# Patient Record
Sex: Female | Born: 1940 | Race: White | Hispanic: No | Marital: Married | Smoking: Former smoker
Health system: Southern US, Community
[De-identification: ages and names within clinical notes are randomized; demographics above are authoritative.]

## PROBLEM LIST (undated history)

## (undated) DIAGNOSIS — N189 Chronic kidney disease, unspecified: Secondary | ICD-10-CM

## (undated) DIAGNOSIS — H269 Unspecified cataract: Secondary | ICD-10-CM

## (undated) DIAGNOSIS — M1990 Unspecified osteoarthritis, unspecified site: Secondary | ICD-10-CM

## (undated) DIAGNOSIS — I429 Cardiomyopathy, unspecified: Secondary | ICD-10-CM

## (undated) DIAGNOSIS — I619 Nontraumatic intracerebral hemorrhage, unspecified: Secondary | ICD-10-CM

## (undated) DIAGNOSIS — F329 Major depressive disorder, single episode, unspecified: Secondary | ICD-10-CM

## (undated) DIAGNOSIS — I8390 Asymptomatic varicose veins of unspecified lower extremity: Secondary | ICD-10-CM

## (undated) DIAGNOSIS — I5032 Chronic diastolic (congestive) heart failure: Secondary | ICD-10-CM

## (undated) DIAGNOSIS — I10 Essential (primary) hypertension: Secondary | ICD-10-CM

## (undated) DIAGNOSIS — D689 Coagulation defect, unspecified: Secondary | ICD-10-CM

## (undated) DIAGNOSIS — E119 Type 2 diabetes mellitus without complications: Secondary | ICD-10-CM

## (undated) DIAGNOSIS — R410 Disorientation, unspecified: Secondary | ICD-10-CM

## (undated) DIAGNOSIS — F32A Depression, unspecified: Secondary | ICD-10-CM

## (undated) DIAGNOSIS — N952 Postmenopausal atrophic vaginitis: Secondary | ICD-10-CM

## (undated) DIAGNOSIS — I509 Heart failure, unspecified: Secondary | ICD-10-CM

## (undated) DIAGNOSIS — J449 Chronic obstructive pulmonary disease, unspecified: Secondary | ICD-10-CM

## (undated) DIAGNOSIS — R350 Frequency of micturition: Secondary | ICD-10-CM

## (undated) DIAGNOSIS — E669 Obesity, unspecified: Secondary | ICD-10-CM

## (undated) DIAGNOSIS — G4730 Sleep apnea, unspecified: Secondary | ICD-10-CM

## (undated) DIAGNOSIS — E079 Disorder of thyroid, unspecified: Secondary | ICD-10-CM

## (undated) DIAGNOSIS — N3946 Mixed incontinence: Secondary | ICD-10-CM

## (undated) DIAGNOSIS — J9621 Acute and chronic respiratory failure with hypoxia: Secondary | ICD-10-CM

## (undated) HISTORY — DX: Frequency of micturition: R35.0

## (undated) HISTORY — DX: Unspecified cataract: H26.9

## (undated) HISTORY — DX: Mixed incontinence: N39.46

## (undated) HISTORY — PX: OTHER SURGICAL HISTORY: SHX169

## (undated) HISTORY — DX: Coagulation defect, unspecified: D68.9

## (undated) HISTORY — DX: Cardiomyopathy, unspecified: I42.9

## (undated) HISTORY — DX: Postmenopausal atrophic vaginitis: N95.2

## (undated) HISTORY — DX: Chronic kidney disease, unspecified: N18.9

## (undated) HISTORY — DX: Asymptomatic varicose veins of unspecified lower extremity: I83.90

## (undated) HISTORY — DX: Obesity, unspecified: E66.9

## (undated) HISTORY — DX: Disorientation, unspecified: R41.0

## (undated) HISTORY — DX: Depression, unspecified: F32.A

## (undated) HISTORY — DX: Acute and chronic respiratory failure with hypoxia: J96.21

## (undated) HISTORY — DX: Chronic obstructive pulmonary disease, unspecified: J44.9

## (undated) HISTORY — PX: APPENDECTOMY: SHX54

## (undated) HISTORY — DX: Nontraumatic intracerebral hemorrhage, unspecified: I61.9

## (undated) HISTORY — PX: JOINT REPLACEMENT: SHX530

## (undated) HISTORY — DX: Unspecified osteoarthritis, unspecified site: M19.90

## (undated) HISTORY — DX: Sleep apnea, unspecified: G47.30

## (undated) HISTORY — PX: STRABISMUS SURGERY: SHX218

## (undated) HISTORY — DX: Major depressive disorder, single episode, unspecified: F32.9

## (undated) HISTORY — PX: TUBAL LIGATION: SHX77

## (undated) HISTORY — DX: Essential (primary) hypertension: I10

## (undated) HISTORY — DX: Disorder of thyroid, unspecified: E07.9

## (undated) HISTORY — DX: Chronic diastolic (congestive) heart failure: I50.32

## (undated) HISTORY — PX: TONSILLECTOMY: SUR1361

## (undated) HISTORY — DX: Type 2 diabetes mellitus without complications: E11.9

---

## 2005-01-25 ENCOUNTER — Ambulatory Visit (HOSPITAL_COMMUNITY): Admission: RE | Admit: 2005-01-25 | Discharge: 2005-01-25 | Payer: Self-pay | Admitting: Physician Assistant

## 2010-05-21 ENCOUNTER — Emergency Department: Payer: Self-pay | Admitting: Emergency Medicine

## 2011-03-05 ENCOUNTER — Encounter (HOSPITAL_COMMUNITY)
Admission: RE | Admit: 2011-03-05 | Discharge: 2011-03-05 | Disposition: A | Discharge status: Home / Self Care | Payer: Medicare Other | Source: Ambulatory Visit | Attending: Orthopedic Surgery | Admitting: Orthopedic Surgery

## 2011-03-05 LAB — BASIC METABOLIC PANEL
GFR calc non Af Amer: 51 mL/min — ABNORMAL LOW (ref >60)
Potassium: 4 mEq/L (ref 3.5–5.1)
Sodium: 139 mEq/L (ref 135–145)

## 2011-03-05 LAB — APTT: aPTT: 26 seconds (ref 24–37)

## 2011-03-05 LAB — URINALYSIS, ROUTINE W REFLEX MICROSCOPIC
Leukocytes, UA: NEGATIVE
Nitrite: POSITIVE — AB
Specific Gravity, Urine: 1.018 (ref 1.005–1.030)
pH: 7 (ref 5.0–8.0)

## 2011-03-05 LAB — URINE MICROSCOPIC-ADD ON

## 2011-03-05 LAB — ABO/RH: ABO/RH(D): O POS

## 2011-03-05 LAB — CBC
Platelets: 254 10*3/uL (ref 150–400)
RDW: 12.7 % (ref 11.5–15.5)
WBC: 9.8 10*3/uL (ref 4.0–10.5)

## 2011-03-05 LAB — SURGICAL PCR SCREEN: MRSA, PCR: NEGATIVE

## 2011-03-05 LAB — TYPE AND SCREEN

## 2011-03-09 ENCOUNTER — Ambulatory Visit (HOSPITAL_COMMUNITY)
Admission: RE | Admit: 2011-03-09 | Discharge: 2011-03-09 | Disposition: A | Discharge status: Home / Self Care | Payer: Medicare Other | Source: Ambulatory Visit | Attending: Orthopedic Surgery | Admitting: Orthopedic Surgery

## 2011-03-09 ENCOUNTER — Inpatient Hospital Stay (HOSPITAL_COMMUNITY): Payer: Medicare Other

## 2011-03-09 ENCOUNTER — Other Ambulatory Visit (HOSPITAL_COMMUNITY): Payer: Self-pay | Admitting: Orthopedic Surgery

## 2011-03-09 ENCOUNTER — Inpatient Hospital Stay (HOSPITAL_COMMUNITY)
Admission: RE | Admit: 2011-03-09 | Discharge: 2011-03-10 | DRG: 483 | Disposition: A | Discharge status: Home Health | Payer: Medicare Other | Source: Ambulatory Visit | Attending: Orthopedic Surgery | Admitting: Orthopedic Surgery

## 2011-03-09 DIAGNOSIS — I509 Heart failure, unspecified: Secondary | ICD-10-CM | POA: Diagnosis present

## 2011-03-09 DIAGNOSIS — J449 Chronic obstructive pulmonary disease, unspecified: Secondary | ICD-10-CM | POA: Diagnosis present

## 2011-03-09 DIAGNOSIS — M87029 Idiopathic aseptic necrosis of unspecified humerus: Secondary | ICD-10-CM

## 2011-03-09 DIAGNOSIS — X58XXXS Exposure to other specified factors, sequela: Secondary | ICD-10-CM

## 2011-03-09 DIAGNOSIS — Z01811 Encounter for preprocedural respiratory examination: Secondary | ICD-10-CM

## 2011-03-09 DIAGNOSIS — Z01812 Encounter for preprocedural laboratory examination: Secondary | ICD-10-CM

## 2011-03-09 DIAGNOSIS — Z6841 Body Mass Index (BMI) 40.0 and over, adult: Secondary | ICD-10-CM

## 2011-03-09 DIAGNOSIS — Z79899 Other long term (current) drug therapy: Secondary | ICD-10-CM

## 2011-03-09 DIAGNOSIS — J4489 Other specified chronic obstructive pulmonary disease: Secondary | ICD-10-CM | POA: Diagnosis present

## 2011-03-09 DIAGNOSIS — S42309S Unspecified fracture of shaft of humerus, unspecified arm, sequela: Secondary | ICD-10-CM

## 2011-03-09 DIAGNOSIS — IMO0002 Reserved for concepts with insufficient information to code with codable children: Secondary | ICD-10-CM | POA: Diagnosis present

## 2011-03-10 LAB — BASIC METABOLIC PANEL
BUN: 9 mg/dL (ref 6–23)
Calcium: 8.3 mg/dL — ABNORMAL LOW (ref 8.4–10.5)
Creatinine, Ser: 0.85 mg/dL (ref 0.4–1.2)
GFR calc non Af Amer: >60 mL/min (ref >60)
Potassium: 4.5 mEq/L (ref 3.5–5.1)

## 2011-03-10 LAB — CBC
MCV: 95.4 fL (ref 78.0–100.0)
Platelets: 195 10*3/uL (ref 150–400)
RDW: 12.6 % (ref 11.5–15.5)
WBC: 12.3 10*3/uL — ABNORMAL HIGH (ref 4.0–10.5)

## 2011-03-11 NOTE — Op Note (Signed)
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  PROCEDURE PERFORMED:  Right shoulder hemiarthroplasty.  ATTENDING SURGEON:  Jones Broom, MD  ASSISTANTS: 1. Eulas Post, MD 2. Jason Coop, PA-C  ANESTHESIA:  GETA.  COMPLICATIONS:  None.  DRAINS:  One medium Hemovac.  SPECIMENS:  None.  ESTIMATED BLOOD LOSS:  400 mL.  INDICATIONS FOR SURGERY:  The patient is a 69 year old female who suffered a right proximal humerus fracture and was treated nonsurgically.  She went onto have malunion with AVN of the humeral head and had chronic pain, disability.  Her main issue was pain.  She also noted decreased function of the arm.  She was interested in going forward with surgical treatment, try and decrease her pain, and increase function if at all possible.  She understood risks, benefits, and alternatives of the procedure including but not limited to risk of bleeding, infection, damage to neurovascular structures, risk of incomplete pain relief, and potential need for future surgery.  She elected to go forward with surgery.  OPERATIVE FINDINGS:  Examination under anesthesia demonstrated range of motion to -10 degrees external rotation, forward flexion to about 80 degrees.  The lesser tuberosity osteotomy was performed, and the head was resected.  Decision was made to leave the greater tuberosity in its current position despite  its slightly posterior position.  It was felt that osteotomy would lead to potential further complications and that tuberosities were healed and in acceptable position.  A 12 stem with a 44 x 18 head with a 135-degree neck was placed in about 35 degrees retroversion.  Good stability was noted.  The lesser tuberosity osteotomy was repaired at the conclusion of the case under some tension and at the conclusion of the case, her external rotation was to about zero.  The rotator cuff was completely intact.  PROCEDURE:  The patient was identified in the preoperative holding area where I personally marked the operative site after verifying site, side, and procedure with the patient.  She was taken back to the operating room where general anesthesia was induced without complication.  She was placed in the beach-chair position with the head, neck, and all extremities carefully padded and positioned.  Right upper extremity was then prepped and draped in a standard sterile fashion.  The patient did receive 2 g IV Ancef prior to commencement of the procedure.  The appropriate time-out procedure was carried out by myself, the operative staff, and anesthesia staff all verifying site, side, and procedure. The right upper extremity was then prepped and draped in a standard sterile fashion.  A sterile arm holder was  used.  Dr. Dion Saucier also assisted throughout the procedure, and retraction and positioning was imperative to be able to carry out this procedure.  An approximately 10- cm incision was made from the coracoid tip to the midhumeral level at the level of the axilla.  Dissection was carried down through subcutaneous tissues to the level of the cephalic vein, which was taken laterally with the deltoid.  The underlying conjoined tendon was identified.  The subdeltoid space was elevated into the subacromial space, and Cobra retractor was used to bring the deltoid laterally and the pectoralis major  medially.  The upper 1-cm pectoralis major was taken down for exposure.  The lateral border of the conjoined tendon was developed and by finger palpation, I was able to palpate the axillary nerve, which was felt to be in continuity.  Musculocutaneous nerve was not palpated in the operative field.  The Cobra retractor was then placed under the conjoined tendon.  The circumflex humeral vessels at the inferior border of the subscapularis were identified and coagulated. The arm was then internally rotated, biceps tendon was identified and traced into the joint.  It was resected off the supraglenoid tubercle and then was tenodesed to the pectoralis major using #1 Ethibond with two figure-of-eight sutures.  The remainder of the tendon was then cut and discarded.  The bicipital groove was developed, and a large curved osteotome was used to take an osteotomy of the lesser tuberosity with about 8-mm thickness.  The capsulotomy was then advanced around medially exposing the medial shaft.  This was all done taking great care to protect the axillary nerve with retractors.  The joint was then dislocated and a combination of a Cobb and osteotome were used to remove the valgus depressed head.  This was mostly dead bone.  At this point, the canal was established using a reamer and sequentially reamed up from 6-12 mm, which had appropriate cortical contact and was felt to be appropriate size.  A 12 stem was then placed as a trial and several heads were tried to try and determine the appropriate head size.  The 44 x 18 head was felt to be the most appropriate fitting head.  Its height was estimated based on the position of the tuberosity as well as measuring from the upper border of the pectoralis major.  It was placed in about 35 degrees of retroversion.  The joint was reduced and was felt to be in appropriate position.  Therefore, the canal was prepared with pulse lavage and the implant was cemented into  place with about 35 degrees of retroversion.  A 44 x 18 eccentric head was used.  Height was felt to be appropriate.  The joint was then reduced and copiously irrigated with normal saline.  The lesser tuberosity osteotomy was then advanced.  It was noted that it was under quite a bit of tension, therefore, a Cobb elevator as well as finger palpation and some sharp dissection was used to completely free up the subscapularis allowing as much excursion as possible.  It was able to be reduced over the front of the implant to the osteotomy site.  It was under some tension.  The arm was held in internal rotation to allow repair.  The lesser tuberosity osteotomy was then repaired using three #2 fiber wires around the bone tendon junction and through bone tunnels on the greater tuberosity. Anatomic repair was achieved.  One #1 Ethibond was used, and the rotator interval in a figure-of-eight fashion.  At  this point, the rotation was tested and she could come out to neutral external rotation comfortably, but not beyond without significant stress on the repair.  A drain was placed at the subdeltoid space, and the wound again was copiously irrigated with pulse lavage.  It was then closed in layers with 2-0 Vicryl in deep dermal layer, 4-0 Monocryl for skin closure.  Sterile dressings were applied including Steri-Strips, 4 x 4s, ABDs, and tape. The patient was placed in a sling, allowed to awaken from general anesthesia, transferred to stretcher, and taken to the recovery room in stable condition.  POSTOPERATIVE PLAN:  She will be kept overnight and likely two nights for pain control.  She will work on gentle hand, wrist, elbow motion, otherwise will stay in the sling.  She will have Percocet and PCA postoperatively for pain control.     Jones Broom, MD     JC/MEDQ  D:  03/09/2011  T:  03/10/2011  Job:  [id number]  Electronically Signed by Jones Broom  on 03/11/2011 01:19:45 PM

## 2011-04-05 NOTE — Discharge Summary (Signed)
  [name], Sara Pierce                ACCOUNT NO.:  [id number]  MEDICAL RECORD NO.:  [id number]           PATIENT TYPE:  I  LOCATION:  5003                         FACILITY:  [facility]  PHYSICIAN:  Jones Broom, MD    DATE OF BIRTH:  05/01/1941  DATE OF ADMISSION:  03/09/2011 DATE OF DISCHARGE:  03/10/2011                              DISCHARGE SUMMARY   FINAL DIAGNOSES:  Status post right shoulder hemiarthroplasty for proximal humeral malunion with avascular necrosis.  HOSPITAL COURSE:  The patient was admitted March 09, 2011 after undergoing right shoulder hemiarthroplasty.  She recovered well on the floor postoperatively.  Her pain was initially controlled with PCA, subsequently with oral analgesia.  She was seen and evaluated by occupational therapy who worked with her on hand, wrist, elbow motion only.  Her drain was discontinued postoperative day #1.  She was neurovascularly intact at the time of discharge and vital signs were stable.  On March 10, 2011, she was discharged home in stable condition. Her husband was with her.  DISCHARGE INSTRUCTIONS:  She will discontinue dressing postoperative day #2 and can shower postoperative day #5.  She will be discharged with Percocet for pain control.  She will work on hand, wrist, elbow motion only, otherwise will remain in her sling.  She will follow up in 10-14 days for wound check.  She will call or return sooner with any problems or concerns.     Jones Broom, MD     JC/MEDQ  D:  04/02/2011  T:  04/02/2011  Job:  [id number]  Electronically Signed by Jones Broom  on 04/05/2011 01:50:38 PM

## 2011-09-12 ENCOUNTER — Emergency Department: Payer: Self-pay | Admitting: Emergency Medicine

## 2011-10-02 ENCOUNTER — Inpatient Hospital Stay: Payer: Self-pay | Admitting: Internal Medicine

## 2011-10-07 ENCOUNTER — Emergency Department: Payer: Self-pay | Admitting: Internal Medicine

## 2011-12-29 IMAGING — CT CT OF THE RIGHT SHOULDER WITHOUT CONTRAST
2 series · 15 of 27 positions shown, 19 images · non-contrast
Comparison: None

REASON FOR EXAM: right shoulder fx - please do 3d reconstructions
COMMENTS:

PROCEDURE:     CT  - CT SHOULDER RIGHT WO  - May 21, 2010 [DATE]
RESULT:     History: Right shoulder fracture
TECHNIQUE: Multiple axial images obtained of the right shoulder with
sagittal and coronal reformatted images provided. 3-D reformatted images for
surface renderings were performed on a Siemens workstation.

[Series 3: shoulder 3mm · axial · 0.43mm/px · z∈[-85,+71]mm · 10 of 62 slices shown, 13 images]
[im 5/62  soft-tissue]
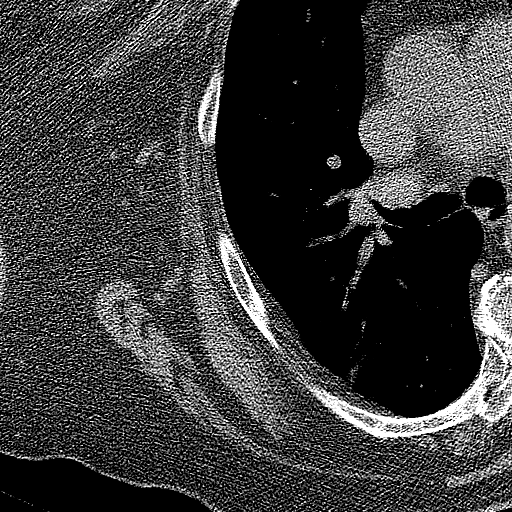
[im 5/62  bone]
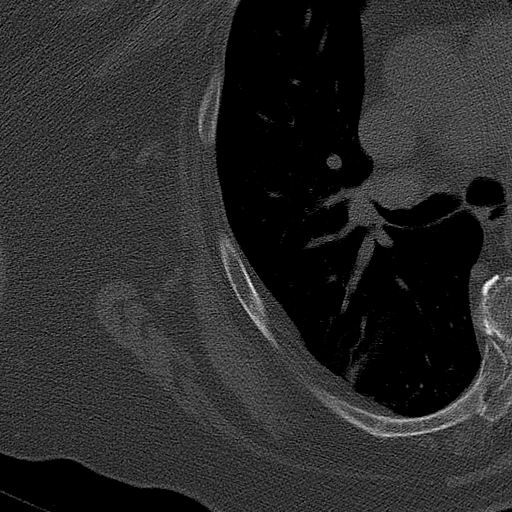
[im 10/62  bone]
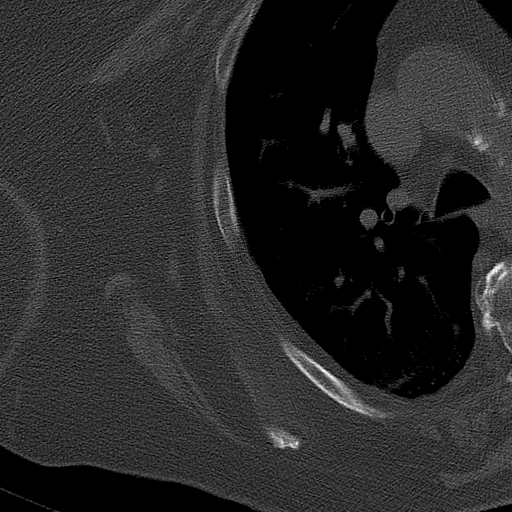
[im 19/62  bone]
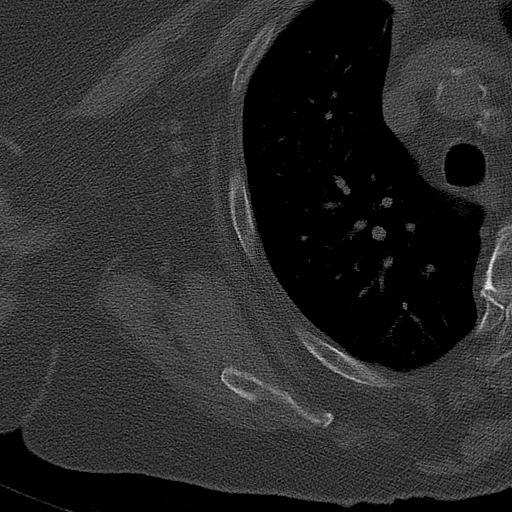
[im 24/62  bone]
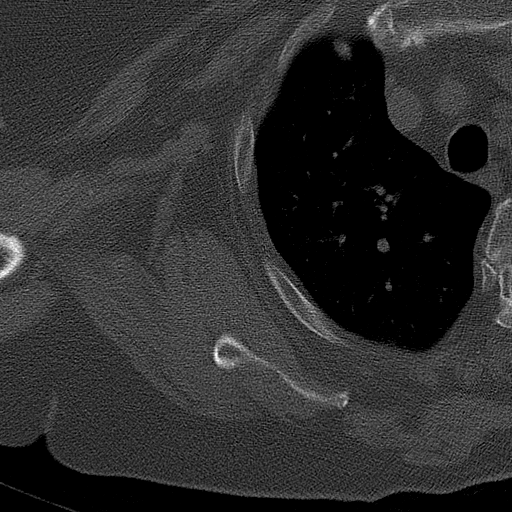
[im 29/62  soft-tissue]
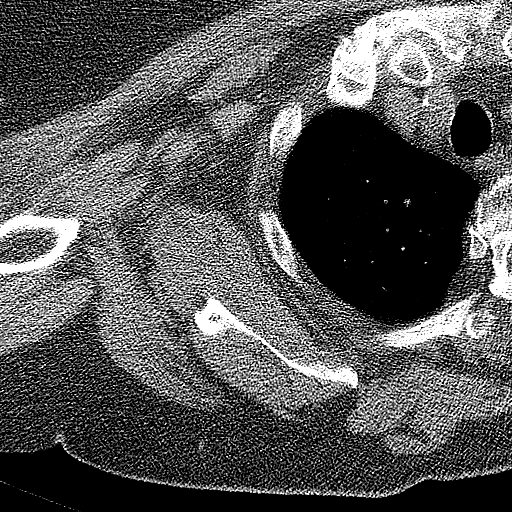
[im 29/62  bone]
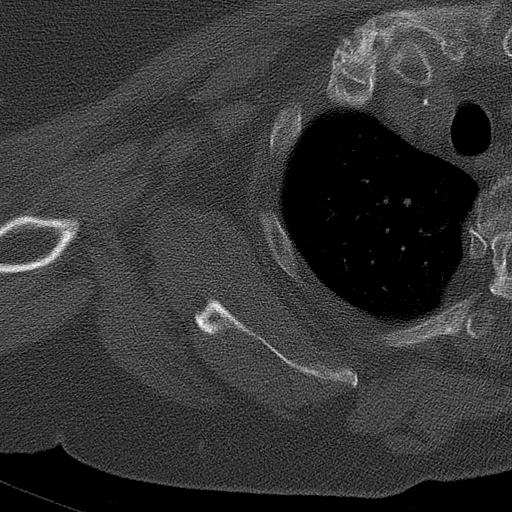
[im 33/62  bone]
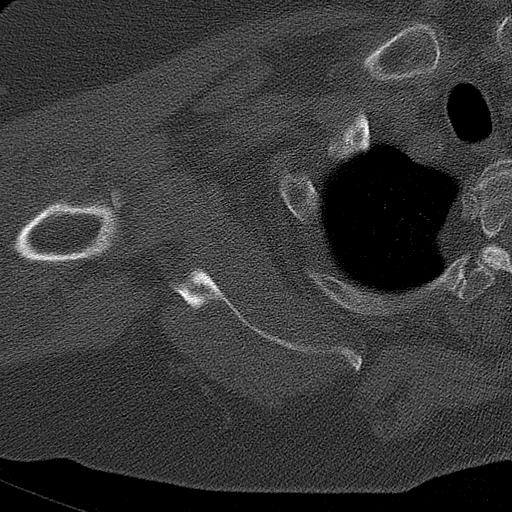
[im 38/62  bone]
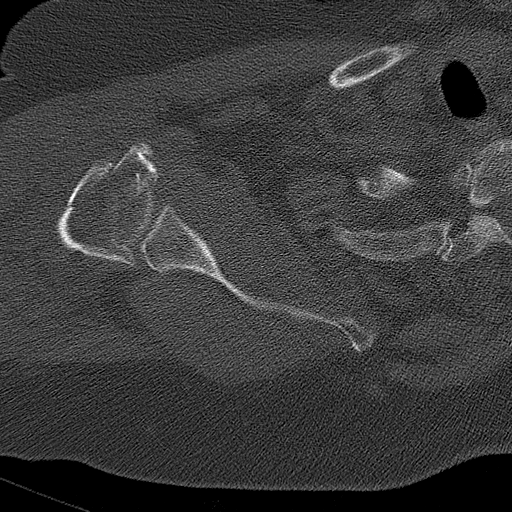
[im 47/62  bone]
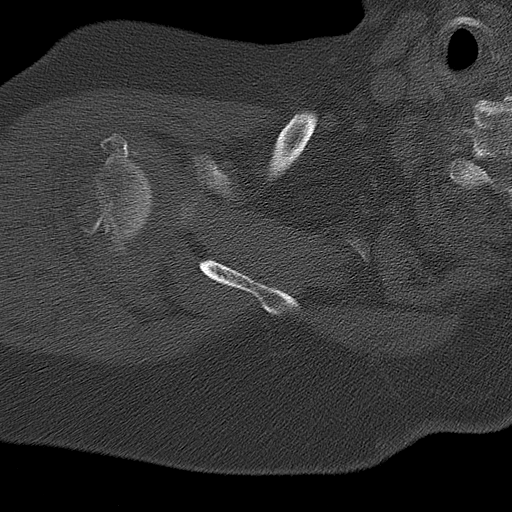
[im 52/62  soft-tissue]
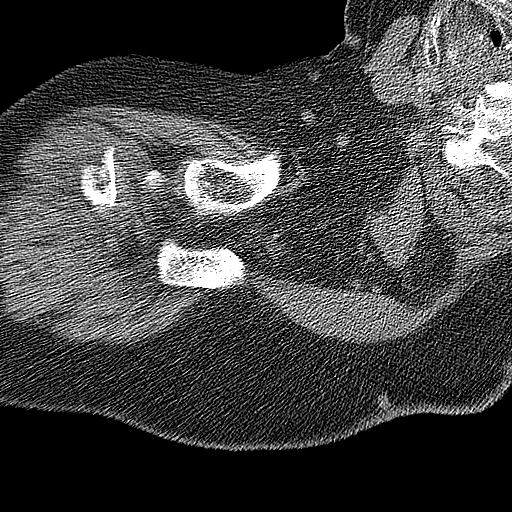
[im 52/62  bone]
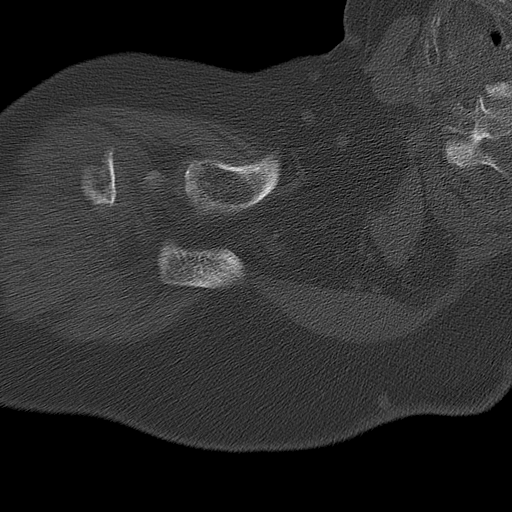
[im 57/62  bone]
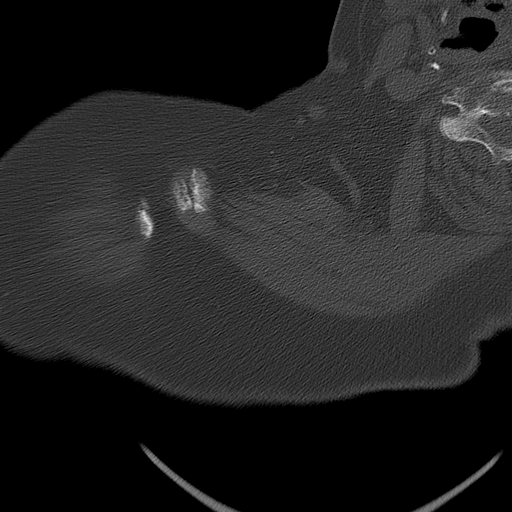

[Series 603: sags · sagittal · 0.45mm/px · 5 of 65 slices shown, 6 images]
[im 22/65  bone]
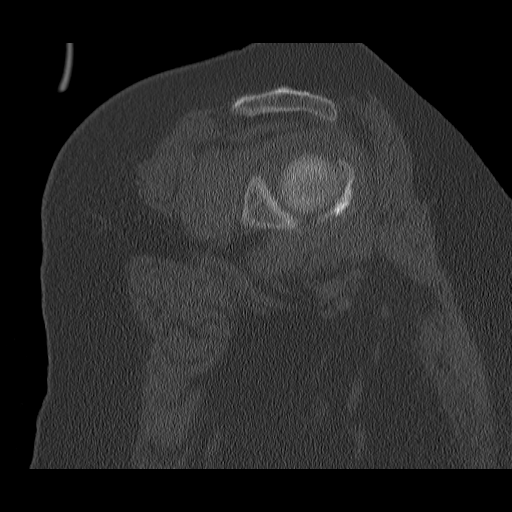
[im 27/65  bone]
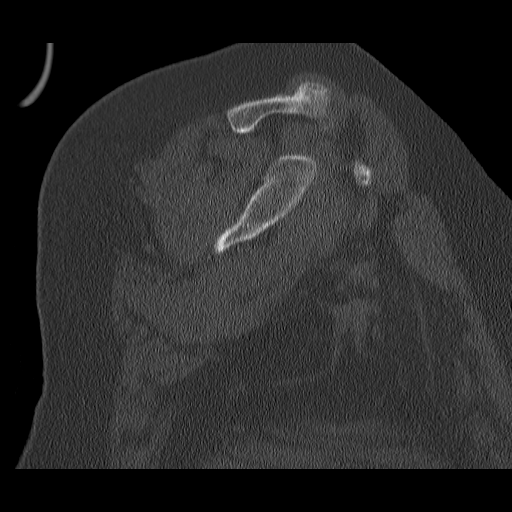
[im 33/65  soft-tissue]
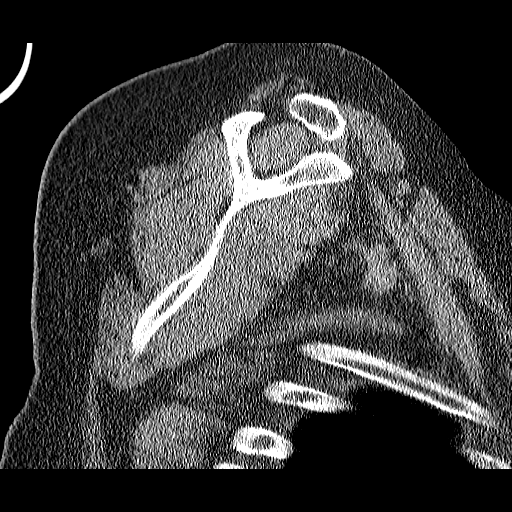
[im 33/65  bone]
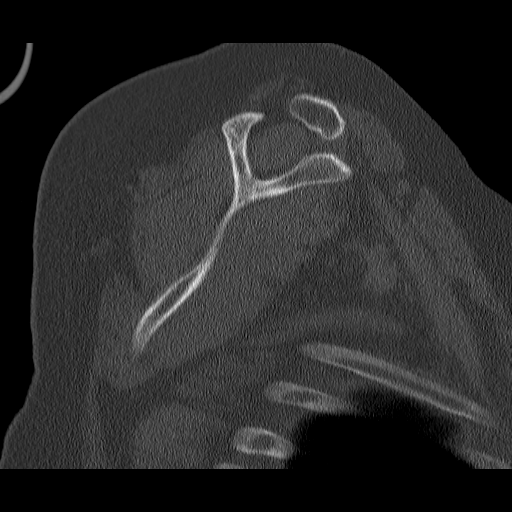
[im 38/65  bone]
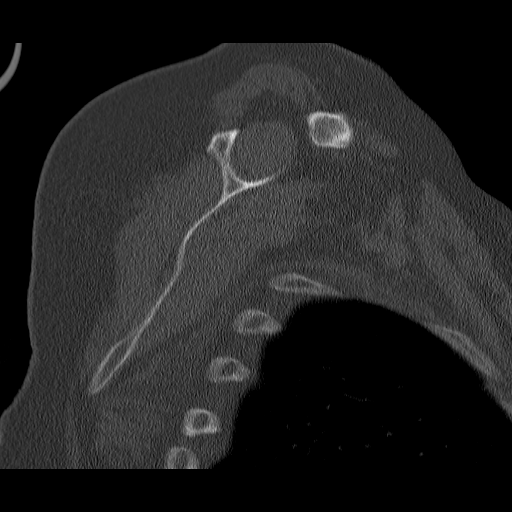
[im 43/65  bone]
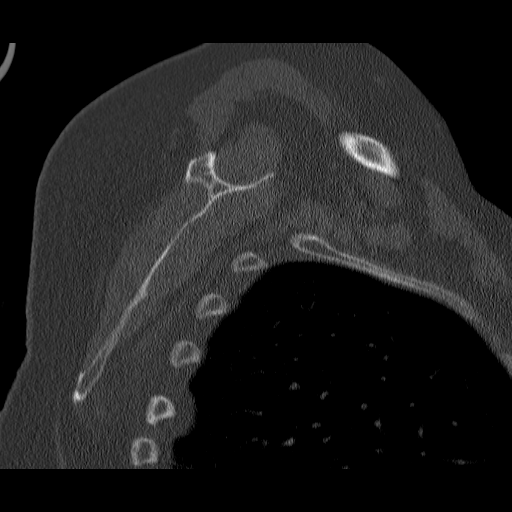

[15 of 27 positions shown; findings below may reference images not displayed]

FINDINGS: There is a comminuted fracture of the right proximal humerus involving the
surgical neck with fracture cleft extending into the greater tuberosity and
lesser tuberosity with mild displacement of the fracture fragments. There is
no glenohumeral dislocation. There is a small hemarthrosis. There are
degenerative changes of the acromioclavicular joint. There is no other
fracture visualized.

The visualized portions of the right lung demonstrate no focal parenchymal
opacity. There is no right pneumothorax.

The visualized portion of the right cervical spine demonstrates lower
cervical degenerative disc disease and right facet arthropathy.
IMPRESSION: Comminuted fracture of the right proximal humerus involving the surgical
neck with extension into the greater tuberosity and lesser tuberosity with
mild displacement of the fracture fragments. There is no glenohumeral
dislocation.

## 2011-12-29 IMAGING — CT CT HEAD WITHOUT CONTRAST
1 series · 1 of 1 positions shown · non-contrast
Comparison: none

REASON FOR EXAM: dizziness
COMMENTS:

PROCEDURE:     CT  - CT HEAD WITHOUT CONTRAST  - May 21, 2010 [DATE]
RESULT:     Comparison:  None
TECHNIQUE: Multiple axial images from the foramen magnum to the vertex were
obtained without IV contrast.

[Series 1: topogram 1.0 t20s · sagittal · 1.0mm · 1.00mm/px · 1 of 1 slices shown]
[im 1/1]
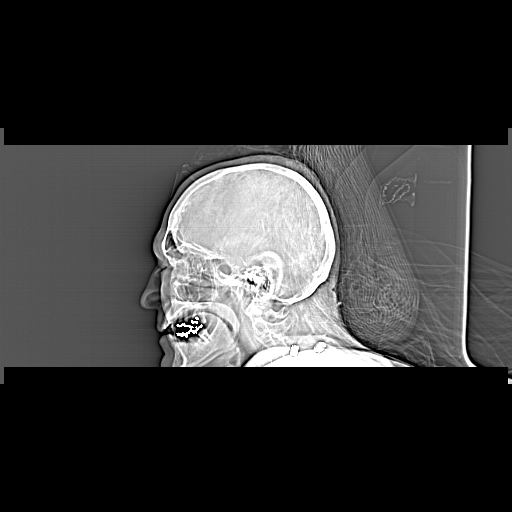

[1 of 1 positions shown; findings below may reference images not displayed]

FINDINGS: There is no evidence of mass effect, midline shift, or extra-axial fluid
collections.  There is no evidence of a space-occupying lesion or
intracranial hemorrhage. There is no evidence of a cortical-based area of
acute infarction. There is a small age indeterminate lacunar infarct in the
right pons. There is generalized cerebral atrophy. There is periventricular
white matter low attenuation likely secondary to microangiopathy.

The ventricles and sulci are appropriate for the patient's age. The basal
cisterns are patent.

Visualized portions of the orbits are unremarkable. The visualized portions
of the paranasal sinuses and mastoid air cells are unremarkable.
Cerebrovascular atherosclerotic calcifications are noted.

The osseous structures are unremarkable.
IMPRESSION: There is a small age indeterminate lacunar infarct in the right pons.
Otherwise no acute intracranial pathology.

CT can underestimate ischemia in the first 24 hours after the event. If
there is clinical concern for an acute infarct, a followup MRI or repeat CT
scan in 24 hours may provide additional information.

## 2012-03-01 ENCOUNTER — Emergency Department: Payer: Self-pay | Admitting: Emergency Medicine

## 2012-03-02 LAB — COMPREHENSIVE METABOLIC PANEL
Anion Gap: 13 (ref 7–16)
BUN: 31 mg/dL — ABNORMAL HIGH (ref 7–18)
Bilirubin,Total: 0.4 mg/dL (ref 0.2–1.0)
Creatinine: 1.75 mg/dL — ABNORMAL HIGH (ref 0.60–1.30)
EGFR (African American): 37 — ABNORMAL LOW
Glucose: 93 mg/dL (ref 65–99)
Osmolality: 282 (ref 275–301)
SGOT(AST): 19 U/L (ref 15–37)
SGPT (ALT): 15 U/L
Sodium: 138 mmol/L (ref 136–145)
Total Protein: 7.3 g/dL (ref 6.4–8.2)

## 2012-03-02 LAB — CBC
MCHC: 33.8 g/dL (ref 32.0–36.0)
Platelet: 241 10*3/uL (ref 150–440)
RDW: 12.9 % (ref 11.5–14.5)
WBC: 13.1 10*3/uL — ABNORMAL HIGH (ref 3.6–11.0)

## 2012-03-02 LAB — PRO B NATRIURETIC PEPTIDE: B-Type Natriuretic Peptide: 180 pg/mL — ABNORMAL HIGH (ref 0–125)

## 2012-03-02 LAB — TROPONIN I: Troponin-I: <0.02 ng/mL

## 2012-05-02 ENCOUNTER — Ambulatory Visit: Payer: Self-pay | Admitting: Family Medicine

## 2012-05-03 ENCOUNTER — Ambulatory Visit: Payer: Self-pay | Admitting: Gastroenterology

## 2012-05-03 ENCOUNTER — Ambulatory Visit: Payer: Self-pay | Admitting: Family Medicine

## 2012-05-05 LAB — PATHOLOGY REPORT

## 2012-10-16 IMAGING — CR DG CHEST 2V
2 series · 2 of 2 positions shown · non-contrast
Comparison: None

CLINICAL DATA: Avascular necrosis right shoulder, preoperative
assessment

CHEST - 2 VIEW

[view not recorded (1 of 2)]
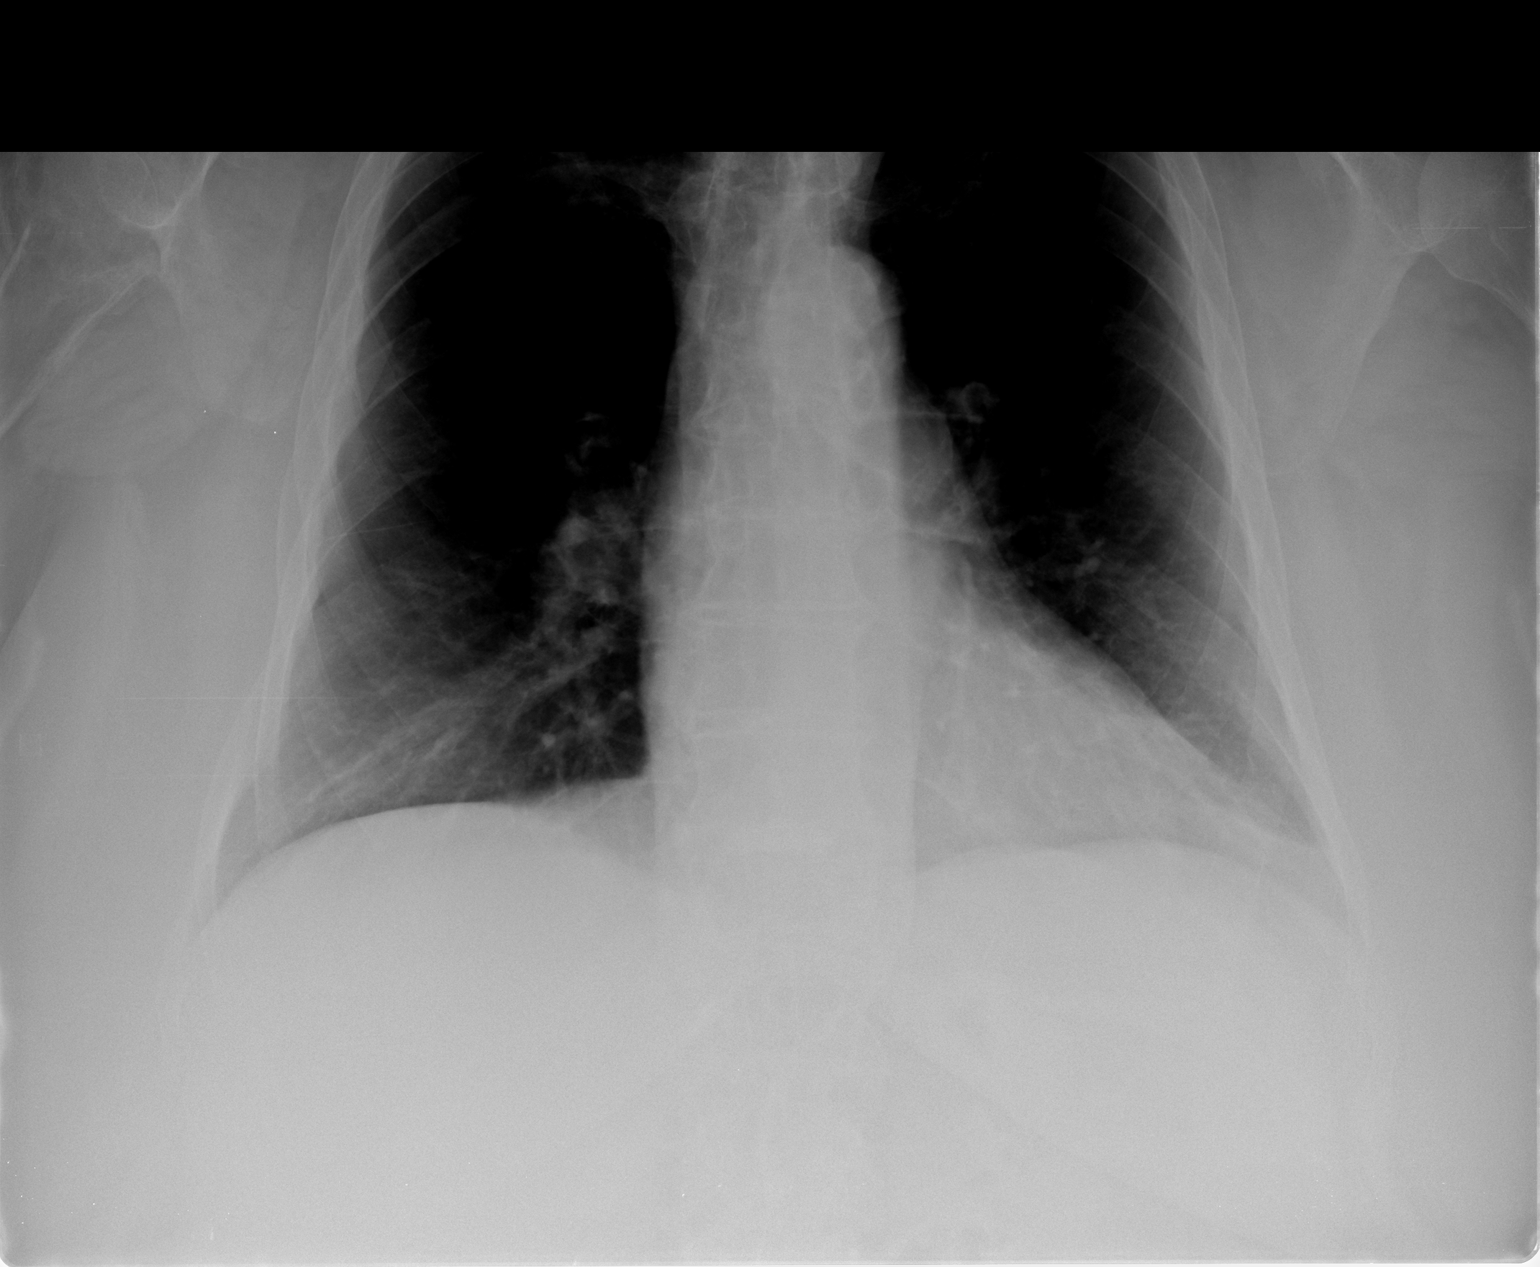

[view not recorded (2 of 2)]
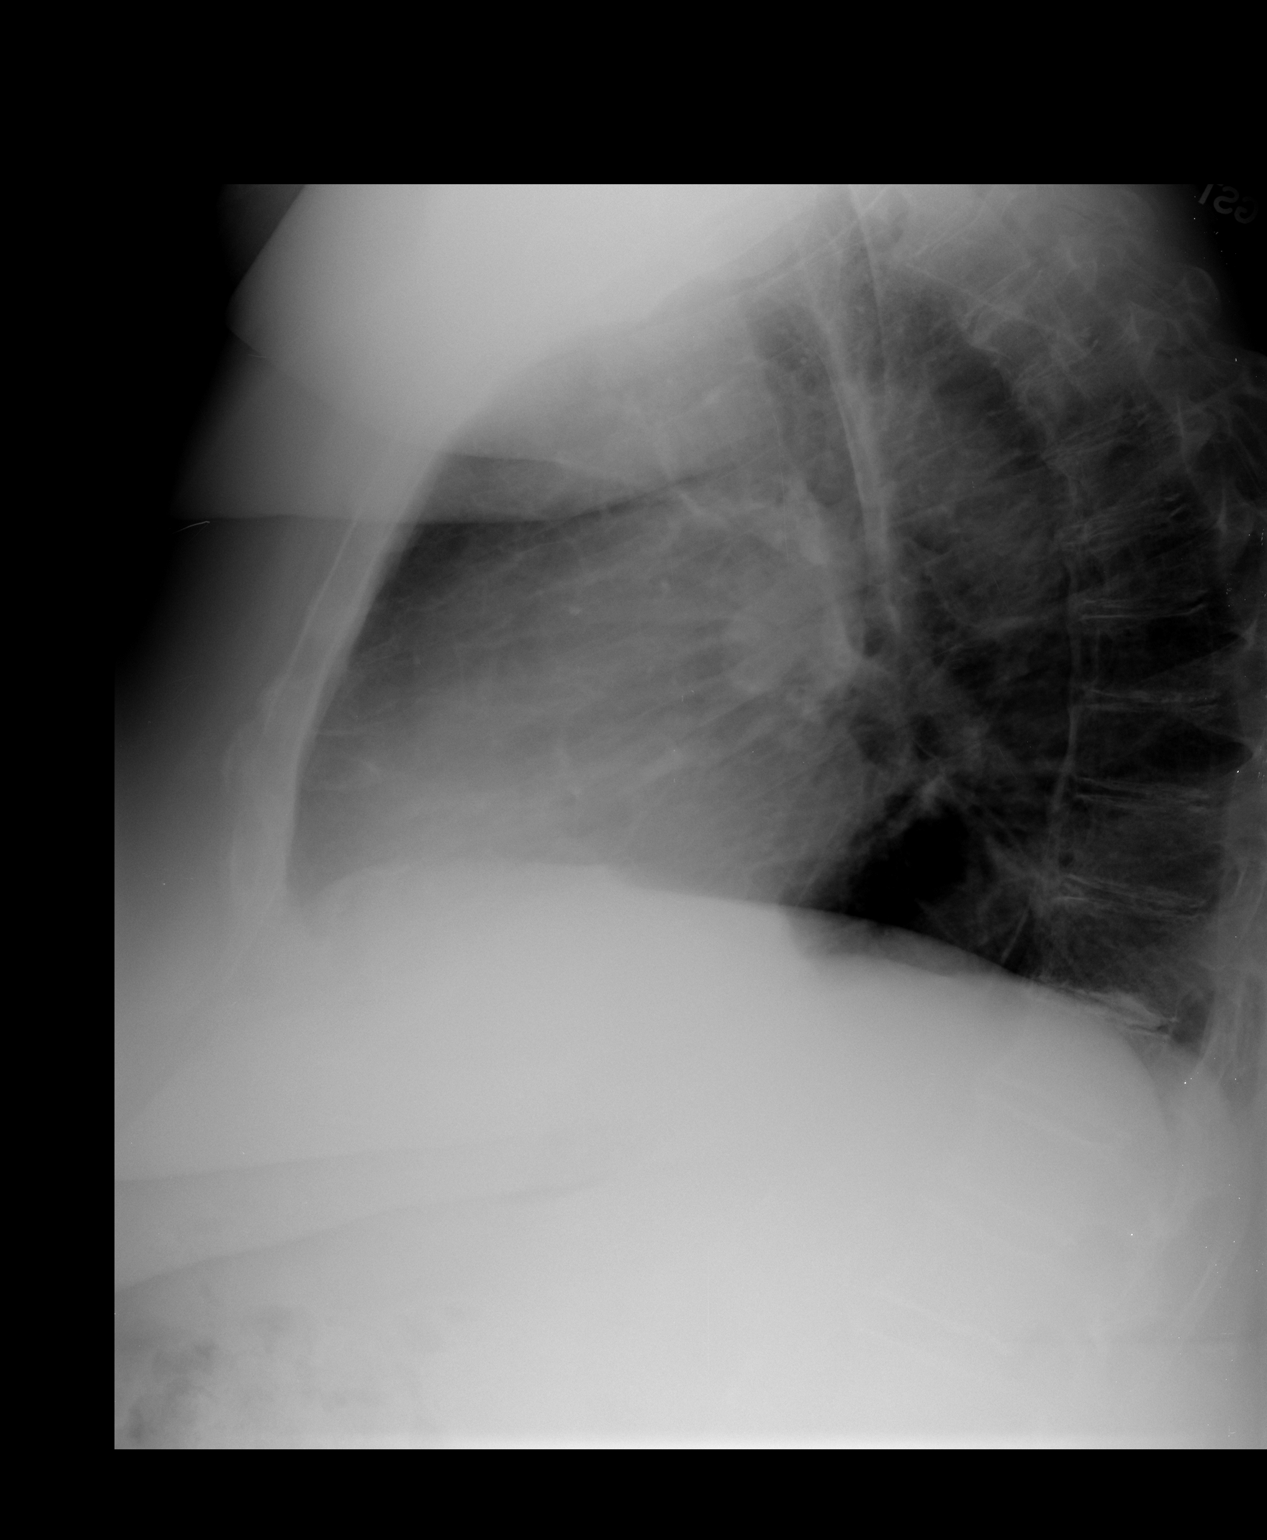

[2 of 2 positions shown; findings below may reference images not displayed]

FINDINGS: Over penetrated exam.
Upper normal heart size.
Normal mediastinal contours and pulmonary vascularity.
Lungs appear emphysematous but clear.
No gross effusion, pneumothorax or mass.
Bones appear demineralized.
Deformity of right humeral head, poorly assessed at margin of
image.
IMPRESSION: Probable COPD without definite acute process identified on an over
penetrated exam.

## 2012-10-16 IMAGING — CR DG SHOULDER 2+V PORT*R*
1 series · 1 of 1 positions shown · non-contrast
Comparison: Chest x-ray of 03/09/2011

CLINICAL DATA: Status post right shoulder arthroplasty

PORTABLE RIGHT SHOULDER - 2+ VIEW

[AP]
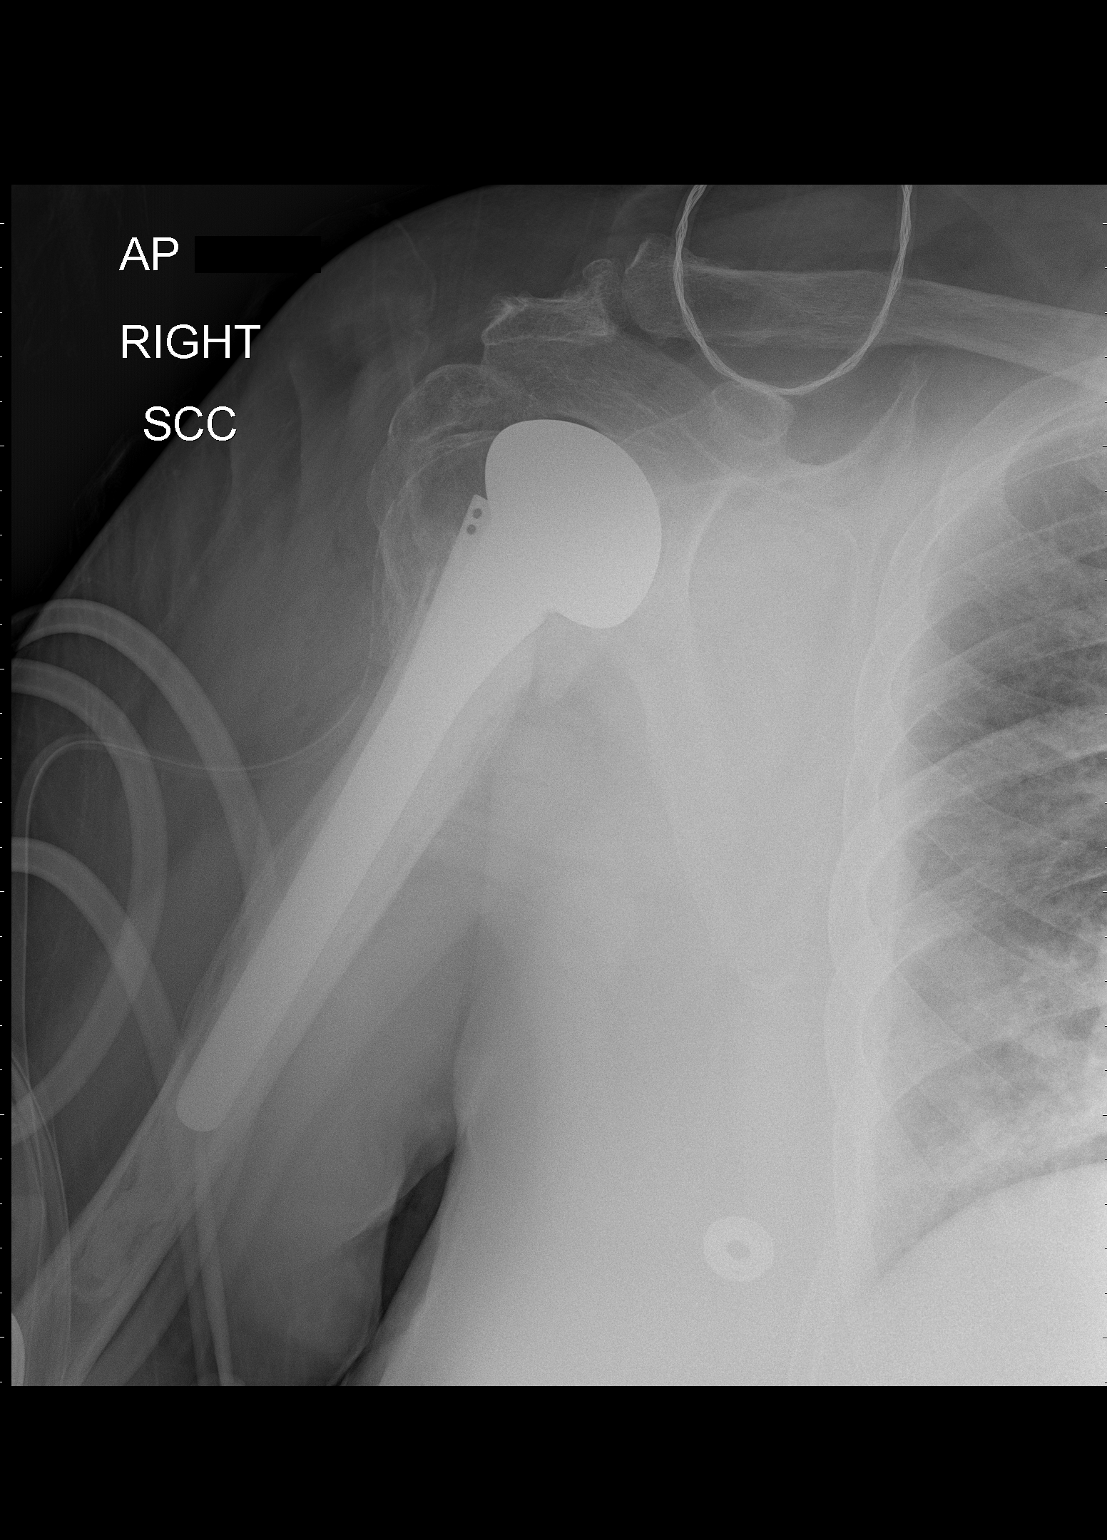

[1 of 1 positions shown; findings below may reference images not displayed]

FINDINGS: A prosthetic right humeral head is noted and appears to
be in good position.  Degenerative change of the right AC joint is
present.  No acute fracture is seen.
IMPRESSION: Right humeral prosthetic head in good position.  No acute
abnormality.

## 2012-11-23 ENCOUNTER — Emergency Department: Payer: Self-pay | Admitting: Emergency Medicine

## 2012-11-23 LAB — COMPREHENSIVE METABOLIC PANEL
Albumin: 3.6 g/dL (ref 3.4–5.0)
Anion Gap: 6 — ABNORMAL LOW (ref 7–16)
BUN: 26 mg/dL — ABNORMAL HIGH (ref 7–18)
Calcium, Total: 9 mg/dL (ref 8.5–10.1)
Chloride: 103 mmol/L (ref 98–107)
Co2: 29 mmol/L (ref 21–32)
EGFR (African American): 52 — ABNORMAL LOW
EGFR (Non-African Amer.): 45 — ABNORMAL LOW
Potassium: 4.2 mmol/L (ref 3.5–5.1)
SGOT(AST): 11 U/L — ABNORMAL LOW (ref 15–37)
SGPT (ALT): 15 U/L (ref 12–78)
Total Protein: 7.3 g/dL (ref 6.4–8.2)

## 2012-11-23 LAB — CBC
HCT: 33.8 % — ABNORMAL LOW (ref 35.0–47.0)
MCV: 95 fL (ref 80–100)
Platelet: 221 10*3/uL (ref 150–440)
RBC: 3.57 10*6/uL — ABNORMAL LOW (ref 3.80–5.20)
RDW: 12.8 % (ref 11.5–14.5)
WBC: 11.9 10*3/uL — ABNORMAL HIGH (ref 3.6–11.0)

## 2012-11-23 LAB — TROPONIN I: Troponin-I: <0.02 ng/mL

## 2013-02-21 ENCOUNTER — Ambulatory Visit: Payer: Self-pay | Admitting: Gastroenterology

## 2013-04-21 IMAGING — CR DG CHEST 2V
1 series · 3 of 3 positions shown · non-contrast
Comparison: none

REASON FOR EXAM: cough
COMMENTS:   May transport without cardiac monitor

PROCEDURE:     DXR - DXR CHEST PA (OR AP) AND LATERAL  - September 12, 2011  [DATE]
RESULT:     Comparison: None

[Series 1: view not recorded · 0.17mm/px · 3 of 3 slices shown]
[im 1/3]
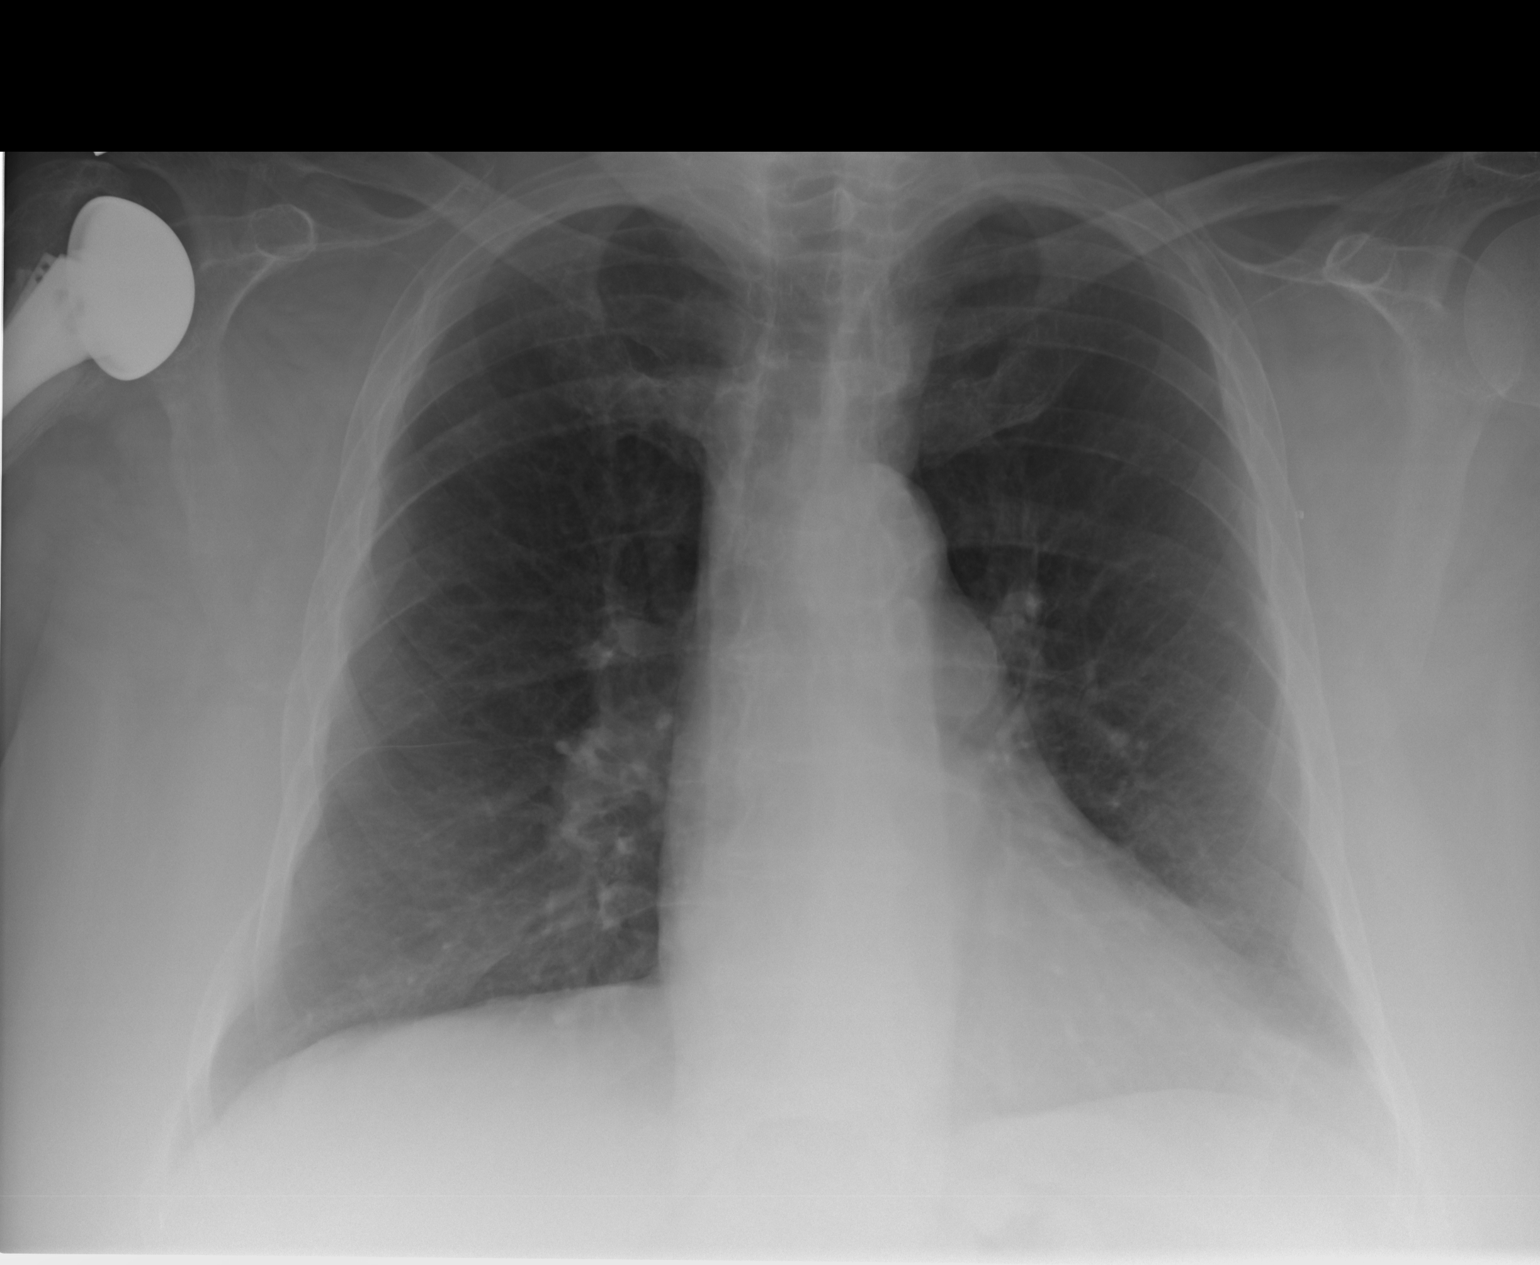
[im 2/3]
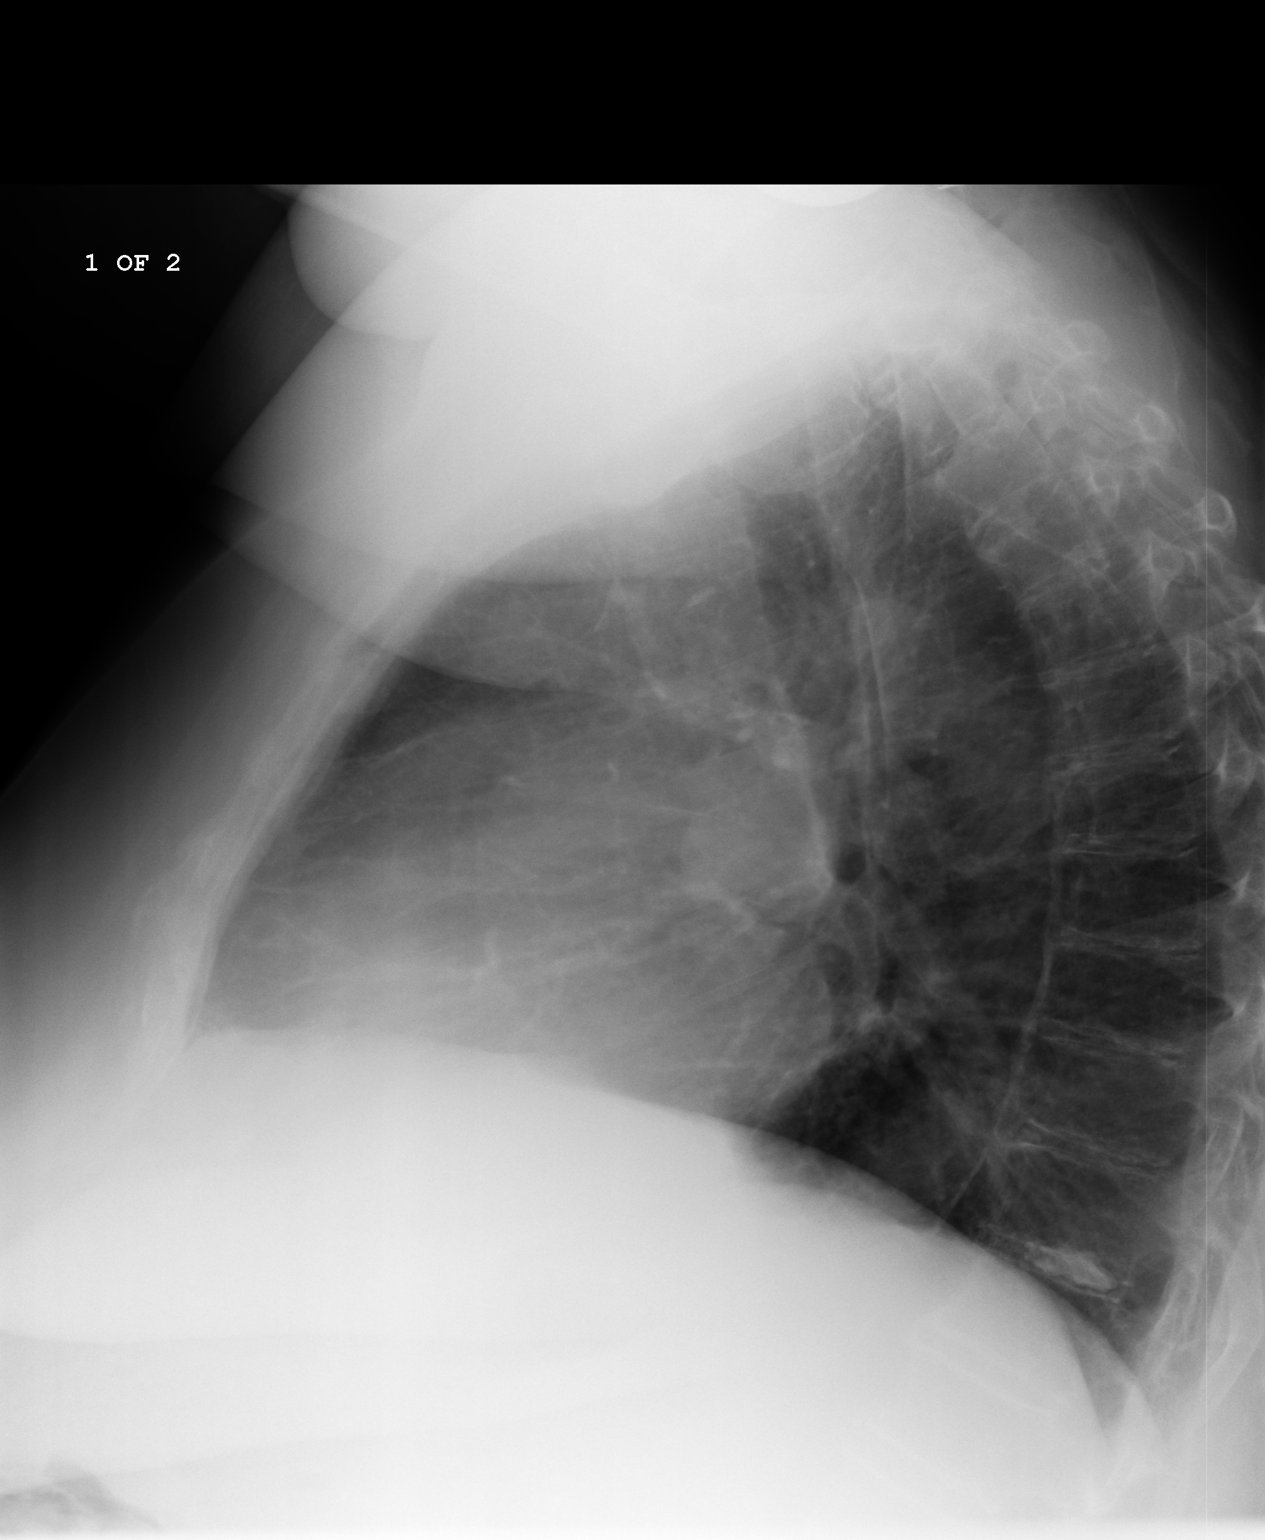
[im 3/3]
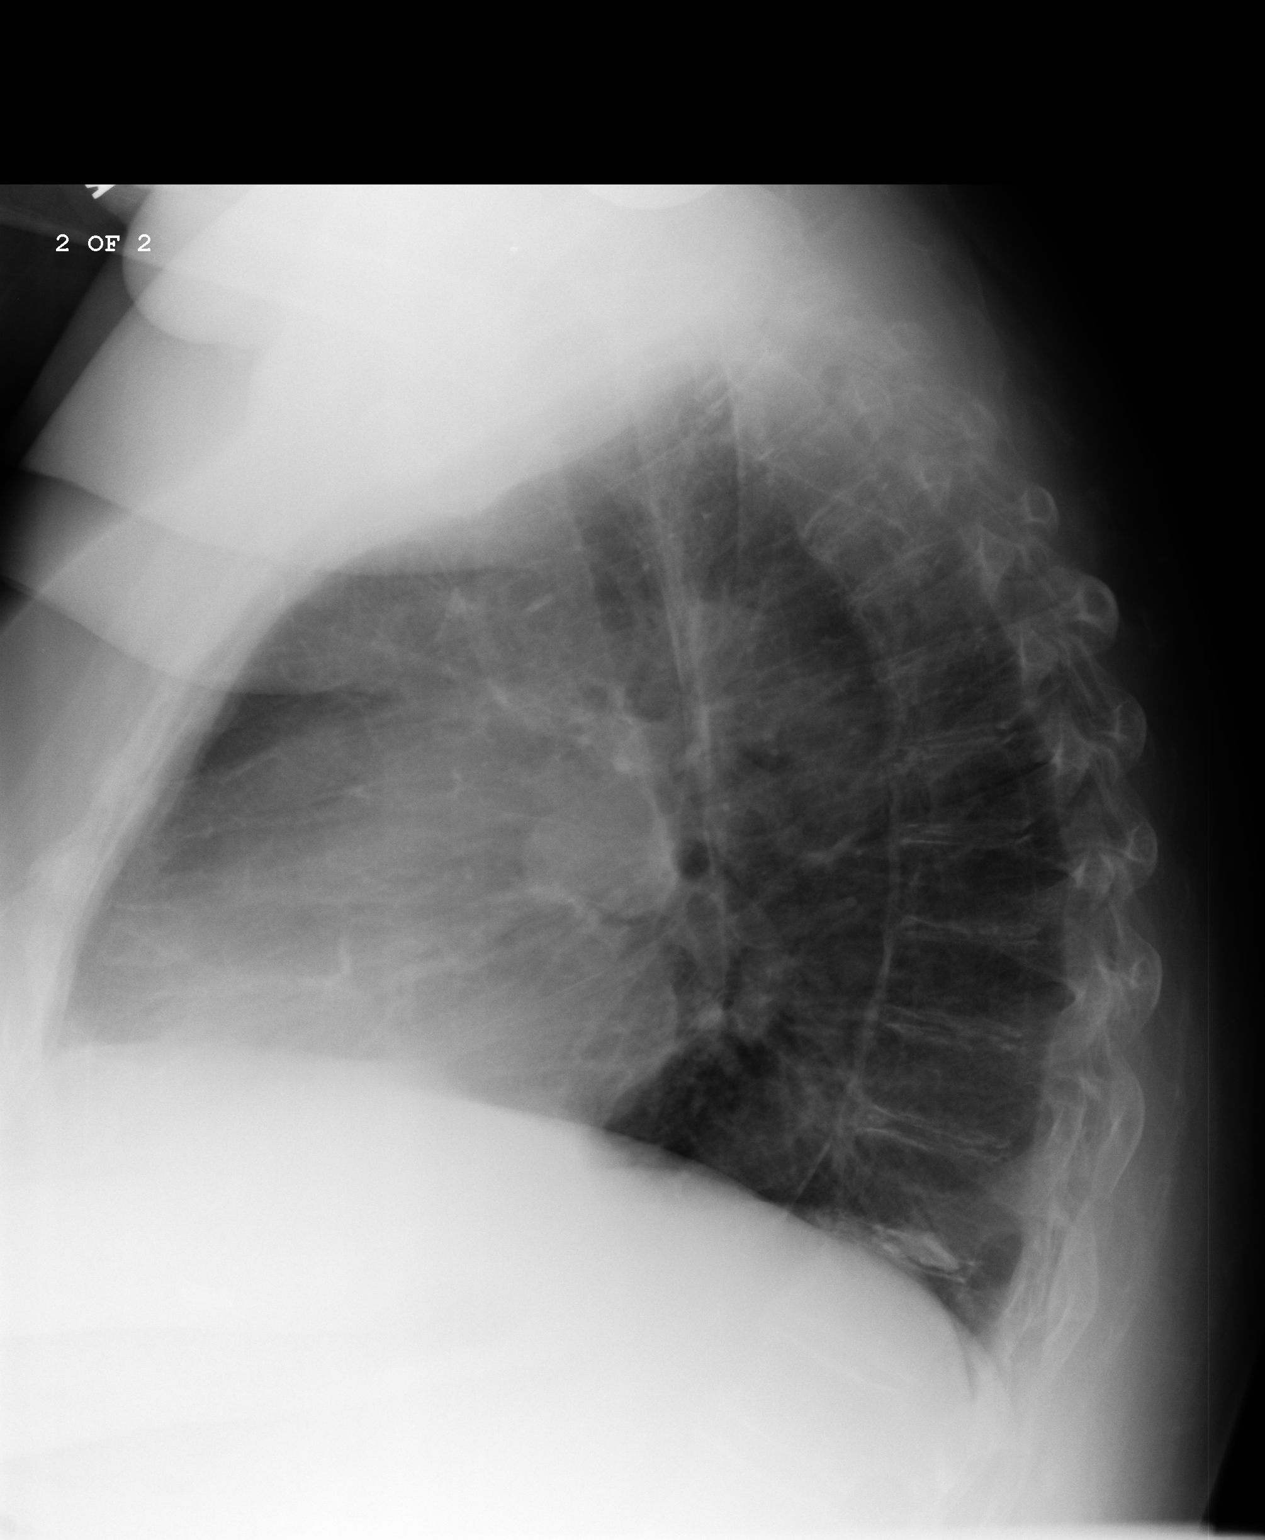

[3 of 3 positions shown; findings below may reference images not displayed]

FINDINGS: PA and lateral chest radiographs are provided.  There is no focal
parenchymal opacity, pleural effusion, or pneumothorax. The heart and
mediastinum are unremarkable. There is a right shoulder arthroplasty.
IMPRESSION: No acute disease of the chest.

## 2013-05-13 IMAGING — CR DG CHEST 2V
1 series · 2 of 2 positions shown · non-contrast
Comparison: none

REASON FOR EXAM: dyspnea
COMMENTS:

[Series 1: view not recorded · 0.17mm/px · 2 of 2 slices shown]
[im 1/2]
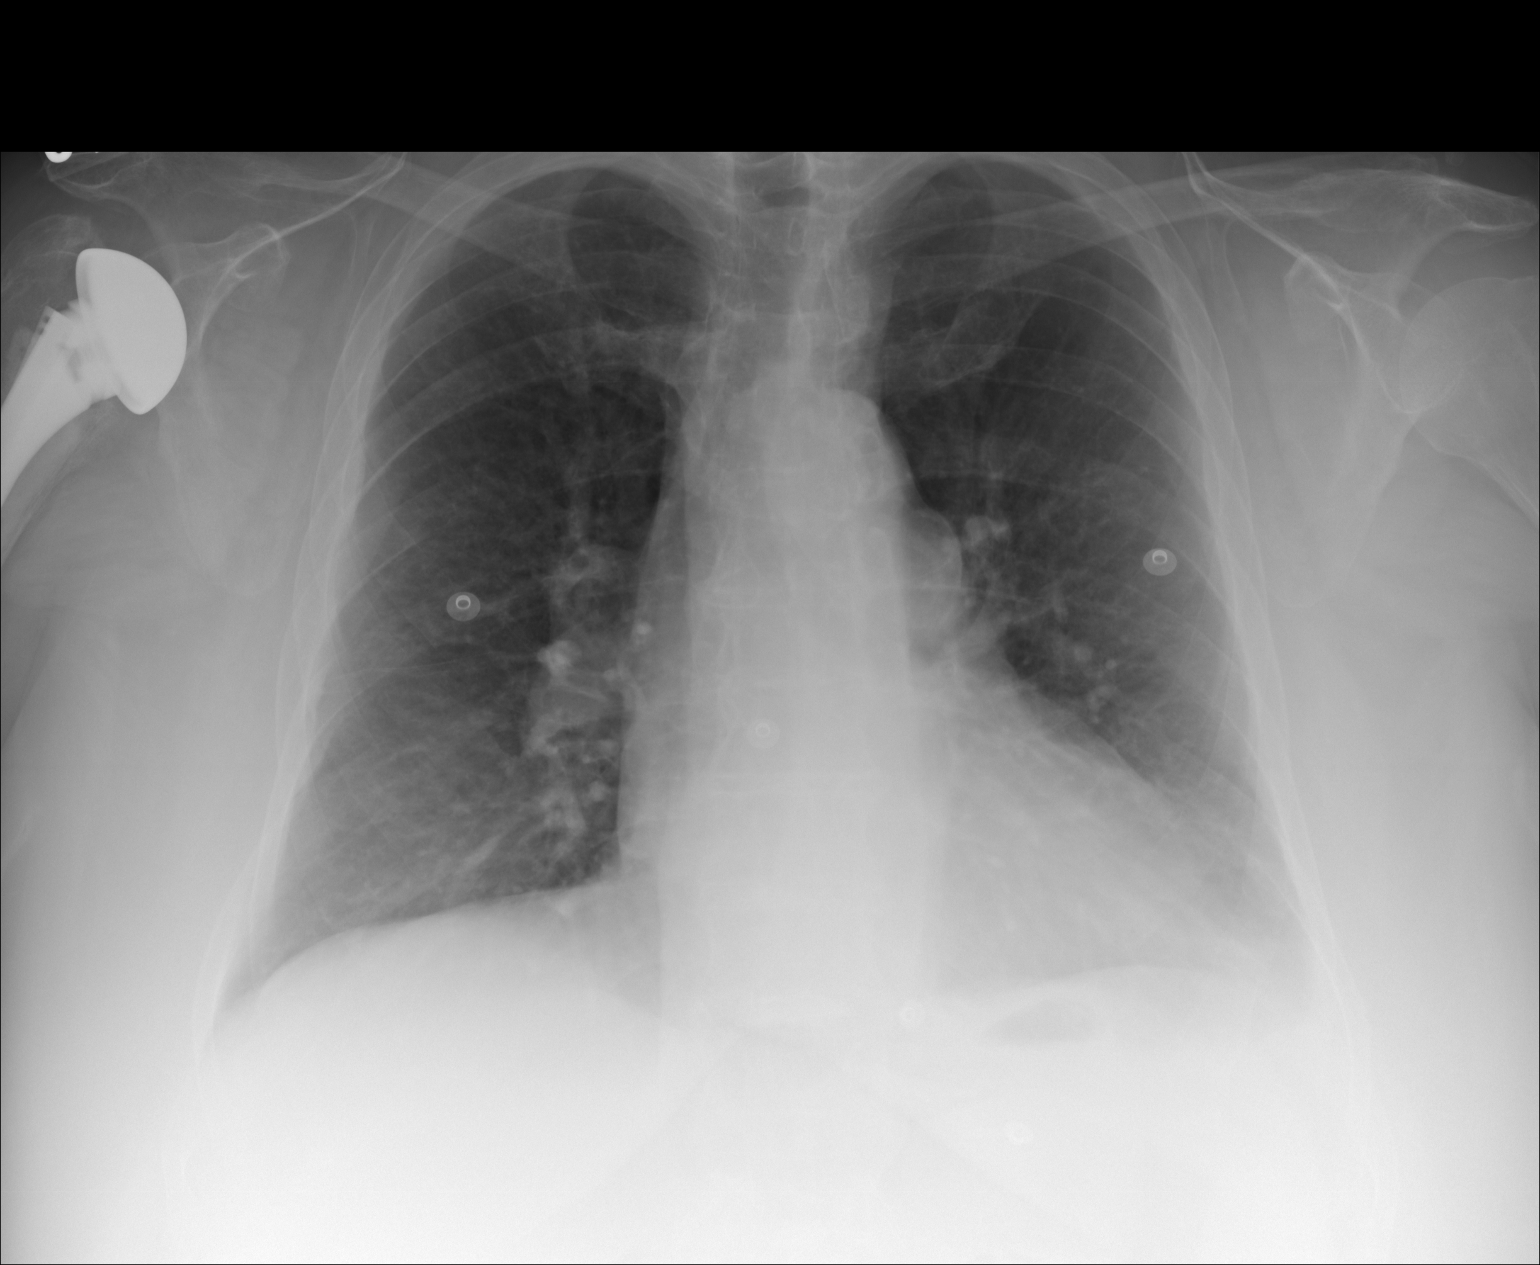
[im 2/2]
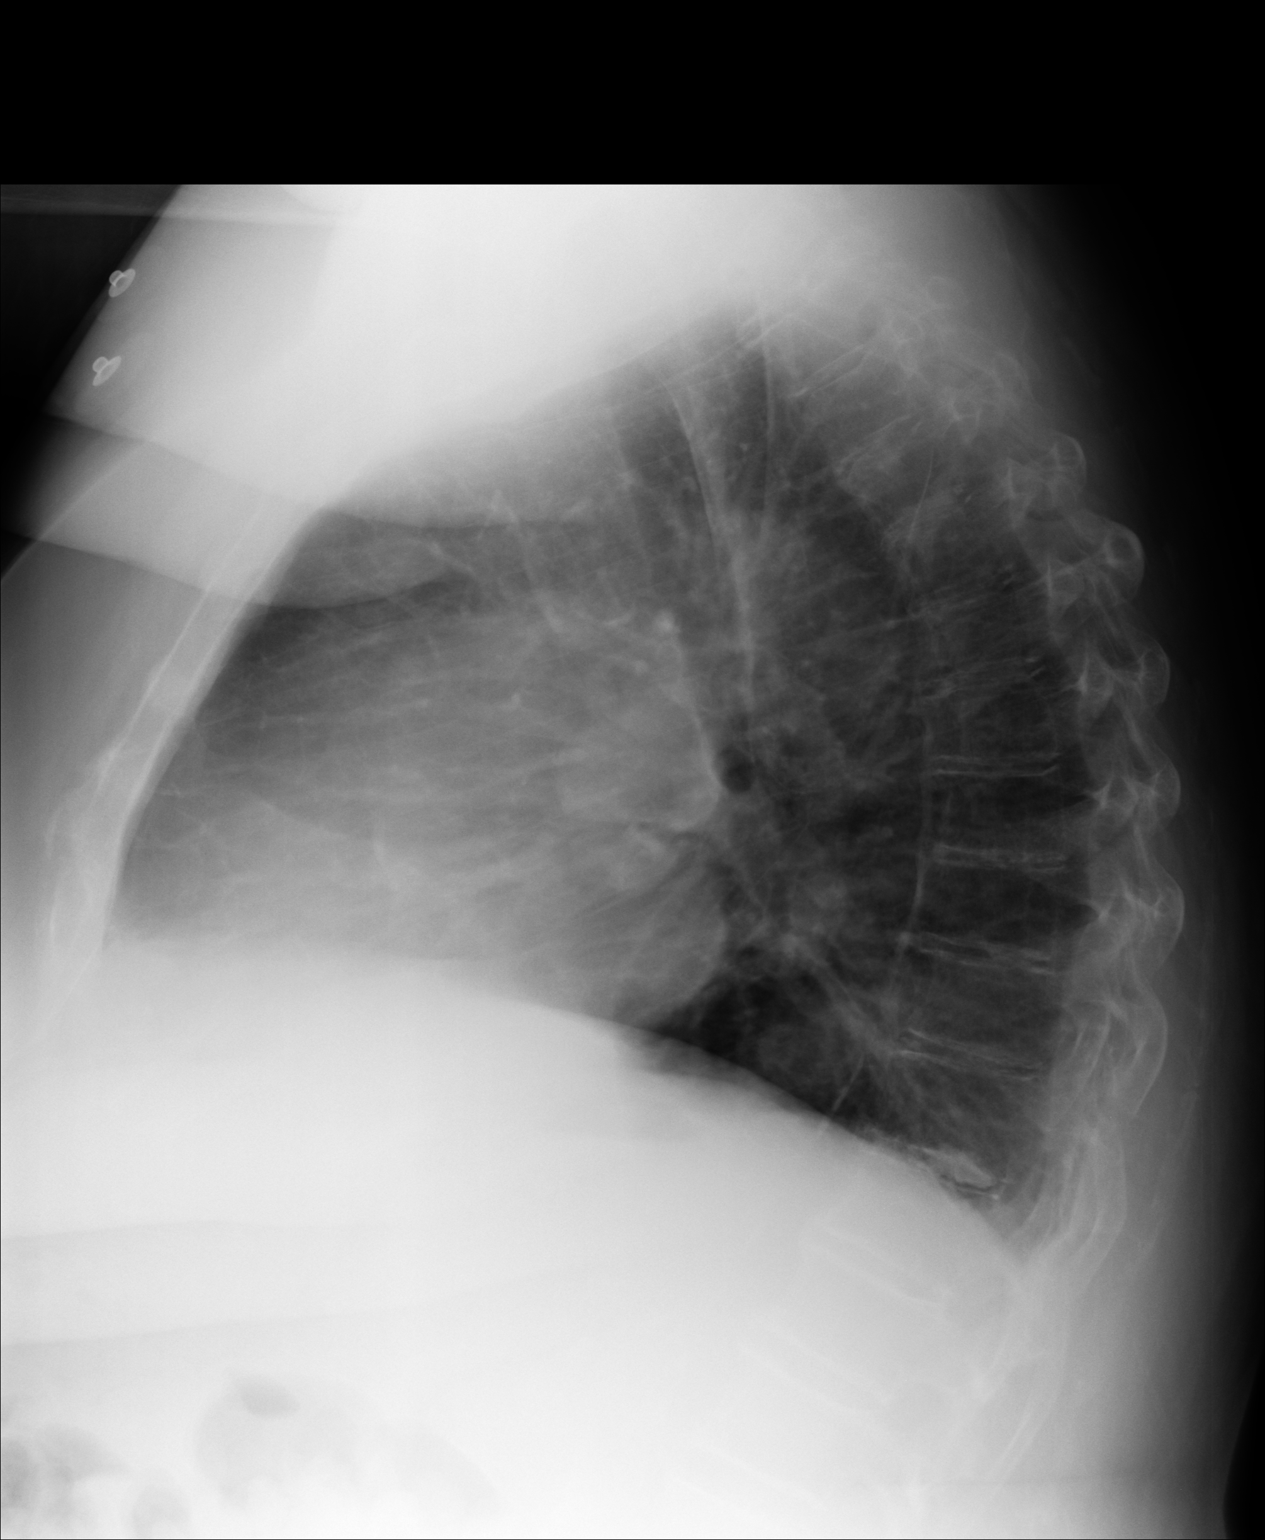

[2 of 2 positions shown; findings below may reference images not displayed]

PROCEDURE:     DXR - DXR CHEST PA (OR AP) AND LATERAL  - October 04, 2011  [DATE]

RESULT:     The cardiac silhouette is borderline to mildly enlarged. There
is no evidence of a pneumothorax or acute bony abnormality. The lungs appear
clear and mildly hyperinflated. Atherosclerotic calcification is noted in
the aorta. Right shoulder hemiarthroplasty changes are present.
IMPRESSION: No acute cardiopulmonary disease evident.

## 2013-05-16 IMAGING — CR DG CHEST 2V
1 series · 2 of 2 positions shown · non-contrast
Comparison: none

REASON FOR EXAM: sob
COMMENTS:

PROCEDURE:     DXR - DXR CHEST PA (OR AP) AND LATERAL  - October 07, 2011  [DATE]
RESULT:     Comparison: 10/04/2011

[Series 1: w chest pa · 0.14mm/px · 2 of 2 slices shown]
[im 1/2]
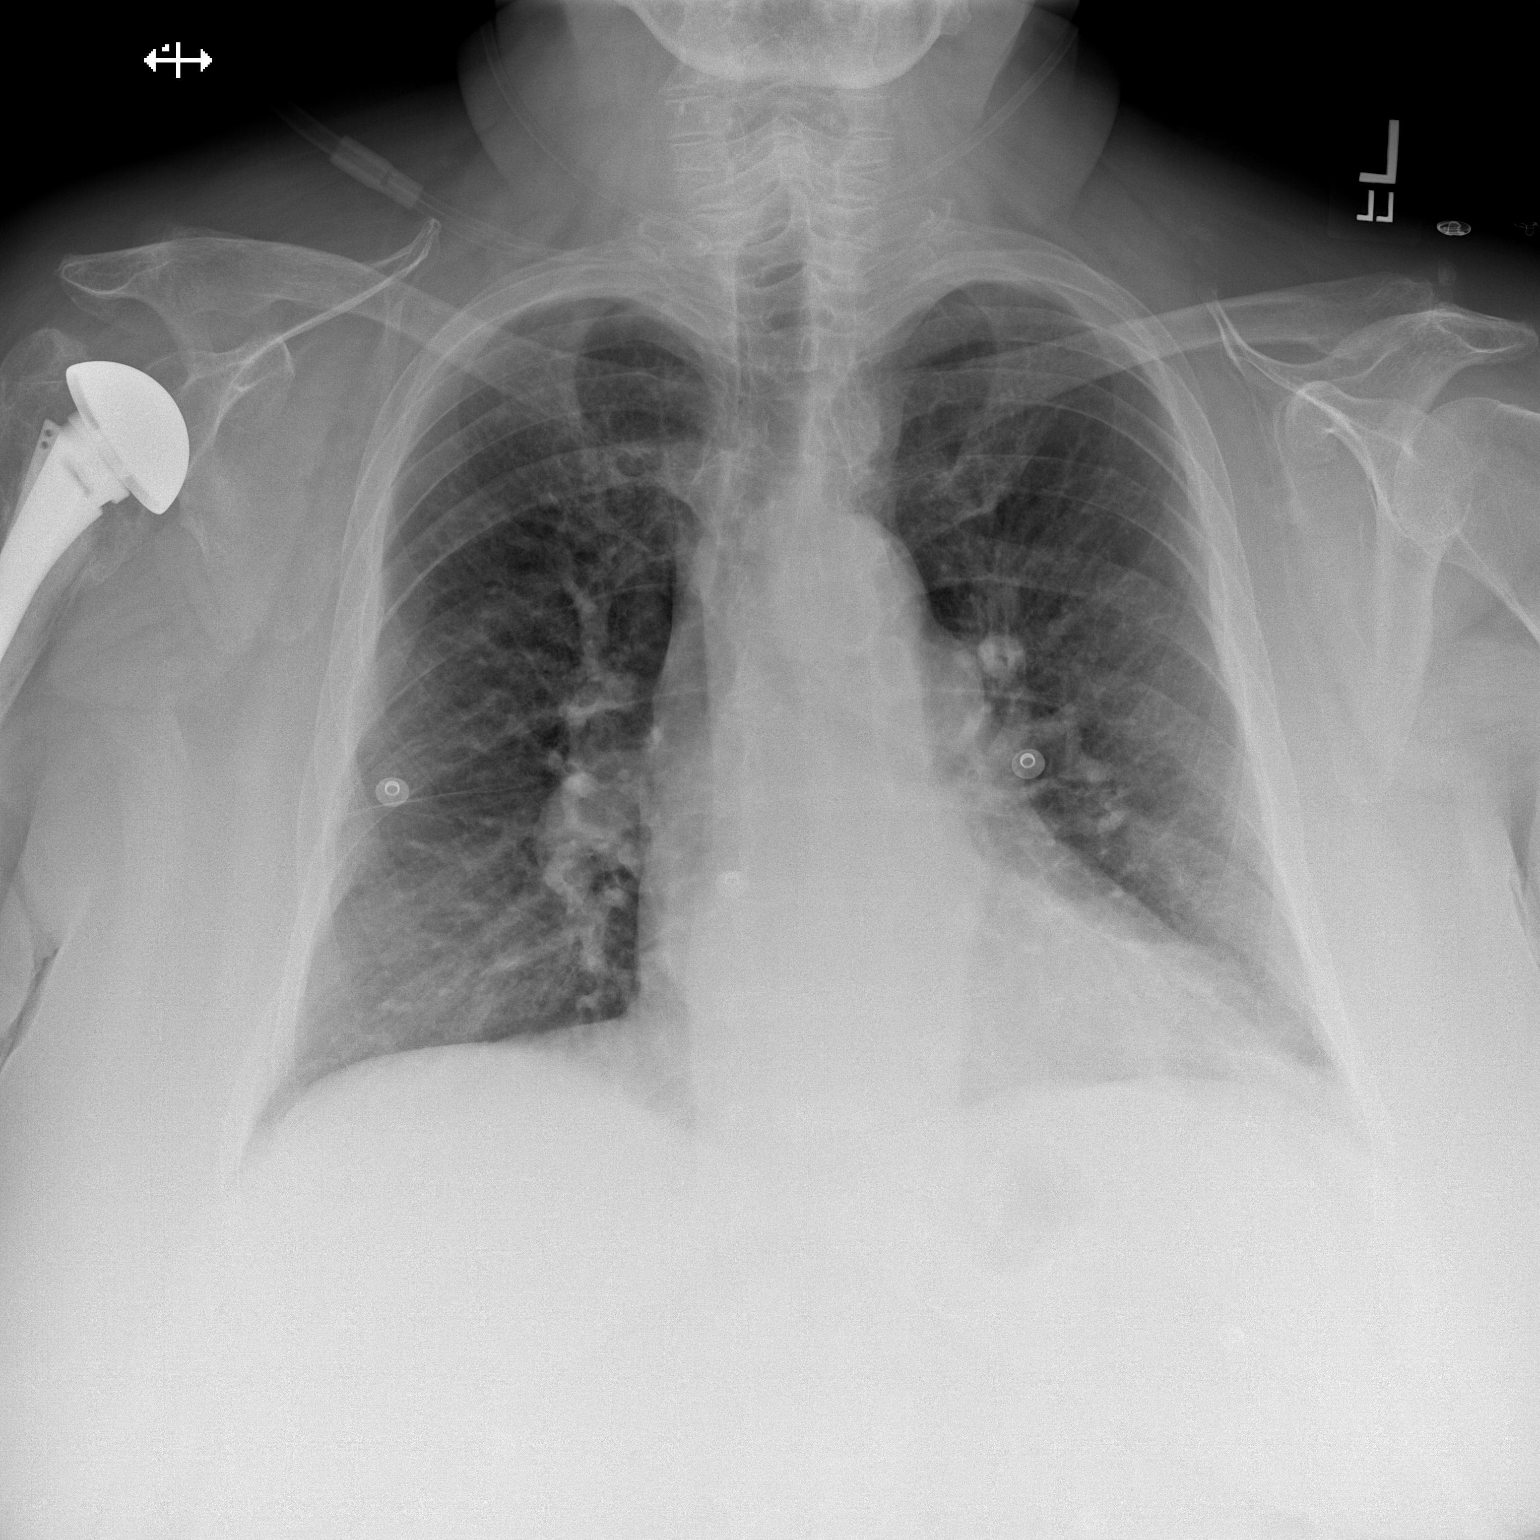
[im 2/2]
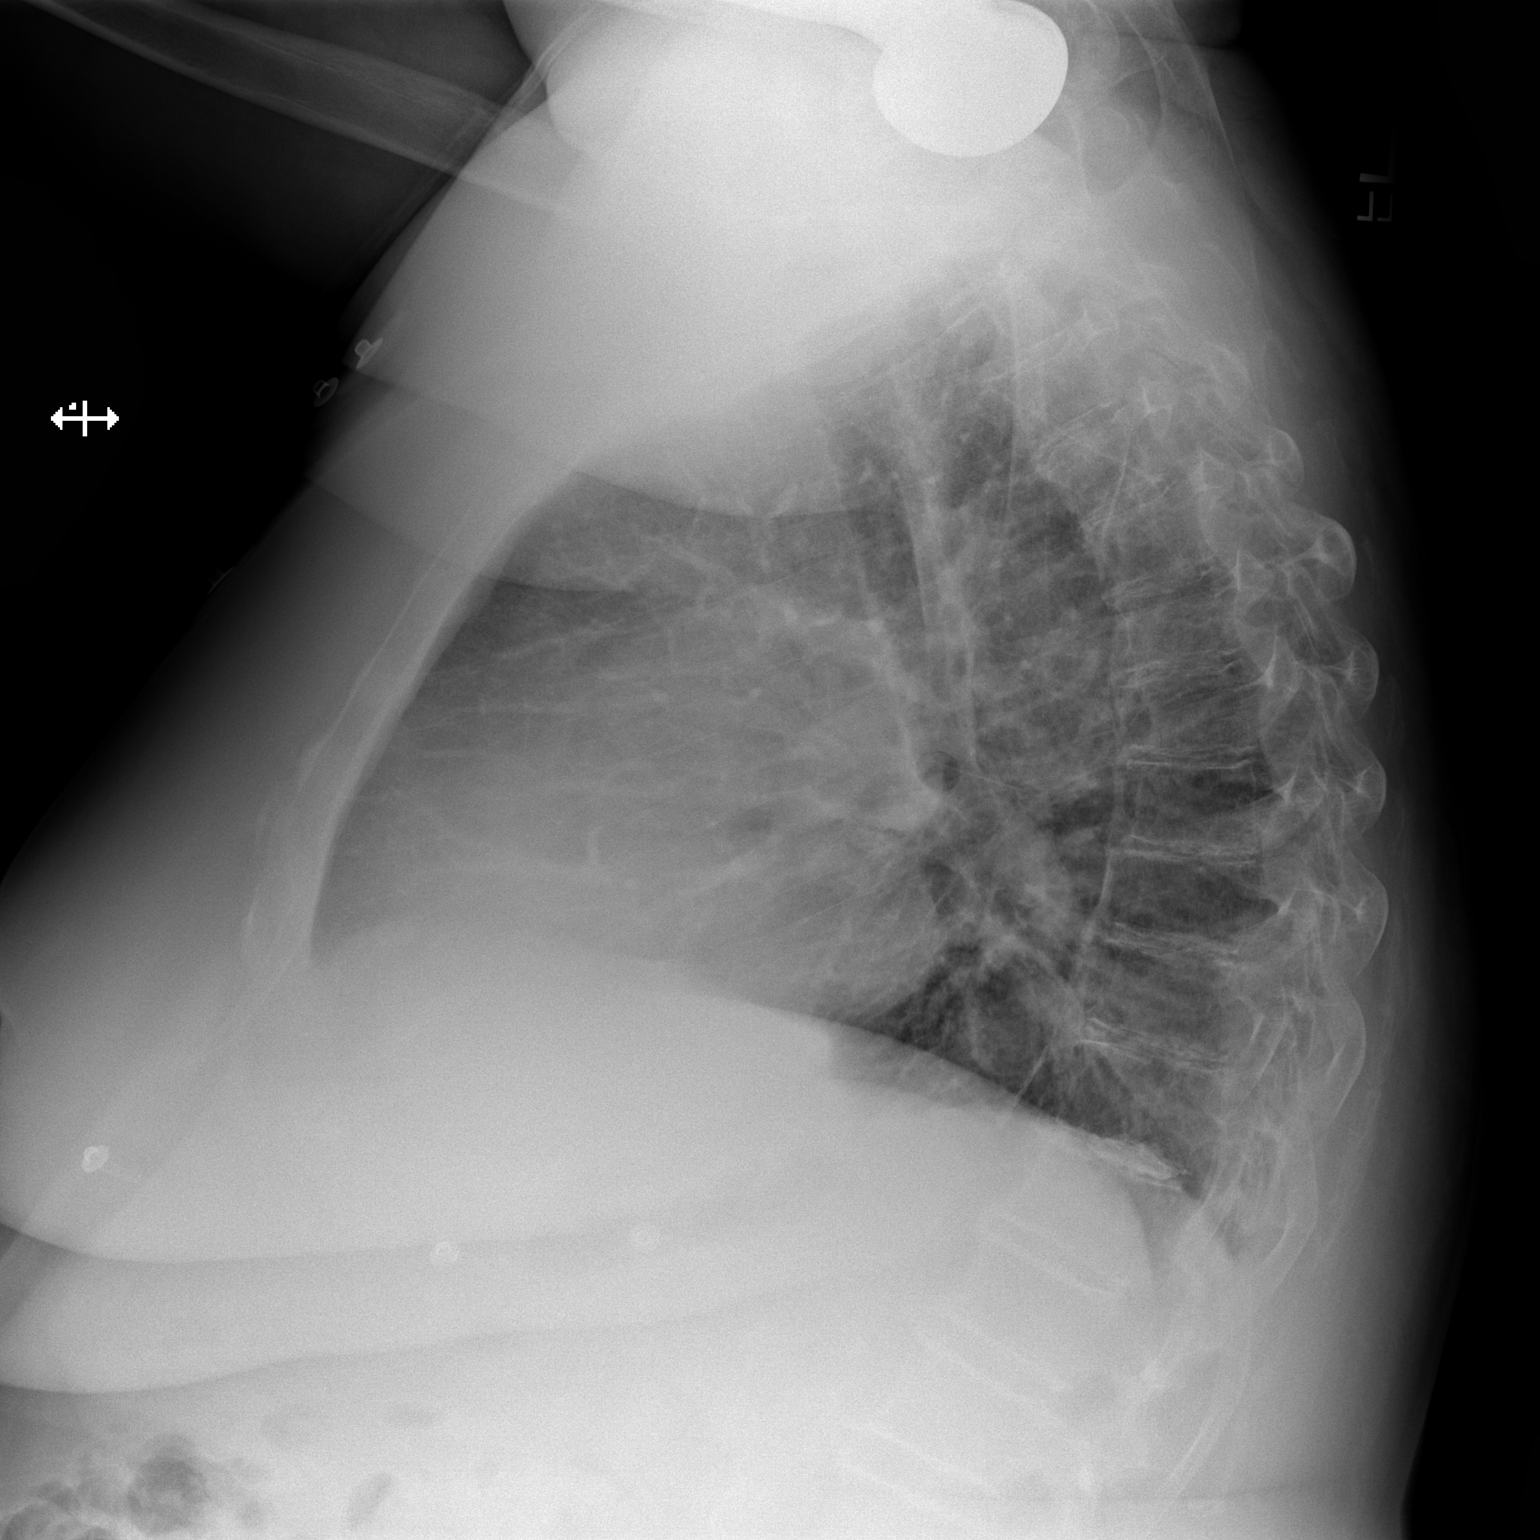

[2 of 2 positions shown; findings below may reference images not displayed]

FINDINGS: The heart and mediastinum are stable. Mild prominence of the central
pulmonary vasculature is similar to prior. No focal pulmonary opacities.
IMPRESSION: No acute cardiopulmonary disease.

## 2013-07-03 ENCOUNTER — Ambulatory Visit: Payer: Self-pay | Admitting: Family Medicine

## 2013-07-27 ENCOUNTER — Emergency Department: Payer: Self-pay | Admitting: Emergency Medicine

## 2013-07-27 LAB — BASIC METABOLIC PANEL
Anion Gap: 1 — ABNORMAL LOW (ref 7–16)
Co2: 31 mmol/L (ref 21–32)
Creatinine: 1.34 mg/dL — ABNORMAL HIGH (ref 0.60–1.30)
Osmolality: 280 (ref 275–301)

## 2013-07-27 LAB — URINALYSIS, COMPLETE
Blood: NEGATIVE
Glucose,UR: NEGATIVE mg/dL (ref 0–75)
Ketone: NEGATIVE
Leukocyte Esterase: NEGATIVE
Nitrite: NEGATIVE
Specific Gravity: 1.004 (ref 1.003–1.030)

## 2013-07-27 LAB — CBC
MCH: 32.2 pg (ref 26.0–34.0)
MCHC: 34.2 g/dL (ref 32.0–36.0)
MCV: 94 fL (ref 80–100)
Platelet: 199 10*3/uL (ref 150–440)
RDW: 13.3 % (ref 11.5–14.5)

## 2013-07-27 LAB — TROPONIN I: Troponin-I: <0.02 ng/mL

## 2013-07-27 LAB — PRO B NATRIURETIC PEPTIDE: B-Type Natriuretic Peptide: 495 pg/mL — ABNORMAL HIGH (ref 0–125)

## 2013-09-30 ENCOUNTER — Inpatient Hospital Stay: Payer: Self-pay | Admitting: Internal Medicine

## 2013-09-30 LAB — COMPREHENSIVE METABOLIC PANEL
Alkaline Phosphatase: 116 U/L (ref 50–136)
Anion Gap: 3 — ABNORMAL LOW (ref 7–16)
Calcium, Total: 9.1 mg/dL (ref 8.5–10.1)
Chloride: 102 mmol/L (ref 98–107)
Co2: 32 mmol/L (ref 21–32)
Creatinine: 1.61 mg/dL — ABNORMAL HIGH (ref 0.60–1.30)
EGFR (African American): 37 — ABNORMAL LOW
EGFR (Non-African Amer.): 32 — ABNORMAL LOW
Osmolality: 280 (ref 275–301)
SGOT(AST): 9 U/L — ABNORMAL LOW (ref 15–37)
SGPT (ALT): 16 U/L (ref 12–78)

## 2013-09-30 LAB — CBC
HCT: 36.8 % (ref 35.0–47.0)
HGB: 12.5 g/dL (ref 12.0–16.0)
MCHC: 34.1 g/dL (ref 32.0–36.0)
MCV: 95 fL (ref 80–100)
Platelet: 218 10*3/uL (ref 150–440)
RBC: 3.88 10*6/uL (ref 3.80–5.20)

## 2013-09-30 LAB — PROTIME-INR
INR: 1
Prothrombin Time: 12.9 secs (ref 11.5–14.7)

## 2013-09-30 LAB — URINALYSIS, COMPLETE
Bilirubin,UR: NEGATIVE
Blood: NEGATIVE
Glucose,UR: NEGATIVE mg/dL (ref 0–75)
Hyaline Cast: 20
Leukocyte Esterase: NEGATIVE
Nitrite: NEGATIVE
RBC,UR: 1 /HPF (ref 0–5)
Specific Gravity: 1.008 (ref 1.003–1.030)
WBC UR: 1 /HPF (ref 0–5)

## 2013-09-30 LAB — PRO B NATRIURETIC PEPTIDE: B-Type Natriuretic Peptide: 1278 pg/mL — ABNORMAL HIGH (ref 0–125)

## 2013-09-30 LAB — CK TOTAL AND CKMB (NOT AT ARMC)
CK-MB: 0.7 ng/mL (ref 0.5–3.6)
CK-MB: 0.6 ng/mL (ref 0.5–3.6)

## 2013-10-01 LAB — CBC WITH DIFFERENTIAL/PLATELET
HCT: 34.1 % — ABNORMAL LOW (ref 35.0–47.0)
HGB: 11.9 g/dL — ABNORMAL LOW (ref 12.0–16.0)
MCHC: 34.8 g/dL (ref 32.0–36.0)
MCV: 93 fL (ref 80–100)
Monocyte %: 0.8 %
Neutrophil #: 6.8 10*3/uL — ABNORMAL HIGH (ref 1.4–6.5)
Neutrophil %: 92.3 %
RDW: 13 % (ref 11.5–14.5)
WBC: 7.3 10*3/uL (ref 3.6–11.0)

## 2013-10-01 LAB — CK TOTAL AND CKMB (NOT AT ARMC)
CK, Total: 35 U/L (ref 21–215)
CK-MB: <0.5 ng/mL — ABNORMAL LOW (ref 0.5–3.6)

## 2013-10-01 LAB — BASIC METABOLIC PANEL
Anion Gap: 5 — ABNORMAL LOW (ref 7–16)
Calcium, Total: 9 mg/dL (ref 8.5–10.1)
EGFR (African American): 40 — ABNORMAL LOW
EGFR (Non-African Amer.): 35 — ABNORMAL LOW
Glucose: 171 mg/dL — ABNORMAL HIGH (ref 65–99)
Osmolality: 282 (ref 275–301)

## 2013-10-01 LAB — TROPONIN I: Troponin-I: <0.02 ng/mL

## 2013-10-02 LAB — CREATININE, SERUM: Creatinine: 1.47 mg/dL — ABNORMAL HIGH (ref 0.60–1.30)

## 2013-10-03 LAB — CBC WITH DIFFERENTIAL/PLATELET
Basophil #: 0 10*3/uL (ref 0.0–0.1)
Basophil %: 0 %
Eosinophil #: 0 10*3/uL (ref 0.0–0.7)
Eosinophil %: 0 %
HCT: 36.7 % (ref 35.0–47.0)
HGB: 12.3 g/dL (ref 12.0–16.0)
Lymphocyte #: 0.5 10*3/uL — ABNORMAL LOW (ref 1.0–3.6)
Lymphocyte %: 3.3 %
MCH: 31.7 pg (ref 26.0–34.0)
MCV: 95 fL (ref 80–100)
Monocyte #: 0.4 x10 3/mm (ref 0.2–0.9)
Monocyte %: 2.5 %
Neutrophil #: 13.3 10*3/uL — ABNORMAL HIGH (ref 1.4–6.5)
RBC: 3.89 10*6/uL (ref 3.80–5.20)
RDW: 13.2 % (ref 11.5–14.5)
WBC: 14.1 10*3/uL — ABNORMAL HIGH (ref 3.6–11.0)

## 2013-10-03 LAB — BASIC METABOLIC PANEL
Co2: 32 mmol/L (ref 21–32)
Creatinine: 1.66 mg/dL — ABNORMAL HIGH (ref 0.60–1.30)
EGFR (African American): 36 — ABNORMAL LOW
Osmolality: 289 (ref 275–301)
Potassium: 4 mmol/L (ref 3.5–5.1)

## 2013-10-05 LAB — CULTURE, BLOOD (SINGLE)

## 2013-10-10 IMAGING — CR DG CHEST 2V
1 series · 2 of 2 positions shown · non-contrast
Comparison: none

REASON FOR EXAM: SOB, cough
COMMENTS:

PROCEDURE:     DXR - DXR CHEST PA (OR AP) AND LATERAL  - March 02, 2012 [DATE]
RESULT:     Comparison: 10/07/2011

[Series 1: w chest pa · 0.14mm/px · 2 of 2 slices shown]
[im 1/2]
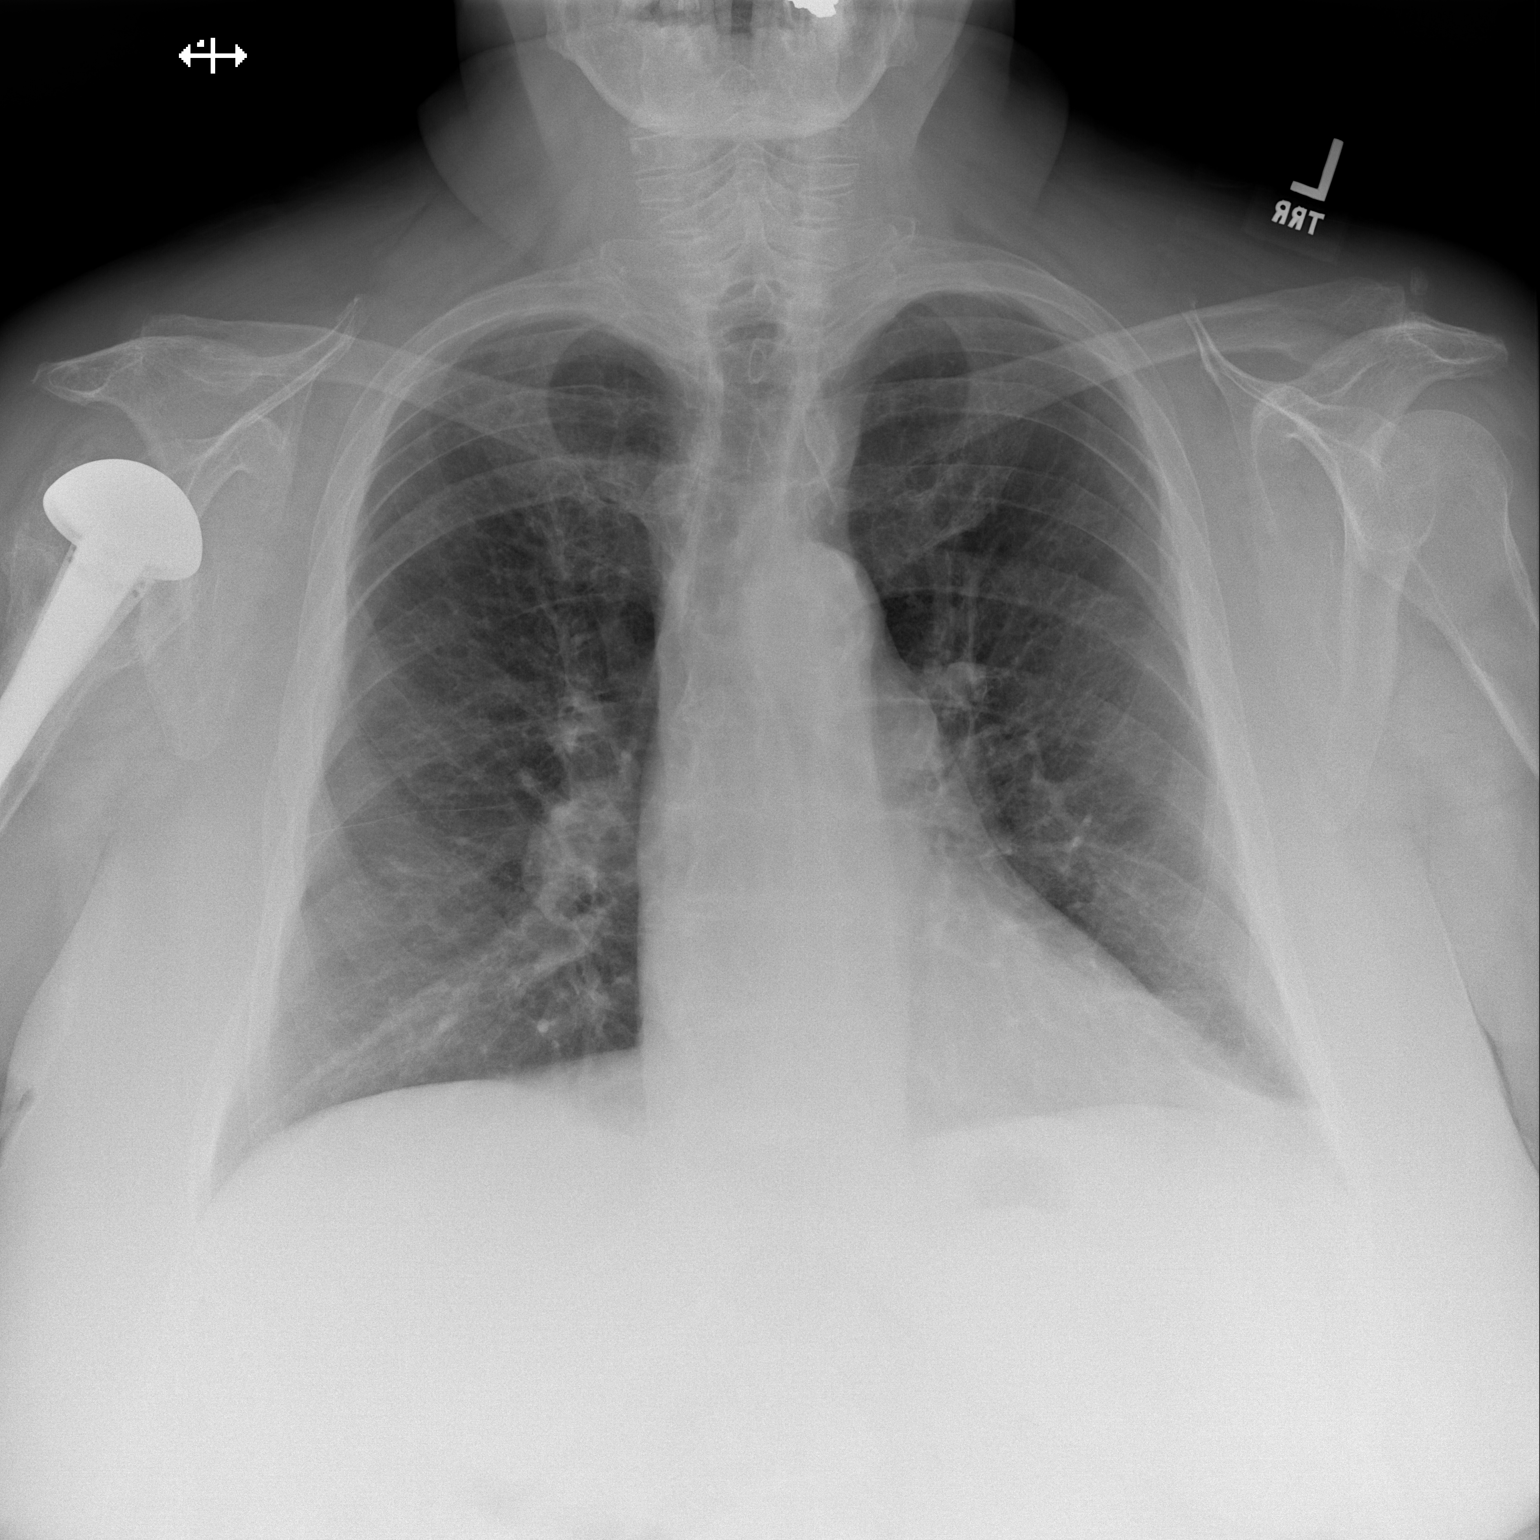
[im 2/2]
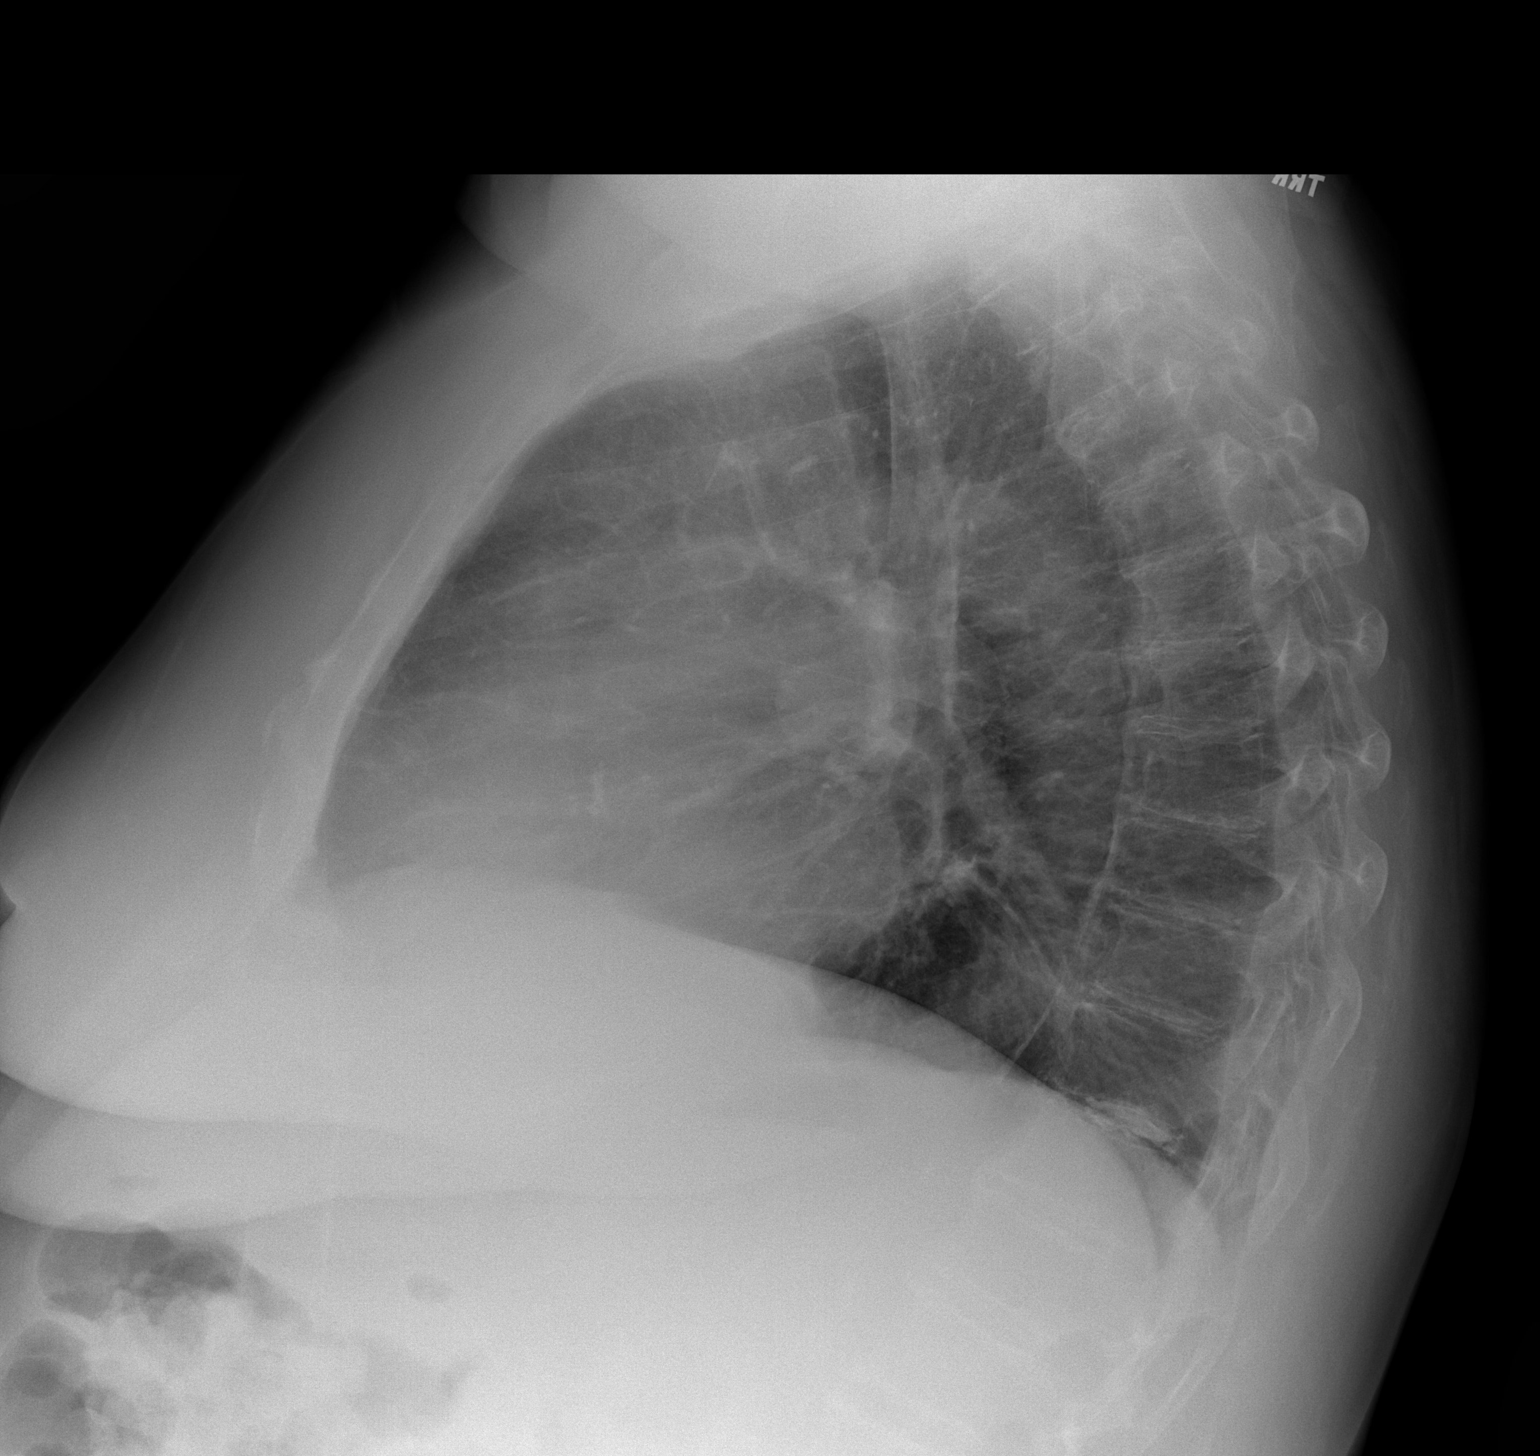

[2 of 2 positions shown; findings below may reference images not displayed]

FINDINGS: PA and lateral chest radiographs are provided.  There is no focal
parenchymal opacity, pleural effusion, or pneumothorax. The heart and
mediastinum are unremarkable. There is a right shoulder arthroplasty.
IMPRESSION: No acute disease of the che[REDACTED]

## 2013-10-11 ENCOUNTER — Inpatient Hospital Stay: Payer: Self-pay | Admitting: Internal Medicine

## 2013-10-11 LAB — CBC WITH DIFFERENTIAL/PLATELET
Bands: 2 %
Lymphocytes: 11 %
Metamyelocyte: 1 %
Segmented Neutrophils: 75 %

## 2013-10-11 LAB — COMPREHENSIVE METABOLIC PANEL
Albumin: 3.7 g/dL (ref 3.4–5.0)
Alkaline Phosphatase: 64 U/L (ref 50–136)
Anion Gap: 6 — ABNORMAL LOW (ref 7–16)
BUN: 43 mg/dL — ABNORMAL HIGH (ref 7–18)
Bilirubin,Total: 0.6 mg/dL (ref 0.2–1.0)
Calcium, Total: 8.9 mg/dL (ref 8.5–10.1)
Chloride: 90 mmol/L — ABNORMAL LOW (ref 98–107)
Co2: 28 mmol/L (ref 21–32)
EGFR (African American): 27 — ABNORMAL LOW
Glucose: 120 mg/dL — ABNORMAL HIGH (ref 65–99)
Osmolality: 262 (ref 275–301)
Potassium: 4.3 mmol/L (ref 3.5–5.1)
Total Protein: 6.4 g/dL (ref 6.4–8.2)

## 2013-10-11 LAB — URINALYSIS, COMPLETE
Hyaline Cast: 2
Ketone: NEGATIVE
Protein: NEGATIVE
RBC,UR: 1 /HPF (ref 0–5)
Squamous Epithelial: 10
WBC UR: 2 /HPF (ref 0–5)

## 2013-10-11 LAB — CBC
HCT: 35.9 % (ref 35.0–47.0)
HGB: 12.3 g/dL (ref 12.0–16.0)
MCH: 31.5 pg (ref 26.0–34.0)
RBC: 3.9 10*6/uL (ref 3.80–5.20)
RDW: 13 % (ref 11.5–14.5)

## 2013-10-11 LAB — TROPONIN I: Troponin-I: <0.02 ng/mL

## 2013-10-12 LAB — COMPREHENSIVE METABOLIC PANEL
Albumin: 3.1 g/dL — ABNORMAL LOW (ref 3.4–5.0)
Anion Gap: 3 — ABNORMAL LOW (ref 7–16)
BUN: 31 mg/dL — ABNORMAL HIGH (ref 7–18)
Calcium, Total: 8.1 mg/dL — ABNORMAL LOW (ref 8.5–10.1)
Chloride: 99 mmol/L (ref 98–107)
Creatinine: 1.58 mg/dL — ABNORMAL HIGH (ref 0.60–1.30)
EGFR (Non-African Amer.): 33 — ABNORMAL LOW
Osmolality: 272 (ref 275–301)
Potassium: 3.8 mmol/L (ref 3.5–5.1)
SGOT(AST): 17 U/L (ref 15–37)
SGPT (ALT): 16 U/L (ref 12–78)
Sodium: 131 mmol/L — ABNORMAL LOW (ref 136–145)
Total Protein: 5.5 g/dL — ABNORMAL LOW (ref 6.4–8.2)

## 2013-10-12 LAB — CBC WITH DIFFERENTIAL/PLATELET
Basophil %: 0.1 %
Eosinophil #: 0.1 10*3/uL (ref 0.0–0.7)
Eosinophil %: 0.7 %
HCT: 33.7 % — ABNORMAL LOW (ref 35.0–47.0)
HGB: 11.5 g/dL — ABNORMAL LOW (ref 12.0–16.0)
Lymphocyte #: 2.2 10*3/uL (ref 1.0–3.6)
Lymphocyte %: 15.6 %
MCHC: 34.2 g/dL (ref 32.0–36.0)
Monocyte #: 1 x10 3/mm — ABNORMAL HIGH (ref 0.2–0.9)
Monocyte %: 7 %
Neutrophil %: 76.6 %
Platelet: 195 10*3/uL (ref 150–440)
RBC: 3.65 10*6/uL — ABNORMAL LOW (ref 3.80–5.20)
RDW: 12.7 % (ref 11.5–14.5)

## 2013-10-12 LAB — SODIUM, URINE, RANDOM: Sodium, Urine Random: 27 mmol/L (ref 20–110)

## 2013-10-12 LAB — OSMOLALITY, URINE: Osmolality: 219 mOsm/kg

## 2013-10-13 LAB — WBC: WBC: 11.6 10*3/uL — ABNORMAL HIGH (ref 3.6–11.0)

## 2013-10-13 LAB — URINE CULTURE

## 2013-10-16 LAB — CULTURE, BLOOD (SINGLE)

## 2014-01-10 ENCOUNTER — Inpatient Hospital Stay: Payer: Self-pay | Admitting: Internal Medicine

## 2014-01-10 LAB — TROPONIN I
Troponin-I: <0.02 ng/mL
Troponin-I: <0.02 ng/mL

## 2014-01-10 LAB — CK TOTAL AND CKMB (NOT AT ARMC)
CK, TOTAL: 39 U/L (ref 21–215)
CK-MB: 1.3 ng/mL (ref 0.5–3.6)

## 2014-01-10 LAB — COMPREHENSIVE METABOLIC PANEL
ALK PHOS: 76 U/L
Albumin: 3.8 g/dL (ref 3.4–5.0)
Anion Gap: 6 — ABNORMAL LOW (ref 7–16)
BILIRUBIN TOTAL: 0.4 mg/dL (ref 0.2–1.0)
BUN: 36 mg/dL — ABNORMAL HIGH (ref 7–18)
CALCIUM: 9.1 mg/dL (ref 8.5–10.1)
CO2: 29 mmol/L (ref 21–32)
Chloride: 97 mmol/L — ABNORMAL LOW (ref 98–107)
Creatinine: 1.48 mg/dL — ABNORMAL HIGH (ref 0.60–1.30)
EGFR (African American): 41 — ABNORMAL LOW
EGFR (Non-African Amer.): 36 — ABNORMAL LOW
Glucose: 112 mg/dL — ABNORMAL HIGH (ref 65–99)
Osmolality: 274 (ref 275–301)
Potassium: 4.7 mmol/L (ref 3.5–5.1)
SGOT(AST): 21 U/L (ref 15–37)
SGPT (ALT): 17 U/L (ref 12–78)
SODIUM: 132 mmol/L — AB (ref 136–145)
TOTAL PROTEIN: 7.2 g/dL (ref 6.4–8.2)

## 2014-01-10 LAB — CBC
HCT: 38.7 % (ref 35.0–47.0)
HGB: 13.2 g/dL (ref 12.0–16.0)
MCH: 31.9 pg (ref 26.0–34.0)
MCHC: 34.2 g/dL (ref 32.0–36.0)
MCV: 94 fL (ref 80–100)
Platelet: 245 10*3/uL (ref 150–440)
RBC: 4.14 10*6/uL (ref 3.80–5.20)
RDW: 14 % (ref 11.5–14.5)
WBC: 13.8 10*3/uL — ABNORMAL HIGH (ref 3.6–11.0)

## 2014-01-10 LAB — PRO B NATRIURETIC PEPTIDE: B-Type Natriuretic Peptide: 413 pg/mL — ABNORMAL HIGH (ref 0–125)

## 2014-01-11 LAB — CBC WITH DIFFERENTIAL/PLATELET
BASOS PCT: 0 %
Basophil #: 0 10*3/uL (ref 0.0–0.1)
Eosinophil #: 0 10*3/uL (ref 0.0–0.7)
Eosinophil %: 0 %
HCT: 36.4 % (ref 35.0–47.0)
HGB: 12.4 g/dL (ref 12.0–16.0)
LYMPHS ABS: 0.6 10*3/uL — AB (ref 1.0–3.6)
LYMPHS PCT: 4.3 %
MCH: 31.9 pg (ref 26.0–34.0)
MCHC: 33.9 g/dL (ref 32.0–36.0)
MCV: 94 fL (ref 80–100)
Monocyte #: 0.4 x10 3/mm (ref 0.2–0.9)
Monocyte %: 3.2 %
NEUTROS ABS: 11.9 10*3/uL — AB (ref 1.4–6.5)
NEUTROS PCT: 92.5 %
PLATELETS: 200 10*3/uL (ref 150–440)
RBC: 3.88 10*6/uL (ref 3.80–5.20)
RDW: 13.8 % (ref 11.5–14.5)
WBC: 12.9 10*3/uL — ABNORMAL HIGH (ref 3.6–11.0)

## 2014-01-11 LAB — BASIC METABOLIC PANEL
ANION GAP: 2 — AB (ref 7–16)
BUN: 32 mg/dL — ABNORMAL HIGH (ref 7–18)
CHLORIDE: 100 mmol/L (ref 98–107)
Calcium, Total: 9.2 mg/dL (ref 8.5–10.1)
Co2: 32 mmol/L (ref 21–32)
Creatinine: 1.36 mg/dL — ABNORMAL HIGH (ref 0.60–1.30)
EGFR (Non-African Amer.): 39 — ABNORMAL LOW
GFR CALC AF AMER: 46 — AB
Glucose: 151 mg/dL — ABNORMAL HIGH (ref 65–99)
OSMOLALITY: 278 (ref 275–301)
POTASSIUM: 4.9 mmol/L (ref 3.5–5.1)
Sodium: 134 mmol/L — ABNORMAL LOW (ref 136–145)

## 2014-01-13 ENCOUNTER — Inpatient Hospital Stay: Payer: Self-pay | Admitting: Internal Medicine

## 2014-01-13 LAB — COMPREHENSIVE METABOLIC PANEL WITH GFR
Albumin: 3.4 g/dL
Alkaline Phosphatase: 66 U/L
Anion Gap: 3 — ABNORMAL LOW
BUN: 41 mg/dL — ABNORMAL HIGH
Bilirubin,Total: 0.4 mg/dL
Calcium, Total: 9.3 mg/dL
Chloride: 99 mmol/L
Co2: 30 mmol/L
Creatinine: 1.31 mg/dL — ABNORMAL HIGH
EGFR (African American): 48 — ABNORMAL LOW
EGFR (Non-African Amer.): 41 — ABNORMAL LOW
Glucose: 102 mg/dL — ABNORMAL HIGH
Osmolality: 275
Potassium: 4.5 mmol/L
SGOT(AST): 17 U/L
SGPT (ALT): 18 U/L
Sodium: 132 mmol/L — ABNORMAL LOW
Total Protein: 6.5 g/dL

## 2014-01-13 LAB — CK TOTAL AND CKMB (NOT AT ARMC)
CK, Total: 34 U/L
CK, Total: 32 U/L
CK, Total: 30 U/L
CK-MB: 2 ng/mL
CK-MB: 2.1 ng/mL
CK-MB: 1.7 ng/mL

## 2014-01-13 LAB — CBC
HCT: 38.8 %
HGB: 12.9 g/dL
MCH: 31 pg
MCHC: 33.2 g/dL
MCV: 94 fL
Platelet: 248 x10 3/mm 3
RBC: 4.15 X10 6/mm 3
RDW: 13.7 %
WBC: 19.1 x10 3/mm 3 — ABNORMAL HIGH

## 2014-01-13 LAB — URINALYSIS, COMPLETE
BLOOD: NEGATIVE
Bacteria: NONE SEEN
Bilirubin,UR: NEGATIVE
GLUCOSE, UR: NEGATIVE mg/dL (ref 0–75)
Ketone: NEGATIVE
LEUKOCYTE ESTERASE: NEGATIVE
Nitrite: NEGATIVE
Ph: 5 (ref 4.5–8.0)
Protein: NEGATIVE
RBC,UR: <1 /HPF (ref 0–5)
Specific Gravity: 1.005 (ref 1.003–1.030)
Squamous Epithelial: <1
WBC UR: <1 /HPF (ref 0–5)

## 2014-01-13 LAB — TROPONIN I
Troponin-I: 0.03 ng/mL
Troponin-I: 0.04 ng/mL
Troponin-I: 0.03 ng/mL

## 2014-01-13 LAB — PRO B NATRIURETIC PEPTIDE: B-Type Natriuretic Peptide: 2040 pg/mL — ABNORMAL HIGH

## 2014-01-14 LAB — CBC WITH DIFFERENTIAL/PLATELET
BASOS ABS: 0 10*3/uL (ref 0.0–0.1)
Basophil %: 0.1 %
Eosinophil #: 0 10*3/uL (ref 0.0–0.7)
Eosinophil %: 0.3 %
HCT: 37.9 % (ref 35.0–47.0)
HGB: 12.6 g/dL (ref 12.0–16.0)
LYMPHS PCT: 12.8 %
Lymphocyte #: 1.8 10*3/uL (ref 1.0–3.6)
MCH: 31 pg (ref 26.0–34.0)
MCHC: 33.3 g/dL (ref 32.0–36.0)
MCV: 93 fL (ref 80–100)
MONOS PCT: 9.8 %
Monocyte #: 1.4 x10 3/mm — ABNORMAL HIGH (ref 0.2–0.9)
NEUTROS ABS: 11 10*3/uL — AB (ref 1.4–6.5)
NEUTROS PCT: 77 %
Platelet: 235 10*3/uL (ref 150–440)
RBC: 4.07 10*6/uL (ref 3.80–5.20)
RDW: 14 % (ref 11.5–14.5)
WBC: 14.3 10*3/uL — AB (ref 3.6–11.0)

## 2014-01-14 LAB — BASIC METABOLIC PANEL
ANION GAP: 1 — AB (ref 7–16)
BUN: 46 mg/dL — AB (ref 7–18)
CO2: 38 mmol/L — AB (ref 21–32)
Calcium, Total: 9.2 mg/dL (ref 8.5–10.1)
Chloride: 92 mmol/L — ABNORMAL LOW (ref 98–107)
Creatinine: 1.42 mg/dL — ABNORMAL HIGH (ref 0.60–1.30)
EGFR (Non-African Amer.): 37 — ABNORMAL LOW
GFR CALC AF AMER: 43 — AB
GLUCOSE: 86 mg/dL (ref 65–99)
OSMOLALITY: 274 (ref 275–301)
Potassium: 4 mmol/L (ref 3.5–5.1)
Sodium: 131 mmol/L — ABNORMAL LOW (ref 136–145)

## 2014-01-15 LAB — BASIC METABOLIC PANEL
BUN: 44 mg/dL — ABNORMAL HIGH (ref 7–18)
CALCIUM: 8.9 mg/dL (ref 8.5–10.1)
CHLORIDE: 94 mmol/L — AB (ref 98–107)
CREATININE: 1.4 mg/dL — AB (ref 0.60–1.30)
Co2: 37 mmol/L — ABNORMAL HIGH (ref 21–32)
EGFR (African American): 44 — ABNORMAL LOW
EGFR (Non-African Amer.): 38 — ABNORMAL LOW
Glucose: 113 mg/dL — ABNORMAL HIGH (ref 65–99)
Osmolality: 273 (ref 275–301)
POTASSIUM: 4.2 mmol/L (ref 3.5–5.1)
Sodium: 130 mmol/L — ABNORMAL LOW (ref 136–145)

## 2014-02-02 ENCOUNTER — Emergency Department: Payer: Self-pay | Admitting: Emergency Medicine

## 2014-02-08 ENCOUNTER — Inpatient Hospital Stay: Payer: Self-pay | Admitting: Specialist

## 2014-02-08 LAB — BASIC METABOLIC PANEL
Anion Gap: 4 — ABNORMAL LOW (ref 7–16)
BUN: 36 mg/dL — ABNORMAL HIGH (ref 7–18)
CHLORIDE: 96 mmol/L — AB (ref 98–107)
Calcium, Total: 9.1 mg/dL (ref 8.5–10.1)
Co2: 32 mmol/L (ref 21–32)
Creatinine: 1.46 mg/dL — ABNORMAL HIGH (ref 0.60–1.30)
EGFR (African American): 42 — ABNORMAL LOW
GFR CALC NON AF AMER: 36 — AB
GLUCOSE: 103 mg/dL — AB (ref 65–99)
Osmolality: 273 (ref 275–301)
POTASSIUM: 4.3 mmol/L (ref 3.5–5.1)
Sodium: 132 mmol/L — ABNORMAL LOW (ref 136–145)

## 2014-02-08 LAB — TROPONIN I: Troponin-I: <0.02 ng/mL

## 2014-02-08 LAB — PRO B NATRIURETIC PEPTIDE: B-Type Natriuretic Peptide: 448 pg/mL — ABNORMAL HIGH (ref 0–125)

## 2014-02-08 LAB — CBC
HCT: 33.1 % — ABNORMAL LOW (ref 35.0–47.0)
HGB: 11.2 g/dL — ABNORMAL LOW (ref 12.0–16.0)
MCH: 31.4 pg (ref 26.0–34.0)
MCHC: 33.8 g/dL (ref 32.0–36.0)
MCV: 93 fL (ref 80–100)
Platelet: 263 10*3/uL (ref 150–440)
RBC: 3.56 10*6/uL — AB (ref 3.80–5.20)
RDW: 14.4 % (ref 11.5–14.5)
WBC: 8.3 10*3/uL (ref 3.6–11.0)

## 2014-02-09 LAB — BASIC METABOLIC PANEL
Anion Gap: 6 — ABNORMAL LOW (ref 7–16)
BUN: 32 mg/dL — ABNORMAL HIGH (ref 7–18)
Calcium, Total: 9.4 mg/dL (ref 8.5–10.1)
Chloride: 95 mmol/L — ABNORMAL LOW (ref 98–107)
Co2: 32 mmol/L (ref 21–32)
Creatinine: 1.41 mg/dL — ABNORMAL HIGH (ref 0.60–1.30)
EGFR (Non-African Amer.): 38 — ABNORMAL LOW
GFR CALC AF AMER: 44 — AB
Glucose: 155 mg/dL — ABNORMAL HIGH (ref 65–99)
Osmolality: 276 (ref 275–301)
POTASSIUM: 4.1 mmol/L (ref 3.5–5.1)
Sodium: 133 mmol/L — ABNORMAL LOW (ref 136–145)

## 2014-02-09 LAB — CBC WITH DIFFERENTIAL/PLATELET
BASOS ABS: 0 10*3/uL (ref 0.0–0.1)
BASOS PCT: 0.2 %
Eosinophil #: 0 10*3/uL (ref 0.0–0.7)
Eosinophil %: 0 %
HCT: 32.6 % — ABNORMAL LOW (ref 35.0–47.0)
HGB: 11.3 g/dL — ABNORMAL LOW (ref 12.0–16.0)
LYMPHS ABS: 0.5 10*3/uL — AB (ref 1.0–3.6)
LYMPHS PCT: 5.1 %
MCH: 32.1 pg (ref 26.0–34.0)
MCHC: 34.5 g/dL (ref 32.0–36.0)
MCV: 93 fL (ref 80–100)
Monocyte #: 0.1 x10 3/mm — ABNORMAL LOW (ref 0.2–0.9)
Monocyte %: 1.6 %
Neutrophil #: 8.5 10*3/uL — ABNORMAL HIGH (ref 1.4–6.5)
Neutrophil %: 93.1 %
PLATELETS: 260 10*3/uL (ref 150–440)
RBC: 3.51 10*6/uL — AB (ref 3.80–5.20)
RDW: 14.1 % (ref 11.5–14.5)
WBC: 9.2 10*3/uL (ref 3.6–11.0)

## 2014-02-13 ENCOUNTER — Encounter: Payer: Self-pay | Admitting: Internal Medicine

## 2014-02-13 LAB — CULTURE, BLOOD (SINGLE)

## 2014-04-11 ENCOUNTER — Inpatient Hospital Stay: Payer: Self-pay | Admitting: Internal Medicine

## 2014-04-11 LAB — CBC
HCT: 37 % (ref 35.0–47.0)
HGB: 12 g/dL (ref 12.0–16.0)
MCH: 30.9 pg (ref 26.0–34.0)
MCHC: 32.5 g/dL (ref 32.0–36.0)
MCV: 95 fL (ref 80–100)
Platelet: 250 10*3/uL (ref 150–440)
RBC: 3.9 10*6/uL (ref 3.80–5.20)
RDW: 14.2 % (ref 11.5–14.5)
WBC: 9.9 10*3/uL (ref 3.6–11.0)

## 2014-04-11 LAB — COMPREHENSIVE METABOLIC PANEL
ALBUMIN: 4.1 g/dL (ref 3.4–5.0)
Alkaline Phosphatase: 88 U/L
Anion Gap: 3 — ABNORMAL LOW (ref 7–16)
BUN: 44 mg/dL — ABNORMAL HIGH (ref 7–18)
Bilirubin,Total: 0.4 mg/dL (ref 0.2–1.0)
CALCIUM: 9.4 mg/dL (ref 8.5–10.1)
Chloride: 98 mmol/L (ref 98–107)
Co2: 30 mmol/L (ref 21–32)
Creatinine: 2.94 mg/dL — ABNORMAL HIGH (ref 0.60–1.30)
EGFR (African American): 18 — ABNORMAL LOW
EGFR (Non-African Amer.): 15 — ABNORMAL LOW
Glucose: 110 mg/dL — ABNORMAL HIGH (ref 65–99)
Osmolality: 274 (ref 275–301)
POTASSIUM: 5.4 mmol/L — AB (ref 3.5–5.1)
SGOT(AST): 22 U/L (ref 15–37)
SGPT (ALT): 17 U/L (ref 12–78)
Sodium: 131 mmol/L — ABNORMAL LOW (ref 136–145)
Total Protein: 7.4 g/dL (ref 6.4–8.2)

## 2014-04-11 LAB — URINALYSIS, COMPLETE
BACTERIA: NONE SEEN
BLOOD: NEGATIVE
Bilirubin,UR: NEGATIVE
GLUCOSE, UR: NEGATIVE mg/dL (ref 0–75)
KETONE: NEGATIVE
LEUKOCYTE ESTERASE: NEGATIVE
NITRITE: NEGATIVE
PH: 7 (ref 4.5–8.0)
Protein: NEGATIVE
RBC,UR: NONE SEEN /HPF (ref 0–5)
SPECIFIC GRAVITY: 1.005 (ref 1.003–1.030)
Squamous Epithelial: <1

## 2014-04-11 LAB — CK-MB
CK-MB: 0.7 ng/mL (ref 0.5–3.6)
CK-MB: 0.9 ng/mL (ref 0.5–3.6)

## 2014-04-11 LAB — TROPONIN I
Troponin-I: 0.08 ng/mL — ABNORMAL HIGH
Troponin-I: 0.07 ng/mL — ABNORMAL HIGH

## 2014-04-12 LAB — CBC WITH DIFFERENTIAL/PLATELET
BASOS ABS: 0 10*3/uL (ref 0.0–0.1)
BASOS PCT: 0.4 %
EOS PCT: 0.4 %
Eosinophil #: 0 10*3/uL (ref 0.0–0.7)
HCT: 33.6 % — ABNORMAL LOW (ref 35.0–47.0)
HGB: 10.8 g/dL — ABNORMAL LOW (ref 12.0–16.0)
Lymphocyte #: 1.1 10*3/uL (ref 1.0–3.6)
Lymphocyte %: 14.5 %
MCH: 30.9 pg (ref 26.0–34.0)
MCHC: 32.2 g/dL (ref 32.0–36.0)
MCV: 96 fL (ref 80–100)
MONOS PCT: 7.5 %
Monocyte #: 0.6 x10 3/mm (ref 0.2–0.9)
NEUTROS ABS: 5.7 10*3/uL (ref 1.4–6.5)
Neutrophil %: 77.2 %
Platelet: 226 10*3/uL (ref 150–440)
RBC: 3.5 10*6/uL — ABNORMAL LOW (ref 3.80–5.20)
RDW: 14.2 % (ref 11.5–14.5)
WBC: 7.4 10*3/uL (ref 3.6–11.0)

## 2014-04-12 LAB — TROPONIN I: Troponin-I: 0.07 ng/mL — ABNORMAL HIGH

## 2014-04-12 LAB — BASIC METABOLIC PANEL
Anion Gap: 2 — ABNORMAL LOW (ref 7–16)
BUN: 33 mg/dL — ABNORMAL HIGH (ref 7–18)
CHLORIDE: 104 mmol/L (ref 98–107)
Calcium, Total: 8.9 mg/dL (ref 8.5–10.1)
Co2: 28 mmol/L (ref 21–32)
Creatinine: 2 mg/dL — ABNORMAL HIGH (ref 0.60–1.30)
EGFR (Non-African Amer.): 25 — ABNORMAL LOW
GFR CALC AF AMER: 29 — AB
Glucose: 140 mg/dL — ABNORMAL HIGH (ref 65–99)
Osmolality: 278 (ref 275–301)
Potassium: 4.6 mmol/L (ref 3.5–5.1)
Sodium: 134 mmol/L — ABNORMAL LOW (ref 136–145)

## 2014-04-12 LAB — MAGNESIUM: Magnesium: 2.2 mg/dL

## 2014-04-12 LAB — PROTEIN / CREATININE RATIO, URINE
Creatinine, Urine: 71.5 mg/dL (ref 30.0–125.0)
Protein, Random Urine: 14 mg/dL — ABNORMAL HIGH (ref 0–12)
Protein/Creat. Ratio: 196 mg/gCREAT (ref 0–200)

## 2014-04-12 LAB — CK-MB: CK-MB: 0.8 ng/mL (ref 0.5–3.6)

## 2014-04-13 LAB — BASIC METABOLIC PANEL
Anion Gap: 7 (ref 7–16)
BUN: 20 mg/dL — ABNORMAL HIGH (ref 7–18)
CHLORIDE: 102 mmol/L (ref 98–107)
CREATININE: 1.25 mg/dL (ref 0.60–1.30)
Calcium, Total: 9.1 mg/dL (ref 8.5–10.1)
Co2: 28 mmol/L (ref 21–32)
EGFR (African American): 50 — ABNORMAL LOW
GFR CALC NON AF AMER: 44 — AB
GLUCOSE: 99 mg/dL (ref 65–99)
Osmolality: 276 (ref 275–301)
POTASSIUM: 4.6 mmol/L (ref 3.5–5.1)
SODIUM: 137 mmol/L (ref 136–145)

## 2014-04-15 LAB — UR PROT ELECTROPHORESIS, URINE RANDOM

## 2014-04-15 LAB — PROTEIN ELECTROPHORESIS(ARMC)

## 2014-05-05 ENCOUNTER — Emergency Department: Payer: Self-pay | Admitting: Emergency Medicine

## 2014-05-05 LAB — URINALYSIS, COMPLETE
BILIRUBIN, UR: NEGATIVE
Glucose,UR: NEGATIVE mg/dL (ref 0–75)
Ketone: NEGATIVE
LEUKOCYTE ESTERASE: NEGATIVE
Nitrite: NEGATIVE
Ph: 7 (ref 4.5–8.0)
Protein: NEGATIVE
RBC,UR: 8 /HPF (ref 0–5)
SPECIFIC GRAVITY: 1.006 (ref 1.003–1.030)
Transitional Epi: 1
WBC UR: 5 /HPF (ref 0–5)

## 2014-05-05 LAB — CBC
HCT: 36.2 % (ref 35.0–47.0)
HGB: 12 g/dL (ref 12.0–16.0)
MCH: 31.5 pg (ref 26.0–34.0)
MCHC: 33.2 g/dL (ref 32.0–36.0)
MCV: 95 fL (ref 80–100)
Platelet: 220 10*3/uL (ref 150–440)
RBC: 3.82 10*6/uL (ref 3.80–5.20)
RDW: 14.3 % (ref 11.5–14.5)
WBC: 11.4 10*3/uL — ABNORMAL HIGH (ref 3.6–11.0)

## 2014-05-05 LAB — COMPREHENSIVE METABOLIC PANEL
ALK PHOS: 93 U/L
ANION GAP: 8 (ref 7–16)
AST: 12 U/L — AB (ref 15–37)
Albumin: 3.5 g/dL (ref 3.4–5.0)
BUN: 32 mg/dL — AB (ref 7–18)
Bilirubin,Total: 0.5 mg/dL (ref 0.2–1.0)
CALCIUM: 9 mg/dL (ref 8.5–10.1)
CHLORIDE: 96 mmol/L — AB (ref 98–107)
Co2: 32 mmol/L (ref 21–32)
Creatinine: 1.29 mg/dL (ref 0.60–1.30)
EGFR (African American): 49 — ABNORMAL LOW
GFR CALC NON AF AMER: 42 — AB
Glucose: 104 mg/dL — ABNORMAL HIGH (ref 65–99)
OSMOLALITY: 279 (ref 275–301)
POTASSIUM: 3.6 mmol/L (ref 3.5–5.1)
SGPT (ALT): 13 U/L (ref 12–78)
SODIUM: 136 mmol/L (ref 136–145)
Total Protein: 6.8 g/dL (ref 6.4–8.2)

## 2014-05-05 LAB — MAGNESIUM: MAGNESIUM: 2.3 mg/dL

## 2014-05-05 LAB — PRO B NATRIURETIC PEPTIDE: B-Type Natriuretic Peptide: 564 pg/mL — ABNORMAL HIGH (ref 0–125)

## 2014-05-05 LAB — TROPONIN I: Troponin-I: <0.02 ng/mL

## 2014-05-09 ENCOUNTER — Emergency Department: Payer: Self-pay | Admitting: Internal Medicine

## 2014-05-09 LAB — CBC
HCT: 37.5 % (ref 35.0–47.0)
HGB: 12.2 g/dL (ref 12.0–16.0)
MCH: 31.1 pg (ref 26.0–34.0)
MCHC: 32.6 g/dL (ref 32.0–36.0)
MCV: 96 fL (ref 80–100)
Platelet: 214 10*3/uL (ref 150–440)
RBC: 3.92 10*6/uL (ref 3.80–5.20)
RDW: 14.5 % (ref 11.5–14.5)
WBC: 9.9 10*3/uL (ref 3.6–11.0)

## 2014-05-09 LAB — COMPREHENSIVE METABOLIC PANEL
ALK PHOS: 94 U/L
ALT: 18 U/L (ref 12–78)
Albumin: 3.4 g/dL (ref 3.4–5.0)
Anion Gap: 8 (ref 7–16)
BUN: 25 mg/dL — ABNORMAL HIGH (ref 7–18)
Bilirubin,Total: 0.5 mg/dL (ref 0.2–1.0)
CALCIUM: 9.6 mg/dL (ref 8.5–10.1)
CHLORIDE: 98 mmol/L (ref 98–107)
CO2: 31 mmol/L (ref 21–32)
Creatinine: 1.01 mg/dL (ref 0.60–1.30)
EGFR (African American): >60
EGFR (Non-African Amer.): 56 — ABNORMAL LOW
Glucose: 89 mg/dL (ref 65–99)
Osmolality: 278 (ref 275–301)
Potassium: 4.3 mmol/L (ref 3.5–5.1)
SGOT(AST): 45 U/L — ABNORMAL HIGH (ref 15–37)
Sodium: 137 mmol/L (ref 136–145)
Total Protein: 7 g/dL (ref 6.4–8.2)

## 2014-05-09 LAB — URINALYSIS, COMPLETE
BLOOD: NEGATIVE
Bacteria: NONE SEEN
Bilirubin,UR: NEGATIVE
Glucose,UR: NEGATIVE mg/dL (ref 0–75)
KETONE: NEGATIVE
Leukocyte Esterase: NEGATIVE
Nitrite: NEGATIVE
Ph: 6 (ref 4.5–8.0)
Protein: NEGATIVE
RBC,UR: NONE SEEN /HPF (ref 0–5)
SPECIFIC GRAVITY: 1.012 (ref 1.003–1.030)
Squamous Epithelial: 4
WBC UR: 1 /HPF (ref 0–5)

## 2014-05-09 LAB — PRO B NATRIURETIC PEPTIDE: B-Type Natriuretic Peptide: 464 pg/mL — ABNORMAL HIGH (ref 0–125)

## 2014-05-09 LAB — TSH: Thyroid Stimulating Horm: 1.11 u[IU]/mL

## 2014-05-09 LAB — TROPONIN I: Troponin-I: <0.02 ng/mL

## 2014-05-10 ENCOUNTER — Inpatient Hospital Stay: Payer: Self-pay | Admitting: Internal Medicine

## 2014-05-10 LAB — BASIC METABOLIC PANEL
ANION GAP: 6 — AB (ref 7–16)
BUN: 21 mg/dL — ABNORMAL HIGH (ref 7–18)
Calcium, Total: 8.9 mg/dL (ref 8.5–10.1)
Chloride: 97 mmol/L — ABNORMAL LOW (ref 98–107)
Co2: 36 mmol/L — ABNORMAL HIGH (ref 21–32)
Creatinine: 1.33 mg/dL — ABNORMAL HIGH (ref 0.60–1.30)
EGFR (African American): 47 — ABNORMAL LOW
GFR CALC NON AF AMER: 40 — AB
Glucose: 97 mg/dL (ref 65–99)
OSMOLALITY: 280 (ref 275–301)
Potassium: 3.3 mmol/L — ABNORMAL LOW (ref 3.5–5.1)
SODIUM: 139 mmol/L (ref 136–145)

## 2014-05-10 LAB — TROPONIN I
Troponin-I: <0.02 ng/mL
Troponin-I: <0.02 ng/mL

## 2014-05-10 LAB — CBC
HCT: 35.5 % (ref 35.0–47.0)
HGB: 11.5 g/dL — ABNORMAL LOW (ref 12.0–16.0)
MCH: 30.7 pg (ref 26.0–34.0)
MCHC: 32.3 g/dL (ref 32.0–36.0)
MCV: 95 fL (ref 80–100)
Platelet: 221 10*3/uL (ref 150–440)
RBC: 3.74 10*6/uL — ABNORMAL LOW (ref 3.80–5.20)
RDW: 14 % (ref 11.5–14.5)
WBC: 11.4 10*3/uL — AB (ref 3.6–11.0)

## 2014-05-10 LAB — CK-MB
CK-MB: <0.5 ng/mL — ABNORMAL LOW (ref 0.5–3.6)
CK-MB: <0.5 ng/mL — ABNORMAL LOW (ref 0.5–3.6)

## 2014-05-11 LAB — CBC WITH DIFFERENTIAL/PLATELET
BASOS ABS: 0 10*3/uL (ref 0.0–0.1)
BASOS PCT: 0.4 %
Eosinophil #: 0 10*3/uL (ref 0.0–0.7)
Eosinophil %: 0.1 %
HCT: 36 % (ref 35.0–47.0)
HGB: 11.8 g/dL — ABNORMAL LOW (ref 12.0–16.0)
LYMPHS ABS: 0.5 10*3/uL — AB (ref 1.0–3.6)
LYMPHS PCT: 6 %
MCH: 30.9 pg (ref 26.0–34.0)
MCHC: 32.7 g/dL (ref 32.0–36.0)
MCV: 95 fL (ref 80–100)
MONO ABS: 0.1 x10 3/mm — AB (ref 0.2–0.9)
Monocyte %: 1.7 %
NEUTROS ABS: 8 10*3/uL — AB (ref 1.4–6.5)
Neutrophil %: 91.8 %
PLATELETS: 219 10*3/uL (ref 150–440)
RBC: 3.81 10*6/uL (ref 3.80–5.20)
RDW: 14.1 % (ref 11.5–14.5)
WBC: 8.8 10*3/uL (ref 3.6–11.0)

## 2014-05-11 LAB — COMPREHENSIVE METABOLIC PANEL
ALK PHOS: 100 U/L
AST: 11 U/L — AB (ref 15–37)
Albumin: 3.2 g/dL — ABNORMAL LOW (ref 3.4–5.0)
Anion Gap: 8 (ref 7–16)
BILIRUBIN TOTAL: 0.5 mg/dL (ref 0.2–1.0)
BUN: 20 mg/dL — ABNORMAL HIGH (ref 7–18)
CALCIUM: 9.2 mg/dL (ref 8.5–10.1)
CHLORIDE: 97 mmol/L — AB (ref 98–107)
Co2: 34 mmol/L — ABNORMAL HIGH (ref 21–32)
Creatinine: 1.17 mg/dL (ref 0.60–1.30)
EGFR (African American): 55 — ABNORMAL LOW
GFR CALC NON AF AMER: 47 — AB
Glucose: 141 mg/dL — ABNORMAL HIGH (ref 65–99)
OSMOLALITY: 283 (ref 275–301)
Potassium: 3.8 mmol/L (ref 3.5–5.1)
SGPT (ALT): 14 U/L (ref 12–78)
Sodium: 139 mmol/L (ref 136–145)
TOTAL PROTEIN: 6.7 g/dL (ref 6.4–8.2)

## 2014-05-11 LAB — TROPONIN I

## 2014-05-11 LAB — CK-MB

## 2014-05-13 LAB — CREATININE, SERUM
CREATININE: 1.71 mg/dL — AB (ref 0.60–1.30)
EGFR (Non-African Amer.): 30 — ABNORMAL LOW
GFR CALC AF AMER: 35 — AB

## 2014-05-14 LAB — CULTURE, BLOOD (SINGLE)

## 2014-05-19 ENCOUNTER — Emergency Department: Payer: Self-pay | Admitting: Emergency Medicine

## 2014-05-19 LAB — CBC WITH DIFFERENTIAL/PLATELET
BASOS PCT: 0.5 %
Basophil #: 0.1 10*3/uL (ref 0.0–0.1)
Eosinophil #: 0.1 10*3/uL (ref 0.0–0.7)
Eosinophil %: 0.4 %
HCT: 35.8 % (ref 35.0–47.0)
HGB: 11.7 g/dL — ABNORMAL LOW (ref 12.0–16.0)
Lymphocyte #: 1.9 10*3/uL (ref 1.0–3.6)
Lymphocyte %: 13 %
MCH: 30.5 pg (ref 26.0–34.0)
MCHC: 32.6 g/dL (ref 32.0–36.0)
MCV: 93 fL (ref 80–100)
MONO ABS: 1.1 x10 3/mm — AB (ref 0.2–0.9)
Monocyte %: 7.3 %
Neutrophil #: 11.4 10*3/uL — ABNORMAL HIGH (ref 1.4–6.5)
Neutrophil %: 78.8 %
PLATELETS: 257 10*3/uL (ref 150–440)
RBC: 3.84 10*6/uL (ref 3.80–5.20)
RDW: 14.1 % (ref 11.5–14.5)
WBC: 14.5 10*3/uL — ABNORMAL HIGH (ref 3.6–11.0)

## 2014-05-19 LAB — BASIC METABOLIC PANEL
ANION GAP: 6 — AB (ref 7–16)
BUN: 34 mg/dL — AB (ref 7–18)
CALCIUM: 9.3 mg/dL (ref 8.5–10.1)
CHLORIDE: 99 mmol/L (ref 98–107)
CREATININE: 1.58 mg/dL — AB (ref 0.60–1.30)
Co2: 28 mmol/L (ref 21–32)
EGFR (African American): 38 — ABNORMAL LOW
EGFR (Non-African Amer.): 33 — ABNORMAL LOW
Glucose: 98 mg/dL (ref 65–99)
Osmolality: 274 (ref 275–301)
POTASSIUM: 4.2 mmol/L (ref 3.5–5.1)
Sodium: 133 mmol/L — ABNORMAL LOW (ref 136–145)

## 2014-05-19 LAB — TROPONIN I

## 2014-05-20 LAB — URINALYSIS, COMPLETE
BACTERIA: NONE SEEN
BLOOD: NEGATIVE
Bilirubin,UR: NEGATIVE
Glucose,UR: NEGATIVE mg/dL (ref 0–75)
KETONE: NEGATIVE
LEUKOCYTE ESTERASE: NEGATIVE
Nitrite: NEGATIVE
PH: 6 (ref 4.5–8.0)
Protein: NEGATIVE
RBC, UR: NONE SEEN /HPF (ref 0–5)
Specific Gravity: 1.008 (ref 1.003–1.030)

## 2014-07-03 IMAGING — CR DG CHEST 1V PORT
1 series · 1 of 1 positions shown · non-contrast
Comparison: none

REASON FOR EXAM: shortness of breath
COMMENTS:

[ap]
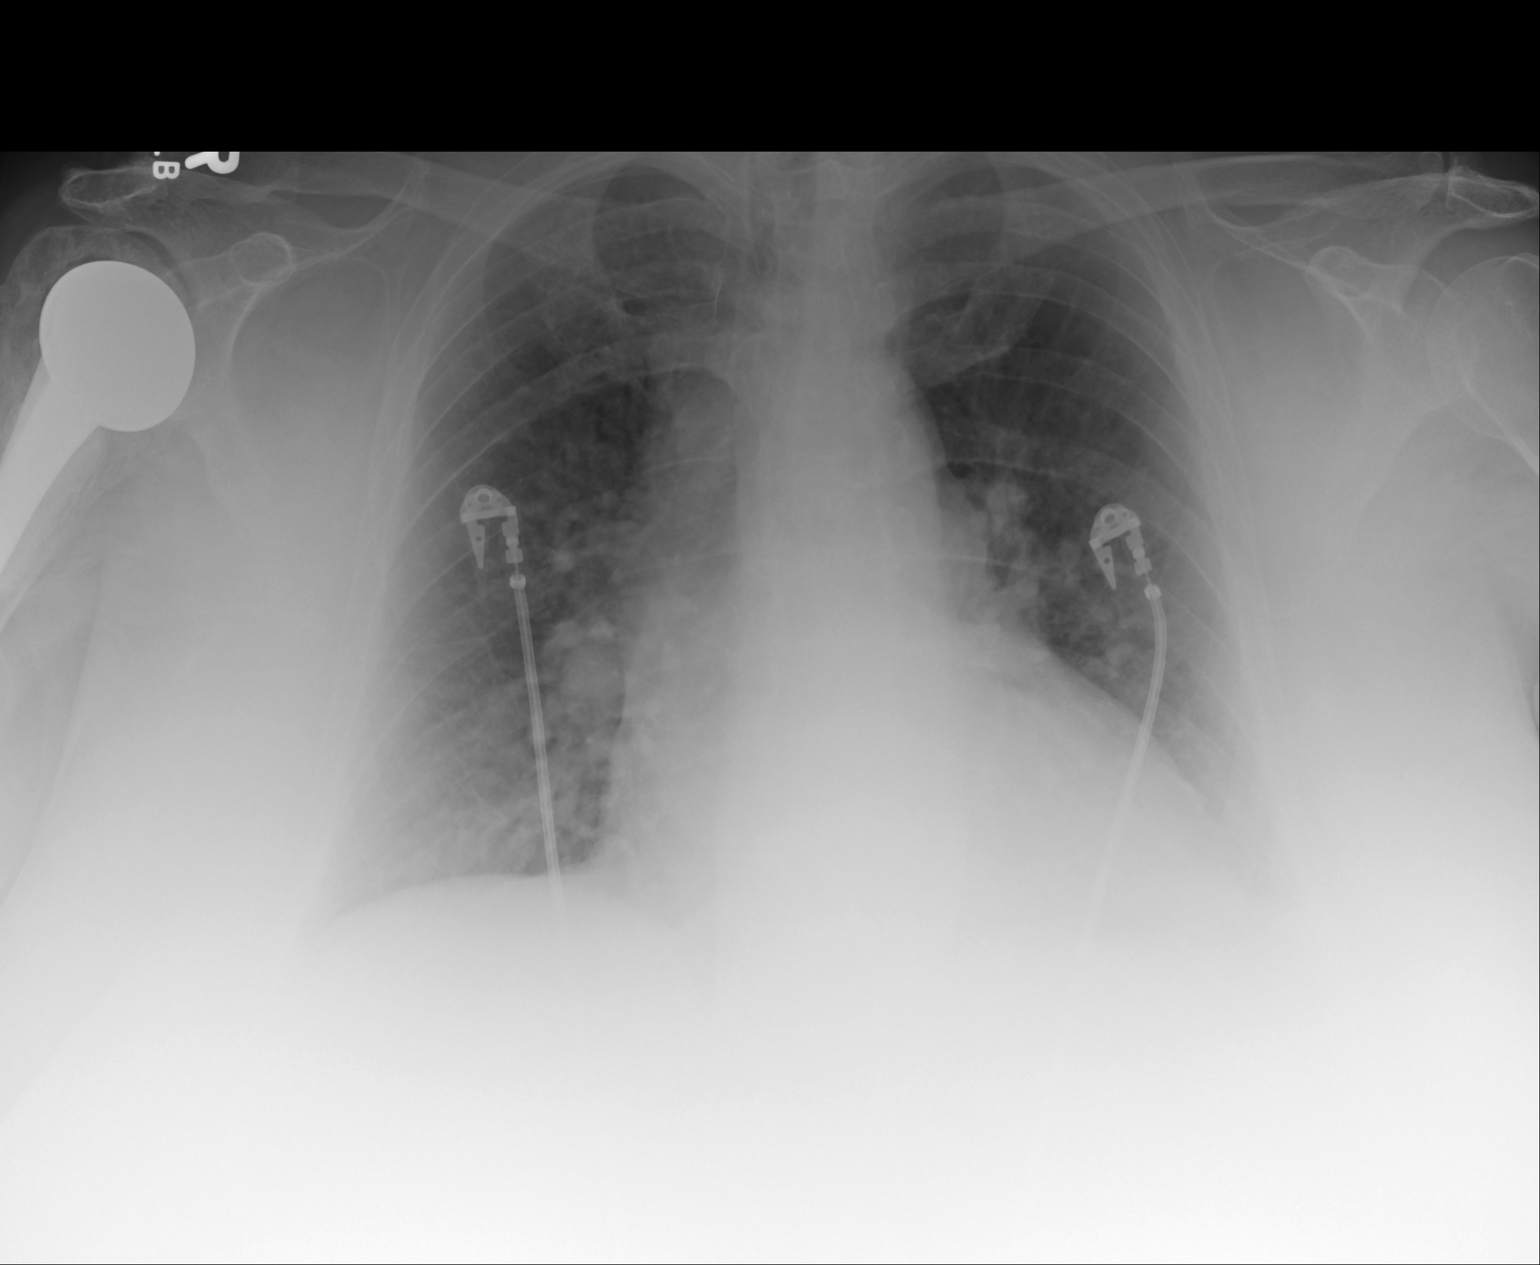

[1 of 1 positions shown; findings below may reference images not displayed]

PROCEDURE:     DXR - DXR PORTABLE CHEST SINGLE VIEW  - November 23, 2012  [DATE]

RESULT:     Comparison is made to the study 02 March, 2012. The heart is
mildly enlarged. The lung interstitial markings are increased suggestive of
interstitial infiltrate of edematous or nonedematous origin. Is no
significant effusion, mass or pneumothorax. Bony structures are osteopenic.
Right shoulder arthroplasty changes are present.
IMPRESSION: 1. Cardiomegaly with interstitial prominence could reflect some underlying
edema with shortness of breath secondary to congestive failure. Correlate
clinically.

[REDACTED]

## 2014-09-26 ENCOUNTER — Emergency Department: Payer: Self-pay | Admitting: Emergency Medicine

## 2014-09-26 LAB — COMPREHENSIVE METABOLIC PANEL
ALK PHOS: 106 U/L
ALT: 19 U/L
AST: 16 U/L (ref 15–37)
Albumin: 3.1 g/dL — ABNORMAL LOW (ref 3.4–5.0)
Anion Gap: 7 (ref 7–16)
BUN: 26 mg/dL — ABNORMAL HIGH (ref 7–18)
Bilirubin,Total: 0.3 mg/dL (ref 0.2–1.0)
CREATININE: 1.49 mg/dL — AB (ref 0.60–1.30)
Calcium, Total: 8.7 mg/dL (ref 8.5–10.1)
Chloride: 98 mmol/L (ref 98–107)
Co2: 30 mmol/L (ref 21–32)
EGFR (Non-African Amer.): 37 — ABNORMAL LOW
GFR CALC AF AMER: 44 — AB
GLUCOSE: 96 mg/dL (ref 65–99)
Osmolality: 275 (ref 275–301)
Potassium: 4.3 mmol/L (ref 3.5–5.1)
SODIUM: 135 mmol/L — AB (ref 136–145)
Total Protein: 7.4 g/dL (ref 6.4–8.2)

## 2014-09-26 LAB — CBC
HCT: 37.1 % (ref 35.0–47.0)
HGB: 12.1 g/dL (ref 12.0–16.0)
MCH: 31 pg (ref 26.0–34.0)
MCHC: 32.8 g/dL (ref 32.0–36.0)
MCV: 94 fL (ref 80–100)
Platelet: 281 10*3/uL (ref 150–440)
RBC: 3.92 10*6/uL (ref 3.80–5.20)
RDW: 14 % (ref 11.5–14.5)
WBC: 14.8 10*3/uL — AB (ref 3.6–11.0)

## 2014-09-26 LAB — CK TOTAL AND CKMB (NOT AT ARMC): CK, TOTAL: 27 U/L

## 2014-09-26 LAB — PRO B NATRIURETIC PEPTIDE: B-Type Natriuretic Peptide: 568 pg/mL — ABNORMAL HIGH (ref 0–125)

## 2014-09-26 LAB — TROPONIN I: Troponin-I: <0.02 ng/mL

## 2014-10-05 ENCOUNTER — Inpatient Hospital Stay: Payer: Self-pay | Admitting: Internal Medicine

## 2014-10-05 LAB — CBC WITH DIFFERENTIAL/PLATELET
BASOS ABS: 0.1 10*3/uL (ref 0.0–0.1)
BASOS PCT: 0.5 %
Eosinophil #: 0.1 10*3/uL (ref 0.0–0.7)
Eosinophil %: 0.6 %
HCT: 39.6 % (ref 35.0–47.0)
HGB: 12.9 g/dL (ref 12.0–16.0)
LYMPHS ABS: 1.8 10*3/uL (ref 1.0–3.6)
Lymphocyte %: 12.3 %
MCH: 30.6 pg (ref 26.0–34.0)
MCHC: 32.5 g/dL (ref 32.0–36.0)
MCV: 94 fL (ref 80–100)
Monocyte #: 0.9 x10 3/mm (ref 0.2–0.9)
Monocyte %: 6.3 %
NEUTROS ABS: 11.8 10*3/uL — AB (ref 1.4–6.5)
Neutrophil %: 80.3 %
Platelet: 264 10*3/uL (ref 150–440)
RBC: 4.2 10*6/uL (ref 3.80–5.20)
RDW: 14.2 % (ref 11.5–14.5)
WBC: 14.7 10*3/uL — ABNORMAL HIGH (ref 3.6–11.0)

## 2014-10-05 LAB — COMPREHENSIVE METABOLIC PANEL
ALBUMIN: 3.4 g/dL (ref 3.4–5.0)
AST: 20 U/L (ref 15–37)
Alkaline Phosphatase: 92 U/L
Anion Gap: 5 — ABNORMAL LOW (ref 7–16)
BUN: 25 mg/dL — ABNORMAL HIGH (ref 7–18)
Bilirubin,Total: 0.4 mg/dL (ref 0.2–1.0)
CO2: 33 mmol/L — AB (ref 21–32)
Calcium, Total: 9 mg/dL (ref 8.5–10.1)
Chloride: 99 mmol/L (ref 98–107)
Creatinine: 1.29 mg/dL (ref 0.60–1.30)
EGFR (Non-African Amer.): 43 — ABNORMAL LOW
GFR CALC AF AMER: 52 — AB
GLUCOSE: 117 mg/dL — AB (ref 65–99)
Osmolality: 279 (ref 275–301)
POTASSIUM: 4.7 mmol/L (ref 3.5–5.1)
SGPT (ALT): 17 U/L
SODIUM: 137 mmol/L (ref 136–145)
Total Protein: 7.1 g/dL (ref 6.4–8.2)

## 2014-10-05 LAB — PRO B NATRIURETIC PEPTIDE: B-TYPE NATIURETIC PEPTID: 306 pg/mL — AB (ref 0–125)

## 2014-10-06 LAB — BASIC METABOLIC PANEL
Anion Gap: 7 (ref 7–16)
BUN: 28 mg/dL — AB (ref 7–18)
Calcium, Total: 9 mg/dL (ref 8.5–10.1)
Chloride: 100 mmol/L (ref 98–107)
Co2: 29 mmol/L (ref 21–32)
Creatinine: 1.27 mg/dL (ref 0.60–1.30)
EGFR (Non-African Amer.): 44 — ABNORMAL LOW
GFR CALC AF AMER: 53 — AB
Glucose: 220 mg/dL — ABNORMAL HIGH (ref 65–99)
Osmolality: 284 (ref 275–301)
Potassium: 4.8 mmol/L (ref 3.5–5.1)
Sodium: 136 mmol/L (ref 136–145)

## 2014-10-06 LAB — CBC WITH DIFFERENTIAL/PLATELET
Basophil #: 0 10*3/uL (ref 0.0–0.1)
Basophil %: 0.1 %
EOS ABS: 0 10*3/uL (ref 0.0–0.7)
Eosinophil %: 0 %
HCT: 37.4 % (ref 35.0–47.0)
HGB: 12.3 g/dL (ref 12.0–16.0)
LYMPHS ABS: 0.6 10*3/uL — AB (ref 1.0–3.6)
LYMPHS PCT: 5.5 %
MCH: 31.3 pg (ref 26.0–34.0)
MCHC: 32.8 g/dL (ref 32.0–36.0)
MCV: 96 fL (ref 80–100)
MONO ABS: 0.1 x10 3/mm — AB (ref 0.2–0.9)
Monocyte %: 1 %
NEUTROS ABS: 9.5 10*3/uL — AB (ref 1.4–6.5)
Neutrophil %: 93.4 %
PLATELETS: 224 10*3/uL (ref 150–440)
RBC: 3.92 10*6/uL (ref 3.80–5.20)
RDW: 14.7 % — AB (ref 11.5–14.5)
WBC: 10.1 10*3/uL (ref 3.6–11.0)

## 2014-10-08 LAB — EXPECTORATED SPUTUM ASSESSMENT W REFEX TO RESP CULTURE

## 2014-10-10 LAB — CULTURE, BLOOD (SINGLE)

## 2014-10-31 ENCOUNTER — Ambulatory Visit: Payer: Self-pay | Admitting: Oncology

## 2014-10-31 LAB — CBC CANCER CENTER
BASOS PCT: 0.4 %
Basophil #: 0 x10 3/mm (ref 0.0–0.1)
EOS ABS: 0.1 x10 3/mm (ref 0.0–0.7)
EOS PCT: 0.6 %
HCT: 38.4 % (ref 35.0–47.0)
HGB: 12.6 g/dL (ref 12.0–16.0)
LYMPHS ABS: 1.8 x10 3/mm (ref 1.0–3.6)
LYMPHS PCT: 18.1 %
MCH: 31.2 pg (ref 26.0–34.0)
MCHC: 32.8 g/dL (ref 32.0–36.0)
MCV: 95 fL (ref 80–100)
MONO ABS: 0.9 x10 3/mm (ref 0.2–0.9)
MONOS PCT: 8.7 %
Neutrophil #: 7.2 x10 3/mm — ABNORMAL HIGH (ref 1.4–6.5)
Neutrophil %: 72.2 %
PLATELETS: 257 x10 3/mm (ref 150–440)
RBC: 4.04 10*6/uL (ref 3.80–5.20)
RDW: 14.3 % (ref 11.5–14.5)
WBC: 9.9 x10 3/mm (ref 3.6–11.0)

## 2014-10-31 LAB — LACTATE DEHYDROGENASE: LDH: 169 U/L (ref 81–246)

## 2014-11-12 ENCOUNTER — Ambulatory Visit: Payer: Self-pay | Admitting: Oncology

## 2015-01-14 ENCOUNTER — Inpatient Hospital Stay: Payer: Self-pay | Admitting: Internal Medicine

## 2015-01-14 LAB — TROPONIN I: Troponin-I: <0.02 ng/mL

## 2015-01-14 LAB — BASIC METABOLIC PANEL
Anion Gap: 7 (ref 7–16)
BUN: 28 mg/dL — ABNORMAL HIGH (ref 7–18)
CREATININE: 1.06 mg/dL (ref 0.60–1.30)
Calcium, Total: 9.1 mg/dL (ref 8.5–10.1)
Chloride: 101 mmol/L (ref 98–107)
Co2: 29 mmol/L (ref 21–32)
GFR CALC NON AF AMER: 54 — AB
Glucose: 102 mg/dL — ABNORMAL HIGH (ref 65–99)
Osmolality: 279 (ref 275–301)
POTASSIUM: 4.4 mmol/L (ref 3.5–5.1)
SODIUM: 137 mmol/L (ref 136–145)

## 2015-01-14 LAB — PRO B NATRIURETIC PEPTIDE: B-TYPE NATIURETIC PEPTID: 287 pg/mL — AB (ref 0–125)

## 2015-01-14 LAB — CBC
HCT: 36 % (ref 35.0–47.0)
HGB: 12.2 g/dL (ref 12.0–16.0)
MCH: 31.4 pg (ref 26.0–34.0)
MCHC: 33.8 g/dL (ref 32.0–36.0)
MCV: 93 fL (ref 80–100)
Platelet: 221 10*3/uL (ref 150–440)
RBC: 3.87 10*6/uL (ref 3.80–5.20)
RDW: 13.4 % (ref 11.5–14.5)
WBC: 11.7 10*3/uL — AB (ref 3.6–11.0)

## 2015-01-15 LAB — HEMOGLOBIN A1C: Hemoglobin A1C: 5.9 % (ref 4.2–6.3)

## 2015-01-16 LAB — THEOPHYLLINE LEVEL: Theophylline: 4.3 ug/mL — ABNORMAL LOW (ref 10.0–20.0)

## 2015-01-24 ENCOUNTER — Inpatient Hospital Stay: Payer: Self-pay | Admitting: Internal Medicine

## 2015-02-05 ENCOUNTER — Ambulatory Visit: Payer: Self-pay | Admitting: Family Medicine

## 2015-03-06 IMAGING — CR DG CHEST 2V
1 series · 2 of 2 positions shown · non-contrast
Comparison: none

REASON FOR EXAM: SOB
COMMENTS:

PROCEDURE:     DXR - DXR CHEST PA (OR AP) AND LATERAL  - July 27, 2013 [DATE]
RESULT:     Comparison: None

[Series 1: pa · 0.17mm/px · 2 of 2 slices shown]
[im 1/2]
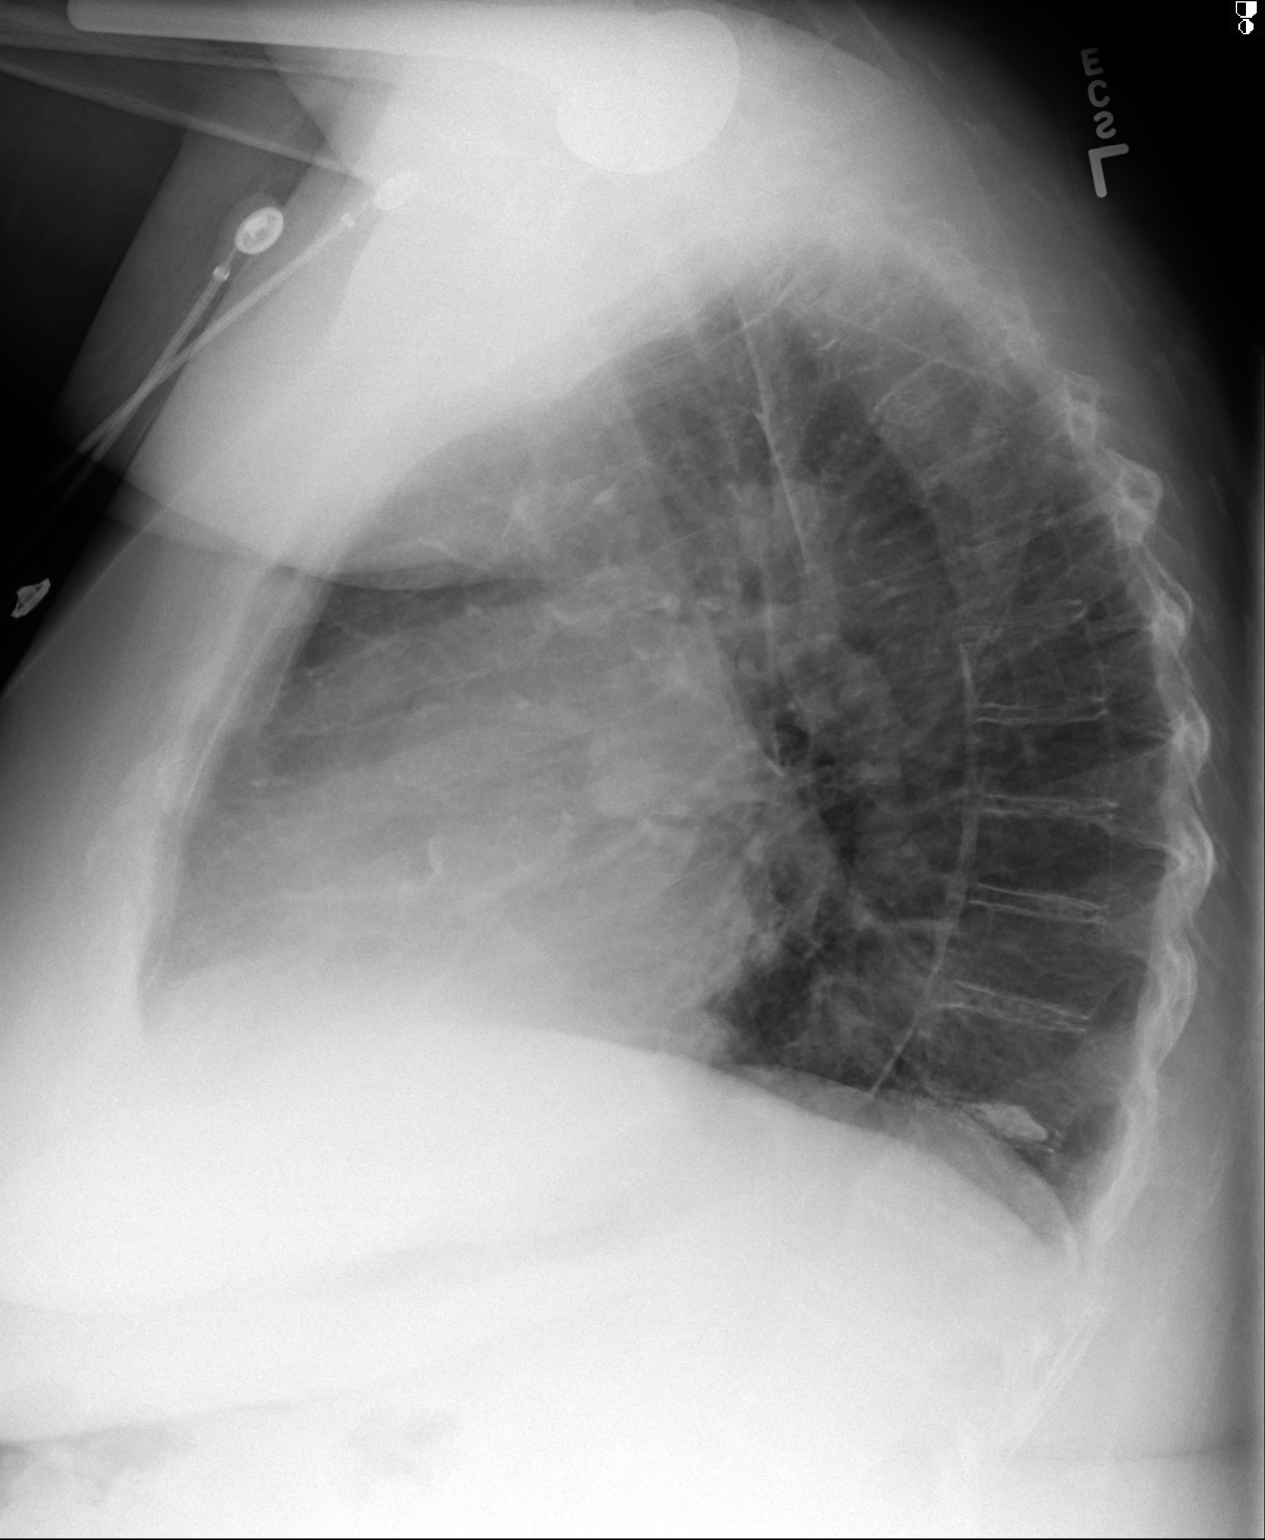
[im 2/2]
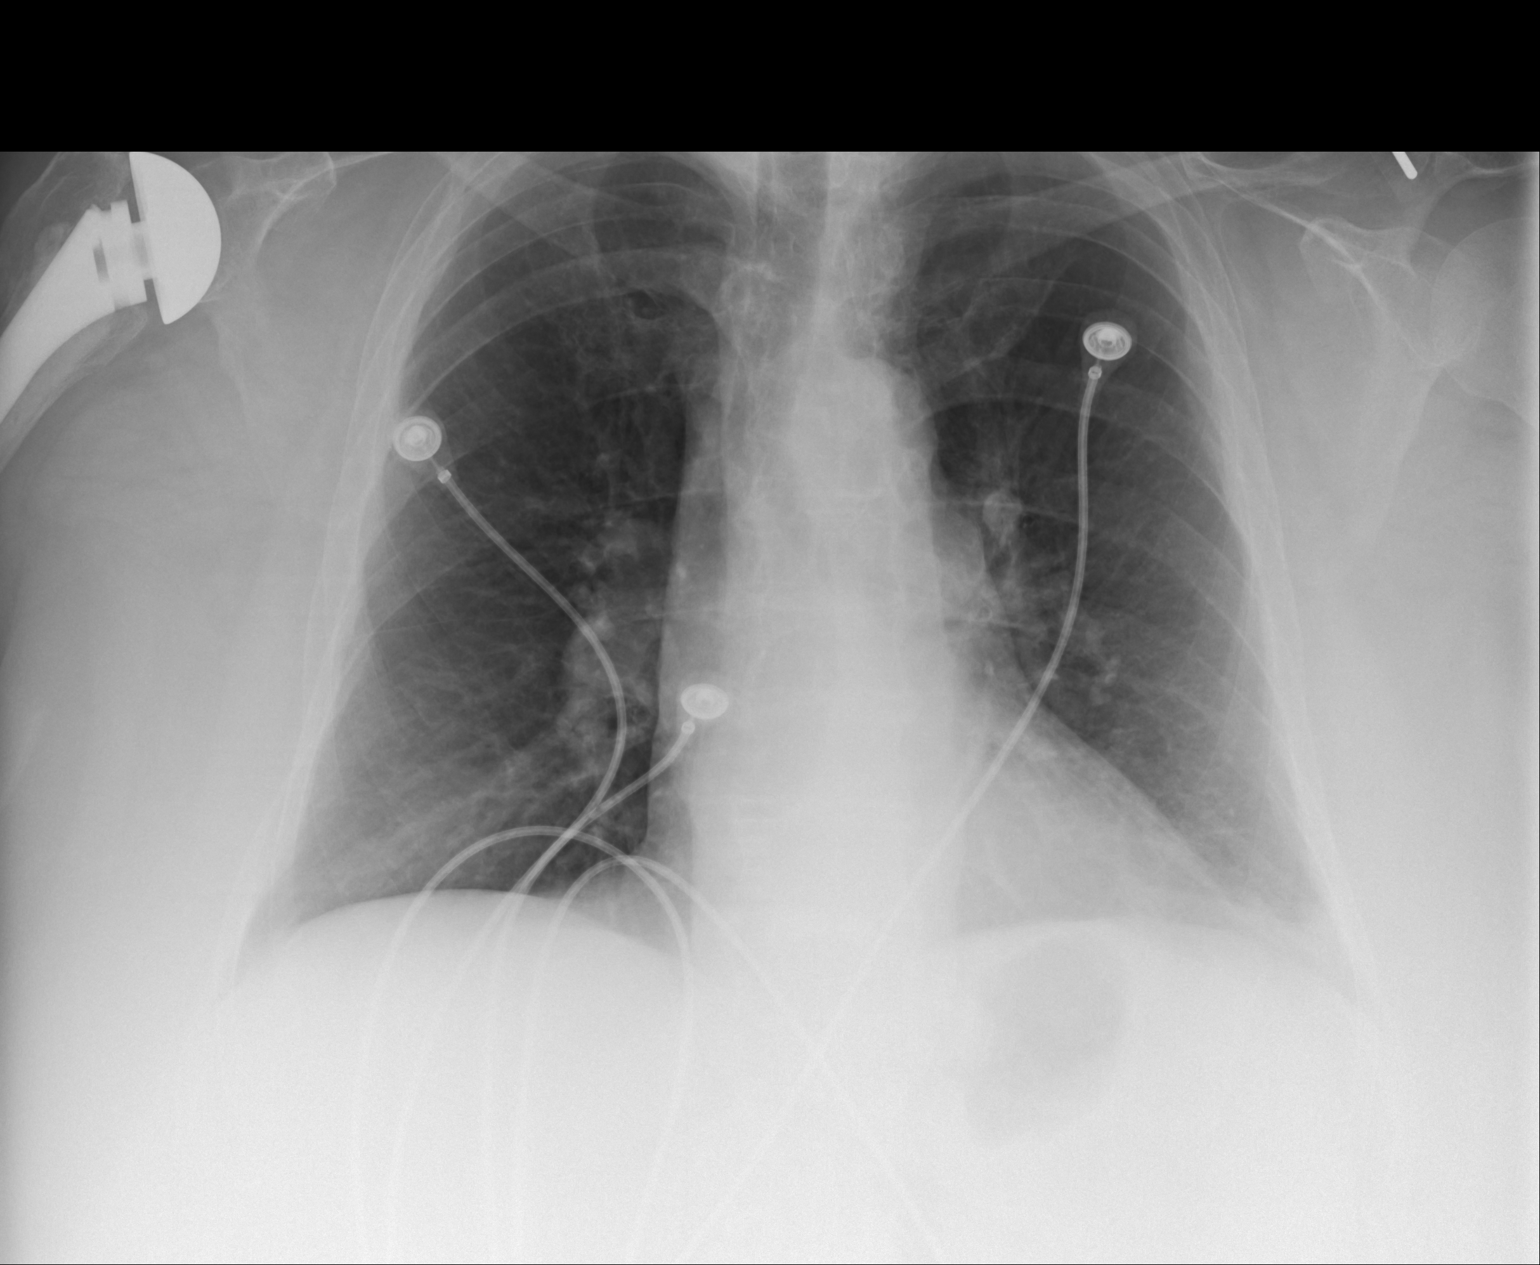

[2 of 2 positions shown; findings below may reference images not displayed]

FINDINGS: PA and lateral chest radiographs are provided.  There is no focal
parenchymal opacity, pleural effusion, or pneumothorax. The heart and
mediastinum are unremarkable.  The osseous structures are unremarkable.
IMPRESSION: No acute disease of the che[REDACTED]

## 2015-03-15 DIAGNOSIS — J449 Chronic obstructive pulmonary disease, unspecified: Secondary | ICD-10-CM | POA: Insufficient documentation

## 2015-03-15 DIAGNOSIS — N3946 Mixed incontinence: Secondary | ICD-10-CM | POA: Insufficient documentation

## 2015-03-15 DIAGNOSIS — B373 Candidiasis of vulva and vagina: Secondary | ICD-10-CM | POA: Insufficient documentation

## 2015-03-15 DIAGNOSIS — I619 Nontraumatic intracerebral hemorrhage, unspecified: Secondary | ICD-10-CM | POA: Insufficient documentation

## 2015-03-15 DIAGNOSIS — E669 Obesity, unspecified: Secondary | ICD-10-CM | POA: Insufficient documentation

## 2015-03-15 DIAGNOSIS — F329 Major depressive disorder, single episode, unspecified: Secondary | ICD-10-CM | POA: Insufficient documentation

## 2015-03-15 DIAGNOSIS — B3731 Acute candidiasis of vulva and vagina: Secondary | ICD-10-CM | POA: Insufficient documentation

## 2015-03-15 DIAGNOSIS — D649 Anemia, unspecified: Secondary | ICD-10-CM | POA: Insufficient documentation

## 2015-03-15 DIAGNOSIS — I10 Essential (primary) hypertension: Secondary | ICD-10-CM | POA: Insufficient documentation

## 2015-03-15 DIAGNOSIS — G4730 Sleep apnea, unspecified: Secondary | ICD-10-CM | POA: Insufficient documentation

## 2015-03-15 DIAGNOSIS — K5909 Other constipation: Secondary | ICD-10-CM | POA: Insufficient documentation

## 2015-03-15 DIAGNOSIS — M1990 Unspecified osteoarthritis, unspecified site: Secondary | ICD-10-CM | POA: Insufficient documentation

## 2015-03-15 DIAGNOSIS — R9431 Abnormal electrocardiogram [ECG] [EKG]: Secondary | ICD-10-CM | POA: Insufficient documentation

## 2015-03-15 DIAGNOSIS — Z8619 Personal history of other infectious and parasitic diseases: Secondary | ICD-10-CM | POA: Insufficient documentation

## 2015-03-15 DIAGNOSIS — H269 Unspecified cataract: Secondary | ICD-10-CM | POA: Insufficient documentation

## 2015-03-15 DIAGNOSIS — N952 Postmenopausal atrophic vaginitis: Secondary | ICD-10-CM | POA: Insufficient documentation

## 2015-03-15 DIAGNOSIS — Q799 Congenital malformation of musculoskeletal system, unspecified: Secondary | ICD-10-CM | POA: Insufficient documentation

## 2015-03-15 DIAGNOSIS — E039 Hypothyroidism, unspecified: Secondary | ICD-10-CM | POA: Insufficient documentation

## 2015-03-15 DIAGNOSIS — I429 Cardiomyopathy, unspecified: Secondary | ICD-10-CM | POA: Insufficient documentation

## 2015-03-15 DIAGNOSIS — R350 Frequency of micturition: Secondary | ICD-10-CM | POA: Insufficient documentation

## 2015-03-15 DIAGNOSIS — I8390 Asymptomatic varicose veins of unspecified lower extremity: Secondary | ICD-10-CM | POA: Insufficient documentation

## 2015-03-15 DIAGNOSIS — E119 Type 2 diabetes mellitus without complications: Secondary | ICD-10-CM | POA: Insufficient documentation

## 2015-03-15 DIAGNOSIS — N189 Chronic kidney disease, unspecified: Secondary | ICD-10-CM | POA: Insufficient documentation

## 2015-03-15 DIAGNOSIS — F32A Depression, unspecified: Secondary | ICD-10-CM | POA: Insufficient documentation

## 2015-03-15 DIAGNOSIS — F419 Anxiety disorder, unspecified: Secondary | ICD-10-CM | POA: Insufficient documentation

## 2015-03-15 DIAGNOSIS — E079 Disorder of thyroid, unspecified: Secondary | ICD-10-CM | POA: Insufficient documentation

## 2015-03-15 DIAGNOSIS — D689 Coagulation defect, unspecified: Secondary | ICD-10-CM | POA: Insufficient documentation

## 2015-04-04 NOTE — H&P (Signed)
PATIENT NAME:  Sara Pierce, Sara Pierce MR#:  [id number] DATE OF BIRTH:  Oct 22, 1941  DATE OF ADMISSION:  09/30/2013  [facility] [id number] 2014.   REFERRING PHYSICIAN: Sheryl L. Benjaman Lobe, MD  FAMILY PHYSICIAN: [facility] in [place].   REASON FOR ADMISSION: Acute respiratory failure.   HISTORY OF PRESENT ILLNESS: The patient is a 74 year old female with a history of obesity, COPD, chronic dermatitis and history of congestive heart failure. The patient has been extensively evaluated recently by both Dr. Nehemiah Massed of cardiology and Dr. Raul Del of pulmonology for her shortness of breath. Presents to the Emergency Room today with a 2 to 3-day history of worsening shortness of breath where she was found to be hypoxic and in moderate respiratory distress. In the Emergency Room, the patient's chest x-ray was nondiagnostic. Cardiac enzymes were negative. She continues to have difficulty breathing and is now admitted for further evaluation.   PAST MEDICAL HISTORY: 1.  Obesity.  2.  Benign hypertension.  3.  Hyperlipidemia.  4.  History of CHF.  5.  COPD.   6.  Chronic dermatitis.  7.  Status post appendectomy.  8.  Venostasis with peripheral edema.   MEDICATIONS: 1.  Aldactone 25 mg p.o. daily.  2.  Spiriva 1 capsule inhaled daily.  3.  Zocor 40 mg p.o. daily.  4.  Quinapril 20 mg p.o. daily.  5.  Wellbutrin XL 300 mg p.o. daily.  6.  Provera 2 puffs q.4 hours p.r.n. shortness of breath.  7.  DuoNeb SVNs q.4 hours p.r.n. shortness of breath.  8.  Lasix 40 mg p.o. daily.  9.  Coreg 6.25 mg p.o. b.i.d.  10.  Advair 500/50 one puff b.i.d.   ALLERGIES: ASPIRIN, CODEINE AND TAPE.   SOCIAL HISTORY: The patient has a history of tobacco abuse but none recently. No history of alcohol abuse.   FAMILY HISTORY: Positive for diabetes and stroke. Otherwise negative for colon or breast cancer. Negative for coronary artery disease.   REVIEW OF SYSTEMS: CONSTITUTIONAL: No fever or change in weight.   EYES: No blurred or double vision. No glaucoma.  ENT: No tinnitus or hearing loss. No nasal discharge or bleeding. No difficulty swallowing.  RESPIRATORY: The patient has had cough and some wheezing. Denies hemoptysis. No painful respiration.  CARDIOVASCULAR: No chest pain or palpitations. No syncope. Some orthopnea.  GASTROINTESTINAL: No nausea, vomiting or diarrhea. No abdominal pain. No change in bowel habits.  GENITOURINARY: No dysuria or hematuria. No incontinence.  ENDOCRINE: No polyuria or polydipsia. No heat or cold intolerance.  HEMATOLOGIC: The patient denies anemia, easy bruising or bleeding.  LYMPHATIC: No swollen glands.  MUSCULOSKELETAL: The patient denies pain in her neck, back, shoulders, knees or hips. No gout.  NEUROLOGIC: No numbness or migraines. Denies stroke or seizures.  PSYCHIATRIC: The patient denies anxiety, insomnia or depression.   PHYSICAL EXAMINATION: GENERAL: The patient is anxious, in moderate respiratory distress.  VITAL SIGNS: Currently remarkable for a blood pressure of 123/60 with a heart rate of 61 and a respiratory rate of 24. Temperature 98.5.  HEENT: Normocephalic, atraumatic. Pupils equally round and reactive to light and accommodation. Extraocular movements are intact. Sclerae are anicteric. Conjunctivae are clear. Oropharynx clear.   NECK: Supple without JVD. No adenopathy or thyromegaly is noted.  LUNGS: Decreased breath sounds with basilar crackles. Diffuse wheezes are noted. No rhonchi. No dullness. Respiratory effort is increased.  CARDIAC: Regular rate and rhythm with a normal S1 and S2. No significant rubs, murmurs or gallops.  PMI is nondisplaced. Chest wall is nontender.  ABDOMEN: Soft, nontender, with normoactive bowel sounds. No organomegaly or masses were appreciated. No hernias or bruits were noted.  EXTREMITIES: Revealed 2+ edema bilaterally. Stasis changes were noted. Pulses were 2+ bilaterally.  SKIN: Warm and dry without rash or  lesions.  NEUROLOGIC: Cranial nerves II through XII grossly intact. Deep tendon reflexes were symmetric. Motor and sensory exam is nonfocal.  PSYCHIATRIC: Revealed a patient who was alert and oriented to person, place and time. She was cooperative and used good judgment.   LABORATORY AND RADIOLOGIC DATA: EKG revealed sinus rhythm with PACs and an incomplete right bundle-branch block but no evidence of acute ischemia. Chest x-ray revealed no acute cardiopulmonary disease. Her pro time was 12.9 with an INR of 1.0. Urinalysis negative. White count 12.6 with a hemoglobin of 12.5. Glucose was 106 with a BUN of 29, creatinine 1.61 with a GFR of 32, sodium 137 with a potassium of 4.3. BNP was 1278. Troponin was less than 0.02.   ASSESSMENT: 1.  Acute respiratory failure.  2.  Chronic obstructive pulmonary disease exacerbation.  3.  Acute bronchitis.  4.  Chronic diastolic congestive heart failure.  5.  Obesity.  6.  Anxiety.  7.  Hyperlipidemia.  8.  Venostasis with peripheral edema.   PLAN: The patient will be admitted to telemetry with oxygen, IV steroids, IV antibiotics and DuoNeb SVNs. We will begin Lovenox at this time and follow serial cardiac enzymes. Will obtain a V/Q scan because her renal function. We will follow up chest x-ray in the morning. We will consult both Dr. Nehemiah Massed of cardiology and Dr. Raul Del pulmonology for further evaluation. Wean oxygen as tolerated. Follow her sugars while on steroids. Continue Wellbutrin for now. Blood cultures have been sent in the Emergency Room. We will obtain an echocardiogram. Further treatment and evaluation will depend upon the patient's progress.   TOTAL TIME SPENT ON THIS PATIENT: 50 minutes.    ____________________________ Leonie Douglas Doy Hutching, MD jds:cs D: 09/30/2013 13:29:14 ET T: 09/30/2013 14:34:55 ET JOB#: [id number]  cc: Leonie Douglas. Doy Hutching, MD, <Dictator> [facility] [place] Thurmon Mizell Lennice Sites MD ELECTRONICALLY SIGNED 10/01/2013 8:13

## 2015-04-04 NOTE — H&P (Signed)
PATIENT NAME:  Sara, Pierce MR#:  [id number] DATE OF BIRTH:  1941/01/21  DATE OF ADMISSION:  10/11/2013  PRIMARY CARE PHYSICIAN: [facility].   REFERRING PHYSICIAN: Dr. Conni Slipper.   REASON FOR ADMISSION: Hypotension.   HISTORY OF PRESENT ILLNESS: This is a very nice 74 year old female with a history of recent admission for COPD exacerbation. Discharged on 10/04/2013. She was treated with steroids, antibiotics and she was actually discharged on Ceftin. The patient qualified for oxygen 2 liters nasal cannula that she has been used. Today, she comes with a history of having her home health nurse checking on her, and she was hypotensive with blood pressures ranging in the 80s/30s. Apparently, she has been her normal self. For the past couple of days, she was feeling fine but this evening developed diarrhea, 3 episodes of significant large amount of stool which was dark and greenish and very liquidy. She started getting really weak, started sweating and almost fainted. She actually has not had any fever and no chills. She was so weak that she was not able to get off the commode the last time that she used it. The patient was elevated in the ER. In the ER, the patient had blood pressures in the 80s/40s. Right now, it is improved in the 100s after IV fluids given. The patient denies any other symptomatology. The patient is admitted for treatment and evaluation of this condition.   PAST MEDICAL HISTORY:  1. Obesity.  2. Benign hypertension.  3. Hyperlipidemia.  4. CHF, diastolic compensated.  5. Pulmonary hypertension.  6. COPD.   7. Chronic dermatitis.  8. Chronic venous insufficiency of the lower extremities.  9. Mild depression, controlled.  10. Chronic respiratory failure, on 2 liters of oxygen.   CURRENT MEDICATIONS: Bupropion 300 mg every 24 hours, simvastatin 40 mg once a day, spironolactone 25 mg once a day, clobetasone topical cream twice daily, furosemide 40 mg once a day, Spiriva 18 mcg  inhaled once daily, ammonia lactate topical cream twice daily, prednisone 10 mg was just finished the last couple of days, quinapril 20 mg take 1/2 tablet once a day, Coreg 3.125 twice daily, Ceftin 500 mg 2 times a day just finished as well, fluticasone spray 2 sprays once a day, Protonix 40 mg twice daily, guaifenesin with pseudoephedrine as needed for allergies and congestion, oxygen 2 liters nasal cannula.   ALLERGIES: ASPIRIN, CODEINE AND TAPE.   SOCIAL HISTORY: The patient used to be a heavy smoker. For over 40 years, she smoked 1 pack a day. She quit 8 years ago. She does not drink. Lives with her husband. She is retired.   FAMILY HISTORY: Positive for diabetes and stroke in parents.    PAST SURGICAL HISTORY:  1. Appendectomy.  2. Cardiac catheterization.   REVIEW OF SYSTEMS: A 12 system review of systems done.  CONSTITUTIONAL: No fever, fatigue. Positive weakness.  EYES: No blurry vision, double vision.  EARS, NOSE, THROAT: No difficulty swallowing. No epistaxis.  RESPIRATORY: Positive chronic cough. Positive chronic shortness of breath, on 2 liters of oxygen. No hemoptysis. No increased secretions recently other than previous hospitalization.  CARDIOVASCULAR: No chest pain, orthopnea, paroxysmal nocturnal dyspnea.   GASTROINTESTINAL: No nausea, vomiting, constipation. Positive diarrhea today as mentioned above.  GENITOURINARY: No dysuria, hematuria.  GYNECOLOGIC: No breast masses.  ENDOCRINE: No polyuria, polydipsia, polyphagia, cold or heat intolerance.  HEMATOLOGIC AND LYMPHATIC: No anemia or easy bruising.  SKIN: No rashes or petechiae. The patient has chronic dermatitis which is controlled.  NEUROLOGIC: No numbness, tingling. No CVAs.  PSYCHIATRIC: No insomnia. Positive depression, well uncontrolled.   PHYSICAL EXAMINATION:  VITAL SIGNS: Blood pressure last one 115/45, has been as low as 80/20. Her heart rate has been between 60s and 70s. Temperature 98.2. Respiratory rate of  22.  GENERAL: The patient is alert, oriented x 3, in no acute distress. No respiratory distress. Hemodynamically stable now that her blood pressure is coming up.  HEENT: Pupils are equal and reactive. Extraocular movements are intact. Mucosa is moist. Anicteric sclerae. Pink conjunctivae. No oral lesions. No oropharyngeal exudates.  NECK: Supple. No JVD. No thyromegaly. No adenopathy. No carotid bruits. No rigidity. CARDIOVASCULAR: Regular rate and rhythm. No murmurs, rubs or gallops.  LUNGS: Clear without any wheezing or crepitus. No use of accessory muscles.  ABDOMEN: Soft, nontender, nondistended. No hepatosplenomegaly. Increased sounds. Again, no significant rebound or  tenderness on palpation.  RECTAL: Exam is deferred as the patient had one done by Dr. Cinda Quest. Dr. Cinda Quest states that there was a lot of liquid stool.  EXTREMITIES: No edema, tenderness or clubbing. Signs of chronic venous insufficiency with discoloration over the skin and small variceal veins.  NEUROLOGIC: Cranial nerves II through XII intact. No focal findings. Strength is 5/5 in 4 extremities.  LYMPHATIC: Negative for lymphadenopathy in neck or supraclavicular areas.  SKIN: No rashes or petechiae.  PSYCHIATRIC: No agitation.   RESULTS: Blood sugar 120. BUN is 48. Previous creatinine was 1.66 at discharge from actually a baseline creatinine that was 1.22. Right now, her creatinine is 2.08. Her sodium is low at 124. Previous sodium was 137. Chloride is 90. GFR is around 24. Lipase is 123. LFTs within normal limits. Troponin is negative. White count 18,000, hemoglobin 12. Urinalysis: No signs of urine infection. EKG: No ST depression or elevation. Positive PVCs. Incomplete right bundle branch block.   ASSESSMENT AND PLAN: A 74 year old female with history of obesity, hypertension, hyperlipidemia, congestive heart failure, chronic obstructive pulmonary disease, possible sleep apnea. Comes with a history of hypotension, diarrhea  and weakness.  1. Hypotension: The patient was found to be hypotensive by home health nurse. Was told to come to the Emergency Room. The hypotension could be related to several things:  A. Dehydration due to diarrhea.  B. Medications including blood pressure medications and diuretic.  C. Infection.   The patient barely meets criteria for systemic inflammatory response syndrome with 2 positive criteria, elevation of white blood cells above 12,000 and decreased respiratory rate of 22. The patient actually is not tachycardic, but she has been having occasional chills. No fever at this moment.   The possibility of infection could be Clostridium difficile as the patient has been on steroids and antibiotics on the past admission, recently hospitalized and discharged. We are going to start her on metronidazole. The patient was started on Levaquin here at the Emergency Room, but I do not find any significant reason to put her on levofloxacin. There are no signs of new respiratory infection or urine infection. CT scan of the abdomen was unrevealing, although it was done without contrast, for which if she does have some colitis it would not show. For now, we are going to check Clostridium difficile stat, sending stool to white blood cells and treat empirically with metronidazole.  2. Acute kidney injury: Creatinine of 2.08. Baseline creatinine was around 1.2. At discharge, it was 1.66. Again, hold nephrotoxins. Hold blood pressure medications. Likely, acute tubular necrosis due to low blood pressure and dehydration.  3. Chronic  obstructive pulmonary disease: At this moment seems to be stable and no signs of acute flare. No need for steroids or antibiotics.  4. Possible obstructive sleep apnea: Suspicion as the patient has elevated pulmonary pressures.  5. Diastolic dysfunction: At this moment, she seems compensated. We are going to give her intravenous fluids due to her significant low blood pressures and acute  kidney injury at 75 an hour. Watch closely for fluid overload. Stop intravenous fluids if the patient starts getting short of breath.  6. Chronic respiratory failure: Continue oxygen at 2 liters nasal cannula.  7. Significant hyponatremia: This is acute. This is likely secondary to medication of furosemide or decreased intravascular volume for what we are going to continue intravenous fluids with normal saline. Check urine sodium and urine osmolality.  8. Hyperlipidemia: Continue Zocor.  9. Depression, anxiety: Continue bupropion.  10. As prophylaxis, the patient is going to be put on heparin for prophylaxis of deep vein thrombosis and Protonix for gastrointestinal prophylaxis for stress ulcers.   The patient is admitted.   TIME SPENT: About 50 minutes with this admission.   ____________________________ Bushnell Sink, MD rsg:gb D: 10/11/2013 20:57:11 ET T: 10/11/2013 21:58:36 ET JOB#: [id number]  cc: [place] Sink, MD, <Dictator> Emersyn Wyss America Brown MD ELECTRONICALLY SIGNED 10/31/2013 22:42

## 2015-04-04 NOTE — Consult Note (Signed)
   Present Illness Pt with history of hyhpertension and pulmonary hypertension with obesity and sleep apnea with recent echo revealing preserved lv funciton with moderate tr and pulmnary hypertension. She is now admitted with progressive shortness of breath and peripheral edema. CXR did not reveal evidence of pulmonary edema. She states she is compliant with cpap. She eats a fairly high sodium diet. She has ruled out for an mi.   Physical Exam:  GEN obese   HEENT PERRL, hearing intact to voice   NECK supple   RESP normal resp effort  no use of accessory muscles   CARD Regular rate and rhythm  Murmur   Murmur Systolic   Systolic Murmur axilla   ABD denies tenderness  distended   LYMPH negative neck   EXTR negative cyanosis/clubbing, positive edema   SKIN normal to palpation   NEURO cranial nerves intact, motor/sensory function intact   PSYCH A+O to time, place, person   Review of Systems:  Subjective/Chief Complaint short of breath with peripheral edema   General: Fatigue  Weakness  Trouble sleeping   Skin: No Complaints   ENT: No Complaints   Eyes: No Complaints   Neck: No Complaints   Respiratory: Short of breath   Cardiovascular: Dyspnea  Edema   Gastrointestinal: No Complaints   Genitourinary: No Complaints   Vascular: No Complaints   Musculoskeletal: No Complaints   Neurologic: No Complaints   Hematologic: No Complaints   Endocrine: No Complaints   Psychiatric: No Complaints   Review of Systems: All other systems were reviewed and found to be negative   Medications/Allergies Reviewed Medications/Allergies reviewed        Admit Reason:   Chronic diastolic congestive heart failure, NYHA class 1 (428.32): Status: Active, Coding System: ICD9, Coded Name: Chronic diastolic heart failure  EKG:  Abnormal NSSTTW changes   Interpretation no ischemia    Aspirin: Swelling  Codeine: Itching  Tape: Unknown   Impression 74 yo female with history  of hypertension and copd with sleep apnea. Has history of normal lv funciton with mooderate pullmonary hypertension by outptatient echo. Now admittd with progressive shortness of breath . CXR did not show significant pulmonary edema. Has ruled out for an mi. Hemodynamically stable. Has pulmnonary hypertensoin wihich is likely at least partially due to sleeep apnea. Will need right heart cath to better document pulmonary pressures to assist pulmonologist in treating pulmonary hypertension more aggressively. Pt has mild diastollic heart failure nyha class 2-3.   Plan 1. Careful with diuresis as pt does not have signficant pulmonary edema. Will need to follow renal function 2. Low sodium diet 3. Weight loss 4. Right heart cath to evaluate pulmonary pressures. Risk and benefits discussed.  5. Continue current meds.   Electronic Signatures: Teodoro Spray (MD)  (Signed 20-Oct-14 17:01)  Authored: General Aspect/Present Illness, History and Physical Exam, Review of System, Health Issues, EKG , Allergies, Impression/Plan   Last Updated: 20-Oct-14 17:01 by Teodoro Spray (MD)

## 2015-04-04 NOTE — Discharge Summary (Signed)
PATIENT NAME:  Sara Pierce, LANNOM MR#:  [id number] DATE OF BIRTH:  05-Jan-1941  DATE OF ADMISSION:  10/11/2013 DATE OF DISCHARGE:  10/13/2013  DISCHARGE DIAGNOSES:  1.  Diarrhea, likely secondary to antibiotics, no Clostridium difficile.  2.  Dehydration.  3.  Acute renal failure.  4.  Chronic kidney disease, stage III.  5.  Chronic obstructive pulmonary disease.   IMAGING STUDIES:  Include chest, PA and lateral, which showed no acute cardiopulmonary disease. X-ray similar to prior chest x-ray.   CT abdomen and pelvis without contrast showed no acute abnormalities. No colitis.   ADMITTING HISTORY AND PHYSICAL:  Please see detailed H and P dictated previously by Dr. Laurin Coder. In brief, a 74 year old female patient who was recently treated in the hospital for COPD infection and discharged home on antibiotics presented to the hospital with hypertension recorded by home health nurse along with three episodes of diarrhea. The patient was admitted to hospitalist service secondary to concern for sepsis with C. diff.   HOSPITAL COURSE:  1.  Diarrhea. The patient did not have any diarrhea in the hospital. Initially started on metronidazole secondary to concern for C. diff. A C. difficile was sent, but no stool could be collected as the patient is constipated in the hospital. The patient's blood pressure improved with IV fluids. The white count trended down to a normal, which was elevated, likely secondary to hemoconcentration. The patient has been afebrile in the hospital, metronidazole was stopped. The patient feels better by day 2 with blood pressures in the normal range and her creatinine back to baseline and will be discharged back home. Although the patient did have risk factors for C. diff. Considering she improved without any treatment with stopping of diarrhea at the time of admission, I do not feel the patient had a C. diff. She likely had just antibiotic-induced diarrhea from side effects causing  dehydration. Also, the patient's Lasix has been reduced from 40 mg to 20 mg bid.  2.  The patient's COPD was stable in the hospital.  3.  Prior to discharge, the patient's abdominal examination shows no tenderness. Bowel sounds are present. No obvious hepatosplenomegaly palpable. No wheezing on examination of lungs.   DISCHARGE MEDICATIONS: Include:  1.  Mucinex 600 mg oral b.i.d.  2.  Lasix 20 mg daily.  3.  Bupropion 300 mg daily.  4.  Simvastatin 40 mg daily.  5.  Aldactone 25 mg daily.  6.  Advair Diskus 550 one puff inhaled twice a day.  7.  Spiriva 18 mcg daily.  8.  Quinapril 20 mg 1/2 tablet daily.  9.  Coreg 3.125 mg oral 2 times a day.  10.  Protonix 40 mg daily.  11.  ProAir HFA 2 puffs inhaled 4 times a day.  12.  Magnesium oxide 400 mg oral once a day.  13.  DuoNeb 3 mL inhaled 4 times a day as needed for shortness of breath.   DISCHARGE INSTRUCTIONS:  Include:  Continue home oxygen at 2 L, low-sodium diet. Activity as tolerated. Follow up with primary care physician in 1 to 2 weeks. The patient has been requested to return to the Emergency Room if she has any fever or recurrence of diarrhea.   TIME SPENT:  On day of discharge in discharge activity was 40 minutes.  ____________________________ Leia Alf Rhylan Kagel, MD srs:jm D: 10/13/2013 12:18:18 ET T: 10/13/2013 13:52:50 ET JOB#: [id number]  cc: Alveta Heimlich R. Tipton Ballow, MD, <Dictator> Neita Carp MD ELECTRONICALLY SIGNED 10/22/2013 15:00

## 2015-04-04 NOTE — Discharge Summary (Signed)
PATIENT NAME:  Sara Pierce, Sara Pierce MR#:  [id number] DATE OF BIRTH:  1941/01/15  DATE OF ADMISSION:  09/30/2013 DATE OF DISCHARGE:  10/04/2013  DISCHARGE DIAGNOSES:  1. Chronic obstructive pulmonary disease exacerbation.  2. Acute respiratory failure.  3. 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  4. Sleep apnea.  5. Hypertension.  6. Obesity.  CODE STATUS: Full code.   MEDICATION ON DISCHARGE:  1. Bupropion 300 mg 24-hour extended release tablet.  2. Simvastatin 40 mg oral tablet once a day.  3. Spironolactone 25 mg oral tablet once a day.  4. Clobetasone topical cream to affected area 2 times a day.  5. Furosemide 40 mg oral tablet once a day.  6. Spiriva 18 mcg inhalation capsule once a day.  7. Ammonium lactate topical cream 2 times a day.  8. Prednisone 10 mg oral tablet, start at 60 mg and taper by 10 mg daily until complete.  9. Quinapril 20 mg oral tablet take 1/2-tablet once a day.  10. Carvedilol 3.125 mg oral tablet 2 times a day.  11. Ceftin 500 mg oral tablet 2 times a day for 3 days.  12. Fluticasone nasal spray 2 sprays once a day.  13. Pantoprazole 40 mg delayed-release 2 times a day.  14. Guaifenesin and pseudoephedrine 600 mg/60 mg tablet every 12 hours.   HOME HEALTH ON DISCHARGE: Yes.  HOME HEALTH SERVICES: Physical therapy and nurses.   HOME OXYGEN ON DISCHARGE: Yes, oxygen 2 liters nasal cannula supplementation.  DIET: Regular consistency of diet, low sodium, low fat and low cholesterol.  FOLLOWUP:  1. Advised to follow within 1 to 2 weeks with Dr. Raul Del.  2. Advised to have sleep study as outpatient.   HISTORY OF PRESENTING ILLNESS: A 74 year old female with history of COPD, chronic dermatitis and congestive heart failure, has been extensively evaluated by Dr. Nehemiah Massed of cardiology and Dr. Raul Del in pulmonary for her shortness of breath, who presented to the Emergency Room for 2- to 3-day history of worsening shortness of breath.  She was found to be hypoxic, in moderate respiratory distress in the Emergency Room. Cardiac enzymes were negative, and admitted for further evaluation.   HOSPITAL COURSE AND STAY:  1. Acute respiratory failure, oxygen supplementation. She was needing oxygen 2 liters, and even after treatment, she was still dropping her oxygen saturation on exertion, and so we discharged her with oxygen supplementation at home. We treated her underlying disease like chronic obstructive pulmonary disease exacerbation with IV steroid, inhaled steroid and inhalers and bronchodilators. Dr. Raul Del was following her, and he also advised sleep study as outpatient. 2. Acute bronchitis. She had complaint of cough, but no sputum production, and x-ray findings were not suggestive of any pneumonia. We gave Rocephin antibiotic and discharged with oral. 3. Chronic diastolic congestive heart failure. This was right-sided elevated pressure as per Dr. Gust Brooms echocardiogram as outpatient. Cardiology consult was called in, and they did suggest right-sided catheterization, suggested pulmonary management for pulmonary hypertension, and spironolactone and Lasix were started. There were no symptoms of acute exacerbation later on, and so pulmonary suggested to do a sleep study as outpatient after discharge.  4. Other medical issues, including anxiety and hyperlipidemia, have remained stable in the hospital.   [place]: Dr. Raul Del for pulmonology, Dr. Bartholome Bill for cardiology.  IMPORTANT LABORATORY RESULTS: Urinalysis was grossly negative on admission. WBC count was 12.6, hemoglobin was 12.5, platelet was 218. Chest x-ray, portable: No acute cardiopulmonary disease. BNP was 1278. Creatinine was  1.51.   TOTAL TIME SPENT ON THIS DISCHARGE: 40 minutes.    ____________________________ Ceasar Lund Anselm Jungling, MD vgv:lb D: 10/08/2013 23:52:35 ET T: 10/09/2013 07:22:47 ET JOB#: [id number]  cc: Ceasar Lund.  Anselm Jungling, MD, <Dictator> Herbon E. Raul Del, MD Corey Skains, MD  Rosalio Macadamia [facility] MD ELECTRONICALLY SIGNED 10/10/2013 14:01

## 2015-04-05 NOTE — H&P (Signed)
PATIENT NAME:  Sara Pierce, Sara Pierce MR#:  [id number] DATE OF BIRTH:  [birth year]  DATE OF ADMISSION:  04/11/2014  PRIMARY CARE PHYSICIAN: [facility]  PRIMARY CARDIOLOGIST: Dr. Nehemiah Massed  CHIEF COMPLAINT: Referred by PCPs office for elevated BUN/creatinine and potassium.   REFERRING PHYSICIAN: Dr. Lenise Arena  HISTORY OF PRESENT ILLNESS: The patient is a pleasant, 74 year old, obese female with a history of chronic diastolic CHF, pulmonary hypertension, COPD, and multiple admissions here in the past for various reasons. She was here in March for shortness of breath and  CHF that appears to be somewhat better, and now she is able to lie flat. However, a couple of weeks ago, as she still had persistent lower extremity edema without worsening shortness of breath, her spironolactone dose was increased from 50 to 100 mg. For the past week, the patient has felt weak, and feels unsteady on her legs. She had some lab work at her PCP's office, who noted her to have acute chronic renal failure. Her creatinine apparently is in stage III at about 1.5 range, and creatinine was 3.78 with potassium of 6.2 yesterday, and she was referred to come here for further evaluation. Here, she was noted to have a little bit of urinary retention, about 600 mL, and a Foley was placed. Renal ultrasound was done to evaluate for hydronephrosis, and that is negative.   PAST MEDICAL HISTORY: Hypertension, hyperlipidemia, chronic diastolic CHF, obstructive sleep apnea on CPAP, COPD, chronic dermatitis, chronic venous insufficiency of lower extremities, mild depression.   ALLERGIES: ASPIRIN, CODEINE, and TAPE.   SOCIAL HISTORY: A heavy smoker, quit about 8 years ago. No alcohol or drug use.   FAMILY HISTORY: Diabetes and stroke.   SURGICAL HISTORY: Appendectomy, cardiac cath, and lens replacement.   OUTPATIENT MEDICATIONS: Aldactone recently changed from 50 to 100 mg daily, quinapril 20 mg daily, simvastatin 40 mg daily,  Coreg 6.25 mg 2 times a day, Advair 500/550, 1 puff 2 times a day, DuoNeb 4 times a day as needed, ProAir p.r.n., Spiriva 18 mcg inhaled daily, Lasix 20 mg daily, clonazepam 0.5 mg 2 times a day, magnesium oxide 400 mg daily, bupropion XL 300 mg daily.   REVIEW OF SYSTEMS:   CONSTITUTIONAL: Positive for weakness for a day. No weight loss or weight gain. No fevers or chills.  EYES: No blurry vision or double vision.  ENT: No tinnitus or hearing loss.  RESPIRATORY: Denies cough or shortness of breath. Feels like she is at baseline. Can lie flat without significant shortness of breath. Has persistent lower extremity edema.  CARDIOVASCULAR: No chest pain. No palpitations. Has chronic diastolic CHF.   GASTROINTESTINAL: No nausea, vomiting, diarrhea, abdominal pain. No blood or tarry stools.  GENITOURINARY: Denies dysuria. Feels like she is peeing okay. She peed 2 times yesterday, once overnight and a good one this morning. HEMATOLOGIC AND LYMPHATIC: No easy bruising.  SKIN: No rashes.  MUSCULOSKELETAL: Denies arthritis or gout.  NEUROLOGIC: No focal weakness or numbness.  PSYCHIATRIC: Has some depression.   PHYSICAL EXAMINATION: VITAL SIGNS: Temperature on arrival 98.1, pulse rate 69, respiratory rate 20, blood pressure 130/93, O2 sat 94% on oxygen, 96% on room air.  GENERAL: The patient is an obese, well-developed, pleasant female lying in bed, no obvious distress.  HEENT: Normocephalic, atraumatic. Pupils are equal and reactive. Anicteric sclerae. Moist mucous membranes.  NECK: Supple. No thyroid tenderness. No cervical lymphadenopathy.  CARDIOVASCULAR: S1, S2. Regular. No significant murmurs.  LUNGS: Clear to auscultation mostly, with minimum basilar crackles.  Good air entry. No wheezing or rhonchi.  ABDOMEN: Soft, nontender. Positive bowel sounds in all quadrants.  EXTREMITIES: There is 1+ pitting edema lower extremities below shin, shows some erythema without warmth or oozing, and some  varicosities.  NEUROLOGIC: Cranial nerves II through XII grossly intact. Strength is 5/5 all extremities. Sensation is intact to light touch.  PSYCHIATRIC: Awake, alert and oriented x 3.  LABS AND IMAGING:  Glucose 110, BUN 44, creatinine 2.94, sodium 131, potassium 5.4. Troponin 0.8. LFTs within normal limits. White count 9.9, hemoglobin 12, platelets 250. UA does not suggest an infection. EKG is normal sinus rhythm with a right bundle branch block, rate is 70, no acute ST elevations or depressions, some T-wave inversions in V1 and V2, some flattening in V3, which are not new. Ultrasound of the kidneys as above.   ASSESSMENT AND PLAN: We have a pleasant, 74 year old with multiple comorbidities, including hypertension, history of chronic diastolic congestive heart failure, chronic obstructive pulmonary disease, pulmonary hypertension, who is referred here for acute on chronic renal failure with hyperkalemia, which I suspect is most likely secondary to increased dose of spironolactone, with possibility of acute urinary retention contributing to a lesser degree. She does have worsening renal failure, with creatinine of 1.5 at baseline, a range to about 2.94 today. The potassium has come down nicely from yesterday to today. However, it is still 5.4. I would go ahead and give a dose of Kayexalate. I would hold Aldactone, ACE inhibitor, and Lasix, as at this point, I do not think that she is grossly volume overloaded in acute diastolic CHF. I would obtain a Renal consult. The patient does not appear to have any significant hydronephrosis, and she already has a Foley in for decompression of the bladder. I would start her on very gentle fluids for 1 liter only secondary to her history of CHF. The patient has a positive troponin, but has no chest pain. This is likely secondary to worsening renal failure. Would cycle the troponins. Would continue the beta blocker, continue her COPD medications, start her on heparin for  deep vein thrombosis prophylaxis, continue her depression medications.   The patient is a FULL CODE.   Total time spent is 50 minutes.     ____________________________ Vivien Presto, MD sa:mr D: 04/11/2014 19:00:32 ET T: 04/11/2014 19:45:26 ET JOB#: [id number]  cc: Vivien Presto, MD, <Dictator> Corey Skains, MD Valera Castle, MD   Vivien Presto MD ELECTRONICALLY SIGNED 05/07/2014 15:54

## 2015-04-05 NOTE — Discharge Summary (Signed)
PATIENT NAME:  Sara Pierce, DEFORD MR#:  [id number] DATE OF BIRTH:  1941-08-07  DATE OF ADMISSION:  01/10/2014 DATE OF DISCHARGE:  01/12/2014  For a detailed note, please take a look at the history and physical on admission by Dr. Waldron Labs.  DIAGNOSES AT DISCHARGE: As follows:  1.  Acute on chronic respiratory failure secondary to chronic obstructive pulmonary disease exacerbation. 2.  Chronic obstructive pulmonary disease exacerbation.  3.  Congestive heart failure, likely acute on chronic diastolic dysfunction.  4.  Hypertension.  5.  Hyperlipidemia.  6.  Depression.  The patient is being discharged on a low-sodium diet.   ACTIVITY: As tolerated.  Follow up with [facility] in [place].   DISCHARGE MEDICATIONS: Wellbutrin 300 mg daily, simvastatin 40 mg at bedtime, Aldactone 25 mg daily, clobetasol topical appointment to be applied b.i.d., Advair 500/50 one puff b.i.d., Spiriva 1 puff daily, ammonium lactate to be applied b.i.d., quinapril 10 mg daily, Coreg 3.125 mg b.i.d., Protonix 40 mg b.i.d., nystatin cream to be applied t.i.d., albuterol inhaler 2 puffs 4 times daily as needed, magnesium oxide 400 mg daily, Lasix 40 mg 1/2 tab daily, DuoNeb 4 times daily as needed, prednisone taper starting at 60 mg down to 10 mg in the next 6 days and Levaquin 500 mg daily x 5 days.   PERTINENT STUDIES DONE DURING THE HOSPITAL COURSE: Are as follows: A chest x-ray done on admission showing no acute cardiopulmonary disease.   HOSPITAL COURSE: This is a 74 year old female with medical problems as mentioned above, presented to the hospital with shortness of breath and acute on chronic respiratory failure secondary to COPD exacerbation.   PROBLEM: 1.  Acute on chronic respiratory failure. This was secondary to chronic obstructive pulmonary disease exacerbation. The patient was admitted to the hospital, started on IV steroids, around-the-clock nebulizer treatments, maintained on Advair, Spiriva and  empiric IV antibiotics were added. The patient after getting aggressive therapy has significantly improved. She has less bronchospasm and wheezing. She has been afebrile and hemodynamically stable over the past 24 hours. I am presently discharging her on oral prednisone taper and empiric Levaquin as stated. Chest x-ray on admission did not show any evidence of pneumonia.  2.  Chronic obstructive pulmonary disease exacerbation. This was likely secondary to possible underlying bronchitis. The patient was treated aggressively with IV steroids, around-the-clock nebulizer treatments, maintained on Advair and Spiriva, has clinically significantly improved since admission and is currently being discharged on oral prednisone taper on empiric Levaquin as stated.  3.  Chest pain. This was likely secondary to the chronic obstructive pulmonary disease and her cough and likely musculoskeletal in nature. It has since then improved and resolved. 4.  Hypotension. This was likely secondary to her bronchitis and respiratory failure. After getting some IV fluids and antihypertensives being held, it has since then improved and resolved.  5.  Hyperlipidemia. The patient was maintained on her simvastatin. She will resume that. 6.  History of congestive heart failure. Clinically, the patient did not have any evidence of congestive heart failure while in the hospital. She will continue her Aldactone, Lasix, beta blocker and ACE inhibitor upon discharge.  7.  Depression. The patient was maintained on her Wellbutrin and she will also resume that upon discharge.  The patient is being discharged home with home oxygen, as she is already on it.  TIME SPENT AT DISCHARGE:  40 minutes   ____________________________ Belia Heman. Verdell Carmine, MD vjs:ce D: 01/12/2014 15:29:34 ET T: 01/12/2014 19:35:50 ET JOB#: [id number]  cc: Belia Heman. Verdell Carmine, MD, <Dictator> Henreitta Leber MD ELECTRONICALLY SIGNED 01/17/2014 11:49

## 2015-04-05 NOTE — H&P (Signed)
PATIENT NAME:  Sara Pierce, Sara Pierce MR#:  [id number] DATE OF BIRTH:  [birth year]  DATE OF ADMISSION:  02/08/2014  PRIMARY DOCTOR:  Dr. Kym Groom.   EMERGENCY ROOM PHYSICIAN:  Dr. Corky Downs.   CHIEF COMPLAINT:  Shortness of breath.   HISTORY OF PRESENT ILLNESS:  A 74 year old female patient comes in with shortness of breath, cough and pedal edema.  The patient was discharged on February 3rd after she had COPD exacerbation.  According to her, she still has trouble breathing, cough, unable to get the phlegm out, orthopnea and PND.  Denies any chest pain and the patient says that she had a fall and fractured her nose.  She was seen in the Emergency Room on 21st and 22nd.  At that time, she was given Percocet for pain relief and the patient sent home.  Since then, the patient complains of this bilateral rib pain, trouble breathing, cough, wheezing, trouble breathing as I mentioned and no fever.  Pedal edema is there, but left leg is more swollen and red than the right.    PAST MEDICAL HISTORY:  Significant for history of admission in February, discharged home with home health.  She was discharged on February 3rd.  She has a history of CHF, COPD on home oxygen 2 liters, hypertension, hyperlipidemia, anxiety.  The patient's past medical history also includes chronic venous stasis of the legs, mild depression, pulmonary hypertension.  SOCIAL HISTORY:  Previous smoker, now quit.  No alcohol.  No drugs.  The patient lives with her husband.  ALLERGIES:  ALLERGIC TO ASPIRIN, CODEINE AND TAPE.    FAMILY HISTORY:  Significant for diabetes and stroke.   PAST SURGICAL HISTORY:  Appendectomy and cardiac cath.   REVIEW OF SYSTEMS:  CONSTITUTIONAL:  No fever.  Does have fatigue and weakness.  EYES:  No blurred vision.  EARS, NOSE, THROAT:  No tinnitus, no ear pain, no difficulty swallowing.  RESPIRATORY:  The patient does have cough, wheezing, COPD.  CARDIOVASCULAR:  The patient denies any chest pain.  Does have orthopnea,  PND and pedal edema.  GENITOURINARY:  No dysuria.  GASTROINTESTINAL:  Does have nausea.  No vomiting.  No abdominal pain.  ENDOCRINE:  No polyuria or nocturia.  HEMATOLOGIC:  No anemia or easy bruising.  INTEGUMENTARY:  No skin rashes.  MUSCULOSKELETAL:  Does have bilateral rib pains.  NEUROLOGIC:  No numbness or weakness.  PSYCHIATRIC:  No anxiety or insomnia.   PHYSICAL EXAMINATION: VITAL SIGNS:  Temperature 97.6, heart rate 83, blood pressure is 90/80.  The patient's initial blood pressure was 90/80 and O2 sats were 91% on room air.   GENERAL:  This is a well-developed, well-nourished female not in distress, answering questions appropriately.  HEENT:  Head normocephalic, atraumatic.  EYES:  Pupils reacting to light.  No conjunctival pallor.  No scleral icterus.  NOSE:  The patient does have some swelling around the bridge of the nose.  No redness.  MOUTH:  No lesions.  No exudates.  NECK:  Supple.  No JVD.  No carotid bruit.  Normal range of motion.  RESPIRATORY:  Has bilateral expiratory wheeze in all lung fields and also crackles in right base.  CARDIOVASCULAR:  S1, S2 regular.  No murmurs.  No gallops.  GASTROINTESTINAL:  Abdomen is soft, nontender, nondistended.  Bowel sounds present.  MUSCULOSKELETAL:  The patient has bilateral extremities swelling up to the legs.  SKIN:  Inspection is normal except the left leg swollen and red.  LYMPHATICS:  No lymphadenopathy.  NEUROLOGIC:  Cranial nerves II through XII intact.  Power 5 by 5 in upper and lower extremities.  Sensation intact.  DTRs 2+ bilaterally.  PSYCHIATRIC:  Mood and affect are within normal limits.   LABORATORY DATA:  WBC 8.3, hemoglobin 11.2, hematocrit 33.1, platelets 263.  Troponin less than 0.02.  Electrolytes, sodium 132, potassium 4.3, chloride 96, bicarb 32, BUN 36, creatinine 1.46.  Glucose 103.  BNP is 448.  Chest x-ray shows no active cardiopulmonary disease.  Has cardiomegaly.  The patient's CT head was done on  February 21st, did not show any acute changes.  The patient also had a CT of maxillary area without contrast which showed slightly deviated nasal septum with mild sinusitis, mild depressed right nasal bone fracture, nondisplaced left nasal bone fracture and also right nasal bone fracture.  The patient's rib x-rays showed no fracture in the ribs and had cardiomegaly.  EKG shows sinus rhythm with PVCs, 80 beats per minute.  T wave inversions in V4, V5 and V6.   ASSESSMENT AND PLAN:   1.  This patient is a 74 year old female with shortness of breath with pedal edema and cough.  She has a combination picture of chronic obstructive pulmonary disease and congestive heart failure exacerbation.  The patient will be admitted to hospitalist service on telemetry.  Continue IV Solu-Medrol along with oxygen, Rocephin, Zithromax and DuoNebs.  2.  For congestive heart failure, she is going to be on IV Lasix at 40 q. 12 hours and check daily weights.  The patient has acute on chronic diastolic heart failure.  She is already on Coreg, simvastatin.  Continue them.  3.  History of chronic obstructive pulmonary disease.  As I mentioned, she is already on steroids, nebulizers.  We will resume the Spiriva.  4.  Recent fall with rib pains and left leg cellulitis.  The patient says she is better with Percocet.  We will continue Percocet 5/325 q. 6 hours as needed.  5.  Gastrointestinal prophylaxis and deep vein thrombosis prophylaxis.   TIME SPENT:  About 55 minutes on history and physical.    ____________________________ Epifanio Lesches, MD sk:ea D: 02/08/2014 15:07:49 ET T: 02/08/2014 15:42:39 ET JOB#: [id number]  cc: Epifanio Lesches, MD, <Dictator> Epifanio Lesches MD ELECTRONICALLY SIGNED 02/25/2014 12:41

## 2015-04-05 NOTE — Discharge Summary (Signed)
PATIENT NAME:  Sara Pierce, Sara Pierce MR#:  [id number] DATE OF BIRTH:  Apr 27, 1941  DATE OF ADMISSION:  10/05/2014 DATE OF DISCHARGE:  10/07/2014  DISCHARGE DIAGNOSES: 1.  Left lower lobe pneumonia.  2.  Chronic obstructive pulmonary disease exacerbation. 3.  Chronic respiratory failure.  4.  Chronic kidney disease stage III.  5.  Hypertension.  6.  Anxiety.  7.  Gastroesophageal reflux disease.   DISCHARGE MEDICATIONS: 1.  Wellbutrin 300 mg orally once a day.  2.  Advair Diskus 500/50 one puff inhaled 2 times a day.  3.  Spiriva 18 mcg inhaled once a day.  4.  Coreg 3.125 mg 2 times a day.  5.  Magnesium oxide 400 mg daily.  6.  Simvastatin 40 mg daily.  7.  Fluoxetine 20 mg 2 capsules daily.  8.  Lorazepam 0.5 mg orally 2 times a day as needed for anxiety.  9.  Clotrimazole topically 2 times a day.  10.  Nexium 40 mg daily.  11.  Quinapril 5 mg orally once a day. 12.  Spironolactone 100 mg daily.  13.  Theophylline 1 tablet orally 2 times a day 100 mg.  14.  Lasix 40 mg orally once a day.  15.  DuoNeb nebulizer 4 times a day as needed for shortness of breath.  16.  Levaquin 500 mg orally once a day for 5 days.  17.  Prednisone 60 mg tapered over 6 days.   DISCHARGE INSTRUCTIONS:  Home health with physical therapy and nursing. Home oxygen 2 liters continuous. Low-sodium, low-fat diet. Follow up with primary care physician in 1 to 2 weeks.   IMAGING STUDIES DONE INCLUDE:  1.  Chest x-ray PA and lateral, which showed COPD and worsening left lower lobe pneumonia.  2.  Hip x-rays showed no fractures or dislocations.  3.  Lumbar spine x-ray showed osteopenia and degenerative changes.   ADMITTING HISTORY AND PHYSICAL:  Please see the detailed H and P dictated by Dr. Vianne Bulls. In brief, a 74 year old female patient with history of COPD and diastolic congestive heart failure on 2 liters home oxygen, who presented to the hospital complaining of some trouble breathing and coughing. The patient  also had some hip pain. She had x-rays done, which showed left lower lobe pneumonia with some mild COPD exacerbation and was admitted to the hospitalist service.   HOSPITAL COURSE: 1.  Left lower lobe pneumonia with chronic obstructive pulmonary disease exacerbation. The patient continued to be on 2 liters of oxygen and was started on IV antibiotics with which she improved well. Her breathing returned to baseline quickly being on IV steroids, nebulizers, and antibiotics. The patient felt back to baseline by day of discharge. No wheezing on examination. She has ambulated well in the hallway with physical therapy and recommended home health, which has been set up. The patient is being discharged home on a prednisone taper. Continue home inhalers with DuoNeb and 5 more days of antibiotic.  2.  The patient's hip pain, which is chronic on and off, has resolved with pain medications. No fractures or dislocations found on x-ray.  3.  The patient's congestive heart failure and hypertension remained stable.   Prior to discharge, the patient has no wheezing, heart sounds S1 and S2 without any murmurs, and is alert and oriented x 3.   TIME SPENT ON DAY OF DISCHARGE IN DISCHARGE ACTIVITY:  35 minutes.     ____________________________ Leia Alf Akilah Cureton, MD srs:nb D: 10/10/2014 14:53:55 ET T: 10/11/2014 00:07:42 ET  JOB#: [id number]  cc: Velera Lansdale R. Lori Liew, MD, <Dictator> Valera Castle, MD  Neita Carp MD ELECTRONICALLY SIGNED 10/17/2014 14:56

## 2015-04-05 NOTE — H&P (Signed)
PATIENT NAME:  Sara Pierce, Sara Pierce MR#:  [id number] DATE OF BIRTH:  14-Jul-1941  DATE OF ADMISSION:  10/05/2014  PRIMARY CARE PHYSICIAN: Dr. Johny Drilling   CHIEF COMPLAINT: Trouble breathing and cough.  HISTORY OF PRESENT ILLNESS: The patient is a 74 year old female patient with history of COPD, diastolic congestive heart failure,  came with sob..  The patient having symptoms for over 2 weeks associated with cough and phlegm. The patient was taken to the Emergency Room on the 15th of this month and was given Levaquin for bronchitis. The patient took 5 days of Levaquin but because of persistent trouble breathing along with fever, cough, and some hemoptysis, the patient came to the Emergency Room. In the ER, the patient was found to have normal vitals and O2 saturation was 94% on room air and x-ray showed left upper lobe pneumonia . The patient also was given steroids, but she already finished a course of steroids. At home, the patient said that she had some blood in the sputum initially but that is resolved now. Her main complaint is cough and trouble breathing.   PAST MEDICAL HISTORY: Significant for history of oxygen-dependent COPD, diastolic congestive heart failure with ejection fraction of 50% on last echocardiogram, history of hyperlipidemia, . 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   PAST SURGICAL HISTORY: Significant for appendectomy and bilateral lens implantation in both eyes.   ALLERGIES: ASPIRIN, CODEINE, AND TAPE.   SOCIAL HISTORY: Previous smoker, now quit. Lives with her husband.  No drinking.   FAMILY HISTORY: Both parents and brother have heart disease and CHF.   REVIEW OF SYSTEMS:  CONSTITUTIONAL: c/o fever sensation EYES: No blurred vision. ENT: No tinnitus. No ear pain. No epistaxis. No difficulty swallowing.  RESPIRATIONS: Does have some cough with breathing and some hemoptysis.  CARDIOVASCULAR: No chest pain. No orthopnea or PND. No pedal edema.  GASTROINTESTINAL:  Denies nausea or vomiting.  GENITOURINARY: No dysuria or hematuria.  HEMATOLOGICAL: No anemia or easy bruising. SKIN: No skin rashes.  MUSCULOSKELETAL: No joint pain.  NEUROLOGIC: No history of strokes or TIA.  PSYCHIATRIC: No anxiety or insomnia. She does have a history of depression.   HOME MEDICATIONS INCLUDE:  1. Advair Diskus 500/50 one puff b.i.d.  2. Bupropion XR 300 mg daily.  3. Coreg 3.125 mg p.o. b.i.d.  4. Clonazepam 0.5 mg p.o. b.i.d.  5. Clotrimazole topically daily.  6. DuoNeb q. 6 hours p.r.n.  7. Fluoxetine 20 mg 2 capsules daily.  8. Furosemide 40 mg p.o. daily.  9. Magnesium oxide 400 mg p.o. daily.  10. Nexium 40 mg p.o. daily.  11. Spironolactone 100 mg p.o. daily.  12. Quinapril 5 mg p.o. daily. 13. Simvastatin 40 mg p.o. daily.  14. Spiriva 18 mcg inhalation capsule 18 mcg inhalation daily.   15. Theophylline 100 mg p.o. 1 tablet b.i.d.   PHYSICAL EXAMINATION: VITAL SIGNS: Temperature 97.8, heart rate 76, blood pressure 116/40, and the patient's O2 saturation is 94% on 2 liters. GENERAL: She is an alert, awake, oriented, elderly female, not in distress, answering questions appropriately.   HEAD: Atraumatic, normocephalic. EYES: Pupils equal, reacting to light. Extraocular movement is intact. NOSE: No nasal lesions. No drainage.  EARS: No drainage or external lesions. NECK: Supple. No JVD. No carotid bruit.   CARDIOVASCULAR: S1, S2 regular. No murmurs.  LUNGS: Bilateral expiratory wheezing in all lung fields; not using accessory muscles of respiration.  GASTROINTESTINAL: Abdomen is soft, nontender, nondistended. Bowel sounds present.  EXTREMITIES: No cyanosis,  no clubbing.  NEUROLOGIC: The patient is alert, awake, oriented. No focal neurological deficits. Cranial nerves II through XII intact. Power 5/5 in upper and lower extremities. Sensation intact. Reflexes 2+ bilaterally. PSYCHIATRIC: Mood and affect are within normal limits.  LABORATORY DATA:  Electrolytes: Sodium is 137, potassium 4.7, chloride 90, bicarbonate 33, BUN 25, creatinine 1.2, and glucose 117. LFTs within normal limits.   WBC 14.7, hemoglobin 12.8, hematocrit 39.6, platelets 264,000, neutrophils 11.8. BNP slightly up at 306.   Chest x-ray showing left lower lobe atelectasis and pneumonia which is worsening. The patient had hip x-rays done because she complained of hip pain and had a fall a few days ago. The patient's hip x-ray did not show any fraction on the left hip. The patient also had an x-ray of the lumbar spine which showed osteopenia, no acute changes.   ASSESSMENT AND PLAN:  1. This is a 74 year old female with pneumonia, failed outpatient therapy. Start the patient on vancomycin and Zosyn. Continue oxygen. Start Solu-Medrol and DuoNebs and she how does.  2. Chronic obstructive pulmonary disease;not in flare up now., continue Solu-Medrol along with theophylline, Spiriva, and DuoNebs.  3. Hypertension with chronic systolic heart failure. The patient is not in congestive heart failure. Continue her home medications.  4. Discussed the plan with the patient's husband and we are going to admit her to telemetry.   TIME SPENT: More than 55 minutes.  ____________________________ Epifanio Lesches, MD sk:ts D: 10/05/2014 22:53:00 ET T: 10/06/2014 00:10:46 ET JOB#: [id number] Epifanio Lesches MD ELECTRONICALLY SIGNED 11/11/2014 11:21

## 2015-04-05 NOTE — Discharge Summary (Signed)
PATIENT NAME:  Sara Pierce, Sara Pierce MR#:  [id number] DATE OF BIRTH:  09/12/1941  DATE OF ADMISSION:  04/11/2014  DATE OF DISCHARGE:  04/13/2014  ADMITTING DIAGNOSIS: Acute renal failure.   DISCHARGE DIAGNOSES: 1.  Acute on chronic renal failure. 2.  Dehydration due to diuretics, resolved with IV fluids. 3.  Hypernatremia, hyperkalemia, resolved. Due to dehydration as well as acute renal failure.  4.  Cough, with no obvious pneumonia.  5.  Obesity.  6.  Generalized weakness. 7.  History of COPD, stable. 8.  Diastolic CHF, stable.  9.  History of hypertension and CKD stage III.    DISCHARGE CONDITION: Stable.   DISCHARGE MEDICATIONS: The patient is to continue bupropion 200 mg p.o. once daily, Advair Diskus 100/50, 1 puff twice daily, Spiriva 1 capsule inhaler daily, carvedilol 3.125 mg twice daily, ProAir HFA 2 puffs 4 times daily, magnesium oxide 400 mg p.o. daily, simvastatin 40 mg p.o. at bedtime, fluoxetine 200 mg p.o. 2 capsules once daily, clonazepam 0.5 mg twice daily as needed, benzonatate capsules 100 mg every 6 hours as needed, Nystatin 100 units/gram of topical powder twice daily as needed. Guaifenesin 600 mg p.o. twice daily, HCTZ 25 mg p.o. daily, this is a new medication. The patient is not to take Lasix, quinapril, or spironolactone at this time, unless recommended by primary physician. Home health. Physical therapy oxygen at 2 liters of oxygen through nasal cannula. Diet: 2 gram salt, low heart, low cholesterol, regular consistency.   INSTRUCTIONS:  Activity limitations as tolerated. Referral to home health physical therapy.    Followup appointment with Dr. Kym Groom in 2 days after discharge, Dr. Candiss Norse as well as Dr. Holley Raring in 2 days after discharge.    CONSULTANTS: Dr. Holley Raring as well as Dr. Candiss Norse.    RADIOLOGIC STUDIES: Ultrasound of kidneys bilateral 04/11/2014 revealed normal exam. Chest x-ray PA and lateral 04/12/2014 showed cardiomegaly and basilar atelectasis without definite  acute cardiopulmonary disease.   HISTORY OF PRESENT ILLNESS: The patient is a 74 year old female with past medical history significant for history of recent admission for congestive heart failure, who was discharged on high doses of diuretics. Presents to the hospital with complaints of weakness and unsteadiness on the feet. She was seen by her primary physician, who noted her to be in acute on chronic renal failure, with creatinine level of around 3, as opposed to her baseline 1.5. Her potassium was also found to be high, and she was brought to the Emergency Room for further evaluation. She was noted to have some urinary retention duringh bladder scanning,  600 mL, and Foley catheter was placed. Renal ultrasound did not show any hydronephrosis.   The patient was admitted to the hospital for further evaluation. Her diuretics as well as ACE inhibitor was stopped, and she received some gentle hydration. With regards to acute on chronic renal failure, it appears that the patient's acute renal failure was related to some element of dehydration likely as well as ACE inhibitor use. For this reason, the patient's furosemide as well as quinapril as well as spironolactone were placed on hold. The patient's kidney function normalized with mild hydration in the hospital, as well as holding those medications. As mentioned above, her kidney function was mildly abnormal. On admission to the hospital, her creatinine was 2.94, with potassium level of 5.4. Her creatinine, however, normalized to 1.25 on 04/13/2014, and that potassium level was 4.6. The patient's medications such as spironolactone as well as ACE inhibitor, quinapril were responsible for hyperkalemia, and  those were stopped for now for the time being.  In regards to dehydration, again patient's diuretics were placed on hold. The patient was noted to be hyponatremic, with a sodium level of 131. She was rehydrated, and her sodium level normalized to 137 on the day  of discharge. Her potassium level, as mentioned above, also normalized to 4.6 after ACE inhibitor or spironolactone was stopped. In regards to cough, it was felt to be COPD-related, since no obvious pneumonia was noted and no worsening CHF. The patient was continued on cough medications as needed. For generalized weakness, she was evaluated by physical therapist, who recommended home health physical therapy. The patient was seen for her history of congestive heart failure by kidney specialist, Dr. Holley Raring as well as Dr. Candiss Norse, and felt that patient would benefit from HCTZ use for now. The patient was initiated on 25 mg of HCTZ. She is to follow up with her primary care physician as well as Dr. Candiss Norse and Dr. Holley Raring for management of her chronic renal insufficiency as well as CHF related to diastolic congestive heart failure and renal failure.   The patient is being discharged in stable condition with the above-mentioned medications and followup. On the day of discharge, temperature was 97.8, pulse was 64 to 70, respiration rate was 20, blood pressure 122, ranging from 858 to 850 systolic and 27X to 41O diastolic, O2 sat was 87% on room air at rest.   Time spent: 40 minutes.   ____________________________ Theodoro Grist, MD rv:mr D: 04/13/2014 14:29:00 ET T: 04/13/2014 21:55:45 ET JOB#: [id number]  cc: Valera Castle, MD [place] Lilian Kapur, MD Theodoro Grist, MD, <Dictator>    [place] MD ELECTRONICALLY SIGNED 05/02/2014 10:04

## 2015-04-05 NOTE — Consult Note (Signed)
PATIENT NAME:  Sara Pierce, VANCAMP MR#:  [id number] DATE OF BIRTH:  [birth year]  CARDIOLOGY CONSULTATION   DATE OF CONSULTATION:  05/11/2014  REFERRING PHYSICIAN:   CONSULTING PHYSICIAN:  Isaias Cowman, MD  PRIMARY CARE PHYSICIAN: Valera Castle, MD   CARDIOLOGIST: Corey Skains, MD  CHIEF COMPLAINT: Shortness of breath.   HISTORY OF PRESENT ILLNESS: The patient is a 74 year old female with history of chronic diastolic congestive heart failure, COPD, on home oxygen, chronic kidney disease and obstructive sleep apnea. The patient has had a history of recurrent episodes of acute on chronic diastolic congestive heart failure. She has a 3- to 4-day history of increasing peripheral edema and shortness of breath. The patient was seen at [facility] Emergency Room on 05/09/2014, treated with intravenous furosemide, with diuresis. The patient was discharged home; however home health nurse noted that she had difficulty breathing and returned to [facility] Emergency Room. Chest x-ray revealed pulmonary vascular congestion. Admission labs were notable for a negative troponin.   PAST MEDICAL HISTORY:  1. Chronic diastolic congestive heart failure with known LVEF of 50%.  2. Hypertension.  3. Hyperlipidemia.  4. Obstructive sleep apnea.  5. Obesity.  6. Chronic venous insufficiency.  7. Depression.   MEDICATIONS:  1. Carvedilol 3.125 mg b.i.d. 2. Simvastatin 40 mg daily.  3. Hydrochlorothiazide 25 mg daily.  4. Bupropion 300 mg daily.  5. Advair 500/50 one puff b.i.d. 6. Spiriva inhaler 1 puff daily. 7. ProAir inhaler 2 puffs q.i.d. 8. Magnesium oxide 400 mg daily.  9. Fluoxetine 40 mg daily.  10. Klonopin 0.5 mg b.i.d. p.r.n.   SOCIAL HISTORY: The patient denies tobacco abuse.   FAMILY HISTORY: Positive for coronary artery disease and congestive heart failure.   REVIEW OF SYSTEMS:  CONSTITUTIONAL: No fever or chills.  EYES: No blurry vision.  EARS: No hearing loss.  RESPIRATORY: The  patient has had shortness of breath and nonproductive cough.  CARDIOVASCULAR: The patient denies chest pain.  GASTROINTESTINAL: No nausea, vomiting or diarrhea.  GENITOURINARY: No dysuria or hematuria.  ENDOCRINE: No polyuria or polydipsia.  MUSCULOSKELETAL: No arthralgias or myalgias.  NEUROLOGICAL: No focal muscle weakness or numbness.  PSYCHOLOGICAL: No depression or anxiety.   PHYSICAL EXAMINATION:  VITAL SIGNS: Blood pressure 126/71, pulse 63, respirations 20, temperature 97.5, pulse oximetry 94%.  HEENT: Pupils equally reactive to light and accommodation.  NECK: Supple without thyromegaly.  LUNGS: Clear.  CARDIOVASCULAR: Normal JVP. Normal PMI. Regular rate and rhythm. Normal S1, S2. No appreciable gallop, murmur or rub.  ABDOMEN: Soft and nontender. Pulses were intact bilaterally.  MUSCULOSKELETAL: Normal muscle tone.  NEUROLOGIC: The patient is alert and oriented x3. Motor and sensory both grossly intact.   IMPRESSION: A 74 year old female with probable acute on chronic diastolic congestive heart failure secondary to hypertension, obesity, sleep apnea, exacerbated by underlying chronic obstructive pulmonary disease.   RECOMMENDATIONS:  1. Agree with overall current therapy.  2. Continue diuresis.  3. Counseled the patient about the importance of low-sodium diet.  4. Defer further cardiac diagnostics at this time.   ____________________________ Isaias Cowman, MD ap:lb D: 05/11/2014 08:08:19 ET T: 05/11/2014 08:27:46 ET JOB#: [id number]  cc: Isaias Cowman, MD, <Dictator> Valera Castle, MD Isaias Cowman MD ELECTRONICALLY SIGNED 05/28/2014 8:28

## 2015-04-05 NOTE — Discharge Summary (Signed)
PATIENT NAME:  Sara Pierce, Sara Pierce MR#:  [id number] DATE OF BIRTH:  [birth year]  DATE OF ADMISSION:  02/08/2014 DATE OF DISCHARGE:  02/12/2014  For a detailed note, please take a look at the history and physical done by Dr. Vianne Bulls.   DIAGNOSES AT DISCHARGE: Is as follows: 1. Acute respiratory failure secondary to chronic obstructive pulmonary disease exacerbation.  2. Chronic obstructive pulmonary disease exacerbation secondary to acute bronchitis.  3. History of congestive heart failure, diastolic dysfunction.  4. Anxiety.  5. Gastroesophageal reflux disease.  6. Hyperlipidemia.   DIET: The patient is being discharged on a low-sodium, low-fat diet.   ACTIVITY: As tolerated.   Followup with Dr. Johny Drilling in the next 1 to 2 weeks.  The patient is being discharged to a skilled nursing facility.   MEDICATIONS ON DISCHARGE: Are as follows: Bupropion 300 mg daily, simvastatin 40 mg daily, Aldactone 25 mg daily, clobetasol topical ointment to be applied b.i.d., Advair 500/50, 1 puff b.i.d., Spiriva 1 puff daily, quinapril 20 mg 1/2 tab daily, Coreg 3.125 mg b.i.d., Protonix 40 mg b.i.d., albuterol inhaler 4 times a day as needed, magnesium oxide 400 mg daily DuoNebs 4 times a day as needed, Lasix 40 mg daily, Zofran 4 mg t.i.d. as needed, Tylenol with oxycodone 5/325, 1 to 2 tabs q.4-6h. as needed,  Xanax 0.25 mg q.8 hours as needed, Levaquin 5 mg daily x 5 days, prednisone taper starting at 60 mg down to 10 mg over the next six days and Robitussin-DM 5 mL q.4h. as needed for cough.   PERTINENT STUDIES DONE DURING THE HOSPITAL COURSE: Are as follows: A chest x-ray done on admission showing no acute cardiopulmonary disease, Cardiomegaly. An ultrasound of the left lower extremity showing no evidence of any DVT. A 2-dimensional echocardiogram done showing ejection fraction of 50% to 55%, mildly dilated left atrium, mildly dilated right atrium, mildly elevated pulmonary artery systolic pressure. Mild  tricuspid regurgitation.   HOSPITAL COURSE: This is a 74 year old female with medical problems as mentioned above, presented to the hospital 02/08/2014 due to shortness of breath and cough and noted to be in acute respiratory failure.  1. Acute respiratory failure. This was likely secondary to COPD. Initially it was thought that she may have some underlying CHF, but that was ruled out. The most likely cause of her respiratory failure was COPD exacerbation. The patient was treated with IV steroids, around-the-clock nebulizer treatments, maintained on her Advair and Spiriva and also given empiric antibiotics with ceftriaxone and Zithromax. She has significantly improved since admission, has less wheezing and bronchospasm. Is currently being discharged on oral prednisone taper along with empiric Levaquin for the next few days. She will still continue maintenance inhalers including Advair and Spiriva and also a p.r.n. DuoNebs and albuterol inhaler. The patient is already on chronic oxygen at home at 2 liters.  2. COPD exacerbation. This is likely the cause of the patient's acute respiratory failure. The patient had no evidence of acute pneumonia as seen on chest x-ray on admission. This was likely secondary to acute bronchitis. The patient was treated with IV steroids, around-the-clock nebulizer treatments, and also maintained on Advair and Spiriva. Clinically much improved since admission with less bronchospasm and wheezing. She still has some upper airway rhonchi but they have improved. At this point, she is being discharged on oral prednisone taper along with empiric Levaquin. She will continue her maintenance inhalers as stated. She will also continue Robitussin-DM for the cough.  3. Hyperlipidemia. The patient was maintained  on atorvastatin. She will resume that.  4. History of congestive heart failure. This is a history of chronic diastolic CHF. Her echocardiogram showed normal ejection fraction. She will  continue her Coreg, Lasix and ACE inhibitor and Aldactone as stated.  5. Anxiety. The patient was maintained on her BuSpar and Xanax. She will resume that.  6. Left lower extremity swelling. The patient had an an incidental left lower extremity swelling. She had a fall a week prior to admission. This is probably dependent edema versus related to trauma. The patient underwent a Doppler of her lower extremities which showed no evidence of any DVT. The patient is clinically doing well from that standpoint presently.  7. History of chronic kidney disease stage III. The patient's creatinine is currently at baseline and we will continue to monitor.  8. GERD. The patient was maintained on her Protonix and she will resume that.   CODE STATUS: THE PATIENT IS A FULL CODE.   DISPOSITION: She is being discharged to a skilled nursing facility after being evaluated by physical therapy.   TIME SPENT ON DISCHARGE: 40 minutes.   ____________________________ Belia Heman. Verdell Carmine, MD vjs:sg D: 02/12/2014 10:59:20 ET T: 02/12/2014 11:33:37 ET JOB#: [id number]  cc: Belia Heman. Verdell Carmine, MD, <Dictator> Valera Castle, MD  Henreitta Leber MD ELECTRONICALLY SIGNED 02/24/2014 22:33

## 2015-04-05 NOTE — H&P (Signed)
PATIENT NAME:  Sara Pierce, Sara Pierce MR#:  [id number] DATE OF BIRTH:  12-Jun-1941  DATE OF ADMISSION:  05/10/2014  ADMITTING PHYSICIAN: Dr. Gladstone Lighter.  PRIMARY PHYSICIAN: Dr. Kym Groom.  CHIEF COMPLAINT: Difficulty breathing.   HISTORY OF PRESENT ILLNESS: Ms. Nass is a 74 year old Caucasian female with past medical history significant for diastolic congestive heart failure, ejection fraction of 50%, chronic respiratory failure secondary to COPD on 2 liters home oxygen, chronic kidney disease stage III with baseline creatinine around 1.5, hypertension, hyperlipidemia, obstructive sleep apnea on CPAP, chronic venous insufficiency of lower extremities who had a recent hospitalization about 4 weeks ago for CHF exacerbation, comes back with similar complaints again. The patient said since her discharge she was not feeling well. She feels great in the hospital at the time of discharge but whenever she goes home, she starts to have her dyspnea symptoms back on. She is being followed by home health at this time and states she is compliant with her dietary salt intake and also fluid restrictions. She weighs herself at home and feels like she has put on about 10 pounds of weight in the last couple of days. She got an appointment to see her PCP yesterday who felt that her pedal edema was worse. Her lungs sounded coarse and sent her to the ER. The patient was diuresed with IV Lasix in the ER yesterday, discharged home. However, when her home health nurse came back this morning, the patient was having trouble breathing, so she was brought back to the ER. Chest x-ray shows pulmonary vascular congestion. She has 3+ pedal edema bilaterally and is being admitted for CHF exacerbation.   PAST MEDICAL HISTORY:  1. Hypertension.  2. Hyperlipidemia.  3. Chronic diastolic CHF, EF of 13%. Last echocardiogram in March 2015.  4. Obstructive sleep apnea on CPAP.  5. Chronic respiratory failure secondary to COPD on 2 liters home  oxygen.  6. Chronic venous insufficiency of bilateral lower extremities with edema.  7. Depression.   PAST SURGICAL HISTORY:  1. Appendectomy.  2. Bilateral lens implants in both eyes.   ALLERGIES TO MEDICATIONS: ANAPHYLACTIC TO ASPIRIN. ALLERGIC TO CODEINE AND ALSO TAPE.  CURRENT HOME MEDICATIONS: The patient states she is still on the same medications that she is discharged on, which include: 1. Bupropion 300 mg p.o. daily.  2. Advair 500/50 one puff b.i.d.  3. Spiriva inhaler 1 puff daily.  4. Coreg 3.125 mg p.o. b.i.d.  5. ProAir inhaler 2 puffs 4 times a day.  6. Magnesium oxide 400 mg p.o. daily.  7. Simvastatin 40 mg p.o. daily.  8. Fluoxetine 40 mg a daily.  9. Klonopin 0.5 mg b.i.d. p.r.n. for anxiety.  10. Nystatin topically twice a day for yeast infection.  11. Hydrochlorothiazide 25 mg p.o. daily.  Of note her Lasix and Aldactone were held at the time of last discharge though she was started back by her PCP and she states she is currently taking it once a day.   SOCIAL HISTORY: Lives at home, ambulates with the help of a walker at baseline. No history of any smoking. Husband smokes but outside the home. No alcohol abuse.   FAMILY HISTORY: Both parents and brother with heart disease and congestive heart failure.   REVIEW OF SYSTEMS:   CONSTITUTIONAL: No fever, fatigue, or weakness.  EYES: Positive for blurred vision, no inflammation, glaucoma or cataracts.  ENT: No tinnitus, ear pain, hearing loss, epistaxis, or discharge.  RESPIRATORY: Positive for cough, wheezing, and COPD. Positive for dyspnea.  CARDIOVASCULAR: No chest pain, positive for orthopnea, PND, pedal edema, dyspnea on exertion. No syncope or palpitations.   GASTROINTESTINAL: Positive for nausea, no vomiting, diarrhea, abdominal pain, hematemesis, or melena. GENITOURINARY: No dysuria, hematuria, renal calculus, frequency, or incontinence.  ENDOCRINE: No polyuria, nocturia, thyroid problems, heat or cold  intolerance.  HEMATOLOGY: No anemia, easy bruising, or bleeding.  SKIN: No acne, rash, or lesions.  MUSCULOSKELETAL: No neck, back, shoulder pain, arthritis, or gout.  NEUROLOGIC: No numbness, weakness, CVA, TIA, or seizures.  PSYCHOLOGICAL: No anxiety, insomnia or depression.   PHYSICAL EXAMINATION:  VITAL SIGNS: Temperature 98.1 degrees Fahrenheit, pulse 70, respirations 22, blood pressure 140/70, pulse oximetry 95% on 4 liters oxygen.  GENERAL: Heavily built, well-nourished female sitting in bed, not in any acute distress.  HEENT: Normocephalic, atraumatic. Pupils equal, round, reacting to light. Anicteric sclerae. Extraocular movements intact. Oropharynx clear without erythema, mass, or exudates.  NECK: Supple. No thyromegaly, JVD, or carotid bruits. No lymphadenopathy.  LUNGS: Moving air bilaterally. Significant scattered expiratory wheeze bilaterally heard and bibasilar crackles heard. There is no use of accessory muscles for breathing.  CARDIOVASCULAR: S1, S2, regular rate and rhythm. No murmurs, rubs, or gallops.  ABDOMEN: Obese, soft, nontender, nondistended. No hepatosplenomegaly. Normal bowel sounds.  EXTREMITIES: She has 3+ edema of bilateral lower extremities. No clubbing or cyanosis. Significant varicosities, unable to palpate any dorsalis pedis pulses.  SKIN: No acne, rash, or lesions.  LYMPHATICS: No cervical lymphadenopathy.  NEUROLOGIC: Cranial nerves II-XII remain intact. No motor or sensory deficits.  PSYCHOLOGICAL: The patient is awake, alert, oriented x 3.   LABORATORY DATA: WBC 11.4, hemoglobin 11.5, hematocrit 35.2, platelet count 221,000.   Sodium 139, potassium 3.3, chloride 97, bicarbonate 36, BUN 21, creatinine 1.3, glucose 97, and calcium of 8.9. Troponin less than 0.02.   Chest x-ray: Left basilar atelectasis and central venous pulmonary congestion noted. EKG showing normal sinus rhythm with some PVCs. Heart rate of 63. Right bundle branch block noted. No acute  ST-T wave elevations.   ASSESSMENT AND PLAN: A 74 year old female with history of chronic obstructive pulmonary disease on 2 liters home oxygen, diastolic congestive heart failure, hypertension, chronic kidney disease, multiple admissions for congestive heart failure who was last discharged about a month ago. Lasix held at the time due to acute renal failure and started back as an outpatient, comes to the ER with dyspnea on exertion and congestive heart failure exacerbation.  1. Acute on chronic congestive heart failure exacerbation with diastolic dysfunction, ejection fraction of 50%, Lasix started back at b.i.d. dosing IV. Cardiology consult. Anaphylactic to aspirin. She follows with Dr. Nehemiah Massed as an outpatient. Continue Coreg and statin, weight checks, I's and O's, dietary restrictions. She will need appointment with CHF clinic at the time of discharge.  2. Acute on chronic chronic obstructive pulmonary disease exacerbation on 2 liters home oxygen. She is a nonsmoker, started on steroids and nebulizers. Continue her inhalers and theophylline. Also CPAP at bedtime for her obstructive sleep apnea. Bronchitis changes with cough and chills at home so will add Levaquin as well.  3. Chronic kidney disease. Monitor while on Lasix. She went into acute renal failure the last admission she was here and seen by nephrology so continue to monitor baseline creatinines around 1.5. She has chronic kidney disease stage III. 4. Hypertension. Continue home medications. 5. Depression and anxiety. Continue home medications.  6. Deep vein thrombosis prophylaxis with subcutaneous heparin.   TIME SPENT ON ADMISSION: 50 minutes.   ____________________________ Gladstone Lighter, MD  rk:lt D: 05/10/2014 20:28:41 ET T: 05/10/2014 22:41:11 ET JOB#: [id number]  cc: Gladstone Lighter, MD, <Dictator> Valera Castle, MD Gladstone Lighter MD ELECTRONICALLY SIGNED 05/25/2014 14:08

## 2015-04-05 NOTE — Consult Note (Signed)
Present Illness Pt with history of hypertension, oxygen dependent copd, pulmonary hypertension with obesity and sleep apnea for which she states she is not on cpap or bipap with recent echo revealing ef of 60% with moderate tr and pulmnary hypertension. She was recently admitted with shortness of braetha nd felt to have a copd flare. She was treated with bronchodilators and prednisone and discharged yesterday. She states she was not short of breath when she was discharged. When arriving home she states she ate a meal of baked potato, cube steak and after eating developed shortness of breath with progressed to where she was brought back to the er. Her bnp was elevated over previous. CXR suggested mild pulmlnary edema. She has improved with bipap, diuresis and oxygen. She is quite anxious and she feels that anxiety attacks are exacerbating her symptoms.   Physical Exam:  GEN obese   HEENT PERRL, hearing intact to voice   NECK supple   RESP normal resp effort  no use of accessory muscles  wheezing  rhonchi  crackles   CARD Regular rate and rhythm  Murmur  distant heart sounds   Murmur Systolic   Systolic Murmur axilla   ABD denies tenderness  distended  distant heart sounds due to obesity   LYMPH negative neck   EXTR negative cyanosis/clubbing, positive edema   SKIN normal to palpation   NEURO cranial nerves intact, motor/sensory function intact   PSYCH A+O to time, place, person   Review of Systems:  Subjective/Chief Complaint short of breath   General: Fatigue  Weakness  Trouble sleeping   Skin: No Complaints   ENT: No Complaints   Eyes: No Complaints   Neck: No Complaints   Respiratory: Short of breath  Wheezing   Cardiovascular: Dyspnea  Edema   Gastrointestinal: No Complaints   Genitourinary: No Complaints   Vascular: No Complaints   Musculoskeletal: No Complaints   Neurologic: No Complaints   Hematologic: No Complaints   Endocrine: No Complaints    Psychiatric: No Complaints   Review of Systems: All other systems were reviewed and found to be negative   Medications/Allergies Reviewed Medications/Allergies reviewed     sleep apnea:    CHF:    COPD:   EKG:  Abnormal NSSTTW changes   Interpretation no ischemia    Aspirin: Swelling  Codeine: Itching  Tape: Unknown   Impression 74 yo female with histoyr of oxygen dependent copd, sleep apnea with out cpap, morbid obesity, hypertension who was readmitted after recent admission for copd exacerbation. She has mild pulmonary huypertension documented by [facility] in 2014 with pa pressure of 44 mmHg. She was treated with bronchodilators and discharged yesterday on a prednisone taper. She ate a fairly high sodium meal and then became short of breath and returned to er where she was relatively hypoxic and noted to have increased bnp and cnetral pulmonary edema on cxr. She has improved with bipap and diuresis with emperic antibiotics and bronchodilators. Appears to have acute on chronic diastollic chf, nyha class iv. Has preserved lv funciton. Pt has anxiety disorder and this may be palying a role as well Is on welbutrin   Plan 1. Gentle diuresis following improvement in symptoms and follow electrolytes, renal funciton and hyedynamics 2. Low sodium diet 3. Weight loss 4. Use bipap in hospital and consider sleep study with outpatient cpap/bipap 5. Continue bronchodilators and steroid taper. 6.Will follow with you   Electronic Signatures: Teodoro Spray (MD)  (Signed 01-Feb-15 10:19)  Authored: General  Aspect/Present Illness, History and Physical Exam, Review of System, Past Medical History, EKG , Allergies, Impression/Plan   Last Updated: 01-Feb-15 10:19 by Teodoro Spray (MD)

## 2015-04-05 NOTE — H&P (Signed)
PATIENT NAME:  Sara Pierce, Sara Pierce MR#:  [id number] DATE OF BIRTH:  [birth year]  DATE OF ADMISSION:  01/10/2014  REFERRING PHYSICIAN: Valli Glance. Owens Shark, MD  PRIMARY CARE PHYSICIAN: [facility].   CHIEF COMPLAINT: Shortness of breath.   HISTORY OF PRESENT ILLNESS: This is a 74 year old female with history of COPD, on 2 liters nasal cannula at baseline, with multiple admissions in the past for COPD exacerbation. The patient presents with complaints of shortness of breath, significant wheezing and cough with productive sputum, initially white in color, but most recently turned into yellow and green in color, as per the patient, for the last 24 hours. The patient denies any fever, chills, diaphoresis. The patient was hypoxic on 2 liters by EMS, so she was started on BiPAP. Currently, the patient saturating 100% on BiPAP. As well, the patient reports she had an episode of chest pain during that episode of shortness of breath prior to arrival. Currently, denies any chest pain and reports it resolved without any intervention. THE PATIENT STATES SHE HAS ASPIRIN ALLERGY, SO SHE COULD NOT BE GIVEN ANY ASPIRIN. As well, the patient is known to have history of chronic kidney disease, with baseline creatinine around 2 to 1.6. Today, creatinine was 1.48. Her chest x-ray did not show any acute opacity or infiltrate, no acute findings. Hospitalist service requested to admit the patient. The patient had significant wheezing upon presentation, improved after receiving IV Solu-Medrol.   PAST MEDICAL HISTORY:  1. Obesity.  2. Benign hypertension.  3. Hyperlipidemia.  4. Congestive heart failure, diastolic, compensated.  5. Pulmonary hypertension.  6. COPD.  7. Chronic dermatitis.  8. Chronic venous insufficiency of left lower extremity.  9. Mild depression, controlled.  10. Chronic respiratory failure, on 2 liters oxygen.   ALLERGIES: ASPIRIN, CODEINE AND TAPE.   SOCIAL HISTORY: The patient used to be heavy smoker  for over 40 years. She smoked 1 pack a day, quit 8 years ago. No alcohol. No illicit drugs. She is retired. Lives at home with her husband.   FAMILY HISTORY: Significant for diabetes and stroke in her parents.   PAST SURGICAL HISTORY:  1. Appendectomy.  2. History of cardiac catheterization.   HOME MEDICATIONS:  1. Aldactone 25 mg oral daily.  2. Quinapril 10 mg oral daily.  3. Simvastatin 40 mg oral at bedtime.  4. Coreg 3.125 mg oral 2 times a day.  5. Advair Diskus 500/50 one puff b.i.d.  6. DuoNebs 4 times a day as needed.  7. ProAir as needed.  8. Spiriva 18 mcg inhalational daily.  9. Lasix 40 mg oral daily.  10. Nystatin/triamcinolone topical cream 3 times a day.  11. Clobetasol topical ointment 2 times a day.  12. Ammonium lactate topical 12% topical cream 2 times a day.  13. Magnesium oxide 400 mg oral daily.  14. Pantoprazole 40 mg oral 2 times a day.  15. Bupropion 300 mg oral daily.   REVIEW OF SYSTEMS:  CONSTITUTIONAL: The patient denies fever, chills. Complains of fatigue, weakness. Denies weight gain, weight loss.  EYES: Denies blurry vision, double vision, inflammation, glaucoma.  ENT: Denies any tinnitus, ear pain, hearing loss, epistaxis or discharge.  RESPIRATORY: Complains of cough, wheezing, dyspnea and COPD.  CARDIOVASCULAR: Reports a single episode of brief chest pain. Denies edema, arrhythmia, palpitation, syncope.  GASTROINTESTINAL: Denies nausea, vomiting, diarrhea, abdominal pain, hematemesis, jaundice, rectal bleed.  GENITOURINARY: Denies dysuria, hematuria, renal colic.  ENDOCRINE: Denies polyuria or polydipsia, heat or cold intolerance.  HEMATOLOGY: Denies anemia,  easy bruising, bleeding diathesis.  INTEGUMENTARY: Denies acne, rash.  MUSCULOSKELETAL: Denies any swelling, gout, arthritis or cramps.  NEUROLOGIC: Denies CVA, TIA, headache, vertigo, tremor.  PSYCHIATRIC: Denies anxiety, insomnia or schizophrenia.   PHYSICAL EXAMINATION:  VITAL SIGNS:  Temperature 97.9, pulse 64, respiratory rate 20, blood pressure 134/60, saturating 100% on BiPAP.  GENERAL: Morbidly obese female who looks comfortable in bed, in no apparent distress.  HEENT: Head atraumatic, normocephalic. Pupils equally reactive to light. Pink conjunctivae. Anicteric sclerae. Wearing facial BiPAP. Moist oral mucosa.  NECK: Supple. No thyromegaly. No JVD. No carotid bruits.  CHEST: Had decreased air entry bilaterally with diffuse wheezing. No rales or rhonchi.  CARDIOVASCULAR: S1, S2 heard. No rubs, murmurs or gallops.  ABDOMEN: Obese, soft, nontender, nondistended. Bowel sounds present.  EXTREMITIES: No edema. No clubbing. No cyanosis. Pedal pulses +2 bilaterally. Has signs of chronic venous insufficiency and discoloration of her skin and small variceal veins.  NEUROLOGIC: Cranial nerves grossly intact. Motor 5 out of 5. No focal deficits.  PSYCHIATRIC: Appropriate affect. Awake, alert x3. Intact judgment and insight.  SKIN: Normal skin turgor. Warm and dry. No rash. No petechiae.  LYMPHATIC: No cervical lymphadenopathy could be appreciated.   PERTINENT LABORATORY DATA: Glucose 112. BNP 413. BUN 36, creatinine 1.48, sodium 132, potassium 4.7, chloride 97, CO2 29, ALT 17, AST 21, alkaline phosphatase 76, total bilirubin 0.4. Troponin less than 0.02. Total CK 39, CK-MB 1.3. White blood cells 13.8, hemoglobin 13.2, hematocrit 38.7, platelets 245. EKG showing normal sinus rhythm at 67 beats per minute, incomplete right bundle branch block. Chest x-ray showing no acute pulmonary findings.   ASSESSMENT AND PLAN:  1. Chronic obstructive pulmonary disease exacerbation. The patient presents with significant wheezing, cough, productive sputum. She will be admitted for chronic obstructive pulmonary disease exacerbation. Will start her on IV Solu-Medrol, DuoNeb every 4 hours and p.r.n. albuterol. Will continue her on her home medications, including Spiriva and Advair. Given the fact that she  has productive green sputum, will start her on Rocephin and azithromycin.  2. Chest pain. The patient had a brief episode of chest pain, resolved without any intervention. HAS ASPIRIN ALLERGY, SO CANNOT BE GIVEN ANY ASPIRIN. Will continue to cycle cardiac enzymes and follow the trend. Does not have any significant ST changes in her EKG.  3. Hypertension. Blood pressure acceptable. Continue with home medications, including quinapril and Coreg.  4. Hyperlipidemia. Continue with statin.  5. Congestive heart failure. The patient is known to have history of systolic heart failure, appears to be compensated. Will hold her Lasix and Aldactone currently until she is more stable.  6. History of depression. Continue with bupropion.  7. Chronic respiratory failure. Currently, the patient on BiPAP. Will try to wean her off BiPAP and put her back on her 2 liters nasal cannula.  8. Deep vein thrombosis prophylaxis. Subcutaneous heparin. 9. Gastrointestinal prophylaxis, on PPI.   CODE STATUS: Discussed with the patient and reports she is a full code.   TOTAL TIME SPENT ON ADMISSION AND PATIENT CARE: 55 minutes.   ____________________________ Albertine Patricia, MD dse:lb D: 01/10/2014 07:46:07 ET T: 01/10/2014 08:07:54 ET JOB#: [id number]  cc: Albertine Patricia, MD, <Dictator> DAWOOD Graciela Husbands MD ELECTRONICALLY SIGNED 01/13/2014 4:05

## 2015-04-05 NOTE — Discharge Summary (Signed)
PATIENT NAME:  Sara Pierce, Sara Pierce MR#:  [id number] DATE OF BIRTH:  [birth year]  DATE OF ADMISSION:  01/13/2014 DATE OF DISCHARGE:  01/15/2014  For a detailed note, please take a look at the history and physical done on admission by Dr. Waldron Labs.     DIAGNOSES AT DISCHARGE: As follows: Congestive heart failure, acute on chronic diastolic dysfunction. Chronic obstructive pulmonary disease. Hypertension. Obesity. Buttock fungal rash.  Anxiety.   DIET: The patient is being discharged on a low-sodium, low-fat diet.   ACTIVITY: As tolerated.   FOLLOWUP: With Dr. Jordan Hawks and also with primary care in the next 1 to 2 weeks.   DISCHARGE MEDICATIONS ARE AS FOLLOWS: Bupropion 300 mg daily, simvastatin 40 mg daily, Aldactone 25 mg daily, clobetasol applied b.i.d., Advair 500/50 one puff b.i.d., Spiriva 1 puff daily, quinapril 20 mg 1/2 tab daily, Coreg 3.125 mg b.i.d., Protonix 40 mg b.i.d., albuterol inhaler 2 puffs q.i.d., magnesium oxide 400 mg daily, DuoNebs q.i.d. as needed, prednisone taper starting at 20 mg down to 10 mg in the next couple days, nystatin/triamcinolone topical cream to be applied to the buttocks area t.i.d., Lasix 40 mg daily and Xanax 0.25 mg q.8 hours as needed.   The patient is being discharged with home health nursing and physical therapy services.   CONSULTANTS DURING THE HOSPITAL COURSE: Dr. Jordan Hawks from cardiology.   PERTINENT STUDIES DONE DURING THE HOSPITAL COURSE: A chest x-ray done on admission showing stable cardiomegaly with central pulmonary vascular congestion.   HOSPITAL COURSE: This is a 74 year old female who presented to the hospital shortly after being discharged due to shortness of breath and noted to be in congestive heart failure.  1.  Congestive heart failure. This was likely the cause of the patient's shortness of breath. This was likely acute on chronic diastolic dysfunction. The patient was in the hospital for COPD exacerbation previously and her diuretics  were held due to some hypotension. She was admitted to the hospital, started on IV Lasix and diuresed fairly well with it, was about 7 liters negative since admission. Her Lasix has been switched over to oral Lasix.  She is being discharged on 40 mg of Lasix along with her Aldactone, Coreg and quinapril. The patient was seen by cardiology, who agreed with this management and they will continue followup with her as an outpatient.  2.  COPD.  The patient was recently admitted with COPD exacerbation. She was still finishing up her prednisone taper and Levaquin. She still has a few more days of prednisone taper left that she will finish. She will continue Advair, Spiriva and p.r.n. DuoNebs. She has finished her antibiotic course for now.  3.  Hypertension. The patient remained hemodynamically stable on her Coreg and quinapril. She will resume that.  4.  Hyperlipidemia. The patient was maintained on her simvastatin. She will resume that.  5.  Anxiety. The patient was maintained on her Wellbutrin. I did add some Xanax as her anxiety was contributing to her shortness of breath.  6.  A fungal rash on her buttocks. This was present per the patient prior to coming in. She was she was already on a nystatin/triamcinolone cream. She will continue that at home.   She is being discharged with home health physical therapy and nursing services.   TIME SPENT ON DISCHARGE: 40 minutes.    ____________________________ Belia Heman. Verdell Carmine, MD vjs:cs D: 01/15/2014 17:38:00 ET T: 01/15/2014 20:51:28 ET JOB#: [id number]  cc: Belia Heman. Verdell Carmine, MD, <Dictator> Javier Docker. Fath,  MD Primary care  Henreitta Leber MD ELECTRONICALLY SIGNED 01/17/2014 11:51

## 2015-04-05 NOTE — Discharge Summary (Signed)
PATIENT NAME:  Sara Pierce, Sara Pierce MR#:  [id number] DATE OF BIRTH:  [birth year]  DATE OF ADMISSION:  05/10/2014 DATE OF DISCHARGE:  05/13/2014  PRESENTING COMPLAINT: Shortness of breath and leg swelling.   DISCHARGE DIAGNOSES:  1. Acute on chronic diastolic congestive heart failure.  2. Chronic leg edema, with improvement in leg edema after diuresis.  3. Morbid obesity.  4. Anxiety.   CODE STATUS: Full code.   MEDICATIONS:  1. Bupropion 300 mg p.o. daily.  2. Advair 500/50 one puff b.i.d.  3. Spiriva 18 mcg inhalation once a day.  4. Carvedilol 3.125 b.i.d.  5. ProAir 2 puffs 4 times a day.  6. Mag-Ox 1 tablet orally daily.  7. Simvastatin 40 mg p.o. daily at bedtime.  8. Fluoxetine 20 mg 2 capsules once a day.  9. Clonazepam 0.5 mg 1 capsule b.i.d. as needed.  10. Nystatin apply to affected area b.i.d.  11. Hydrochlorothiazide 25 mg daily.  12. Clotrimazole 1% topical cream to affected area b.i.d.  13. Nexium 40 mg daily.  14. Quinapril 5 mg daily.  15. Spironolactone 100 mg daily.  16. Theophylline 100 mg b.i.d.  17. Potassium chloride 10 mEq p.o. daily.  18. Lasix 40 mg daily.   REFERRAL: Home health physical therapy.   OXYGEN: 2 liters nasal cannula oxygen.   FOLLOWUP: With Dr. Kym Groom, [facility], and Dr. Nehemiah Massed in 1 to 2 weeks.    LABORATORY DATA AT DISCHARGE:  Creatinine 1.71. H and H is 11.8 and 36.0. Potassium is 3.8.  Chest x-ray: Left basilar atelectasis and central venous pulmonary congestion.  Blood cultures negative in 48 hours.  UA negative for UTI.  B-type natriuretic peptide is 464.  HOSPITAL COURSE:  1. Sara Pierce is a 74 year old obese Caucasian female with chronic leg swelling, COPD, on 2 liters home oxygen, with chronic diastolic congestive heart failure, hypertension, CKD, who has had multiple admissions for CHF, was last discharged about a month ago, who came in with increasing shortness of breath and leg swelling. She was admitted with  acute on chronic congestive heart failure, diastolic, with EF of 97%. Lasix was started on IV dosing. She follows with Dr. Nehemiah Massed as outpatient, which she is advised to do so. She was continued on her cardiac meds. The patient was diuresed with IV Lasix. She put out about more than 6 liters of urine output. She felt better, and her leg edema improved. She was changed to oral Lasix.  2. Acute mild chronic obstructive pulmonary disease exacerbation. She is a nonsmoker. She received IV steroids and nebulizers. She will continue inhalers and theophylline at home and also CPAP for her obstructive sleep apnea.  3. Chronic kidney disease stage III, creatinine around 1.5. After giving Lasix, her creatinine went up to 1.7. She will get her metabolic panel checked as outpatient with her primary care.  4. Hypertension. Home meds were continued.  5. Depression, anxiety. The patient is on a few anxiety meds, which were continued.  6. Home physical therapy was set up along with tele-nursing given her chronic CHF.  7. Hospital stay otherwise remained stable.   CODE STATUS: The patient remained a full code.   TIME SPENT: 40 minutes.   ____________________________ Hart Rochester Posey Pronto, MD sap:lb D: 05/14/2014 06:30:53 ET T: 05/14/2014 07:32:16 ET JOB#: [id number]  cc: Krist Rosenboom A. Posey Pronto, MD, <Dictator> Valera Castle, MD Corey Skains, MD Ilda Basset MD ELECTRONICALLY SIGNED 05/27/2014 13:22

## 2015-04-05 NOTE — H&P (Signed)
PATIENT NAME:  Sara Pierce, Sara Pierce MR#:  [id number] DATE OF BIRTH:  10-Apr-1941  DATE OF ADMISSION:  01/13/2014  REFERRING PHYSICIAN:  Dr. Lurline Hare.   PRIMARY CARE PHYSICIAN:  [facility].   PRIMARY CARDRIOLOGIST:  Dr. Nehemiah Massed.   CHIEF COMPLAINT:  Shortness of breath.   HISTORY OF PRESENT ILLNESS:  This is a 74 year old female with baseline chronic respiratory failure due to her COPD on 2 liters nasal cannula at baseline, history of COPD, diastolic congestive heart failure, the patient was admitted last week at [facility] for COPD exacerbation, the patient was discharged yesterday on prednisone taper and levofloxacin by mouth, the patient presents today with shortness of breath, the patient's BNP has been significantly elevated from previous, last time was at 400, today is more than 2000, her chest x-ray showing vascular congestion, as well she has mild pedal edema, during last hospitalization the patient's Aldactone and Lasix were held and she was on IV fluids as well, the patient denies any chest pain, reports her cough is at baseline, but actually it is less productive right now, as well the patient is found to have leukocytosis at 19,000, but this is most likely related to her prednisone use, she is afebrile, denies any dysuria, polyuria.   PAST MEDICAL HISTORY: 1.  Obesity.  2.  Benign hypertension.  3.  Hyperlipidemia.  4.  Congestive heart failure, diastolic.  5.  Pulmonary hypertension.  6.  COPD.  7.  Chronic dermatitis.  8.  Chronic venous insufficiency of left lower extremity.  9.  Mild depression, controlled.  10.  Chronic respiratory failure, on 2 liters oxygen.   ALLERGIES:  ASPIRIN, CODEINE AND TAPE.   SOCIAL HISTORY:  The patient used to be a heavy smoker for over 40 years, used to smoke 1 pack per day.  Quit eight years ago.  No alcohol.  No illicit drugs.   FAMILY HISTORY:  Significant for diabetes and CVA in both parents.   PAST SURGICAL HISTORY: 1.   Appendectomy.  2.  History of cardiac cath.   HOME MEDICATIONS: 1.  Aldactone 25 daily.  2.  Quinapril 10 mg daily.  3.  Simvastatin 40 mg oral at bedtime.  4.  Coreg 3.125 mg oral 2 times a day.  5.  Advair Diskus 500/50 1 puff twice daily.  6.  DuoNebs 4 times a day as needed.  7.  ProAir as needed.  8.  Spiriva 18 mcg inhalational daily.  9.  Lasix 40 mg daily.  10.  Nystatin/triamcinolone topical cream 3 times a day.  11.  Clobetasol topical ointment 2 times a day.  12.  Ammonium lactate topical 12% topical cream 2 times a day.  13.  Magnesium oxide 400 mg daily.  14.  Pantoprazole 40 mg oral 2 times a day.  15.  Bupropion 300 mg oral daily.   REVIEW OF SYSTEMS:  CONSTITUTIONAL:  The patient denies fever, chills.  Complains of fatigue, weakness.  Denies weight gain or loss.  EYES:  Denies blurry vision, double vision, inflammation, glaucoma.  EARS, NOSE, THROAT:  Denies tinnitus, ear pain, hearing loss, epistaxis or discharge.  RESPIRATORY:  Complains of cough, but it improved from her baseline.  Reports some wheezing and dyspnea and COPD.  CARDIOVASCULAR:  Denies any chest pain.  Reports shortness of breath.  Denies palpitation, syncope or arrhythmia.  Reports worsening lower extremity edema.  GASTROINTESTINAL:  Denies nausea, vomiting, diarrhea, abdominal pain, hematemesis, jaundice, rectal bleed.  GENITOURINARY:  Denies dysuria, hematuria,  renal colic.  ENDOCRINE:  Denies polyuria, polydipsia, heat or cold intolerance.  HEMATOLOGY:  Denies anemia, easy bruising, bleeding diathesis.  INTEGUMENTARY:  Denies acne, rash or skin lesions.  MUSCULOSKELETAL:  Denies any swelling, gout, arthritis or cramps.  NEUROLOGIC:  Denies CVA, TIA, headache, vertigo, tremor.  PSYCHIATRIC:  Denies anxiety, insomnia or schizophrenia.   PHYSICAL EXAMINATION: VITAL SIGNS:  Temperature 97.8, pulse 64, respiratory rate 22, blood pressure 130/60, saturating 95% on oxygen 3 liters nasal cannula.   GENERAL:  Obese female who looks comfortable in bed, in no apparent distress.  HEENT:  Head normocephalic.  Pupils equal, reactive to light.  Pink conjunctivae.  Anicteric sclerae.  Moist oral mucosa.  NECK:  Supple.  No thyromegaly.  No JVD.  CHEST:  Good air entry bilaterally, scattered wheezing, bibasilar crackles.  CARDIOVASCULAR:  S1, S2 heard.  No rubs, murmur or gallops.  ABDOMEN:  Obese, soft, nontender, nondistended.  Bowel sounds present.  EXTREMITIES:  Mild pitting edema bilaterally.  No clubbing.  No cyanosis.  Pedal pulses felt bilaterally.  The patient has signs of chronic venous insufficiency and discoloration of her skin and small variceal veins. PSYCHIATRIC:  Appropriate affect.  Awake, alert x 3.  Intact judgment and insight.  NEUROLOGIC:  Cranial nerves grossly intact.  Motor 5 out of 5.  No focal deficits.  SKIN:  Normal skin, dry.  No rash.  No petechiae.  LYMPHATIC:  No cervical lymphadenopathy could be appreciated.      LABORATORY DATA:  BNP 2040.  Glucose 102, BUN 41, creatinine 1.31, sodium 132, potassium 4.5, chloride 99, ALT 18, AST 17, alk phos 66.  Troponin 0.03.  White blood cell 19.1, hemoglobin 12.9, hematocrit 38.8, platelets 248.  Urinalysis negative for leukocyte esterase and nitrite.    IMAGING STUDIES:  Chest x-ray showing stable cardiomegaly and central pulmonary vascular congestion.   ASSESSMENT AND PLAN: 1.  Acute congestive heart failure exacerbation, the patient is known to have history of diastolic dysfunction, most recent echocardiogram in September 2014 done at [facility] showing ejection fraction of 65%, during a recent hospitalization patient was off her diuresis and on IV fluids so this is most likely contributed to this episode, the patient will be on Aldactone as well on IV Lasix 40 mg IV twice daily, strict ins and outs, fluid restriction, admitted to tele.  We will cycle her cardiac enzymes and we will consult cardiology Dr. Nehemiah Massed who is  very familiar with the patient.  2.  Leukocytosis.  The patient is afebrile.  This is most likely related to her steroid use.   3.  Chronic obstructive pulmonary disease, her wheezing is minimal.  We will continue her on tapering doses of prednisone and on Levaquin, as well we will resume her back on her home medications including Advair, Spiriva and nebs.  4.  Hypertension:  Blood pressure acceptable.  Continue her on home medication.  5.  Hyperlipidemia.  Continue with statin.  6.  History of depression.  We will continue on bupropion.  7.  Chronic respiratory failure.  The patient on 2 liters nasal cannula.  She is requiring slightly higher oxygen than her baseline.  8.  Deep vein thrombosis prophylaxis.  SubQ heparin.  9.  Gastrointestinal prophylaxis, proton pump inhibitor.  10.  CODE STATUS:  THIS WAS DISCUSSED WITH THE PATIENT, REPORTS SHE IS FULL CODE.   Total time spent on admission and patient care 50 minutes.    ____________________________ Albertine Patricia, MD dse:ea D: 01/13/2014  47:30:85 ET T: 01/13/2014 06:33:20 ET JOB#: [id number]  cc: Albertine Patricia, MD, <Dictator> Braydn Carneiro Graciela Husbands MD ELECTRONICALLY SIGNED 01/14/2014 5:50

## 2015-04-13 NOTE — Discharge Summary (Signed)
PATIENT NAME:  Sara Pierce, Sara Pierce MR#:  [id number] DATE OF BIRTH:  November 07, 1941  DATE OF ADMISSION:  01/14/2015 DATE OF DISCHARGE:  01/16/2015  PRIMARY CARE PROVIDER:  Dr. Kym Groom.    ADMITTING COMPLAINT: Cough, sputum, shortness of breath, and wheezing.   DISCHARGE DIAGNOSES:  1.  Acute chronic obstructive pulmonary disease exacerbation.  2.  Hypertension.  3.  Diastolic congestive heart failure with ejection fraction 50%, no exacerbation at this time.  4.  Anxiety and depression.  5.  Gastroesophageal reflux disease.  6.  Generalized weakness and deconditioning.  7.  Obstructive sleep apnea.  8.  Obesity.  9.  Chronic respiratory failure on 2 liters of home oxygen.  10.  Hyperlipidemia.   CONSULTATIONS: Dr. Alethia Berthold, psychiatry.   PROCEDURES:  Chest x-ray February 2 shows hyperinflation, stable left basilar atelectasis, scarring, or chronic infiltrate, no pulmonary edema, osteopenia and degenerative changes are noted in the thoracic spine.    HISTORY OF PRESENT ILLNESS: This 74 year old Caucasian female with history of COPD, CHF, chronic respiratory failure on home oxygen, presents to the Emergency Room with cough, sputum, shortness of breath, and wheezing. On presentation to the Emergency Room she is dyspneic. She was admitted for further treatment of COPD exacerbation.   HOSPITAL COURSE BY PROBLEM:  1.  Acute COPD exacerbation: The patient was treated with standard regimen of IV steroids transitioned to oral steroids, nebulizer treatments, home inhalers, and Zithromax. Her chest x-ray was negative for pneumonia. After 24 hours of treatment she reported that she felt back to her baseline. She was able to wean down to 2 liters of nasal cannula oxygen which is her baseline. She is discharged in stable condition to follow up with her primary care physician. Note that she will have home health physical therapy upon discharge to help monitor her oxygenation and exercise tolerance.  2.   Generalized weakness and deconditioning: Initially the patient reported that she was not able to get around, unable to walk. But by the day of discharge she was able to walk, but was fairly weak. She was evaluated by physical therapy and will have home health physical therapy after discharge.  3.  Anxiety and depression: By the time of discharge the patient reports that even she feels that her COPD exacerbation was likely exacerbated by excessive anxiety. She was seen by psychiatry during the admission and started on trazodone as needed for sleep. She continues to take fluoxetine and bupropion. She continues on clonazepam. It is felt that she is using that medication appropriately and not excessively. Dr. Weber Cooks added trazodone for sleep and the patient reported that she had much improved sleep, which helped with her anxiety significantly. She will need to discuss this with her primary care provider in the outpatient setting for continued refills and possibly will need to be referred to psychiatry versus counseling for further treatment of uncontrolled anxiety.  4.  Diastolic heart failure with preserved ejection fraction of 50%: No signs of congestive heart failure exacerbation during this visit. No edema or pulmonary edema. She continues on Lasix, Aldactone, beta blocker, and ACE inhibitor. No changes made.  5.  Hypertension: Controlled on her home regimen of Coreg and quinapril. No changes made.  6.  Gastroesophageal reflux disease: Continue on her PPI.  7.  Obesity: It was suggested to the patient that increasing her exercise would both improve her pulmonary function and depression as well as general health.   DISCHARGE PHYSICAL EXAMINATION:  VITAL SIGNS: Temperature 97.9, pulse 79, blood  pressure 109/64, oxygenation 91% on room air:  Rising to the mid 90s on 2 liters nasal cannula.  GENERAL: No acute distress.  RESPIRATORY: Lungs clear to auscultation bilaterally with good air movement. No  respiratory distress.  CARDIOVASCULAR: Regular rate and rhythm. No murmurs, rubs, or gallops. No peripheral edema. Peripheral pulses 2 +.  ABDOMEN: Soft, nontender, nondistended. Bowel sounds are normal, abdomen is obese.  PSYCHIATRIC: The patient alert, oriented. She is calm on the morning of discharge with good insight into her clinical condition.    LABORATORY DATA: Sodium 137, potassium 4.4, chloride 101, bicarbonate 29, BUN 28, creatinine 1.06. Hemoglobin A1c is 5.9. Troponins are less than 0.02. Theophylline is 4.3 which is low. White blood cells 11.7, hemoglobin 12.2, platelets 221,000. MCV is 93.   DISCHARGE MEDICATIONS:  1. Bupropion 300 mg 24 hour extended release 1 tablet once a day.  2. Advair Diskus 500 mcg-50 mcg 1 puff every 12 hours.  3. Spiriva 18 mcg 1 capsule via HandiHaler once a day.  4. Carvedilol 3.125 mg 1 tablet twice a day.  5. Magnesium oxide 400 mg oral tablet 1 tablet once a day.  6. Fluoxetine 20 mg 2 capsules once a day.  7. Clonazepam 0.5 mg 1 tablet twice a day as needed for anxiety.  8. Clotrimazole topical 1% topical cream apply to affected area twice a day.  9. Nexium 40 mg 1 capsule once a day.  10. Theophylline 100 mg extended release 1 tablet twice a day.  11. Furosemide 40 mg 1 tablet once a day.  12. DuoNeb 2.5 mg-0.5 mg/3 mL, 3 mL inhaled 4 times a day as needed for shortness of breath. 13. Prednisone 50 mg 1 tablet once a day for a total of 5 days and then stop.  14. Trazodone 50 mg 1 tablet once a day as needed for insomnia, take at bedtime.  15. Benzonatate 100 mg 1 capsule every 6 hours as needed for cough.  16. Azithromycin 500 mg 1 tablet once a day for a total of 5 days and then stop.   CONDITION ON DISCHARGE: Stable.   DISPOSITION: The patient is discharged home with home health physical therapy.   DISCHARGE INSTRUCTIONS: Wear your oxygen at all times at 2 liters.   DIET: Low-sodium, low-fat, low-cholesterol ADA diet.   ACTIVITY  LIMITATIONS: None.   TIME FRAME FOR FOLLOWUP WITH PRIMARY CARE: 1-2 weeks.   TIME SPENT ON DISCHARGE: 40 minutes.     ____________________________ Earleen Newport. Volanda Napoleon, MD cpw:bu D: 01/20/2015 15:34:00 ET T: 01/20/2015 17:46:14 ET JOB#: [id number]  cc: Valera Castle, MD Earleen Newport. Volanda Napoleon, MD, <Dictator>  Aldean Jewett MD ELECTRONICALLY SIGNED 01/22/2015 18:27

## 2015-04-13 NOTE — H&P (Signed)
PATIENT NAME:  Sara Pierce, Sara Pierce MR#:  [id number] DATE OF BIRTH:  Jan 05, 1941  DATE OF ADMISSION:  01/24/2015  REFERRING PHYSICIAN: Gretchen Short. Beather Arbour, MD   PRIMARY CARE PHYSICIAN: Valera Castle, MD  ADMISSION DIAGNOSIS: Pneumonia.   HISTORY OF PRESENT ILLNESS: This is a 74 year old Caucasian female who presents to the Emergency Department complaining of a cough and shortness of breath. The patient states she was in the hospital 1 week ago with similar symptoms. She was treated for a COPD exacerbation and went home to complete a steroid taper, but admits that she never really stopped coughing. She complains of a lot of shortness of breath. She also states she is very weak, so much  so that she it took her 45 minutes to stand from a seated position on the commode this afternoon. She admits to some nausea, as well as a few episodes of nonbloody, nonbilious emesis. She also states that she has had some subjective fevers. In the Emergency Department, the patient was found to be hypoxic, which is her baseline, although she was quite tachypneic and somewhat uncomfortable, which prompted the Emergency Department to obtain a chest x-ray, which showed a retrocardiac opacity concerning for pneumonia. For the aforementioned reasons, the Emergency Department called for admission.   REVIEW OF SYSTEMS:  CONSTITUTIONAL: The patient is admits to subjective fevers and generalized weakness. EYES: Denies blurred vision or inflammation.  EARS, NOSE, AND THROAT: Denies tinnitus or sore throat.  RESPIRATORY: Admits to cough and some shortness of breath.  CARDIOVASCULAR: Denies chest pain, palpitations, orthopnea or paroxysmal nocturnal dyspnea.  GASTROINTESTINAL: Admits to nausea and vomiting, but denies diarrhea or abdominal pain.  ENDOCRINE: Denies polyuria or polydipsia.  INTEGUMENT: Denies rashes or lesions.  HEMATOLOGIC AND LYMPHATIC: Denies easy bruising or bleeding.  MUSCULOSKELETAL: Denies arthralgias or  myalgias.  NEUROLOGIC: Denies numbness in her extremities or dysarthria.  PSYCHIATRIC: Denies depression or suicidal ideation.   PAST MEDICAL HISTORY: COPD, hypertension, congestive heart failure (diastolic), depression and anxiety.   PAST SURGICAL HISTORY: The patient states that she has not had any surgeries, but previous documentation states that she has had appendectomy as well as lens implantation, bilaterally.   SOCIAL HISTORY: The patient lives with her husband. She quit smoking 15 years ago, but is exposed to secondhand smoke from her husband. She does drink beer sometimes. She denies any illicit drug use.   FAMILY HISTORY: Significant for heart disease in her parents and her brother.   MEDICATIONS:  1.  Advair Diskus 500 mcg/50 mcg inhaled powder 1 puff every 12 hours.  2.  Azithromycin 500 mg 1 tablet p.o. once a day.  3.  Benzonatate 100 mg 1 capsule p.o. every 6 hours as needed for cough.  4.  Bupropion 300 mg per 24-hour extended release tablet, 1 tablet p.o. daily.  5.  Carvedilol 3.125 mg 1 tablet p.o. b.i.d.  6.  Clonazepam 0.5 mg 1 tablet p.o. b.i.d.  7.  Clotrimazole topical ointment 1% cream applied topically to affected area 2 times a day.  8.  DuoNeb 2.5 /0.5 mg per 3 mL inhaled solution, 1 nebulizer treatment 4 times a day as needed for shortness of breath.  9.  Fluoxetine 20 mg 2 capsules p.o. daily.  10.  Furosemide 40 mg 1 tablet p.o. once a day.  11.  Magnesium oxide 400 mg 1 tablet p.o. once a day.  12.  Nexium 40 mg 1 capsule p.o. daily.  13.  Prednisone 50 mg 1 tablet p.o. daily.  14.  Spiriva 18 mcg 1 inhaled capsule daily.  15.  Theophylline 100 mg extended release 1 tablet p.o. b.i.d.  16.  Trazodone 50 mg 1 tablet p.o. daily at bedtime.   ALLERGIES: ASPIRIN, CODEINE AND TAPE.   PHYSICAL EXAMINATION:  VITAL SIGNS: Temperature is 98.4, pulse 73, respirations 18, blood pressure is 115/70, pulse oximetry is 95% on 2 L of oxygen via nasal cannula.   GENERAL: The patient is alert and oriented x 3, in no apparent distress.  HEENT: Normocephalic, atraumatic. Pupils equal, round, and reactive to light and accommodation. Extraocular movements are intact. Mucous membranes are moist.  NECK: Trachea is midline. No adenopathy. Thyroid is symmetrical and atraumatic.  CARDIOVASCULAR: Regular rate and rhythm. Normal S1, S2. No rubs, clicks or murmurs appreciated.  LUNGS: Some diminished breath sounds at the bases, bilaterally, but there is no egophony.  ABDOMEN: Positive bowel sounds. Soft, nontender, nondistended. No hepatosplenomegaly.  GENITOURINARY: Deferred.  MUSCULOSKELETAL: The patient moves all 4 extremities equally, but I have not tested her gait.  SKIN: Warm and dry. No rashes or lesions.  EXTREMITIES: No clubbing, cyanosis, or edema.  NEUROLOGIC: Cranial nerves II through XII are grossly intact.  PSYCHIATRIC: Mood is normal. Affect is congruent. The patient has good insight and judgment into her medical condition.   PERTINENT LABORATORY RESULTS AND RADIOGRAPHIC FINDINGS: Serum glucose is 120, BUN is 20, creatinine is 1.08; serum sodium 136, potassium is 3.3, chloride is 96, bicarbonate is 34,  calcium is 7.9. Troponin is negative. White blood cell count is 25.1, hemoglobin is 10.7, hematocrit is 32.5. Urinalysis is negative for infection. Chest x-ray shows a retrocardiac air space opacity that could reflect pneumonia given her symptoms. There is also mild right perihilar space opacity suggesting a small left pleural effusion. There is borderline cardiomegaly, as well.   ASSESSMENT AND PLAN: This is a 74 year old female admitted for healthcare-acquired pneumonia.  1.  Pneumonia. The patient was admitted to the hospital no more than 10 days ago; thus we will treat her for healthcare-acquired pneumonia.  She was given Levaquin in the Emergency Department, and I will add vancomycin. We will wean supplemental oxygen back to her baseline that she  wears at home.  2.  Sepsis. The patient meets criteria by respiratory rate as well as leukocytosis; however, we need to keep in mind that she has had 1 week of steroids prior to admission. Although it is unlikely that her steroids would make her white blood cell count increased this much, it is doubtful that she has septicemia. She is hemodynamically stable, and we will continue to monitor for signs or symptoms of sepsis.  3.  Congestive heart failure, diastolic.  I will continue Coreg and furosemide per her home regimen.  4.  Chronic obstructive pulmonary disease, emphysematous type. I will continue her inhalers as well as theophylline.  5.  Depression and anxiety. Will continue bupropion and sertraline. The patient has clonazepam as needed.  6.  Obesity. The patient's BMI is 41.8. I have encouraged diet and exercise.  7.  Deep vein thrombosis prophylaxis with heparin.  8.  Gastrointestinal prophylaxis: None.   CODE STATUS: The patient is a Full Code.   TIME SPENT ON ADMISSION ORDERS AND PATIENT CARE: Approximately 40 minutes.    ____________________________ Norva Riffle. Marcille Blanco, MD msd:MT D: 01/24/2015 08:05:10 ET T: 01/24/2015 08:17:09 ET JOB#: [id number]  cc: Norva Riffle. Marcille Blanco, MD, <Dictator> Norva Riffle Falen Lehrmann MD ELECTRONICALLY SIGNED 01/26/2015 21:56

## 2015-04-13 NOTE — Consult Note (Signed)
PATIENT NAME:  Sara Pierce, RAWSON MR#:  [id number] DATE OF BIRTH:  [birth year]  DATE OF CONSULTATION:  01/15/2015  REFERRING PHYSICIAN:   CONSULTING PHYSICIAN:  Gonzella Lex, MD  IDENTIFYING INFORMATION AND REASON FOR CONSULTATION: A 74 year old woman with a history of COPD and other medical problems consult for anxiety and depression.   HISTORY OF PRESENT ILLNESS: Information obtained from the patient and the chart. The patient tells me that she is having, what she calls "traumas," which from her description of it are actually tremors. She says that when she tries to stand up, her legs shaky and weak and she feels like she is going to fall. She has had several falls at home over the last few weeks. She also notices that if both she and her husband had found her memory to be getting progressively worse as well as even more prominently her concentration. The patient admits that her mood feels very anxious much of the time and that she has been getting more short-tempered at home. She is not having any suicidal or homicidal ideation or feeling hopeless, but she does feel overwhelmed at times, especially by her medical problems. She will occasionally have anxiety attack for which she will take her clonazepam. She has found her current hospitalization stressful, feeling like she is being pressured to do more than it is possible. She is currently taking antidepressant as well as antianxiety medication.   PAST PSYCHIATRIC HISTORY: The patient has a history of psychiatric treatment in the past. She saw a psychiatrist in [place] for years. Never been in a psychiatric hospital. Denies any history of suicide attempts. The patient knows that she is taking bupropion, but according to her notes is also currently taking fluoxetine. She is taking clonazepam on a p.r.n. basis. She cannot remember what other medicines she has been prescribed in the past.   PAST MEDICAL HISTORY: COPD, overweight, high blood pressure,  gastric reflux symptoms.   FAMILY HISTORY: No known family history of mental illness.   SUBSTANCE ABUSE HISTORY: No history of alcohol or drug problems reported described.   SOCIAL HISTORY: The patient lives with her current husband. They have been married about 17 years. She had a prior marriage, which ended with her husband dying after a protracted illness. She describes her current marriage as very supportive saying her husband is very helpful and supportive to her through her medical problems. The patient clearly not able to work. She has a couple of adult children, but they do not live in the immediate area.   CURRENT MEDICATIONS: Solu-Medrol 60 mg IV p.r.n. for COPD also q. 12 hours, azithromycin 500 mg daily, Spiriva HandiHaler 1 capsule daily, theophylline 100 mg twice a day, spironolactone 100 mg once a day, quinapril 5 mg per day, pantoprazole 40 mg q. 6 a.m.,  morphine p.r.n., furosemide 40 mg a day, magnesium 400 mg a day, fluoxetine 40 mg a day, Advair Diskus 1 puff twice a day, clonazepam 0.5 mg twice a day as needed for anxiety, Coreg 3.125 mg twice a day, Wellbutrin XL 300 mg per day, acetaminophen with hydrocodone 5 mg 1-2 q. 4-6 hours p.r.n. for moderate pain.   CURRENT ALLERGIES: ASPIRIN AND CODEINE.   REVIEW OF SYSTEMS: Continues to feel anxious, but not currently panicky. Mildly depressed, but not hopeless. Still has positive things in her life. No suicidal or homicidal ideation. No hallucinations. Physically, continues to feel weak and has, what she calls a "traumas" in her legs.   MENTAL STATUS  EXAMINATION: Neatly groomed woman, looks her stated age, cooperative with the interview. Good eye contact. Psychomotor activity normal. Speech normal rate, tone, and volume. Affect mildly anxious but reactive. Mood stated as being okay. Thoughts are lucid. No evidence of loosening of associations or delusions. Denies auditory or visual hallucinations. Denies suicidal or homicidal ideation.  She is alert and oriented x 3. Her judgment and insight appears intact. She can repeat 3 words immediately, remembers 2/3 at 3 minutes. Intelligence is normal.   LABORATORY RESULTS: Admission laboratories on the 2nd included abnormalities such as a slightly elevated glucose, but not significant. Negative troponins. B-natriuretic peptide elevated 287. CBC with slightly elevated white count 11.7. Hemoglobin A1c normal limits.   VITAL SIGNS: Blood pressure 102/48, respirations 20, pulse 82, temperature 98.1.   ASSESSMENT: A 74 year old woman with a history of medical problems. She was expressing some anxiety and depression today. She admits that she made an impulsive statement to the hospitalist about how she ought to just go and kill herself, but she is very clear with me that she has no thoughts like that but that she was angry because she felt like her problems were not being taken seriously. The patient is currently taking 2 different antidepressants as well as p.r.n. clonazepam. It is not clear how long she has been on the fluoxetine. The fluoxetine is an appropriate medicine for the anxiety attacks. Wellbutrin is a good antidepressant but not likely to help with anxiety. There is no evidence she is abusing clonazepam. The patient still complains of poor sleep at night. Says that she will occasionally take her husband's trazodone to help her sleep better.   TREATMENT PLAN: Because she is still taking the fluoxetine and bupropion, I am not going to immediately changed either of those two medicines. Her current use of clonazepam appears to be appropriate. I am going to add the trazodone as a p.r.n. for sleep. Psychoeducation and supportive therapy done. The patient does not appear to have a major depression and but probably does have ongoing anxiety related to her medical problems.   DIAGNOSES, PRINCIPAL AND PRIMARY:  AXIS I: Anxiety disorder, not otherwise specified.   SECONDARY DIAGNOSES:  AXIS I:  Depression, not otherwise specified.  AXIS II: Deferred.  AXIS III: Chronic obstructive pulmonary disease, hypertension, rule out heart failure.     ____________________________ Gonzella Lex, MD jtc:bm D: 01/15/2015 23:19:46 ET T: 01/15/2015 23:32:17 ET JOB#: [id number]  cc: Gonzella Lex, MD, <Dictator> Gonzella Lex MD ELECTRONICALLY SIGNED 01/22/2015 17:25

## 2015-04-13 NOTE — H&P (Signed)
PATIENT NAME:  Sara Pierce, Sara Pierce MR#:  [id number] DATE OF BIRTH:  1941/05/05  DATE OF ADMISSION:  01/14/2015  PRIMARY CARE PHYSICIAN:  Dr. Kym Groom.    CHIEF COMPLAINT: Cough, sputum, shortness of breath, and wheezing for the past 2 weeks, worsening 2 days.   HISTORY OF PRESENT ILLNESS: A 74 year old Caucasian female with a history of COPD, CHF, chronic respiratory failure on home oxygen, presented to the ED with the chief complaint. The patient is alert, awake, oriented, in no acute distress. The patient said she has had shortness of breath, cough with sputum, and wheezing for the past 2 weeks. Symptoms have been worsening for the past 2 days. The patient used home oxygen 2 liters, used her nebulizer without improvement, so patient comes to ED for further evaluation. The patient denies any fever or chills. No chest pain, palpitation. No orthopnea or nocturnal dyspnea. Has chronic leg edema. The patient was treated with nebulizer and IV steroids in the ED and admitted for COPD exacerbation.   PAST MEDICAL HISTORY: COPD, chronic respiratory failure on home oxygen 2 liters, diastolic CHF ejection fraction of 50%, hyperlipidemia, OSA, obesity,   PAST SURGICAL HISTORY: Appendectomy, bilateral lens implantation.   FAMILY HISTORY: Parents and brother had heart disease and CHF.    SOCIAL HISTORY: Quit smoking.  No alcohol drinking or illicit drugs.   ALLERGIES: ASPIRIN, CODEINE, TAPE.    HOME MEDICATIONS: Theophylline 100 mg p.o. b.i.d., spironolactone 100 mg p.o. daily, Spiriva 18 mcg inhalation 1 cap once a day, quinapril 5 mg p.o. daily, prednisone 20 mg p.o. 2 tablets p.o. daily, Nexium 40 mg p.o. daily, magnesium oxide 400 mg p.o. daily, Lasix 40 mg p.o. daily, fluoxetine 20 mg p.o. 2 caps once a day, DuoNeb 3 mL inhaled 4 times a day p.r.n., clotrimazole topical 1% topical cream apply topically to affected area once a day, clonazepam 0.5 mg p.o. b.i.d. p.r.n. for anxiety, Coreg 3.125 mg p.o. b.i.d.,  Bupropion 300 mg p.o. daily, Advair 500 mcg-50 mcg every 12 hours.   REVIEW OF SYSTEMS:    CONSTITUTIONAL: The patient denies any fever or chills. No headache or dizziness. No weakness.  EYES: No double vision or blurred vision.  EARS, NOSE, AND THROAT: No postnasal drip, slurred speech, or dysphagia.  CARDIOVASCULAR: No chest pain, palpitation, orthopnea, or nocturnal dyspnea, but has leg edema.  PULMONARY: Positive for cough, sputum, shortness of breath, and wheezing. No hemoptysis.  GASTROINTESTINAL: No abdominal pain, nausea, vomiting, diarrhea. No melena or bloody stool.  GENITOURINARY: No dysuria or hematuria, but has incontinence.  SKIN: No rash or jaundice.  NEUROLOGIC: No syncope, loss of consciousness, or seizure.  ENDOCRINE: No polyuria, polydipsia, heat or cold intolerance.  HEMATOLOGIC: No easy bruising or bleeding.   PHYSICAL EXAMINATION:  VITAL SIGNS: Temperature 97.6, blood pressure 105/68, pulse 76, O2 saturation 95% on room air.  GENERAL: The patient is alert, awake, oriented, in no acute distress.  HEENT: Pupils round, equal, reactive to light and accommodation. Moist oral mucosa. Clear pharynx.  NECK:  Supple.  No JVD or carotid bruit. No lymphadenopathy. No thyromegaly.  CARDIOVASCULAR: S1 and S2. Regular rate and rhythm. No murmurs or gallops.  PULMONARY: Bilateral air entry. Bilateral expiratory wheezing. No crackles or rales. No use of accessory muscle to breathe.  ABDOMEN: Soft, obese. Bowel sounds present. No tenderness or distention. No organomegaly.  EXTREMITIES: Bilateral lower extremity trace edema. No clubbing or cyanosis. No calf tenderness. Bilateral pedal pulses present.  SKIN: No rash or jaundice.  NEUROLOGIC:  A and O x 3. No focal deficit.  Power 5 out of 5. Sensory is intact.   LABORATORY DATA:  Chest x-ray showed hyperinflation.   CBC in normal range. Troponin less than 0.02. Glucose 102, BUN 28, creatinine 1.06. Electrolytes are normal. BNP 287.    EKG shows normal sinus rhythm at 69 BPM with occasional PVC and right bundle branch block.     IMPRESSIONS:  1. Chronic obstructive pulmonary exacerbation.  2. Chronic respiratory failure with home oxygen 2 liters.  3. History of chronic diastolic congestive heart failure, ejection fraction of 50%.  4. Hypertension.  5. Obstructive sleep apnea.    6. Morbid obesity.   7. Hyperlipidemia.   PLAN OF TREATMENT:  1.  The patient will be admitted to medical floor.  I will continue IV Solu-Medrol, give Duoneb, continue Advair, Spiriva. Continue oxygen by nasal cannula.  2.  For chronic diastolic CHF, continue spironolactone and Lasix. The patient has mild dehydration,  but since the patient has CHF we will continue diuretics.  3.  I discussed the patient's condition and plan of treatment with the patient and the patient's husband. The patient wants full code.   TIME SPENT: About 52 minutes.      ____________________________ Demetrios Loll, MD qc:bu D: 01/14/2015 20:28:40 ET T: 01/14/2015 20:46:49 ET JOB#: [id number]  cc: Demetrios Loll, MD, <Dictator> Demetrios Loll MD ELECTRONICALLY SIGNED 01/16/2015 19:10

## 2015-04-13 NOTE — Discharge Summary (Signed)
PATIENT NAME:  Sara Pierce, Sara Pierce MR#:  [id number] DATE OF BIRTH:  [birth year]  DATE OF ADMISSION:  01/24/2015 DATE OF DISCHARGE:  01/29/2015  CHIEF COMPLAINT: Shortness of breath and weakness.   HISTORY: The patient is a 74 year old Caucasian female with a history of COPD presented to the ED with cough, sputum, and shortness of breath with weakness. The patient was discharged after treatment of COPD exacerbation recently.   DIAGNOSES:  1.  Pneumonia.  2.  Sepsis.  3.  Chronic obstructive pulmonary disease.  4.  Chronic diastolic congestive heart failure.   CONDITION: Stable.   CODE STATUS: Full code.   HOME MEDICATIONS: Please refer to the medication reconciliation list.   SERVICES:  The patient needs health and physical therapy and needs oxygen via nasal cannula 2 L.   DIET: Low-sodium, low-fat, low-cholesterol diet.   ACTIVITY: As tolerated.   FOLLOWUP CARE: Follow with PCP and Dr. Vella Kohler within 1-2 weeks.   REASON FOR ADMISSION AND HOSPITAL COURSE: The patient was found hypoxic and has tachycardia in the ED. Chest x-ray showed retrocardiac opacity concerning for pneumonia. For detailed history and physical examination, please refer to the admission note dictated by Dr. Marcille Blanco.   1. Pneumonia. Initially the patient was treated with Levaquin and vancomycin. The patient's WBC has been improving, so vancomycin was discontinued. Dr. Raul Del saw the patient and suggested to continue Levaquin for 10 days. During hospitalization, the patient has 3 episodes of hemoptysis. The patient's sepsis improved after antibiotic treatment.  2. For COPD, the patient has been treated with her home nebulizer and prednisone with a home oxygen 2 L. She needs to follow up with Dr. Raul Del as outpatient.  3. The patient also has chronic congestive diastolic congestive heart failure, has been treated with Lasix.   The patient's symptoms are much better. Her vital signs are stable. She is clinically stable and  will be discharged to home with home health and PT today. I discussed the patient's discharge plan with the patient, nurse, and case manager, also, discussed with Dr. Raul Del.   TIME SPENT: About 38 minutes.    ____________________________ Demetrios Loll, MD qc:bm D: 01/29/2015 16:57:00 ET T: 01/30/2015 03:37:20 ET JOB#: [id number]  cc: Demetrios Loll, MD, <Dictator> Demetrios Loll MD ELECTRONICALLY SIGNED 01/30/2015 18:11

## 2015-04-28 ENCOUNTER — Emergency Department: Payer: Medicare (Managed Care)

## 2015-04-28 ENCOUNTER — Emergency Department
Admission: EM | Admit: 2015-04-28 | Discharge: 2015-04-28 | Disposition: A | Discharge status: Home / Self Care | Payer: Medicare (Managed Care) | Attending: Emergency Medicine | Admitting: Emergency Medicine

## 2015-04-28 ENCOUNTER — Encounter: Payer: Self-pay | Admitting: *Deleted

## 2015-04-28 ENCOUNTER — Other Ambulatory Visit: Payer: Self-pay

## 2015-04-28 DIAGNOSIS — I129 Hypertensive chronic kidney disease with stage 1 through stage 4 chronic kidney disease, or unspecified chronic kidney disease: Secondary | ICD-10-CM | POA: Insufficient documentation

## 2015-04-28 DIAGNOSIS — Y998 Other external cause status: Secondary | ICD-10-CM | POA: Insufficient documentation

## 2015-04-28 DIAGNOSIS — Z7951 Long term (current) use of inhaled steroids: Secondary | ICD-10-CM | POA: Diagnosis not present

## 2015-04-28 DIAGNOSIS — E119 Type 2 diabetes mellitus without complications: Secondary | ICD-10-CM | POA: Diagnosis not present

## 2015-04-28 DIAGNOSIS — Z87891 Personal history of nicotine dependence: Secondary | ICD-10-CM | POA: Insufficient documentation

## 2015-04-28 DIAGNOSIS — T148 Other injury of unspecified body region: Secondary | ICD-10-CM | POA: Insufficient documentation

## 2015-04-28 DIAGNOSIS — S0093XA Contusion of unspecified part of head, initial encounter: Secondary | ICD-10-CM | POA: Diagnosis not present

## 2015-04-28 DIAGNOSIS — L723 Sebaceous cyst: Secondary | ICD-10-CM | POA: Insufficient documentation

## 2015-04-28 DIAGNOSIS — N189 Chronic kidney disease, unspecified: Secondary | ICD-10-CM | POA: Diagnosis not present

## 2015-04-28 DIAGNOSIS — Z79899 Other long term (current) drug therapy: Secondary | ICD-10-CM | POA: Diagnosis not present

## 2015-04-28 DIAGNOSIS — S0990XA Unspecified injury of head, initial encounter: Secondary | ICD-10-CM | POA: Diagnosis present

## 2015-04-28 DIAGNOSIS — Y9389 Activity, other specified: Secondary | ICD-10-CM | POA: Insufficient documentation

## 2015-04-28 DIAGNOSIS — T148XXA Other injury of unspecified body region, initial encounter: Secondary | ICD-10-CM

## 2015-04-28 DIAGNOSIS — W01198A Fall on same level from slipping, tripping and stumbling with subsequent striking against other object, initial encounter: Secondary | ICD-10-CM | POA: Diagnosis not present

## 2015-04-28 DIAGNOSIS — Y9289 Other specified places as the place of occurrence of the external cause: Secondary | ICD-10-CM | POA: Insufficient documentation

## 2015-04-28 MED ORDER — ACETAMINOPHEN 500 MG PO TABS
1000.0000 mg | ORAL_TABLET | Freq: Once | ORAL | Status: AC
  Administered 2015-04-28: 1000 mg via ORAL

## 2015-04-28 MED ORDER — ACETAMINOPHEN 500 MG PO TABS
ORAL_TABLET | ORAL | Status: AC
  Filled 2015-04-28: qty 2

## 2015-04-28 NOTE — Discharge Instructions (Signed)

## 2015-04-28 NOTE — ED Notes (Signed)
Patient transported to CT 

## 2015-04-28 NOTE — ED Provider Notes (Signed)
[facility] Emergency Department Provider Note  ____________________________________________  Time seen: Approximately 8:10 AM  I have reviewed the triage vital signs and the nursing notes.   HISTORY  Chief Complaint Fall    HPI Sara Pierce is a 74 y.o. female with multiple chronic medical problems who presents after a mechanical fall this morning.  She reports that there is a step leading out of house that she is tripped on before.  She tripped on it this morning and fell backwards, striking the back of her head.  She did not lose consciousness but felt dazed.  She has a large "knot" on the back of her head.  She also feels strain in her shoulders.  She denies neck pain, back pain, and any injuries to her extremities.  She denies nausea/vomiting, shortness of breath, chest pain, abdominal pain.Upon review of her medications, she is not taking any blood thinners.   Past Medical History  Diagnosis Date  . COPD (chronic obstructive pulmonary disease)   . Clotting disorder   . Chronic kidney disease   . Depression   . Cardiomyopathy   . Arthritis   . Cataract   . Cerebral hemorrhage   . Diabetes mellitus without complication   . Hypertension   . Obesity   . Sleep apnea   . Urinary frequency   . Vaginal atrophy   . Varicose veins   . Mixed incontinence   . Thyroid disease     Patient Active Problem List   Diagnosis Date Noted  . Absolute anemia 03/15/2015  . Anxiety 03/15/2015  . Arthritis 03/15/2015  . Cardiomyopathy, secondary 03/15/2015  . Cataract 03/15/2015  . Brain bleed 03/15/2015  . Chronic constipation 03/15/2015  . Chronic kidney failure 03/15/2015  . Blood clotting disorder 03/15/2015  . CAFL (chronic airflow limitation) 03/15/2015  . Clinical depression 03/15/2015  . Diabetes 03/15/2015  . H/O varicella 03/15/2015  . BP (high blood pressure) 03/15/2015  . Mixed incontinence 03/15/2015  . Congenital anomaly of skeletal muscle  03/15/2015  . Adiposity 03/15/2015  . Apnea, sleep 03/15/2015  . Disease of thyroid gland 03/15/2015  . FOM (frequency of micturition) 03/15/2015  . 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 Candida vaginitis 03/15/2015  . Abnormal ECG 03/15/2015    Past Surgical History  Procedure Laterality Date  . Appendectomy    . Cardiac catherization    . Pacemaker insertion    . Joint replacement    . Bilateral oophorectomy    . Spine surgery    . Strabismus surgery    . Tonsillectomy    . Tubal ligation      Current Outpatient Rx  Name  Route  Sig  Dispense  Refill  . albuterol (PROVENTIL HFA;VENTOLIN HFA) 108 (90 BASE) MCG/ACT inhaler   Inhalation   Inhale into the lungs.         Marland Kitchen buPROPion (WELLBUTRIN XL) 300 MG 24 hr tablet   Oral   Take by mouth.         Marland Kitchen CALCIUM-PHOSPHORUS-VITAMIN D PO   Oral   Take by mouth.         . carvedilol (COREG) 6.25 MG tablet   Oral   Take by mouth.         . clotrimazole-betamethasone (LOTRISONE) cream      LOTRISONE, 1-0.05% (External Cream) - Historical Medication  (1-0.05 %) Active         . cyanocobalamin 100 MCG tablet  Oral   Take by mouth.         . estradiol (ESTRACE) 0.1 MG/GM vaginal cream   Vaginal   Place vaginally.         Marland Kitchen FLUoxetine (PROZAC) 20 MG capsule   Oral   Take by mouth.         . fluticasone (FLONASE) 50 MCG/ACT nasal spray   Nasal   Place into the nose.         Marland Kitchen Fluticasone-Salmeterol (ADVAIR) 100-50 MCG/DOSE AEPB   Inhalation   Inhale into the lungs.         . furosemide (LASIX) 40 MG tablet   Oral   Take by mouth.         Marland Kitchen ipratropium (ATROVENT HFA) 17 MCG/ACT inhaler   Inhalation   Inhale into the lungs.         Marland Kitchen ipratropium-albuterol (DUONEB) 0.5-2.5 (3) MG/3ML SOLN   Inhalation   Inhale into the lungs.         Marland Kitchen liothyronine (CYTOMEL) 25 MCG tablet   Oral   Take by mouth.         . magnesium oxide (MAG-OX) 400 MG tablet   Oral    Take by mouth.         . montelukast (SINGULAIR) 10 MG tablet   Oral   Take by mouth.         . Nystatin (Nolic) 100000 UNIT/GM POWD      NYAMYC, 100000 UNIT/GM (External Powder) - Historical Medication  (100000 UNIT/GM) Active         . potassium chloride (K-DUR) 10 MEQ tablet   Oral   Take by mouth.         . predniSONE (DELTASONE) 50 MG tablet   Oral   Take by mouth.         . primidone (MYSOLINE) 50 MG tablet   Oral   Take by mouth.         . quinapril (ACCUPRIL) 5 MG tablet   Oral   Take by mouth.         . ranitidine (ZANTAC) 150 MG tablet   Oral   Take by mouth.         . simvastatin (ZOCOR) 40 MG tablet   Oral   Take by mouth.         . spironolactone (ALDACTONE) 100 MG tablet   Oral   Take by mouth.         . theophylline (THEODUR) 100 MG 12 hr tablet   Oral   Take by mouth.         . tiotropium (SPIRIVA) 18 MCG inhalation capsule   Inhalation   Place into inhaler and inhale.           Allergies Aspirin; Codeine; and Tape  Family History  Problem Relation Age of Onset  . Heart attack Father   . Coronary artery disease Father   . Hypertension Father   . Hyperlipidemia Father   . Heart attack Mother   . Hypertension Mother   . Hyperlipidemia Mother     Social History History  Substance Use Topics  . Smoking status: Former Research scientist (life sciences)  . Smokeless tobacco: Not on file  . Alcohol Use: Yes     Comment: occasional    Review of Systems Constitutional: No fever/chills Eyes: No visual changes. ENT: No sore throat. Cardiovascular: Denies chest pain. Respiratory: Denies shortness of breath. Gastrointestinal: No abdominal pain.  No nausea, no  vomiting.  No diarrhea.  No constipation. Genitourinary: Negative for dysuria. Musculoskeletal: Pain across the back of her shoulders, no neck pain or back pain Skin: Negative for rash. Neurological: Negative for headaches, focal weakness or numbness.  10-point ROS otherwise  negative.  ____________________________________________   PHYSICAL EXAM:  VITAL SIGNS: ED Triage Vitals  Enc Vitals Group     BP 04/28/15 0801 106/92 mmHg     Pulse Rate 04/28/15 0801 68     Resp --      Temp 04/28/15 0801 98.6 F (37 C)     Temp Source 04/28/15 0801 Oral     SpO2 04/28/15 0801 94 %     Weight 04/28/15 0801 241 lb (109.317 kg)     Height 04/28/15 0801 5\' 5"  (1.651 m)     Head Cir --      Peak Flow --      Pain Score --      Pain Loc --      Pain Edu? --      Excl. in [place]? --     Constitutional: Alert and oriented. Well appearing and in no acute distress. Eyes: Conjunctivae are normal. PERRL. EOMI. Head: Large hematoma on the back of her head with no abrasion or laceration. Nose: No congestion/rhinnorhea. Mouth/Throat: Mucous membranes are moist.  Oropharynx non-erythematous. Neck: No stridor.  No cervical spine tenderness to palpation.  Large chronic sebaceous cyst in the middle of her back in the upper thoracic spine region without evidence of infection. Cardiovascular: Normal rate, regular rhythm. Grossly normal heart sounds.  Good peripheral circulation. Respiratory: Normal respiratory effort.  No retractions. Lungs CTAB. Gastrointestinal: Soft and nontender. No distention. No abdominal bruits. No CVA tenderness. Musculoskeletal: No lower extremity tenderness nor edema.  No joint effusions.  Full nontender range of motion of all 4 of her extremities. Neurologic:  Normal speech and language. No gross focal neurologic deficits are appreciated. Speech is normal. No gait instability. Skin:  Skin is warm, dry and intact. No rash noted. Psychiatric: Mood and affect are normal. Speech and behavior are normal.  ____________________________________________   LABS (all labs ordered are listed, but only abnormal results are displayed)  Not indicated ____________________________________________  EKG  ED ECG REPORT #1   Date: 04/28/2015  EKG Time: 07:57   Rate: 125 (falsely elevated)  Rhythm: Heavy artifact is present, cannot accurately assess rhythm  Axis: Normal  Intervals:Unable to assess given artifact  ST&T Change: Unable to assess, repeating EKG  ED ECG REPORT #2   Date: 04/28/2015  EKG Time: 08:02  Rate: 54  Rhythm: sinus bradycardia  Axis: Normal  Intervals:Incomplete RBBB  ST&T Change: Non-specific ST segment / T-wave changes, but no evidence of acute ischemia.  ____________________________________________  RADIOLOGY  Ct Head Wo Contrast  04/28/2015   CLINICAL DATA:  Fall this morning, tripped over step at home, back of the head hematoma  EXAM: CT HEAD WITHOUT CONTRAST  CT CERVICAL SPINE WITHOUT CONTRAST  TECHNIQUE: Multidetector CT imaging of the head and cervical spine was performed following the standard protocol without intravenous contrast. Multiplanar CT image reconstructions of the cervical spine were also generated.  COMPARISON:  05/19/2014 and maxillofacial CT 02/02/2014  FINDINGS: CT HEAD FINDINGS  No skull fracture is noted. Mild scalp swelling and subcutaneous stranding midline posterior scalp see axial images 11 and 12. No intracranial hemorrhage, mass effect or midline shift. Atherosclerotic calcifications of carotid siphon. Mild cerebral atrophy.  No acute infarction. No mass lesion is noted on this  unenhanced scan.  CT CERVICAL SPINE FINDINGS  Axial images of the cervical spine shows no acute fracture or subluxation. Computer processed images shows no acute fracture or subluxation. There are degenerative changes C1-C2 articulation. There is bony fusion of C3-C4 vertebral body. Mild disc space flattening with anterior spurring at C4-C5-C5-C6 C6-C7 and C7-T1 level. No prevertebral soft tissue swelling. Cervical airway is patent. Spinal canal is patent. There is no pneumothorax in visualized lung apices.  IMPRESSION: 1. No acute intracranial abnormality. Mild cerebral atrophy. There is mild scalp swelling and subcutaneous  stranding midline posterior scalp see axial images 11 and 12. 2. No cervical spine acute fracture or subluxation. Bony fusion of C3-C4 vertebral bodies. Multilevel degenerative changes as described above.   Electronically Signed   By: Lahoma Crocker M.D.   On: 04/28/2015 09:05   Ct Cervical Spine Wo Contrast  04/28/2015   CLINICAL DATA:  Fall this morning, tripped over step at home, back of the head hematoma  EXAM: CT HEAD WITHOUT CONTRAST  CT CERVICAL SPINE WITHOUT CONTRAST  TECHNIQUE: Multidetector CT imaging of the head and cervical spine was performed following the standard protocol without intravenous contrast. Multiplanar CT image reconstructions of the cervical spine were also generated.  COMPARISON:  05/19/2014 and maxillofacial CT 02/02/2014  FINDINGS: CT HEAD FINDINGS  No skull fracture is noted. Mild scalp swelling and subcutaneous stranding midline posterior scalp see axial images 11 and 12. No intracranial hemorrhage, mass effect or midline shift. Atherosclerotic calcifications of carotid siphon. Mild cerebral atrophy.  No acute infarction. No mass lesion is noted on this unenhanced scan.  CT CERVICAL SPINE FINDINGS  Axial images of the cervical spine shows no acute fracture or subluxation. Computer processed images shows no acute fracture or subluxation. There are degenerative changes C1-C2 articulation. There is bony fusion of C3-C4 vertebral body. Mild disc space flattening with anterior spurring at C4-C5-C5-C6 C6-C7 and C7-T1 level. No prevertebral soft tissue swelling. Cervical airway is patent. Spinal canal is patent. There is no pneumothorax in visualized lung apices.  IMPRESSION: 1. No acute intracranial abnormality. Mild cerebral atrophy. There is mild scalp swelling and subcutaneous stranding midline posterior scalp see axial images 11 and 12. 2. No cervical spine acute fracture or subluxation. Bony fusion of C3-C4 vertebral bodies. Multilevel degenerative changes as described above.    Electronically Signed   By: Lahoma Crocker M.D.   On: 04/28/2015 09:05    ____________________________________________   PROCEDURES  Procedure(s) performed: None  Critical Care performed: No  ____________________________________________   INITIAL IMPRESSION / ASSESSMENT AND PLAN / ED COURSE  Pertinent labs & imaging results that were available during my care of the patient were reviewed by me and considered in my medical decision making (see chart for details).  Well-appearing in spite of her fall.  She is comfortable and watching TV in no acute distress.  Her CT scans do not reveal any serious acute injury.  I gave her my usual customary return precautions.  She is comfortable with the plan for discharge. ____________________________________________   FINAL CLINICAL IMPRESSION(S) / ED DIAGNOSES  Final diagnoses:  Head contusion, initial encounter  Muscle strain     Hinda Kehr, MD 04/28/15 [phone]

## 2015-05-10 IMAGING — CR DG CHEST 1V PORT
1 series · 1 of 1 positions shown · non-contrast
Comparison: none

REASON FOR EXAM: sob
COMMENTS:

[ap]
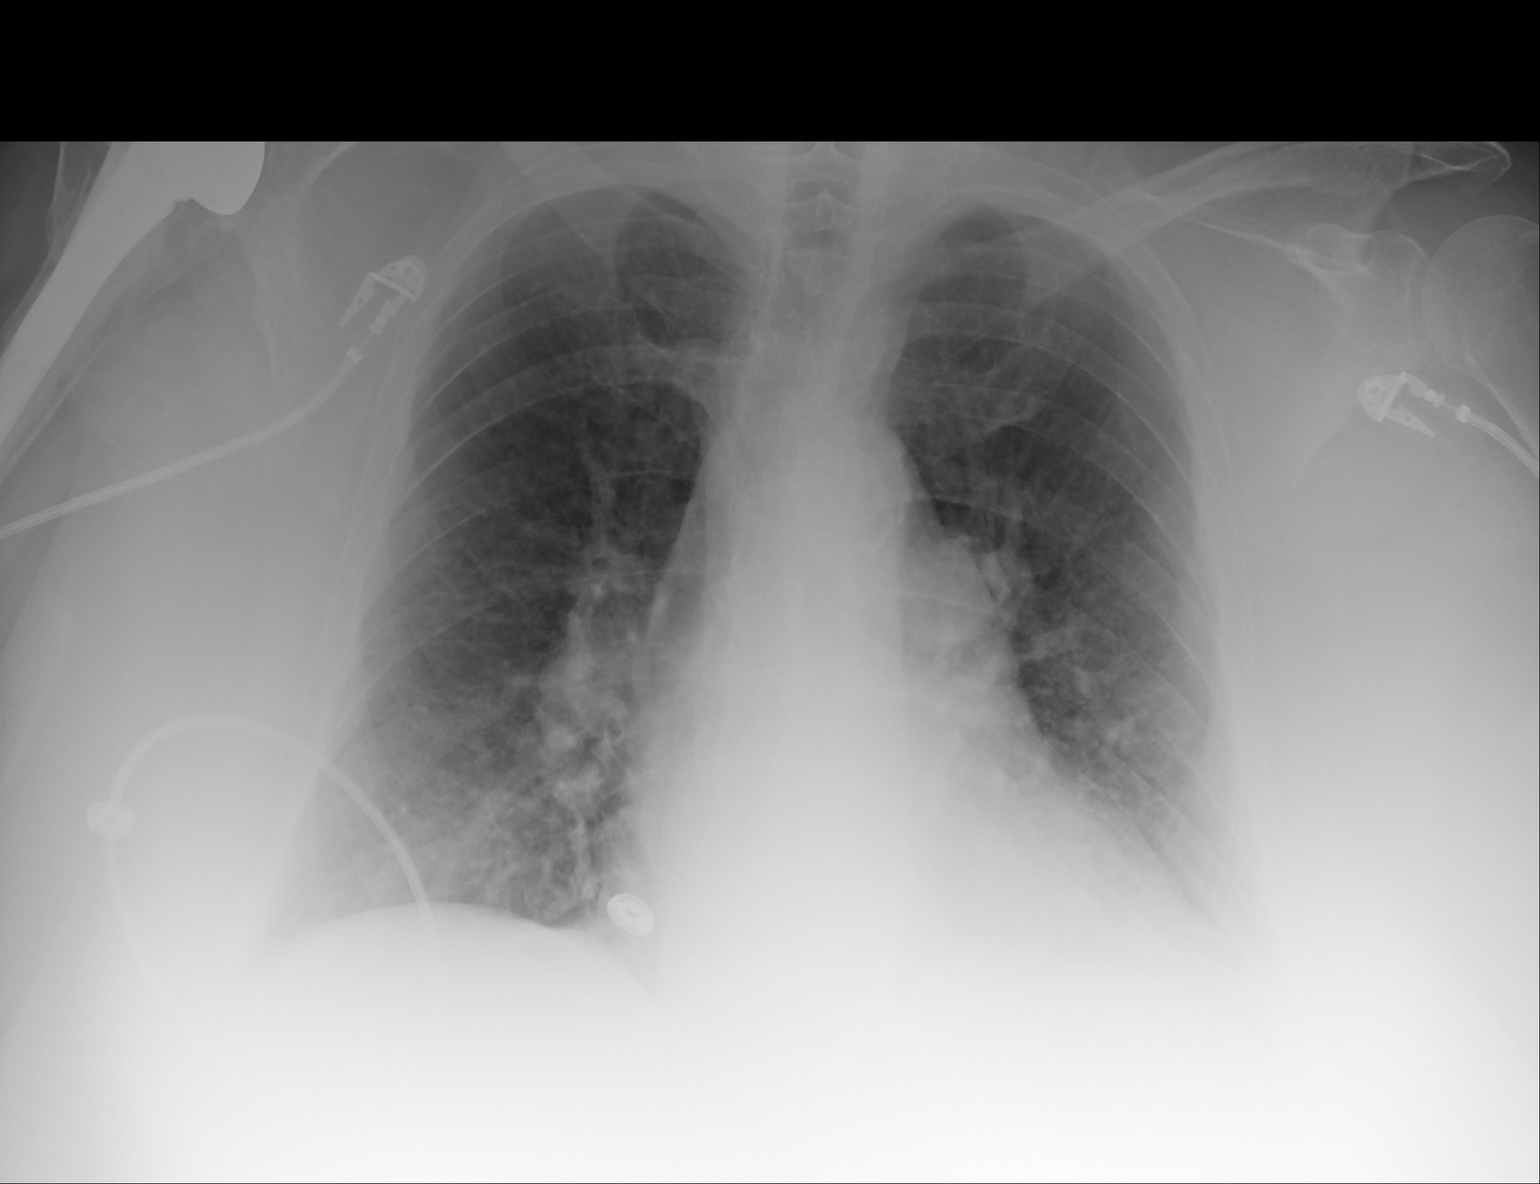

[1 of 1 positions shown; findings below may reference images not displayed]

PROCEDURE:     DXR - DXR PORTABLE CHEST SINGLE VIEW  - September 30, 2013  [DATE]

RESULT:     Comparison is made to the study of 07/27/2013.

The lungs are clear. The heart and pulmonary vessels are normal. The bony
and mediastinal structures are unremarkable. There is no effusion. There is
no pneumothorax or evidence of congestive failure. There is hyperinflation
suggestive of COPD. Followup PA and lateral views of the chest are
recommended.
IMPRESSION: No acute cardiopulmonary disease.

[REDACTED]

## 2015-05-11 IMAGING — CR DG CHEST 2V
1 series · 2 of 2 positions shown · non-contrast
Comparison: none

REASON FOR EXAM: SOB
COMMENTS:

[Series 1: w chest pa · 0.14mm/px · 2 of 2 slices shown]
[im 1/2]
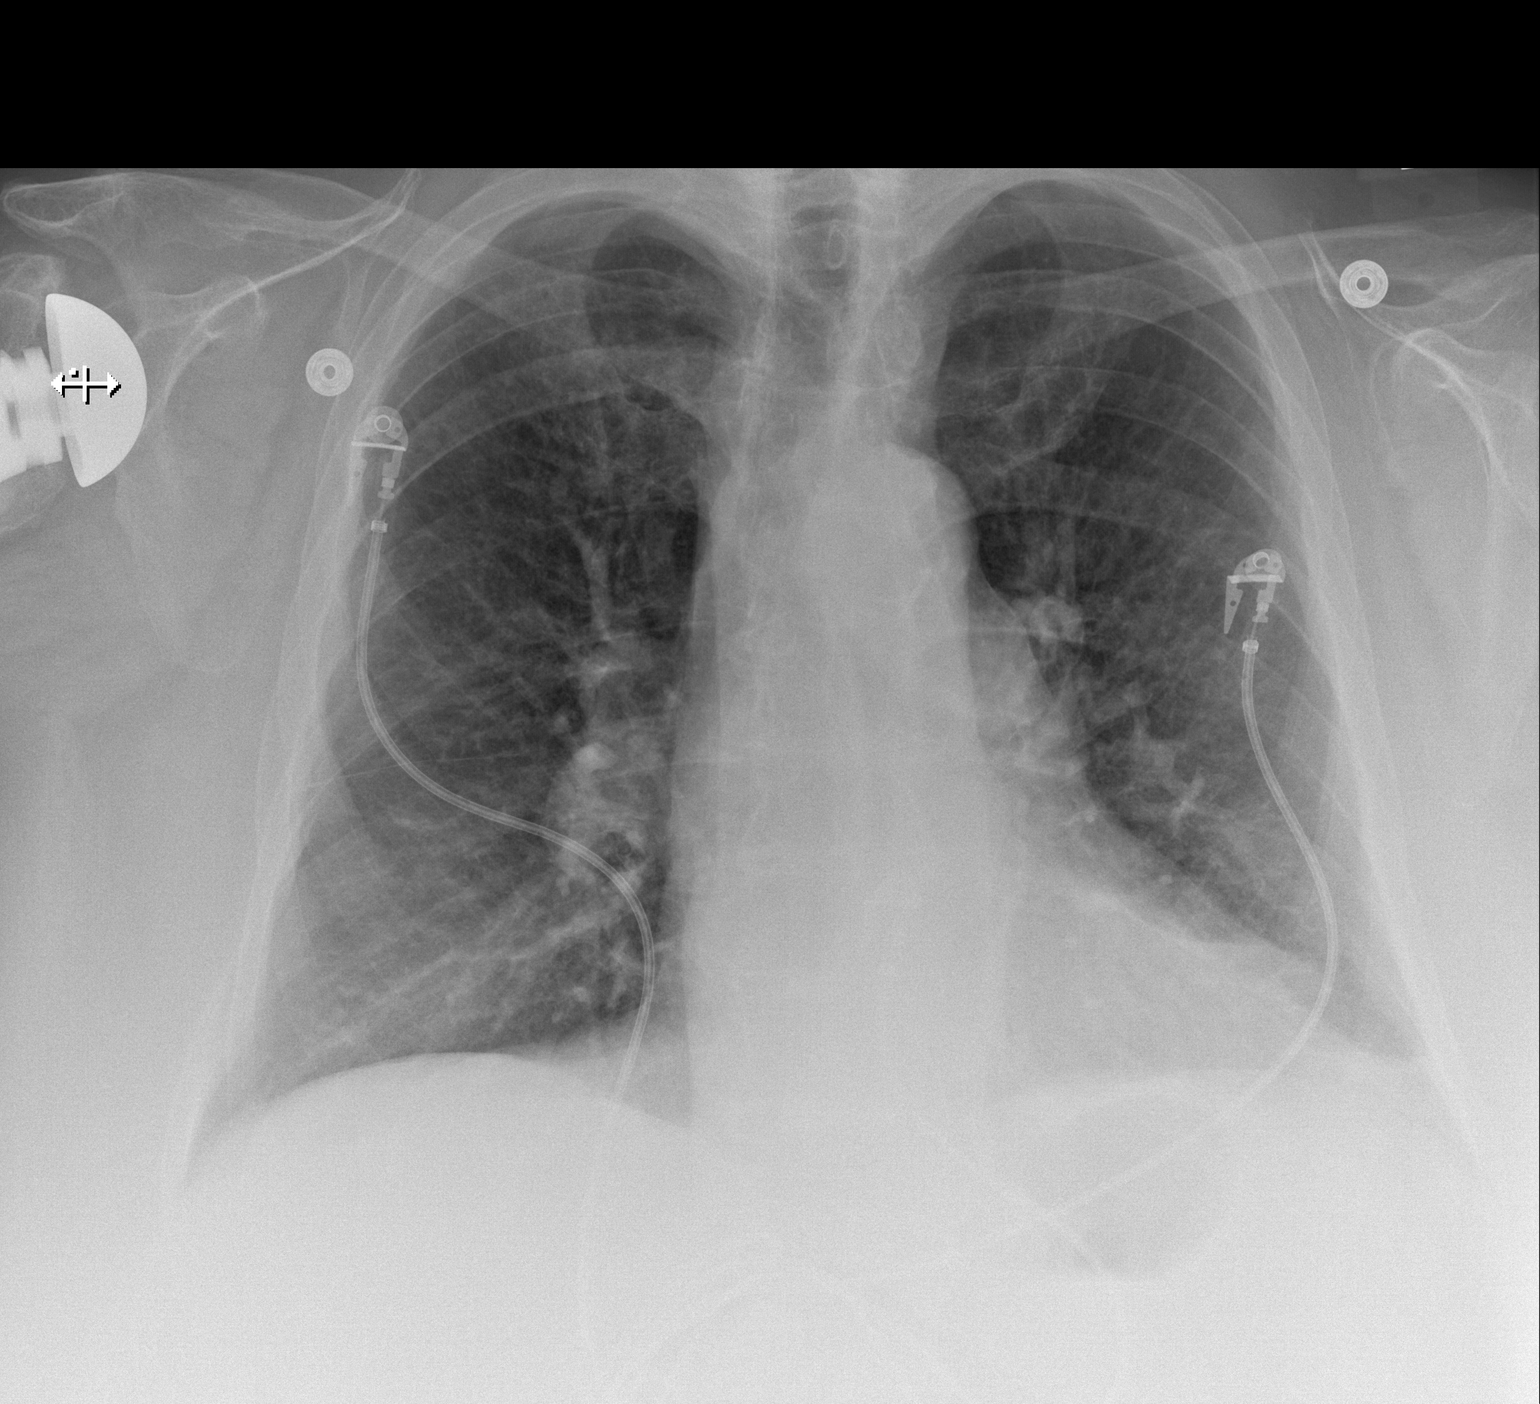
[im 2/2]
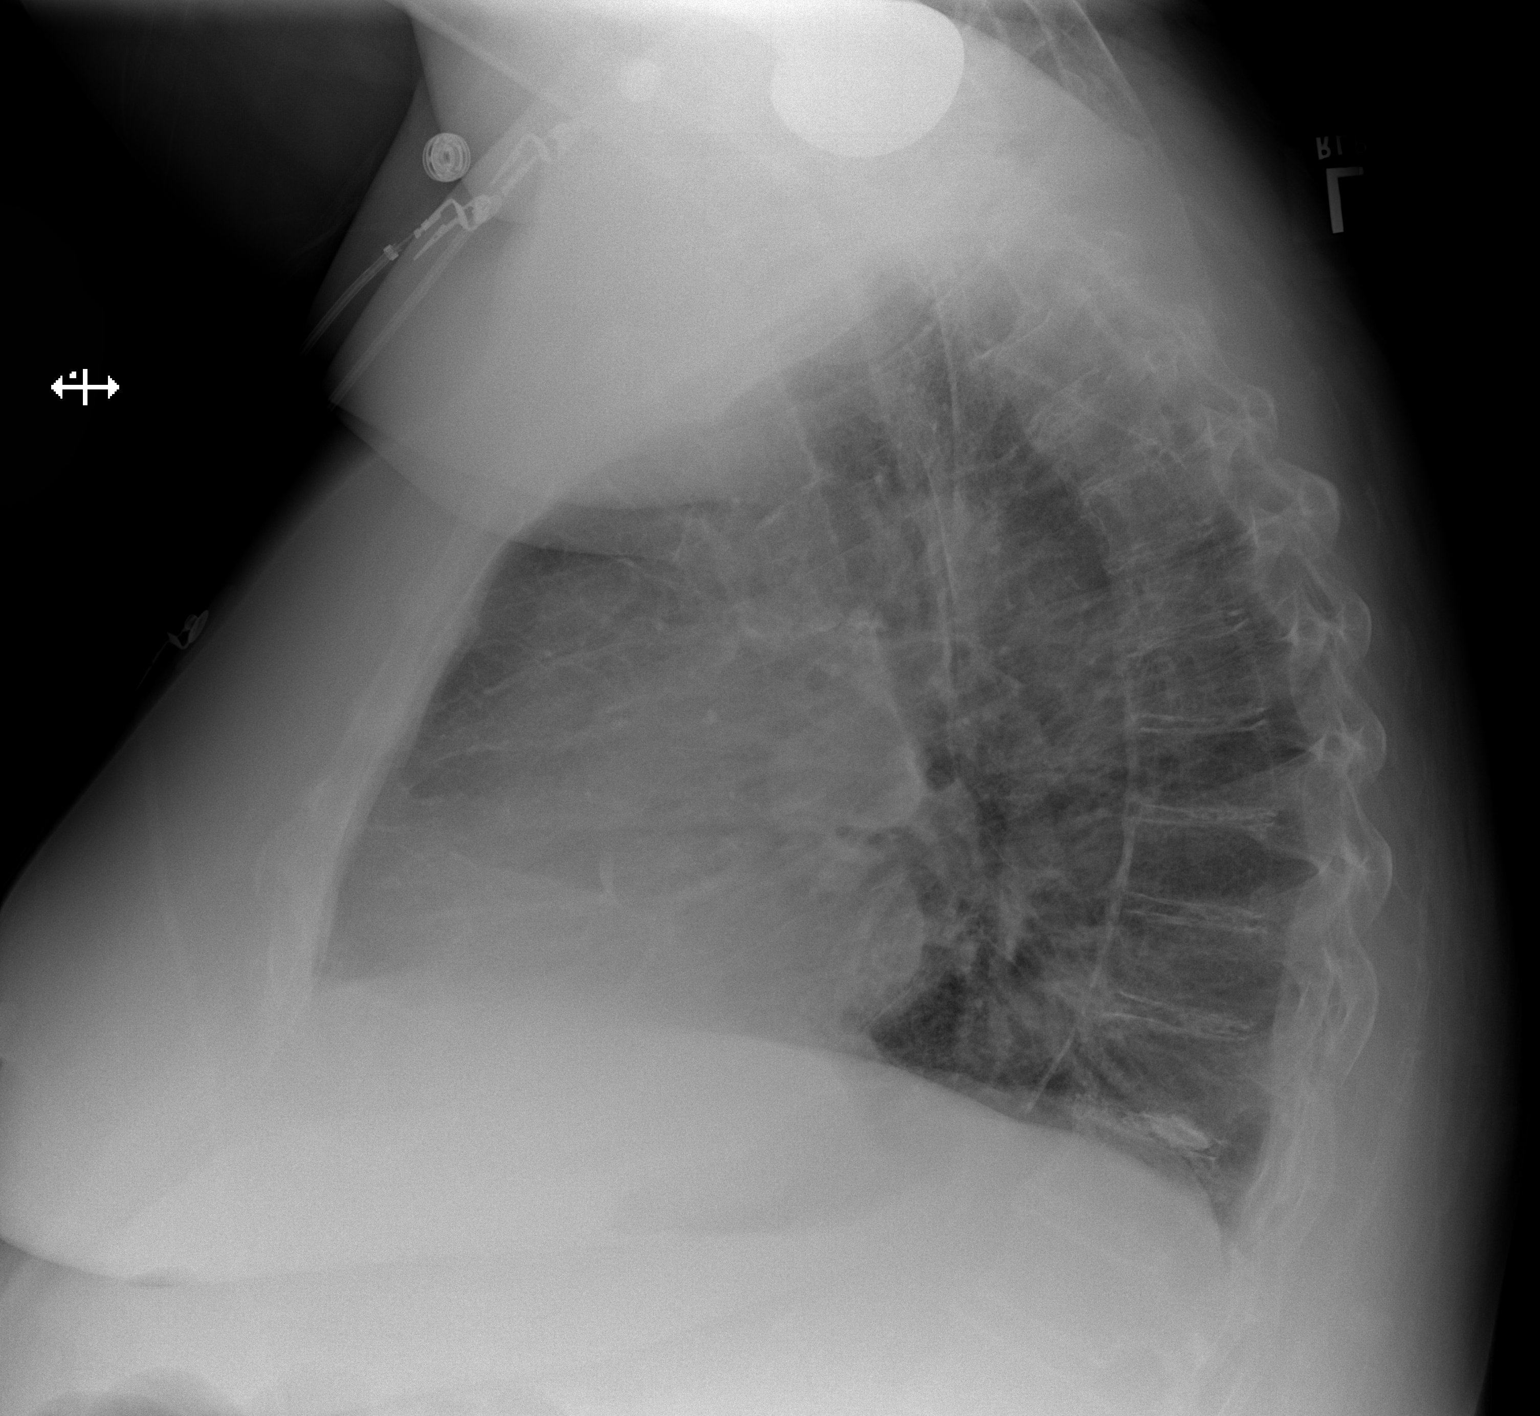

[2 of 2 positions shown; findings below may reference images not displayed]

PROCEDURE:     DXR - DXR CHEST PA (OR AP) AND LATERAL  - October 01, 2013  [DATE]

RESULT:     Comparison is made to the study September 30, 2013.

The lungs are mildly hyperinflated. There may be subsegmental atelectasis at
the left lung base. The cardiac silhouette is top normal in size. There is
mild tortuosity of the descending thoracic aorta. The perihilar interstitial
markings are mildly increased. There is mild degenerative disc change at
multiple levels of the thoracic spine.
IMPRESSION: The findings are consistent with COPD. There may be
superimposed acute bronchitis with subsegmental atelectasis. There is no
focal pneumonia nor definite evidence of CHF.

[REDACTED]

## 2015-05-13 IMAGING — CR DG OUTSIDE FILMS CHEST
1 series · 2 of 2 positions shown · non-contrast
Comparison: none

[Series 3: w chest pa · 0.14mm/px · 2 of 2 slices shown]
[im 1/2]
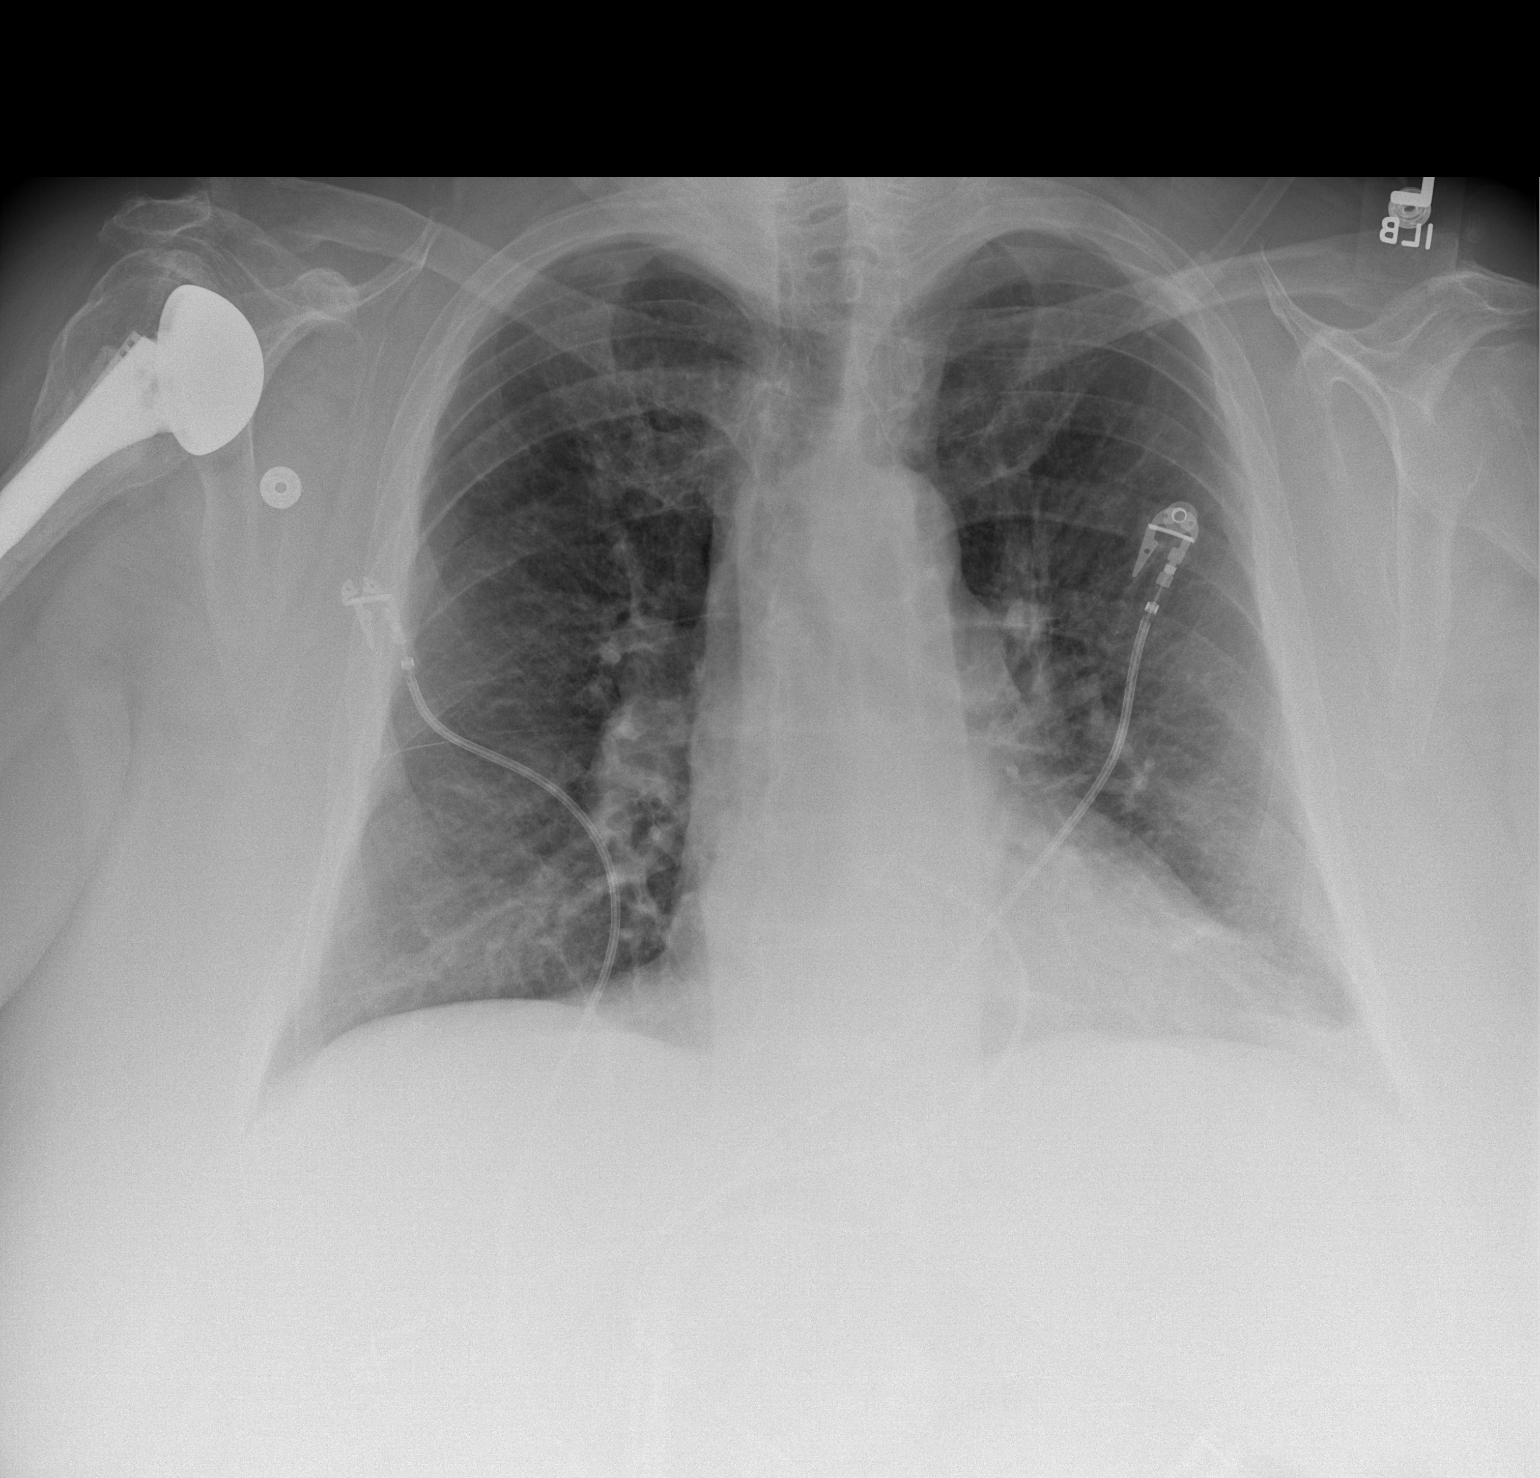
[im 2/2]
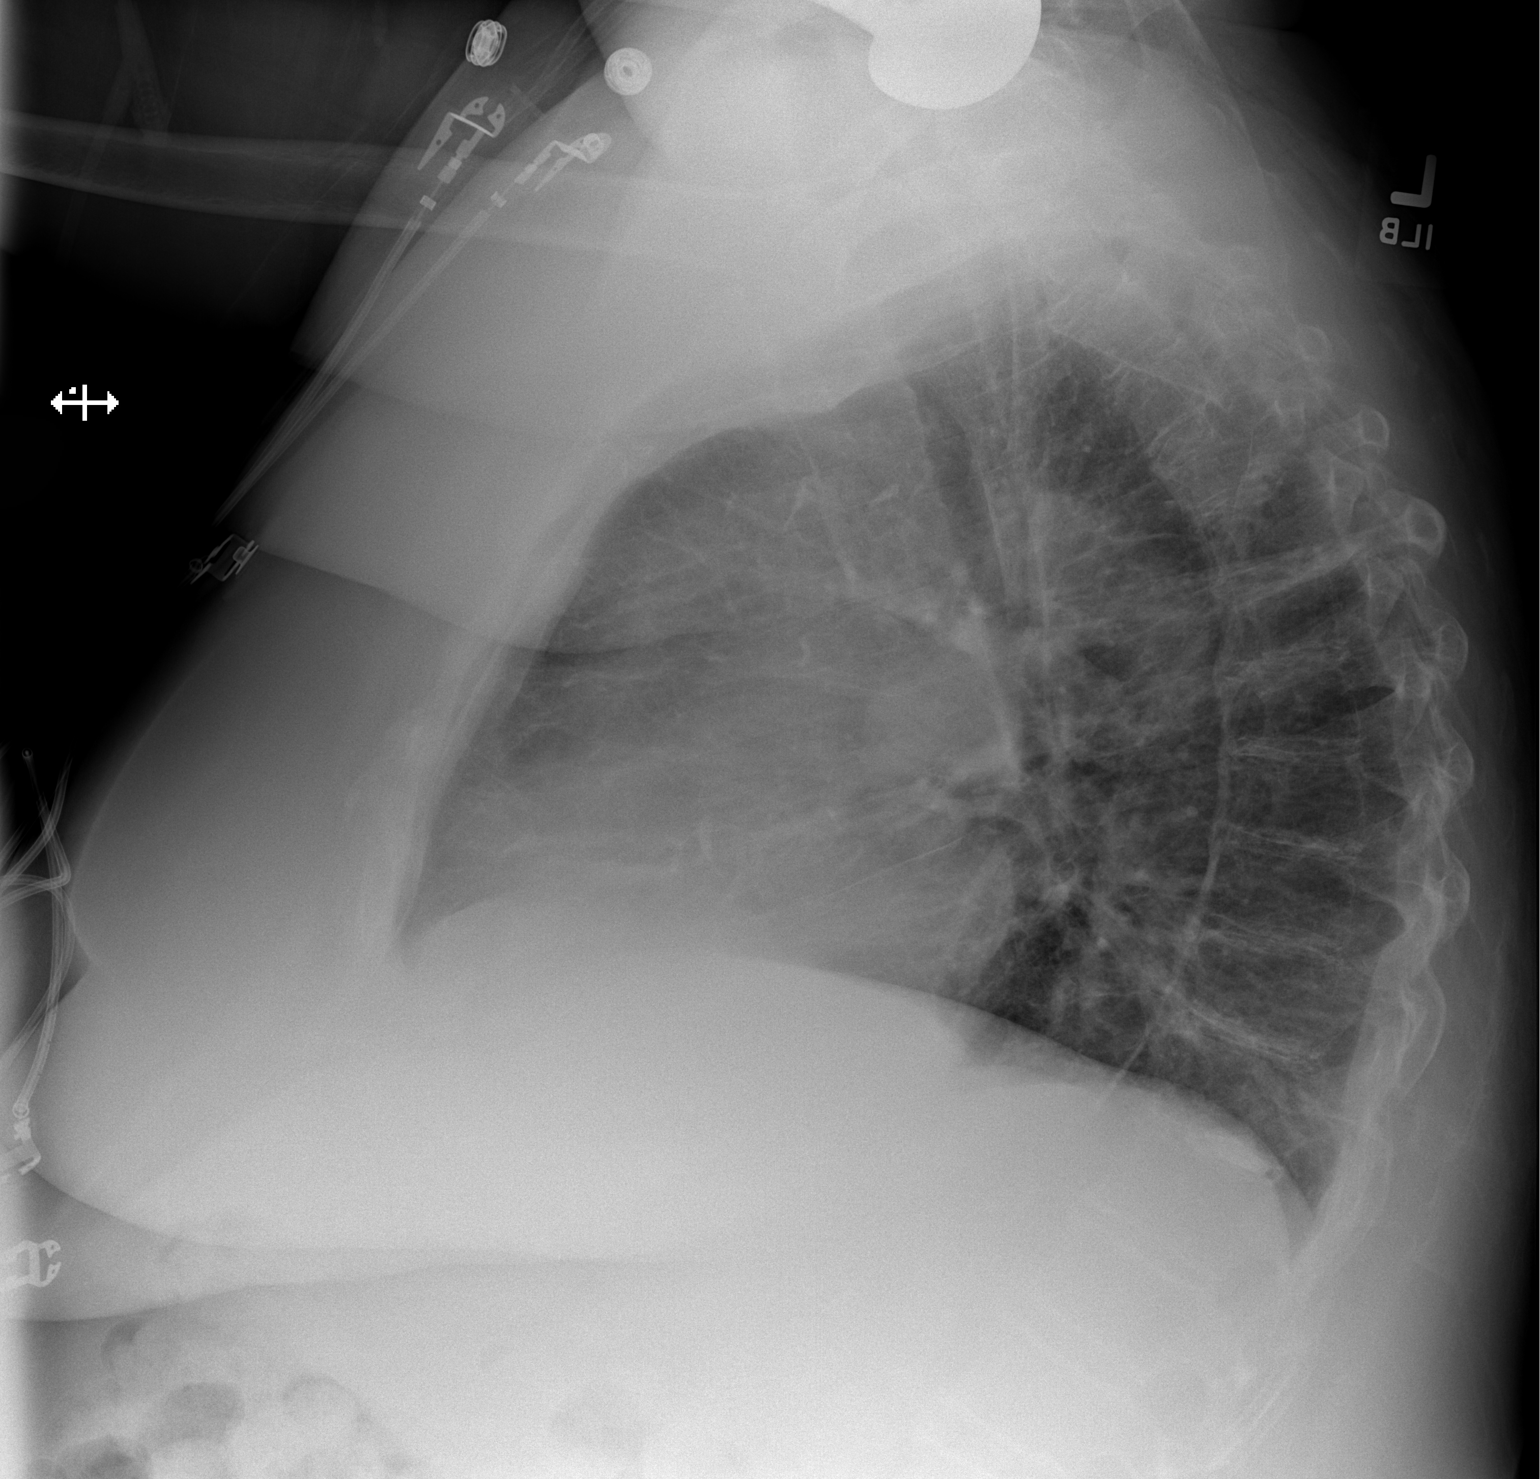

[2 of 2 positions shown; findings below may reference images not displayed]

Canned report from images found in remote index.

Refer to host system for actual result text.

## 2015-05-21 IMAGING — CR DG CHEST 2V
1 series · 2 of 2 positions shown · non-contrast
Comparison: none

REASON FOR EXAM: weak, lo bp
COMMENTS:

PROCEDURE:     DXR - DXR CHEST PA (OR AP) AND LATERAL  - October 11, 2013  [DATE]
RESULT:     The lungs are clear. The cardiac silhouette and visualized bony
skeleton are unremarkable.

[Series 5: w chest pa · 0.14mm/px · 2 of 2 slices shown]
[im 1/2]
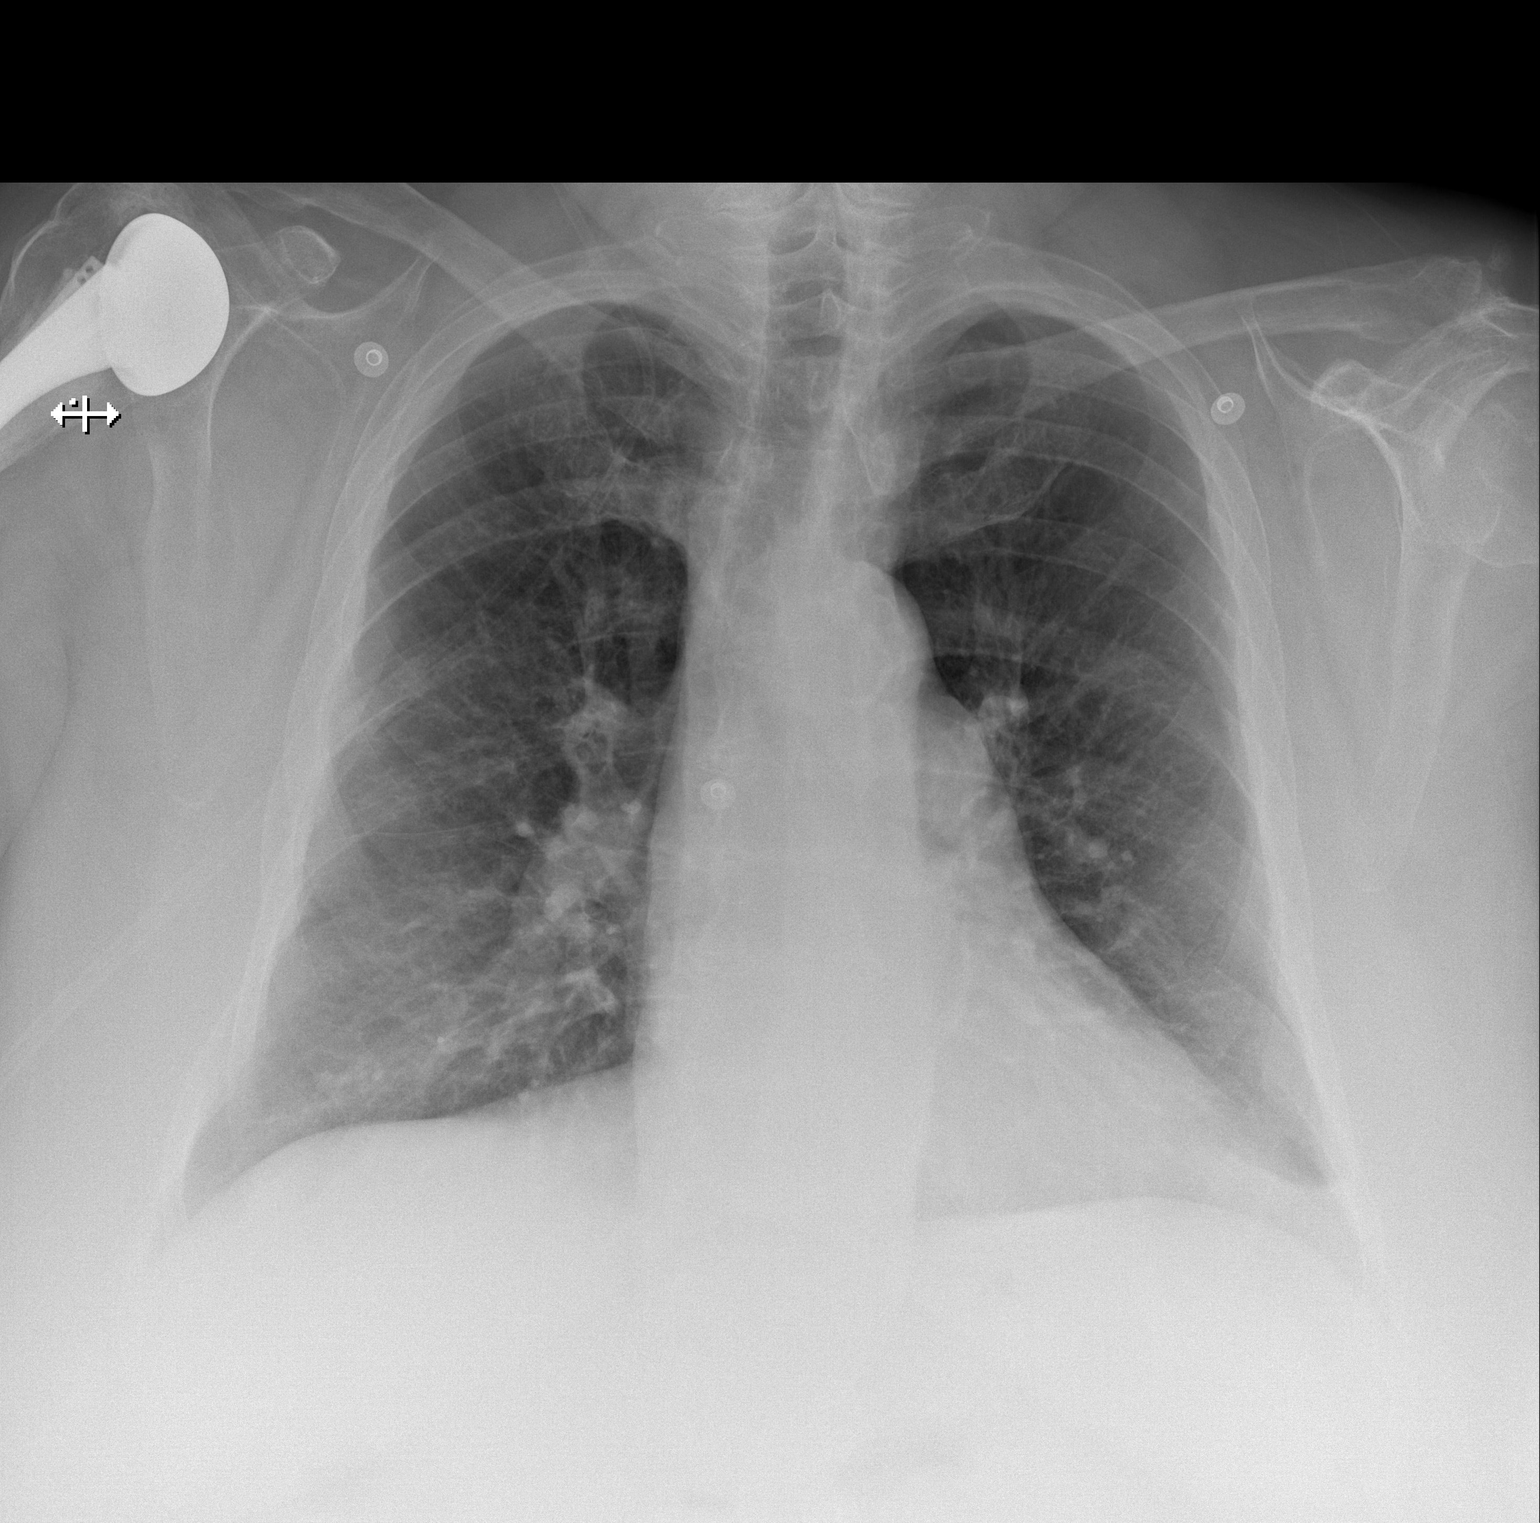
[im 2/2]
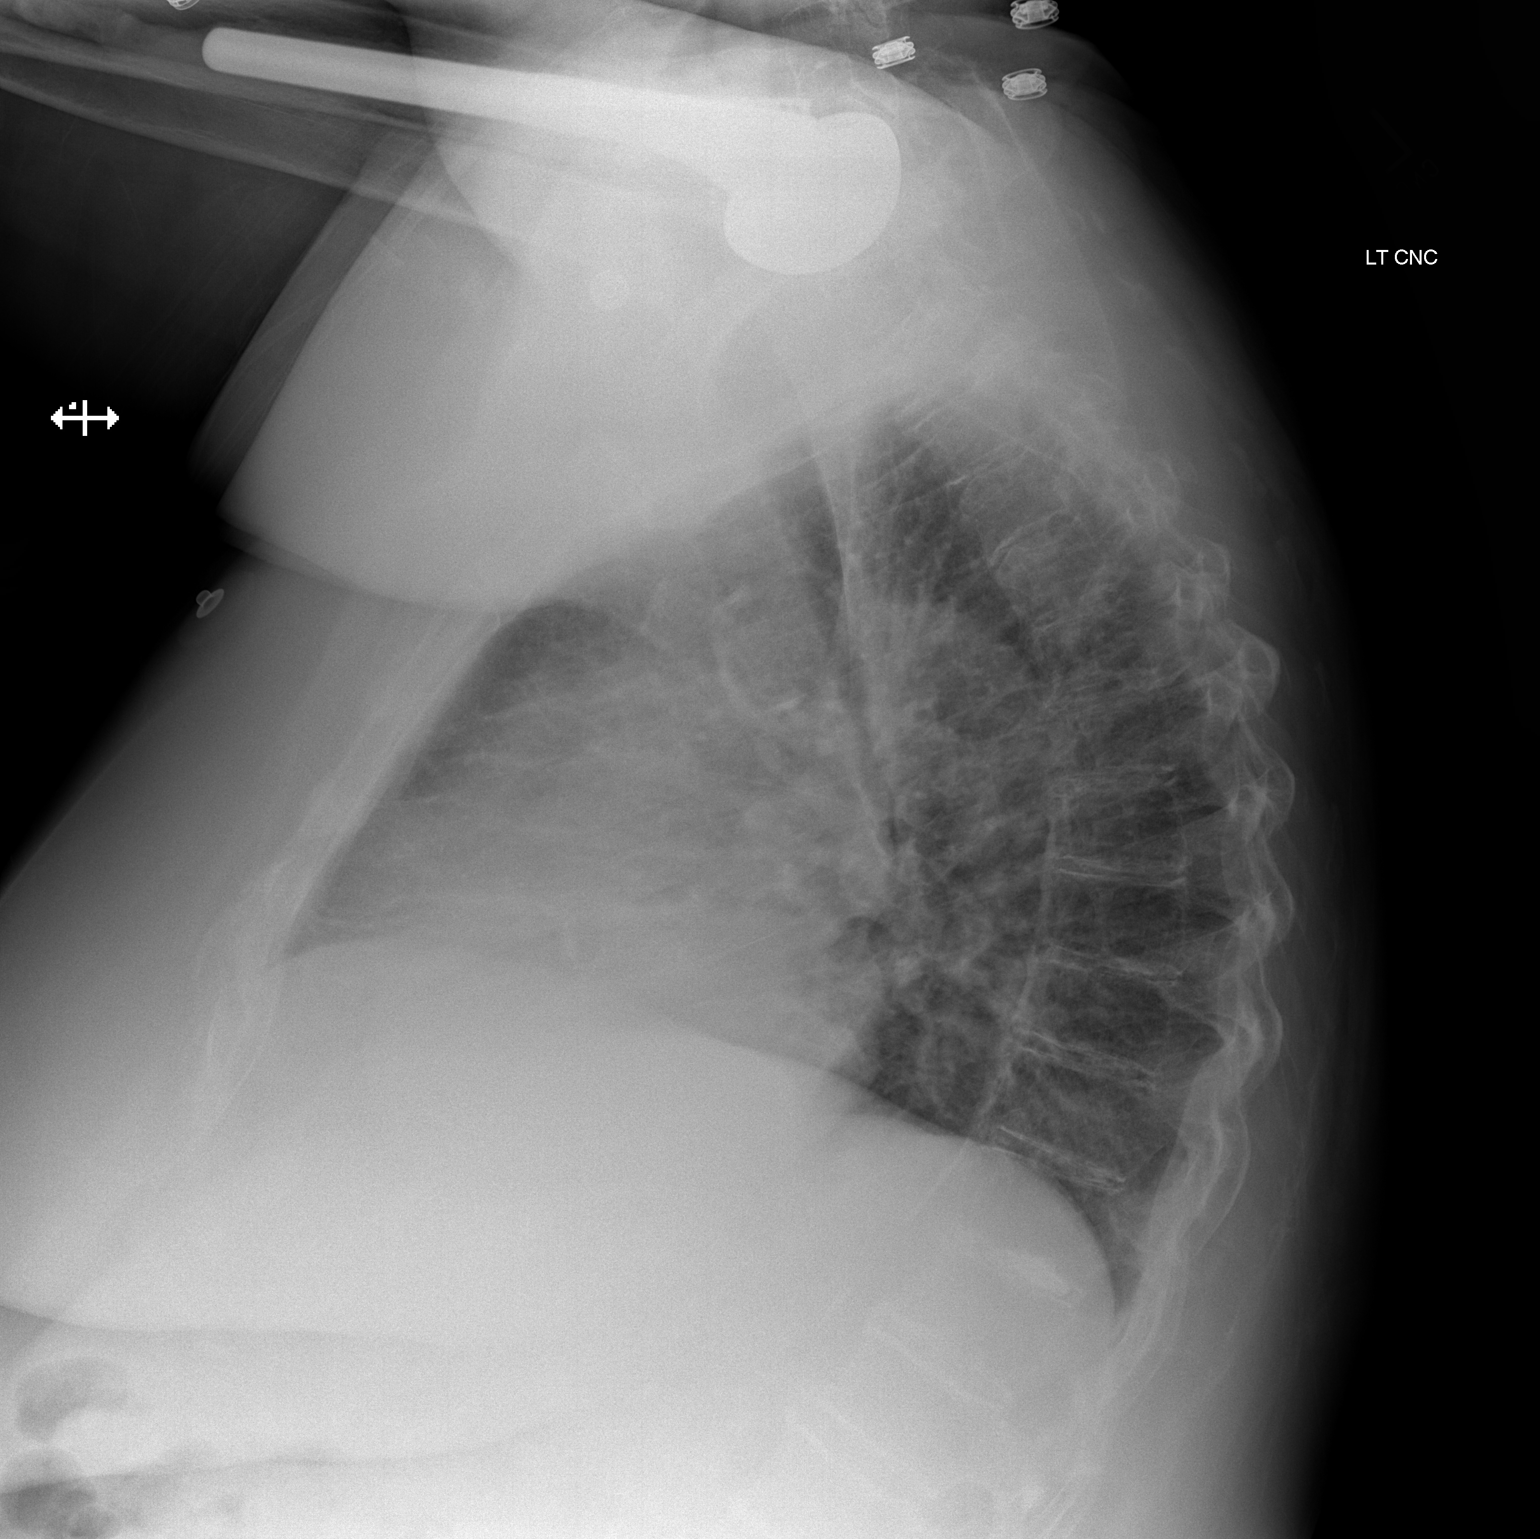

[2 of 2 positions shown; findings below may reference images not displayed]

IMPRESSION: 1. Chest radiograph without evidence of acute cardiopulmonary disease.
2. Comparison made to prior 10/03/2013

## 2015-05-21 IMAGING — CT CT ABD-PELV W/O CM
1 of 2 series · 15 of 32 positions shown, 19 images · non-contrast
Comparison: none

REASON FOR EXAM: (1) severe abd and diarrhea lo bp; (2) abd pain
COMMENTS:

[Series 2: 3mm soft tissue · axial · 0.98mm/px · z∈[-665,-251]mm · 15 of 152 slices shown, 19 images]
[im 7/152  soft-tissue]
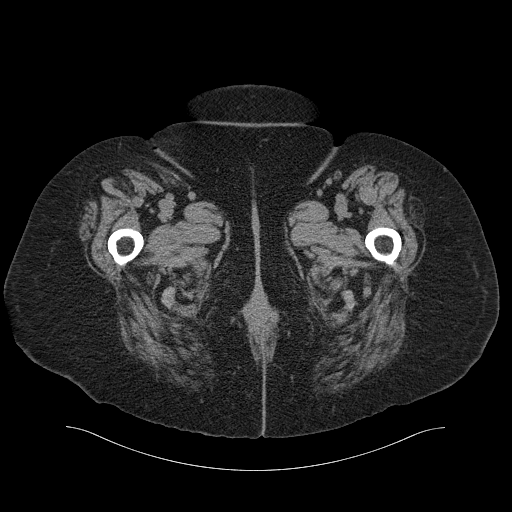
[im 7/152  bone]
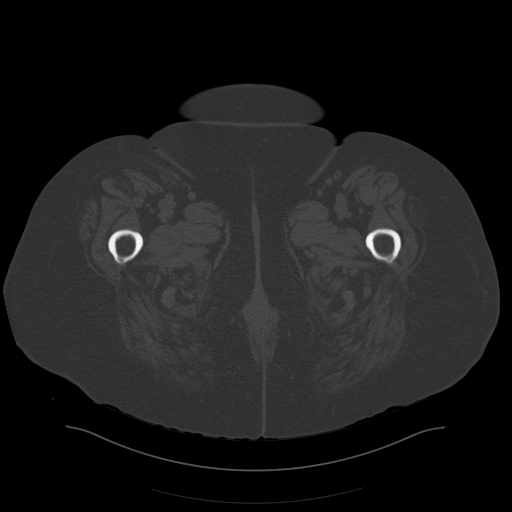
[im 19/152  soft-tissue]
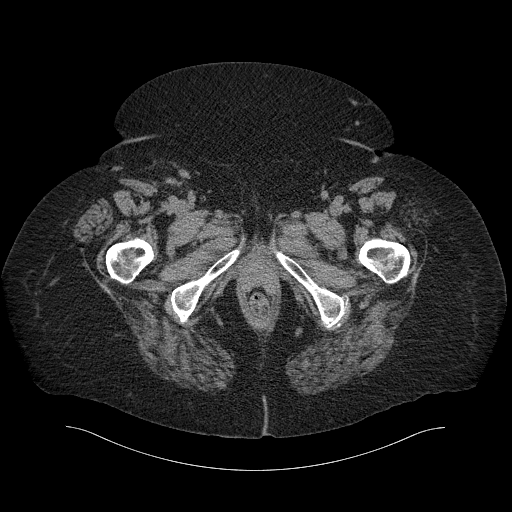
[im 32/152  soft-tissue]
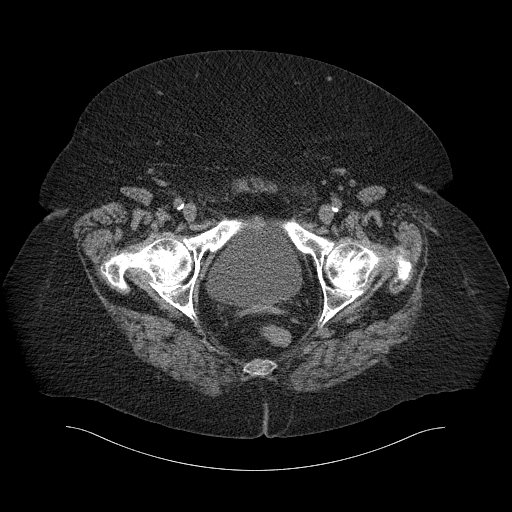
[im 45/152  soft-tissue]
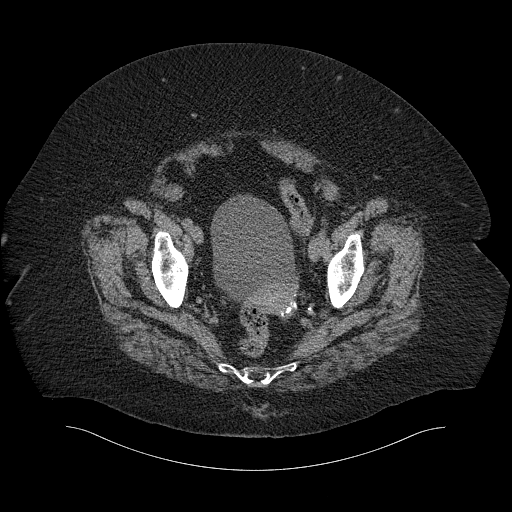
[im 51/152  soft-tissue]
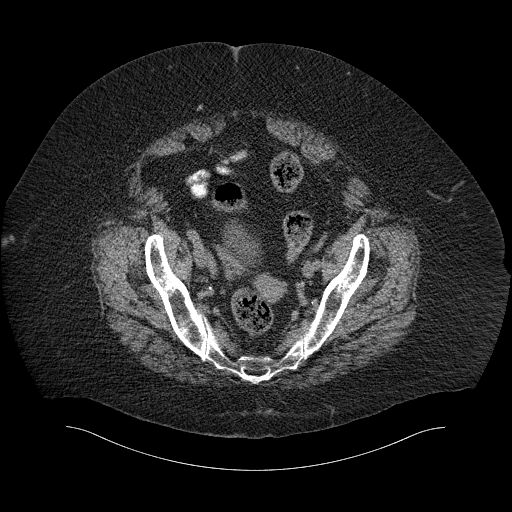
[im 63/152  soft-tissue]
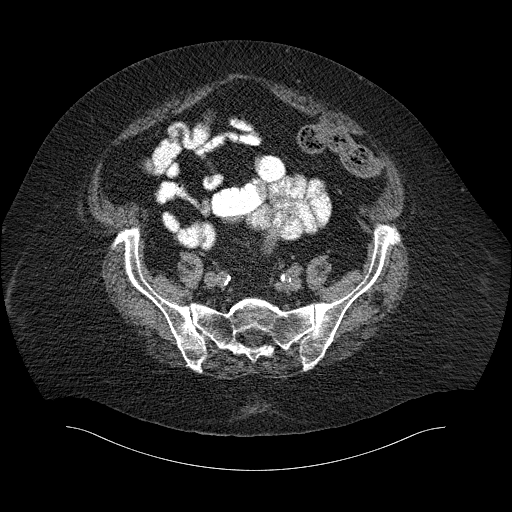
[im 76/152  soft-tissue]
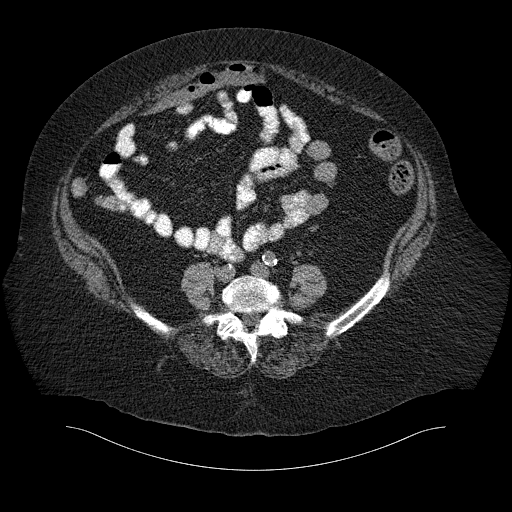
[im 89/152  soft-tissue]
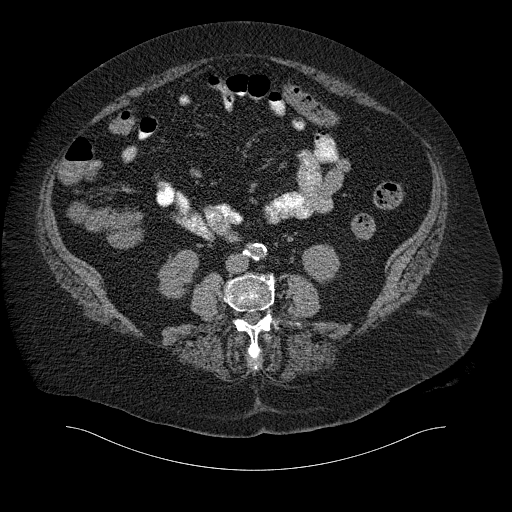
[im 101/152  soft-tissue]
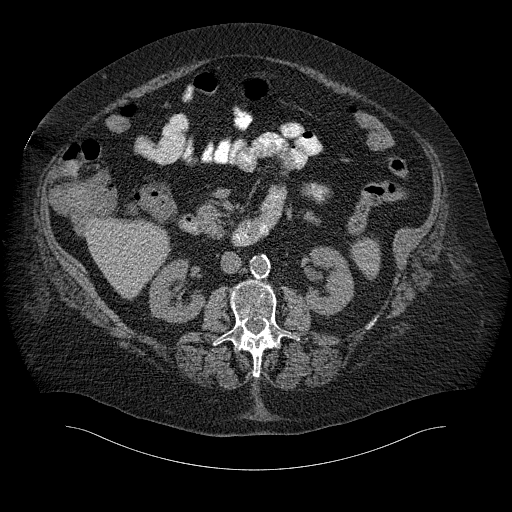
[im 101/152  bone]
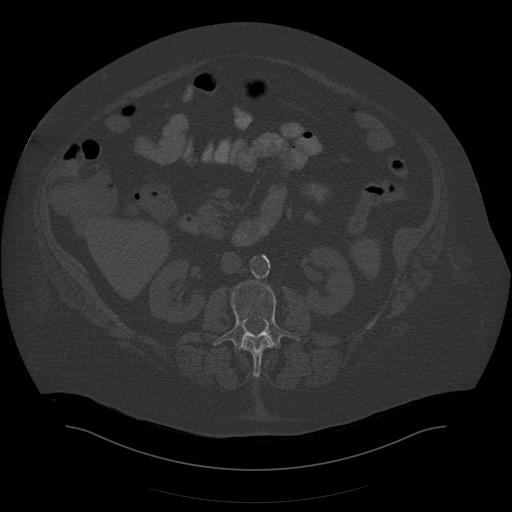
[im 107/152  soft-tissue]
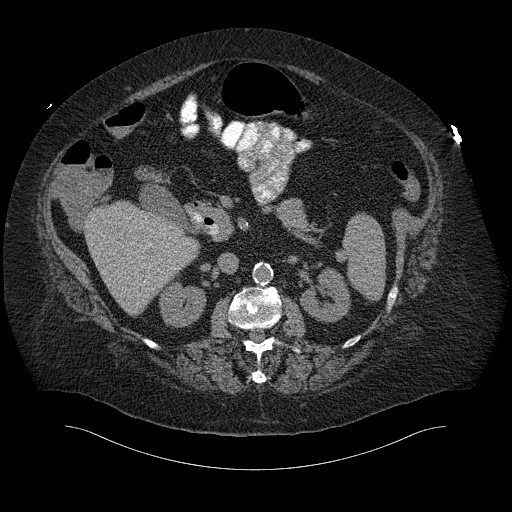
[im 120/152  soft-tissue]
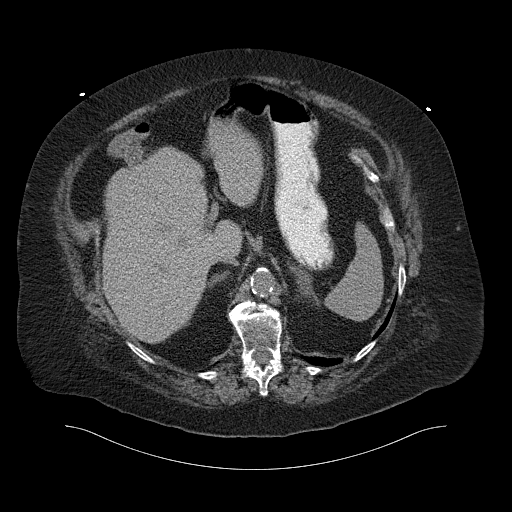
[im 126/152  lung]
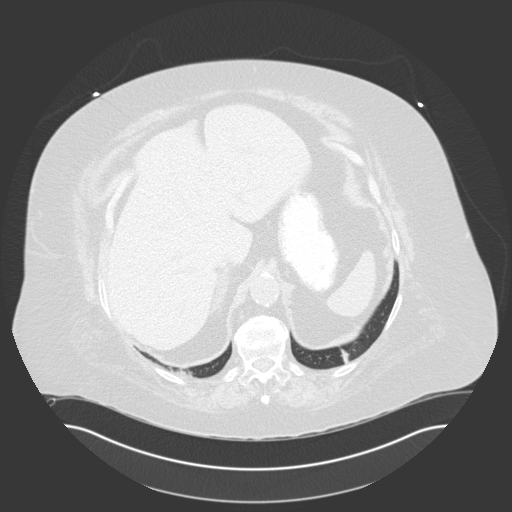
[im 133/152  soft-tissue]
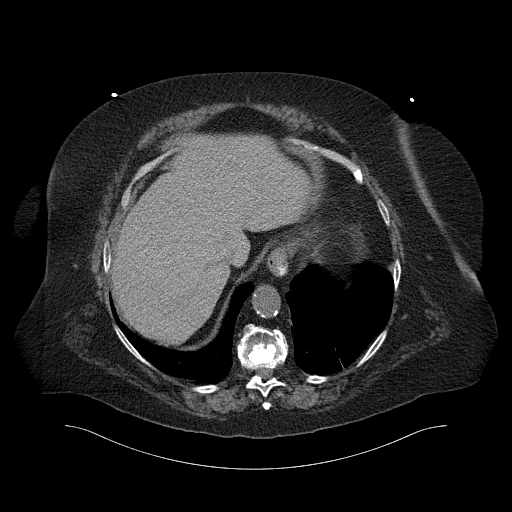
[im 133/152  lung]
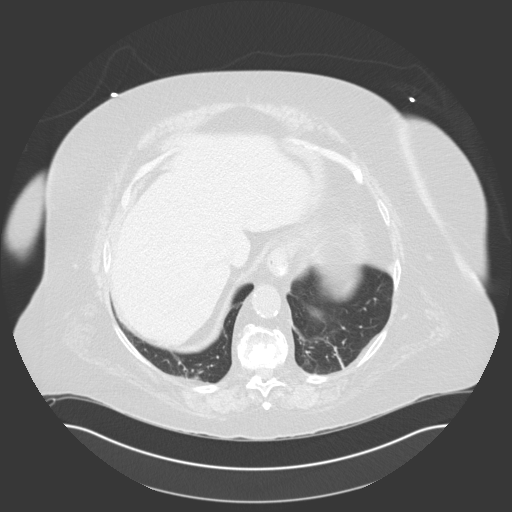
[im 139/152  lung]
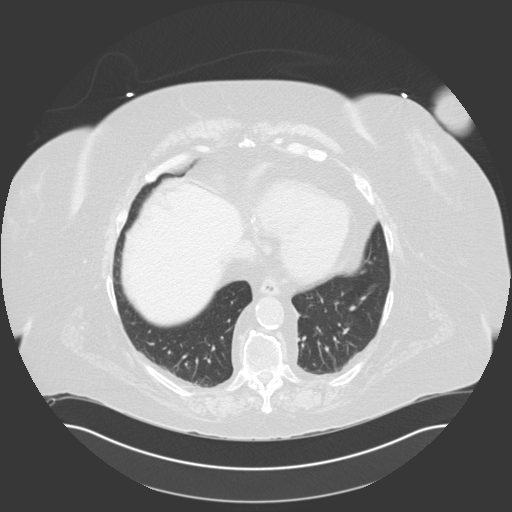
[im 145/152  soft-tissue]
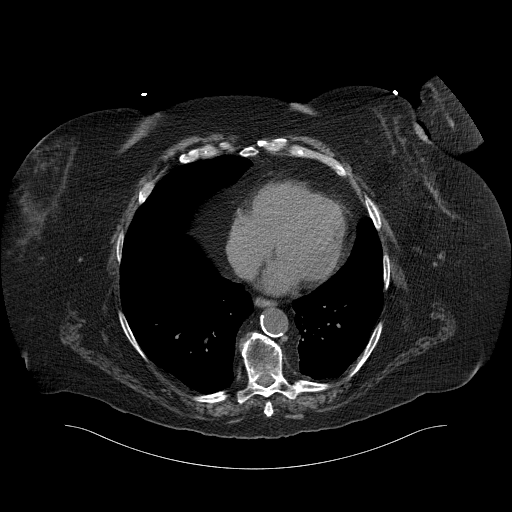
[im 145/152  lung]
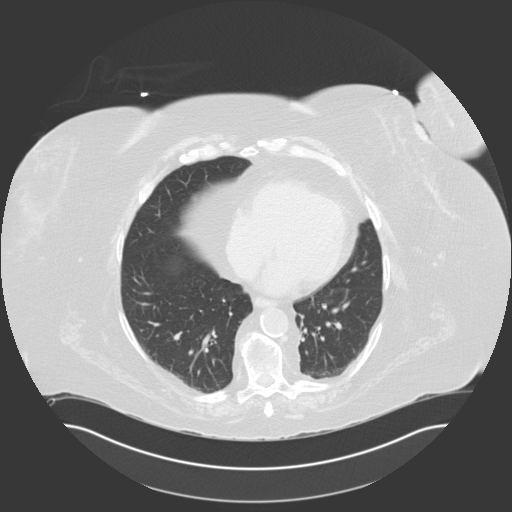

[15 of 32 positions shown; findings below may reference images not displayed]

PROCEDURE:     CT  - CT ABDOMEN AND PELVIS W[DATE]  [DATE]

RESULT:     CT of the abdomen and pelvis is performed with oral contrast
only with images reconstructed at 3.0 mm slice thickness in the axial plane.
Images demonstrate no evidence of abnormal bowel distention to suggest
obstruction. The urinary bladder is unremarkable. An atrophic uterus is
present. There is air and fecal material in the colon to the rectum. No
radiopaque gallstones are evident. Mild adrenal fullness is seen bilaterally
slightly greater on the left. Hyperplasia or minimal nodularity is not
excluded. The kidneys show no nephrolithiasis or hydronephrosis. The liver
and spleen appear unremarkable for noncontrast exam. The pancreas shows no
discrete mass or surrounding inflammation. No ductal dilation is evident.
The stomach is unremarkable. Atherosclerotic calcification is seen in the
aorta without aneurysm. There is no adenopathy. The bony structures appear
to be within normal limits. The lung bases appear clear.
IMPRESSION: 1. No acute abnormality of the abdomen or pelvis.

[REDACTED]

## 2015-06-05 ENCOUNTER — Emergency Department: Payer: Medicare (Managed Care)

## 2015-06-05 ENCOUNTER — Emergency Department
Admission: EM | Admit: 2015-06-05 | Discharge: 2015-06-05 | Disposition: A | Discharge status: Home / Self Care | Payer: Medicare (Managed Care) | Attending: Emergency Medicine | Admitting: Emergency Medicine

## 2015-06-05 ENCOUNTER — Encounter: Payer: Self-pay | Admitting: Emergency Medicine

## 2015-06-05 DIAGNOSIS — N189 Chronic kidney disease, unspecified: Secondary | ICD-10-CM | POA: Diagnosis not present

## 2015-06-05 DIAGNOSIS — Z87891 Personal history of nicotine dependence: Secondary | ICD-10-CM | POA: Insufficient documentation

## 2015-06-05 DIAGNOSIS — Y9389 Activity, other specified: Secondary | ICD-10-CM | POA: Insufficient documentation

## 2015-06-05 DIAGNOSIS — IMO0002 Reserved for concepts with insufficient information to code with codable children: Secondary | ICD-10-CM

## 2015-06-05 DIAGNOSIS — Y998 Other external cause status: Secondary | ICD-10-CM | POA: Insufficient documentation

## 2015-06-05 DIAGNOSIS — Y9289 Other specified places as the place of occurrence of the external cause: Secondary | ICD-10-CM | POA: Diagnosis not present

## 2015-06-05 DIAGNOSIS — I129 Hypertensive chronic kidney disease with stage 1 through stage 4 chronic kidney disease, or unspecified chronic kidney disease: Secondary | ICD-10-CM | POA: Insufficient documentation

## 2015-06-05 DIAGNOSIS — S4992XA Unspecified injury of left shoulder and upper arm, initial encounter: Secondary | ICD-10-CM | POA: Diagnosis present

## 2015-06-05 DIAGNOSIS — S42202A Unspecified fracture of upper end of left humerus, initial encounter for closed fracture: Secondary | ICD-10-CM | POA: Diagnosis not present

## 2015-06-05 DIAGNOSIS — E119 Type 2 diabetes mellitus without complications: Secondary | ICD-10-CM | POA: Insufficient documentation

## 2015-06-05 DIAGNOSIS — W010XXA Fall on same level from slipping, tripping and stumbling without subsequent striking against object, initial encounter: Secondary | ICD-10-CM | POA: Diagnosis not present

## 2015-06-05 DIAGNOSIS — S81012A Laceration without foreign body, left knee, initial encounter: Secondary | ICD-10-CM | POA: Diagnosis not present

## 2015-06-05 DIAGNOSIS — S42292A Other displaced fracture of upper end of left humerus, initial encounter for closed fracture: Secondary | ICD-10-CM

## 2015-06-05 MED ORDER — HYDROMORPHONE HCL 1 MG/ML IJ SOLN
1.0000 mg | Freq: Once | INTRAMUSCULAR | Status: AC
  Administered 2015-06-05: 1 mg via INTRAVENOUS

## 2015-06-05 MED ORDER — ONDANSETRON HCL 4 MG/2ML IJ SOLN
INTRAMUSCULAR | Status: AC
  Administered 2015-06-05: 4 mg via INTRAVENOUS
  Filled 2015-06-05: qty 2

## 2015-06-05 MED ORDER — MORPHINE SULFATE 4 MG/ML IJ SOLN
4.0000 mg | Freq: Once | INTRAMUSCULAR | Status: AC
  Administered 2015-06-05: 4 mg via INTRAVENOUS

## 2015-06-05 MED ORDER — HYDROMORPHONE HCL 1 MG/ML IJ SOLN
INTRAMUSCULAR | Status: AC
  Administered 2015-06-05: 1 mg via INTRAVENOUS
  Filled 2015-06-05: qty 1

## 2015-06-05 MED ORDER — ONDANSETRON HCL 4 MG/2ML IJ SOLN
4.0000 mg | Freq: Once | INTRAMUSCULAR | Status: AC
  Administered 2015-06-05: 4 mg via INTRAVENOUS

## 2015-06-05 MED ORDER — OXYCODONE-ACETAMINOPHEN 5-325 MG PO TABS: 1.0000 | ORAL_TABLET | Freq: Four times a day (QID) | ORAL | Status: DC | PRN

## 2015-06-05 MED ORDER — MORPHINE SULFATE 4 MG/ML IJ SOLN
INTRAMUSCULAR | Status: AC
  Administered 2015-06-05: 4 mg via INTRAVENOUS
  Filled 2015-06-05: qty 1

## 2015-06-05 NOTE — ED Notes (Signed)
BIB [facility] from home s/p fall. Pt reports she tripped over the vacuum cleaner cord. Pt denies hitting her head, no LOC. Pt fell onto her L side, feels like L shoulder is dislocated, also R knee skin tear noted. Per EMS pt VSS.

## 2015-06-05 NOTE — ED Notes (Signed)
Dr Jimmye Norman notified of ongoing pain. See emar for additional orders.

## 2015-06-05 NOTE — ED Provider Notes (Addendum)
[facility] Emergency Department Provider Note     Time seen: ----------------------------------------- 10:10 AM on 06/05/2015 -----------------------------------------    I have reviewed the triage vital signs and the nursing notes.   HISTORY  Chief Complaint Fall and Shoulder Injury    HPI Sara Pierce is a 74 y.o. female brought to ER from home status post fall. Patient states she tripped over the vacuum cleaner cord and fell. Patient denies hitting her head or having loss consciousness. She felt her left side feels her left shoulder is dislocated. He also notes a skin tear to the left knee area. Pain is severe in the left shoulder, movement of the arm makes it worse. Happened just prior to arrival   Past Medical History  Diagnosis Date  . COPD (chronic obstructive pulmonary disease)   . Clotting disorder   . Chronic kidney disease   . Depression   . Cardiomyopathy   . Arthritis   . Cataract   . Cerebral hemorrhage   . Diabetes mellitus without complication   . Hypertension   . Obesity   . Sleep apnea   . Urinary frequency   . Vaginal atrophy   . Varicose veins   . Mixed incontinence   . Thyroid disease     Patient Active Problem List   Diagnosis Date Noted  . Absolute anemia 03/15/2015  . Anxiety 03/15/2015  . Arthritis 03/15/2015  . Cardiomyopathy, secondary 03/15/2015  . Cataract 03/15/2015  . Brain bleed 03/15/2015  . Chronic constipation 03/15/2015  . Chronic kidney failure 03/15/2015  . Blood clotting disorder 03/15/2015  . CAFL (chronic airflow limitation) 03/15/2015  . Clinical depression 03/15/2015  . Diabetes 03/15/2015  . H/O varicella 03/15/2015  . BP (high blood pressure) 03/15/2015  . Mixed incontinence 03/15/2015  . 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 Adiposity 03/15/2015  . Apnea, sleep 03/15/2015  . Disease of thyroid gland 03/15/2015  . FOM (frequency of micturition) 03/15/2015  . Atrophy  of vagina 03/15/2015  . 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 Candida vaginitis 03/15/2015  . Abnormal ECG 03/15/2015    Past Surgical History  Procedure Laterality Date  . Appendectomy    . Cardiac catherization    . Pacemaker insertion    . Joint replacement    . Bilateral oophorectomy    . Spine surgery    . Strabismus surgery    . Tonsillectomy    . Tubal ligation      Allergies Aspirin; Codeine; and Tape  Social History History  Substance Use Topics  . Smoking status: Former Research scientist (life sciences)  . Smokeless tobacco: Not on file  . Alcohol Use: Yes     Comment: occasional    Review of Systems Constitutional: Negative for fever. Eyes: Negative for visual changes. ENT: Negative for sore throat. Cardiovascular: Negative for chest pain. Respiratory: Negative for shortness of breath. Gastrointestinal: Negative for abdominal pain, vomiting and diarrhea. Genitourinary: Negative for dysuria. Musculoskeletal: Positive for left shoulder pain, left knee pain Skin: Negative for rash. Neurological: Negative for headaches, focal weakness or numbness.  10-point ROS otherwise negative.  ____________________________________________   PHYSICAL EXAM:  VITAL SIGNS: ED Triage Vitals  Enc Vitals Group     BP 06/05/15 1003 96/82 mmHg     Pulse Rate 06/05/15 1003 59     Resp 06/05/15 1003 18     Temp 06/05/15 1003 97.9 F (36.6 C)     Temp src --      SpO2 06/05/15 1003 95 %  Weight 06/05/15 1003 241 lb (109.317 kg)     Height 06/05/15 1003 5\' 5"  (1.651 m)     Head Cir --      Peak Flow --      Pain Score 06/05/15 1004 10     Pain Loc --      Pain Edu? --      Excl. in [place]? --     Constitutional: Alert and oriented. Mild to moderate distress Eyes: Conjunctivae are normal. PERRL. Normal extraocular movements. ENT   Head: Normocephalic and atraumatic.   Nose: No congestion/rhinnorhea.   Mouth/Throat: Mucous membranes are moist.   Neck: No stridor. Cardiovascular: Normal  rate, regular rhythm. Normal and symmetric distal pulses are present in all extremities. No murmurs, rubs, or gallops. Respiratory: Normal respiratory effort without tachypnea nor retractions. Breath sounds are clear and equal bilaterally. No wheezes/rales/rhonchi. Gastrointestinal: Soft and nontender. No distention. No abdominal bruits. There is no CVA tenderness. Musculoskeletal: Patient unable to move left shoulder, severe tenderness over the left shoulder and proximal humerus area. His also a curvilinear laceration that appears superficial overlying the patella on the left. Neurologic:  Normal speech and language. No gross focal neurologic deficits are appreciated. Speech is normal.  Skin:  Laceration noted over the left patella. Psychiatric: Mood and affect are normal. Speech and behavior are normal. Patient exhibits appropriate insight and judgment. __________________________  ED COURSE:  Pertinent labs & imaging results that were available during my care of the patient were reviewed by me and considered in my medical decision making (see chart for details). Patient receive IV pain medication and x-rays. We'll also need some wound repair on the left knee  LACERATION REPAIR Performed by: Earleen Newport Authorized by: Lenise Arena E Consent: Verbal consent obtained. Risks and benefits: risks, benefits and alternatives were discussed Consent given by: patient Patient identity confirmed: provided demographic data Prepped and Draped in normal sterile fashion Wound explored  Laceration Location: Left patella, superficial, more of a skin tear  Laceration Length: 4 cm  No Foreign Bodies seen or palpated   Irrigation method: syringe Amount of cleaning: standard  Skin closure: Dermabond   Number of sutures: None   Technique: Standard Dermabond fashion   Patient tolerance: Patient tolerated the procedure well with no immediate  complications.  ____________________________________________  RADIOLOGY Images were viewed by me  Left shoulder and knee x-rays were performed Patient with proximal humerus fracture, normal left knee x-ray IMPRESSION: Comminuted, mildly displaced proximal humerus fracture.  ____________________________________________  FINAL ASSESSMENT AND PLAN  Fall and left humeral head fracture  Plan: Patient be given IV narcotic pain medication for pain control here, is being placed in a shoulder immobilizer. Pain control has been achieved. She will follow up with Dr. Marry Guan is now patient.   Earleen Newport, MD   Earleen Newport, MD 06/05/15 [place], MD 06/05/15 [phone]

## 2015-06-05 NOTE — ED Notes (Signed)
Shoulder immobilizer applied to L arm. Intact neurovascularly after application.

## 2015-06-05 NOTE — Discharge Instructions (Signed)
Humerus Fracture, Treated with Immobilization  The humerus is the large bone in your upper arm. You have a broken (fractured) humerus. These fractures are easily diagnosed with X-rays.  TREATMENT   Simple fractures which will heal without disability are treated with simple immobilization. Immobilization means you will wear a cast, splint, or sling. You have a fracture which will do well with immobilization. The fracture will heal well simply by being held in a good position until it is stable enough to begin range of motion exercises. Do not take part in activities which would further injure your arm.   HOME CARE INSTRUCTIONS    Put ice on the injured area.   Put ice in a plastic bag.   Place a towel between your skin and the bag.   Leave the ice on for 15-20 minutes, 03-04 times a day.   If you have a cast:   Do not scratch the skin under the cast using sharp or pointed objects.   Check the skin around the cast every day. You may put lotion on any red or sore areas.   Keep your cast dry and clean.   If you have a splint:   Wear the splint as directed.   Keep your splint dry and clean.   You may loosen the elastic around the splint if your fingers become numb, tingle, or turn cold or blue.   If you have a sling:   Wear the sling as directed.   Do not put pressure on any part of your cast or splint until it is fully hardened.   Your cast or splint can be protected during bathing with a plastic bag. Do not lower the cast or splint into water.   Only take over-the-counter or prescription medicines for pain, discomfort, or fever as directed by your caregiver.   Do range of motion exercises as instructed by your caregiver.   Follow up as directed by your caregiver. This is very important in order to avoid permanent injury or disability and chronic pain.  SEEK IMMEDIATE MEDICAL CARE IF:    Your skin or nails in the injured arm turn blue or gray.   Your arm feels cold or numb.   You develop severe  pain in the injured arm.   You are having problems with the medicines you were given.  MAKE SURE YOU:    Understand these instructions.   Will watch your condition.   Will get help right away if you are not doing well or get worse.  Document Released: 03/07/2001 Document Revised: 02/21/2012 Document Reviewed: 01/13/2011  Exit[employer] Patient Information 2015 [employer]. This information is not intended to replace advice given to you by your health care provider. Make sure you discuss any questions you have with your health care provider.

## 2015-07-09 ENCOUNTER — Other Ambulatory Visit: Payer: Self-pay | Admitting: Geriatric Medicine

## 2015-07-09 DIAGNOSIS — Z1231 Encounter for screening mammogram for malignant neoplasm of breast: Secondary | ICD-10-CM

## 2015-07-09 DIAGNOSIS — Z9181 History of falling: Secondary | ICD-10-CM

## 2015-08-06 ENCOUNTER — Ambulatory Visit: Payer: Medicare (Managed Care)

## 2015-08-20 IMAGING — CR DG CHEST 1V PORT
1 series · 1 of 1 positions shown · non-contrast
Comparison: 10/11/2013

CLINICAL DATA: Shortness of breath.

EXAM:
PORTABLE CHEST - 1 VIEW

[ap]
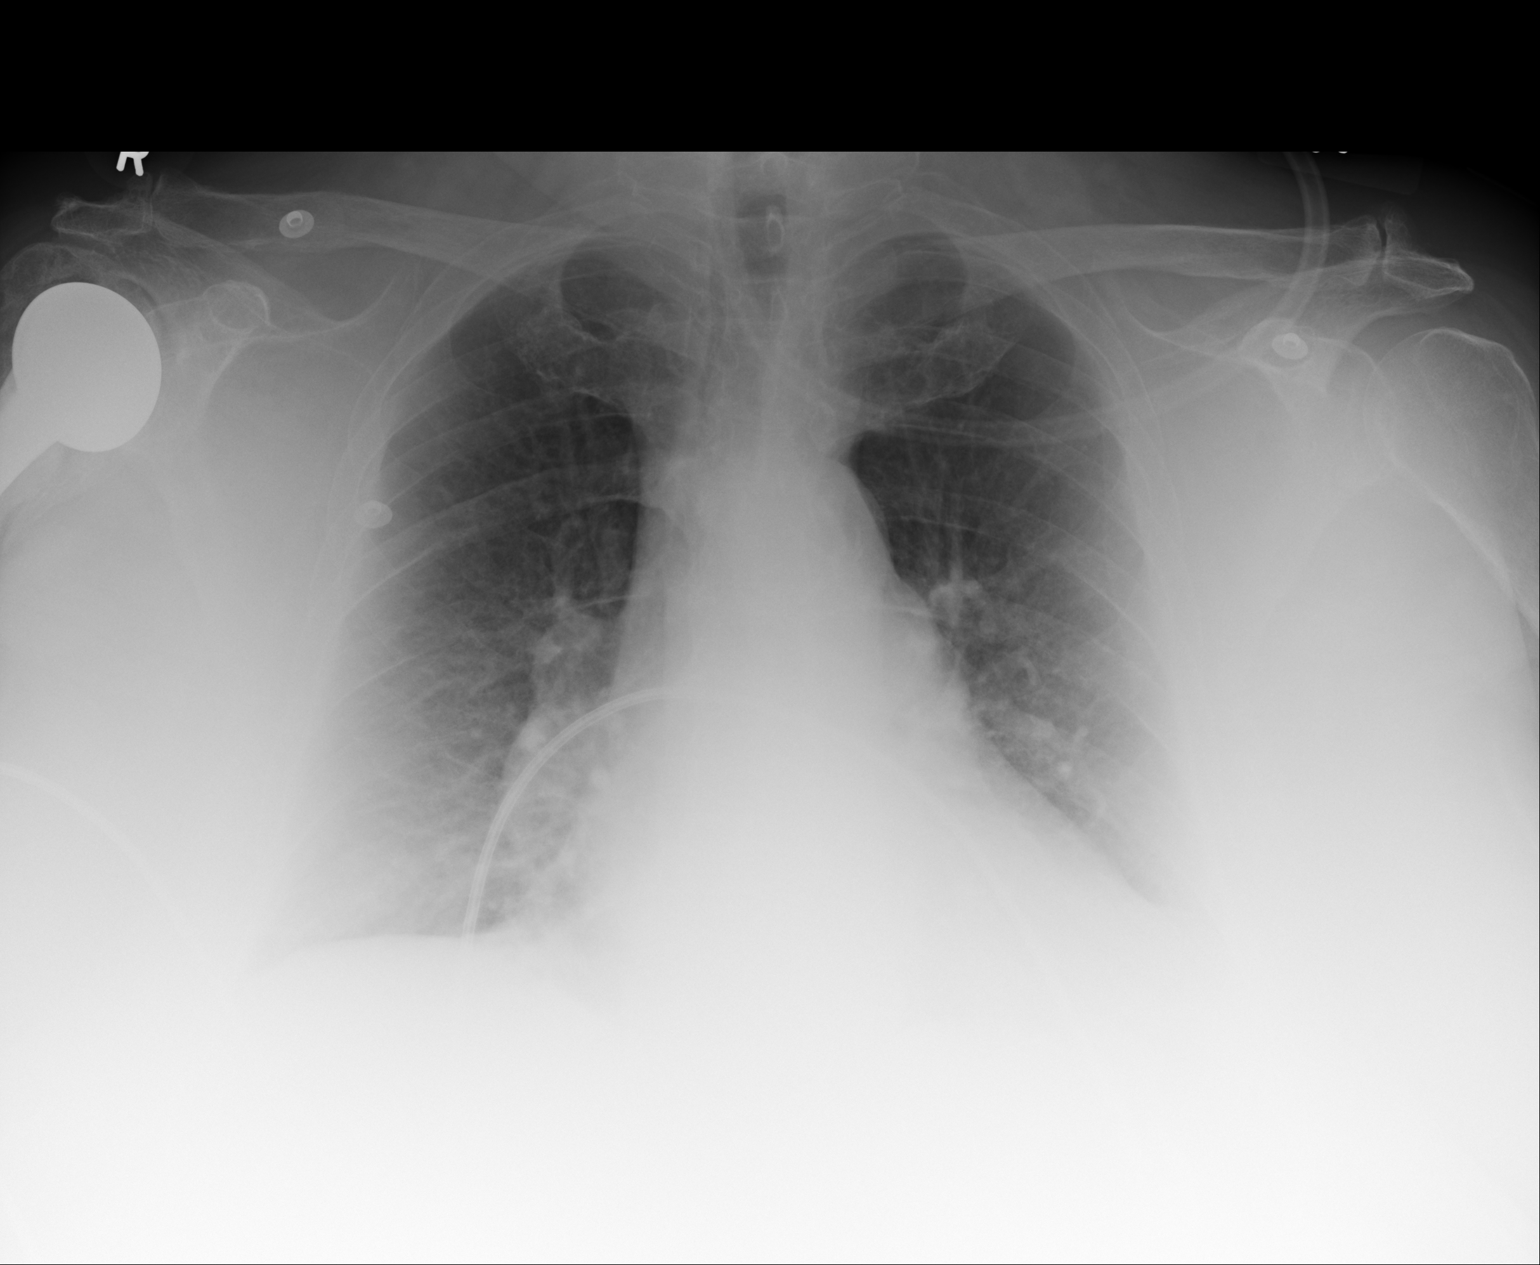

[1 of 1 positions shown; findings below may reference images not displayed]

FINDINGS: Heart size and pulmonary vascularity are normal and lungs are clear.
No acute osseous abnormality.
IMPRESSION: No acute abnormalities.

## 2015-08-23 IMAGING — CR DG CHEST 2V
1 series · 2 of 2 positions shown · non-contrast
Comparison: Chest radiograph January 10, 2014

CLINICAL DATA: Cough, right-sided pain.  Shortness of breath.

EXAM:
CHEST  2 VIEW

[Series 1: w chest lat · 0.14mm/px · 2 of 2 slices shown]
[im 1/2]
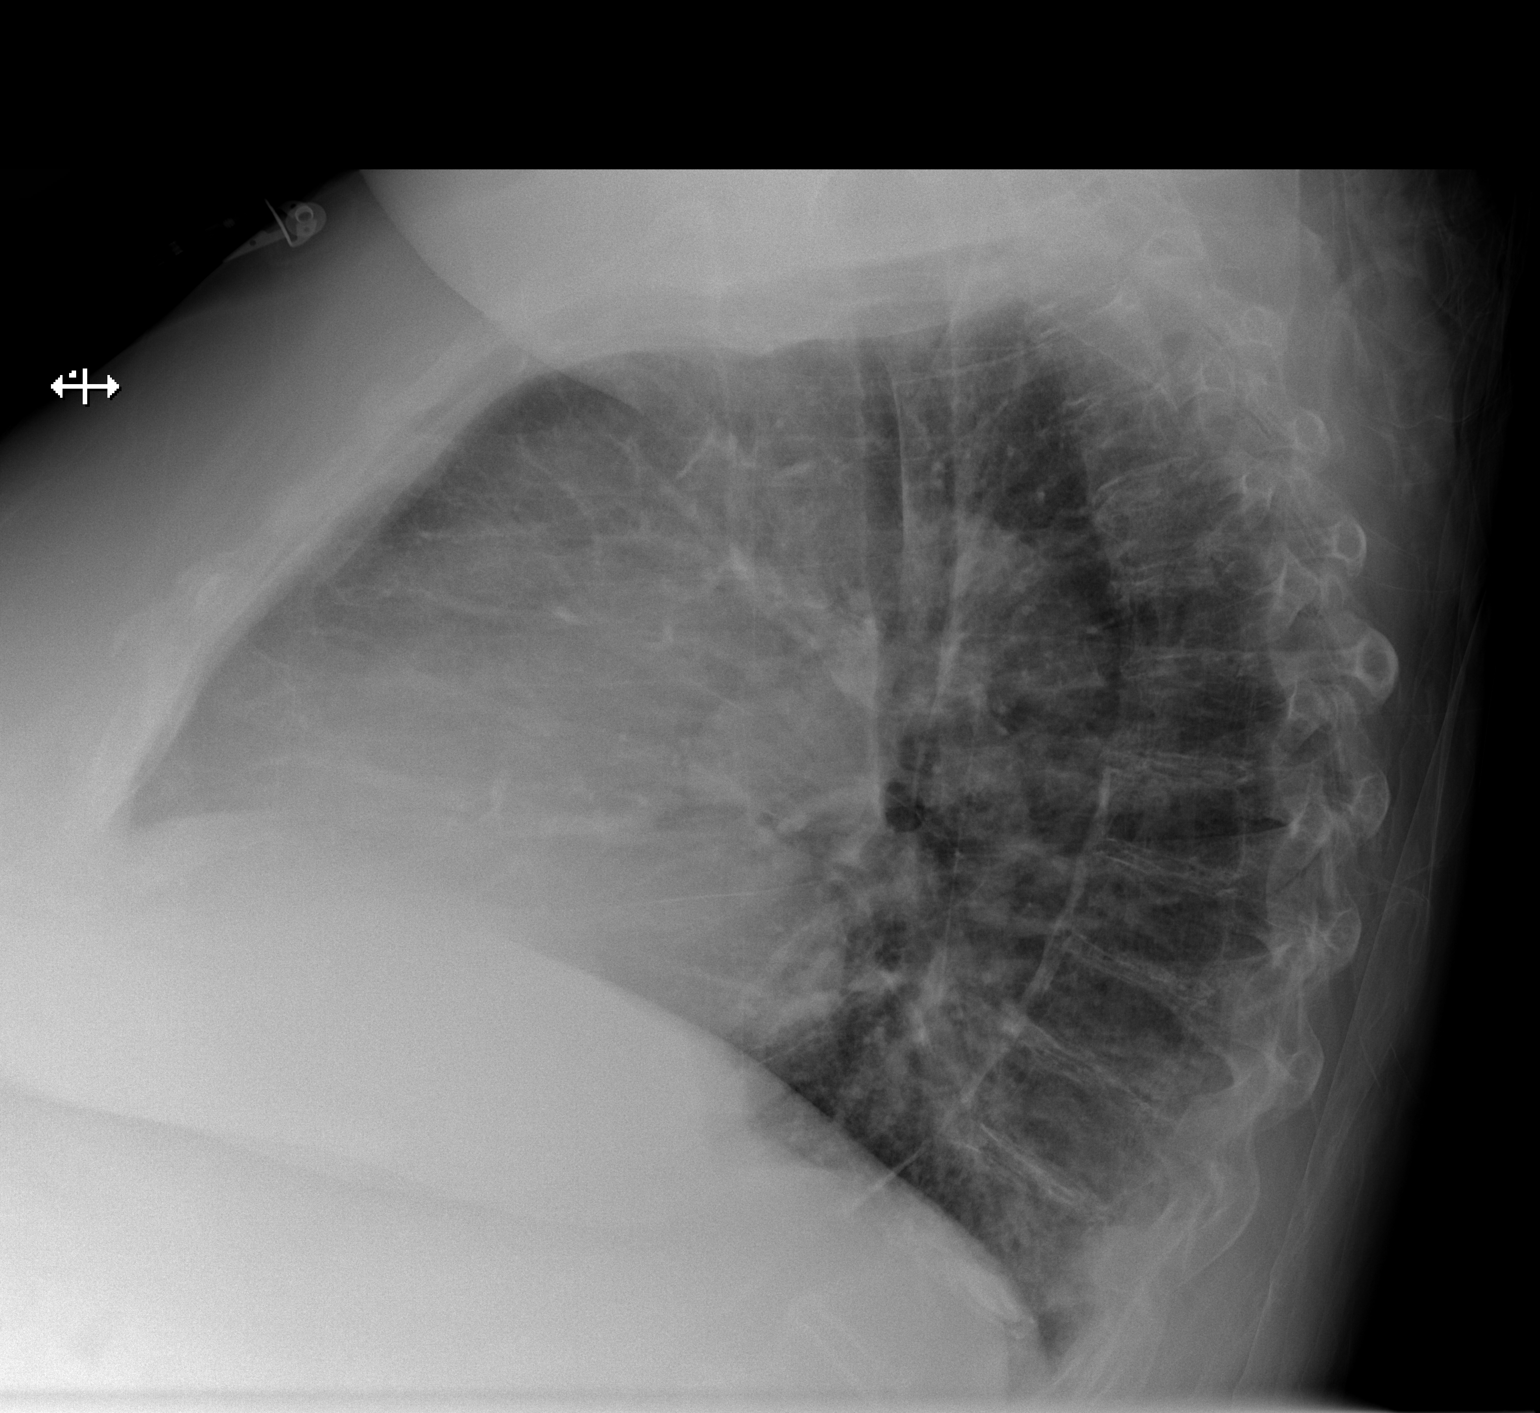
[im 2/2]
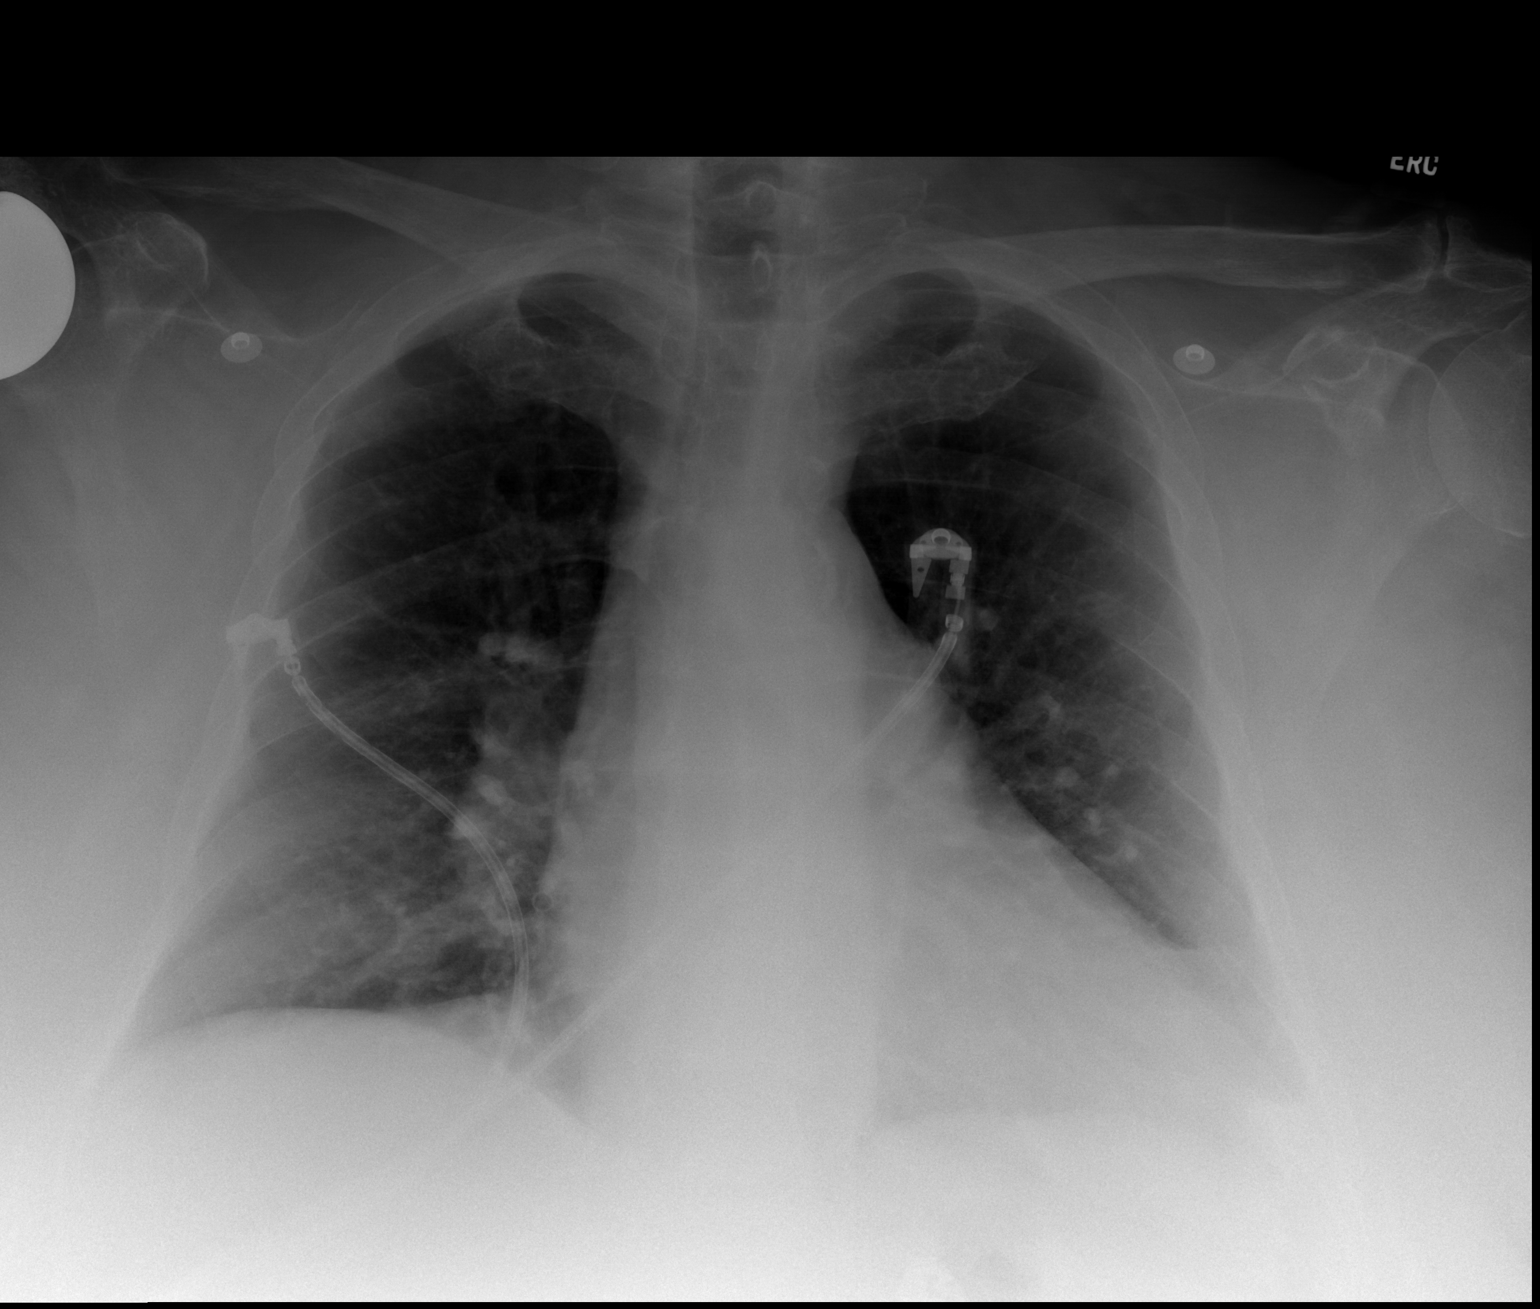

[2 of 2 positions shown; findings below may reference images not displayed]

FINDINGS: The cardiac silhouette remains moderately enlarged, calcified aortic
knob. Mild central pulmonary vasculature congestion without pleural
effusions or focal consolidations. No pneumothorax.

Osteopenia, status post right humeral arthroplasty. Multiple EKG
lines overlie the patient and may obscure subtle underlying
pathology.
IMPRESSION: Stable cardiomegaly and central pulmonary vasculature congestion.

  By: Antonis Helene Ouggrou

## 2015-09-10 ENCOUNTER — Ambulatory Visit
Admission: RE | Admit: 2015-09-10 | Discharge: 2015-09-10 | Disposition: A | Discharge status: Home / Self Care | Payer: Medicare (Managed Care) | Source: Ambulatory Visit | Attending: Geriatric Medicine | Admitting: Geriatric Medicine

## 2015-09-10 DIAGNOSIS — Z9181 History of falling: Secondary | ICD-10-CM

## 2015-09-10 DIAGNOSIS — Z1231 Encounter for screening mammogram for malignant neoplasm of breast: Secondary | ICD-10-CM | POA: Diagnosis present

## 2015-09-10 DIAGNOSIS — M810 Age-related osteoporosis without current pathological fracture: Secondary | ICD-10-CM | POA: Diagnosis not present

## 2015-09-12 IMAGING — CR DG KNEE COMPLETE 4+V*L*
1 series · 4 of 4 positions shown · non-contrast
Comparison: None available for comparison at time of study
interpretation.

CLINICAL DATA: Left pain and bruising and swelling.  Fall.

EXAM:
LEFT KNEE - COMPLETE 4+ VIEW

[Series 4: t knee obl left · 0.14mm/px · 4 of 4 slices shown]
[im 1/4]
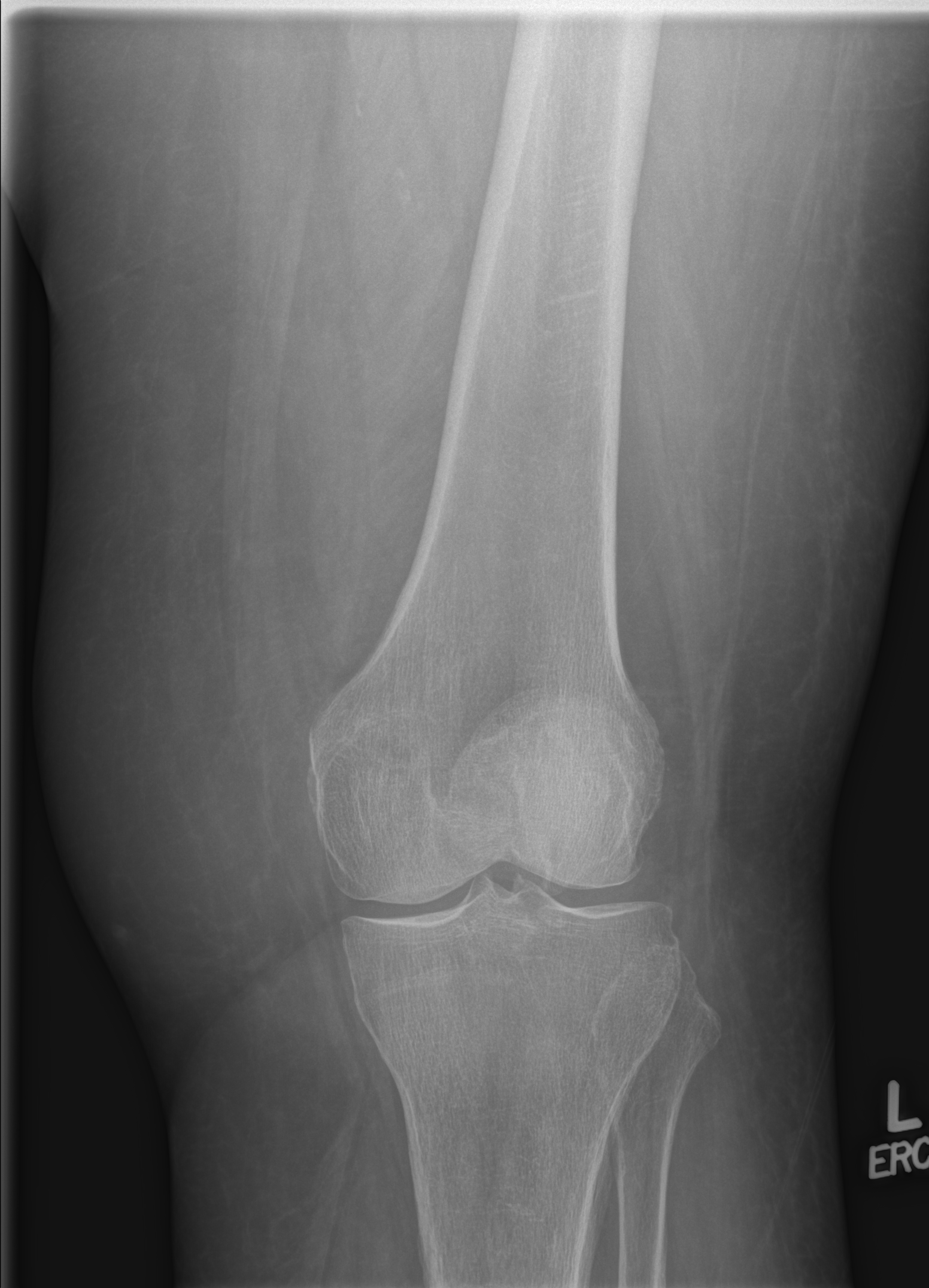
[im 2/4]
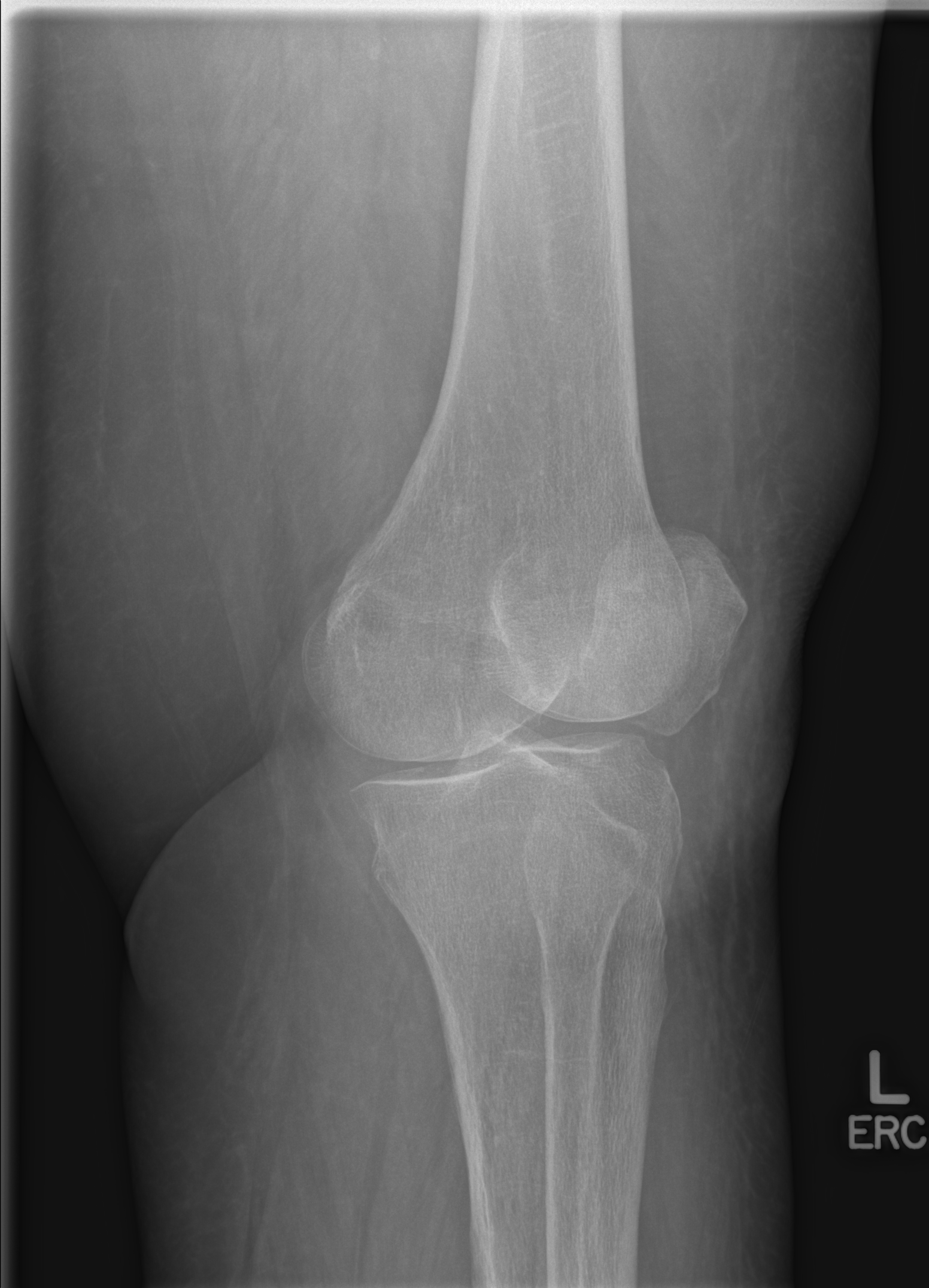
[im 3/4]
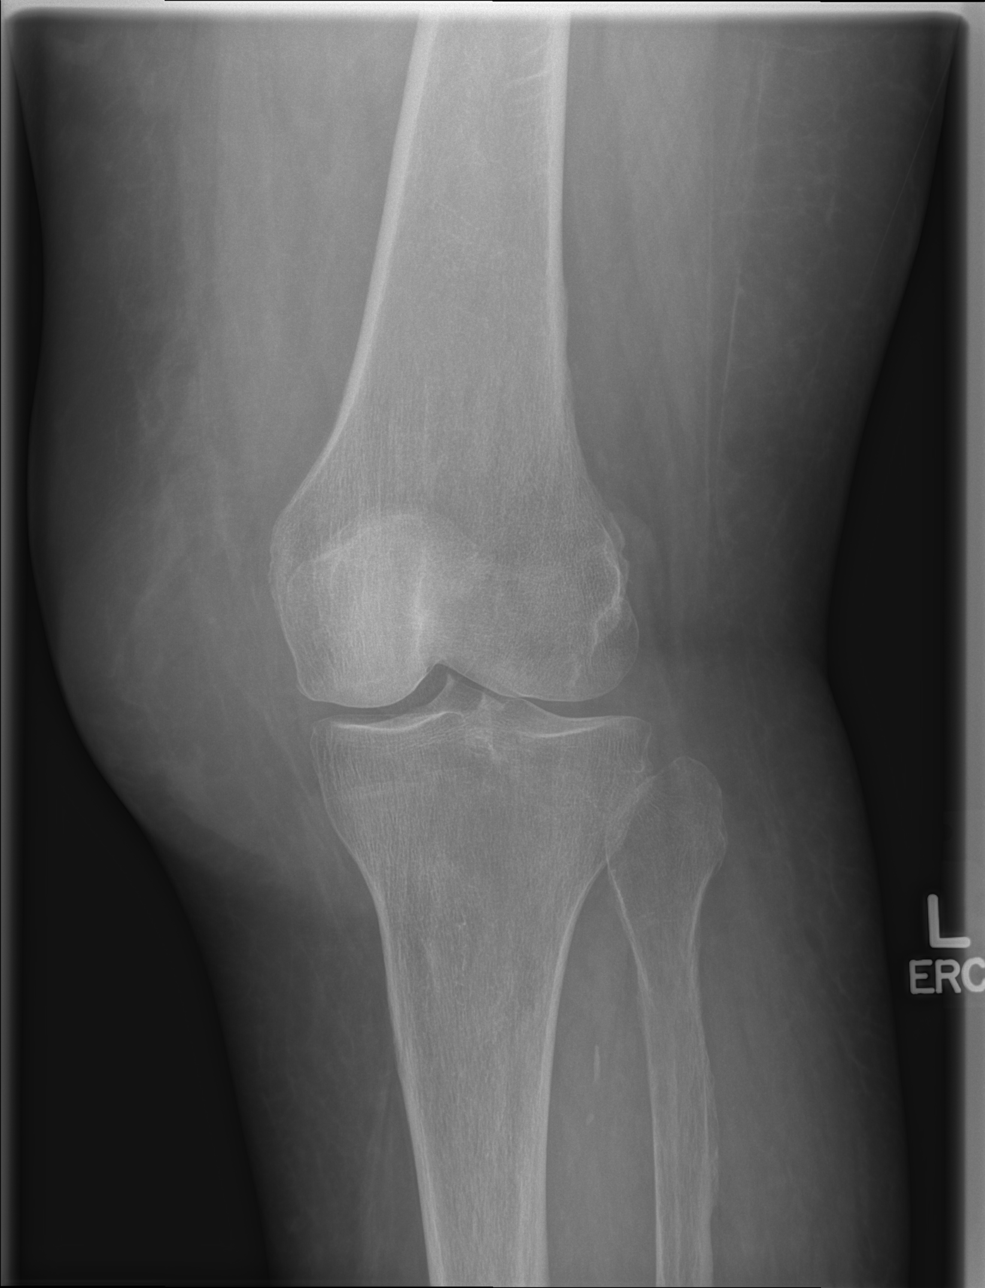
[im 4/4]
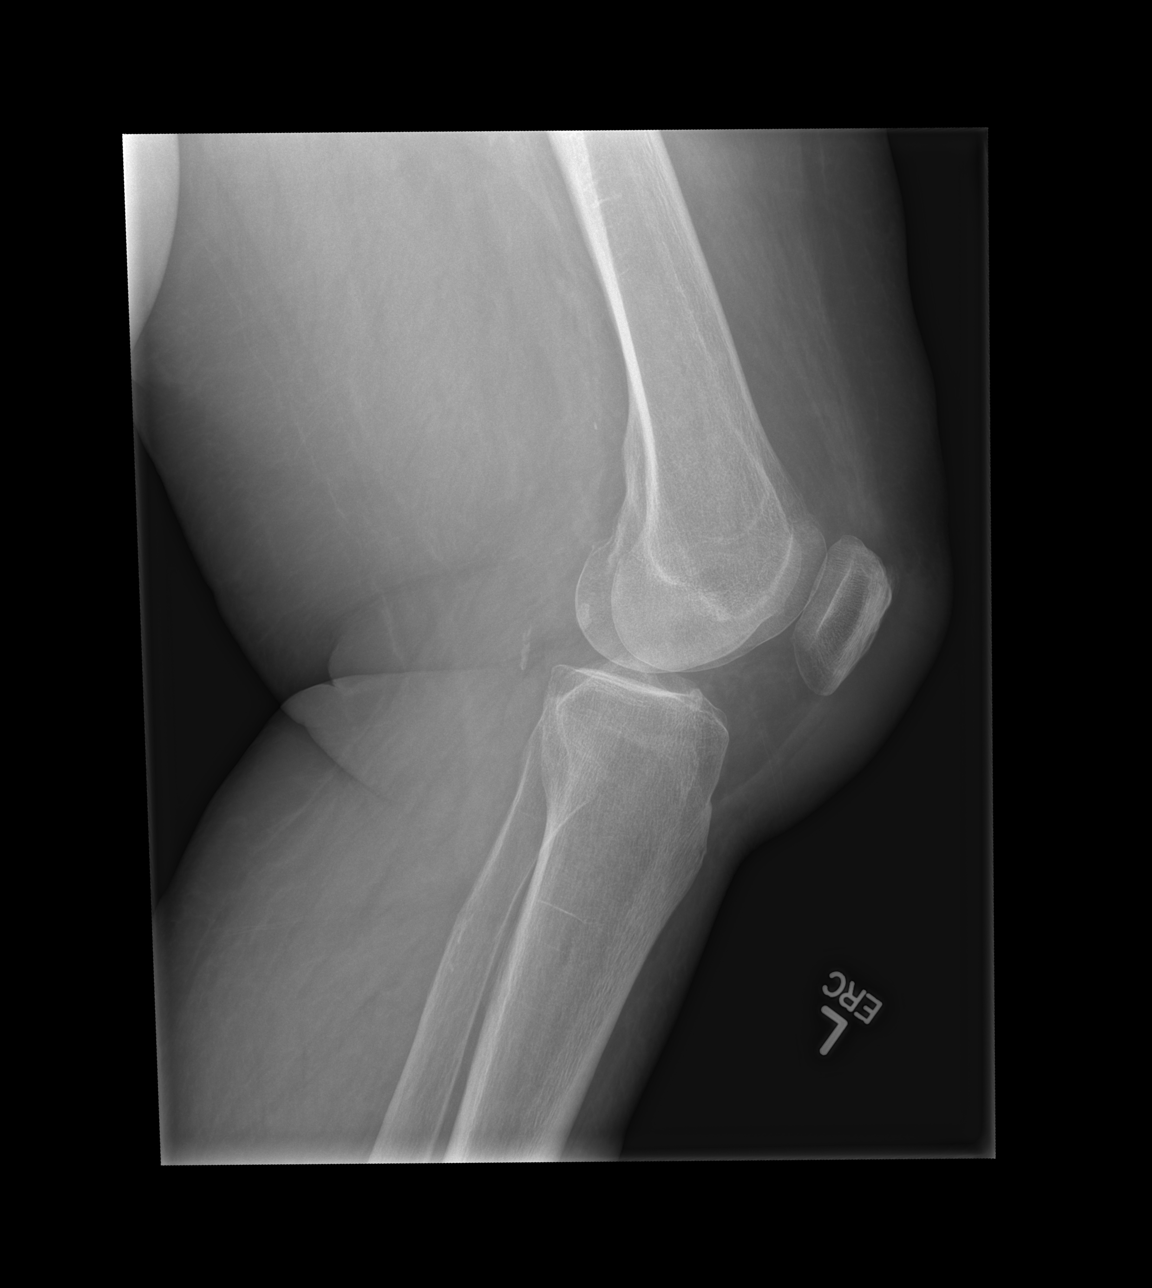

[4 of 4 positions shown; findings below may reference images not displayed]

FINDINGS: There is no evidence of fracture, dislocation, or joint effusion.
Tibial spine peaking. There is no evidence of arthropathy or other
focal bone abnormality. Patient is osteopenic. Prepatellar soft
tissue swelling. Mild vascular calcifications.
IMPRESSION: No acute fracture deformity or dislocation. Patient is osteopenic
which may decrease sensitivity acute nondisplaced fractures.

Prepatellar soft tissue prominence which could reflect habitus or,
can be seen with bursitis.

  By: Pily Jiang

## 2015-09-12 IMAGING — CT CT HEAD WITHOUT CONTRAST
3 of 6 series · 15 of 47 positions shown, 18 images · non-contrast
Comparison: CT HEAD W/O CM dated 05/21/2010

CLINICAL DATA: Fall, bloody nose.  Evaluate head injury.

EXAM:
CT HEAD WITHOUT CONTRAST
CT MAXILLOFACIAL WITHOUT CONTRAST
TECHNIQUE: Multidetector CT imaging of the head and maxillofacial structures
were performed using the standard protocol without intravenous
contrast. Multiplanar CT image reconstructions of the maxillofacial
structures were also generated.

[Series 4: max soft 2 · axial · 0.40mm/px · z∈[-88,+46]mm · 11 of 81 slices shown, 14 images]
[im 7/81  brain]
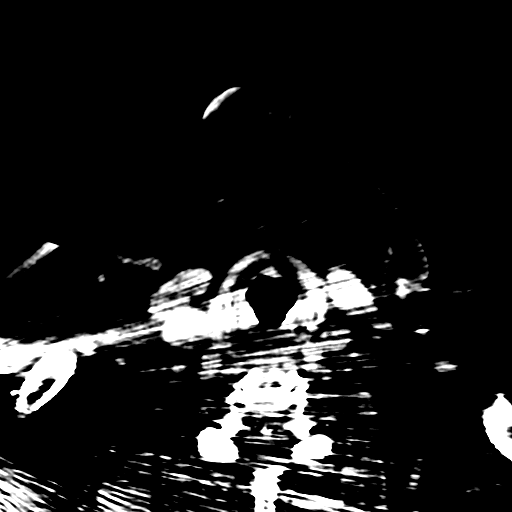
[im 7/81  bone]
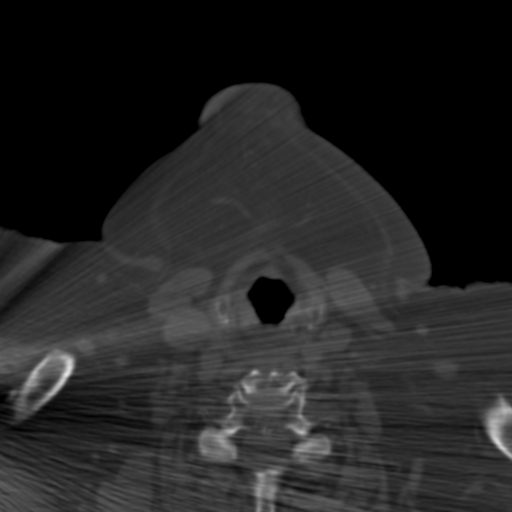
[im 13/81  brain]
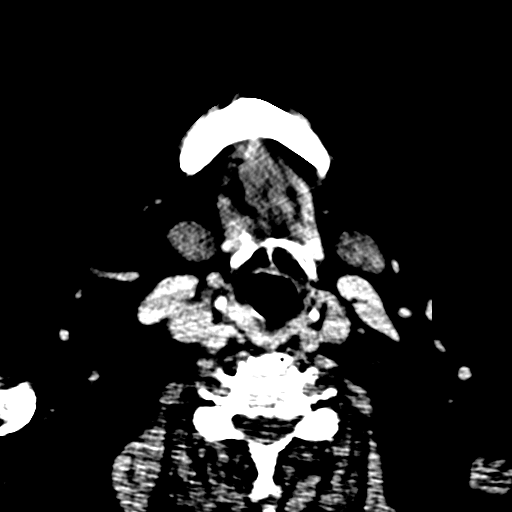
[im 19/81  brain]
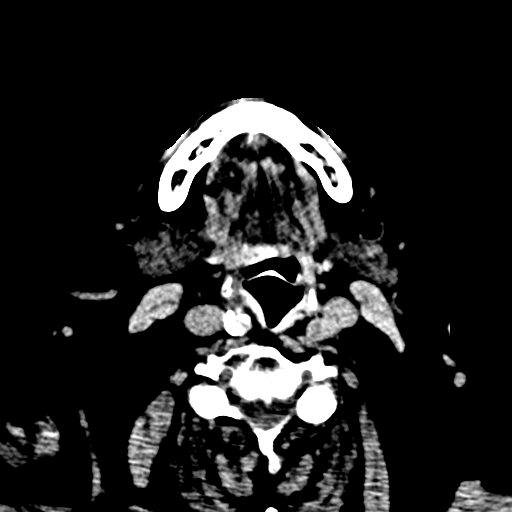
[im 25/81  brain]
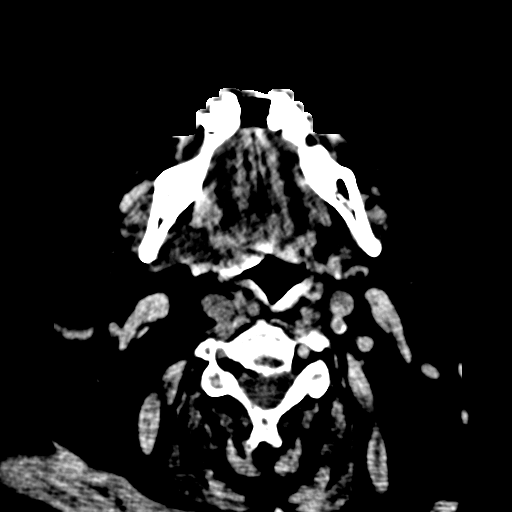
[im 31/81  brain]
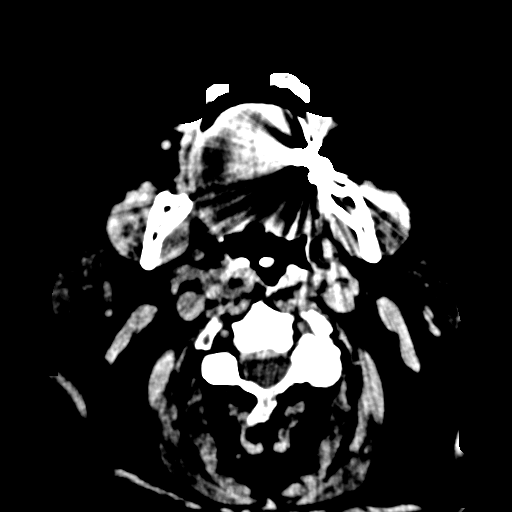
[im 31/81  bone]
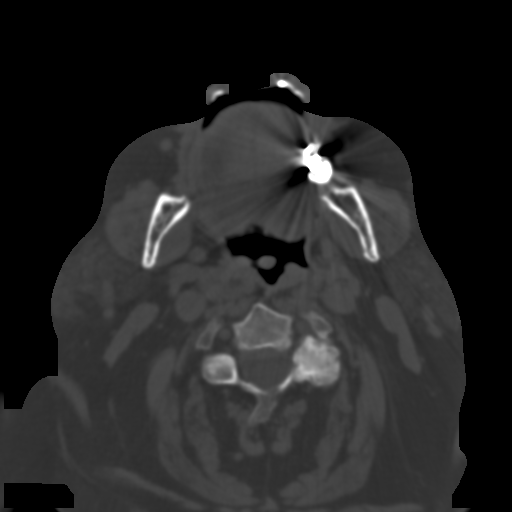
[im 44/81  brain]
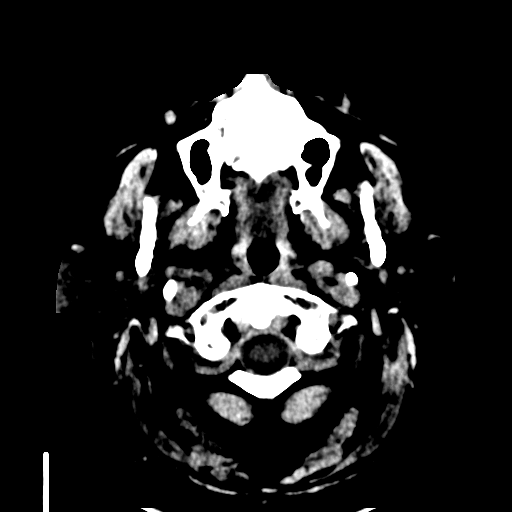
[im 50/81  brain]
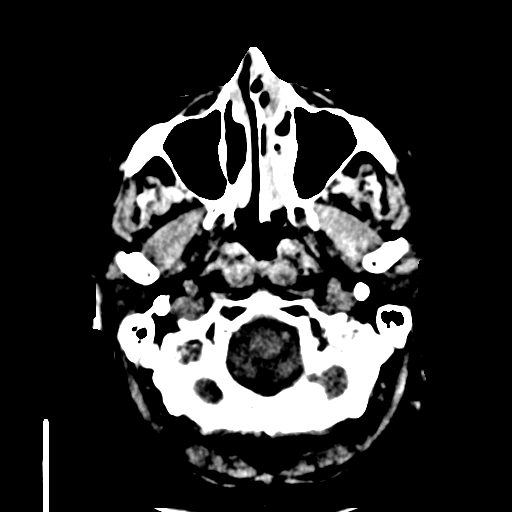
[im 56/81  brain]
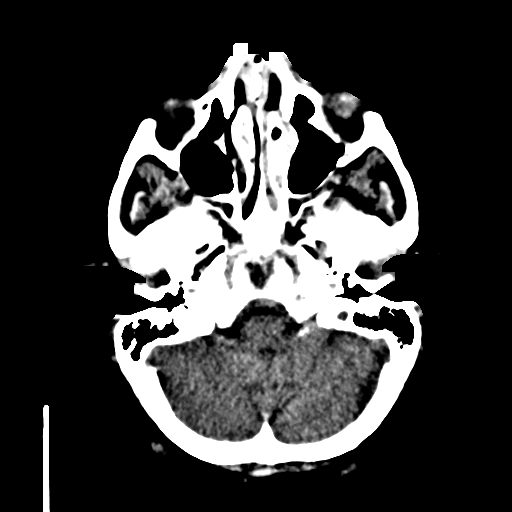
[im 62/81  brain]
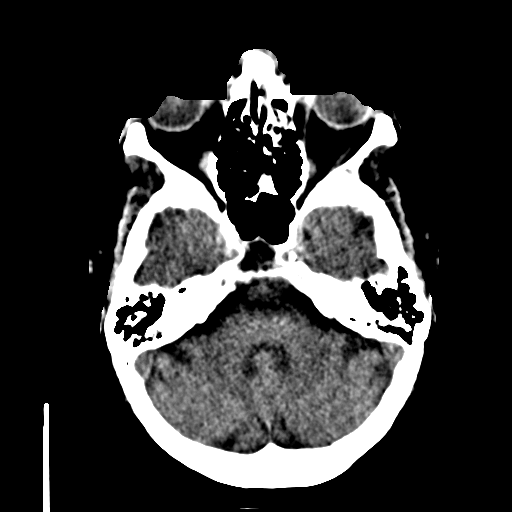
[im 62/81  bone]
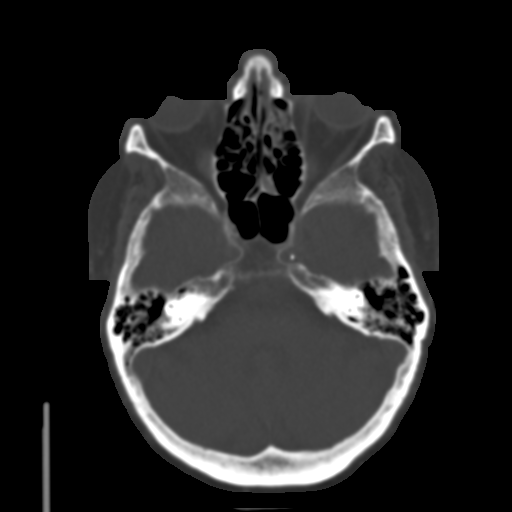
[im 68/81  brain]
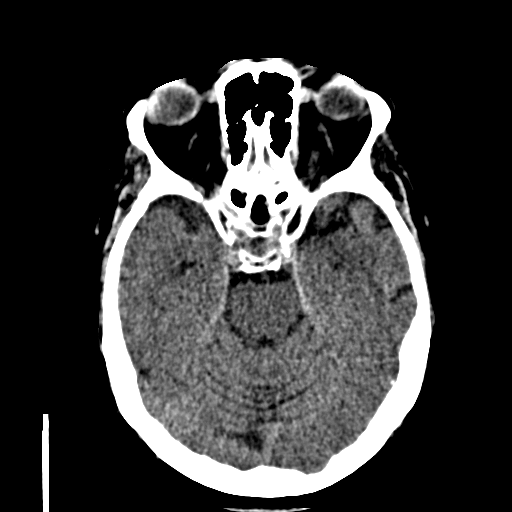
[im 74/81  brain]
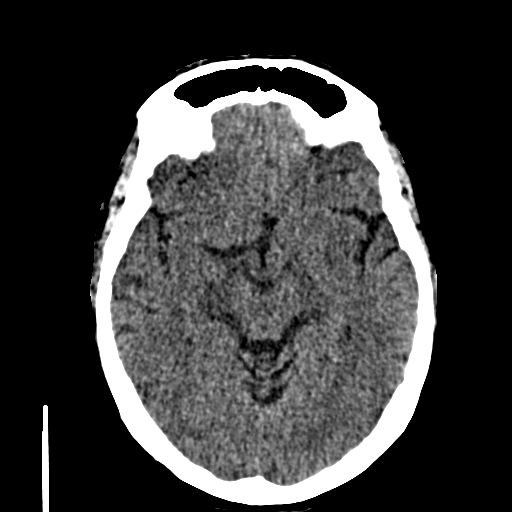

[Series 6: coronal soft · coronal · 0.33mm/px · 3 of 85 slices shown]
[im 29/85  brain]
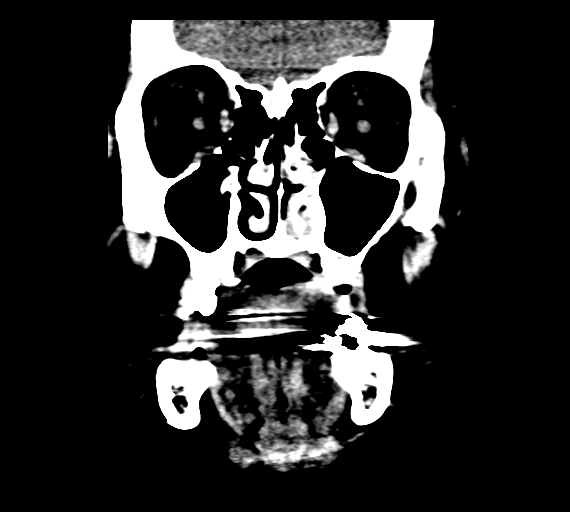
[im 38/85  brain]
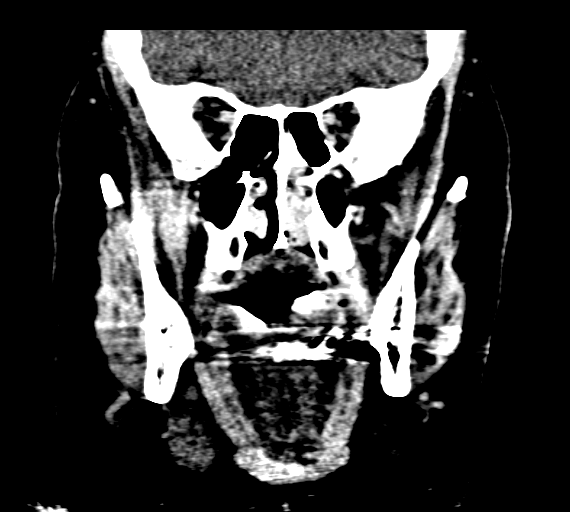
[im 47/85  brain]
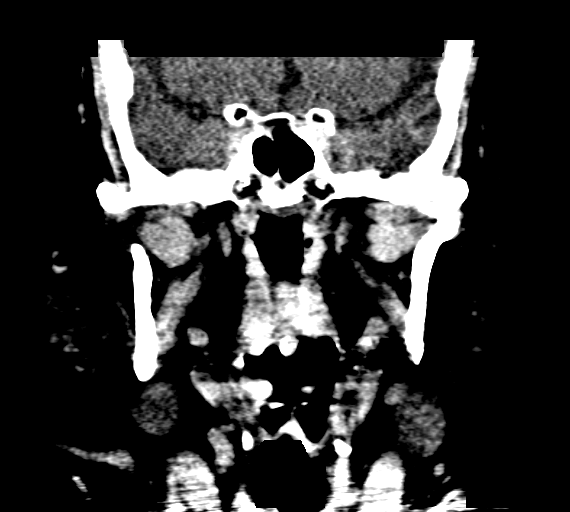

[Series 7: sagittal soft · sagittal · 0.39mm/px · 1 of 89 slices shown]
[im 45/89  brain]
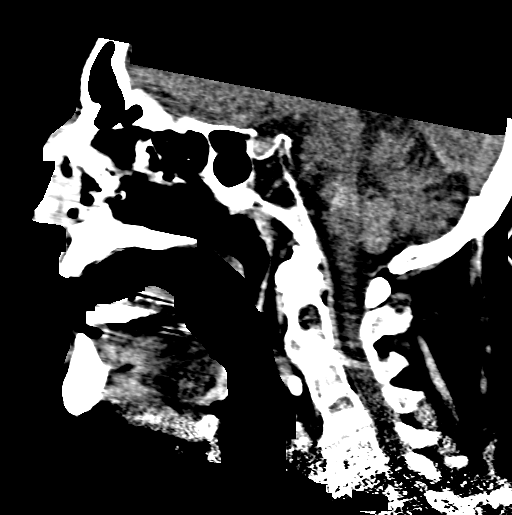

[15 of 47 positions shown; findings below may reference images not displayed]

FINDINGS: CT HEAD FINDINGS

The ventricles and sulci are normal for age. No intraparenchymal
hemorrhage, mass effect nor midline shift. Patchy supratentorial
white matter hypodensities are within normal range for patient's age
and though non-specific suggest sequelae of chronic small vessel
ischemic disease. No acute large vascular territory infarcts. No
definite pontine lacunar infarct on today's examination.

No abnormal extra-axial fluid collections. Basal cisterns are
patent. Moderate calcific atherosclerosis of the carotid siphons. No
skull fracture.

CT MAXILLOFACIAL FINDINGS

Mildly depressed right nasal bone fracture. In addition,
nondisplaced left nasal bone fracture. The mandible is intact and
condyles are located.

Mild ethmoid and maxillary mucosal thickening with trace right
maxillary sinus air-fluid level.

Nasal septum is slightly deviated to the right. Bilateral concha
bullosa partially opacified with soft tissue density. No destructive
bony lesions. The patient is predominantly edentulous. Ocular globes
and orbital contents are nonsuspicious, status post bilateral ocular
lens implants.
IMPRESSION: CT head:  No acute intracranial process.

Involutional changes. Mild to moderate white matter changes suggest
chronic small vessel ischemic disease.

CT maxillofacial: Mildly depressed right nasal bone fracture,
nondisplaced left nasal bone fracture.

  By: Autolakovna Maani

## 2015-09-12 IMAGING — CR DG RIBS 2V*R*
1 series · 4 of 4 positions shown · non-contrast
Comparison: DG CHEST 2V dated 01/13/2014

CLINICAL DATA: Right anterior rib pain after fall, left pain
bruising and swelling.

EXAM:
RIGHT RIBS - 2 VIEW

[Series 4: w ribs ap upper right · 0.14mm/px · 4 of 4 slices shown]
[im 1/4]
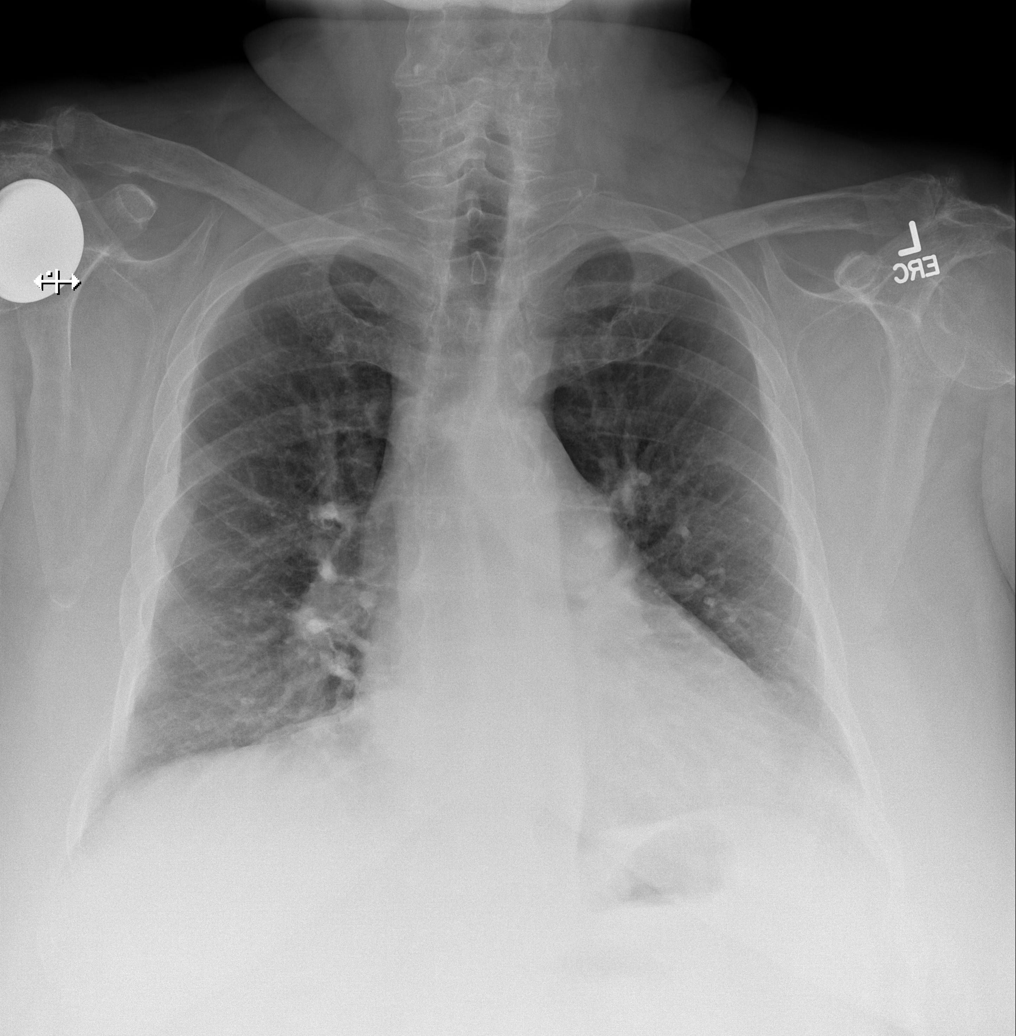
[im 2/4]
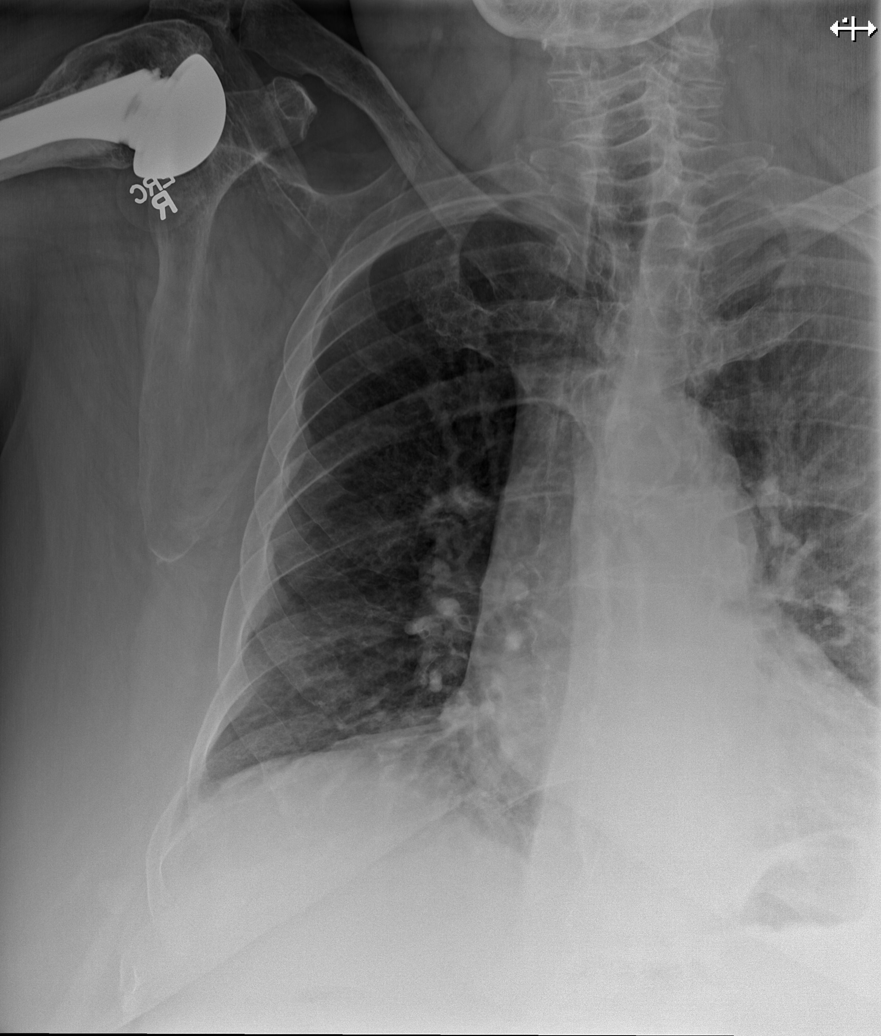
[im 3/4]
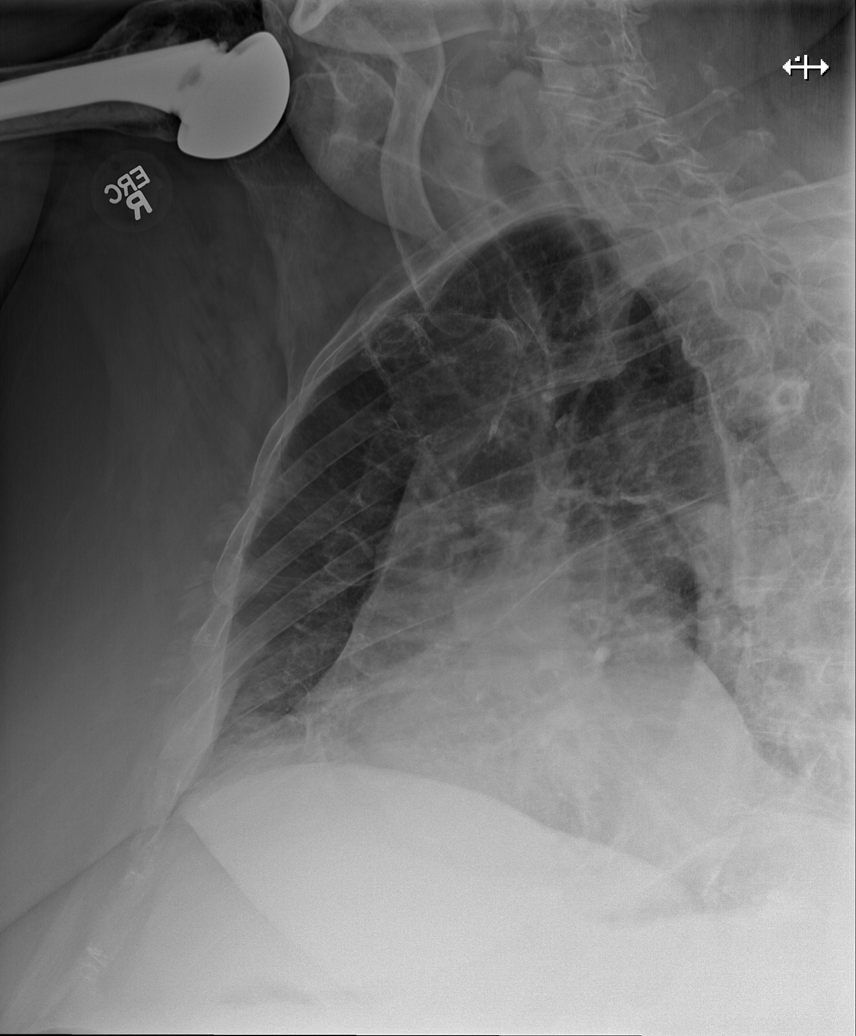
[im 4/4]
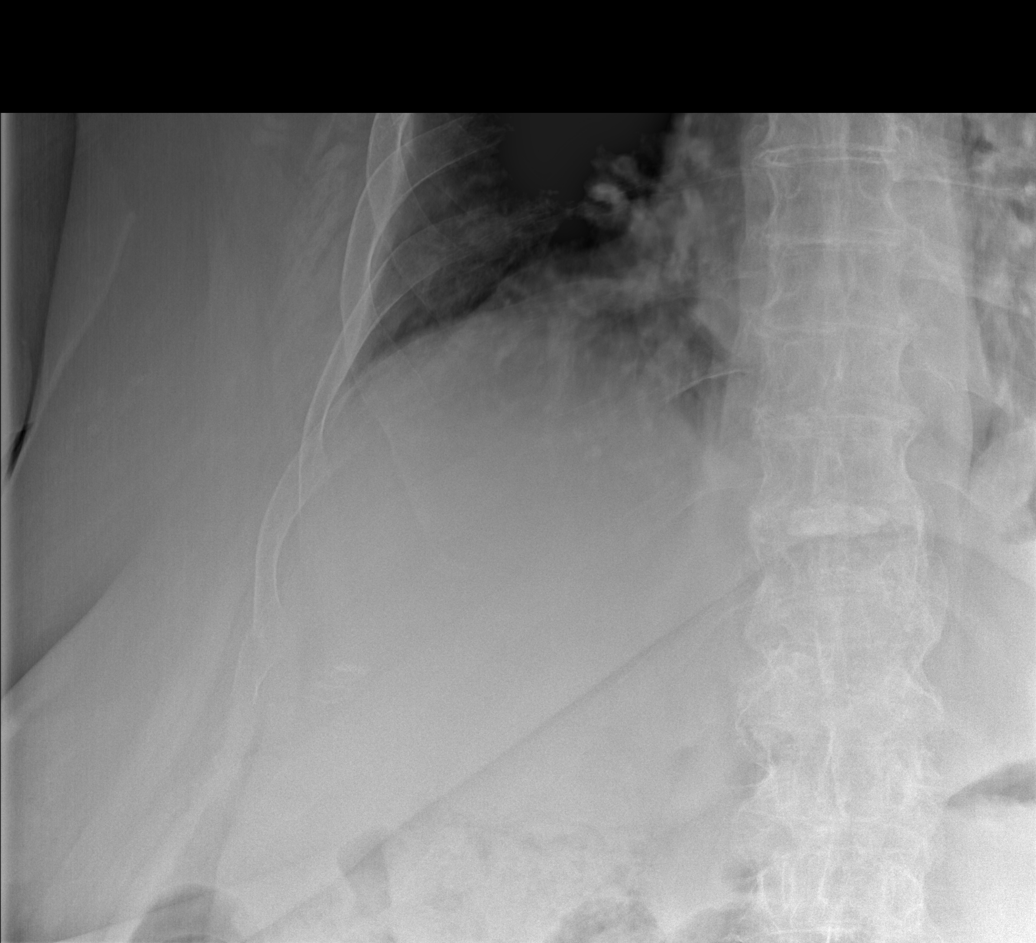

[4 of 4 positions shown; findings below may reference images not displayed]

FINDINGS: Cardiac silhouette remains mildly enlarged, mediastinal silhouette
is unremarkable. Similar central pulmonary vasculature congestion
and chronic interstitial changes without pleural effusions or focal
consolidations. No pneumothorax.

Status post right humeral arthroplasty. Patient is osteopenic. No
rib fracture deformity on the right.
IMPRESSION: Stable cardiomegaly and mild central pulmonary vasculature
congestion. No rib fracture deformity.

  By: Lorenz Jumper

## 2015-09-14 ENCOUNTER — Emergency Department: Payer: Medicare (Managed Care)

## 2015-09-14 ENCOUNTER — Emergency Department
Admission: EM | Admit: 2015-09-14 | Discharge: 2015-09-14 | Disposition: A | Discharge status: Home / Self Care | Payer: Medicare (Managed Care) | Attending: Emergency Medicine | Admitting: Emergency Medicine

## 2015-09-14 ENCOUNTER — Encounter: Payer: Self-pay | Admitting: Emergency Medicine

## 2015-09-14 DIAGNOSIS — J441 Chronic obstructive pulmonary disease with (acute) exacerbation: Secondary | ICD-10-CM | POA: Insufficient documentation

## 2015-09-14 DIAGNOSIS — Z7951 Long term (current) use of inhaled steroids: Secondary | ICD-10-CM | POA: Insufficient documentation

## 2015-09-14 DIAGNOSIS — Z7952 Long term (current) use of systemic steroids: Secondary | ICD-10-CM | POA: Insufficient documentation

## 2015-09-14 DIAGNOSIS — E119 Type 2 diabetes mellitus without complications: Secondary | ICD-10-CM | POA: Insufficient documentation

## 2015-09-14 DIAGNOSIS — Z87891 Personal history of nicotine dependence: Secondary | ICD-10-CM | POA: Insufficient documentation

## 2015-09-14 DIAGNOSIS — Z79899 Other long term (current) drug therapy: Secondary | ICD-10-CM | POA: Insufficient documentation

## 2015-09-14 DIAGNOSIS — R635 Abnormal weight gain: Secondary | ICD-10-CM | POA: Diagnosis not present

## 2015-09-14 DIAGNOSIS — I10 Essential (primary) hypertension: Secondary | ICD-10-CM | POA: Insufficient documentation

## 2015-09-14 DIAGNOSIS — R0600 Dyspnea, unspecified: Secondary | ICD-10-CM

## 2015-09-14 DIAGNOSIS — R0602 Shortness of breath: Secondary | ICD-10-CM | POA: Diagnosis present

## 2015-09-14 LAB — CBC
HCT: 37.7 % (ref 35.0–47.0)
HEMOGLOBIN: 12.5 g/dL (ref 12.0–16.0)
MCH: 30.9 pg (ref 26.0–34.0)
MCHC: 33.1 g/dL (ref 32.0–36.0)
MCV: 93.4 fL (ref 80.0–100.0)
Platelets: 252 10*3/uL (ref 150–440)
RBC: 4.03 MIL/uL (ref 3.80–5.20)
RDW: 14.1 % (ref 11.5–14.5)
WBC: 11.2 10*3/uL — ABNORMAL HIGH (ref 3.6–11.0)

## 2015-09-14 LAB — BASIC METABOLIC PANEL
Anion gap: 7 (ref 5–15)
BUN: 38 mg/dL — ABNORMAL HIGH (ref 6–20)
CALCIUM: 9.5 mg/dL (ref 8.9–10.3)
CHLORIDE: 100 mmol/L — AB (ref 101–111)
CO2: 29 mmol/L (ref 22–32)
Creatinine, Ser: 1.12 mg/dL — ABNORMAL HIGH (ref 0.44–1.00)
GFR calc non Af Amer: 48 mL/min — ABNORMAL LOW (ref >60)
GFR, EST AFRICAN AMERICAN: 55 mL/min — AB (ref >60)
Glucose, Bld: 106 mg/dL — ABNORMAL HIGH (ref 65–99)
POTASSIUM: 4.6 mmol/L (ref 3.5–5.1)
Sodium: 136 mmol/L (ref 135–145)

## 2015-09-14 LAB — TROPONIN I

## 2015-09-14 NOTE — ED Provider Notes (Signed)
Time Seen: Approximately ----------------------------------------- 4:40 PM on 09/14/2015 -----------------------------------------   I have reviewed the triage notes  Chief Complaint: Weight Gain and Shortness of Breath   History of Present Illness: Sara Pierce is a 74 y.o. female who presents with some weight gain over the last 9 weeks and is currently on Lasix 40 mg 3 times a day. Patient was concerned that she may be taking too much Lasix and still has persistent cough. She was given a prescription for Tessalon Pearls for cough which she states is been ineffective. She denies any fever or productive nature to the cough. She states she's gained approximately 6 pounds over the last several weeks and the cough is been occurring now for 2 weeks. He denies any calf tenderness or swelling or any chest pain currently. She states she had an episode of brief chest pain approximately 3 weeks ago. She denies any associated nausea vomiting or diaphoresis. She apparently is being evaluated on an outpatient basis for a planned stress echocardiogram or possibly heart catheterization through her primary doctor.   Past Medical History  Diagnosis Date  . COPD (chronic obstructive pulmonary disease) ([place])   . Clotting disorder ([place])   . Chronic kidney disease   . Depression   . Cardiomyopathy ([place])   . Arthritis   . Cataract   . Cerebral hemorrhage ([place])   . Diabetes mellitus without complication ([place])   . Hypertension   . Obesity   . Sleep apnea   . Urinary frequency   . Vaginal atrophy   . Varicose veins   . Mixed incontinence   . Thyroid disease     Patient Active Problem List   Diagnosis Date Noted  . Absolute anemia 03/15/2015  . Anxiety 03/15/2015  . Arthritis 03/15/2015  . Cardiomyopathy, secondary ([place]) 03/15/2015  . Cataract 03/15/2015  . Brain bleed ([place]) 03/15/2015  . Chronic constipation 03/15/2015  . Chronic kidney failure 03/15/2015  . Blood clotting disorder ([place])  03/15/2015  . CAFL (chronic airflow limitation) ([place]) 03/15/2015  . Clinical depression 03/15/2015  . Diabetes ([place]) 03/15/2015  . H/O varicella 03/15/2015  . BP (high blood pressure) 03/15/2015  . Mixed incontinence 03/15/2015  . 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 Adiposity 03/15/2015  . Apnea, sleep 03/15/2015  . Disease of thyroid gland 03/15/2015  . FOM (frequency of micturition) 03/15/2015  . 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 Candida vaginitis 03/15/2015  . Abnormal ECG 03/15/2015    Past Surgical History  Procedure Laterality Date  . Appendectomy    . Cardiac catherization    . Pacemaker insertion    . Joint replacement    . Bilateral oophorectomy    . Spine surgery    . Strabismus surgery    . Tonsillectomy    . Tubal ligation      Past Surgical History  Procedure Laterality Date  . Appendectomy    . Cardiac catherization    . Pacemaker insertion    . Joint replacement    . Bilateral oophorectomy    . Spine surgery    . Strabismus surgery    . Tonsillectomy    . Tubal ligation      Current Outpatient Rx  Name  Route  Sig  Dispense  Refill  . albuterol (PROVENTIL HFA;VENTOLIN HFA) 108 (90 BASE) MCG/ACT inhaler   Inhalation   Inhale 2 puffs into the lungs every 6 (six) hours as needed for wheezing.          Marland Kitchen  benzonatate (TESSALON) 200 MG capsule   Oral   Take 200 mg by mouth 3 (three) times daily as needed for cough.         Marland Kitchen buPROPion (WELLBUTRIN XL) 300 MG 24 hr tablet   Oral   Take 300 mg by mouth daily.          . carvedilol (COREG) 6.25 MG tablet   Oral   Take 6.25 mg by mouth 2 (two) times daily.          . clonazePAM (KLONOPIN) 0.5 MG tablet   Oral   Take 0.5 mg by mouth 2 (two) times daily as needed for anxiety.         . clotrimazole (LOTRIMIN) 1 % cream   Topical   Apply 1 application topically 2 (two) times daily.         . clotrimazole-betamethasone (LOTRISONE) cream    Topical   Apply 1 application topically 2 (two) times daily.         Marland Kitchen estradiol (ESTRACE) 0.1 MG/GM vaginal cream   Vaginal   Place 1 Applicatorful vaginally 3 (three) times a week. Apply a pea-sized amount with tip of finger on Monday, Wednesday, and Friday night         . FLUoxetine (PROZAC) 40 MG capsule   Oral   Take 40 mg by mouth daily.         . Fluticasone-Salmeterol (ADVAIR) 500-50 MCG/DOSE AEPB   Inhalation   Inhale 1 puff into the lungs 2 (two) times daily.         . furosemide (LASIX) 40 MG tablet   Oral   Take 80 mg by mouth daily.          . hydrocortisone cream 1 %   Topical   Apply 1 application topically 2 (two) times daily as needed for itching.         Marland Kitchen ipratropium-albuterol (DUONEB) 0.5-2.5 (3) MG/3ML SOLN   Inhalation   Inhale 3 mLs into the lungs 4 (four) times daily as needed (wheezing).          Marland Kitchen liothyronine (CYTOMEL) 25 MCG tablet   Oral   Take 25 mcg by mouth daily.          . magnesium oxide (MAG-OX) 400 MG tablet   Oral   Take 400 mg by mouth daily.          . montelukast (SINGULAIR) 10 MG tablet   Oral   Take 10 mg by mouth at bedtime.          Marland Kitchen nystatin (MYCOSTATIN/NYSTOP) 100000 UNIT/GM POWD   Topical   Apply topically 2 (two) times daily as needed (rash). 1 application         . oxyCODONE-acetaminophen (ROXICET) 5-325 MG per tablet   Oral   Take 1 tablet by mouth every 6 (six) hours as needed.   20 tablet   0   . potassium chloride (K-DUR) 10 MEQ tablet   Oral   Take 10 mEq by mouth daily.          . primidone (MYSOLINE) 50 MG tablet   Oral   Take 50 mg by mouth 2 (two) times daily.          . simvastatin (ZOCOR) 40 MG tablet   Oral   Take 40 mg by mouth at bedtime.          . solifenacin (VESICARE) 10 MG tablet   Oral   Take 10 mg  by mouth daily.         Marland Kitchen spironolactone (ALDACTONE) 100 MG tablet   Oral   Take 100 mg by mouth daily.          Marland Kitchen tiotropium (SPIRIVA) 18 MCG  inhalation capsule   Inhalation   Place 18 mcg into inhaler and inhale daily.          . traZODone (DESYREL) 50 MG tablet   Oral   Take 50 mg by mouth at bedtime as needed for sleep.         Marland Kitchen triamcinolone (KENALOG) 0.025 % cream   Topical   Apply 1 application topically as needed (itchiness).         . Vitamin D, Ergocalciferol, (DRISDOL) 50000 UNITS CAPS capsule   Oral   Take 50,000 Units by mouth every 7 (seven) days. For 8 weeks         . Vitamin D, Ergocalciferol, (DRISDOL) 50000 UNITS CAPS capsule   Oral   Take 50,000 Units by mouth every 30 (thirty) days. On the first of each month           Allergies:  Aspirin; Codeine; and Tape  Family History: Family History  Problem Relation Age of Onset  . Heart attack Father   . Coronary artery disease Father   . Hypertension Father   . Hyperlipidemia Father   . Heart attack Mother   . Hypertension Mother   . Hyperlipidemia Mother     Social History: Social History  Substance Use Topics  . Smoking status: Former Research scientist (life sciences)  . Smokeless tobacco: None  . Alcohol Use: Yes     Comment: occasional     Review of Systems:   10 point review of systems was performed and was otherwise negative:  Constitutional: No fever Eyes: No visual disturbances ENT: No sore throat, ear pain Cardiac: No current chest pain. No arm job back or flank pain. Respiratory: Mild shortness of breath, no wheezing, or stridor Abdomen: No abdominal pain, no vomiting, No diarrhea Endocrine: No weight loss, No night sweats Extremities: No peripheral edema, cyanosis Skin: No rashes, easy bruising Neurologic: No focal weakness, trouble with speech or swollowing Urologic: No dysuria, Hematuria, or urinary frequency   Physical Exam:  ED Triage Vitals  Enc Vitals Group     BP 09/14/15 1146 98/37 mmHg     Pulse Rate 09/14/15 1146 59     Resp 09/14/15 1146 20     Temp 09/14/15 1146 97.9 F (36.6 C)     Temp Source 09/14/15 1146 Oral      SpO2 09/14/15 1146 95 %     Weight 09/14/15 1147 242 lb (109.77 kg)     Height 09/14/15 1147 5\' 5"  (1.651 m)     Head Cir --      Peak Flow --      Pain Score 09/14/15 1147 9     Pain Loc --      Pain Edu? --      Excl. in [place]? --     General: Awake , Alert , and Oriented times 3; GCS 15 Head: Normal cephalic , atraumatic Eyes: Pupils equal , round, reactive to light Nose/Throat: No nasal drainage, patent upper airway without erythema or exudate.  Neck: Supple, Full range of motion, No anterior adenopathy or palpable thyroid masses Lungs: Mild end expiratory wheezing heard at the apices and clear at the bases without rhonchi, or rales Heart: Regular rate, regular rhythm without murmurs , gallops ,  or rubs Abdomen: Soft, non tender without rebound, guarding , or rigidity; bowel sounds positive and symmetric in all 4 quadrants. No organomegaly .        Extremities: 2 plus symmetric pulses. No edema, clubbing or cyanosis Neurologic: normal ambulation, Motor symmetric without deficits, sensory intact Skin: warm, dry, no rashes   Labs:   All laboratory work was reviewed including any pertinent negatives or positives listed below:  Labs Reviewed  BASIC METABOLIC PANEL - Abnormal; Notable for the following:    Chloride 100 (*)    Glucose, Bld 106 (*)    BUN 38 (*)    Creatinine, Ser 1.12 (*)    GFR calc non Af Amer 48 (*)    GFR calc Af Amer 55 (*)    All other components within normal limits  CBC - Abnormal; Notable for the following:    WBC 11.2 (*)    All other components within normal limits  TROPONIN I   review of laboratory work showed a negative troponin, serum creatinine 1.12.  EKG: ED ECG REPORT I, Daymon Larsen, the attending physician, personally viewed and interpreted this ECG.  Date: 09/14/2015 EKG Time: 1154 Rate: 63 Rhythm: normal sinus rhythm QRS Axis: normal Intervals: Right bundle-branch block ST/T Wave abnormalities: normal Conduction Disutrbances:  none Narrative Interpretation: unremarkable Minimal criteria for left ventricular hypertrophy No acute ischemic changes are noted  RadiologyEXAM: CHEST 2 VIEW  COMPARISON: 02/05/2015 and prior chest radiographs  FINDINGS: Mild cardiomegaly again noted.  Minimal bibasilar scarring again noted.  There is no evidence of focal airspace disease, pulmonary edema, suspicious pulmonary nodule/mass, pleural effusion, or pneumothorax. No acute bony abnormalities are identified.  Right shoulder hemiarthroplasty again identified.  IMPRESSION: Mild cardiomegaly without evidence of active cardiopulmonary disease   I personally reviewed the radiologic studies    ED Course: Patient's stay here was uneventful and I felt there was no acute reasons for any further studies at this time. She does not appear to have pulmonary edema on exam. She has some mild and expiratory wheezing with normal pulse ox and a wet cough that sounds as though she has bronchitis. This hasn't placed the patient on steroids and she does have inhalers and nebulizers at home and she was advised that she can give herself a breathing treatment as needed at home. She's been advised to follow up with her primary physician I did not make any adjustments to her medication. Her BUN is elevated though her creatinine level seems to be stable at this point I felt she was tolerating her additional diuretic therapy okay but she did want to check in with her primary physician to make sure that continuing the Lasix is appropriate. She was advised to take it for 5 days it 3 times a day. I don't suspect community-acquired pneumonia as she is afebrile with a negative x-ray and exam I felt antibiotics were not necessary.   Assessment:  Dyspnea with cough Acute bronchitis   Final Clinical Impression:   Final diagnoses:  Dyspnea     Plan: * Patient was advised to return immediately if condition worsens. Patient was advised to follow  up with her primary care physician or other specialized physicians involved and in their current assessment.            Daymon Larsen, MD 09/14/15 [phone]

## 2015-09-14 NOTE — ED Notes (Signed)
Gained 6 lbs, cough x 9 weeks. Saw pcp given prednisone and antibiotic. Not better per pt.

## 2015-09-14 NOTE — ED Notes (Signed)
Pt has had a junky cough for 9 weeks as well.

## 2015-09-14 NOTE — ED Notes (Signed)
Pt states she is sob and has had a 7lb weight gain in the past week, has chf, was given an extra lasix to take daily, however, feels that Lasix tid is too much. Pt states she has been wheezing the past few days as well. Wears O2 at night. sats 94% on RA at this time. States she feels "a little short winded." Denies cp.

## 2015-09-18 ENCOUNTER — Encounter: Admission: RE | Payer: Self-pay | Source: Ambulatory Visit

## 2015-09-18 ENCOUNTER — Ambulatory Visit
Admission: RE | Admit: 2015-09-18 | Payer: Medicare (Managed Care) | Source: Ambulatory Visit | Admitting: Unknown Physician Specialty

## 2015-09-18 IMAGING — CR DG CHEST 2V
1 series · 2 of 2 positions shown · non-contrast
Comparison: February 02, 2014

CLINICAL DATA: Shortness of breath

EXAM:
CHEST  2 VIEW

[Series 1: x chest ap · 0.14mm/px · 2 of 2 slices shown]
[im 1/2]
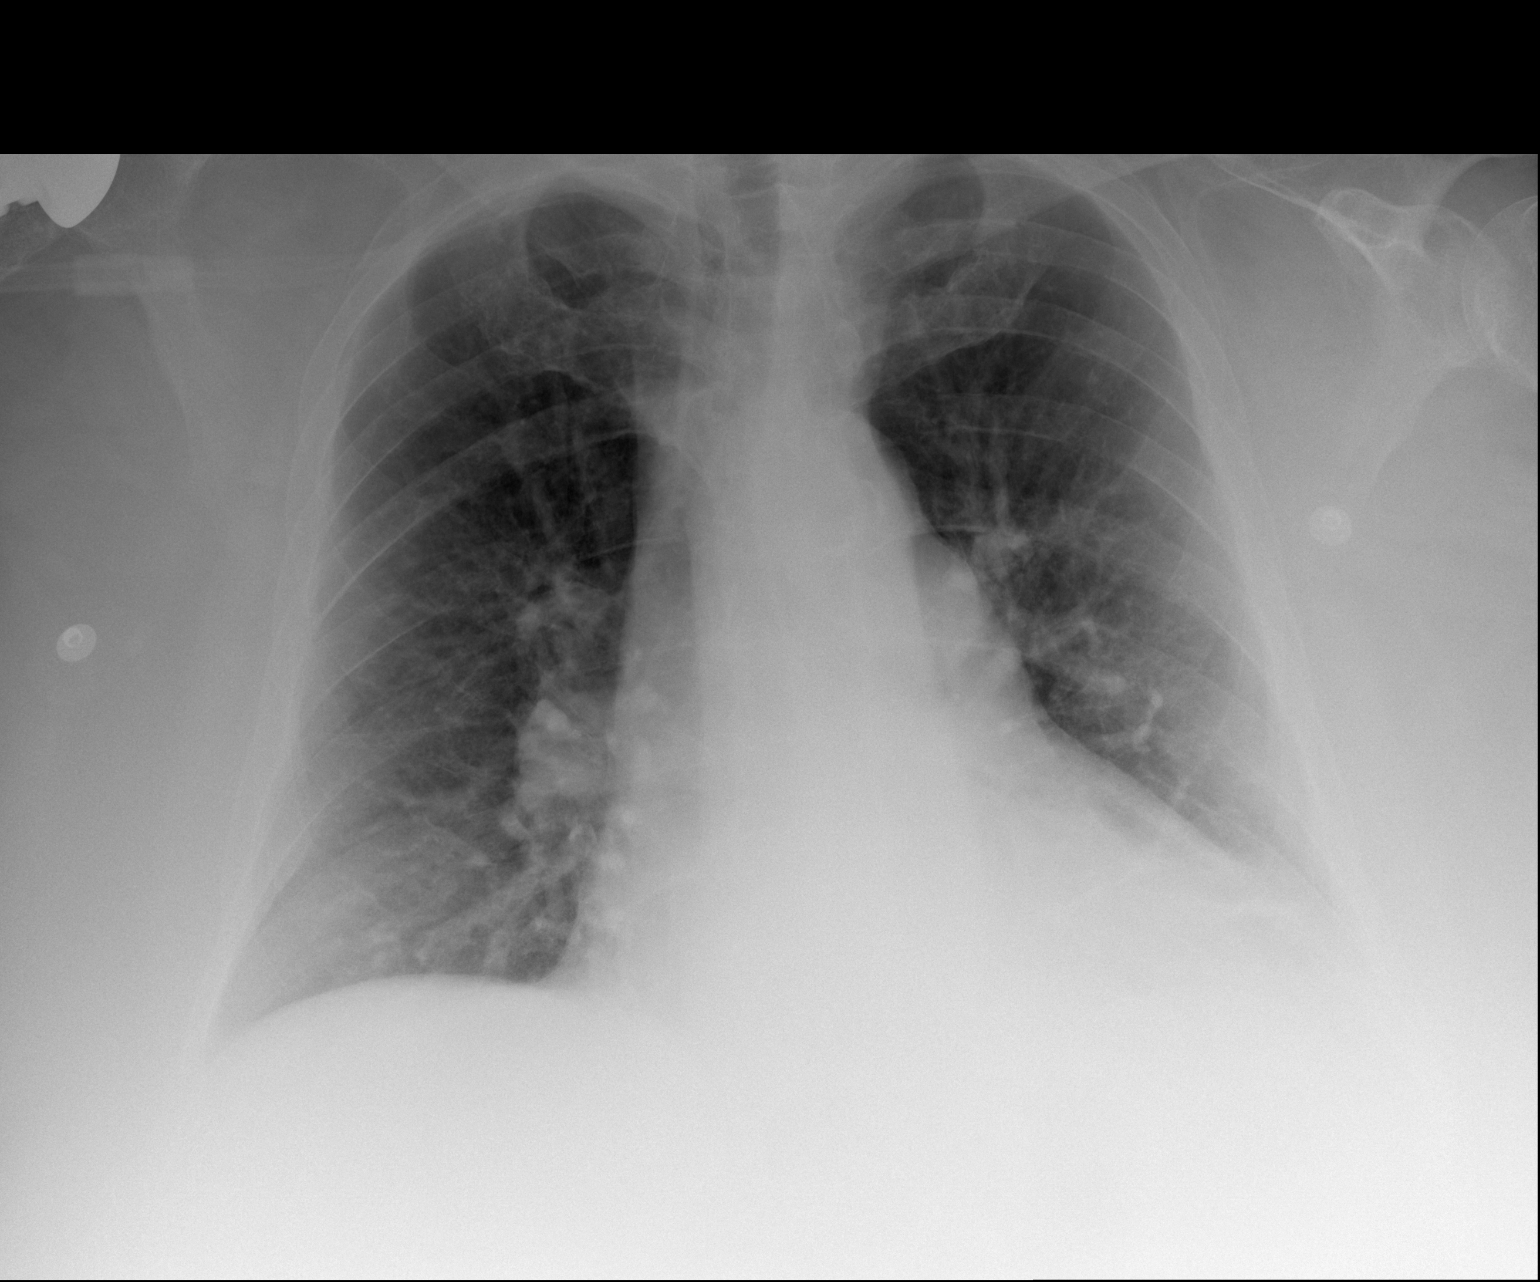
[im 2/2]
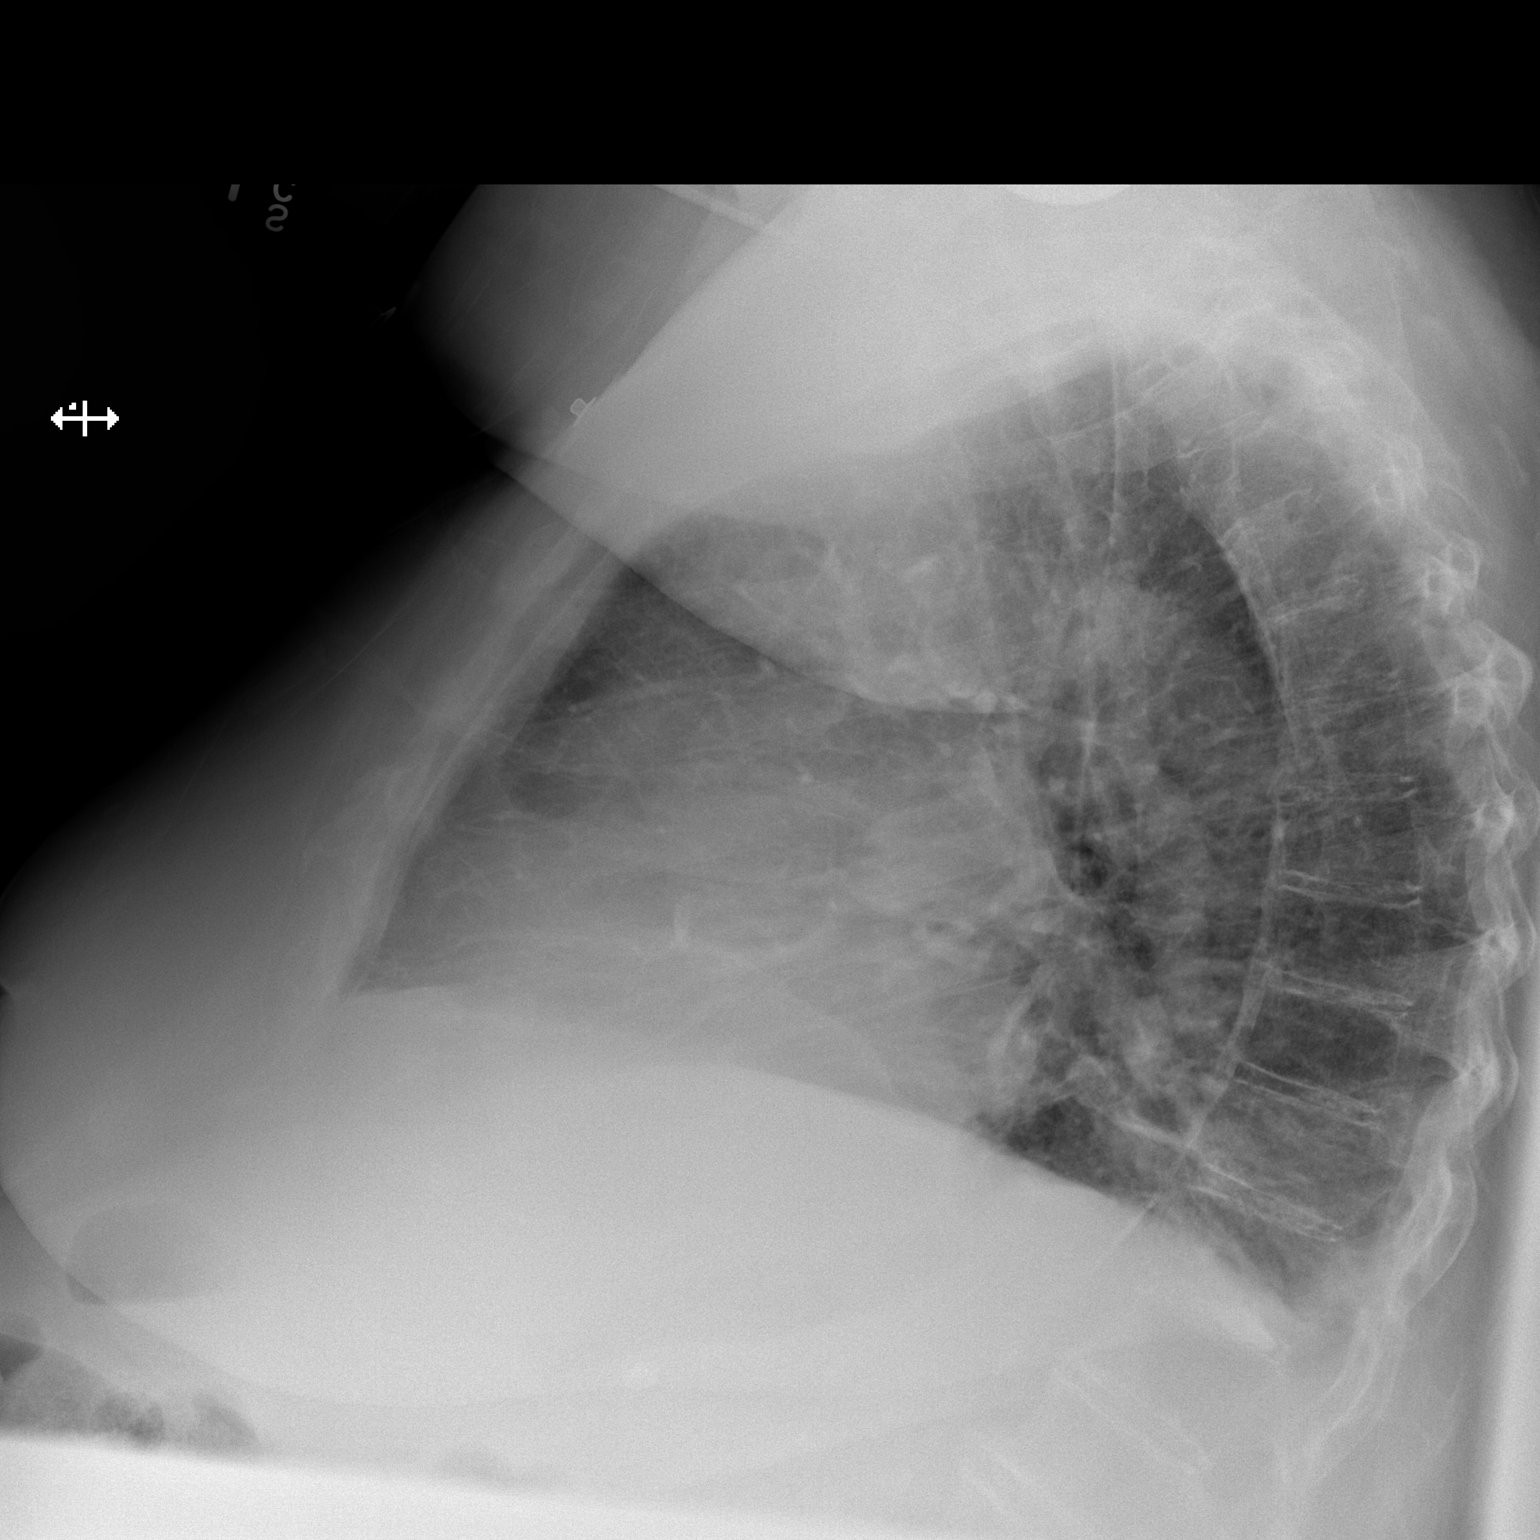

[2 of 2 positions shown; findings below may reference images not displayed]

FINDINGS: The heart size and mediastinal contours are stable. The heart size
is enlarged. Both lungs are clear. The visualized skeletal
structures are stable.
IMPRESSION: No active cardiopulmonary disease.  Cardiomegaly.

## 2015-09-18 SURGERY — COLONOSCOPY WITH PROPOFOL
Anesthesia: General

## 2015-11-15 ENCOUNTER — Encounter: Payer: Self-pay | Admitting: Emergency Medicine

## 2015-11-15 ENCOUNTER — Emergency Department: Payer: Medicare (Managed Care)

## 2015-11-15 ENCOUNTER — Inpatient Hospital Stay
Admission: EM | Admit: 2015-11-15 | Discharge: 2015-11-19 | DRG: 871 | Disposition: A | Discharge status: Home / Self Care | Payer: Medicare (Managed Care) | Attending: Internal Medicine | Admitting: Internal Medicine

## 2015-11-15 DIAGNOSIS — J441 Chronic obstructive pulmonary disease with (acute) exacerbation: Secondary | ICD-10-CM | POA: Diagnosis present

## 2015-11-15 DIAGNOSIS — Z6841 Body Mass Index (BMI) 40.0 and over, adult: Secondary | ICD-10-CM | POA: Diagnosis not present

## 2015-11-15 DIAGNOSIS — Z96611 Presence of right artificial shoulder joint: Secondary | ICD-10-CM | POA: Diagnosis present

## 2015-11-15 DIAGNOSIS — N189 Chronic kidney disease, unspecified: Secondary | ICD-10-CM | POA: Diagnosis present

## 2015-11-15 DIAGNOSIS — Z8249 Family history of ischemic heart disease and other diseases of the circulatory system: Secondary | ICD-10-CM | POA: Diagnosis not present

## 2015-11-15 DIAGNOSIS — I129 Hypertensive chronic kidney disease with stage 1 through stage 4 chronic kidney disease, or unspecified chronic kidney disease: Secondary | ICD-10-CM | POA: Diagnosis present

## 2015-11-15 DIAGNOSIS — E1122 Type 2 diabetes mellitus with diabetic chronic kidney disease: Secondary | ICD-10-CM | POA: Diagnosis present

## 2015-11-15 DIAGNOSIS — A419 Sepsis, unspecified organism: Secondary | ICD-10-CM | POA: Diagnosis present

## 2015-11-15 DIAGNOSIS — M1990 Unspecified osteoarthritis, unspecified site: Secondary | ICD-10-CM | POA: Diagnosis present

## 2015-11-15 DIAGNOSIS — J189 Pneumonia, unspecified organism: Secondary | ICD-10-CM | POA: Diagnosis present

## 2015-11-15 DIAGNOSIS — Z9981 Dependence on supplemental oxygen: Secondary | ICD-10-CM

## 2015-11-15 DIAGNOSIS — F329 Major depressive disorder, single episode, unspecified: Secondary | ICD-10-CM | POA: Diagnosis present

## 2015-11-15 DIAGNOSIS — F419 Anxiety disorder, unspecified: Secondary | ICD-10-CM | POA: Diagnosis present

## 2015-11-15 DIAGNOSIS — Z90722 Acquired absence of ovaries, bilateral: Secondary | ICD-10-CM | POA: Diagnosis not present

## 2015-11-15 DIAGNOSIS — G4730 Sleep apnea, unspecified: Secondary | ICD-10-CM | POA: Diagnosis present

## 2015-11-15 DIAGNOSIS — E785 Hyperlipidemia, unspecified: Secondary | ICD-10-CM | POA: Diagnosis present

## 2015-11-15 DIAGNOSIS — J440 Chronic obstructive pulmonary disease with acute lower respiratory infection: Secondary | ICD-10-CM | POA: Diagnosis present

## 2015-11-15 DIAGNOSIS — I429 Cardiomyopathy, unspecified: Secondary | ICD-10-CM | POA: Diagnosis present

## 2015-11-15 DIAGNOSIS — Z888 Allergy status to other drugs, medicaments and biological substances status: Secondary | ICD-10-CM | POA: Diagnosis not present

## 2015-11-15 DIAGNOSIS — E119 Type 2 diabetes mellitus without complications: Secondary | ICD-10-CM

## 2015-11-15 DIAGNOSIS — E669 Obesity, unspecified: Secondary | ICD-10-CM | POA: Diagnosis present

## 2015-11-15 DIAGNOSIS — Z87891 Personal history of nicotine dependence: Secondary | ICD-10-CM | POA: Diagnosis not present

## 2015-11-15 LAB — CBC WITH DIFFERENTIAL/PLATELET
BASOS PCT: 0 %
Basophils Absolute: 0.1 10*3/uL (ref 0–0.1)
Eosinophils Absolute: 0 10*3/uL (ref 0–0.7)
Eosinophils Relative: 0 %
HEMATOCRIT: 34.1 % — AB (ref 35.0–47.0)
HEMOGLOBIN: 11.6 g/dL — AB (ref 12.0–16.0)
LYMPHS PCT: 2 %
Lymphs Abs: 0.5 10*3/uL — ABNORMAL LOW (ref 1.0–3.6)
MCH: 31.9 pg (ref 26.0–34.0)
MCHC: 34.1 g/dL (ref 32.0–36.0)
MCV: 93.3 fL (ref 80.0–100.0)
MONO ABS: 0.9 10*3/uL (ref 0.2–0.9)
MONOS PCT: 4 %
NEUTROS ABS: 22.3 10*3/uL — AB (ref 1.4–6.5)
NEUTROS PCT: 94 %
Platelets: 197 10*3/uL (ref 150–440)
RBC: 3.66 MIL/uL — ABNORMAL LOW (ref 3.80–5.20)
RDW: 13.9 % (ref 11.5–14.5)
WBC: 23.8 10*3/uL — ABNORMAL HIGH (ref 3.6–11.0)

## 2015-11-15 LAB — URINALYSIS COMPLETE WITH MICROSCOPIC (ARMC ONLY)
Bilirubin Urine: NEGATIVE
Glucose, UA: NEGATIVE mg/dL
Ketones, ur: NEGATIVE mg/dL
NITRITE: NEGATIVE
PROTEIN: NEGATIVE mg/dL
SPECIFIC GRAVITY, URINE: 1.006 (ref 1.005–1.030)
pH: 6 (ref 5.0–8.0)

## 2015-11-15 LAB — COMPREHENSIVE METABOLIC PANEL
ALBUMIN: 4 g/dL (ref 3.5–5.0)
ALT: 14 U/L (ref 14–54)
ANION GAP: 8 (ref 5–15)
AST: 15 U/L (ref 15–41)
Alkaline Phosphatase: 93 U/L (ref 38–126)
BUN: 34 mg/dL — ABNORMAL HIGH (ref 6–20)
CALCIUM: 9.2 mg/dL (ref 8.9–10.3)
CHLORIDE: 97 mmol/L — AB (ref 101–111)
CO2: 28 mmol/L (ref 22–32)
Creatinine, Ser: 1.22 mg/dL — ABNORMAL HIGH (ref 0.44–1.00)
GFR calc non Af Amer: 43 mL/min — ABNORMAL LOW (ref >60)
GFR, EST AFRICAN AMERICAN: 50 mL/min — AB (ref >60)
GLUCOSE: 141 mg/dL — AB (ref 65–99)
POTASSIUM: 4.4 mmol/L (ref 3.5–5.1)
SODIUM: 133 mmol/L — AB (ref 135–145)
Total Bilirubin: 0.5 mg/dL (ref 0.3–1.2)
Total Protein: 6.9 g/dL (ref 6.5–8.1)

## 2015-11-15 LAB — CBC
HCT: 31.5 % — ABNORMAL LOW (ref 35.0–47.0)
Hemoglobin: 10.4 g/dL — ABNORMAL LOW (ref 12.0–16.0)
MCH: 30.9 pg (ref 26.0–34.0)
MCHC: 33 g/dL (ref 32.0–36.0)
MCV: 93.8 fL (ref 80.0–100.0)
PLATELETS: 182 10*3/uL (ref 150–440)
RBC: 3.36 MIL/uL — ABNORMAL LOW (ref 3.80–5.20)
RDW: 13.7 % (ref 11.5–14.5)
WBC: 21.4 10*3/uL — ABNORMAL HIGH (ref 3.6–11.0)

## 2015-11-15 LAB — LACTIC ACID, PLASMA: Lactic Acid, Venous: 0.8 mmol/L (ref 0.5–2.0)

## 2015-11-15 MED ORDER — POTASSIUM CHLORIDE CRYS ER 10 MEQ PO TBCR
10.0000 meq | EXTENDED_RELEASE_TABLET | Freq: Every day | ORAL | Status: DC
  Administered 2015-11-16 – 2015-11-17 (×2): 10 meq via ORAL
  Filled 2015-11-15 (×2): qty 1

## 2015-11-15 MED ORDER — ALBUTEROL SULFATE (2.5 MG/3ML) 0.083% IN NEBU
2.5000 mg | INHALATION_SOLUTION | Freq: Once | RESPIRATORY_TRACT | Status: AC
  Administered 2015-11-15: 2.5 mg via RESPIRATORY_TRACT
  Filled 2015-11-15: qty 3

## 2015-11-15 MED ORDER — MONTELUKAST SODIUM 10 MG PO TABS
10.0000 mg | ORAL_TABLET | Freq: Every day | ORAL | Status: DC
  Administered 2015-11-16 – 2015-11-18 (×4): 10 mg via ORAL
  Filled 2015-11-15 (×4): qty 1

## 2015-11-15 MED ORDER — DIPHENHYDRAMINE HCL 25 MG PO CAPS: 25.0000 mg | ORAL_CAPSULE | ORAL | Status: DC | PRN

## 2015-11-15 MED ORDER — DEXTROSE 5 % IV SOLN
1.0000 g | Freq: Once | INTRAVENOUS | Status: AC
  Administered 2015-11-15: 1 g via INTRAVENOUS
  Filled 2015-11-15: qty 10

## 2015-11-15 MED ORDER — CARVEDILOL 6.25 MG PO TABS
6.2500 mg | ORAL_TABLET | Freq: Two times a day (BID) | ORAL | Status: DC
  Administered 2015-11-16 – 2015-11-19 (×5): 6.25 mg via ORAL
  Filled 2015-11-15 (×7): qty 1

## 2015-11-15 MED ORDER — CEFTRIAXONE SODIUM 1 G IJ SOLR
1.0000 g | INTRAMUSCULAR | Status: DC
  Administered 2015-11-16: 1 g via INTRAVENOUS
  Filled 2015-11-15 (×2): qty 10

## 2015-11-15 MED ORDER — SODIUM CHLORIDE 0.9 % IV BOLUS (SEPSIS)
1000.0000 mL | INTRAVENOUS | Status: AC
  Administered 2015-11-15 (×2): 1000 mL via INTRAVENOUS

## 2015-11-15 MED ORDER — AZITHROMYCIN 500 MG IV SOLR: 500.0000 mg | INTRAVENOUS | Status: DC

## 2015-11-15 MED ORDER — GUAIFENESIN 100 MG/5ML PO SOLN
10.0000 mL | ORAL | Status: DC | PRN
  Filled 2015-11-15: qty 10

## 2015-11-15 MED ORDER — FLUOXETINE HCL 20 MG PO CAPS
40.0000 mg | ORAL_CAPSULE | Freq: Every day | ORAL | Status: DC
  Administered 2015-11-16 – 2015-11-19 (×4): 40 mg via ORAL
  Filled 2015-11-15 (×4): qty 2

## 2015-11-15 MED ORDER — CLOTRIMAZOLE 1 % EX CREA
1.0000 "application " | TOPICAL_CREAM | Freq: Two times a day (BID) | CUTANEOUS | Status: DC
  Administered 2015-11-16 – 2015-11-19 (×7): 1 via TOPICAL
  Filled 2015-11-15: qty 15

## 2015-11-15 MED ORDER — DARIFENACIN HYDROBROMIDE ER 15 MG PO TB24
15.0000 mg | ORAL_TABLET | Freq: Every day | ORAL | Status: DC
  Administered 2015-11-16 – 2015-11-19 (×4): 15 mg via ORAL
  Filled 2015-11-15 (×5): qty 1

## 2015-11-15 MED ORDER — SIMVASTATIN 40 MG PO TABS
40.0000 mg | ORAL_TABLET | Freq: Every day | ORAL | Status: DC
  Administered 2015-11-16 – 2015-11-18 (×4): 40 mg via ORAL
  Filled 2015-11-15 (×4): qty 1

## 2015-11-15 MED ORDER — SODIUM CHLORIDE 0.9 % IV SOLN
INTRAVENOUS | Status: DC
  Administered 2015-11-16 – 2015-11-17 (×4): via INTRAVENOUS

## 2015-11-15 MED ORDER — MOMETASONE FURO-FORMOTEROL FUM 200-5 MCG/ACT IN AERO
2.0000 | INHALATION_SPRAY | Freq: Two times a day (BID) | RESPIRATORY_TRACT | Status: DC
  Administered 2015-11-16 – 2015-11-19 (×8): 2 via RESPIRATORY_TRACT
  Filled 2015-11-15: qty 8.8

## 2015-11-15 MED ORDER — LIOTHYRONINE SODIUM 25 MCG PO TABS
25.0000 ug | ORAL_TABLET | Freq: Every day | ORAL | Status: DC
  Administered 2015-11-16 – 2015-11-19 (×4): 25 ug via ORAL
  Filled 2015-11-15 (×4): qty 1

## 2015-11-15 MED ORDER — ACETAMINOPHEN 325 MG PO TABS
650.0000 mg | ORAL_TABLET | Freq: Four times a day (QID) | ORAL | Status: DC | PRN
Start: 2015-11-15 — End: 2015-11-19

## 2015-11-15 MED ORDER — CLONAZEPAM 0.5 MG PO TABS
0.5000 mg | ORAL_TABLET | Freq: Two times a day (BID) | ORAL | Status: DC | PRN
  Administered 2015-11-16: 0.5 mg via ORAL
  Filled 2015-11-15: qty 1

## 2015-11-15 MED ORDER — DEXTROSE 5 % IV SOLN
500.0000 mg | Freq: Once | INTRAVENOUS | Status: AC
  Administered 2015-11-15: 500 mg via INTRAVENOUS
  Filled 2015-11-15: qty 500

## 2015-11-15 MED ORDER — SPIRONOLACTONE 100 MG PO TABS
100.0000 mg | ORAL_TABLET | Freq: Every day | ORAL | Status: DC
  Administered 2015-11-16 – 2015-11-17 (×2): 100 mg via ORAL
  Filled 2015-11-15 (×2): qty 1

## 2015-11-15 MED ORDER — OXYCODONE HCL 5 MG PO TABS
5.0000 mg | ORAL_TABLET | ORAL | Status: DC | PRN
  Administered 2015-11-17 – 2015-11-18 (×3): 5 mg via ORAL
  Filled 2015-11-15 (×3): qty 1

## 2015-11-15 MED ORDER — METHYLPREDNISOLONE SODIUM SUCC 125 MG IJ SOLR
60.0000 mg | INTRAMUSCULAR | Status: DC
  Administered 2015-11-16 (×2): 60 mg via INTRAVENOUS
  Filled 2015-11-15 (×2): qty 2

## 2015-11-15 MED ORDER — ACETAMINOPHEN 650 MG RE SUPP: 650.0000 mg | Freq: Four times a day (QID) | RECTAL | Status: DC | PRN

## 2015-11-15 MED ORDER — PRIMIDONE 50 MG PO TABS
50.0000 mg | ORAL_TABLET | Freq: Two times a day (BID) | ORAL | Status: DC
  Administered 2015-11-16 – 2015-11-19 (×8): 50 mg via ORAL
  Filled 2015-11-15 (×8): qty 1

## 2015-11-15 MED ORDER — BUPROPION HCL ER (XL) 150 MG PO TB24
300.0000 mg | ORAL_TABLET | Freq: Every day | ORAL | Status: DC
  Administered 2015-11-16 – 2015-11-19 (×4): 300 mg via ORAL
  Filled 2015-11-15 (×4): qty 2

## 2015-11-15 MED ORDER — DEXTROSE 5 % IV SOLN: 1.0000 g | INTRAVENOUS | Status: DC

## 2015-11-15 MED ORDER — DEXTROSE 5 % IV SOLN
500.0000 mg | INTRAVENOUS | Status: DC
  Administered 2015-11-16: 500 mg via INTRAVENOUS
  Filled 2015-11-15 (×2): qty 500

## 2015-11-15 MED ORDER — TIOTROPIUM BROMIDE MONOHYDRATE 18 MCG IN CAPS
18.0000 ug | ORAL_CAPSULE | Freq: Every day | RESPIRATORY_TRACT | Status: DC
  Administered 2015-11-16 – 2015-11-19 (×4): 18 ug via RESPIRATORY_TRACT
  Filled 2015-11-15: qty 5

## 2015-11-15 MED ORDER — TRAZODONE HCL 50 MG PO TABS
50.0000 mg | ORAL_TABLET | Freq: Every evening | ORAL | Status: DC | PRN
  Administered 2015-11-17 – 2015-11-18 (×2): 50 mg via ORAL
  Filled 2015-11-15 (×2): qty 1

## 2015-11-15 MED ORDER — ONDANSETRON HCL 4 MG/2ML IJ SOLN: 4.0000 mg | Freq: Four times a day (QID) | INTRAMUSCULAR | Status: DC | PRN

## 2015-11-15 MED ORDER — SODIUM CHLORIDE 0.9 % IV BOLUS (SEPSIS)
500.0000 mL | INTRAVENOUS | Status: AC
  Administered 2015-11-15: 500 mL via INTRAVENOUS

## 2015-11-15 MED ORDER — BENZONATATE 100 MG PO CAPS
200.0000 mg | ORAL_CAPSULE | Freq: Three times a day (TID) | ORAL | Status: DC | PRN
  Administered 2015-11-16 – 2015-11-18 (×3): 200 mg via ORAL
  Filled 2015-11-15 (×3): qty 2

## 2015-11-15 MED ORDER — HEPARIN SODIUM (PORCINE) 5000 UNIT/ML IJ SOLN
5000.0000 [IU] | Freq: Three times a day (TID) | INTRAMUSCULAR | Status: DC
  Administered 2015-11-16 (×2): 5000 [IU] via SUBCUTANEOUS
  Filled 2015-11-15 (×2): qty 1

## 2015-11-15 MED ORDER — ONDANSETRON HCL 4 MG PO TABS: 4.0000 mg | ORAL_TABLET | Freq: Four times a day (QID) | ORAL | Status: DC | PRN

## 2015-11-15 MED ORDER — IPRATROPIUM-ALBUTEROL 0.5-2.5 (3) MG/3ML IN SOLN: 3.0000 mL | RESPIRATORY_TRACT | Status: DC | PRN

## 2015-11-15 MED ORDER — MAGNESIUM OXIDE 400 (241.3 MG) MG PO TABS
400.0000 mg | ORAL_TABLET | Freq: Every day | ORAL | Status: DC
  Administered 2015-11-16 – 2015-11-19 (×4): 400 mg via ORAL
  Filled 2015-11-15 (×5): qty 1
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
## 2015-11-15 NOTE — Progress Notes (Signed)
ANTIBIOTIC CONSULT NOTE - INITIAL  Pharmacy Consult for ceftriaxone and azithromycin Indication: pneumonia  Allergies  Allergen Reactions  . Aspirin Swelling  . Codeine Itching  . Tape Hives    Patient Measurements: Height: 5\' 5"  (165.1 cm) Weight: 241 lb (109.317 kg) IBW/kg (Calculated) : 57 Adjusted Body Weight: n/a  Vital Signs: Temp: 98.2 F (36.8 C) (12/03 2343) Temp Source: Oral (12/03 2343) BP: 92/39 mmHg (12/03 2343) Pulse Rate: 65 (12/03 2343) Intake/Output from previous day:   Intake/Output from this shift:    Labs:  Recent Labs  11/15/15 2046  WBC 23.8*  HGB 11.6*  PLT 197  CREATININE 1.22*   Estimated Creatinine Clearance: 51.3 mL/min (by C-G formula based on Cr of 1.22). No results for input(s): VANCOTROUGH, VANCOPEAK, VANCORANDOM, GENTTROUGH, GENTPEAK, GENTRANDOM, TOBRATROUGH, TOBRAPEAK, TOBRARND, AMIKACINPEAK, AMIKACINTROU, AMIKACIN in the last 72 hours.   Microbiology: No results found for this or any previous visit (from the past 720 hour(s)).  Medical History: Past Medical History  Diagnosis Date  . COPD (chronic obstructive pulmonary disease) ([place])   . Clotting disorder ([place])   . Chronic kidney disease   . Depression   . Cardiomyopathy ([place])   . Arthritis   . Cataract   . Cerebral hemorrhage ([place])   . Diabetes mellitus without complication ([place])   . Hypertension   . Obesity   . Sleep apnea   . Urinary frequency   . Vaginal atrophy   . Varicose veins   . Mixed incontinence   . Thyroid disease     Medications:   Assessment: Blood and urine cx pending UA: LE(+) NO2(-) WBC 6-30 CXR: LLL atelectasis vs. pneumonia  Goal of Therapy:  Resolution of infection  Plan:  Ceftriaxone 1 gram and azithromycin 500 mg IV q 24 hours ordered.  Blaze Nylund S 11/15/2015,11:56 PM
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
## 2015-11-15 NOTE — H&P (Signed)
[facility] NAME: Sara Pierce    MR#:  [id number]  DATE OF BIRTH:  June 22, 1941   DATE OF ADMISSION:  11/15/2015  PRIMARY CARE PHYSICIAN: Talbert Cage, FNP   REQUESTING/REFERRING PHYSICIAN: [place]  CHIEF COMPLAINT:   Chief Complaint  Patient presents with  . Weakness    HISTORY OF PRESENT ILLNESS:  Sara Pierce  is a 74 y.o. female with a known history of COPD oxygen dependent 3 L at nighttime only presenting with shortness of breath and cough She describes three-day duration of worsening cough currently nonproductive with associated subjective fevers and chills. Denies any chest pain or further symptomatology. She states she is short of breath with exertion but surprisingly not far from usual. In the emergency department noted to have mild fever he tachypneic with leukocytosis code sepsis initiated  PAST MEDICAL HISTORY:   Past Medical History  Diagnosis Date  . COPD (chronic obstructive pulmonary disease) ([place])   . Clotting disorder ([place])   . Chronic kidney disease   . Depression   . Cardiomyopathy ([place])   . Arthritis   . Cataract   . Cerebral hemorrhage ([place])   . Diabetes mellitus without complication ([place])   . Hypertension   . Obesity   . Sleep apnea   . Urinary frequency   . Vaginal atrophy   . Varicose veins   . Mixed incontinence   . Thyroid disease     PAST SURGICAL HISTORY:   Past Surgical History  Procedure Laterality Date  . Appendectomy    . Cardiac catherization    . Pacemaker insertion    . Joint replacement    . Bilateral oophorectomy    . Spine surgery    . Strabismus surgery    . Tonsillectomy    . Tubal ligation      SOCIAL HISTORY:   Social History  Substance Use Topics  . Smoking status: Former Research scientist (life sciences)  . Smokeless tobacco: Not on file  . Alcohol Use: Yes     Comment: occasional    FAMILY HISTORY:   Family History  Problem Relation Age of Onset  . Heart  attack Father   . Coronary artery disease Father   . Hypertension Father   . Hyperlipidemia Father   . Heart attack Mother   . Hypertension Mother   . Hyperlipidemia Mother     DRUG ALLERGIES:   Allergies  Allergen Reactions  . Aspirin Swelling  . Codeine Itching  . Tape Hives    REVIEW OF SYSTEMS:  REVIEW OF SYSTEMS:  CONSTITUTIONAL: Positive fevers, chills, fatigue, weakness.  EYES: Denies blurred vision, double vision, or eye pain.  EARS, NOSE, THROAT: Denies tinnitus, ear pain, hearing loss.  RESPIRATORY: Positive cough, shortness of breath, denies wheezing  CARDIOVASCULAR: Denies chest pain, palpitations, edema.  GASTROINTESTINAL: Denies nausea, vomiting, diarrhea, abdominal pain.  GENITOURINARY: Denies dysuria, hematuria.  ENDOCRINE: Denies nocturia or thyroid problems. HEMATOLOGIC AND LYMPHATIC: Denies easy bruising or bleeding.  SKIN: Denies rash or lesions.  MUSCULOSKELETAL: Denies pain in neck, back, shoulder, knees, hips, or further arthritic symptoms.  NEUROLOGIC: Denies paralysis, paresthesias.  PSYCHIATRIC: Denies anxiety or depressive symptoms. Otherwise full review of systems performed by me is negative.   MEDICATIONS AT HOME:   Prior to Admission medications   Medication Sig Start Date End Date Taking? Authorizing Provider  albuterol (PROVENTIL HFA;VENTOLIN HFA) 108 (90 BASE) MCG/ACT inhaler Inhale 2 puffs into the lungs every 6 (six) hours  as needed for wheezing.    Yes Historical Provider, MD  benzonatate (TESSALON) 200 MG capsule Take 200 mg by mouth 3 (three) times daily as needed for cough.   Yes Historical Provider, MD  buPROPion (WELLBUTRIN XL) 300 MG 24 hr tablet Take 300 mg by mouth daily.    Yes Historical Provider, MD  carvedilol (COREG) 6.25 MG tablet Take 6.25 mg by mouth 2 (two) times daily.    Yes Historical Provider, MD  clonazePAM (KLONOPIN) 0.5 MG tablet Take 0.5 mg by mouth 2 (two) times daily as needed for anxiety.   Yes Historical  Provider, MD  clotrimazole (LOTRIMIN) 1 % cream Apply 1 application topically 2 (two) times daily.   Yes Historical Provider, MD  clotrimazole-betamethasone (LOTRISONE) cream Apply 1 application topically 2 (two) times daily.   Yes Historical Provider, MD  estradiol (ESTRACE) 0.1 MG/GM vaginal cream Place 1 Applicatorful vaginally 3 (three) times a week. Apply a pea-sized amount with tip of finger on Monday, Wednesday, and Friday night   Yes Historical Provider, MD  FLUoxetine (PROZAC) 40 MG capsule Take 40 mg by mouth daily.   Yes Historical Provider, MD  Fluticasone-Salmeterol (ADVAIR) 500-50 MCG/DOSE AEPB Inhale 1 puff into the lungs 2 (two) times daily.   Yes Historical Provider, MD  furosemide (LASIX) 40 MG tablet Take 80 mg by mouth daily.    Yes Historical Provider, MD  hydrocortisone cream 1 % Apply 1 application topically 2 (two) times daily as needed for itching.   Yes Historical Provider, MD  ipratropium-albuterol (DUONEB) 0.5-2.5 (3) MG/3ML SOLN Inhale 3 mLs into the lungs 4 (four) times daily as needed (wheezing).    Yes Historical Provider, MD  liothyronine (CYTOMEL) 25 MCG tablet Take 25 mcg by mouth daily.    Yes Historical Provider, MD  magnesium oxide (MAG-OX) 400 MG tablet Take 400 mg by mouth daily.    Yes Historical Provider, MD  montelukast (SINGULAIR) 10 MG tablet Take 10 mg by mouth at bedtime.    Yes Historical Provider, MD  nystatin (MYCOSTATIN/NYSTOP) 100000 UNIT/GM POWD Apply topically 2 (two) times daily as needed (rash). 1 application   Yes Historical Provider, MD  potassium chloride (K-DUR) 10 MEQ tablet Take 10 mEq by mouth daily.    Yes Historical Provider, MD  primidone (MYSOLINE) 50 MG tablet Take 50 mg by mouth 2 (two) times daily.    Yes Historical Provider, MD  simvastatin (ZOCOR) 40 MG tablet Take 40 mg by mouth at bedtime.    Yes Historical Provider, MD  solifenacin (VESICARE) 10 MG tablet Take 10 mg by mouth daily.   Yes Historical Provider, MD   spironolactone (ALDACTONE) 100 MG tablet Take 100 mg by mouth daily.    Yes Historical Provider, MD  tiotropium (SPIRIVA) 18 MCG inhalation capsule Place 18 mcg into inhaler and inhale daily.    Yes Historical Provider, MD  traZODone (DESYREL) 50 MG tablet Take 50 mg by mouth at bedtime as needed for sleep.   Yes Historical Provider, MD  triamcinolone (KENALOG) 0.025 % cream Apply 1 application topically as needed (itchiness).   Yes Historical Provider, MD  Vitamin D, Ergocalciferol, (DRISDOL) 50000 UNITS CAPS capsule Take 50,000 Units by mouth every 7 (seven) days. For 8 weeks 04/24/15  Yes Historical Provider, MD  Vitamin D, Ergocalciferol, (DRISDOL) 50000 UNITS CAPS capsule Take 50,000 Units by mouth every 30 (thirty) days. On the first of each month   Yes Historical Provider, MD  oxyCODONE-acetaminophen (ROXICET) 5-325 MG per tablet Take  1 tablet by mouth every 6 (six) hours as needed. 06/05/15   Earleen Newport, MD      VITAL SIGNS:  Blood pressure 92/72, pulse 66, temperature 100.6 F (38.1 C), resp. rate 21, height 5\' 5"  (1.651 m), weight 241 lb (109.317 kg), SpO2 91 %.  PHYSICAL EXAMINATION:  VITAL SIGNS: Filed Vitals:   11/15/15 2030 11/15/15 2202  BP: 99/79 92/72  Pulse:  66  Temp:    Resp: 21    GENERAL:74 y.o.female currently in no acute distress. Though actively coughing  HEAD: Normocephalic, atraumatic.  EYES: Pupils equal, round, reactive to light. Extraocular muscles intact. No scleral icterus.  MOUTH: Moist mucosal membrane. Dentition intact. No abscess noted.  EAR, NOSE, THROAT: Clear without exudates. No external lesions.  NECK: Supple. No thyromegaly. No nodules. No JVD.  PULMONARY: Coarse breath sounds throughout with scattered rhonchi diminished breath sounds throughout all lung fields, tachypneic without  use of accessory muscles, Good respiratory effort. good air entry bilaterally CHEST: Nontender to palpation.  CARDIOVASCULAR: S1 and S2. Regular rate and  rhythm. No murmurs, rubs, or gallops. No edema. Pedal pulses 2+ bilaterally.  GASTROINTESTINAL: Soft, nontender, nondistended. No masses. Positive bowel sounds. No hepatosplenomegaly.  MUSCULOSKELETAL: No swelling, clubbing, or edema. Range of motion full in all extremities.  NEUROLOGIC: Cranial nerves II through XII are intact. No gross focal neurological deficits. Sensation intact. Reflexes intact.  SKIN: No ulceration, lesions, rashes, or cyanosis. Skin warm and dry. Turgor intact.  PSYCHIATRIC: Mood, affect within normal limits. The patient is awake, alert and oriented x 3. Insight, judgment intact.    LABORATORY PANEL:   CBC  Recent Labs Lab 11/15/15 2046  WBC 23.8*  HGB 11.6*  HCT 34.1*  PLT 197   ------------------------------------------------------------------------------------------------------------------  Chemistries   Recent Labs Lab 11/15/15 2046  NA 133*  K 4.4  CL 97*  CO2 28  GLUCOSE 141*  BUN 34*  CREATININE 1.22*  CALCIUM 9.2  AST 15  ALT 14  ALKPHOS 93  BILITOT 0.5   ------------------------------------------------------------------------------------------------------------------  Cardiac Enzymes No results for input(s): TROPONINI in the last 168 hours. ------------------------------------------------------------------------------------------------------------------  RADIOLOGY:  Dg Chest 2 View  11/15/2015  CLINICAL DATA:  Productive cough for the past 3-4 days. EXAM: CHEST  2 VIEW COMPARISON:  09/14/2015. FINDINGS: The cardiac silhouette remains mildly enlarged. Clear lungs with normal vascularity. The interstitial markings are mildly prominent. Interval small amount of airspace opacity in the posterior aspect of the left lower lobe, medially. 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 Diffuse osteopenia. IMPRESSION: 1. Left lower lobe atelectasis or pneumonia. 2. Mild chronic  interstitial lung disease. Electronically Signed   By: Claudie Revering M.D.   On: 11/15/2015 21:46    EKG:   Orders placed or performed during the hospital encounter of 09/14/15  . ED EKG within 10 minutes  . ED EKG within 10 minutes  . EKG 12-Lead  . EKG 12-Lead  . EKG    IMPRESSION AND PLAN:   74 year old Caucasian female history of COPD oxygen requiring 3 L at nighttime presenting with cough and fever  1.Sepsis, meeting septic criteria by leukocytosis, respiratory rate present on arrival. Source community acquired pneumonia Code sepsis initiated. Panculture. Broad-spectrum antibiotics including ceftriaxone/azithromycin and taper antibiotics when culture data returns. He has received a 30 mL/kg IV fluid bolus. Continue IV fluid hydration to keep mean arterial pressure greater than 65. He may require pressor therapy if blood pressure  worsens. We will repeat lactic acid given if the initial is greater than 2.2. Add DuoNeb treatments also add Solu-Medrol given COPD history, continue home medications for breathing including Spiriva 2. Hyperlipidemia unspecified: Statin therapy 3. Essential hypertension: Coreg, Aldactone 4. Venous thromboembolism prophylactic: Heparin subcutaneous     All the records are reviewed and case discussed with ED provider. Management plans discussed with the patient, family and they are in agreement.  CODE STATUS: Full  TOTAL TIME TAKING CARE OF THIS PATIENT: 35 minutes.    Ceci Taliaferro,  Karenann Cai.D on 11/15/2015 at 10:42 PM  Between 7am to 6pm - Pager - [phone]  After 6pm: House Pager: - [phone]  [facility]  Office  [phone]  CC: Primary care physician; Talbert Cage, FNP

## 2015-11-15 NOTE — ED Provider Notes (Signed)
[facility] Emergency Department Provider Note  ____________________________________________  Time seen: Seen upon arrival to the emergency department  I have reviewed the triage vital signs and the nursing notes.   HISTORY  Chief Complaint Weakness    HPI Sara Pierce is a 74 y.o. female with a history of COPD and chronic kidney disease who is on baseline 3 L home oxygen who is presenting today with shortness of breath and a cough. She is also complaining of diffuse weakness over the past day. Denies any pain at this time.   Past Medical History  Diagnosis Date  . COPD (chronic obstructive pulmonary disease) ([place])   . Clotting disorder ([place])   . Chronic kidney disease   . Depression   . Cardiomyopathy ([place])   . Arthritis   . Cataract   . Cerebral hemorrhage ([place])   . Diabetes mellitus without complication ([place])   . Hypertension   . Obesity   . Sleep apnea   . Urinary frequency   . Vaginal atrophy   . Varicose veins   . Mixed incontinence   . Thyroid disease     Patient Active Problem List   Diagnosis Date Noted  . Sepsis ([place]) 11/15/2015  . Community acquired pneumonia 11/15/2015  . Absolute anemia 03/15/2015  . Anxiety 03/15/2015  . Arthritis 03/15/2015  . Cardiomyopathy, secondary ([place]) 03/15/2015  . Cataract 03/15/2015  . Brain bleed ([place]) 03/15/2015  . Chronic constipation 03/15/2015  . Chronic kidney failure 03/15/2015  . Blood clotting disorder ([place]) 03/15/2015  . CAFL (chronic airflow limitation) ([place]) 03/15/2015  . Clinical depression 03/15/2015  . Diabetes ([place]) 03/15/2015  . H/O varicella 03/15/2015  . BP (high blood pressure) 03/15/2015  . Mixed incontinence 03/15/2015  . 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 Adiposity 03/15/2015  . Apnea, sleep 03/15/2015  . Disease of thyroid gland 03/15/2015  . FOM (frequency of micturition) 03/15/2015  . 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  Candida vaginitis 03/15/2015  . Abnormal ECG 03/15/2015    Past Surgical History  Procedure Laterality Date  . Appendectomy    . Cardiac catherization    . Pacemaker insertion    . Joint replacement    . Bilateral oophorectomy    . Spine surgery    . Strabismus surgery    . Tonsillectomy    . Tubal ligation      Current Outpatient Rx  Name  Route  Sig  Dispense  Refill  . albuterol (PROVENTIL HFA;VENTOLIN HFA) 108 (90 BASE) MCG/ACT inhaler   Inhalation   Inhale 2 puffs into the lungs every 6 (six) hours as needed for wheezing.          . benzonatate (TESSALON) 200 MG capsule   Oral   Take 200 mg by mouth 3 (three) times daily as needed for cough.         Marland Kitchen buPROPion (WELLBUTRIN XL) 300 MG 24 hr tablet   Oral   Take 300 mg by mouth daily.          . carvedilol (COREG) 6.25 MG tablet   Oral   Take 6.25 mg by mouth 2 (two) times daily.          . clonazePAM (KLONOPIN) 0.5 MG tablet   Oral   Take 0.5 mg by mouth 2 (two) times daily as needed for anxiety.         . clotrimazole (LOTRIMIN) 1 % cream   Topical  Apply 1 application topically 2 (two) times daily.         . clotrimazole-betamethasone (LOTRISONE) cream   Topical   Apply 1 application topically 2 (two) times daily.         Marland Kitchen estradiol (ESTRACE) 0.1 MG/GM vaginal cream   Vaginal   Place 1 Applicatorful vaginally 3 (three) times a week. Apply a pea-sized amount with tip of finger on Monday, Wednesday, and Friday night         . FLUoxetine (PROZAC) 40 MG capsule   Oral   Take 40 mg by mouth daily.         . Fluticasone-Salmeterol (ADVAIR) 500-50 MCG/DOSE AEPB   Inhalation   Inhale 1 puff into the lungs 2 (two) times daily.         . furosemide (LASIX) 40 MG tablet   Oral   Take 80 mg by mouth daily.          . hydrocortisone cream 1 %   Topical   Apply 1 application topically 2 (two) times daily as needed for itching.         Marland Kitchen ipratropium-albuterol (DUONEB) 0.5-2.5 (3)  MG/3ML SOLN   Inhalation   Inhale 3 mLs into the lungs 4 (four) times daily as needed (wheezing).          Marland Kitchen liothyronine (CYTOMEL) 25 MCG tablet   Oral   Take 25 mcg by mouth daily.          . magnesium oxide (MAG-OX) 400 MG tablet   Oral   Take 400 mg by mouth daily.          . montelukast (SINGULAIR) 10 MG tablet   Oral   Take 10 mg by mouth at bedtime.          Marland Kitchen nystatin (MYCOSTATIN/NYSTOP) 100000 UNIT/GM POWD   Topical   Apply topically 2 (two) times daily as needed (rash). 1 application         . potassium chloride (K-DUR) 10 MEQ tablet   Oral   Take 10 mEq by mouth daily.          . primidone (MYSOLINE) 50 MG tablet   Oral   Take 50 mg by mouth 2 (two) times daily.          . simvastatin (ZOCOR) 40 MG tablet   Oral   Take 40 mg by mouth at bedtime.          . solifenacin (VESICARE) 10 MG tablet   Oral   Take 10 mg by mouth daily.         Marland Kitchen spironolactone (ALDACTONE) 100 MG tablet   Oral   Take 100 mg by mouth daily.          Marland Kitchen tiotropium (SPIRIVA) 18 MCG inhalation capsule   Inhalation   Place 18 mcg into inhaler and inhale daily.          . traZODone (DESYREL) 50 MG tablet   Oral   Take 50 mg by mouth at bedtime as needed for sleep.         Marland Kitchen triamcinolone (KENALOG) 0.025 % cream   Topical   Apply 1 application topically as needed (itchiness).         . Vitamin D, Ergocalciferol, (DRISDOL) 50000 UNITS CAPS capsule   Oral   Take 50,000 Units by mouth every 7 (seven) days. For 8 weeks         . Vitamin D, Ergocalciferol, (DRISDOL) 50000 UNITS CAPS  capsule   Oral   Take 50,000 Units by mouth every 30 (thirty) days. On the first of each month         . oxyCODONE-acetaminophen (ROXICET) 5-325 MG per tablet   Oral   Take 1 tablet by mouth every 6 (six) hours as needed.   20 tablet   0     Allergies Aspirin; Codeine; and Tape  Family History  Problem Relation Age of Onset  . Heart attack Father   . Coronary  artery disease Father   . Hypertension Father   . Hyperlipidemia Father   . Heart attack Mother   . Hypertension Mother   . Hyperlipidemia Mother     Social History Social History  Substance Use Topics  . Smoking status: Former Research scientist (life sciences)  . Smokeless tobacco: None  . Alcohol Use: Yes     Comment: occasional    Review of Systems Constitutional: Fever  Eyes: No visual changes. ENT: No sore throat. Cardiovascular: Denies chest pain. Respiratory: Cough  Gastrointestinal: No abdominal pain.  No nausea, no vomiting.  No diarrhea.  No constipation. Genitourinary: Negative for dysuria. Musculoskeletal: Negative for back pain. Skin: Negative for rash. Neurological: Negative for headaches, focal weakness or numbness.  10-point ROS otherwise negative.  ____________________________________________   PHYSICAL EXAM:  VITAL SIGNS: ED Triage Vitals  Enc Vitals Group     BP 11/15/15 2019 99/79 mmHg     Pulse Rate 11/15/15 2019 80     Resp 11/15/15 2019 24     Temp 11/15/15 2019 100.6 F (38.1 C)     Temp src --      SpO2 11/15/15 2019 94 %     Weight 11/15/15 2019 241 lb (109.317 kg)     Height 11/15/15 2019 5\' 5"  (1.651 m)     Head Cir --      Peak Flow --      Pain Score 11/15/15 2030 2     Pain Loc --      Pain Edu? --      Excl. in [place]? --     Constitutional: Alert and oriented. Well appearing and in no acute distress. Eyes: Conjunctivae are normal. PERRL. EOMI. Head: Atraumatic. Nose: No congestion/rhinnorhea. Mouth/Throat: Mucous membranes are moist.  Oropharynx non-erythematous. Neck: No stridor.   Cardiovascular: Normal rate, regular rhythm. Grossly normal heart sounds.  Good peripheral circulation. Respiratory: Normal respiratory effort.  No retractions. Rales to the left base. Gastrointestinal: Soft and nontender. No distention. No abdominal bruits. No CVA tenderness. Musculoskeletal: Mild to moderate bilateral lower extremity pitting edema.  No joint  effusions. Neurologic:  Normal speech and language. No gross focal neurologic deficits are appreciated. No gait instability. Skin:  Skin is warm, dry and intact. No rash noted. Psychiatric: Mood and affect are normal. Speech and behavior are normal.  ____________________________________________   LABS (all labs ordered are listed, but only abnormal results are displayed)  Labs Reviewed  COMPREHENSIVE METABOLIC PANEL - Abnormal; Notable for the following:    Sodium 133 (*)    Chloride 97 (*)    Glucose, Bld 141 (*)    BUN 34 (*)    Creatinine, Ser 1.22 (*)    GFR calc non Af Amer 43 (*)    GFR calc Af Amer 50 (*)    All other components within normal limits  CBC WITH DIFFERENTIAL/PLATELET - Abnormal; Notable for the following:    WBC 23.8 (*)    RBC 3.66 (*)    Hemoglobin 11.6 (*)  HCT 34.1 (*)    Neutro Abs 22.3 (*)    Lymphs Abs 0.5 (*)    All other components within normal limits  URINALYSIS COMPLETEWITH MICROSCOPIC ([facility] ONLY) - Abnormal; Notable for the following:    Color, Urine YELLOW (*)    APPearance CLEAR (*)    Hgb urine dipstick 3+ (*)    Leukocytes, UA 1+ (*)    Bacteria, UA RARE (*)    Squamous Epithelial / LPF 0-5 (*)    All other components within normal limits  CULTURE, BLOOD (ROUTINE X 2)  CULTURE, BLOOD (ROUTINE X 2)  URINE CULTURE  LACTIC ACID, PLASMA  LACTIC ACID, PLASMA  CBC  BASIC METABOLIC PANEL   ____________________________________________  EKG  ED ECG REPORT I, Doran Stabler, the attending physician, personally viewed and interpreted this ECG.   Date: 11/15/2015  EKG Time: 2025  Rate: 69  Rhythm: atrial fibrillation, rate 69. Unclear of atrial fibrillation. Appears to have P waves and rhythm strip.  Axis: Oval axis  Intervals:right bundle branch block  ST&T Change: No ST segment elevation or depression. No abnormal T-wave inversions.  Unchangeable bundle blanch block from  previous. ____________________________________________  RADIOLOGY  Left lower lobe atelectasis versus pneumonia. ____________________________________________   PROCEDURES  CRITICAL CARE Performed by: Doran Stabler   Total critical care time: 35 minutes  Critical care time was exclusive of separately billable procedures and treating other patients.  Critical care was necessary to treat or prevent imminent or life-threatening deterioration.  Critical care was time spent personally by me on the following activities: development of treatment plan with patient and/or surrogate as well as nursing, discussions with consultants, evaluation of patient's response to treatment, examination of patient, obtaining history from patient or surrogate, ordering and performing treatments and interventions, ordering and review of laboratory studies, ordering and review of radiographic studies, pulse oximetry and re-evaluation of patient's condition.   ____________________________________________   INITIAL IMPRESSION / ASSESSMENT AND PLAN / ED COURSE  Pertinent labs & imaging results that were available during my care of the patient were reviewed by me and considered in my medical decision making (see chart for details).  ----------------------------------------- 10:46 PM on 11/15/2015 -----------------------------------------  Patient resting comfortable at this time but with blood pressures in the 90s. 92% on 3 L nasal cannula. Patient meets SIRS criteria with elevated white count, tachypnea as well as fever. Will admit to the hospital for pneumonia treatment. Patient has a community-acquired pneumonia. Has not been admitted to the hospital since this past February. Lives independently. Signed out to Dr. Lavetta Nielsen. Plan the patient understands and is willing to comply. ____________________________________________   FINAL CLINICAL IMPRESSION(S) / ED DIAGNOSES  Acute community-acquired  pneumonia.    Orbie Pyo, MD 11/15/15 [phone]

## 2015-11-16 LAB — BASIC METABOLIC PANEL
ANION GAP: 6 (ref 5–15)
BUN: 31 mg/dL — ABNORMAL HIGH (ref 6–20)
CHLORIDE: 100 mmol/L — AB (ref 101–111)
CO2: 26 mmol/L (ref 22–32)
Calcium: 8.4 mg/dL — ABNORMAL LOW (ref 8.9–10.3)
Creatinine, Ser: 1.04 mg/dL — ABNORMAL HIGH (ref 0.44–1.00)
GFR calc non Af Amer: 52 mL/min — ABNORMAL LOW (ref >60)
Glucose, Bld: 151 mg/dL — ABNORMAL HIGH (ref 65–99)
Potassium: 4.2 mmol/L (ref 3.5–5.1)
Sodium: 132 mmol/L — ABNORMAL LOW (ref 135–145)

## 2015-11-16 LAB — LACTIC ACID, PLASMA: Lactic Acid, Venous: 1 mmol/L (ref 0.5–2.0)

## 2015-11-16 MED ORDER — ENOXAPARIN SODIUM 40 MG/0.4ML ~~LOC~~ SOLN
40.0000 mg | Freq: Two times a day (BID) | SUBCUTANEOUS | Status: DC
  Administered 2015-11-16 – 2015-11-19 (×7): 40 mg via SUBCUTANEOUS
  Filled 2015-11-16 (×7): qty 0.4

## 2015-11-16 MED ORDER — GUAIFENESIN ER 600 MG PO TB12
600.0000 mg | ORAL_TABLET | Freq: Two times a day (BID) | ORAL | Status: DC
  Administered 2015-11-16 – 2015-11-19 (×7): 600 mg via ORAL
  Filled 2015-11-16 (×7): qty 1

## 2015-11-16 MED ORDER — MENTHOL 3 MG MT LOZG
1.0000 | LOZENGE | OROMUCOSAL | Status: DC | PRN
  Administered 2015-11-16 – 2015-11-18 (×9): 3 mg via ORAL
  Filled 2015-11-16 (×2): qty 9

## 2015-11-16 NOTE — Progress Notes (Addendum)
Patient A/O, no noted distress. Noted redness on back, under breast, and thighs. Patient notes allergy to tape. Crackles noted in lobes. Patient has a productive cough. Patient does have incont episodes. Staff will continue to monitor and meet staff. Patient spouse is being see in ED, as well. Patient slept well.

## 2015-11-16 NOTE — Progress Notes (Signed)
Pharmacy Note - Enoxaparin   Patient with orders for enoxaparin 45m SQ Q24H for VTE prophylaxis  Estimated Creatinine Clearance: 61.1 mL/min (by C-G formula based on Cr of 1.04). Body mass index is 41.32 kg/(m^2).  Will change to enoxaparin 40mg  SQ Q12H for BMI > 40 per anticoagulation policy.  Rexene Edison, PharmD Clinical Pharmacist  11/16/2015 9:11 AM

## 2015-11-16 NOTE — Progress Notes (Signed)
[facility] at [place] NAME: Sara Pierce    MR#:  [id number]  DATE OF BIRTH:  1941-01-25  SUBJECTIVE:  Came in with fever,sob and coughing up phlegm  REVIEW OF SYSTEMS:   Review of Systems  Constitutional: Negative for fever, chills and weight loss.  HENT: Negative for ear discharge, ear pain and nosebleeds.   Eyes: Negative for blurred vision, pain and discharge.  Respiratory: Positive for cough, sputum production and shortness of breath. Negative for wheezing and stridor.   Cardiovascular: Negative for chest pain, palpitations, orthopnea and PND.  Gastrointestinal: Negative for nausea, vomiting, abdominal pain and diarrhea.  Genitourinary: Negative for urgency and frequency.  Musculoskeletal: Negative for back pain and joint pain.  Neurological: Positive for weakness. Negative for sensory change, speech change and focal weakness.  Psychiatric/Behavioral: Negative for depression and hallucinations. The patient is not nervous/anxious.   All other systems reviewed and are negative.  Tolerating Diet:yes Tolerating PT: pending  DRUG ALLERGIES:   Allergies  Allergen Reactions  . Aspirin Swelling  . Codeine Itching  . Tape Hives    VITALS:  Blood pressure 102/52, pulse 70, temperature 98 F (36.7 C), temperature source Oral, resp. rate 20, height 5\' 5"  (1.651 m), weight 248 lb 4.8 oz (112.628 kg), SpO2 97 %.  PHYSICAL EXAMINATION:   Physical Exam  GENERAL:  74 y.o.-year-old patient lying in the bed with no acute distress.  EYES: Pupils equal, round, reactive to light and accommodation. No scleral icterus. Extraocular muscles intact.  HEENT: Head atraumatic, normocephalic. Oropharynx and nasopharynx clear.  NECK:  Supple, no jugular venous distention. No thyroid enlargement, no tenderness.  LUNGS:decrease breath sounds bilaterally, no wheezing, rales, rhonchi. No use of accessory muscles of respiration.  CARDIOVASCULAR: S1, S2  normal. No murmurs, rubs, or gallops.  ABDOMEN: Soft, nontender, nondistended. Bowel sounds present. No organomegaly or mass.  EXTREMITIES: No cyanosis, clubbing or edema b/l.    NEUROLOGIC: Cranial nerves II through XII are intact. No focal Motor or sensory deficits b/l.   PSYCHIATRIC: The patient is alert and oriented x 3.  SKIN: No obvious rash, lesion, or ulcer.    LABORATORY PANEL:   CBC  Recent Labs Lab 11/15/15 2245  WBC 21.4*  HGB 10.4*  HCT 31.5*  PLT 182    Chemistries   Recent Labs Lab 11/15/15 2046 11/15/15 2245  NA 133* 132*  K 4.4 4.2  CL 97* 100*  CO2 28 26  GLUCOSE 141* 151*  BUN 34* 31*  CREATININE 1.22* 1.04*  CALCIUM 9.2 8.4*  AST 15  --   ALT 14  --   ALKPHOS 93  --   BILITOT 0.5  --     Cardiac Enzymes No results for input(s): TROPONINI in the last 168 hours.  RADIOLOGY:  Dg Chest 2 View  11/15/2015  CLINICAL DATA:  Productive cough for the past 3-4 days. EXAM: CHEST  2 VIEW COMPARISON:  09/14/2015. FINDINGS: The cardiac silhouette remains mildly enlarged. Clear lungs with normal vascularity. The interstitial markings are mildly prominent. Interval small amount of airspace opacity in the posterior aspect of the left lower lobe, medially. 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 Diffuse osteopenia. IMPRESSION: 1. Left lower lobe atelectasis or pneumonia. 2. Mild chronic interstitial lung disease. Electronically Signed   By: Claudie Revering M.D.   On: 11/15/2015 21:46     ASSESSMENT AND PLAN:   74 year old  Caucasian female history of COPD oxygen requiring 3 L at nighttime presenting with cough and fever  1.Sepsis, meeting septic criteria by leukocytosis, respiratory rate present on arrival. Source community acquired pneumonia  -f/u Panculture. - Broad-spectrum antibiotics including ceftriaxone/azithromycin and taper antibiotics - Continue IV fluid hydration t -Add DuoNeb  treatments  -IV Solu-Medrol given COPD history -continue home medications for breathing including Spiriva  2. Hyperlipidemia unspecified: Statin therapy  3. Essential hypertension: Coreg, Aldactone  4. Venous thromboembolism prophylactic: Heparin subcutaneous   Case discussed with Care Management/Social Worker. Management plans discussed with the patient, family and they are in agreement.  CODE STATUS: full   DVT Prophylaxis: lovenox  TOTAL TIME TAKING CARE OF THIS PATIENT: 35 minutes.  >50% time spent on counselling and coordination of care  POSSIBLE D/C IN 1-2 DAYS, DEPENDING ON CLINICAL CONDITION.   Mattisyn Cardona M.D on 11/16/2015 at 3:53 PM  Between 7am to 6pm - Pager - [phone]  After 6pm go to [email or web address] - password [email or web address] [facility]  Office  [phone]  CC: Primary care physician; Talbert Cage, FNP

## 2015-11-17 LAB — BASIC METABOLIC PANEL
ANION GAP: 4 — AB (ref 5–15)
BUN: 26 mg/dL — AB (ref 6–20)
CALCIUM: 8.9 mg/dL (ref 8.9–10.3)
CO2: 27 mmol/L (ref 22–32)
Chloride: 105 mmol/L (ref 101–111)
Creatinine, Ser: 0.92 mg/dL (ref 0.44–1.00)
GFR calc Af Amer: >60 mL/min (ref >60)
GFR calc non Af Amer: >60 mL/min (ref >60)
GLUCOSE: 180 mg/dL — AB (ref 65–99)
Potassium: 4.8 mmol/L (ref 3.5–5.1)
Sodium: 136 mmol/L (ref 135–145)

## 2015-11-17 LAB — CBC
HEMATOCRIT: 33.3 % — AB (ref 35.0–47.0)
Hemoglobin: 10.8 g/dL — ABNORMAL LOW (ref 12.0–16.0)
MCH: 31 pg (ref 26.0–34.0)
MCHC: 32.5 g/dL (ref 32.0–36.0)
MCV: 95.7 fL (ref 80.0–100.0)
Platelets: 191 10*3/uL (ref 150–440)
RBC: 3.49 MIL/uL — ABNORMAL LOW (ref 3.80–5.20)
RDW: 14 % (ref 11.5–14.5)
WBC: 12.2 10*3/uL — AB (ref 3.6–11.0)

## 2015-11-17 MED ORDER — CLONAZEPAM 0.5 MG PO TABS
0.5000 mg | ORAL_TABLET | Freq: Three times a day (TID) | ORAL | Status: DC | PRN
  Administered 2015-11-17 – 2015-11-18 (×3): 0.5 mg via ORAL
  Filled 2015-11-17 (×3): qty 1

## 2015-11-17 MED ORDER — PREDNISONE 50 MG PO TABS
50.0000 mg | ORAL_TABLET | Freq: Every day | ORAL | Status: DC
  Administered 2015-11-17 – 2015-11-19 (×3): 50 mg via ORAL
  Filled 2015-11-17 (×3): qty 1

## 2015-11-17 MED ORDER — LEVOFLOXACIN 500 MG PO TABS
500.0000 mg | ORAL_TABLET | Freq: Every day | ORAL | Status: DC
  Administered 2015-11-17 – 2015-11-19 (×3): 500 mg via ORAL
  Filled 2015-11-17 (×3): qty 1

## 2015-11-17 NOTE — Progress Notes (Signed)
[place] at [place] NAME: Sara Pierce    MR#:  [id number]  DATE OF BIRTH:  August 20, 1941  SUBJECTIVE:  Feels better today, ambulated around nurses station once with some dyspnea, no drop in o2 sats. Complains of cough and anxiety  REVIEW OF SYSTEMS:   Review of Systems  Constitutional: Negative for fever, chills and weight loss.  HENT: Negative for ear discharge, ear pain and nosebleeds.   Eyes: Negative for blurred vision, pain and discharge.  Respiratory: Positive for cough, sputum production and shortness of breath. Negative for wheezing and stridor.   Cardiovascular: Negative for chest pain, palpitations, orthopnea and PND.  Gastrointestinal: Negative for nausea, vomiting, abdominal pain and diarrhea.  Genitourinary: Negative for urgency and frequency.  Musculoskeletal: Negative for back pain and joint pain.  Neurological: Positive for weakness. Negative for sensory change, speech change and focal weakness.  Psychiatric/Behavioral: Negative for depression and hallucinations. The patient is not nervous/anxious.   All other systems reviewed and are negative.  Tolerating Diet:yes Tolerating PT: pending  DRUG ALLERGIES:   Allergies  Allergen Reactions  . Aspirin Swelling  . Codeine Itching  . Tape Hives    VITALS:  Blood pressure 105/59, pulse 60, temperature 98.4 F (36.9 C), temperature source Oral, resp. rate 18, height 5\' 5"  (1.651 m), weight 112.628 kg (248 lb 4.8 oz), SpO2 94 %.  PHYSICAL EXAMINATION:   Physical Exam  GENERAL:  74 y.o.-year-old obese patient sitting in the bed with no acute distress.  EYES: Pupils equal, round, reactive to light and accommodation. No scleral icterus. Extraocular muscles intact.  HEENT: Head atraumatic, normocephalic. Oropharynx and nasopharynx clear.  NECK:  Supple, no jugular venous distention. No thyroid enlargement, no tenderness.  LUNGS:decrease breath sounds bilaterally,  occasional scattered wheeze, No rales, rhonchi. No use of accessory muscles of respiration.  CARDIOVASCULAR: S1, S2 normal. No murmurs, rubs, or gallops.  ABDOMEN: Soft, nontender, nondistended. Bowel sounds present. No organomegaly or mass.  EXTREMITIES: No cyanosis, clubbing or edema b/l.    NEUROLOGIC: Cranial nerves II through XII are intact. No focal Motor or sensory deficits b/l.   PSYCHIATRIC: The patient is alert and oriented x 3.  SKIN: No obvious rash, lesion, or ulcer.    LABORATORY PANEL:   CBC  Recent Labs Lab 11/15/15 2245  WBC 21.4*  HGB 10.4*  HCT 31.5*  PLT 182    Chemistries   Recent Labs Lab 11/15/15 2046 11/15/15 2245  NA 133* 132*  K 4.4 4.2  CL 97* 100*  CO2 28 26  GLUCOSE 141* 151*  BUN 34* 31*  CREATININE 1.22* 1.04*  CALCIUM 9.2 8.4*  AST 15  --   ALT 14  --   ALKPHOS 93  --   BILITOT 0.5  --     Cardiac Enzymes No results for input(s): TROPONINI in the last 168 hours.  RADIOLOGY:  Dg Chest 2 View  11/15/2015  CLINICAL DATA:  Productive cough for the past 3-4 days. EXAM: CHEST  2 VIEW COMPARISON:  09/14/2015. FINDINGS: The cardiac silhouette remains mildly enlarged. Clear lungs with normal vascularity. The interstitial markings are mildly prominent. Interval small amount of airspace opacity in the posterior aspect of the left lower lobe, medially. 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 Diffuse osteopenia. IMPRESSION: 1. Left lower lobe atelectasis or pneumonia. 2. Mild chronic interstitial lung disease. Electronically Signed   By: Claudie Revering  M.D.   On: 11/15/2015 21:46     ASSESSMENT AND PLAN:   74 year old Caucasian female history of COPD oxygen requiring 3 L at nighttime presenting with cough and fever  1.Sepsis, meeting septic criteria by leukocytosis, respiratory rate present on arrival. Source community acquired pneumonia  - also COPD exacerbation - Negative  blood cultures - on ceftriaxone/azithromycin - change to levaquin oral today -continue DuoNeb treatments  -change solumedrol to oral prednisone today -continue home inhalers - encourage ambulation, uses 2-3L home o2  2. Hyperlipidemia unspecified: Statin therapy  3. Essential hypertension: Coreg, Aldactone  4. Depression/anxiety- continue home medications   Case discussed with Care Management/Social Worker. Management plans discussed with the patient, family and they are in agreement.  CODE STATUS: full   DVT Prophylaxis: lovenox  TOTAL TIME TAKING CARE OF THIS PATIENT: 35 minutes.  >50% time spent on counselling and coordination of care  POSSIBLE D/C TOMORROW, DEPENDING ON CLINICAL CONDITION.   Gladstone Lighter M.D on 11/17/2015 at 8:44 AM  Between 7am to 6pm - Pager - [phone]  After 6pm go to [email or web address] - password [email or web address] [facility]  Office  [phone]  CC: Primary care physician; Talbert Cage, FNP

## 2015-11-17 NOTE — Care Management Important Message (Signed)
Important Message  Patient Details  Name: Sara Pierce MRN: [id number] Date of Birth: Jan 20, 1941   Medicare Important Message Given:  Yes    [name] 11/17/2015, 10:07 AM

## 2015-11-17 NOTE — Care Management (Addendum)
[facility] is active in [facility], Kim Murray from [facility] aware of patient inpatient status. 

## 2015-11-18 ENCOUNTER — Inpatient Hospital Stay: Payer: Medicare (Managed Care)

## 2015-11-18 LAB — URINE CULTURE

## 2015-11-18 MED ORDER — SPIRONOLACTONE 25 MG PO TABS
25.0000 mg | ORAL_TABLET | Freq: Every day | ORAL | Status: DC
  Administered 2015-11-18 – 2015-11-19 (×2): 25 mg via ORAL
  Filled 2015-11-18 (×2): qty 1

## 2015-11-18 MED ORDER — CYCLOBENZAPRINE HCL 10 MG PO TABS
10.0000 mg | ORAL_TABLET | Freq: Once | ORAL | Status: AC
Start: 2015-11-18 — End: 2015-11-18
  Administered 2015-11-18: 10 mg via ORAL
  Filled 2015-11-18: qty 1

## 2015-11-18 MED ORDER — IPRATROPIUM-ALBUTEROL 0.5-2.5 (3) MG/3ML IN SOLN
3.0000 mL | Freq: Four times a day (QID) | RESPIRATORY_TRACT | Status: DC
  Administered 2015-11-18 – 2015-11-19 (×3): 3 mL via RESPIRATORY_TRACT
  Filled 2015-11-18 (×4): qty 3

## 2015-11-18 MED ORDER — METHYLPREDNISOLONE SODIUM SUCC 125 MG IJ SOLR
60.0000 mg | Freq: Once | INTRAMUSCULAR | Status: AC
  Administered 2015-11-18: 60 mg via INTRAVENOUS
  Filled 2015-11-18: qty 2

## 2015-11-18 MED ORDER — BENZONATATE 100 MG PO CAPS
200.0000 mg | ORAL_CAPSULE | Freq: Three times a day (TID) | ORAL | Status: DC
  Administered 2015-11-18 – 2015-11-19 (×3): 200 mg via ORAL
  Filled 2015-11-18 (×3): qty 2

## 2015-11-18 MED ORDER — IPRATROPIUM-ALBUTEROL 0.5-2.5 (3) MG/3ML IN SOLN
3.0000 mL | RESPIRATORY_TRACT | Status: AC
  Administered 2015-11-18: 3 mL via RESPIRATORY_TRACT
  Filled 2015-11-18: qty 3

## 2015-11-18 NOTE — Clinical Documentation Improvement (Signed)
Internal Medicine  Can the diagnosis of CKD be further specified in progress notes and discharge summary?   Acute renal failure  Acute on chronic renal failure  CKD Stage I - GFR greater than or equal to 90  CKD Stage II - GFR 60-89  CKD Stage III - GFR 30-59  CKD Stage IV - GFR 15-29  CKD Stage V - GFR < 15  ESRD (End Stage Renal Disease)  Other condition  Unable to clinically determine   Supporting Information:    History of CKD Component     Latest Ref Rng 11/15/2015 11/15/2015 11/17/2015         8:46 PM 10:45 PM   BUN     6 - 20 mg/dL 34 (H) 31 (H) 26 (H)  Creatinine     0.44 - 1.00 mg/dL 1.22 (H) 1.04 (H) 0.92                                                EGFR (Non-African Amer.)     >60 mL/min 43 (L) 52 (L) >60   Treatment Monitoring I&Os and BMPs  Please exercise your independent, professional judgment when responding. A specific answer is not anticipated or expected.   Thank You, [place] [phone]

## 2015-11-18 NOTE — Progress Notes (Signed)
[facility] at [place] NAME: Sara Pierce    MR#:  [id number]  DATE OF BIRTH:  12/15/1940  SUBJECTIVE:     REVIEW OF SYSTEMS:   Review of Systems  Constitutional: Negative for fever, chills and weight loss.  HENT: Negative for ear discharge, ear pain and nosebleeds.   Eyes: Negative for blurred vision, pain and discharge.  Respiratory: Positive for cough, sputum production and shortness of breath. Negative for wheezing and stridor.   Cardiovascular: Negative for chest pain, palpitations, orthopnea and PND.  Gastrointestinal: Negative for nausea, vomiting, abdominal pain and diarrhea.  Genitourinary: Negative for urgency and frequency.  Musculoskeletal: Negative for back pain and joint pain.  Neurological: Positive for weakness. Negative for sensory change, speech change and focal weakness.  Psychiatric/Behavioral: Negative for depression and hallucinations. The patient is not nervous/anxious.   All other systems reviewed and are negative.  Tolerating Diet:yes  DRUG ALLERGIES:   Allergies  Allergen Reactions  . Aspirin Swelling  . Codeine Itching  . Tape Hives    VITALS:  Blood pressure 115/59, pulse 65, temperature 98.5 F (36.9 C), temperature source Oral, resp. rate 20, height 5\' 5"  (1.651 m), weight 112.628 kg (248 lb 4.8 oz), SpO2 98 %.  PHYSICAL EXAMINATION:   Physical Exam  GENERAL:  74 y.o.-year-old obese patient sitting in the bed with no acute distress.  EYES: Pupils equal, round, reactive to light and accommodation. No scleral icterus. Extraocular muscles intact.  HEENT: Head atraumatic, normocephalic. Oropharynx and nasopharynx clear.  NECK:  Supple, no jugular venous distention. No thyroid enlargement, no tenderness.  LUNGS:decrease breath sounds bilaterally, scattered wheeze, No rales, rhonchi. No use of accessory muscles of respiration.  CARDIOVASCULAR: S1, S2 normal. No murmurs, rubs, or gallops.  ABDOMEN:  Soft, nontender, nondistended. Bowel sounds present. No organomegaly or mass.  EXTREMITIES: No cyanosis, clubbing or edema b/l.    NEUROLOGIC: Cranial nerves II through XII are intact. No focal Motor or sensory deficits b/l.   PSYCHIATRIC: The patient is alert and oriented x 3.  SKIN: No obvious rash, lesion, or ulcer.    LABORATORY PANEL:   CBC  Recent Labs Lab 11/17/15 0940  WBC 12.2*  HGB 10.8*  HCT 33.3*  PLT 191    Chemistries   Recent Labs Lab 11/15/15 2046  11/17/15 0940  NA 133*  < > 136  K 4.4  < > 4.8  CL 97*  < > 105  CO2 28  < > 27  GLUCOSE 141*  < > 180*  BUN 34*  < > 26*  CREATININE 1.22*  < > 0.92  CALCIUM 9.2  < > 8.9  AST 15  --   --   ALT 14  --   --   ALKPHOS 93  --   --   BILITOT 0.5  --   --   < > = values in this interval not displayed.  Cardiac Enzymes No results for input(s): TROPONINI in the last 168 hours.  RADIOLOGY:  No results found.   ASSESSMENT AND PLAN:   74 year old Caucasian female history of COPD oxygen requiring 3 L at nighttime presenting with cough and fever  1.Sepsis-  community acquired pneumonia  - also COPD exacerbation - Negative blood cultures - on oral levaquin -continue DuoNeb treatments  -on prednisone orally, but due to an episode of wheezing- will give 1 dose of IV solumedrol and an extra neb trt -continue home inhalers - encourage ambulation, uses  2-3L home o2 - Adjusting cough medicines - repeat CXR today  2. Hyperlipidemia unspecified: Statin therapy  3. Essential hypertension: Coreg, Aldactone  4. Depression/anxiety- continue home medications   Case discussed with Care Management/Social Worker. Management plans discussed with the patient, family and they are in agreement.  CODE STATUS: full   DVT Prophylaxis: lovenox  TOTAL TIME TAKING CARE OF THIS PATIENT: 36 minutes.   POSSIBLE D/C TOMORROW, DEPENDING ON CLINICAL CONDITION.   [name] M.D on 11/18/2015 at 3:00 PM  Between  7am to 6pm - Pager - [phone]  After 6pm go to [email or web address] - password [email or web address] [name]  Office  [phone]  CC: Primary care physician; Talbert Cage, FNP

## 2015-11-19 LAB — BASIC METABOLIC PANEL
ANION GAP: 5 (ref 5–15)
BUN: 27 mg/dL — ABNORMAL HIGH (ref 6–20)
CALCIUM: 9.1 mg/dL (ref 8.9–10.3)
CO2: 30 mmol/L (ref 22–32)
CREATININE: 0.89 mg/dL (ref 0.44–1.00)
Chloride: 101 mmol/L (ref 101–111)
GFR calc Af Amer: >60 mL/min (ref >60)
GFR calc non Af Amer: >60 mL/min (ref >60)
GLUCOSE: 116 mg/dL — AB (ref 65–99)
Potassium: 4.8 mmol/L (ref 3.5–5.1)
Sodium: 136 mmol/L (ref 135–145)

## 2015-11-19 MED ORDER — SPIRONOLACTONE 25 MG PO TABS: 25.0000 mg | ORAL_TABLET | Freq: Every day | ORAL | Status: DC

## 2015-11-19 MED ORDER — PREDNISONE 10 MG (21) PO TBPK: 10.0000 mg | ORAL_TABLET | Freq: Every day | ORAL | Status: DC

## 2015-11-19 MED ORDER — FUROSEMIDE 20 MG PO TABS: 20.0000 mg | ORAL_TABLET | Freq: Every day | ORAL | Status: DC

## 2015-11-19 MED ORDER — CLONAZEPAM 0.5 MG PO TABS: 0.5000 mg | ORAL_TABLET | Freq: Three times a day (TID) | ORAL | Status: DC | PRN

## 2015-11-19 MED ORDER — LEVOFLOXACIN 500 MG PO TABS: 500.0000 mg | ORAL_TABLET | Freq: Every day | ORAL | Status: AC

## 2015-11-19 MED ORDER — GUAIFENESIN ER 600 MG PO TB12: 600.0000 mg | ORAL_TABLET | Freq: Two times a day (BID) | ORAL | Status: DC

## 2015-11-19 NOTE — Care Management Important Message (Signed)
Important Message  Patient Details  Name: Sara Pierce MRN: [id number] Date of Birth: 09-27-1941   Medicare Important Message Given:  Yes    Juliann Pulse A Tamatha Gadbois 11/19/2015, 11:22 AM

## 2015-11-19 NOTE — Progress Notes (Signed)
Alert and oriented. VS stable. No signs of acute distress. Discharge instructions given. Patient verbalized understanding. [place] home with husband .

## 2015-11-19 NOTE — Care Management (Signed)
Contacted Thresa Ross at [facility] to inform of patient discharge. Will fax discharge sumary and medication list to [phone].  [facility] Lucianne Lei can pick up patient if before 1300 discharge. Karyl Kinnier information to patient nurse [place] who will call when patient ready.

## 2015-11-19 NOTE — Discharge Summary (Addendum)
[facility] NAME: Sara Pierce    MR#:  [id number]  DATE OF BIRTH:  [birth year]  DATE OF ADMISSION:  11/15/2015 ADMITTING PHYSICIAN: Lytle Butte, MD  DATE OF DISCHARGE: 11/19/2015 12:12 PM  PRIMARY CARE PHYSICIAN: Talbert Cage, FNP    ADMISSION DIAGNOSIS:  Community acquired pneumonia [J18.9]  DISCHARGE DIAGNOSIS:  Principal Problem:   Sepsis ([place]) Active Problems:   Community acquired pneumonia   SECONDARY DIAGNOSIS:   Past Medical History  Diagnosis Date  . COPD (chronic obstructive pulmonary disease) ([place])   . Clotting disorder ([place])   . Chronic kidney disease   . Depression   . Cardiomyopathy ([place])   . Arthritis   . Cataract   . Cerebral hemorrhage ([place])   . Diabetes mellitus without complication ([place])   . Hypertension   . Obesity   . Sleep apnea   . Urinary frequency   . Vaginal atrophy   . Varicose veins   . Mixed incontinence   . Thyroid disease     HOSPITAL COURSE:   74 year old Caucasian female history of COPD oxygen requiring 3 L at nighttime presenting with cough and fever  1.Sepsis- community acquired pneumonia  - also COPD exacerbation - Negative blood cultures - on oral levaquin for 2 more days -continue DuoNeb treatments  -on prednisone orally, taper as outpatient -continue home inhalers - uses 2-3L home o2 -cough medicines - repeat CXR last evening with LLL atelectasis vs infiltrate- on levaquin  2. Proteus UTI- urine cultures growing proteus - on levaquin  3. Hyperlipidemia unspecified: Statin therapy  4. Essential hypertension: Coreg, Aldactone  5. Depression/anxiety- continue home medications  Discharge today. At baseline  DISCHARGE CONDITIONS:   Stable  CONSULTS OBTAINED:  Treatment Team:  Lytle Butte, MD  DRUG ALLERGIES:   Allergies  Allergen Reactions  . Aspirin Swelling  . Codeine Itching  . Tape Hives    DISCHARGE MEDICATIONS:    Discharge Medication List as of 11/19/2015 11:42 AM    START taking these medications   Details  guaiFENesin (MUCINEX) 600 MG 12 hr tablet Take 1 tablet (600 mg total) by mouth 2 (two) times daily., Starting 11/19/2015, Until Discontinued, Print    levofloxacin (LEVAQUIN) 500 MG tablet Take 1 tablet (500 mg total) by mouth daily., Starting 11/19/2015, Until Fri 11/21/15, Print    predniSONE (STERAPRED UNI-PAK 21 TAB) 10 MG (21) TBPK tablet Take 1 tablet (10 mg total) by mouth daily. 5 tabs PO x 1 day 4 tabs PO x 1 day 3 tabs PO x 1 day 2 tabs PO x 1 day 1 tab PO x 1 day and stop, Starting 11/19/2015, Until Discontinued, Print      CONTINUE these medications which have CHANGED   Details  clonazePAM (KLONOPIN) 0.5 MG tablet Take 1 tablet (0.5 mg total) by mouth 3 (three) times daily as needed (Anxiety)., Starting 11/19/2015, Until Discontinued, Print    furosemide (LASIX) 20 MG tablet Take 1 tablet (20 mg total) by mouth daily., Starting 11/19/2015, Until Discontinued, Print    spironolactone (ALDACTONE) 25 MG tablet Take 1 tablet (25 mg total) by mouth daily., Starting 11/19/2015, Until Discontinued, Print      CONTINUE these medications which have NOT CHANGED   Details  albuterol (PROVENTIL HFA;VENTOLIN HFA) 108 (90 BASE) MCG/ACT inhaler Inhale 2 puffs into the lungs every 6 (six) hours as needed for wheezing. , Until Discontinued, Historical Med    benzonatate (TESSALON) 200  MG capsule Take 200 mg by mouth 3 (three) times daily as needed for cough., Until Discontinued, Historical Med    buPROPion (WELLBUTRIN XL) 300 MG 24 hr tablet Take 300 mg by mouth daily. , Until Discontinued, Historical Med    carvedilol (COREG) 6.25 MG tablet Take 6.25 mg by mouth 2 (two) times daily. , Until Discontinued, Historical Med    clotrimazole (LOTRIMIN) 1 % cream Apply 1 application topically 2 (two) times daily., Until Discontinued, Historical Med    clotrimazole-betamethasone (LOTRISONE) cream  Apply 1 application topically 2 (two) times daily., Until Discontinued, Historical Med    estradiol (ESTRACE) 0.1 MG/GM vaginal cream Place 1 Applicatorful vaginally 3 (three) times a week. Apply a pea-sized amount with tip of finger on Monday, Wednesday, and Friday night, Until Discontinued, Historical Med    FLUoxetine (PROZAC) 40 MG capsule Take 40 mg by mouth daily., Until Discontinued, Historical Med    Fluticasone-Salmeterol (ADVAIR) 500-50 MCG/DOSE AEPB Inhale 1 puff into the lungs 2 (two) times daily., Until Discontinued, Historical Med    hydrocortisone cream 1 % Apply 1 application topically 2 (two) times daily as needed for itching., Until Discontinued, Historical Med    ipratropium-albuterol (DUONEB) 0.5-2.5 (3) MG/3ML SOLN Inhale 3 mLs into the lungs 4 (four) times daily as needed (wheezing). , Until Discontinued, Historical Med    liothyronine (CYTOMEL) 25 MCG tablet Take 25 mcg by mouth daily. , Until Discontinued, Historical Med    magnesium oxide (MAG-OX) 400 MG tablet Take 400 mg by mouth daily. , Until Discontinued, Historical Med    montelukast (SINGULAIR) 10 MG tablet Take 10 mg by mouth at bedtime. , Until Discontinued, Historical Med    nystatin (MYCOSTATIN/NYSTOP) 100000 UNIT/GM POWD Apply topically 2 (two) times daily as needed (rash). 1 application, Until Discontinued, Historical Med    potassium chloride (K-DUR) 10 MEQ tablet Take 10 mEq by mouth daily. , Until Discontinued, Historical Med    primidone (MYSOLINE) 50 MG tablet Take 50 mg by mouth 2 (two) times daily. , Until Discontinued, Historical Med    simvastatin (ZOCOR) 40 MG tablet Take 40 mg by mouth at bedtime. , Until Discontinued, Historical Med    solifenacin (VESICARE) 10 MG tablet Take 10 mg by mouth daily., Until Discontinued, Historical Med    tiotropium (SPIRIVA) 18 MCG inhalation capsule Place 18 mcg into inhaler and inhale daily. , Until Discontinued, Historical Med    traZODone (DESYREL) 50  MG tablet Take 50 mg by mouth at bedtime as needed for sleep., Until Discontinued, Historical Med    triamcinolone (KENALOG) 0.025 % cream Apply 1 application topically as needed (itchiness)., Until Discontinued, Historical Med    Vitamin D, Ergocalciferol, (DRISDOL) 50000 UNITS CAPS capsule Take 50,000 Units by mouth every 30 (thirty) days. On the first of each month, Until Discontinued, Historical Med    oxyCODONE-acetaminophen (ROXICET) 5-325 MG per tablet Take 1 tablet by mouth every 6 (six) hours as needed., Starting 06/05/2015, Until Discontinued, Print         DISCHARGE INSTRUCTIONS:   1. PCP f/u in 1-2 weeks  If you experience worsening of your admission symptoms, develop shortness of breath, life threatening emergency, suicidal or homicidal thoughts you must seek medical attention immediately by calling 911 or calling your MD immediately  if symptoms less severe.  You Must read complete instructions/literature along with all the possible adverse reactions/side effects for all the Medicines you take and that have been prescribed to you. Take any new Medicines after you  have completely understood and accept all the possible adverse reactions/side effects.   Please note  You were cared for by a hospitalist during your hospital stay. If you have any questions about your discharge medications or the care you received while you were in the hospital after you are discharged, you can call the unit and asked to speak with the hospitalist on call if the hospitalist that took care of you is not available. Once you are discharged, your primary care physician will handle any further medical issues. Please note that NO REFILLS for any discharge medications will be authorized once you are discharged, as it is imperative that you return to your primary care physician (or establish a relationship with a primary care physician if you do not have one) for your aftercare needs so that they can reassess  your need for medications and monitor your lab values.    Today   CHIEF COMPLAINT:   Chief Complaint  Patient presents with  . Weakness    VITAL SIGNS:  Blood pressure 118/56, pulse 71, temperature 98.2 F (36.8 C), temperature source Oral, resp. rate 19, height 5\' 5"  (1.651 m), weight 112.628 kg (248 lb 4.8 oz), SpO2 97 %.  I/O:   Intake/Output Summary (Last 24 hours) at 11/19/15 1246 Last data filed at 11/19/15 0900  Gross per 24 hour  Intake    720 ml  Output   1850 ml  Net  -1130 ml    PHYSICAL EXAMINATION:   Physical Exam  GENERAL: 74 y.o.-year-old obese patient sitting in the bed with no acute distress.  EYES: Pupils equal, round, reactive to light and accommodation. No scleral icterus. Extraocular muscles intact.  HEENT: Head atraumatic, normocephalic. Oropharynx and nasopharynx clear.  NECK: Supple, no jugular venous distention. No thyroid enlargement, no tenderness.  LUNGS: decrease breath sounds bilaterally, scattered wheeze, No rales, rhonchi. No use of accessory muscles of respiration.  CARDIOVASCULAR: S1, S2 normal. No murmurs, rubs, or gallops.  ABDOMEN: Soft, nontender, nondistended. Bowel sounds present. No organomegaly or mass.  EXTREMITIES: No cyanosis, clubbing or edema b/l.  NEUROLOGIC: Cranial nerves II through XII are intact. No focal Motor or sensory deficits b/l.  PSYCHIATRIC: The patient is alert and oriented x 3.  SKIN: No obvious rash, lesion, or ulcer.   DATA REVIEW:   CBC  Recent Labs Lab 11/17/15 0940  WBC 12.2*  HGB 10.8*  HCT 33.3*  PLT 191    Chemistries   Recent Labs Lab 11/15/15 2046  11/19/15 0500  NA 133*  < > 136  K 4.4  < > 4.8  CL 97*  < > 101  CO2 28  < > 30  GLUCOSE 141*  < > 116*  BUN 34*  < > 27*  CREATININE 1.22*  < > 0.89  CALCIUM 9.2  < > 9.1  AST 15  --   --   ALT 14  --   --   ALKPHOS 93  --   --   BILITOT 0.5  --   --   < > = values in this interval not displayed.  Cardiac  Enzymes No results for input(s): TROPONINI in the last 168 hours.  Microbiology Results  Results for orders placed or performed during the hospital encounter of 11/15/15  Blood Culture (routine x 2)     Status: None (Preliminary result)   Collection Time: 11/15/15  8:46 PM  Result Value Ref Range Status   Specimen Description BLOOD LEFT FACE  Final  Special Requests BOTTLES DRAWN AEROBIC AND ANAEROBIC 4CC  Final   Culture NO GROWTH 4 DAYS  Final   Report Status PENDING  Incomplete  Blood Culture (routine x 2)     Status: None (Preliminary result)   Collection Time: 11/15/15  8:47 PM  Result Value Ref Range Status   Specimen Description BLOOD RIGHT WRIST  Final   Special Requests BOTTLES DRAWN AEROBIC AND ANAEROBIC 4CC  Final   Culture NO GROWTH 4 DAYS  Final   Report Status PENDING  Incomplete  Urine culture     Status: None   Collection Time: 11/15/15  9:08 PM  Result Value Ref Range Status   Specimen Description URINE, RANDOM  Final   Special Requests NONE  Final   Culture >=100,000 COLONIES/mL PROTEUS MIRABILIS  Final   Report Status 11/18/2015 FINAL  Final   Organism ID, Bacteria PROTEUS MIRABILIS  Final      Susceptibility   Proteus mirabilis - MIC*    AMPICILLIN <=2 SENSITIVE Sensitive     CEFTAZIDIME <=1 SENSITIVE Sensitive     CEFAZOLIN <=4 SENSITIVE Sensitive     CEFTRIAXONE <=1 SENSITIVE Sensitive     CIPROFLOXACIN <=0.25 SENSITIVE Sensitive     GENTAMICIN <=1 SENSITIVE Sensitive     IMIPENEM 2 SENSITIVE Sensitive     TRIMETH/SULFA <=20 SENSITIVE Sensitive     PIP/TAZO Value in next row Sensitive      SENSITIVE<=4    NITROFURANTOIN Value in next row Resistant      RESISTANT128    * >=100,000 COLONIES/mL PROTEUS MIRABILIS    RADIOLOGY:  Dg Chest 2 View  11/18/2015  CLINICAL DATA:  Pneumonia. EXAM: CHEST  2 VIEW COMPARISON:  November 15, 2015. FINDINGS: Stable cardiomegaly. No pneumothorax is noted. Right lung is clear. Mild left basilar opacity is noted  concerning for pneumonia or atelectasis with possible small associated pleural effusion. Old proximal left humeral fracture is noted. Status post right shoulder arthroplasty. IMPRESSION: Mild left basilar opacity is noted concerning for pneumonia or atelectasis with possible small associated pleural effusion. Electronically Signed   By: Marijo Conception, M.D.   On: 11/18/2015 16:33    EKG:   Orders placed or performed during the hospital encounter of 09/14/15  . ED EKG within 10 minutes  . ED EKG within 10 minutes  . EKG 12-Lead  . EKG 12-Lead  . EKG      Management plans discussed with the patient, family and they are in agreement.  CODE STATUS:     Code Status Orders        Start     Ordered   11/15/15 2224  Full code   Continuous     11/15/15 2223      TOTAL TIME TAKING CARE OF THIS PATIENT: 37 minutes.    Gladstone Lighter M.D on 11/19/2015 at 12:46 PM  Between 7am to 6pm - Pager - [phone]  After 6pm go to [email or web address] - password [email or web address] [facility]  Office  [phone]  CC: Primary care physician; Talbert Cage, FNP

## 2015-11-20 LAB — CULTURE, BLOOD (ROUTINE X 2)
CULTURE: NO GROWTH
Culture: NO GROWTH

## 2015-11-20 IMAGING — CR DG CHEST 2V
1 series · 4 of 4 positions shown · non-contrast
Comparison: DG CHEST 2V dated 02/08/2014;

CLINICAL DATA: Cough, congestion

EXAM:
CHEST  2 VIEW

[Series 1: x chest ap · 0.14mm/px · 4 of 4 slices shown]
[im 1/4]
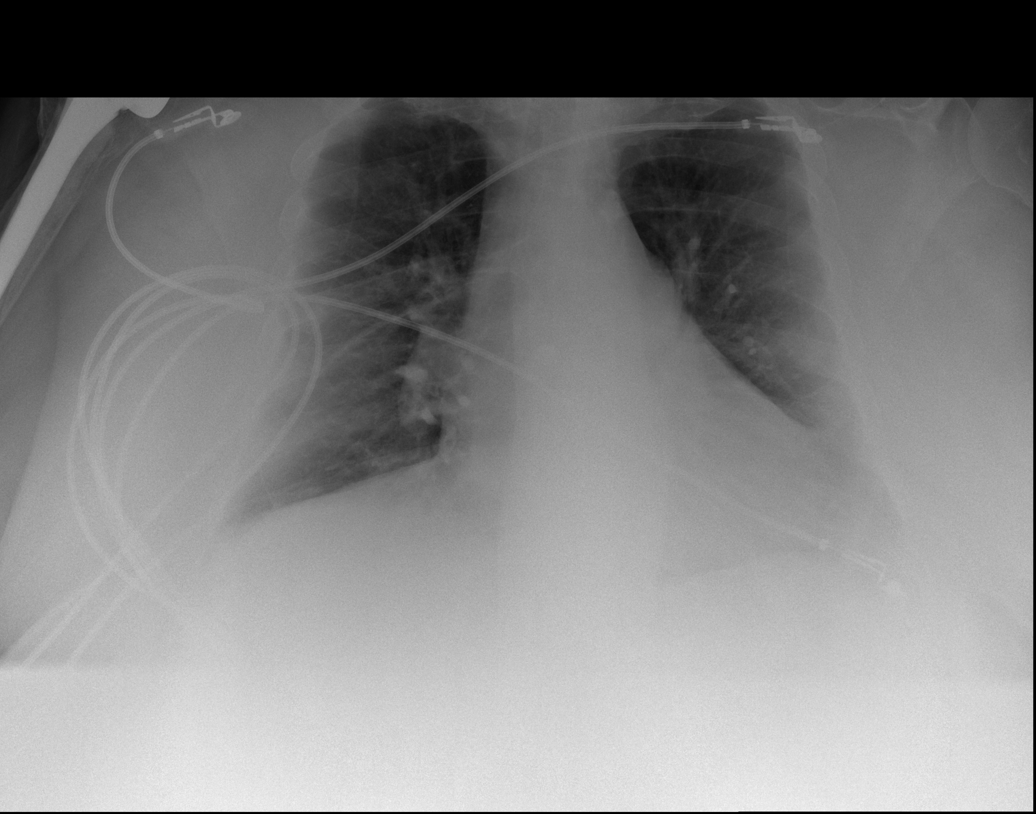
[im 2/4]
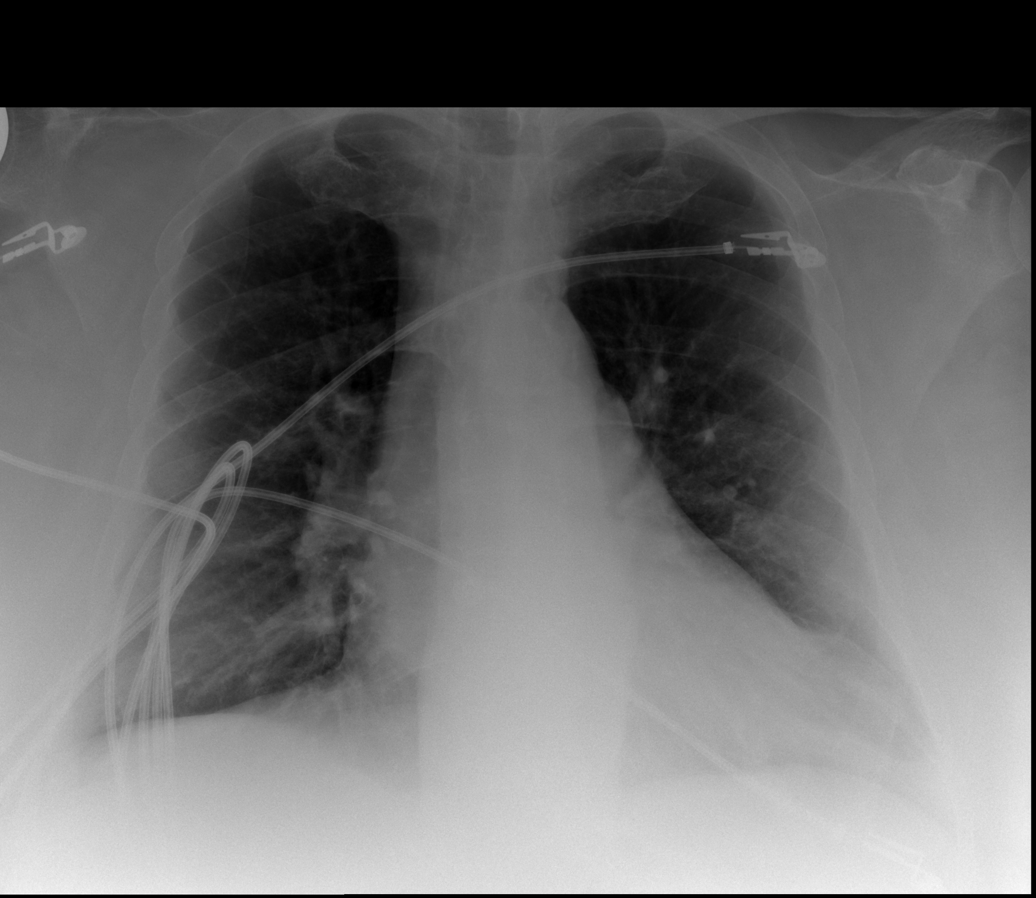
[im 3/4]
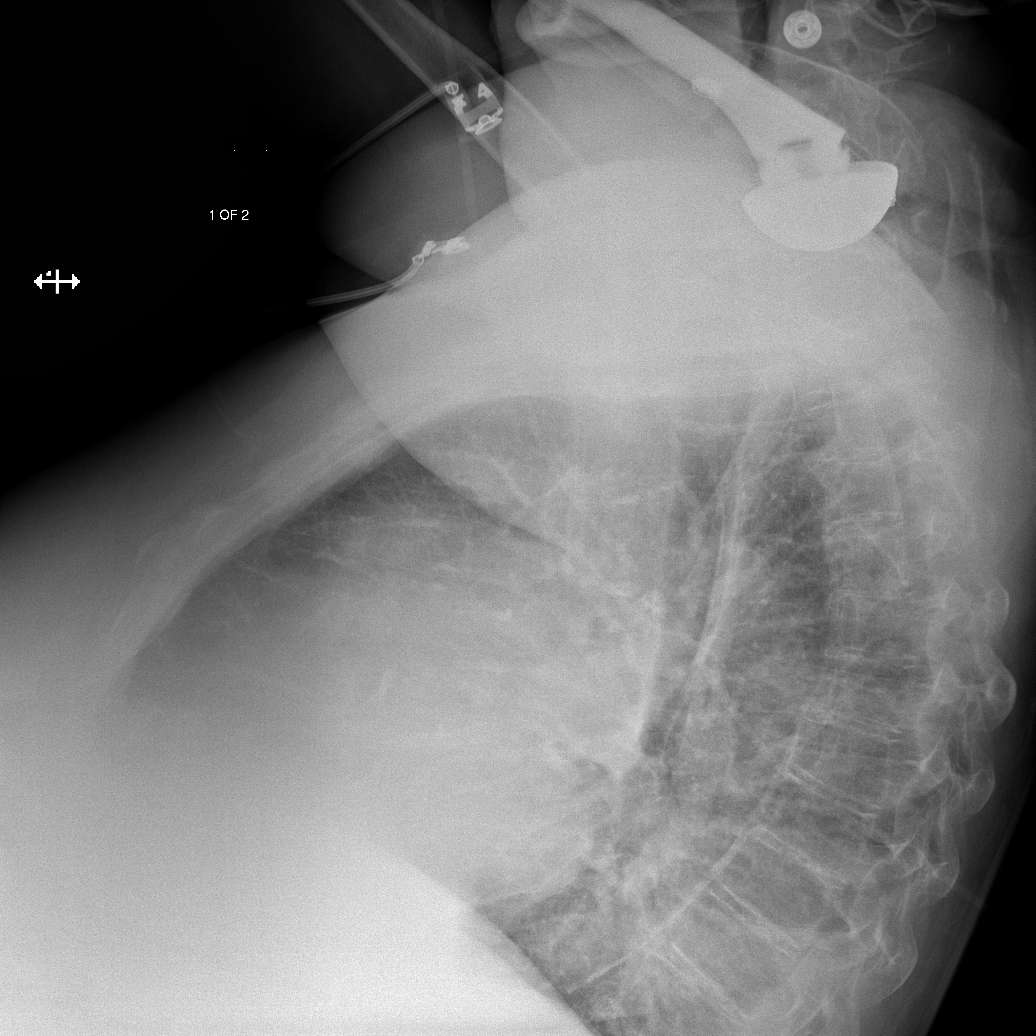
[im 4/4]
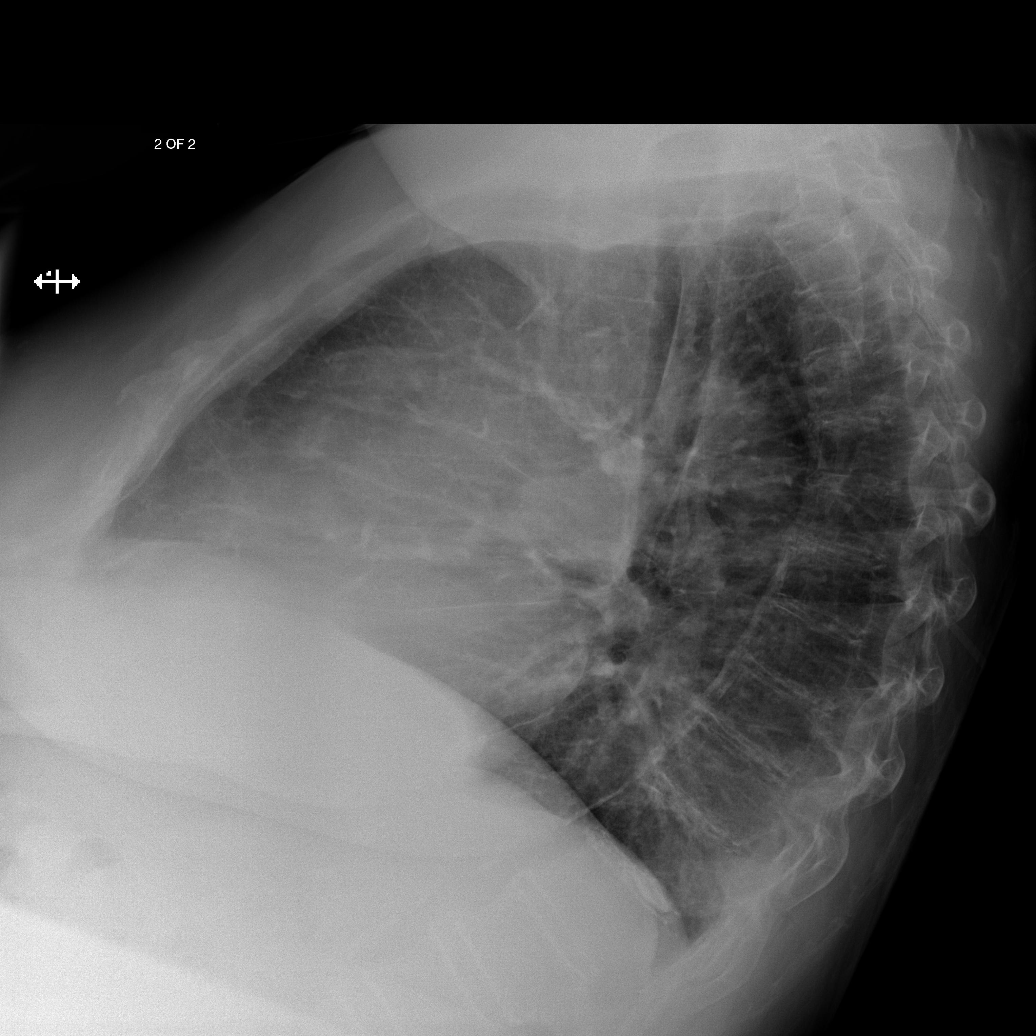

[4 of 4 positions shown; findings below may reference images not displayed]

DG RIBS 2V*R* dated
02/02/2014; DG CHEST 1V PORT dated 01/10/2014; DG CHEST 2V dated
10/11/2013
FINDINGS: The examination is degraded due to patient body habitus.

Grossly unchanged enlarged cardiac silhouette and mediastinal
contours. Grossly unchanged bibasilar heterogeneous opacities, left
greater than right, likely atelectasis. No new discrete focal
airspace opacities. No pleural effusion or pneumothorax. No definite
evidence of edema. No acute osseus abnormalities. Post right-sided
hemi humeral arthroplasty, incompletely evaluated.
IMPRESSION: Cardiomegaly and bibasilar atelectasis without definite acute
cardiopulmonary disease.

## 2015-12-13 IMAGING — CR DG CHEST 2V
1 series · 2 of 2 positions shown · non-contrast
Comparison: Chest radiograph 04/12/2014

CLINICAL DATA: Shortness of breath.  Right rib pain after fall.

EXAM:
CHEST  2 VIEW

[Series 1: x chest ap · 0.14mm/px · 2 of 2 slices shown]
[im 1/2]
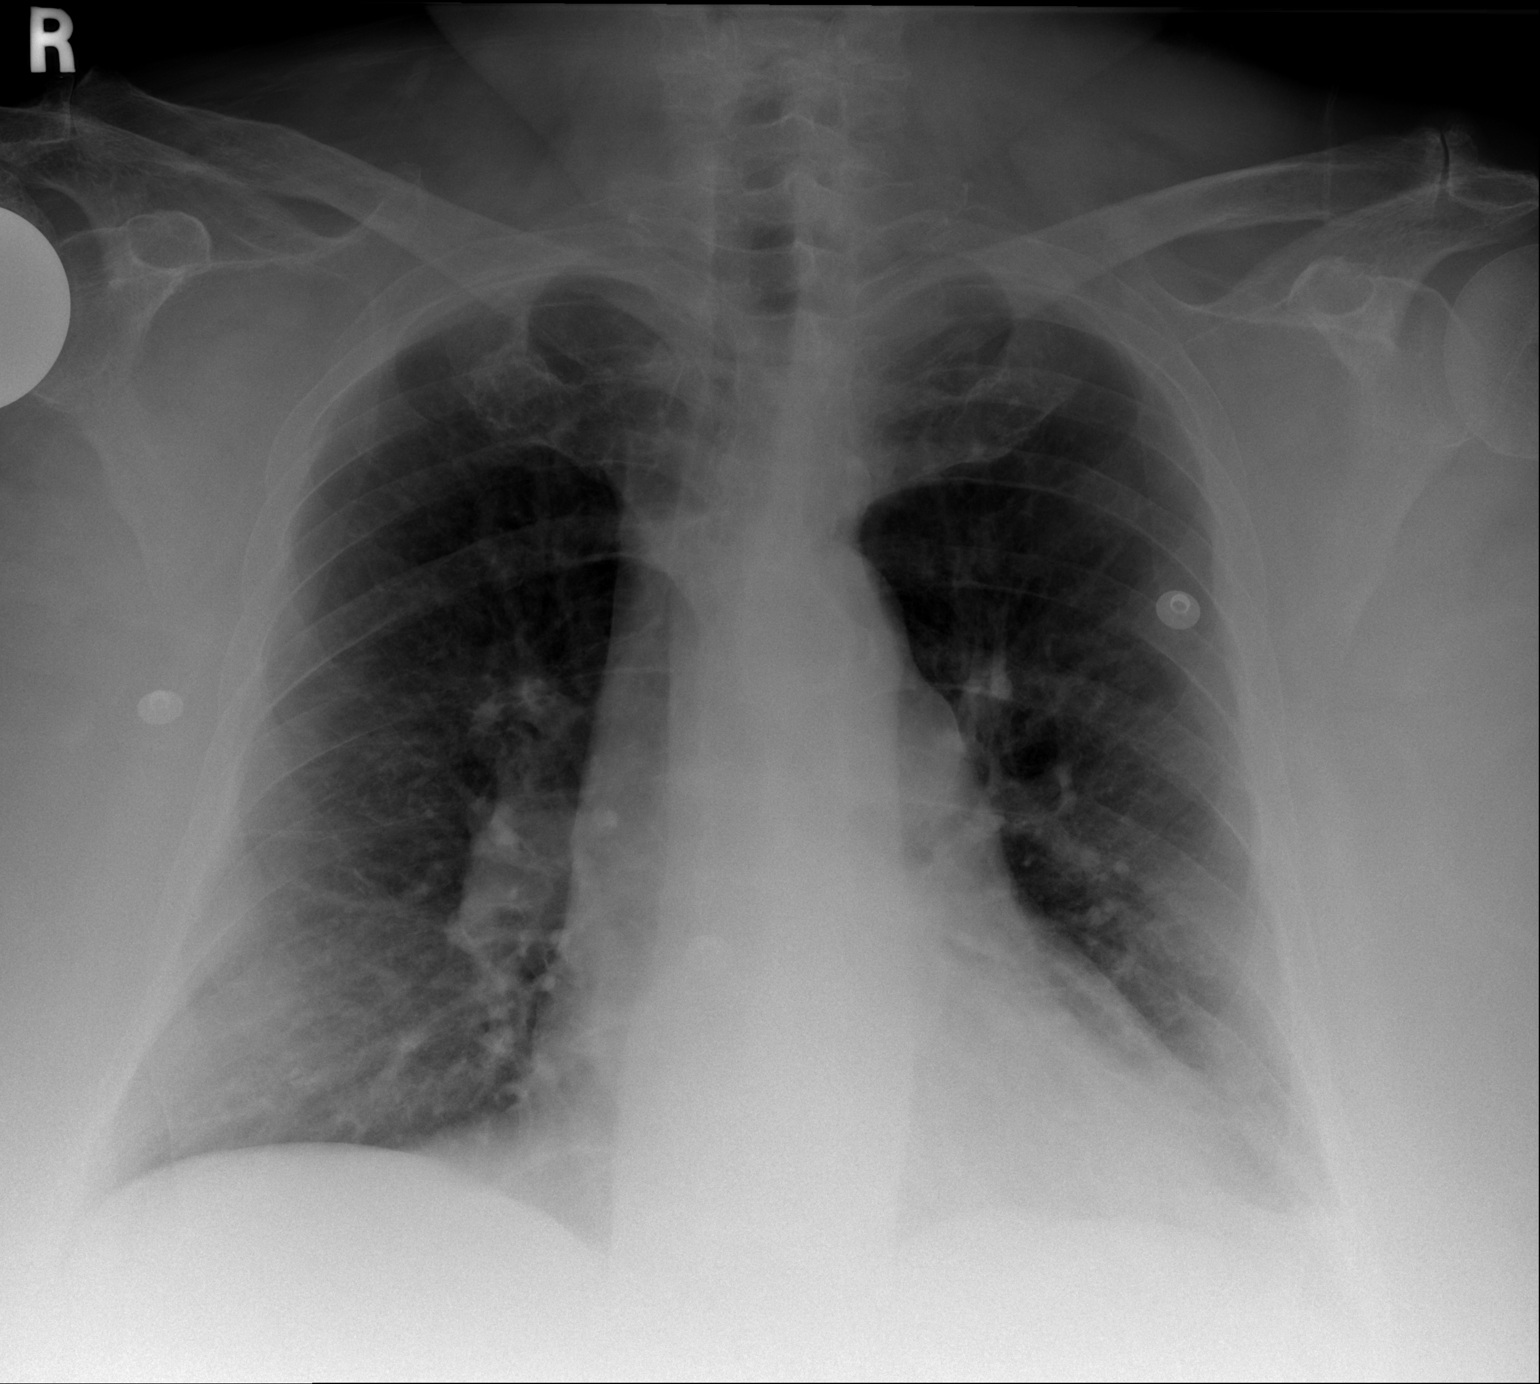
[im 2/2]
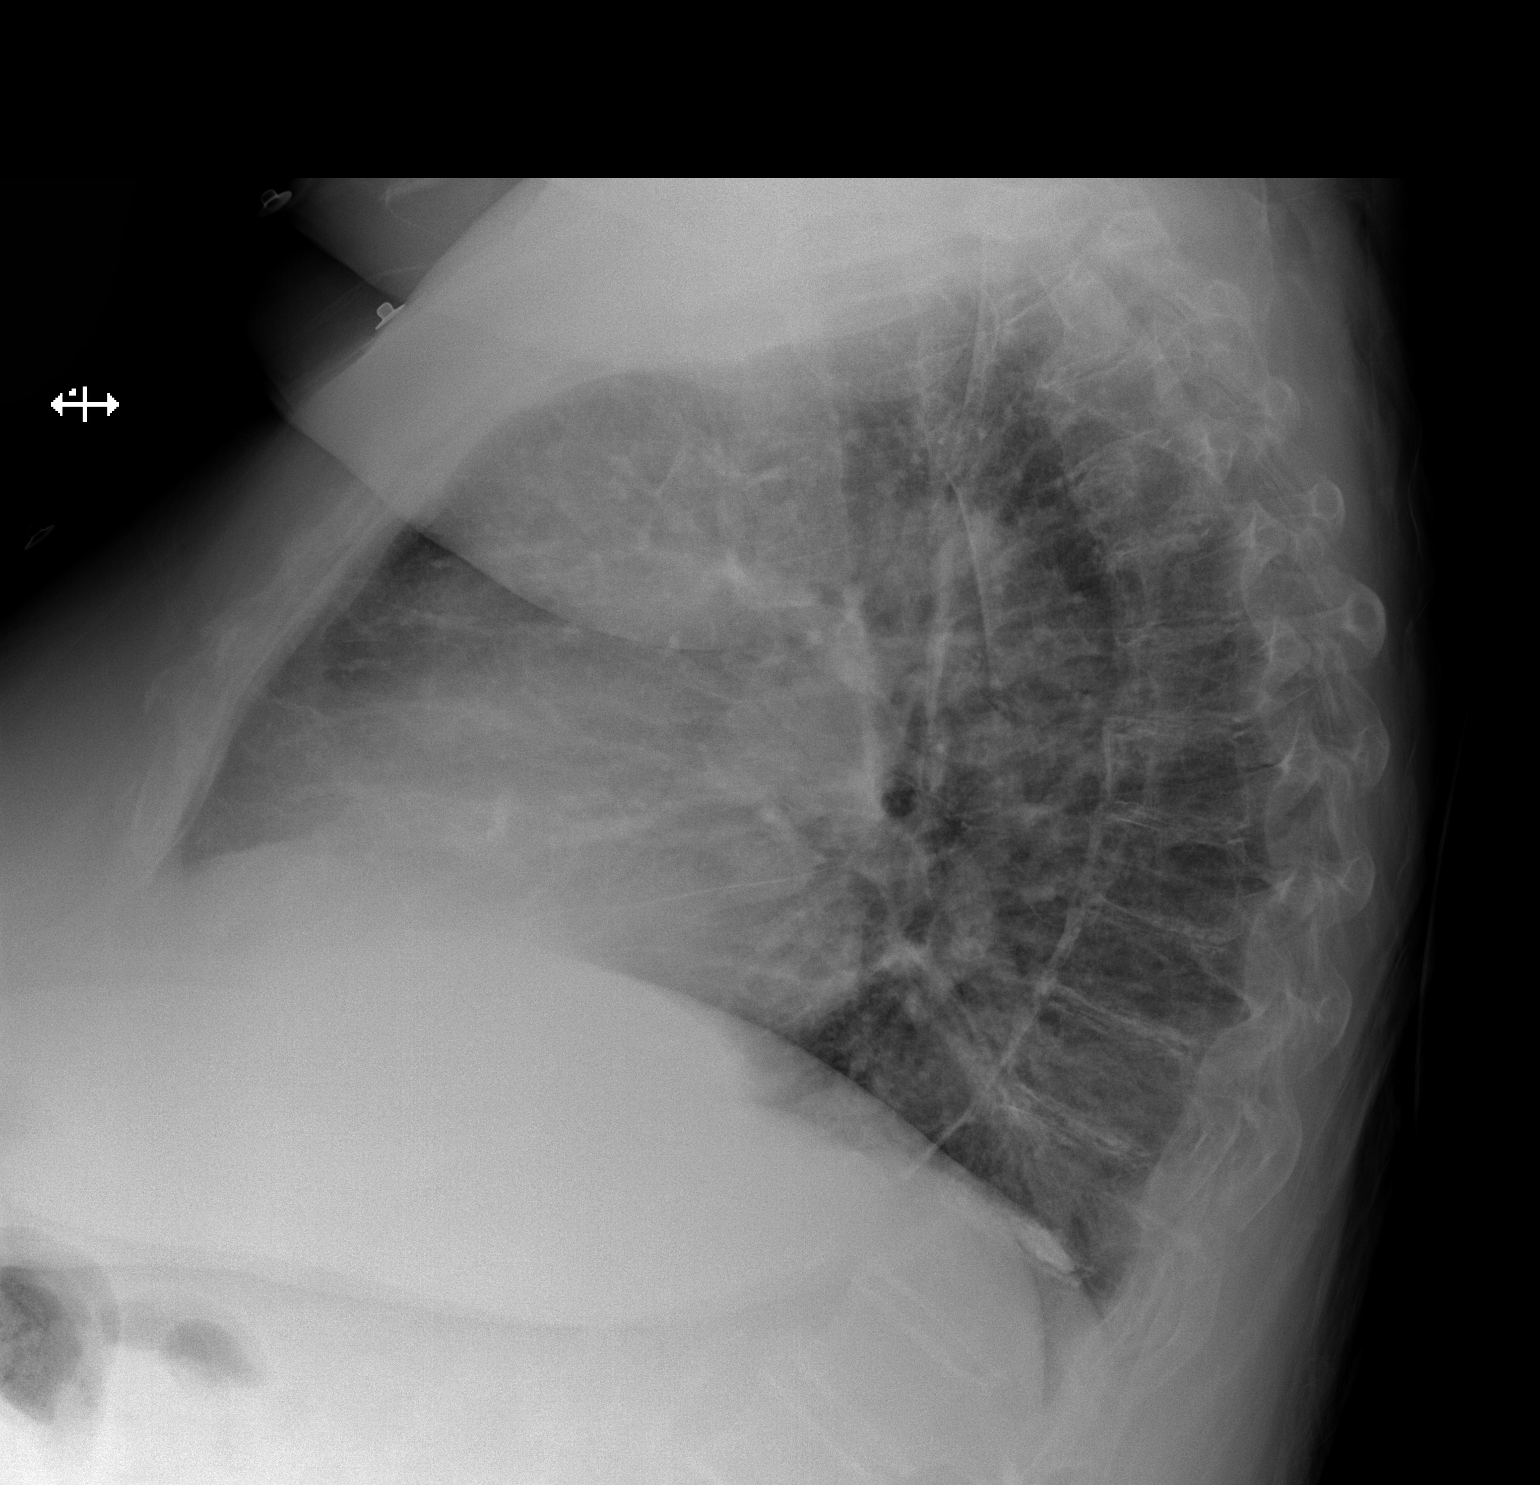

[2 of 2 positions shown; findings below may reference images not displayed]

FINDINGS: Stable cardiomegaly. Mediastinal and hilar contours are stable. The
pulmonary vascularity is normal. The lungs appear normally expanded
and clear. Negative for airspace disease, pleural effusion, or
pneumothorax. Bones appear osteopenic. No acute fracture is
visualized. Postoperative changes of right shoulder arthroplasty
noted.
IMPRESSION: Stable chest radiograph. No acute cardiopulmonary disease
identified.

## 2015-12-17 IMAGING — CR DG CHEST 2V
1 series · 2 of 2 positions shown · non-contrast
Comparison: 05/05/2014

CLINICAL DATA: Fall

EXAM:
CHEST - 2 VIEW

[Series 5: x chest ap · 0.14mm/px · 2 of 2 slices shown]
[im 1/2]
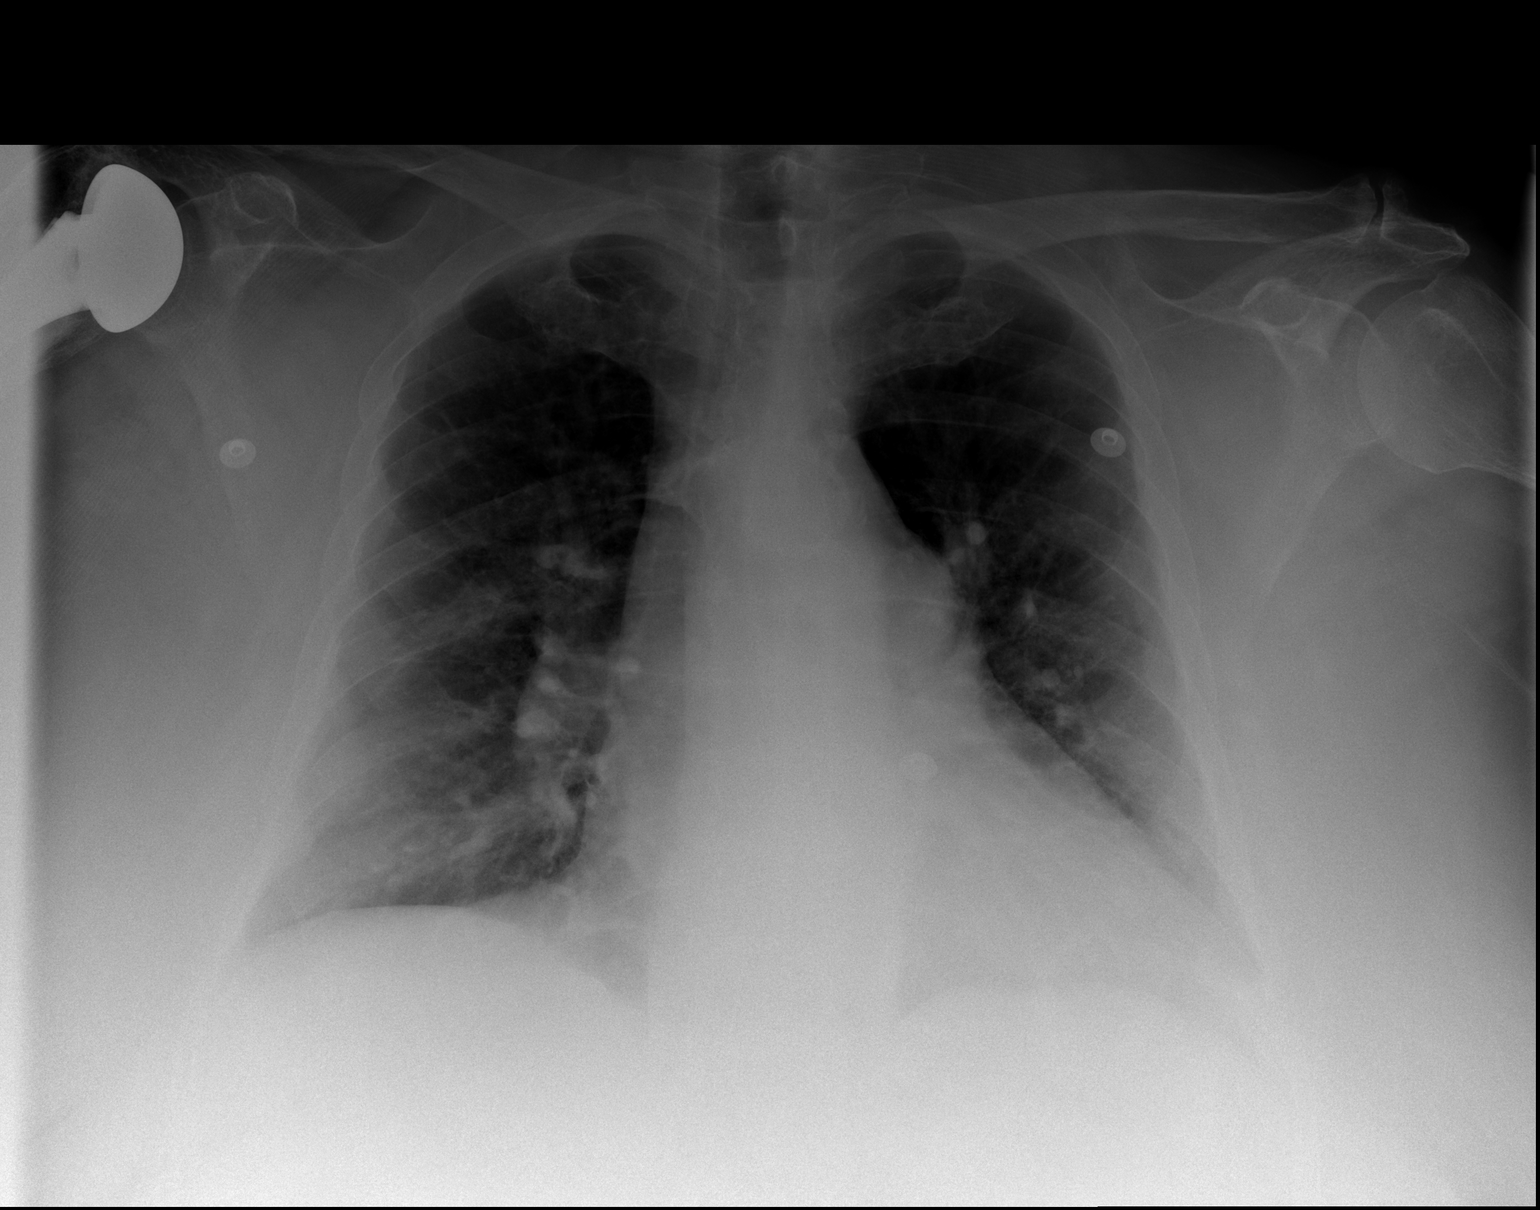
[im 2/2]
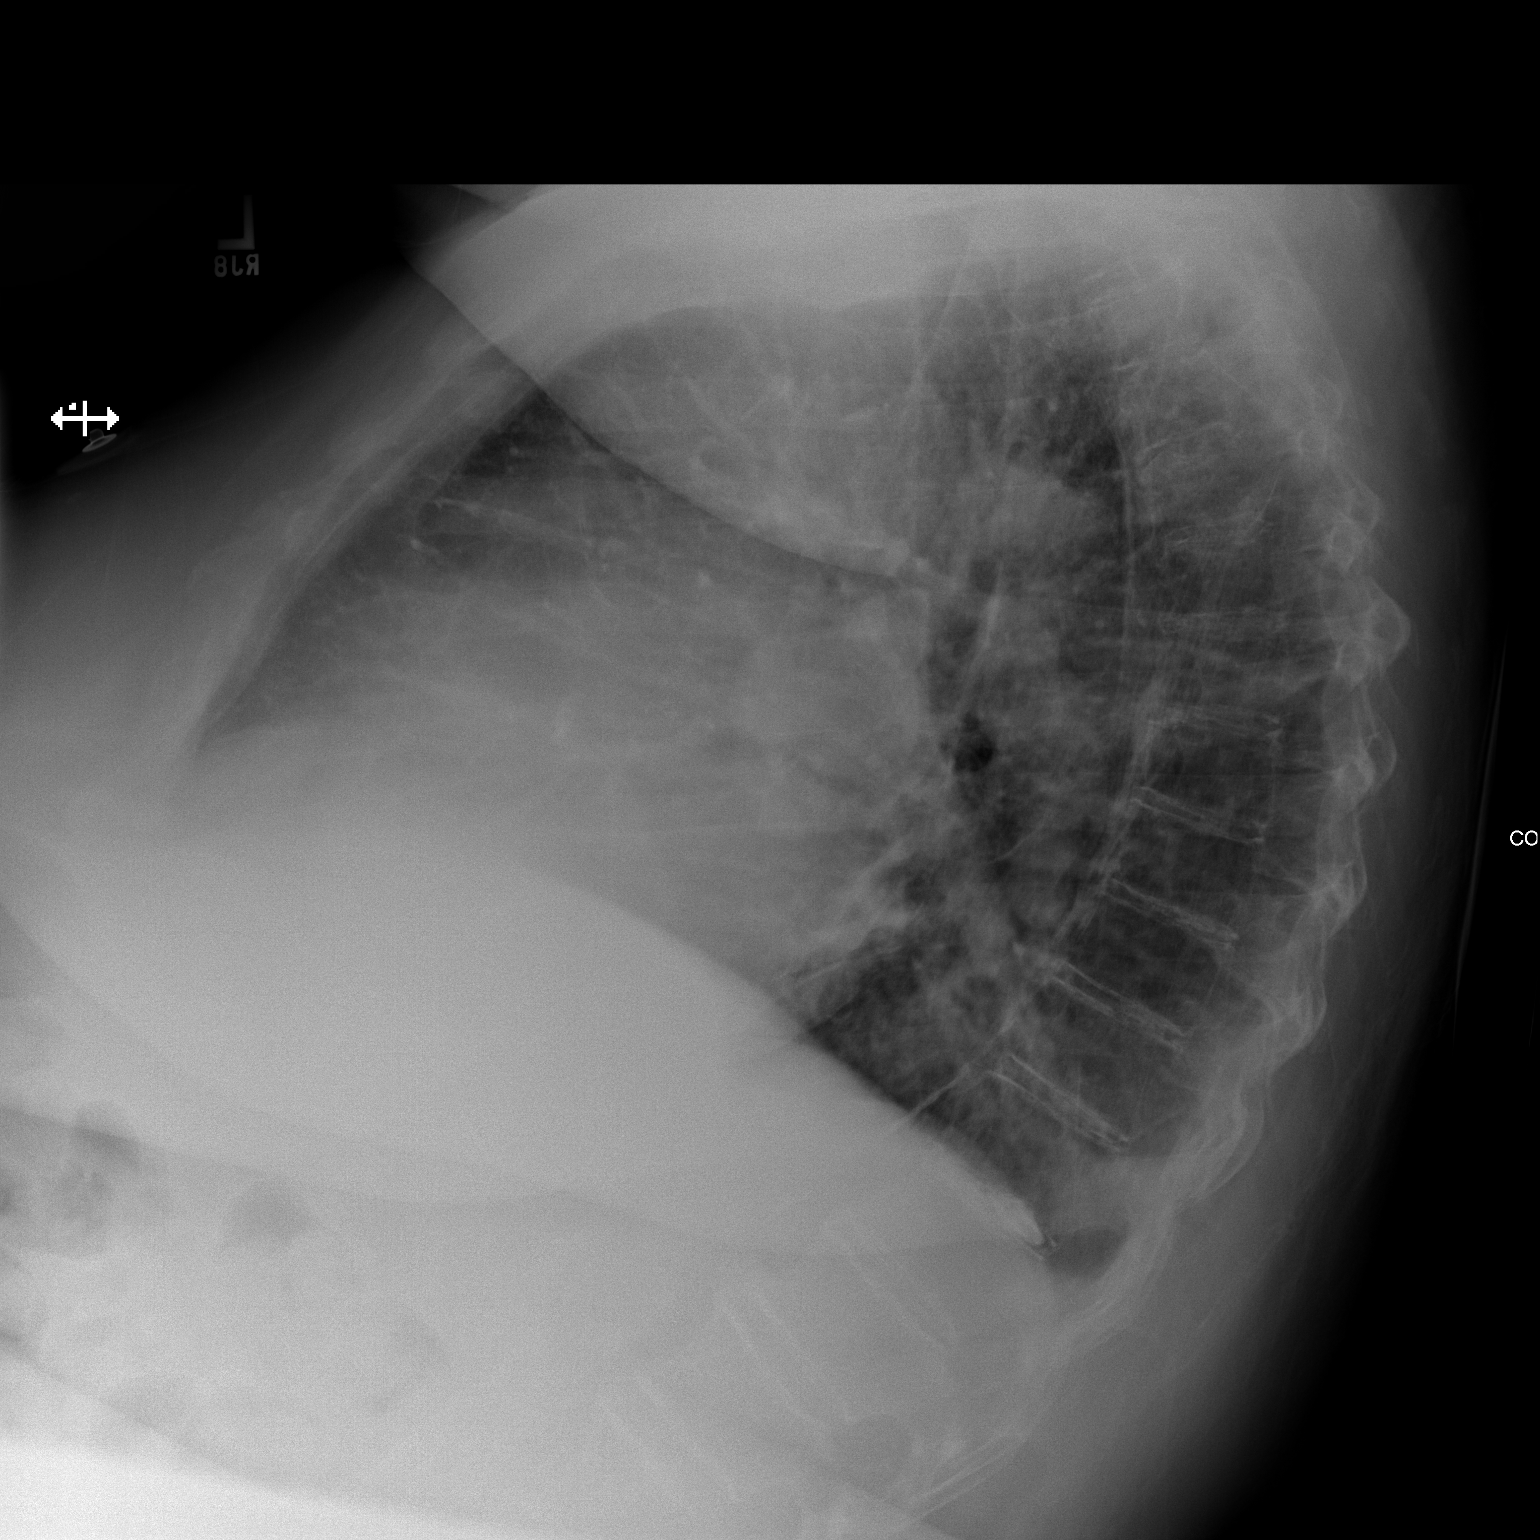

[2 of 2 positions shown; findings below may reference images not displayed]

FINDINGS: Moderate cardiomegaly. Low lung volumes. Bibasilar atelectasis. No
pneumothorax. No pleural effusion.
IMPRESSION: Cardiomegaly and low lung volumes with bibasilar atelectasis.

## 2015-12-18 IMAGING — CR DG CHEST 1V PORT
1 series · 1 of 1 positions shown · non-contrast
Comparison: Chest radiograph 05/09/2014

CLINICAL DATA: Short of breath, peripheral edema

EXAM:
PORTABLE CHEST - 1 VIEW

[ap]
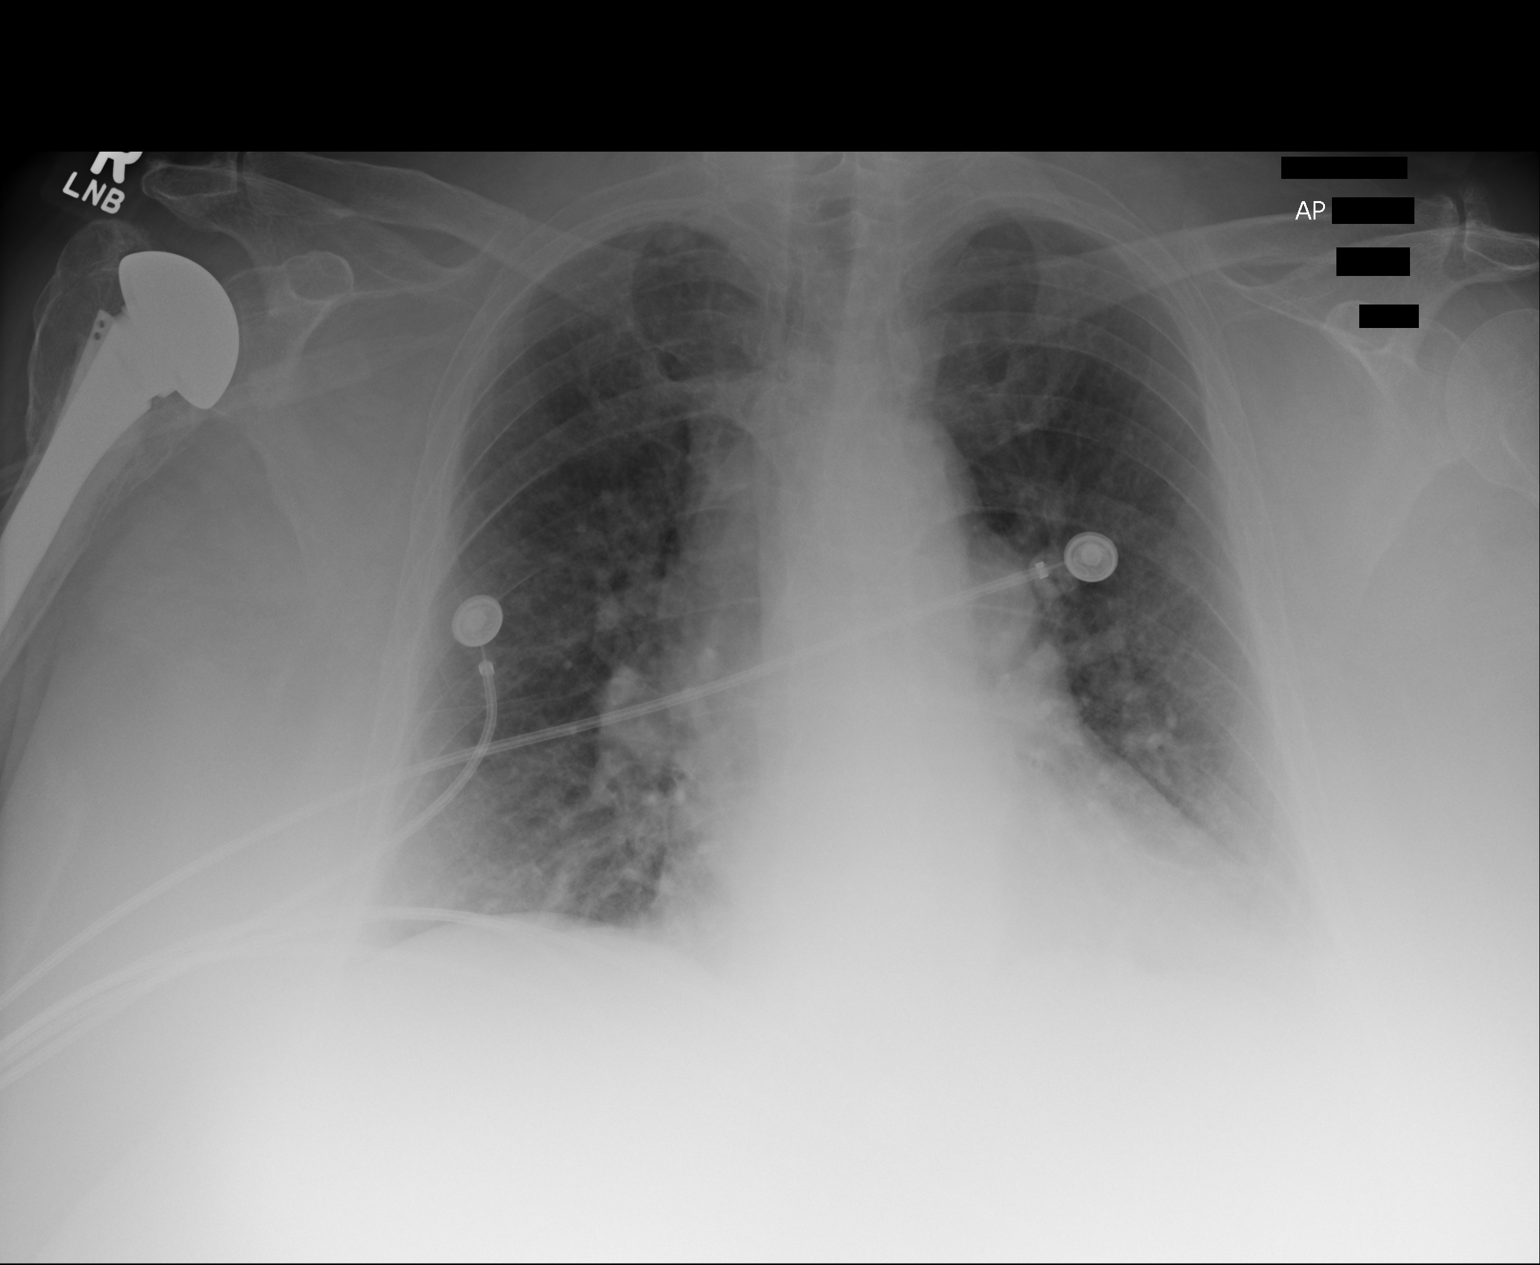

[1 of 1 positions shown; findings below may reference images not displayed]

FINDINGS: Cardiac silhouette is mildly enlarged. There is central venous
pulmonary congestion. . There is left basilar atelectasis similar
prior. No overt pulmonary edema. No pneumothorax.
IMPRESSION: 1. No interval change.
2. Left basilar atelectasis and central venous pulmonary congestion.

## 2015-12-27 IMAGING — CT CT HEAD WITHOUT CONTRAST
1 series · 16 of 30 positions shown, 20 images · non-contrast
Comparison: 02/02/2014.

CLINICAL DATA: Patient fell backwards hitting hand.
Lightheadedness. Head pain.

EXAM:
CT HEAD WITHOUT CONTRAST
TECHNIQUE: Contiguous axial images were obtained from the base of the skull
through the vertex without contrast.

[Series 2: head wo · axial · 0.40mm/px · z∈[+478,+622]mm · 16 of 36 slices shown, 20 images]
[im 2/36  brain]
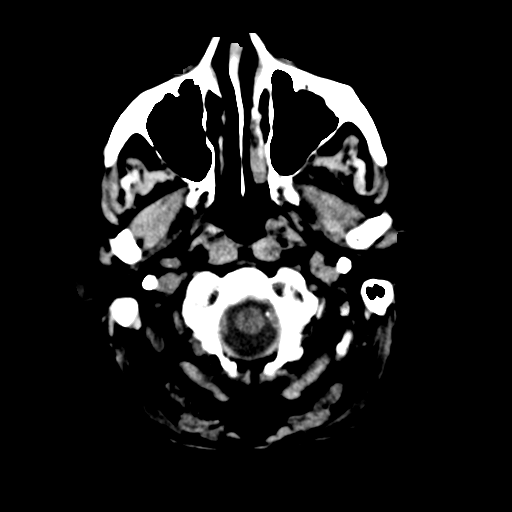
[im 2/36  bone]
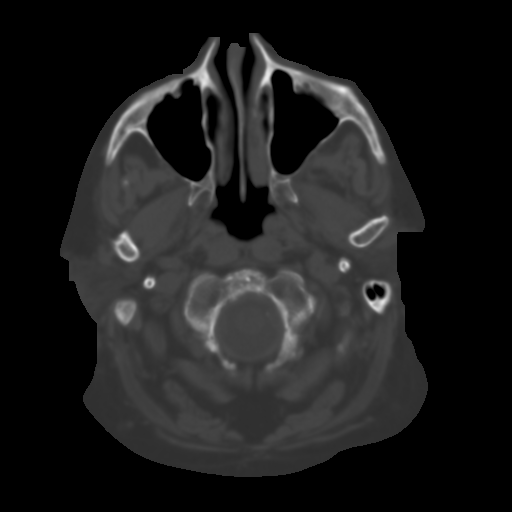
[im 4/36  brain]
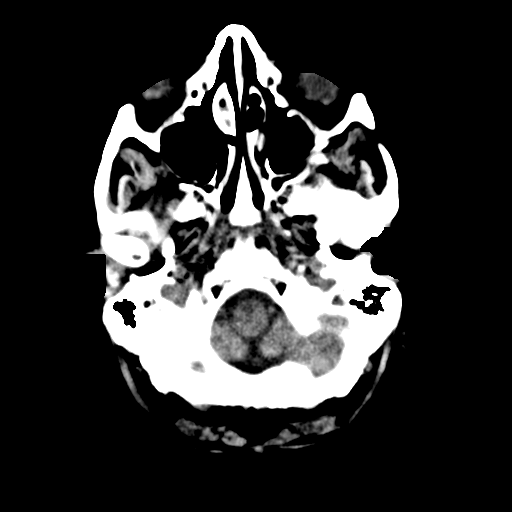
[im 7/36  brain]
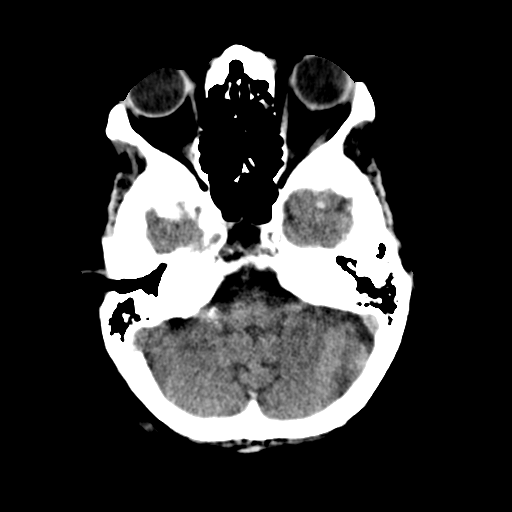
[im 9/36  brain]
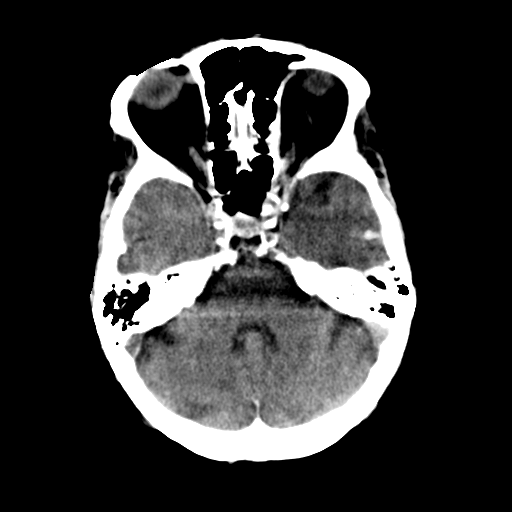
[im 10/36  brain]
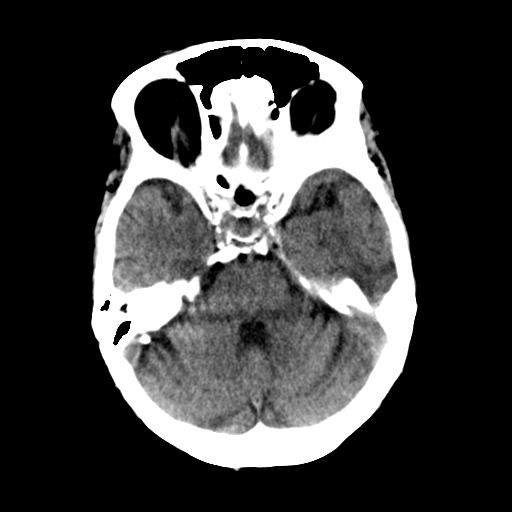
[im 10/36  bone]
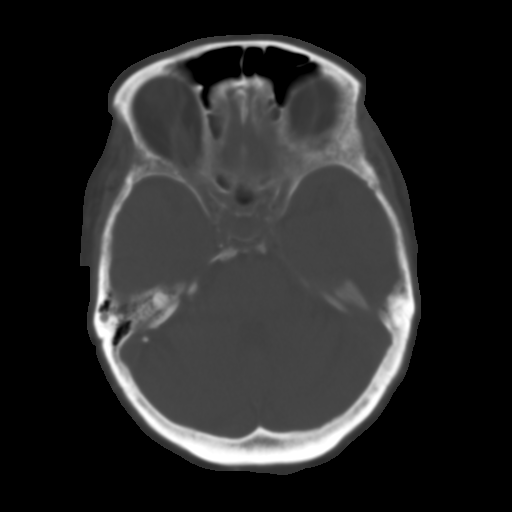
[im 13/36  brain]
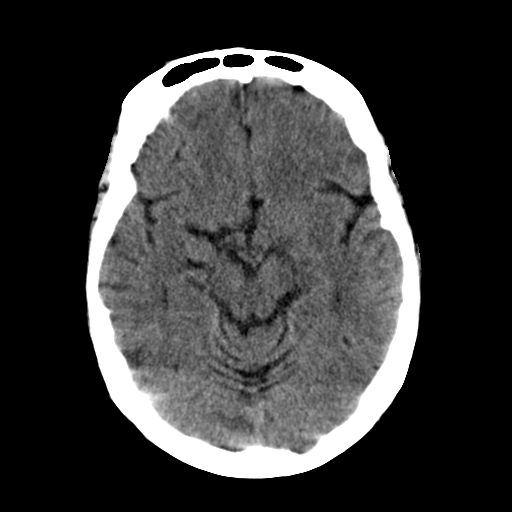
[im 15/36  brain]
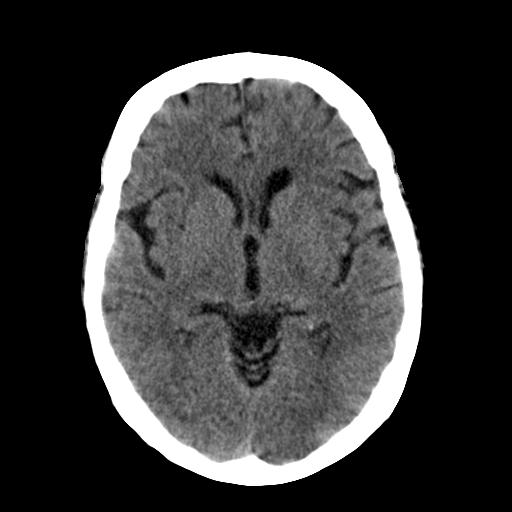
[im 17/36  brain]
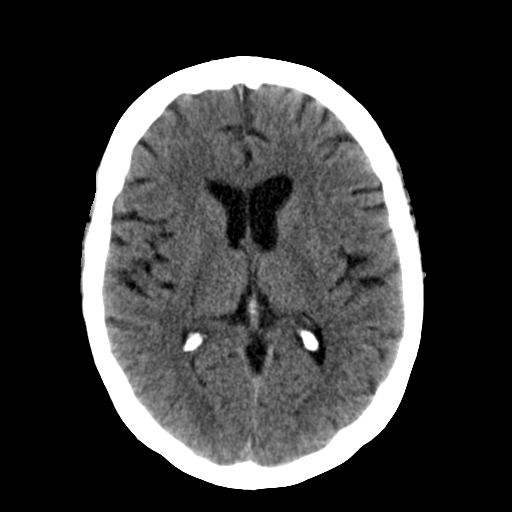
[im 19/36  brain]
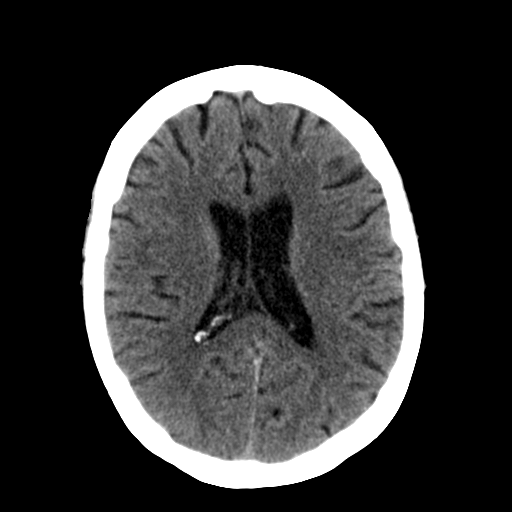
[im 19/36  bone]
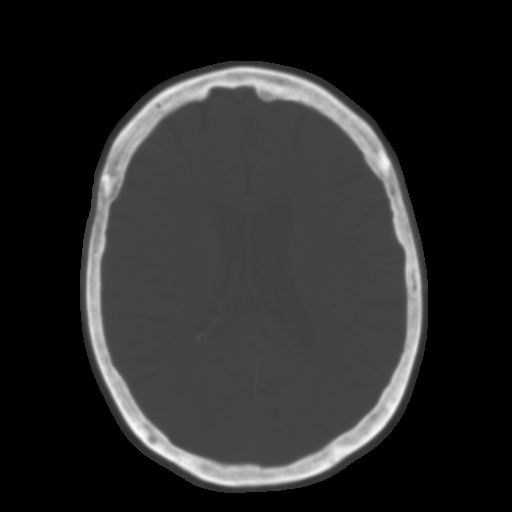
[im 21/36  brain]
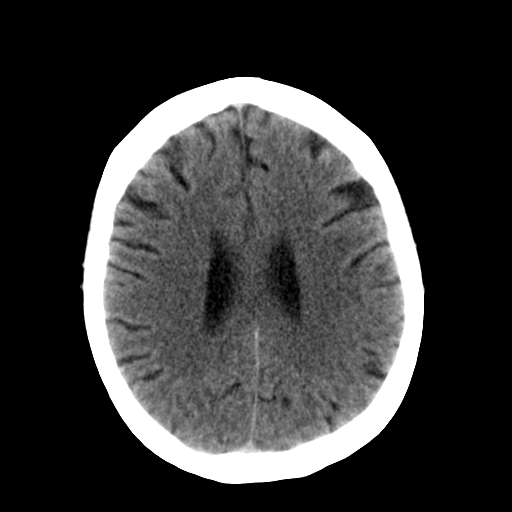
[im 23/36  brain]
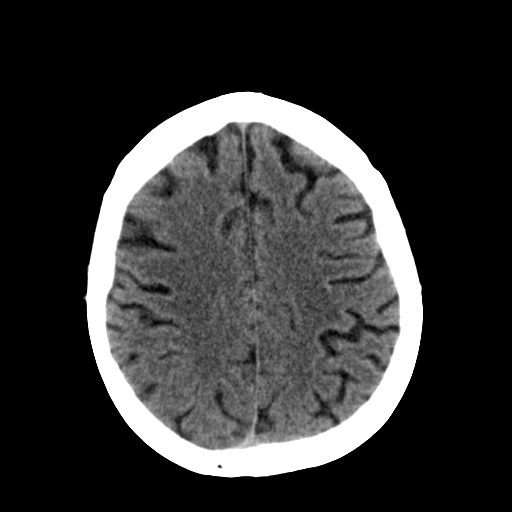
[im 26/36  brain]
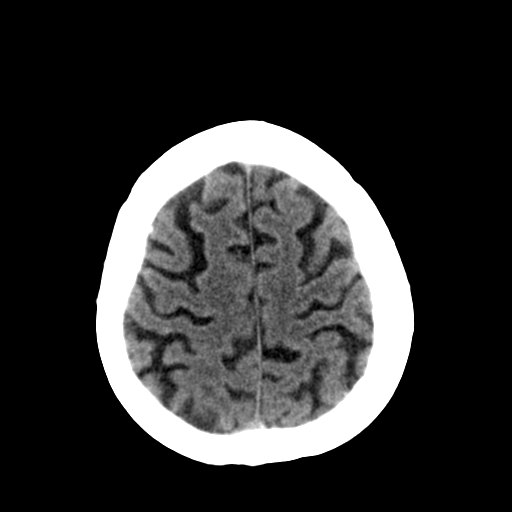
[im 27/36  brain]
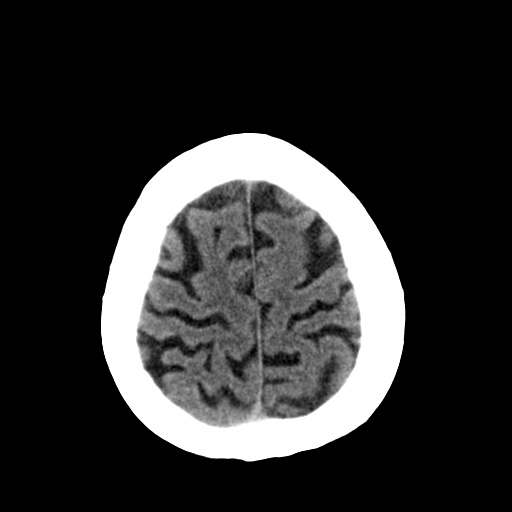
[im 27/36  bone]
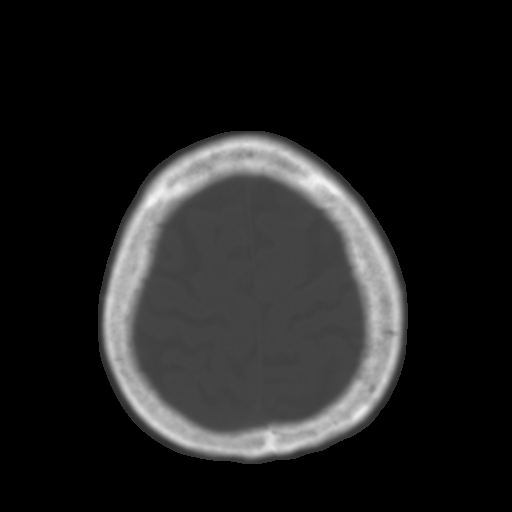
[im 29/36  brain]
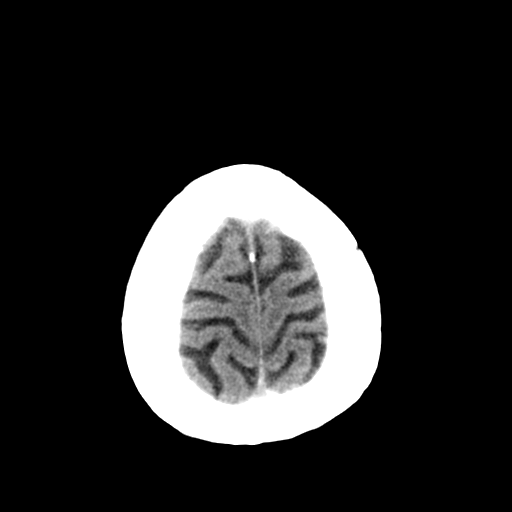
[im 32/36  brain]
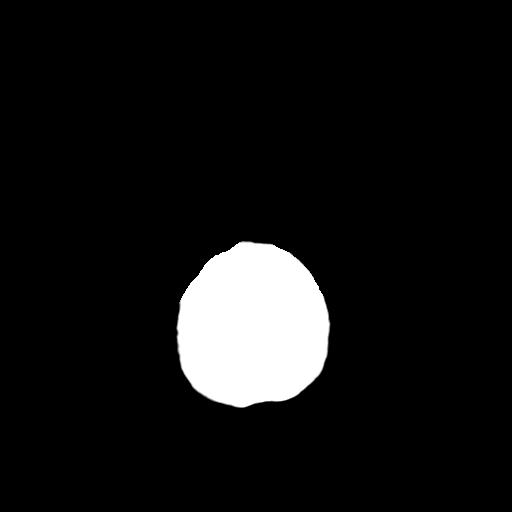
[im 34/36  brain]
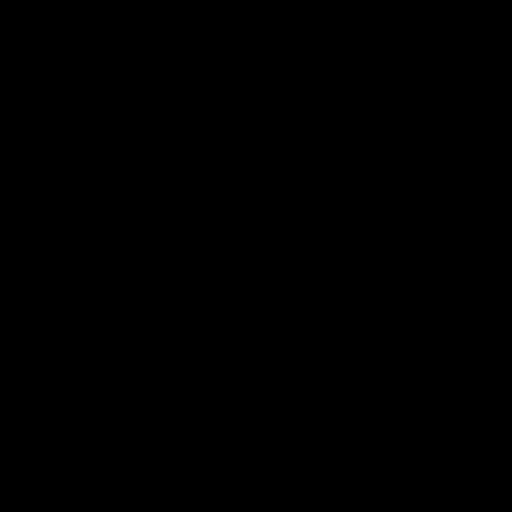

[16 of 30 positions shown; findings below may reference images not displayed]

FINDINGS: No evidence for acute infarction, hemorrhage, mass lesion,
hydrocephalus, or extra-axial fluid. Normal for age cerebral volume.
No significant white matter disease or large vessel infarct.
Calvarium intact. Vascular calcification. No sinus air-fluid level
or significant mastoid fluid. No significant scalp hematoma. Similar
appearance to priors.
IMPRESSION: No acute intracranial abnormality. No skull fracture or intracranial
hemorrhage.

## 2015-12-28 IMAGING — CR DG CHEST 1V PORT
1 series · 1 of 1 positions shown · non-contrast
Comparison: 05/10/2014

CLINICAL DATA: Right rib pain after a fall.  COPD.

EXAM:
PORTABLE CHEST - 1 VIEW

[ap]
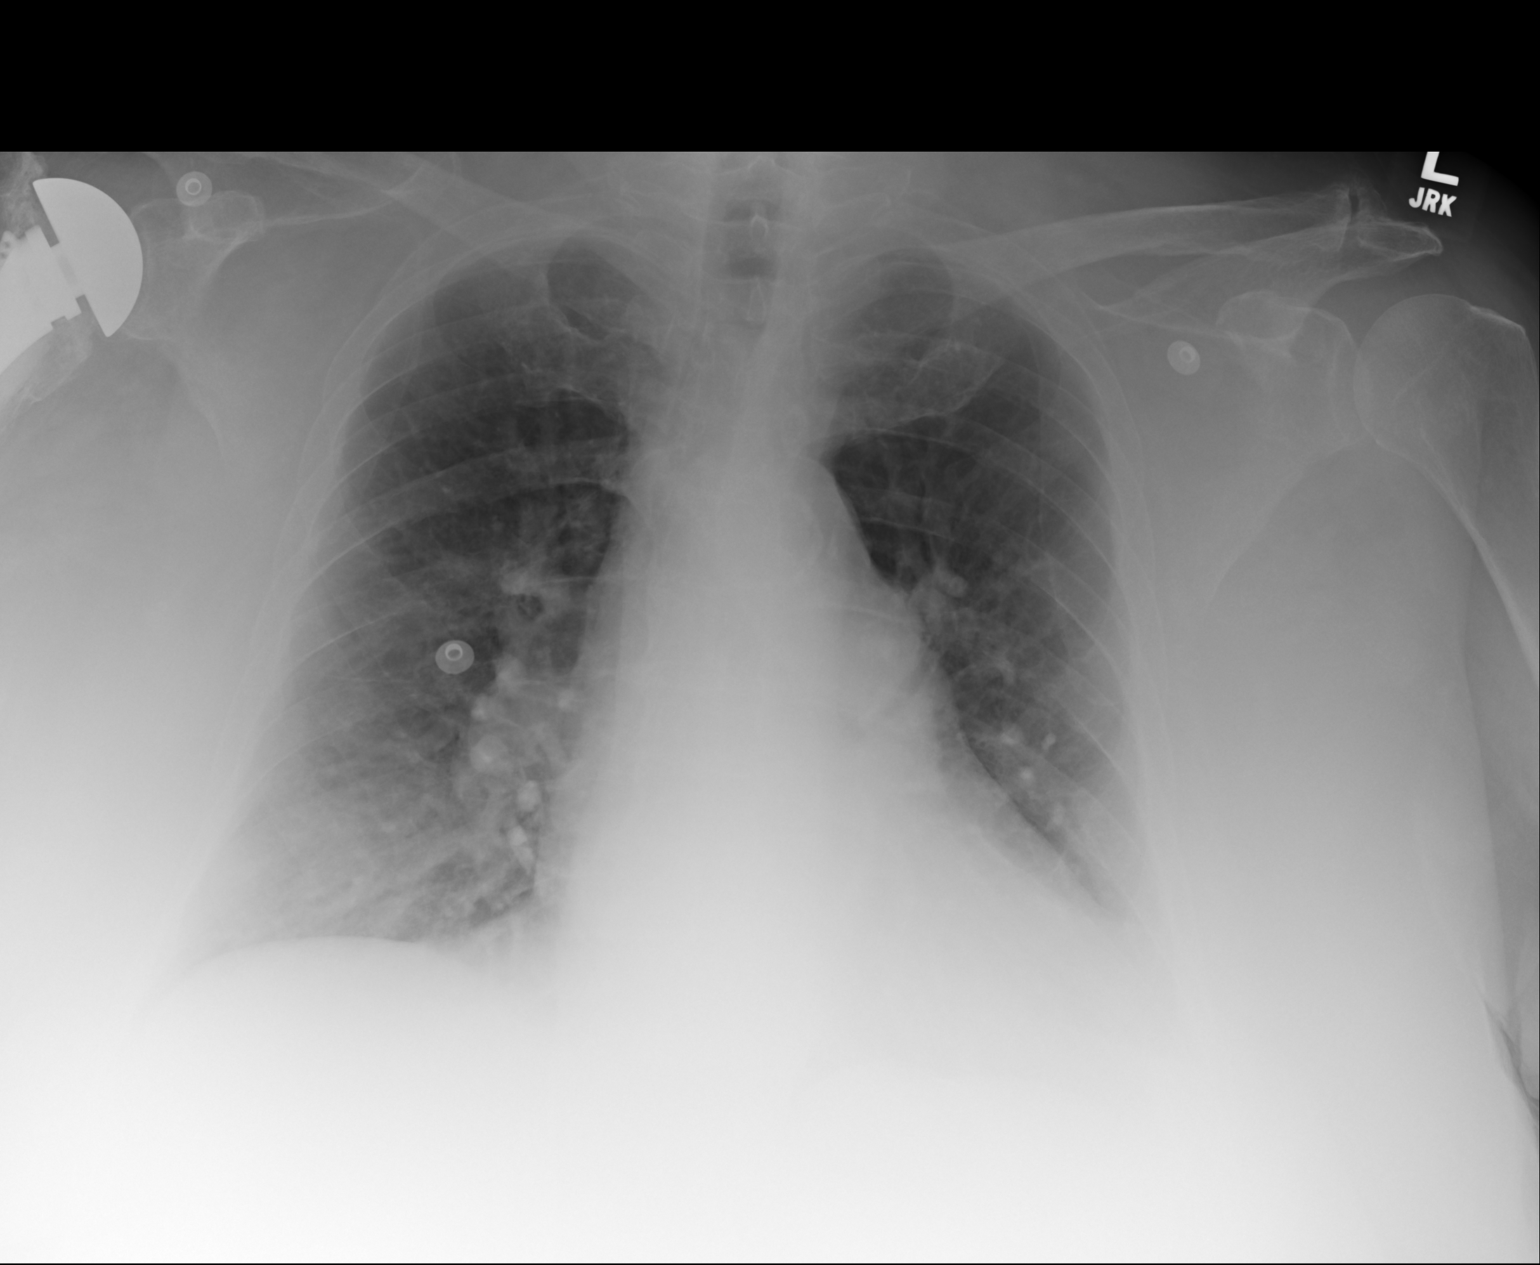

[1 of 1 positions shown; findings below may reference images not displayed]

FINDINGS: Shallow inspiration with atelectasis or infiltration in the left
lung base, similar to previous study. Mild cardiac enlargement. No
significant vascular congestion. Postoperative changes in the right
shoulder. Limited visualization of ribs demonstrates no significant
displacement.
IMPRESSION: Cardiac enlargement. Infiltration or atelectasis in the left lung
base. No significant change.

## 2016-02-07 ENCOUNTER — Encounter: Payer: Self-pay | Admitting: Emergency Medicine

## 2016-02-07 ENCOUNTER — Emergency Department
Admission: EM | Admit: 2016-02-07 | Discharge: 2016-02-07 | Disposition: A | Discharge status: Home / Self Care | Payer: Medicare (Managed Care) | Attending: Emergency Medicine | Admitting: Emergency Medicine

## 2016-02-07 DIAGNOSIS — N189 Chronic kidney disease, unspecified: Secondary | ICD-10-CM | POA: Diagnosis not present

## 2016-02-07 DIAGNOSIS — Z792 Long term (current) use of antibiotics: Secondary | ICD-10-CM | POA: Insufficient documentation

## 2016-02-07 DIAGNOSIS — Z87891 Personal history of nicotine dependence: Secondary | ICD-10-CM | POA: Insufficient documentation

## 2016-02-07 DIAGNOSIS — Z7952 Long term (current) use of systemic steroids: Secondary | ICD-10-CM | POA: Insufficient documentation

## 2016-02-07 DIAGNOSIS — I129 Hypertensive chronic kidney disease with stage 1 through stage 4 chronic kidney disease, or unspecified chronic kidney disease: Secondary | ICD-10-CM | POA: Diagnosis not present

## 2016-02-07 DIAGNOSIS — Z79899 Other long term (current) drug therapy: Secondary | ICD-10-CM | POA: Diagnosis not present

## 2016-02-07 DIAGNOSIS — I83891 Varicose veins of right lower extremities with other complications: Secondary | ICD-10-CM

## 2016-02-07 DIAGNOSIS — E119 Type 2 diabetes mellitus without complications: Secondary | ICD-10-CM | POA: Diagnosis not present

## 2016-02-07 DIAGNOSIS — I8391 Asymptomatic varicose veins of right lower extremity: Secondary | ICD-10-CM | POA: Insufficient documentation

## 2016-02-07 NOTE — Discharge Instructions (Signed)
Wear Ace wrap most of the time for about 1 week.  Skin glue was applied and will peel off in about a week.  Return to the emergency Department if you have any additional bleeding.   Bleeding Varicose Veins Varicose veins are veins that have become enlarged and twisted. Valves in the veins help return blood from the leg to the heart. If these valves are damaged, blood flows backward and backs up into the veins in the leg near the skin. This causes the veins to become larger because of increased pressure within them. Sometimes these veins bleed. CAUSES  Factors that can lead to bleeding varicose veins include:  Thinning of the skin that covers the veins. This skin is stretched as the veins enlarge.  Weak and thinning walls of the varicose veins. These thin walls are part of the reason why blood is not flowing normally to the heart.  Having high pressure in the veins. This high pressure occurs because the blood is not flowing freely back up to the heart.  Injury. Even a small injury to a varicose vein can cause bleeding.  Open wounds. A sore may develop near a varicose vein and not heal. This makes bleeding more likely.  Taking medicine that thins the blood. These medicines may include aspirin, anti-inflammatory medicine, and other blood thinners. SIGNS AND SYMPTOMS  If bleeding is on the outside surface of the skin, blood can be seen. Sometimes, the bleeding stays under the skin. If this happens, the blue or purple area will spread beyond the vein. This discoloration may be visible. DIAGNOSIS  To decide if you have a bleeding varicose vein, your health care provider may:  Ask about your symptoms. This will include when you first saw bleeding.  Ask about how long you have had varicose veins and if they cause you problems.  Ask about your overall health.  Ask about possible causes, such as recent cuts or if the area near the varicose veins was bumped or injured.  Examine the skin or  leg that concerns you. Your health care provider will probably feel the veins.  Order imaging tests. These create detailed pictures of the veins. TREATMENT  The first goal of treating bleeding varicose veins is to stop the bleeding. Then, the aim is to keep any bleeding from happening again. Treatment will depend on the cause of the bleeding and how bad it is. Ask your health care provider about what would be best for you. Options include:  Raising (elevating) your leg. Lie down with your leg propped up on a pillow or cushion. Your foot should be above the level of your heart.  Applying pressure to the spot that is bleeding. The bleeding should stop in a short time.  Wearing elastic stockings that "compress" your legs (compression stockings). An elastic bandage may do the same thing.  Applying an antibiotic cream on sores that are not healing.  Closing off or surgically removing the bleeding varicose veins with one of the following:  Sclerotherapy. A solution is injected into the vein to close it off.  Laser treatment. A laser is used to heat the vein to close it off.  Radiofrequency vein ablation. An electrical current produced by radio waves is used to close off the vein.  Phlebectomy. The vein is surgically removed through small incisions made over the varicose vein.  Vein ligation and stripping. The vein is surgically removed through incisions made over the varicose vein after the vein has been tied (ligated).  HOME CARE INSTRUCTIONS   Apply any creams that your health care provider prescribed. Follow the directions carefully.  Wear compression stockings or any wraps as directed by your health care provider. Make sure you know:  If you should wear them every day.  How long you should wear them.  If veins were removed or closed, a bandage (dressing) will probably cover the area. Make sure you know:  How often the dressing should be changed.  Whether the area can get  wet.  When you can leave the skin uncovered.  Check your skin every day. Look for new sores and signs of bleeding.  To prevent future bleeding:  Use extra care in situations where you could cut your legs, such as when shaving or gardening.  Try to keep your legs elevated as much as possible. Lie down when you can. SEEK MEDICAL CARE IF:   Your veins continue to bleed.  You develop new sores near your varicose veins.  You have a sore that does not heal or gets bigger.  You have increased pain in your leg.  The area around a varicose vein becomes warm, red, or tender to the touch.  You notice a yellowish fluid that smells bad coming from a spot where there was bleeding.  You have a fever. SEEK IMMEDIATE MEDICAL CARE IF:   You have chest pain or difficulty breathing.  You have severe leg pain.   This information is not intended to replace advice given to you by your health care provider. Make sure you discuss any questions you have with your health care provider.   Document Released: 04/17/2009 Document Revised: 12/20/2014 Document Reviewed: 04/02/2014 Elsevier Interactive Patient Education [employer].

## 2016-02-07 NOTE — ED Notes (Signed)
Patient brought in by [facility] from home for a bleeding varicose vein on the right lower leg, just above the ankle

## 2016-02-07 NOTE — ED Provider Notes (Signed)
[facility] Emergency Department Provider Note   ____________________________________________  Time seen: Approximately 9 AM I have reviewed the triage vital signs and the triage nursing note.  HISTORY  Chief Complaint Bleeding from varicose vein    Historian Patient  HPI Sara Pierce is a 75 y.o. female who had a bleeding varicose vein to her right ankle yesterday and had it wrapped, but when she unwrapped at this morning it did start eating again. Symptoms are mild. No pain. No injury. No other bleeding issues.    Past Medical History  Diagnosis Date  . COPD (chronic obstructive pulmonary disease) ([place])   . Clotting disorder ([place])   . Chronic kidney disease   . Depression   . Cardiomyopathy ([place])   . Arthritis   . Cataract   . Cerebral hemorrhage ([place])   . Diabetes mellitus without complication ([place])   . Hypertension   . Obesity   . Sleep apnea   . Urinary frequency   . Vaginal atrophy   . Varicose veins   . Mixed incontinence   . Thyroid disease     Patient Active Problem List   Diagnosis Date Noted  . Sepsis ([place]) 11/15/2015  . Community acquired pneumonia 11/15/2015  . Absolute anemia 03/15/2015  . Anxiety 03/15/2015  . Arthritis 03/15/2015  . Cardiomyopathy, secondary ([place]) 03/15/2015  . Cataract 03/15/2015  . Brain bleed ([place]) 03/15/2015  . Chronic constipation 03/15/2015  . Chronic kidney failure 03/15/2015  . Blood clotting disorder ([place]) 03/15/2015  . CAFL (chronic airflow limitation) ([place]) 03/15/2015  . Clinical depression 03/15/2015  . Diabetes ([place]) 03/15/2015  . H/O varicella 03/15/2015  . BP (high blood pressure) 03/15/2015  . Mixed incontinence 03/15/2015  . 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 Adiposity 03/15/2015  . Apnea, sleep 03/15/2015  . Disease of thyroid gland 03/15/2015  . FOM (frequency of micturition) 03/15/2015  . 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 Candida  vaginitis 03/15/2015  . Abnormal ECG 03/15/2015    Past Surgical History  Procedure Laterality Date  . Appendectomy    . Cardiac catherization    . Pacemaker insertion    . Joint replacement    . Bilateral oophorectomy    . Spine surgery    . Strabismus surgery    . Tonsillectomy    . Tubal ligation      Current Outpatient Rx  Name  Route  Sig  Dispense  Refill  . albuterol (PROVENTIL HFA;VENTOLIN HFA) 108 (90 BASE) MCG/ACT inhaler   Inhalation   Inhale 2 puffs into the lungs every 6 (six) hours as needed for wheezing.          . benzonatate (TESSALON) 200 MG capsule   Oral   Take 200 mg by mouth 3 (three) times daily as needed for cough.         Marland Kitchen buPROPion (WELLBUTRIN XL) 300 MG 24 hr tablet   Oral   Take 300 mg by mouth daily.          . carvedilol (COREG) 6.25 MG tablet   Oral   Take 6.25 mg by mouth 2 (two) times daily.          . clonazePAM (KLONOPIN) 0.5 MG tablet   Oral   Take 1 tablet (0.5 mg total) by mouth 3 (three) times daily as needed (Anxiety).   20 tablet   0   . clotrimazole (LOTRIMIN) 1 % cream   Topical  Apply 1 application topically 2 (two) times daily.         . clotrimazole-betamethasone (LOTRISONE) cream   Topical   Apply 1 application topically 2 (two) times daily.         Marland Kitchen estradiol (ESTRACE) 0.1 MG/GM vaginal cream   Vaginal   Place 1 Applicatorful vaginally 3 (three) times a week. Apply a pea-sized amount with tip of finger on Monday, Wednesday, and Friday night         . FLUoxetine (PROZAC) 40 MG capsule   Oral   Take 40 mg by mouth daily.         . Fluticasone-Salmeterol (ADVAIR) 500-50 MCG/DOSE AEPB   Inhalation   Inhale 1 puff into the lungs 2 (two) times daily.         . furosemide (LASIX) 20 MG tablet   Oral   Take 1 tablet (20 mg total) by mouth daily.   30 tablet   0   . guaiFENesin (MUCINEX) 600 MG 12 hr tablet   Oral   Take 1 tablet (600 mg total) by mouth 2 (two) times daily.   60 tablet    0   . hydrocortisone cream 1 %   Topical   Apply 1 application topically 2 (two) times daily as needed for itching.         Marland Kitchen ipratropium-albuterol (DUONEB) 0.5-2.5 (3) MG/3ML SOLN   Inhalation   Inhale 3 mLs into the lungs 4 (four) times daily as needed (wheezing).          Marland Kitchen liothyronine (CYTOMEL) 25 MCG tablet   Oral   Take 25 mcg by mouth daily.          . magnesium oxide (MAG-OX) 400 MG tablet   Oral   Take 400 mg by mouth daily.          . montelukast (SINGULAIR) 10 MG tablet   Oral   Take 10 mg by mouth at bedtime.          Marland Kitchen nystatin (MYCOSTATIN/NYSTOP) 100000 UNIT/GM POWD   Topical   Apply topically 2 (two) times daily as needed (rash). 1 application         . oxyCODONE-acetaminophen (ROXICET) 5-325 MG per tablet   Oral   Take 1 tablet by mouth every 6 (six) hours as needed.   20 tablet   0   . potassium chloride (K-DUR) 10 MEQ tablet   Oral   Take 10 mEq by mouth daily.          . predniSONE (STERAPRED UNI-PAK 21 TAB) 10 MG (21) TBPK tablet   Oral   Take 1 tablet (10 mg total) by mouth daily. 5 tabs PO x 1 day 4 tabs PO x 1 day 3 tabs PO x 1 day 2 tabs PO x 1 day 1 tab PO x 1 day and stop   15 tablet   0   . primidone (MYSOLINE) 50 MG tablet   Oral   Take 50 mg by mouth 2 (two) times daily.          . simvastatin (ZOCOR) 40 MG tablet   Oral   Take 40 mg by mouth at bedtime.          . solifenacin (VESICARE) 10 MG tablet   Oral   Take 10 mg by mouth daily.         Marland Kitchen spironolactone (ALDACTONE) 25 MG tablet   Oral   Take 1 tablet (25 mg total) by  mouth daily.   30 tablet   1   . tiotropium (SPIRIVA) 18 MCG inhalation capsule   Inhalation   Place 18 mcg into inhaler and inhale daily.          . traZODone (DESYREL) 50 MG tablet   Oral   Take 50 mg by mouth at bedtime as needed for sleep.         Marland Kitchen triamcinolone (KENALOG) 0.025 % cream   Topical   Apply 1 application topically as needed (itchiness).         .  Vitamin D, Ergocalciferol, (DRISDOL) 50000 UNITS CAPS capsule   Oral   Take 50,000 Units by mouth every 30 (thirty) days. On the first of each month           Allergies Aspirin; Codeine; and Tape  Family History  Problem Relation Age of Onset  . Heart attack Father   . Coronary artery disease Father   . Hypertension Father   . Hyperlipidemia Father   . Heart attack Mother   . Hypertension Mother   . Hyperlipidemia Mother     Social History Social History  Substance Use Topics  . Smoking status: Former Research scientist (life sciences)  . Smokeless tobacco: None  . Alcohol Use: No     Comment: occasional    Review of Systems  Constitutional: Negative for fever. Eyes: Negative for visual changes. ENT: Negative for sore throat. Cardiovascular: Negative for chest pain. Respiratory: Negative for shortness of breath. Gastrointestinal: Negative for abdominal pain, vomiting and diarrhea. Genitourinary: Negative for dysuria. Musculoskeletal: Negative for back pain. Skin: Negative for rash. Neurological: Negative for headache. 10 point Review of Systems otherwise negative ____________________________________________   PHYSICAL EXAM:  VITAL SIGNS: ED Triage Vitals  Enc Vitals Group     BP 02/07/16 0858 128/58 mmHg     Pulse Rate 02/07/16 0858 62     Resp 02/07/16 0858 16     Temp 02/07/16 0858 98 F (36.7 C)     Temp Source 02/07/16 0858 Oral     SpO2 02/07/16 0858 96 %     Weight --      Height --      Head Cir --      Peak Flow --      Pain Score 02/07/16 0859 0     Pain Loc --      Pain Edu? --      Excl. in [place]? --      Constitutional: Alert and oriented. Well appearing and in no distress. HEENT   Head: Normocephalic and atraumatic.      Eyes: Conjunctivae are normal. PERRL. Normal extraocular movements.      Ears:         Nose: No congestion/rhinnorhea.   Mouth/Throat: Mucous membranes are moist.   Neck: No stridor. Cardiovascular/Chest: Normal peripheral  pulses. Respiratory: Normal respiratory effort without tachypnea nor retractions.  Gastrointestinal:  obese Genitourinary/rectal:Deferred Musculoskeletal: Nontender with normal range of motion in all extremities. No joint effusions.  No lower extremity tendernvaricose veins bilateral x-rays. Bleeding right medial ankle varicose vein Neurologic:  Normal speech and language. No gross or focal neurologic deficits are appreciated. Skin:  Skin is warm, dry and intact. No rash noted. Psychiatric: Mood and affect are normal. Speech and behavior are normal. Patient exhibits appropriate insight and judgment.  ____________________________________________   EKG I, Lisa Roca, MD, the attending physician have personally viewed and interpreted all ECGs.  none  ____________________________________________  LABS (pertinent positives/negatives)none  ____________________________________________  RADIOLOGY All Xrays were viewed by me. Imaging interpreted by Radiologist.  None __________________________________________  PROCEDURES  Procedure(s) performed:   Skin glue applied to temporarily controlled varicose vein opening at the right medial ankle. Ace wrap applied after skin glue is dry.  Critical Care performed: None  ____________________________________________   ED COURSE / ASSESSMENT AND PLAN  Pertinent labs & imaging results that were available during my care of the patient were reviewed by me and considered in my medical decision making (see chart for details).    pressured and hemostatic control of varicose pain. Skin glue was applied over the skin opening. Ace wrap applied for pressure.     CONSULTATIONS:    None   Patient / Family / Caregiver informed of clinical course, medical decision-making process, and agree with plan.   I discussed return precautions, follow-up instructions, and discharged instructions with patient and/or  family.   ___________________________________________   FINAL CLINICAL IMPRESSION(S) / ED DIAGNOSES   Final diagnoses:  Bleeding from varicose vein, right              Note: This dictation was prepared with Dragon dictation. Any transcriptional errors that result from this process are unintentional   Lisa Roca, MD 02/07/16 [phone]

## 2016-02-07 NOTE — ED Notes (Signed)
Bleeding has soaked through 2 dressings. MD currently at beside applying Dermabond to area.

## 2016-02-07 NOTE — ED Notes (Signed)
Gauze wrap and ace wrap applied to right ankle by Lynelle Smoke, EMT-P

## 2016-04-28 ENCOUNTER — Encounter: Payer: Self-pay | Admitting: *Deleted

## 2016-04-28 ENCOUNTER — Ambulatory Visit
Admission: RE | Admit: 2016-04-28 | Discharge: 2016-04-28 | Disposition: A | Discharge status: Home / Self Care | Payer: Medicare (Managed Care) | Source: Ambulatory Visit | Attending: Unknown Physician Specialty | Admitting: Unknown Physician Specialty

## 2016-04-28 ENCOUNTER — Encounter
Admission: RE | Disposition: A | Discharge status: Home / Self Care | Payer: Self-pay | Source: Ambulatory Visit | Attending: Unknown Physician Specialty

## 2016-04-28 ENCOUNTER — Ambulatory Visit: Payer: Medicare (Managed Care) | Admitting: Anesthesiology

## 2016-04-28 DIAGNOSIS — Z9660 Presence of unspecified orthopedic joint implant: Secondary | ICD-10-CM | POA: Diagnosis not present

## 2016-04-28 DIAGNOSIS — I129 Hypertensive chronic kidney disease with stage 1 through stage 4 chronic kidney disease, or unspecified chronic kidney disease: Secondary | ICD-10-CM | POA: Insufficient documentation

## 2016-04-28 DIAGNOSIS — E119 Type 2 diabetes mellitus without complications: Secondary | ICD-10-CM | POA: Diagnosis not present

## 2016-04-28 DIAGNOSIS — Z1211 Encounter for screening for malignant neoplasm of colon: Secondary | ICD-10-CM | POA: Insufficient documentation

## 2016-04-28 DIAGNOSIS — E079 Disorder of thyroid, unspecified: Secondary | ICD-10-CM | POA: Insufficient documentation

## 2016-04-28 DIAGNOSIS — K635 Polyp of colon: Secondary | ICD-10-CM | POA: Insufficient documentation

## 2016-04-28 DIAGNOSIS — G4730 Sleep apnea, unspecified: Secondary | ICD-10-CM | POA: Diagnosis not present

## 2016-04-28 DIAGNOSIS — F329 Major depressive disorder, single episode, unspecified: Secondary | ICD-10-CM | POA: Insufficient documentation

## 2016-04-28 DIAGNOSIS — Z87891 Personal history of nicotine dependence: Secondary | ICD-10-CM | POA: Diagnosis not present

## 2016-04-28 DIAGNOSIS — F419 Anxiety disorder, unspecified: Secondary | ICD-10-CM | POA: Diagnosis not present

## 2016-04-28 DIAGNOSIS — E669 Obesity, unspecified: Secondary | ICD-10-CM | POA: Insufficient documentation

## 2016-04-28 DIAGNOSIS — G709 Myoneural disorder, unspecified: Secondary | ICD-10-CM | POA: Diagnosis not present

## 2016-04-28 DIAGNOSIS — Z79899 Other long term (current) drug therapy: Secondary | ICD-10-CM | POA: Insufficient documentation

## 2016-04-28 DIAGNOSIS — J449 Chronic obstructive pulmonary disease, unspecified: Secondary | ICD-10-CM | POA: Insufficient documentation

## 2016-04-28 DIAGNOSIS — Z7951 Long term (current) use of inhaled steroids: Secondary | ICD-10-CM | POA: Diagnosis not present

## 2016-04-28 DIAGNOSIS — M1990 Unspecified osteoarthritis, unspecified site: Secondary | ICD-10-CM | POA: Diagnosis not present

## 2016-04-28 DIAGNOSIS — Z950 Presence of cardiac pacemaker: Secondary | ICD-10-CM | POA: Insufficient documentation

## 2016-04-28 HISTORY — PX: COLONOSCOPY WITH PROPOFOL: SHX5780

## 2016-04-28 SURGERY — COLONOSCOPY WITH PROPOFOL
Anesthesia: General

## 2016-04-28 MED ORDER — IPRATROPIUM-ALBUTEROL 0.5-2.5 (3) MG/3ML IN SOLN
3.0000 mL | Freq: Four times a day (QID) | RESPIRATORY_TRACT | Status: DC
Start: 2016-04-28 — End: 2016-04-28
  Administered 2016-04-28: 3 mL via RESPIRATORY_TRACT

## 2016-04-28 MED ORDER — PROPOFOL 500 MG/50ML IV EMUL
INTRAVENOUS | Status: DC | PRN
  Administered 2016-04-28: 120 ug/kg/min via INTRAVENOUS

## 2016-04-28 MED ORDER — SODIUM CHLORIDE 0.9 % IV SOLN
INTRAVENOUS | Status: DC
  Administered 2016-04-28: 12:00:00 via INTRAVENOUS

## 2016-04-28 MED ORDER — MIDAZOLAM HCL 2 MG/2ML IJ SOLN
INTRAMUSCULAR | Status: DC | PRN
  Administered 2016-04-28: 1 mg via INTRAVENOUS

## 2016-04-28 MED ORDER — EPHEDRINE SULFATE 50 MG/ML IJ SOLN
INTRAMUSCULAR | Status: DC | PRN
  Administered 2016-04-28: 10 mg via INTRAVENOUS

## 2016-04-28 MED ORDER — SODIUM CHLORIDE 0.9 % IV SOLN: INTRAVENOUS | Status: DC

## 2016-04-28 MED ORDER — FENTANYL CITRATE (PF) 100 MCG/2ML IJ SOLN
INTRAMUSCULAR | Status: DC | PRN
  Administered 2016-04-28: 50 ug via INTRAVENOUS

## 2016-04-28 MED ORDER — IPRATROPIUM-ALBUTEROL 0.5-2.5 (3) MG/3ML IN SOLN
RESPIRATORY_TRACT | Status: AC
  Administered 2016-04-28: 3 mL via RESPIRATORY_TRACT
  Filled 2016-04-28: qty 3

## 2016-04-28 NOTE — Op Note (Signed)
[facility] Patient Name: Sara Pierce Procedure Date: 04/28/2016 12:59 PM MRN: [id number] Account #: [id number] Date of Birth: September 27, 1941 Admit Type: Outpatient Age: 75 Room: [facility] ENDO ROOM 4 Gender: Female Note Status: Finalized Procedure:            Colonoscopy Indications:          High risk colon cancer surveillance: Personal history                        of colonic polyps Providers:            Manya Silvas, MD Referring MD:         Talbert Cage (Referring MD) Medicines:            Propofol per Anesthesia Complications:        No immediate complications. Procedure:            Pre-Anesthesia Assessment:                       - After reviewing the risks and benefits, the patient                        was deemed in satisfactory condition to undergo the                        procedure.                       After obtaining informed consent, the colonoscope was                        passed under direct vision. Throughout the procedure,                        the patient's blood pressure, pulse, and oxygen                        saturations were monitored continuously. The                        Colonoscope was introduced through the anus with the                        intention of advancing to the cecum. The scope was                        advanced to the transverse colon before the procedure                        was aborted. Medications were given. The quality of the                        bowel preparation was unsatisfactory. Findings:      A large amount of copious quantities of semi-liquid semi-solid stool was       found in the recto-sigmoid colon, in the descending colon and in the       transverse colon, precluding visualization. Onee small polyp seen but       not removed due to stool. Impression:           - Preparation of the colon was unsatisfactory.                       -  No specimens collected. Recommendation:        - The findings and recommendations were discussed with                        the patient's family. Due to inadequate prep will need                        to reschedule. Manya Silvas, MD 04/28/2016 1:22:37 PM This report has been signed electronically. Number of Addenda: 0 Note Initiated On: 04/28/2016 12:59 PM Total Procedure Duration: 0 hours 12 minutes 41 seconds       [facility]

## 2016-04-28 NOTE — Anesthesia Preprocedure Evaluation (Addendum)
Anesthesia Evaluation  Patient identified by MRN, date of birth, ID band Patient awake    Reviewed: Allergy & Precautions, NPO status , Patient's Chart, lab work & pertinent test results, reviewed documented beta blocker date and time   Airway Mallampati: II  TM Distance: >3 FB     Dental  (+) Chipped, Partial Lower, Partial Upper   Pulmonary sleep apnea and Continuous Positive Airway Pressure Ventilation , pneumonia, resolved, COPD, former smoker,           Cardiovascular hypertension, Pt. on home beta blockers and Pt. on medications + pacemaker      Neuro/Psych PSYCHIATRIC DISORDERS Anxiety Depression  Neuromuscular disease    GI/Hepatic   Endo/Other  diabetes  Renal/GU Renal InsufficiencyRenal disease     Musculoskeletal  (+) Arthritis ,   Abdominal   Peds  Hematology  (+) anemia ,   Anesthesia Other Findings Obese. Wheezing - will start nebulizeer with duoneb. Will use CPAP tonite.  Reproductive/Obstetrics                            Anesthesia Physical Anesthesia Plan  ASA: III  Anesthesia Plan: General   Post-op Pain Management:    Induction: Intravenous  Airway Management Planned: Nasal Cannula  Additional Equipment:   Intra-op Plan:   Post-operative Plan:   Informed Consent: I have reviewed the patients History and Physical, chart, labs and discussed the procedure including the risks, benefits and alternatives for the proposed anesthesia with the patient or authorized representative who has indicated his/her understanding and acceptance.     Plan Discussed with: CRNA  Anesthesia Plan Comments:         Anesthesia Quick Evaluation

## 2016-04-28 NOTE — Transfer of Care (Signed)
Immediate Anesthesia Transfer of Care Note  Patient: Sara Pierce  Procedure(s) Performed: Procedure(s): COLONOSCOPY WITH PROPOFOL (N/A)  Patient Location: PACU  Anesthesia Type:General  Level of Consciousness: awake and sedated  Airway & Oxygen Therapy: Patient Spontanous Breathing and Patient connected to nasal cannula oxygen  Post-op Assessment: Report given to RN, Post -op Vital signs reviewed and stable and Post -op Vital signs reviewed and unstable, Anesthesiologist notified  Post vital signs: Reviewed  Last Vitals:  Filed Vitals:   04/28/16 1117  BP: 139/30  Pulse: 57  Temp: 36.6 C  Resp: 18    Last Pain: There were no vitals filed for this visit.       Complications: No apparent anesthesia complications

## 2016-04-28 NOTE — Anesthesia Procedure Notes (Signed)
Performed by: COOK-MARTIN, Michala Deblanc Pre-anesthesia Checklist: Patient identified, Emergency Drugs available, Suction available, Patient being monitored and Timeout performed Patient Re-evaluated:Patient Re-evaluated prior to inductionOxygen Delivery Method: Nasal cannula Preoxygenation: Pre-oxygenation with 100% oxygen Intubation Type: IV induction Placement Confirmation: CO2 detector and positive ETCO2       

## 2016-04-28 NOTE — H&P (Signed)
Primary Care Physician:  Talbert Cage, FNP Primary Gastroenterologist:  Dr. Vira Agar  Pre-Procedure History & Physical: HPI:  Sara Pierce is a 75 y.o. female is here for an colonoscopy.   Past Medical History  Diagnosis Date  . COPD (chronic obstructive pulmonary disease) ([place])   . Clotting disorder ([place])   . Chronic kidney disease   . Depression   . Cardiomyopathy ([place])   . Arthritis   . Cataract   . Cerebral hemorrhage ([place])   . Diabetes mellitus without complication ([place])   . Hypertension   . Obesity   . Sleep apnea   . Urinary frequency   . Vaginal atrophy   . Varicose veins   . Mixed incontinence   . Thyroid disease     Past Surgical History  Procedure Laterality Date  . Appendectomy    . Cardiac catherization    . Pacemaker insertion    . Joint replacement    . Bilateral oophorectomy    . Spine surgery    . Strabismus surgery    . Tonsillectomy    . Tubal ligation      Prior to Admission medications   Medication Sig Start Date End Date Taking? Authorizing Provider  albuterol (PROVENTIL HFA;VENTOLIN HFA) 108 (90 BASE) MCG/ACT inhaler Inhale 2 puffs into the lungs every 6 (six) hours as needed for wheezing.    Yes Historical Provider, MD  buPROPion (WELLBUTRIN XL) 300 MG 24 hr tablet Take 300 mg by mouth daily.    Yes Historical Provider, MD  carvedilol (COREG) 6.25 MG tablet Take 6.25 mg by mouth 2 (two) times daily.    Yes Historical Provider, MD  Fluticasone-Salmeterol (ADVAIR) 500-50 MCG/DOSE AEPB Inhale 1 puff into the lungs 2 (two) times daily.   Yes Historical Provider, MD  furosemide (LASIX) 20 MG tablet Take 1 tablet (20 mg total) by mouth daily. 11/19/15  Yes Gladstone Lighter, MD  liothyronine (CYTOMEL) 25 MCG tablet Take 25 mcg by mouth daily.    Yes Historical Provider, MD  magnesium oxide (MAG-OX) 400 MG tablet Take 400 mg by mouth daily.    Yes Historical Provider, MD  potassium chloride (K-DUR) 10 MEQ tablet Take 10 mEq by mouth daily.     Yes Historical Provider, MD  primidone (MYSOLINE) 50 MG tablet Take 50 mg by mouth 2 (two) times daily.    Yes Historical Provider, MD  simvastatin (ZOCOR) 40 MG tablet Take 40 mg by mouth at bedtime.    Yes Historical Provider, MD  solifenacin (VESICARE) 10 MG tablet Take 10 mg by mouth daily.   Yes Historical Provider, MD  spironolactone (ALDACTONE) 25 MG tablet Take 1 tablet (25 mg total) by mouth daily. 11/19/15  Yes Gladstone Lighter, MD  tiotropium (SPIRIVA) 18 MCG inhalation capsule Place 18 mcg into inhaler and inhale daily.    Yes Historical Provider, MD  benzonatate (TESSALON) 200 MG capsule Take 200 mg by mouth 3 (three) times daily as needed for cough.    Historical Provider, MD  clonazePAM (KLONOPIN) 0.5 MG tablet Take 1 tablet (0.5 mg total) by mouth 3 (three) times daily as needed (Anxiety). 11/19/15   Gladstone Lighter, MD  clotrimazole (LOTRIMIN) 1 % cream Apply 1 application topically 2 (two) times daily.    Historical Provider, MD  clotrimazole-betamethasone (LOTRISONE) cream Apply 1 application topically 2 (two) times daily.    Historical Provider, MD  estradiol (ESTRACE) 0.1 MG/GM vaginal cream Place 1 Applicatorful vaginally 3 (three) times a week. Apply  a pea-sized amount with tip of finger on Monday, Wednesday, and Friday night    Historical Provider, MD  FLUoxetine (PROZAC) 40 MG capsule Take 40 mg by mouth daily.    Historical Provider, MD  guaiFENesin (MUCINEX) 600 MG 12 hr tablet Take 1 tablet (600 mg total) by mouth 2 (two) times daily. 11/19/15   Gladstone Lighter, MD  hydrocortisone cream 1 % Apply 1 application topically 2 (two) times daily as needed for itching.    Historical Provider, MD  ipratropium-albuterol (DUONEB) 0.5-2.5 (3) MG/3ML SOLN Inhale 3 mLs into the lungs 4 (four) times daily as needed (wheezing).     Historical Provider, MD  montelukast (SINGULAIR) 10 MG tablet Take 10 mg by mouth at bedtime.     Historical Provider, MD  nystatin (MYCOSTATIN/NYSTOP)  100000 UNIT/GM POWD Apply topically 2 (two) times daily as needed (rash). 1 application    Historical Provider, MD  oxyCODONE-acetaminophen (ROXICET) 5-325 MG per tablet Take 1 tablet by mouth every 6 (six) hours as needed. 06/05/15   Earleen Newport, MD  predniSONE (STERAPRED UNI-PAK 21 TAB) 10 MG (21) TBPK tablet Take 1 tablet (10 mg total) by mouth daily. 5 tabs PO x 1 day 4 tabs PO x 1 day 3 tabs PO x 1 day 2 tabs PO x 1 day 1 tab PO x 1 day and stop 11/19/15   Gladstone Lighter, MD  traZODone (DESYREL) 50 MG tablet Take 50 mg by mouth at bedtime as needed for sleep.    Historical Provider, MD  triamcinolone (KENALOG) 0.025 % cream Apply 1 application topically as needed (itchiness).    Historical Provider, MD  Vitamin D, Ergocalciferol, (DRISDOL) 50000 UNITS CAPS capsule Take 50,000 Units by mouth every 30 (thirty) days. On the first of each month    Historical Provider, MD    Allergies as of 04/27/2016 - Review Complete 02/07/2016  Allergen Reaction Noted  . Aspirin Swelling 03/15/2015  . Codeine Itching 03/15/2015  . Tape Hives 03/15/2015    Family History  Problem Relation Age of Onset  . Heart attack Father   . Coronary artery disease Father   . Hypertension Father   . Hyperlipidemia Father   . Heart attack Mother   . Hypertension Mother   . Hyperlipidemia Mother     Social History   Social History  . Marital Status: Married    Spouse Name: N/A  . Number of Children: N/A  . Years of Education: N/A   Occupational History  . Not on file.   Social History Main Topics  . Smoking status: Former Research scientist (life sciences)  . Smokeless tobacco: Not on file  . Alcohol Use: No     Comment: occasional  . Drug Use: Not on file  . Sexual Activity: Not on file   Other Topics Concern  . Not on file   Social History Narrative    Review of Systems: See HPI, otherwise negative ROS  Physical Exam: BP 139/30 mmHg  Pulse 57  Temp(Src) 97.8 F (36.6 C) (Tympanic)  Resp 18  Ht 5\' 5"   (1.651 m)  Wt 107.956 kg (238 lb)  BMI 39.61 kg/m2  SpO2 97% General:   Alert,  pleasant and cooperative in NAD Head:  Normocephalic and atraumatic. Neck:  Supple; no masses or thyromegaly. Lungs:  Clear throughout to auscultation.    Heart:  Regular rate and rhythm. Abdomen:  Soft, nontender and nondistended. Normal bowel sounds, without guarding, and without rebound.   Neurologic:  Alert and  oriented x4;  grossly normal neurologically.  Impression/Plan: Sara Pierce is here for an colonoscopy to be performed for [facility] colon polyps  Risks, benefits, limitations, and alternatives regarding  colonoscopy have been reviewed with the patient.  Questions have been answered.  All parties agreeable.   Gaylyn Cheers, MD  04/28/2016, 1:01 PM

## 2016-04-28 NOTE — OR Nursing (Signed)
Very poor prep, unable to complete prep.

## 2016-04-29 ENCOUNTER — Ambulatory Visit: Payer: Medicare (Managed Care) | Admitting: Anesthesiology

## 2016-04-29 ENCOUNTER — Ambulatory Visit
Admission: RE | Admit: 2016-04-29 | Discharge: 2016-04-29 | Disposition: A | Discharge status: Home / Self Care | Payer: Medicare (Managed Care) | Source: Ambulatory Visit | Attending: Unknown Physician Specialty | Admitting: Unknown Physician Specialty

## 2016-04-29 ENCOUNTER — Encounter: Payer: Self-pay | Admitting: *Deleted

## 2016-04-29 ENCOUNTER — Encounter
Admission: RE | Disposition: A | Discharge status: Home / Self Care | Payer: Self-pay | Source: Ambulatory Visit | Attending: Unknown Physician Specialty

## 2016-04-29 DIAGNOSIS — E119 Type 2 diabetes mellitus without complications: Secondary | ICD-10-CM | POA: Diagnosis not present

## 2016-04-29 DIAGNOSIS — F329 Major depressive disorder, single episode, unspecified: Secondary | ICD-10-CM | POA: Insufficient documentation

## 2016-04-29 DIAGNOSIS — E669 Obesity, unspecified: Secondary | ICD-10-CM | POA: Insufficient documentation

## 2016-04-29 DIAGNOSIS — Z950 Presence of cardiac pacemaker: Secondary | ICD-10-CM | POA: Insufficient documentation

## 2016-04-29 DIAGNOSIS — F419 Anxiety disorder, unspecified: Secondary | ICD-10-CM | POA: Insufficient documentation

## 2016-04-29 DIAGNOSIS — D120 Benign neoplasm of cecum: Secondary | ICD-10-CM | POA: Diagnosis not present

## 2016-04-29 DIAGNOSIS — D649 Anemia, unspecified: Secondary | ICD-10-CM | POA: Diagnosis not present

## 2016-04-29 DIAGNOSIS — G4730 Sleep apnea, unspecified: Secondary | ICD-10-CM | POA: Diagnosis not present

## 2016-04-29 DIAGNOSIS — Z87891 Personal history of nicotine dependence: Secondary | ICD-10-CM | POA: Diagnosis not present

## 2016-04-29 DIAGNOSIS — I129 Hypertensive chronic kidney disease with stage 1 through stage 4 chronic kidney disease, or unspecified chronic kidney disease: Secondary | ICD-10-CM | POA: Diagnosis not present

## 2016-04-29 DIAGNOSIS — Z79899 Other long term (current) drug therapy: Secondary | ICD-10-CM | POA: Insufficient documentation

## 2016-04-29 DIAGNOSIS — D123 Benign neoplasm of transverse colon: Secondary | ICD-10-CM | POA: Insufficient documentation

## 2016-04-29 DIAGNOSIS — Z1211 Encounter for screening for malignant neoplasm of colon: Secondary | ICD-10-CM | POA: Diagnosis not present

## 2016-04-29 DIAGNOSIS — M1990 Unspecified osteoarthritis, unspecified site: Secondary | ICD-10-CM | POA: Diagnosis not present

## 2016-04-29 DIAGNOSIS — Z86010 Personal history of colonic polyps: Secondary | ICD-10-CM | POA: Diagnosis present

## 2016-04-29 DIAGNOSIS — N189 Chronic kidney disease, unspecified: Secondary | ICD-10-CM | POA: Diagnosis not present

## 2016-04-29 DIAGNOSIS — Z9660 Presence of unspecified orthopedic joint implant: Secondary | ICD-10-CM | POA: Diagnosis not present

## 2016-04-29 DIAGNOSIS — J449 Chronic obstructive pulmonary disease, unspecified: Secondary | ICD-10-CM | POA: Insufficient documentation

## 2016-04-29 DIAGNOSIS — K648 Other hemorrhoids: Secondary | ICD-10-CM | POA: Insufficient documentation

## 2016-04-29 DIAGNOSIS — I429 Cardiomyopathy, unspecified: Secondary | ICD-10-CM | POA: Diagnosis not present

## 2016-04-29 HISTORY — PX: COLONOSCOPY WITH PROPOFOL: SHX5780

## 2016-04-29 SURGERY — COLONOSCOPY WITH PROPOFOL
Anesthesia: General

## 2016-04-29 MED ORDER — MIDAZOLAM HCL 5 MG/5ML IJ SOLN
INTRAMUSCULAR | Status: DC | PRN
  Administered 2016-04-29: 1 mg via INTRAVENOUS

## 2016-04-29 MED ORDER — SODIUM CHLORIDE 0.9 % IV SOLN: INTRAVENOUS | Status: DC

## 2016-04-29 MED ORDER — FENTANYL CITRATE (PF) 100 MCG/2ML IJ SOLN
INTRAMUSCULAR | Status: DC | PRN
  Administered 2016-04-29: 50 ug via INTRAVENOUS

## 2016-04-29 MED ORDER — PROPOFOL 500 MG/50ML IV EMUL
INTRAVENOUS | Status: DC | PRN
  Administered 2016-04-29: 50 ug/kg/min via INTRAVENOUS

## 2016-04-29 MED ORDER — SODIUM CHLORIDE 0.9 % IV SOLN
INTRAVENOUS | Status: DC
  Administered 2016-04-29: 08:00:00 via INTRAVENOUS

## 2016-04-29 MED ORDER — LIDOCAINE HCL (PF) 2 % IJ SOLN
INTRAMUSCULAR | Status: DC | PRN
  Administered 2016-04-29: 60 mg

## 2016-04-29 MED ORDER — PROPOFOL 10 MG/ML IV BOLUS
INTRAVENOUS | Status: DC | PRN
  Administered 2016-04-29: 30 mg via INTRAVENOUS

## 2016-04-29 MED ORDER — EPHEDRINE SULFATE 50 MG/ML IJ SOLN
INTRAMUSCULAR | Status: DC | PRN
  Administered 2016-04-29: 15 mg via INTRAVENOUS
  Administered 2016-04-29 (×3): 10 mg via INTRAVENOUS
  Administered 2016-04-29 (×2): 15 mg via INTRAVENOUS

## 2016-04-29 NOTE — Anesthesia Postprocedure Evaluation (Signed)
Anesthesia Post Note  Patient: Sara Pierce  Procedure(s) Performed: Procedure(s) (LRB): COLONOSCOPY WITH PROPOFOL (N/A)  Patient location during evaluation: Endoscopy Anesthesia Type: General Level of consciousness: awake and alert Pain management: pain level controlled Vital Signs Assessment: post-procedure vital signs reviewed and stable Respiratory status: spontaneous breathing, nonlabored ventilation, respiratory function stable and patient connected to nasal cannula oxygen Cardiovascular status: blood pressure returned to baseline and stable Postop Assessment: no signs of nausea or vomiting Anesthetic complications: no    Last Vitals:  Filed Vitals:   04/29/16 0930 04/29/16 0940  BP: 119/50 114/40  Pulse:    Temp:    Resp:      Last Pain: There were no vitals filed for this visit.               Precious Haws Opha Mcghee

## 2016-04-29 NOTE — Op Note (Signed)
[facility] Patient Name: Sara Pierce Procedure Date: 04/29/2016 8:03 AM MRN: [id number] Account #: [id number] Date of Birth: 12-31-1940 Admit Type: Outpatient Age: 75 Room: [facility] ENDO ROOM 1 Gender: Female Note Status: Finalized Procedure:            Colonoscopy Indications:          High risk colon cancer surveillance: Personal history                        of colonic polyps Providers:            Manya Silvas, MD Medicines:            Propofol per Anesthesia Complications:        No immediate complications. Procedure:            Pre-Anesthesia Assessment:                       - After reviewing the risks and benefits, the patient                        was deemed in satisfactory condition to undergo the                        procedure.                       After obtaining informed consent, the colonoscope was                        passed under direct vision. Throughout the procedure,                        the patient's blood pressure, pulse, and oxygen                        saturations were monitored continuously. The                        Colonoscope was introduced through the anus and                        advanced to the the cecum, identified by appendiceal                        orifice and ileocecal valve. The colonoscopy was                        technically difficult and complex due to significant                        looping. The patient tolerated the procedure well. The                        quality of the bowel preparation was adequate to                        identify polyps. Findings:      Four small polyps was found in the cecum. The polyp was sessile. The       polyp was removed with a hot snare. Resection and retrieval were  complete.      A large polyp was found in the ascending colon. The polyp was sessile.       The polyp was removed with a hot snare. Resection and retrieval were       complete. this  base was treated with Argon and 5 clips to bring the       edges together. Polyp tissue recovered in a basket.      2 polyps in hepatic flexure were removed. clips applied.      Large polyp removed from transverse colon and closed with 4 clips.      Stool was present in the area of the cecum and this caused me not to       clip and close the edges. Impression:           - One large polyp in the cecum, removed with a hot                        snare. Resected and retrieved.                       - One large polyp in the ascending colon, removed with                        a hot snare. Resected and retrieved. Recommendation:       - Await pathology results. Repeat in 6 months, avoid                        medication that might make you bleed, Manya Silvas, MD 04/29/2016 9:16:05 AM This report has been signed electronically. Number of Addenda: 0 Note Initiated On: 04/29/2016 8:03 AM Scope Withdrawal Time: 0 hours 44 minutes 13 seconds  Total Procedure Duration: 0 hours 57 minutes 56 seconds       [facility]

## 2016-04-29 NOTE — H&P (Signed)
Primary Care Physician:  Talbert Cage, FNP Primary Gastroenterologist:  Dr. Vira Agar  Pre-Procedure History & Physical: HPI:  SUNG GAVAGHAN is a 75 y.o. female is here for an colonoscopy.   Past Medical History  Diagnosis Date  . COPD (chronic obstructive pulmonary disease) ([place])   . Clotting disorder ([place])   . Chronic kidney disease   . Depression   . Cardiomyopathy ([place])   . Arthritis   . Cataract   . Cerebral hemorrhage ([place])   . Diabetes mellitus without complication ([place])   . Hypertension   . Obesity   . Sleep apnea   . Urinary frequency   . Vaginal atrophy   . Varicose veins   . Mixed incontinence   . Thyroid disease     Past Surgical History  Procedure Laterality Date  . Appendectomy    . Cardiac catherization    . Pacemaker insertion    . Joint replacement    . Bilateral oophorectomy    . Spine surgery    . Strabismus surgery    . Tonsillectomy    . Tubal ligation      Prior to Admission medications   Medication Sig Start Date End Date Taking? Authorizing Provider  albuterol (PROVENTIL HFA;VENTOLIN HFA) 108 (90 BASE) MCG/ACT inhaler Inhale 2 puffs into the lungs every 6 (six) hours as needed for wheezing.     Historical Provider, MD  benzonatate (TESSALON) 200 MG capsule Take 200 mg by mouth 3 (three) times daily as needed for cough.    Historical Provider, MD  buPROPion (WELLBUTRIN XL) 300 MG 24 hr tablet Take 300 mg by mouth daily.     Historical Provider, MD  carvedilol (COREG) 6.25 MG tablet Take 6.25 mg by mouth 2 (two) times daily.     Historical Provider, MD  clonazePAM (KLONOPIN) 0.5 MG tablet Take 1 tablet (0.5 mg total) by mouth 3 (three) times daily as needed (Anxiety). 11/19/15   Gladstone Lighter, MD  clotrimazole (LOTRIMIN) 1 % cream Apply 1 application topically 2 (two) times daily.    Historical Provider, MD  clotrimazole-betamethasone (LOTRISONE) cream Apply 1 application topically 2 (two) times daily.    Historical Provider, MD   estradiol (ESTRACE) 0.1 MG/GM vaginal cream Place 1 Applicatorful vaginally 3 (three) times a week. Apply a pea-sized amount with tip of finger on Monday, Wednesday, and Friday night    Historical Provider, MD  FLUoxetine (PROZAC) 40 MG capsule Take 40 mg by mouth daily.    Historical Provider, MD  Fluticasone-Salmeterol (ADVAIR) 500-50 MCG/DOSE AEPB Inhale 1 puff into the lungs 2 (two) times daily.    Historical Provider, MD  furosemide (LASIX) 20 MG tablet Take 1 tablet (20 mg total) by mouth daily. 11/19/15   Gladstone Lighter, MD  guaiFENesin (MUCINEX) 600 MG 12 hr tablet Take 1 tablet (600 mg total) by mouth 2 (two) times daily. 11/19/15   Gladstone Lighter, MD  hydrocortisone cream 1 % Apply 1 application topically 2 (two) times daily as needed for itching.    Historical Provider, MD  ipratropium-albuterol (DUONEB) 0.5-2.5 (3) MG/3ML SOLN Inhale 3 mLs into the lungs 4 (four) times daily as needed (wheezing).     Historical Provider, MD  liothyronine (CYTOMEL) 25 MCG tablet Take 25 mcg by mouth daily.     Historical Provider, MD  magnesium oxide (MAG-OX) 400 MG tablet Take 400 mg by mouth daily.     Historical Provider, MD  montelukast (SINGULAIR) 10 MG tablet Take 10 mg by  mouth at bedtime.     Historical Provider, MD  nystatin (MYCOSTATIN/NYSTOP) 100000 UNIT/GM POWD Apply topically 2 (two) times daily as needed (rash). 1 application    Historical Provider, MD  oxyCODONE-acetaminophen (ROXICET) 5-325 MG per tablet Take 1 tablet by mouth every 6 (six) hours as needed. 06/05/15   Earleen Newport, MD  potassium chloride (K-DUR) 10 MEQ tablet Take 10 mEq by mouth daily.     Historical Provider, MD  predniSONE (STERAPRED UNI-PAK 21 TAB) 10 MG (21) TBPK tablet Take 1 tablet (10 mg total) by mouth daily. 5 tabs PO x 1 day 4 tabs PO x 1 day 3 tabs PO x 1 day 2 tabs PO x 1 day 1 tab PO x 1 day and stop 11/19/15   Gladstone Lighter, MD  primidone (MYSOLINE) 50 MG tablet Take 50 mg by mouth 2 (two)  times daily.     Historical Provider, MD  simvastatin (ZOCOR) 40 MG tablet Take 40 mg by mouth at bedtime.     Historical Provider, MD  solifenacin (VESICARE) 10 MG tablet Take 10 mg by mouth daily.    Historical Provider, MD  spironolactone (ALDACTONE) 25 MG tablet Take 1 tablet (25 mg total) by mouth daily. 11/19/15   Gladstone Lighter, MD  tiotropium (SPIRIVA) 18 MCG inhalation capsule Place 18 mcg into inhaler and inhale daily.     Historical Provider, MD  traZODone (DESYREL) 50 MG tablet Take 50 mg by mouth at bedtime as needed for sleep.    Historical Provider, MD  triamcinolone (KENALOG) 0.025 % cream Apply 1 application topically as needed (itchiness).    Historical Provider, MD  Vitamin D, Ergocalciferol, (DRISDOL) 50000 UNITS CAPS capsule Take 50,000 Units by mouth every 30 (thirty) days. On the first of each month    Historical Provider, MD    Allergies as of 04/28/2016 - Review Complete 04/28/2016  Allergen Reaction Noted  . Aspirin Swelling 03/15/2015  . Codeine Itching 03/15/2015  . Naproxen  04/28/2016  . Tape Hives 03/15/2015    Family History  Problem Relation Age of Onset  . Heart attack Father   . Coronary artery disease Father   . Hypertension Father   . Hyperlipidemia Father   . Heart attack Mother   . Hypertension Mother   . Hyperlipidemia Mother     Social History   Social History  . Marital Status: Married    Spouse Name: N/A  . Number of Children: N/A  . Years of Education: N/A   Occupational History  . Not on file.   Social History Main Topics  . Smoking status: Former Smoker -- 0.50 packs/day for 44 years    Types: Cigarettes  . Smokeless tobacco: Never Used  . Alcohol Use: No     Comment: occasional  . Drug Use: No  . Sexual Activity: Not on file   Other Topics Concern  . Not on file   Social History Narrative    Review of Systems: See HPI, otherwise negative ROS  Physical Exam: BP 105/71 mmHg  Pulse 68  Temp(Src) 96.5 F (35.8  C) (Tympanic)  Resp 20  Ht 5\' 5"  (1.651 m)  Wt 107.956 kg (238 lb)  BMI 39.61 kg/m2  SpO2 97% General:   Alert,  pleasant and cooperative in NAD Head:  Normocephalic and atraumatic. Neck:  Supple; no masses or thyromegaly. Lungs:  Clear throughout to auscultation.    Heart:  Regular rate and rhythm. Abdomen:  Soft, nontender and nondistended. Normal bowel  sounds, without guarding, and without rebound.  Obese Neurologic:  Alert and  oriented x4;  grossly normal neurologically.  Impression/Plan: CEAIRA TAMBURRO is here for an colonoscopy to be performed for [facility] colon polyps  Risks, benefits, limitations, and alternatives regarding  colonoscopy have been reviewed with the patient.  Questions have been answered.  All parties agreeable.   Gaylyn Cheers, MD  04/29/2016, 8:01 AM

## 2016-04-29 NOTE — Transfer of Care (Signed)
Immediate Anesthesia Transfer of Care Note  Patient: Sara Pierce  Procedure(s) Performed: Procedure(s): COLONOSCOPY WITH PROPOFOL (N/A)  Patient Location: PACU  Anesthesia Type:General  Level of Consciousness: sedated  Airway & Oxygen Therapy: Patient Spontanous Breathing and Patient connected to nasal cannula oxygen  Post-op Assessment: Report given to RN and Post -op Vital signs reviewed and stable  Post vital signs: Reviewed and stable  Last Vitals:  Filed Vitals:   04/29/16 0733 04/29/16 0913  BP: 105/71 96/40  Pulse: 68 61  Temp: 35.8 C 35.7 C  Resp: 20 15    Last Pain: There were no vitals filed for this visit.       Complications: No apparent anesthesia complications

## 2016-04-29 NOTE — Anesthesia Postprocedure Evaluation (Signed)
Anesthesia Post Note  Patient: Sara Pierce  Procedure(s) Performed: Procedure(s) (LRB): COLONOSCOPY WITH PROPOFOL (N/A)  Patient location during evaluation: Endoscopy Anesthesia Type: General Level of consciousness: awake and alert Pain management: pain level controlled Vital Signs Assessment: post-procedure vital signs reviewed and stable Respiratory status: spontaneous breathing, nonlabored ventilation, respiratory function stable and patient connected to nasal cannula oxygen Cardiovascular status: blood pressure returned to baseline and stable Postop Assessment: no signs of nausea or vomiting Anesthetic complications: no    Last Vitals:  Filed Vitals:   04/28/16 1336 04/28/16 1346  BP: 92/40 148/100  Pulse: 59 80  Temp:    Resp: 18 16    Last Pain: There were no vitals filed for this visit.               Martha Clan

## 2016-04-29 NOTE — Anesthesia Preprocedure Evaluation (Signed)
Anesthesia Evaluation  Patient identified by MRN, date of birth, ID band Patient awake    Reviewed: Allergy & Precautions, H&P , NPO status , Patient's Chart, lab work & pertinent test results, reviewed documented beta blocker date and time   History of Anesthesia Complications Negative for: history of anesthetic complications  Airway Mallampati: III  TM Distance: >3 FB Neck ROM: limited    Dental  (+) Chipped, Partial Lower, Partial Upper, Missing   Pulmonary sleep apnea and Continuous Positive Airway Pressure Ventilation , pneumonia, resolved, COPD, former smoker,           Cardiovascular Exercise Tolerance: Poor hypertension, Pt. on home beta blockers and Pt. on medications + pacemaker      Neuro/Psych PSYCHIATRIC DISORDERS Anxiety Depression  Neuromuscular disease    GI/Hepatic   Endo/Other  diabetes  Renal/GU Renal InsufficiencyRenal disease     Musculoskeletal  (+) Arthritis ,   Abdominal   Peds  Hematology  (+) anemia ,   Anesthesia Other Findings Past Medical History:   COPD (chronic obstructive pulmonary disease) (*              Clotting disorder (HCC)                                      Chronic kidney disease                                       Depression                                                   Cardiomyopathy ([place])                                         Arthritis                                                    Cataract                                                     Cerebral hemorrhage ([place])                                    Diabetes mellitus without complication ([place])                 Hypertension                                                 Obesity  Sleep apnea                                                  Urinary frequency                                            Vaginal atrophy                                               Varicose veins                                               Mixed incontinence                                           Thyroid disease                                             Past Surgical History:   APPENDECTOMY                                                  Cardiac Catherization                                         PACEMAKER INSERTION                                           JOINT REPLACEMENT                                             BILATERAL OOPHORECTOMY                                        SPINE SURGERY                                                 STRABISMUS SURGERY  TONSILLECTOMY                                                 TUBAL LIGATION                                               BMI    Body Mass Index   39.60 kg/m 2      Reproductive/Obstetrics                             Anesthesia Physical  Anesthesia Plan  ASA: III  Anesthesia Plan: General   Post-op Pain Management:    Induction: Intravenous  Airway Management Planned: Nasal Cannula  Additional Equipment:   Intra-op Plan:   Post-operative Plan:   Informed Consent: I have reviewed the patients History and Physical, chart, labs and discussed the procedure including the risks, benefits and alternatives for the proposed anesthesia with the patient or authorized representative who has indicated his/her understanding and acceptance.     Plan Discussed with: CRNA  Anesthesia Plan Comments:         Anesthesia Quick Evaluation

## 2016-04-30 LAB — SURGICAL PATHOLOGY

## 2016-05-04 ENCOUNTER — Encounter: Payer: Self-pay | Admitting: Unknown Physician Specialty

## 2016-05-05 IMAGING — CR DG CHEST 2V
1 series · 2 of 2 positions shown · non-contrast
Comparison: Chest radiograph May 20, 2014

CLINICAL DATA: Bloody sputum for 4 days.  Cough.

EXAM:
CHEST  2 VIEW

[Series 1: dxr chest pa (or ap) and lateral · 0.14mm/px · 2 of 2 slices shown]
[im 1/2]
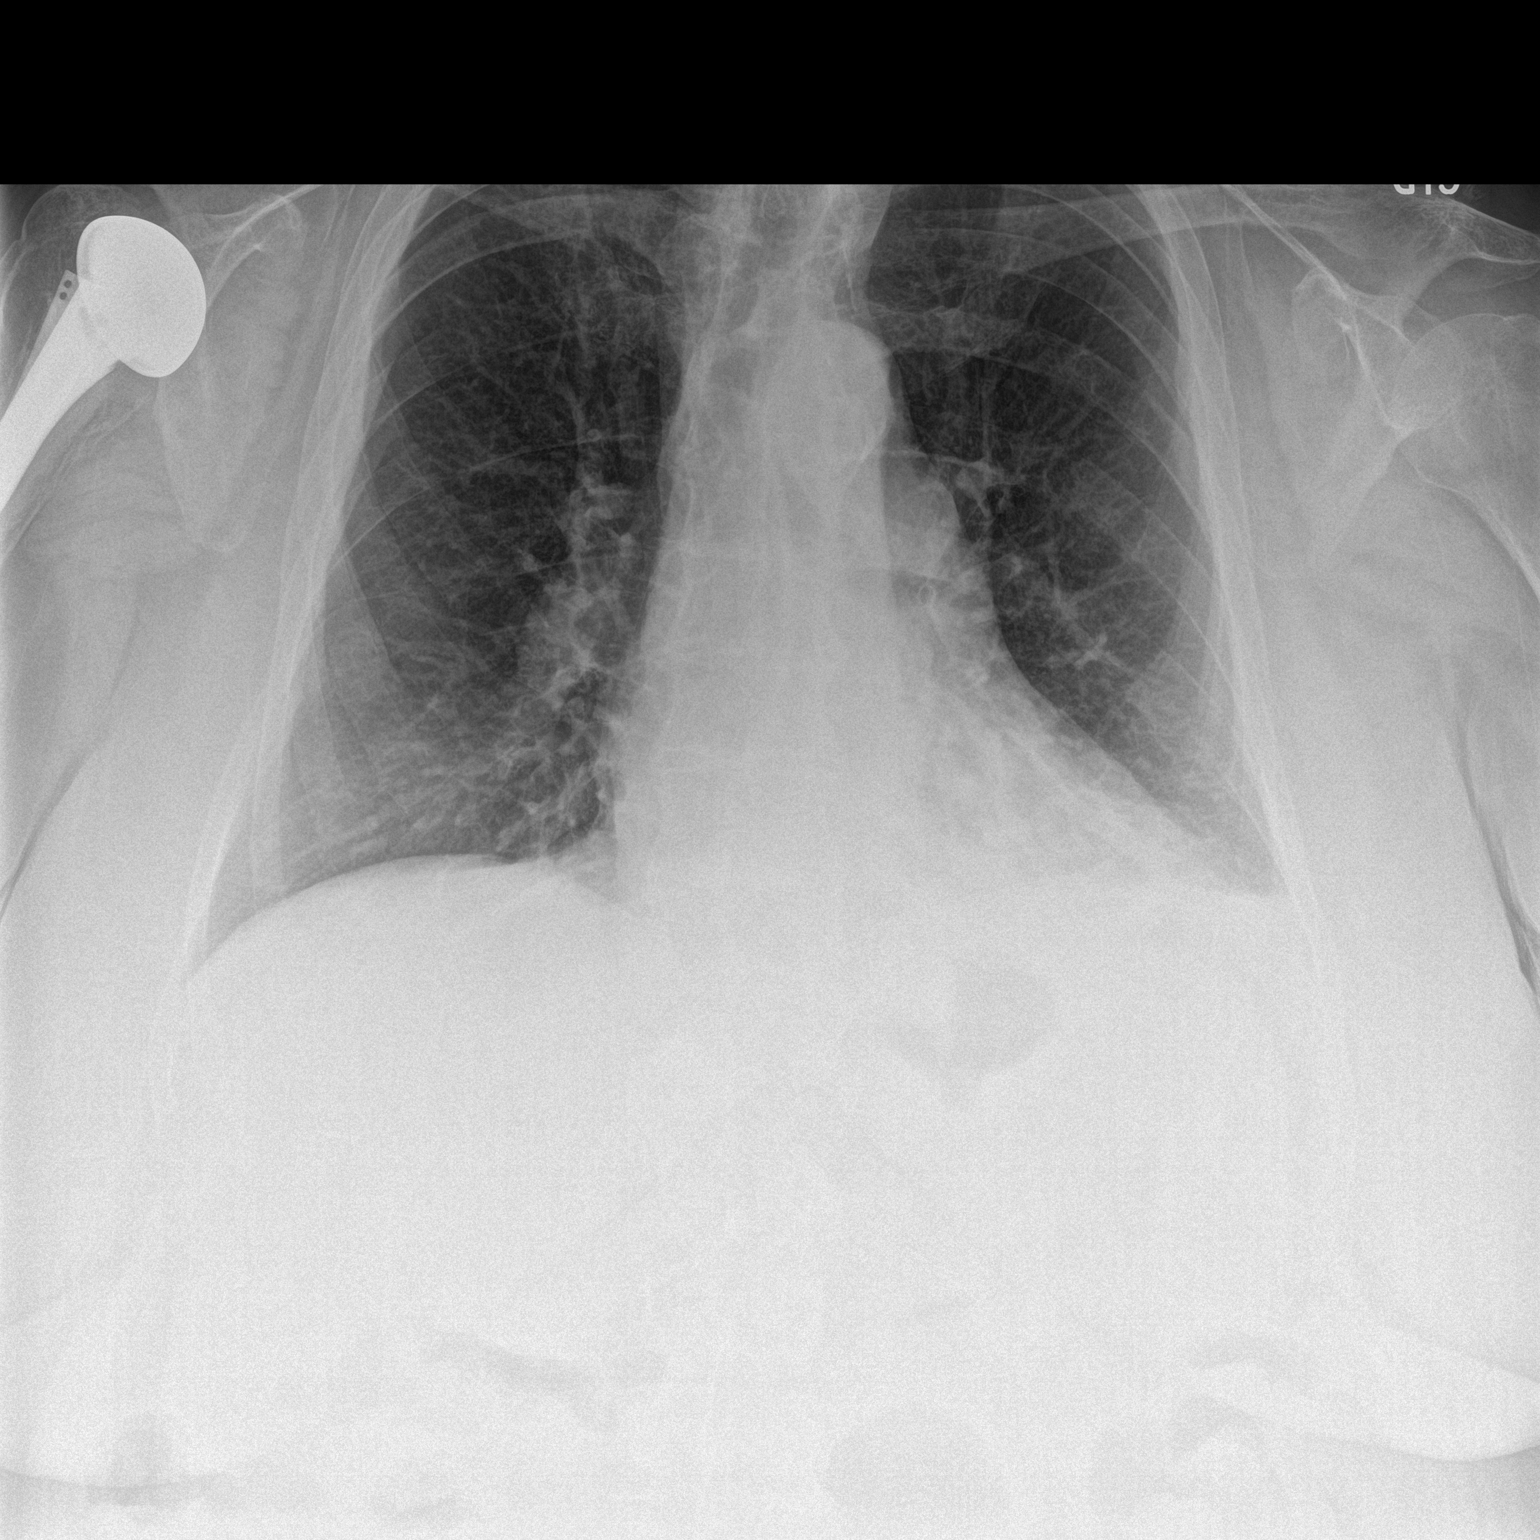
[im 2/2]
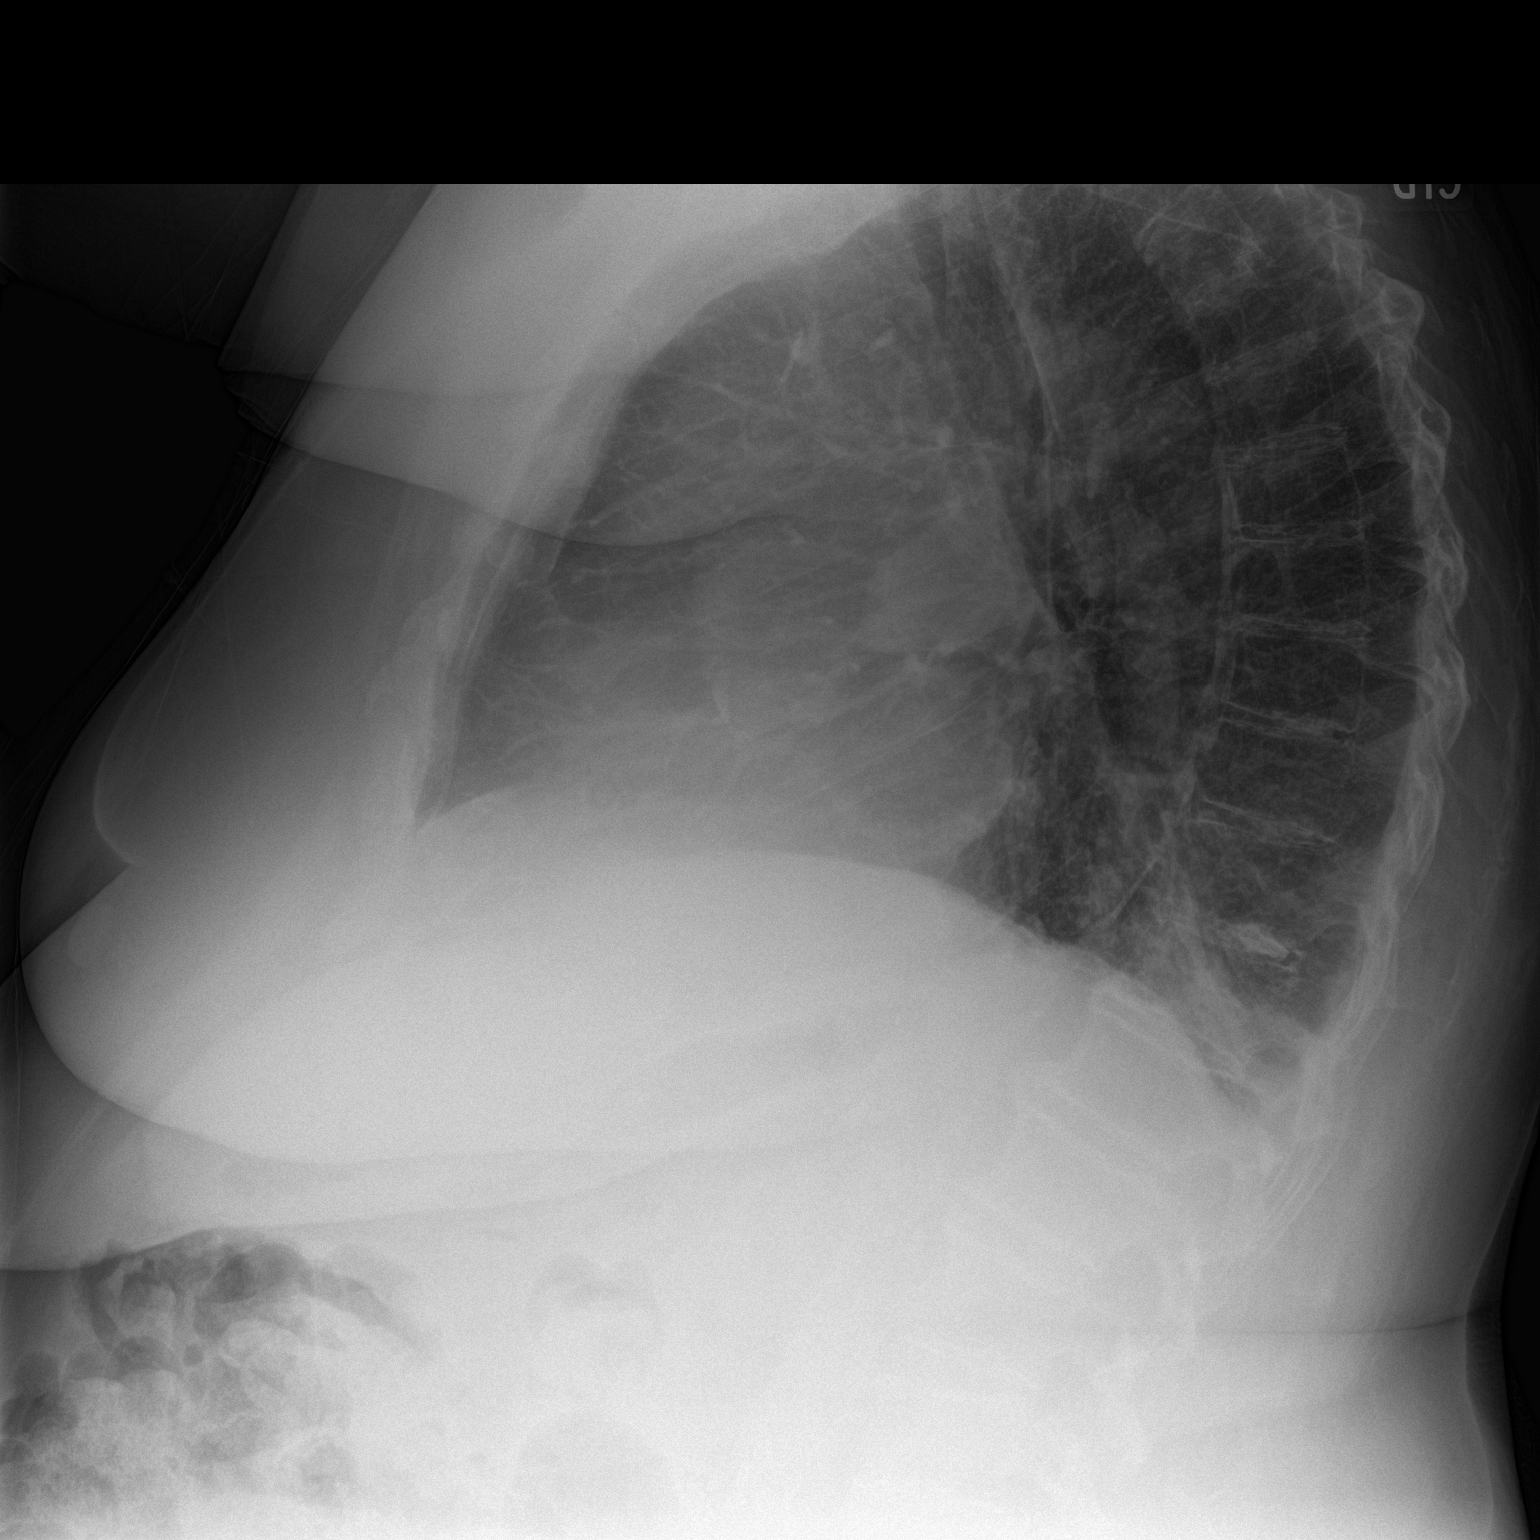

[2 of 2 positions shown; findings below may reference images not displayed]

FINDINGS: The cardiac silhouette remains mildly enlarged, mediastinal
silhouette is nonsuspicious, mildly calcified aortic knob. Similar
chronic interstitial changes with strandy densities in LEFT lung
base. Slight blunting of LEFT costophrenic angle is unchanged likely
reflecting pleural thickening. No pneumothorax.

Osteopenia. RIGHT humeral arthroplasty. Large body habitus. Moderate
degenerative change of thoracic spine.
IMPRESSION: Stable cardiomegaly. Similar LEFT lung base atelectasis versus
scarring in a background of probable COPD.

  By: Ayako Sawhney

## 2016-05-11 ENCOUNTER — Ambulatory Visit: Payer: Self-pay | Admitting: Family Medicine

## 2016-05-14 IMAGING — CR DG CHEST 2V
1 series · 2 of 2 positions shown · non-contrast
Comparison: 09/26/2014

CLINICAL DATA: Deep productive cough.  Chronic cough due to COPD.

EXAM:
CHEST  2 VIEW

[Series 1: dxr chest pa (or ap) and lateral · 0.14mm/px · 2 of 2 slices shown]
[im 1/2]
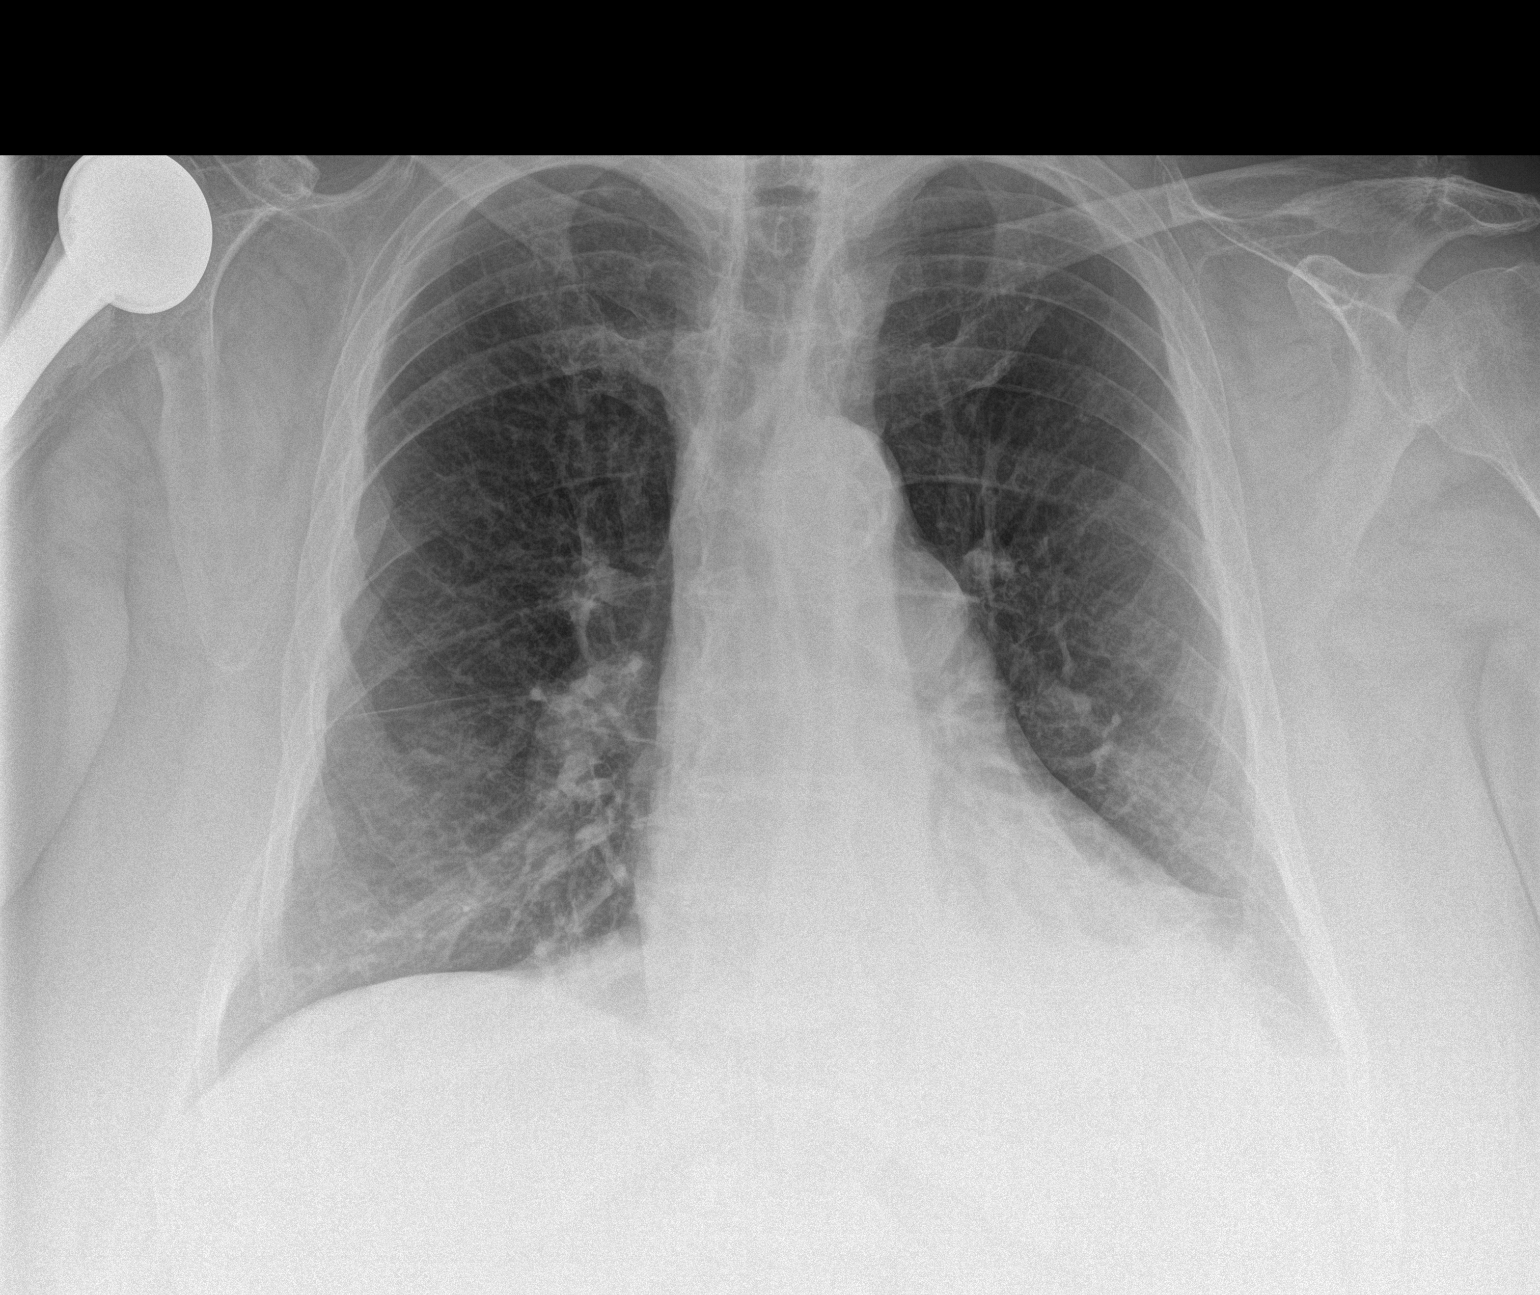
[im 2/2]
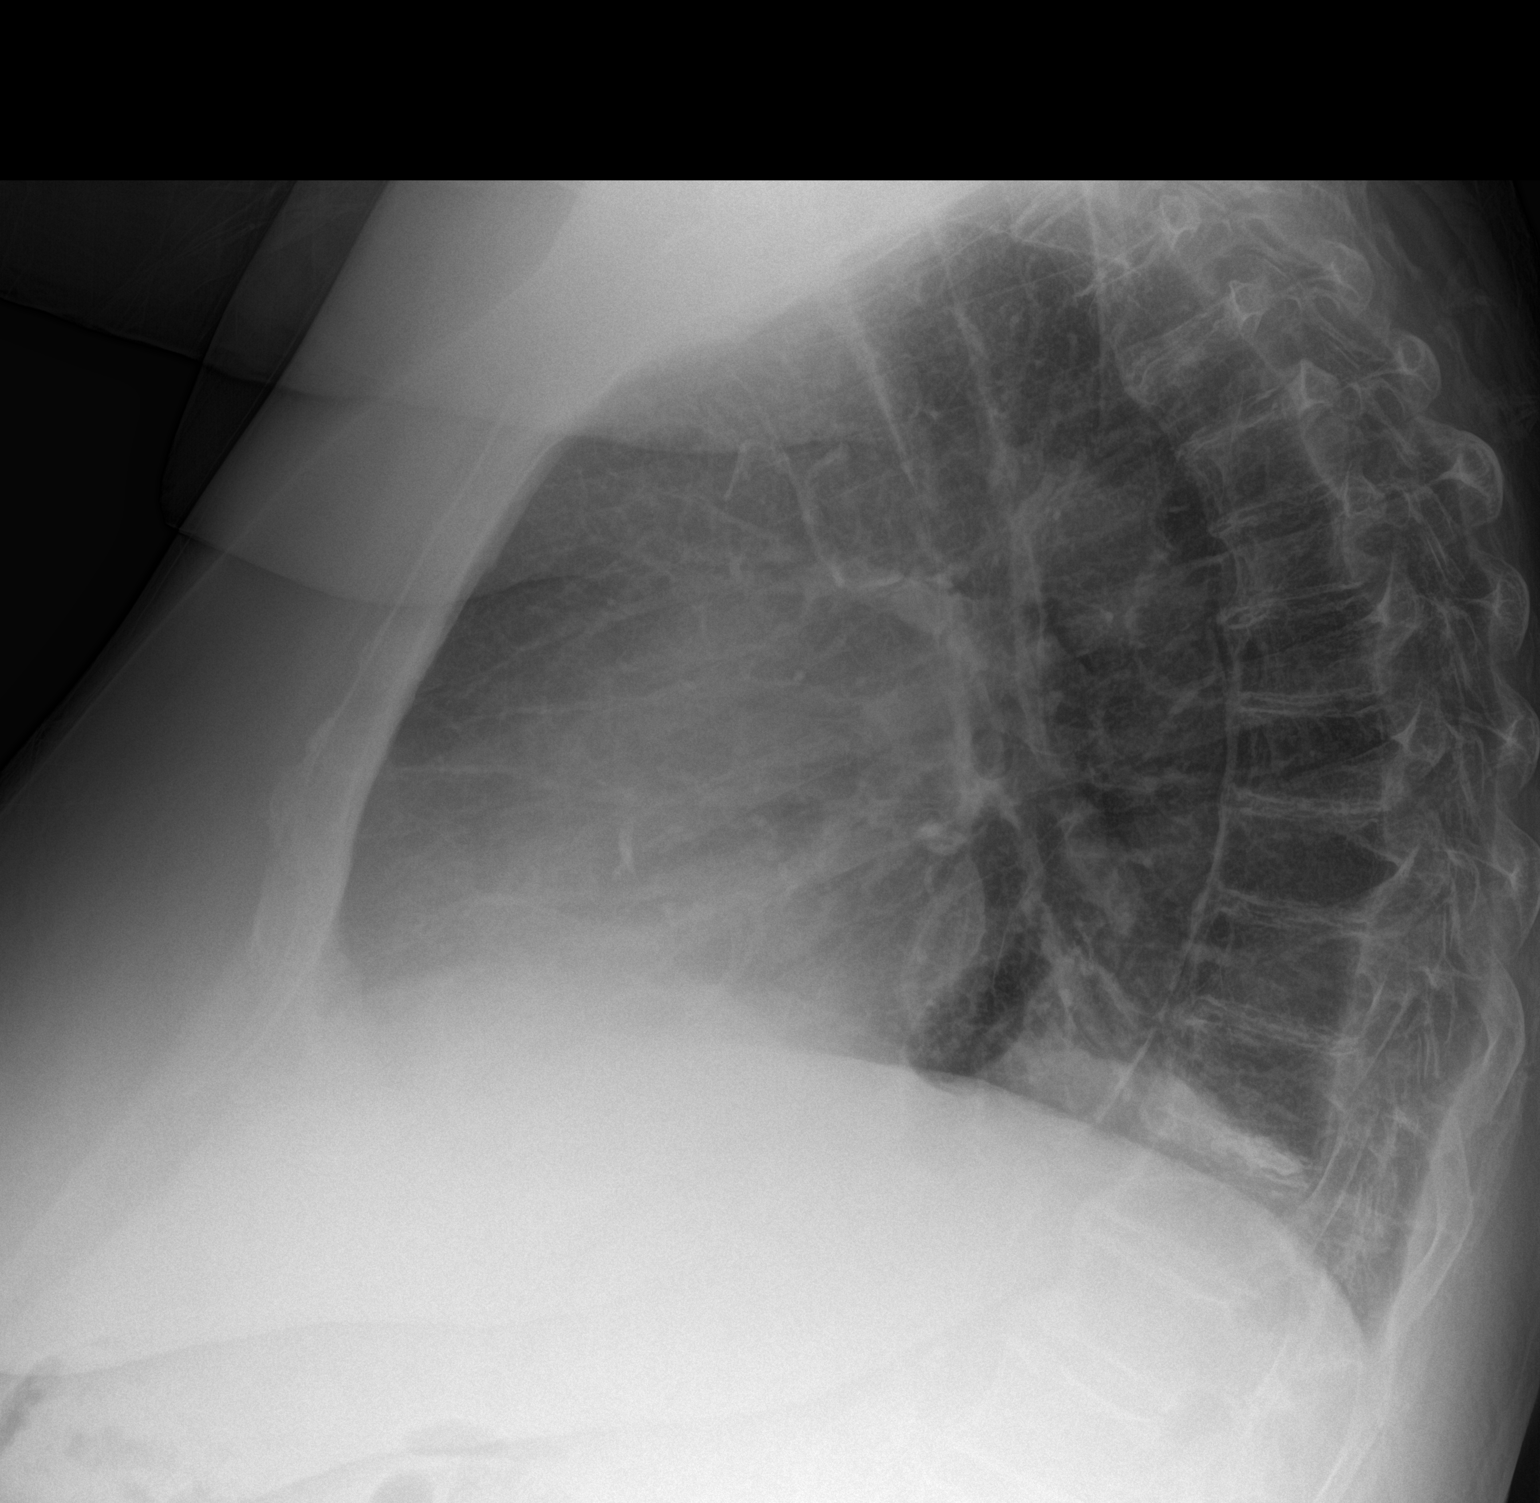

[2 of 2 positions shown; findings below may reference images not displayed]

FINDINGS: Left lower lobe airspace opacity has increased since prior study and
could reflect worsening atelectasis or pneumonia. No confluent
opacity on the right. Mild hyperinflation/COPD. Heart is normal
size. No effusions or acute bony abnormality.
IMPRESSION: COPD.

Worsening left lower lobe atelectasis or pneumonia.

## 2016-05-14 IMAGING — CR DG HIP COMPLETE 2+V*L*
1 series · 3 of 3 positions shown · non-contrast
Comparison: None.

CLINICAL DATA: Fall 3 weeks ago on left side, pain

EXAM:
LEFT HIP - COMPLETE 2+ VIEW

[Series 1: dxr hip left complete · 0.14mm/px · 3 of 3 slices shown]
[im 1/3]
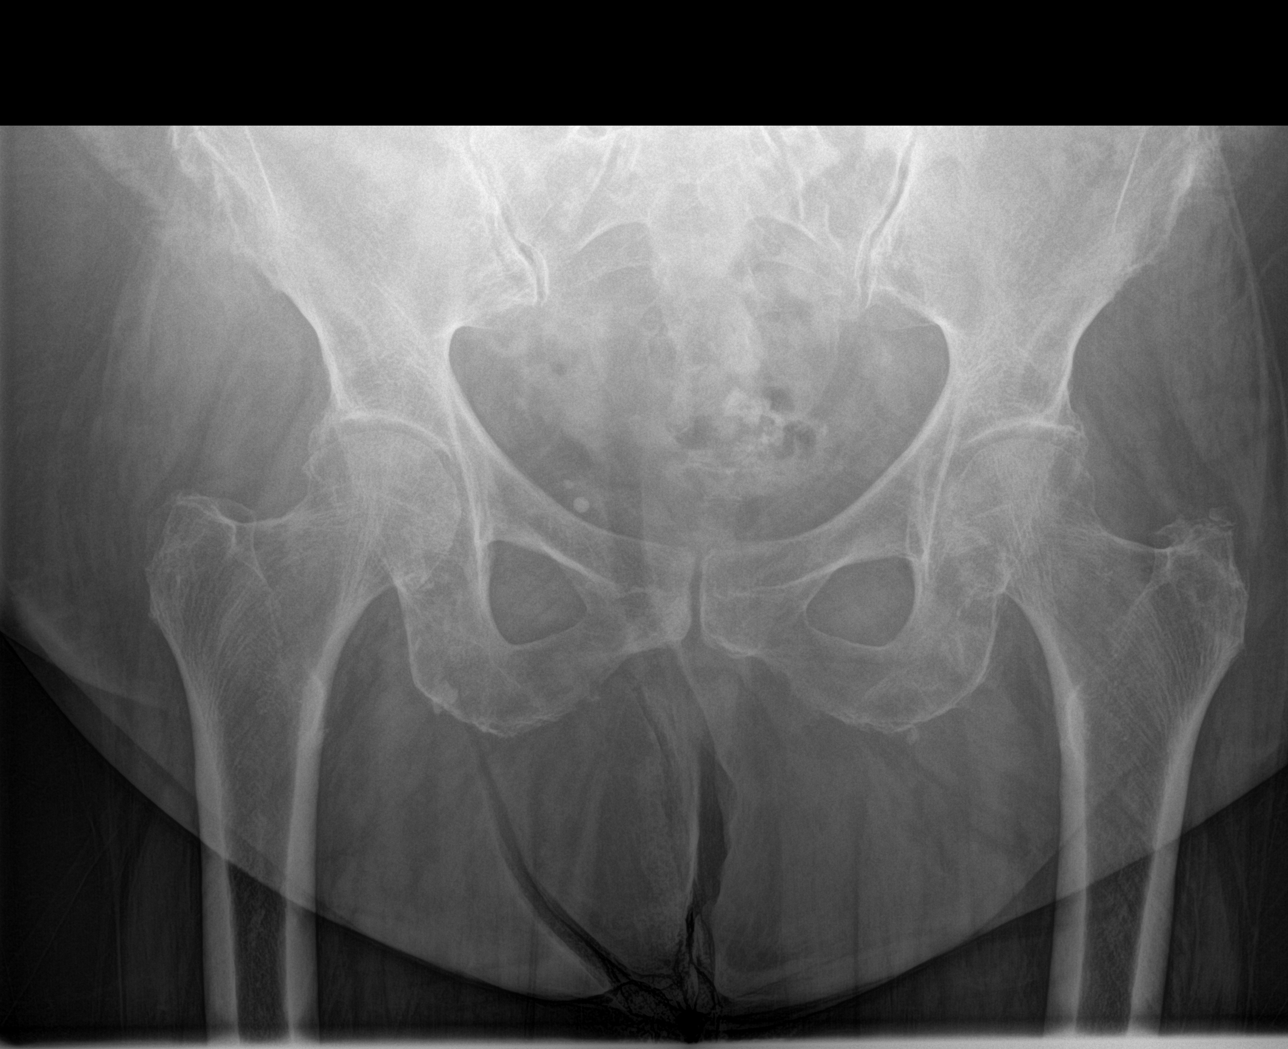
[im 2/3]
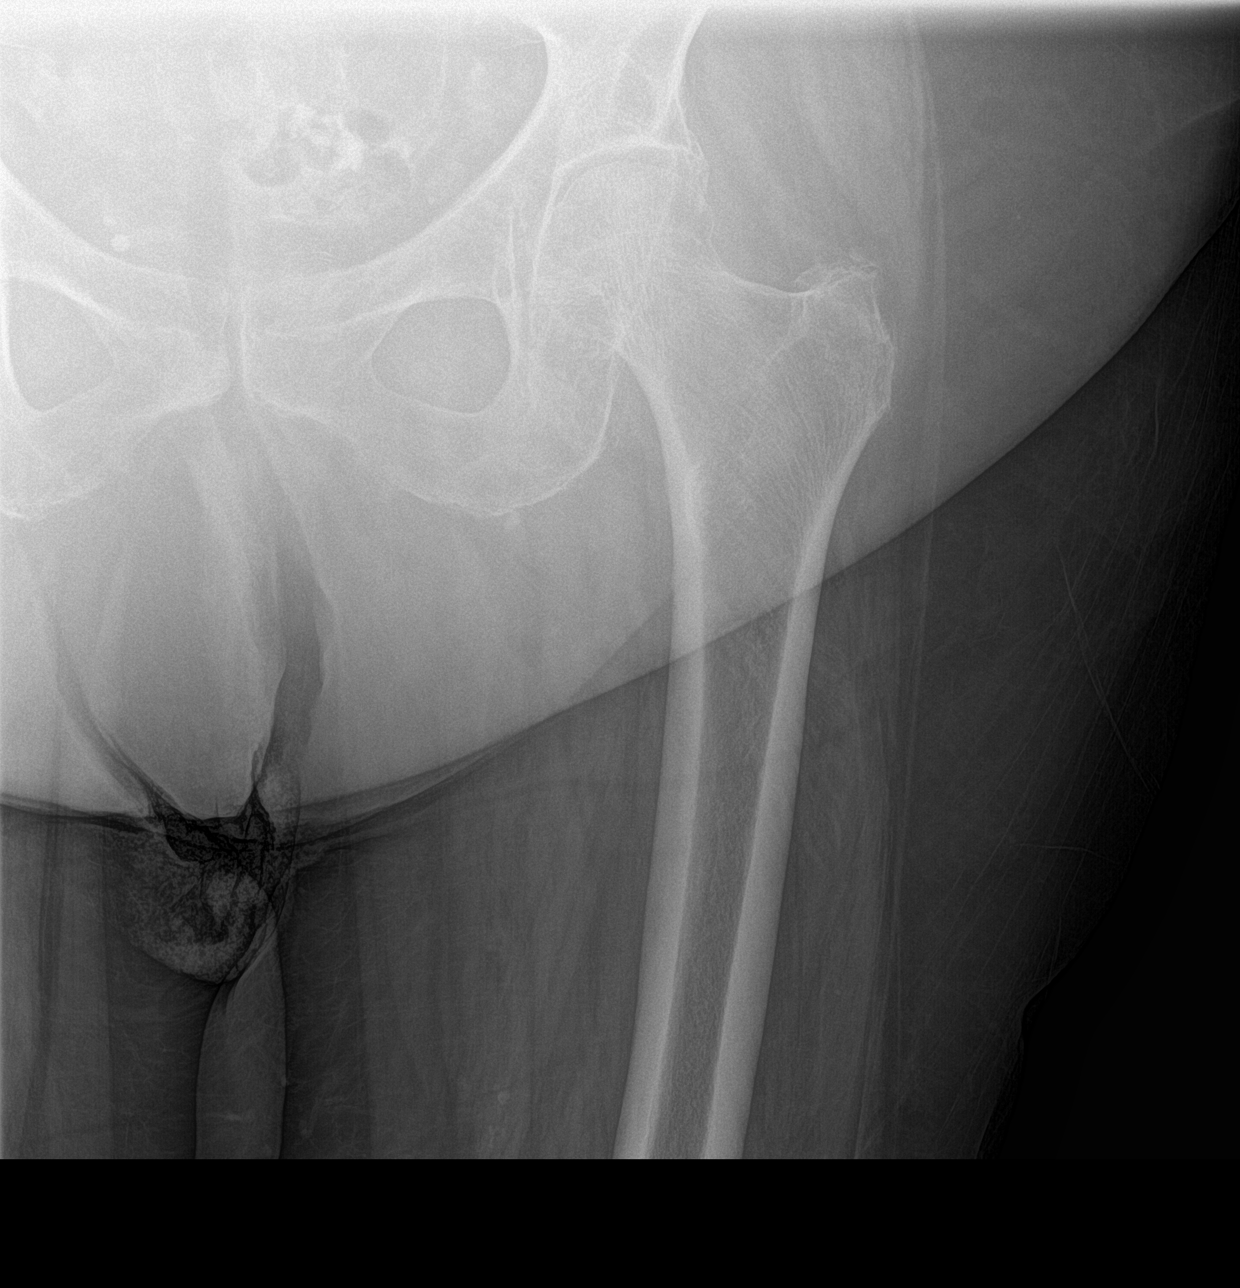
[im 3/3]
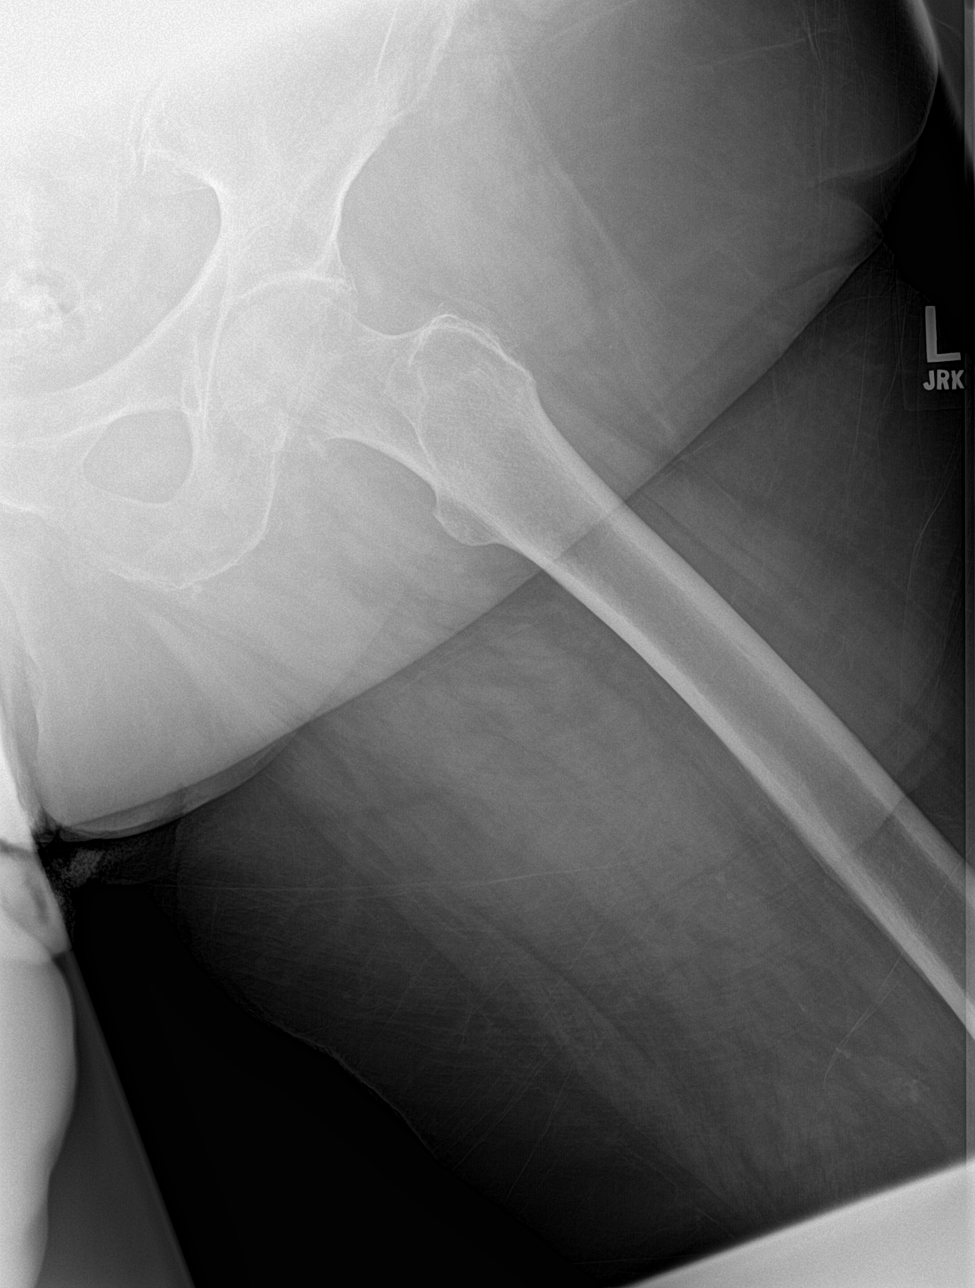

[3 of 3 positions shown; findings below may reference images not displayed]

FINDINGS: No fracture or dislocation is seen.

Mild degenerative changes of the bilateral hips.

Visualized bony pelvis appears intact.

Degenerative changes of the lower lumbar spine.

Vascular calcifications.
IMPRESSION: No fracture or dislocation is seen.

Mild degenerative changes of the bilateral hips.

## 2016-05-14 IMAGING — CR DG LUMBAR SPINE COMPLETE 4+V
1 series · 5 of 5 positions shown · non-contrast
Comparison: CT 10/11/2013

CLINICAL DATA: Fall 3 weeks ago at home, landing on left side. Low
back pain bowel laying down, sitting or standing. Difficulty bearing
weight and walking today.

EXAM:
LUMBAR SPINE - COMPLETE 4+ VIEW

[Series 1: dxr lumbar spine with obliques · 0.14mm/px · 5 of 5 slices shown]
[im 1/5]
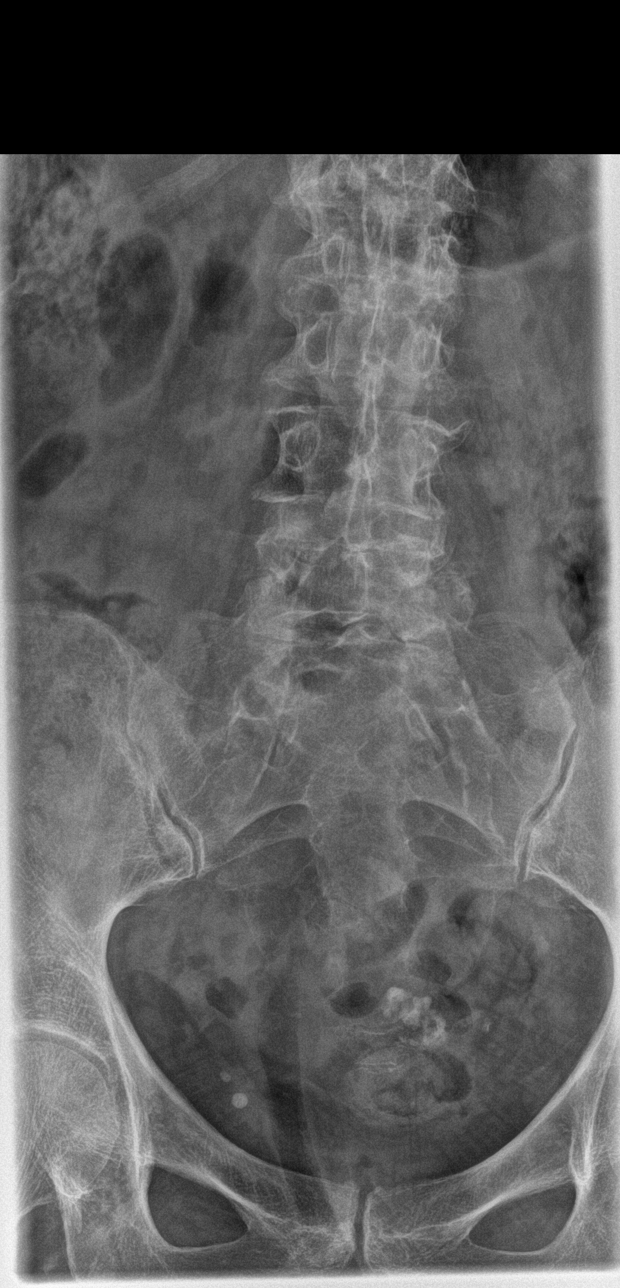
[im 2/5]
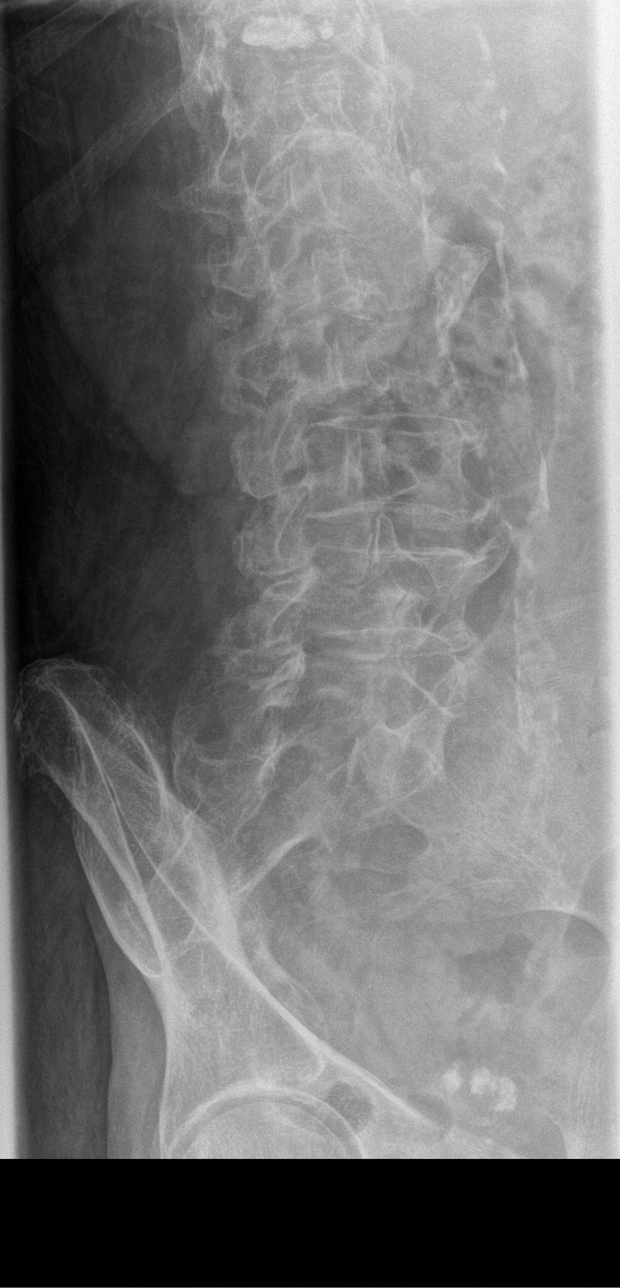
[im 3/5]
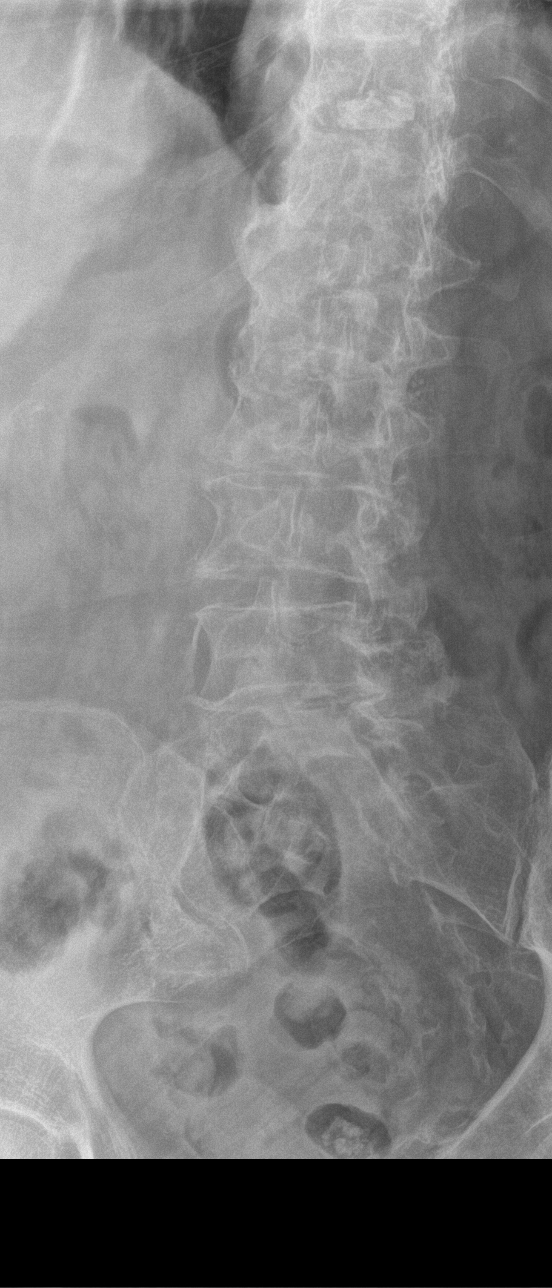
[im 4/5]
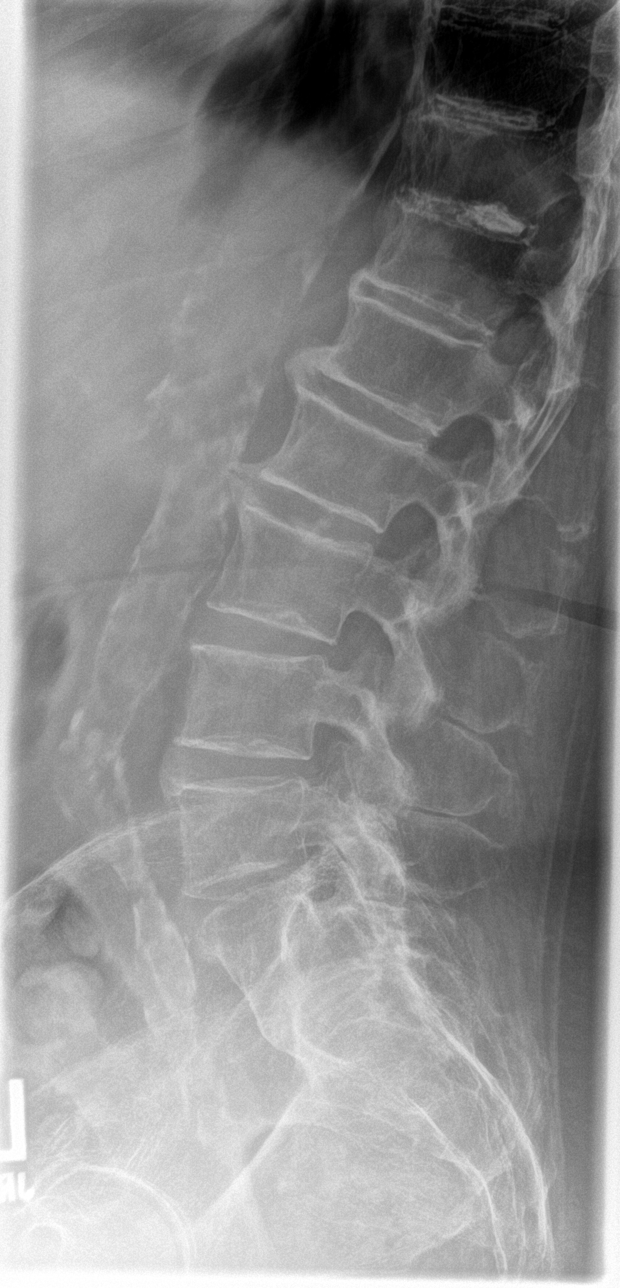
[im 5/5]
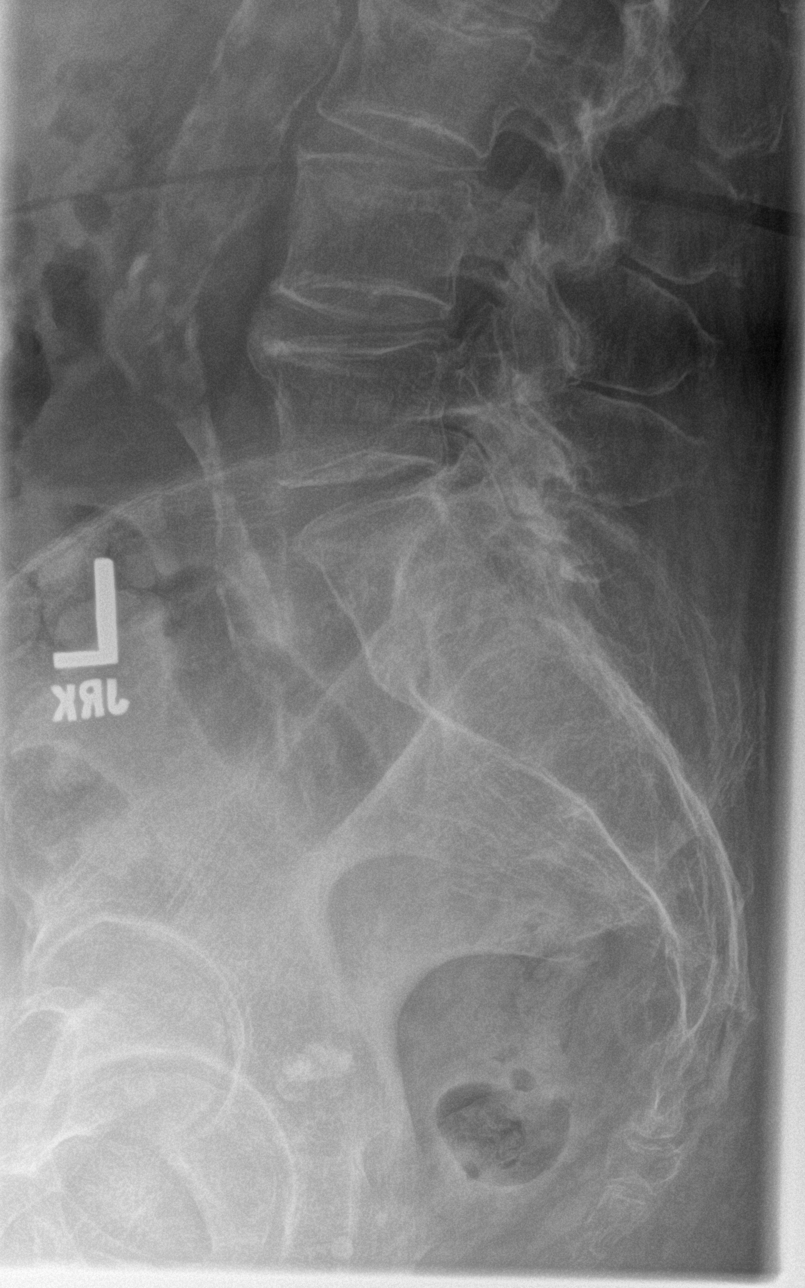

[5 of 5 positions shown; findings below may reference images not displayed]

FINDINGS: Diffuse osteopenia. Degenerative disc disease changes throughout the
lumbar spine. Degenerative facet disease. No fracture or
malalignment. SI joints are symmetric and unremarkable. Aorta is
heavily calcified, non aneurysmal.
IMPRESSION: Osteopenia.  Degenerative changes.  No acute findings.

## 2016-08-23 IMAGING — CR DG CHEST 2V
1 series · 2 of 2 positions shown · non-contrast
Comparison: 10/05/2014

CLINICAL DATA: Shortness of breath, cough, chest pain

EXAM:
CHEST  2 VIEW

[Series 1: dxr chest pa (or ap) and lateral · 0.14mm/px · 2 of 2 slices shown]
[im 1/2]
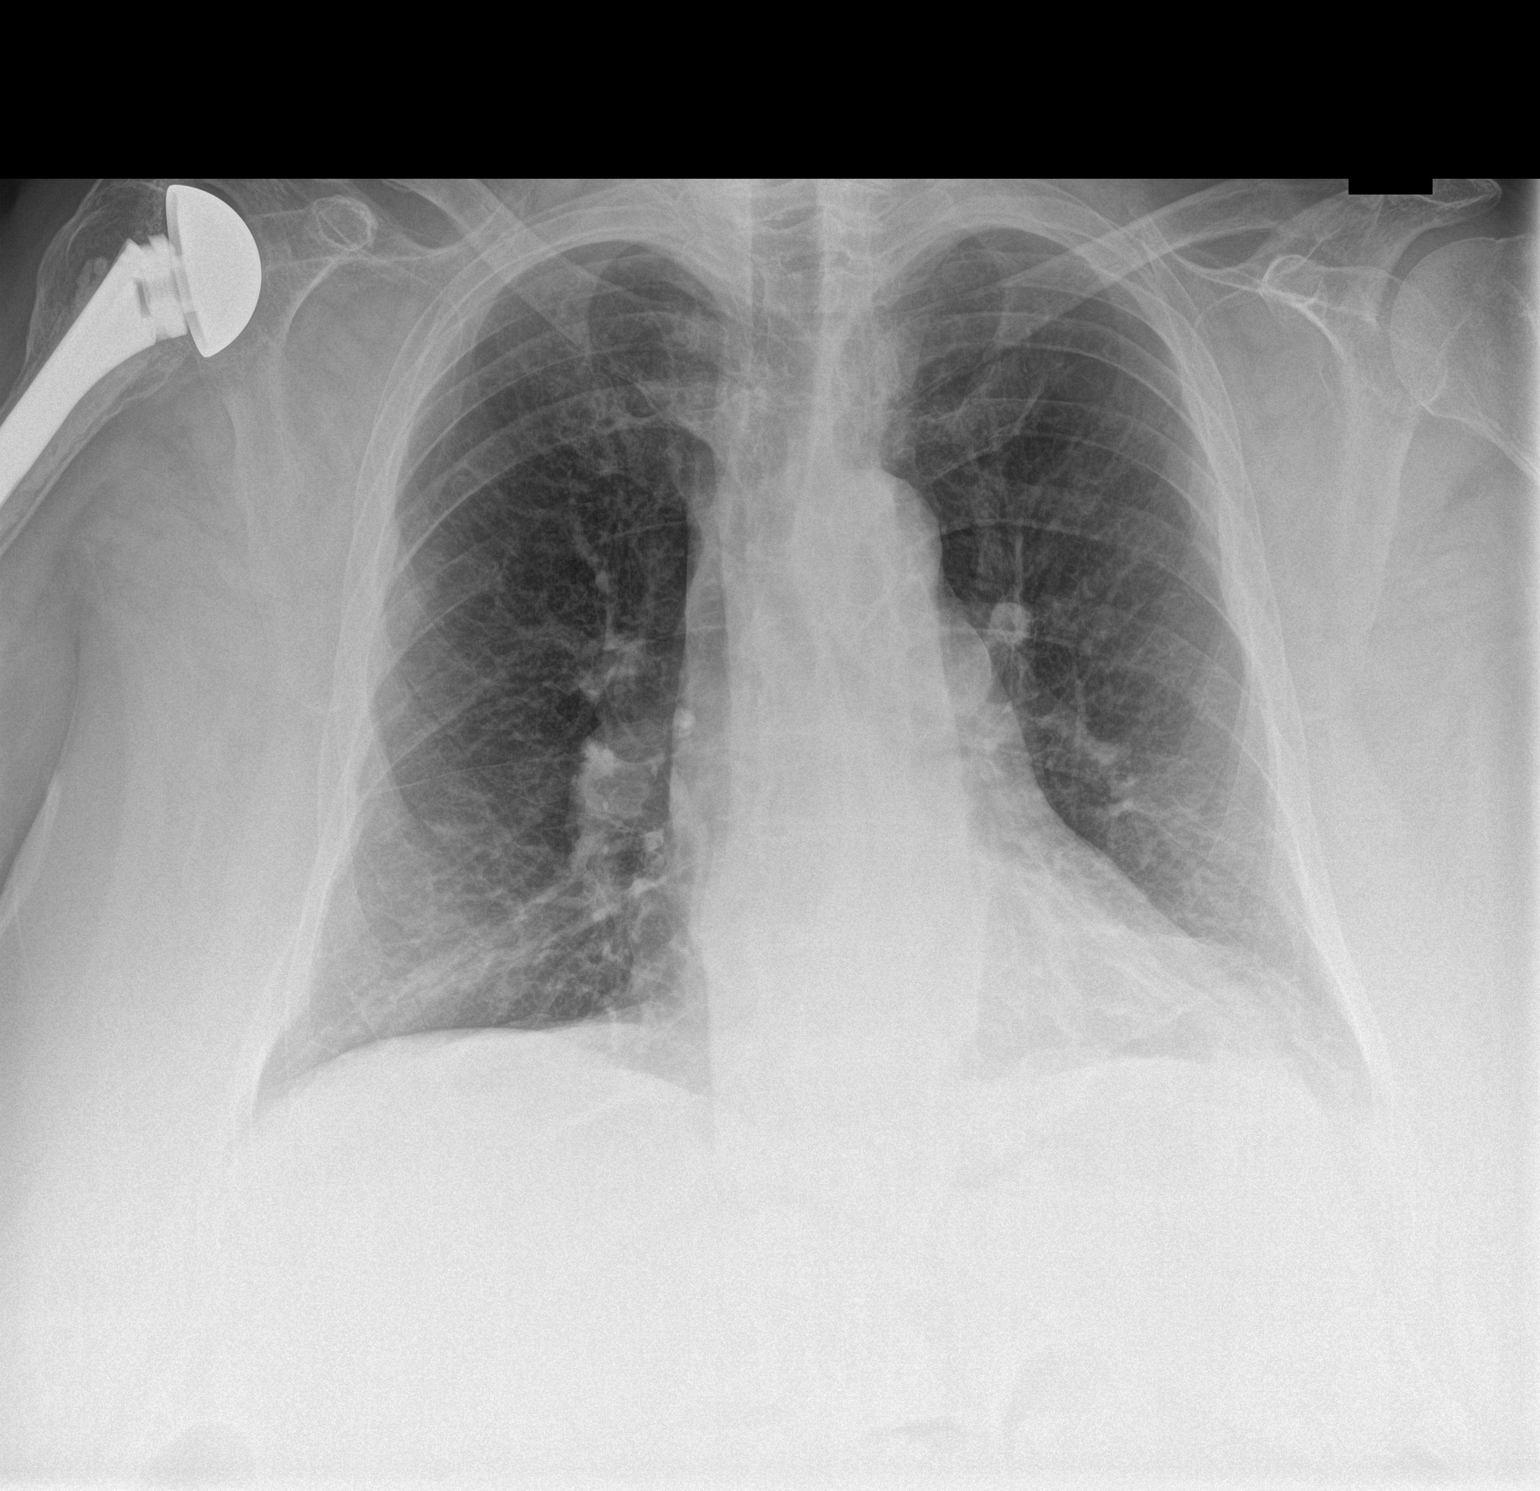
[im 2/2]
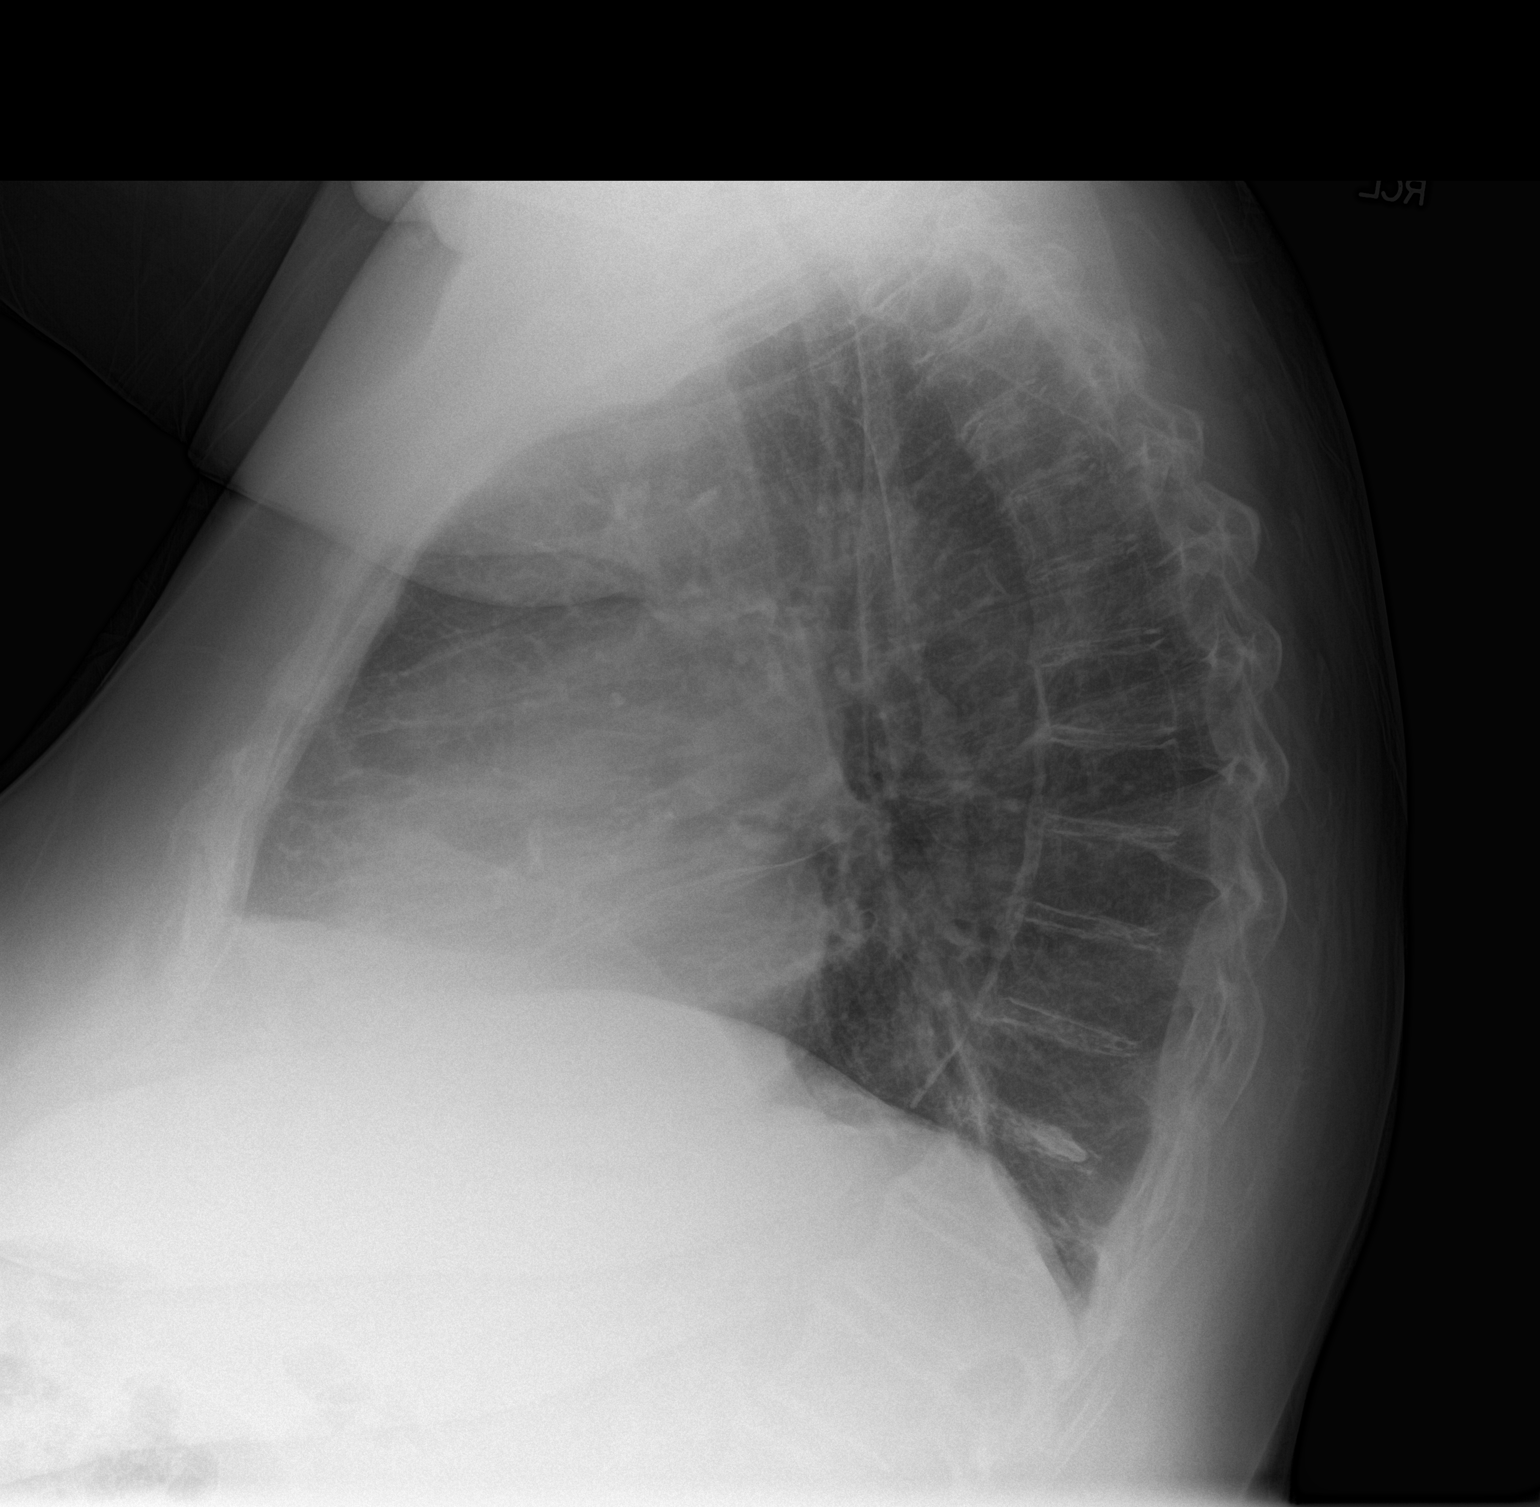

[2 of 2 positions shown; findings below may reference images not displayed]

FINDINGS: Cardiomediastinal silhouette is stable. Right shoulder prosthesis
again noted. Hyperinflation again noted. Osteopenia and degenerative
changes thoracic spine. Stable left basilar atelectasis, scarring or
chronic infiltrate. No pulmonary edema.
IMPRESSION: Hyperinflation again noted. Stable left basilar atelectasis,
scarring or chronic infiltrate. No pulmonary edema. Osteopenia and
degenerative changes thoracic spine.

## 2016-09-02 IMAGING — CR DG CHEST 1V PORT
1 series · 1 of 1 positions shown · non-contrast
Comparison: Chest radiograph performed 01/14/2015

CLINICAL DATA: Acute onset of weakness. Found down. Productive
cough. Initial encounter.

EXAM:
PORTABLE CHEST - 1 VIEW

[ap]
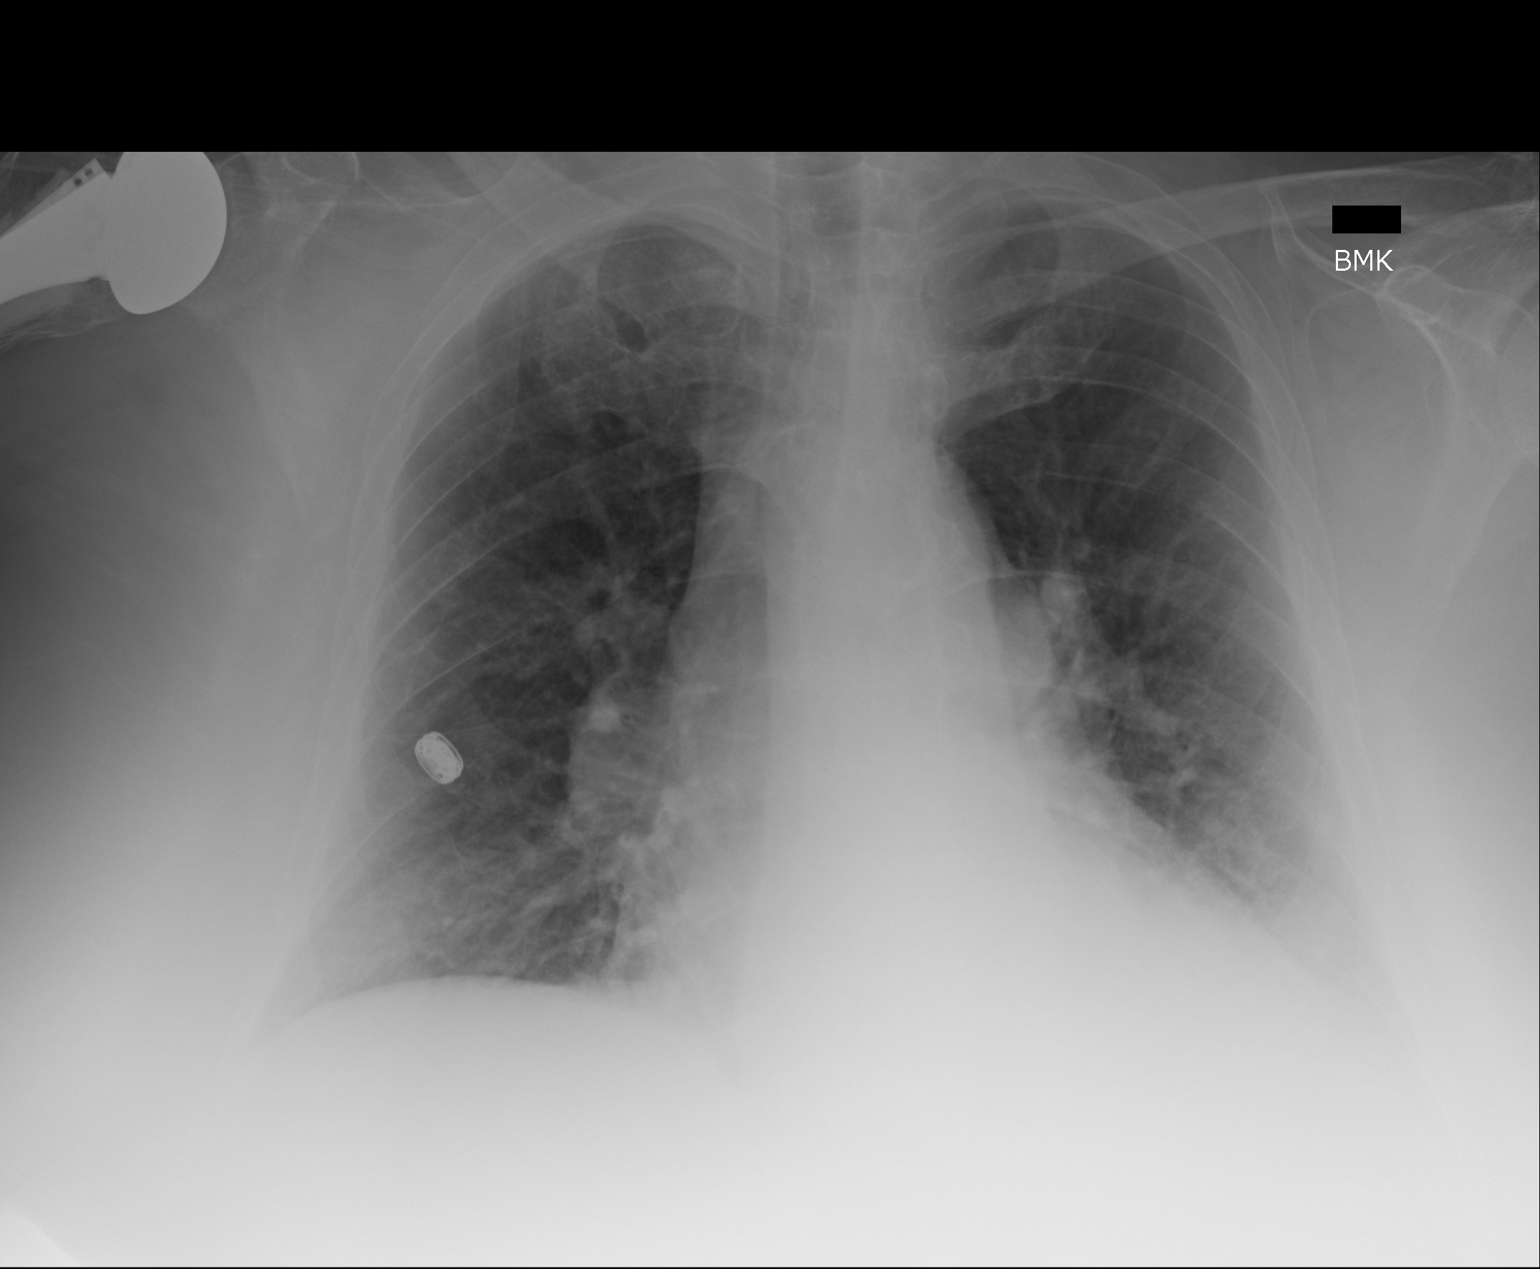

[1 of 1 positions shown; findings below may reference images not displayed]

FINDINGS: The lungs are well-aerated. Retrocardiac airspace opacity could
reflect pneumonia, given the patient's symptoms. Mild right
perihilar airspace opacity is also seen. A small left pleural
effusion is suspected. No pneumothorax is identified.

The cardiomediastinal silhouette is borderline enlarged. No acute
osseous abnormalities are seen. A right-sided shoulder
hemiarthroplasty is grossly unremarkable in appearance.
IMPRESSION: 1. Retrocardiac airspace opacity could reflect pneumonia, given the
patient's symptoms. Mild right perihilar airspace opacity also seen.
Suspect small left pleural effusion.
2. Borderline cardiomegaly.

## 2016-09-06 IMAGING — CR DG CHEST 2V
1 series · 2 of 2 positions shown · non-contrast
Comparison: 01/24/2015

CLINICAL DATA: Shortness of breath and cough, COPD

EXAM:
CHEST  2 VIEW

[Series 4: x chest ap · 0.14mm/px · 2 of 2 slices shown]
[im 1/2]
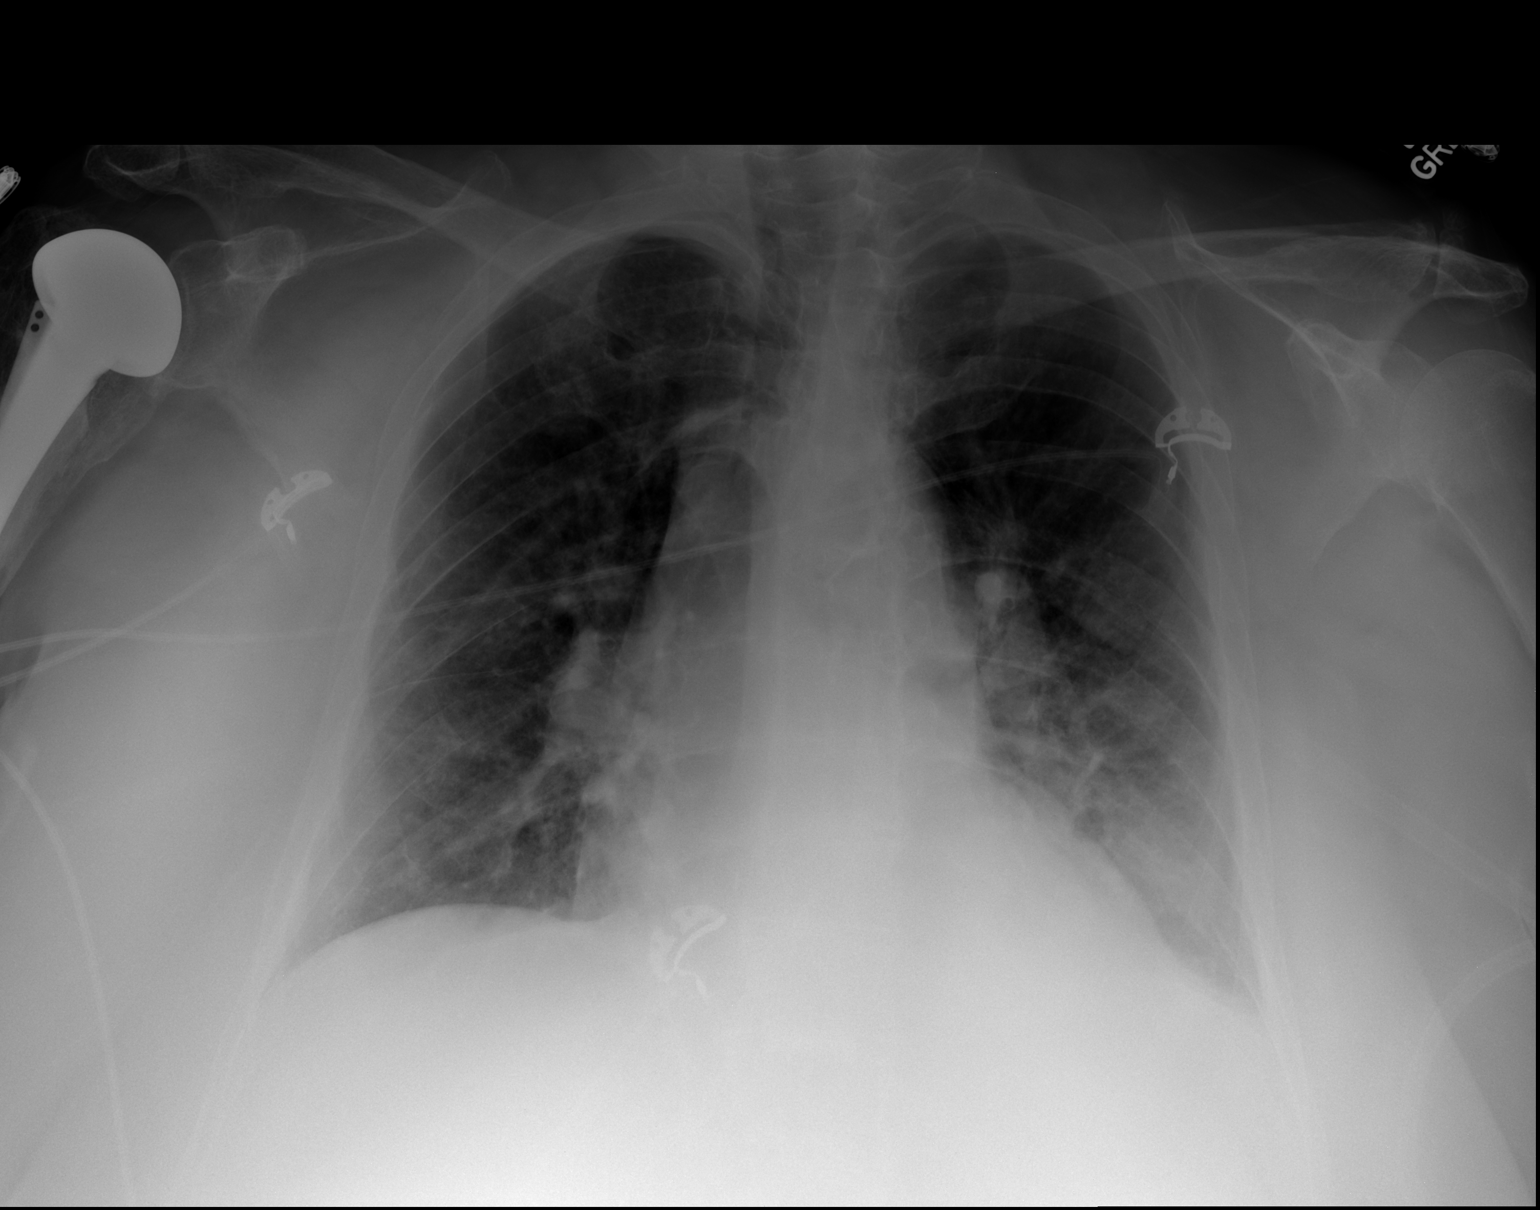
[im 2/2]
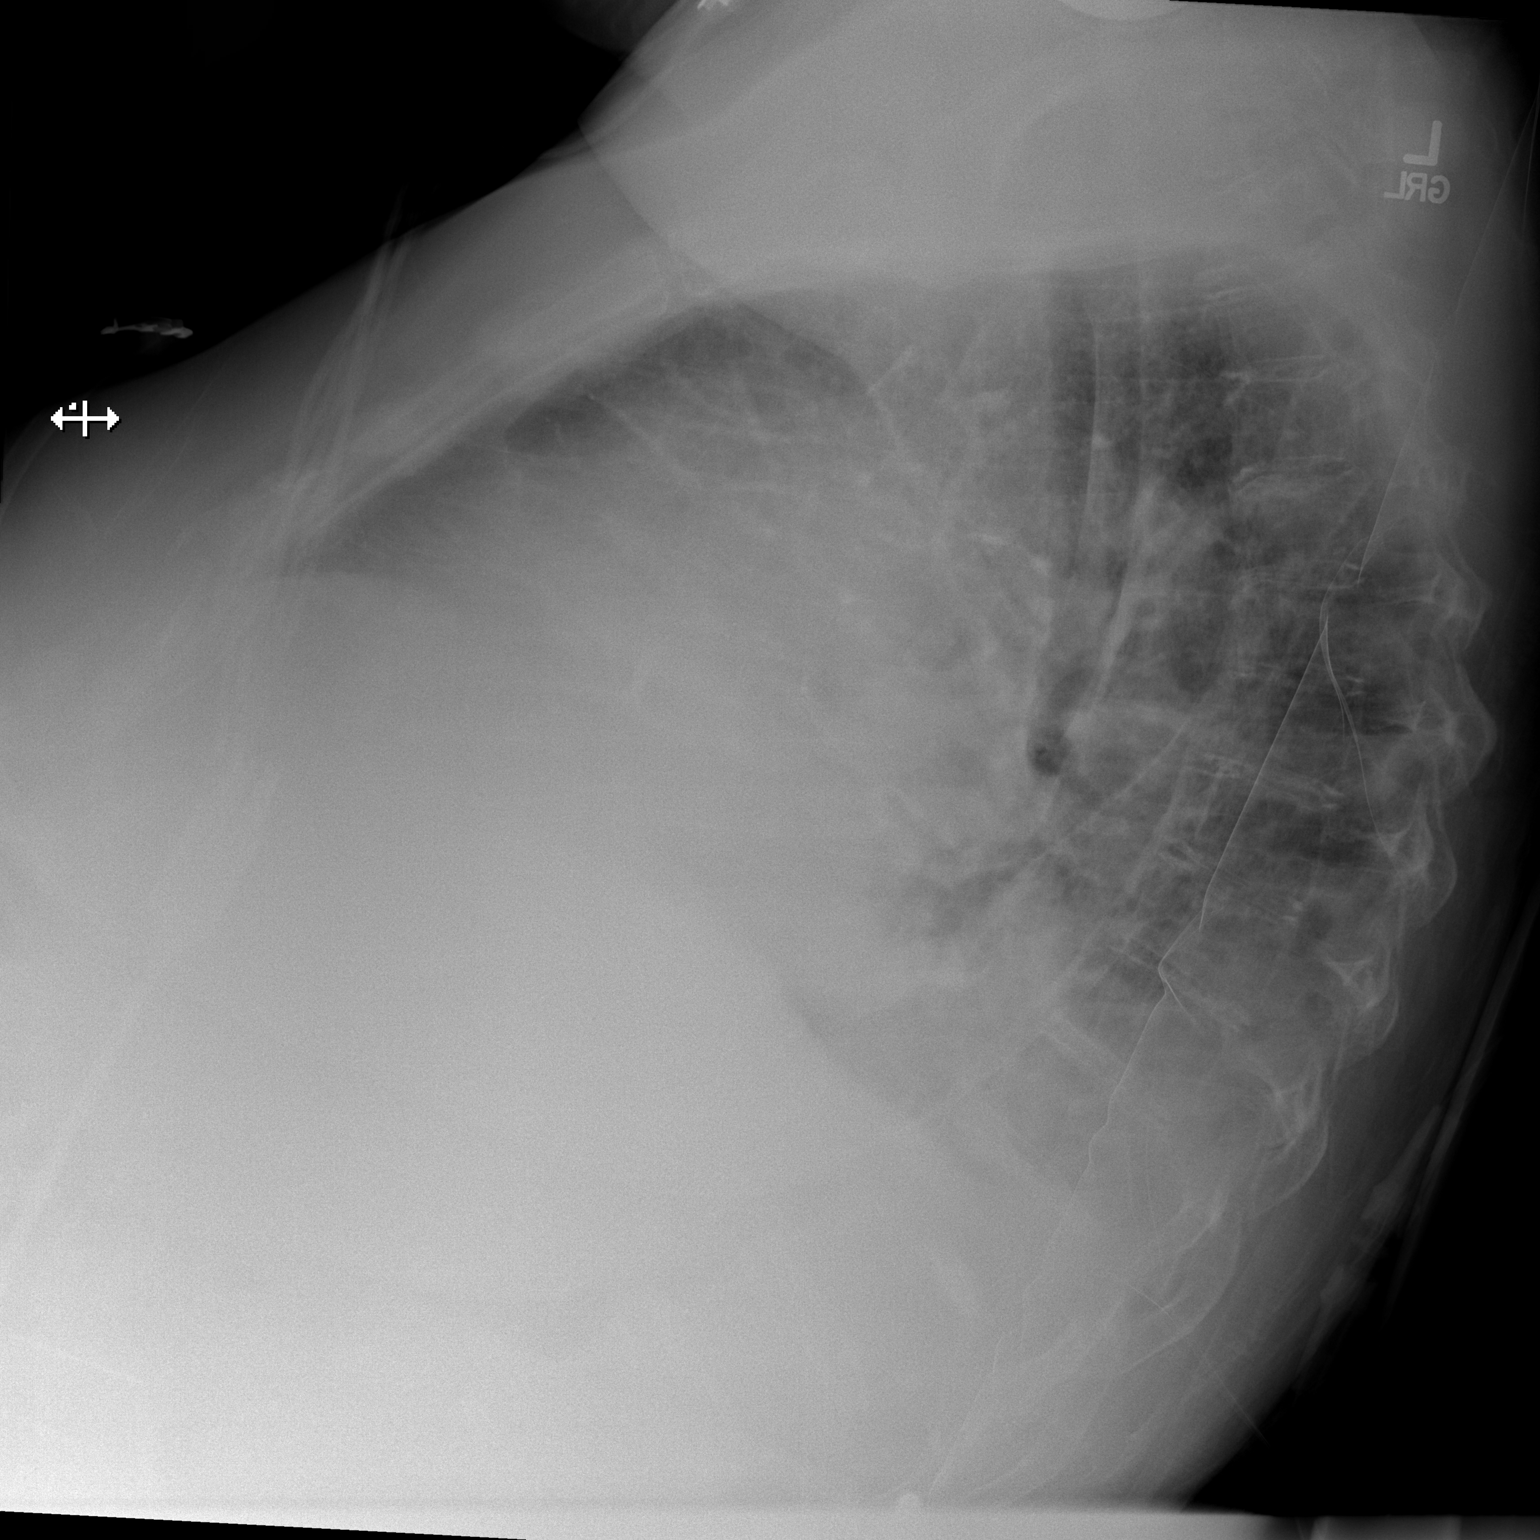

[2 of 2 positions shown; findings below may reference images not displayed]

FINDINGS: Right shoulder replacement. Moderate enlargement of the
cardiomediastinal silhouette is reidentified. Emphysematous change
noted. Patchy left lower lobe airspace opacity is reidentified.
Small left pleural effusion is again noted.
IMPRESSION: No significant change in left lower lobe airspace opacity with small
left pleural effusion which could indicate pneumonia given the
history of shortness of breath and cough. Followup to radiographic
resolution is recommended, which [REDACTED]weeks
radiographically.

## 2016-09-14 IMAGING — CR DG CHEST 2V
1 series · 2 of 2 positions shown · non-contrast
Comparison: 01/28/2015

CLINICAL DATA: Productive cough for 1 week, history of pneumonia,
COPD

EXAM:
CHEST  2 VIEW

[Series 1: kdxr chest pa (or ap) and lat · 0.14mm/px · 2 of 2 slices shown]
[im 1/2]
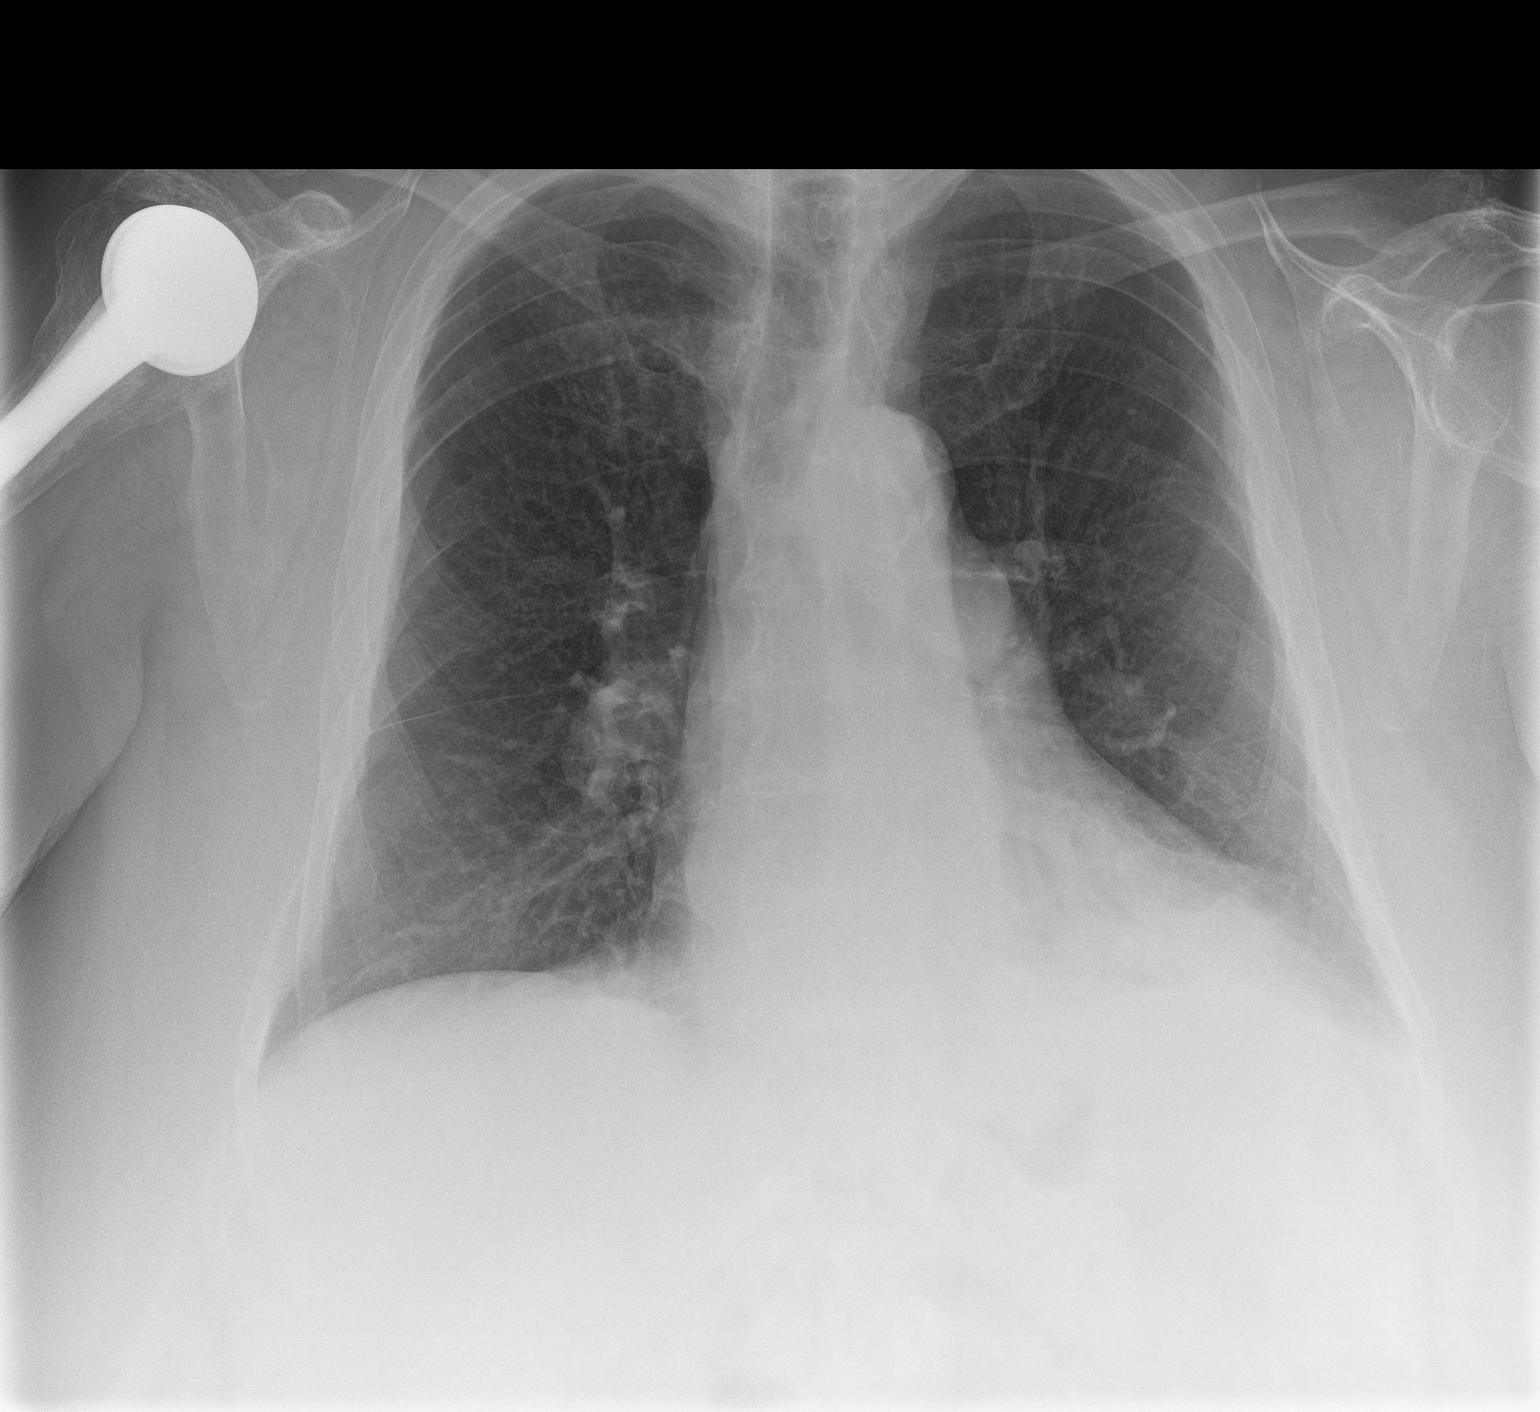
[im 2/2]
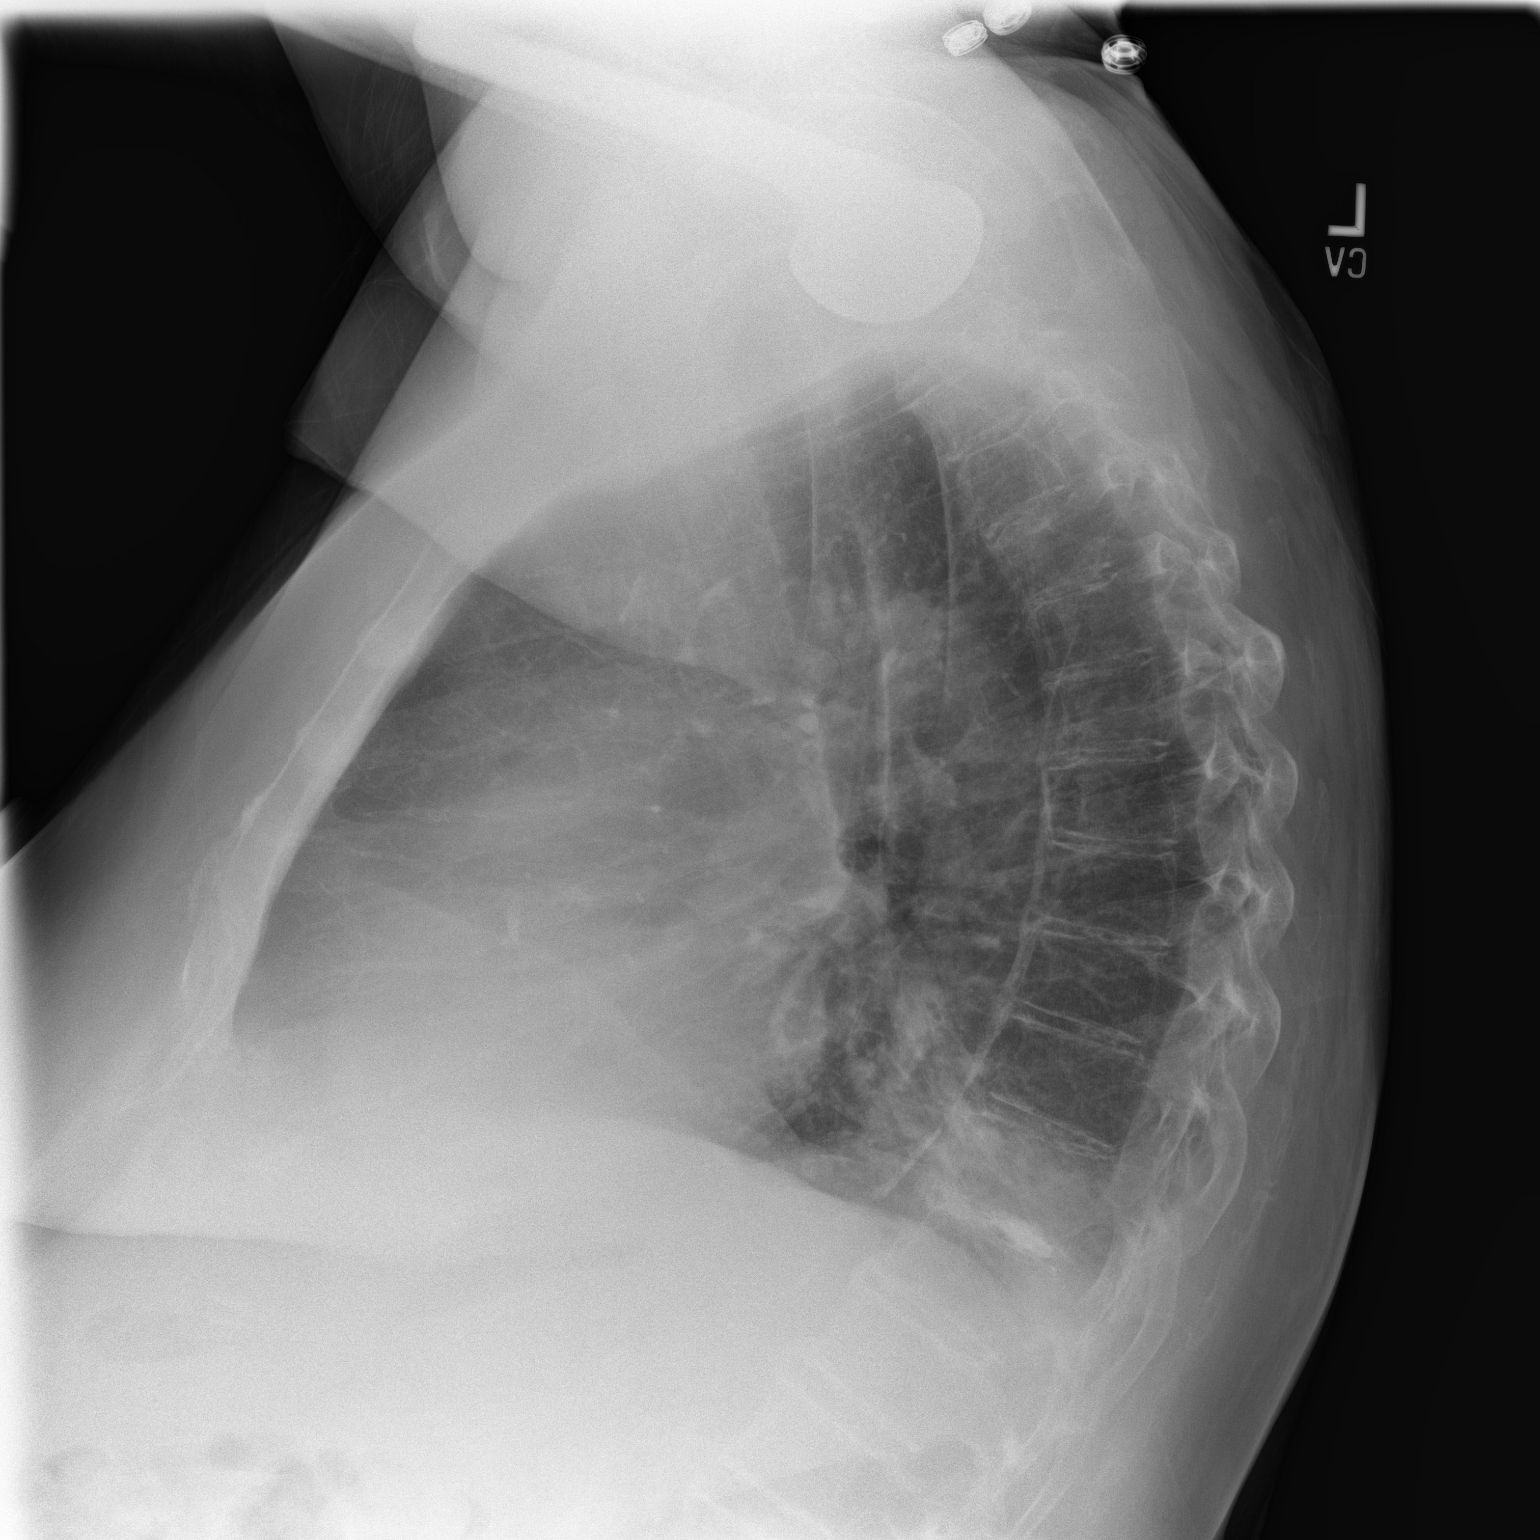

[2 of 2 positions shown; findings below may reference images not displayed]

FINDINGS: Cardiomediastinal silhouette is stable. There is improvement in
aeration. Persistent small residual infiltrate/ pneumonia in left
base posteriorly. Follow-up to complete resolution is recommended.
No new infiltrate. Osteopenia and mild degenerative changes thoracic
spine.
IMPRESSION: Improvement in aeration with persistent small residual infiltrate/
pneumonia in left base posteriorly. Follow-up to complete resolution
is recommended.

## 2016-10-02 ENCOUNTER — Encounter: Payer: Self-pay | Admitting: Emergency Medicine

## 2016-10-02 ENCOUNTER — Emergency Department: Payer: Medicare (Managed Care)

## 2016-10-02 DIAGNOSIS — G4730 Sleep apnea, unspecified: Secondary | ICD-10-CM | POA: Diagnosis not present

## 2016-10-02 DIAGNOSIS — N3946 Mixed incontinence: Secondary | ICD-10-CM | POA: Insufficient documentation

## 2016-10-02 DIAGNOSIS — I4510 Unspecified right bundle-branch block: Secondary | ICD-10-CM | POA: Insufficient documentation

## 2016-10-02 DIAGNOSIS — R05 Cough: Secondary | ICD-10-CM | POA: Diagnosis present

## 2016-10-02 DIAGNOSIS — K439 Ventral hernia without obstruction or gangrene: Secondary | ICD-10-CM | POA: Diagnosis not present

## 2016-10-02 DIAGNOSIS — Z886 Allergy status to analgesic agent status: Secondary | ICD-10-CM | POA: Insufficient documentation

## 2016-10-02 DIAGNOSIS — E669 Obesity, unspecified: Secondary | ICD-10-CM | POA: Insufficient documentation

## 2016-10-02 DIAGNOSIS — E1122 Type 2 diabetes mellitus with diabetic chronic kidney disease: Secondary | ICD-10-CM | POA: Diagnosis not present

## 2016-10-02 DIAGNOSIS — N390 Urinary tract infection, site not specified: Secondary | ICD-10-CM | POA: Insufficient documentation

## 2016-10-02 DIAGNOSIS — B9620 Unspecified Escherichia coli [E. coli] as the cause of diseases classified elsewhere: Secondary | ICD-10-CM | POA: Diagnosis not present

## 2016-10-02 DIAGNOSIS — I081 Rheumatic disorders of both mitral and tricuspid valves: Secondary | ICD-10-CM | POA: Diagnosis not present

## 2016-10-02 DIAGNOSIS — J9801 Acute bronchospasm: Secondary | ICD-10-CM | POA: Diagnosis present

## 2016-10-02 DIAGNOSIS — Z6841 Body Mass Index (BMI) 40.0 and over, adult: Secondary | ICD-10-CM | POA: Diagnosis not present

## 2016-10-02 DIAGNOSIS — F329 Major depressive disorder, single episode, unspecified: Secondary | ICD-10-CM | POA: Insufficient documentation

## 2016-10-02 DIAGNOSIS — I5033 Acute on chronic diastolic (congestive) heart failure: Secondary | ICD-10-CM | POA: Diagnosis not present

## 2016-10-02 DIAGNOSIS — J440 Chronic obstructive pulmonary disease with acute lower respiratory infection: Secondary | ICD-10-CM | POA: Diagnosis not present

## 2016-10-02 DIAGNOSIS — N179 Acute kidney failure, unspecified: Secondary | ICD-10-CM | POA: Diagnosis not present

## 2016-10-02 DIAGNOSIS — Z91048 Other nonmedicinal substance allergy status: Secondary | ICD-10-CM | POA: Insufficient documentation

## 2016-10-02 DIAGNOSIS — D689 Coagulation defect, unspecified: Secondary | ICD-10-CM | POA: Insufficient documentation

## 2016-10-02 DIAGNOSIS — I130 Hypertensive heart and chronic kidney disease with heart failure and stage 1 through stage 4 chronic kidney disease, or unspecified chronic kidney disease: Secondary | ICD-10-CM | POA: Diagnosis not present

## 2016-10-02 DIAGNOSIS — I83891 Varicose veins of right lower extremities with other complications: Secondary | ICD-10-CM | POA: Diagnosis not present

## 2016-10-02 DIAGNOSIS — Z87891 Personal history of nicotine dependence: Secondary | ICD-10-CM | POA: Insufficient documentation

## 2016-10-02 DIAGNOSIS — Z79899 Other long term (current) drug therapy: Secondary | ICD-10-CM | POA: Insufficient documentation

## 2016-10-02 DIAGNOSIS — E039 Hypothyroidism, unspecified: Secondary | ICD-10-CM | POA: Insufficient documentation

## 2016-10-02 DIAGNOSIS — I429 Cardiomyopathy, unspecified: Secondary | ICD-10-CM | POA: Insufficient documentation

## 2016-10-02 DIAGNOSIS — J189 Pneumonia, unspecified organism: Principal | ICD-10-CM | POA: Insufficient documentation

## 2016-10-02 DIAGNOSIS — Z885 Allergy status to narcotic agent status: Secondary | ICD-10-CM | POA: Insufficient documentation

## 2016-10-02 DIAGNOSIS — Z9660 Presence of unspecified orthopedic joint implant: Secondary | ICD-10-CM | POA: Insufficient documentation

## 2016-10-02 DIAGNOSIS — J181 Lobar pneumonia, unspecified organism: Secondary | ICD-10-CM | POA: Diagnosis present

## 2016-10-02 DIAGNOSIS — N189 Chronic kidney disease, unspecified: Secondary | ICD-10-CM | POA: Insufficient documentation

## 2016-10-02 DIAGNOSIS — M1990 Unspecified osteoarthritis, unspecified site: Secondary | ICD-10-CM | POA: Insufficient documentation

## 2016-10-02 DIAGNOSIS — Z8249 Family history of ischemic heart disease and other diseases of the circulatory system: Secondary | ICD-10-CM | POA: Insufficient documentation

## 2016-10-02 DIAGNOSIS — M8580 Other specified disorders of bone density and structure, unspecified site: Secondary | ICD-10-CM | POA: Insufficient documentation

## 2016-10-02 LAB — CBC
HEMATOCRIT: 32.1 % — AB (ref 35.0–47.0)
HEMOGLOBIN: 11 g/dL — AB (ref 12.0–16.0)
MCH: 31.9 pg (ref 26.0–34.0)
MCHC: 34.2 g/dL (ref 32.0–36.0)
MCV: 93.3 fL (ref 80.0–100.0)
Platelets: 223 10*3/uL (ref 150–440)
RBC: 3.44 MIL/uL — AB (ref 3.80–5.20)
RDW: 14.2 % (ref 11.5–14.5)
WBC: 10.7 10*3/uL (ref 3.6–11.0)

## 2016-10-02 LAB — BASIC METABOLIC PANEL
ANION GAP: 8 (ref 5–15)
BUN: 32 mg/dL — ABNORMAL HIGH (ref 6–20)
CALCIUM: 9 mg/dL (ref 8.9–10.3)
CHLORIDE: 99 mmol/L — AB (ref 101–111)
CO2: 28 mmol/L (ref 22–32)
Creatinine, Ser: 1.29 mg/dL — ABNORMAL HIGH (ref 0.44–1.00)
GFR calc non Af Amer: 40 mL/min — ABNORMAL LOW (ref >60)
GFR, EST AFRICAN AMERICAN: 46 mL/min — AB (ref >60)
Glucose, Bld: 107 mg/dL — ABNORMAL HIGH (ref 65–99)
POTASSIUM: 4.5 mmol/L (ref 3.5–5.1)
Sodium: 135 mmol/L (ref 135–145)

## 2016-10-02 LAB — TROPONIN I

## 2016-10-02 LAB — BRAIN NATRIURETIC PEPTIDE: B Natriuretic Peptide: 230 pg/mL — ABNORMAL HIGH (ref 0.0–100.0)

## 2016-10-02 NOTE — ED Triage Notes (Signed)
Patient brought in by ems from home. Patient states that she had a varicose vein that started bleeding. Pressure dressing applied and bleeding controlled at this time. Patient states that while she is here she also wants to be seen for a cough and shortness of breath times one week. Patient reports that she has a history of chf and has swelling to bilateral legs and 8 pound weight gain.

## 2016-10-02 NOTE — ED Notes (Signed)
Pt arrived via EMS from home; she had a varicose vein that ruptured and was bleeding; first responders wrapped leg and pt arrived with same dressing in place;

## 2016-10-03 ENCOUNTER — Observation Stay
Admit: 2016-10-03 | Discharge: 2016-10-03 | Disposition: A | Discharge status: Home / Self Care | Payer: Medicare (Managed Care) | Attending: Internal Medicine | Admitting: Internal Medicine

## 2016-10-03 ENCOUNTER — Emergency Department: Payer: Medicare (Managed Care)

## 2016-10-03 ENCOUNTER — Observation Stay
Admission: EM | Admit: 2016-10-03 | Discharge: 2016-10-04 | Disposition: A | Discharge status: Home / Self Care | Payer: Medicare (Managed Care) | Attending: Internal Medicine | Admitting: Internal Medicine

## 2016-10-03 DIAGNOSIS — J9801 Acute bronchospasm: Secondary | ICD-10-CM

## 2016-10-03 DIAGNOSIS — R059 Cough, unspecified: Secondary | ICD-10-CM

## 2016-10-03 DIAGNOSIS — R05 Cough: Secondary | ICD-10-CM

## 2016-10-03 DIAGNOSIS — R262 Difficulty in walking, not elsewhere classified: Secondary | ICD-10-CM

## 2016-10-03 DIAGNOSIS — M6281 Muscle weakness (generalized): Secondary | ICD-10-CM

## 2016-10-03 DIAGNOSIS — J189 Pneumonia, unspecified organism: Secondary | ICD-10-CM | POA: Diagnosis present

## 2016-10-03 DIAGNOSIS — J181 Lobar pneumonia, unspecified organism: Secondary | ICD-10-CM

## 2016-10-03 LAB — BLOOD GAS, VENOUS
Acid-Base Excess: 3.6 mmol/L — ABNORMAL HIGH (ref 0.0–2.0)
Bicarbonate: 30.1 mmol/L — ABNORMAL HIGH (ref 20.0–28.0)
O2 Saturation: 59.4 %
PCO2 VEN: 52 mmHg (ref 44.0–60.0)
PH VEN: 7.37 (ref 7.250–7.430)
PO2 VEN: 32 mmHg (ref 32.0–45.0)
Patient temperature: 37

## 2016-10-03 LAB — BASIC METABOLIC PANEL
ANION GAP: 5 (ref 5–15)
BUN: 29 mg/dL — AB (ref 6–20)
CHLORIDE: 101 mmol/L (ref 101–111)
CO2: 28 mmol/L (ref 22–32)
Calcium: 8.4 mg/dL — ABNORMAL LOW (ref 8.9–10.3)
Creatinine, Ser: 1.17 mg/dL — ABNORMAL HIGH (ref 0.44–1.00)
GFR calc Af Amer: 52 mL/min — ABNORMAL LOW (ref >60)
GFR, EST NON AFRICAN AMERICAN: 45 mL/min — AB (ref >60)
Glucose, Bld: 143 mg/dL — ABNORMAL HIGH (ref 65–99)
POTASSIUM: 4.4 mmol/L (ref 3.5–5.1)
SODIUM: 134 mmol/L — AB (ref 135–145)

## 2016-10-03 LAB — TSH: TSH: 1.008 u[IU]/mL (ref 0.350–4.500)

## 2016-10-03 MED ORDER — SPIRONOLACTONE 25 MG PO TABS
25.0000 mg | ORAL_TABLET | Freq: Every day | ORAL | Status: DC
  Administered 2016-10-03 – 2016-10-04 (×2): 25 mg via ORAL
  Filled 2016-10-03 (×2): qty 1

## 2016-10-03 MED ORDER — ENOXAPARIN SODIUM 40 MG/0.4ML ~~LOC~~ SOLN: 40.0000 mg | Freq: Two times a day (BID) | SUBCUTANEOUS | Status: DC

## 2016-10-03 MED ORDER — GUAIFENESIN ER 600 MG PO TB12
600.0000 mg | ORAL_TABLET | Freq: Two times a day (BID) | ORAL | Status: DC
  Administered 2016-10-03: 600 mg via ORAL
  Filled 2016-10-03: qty 1

## 2016-10-03 MED ORDER — CARVEDILOL 6.25 MG PO TABS
6.2500 mg | ORAL_TABLET | Freq: Two times a day (BID) | ORAL | Status: DC
  Administered 2016-10-03 – 2016-10-04 (×2): 6.25 mg via ORAL
  Filled 2016-10-03 (×3): qty 1

## 2016-10-03 MED ORDER — OXYCODONE-ACETAMINOPHEN 5-325 MG PO TABS
1.0000 | ORAL_TABLET | ORAL | Status: DC | PRN
  Administered 2016-10-03 – 2016-10-04 (×2): 1 via ORAL
  Filled 2016-10-03 (×2): qty 1

## 2016-10-03 MED ORDER — LEVOFLOXACIN 500 MG PO TABS
500.0000 mg | ORAL_TABLET | Freq: Once | ORAL | Status: AC
  Administered 2016-10-04: 500 mg via ORAL
  Filled 2016-10-03: qty 1

## 2016-10-03 MED ORDER — ACETAMINOPHEN 650 MG RE SUPP: 650.0000 mg | Freq: Four times a day (QID) | RECTAL | Status: DC | PRN

## 2016-10-03 MED ORDER — IPRATROPIUM-ALBUTEROL 0.5-2.5 (3) MG/3ML IN SOLN
3.0000 mL | Freq: Once | RESPIRATORY_TRACT | Status: AC
  Administered 2016-10-03: 3 mL via RESPIRATORY_TRACT
  Filled 2016-10-03: qty 3

## 2016-10-03 MED ORDER — ACETAMINOPHEN 325 MG PO TABS
650.0000 mg | ORAL_TABLET | Freq: Four times a day (QID) | ORAL | Status: DC | PRN
  Filled 2016-10-03: qty 2

## 2016-10-03 MED ORDER — CLONAZEPAM 0.5 MG PO TABS: 0.5000 mg | ORAL_TABLET | Freq: Three times a day (TID) | ORAL | Status: DC | PRN

## 2016-10-03 MED ORDER — IPRATROPIUM-ALBUTEROL 0.5-2.5 (3) MG/3ML IN SOLN
3.0000 mL | Freq: Four times a day (QID) | RESPIRATORY_TRACT | Status: DC
  Administered 2016-10-03 – 2016-10-04 (×3): 3 mL via RESPIRATORY_TRACT
  Filled 2016-10-03 (×3): qty 3

## 2016-10-03 MED ORDER — SIMVASTATIN 20 MG PO TABS
40.0000 mg | ORAL_TABLET | Freq: Every day | ORAL | Status: DC
  Administered 2016-10-03: 40 mg via ORAL
  Filled 2016-10-03: qty 2

## 2016-10-03 MED ORDER — PREDNISONE 50 MG PO TABS
50.0000 mg | ORAL_TABLET | Freq: Every day | ORAL | Status: AC
  Administered 2016-10-03: 50 mg via ORAL
  Filled 2016-10-03: qty 1

## 2016-10-03 MED ORDER — GUAIFENESIN-DM 100-10 MG/5ML PO SYRP
10.0000 mL | ORAL_SOLUTION | Freq: Four times a day (QID) | ORAL | Status: DC | PRN
Start: 2016-10-03 — End: 2016-10-04
  Administered 2016-10-03: 10 mL via ORAL
  Filled 2016-10-03: qty 10

## 2016-10-03 MED ORDER — BENZONATATE 100 MG PO CAPS
200.0000 mg | ORAL_CAPSULE | Freq: Three times a day (TID) | ORAL | Status: DC | PRN
  Administered 2016-10-03 (×2): 200 mg via ORAL
  Filled 2016-10-03 (×2): qty 2

## 2016-10-03 MED ORDER — PREDNISONE 20 MG PO TABS: 30.0000 mg | ORAL_TABLET | Freq: Every day | ORAL | Status: DC

## 2016-10-03 MED ORDER — TIOTROPIUM BROMIDE MONOHYDRATE 18 MCG IN CAPS
18.0000 ug | ORAL_CAPSULE | Freq: Every day | RESPIRATORY_TRACT | Status: DC
  Administered 2016-10-03 – 2016-10-04 (×2): 18 ug via RESPIRATORY_TRACT
  Filled 2016-10-03: qty 5

## 2016-10-03 MED ORDER — HEPARIN SODIUM (PORCINE) 5000 UNIT/ML IJ SOLN
5000.0000 [IU] | Freq: Three times a day (TID) | INTRAMUSCULAR | Status: DC
  Administered 2016-10-03 – 2016-10-04 (×4): 5000 [IU] via SUBCUTANEOUS
  Filled 2016-10-03 (×4): qty 1

## 2016-10-03 MED ORDER — SODIUM CHLORIDE 0.9 % IV SOLN
INTRAVENOUS | Status: AC
Start: 2016-10-03 — End: 2016-10-04
  Administered 2016-10-04: via INTRAVENOUS

## 2016-10-03 MED ORDER — DARIFENACIN HYDROBROMIDE ER 7.5 MG PO TB24
15.0000 mg | ORAL_TABLET | Freq: Every day | ORAL | Status: DC
  Administered 2016-10-03 – 2016-10-04 (×2): 15 mg via ORAL
  Filled 2016-10-03 (×2): qty 2

## 2016-10-03 MED ORDER — DOCUSATE SODIUM 100 MG PO CAPS
100.0000 mg | ORAL_CAPSULE | Freq: Two times a day (BID) | ORAL | Status: DC
Start: 2016-10-03 — End: 2016-10-04
  Administered 2016-10-03 – 2016-10-04 (×3): 100 mg via ORAL
  Filled 2016-10-03 (×3): qty 1

## 2016-10-03 MED ORDER — FUROSEMIDE 40 MG PO TABS
60.0000 mg | ORAL_TABLET | Freq: Four times a day (QID) | ORAL | Status: AC
  Administered 2016-10-03 (×2): 60 mg via ORAL
  Filled 2016-10-03 (×2): qty 1

## 2016-10-03 MED ORDER — LIOTHYRONINE SODIUM 5 MCG PO TABS
25.0000 ug | ORAL_TABLET | Freq: Every day | ORAL | Status: DC
  Administered 2016-10-03 – 2016-10-04 (×2): 25 ug via ORAL
  Filled 2016-10-03 (×2): qty 5

## 2016-10-03 MED ORDER — PREDNISONE 20 MG PO TABS
40.0000 mg | ORAL_TABLET | Freq: Every day | ORAL | Status: AC
  Administered 2016-10-04: 40 mg via ORAL
  Filled 2016-10-03: qty 2

## 2016-10-03 MED ORDER — PREDNISONE 10 MG PO TABS: 10.0000 mg | ORAL_TABLET | Freq: Every day | ORAL | Status: DC

## 2016-10-03 MED ORDER — MAGNESIUM OXIDE 400 (241.3 MG) MG PO TABS
400.0000 mg | ORAL_TABLET | Freq: Every day | ORAL | Status: DC
  Administered 2016-10-03 – 2016-10-04 (×2): 400 mg via ORAL
  Filled 2016-10-03 (×2): qty 1

## 2016-10-03 MED ORDER — TRAZODONE HCL 50 MG PO TABS: 50.0000 mg | ORAL_TABLET | Freq: Every evening | ORAL | Status: DC | PRN

## 2016-10-03 MED ORDER — PREDNISONE 20 MG PO TABS: 20.0000 mg | ORAL_TABLET | Freq: Every day | ORAL | Status: DC

## 2016-10-03 MED ORDER — FUROSEMIDE 40 MG PO TABS: 60.0000 mg | ORAL_TABLET | Freq: Every day | ORAL | Status: DC

## 2016-10-03 MED ORDER — LEVOFLOXACIN 500 MG PO TABS: 250.0000 mg | ORAL_TABLET | Freq: Every day | ORAL | Status: DC

## 2016-10-03 MED ORDER — HYDROCOD POLST-CPM POLST ER 10-8 MG/5ML PO SUER
5.0000 mL | Freq: Once | ORAL | Status: AC
  Administered 2016-10-03: 5 mL via ORAL
  Filled 2016-10-03: qty 5

## 2016-10-03 MED ORDER — METHYLPREDNISOLONE SODIUM SUCC 125 MG IJ SOLR
125.0000 mg | Freq: Once | INTRAMUSCULAR | Status: AC
  Administered 2016-10-03: 125 mg via INTRAVENOUS
  Filled 2016-10-03: qty 2

## 2016-10-03 MED ORDER — MOMETASONE FURO-FORMOTEROL FUM 200-5 MCG/ACT IN AERO
2.0000 | INHALATION_SPRAY | Freq: Two times a day (BID) | RESPIRATORY_TRACT | Status: DC
  Administered 2016-10-03 – 2016-10-04 (×3): 2 via RESPIRATORY_TRACT
  Filled 2016-10-03: qty 8.8

## 2016-10-03 MED ORDER — POTASSIUM CHLORIDE CRYS ER 10 MEQ PO TBCR
10.0000 meq | EXTENDED_RELEASE_TABLET | Freq: Every day | ORAL | Status: DC
  Administered 2016-10-03 – 2016-10-04 (×2): 10 meq via ORAL
  Filled 2016-10-03 (×2): qty 1

## 2016-10-03 MED ORDER — PRIMIDONE 50 MG PO TABS
50.0000 mg | ORAL_TABLET | Freq: Two times a day (BID) | ORAL | Status: DC
  Administered 2016-10-03 – 2016-10-04 (×3): 50 mg via ORAL
  Filled 2016-10-03 (×3): qty 1

## 2016-10-03 MED ORDER — DEXTROSE 5 % IV SOLN
500.0000 mg | Freq: Once | INTRAVENOUS | Status: AC
  Administered 2016-10-03: 500 mg via INTRAVENOUS
  Filled 2016-10-03: qty 500

## 2016-10-03 MED ORDER — FUROSEMIDE 20 MG PO TABS: 20.0000 mg | ORAL_TABLET | Freq: Two times a day (BID) | ORAL | Status: DC

## 2016-10-03 MED ORDER — BUPROPION HCL ER (XL) 300 MG PO TB24
300.0000 mg | ORAL_TABLET | Freq: Every day | ORAL | Status: DC
  Administered 2016-10-03 – 2016-10-04 (×2): 300 mg via ORAL
  Filled 2016-10-03 (×2): qty 1

## 2016-10-03 MED ORDER — ESTRADIOL 0.1 MG/GM VA CREA
1.0000 | TOPICAL_CREAM | VAGINAL | Status: DC
  Filled 2016-10-03: qty 42.5

## 2016-10-03 MED ORDER — FUROSEMIDE 20 MG PO TABS: 20.0000 mg | ORAL_TABLET | Freq: Every day | ORAL | Status: DC

## 2016-10-03 MED ORDER — DEXTROSE 5 % IV SOLN: 1.0000 g | Freq: Once | INTRAVENOUS | Status: DC

## 2016-10-03 MED ORDER — CEFTRIAXONE SODIUM-DEXTROSE 1-3.74 GM-% IV SOLR
1.0000 g | Freq: Once | INTRAVENOUS | Status: DC
  Filled 2016-10-03: qty 50

## 2016-10-03 MED ORDER — ONDANSETRON HCL 4 MG PO TABS: 4.0000 mg | ORAL_TABLET | Freq: Four times a day (QID) | ORAL | Status: DC | PRN

## 2016-10-03 MED ORDER — ALBUTEROL SULFATE (2.5 MG/3ML) 0.083% IN NEBU
2.5000 mg | INHALATION_SOLUTION | Freq: Once | RESPIRATORY_TRACT | Status: AC
  Administered 2016-10-03: 2.5 mg via RESPIRATORY_TRACT
  Filled 2016-10-03: qty 3

## 2016-10-03 MED ORDER — IPRATROPIUM-ALBUTEROL 0.5-2.5 (3) MG/3ML IN SOLN
3.0000 mL | RESPIRATORY_TRACT | Status: DC | PRN
  Administered 2016-10-03: 3 mL via RESPIRATORY_TRACT
  Filled 2016-10-03: qty 3

## 2016-10-03 MED ORDER — VITAMIN D (ERGOCALCIFEROL) 1.25 MG (50000 UNIT) PO CAPS: 50000.0000 [IU] | ORAL_CAPSULE | ORAL | Status: DC

## 2016-10-03 MED ORDER — SODIUM CHLORIDE 0.9 % IV SOLN
INTRAVENOUS | Status: DC
  Administered 2016-10-03 (×3): via INTRAVENOUS

## 2016-10-03 MED ORDER — FLUOXETINE HCL 20 MG PO CAPS
40.0000 mg | ORAL_CAPSULE | Freq: Every day | ORAL | Status: DC
  Administered 2016-10-04: 40 mg via ORAL
  Filled 2016-10-03 (×2): qty 2

## 2016-10-03 MED ORDER — CEFTRIAXONE SODIUM-DEXTROSE 1-3.74 GM-% IV SOLR: 1.0000 g | INTRAVENOUS | Status: DC

## 2016-10-03 MED ORDER — ONDANSETRON HCL 4 MG/2ML IJ SOLN: 4.0000 mg | Freq: Four times a day (QID) | INTRAMUSCULAR | Status: DC | PRN

## 2016-10-03 MED ORDER — MONTELUKAST SODIUM 10 MG PO TABS
10.0000 mg | ORAL_TABLET | Freq: Every day | ORAL | Status: DC
  Administered 2016-10-03: 10 mg via ORAL
  Filled 2016-10-03: qty 1

## 2016-10-03 MED ORDER — NYSTATIN 100000 UNIT/GM EX POWD
Freq: Three times a day (TID) | CUTANEOUS | Status: DC
  Administered 2016-10-03 – 2016-10-04 (×3): via TOPICAL
  Filled 2016-10-03: qty 15

## 2016-10-03 NOTE — ED Notes (Signed)
As requested by Dr Dahlia Client, cardiopulmonary called to put pt on BiPap

## 2016-10-03 NOTE — Progress Notes (Addendum)
Sara Pierce NAME: Sara Pierce    MR#:  [id number]  DATE OF BIRTH:  Jan 07, 1941  SUBJECTIVE:  CHIEF COMPLAINT:  Pt is c/o cough . Denies any shortness of breath while resting  REVIEW OF SYSTEMS:  CONSTITUTIONAL: No fever, fatigue or weakness.  EYES: No blurred or double vision.  EARS, NOSE, AND THROAT: No tinnitus or ear pain.  RESPIRATORY: No cough, shortness of breath, wheezing or hemoptysis.  CARDIOVASCULAR: No chest pain, orthopnea, edema.  GASTROINTESTINAL:Reporting abdominal bulge while coughing No nausea, vomiting, diarrhea or abdominal pain.  GENITOURINARY: No dysuria, hematuria.  ENDOCRINE: No polyuria, nocturia,  HEMATOLOGY: No anemia, easy bruising or bleeding SKIN: No rash or lesion. MUSCULOSKELETAL: No joint pain or arthritis.   NEUROLOGIC: No tingling, numbness, weakness.  PSYCHIATRY: No anxiety or depression.   DRUG ALLERGIES:   Allergies  Allergen Reactions  . Aspirin Swelling  . Codeine Itching  . Tape Hives    VITALS:  Blood pressure (!) 114/45, pulse 72, temperature 98.2 F (36.8 C), temperature source Oral, resp. rate 17, height 5\' 5"  (1.651 m), weight 113.4 kg (250 lb), SpO2 97 %.  PHYSICAL EXAMINATION:  GENERAL:  75 y.o.-year-old patient lying in the bed with no acute distress.  EYES: Pupils equal, round, reactive to light and accommodation. No scleral icterus. Extraocular muscles intact.  HEENT: Head atraumatic, normocephalic. Oropharynx and nasopharynx clear.  NECK:  Supple, no jugular venous distention. No thyroid enlargement, no tenderness.  LUNGS: Normal breath sounds bilaterally, no wheezing, rales,rhonchi or crepitation. No use of accessory muscles of respiration.  CARDIOVASCULAR: S1, S2 normal. No murmurs, rubs, or gallops.  ABDOMEN: Soft, nontender, reducible abd wall hernia. Bowel sounds present. No organomegaly or mass.  EXTREMITIES: No pedal edema, cyanosis, or clubbing.   NEUROLOGIC: Cranial nerves II through XII are intact. Muscle strength 5/5 in all extremities. Sensation intact. Gait not checked.  PSYCHIATRIC: The patient is alert and oriented x 3.  SKIN: No obvious rash, lesion, or ulcer.    LABORATORY PANEL:   CBC  Recent Labs Lab 10/02/16 2248  WBC 10.7  HGB 11.0*  HCT 32.1*  PLT 223   ------------------------------------------------------------------------------------------------------------------  Chemistries   Recent Labs Lab 10/02/16 2248  NA 135  K 4.5  CL 99*  CO2 28  GLUCOSE 107*  BUN 32*  CREATININE 1.29*  CALCIUM 9.0   ------------------------------------------------------------------------------------------------------------------  Cardiac Enzymes  Recent Labs Lab 10/02/16 2248  TROPONINI <0.03   ------------------------------------------------------------------------------------------------------------------  RADIOLOGY:  Dg Chest 2 View  Result Date: 10/03/2016 CLINICAL DATA:  75 year old female with cough and shortness of breath EXAM: CHEST  2 VIEW COMPARISON:  Chest radiograph dated 11/18/2015 FINDINGS: Left lung base opacity may represent atelectasis/scarring versus pneumonia. The right lung is clear. No significant pleural effusion. There is no pneumothorax. Stable top-normal cardiac size. Osteopenia with degenerative changes of the spine. Partially visualized old left humeral neck fracture. Right shoulder hemiarthroplasty. No acute fracture. IMPRESSION: Left lung base atelectasis versus infiltrate. Clinical correlation is recommended. Electronically Signed   By: Anner Crete M.D.   On: 10/03/2016 02:28    EKG:   Orders placed or performed during the hospital encounter of 10/03/16  . ED EKG within 10 minutes  . ED EKG within 10 minutes  . EKG 12-Lead  . EKG 12-Lead    ASSESSMENT AND PLAN:   This is a 75 year old female admitted for pneumonia.  1. Pneumonia: Presumably CAP.  She is received  ceftriaxone and azithromycin in  the emergency department. I'll continue oral Levaquin as the patient does not meet septic criteria.  2. Acute on chronic CHF:   Apparently the patient carries this diagnosis and had been on more diuretics than her current med list shows.   She also reports that she is 9 pounds above her dry weight. The patient has received Lasix 60 mg by mouth 2 doses and will continue Lasix 20 by mouth twice a day  Monitor urine output and kidney function.  Echocardiogram ordered  3. Acute kidney injury: Avoid nephrotoxic agents. Once patient is more euvolemic we will encourage oral intake.  4. COPD with acute bronchitis Steroid tapering  Continue inhaled corticosteroid and Spiriva. DuoNeb nebs as needed Levofloxacin   .5. Hypothyroidism: Continue Cytomel. Check TSH.  6. Abdominal wall hernia reducible outpatient follow-up with Gen. surgery as recommended after discharge  7. Right leg varicose vein rupture and bleeding well controlled with pressure dressing Continue close monitoring   6. DVT prophylaxis: heparin 7. GI prophylaxis: None   Physical therapy consult for deconditioning  All the records are reviewed and case discussed with Care Management/Social Workerr. Management plans discussed with the patient, family and they are in agreement.  CODE STATUS: fc  TOTAL TIME TAKING CARE OF THIS PATIENT: 36 minutes.   POSSIBLE D/C IN 1-2 DAYS, DEPENDING ON CLINICAL CONDITION.  Note: This dictation was prepared with Dragon dictation along with smaller phrase technology. Any transcriptional errors that result from this process are unintentional.   Nicholes Mango M.D on 10/03/2016 at 10:54 AM  Between 7am to 6pm - Pager - [phone] After 6pm go to [email or web address] - password [email or web address] [facility]  Office  [phone]  CC: Primary care physician; Talbert Cage, FNP

## 2016-10-03 NOTE — ED Notes (Signed)
Pt's cough easing up; cardiopulmonary into room to start pt on Bipap; spoke with Dr Dahlia Client who has said to hold off on bipap at this time

## 2016-10-03 NOTE — H&P (Signed)
Sara Pierce is an 75 y.o. female.   Chief Complaint: Cough HPI: The patient with past medical history of COPD and CHF presents to the emergency department complaining of cough and shortness of breath. She states that she been diagnosed with bronchitis recently and prescribed some antibiotics but they have not been helping. For the last few days she is become progressively more short of breath. She states that when she coughs she brings up frothy white sputum. She denies fevers. In the emergency department the patient was using accessory muscles and tachypneic, so BiPAP was initially started to improve her breathing. After a short period of time on noninvasive ventilation the patient was transitioned to nasal cannula. Chest x-ray read left lower lobe infiltrate versus atelectasis. The patient received multiple DuoNeb's as well as magnesium and steroids but continued to be tachypneic which prompted the emergency department staff to call for admission.  Past Medical History:  Diagnosis Date  . Arthritis   . Cardiomyopathy (HCC)   . Cataract   . Cerebral hemorrhage (HCC)   . Chronic kidney disease   . Clotting disorder (HCC)   . COPD (chronic obstructive pulmonary disease) (HCC)   . Depression   . Diabetes mellitus without complication (HCC)   . Hypertension   . Mixed incontinence   . Obesity   . Sleep apnea   . Thyroid disease   . Urinary frequency   . Vaginal atrophy   . Varicose veins     Past Surgical History:  Procedure Laterality Date  . APPENDECTOMY    . Cardiac Catherization    . COLONOSCOPY WITH PROPOFOL N/A 04/28/2016   Procedure: COLONOSCOPY WITH PROPOFOL;  Surgeon: Scot Jun, MD;  Location: [facility] ENDOSCOPY;  Service: Endoscopy;  Laterality: N/A;  . COLONOSCOPY WITH PROPOFOL N/A 04/29/2016   Procedure: COLONOSCOPY WITH PROPOFOL;  Surgeon: Scot Jun, MD;  Location: [facility] ENDOSCOPY;  Service: Endoscopy;  Laterality: N/A;  . JOINT REPLACEMENT    . STRABISMUS SURGERY     . TONSILLECTOMY    . TUBAL LIGATION      Family History  Problem Relation Age of Onset  . Heart attack Father   . Coronary artery disease Father   . Hypertension Father   . Hyperlipidemia Father   . Heart attack Mother   . Hypertension Mother   . Hyperlipidemia Mother    Social History:  reports that she has quit smoking. Her smoking use included Cigarettes. She has a 22.00 pack-year smoking history. She has never used smokeless tobacco. She reports that she does not drink alcohol or use drugs.  Allergies:  Allergies  Allergen Reactions  . Aspirin Swelling  . Codeine Itching  . Tape Hives    Medications Prior to Admission  Medication Sig Dispense Refill  . albuterol (PROVENTIL HFA;VENTOLIN HFA) 108 (90 BASE) MCG/ACT inhaler Inhale 2 puffs into the lungs every 6 (six) hours as needed for wheezing.     . benzonatate (TESSALON) 200 MG capsule Take 200 mg by mouth 3 (three) times daily as needed for cough.    Marland Kitchen buPROPion (WELLBUTRIN XL) 300 MG 24 hr tablet Take 300 mg by mouth daily.     . carvedilol (COREG) 6.25 MG tablet Take 6.25 mg by mouth 2 (two) times daily.     . clonazePAM (KLONOPIN) 0.5 MG tablet Take 1 tablet (0.5 mg total) by mouth 3 (three) times daily as needed (Anxiety). 20 tablet 0  . clotrimazole (LOTRIMIN) 1 % cream Apply 1 application topically  2 (two) times daily.    . clotrimazole-betamethasone (LOTRISONE) cream Apply 1 application topically 2 (two) times daily.    Marland Kitchen estradiol (ESTRACE) 0.1 MG/GM vaginal cream Place 1 Applicatorful vaginally 3 (three) times a week. Apply a pea-sized amount with tip of finger on Monday, Wednesday, and Friday night    . FLUoxetine (PROZAC) 40 MG capsule Take 40 mg by mouth daily.    . Fluticasone-Salmeterol (ADVAIR) 500-50 MCG/DOSE AEPB Inhale 1 puff into the lungs 2 (two) times daily.    . furosemide (LASIX) 20 MG tablet Take 1 tablet (20 mg total) by mouth daily. 30 tablet 0  . guaiFENesin (MUCINEX) 600 MG 12 hr tablet Take 1  tablet (600 mg total) by mouth 2 (two) times daily. 60 tablet 0  . hydrocortisone cream 1 % Apply 1 application topically 2 (two) times daily as needed for itching.    Marland Kitchen ipratropium-albuterol (DUONEB) 0.5-2.5 (3) MG/3ML SOLN Inhale 3 mLs into the lungs 4 (four) times daily as needed (wheezing).     Marland Kitchen liothyronine (CYTOMEL) 25 MCG tablet Take 25 mcg by mouth daily.     . magnesium oxide (MAG-OX) 400 MG tablet Take 400 mg by mouth daily.     . montelukast (SINGULAIR) 10 MG tablet Take 10 mg by mouth at bedtime.     Marland Kitchen nystatin (MYCOSTATIN/NYSTOP) 100000 UNIT/GM POWD Apply topically 2 (two) times daily as needed (rash). 1 application    . oxyCODONE-acetaminophen (ROXICET) 5-325 MG per tablet Take 1 tablet by mouth every 6 (six) hours as needed. 20 tablet 0  . potassium chloride (K-DUR) 10 MEQ tablet Take 10 mEq by mouth daily.     . predniSONE (STERAPRED UNI-PAK 21 TAB) 10 MG (21) TBPK tablet Take 1 tablet (10 mg total) by mouth daily. 5 tabs PO x 1 day 4 tabs PO x 1 day 3 tabs PO x 1 day 2 tabs PO x 1 day 1 tab PO x 1 day and stop 15 tablet 0  . primidone (MYSOLINE) 50 MG tablet Take 50 mg by mouth 2 (two) times daily.     . simvastatin (ZOCOR) 40 MG tablet Take 40 mg by mouth at bedtime.     . solifenacin (VESICARE) 10 MG tablet Take 10 mg by mouth daily.    Marland Kitchen spironolactone (ALDACTONE) 25 MG tablet Take 1 tablet (25 mg total) by mouth daily. 30 tablet 1  . tiotropium (SPIRIVA) 18 MCG inhalation capsule Place 18 mcg into inhaler and inhale daily.     . traZODone (DESYREL) 50 MG tablet Take 50 mg by mouth at bedtime as needed for sleep.    Marland Kitchen triamcinolone (KENALOG) 0.025 % cream Apply 1 application topically as needed (itchiness).    . Vitamin D, Ergocalciferol, (DRISDOL) 50000 UNITS CAPS capsule Take 50,000 Units by mouth every 30 (thirty) days. On the first of each month      Results for orders placed or performed during the hospital encounter of 10/03/16 (from the past 48 hour(s))  Basic  metabolic panel     Status: Abnormal   Collection Time: 10/02/16 10:48 PM  Result Value Ref Range   Sodium 135 135 - 145 mmol/L   Potassium 4.5 3.5 - 5.1 mmol/L   Chloride 99 (L) 101 - 111 mmol/L   CO2 28 22 - 32 mmol/L   Glucose, Bld 107 (H) 65 - 99 mg/dL   BUN 32 (H) 6 - 20 mg/dL   Creatinine, Ser 1.29 (H) 0.44 - 1.00 mg/dL  Calcium 9.0 8.9 - 10.3 mg/dL   GFR calc non Af Amer 40 (L) >60 mL/min   GFR calc Af Amer 46 (L) >60 mL/min    Comment: (NOTE) The eGFR has been calculated using the CKD EPI equation. This calculation has not been validated in all clinical situations. eGFR's persistently <60 mL/min signify possible Chronic Kidney Disease.    Anion gap 8 5 - 15  CBC     Status: Abnormal   Collection Time: 10/02/16 10:48 PM  Result Value Ref Range   WBC 10.7 3.6 - 11.0 K/uL   RBC 3.44 (L) 3.80 - 5.20 MIL/uL   Hemoglobin 11.0 (L) 12.0 - 16.0 g/dL   HCT 32.1 (L) 35.0 - 47.0 %   MCV 93.3 80.0 - 100.0 fL   MCH 31.9 26.0 - 34.0 pg   MCHC 34.2 32.0 - 36.0 g/dL   RDW 14.2 11.5 - 14.5 %   Platelets 223 150 - 440 K/uL  Troponin I     Status: None   Collection Time: 10/02/16 10:48 PM  Result Value Ref Range   Troponin I <0.03 <0.03 ng/mL  Brain natriuretic peptide     Status: Abnormal   Collection Time: 10/02/16 10:48 PM  Result Value Ref Range   B Natriuretic Peptide 230.0 (H) 0.0 - 100.0 pg/mL  TSH     Status: None   Collection Time: 10/02/16 10:48 PM  Result Value Ref Range   TSH 1.008 0.350 - 4.500 uIU/mL    Comment: Performed by a 3rd Generation assay with a functional sensitivity of <=0.01 uIU/mL.  Blood gas, venous     Status: Abnormal (Preliminary result)   Collection Time: 10/03/16  2:20 AM  Result Value Ref Range   FIO2 PENDING    Delivery systems PENDING    pH, Ven 7.37 7.250 - 7.430   pCO2, Ven 52 44.0 - 60.0 mmHg   pO2, Ven 32.0 32.0 - 45.0 mmHg   Bicarbonate 30.1 (H) 20.0 - 28.0 mmol/L   Acid-Base Excess 3.6 (H) 0.0 - 2.0 mmol/L   O2 Saturation 59.4 %    Patient temperature 37.0    Collection site VEIN    Sample type VEIN    Dg Chest 2 View  Result Date: 10/03/2016 CLINICAL DATA:  75 year old female with cough and shortness of breath EXAM: CHEST  2 VIEW COMPARISON:  Chest radiograph dated 11/18/2015 FINDINGS: Left lung base opacity may represent atelectasis/scarring versus pneumonia. The right lung is clear. No significant pleural effusion. There is no pneumothorax. Stable top-normal cardiac size. Osteopenia with degenerative changes of the spine. Partially visualized old left humeral neck fracture. Right shoulder hemiarthroplasty. No acute fracture. IMPRESSION: Left lung base atelectasis versus infiltrate. Clinical correlation is recommended. Electronically Signed   By: Anner Crete M.D.   On: 10/03/2016 02:28    Review of Systems  Constitutional: Negative for chills and fever.  HENT: Negative for sore throat and tinnitus.   Eyes: Negative for blurred vision and redness.  Respiratory: Positive for cough, sputum production, shortness of breath and wheezing.   Cardiovascular: Negative for chest pain, palpitations, orthopnea and PND.  Gastrointestinal: Negative for abdominal pain, diarrhea, nausea and vomiting.  Genitourinary: Negative for dysuria, frequency and urgency.  Musculoskeletal: Negative for joint pain and myalgias.  Skin: Negative for rash.       No lesions  Neurological: Negative for speech change, focal weakness and weakness.  Endo/Heme/Allergies: Does not bruise/bleed easily.       No temperature intolerance  Psychiatric/Behavioral:  Negative for depression and suicidal ideas.    Blood pressure (!) 109/40, pulse 69, temperature 98.3 F (36.8 C), temperature source Oral, resp. rate 18, height '5\' 5"'$  (1.651 m), weight 113.4 kg (250 lb), SpO2 94 %. Physical Exam  Vitals reviewed. Constitutional: She is oriented to person, place, and time. She appears well-developed and well-nourished. No distress.  HENT:  Head:  Normocephalic and atraumatic.  Mouth/Throat: Oropharynx is clear and moist.  Eyes: Conjunctivae and EOM are normal. Pupils are equal, round, and reactive to light. No scleral icterus.  Neck: Normal range of motion. Neck supple. No JVD present. No tracheal deviation present. No thyromegaly present.  Cardiovascular: Normal rate, regular rhythm and normal heart sounds.  Exam reveals no gallop and no friction rub.   No murmur heard. Respiratory: Effort normal. Tachypnea noted. She has wheezes in the right lower field. She has no rhonchi. She has no rales.  GI: Soft. Bowel sounds are normal. She exhibits no distension. There is no tenderness.  Genitourinary:  Genitourinary Comments: Deferred  Musculoskeletal: Normal range of motion. She exhibits edema.  Wound medial right lower extremity  Lymphadenopathy:    She has no cervical adenopathy.  Neurological: She is alert and oriented to person, place, and time. No cranial nerve deficit. She exhibits normal muscle tone.  Skin: Skin is warm and dry. No rash noted. No erythema.  Psychiatric: She has a normal mood and affect. Her behavior is normal. Judgment and thought content normal.     Assessment/Plan This is a 75 year old female admitted for pneumonia. 1. Pneumonia: Presumably CAP. She is received ceftriaxone and azithromycin in the emergency department. I'll continue oral Levaquin as the patient does not meet septic criteria. Notably, the patient states that she has CHF although this was not originally on her medical history list, nor do we have an echocardiogram on file. In my opinion, her chest x-ray does not clearly show infiltrate on the left side but rather increased lung markings on a potentially edematous background which is more consistent with CHF than pneumonia. 2. CHF: Apparently the patient carries this diagnosis and had been on more diuretics than her current med list shows. Her frothy white sputum and Cecille Rubin she may edema is consistent  with this diagnosis. She also reports that she is 9 pounds above her dry weight. I will increase her Lasix dose for today and then resume Lasix and spironolactone per home regimen. Monitor urine output and kidney function. Echocardiogram at discretion of primary team. 3. Acute kidney injury: Avoid nephrotoxic agents. Once patient is more euvolemic we will encourage oral intake. 4. COPD: Continue inhaled corticosteroid and Spiriva. DuoNeb nebs as needed. I also placed the patient on a steroid taper given her history of bronchitis. 5. Hypothyroidism: Continue Cytomel. Check TSH. 6. DVT prophylaxis: heparin 7. GI prophylaxis: None The patient is a full code. Time spent on admission orders and patient care approximately 45 minutes  Harrie Foreman, MD 10/03/2016, 7:22 AM

## 2016-10-03 NOTE — ED Notes (Signed)
1st set of blood cultures drawn from right hand; pt tolerated well

## 2016-10-03 NOTE — Progress Notes (Signed)
PT Cancellation Note  Patient Details Name: Sara Pierce MRN: [id number] DOB: 1941-09-07   Cancelled Treatment:    Reason Eval/Treat Not Completed: Patient at procedure or test/unavailable Attempted X 3 earlier today with pt either with guests and requesting PT to come back or needing to either go to the bathroom or needing a clean up.  On 4th attempt pt was out of the room at echo, will try back with PT eval tomorrow.  Kreg Shropshire, DPT 10/03/2016, 3:11 PM

## 2016-10-03 NOTE — ED Notes (Signed)
Pt given ice chips

## 2016-10-03 NOTE — Care Management Obs Status (Signed)
[place] NOTIFICATION   Patient Details  Name: Sara Pierce MRN: [id number] Date of Birth: 1941-04-06   Medicare Observation Status Notification Given:  Yes    Carles Collet, RN 10/03/2016, 11:12 AM

## 2016-10-03 NOTE — Progress Notes (Signed)
Notified Dr Jannifer Franklin of pt's low blood pressure and fluids infusing at 150 cc/hr. Received order to hold coreg for this evening. Received order to d/c ivf and lasix.

## 2016-10-03 NOTE — ED Notes (Signed)
Patient transported to X-ray 

## 2016-10-03 NOTE — ED Notes (Signed)
Pt back from x-ray.

## 2016-10-03 NOTE — ED Provider Notes (Signed)
[facility] Emergency Department Provider Note   ____________________________________________   First MD Initiated Contact with Patient 10/03/16 0155     (approximate)  I have reviewed the triage vital signs and the nursing notes.   HISTORY  Chief Complaint Laceration; Cough; and Shortness of Breath    HPI Sara Pierce is a 75 y.o. female comes into the hospital today with shortness of breath. She reports that her symptoms started a week ago. She reports that she did go to the doctor but they haven't done anything for her. According to the patient she was taken off of her Lasix 3 times a day and put on a pill once a day. The patient reports that she has continued to have some leg swelling. She reports that she's had a cough with chest tightness. Her cough is productive of white foam. The patient denies any fevers and reports that her inhalers are not helping at home. She reports that she cannot stop coughing. She has had some posttussive emesis. She also reports that tonight she had a bleeding varicose vein on her leg that had been cared for previously. The patient is on oxygen typically at night and is currently on 3 L of O2 by nasal cannula. The patient is here for evaluation of these symptoms.   Past Medical History:  Diagnosis Date  . Arthritis   . Cardiomyopathy ([place])   . Cataract   . Cerebral hemorrhage ([place])   . Chronic kidney disease   . Clotting disorder ([place])   . COPD (chronic obstructive pulmonary disease) ([place])   . Depression   . Diabetes mellitus without complication ([place])   . Hypertension   . Mixed incontinence   . Obesity   . Sleep apnea   . Thyroid disease   . Urinary frequency   . Vaginal atrophy   . Varicose veins     Patient Active Problem List   Diagnosis Date Noted  . Sepsis ([place]) 11/15/2015  . Community acquired pneumonia 11/15/2015  . Absolute anemia 03/15/2015  . Anxiety 03/15/2015  . Arthritis 03/15/2015  .  Cardiomyopathy, secondary ([place]) 03/15/2015  . Cataract 03/15/2015  . Brain bleed ([place]) 03/15/2015  . Chronic constipation 03/15/2015  . Chronic kidney failure 03/15/2015  . Blood clotting disorder ([place]) 03/15/2015  . CAFL (chronic airflow limitation) ([place]) 03/15/2015  . Clinical depression 03/15/2015  . Diabetes ([place]) 03/15/2015  . H/O varicella 03/15/2015  . BP (high blood pressure) 03/15/2015  . Mixed incontinence 03/15/2015  . 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 Adiposity 03/15/2015  . Apnea, sleep 03/15/2015  . Disease of thyroid gland 03/15/2015  . FOM (frequency of micturition) 03/15/2015  . 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 Candida vaginitis 03/15/2015  . Abnormal ECG 03/15/2015    Past Surgical History:  Procedure Laterality Date  . APPENDECTOMY    . Cardiac Catherization    . COLONOSCOPY WITH PROPOFOL N/A 04/28/2016   Procedure: COLONOSCOPY WITH PROPOFOL;  Surgeon: Manya Silvas, MD;  Location: [facility] ENDOSCOPY;  Service: Endoscopy;  Laterality: N/A;  . COLONOSCOPY WITH PROPOFOL N/A 04/29/2016   Procedure: COLONOSCOPY WITH PROPOFOL;  Surgeon: Manya Silvas, MD;  Location: [facility] ENDOSCOPY;  Service: Endoscopy;  Laterality: N/A;  . JOINT REPLACEMENT    . STRABISMUS SURGERY    . TONSILLECTOMY    . TUBAL LIGATION      Prior to Admission medications   Medication Sig Start Date End Date Taking? Authorizing Provider  albuterol (PROVENTIL HFA;VENTOLIN HFA) 108 (90 BASE) MCG/ACT inhaler Inhale 2 puffs into the lungs every 6 (six) hours as needed for wheezing.     Historical Provider, MD  benzonatate (TESSALON) 200 MG capsule Take 200 mg by mouth 3 (three) times daily as needed for cough.    Historical Provider, MD  buPROPion (WELLBUTRIN XL) 300 MG 24 hr tablet Take 300 mg by mouth daily.     Historical Provider, MD  carvedilol (COREG) 6.25 MG tablet Take 6.25 mg by mouth 2 (two) times daily.     Historical Provider, MD  clonazePAM  (KLONOPIN) 0.5 MG tablet Take 1 tablet (0.5 mg total) by mouth 3 (three) times daily as needed (Anxiety). 11/19/15   Gladstone Lighter, MD  clotrimazole (LOTRIMIN) 1 % cream Apply 1 application topically 2 (two) times daily.    Historical Provider, MD  clotrimazole-betamethasone (LOTRISONE) cream Apply 1 application topically 2 (two) times daily.    Historical Provider, MD  estradiol (ESTRACE) 0.1 MG/GM vaginal cream Place 1 Applicatorful vaginally 3 (three) times a week. Apply a pea-sized amount with tip of finger on Monday, Wednesday, and Friday night    Historical Provider, MD  FLUoxetine (PROZAC) 40 MG capsule Take 40 mg by mouth daily.    Historical Provider, MD  Fluticasone-Salmeterol (ADVAIR) 500-50 MCG/DOSE AEPB Inhale 1 puff into the lungs 2 (two) times daily.    Historical Provider, MD  furosemide (LASIX) 20 MG tablet Take 1 tablet (20 mg total) by mouth daily. 11/19/15   Gladstone Lighter, MD  guaiFENesin (MUCINEX) 600 MG 12 hr tablet Take 1 tablet (600 mg total) by mouth 2 (two) times daily. 11/19/15   Gladstone Lighter, MD  hydrocortisone cream 1 % Apply 1 application topically 2 (two) times daily as needed for itching.    Historical Provider, MD  ipratropium-albuterol (DUONEB) 0.5-2.5 (3) MG/3ML SOLN Inhale 3 mLs into the lungs 4 (four) times daily as needed (wheezing).     Historical Provider, MD  liothyronine (CYTOMEL) 25 MCG tablet Take 25 mcg by mouth daily.     Historical Provider, MD  magnesium oxide (MAG-OX) 400 MG tablet Take 400 mg by mouth daily.     Historical Provider, MD  montelukast (SINGULAIR) 10 MG tablet Take 10 mg by mouth at bedtime.     Historical Provider, MD  nystatin (MYCOSTATIN/NYSTOP) 100000 UNIT/GM POWD Apply topically 2 (two) times daily as needed (rash). 1 application    Historical Provider, MD  oxyCODONE-acetaminophen (ROXICET) 5-325 MG per tablet Take 1 tablet by mouth every 6 (six) hours as needed. 06/05/15   Earleen Newport, MD  potassium chloride (K-DUR)  10 MEQ tablet Take 10 mEq by mouth daily.     Historical Provider, MD  predniSONE (STERAPRED UNI-PAK 21 TAB) 10 MG (21) TBPK tablet Take 1 tablet (10 mg total) by mouth daily. 5 tabs PO x 1 day 4 tabs PO x 1 day 3 tabs PO x 1 day 2 tabs PO x 1 day 1 tab PO x 1 day and stop 11/19/15   Gladstone Lighter, MD  primidone (MYSOLINE) 50 MG tablet Take 50 mg by mouth 2 (two) times daily.     Historical Provider, MD  simvastatin (ZOCOR) 40 MG tablet Take 40 mg by mouth at bedtime.     Historical Provider, MD  solifenacin (VESICARE) 10 MG tablet Take 10 mg by mouth daily.    Historical Provider, MD  spironolactone (ALDACTONE) 25 MG tablet Take 1 tablet (25 mg total) by mouth daily. 11/19/15  Gladstone Lighter, MD  tiotropium (SPIRIVA) 18 MCG inhalation capsule Place 18 mcg into inhaler and inhale daily.     Historical Provider, MD  traZODone (DESYREL) 50 MG tablet Take 50 mg by mouth at bedtime as needed for sleep.    Historical Provider, MD  triamcinolone (KENALOG) 0.025 % cream Apply 1 application topically as needed (itchiness).    Historical Provider, MD  Vitamin D, Ergocalciferol, (DRISDOL) 50000 UNITS CAPS capsule Take 50,000 Units by mouth every 30 (thirty) days. On the first of each month    Historical Provider, MD    Allergies Aspirin; Codeine; Naproxen; and Tape  Family History  Problem Relation Age of Onset  . Heart attack Father   . Coronary artery disease Father   . Hypertension Father   . Hyperlipidemia Father   . Heart attack Mother   . Hypertension Mother   . Hyperlipidemia Mother     Social History Social History  Substance Use Topics  . Smoking status: Former Smoker    Packs/day: 0.50    Years: 44.00    Types: Cigarettes  . Smokeless tobacco: Never Used  . Alcohol use No    Review of Systems Constitutional: No fever/chills Eyes: No visual changes. ENT: No sore throat. Cardiovascular: Chest tightness Respiratory: Cough and shortness of breath. Gastrointestinal: No  abdominal pain.  No nausea, no vomiting.  No diarrhea.  No constipation. Genitourinary: Negative for dysuria. Musculoskeletal: Negative for back pain. Skin: Negative for rash. Neurological: Negative for headaches, focal weakness or numbness.  10-point ROS otherwise negative.  ____________________________________________   PHYSICAL EXAM:  VITAL SIGNS: ED Triage Vitals [10/02/16 2233]  Enc Vitals Group     BP 126/61     Pulse Rate 69     Resp 18     Temp 98.1 F (36.7 C)     Temp Source Oral     SpO2 99 %     Weight 250 lb (113.4 kg)     Height 5\' 5"  (1.651 m)     Head Circumference      Peak Flow      Pain Score      Pain Loc      Pain Edu?      Excl. in [place]?     Constitutional: Alert and oriented. Well appearing and in Moderate distress. Eyes: Conjunctivae are normal. PERRL. EOMI. Head: Atraumatic. Nose: No congestion/rhinnorhea. Mouth/Throat: Mucous membranes are moist.  Oropharynx non-erythematous. Cardiovascular: Normal rate, regular rhythm. Grossly normal heart sounds.  Good peripheral circulation. Respiratory: Increased respiratory effort.  No retractions. Wheezing in all lung fields Gastrointestinal: Soft and nontender. No distention. Positive bowel sounds Musculoskeletal: No lower extremity tenderness nor edema.   Neurologic:  Normal speech and language. Skin:  Skin is warm, dry and intact.  Psychiatric: Mood and affect are normal.   ____________________________________________   LABS (all labs ordered are listed, but only abnormal results are displayed)  Labs Reviewed  BASIC METABOLIC PANEL - Abnormal; Notable for the following:       Result Value   Chloride 99 (*)    Glucose, Bld 107 (*)    BUN 32 (*)    Creatinine, Ser 1.29 (*)    GFR calc non Af Amer 40 (*)    GFR calc Af Amer 46 (*)    All other components within normal limits  CBC - Abnormal; Notable for the following:    RBC 3.44 (*)    Hemoglobin 11.0 (*)    HCT 32.1 (*)  All other  components within normal limits  BRAIN NATRIURETIC PEPTIDE - Abnormal; Notable for the following:    B Natriuretic Peptide 230.0 (*)    All other components within normal limits  BLOOD GAS, VENOUS - Abnormal; Notable for the following:    Bicarbonate 30.1 (*)    Acid-Base Excess 3.6 (*)    All other components within normal limits  CULTURE, BLOOD (ROUTINE X 2)  CULTURE, BLOOD (ROUTINE X 2)  TROPONIN I   ____________________________________________  EKG  ED ECG REPORT I, Loney Hering, the attending physician, personally viewed and interpreted this ECG.   Date: 10/02/2016  EKG Time: 2250  Rate: 69  Rhythm: normal sinus rhythm  Axis: normal  Intervals:right bundle branch block  ST&T Change: Flipped T waves in leads 3, V2, V3, V1.  ____________________________________________  RADIOLOGY  CXR ____________________________________________   PROCEDURES  Procedure(s) performed: None  Procedures  Critical Care performed: No  ____________________________________________   INITIAL IMPRESSION / ASSESSMENT AND PLAN / ED COURSE  Pertinent labs & imaging results that were available during my care of the patient were reviewed by me and considered in my medical decision making (see chart for details).  The patient is a 75 year old female who comes into the hospital today with some shortness of breath, cough and chest tightness. I will give the patient some duo nebs as well as Solu-Medrol. I will check a VBG along with the other blood work is been drawn and the patient will be reevaluated when she's received the results of all of her blood work. The patient will also receive a chest x-ray.  Clinical Course  Value Comment By Time  DG Chest 2 View Left lung base atelectasis versus infiltrate. Clinical correlation is recommended.   Loney Hering, MD 10/22 0246   The patient is still having some coughing and wheezing. I ordered an albuterol neb for her and some  azithromycin. It looks that the patient has a possible infiltrate on her chest x-ray. I will treat the patient with some ceftriaxone as well. Even after the fourth neb the patient continued having some coughing fits and wheezing. I will admit the patient to the hospital for bronchospasm and pneumonia. I will give the patient some magnesium sulfate. I will consider some BiPAP the patient continues to have wheezing and shortness of breath.  ____________________________________________   FINAL CLINICAL IMPRESSION(S) / ED DIAGNOSES  Final diagnoses:  Community acquired pneumonia of left lower lobe of lung (HCC)  Bronchospasm  Cough      NEW MEDICATIONS STARTED DURING THIS VISIT:  New Prescriptions   No medications on file     Note:  This document was prepared using Dragon voice recognition software and may include unintentional dictation errors.    Loney Hering, MD 10/03/16 [phone]

## 2016-10-03 NOTE — ED Notes (Addendum)
Pt assist x 2 onto stretcher without incident; pt says she came to the ED tonight after a varicose vein near her right ankle ruptured; dressing had been applied by first responders; no drainage noted on dressing; pt adds while here she would like to be assessed for other medical problems; c/o productive cough for 5 days of white or green sputum, wheezing, increased shortness of breath, 5-8 lb weight gain with peripheral edema; feet cleaned of dried blood;

## 2016-10-03 NOTE — Progress Notes (Signed)
*  PRELIMINARY RESULTS* Echocardiogram 2D Echocardiogram has been performed. Very Limited Echo due to patient constant coughing, very difficult study to complete.  Sara Pierce 10/03/2016, 3:16 PM

## 2016-10-04 LAB — BASIC METABOLIC PANEL
ANION GAP: 3 — AB (ref 5–15)
BUN: 28 mg/dL — ABNORMAL HIGH (ref 6–20)
CHLORIDE: 102 mmol/L (ref 101–111)
CO2: 30 mmol/L (ref 22–32)
Calcium: 8.3 mg/dL — ABNORMAL LOW (ref 8.9–10.3)
Creatinine, Ser: 1.01 mg/dL — ABNORMAL HIGH (ref 0.44–1.00)
GFR calc Af Amer: >60 mL/min (ref >60)
GFR, EST NON AFRICAN AMERICAN: 54 mL/min — AB (ref >60)
GLUCOSE: 147 mg/dL — AB (ref 65–99)
POTASSIUM: 4.2 mmol/L (ref 3.5–5.1)
Sodium: 135 mmol/L (ref 135–145)

## 2016-10-04 LAB — CBC
HEMATOCRIT: 26.4 % — AB (ref 35.0–47.0)
HEMOGLOBIN: 9.2 g/dL — AB (ref 12.0–16.0)
MCH: 32.4 pg (ref 26.0–34.0)
MCHC: 34.8 g/dL (ref 32.0–36.0)
MCV: 93.2 fL (ref 80.0–100.0)
Platelets: 187 10*3/uL (ref 150–440)
RBC: 2.84 MIL/uL — ABNORMAL LOW (ref 3.80–5.20)
RDW: 14 % (ref 11.5–14.5)
WBC: 7.9 10*3/uL (ref 3.6–11.0)

## 2016-10-04 LAB — URINALYSIS COMPLETE WITH MICROSCOPIC (ARMC ONLY)
BILIRUBIN URINE: NEGATIVE
GLUCOSE, UA: NEGATIVE mg/dL
Hgb urine dipstick: NEGATIVE
KETONES UR: NEGATIVE mg/dL
Leukocytes, UA: NEGATIVE
NITRITE: NEGATIVE
Protein, ur: NEGATIVE mg/dL
RBC / HPF: NONE SEEN RBC/hpf (ref 0–5)
SPECIFIC GRAVITY, URINE: 1.005 (ref 1.005–1.030)
pH: 6 (ref 5.0–8.0)

## 2016-10-04 LAB — TSH: TSH: 0.316 u[IU]/mL — AB (ref 0.350–4.500)

## 2016-10-04 LAB — HEMOGLOBIN A1C
Hgb A1c MFr Bld: 5.3 % (ref 4.8–5.6)
Mean Plasma Glucose: 105 mg/dL

## 2016-10-04 LAB — ECHOCARDIOGRAM COMPLETE

## 2016-10-04 MED ORDER — LEVOFLOXACIN 500 MG PO TABS: 500.0000 mg | ORAL_TABLET | Freq: Every day | ORAL | 0 refills | Status: DC

## 2016-10-04 MED ORDER — LEVOFLOXACIN 500 MG PO TABS: 500.0000 mg | ORAL_TABLET | Freq: Every day | ORAL | Status: DC

## 2016-10-04 MED ORDER — ENOXAPARIN SODIUM 40 MG/0.4ML ~~LOC~~ SOLN: 40.0000 mg | Freq: Two times a day (BID) | SUBCUTANEOUS | Status: DC

## 2016-10-04 MED ORDER — SALINE SPRAY 0.65 % NA SOLN: 2.0000 | Freq: Three times a day (TID) | NASAL | 0 refills | Status: DC | PRN

## 2016-10-04 MED ORDER — GUAIFENESIN-DM 100-10 MG/5ML PO SYRP: 10.0000 mL | ORAL_SOLUTION | Freq: Four times a day (QID) | ORAL | 0 refills | Status: DC | PRN

## 2016-10-04 MED ORDER — PREDNISONE 10 MG (21) PO TBPK: 10.0000 mg | ORAL_TABLET | Freq: Every day | ORAL | 0 refills | Status: DC

## 2016-10-04 MED ORDER — TORSEMIDE 20 MG PO TABS: 20.0000 mg | ORAL_TABLET | Freq: Two times a day (BID) | ORAL | 0 refills | Status: DC

## 2016-10-04 MED ORDER — DOCUSATE SODIUM 100 MG PO CAPS: 100.0000 mg | ORAL_CAPSULE | Freq: Two times a day (BID) | ORAL | 0 refills | Status: DC | PRN

## 2016-10-04 NOTE — Progress Notes (Signed)
Discharge instructions explained to pt./ verbalized an understanding/ iv and tele removed/ rx given to pt/ [facility] Sara Pierce arrived to transport pt home.

## 2016-10-04 NOTE — Discharge Instructions (Signed)
Activity as recommended by physical therapy at [facility]-up with [facility] in [facility] days Continue 3 L of home oxygen with humidifier Primary care provider to repeat thyroid function panel in 4-6 weeks as TSH is below normal and also to follow urine culture which is pending at this time of discharge

## 2016-10-04 NOTE — Discharge Summary (Signed)
[facility] NAME: Sara Pierce    MR#:  [id number]  DATE OF BIRTH:  1941/08/23  DATE OF ADMISSION:  10/03/2016 ADMITTING PHYSICIAN: Harrie Foreman, MD  DATE OF DISCHARGE: 10/04/16  PRIMARY CARE PHYSICIAN: Talbert Cage, FNP    ADMISSION DIAGNOSIS:  Cough [R05] Bronchospasm [J98.01] Community acquired pneumonia of left lower lobe of lung ([place]) [J18.1]  DISCHARGE DIAGNOSIS:  Active Problems:   CAP (community acquired pneumonia)  Acute on chronic diastolic congestive heart failure SECONDARY DIAGNOSIS:   Past Medical History:  Diagnosis Date  . Arthritis   . Cardiomyopathy ([place])   . Cataract   . Cerebral hemorrhage ([place])   . Chronic kidney disease   . Clotting disorder ([place])   . COPD (chronic obstructive pulmonary disease) ([place])   . Depression   . Diabetes mellitus without complication ([place])   . Hypertension   . Mixed incontinence   . Obesity   . Sleep apnea   . Thyroid disease   . Urinary frequency   . Vaginal atrophy   . Varicose veins     HOSPITAL COURSE:   This is a 75 year old female admitted for pneumonia.  1. Pneumonia: Presumably CAP.  She is received ceftriaxone and azithromycin in the emergency department. I'll continue oral Levaquin as the patient does not meet septic criteria.Discharge home with by mouth levofloxacin Urine cultures are pending to be followed by the primary care provider  2. Acute on chronicDiastolic CHF:  Apparently the patient carries this diagnosis and had been on more diuretics than her current med list shows.   She also reports that she is 9 pounds above her dry weight. The patient has received Lasix 60 mg by mouth 2 doses and received Lasix 20 by mouth twice a day, will change to torsemide 20 mg by mouth twice a day as per my discussion with patient's primary care Ms. Maren Reamer NP . Continue potassium supplements  Monitor urine output and kidney function.   Echocardiogram  ejection fraction 70%  3. Acute kidney injury: Avoid nephrotoxic agents. Once patient is more euvolemic we will encourage oral intake. Creatinine is better at 1.01   4. COPD with acute bronchitis Steroid tapering  Continue inhaled corticosteroid and Spiriva. DuoNeb nebs as needed Levofloxacin   .5. Hypothyroidism: Continue Cytomel.  TSH Is below normal. PCP to consider repeating TSH and a thyroid function labs in 4-6 weeks  6. Abdominal wall hernia reducible outpatient follow-up with Gen. surgery as recommended after discharge  7. Right leg varicose vein rupture and bleeding well controlled with pressure dressing Continue close monitoring   6. DVT prophylaxis: heparin 7. GI prophylaxis: None   Physical therapy recommended skilled nursing care. Patient prefers going [facility] with [facility]. Social worker has discussed with the patient  DISCHARGE CONDITIONS:   fair  CONSULTS OBTAINED:  Treatment Team:  Nicholes Mango, MD   PROCEDURES none  DRUG ALLERGIES:   Allergies  Allergen Reactions  . Aspirin Swelling  . Codeine Itching  . Tape Hives    DISCHARGE MEDICATIONS:   Current Discharge Medication List    START taking these medications   Details  docusate sodium (COLACE) 100 MG capsule Take 1 capsule (100 mg total) by mouth 2 (two) times daily as needed for mild constipation. Qty: 10 capsule, Refills: 0    guaiFENesin-dextromethorphan (ROBITUSSIN DM) 100-10 MG/5ML syrup Take 10 mLs by mouth every 6 (six) hours as needed for cough. Qty: 118  mL, Refills: 0    levofloxacin (LEVAQUIN) 500 MG tablet Take 1 tablet (500 mg total) by mouth daily. Qty: 5 tablet, Refills: 0    sodium chloride (OCEAN) 0.65 % SOLN nasal spray Place 2 sprays into both nostrils 3 (three) times daily as needed for congestion. Qty: 1 Bottle, Refills: 0    torsemide (DEMADEX) 20 MG tablet Take 1 tablet (20 mg total) by mouth 2 (two) times daily. Qty: 60 tablet,  Refills: 0      CONTINUE these medications which have CHANGED   Details  predniSONE (STERAPRED UNI-PAK 21 TAB) 10 MG (21) TBPK tablet Take 1 tablet (10 mg total) by mouth daily. 5 tabs PO x 1 day 4 tabs PO x 1 day 3 tabs PO x 1 day 2 tabs PO x 1 day 1 tab PO x 1 day and stop Qty: 15 tablet, Refills: 0      CONTINUE these medications which have NOT CHANGED   Details  albuterol (PROVENTIL HFA;VENTOLIN HFA) 108 (90 BASE) MCG/ACT inhaler Inhale 2 puffs into the lungs every 6 (six) hours as needed for wheezing.     benzonatate (TESSALON) 200 MG capsule Take 200 mg by mouth 3 (three) times daily as needed for cough.    buPROPion (WELLBUTRIN XL) 300 MG 24 hr tablet Take 300 mg by mouth daily.     carvedilol (COREG) 6.25 MG tablet Take 6.25 mg by mouth 2 (two) times daily.     clonazePAM (KLONOPIN) 0.5 MG tablet Take 1 tablet (0.5 mg total) by mouth 3 (three) times daily as needed (Anxiety). Qty: 20 tablet, Refills: 0    clotrimazole (LOTRIMIN) 1 % cream Apply 1 application topically 2 (two) times daily.    clotrimazole-betamethasone (LOTRISONE) cream Apply 1 application topically 2 (two) times daily.    estradiol (ESTRACE) 0.1 MG/GM vaginal cream Place 1 Applicatorful vaginally 3 (three) times a week. Apply a pea-sized amount with tip of finger on Monday, Wednesday, and Friday night    FLUoxetine (PROZAC) 40 MG capsule Take 40 mg by mouth daily.    Fluticasone-Salmeterol (ADVAIR) 500-50 MCG/DOSE AEPB Inhale 1 puff into the lungs 2 (two) times daily.    hydrocortisone cream 1 % Apply 1 application topically 2 (two) times daily as needed for itching.    ipratropium-albuterol (DUONEB) 0.5-2.5 (3) MG/3ML SOLN Inhale 3 mLs into the lungs 4 (four) times daily as needed (wheezing).     liothyronine (CYTOMEL) 25 MCG tablet Take 25 mcg by mouth daily.     magnesium oxide (MAG-OX) 400 MG tablet Take 400 mg by mouth daily.     montelukast (SINGULAIR) 10 MG tablet Take 10 mg by mouth at  bedtime.     nystatin (MYCOSTATIN/NYSTOP) 100000 UNIT/GM POWD Apply topically 2 (two) times daily as needed (rash). 1 application    oxyCODONE-acetaminophen (ROXICET) 5-325 MG per tablet Take 1 tablet by mouth every 6 (six) hours as needed. Qty: 20 tablet, Refills: 0    potassium chloride (K-DUR) 10 MEQ tablet Take 10 mEq by mouth daily.     primidone (MYSOLINE) 50 MG tablet Take 50 mg by mouth 2 (two) times daily.     simvastatin (ZOCOR) 40 MG tablet Take 40 mg by mouth at bedtime.     solifenacin (VESICARE) 10 MG tablet Take 10 mg by mouth daily.    spironolactone (ALDACTONE) 25 MG tablet Take 1 tablet (25 mg total) by mouth daily. Qty: 30 tablet, Refills: 1    tiotropium (SPIRIVA) 18 MCG inhalation capsule  Place 18 mcg into inhaler and inhale daily.     traZODone (DESYREL) 50 MG tablet Take 50 mg by mouth at bedtime as needed for sleep.    triamcinolone (KENALOG) 0.025 % cream Apply 1 application topically as needed (itchiness).    Vitamin D, Ergocalciferol, (DRISDOL) 50000 UNITS CAPS capsule Take 50,000 Units by mouth every 30 (thirty) days. On the first of each month      STOP taking these medications     furosemide (LASIX) 20 MG tablet      guaiFENesin (MUCINEX) 600 MG 12 hr tablet          DISCHARGE INSTRUCTIONS:  Activity as recommended by [facility] Follow-up with primary care provider at [facility] in 2 days Continue 3 L of home oxygen with humidifier Primary care provider to repeat thyroid function panel in 4-6 weeks as TSH is below normal and also to follow urine culture which is pending at this time of discharge    DIET:  Cardiac diet  DISCHARGE CONDITION:  Fair  ACTIVITY:  Activity as tolerated  OXYGEN:  Home Oxygen: Yes.     Oxygen Delivery: 3 liters/min via Patient connected to nasal cannula oxygen  DISCHARGE LOCATION:  home   If you experience worsening of your admission symptoms, develop shortness of breath, life  threatening emergency, suicidal or homicidal thoughts you must seek medical attention immediately by calling 911 or calling your MD immediately  if symptoms less severe.  You Must read complete instructions/literature along with all the possible adverse reactions/side effects for all the Medicines you take and that have been prescribed to you. Take any new Medicines after you have completely understood and accpet all the possible adverse reactions/side effects.   Please note  You were cared for by a hospitalist during your hospital stay. If you have any questions about your discharge medications or the care you received while you were in the hospital after you are discharged, you can call the unit and asked to speak with the hospitalist on call if the hospitalist that took care of you is not available. Once you are discharged, your primary care physician will handle any further medical issues. Please note that NO REFILLS for any discharge medications will be authorized once you are discharged, as it is imperative that you return to your primary care physician (or establish a relationship with a primary care physician if you do not have one) for your aftercare needs so that they can reassess your need for medications and monitor your lab values.     Today  Chief Complaint  Patient presents with  . Laceration  . Cough  . Shortness of Breath   Patient is doing fine. Wants to go  home to continue physical therapy through [facility]  ROS:  CONSTITUTIONAL: Denies fevers, chills. Denies any fatigue, weakness.  EYES: Denies blurry vision, double vision, eye pain. EARS, NOSE, THROAT: Denies tinnitus, ear pain, hearing loss. RESPIRATORY: Denies cough, wheeze, shortness of breath.  CARDIOVASCULAR: Denies chest pain, palpitations, edema.  GASTROINTESTINAL: Denies nausea, vomiting, diarrhea, abdominal pain. Denies bright red blood per rectum. GENITOURINARY: Denies dysuria, hematuria. ENDOCRINE:  Denies nocturia or thyroid problems. HEMATOLOGIC AND LYMPHATIC: Denies easy bruising or bleeding. SKIN: Denies rash or lesion. MUSCULOSKELETAL: Denies pain in neck, back, shoulder, knees, hips or arthritic symptoms.  NEUROLOGIC: Denies paralysis, paresthesias.  PSYCHIATRIC: Denies anxiety or depressive symptoms.   VITAL SIGNS:  Blood pressure (!) 106/56, pulse (!) 55, temperature 98.2 F (36.8 C),  resp. rate 20, height 5\' 5"  (1.651 m), weight 114.2 kg (251 lb 11.2 oz), SpO2 98 %.  I/O:    Intake/Output Summary (Last 24 hours) at 10/04/16 1314 Last data filed at 10/04/16 0900  Gross per 24 hour  Intake           2887.5 ml  Output             1677 ml  Net           1210.5 ml    PHYSICAL EXAMINATION:  GENERAL:  75 y.o.-year-old patient lying in the bed with no acute distress.  EYES: Pupils equal, round, reactive to light and accommodation. No scleral icterus. Extraocular muscles intact.  HEENT: Head atraumatic, normocephalic. Oropharynx and nasopharynx clear.  NECK:  Supple, no jugular venous distention. No thyroid enlargement, no tenderness.  LUNGS: Normal breath sounds bilaterally, no wheezing, rales,rhonchi or crepitation. No use of accessory muscles of respiration.  CARDIOVASCULAR: S1, S2 normal. No murmurs, rubs, or gallops.  ABDOMEN: Soft, non-tender, non-distended. Bowel sounds present. No organomegaly or mass.  EXTREMITIES: No pedal edema, cyanosis, or clubbing. Chronic varicose veins NEUROLOGIC: Cranial nerves II through XII are intact. Muscle strength 5/5 in all extremities. Sensation intact. Gait not checked.  PSYCHIATRIC: The patient is alert and oriented x 3.  SKIN: No obvious rash, lesion, or ulcer.   DATA REVIEW:   CBC  Recent Labs Lab 10/04/16 0339  WBC 7.9  HGB 9.2*  HCT 26.4*  PLT 187    Chemistries   Recent Labs Lab 10/04/16 0339  NA 135  K 4.2  CL 102  CO2 30  GLUCOSE 147*  BUN 28*  CREATININE 1.01*  CALCIUM 8.3*    Cardiac  Enzymes  Recent Labs Lab 10/02/16 2248  TROPONINI <0.03    Microbiology Results  Results for orders placed or performed during the hospital encounter of 10/03/16  Blood culture (routine x 2)     Status: None (Preliminary result)   Collection Time: 10/03/16  4:29 AM  Result Value Ref Range Status   Specimen Description BLOOD RIGHT HAND  Final   Special Requests BOTTLES DRAWN AEROBIC AND ANAEROBIC 12CC  Final   Culture NO GROWTH 1 DAY  Final   Report Status PENDING  Incomplete    RADIOLOGY:  Dg Chest 2 View  Result Date: 10/03/2016 CLINICAL DATA:  75 year old female with cough and shortness of breath EXAM: CHEST  2 VIEW COMPARISON:  Chest radiograph dated 11/18/2015 FINDINGS: Left lung base opacity may represent atelectasis/scarring versus pneumonia. The right lung is clear. No significant pleural effusion. There is no pneumothorax. Stable top-normal cardiac size. Osteopenia with degenerative changes of the spine. Partially visualized old left humeral neck fracture. Right shoulder hemiarthroplasty. No acute fracture. IMPRESSION: Left lung base atelectasis versus infiltrate. Clinical correlation is recommended. Electronically Signed   By: Anner Crete M.D.   On: 10/03/2016 02:28    EKG:   Orders placed or performed during the hospital encounter of 10/03/16  . ED EKG within 10 minutes  . ED EKG within 10 minutes  . EKG 12-Lead  . EKG 12-Lead  . EKG      Management plans discussed with the patient, she is in agreement.  CODE STATUS:     Code Status Orders        Start     Ordered   10/03/16 0541  Full code  Continuous     10/03/16 0540    Code Status History    Date  Active Date Inactive Code Status Order ID Comments User Context   11/15/2015 10:23 PM 11/19/2015  3:14 PM Full Code [id number]  Lytle Butte, MD ED      TOTAL TIME TAKING CARE OF THIS PATIENT: 45  minutes.   Note: This dictation was prepared with Dragon dictation along with smaller phrase  technology. Any transcriptional errors that result from this process are unintentional.   @[facility] @  on 10/04/2016 at 1:14 PM  Between 7am to 6pm - Pager - [phone]  After 6pm go to [email or web address] - password [email or web address] [facility]  Office  [phone]  CC: Primary care physician; Talbert Cage, FNP

## 2016-10-04 NOTE — Progress Notes (Signed)
PT is recommending SNF. Clinical Social Worker (CSW) met with patient to discuss D/C plan. Patient refused SNF and reported that she will [facility] with [facility]. [facility] today and is agreeable for patient to D/C home. Patient is medically stable for D/C today. [facility] will pick patient up around 1 pm today and will bring oxygen tank. RN aware of above. RN case manager aware of above. Please reconsult if future social work needs arise. CSW signing off.   [employer], LCSW [phone]

## 2016-10-04 NOTE — Evaluation (Signed)
Physical Therapy Evaluation Patient Details Name: Sara Pierce MRN: [id number] DOB: [birth year] Today's Date: 10/04/2016   History of Present Illness  Pt is a 75 y/o F who presented to the ED with cough and SOB.  She was recently dx with bronchitis and prescribed antibiotics which were not helping.  Pt found to have pneumonia.  Pt had R LE varicose vein rupture and bleeding which is now well controlled with pressure dressing.  Pt's PMH includes COPD, CHF, cardiomyopathy, cataract, cerebral hemorrhage, CKD, depression, mixed incontinence, joint replacement.    Clinical Impression  Pt admitted with above diagnosis. Pt currently with functional limitations due to the deficits listed below (see PT Problem List). Sara Pierce presents with generalized weakness and is quick to fatigue while ambulating in her room.  She requires min guard assist while ambulating and lives alone at an ALF PTA.  She is a high fall risk and recommending ST SNF at home prior to returning home. SpO2 91% on 3L O2 while ambulating today.  Pt reports she is on 3L O2 at baseline.  Pt will benefit from skilled PT to increase their independence and safety with mobility to allow discharge to the venue listed below.      Follow Up Recommendations SNF    Equipment Recommendations  None recommended by PT    Recommendations for Other Services       Precautions / Restrictions Precautions Precautions: Fall;Other (comment) Precaution Comments: Monitor O2 Restrictions Weight Bearing Restrictions: No      Mobility  Bed Mobility Overal bed mobility: Needs Assistance Bed Mobility: Supine to Sit     Supine to sit: Supervision     General bed mobility comments: Heavy use of bed rail and increased effort and time.    Transfers Overall transfer level: Needs assistance Equipment used: Rolling walker (2 wheeled) Transfers: Sit to/from Stand Sit to Stand: Supervision         General transfer comment: Supervision for safety.   Pt reports dizziness upon standing which clears within 30 seconds.  Cues for hand placement during stand>sit.  Ambulation/Gait Ambulation/Gait assistance: Min guard Ambulation Distance (Feet): 15 Feet Assistive device: Rolling walker (2 wheeled) Gait Pattern/deviations: Decreased stride length;Shuffle;Antalgic;Trunk flexed Gait velocity: decreased Gait velocity interpretation: <1.8 ft/sec, indicative of risk for recurrent falls General Gait Details: Pt demonstrates SOB and fatigues quickly, declining ambulation in hallway.  SpO2 91% on 3L O2 while ambulating in room with RW.    Stairs            Wheelchair Mobility    Modified Rankin (Stroke Patients Only)       Balance Overall balance assessment: Needs assistance Sitting-balance support: No upper extremity supported;Feet supported Sitting balance-Leahy Scale: Fair     Standing balance support: Bilateral upper extremity supported;During functional activity Standing balance-Leahy Scale: Fair Standing balance comment: RW for support                             Pertinent Vitals/Pain Pain Assessment: 0-10 Pain Score: 5  Pain Location: L groin Pain Descriptors / Indicators: Throbbing Pain Intervention(s): Limited activity within patient's tolerance;Monitored during session;Repositioned    Home Living Family/patient expects to be discharged to:: Private residence Living Arrangements: Alone Available Help at Discharge: Friend(s);Available PRN/intermittently Type of Home: Assisted living Home Access: Elevator     Home Layout: One level Home Equipment: Grab bars - tub/shower;Bedside commode;Shower seat - built in;Walker - 4 wheels;Hand held shower head;Hospital bed  Prior Function Level of Independence: Independent with assistive device(s)         Comments: Pt ambulates with rollator at all times.  She is no longer driving, has a driver.  Meals are provided by facility.  Has an aide that cleans and  does chores 2 days/wk for 2 hrs.  Denies any falls over the past 6 months.  [facility] where she [facility] the [facility] and does UE and LE exercises with PT there.     Hand Dominance   Dominant Hand: Right    Extremity/Trunk Assessment   Upper Extremity Assessment: Generalized weakness           Lower Extremity Assessment: Generalized weakness         Communication   Communication: No difficulties  Cognition Arousal/Alertness: Awake/alert Behavior During Therapy: WFL for tasks assessed/performed Overall Cognitive Status: Within Functional Limits for tasks assessed                      General Comments      Exercises General Exercises - Upper Extremity Shoulder Flexion: AROM;Both;10 reps;Seated General Exercises - Lower Extremity Ankle Circles/Pumps: AROM;Both;10 reps;Supine Long Arc Quad: AROM;Both;10 reps;Seated   Assessment/Plan    PT Assessment Patient needs continued PT services  PT Problem List Decreased strength;Decreased activity tolerance;Decreased balance;Decreased mobility;Decreased knowledge of use of DME;Decreased safety awareness;Cardiopulmonary status limiting activity          PT Treatment Interventions DME instruction;Gait training;Functional mobility training;Therapeutic activities;Therapeutic exercise;Balance training;Patient/family education    PT Goals (Current goals can be found in the Care Plan section)  Acute Rehab PT Goals Patient Stated Goal: to get stronger and go home PT Goal Formulation: With patient Time For Goal Achievement: 10/11/16 Potential to Achieve Goals: Good    Frequency Min 2X/week   Barriers to discharge Decreased caregiver support Lives alone    Co-evaluation               End of Session Equipment Utilized During Treatment: Gait belt;Oxygen Activity Tolerance: Patient tolerated treatment well;Patient limited by fatigue Patient left: in chair;with call bell/phone within reach;with  chair alarm set Nurse Communication: Mobility status;Other (comment) (SpO2)    Functional Assessment Tool Used: Clinical Judgement Functional Limitation: Mobility: Walking and moving around Mobility: Walking and Moving Around Current Status [phone]): At least 20 percent but less than 40 percent impaired, limited or restricted Mobility: Walking and Moving Around Goal Status [phone]): At least 1 percent but less than 20 percent impaired, limited or restricted    Time: 0840-0913 PT Time Calculation (min) (ACUTE ONLY): 33 min   Charges:   PT Evaluation $PT Eval Low Complexity: 1 Procedure PT Treatments $Gait Training: 8-22 mins $Therapeutic Activity: 8-22 mins   PT G Codes:   PT G-Codes **NOT FOR INPATIENT CLASS** Functional Assessment Tool Used: Clinical Judgement Functional Limitation: Mobility: Walking and moving around Mobility: Walking and Moving Around Current Status [id number]): At least 20 percent but less than 40 percent impaired, limited or restricted Mobility: Walking and Moving Around Goal Status [phone]): At least 1 percent but less than 20 percent impaired, limited or restricted   Collie Siad PT, DPT 10/04/2016, 9:31 AM

## 2016-10-06 LAB — URINE CULTURE

## 2016-10-08 LAB — CULTURE, BLOOD (ROUTINE X 2): CULTURE: NO GROWTH

## 2016-11-12 ENCOUNTER — Other Ambulatory Visit: Payer: Self-pay | Admitting: Gerontology

## 2016-11-12 DIAGNOSIS — Z0000 Encounter for general adult medical examination without abnormal findings: Secondary | ICD-10-CM

## 2016-12-03 LAB — CULTURE, BLOOD (ROUTINE X 2): Culture: NO GROWTH

## 2016-12-05 IMAGING — CT CT HEAD W/O CM
2 of 4 series · 13 of 30 positions shown, 16 images · non-contrast
Comparison: 05/19/2014 and maxillofacial CT 02/02/2014

CLINICAL DATA: Fall this morning, tripped over step at home, back
of the head hematoma

EXAM:
CT HEAD WITHOUT CONTRAST
CT CERVICAL SPINE WITHOUT CONTRAST
TECHNIQUE: Multidetector CT imaging of the head and cervical spine was
performed following the standard protocol without intravenous
contrast. Multiplanar CT image reconstructions of the cervical spine
were also generated.

[Series 2: head wo · axial · 0.39mm/px · z∈[-31,+14]mm · 2 of 32 slices shown]
[im 11/32  brain]
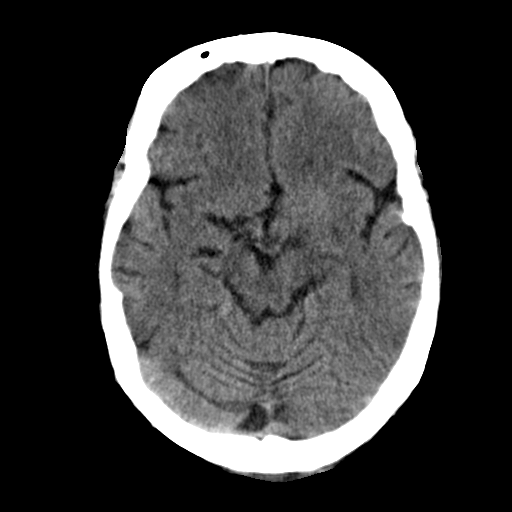
[im 21/32  brain]
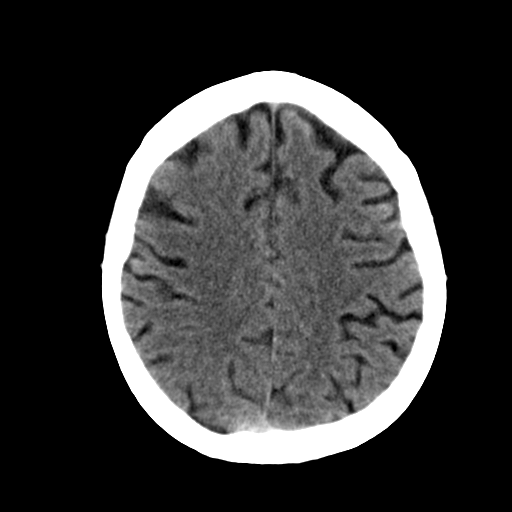

[Series 10: orthogonal axials · axial · 0.21mm/px · z∈[-248,-121]mm · 11 of 89 slices shown, 14 images]
[im 8/89  brain]
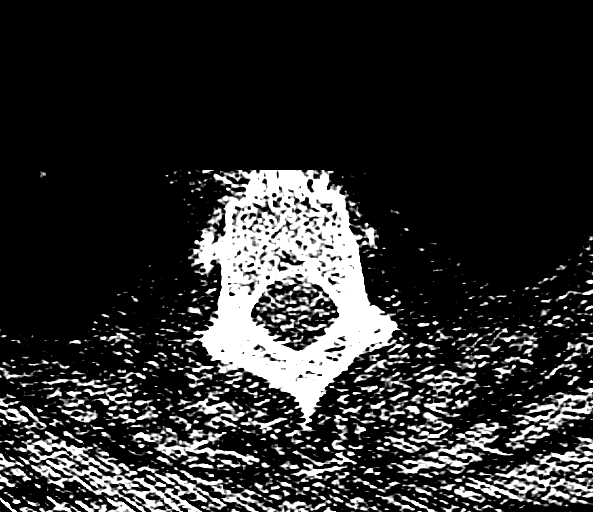
[im 8/89  bone]
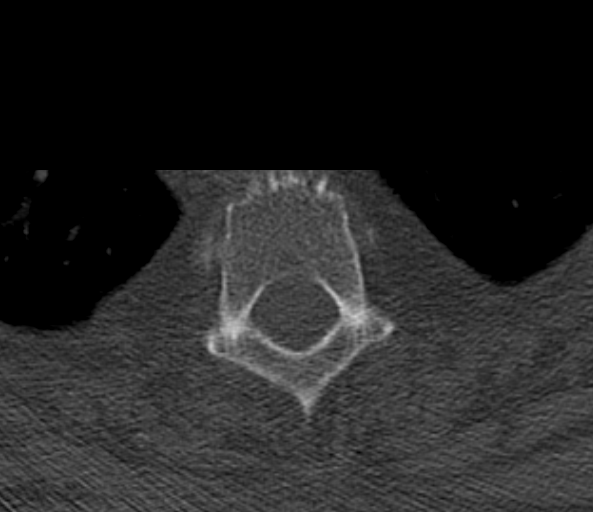
[im 15/89  brain]
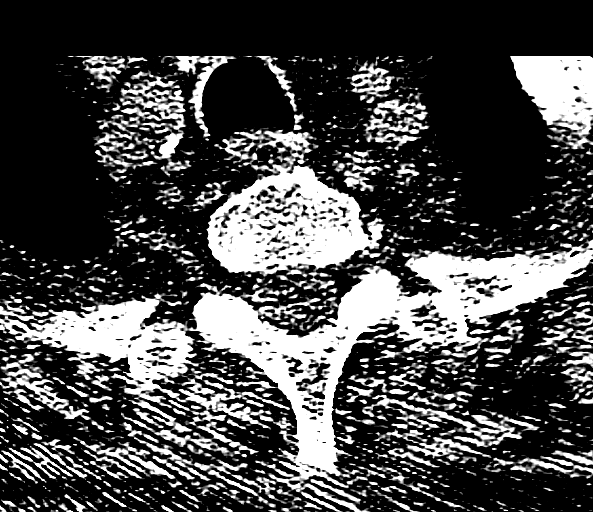
[im 23/89  brain]
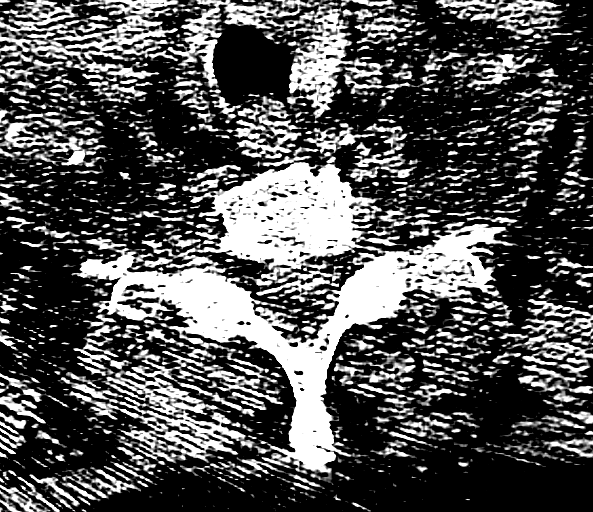
[im 30/89  brain]
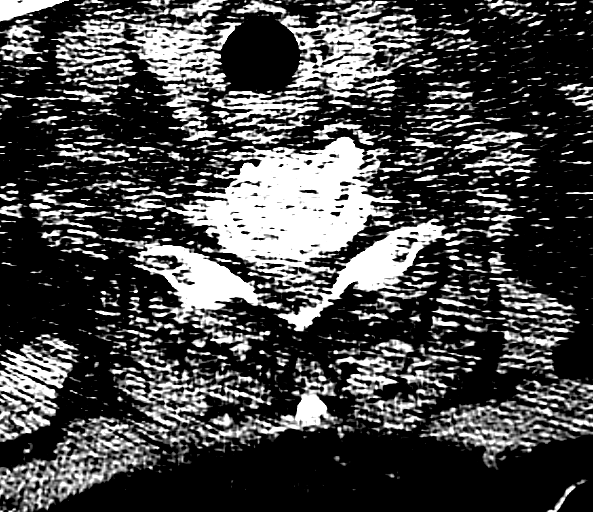
[im 37/89  brain]
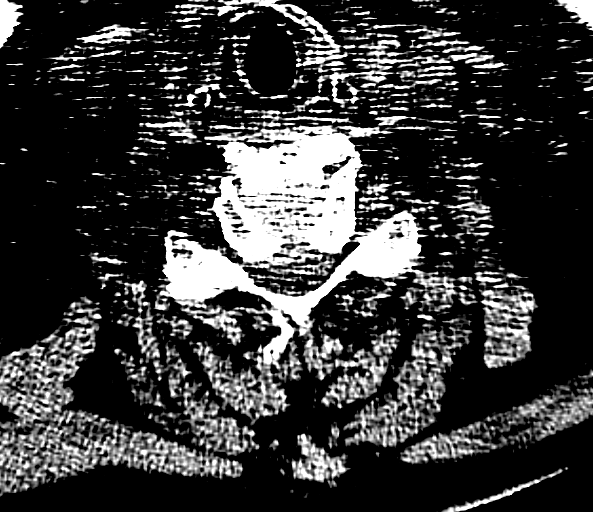
[im 37/89  bone]
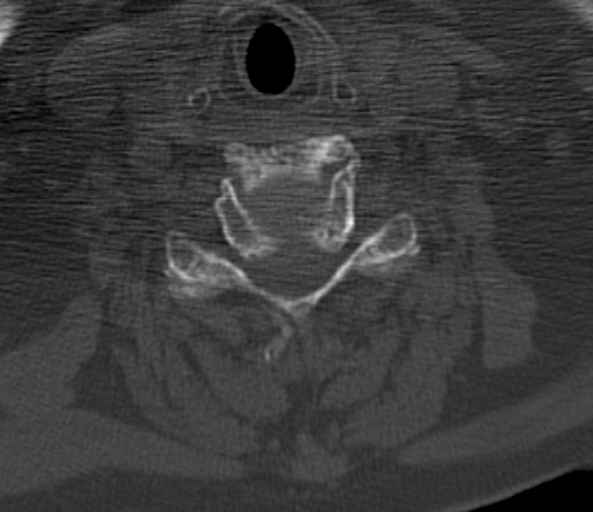
[im 45/89  brain]
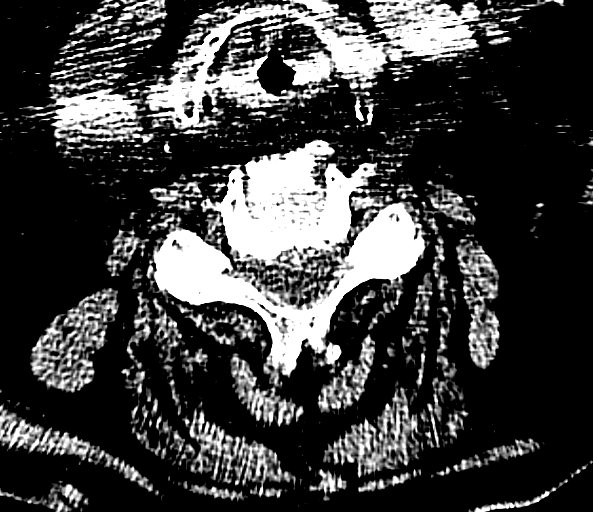
[im 52/89  brain]
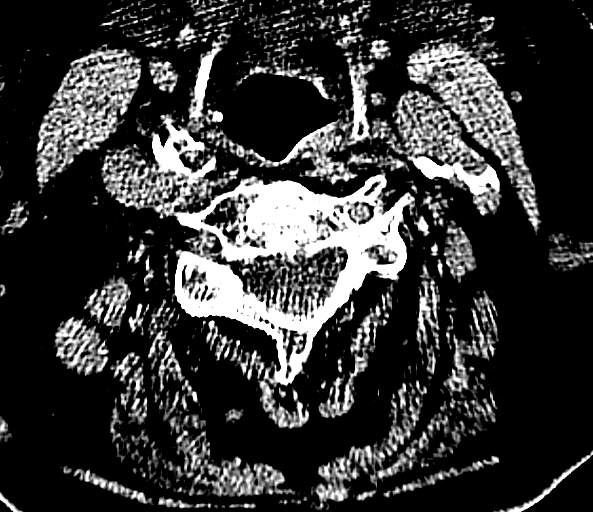
[im 59/89  brain]
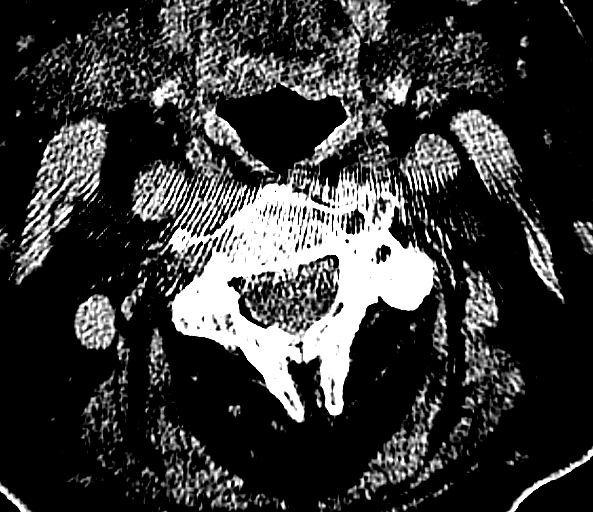
[im 67/89  brain]
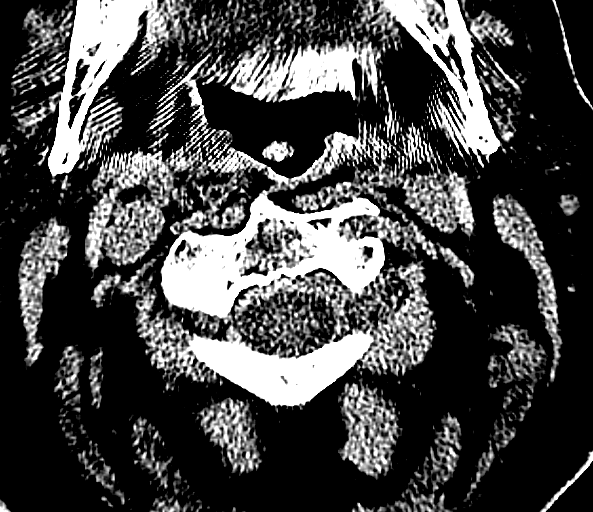
[im 67/89  bone]
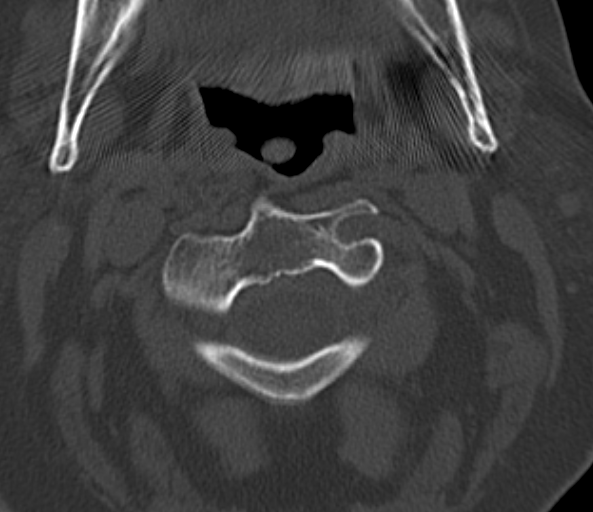
[im 74/89  brain]
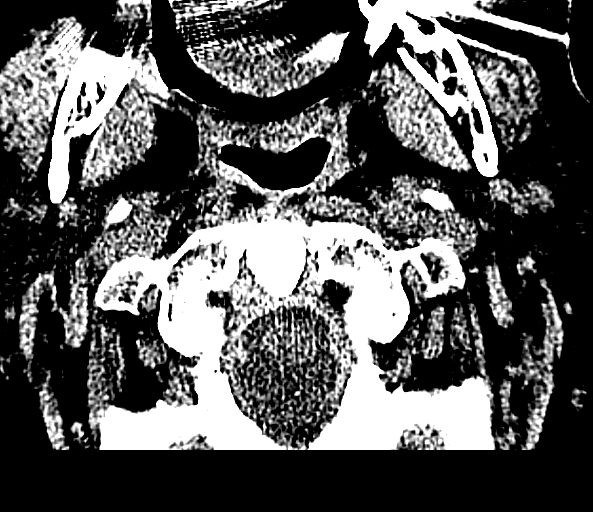
[im 81/89  brain]
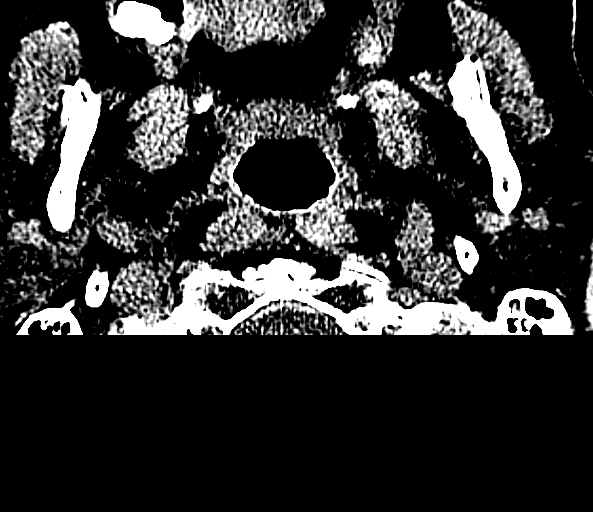

[13 of 30 positions shown; findings below may reference images not displayed]

FINDINGS: CT HEAD FINDINGS

No skull fracture is noted. Mild scalp swelling and subcutaneous
stranding midline posterior scalp see axial images 11 and 12. No
intracranial hemorrhage, mass effect or midline shift.
Atherosclerotic calcifications of carotid siphon. Mild cerebral
atrophy.

No acute infarction. No mass lesion is noted on this unenhanced
scan.

CT CERVICAL SPINE FINDINGS

Axial images of the cervical spine shows no acute fracture or
subluxation. Computer processed images shows no acute fracture or
subluxation. There are degenerative changes C1-C2 articulation.
There is bony fusion of C3-C4 vertebral body. Mild disc space
flattening with anterior spurring at C4-C5-C5-C6 C6-C7 and C7-T1
level. No prevertebral soft tissue swelling. Cervical airway is
patent. Spinal canal is patent. There is no pneumothorax in
visualized lung apices.
IMPRESSION: 1. No acute intracranial abnormality. Mild cerebral atrophy. There
is mild scalp swelling and subcutaneous stranding midline posterior
scalp see axial images 11 and 12.
2. No cervical spine acute fracture or subluxation. Bony fusion of
C3-C4 vertebral bodies. Multilevel degenerative changes as described
above.

## 2016-12-22 ENCOUNTER — Ambulatory Visit
Admission: RE | Admit: 2016-12-22 | Discharge: 2016-12-22 | Disposition: A | Discharge status: Home / Self Care | Payer: Medicare (Managed Care) | Source: Ambulatory Visit | Attending: Gerontology | Admitting: Gerontology

## 2016-12-22 DIAGNOSIS — Z0000 Encounter for general adult medical examination without abnormal findings: Secondary | ICD-10-CM

## 2016-12-22 DIAGNOSIS — Z1231 Encounter for screening mammogram for malignant neoplasm of breast: Secondary | ICD-10-CM | POA: Insufficient documentation

## 2017-01-04 ENCOUNTER — Other Ambulatory Visit: Payer: Self-pay | Admitting: Gerontology

## 2017-01-04 DIAGNOSIS — R928 Other abnormal and inconclusive findings on diagnostic imaging of breast: Secondary | ICD-10-CM

## 2017-01-12 IMAGING — CR DG KNEE COMPLETE 4+V*L*
4 series · 4 of 4 positions shown · non-contrast
Comparison: 02/02/2014

CLINICAL DATA: Fall this morning. Knee pain. Patellar laceration.
Initial encounter.

EXAM:
LEFT KNEE - COMPLETE 4+ VIEW

[knee ap]
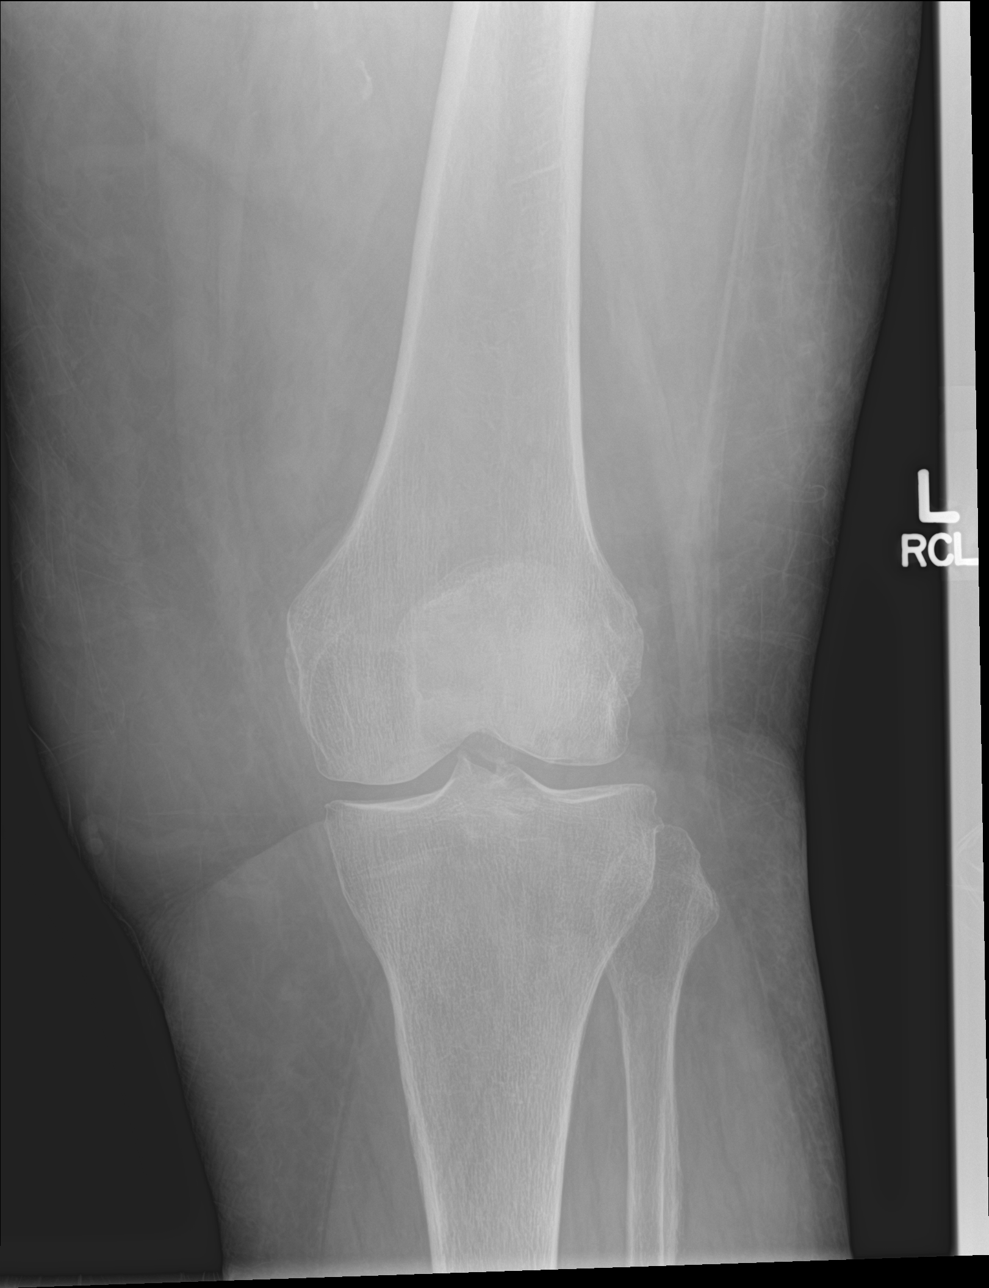

[knee obl (1 of 2)]
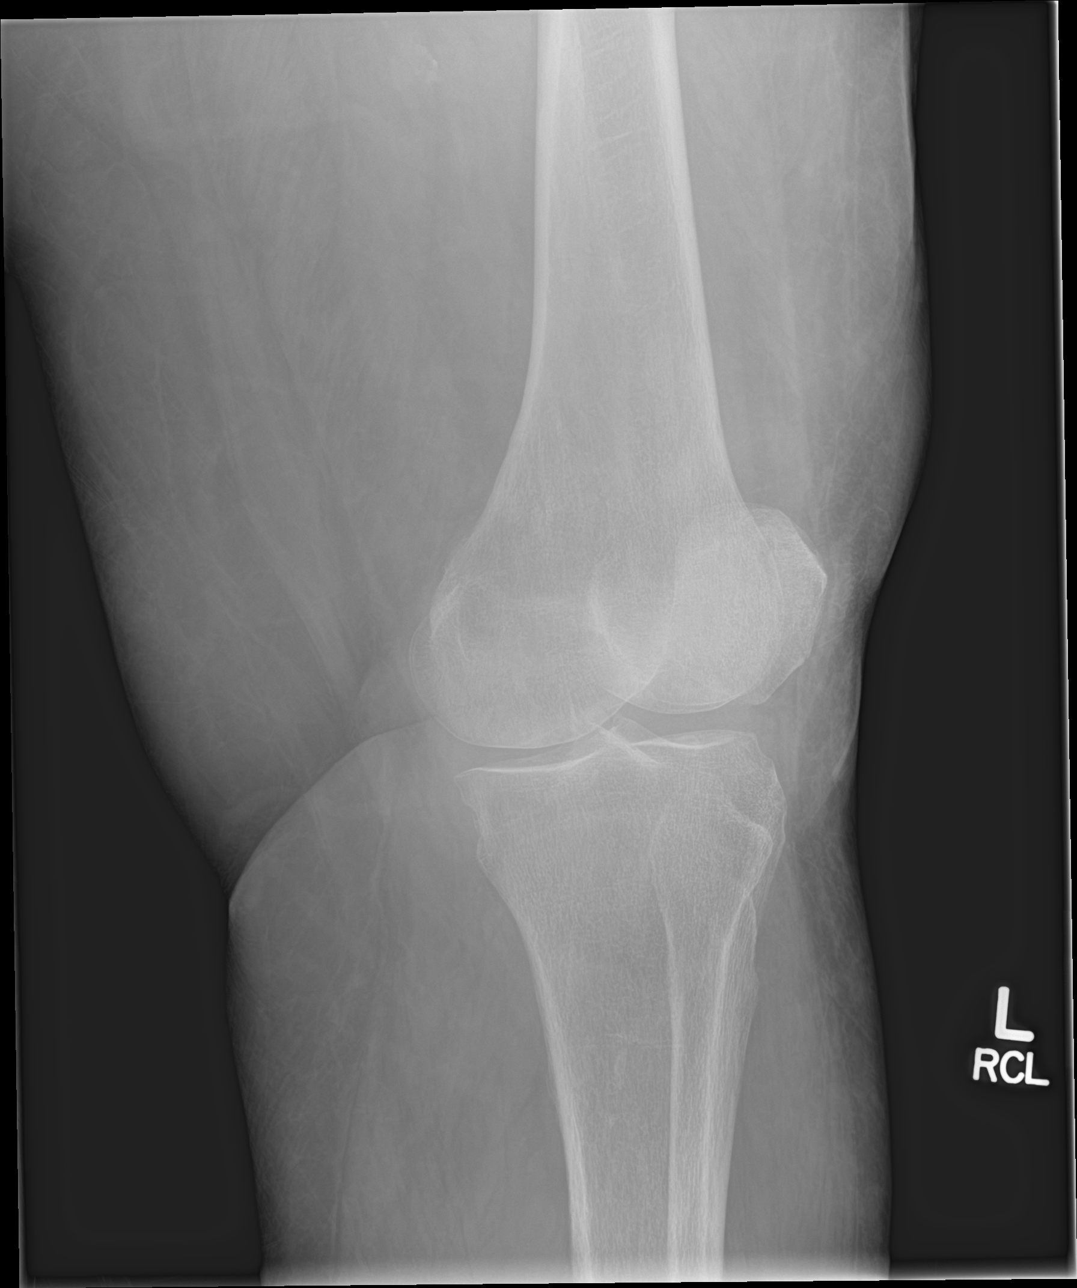

[knee obl (2 of 2)]
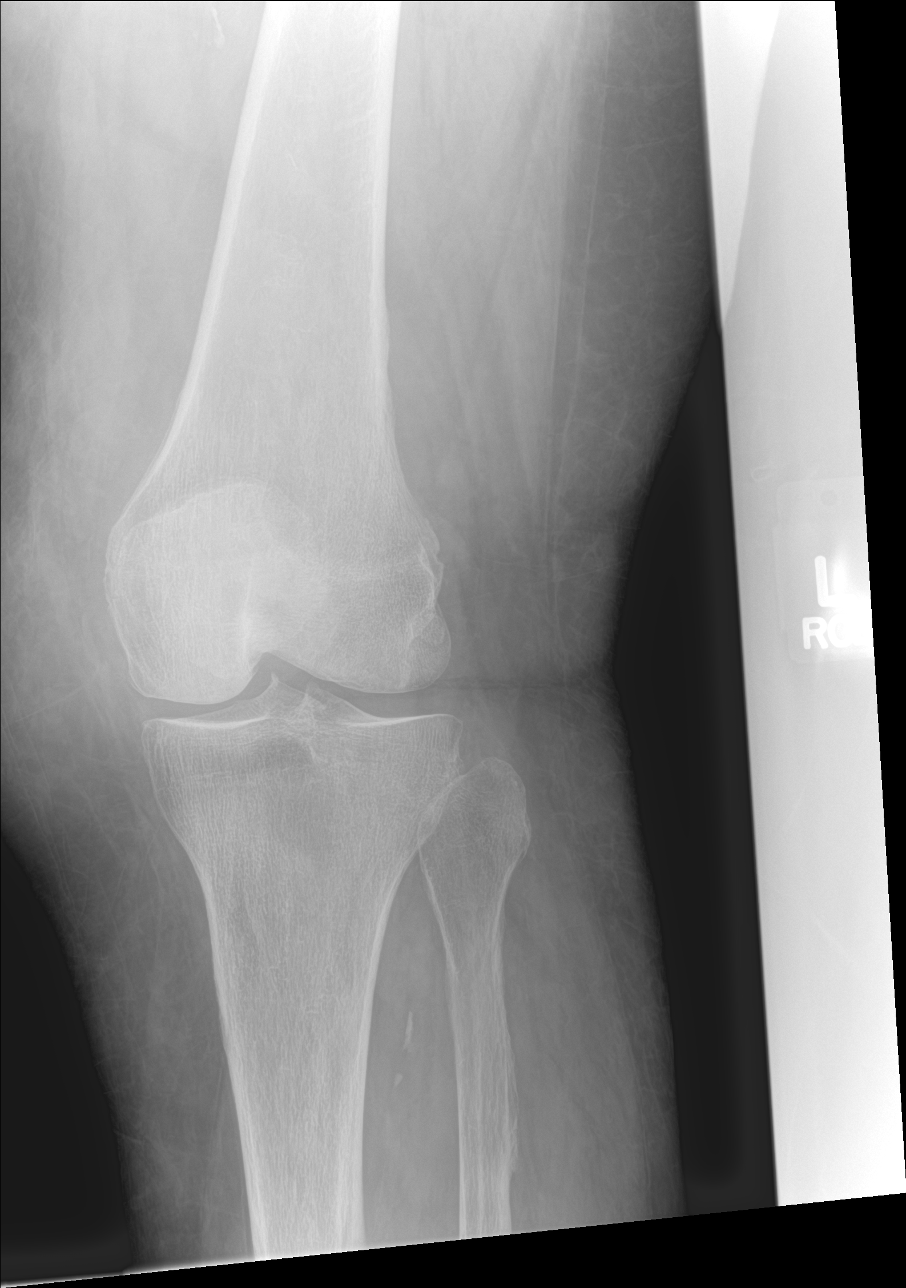

[knee lat]
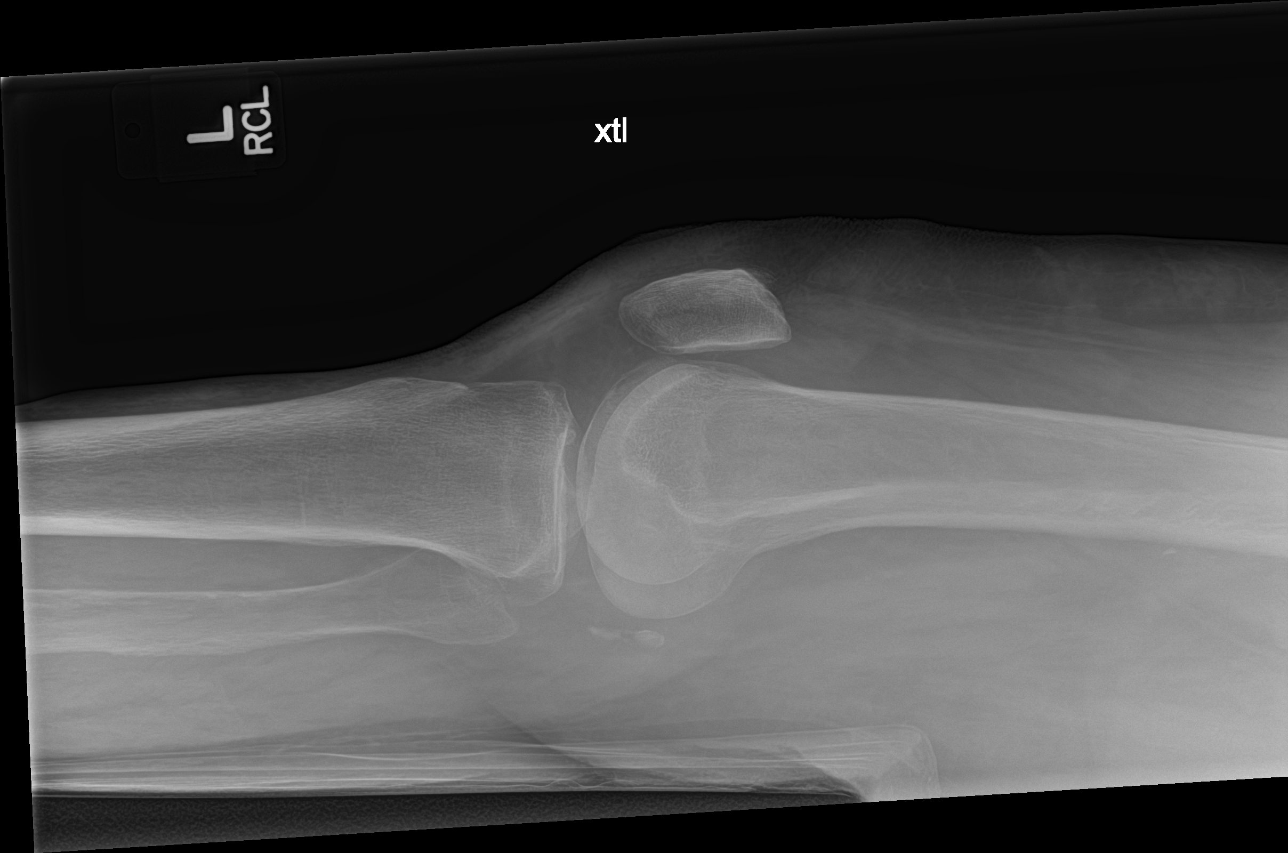

[4 of 4 positions shown; findings below may reference images not displayed]

FINDINGS: Mild spurring is again noted involving the tibial spines. No acute
fracture, dislocation, or knee joint effusion is identified.
Prepatellar soft tissue swelling on the prior study has resolved.
Bones are osteopenic. Joint space widths are preserved.
IMPRESSION: No acute osseous abnormality.

## 2017-01-12 IMAGING — CR DG SHOULDER 2+V*L*
2 series · 2 of 2 positions shown · non-contrast
Comparison: None.

CLINICAL DATA: Fall this morning. Unable to move arm. Shoulder
injury. Initial encounter.

EXAM:
LEFT SHOULDER - 2+ VIEW

[shoulder grashey]
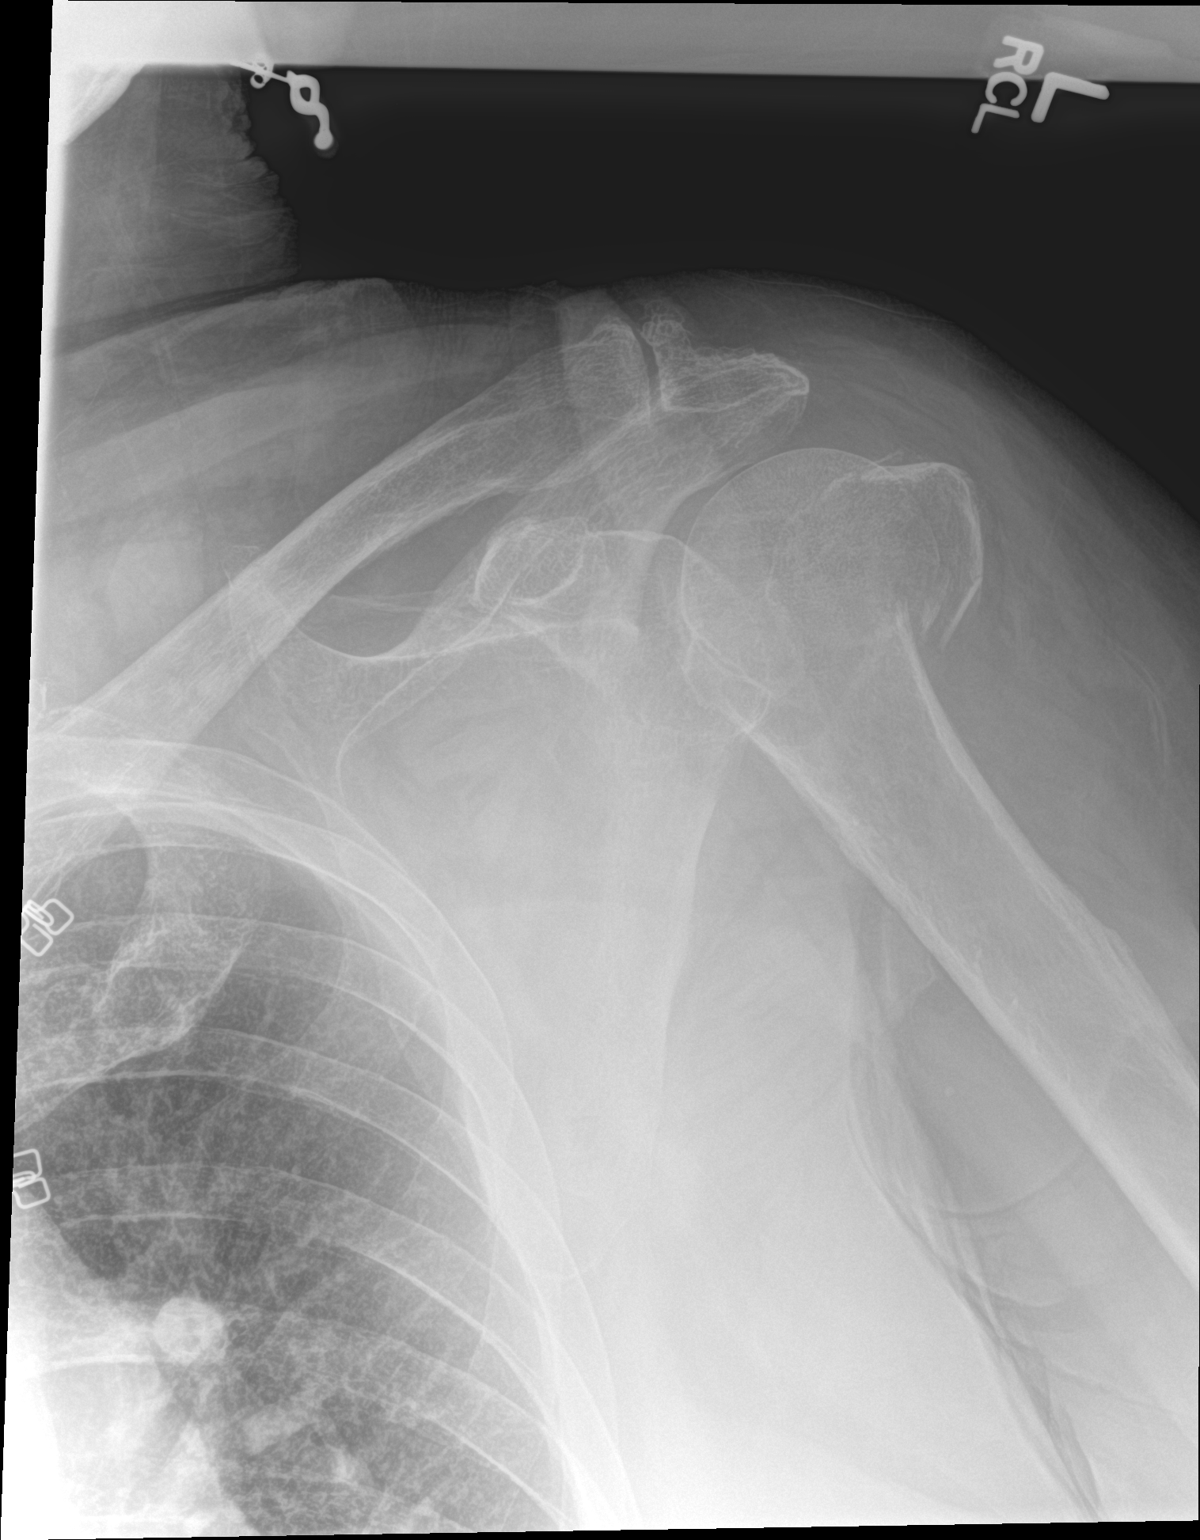

[shoulder y view]
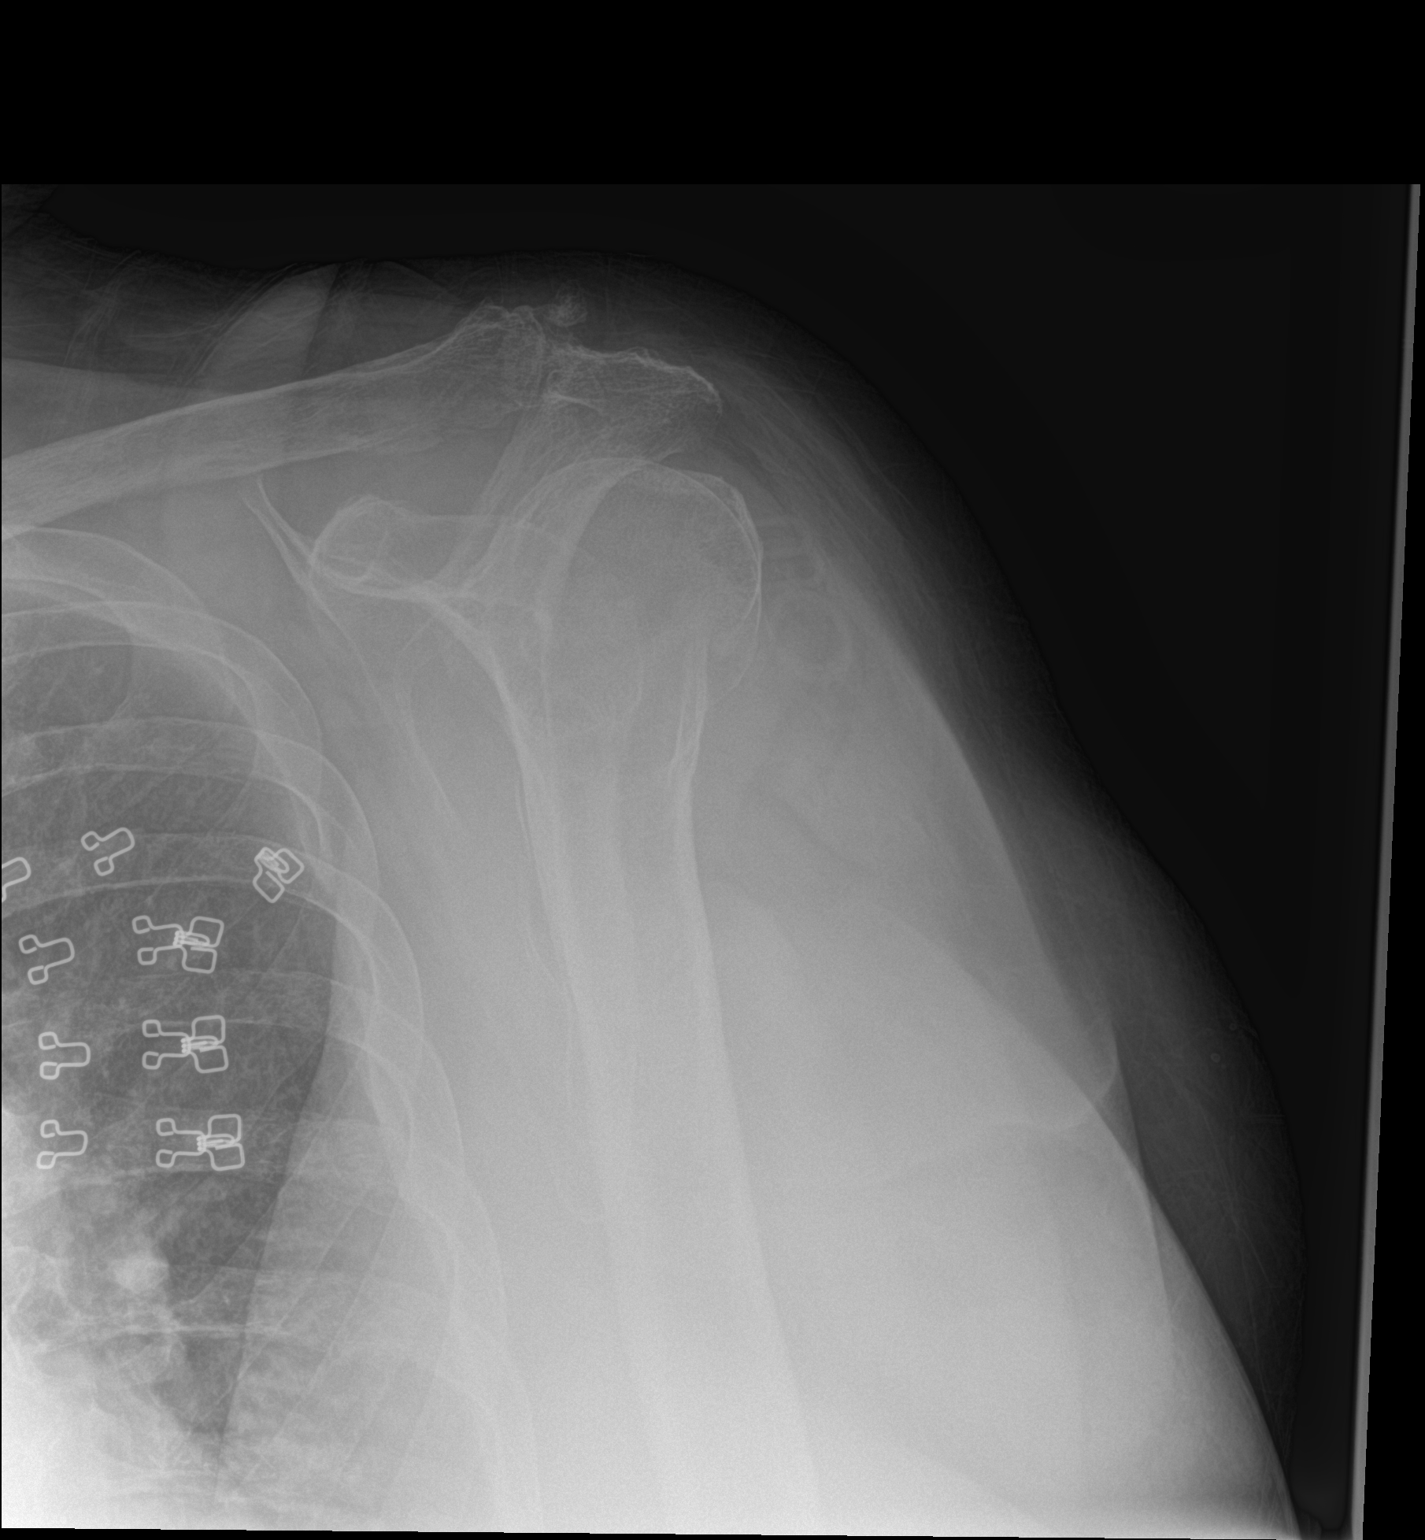

[2 of 2 positions shown; findings below may reference images not displayed]

FINDINGS: There is a comminuted, mildly impacted fracture involving the
surgical neck of the humerus. There is mild displacement of the main
greater tuberosity fragment. The main distal humeral fragment is
mildly displaced medially. The humeral head remains approximated
with the glenoid. No lytic or blastic osseous lesion is identified.
Acromioclavicular joint osteoarthrosis is noted.
IMPRESSION: Comminuted, mildly displaced proximal humerus fracture.

## 2017-01-14 ENCOUNTER — Ambulatory Visit
Admission: RE | Admit: 2017-01-14 | Discharge: 2017-01-14 | Disposition: A | Discharge status: Home / Self Care | Payer: Medicare (Managed Care) | Source: Ambulatory Visit | Attending: Gerontology | Admitting: Gerontology

## 2017-01-14 DIAGNOSIS — R928 Other abnormal and inconclusive findings on diagnostic imaging of breast: Secondary | ICD-10-CM

## 2017-04-23 IMAGING — CR DG CHEST 2V
1 series · 2 of 2 positions shown · non-contrast
Comparison: 02/05/2015 and prior chest radiographs

CLINICAL DATA: Acute shortness of breath for 1 week.

EXAM:
CHEST  2 VIEW

[Series 1: w chest pa · 0.14mm/px · 2 of 2 slices shown]
[im 1/2]
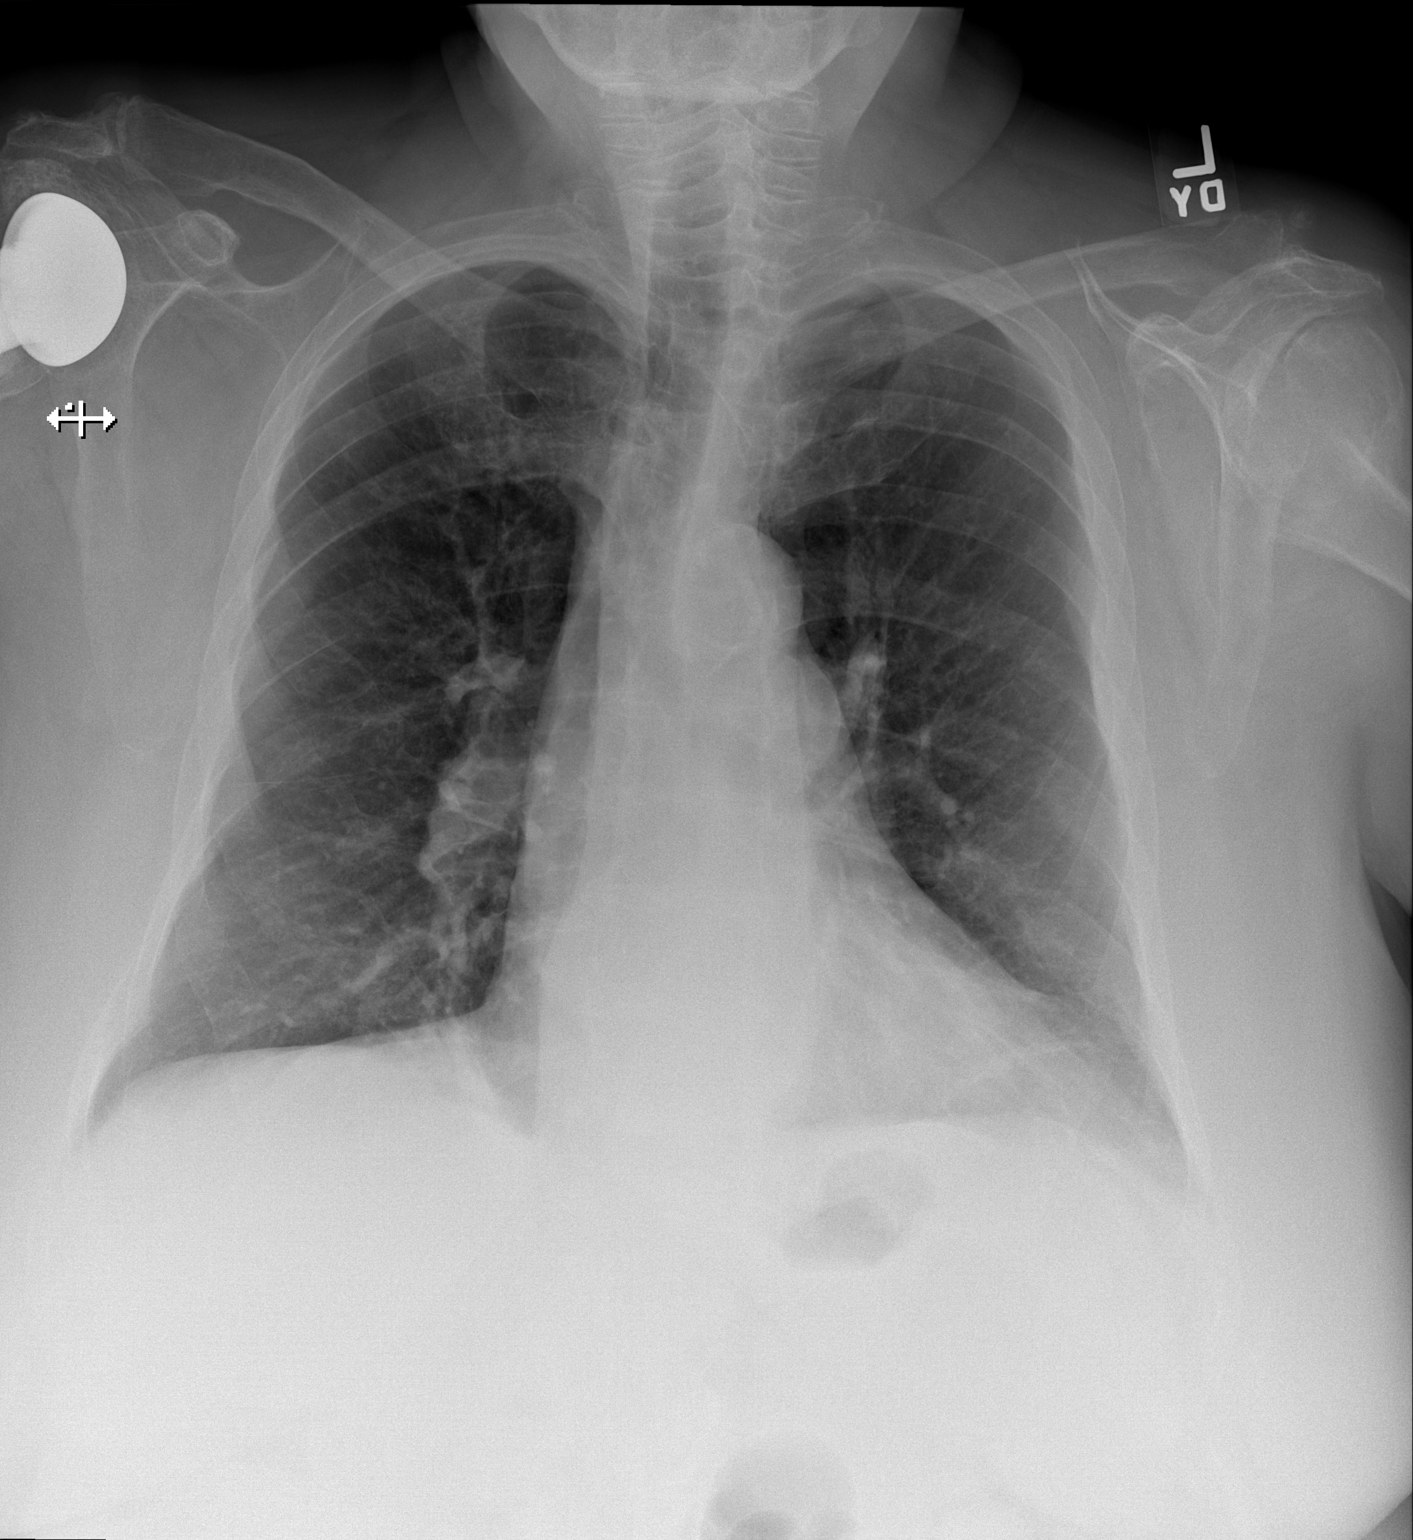
[im 2/2]
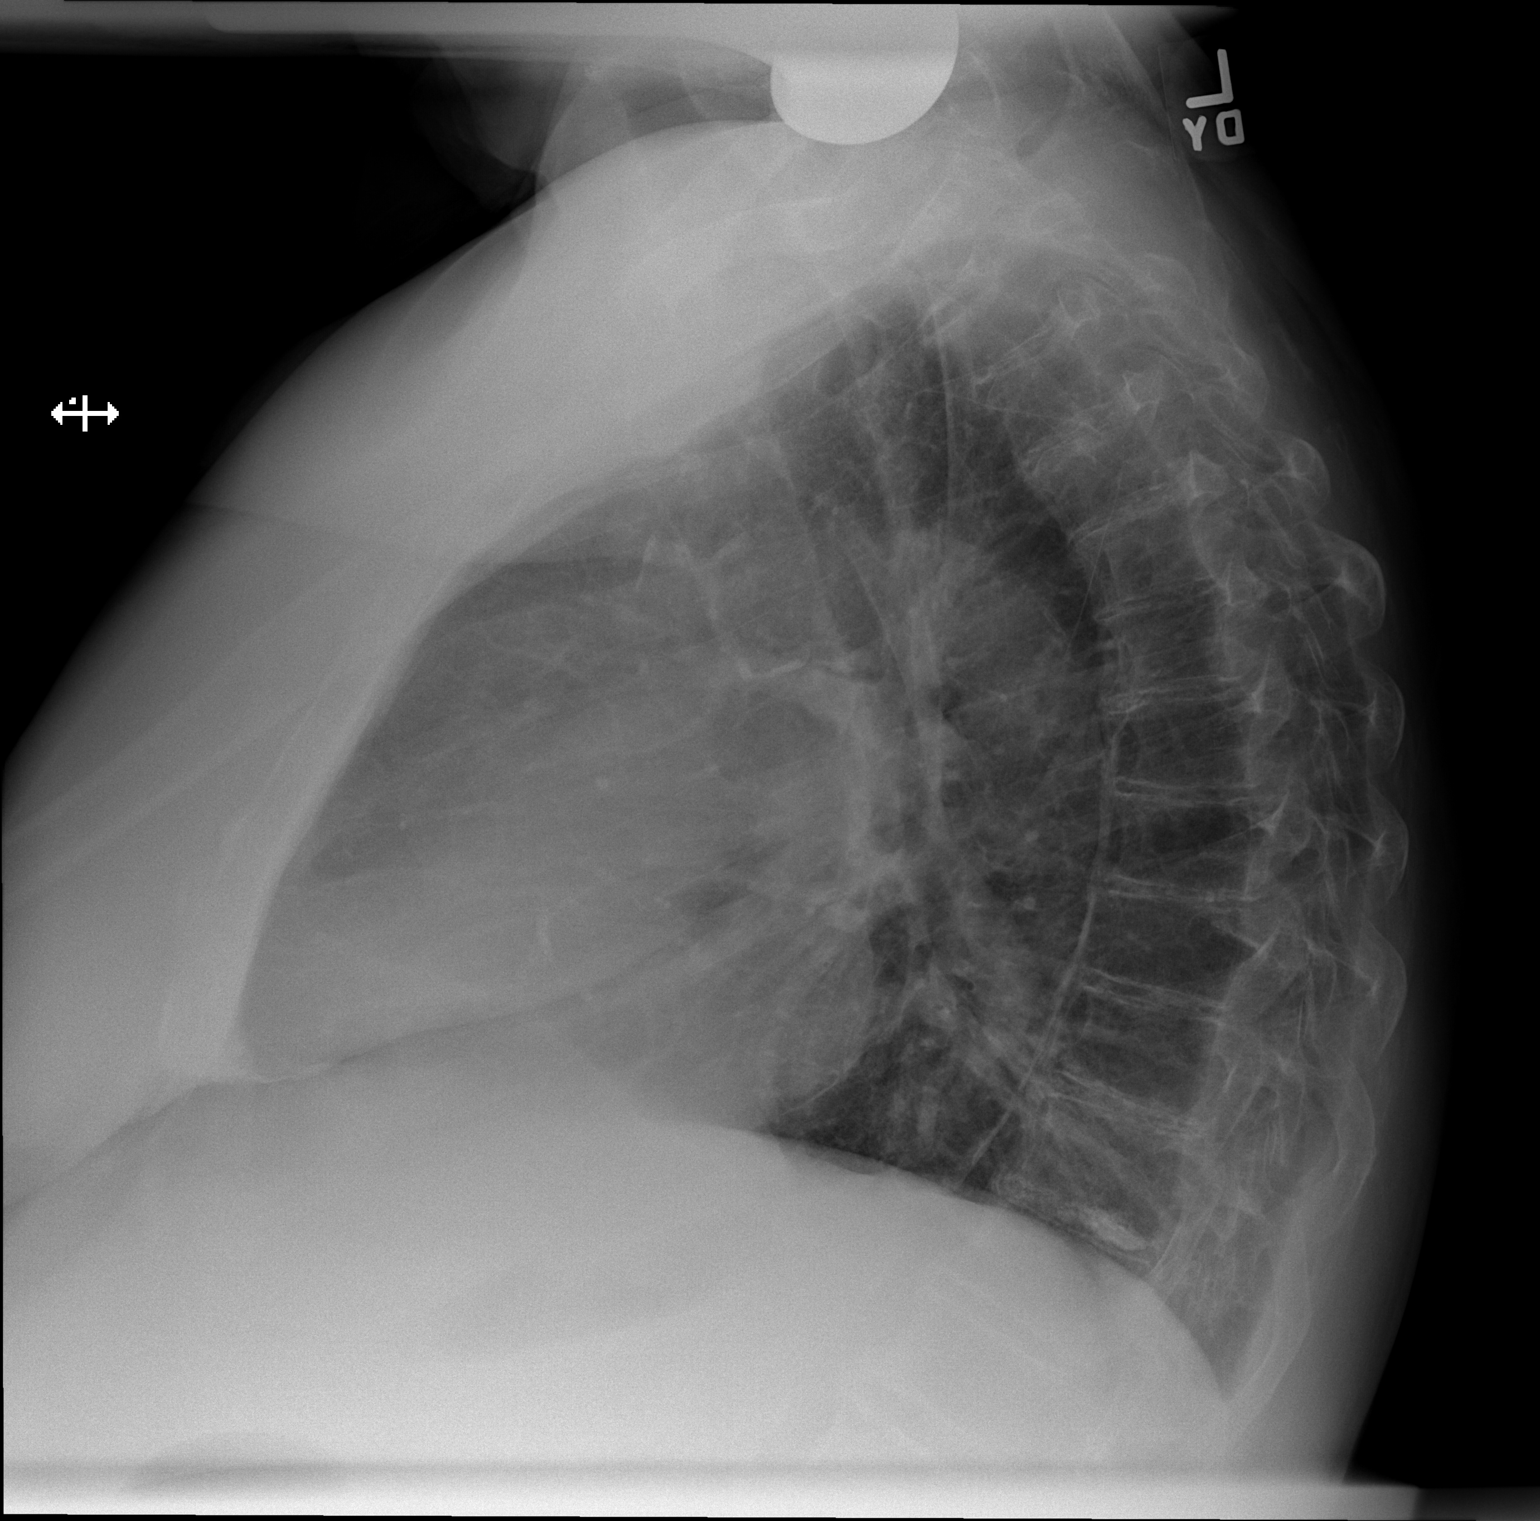

[2 of 2 positions shown; findings below may reference images not displayed]

FINDINGS: Mild cardiomegaly again noted.

Minimal bibasilar scarring again noted.

There is no evidence of focal airspace disease, pulmonary edema,
suspicious pulmonary nodule/mass, pleural effusion, or pneumothorax.
No acute bony abnormalities are identified.

Right shoulder hemiarthroplasty again identified.
IMPRESSION: Mild cardiomegaly without evidence of active cardiopulmonary
disease.

## 2017-06-15 ENCOUNTER — Emergency Department
Admission: EM | Admit: 2017-06-15 | Discharge: 2017-06-15 | Disposition: A | Discharge status: Home / Self Care | Payer: Medicare (Managed Care) | Attending: Emergency Medicine | Admitting: Emergency Medicine

## 2017-06-15 ENCOUNTER — Emergency Department: Payer: Medicare (Managed Care)

## 2017-06-15 ENCOUNTER — Encounter: Payer: Self-pay | Admitting: Emergency Medicine

## 2017-06-15 DIAGNOSIS — J181 Lobar pneumonia, unspecified organism: Secondary | ICD-10-CM | POA: Insufficient documentation

## 2017-06-15 DIAGNOSIS — E119 Type 2 diabetes mellitus without complications: Secondary | ICD-10-CM | POA: Diagnosis not present

## 2017-06-15 DIAGNOSIS — Z7951 Long term (current) use of inhaled steroids: Secondary | ICD-10-CM | POA: Diagnosis not present

## 2017-06-15 DIAGNOSIS — Z79810 Long term (current) use of selective estrogen receptor modulators (SERMs): Secondary | ICD-10-CM | POA: Insufficient documentation

## 2017-06-15 DIAGNOSIS — Z79891 Long term (current) use of opiate analgesic: Secondary | ICD-10-CM | POA: Diagnosis not present

## 2017-06-15 DIAGNOSIS — J441 Chronic obstructive pulmonary disease with (acute) exacerbation: Secondary | ICD-10-CM | POA: Diagnosis not present

## 2017-06-15 DIAGNOSIS — Z7952 Long term (current) use of systemic steroids: Secondary | ICD-10-CM | POA: Insufficient documentation

## 2017-06-15 DIAGNOSIS — Z9660 Presence of unspecified orthopedic joint implant: Secondary | ICD-10-CM | POA: Insufficient documentation

## 2017-06-15 DIAGNOSIS — I129 Hypertensive chronic kidney disease with stage 1 through stage 4 chronic kidney disease, or unspecified chronic kidney disease: Secondary | ICD-10-CM | POA: Insufficient documentation

## 2017-06-15 DIAGNOSIS — R0602 Shortness of breath: Secondary | ICD-10-CM | POA: Diagnosis present

## 2017-06-15 DIAGNOSIS — N189 Chronic kidney disease, unspecified: Secondary | ICD-10-CM | POA: Diagnosis not present

## 2017-06-15 DIAGNOSIS — Z87891 Personal history of nicotine dependence: Secondary | ICD-10-CM | POA: Diagnosis not present

## 2017-06-15 DIAGNOSIS — J189 Pneumonia, unspecified organism: Secondary | ICD-10-CM

## 2017-06-15 LAB — COMPREHENSIVE METABOLIC PANEL
ALT: 14 U/L (ref 14–54)
ANION GAP: 6 (ref 5–15)
AST: 16 U/L (ref 15–41)
Albumin: 4.1 g/dL (ref 3.5–5.0)
Alkaline Phosphatase: 93 U/L (ref 38–126)
BUN: 25 mg/dL — ABNORMAL HIGH (ref 6–20)
CHLORIDE: 98 mmol/L — AB (ref 101–111)
CO2: 31 mmol/L (ref 22–32)
Calcium: 8.8 mg/dL — ABNORMAL LOW (ref 8.9–10.3)
Creatinine, Ser: 1.03 mg/dL — ABNORMAL HIGH (ref 0.44–1.00)
GFR calc non Af Amer: 52 mL/min — ABNORMAL LOW (ref >60)
Glucose, Bld: 107 mg/dL — ABNORMAL HIGH (ref 65–99)
POTASSIUM: 4.5 mmol/L (ref 3.5–5.1)
SODIUM: 135 mmol/L (ref 135–145)
Total Bilirubin: 0.6 mg/dL (ref 0.3–1.2)
Total Protein: 6.9 g/dL (ref 6.5–8.1)

## 2017-06-15 LAB — CBC
HCT: 37.5 % (ref 35.0–47.0)
Hemoglobin: 12.6 g/dL (ref 12.0–16.0)
MCH: 31.5 pg (ref 26.0–34.0)
MCHC: 33.6 g/dL (ref 32.0–36.0)
MCV: 93.8 fL (ref 80.0–100.0)
PLATELETS: 202 10*3/uL (ref 150–440)
RBC: 4 MIL/uL (ref 3.80–5.20)
RDW: 14.3 % (ref 11.5–14.5)
WBC: 12 10*3/uL — AB (ref 3.6–11.0)

## 2017-06-15 LAB — TROPONIN I

## 2017-06-15 LAB — BRAIN NATRIURETIC PEPTIDE: B Natriuretic Peptide: 159 pg/mL — ABNORMAL HIGH (ref 0.0–100.0)

## 2017-06-15 MED ORDER — IPRATROPIUM-ALBUTEROL 0.5-2.5 (3) MG/3ML IN SOLN
3.0000 mL | Freq: Once | RESPIRATORY_TRACT | Status: AC
  Administered 2017-06-15: 3 mL via RESPIRATORY_TRACT
  Filled 2017-06-15: qty 3

## 2017-06-15 MED ORDER — PREDNISONE 20 MG PO TABS: 60.0000 mg | ORAL_TABLET | Freq: Every day | ORAL | 0 refills | Status: AC

## 2017-06-15 MED ORDER — AMOXICILLIN-POT CLAVULANATE 875-125 MG PO TABS
1.0000 | ORAL_TABLET | Freq: Two times a day (BID) | ORAL | 0 refills | Status: AC
Start: 2017-06-15 — End: 2017-06-25

## 2017-06-15 MED ORDER — ALBUTEROL SULFATE HFA 108 (90 BASE) MCG/ACT IN AERS: 2.0000 | INHALATION_SPRAY | Freq: Four times a day (QID) | RESPIRATORY_TRACT | 2 refills | Status: DC | PRN

## 2017-06-15 MED ORDER — DOXYCYCLINE HYCLATE 100 MG PO CAPS: 100.0000 mg | ORAL_CAPSULE | Freq: Two times a day (BID) | ORAL | 0 refills | Status: AC

## 2017-06-15 MED ORDER — DEXTROSE 5 % IV SOLN
1.0000 g | Freq: Once | INTRAVENOUS | Status: AC
  Administered 2017-06-15: 1 g via INTRAVENOUS
  Filled 2017-06-15: qty 10

## 2017-06-15 MED ORDER — DOXYCYCLINE HYCLATE 100 MG PO TABS
100.0000 mg | ORAL_TABLET | Freq: Once | ORAL | Status: AC
  Administered 2017-06-15: 100 mg via ORAL
  Filled 2017-06-15: qty 1

## 2017-06-15 MED ORDER — FUROSEMIDE 10 MG/ML IJ SOLN
20.0000 mg | Freq: Once | INTRAMUSCULAR | Status: AC
  Administered 2017-06-15: 20 mg via INTRAVENOUS
  Filled 2017-06-15: qty 4

## 2017-06-15 MED ORDER — METHYLPREDNISOLONE SODIUM SUCC 125 MG IJ SOLR
125.0000 mg | Freq: Once | INTRAMUSCULAR | Status: AC
  Administered 2017-06-15: 125 mg via INTRAVENOUS
  Filled 2017-06-15: qty 2

## 2017-06-15 NOTE — ED Triage Notes (Signed)
Pt presents to ED via [facility] from home c/o shortness of breath with cough productive of green/brown sputum x2 days. Seen by PCP last week for PNA screen which was negative at that time. Hx COPD, CHF, emphysema. rhonchi present bil. Pt wears 2L o2 chronically at home. Sat 97% on 2L.

## 2017-06-15 NOTE — ED Notes (Signed)
D&C IV 

## 2017-06-15 NOTE — ED Notes (Signed)

## 2017-06-15 NOTE — ED Notes (Signed)
Put patient on bedpan. 

## 2017-06-15 NOTE — ED Provider Notes (Signed)
[facility] Emergency Department Provider Note  ____________________________________________  Time seen: Approximately 11:37 AM  I have reviewed the triage vital signs and the nursing notes.   HISTORY  Chief Complaint Shortness of Breath   HPI Sara Pierce is a 76 y.o. female with h/o HFpEF, COPD on 2 L [place], CKD, OSA, HTN who presents for evaluation of shortness of breath. Patient endorses 3 days of cough productive of green/brown phlegm, progressively worsening shortness of breath. No need for increased oxygen requirement. No fever or chills, no nausea or vomiting, no diarrhea, no body aches. She currently endorses mild shortness of breath at rest which becomes moderate with minimal exertion. She is complaining of chest tightness which improves with her breathing treatments. No chest pain. Her symptoms have been constant and progressively worse. She also noted weight gain. She reports that her dry weight is 235 pounds. She is on 60 mg daily of Lasix. She endorses compliance with her medications.  Past Medical History:  Diagnosis Date  . Arthritis   . Cardiomyopathy ([place])   . Cataract   . Cerebral hemorrhage ([place])   . Chronic kidney disease   . Clotting disorder ([place])   . COPD (chronic obstructive pulmonary disease) ([place])   . Depression   . Diabetes mellitus without complication ([place])   . Hypertension   . Mixed incontinence   . Obesity   . Sleep apnea   . Thyroid disease   . Urinary frequency   . Vaginal atrophy   . Varicose veins     Patient Active Problem List   Diagnosis Date Noted  . CAP (community acquired pneumonia) 10/03/2016  . Sepsis ([place]) 11/15/2015  . Community acquired pneumonia 11/15/2015  . Absolute anemia 03/15/2015  . Anxiety 03/15/2015  . Arthritis 03/15/2015  . Cardiomyopathy, secondary ([place]) 03/15/2015  . Cataract 03/15/2015  . Brain bleed ([place]) 03/15/2015  . Chronic constipation 03/15/2015  . Chronic kidney failure  03/15/2015  . Blood clotting disorder ([place]) 03/15/2015  . CAFL (chronic airflow limitation) ([place]) 03/15/2015  . Clinical depression 03/15/2015  . Diabetes ([place]) 03/15/2015  . H/O varicella 03/15/2015  . BP (high blood pressure) 03/15/2015  . Mixed incontinence 03/15/2015  . 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 Adiposity 03/15/2015  . Apnea, sleep 03/15/2015  . Disease of thyroid gland 03/15/2015  . FOM (frequency of micturition) 03/15/2015  . 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 Candida vaginitis 03/15/2015  . Abnormal ECG 03/15/2015    Past Surgical History:  Procedure Laterality Date  . APPENDECTOMY    . Cardiac Catherization    . COLONOSCOPY WITH PROPOFOL N/A 04/28/2016   Procedure: COLONOSCOPY WITH PROPOFOL;  Surgeon: Manya Silvas, MD;  Location: [facility] ENDOSCOPY;  Service: Endoscopy;  Laterality: N/A;  . COLONOSCOPY WITH PROPOFOL N/A 04/29/2016   Procedure: COLONOSCOPY WITH PROPOFOL;  Surgeon: Manya Silvas, MD;  Location: [facility] ENDOSCOPY;  Service: Endoscopy;  Laterality: N/A;  . JOINT REPLACEMENT    . STRABISMUS SURGERY    . TONSILLECTOMY    . TUBAL LIGATION      Prior to Admission medications   Medication Sig Start Date End Date Taking? Authorizing Provider  albuterol (PROVENTIL HFA;VENTOLIN HFA) 108 (90 BASE) MCG/ACT inhaler Inhale 2 puffs into the lungs every 6 (six) hours as needed for wheezing.     [provider]  albuterol (PROVENTIL HFA;VENTOLIN HFA) 108 (90 Base) MCG/ACT inhaler Inhale 2 puffs into the lungs every 6 (six) hours  as needed for wheezing or shortness of breath. 06/15/17   Rudene Re, MD  amoxicillin-clavulanate (AUGMENTIN) 875-125 MG tablet Take 1 tablet by mouth 2 (two) times daily. 06/15/17 06/25/17  Rudene Re, MD  benzonatate (TESSALON) 200 MG capsule Take 200 mg by mouth 3 (three) times daily as needed for cough.    [provider]  buPROPion (WELLBUTRIN XL) 300 MG 24 hr tablet  Take 300 mg by mouth daily.     [provider]  carvedilol (COREG) 6.25 MG tablet Take 6.25 mg by mouth 2 (two) times daily.     [provider]  clonazePAM (KLONOPIN) 0.5 MG tablet Take 1 tablet (0.5 mg total) by mouth 3 (three) times daily as needed (Anxiety). 11/19/15   Gladstone Lighter, MD  clotrimazole (LOTRIMIN) 1 % cream Apply 1 application topically 2 (two) times daily.    [provider]  clotrimazole-betamethasone (LOTRISONE) cream Apply 1 application topically 2 (two) times daily.    [provider]  docusate sodium (COLACE) 100 MG capsule Take 1 capsule (100 mg total) by mouth 2 (two) times daily as needed for mild constipation. 10/04/16   Nicholes Mango, MD  doxycycline (VIBRAMYCIN) 100 MG capsule Take 1 capsule (100 mg total) by mouth 2 (two) times daily. 06/15/17 06/25/17  Rudene Re, MD  estradiol (ESTRACE) 0.1 MG/GM vaginal cream Place 1 Applicatorful vaginally 3 (three) times a week. Apply a pea-sized amount with tip of finger on Monday, Wednesday, and Friday night    [provider]  FLUoxetine (PROZAC) 40 MG capsule Take 40 mg by mouth daily.    [provider]  Fluticasone-Salmeterol (ADVAIR) 500-50 MCG/DOSE AEPB Inhale 1 puff into the lungs 2 (two) times daily.    [provider]  guaiFENesin-dextromethorphan (ROBITUSSIN DM) 100-10 MG/5ML syrup Take 10 mLs by mouth every 6 (six) hours as needed for cough. 10/04/16   Nicholes Mango, MD  hydrocortisone cream 1 % Apply 1 application topically 2 (two) times daily as needed for itching.    [provider]  ipratropium-albuterol (DUONEB) 0.5-2.5 (3) MG/3ML SOLN Inhale 3 mLs into the lungs 4 (four) times daily as needed (wheezing).     [provider]  levofloxacin (LEVAQUIN) 500 MG tablet Take 1 tablet (500 mg total) by mouth daily. 10/05/16   Nicholes Mango, MD  liothyronine (CYTOMEL) 25 MCG tablet Take 25 mcg by mouth daily.     [provider]    magnesium oxide (MAG-OX) 400 MG tablet Take 400 mg by mouth daily.     [provider]  montelukast (SINGULAIR) 10 MG tablet Take 10 mg by mouth at bedtime.     [provider]  nystatin (MYCOSTATIN/NYSTOP) 100000 UNIT/GM POWD Apply topically 2 (two) times daily as needed (rash). 1 application    [provider]  oxyCODONE-acetaminophen (ROXICET) 5-325 MG per tablet Take 1 tablet by mouth every 6 (six) hours as needed. 06/05/15   Earleen Newport, MD  potassium chloride (K-DUR) 10 MEQ tablet Take 10 mEq by mouth daily.     [provider]  predniSONE (DELTASONE) 20 MG tablet Take 3 tablets (60 mg total) by mouth daily. 06/15/17 06/19/17  Rudene Re, MD  primidone (MYSOLINE) 50 MG tablet Take 50 mg by mouth 2 (two) times daily.     [provider]  simvastatin (ZOCOR) 40 MG tablet Take 40 mg by mouth at bedtime.     [provider]  sodium chloride (OCEAN) 0.65 % SOLN nasal spray Place 2  sprays into both nostrils 3 (three) times daily as needed for congestion. 10/04/16   Gouru, Illene Silver, MD  solifenacin (VESICARE) 10 MG tablet Take 10 mg by mouth daily.    [provider]  spironolactone (ALDACTONE) 25 MG tablet Take 1 tablet (25 mg total) by mouth daily. 11/19/15   Gladstone Lighter, MD  tiotropium (SPIRIVA) 18 MCG inhalation capsule Place 18 mcg into inhaler and inhale daily.     [provider]  torsemide (DEMADEX) 20 MG tablet Take 1 tablet (20 mg total) by mouth 2 (two) times daily. 10/04/16   Nicholes Mango, MD  traZODone (DESYREL) 50 MG tablet Take 50 mg by mouth at bedtime as needed for sleep.    [provider]  triamcinolone (KENALOG) 0.025 % cream Apply 1 application topically as needed (itchiness).    [provider]  Vitamin D, Ergocalciferol, (DRISDOL) 50000 UNITS CAPS capsule Take 50,000 Units by mouth every 30 (thirty) days. On the first of each month    [provider]     Allergies Aspirin; Codeine; and Tape  Family History  Problem Relation Age of Onset  . Heart attack Father   . Coronary artery disease Father   . Hypertension Father   . Hyperlipidemia Father   . Heart attack Mother   . Hypertension Mother   . Hyperlipidemia Mother   . Breast cancer Neg Hx     Social History Social History  Substance Use Topics  . Smoking status: Former Smoker    Packs/day: 0.50    Years: 44.00    Types: Cigarettes  . Smokeless tobacco: Never Used  . Alcohol use No    Review of Systems  Constitutional: Negative for fever. Eyes: Negative for visual changes. ENT: Negative for sore throat. Neck: No neck pain  Cardiovascular: Negative for chest pain. Respiratory: + shortness of breath, productive cough Gastrointestinal: Negative for abdominal pain, vomiting or diarrhea. Genitourinary: Negative for dysuria. Musculoskeletal: Negative for back pain. + Bilateral lower extremity edema Skin: Negative for rash. Neurological: Negative for headaches, weakness or numbness. Psych: No SI or HI  ____________________________________________   PHYSICAL EXAM:  VITAL SIGNS: ED Triage Vitals [06/15/17 1124]  Enc Vitals Group     BP (!) 127/48     Pulse Rate 71     Resp 16     Temp 98.5 F (36.9 C)     Temp Source Oral     SpO2 97 %     Weight 254 lb 1.6 oz (115.3 kg)     Height 5\' 5"  (1.651 m)     Head Circumference      Peak Flow      Pain Score      Pain Loc      Pain Edu?      Excl. in [place]?     Constitutional: Alert and oriented. Well appearing and in no apparent distress. HEENT:      Head: Normocephalic and atraumatic.         Eyes: Conjunctivae are normal. Sclera is non-icteric.       Mouth/Throat: Mucous membranes are moist.       Neck: Supple with no signs of meningismus. Cardiovascular: Regular rate and rhythm. No murmurs, gallops, or rubs. 2+ symmetrical distal pulses are present in all extremities. No JVD. Respiratory: Normal  respiratory effort. Satting well on 2 L nasal cannula, diffuse coarse rhonchi bilaterally with good air movement  Gastrointestinal: Soft, non tender, and non distended with positive bowel sounds. No rebound  or guarding. Musculoskeletal: 2+ pitting edema bilateral lower extremities  Neurologic: Normal speech and language. Face is symmetric. Moving all extremities. No gross focal neurologic deficits are appreciated. Skin: Skin is warm, dry and intact. No rash noted. Psychiatric: Mood and affect are normal. Speech and behavior are normal.  ____________________________________________   LABS (all labs ordered are listed, but only abnormal results are displayed)  Labs Reviewed  CBC - Abnormal; Notable for the following:       Result Value   WBC 12.0 (*)    All other components within normal limits  COMPREHENSIVE METABOLIC PANEL - Abnormal; Notable for the following:    Chloride 98 (*)    Glucose, Bld 107 (*)    BUN 25 (*)    Creatinine, Ser 1.03 (*)    Calcium 8.8 (*)    GFR calc non Af Amer 52 (*)    All other components within normal limits  BRAIN NATRIURETIC PEPTIDE - Abnormal; Notable for the following:    B Natriuretic Peptide 159.0 (*)    All other components within normal limits  TROPONIN I   ____________________________________________  EKG   ED ECG REPORT I, Rudene Re, the attending physician, personally viewed and interpreted this ECG.   Junctional rhythm, rate of 72, RBBB, normal QTc interval, normal axis, no STE or depression. RBBB is old but junctional rhythm is new when compared to prior from 10/60 ____________________________________________  RADIOLOGY  CXR: Patchy consolidation left lung base may represent atelectasis or infection. ____________________________________________   PROCEDURES  Procedure(s) performed: None Procedures Critical Care performed:  None ____________________________________________   INITIAL IMPRESSION / ASSESSMENT AND  PLAN / ED COURSE  76 y.o. female with h/o HFpEF, COPD on 2 L [place], CKD, OSA, HTN who presents for evaluation of shortness of breath, productive cough, weight gain over the course of the last 2-3 days. Patient has normal work of breathing, normal sats on her baseline 2 L, she is afebrile. Normal vital signs. Chest coarse rhonchi bilaterally with good air movement. Weight here is 254lbs and according to patient her dry weight should be 135lb. However BP in the low 100s which is her baseline, so will give small amount of IV lasix. Will check for PNA. Will give duoneb and solumedrol.     _________________________ 1:12 PM on 06/15/2017 -----------------------------------------  Chest x-ray concerning for a left lung base infiltrate with mild leukocytosis. Patient remains well appearing. She was given Rocephin and doxycycline and will be discharged home on Augmentin and doxycycline. Also slightly elevated BNP but no evidence of pulmonary edema. Due to patient's weight gain patient was given 20 mg of IV Lasix. I have requested a close follow-up with the CHF clinic in 24 hours for recheck. Recommended the patient returns to the emergency room she has a fever, worsening shortness of breath, or any other symptoms concerning to her. Patient will be discharged home at this time.  Pertinent labs & imaging results that were available during my care of the patient were reviewed by me and considered in my medical decision making (see chart for details).    ____________________________________________   FINAL CLINICAL IMPRESSION(S) / ED DIAGNOSES  Final diagnoses:  Community acquired pneumonia of left lower lobe of lung ([place])  COPD exacerbation ([facility])      NEW MEDICATIONS STARTED DURING THIS VISIT:  New Prescriptions   ALBUTEROL (PROVENTIL HFA;VENTOLIN HFA) 108 (90 BASE) MCG/ACT INHALER    Inhale 2 puffs into the lungs every 6 (six) hours as needed for wheezing  or shortness of breath.    AMOXICILLIN-CLAVULANATE (AUGMENTIN) 875-125 MG TABLET    Take 1 tablet by mouth 2 (two) times daily.   DOXYCYCLINE (VIBRAMYCIN) 100 MG CAPSULE    Take 1 capsule (100 mg total) by mouth 2 (two) times daily.   PREDNISONE (DELTASONE) 20 MG TABLET    Take 3 tablets (60 mg total) by mouth daily.     Note:  This document was prepared using Dragon voice recognition software and may include unintentional dictation errors.    Alfred Levins, [place], MD 06/15/17 [phone]

## 2017-06-24 IMAGING — CR DG CHEST 2V
1 series · 3 of 3 positions shown · non-contrast
Comparison: 09/14/2015.

CLINICAL DATA: Productive cough for the past 3-4 days.

EXAM:
CHEST  2 VIEW

[Series 1: dg chest 2 view · 0.14mm/px · 3 of 3 slices shown]
[im 1/3]
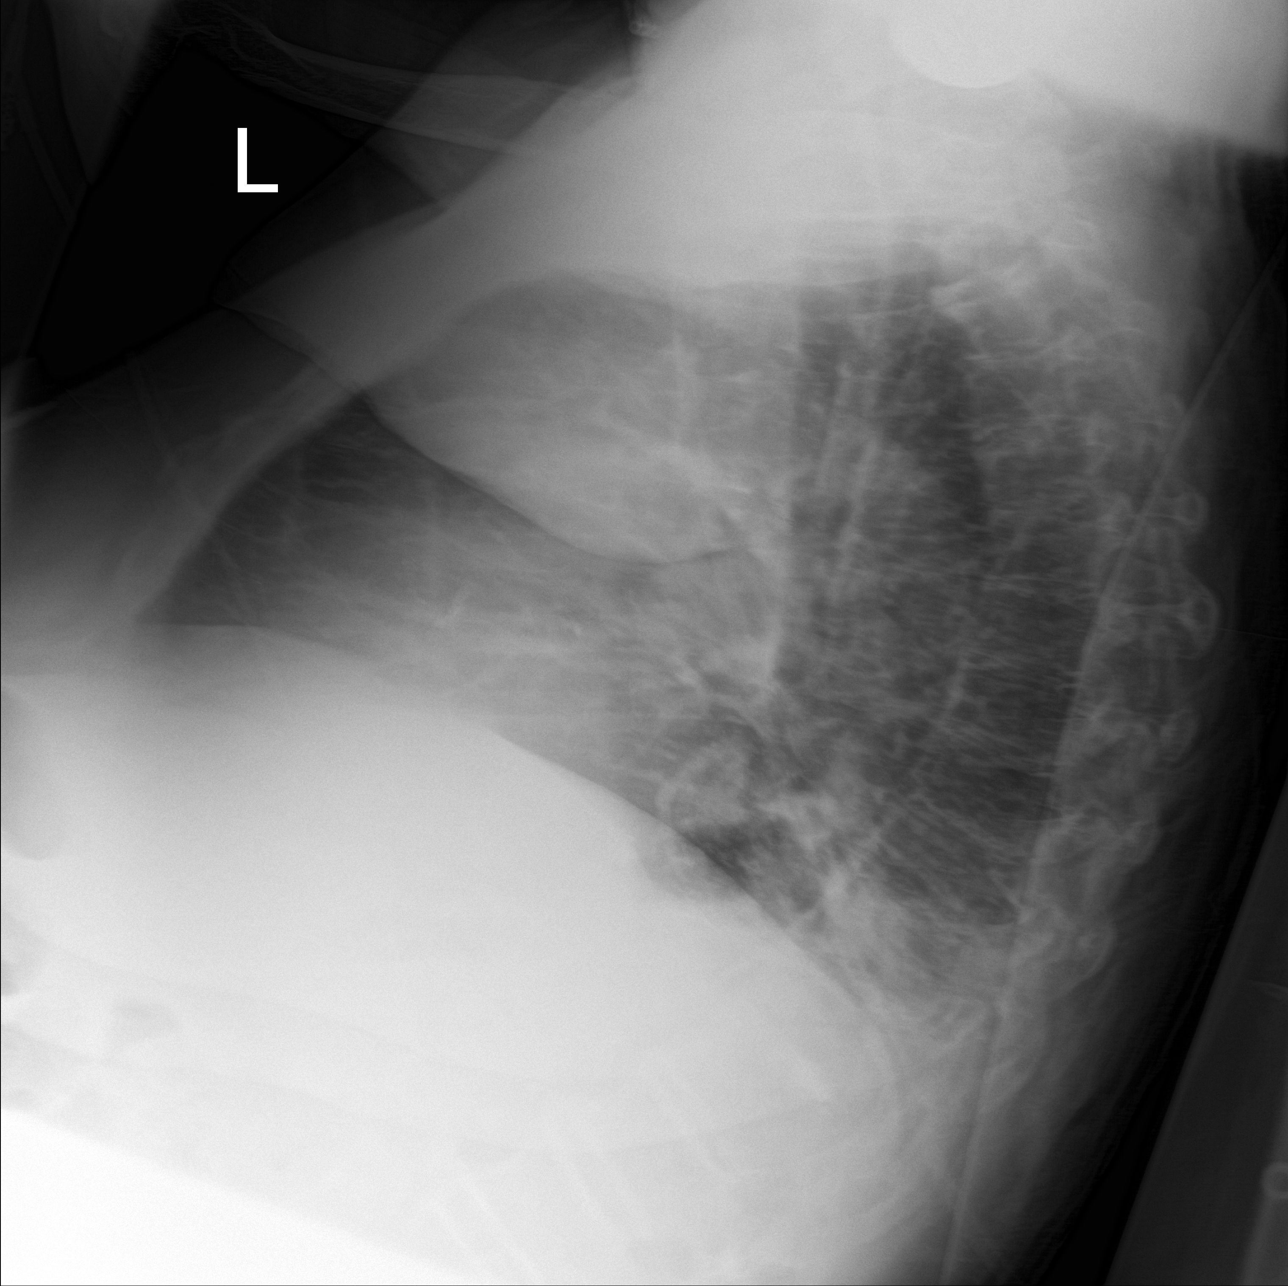
[im 2/3]
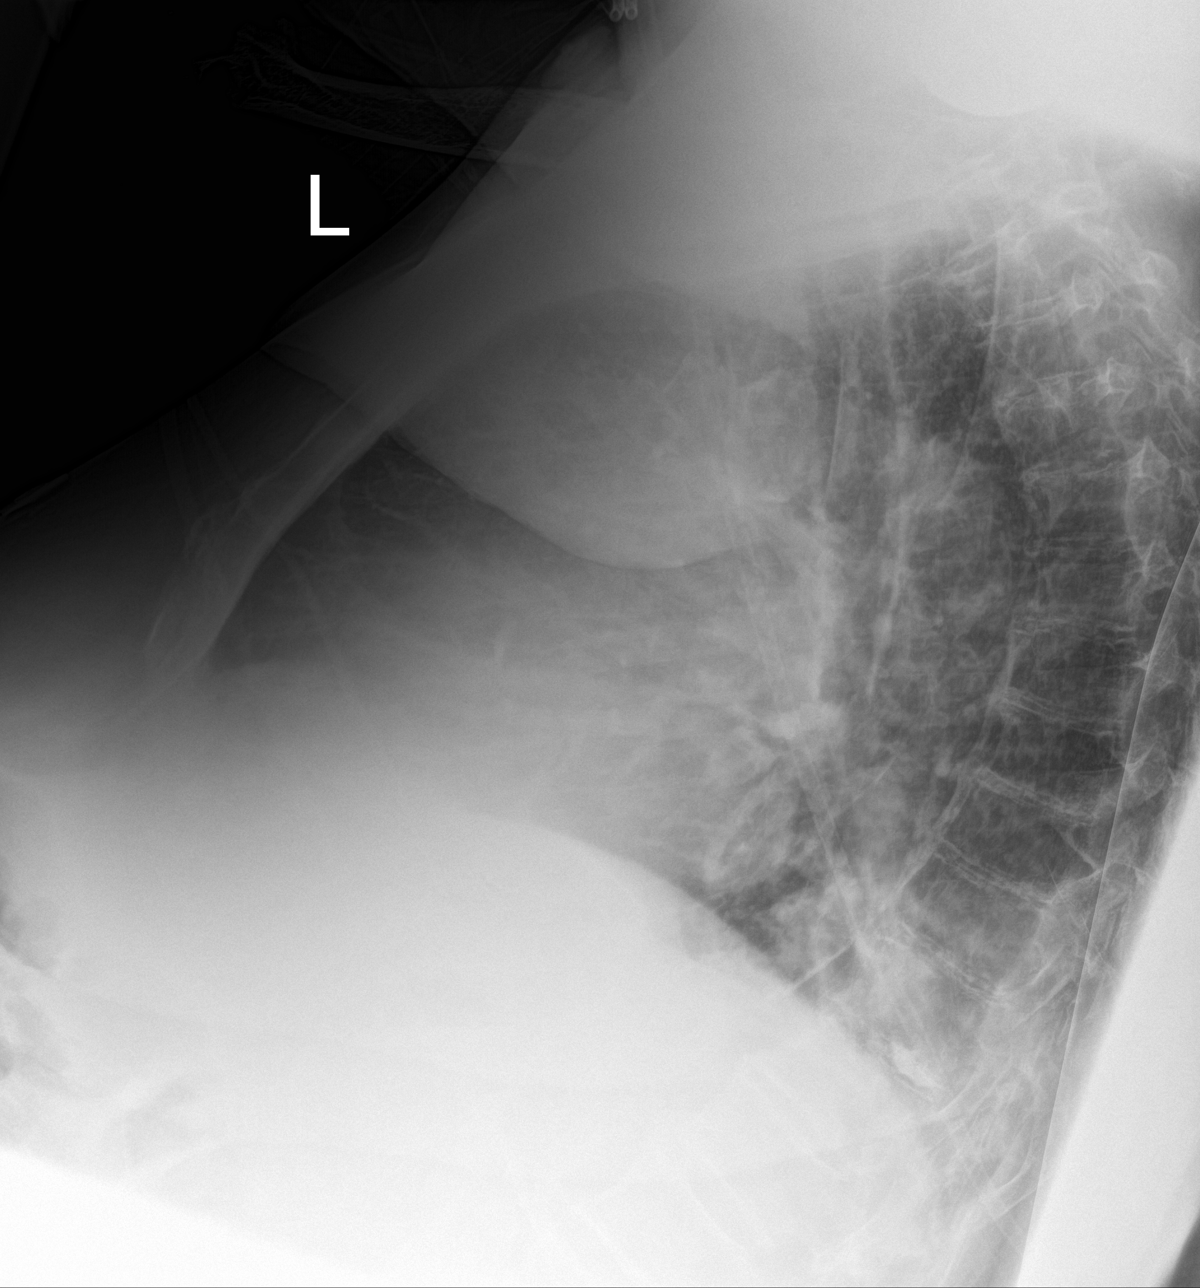
[im 3/3]
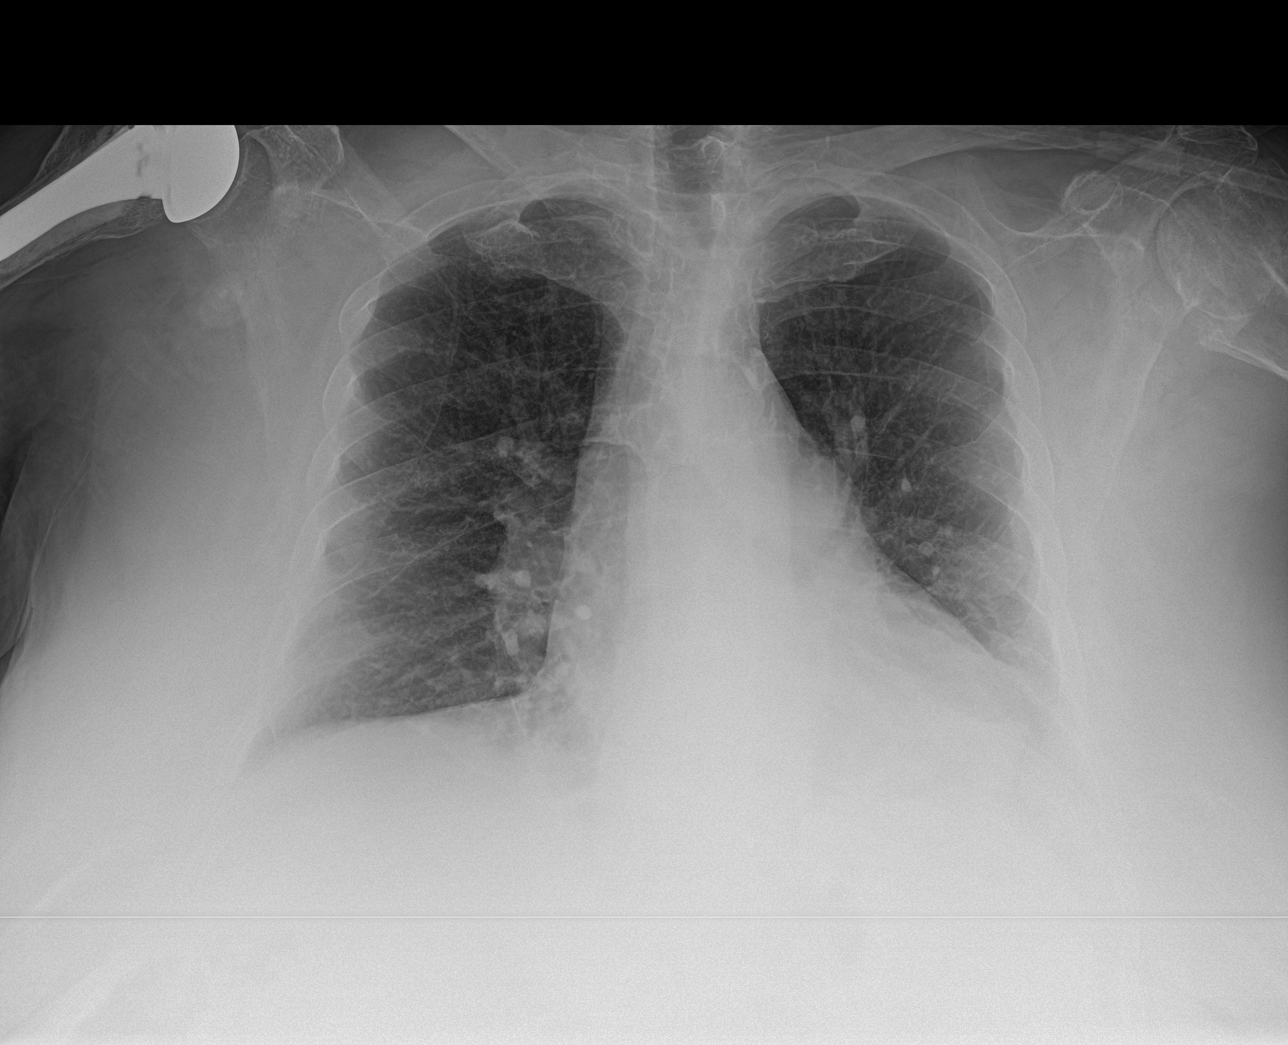

[3 of 3 positions shown; findings below may reference images not displayed]

FINDINGS: The cardiac silhouette remains mildly enlarged. Clear lungs with
normal vascularity. The interstitial markings are mildly prominent.
Interval small amount of airspace opacity in the posterior aspect of
the left lower lobe, medially. A right humeral head prosthesis is
again demonstrated as well as an old, healed left humeral neck
fracture. Diffuse osteopenia.
IMPRESSION: 1. Left lower lobe atelectasis or pneumonia.
2. Mild chronic interstitial lung disease.

## 2017-06-27 IMAGING — CR DG CHEST 2V
2 series · 2 of 2 positions shown · non-contrast
Comparison: November 15, 2015.

CLINICAL DATA: Pneumonia.

EXAM:
CHEST  2 VIEW

[chest pa]
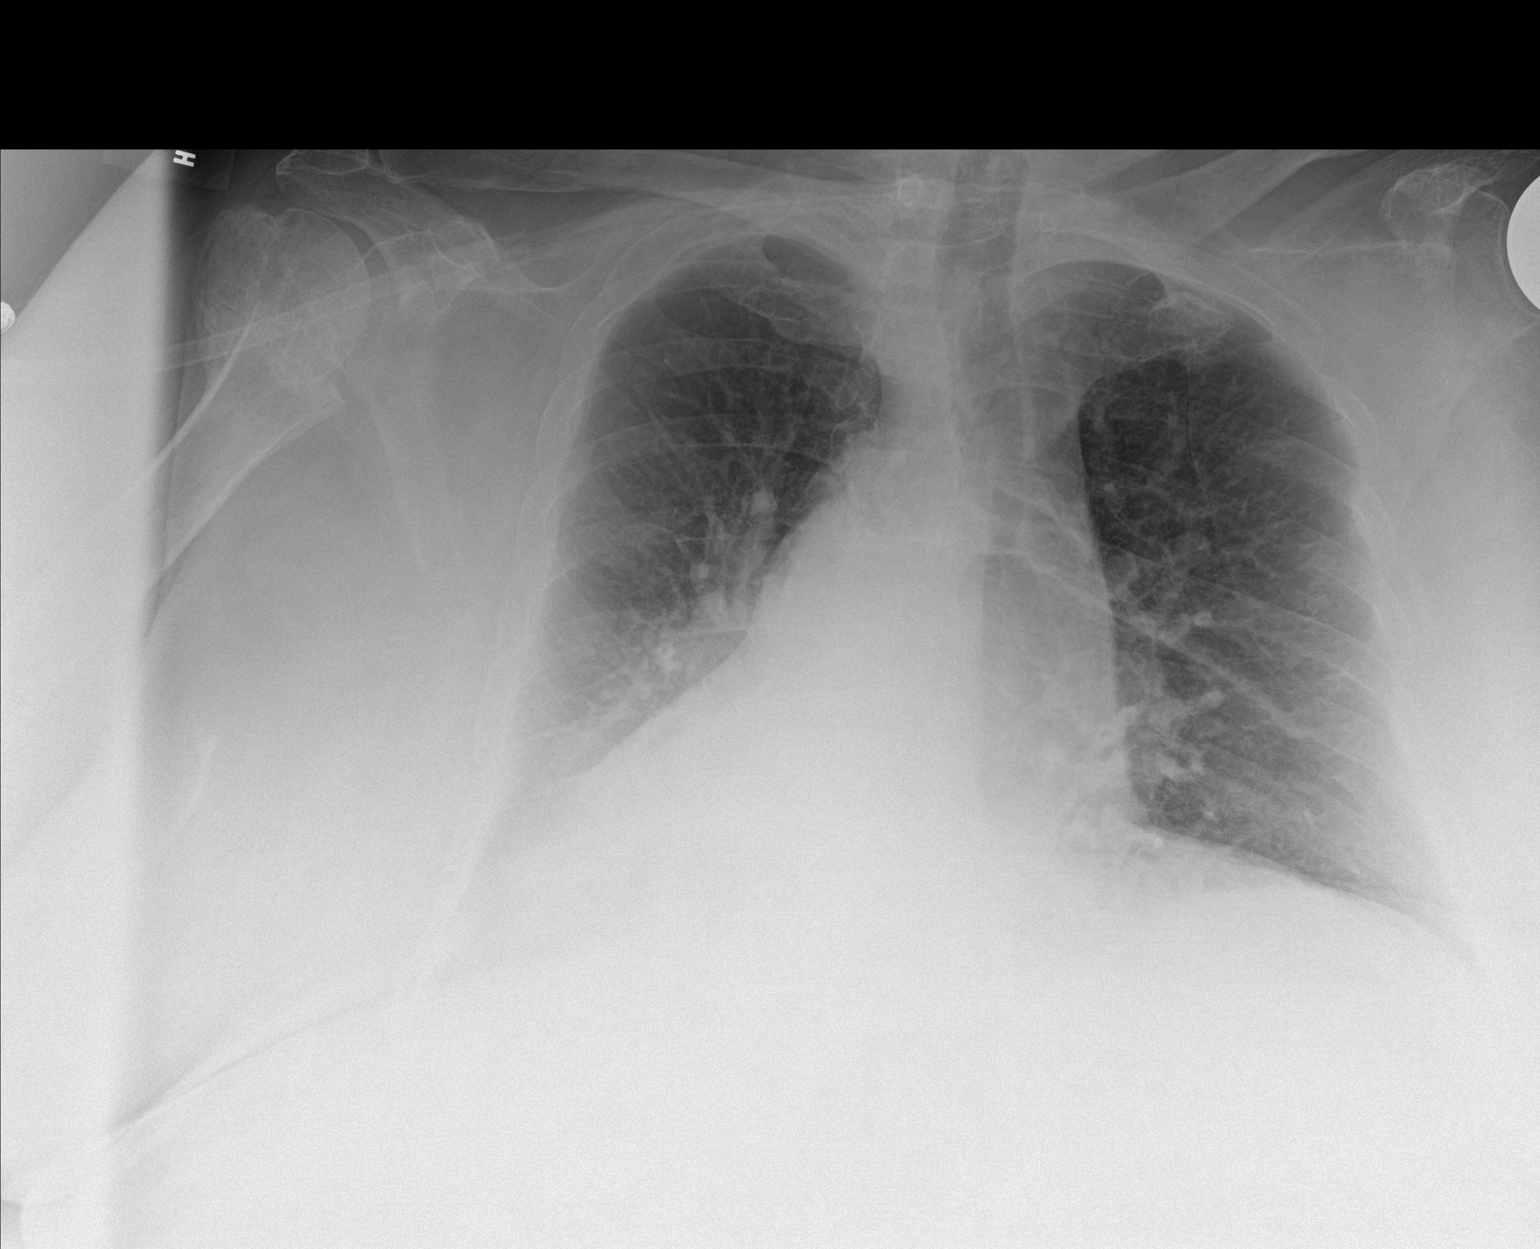

[chest lat]
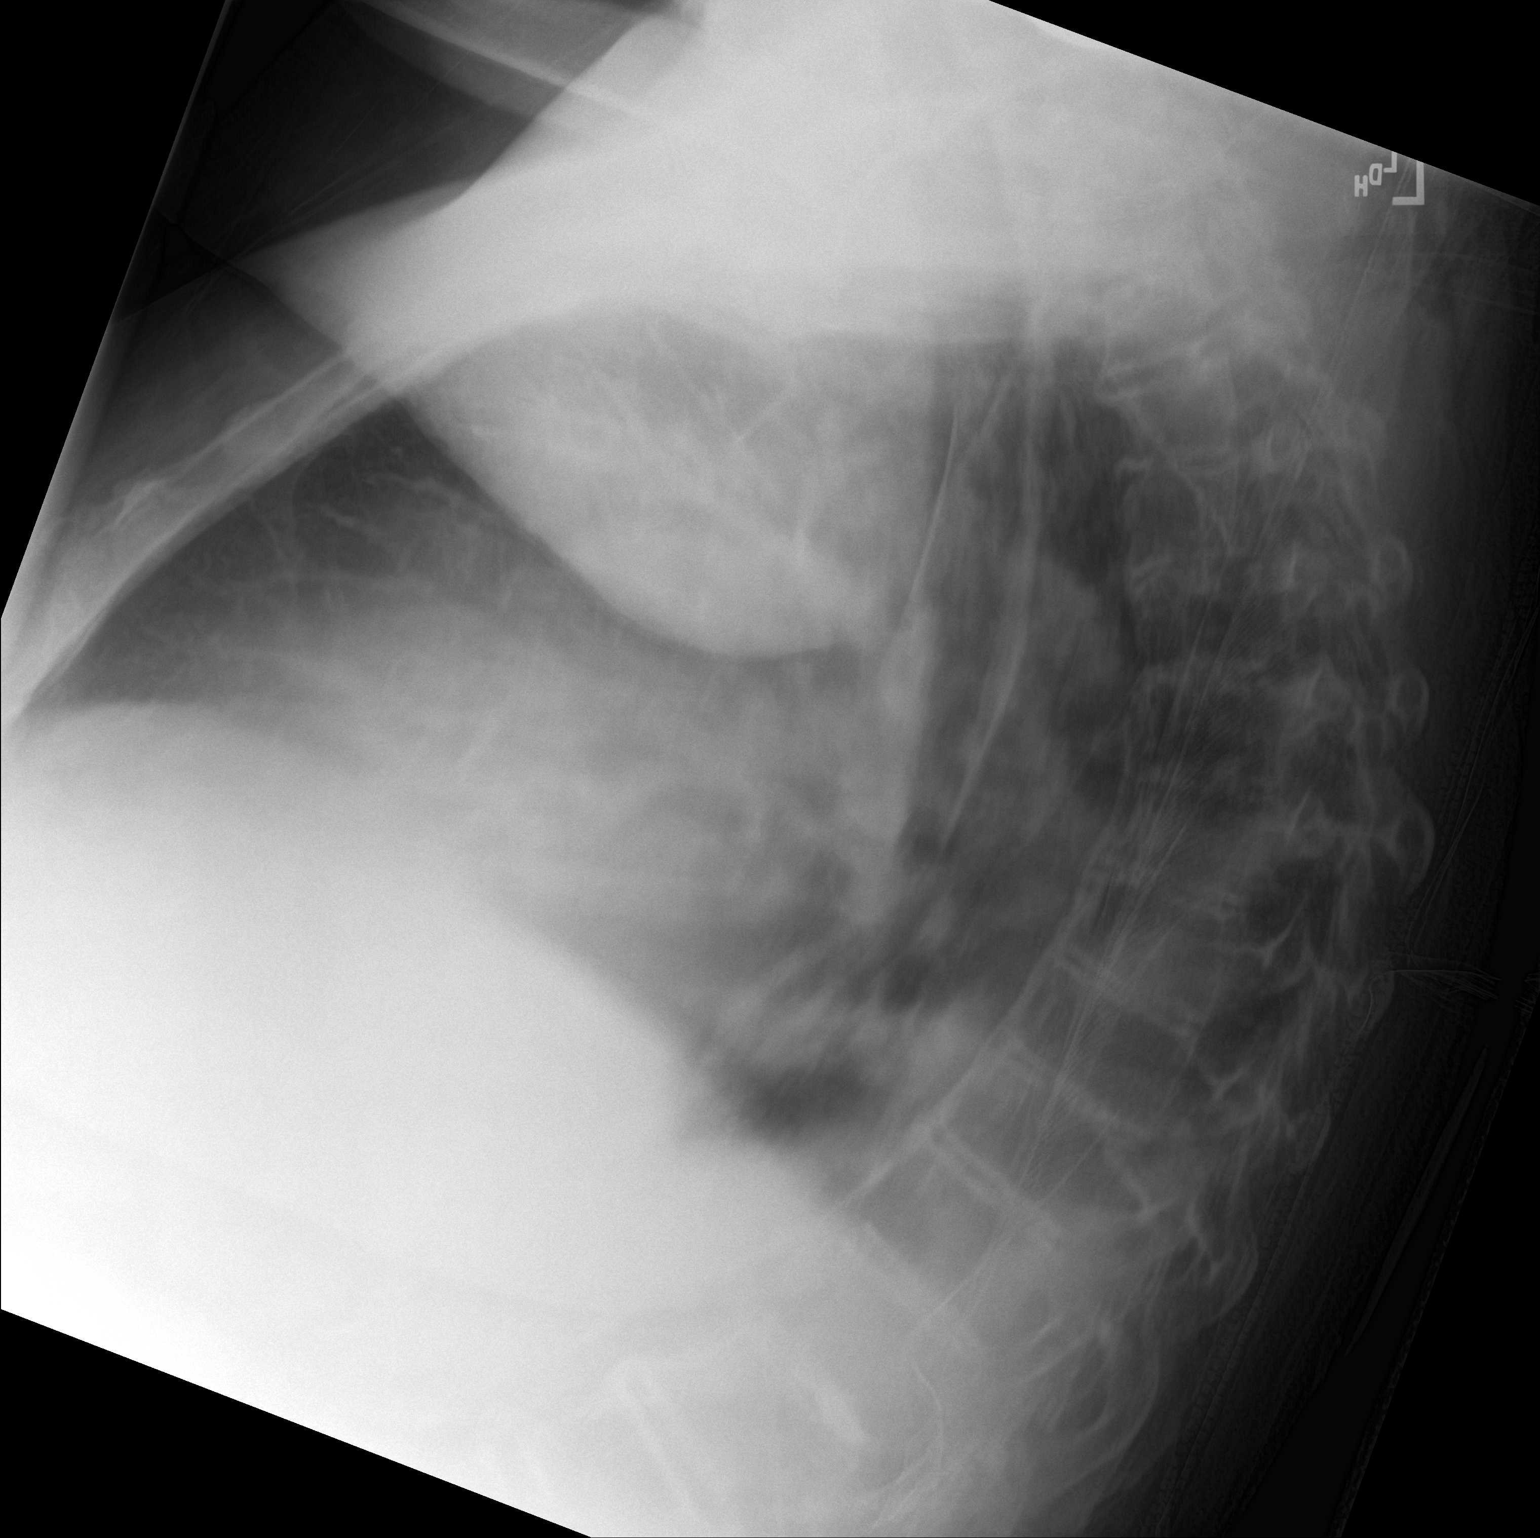

[2 of 2 positions shown; findings below may reference images not displayed]

FINDINGS: Stable cardiomegaly. No pneumothorax is noted. Right lung is clear.
Mild left basilar opacity is noted concerning for pneumonia or
atelectasis with possible small associated pleural effusion. Old
proximal left humeral fracture is noted. Status post right shoulder
arthroplasty.
IMPRESSION: Mild left basilar opacity is noted concerning for pneumonia or
atelectasis with possible small associated pleural effusion.

## 2017-08-12 ENCOUNTER — Emergency Department: Payer: Medicare (Managed Care)

## 2017-08-12 ENCOUNTER — Encounter: Payer: Self-pay | Admitting: Emergency Medicine

## 2017-08-12 ENCOUNTER — Emergency Department
Admission: EM | Admit: 2017-08-12 | Discharge: 2017-08-12 | Disposition: A | Discharge status: Other Acute Inpatient Hospital | Payer: Medicare (Managed Care) | Attending: Student in an Organized Health Care Education/Training Program | Admitting: Student in an Organized Health Care Education/Training Program

## 2017-08-12 DIAGNOSIS — S42492B Other displaced fracture of lower end of left humerus, initial encounter for open fracture: Secondary | ICD-10-CM | POA: Diagnosis not present

## 2017-08-12 DIAGNOSIS — J449 Chronic obstructive pulmonary disease, unspecified: Secondary | ICD-10-CM | POA: Insufficient documentation

## 2017-08-12 DIAGNOSIS — W19XXXA Unspecified fall, initial encounter: Secondary | ICD-10-CM | POA: Insufficient documentation

## 2017-08-12 DIAGNOSIS — Z8679 Personal history of other diseases of the circulatory system: Secondary | ICD-10-CM | POA: Insufficient documentation

## 2017-08-12 DIAGNOSIS — Z79899 Other long term (current) drug therapy: Secondary | ICD-10-CM | POA: Insufficient documentation

## 2017-08-12 DIAGNOSIS — Y92018 Other place in single-family (private) house as the place of occurrence of the external cause: Secondary | ICD-10-CM | POA: Diagnosis not present

## 2017-08-12 DIAGNOSIS — Y998 Other external cause status: Secondary | ICD-10-CM | POA: Insufficient documentation

## 2017-08-12 DIAGNOSIS — E079 Disorder of thyroid, unspecified: Secondary | ICD-10-CM | POA: Diagnosis not present

## 2017-08-12 DIAGNOSIS — Z9981 Dependence on supplemental oxygen: Secondary | ICD-10-CM | POA: Diagnosis not present

## 2017-08-12 DIAGNOSIS — E1122 Type 2 diabetes mellitus with diabetic chronic kidney disease: Secondary | ICD-10-CM | POA: Diagnosis not present

## 2017-08-12 DIAGNOSIS — I129 Hypertensive chronic kidney disease with stage 1 through stage 4 chronic kidney disease, or unspecified chronic kidney disease: Secondary | ICD-10-CM | POA: Diagnosis not present

## 2017-08-12 DIAGNOSIS — N189 Chronic kidney disease, unspecified: Secondary | ICD-10-CM | POA: Insufficient documentation

## 2017-08-12 DIAGNOSIS — R4182 Altered mental status, unspecified: Secondary | ICD-10-CM | POA: Insufficient documentation

## 2017-08-12 DIAGNOSIS — Y9301 Activity, walking, marching and hiking: Secondary | ICD-10-CM | POA: Insufficient documentation

## 2017-08-12 DIAGNOSIS — S4992XA Unspecified injury of left shoulder and upper arm, initial encounter: Secondary | ICD-10-CM | POA: Diagnosis present

## 2017-08-12 DIAGNOSIS — Z87891 Personal history of nicotine dependence: Secondary | ICD-10-CM | POA: Insufficient documentation

## 2017-08-12 DIAGNOSIS — Z23 Encounter for immunization: Secondary | ICD-10-CM | POA: Insufficient documentation

## 2017-08-12 LAB — COMPREHENSIVE METABOLIC PANEL
ALK PHOS: 86 U/L (ref 38–126)
ALT: 16 U/L (ref 14–54)
AST: 16 U/L (ref 15–41)
Albumin: 4 g/dL (ref 3.5–5.0)
Anion gap: 9 (ref 5–15)
BUN: 30 mg/dL — ABNORMAL HIGH (ref 6–20)
CALCIUM: 8.9 mg/dL (ref 8.9–10.3)
CHLORIDE: 100 mmol/L — AB (ref 101–111)
CO2: 27 mmol/L (ref 22–32)
Creatinine, Ser: 1.18 mg/dL — ABNORMAL HIGH (ref 0.44–1.00)
GFR calc non Af Amer: 44 mL/min — ABNORMAL LOW (ref >60)
GFR, EST AFRICAN AMERICAN: 51 mL/min — AB (ref >60)
Glucose, Bld: 160 mg/dL — ABNORMAL HIGH (ref 65–99)
Potassium: 4 mmol/L (ref 3.5–5.1)
SODIUM: 136 mmol/L (ref 135–145)
Total Bilirubin: 1.1 mg/dL (ref 0.3–1.2)
Total Protein: 6.8 g/dL (ref 6.5–8.1)

## 2017-08-12 LAB — TROPONIN I: Troponin I: <0.03 ng/mL (ref <0.03)

## 2017-08-12 LAB — CK: CK TOTAL: 45 U/L (ref 38–234)

## 2017-08-12 LAB — CBC WITH DIFFERENTIAL/PLATELET
BASOS ABS: 0 10*3/uL (ref 0–0.1)
Basophils Relative: 0 %
EOS ABS: 0 10*3/uL (ref 0–0.7)
EOS PCT: 0 %
HCT: 35.2 % (ref 35.0–47.0)
Hemoglobin: 12.2 g/dL (ref 12.0–16.0)
Lymphocytes Relative: 10 %
Lymphs Abs: 0.9 10*3/uL — ABNORMAL LOW (ref 1.0–3.6)
MCH: 32 pg (ref 26.0–34.0)
MCHC: 34.6 g/dL (ref 32.0–36.0)
MCV: 92.4 fL (ref 80.0–100.0)
Monocytes Absolute: 0.4 10*3/uL (ref 0.2–0.9)
Monocytes Relative: 4 %
Neutro Abs: 8 10*3/uL — ABNORMAL HIGH (ref 1.4–6.5)
Neutrophils Relative %: 86 %
PLATELETS: 219 10*3/uL (ref 150–440)
RBC: 3.81 MIL/uL (ref 3.80–5.20)
RDW: 14.6 % — ABNORMAL HIGH (ref 11.5–14.5)
WBC: 9.3 10*3/uL (ref 3.6–11.0)

## 2017-08-12 LAB — URINALYSIS, COMPLETE (UACMP) WITH MICROSCOPIC
Bacteria, UA: NONE SEEN
Bilirubin Urine: NEGATIVE
GLUCOSE, UA: NEGATIVE mg/dL
Hgb urine dipstick: NEGATIVE
Ketones, ur: NEGATIVE mg/dL
Leukocytes, UA: NEGATIVE
Nitrite: NEGATIVE
PH: 6 (ref 5.0–8.0)
Protein, ur: NEGATIVE mg/dL
Specific Gravity, Urine: 1.011 (ref 1.005–1.030)
Squamous Epithelial / LPF: NONE SEEN

## 2017-08-12 LAB — TYPE AND SCREEN
ABO/RH(D): O POS
ANTIBODY SCREEN: NEGATIVE

## 2017-08-12 MED ORDER — TETANUS-DIPHTH-ACELL PERTUSSIS 5-2.5-18.5 LF-MCG/0.5 IM SUSP
0.5000 mL | Freq: Once | INTRAMUSCULAR | Status: AC
  Administered 2017-08-12: 0.5 mL via INTRAMUSCULAR
  Filled 2017-08-12: qty 0.5

## 2017-08-12 MED ORDER — IOPAMIDOL (ISOVUE-300) INJECTION 61%
100.0000 mL | Freq: Once | INTRAVENOUS | Status: AC | PRN
  Administered 2017-08-12: 100 mL via INTRAVENOUS

## 2017-08-12 MED ORDER — FENTANYL CITRATE (PF) 100 MCG/2ML IJ SOLN
50.0000 ug | INTRAMUSCULAR | Status: DC | PRN
  Administered 2017-08-12 (×3): 50 ug via INTRAVENOUS
  Filled 2017-08-12 (×2): qty 2

## 2017-08-12 MED ORDER — SODIUM CHLORIDE 0.9 % IV SOLN
3.0000 g | Freq: Once | INTRAVENOUS | Status: AC
  Administered 2017-08-12: 3 g via INTRAVENOUS
  Filled 2017-08-12: qty 3

## 2017-08-12 MED ORDER — SODIUM CHLORIDE 0.9 % IV BOLUS (SEPSIS)
500.0000 mL | Freq: Once | INTRAVENOUS | Status: AC
  Administered 2017-08-12: 500 mL via INTRAVENOUS

## 2017-08-12 MED ORDER — ONDANSETRON HCL 4 MG/2ML IJ SOLN
4.0000 mg | Freq: Once | INTRAMUSCULAR | Status: AC
  Administered 2017-08-12: 4 mg via INTRAVENOUS

## 2017-08-12 MED ORDER — ONDANSETRON HCL 4 MG/2ML IJ SOLN
INTRAMUSCULAR | Status: AC
  Administered 2017-08-12: 4 mg via INTRAVENOUS
  Filled 2017-08-12: qty 2

## 2017-08-12 MED ORDER — FENTANYL CITRATE (PF) 100 MCG/2ML IJ SOLN
INTRAMUSCULAR | Status: AC
  Administered 2017-08-12: 50 ug via INTRAVENOUS
  Filled 2017-08-12: qty 2

## 2017-08-12 NOTE — ED Notes (Signed)
Patient transported to CT 

## 2017-08-12 NOTE — ED Provider Notes (Addendum)
[facility] Emergency Department Provider Note    First MD Initiated Contact with Patient 08/12/17 1512     (approximate)  I have reviewed the triage vital signs and the nursing notes.   HISTORY  Chief Complaint Arm Injury    HPI Sara Pierce is a 76 y.o. female with multiple chronic medical conditions on 2-3 L of nasal cannula at home chronically had a mechanical fall while stepping outside onto the patio. She fell onto her left elbow. Denies any loss of consciousness or head injury. She was unable to get up due to severe pain. She is lying on the ground for 45 minutes until help was able to arrive and get her onto the EMS stretcher. She is denying any chest pain or worsening shortness of breath. No abdominal pain. No recent falls. She is right-hand dominant. Complains of 10 out of 10 pain in the left elbow.  A bone fragment was brought in by EMS.   Past Medical History:  Diagnosis Date  . Arthritis   . Cardiomyopathy ([place])   . Cataract   . Cerebral hemorrhage ([place])   . Chronic kidney disease   . Clotting disorder ([place])   . COPD (chronic obstructive pulmonary disease) ([place])   . Depression   . Diabetes mellitus without complication ([place])   . Hypertension   . Mixed incontinence   . Obesity   . Sleep apnea   . Thyroid disease   . Urinary frequency   . Vaginal atrophy   . Varicose veins    Family History  Problem Relation Age of Onset  . Heart attack Father   . Coronary artery disease Father   . Hypertension Father   . Hyperlipidemia Father   . Heart attack Mother   . Hypertension Mother   . Hyperlipidemia Mother   . Breast cancer Neg Hx    Past Surgical History:  Procedure Laterality Date  . APPENDECTOMY    . Cardiac Catherization    . COLONOSCOPY WITH PROPOFOL N/A 04/28/2016   Procedure: COLONOSCOPY WITH PROPOFOL;  Surgeon: Manya Silvas, MD;  Location: [facility] ENDOSCOPY;  Service: Endoscopy;  Laterality: N/A;  . COLONOSCOPY WITH  PROPOFOL N/A 04/29/2016   Procedure: COLONOSCOPY WITH PROPOFOL;  Surgeon: Manya Silvas, MD;  Location: [facility] ENDOSCOPY;  Service: Endoscopy;  Laterality: N/A;  . JOINT REPLACEMENT    . STRABISMUS SURGERY    . TONSILLECTOMY    . TUBAL LIGATION     Patient Active Problem List   Diagnosis Date Noted  . CAP (community acquired pneumonia) 10/03/2016  . Sepsis ([place]) 11/15/2015  . Community acquired pneumonia 11/15/2015  . Absolute anemia 03/15/2015  . Anxiety 03/15/2015  . Arthritis 03/15/2015  . Cardiomyopathy, secondary ([place]) 03/15/2015  . Cataract 03/15/2015  . Brain bleed ([place]) 03/15/2015  . Chronic constipation 03/15/2015  . Chronic kidney failure 03/15/2015  . Blood clotting disorder ([place]) 03/15/2015  . 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 Clinical depression 03/15/2015  . Diabetes ([place]) 03/15/2015  . H/O varicella 03/15/2015  . BP (high blood pressure) 03/15/2015  . Mixed incontinence 03/15/2015  . 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 Adiposity 03/15/2015  . Apnea, sleep 03/15/2015  . Disease of thyroid gland 03/15/2015  . FOM (frequency of micturition) 03/15/2015  . 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 Candida vaginitis 03/15/2015  . Abnormal ECG 03/15/2015      Prior to Admission medications   Medication Sig  Start Date End Date Taking? Authorizing Provider  albuterol (PROVENTIL HFA;VENTOLIN HFA) 108 (90 BASE) MCG/ACT inhaler Inhale 2 puffs into the lungs every 6 (six) hours as needed for wheezing.     [provider]  albuterol (PROVENTIL HFA;VENTOLIN HFA) 108 (90 Base) MCG/ACT inhaler Inhale 2 puffs into the lungs every 6 (six) hours as needed for wheezing or shortness of breath. 06/15/17   Rudene Re, MD  benzonatate (TESSALON) 200 MG capsule Take 200 mg by mouth 3 (three) times daily as needed for cough.    [provider]  buPROPion (WELLBUTRIN XL) 300 MG 24 hr tablet Take 300 mg by  mouth daily.     [provider]  carvedilol (COREG) 6.25 MG tablet Take 6.25 mg by mouth 2 (two) times daily.     [provider]  clonazePAM (KLONOPIN) 0.5 MG tablet Take 1 tablet (0.5 mg total) by mouth 3 (three) times daily as needed (Anxiety). 11/19/15   Gladstone Lighter, MD  clotrimazole (LOTRIMIN) 1 % cream Apply 1 application topically 2 (two) times daily.    [provider]  clotrimazole-betamethasone (LOTRISONE) cream Apply 1 application topically 2 (two) times daily.    [provider]  docusate sodium (COLACE) 100 MG capsule Take 1 capsule (100 mg total) by mouth 2 (two) times daily as needed for mild constipation. 10/04/16   Gouru, Illene Silver, MD  estradiol (ESTRACE) 0.1 MG/GM vaginal cream Place 1 Applicatorful vaginally 3 (three) times a week. Apply a pea-sized amount with tip of finger on Monday, Wednesday, and Friday night    [provider]  FLUoxetine (PROZAC) 40 MG capsule Take 40 mg by mouth daily.    [provider]  Fluticasone-Salmeterol (ADVAIR) 500-50 MCG/DOSE AEPB Inhale 1 puff into the lungs 2 (two) times daily.    [provider]  guaiFENesin-dextromethorphan (ROBITUSSIN DM) 100-10 MG/5ML syrup Take 10 mLs by mouth every 6 (six) hours as needed for cough. 10/04/16   Nicholes Mango, MD  hydrocortisone cream 1 % Apply 1 application topically 2 (two) times daily as needed for itching.    [provider]  ipratropium-albuterol (DUONEB) 0.5-2.5 (3) MG/3ML SOLN Inhale 3 mLs into the lungs 4 (four) times daily as needed (wheezing).     [provider]  levofloxacin (LEVAQUIN) 500 MG tablet Take 1 tablet (500 mg total) by mouth daily. 10/05/16   Nicholes Mango, MD  liothyronine (CYTOMEL) 25 MCG tablet Take 25 mcg by mouth daily.     [provider]  magnesium oxide (MAG-OX) 400 MG tablet Take 400 mg by mouth daily.     [provider]  montelukast (SINGULAIR) 10 MG tablet Take 10 mg by mouth  at bedtime.     [provider]  nystatin (MYCOSTATIN/NYSTOP) 100000 UNIT/GM POWD Apply topically 2 (two) times daily as needed (rash). 1 application    [provider]  oxyCODONE-acetaminophen (ROXICET) 5-325 MG per tablet Take 1 tablet by mouth every 6 (six) hours as needed. 06/05/15   Earleen Newport, MD  potassium chloride (K-DUR) 10 MEQ tablet Take 10 mEq by mouth daily.     [provider]  primidone (MYSOLINE) 50 MG tablet Take 50 mg by mouth 2 (two) times daily.     [provider]  simvastatin (ZOCOR) 40 MG tablet Take 40 mg by mouth at bedtime.     [provider]  sodium chloride (OCEAN) 0.65 % SOLN nasal spray Place 2 sprays into both nostrils 3 (three) times daily as  needed for congestion. 10/04/16   Gouru, Illene Silver, MD  solifenacin (VESICARE) 10 MG tablet Take 10 mg by mouth daily.    [provider]  spironolactone (ALDACTONE) 25 MG tablet Take 1 tablet (25 mg total) by mouth daily. 11/19/15   Gladstone Lighter, MD  tiotropium (SPIRIVA) 18 MCG inhalation capsule Place 18 mcg into inhaler and inhale daily.     [provider]  torsemide (DEMADEX) 20 MG tablet Take 1 tablet (20 mg total) by mouth 2 (two) times daily. 10/04/16   Nicholes Mango, MD  traZODone (DESYREL) 50 MG tablet Take 50 mg by mouth at bedtime as needed for sleep.    [provider]  triamcinolone (KENALOG) 0.025 % cream Apply 1 application topically as needed (itchiness).    [provider]  Vitamin D, Ergocalciferol, (DRISDOL) 50000 UNITS CAPS capsule Take 50,000 Units by mouth every 30 (thirty) days. On the first of each month    [provider]    Allergies Aspirin; Codeine; and Tape    Social History Social History  Substance Use Topics  . Smoking status: Former Smoker    Packs/day: 0.50    Years: 44.00    Types: Cigarettes  . Smokeless tobacco: Never Used  . Alcohol use No    Review of Systems Patient denies  headaches, rhinorrhea, blurry vision, numbness, shortness of breath, chest pain, edema, cough, abdominal pain, nausea, vomiting, diarrhea, dysuria, fevers, rashes or hallucinations unless otherwise stated above in HPI. ____________________________________________   PHYSICAL EXAM:  VITAL SIGNS: Vitals:   08/12/17 1700 08/12/17 1841  BP: 97/62 (!) 104/52  Pulse: (!) 54 61  Resp: 16 16  Temp:  97.9 F (36.6 C)  SpO2: 97% 100%    Constitutional: Alert and oriented. Ill appearing, diaphoretic but in no acute distress. Eyes: Conjunctivae are normal.  Head: Atraumatic. Nose: No congestion/rhinnorhea. Mouth/Throat: Mucous membranes are moist.   Neck: No stridor. Painless ROM.  Cardiovascular: Normal rate, regular rhythm. Grossly normal heart sounds.  Good peripheral circulation. Respiratory: Normal respiratory effort.  No retractions. Lungs CTAB. Gastrointestinal: Soft and nontender. No distention. No abdominal bruits. No CVA tenderness. Musculoskeletal:  Obvious deformity and instability to distal left humerus and elbow, 2cm wound to proximal posterior elbow,  2+ radial and ulnar pulses. SILT distally.  Motor intact to R,U,M.  AIN intact. No lower extremity tenderness nor edema.  No joint effusions. Neurologic:  Normal speech and language. No gross focal neurologic deficits are appreciated. No facial droop Skin:  Skin is warm, dry and intact. No rash noted. Psychiatric: Mood and affect are normal. Speech and behavior are normal.  ____________________________________________   LABS (all labs ordered are listed, but only abnormal results are displayed)  Results for orders placed or performed during the hospital encounter of 08/12/17 (from the past 24 hour(s))  CBC with Differential/Platelet     Status: Abnormal   Collection Time: 08/12/17  3:28 PM  Result Value Ref Range   WBC 9.3 3.6 - 11.0 K/uL   RBC 3.81 3.80 - 5.20 MIL/uL   Hemoglobin 12.2 12.0 - 16.0 g/dL   HCT 35.2 35.0 - 47.0  %   MCV 92.4 80.0 - 100.0 fL   MCH 32.0 26.0 - 34.0 pg   MCHC 34.6 32.0 - 36.0 g/dL   RDW 14.6 (H) 11.5 - 14.5 %   Platelets 219 150 - 440 K/uL   Neutrophils Relative % 86 %   Neutro Abs 8.0 (H) 1.4 - 6.5 K/uL   Lymphocytes Relative  10 %   Lymphs Abs 0.9 (L) 1.0 - 3.6 K/uL   Monocytes Relative 4 %   Monocytes Absolute 0.4 0.2 - 0.9 K/uL   Eosinophils Relative 0 %   Eosinophils Absolute 0.0 0 - 0.7 K/uL   Basophils Relative 0 %   Basophils Absolute 0.0 0 - 0.1 K/uL  Type and screen [facility]     Status: None   Collection Time: 08/12/17  3:28 PM  Result Value Ref Range   ABO/RH(D) O POS    Antibody Screen NEG    Sample Expiration 08/15/2017   Comprehensive metabolic panel     Status: Abnormal   Collection Time: 08/12/17  3:28 PM  Result Value Ref Range   Sodium 136 135 - 145 mmol/L   Potassium 4.0 3.5 - 5.1 mmol/L   Chloride 100 (L) 101 - 111 mmol/L   CO2 27 22 - 32 mmol/L   Glucose, Bld 160 (H) 65 - 99 mg/dL   BUN 30 (H) 6 - 20 mg/dL   Creatinine, Ser 1.18 (H) 0.44 - 1.00 mg/dL   Calcium 8.9 8.9 - 10.3 mg/dL   Total Protein 6.8 6.5 - 8.1 g/dL   Albumin 4.0 3.5 - 5.0 g/dL   AST 16 15 - 41 U/L   ALT 16 14 - 54 U/L   Alkaline Phosphatase 86 38 - 126 U/L   Total Bilirubin 1.1 0.3 - 1.2 mg/dL   GFR calc non Af Amer 44 (L) >60 mL/min   GFR calc Af Amer 51 (L) >60 mL/min   Anion gap 9 5 - 15  Troponin I     Status: None   Collection Time: 08/12/17  3:28 PM  Result Value Ref Range   Troponin I <0.03 <0.03 ng/mL  CK     Status: None   Collection Time: 08/12/17  3:28 PM  Result Value Ref Range   Total CK 45 38 - 234 U/L  Urinalysis, Complete w Microscopic     Status: Abnormal   Collection Time: 08/12/17  5:18 PM  Result Value Ref Range   Color, Urine YELLOW (A) YELLOW   APPearance CLEAR (A) CLEAR   Specific Gravity, Urine 1.011 1.005 - 1.030   pH 6.0 5.0 - 8.0   Glucose, UA NEGATIVE NEGATIVE mg/dL   Hgb urine dipstick NEGATIVE NEGATIVE    Bilirubin Urine NEGATIVE NEGATIVE   Ketones, ur NEGATIVE NEGATIVE mg/dL   Protein, ur NEGATIVE NEGATIVE mg/dL   Nitrite NEGATIVE NEGATIVE   Leukocytes, UA NEGATIVE NEGATIVE   RBC / HPF 0-5 0 - 5 RBC/hpf   WBC, UA 0-5 0 - 5 WBC/hpf   Bacteria, UA NONE SEEN NONE SEEN   Squamous Epithelial / LPF NONE SEEN NONE SEEN   Mucus PRESENT    Hyaline Casts, UA PRESENT    ____________________________________________  EKG My review and personal interpretation at Time: 15:30   Indication: fall  Rate: 85  Rhythm: sinus Axis: normal Other: iRBBB, no stemi, borderline prolonged qt ____________________________________________  RADIOLOGY  I personally reviewed all radiographic images ordered to evaluate for the above acute complaints and reviewed radiology reports and findings.  These findings were personally discussed with the patient.  Please see medical record for radiology report.  ____________________________________________   PROCEDURES  Procedure(s) performed:  Procedures    Critical Care performed: yes CRITICAL CARE Performed by: Merlyn Lot   Total critical care time: 45 minutes  Critical care time was exclusive of separately billable procedures and treating other patients.  Critical care was necessary to treat or prevent imminent or life-threatening deterioration.  Critical care was time spent personally by me on the following activities: development of treatment plan with patient and/or surrogate as well as nursing, discussions with consultants, evaluation of patient's response to treatment, examination of patient, obtaining history from patient or surrogate, ordering and performing treatments and interventions, ordering and review of laboratory studies, ordering and review of radiographic studies, pulse oximetry and re-evaluation of patient's condition.  ____________________________________________   INITIAL IMPRESSION / ASSESSMENT AND PLAN / ED COURSE  Pertinent labs  & imaging results that were available during my care of the patient were reviewed by me and considered in my medical decision making (see chart for details).  DDX: fracture, dislocation, open fracture, contusion, aki, heat illness  NANAMI WHITELAW is a 76 y.o. who presents to the ED with obvious traumatic injury with open fracture to the left elbow as described above. Does not have any evidence of other associated traumatic injury. Patient arrives diaphoretic and ill-appearing but has been laying out in the hot sun for 45 minutes. I will check a CK. X-rays ordered for above differential. She denies any chest pain or worsening shortness of breath therefore I doubt cardiac pathology at this time that she is at high risk given her chronic conditions. Patient's tetanus will be updated. She'll be given IV antibiotics for open fracture.  The patient will be placed on continuous pulse oximetry and telemetry for monitoring.  Laboratory evaluation will be sent to evaluate for the above complaints.     Clinical Course as of Aug 12 1854  Fri Aug 12, 2017  1701 I spoke with Dr. Posey Pronto of orthopedics who does not feel comfortable managing this injury and has recommended transfer to a higher level of care.  Will page [facility].  [PR]    Clinical Course User Index [PR] Merlyn Lot, MD   ----------------------------------------- 6:53 PM on 08/12/2017 -----------------------------------------  Patient reassessed and hemodynamic stable prior to transfer to [facility]. CT imaging without any evidence of other associated traumatic injury.  Have discussed with the patient and available family all diagnostics and treatments performed thus far and all questions were answered to the best of my ability. The patient demonstrates understanding and agreement with plan.   ____________________________________________   FINAL CLINICAL IMPRESSION(S) / ED DIAGNOSES  Final diagnoses:  Fall, initial encounter  Other open  displaced fracture of distal end of left humerus, initial encounter      NEW MEDICATIONS STARTED DURING THIS VISIT:  New Prescriptions   No medications on file     Note:  This document was prepared using Dragon voice recognition software and may include unintentional dictation errors.    Merlyn Lot, MD 08/12/17 Randol Kern    Merlyn Lot, MD 08/12/17 [phone]

## 2017-08-12 NOTE — ED Notes (Signed)
Patient placed on bedpan.  Urine sample collected and sent to lab.

## 2017-08-12 NOTE — ED Notes (Signed)
X-ray at bedside

## 2017-08-12 NOTE — ED Notes (Signed)
Patient returned to room.  Husband arrived and at bedside.  Dr. Coralie Common informed.

## 2017-08-12 NOTE — ED Triage Notes (Signed)
Pt to ED via EMS from home c/o fall and left arm injury.  EMS states patient fell down 3 concrete steps onto brick walkway.  Patient with open humerus fracture, bleeding.  Pt given 161mcg fentanyl and 4mg  zofran via EMS at 1445.  Pt was in the sun for 45 minutes prior to getting help.  EMS vitals: 122/55 BP, 70 HR, 155 CBG.

## 2017-08-12 NOTE — ED Notes (Signed)
Dr. Durenda Guthrie, [facility], stopped by to speak with patient.  Patient at [place].  Will attempt to call Dr. Coralie Common when patient returns.  [phone]

## 2017-08-19 ENCOUNTER — Emergency Department: Payer: Medicare (Managed Care)

## 2017-08-19 ENCOUNTER — Emergency Department
Admission: EM | Admit: 2017-08-19 | Discharge: 2017-08-19 | Disposition: A | Discharge status: Home / Self Care | Payer: Medicare (Managed Care) | Source: Home / Self Care | Attending: Emergency Medicine | Admitting: Emergency Medicine

## 2017-08-19 ENCOUNTER — Encounter: Payer: Self-pay | Admitting: Emergency Medicine

## 2017-08-19 ENCOUNTER — Observation Stay
Admission: EM | Admit: 2017-08-19 | Discharge: 2017-08-21 | Disposition: A | Discharge status: Skilled Nursing Facility | Payer: Medicare (Managed Care) | Attending: Internal Medicine | Admitting: Internal Medicine

## 2017-08-19 DIAGNOSIS — Z87891 Personal history of nicotine dependence: Secondary | ICD-10-CM | POA: Insufficient documentation

## 2017-08-19 DIAGNOSIS — E119 Type 2 diabetes mellitus without complications: Secondary | ICD-10-CM | POA: Diagnosis not present

## 2017-08-19 DIAGNOSIS — E1122 Type 2 diabetes mellitus with diabetic chronic kidney disease: Secondary | ICD-10-CM | POA: Insufficient documentation

## 2017-08-19 DIAGNOSIS — N3000 Acute cystitis without hematuria: Secondary | ICD-10-CM | POA: Diagnosis not present

## 2017-08-19 DIAGNOSIS — M1990 Unspecified osteoarthritis, unspecified site: Secondary | ICD-10-CM | POA: Insufficient documentation

## 2017-08-19 DIAGNOSIS — M79602 Pain in left arm: Secondary | ICD-10-CM | POA: Diagnosis present

## 2017-08-19 DIAGNOSIS — I129 Hypertensive chronic kidney disease with stage 1 through stage 4 chronic kidney disease, or unspecified chronic kidney disease: Secondary | ICD-10-CM | POA: Insufficient documentation

## 2017-08-19 DIAGNOSIS — Z79899 Other long term (current) drug therapy: Secondary | ICD-10-CM | POA: Diagnosis not present

## 2017-08-19 DIAGNOSIS — S42412D Displaced simple supracondylar fracture without intercondylar fracture of left humerus, subsequent encounter for fracture with routine healing: Secondary | ICD-10-CM | POA: Insufficient documentation

## 2017-08-19 DIAGNOSIS — A419 Sepsis, unspecified organism: Secondary | ICD-10-CM | POA: Diagnosis not present

## 2017-08-19 DIAGNOSIS — N189 Chronic kidney disease, unspecified: Secondary | ICD-10-CM | POA: Insufficient documentation

## 2017-08-19 DIAGNOSIS — G4730 Sleep apnea, unspecified: Secondary | ICD-10-CM | POA: Diagnosis not present

## 2017-08-19 DIAGNOSIS — J449 Chronic obstructive pulmonary disease, unspecified: Secondary | ICD-10-CM | POA: Insufficient documentation

## 2017-08-19 DIAGNOSIS — F329 Major depressive disorder, single episode, unspecified: Secondary | ICD-10-CM | POA: Insufficient documentation

## 2017-08-19 DIAGNOSIS — D689 Coagulation defect, unspecified: Secondary | ICD-10-CM | POA: Diagnosis present

## 2017-08-19 DIAGNOSIS — S42422D Displaced comminuted supracondylar fracture without intercondylar fracture of left humerus, subsequent encounter for fracture with routine healing: Secondary | ICD-10-CM

## 2017-08-19 DIAGNOSIS — I429 Cardiomyopathy, unspecified: Secondary | ICD-10-CM | POA: Diagnosis not present

## 2017-08-19 DIAGNOSIS — J189 Pneumonia, unspecified organism: Secondary | ICD-10-CM | POA: Diagnosis present

## 2017-08-19 DIAGNOSIS — W19XXXD Unspecified fall, subsequent encounter: Secondary | ICD-10-CM

## 2017-08-19 DIAGNOSIS — I10 Essential (primary) hypertension: Secondary | ICD-10-CM | POA: Diagnosis present

## 2017-08-19 LAB — CBC WITH DIFFERENTIAL/PLATELET
BASOS PCT: 0 %
Basophils Absolute: 0 10*3/uL (ref 0–0.1)
EOS ABS: 0.1 10*3/uL (ref 0–0.7)
EOS PCT: 1 %
HCT: 28.4 % — ABNORMAL LOW (ref 35.0–47.0)
Hemoglobin: 9.8 g/dL — ABNORMAL LOW (ref 12.0–16.0)
LYMPHS ABS: 1.2 10*3/uL (ref 1.0–3.6)
Lymphocytes Relative: 10 %
MCH: 31.9 pg (ref 26.0–34.0)
MCHC: 34.3 g/dL (ref 32.0–36.0)
MCV: 93 fL (ref 80.0–100.0)
MONO ABS: 0.9 10*3/uL (ref 0.2–0.9)
Monocytes Relative: 8 %
NEUTROS PCT: 81 %
Neutro Abs: 9.3 10*3/uL — ABNORMAL HIGH (ref 1.4–6.5)
PLATELETS: 296 10*3/uL (ref 150–440)
RBC: 3.06 MIL/uL — ABNORMAL LOW (ref 3.80–5.20)
RDW: 14.6 % — AB (ref 11.5–14.5)
WBC: 11.5 10*3/uL — ABNORMAL HIGH (ref 3.6–11.0)

## 2017-08-19 LAB — COMPREHENSIVE METABOLIC PANEL
ALK PHOS: 74 U/L (ref 38–126)
ALT: 16 U/L (ref 14–54)
AST: 22 U/L (ref 15–41)
Albumin: 3.4 g/dL — ABNORMAL LOW (ref 3.5–5.0)
Anion gap: 11 (ref 5–15)
BUN: 22 mg/dL — AB (ref 6–20)
CALCIUM: 9 mg/dL (ref 8.9–10.3)
CHLORIDE: 93 mmol/L — AB (ref 101–111)
CO2: 30 mmol/L (ref 22–32)
CREATININE: 0.9 mg/dL (ref 0.44–1.00)
Glucose, Bld: 122 mg/dL — ABNORMAL HIGH (ref 65–99)
Potassium: 4.1 mmol/L (ref 3.5–5.1)
SODIUM: 134 mmol/L — AB (ref 135–145)
Total Bilirubin: 0.7 mg/dL (ref 0.3–1.2)
Total Protein: 6.9 g/dL (ref 6.5–8.1)

## 2017-08-19 LAB — TROPONIN I

## 2017-08-19 LAB — CBC
HCT: 29.9 % — ABNORMAL LOW (ref 35.0–47.0)
HEMOGLOBIN: 10.4 g/dL — AB (ref 12.0–16.0)
MCH: 32 pg (ref 26.0–34.0)
MCHC: 34.6 g/dL (ref 32.0–36.0)
MCV: 92.4 fL (ref 80.0–100.0)
Platelets: 364 10*3/uL (ref 150–440)
RBC: 3.24 MIL/uL — ABNORMAL LOW (ref 3.80–5.20)
RDW: 15.2 % — AB (ref 11.5–14.5)
WBC: 20.2 10*3/uL — ABNORMAL HIGH (ref 3.6–11.0)

## 2017-08-19 LAB — BASIC METABOLIC PANEL
Anion gap: 12 (ref 5–15)
BUN: 23 mg/dL — AB (ref 6–20)
CALCIUM: 9.3 mg/dL (ref 8.9–10.3)
CO2: 29 mmol/L (ref 22–32)
CREATININE: 1.01 mg/dL — AB (ref 0.44–1.00)
Chloride: 92 mmol/L — ABNORMAL LOW (ref 101–111)
GFR calc Af Amer: >60 mL/min (ref >60)
GFR, EST NON AFRICAN AMERICAN: 53 mL/min — AB (ref >60)
GLUCOSE: 153 mg/dL — AB (ref 65–99)
Potassium: 4.3 mmol/L (ref 3.5–5.1)
Sodium: 133 mmol/L — ABNORMAL LOW (ref 135–145)

## 2017-08-19 LAB — URINALYSIS, COMPLETE (UACMP) WITH MICROSCOPIC
BILIRUBIN URINE: NEGATIVE
Bacteria, UA: NONE SEEN
GLUCOSE, UA: NEGATIVE mg/dL
KETONES UR: NEGATIVE mg/dL
Nitrite: NEGATIVE
PH: 5 (ref 5.0–8.0)
Protein, ur: NEGATIVE mg/dL
SPECIFIC GRAVITY, URINE: 1.015 (ref 1.005–1.030)

## 2017-08-19 LAB — CK: CK TOTAL: 51 U/L (ref 38–234)

## 2017-08-19 MED ORDER — OXYCODONE-ACETAMINOPHEN 5-325 MG PO TABS
1.0000 | ORAL_TABLET | Freq: Once | ORAL | Status: AC
  Administered 2017-08-19: 1 via ORAL
  Filled 2017-08-19: qty 1

## 2017-08-19 MED ORDER — DEXTROSE 5 % IV SOLN
500.0000 mg | Freq: Once | INTRAVENOUS | Status: AC
  Administered 2017-08-19: 500 mg via INTRAVENOUS
  Filled 2017-08-19: qty 500

## 2017-08-19 MED ORDER — DIAZEPAM 5 MG PO TABS
5.0000 mg | ORAL_TABLET | Freq: Once | ORAL | Status: AC
  Administered 2017-08-19: 5 mg via ORAL
  Filled 2017-08-19: qty 1

## 2017-08-19 MED ORDER — FENTANYL CITRATE (PF) 100 MCG/2ML IJ SOLN
100.0000 ug | Freq: Once | INTRAMUSCULAR | Status: AC
  Administered 2017-08-19: 100 ug via INTRAVENOUS
  Filled 2017-08-19: qty 2

## 2017-08-19 MED ORDER — VANCOMYCIN HCL IN DEXTROSE 1-5 GM/200ML-% IV SOLN
1000.0000 mg | Freq: Once | INTRAVENOUS | Status: AC
  Administered 2017-08-19: 1000 mg via INTRAVENOUS
  Filled 2017-08-19: qty 200

## 2017-08-19 MED ORDER — DEXTROSE 5 % IV SOLN
1.0000 g | Freq: Once | INTRAVENOUS | Status: AC
  Administered 2017-08-19: 1 g via INTRAVENOUS
  Filled 2017-08-19: qty 1

## 2017-08-19 MED ORDER — KETOROLAC TROMETHAMINE 30 MG/ML IJ SOLN
15.0000 mg | Freq: Once | INTRAMUSCULAR | Status: AC
  Administered 2017-08-19: 15 mg via INTRAVENOUS
  Filled 2017-08-19: qty 1

## 2017-08-19 MED ORDER — ACETAMINOPHEN 500 MG PO TABS
1000.0000 mg | ORAL_TABLET | Freq: Once | ORAL | Status: AC
  Administered 2017-08-19: 1000 mg via ORAL
  Filled 2017-08-19: qty 2

## 2017-08-19 MED ORDER — OXYCODONE-ACETAMINOPHEN 5-325 MG PO TABS: 1.0000 | ORAL_TABLET | Freq: Four times a day (QID) | ORAL | 0 refills | Status: DC | PRN

## 2017-08-19 MED ORDER — IPRATROPIUM-ALBUTEROL 0.5-2.5 (3) MG/3ML IN SOLN
3.0000 mL | Freq: Once | RESPIRATORY_TRACT | Status: AC
  Administered 2017-08-19: 3 mL via RESPIRATORY_TRACT
  Filled 2017-08-19: qty 3

## 2017-08-19 MED ORDER — FENTANYL CITRATE (PF) 100 MCG/2ML IJ SOLN
50.0000 ug | Freq: Once | INTRAMUSCULAR | Status: AC
  Administered 2017-08-19: 50 ug via INTRAVENOUS
  Filled 2017-08-19: qty 2

## 2017-08-19 NOTE — ED Provider Notes (Signed)
[facility] Emergency Department Provider Note  ____________________________________________   First MD Initiated Contact with Patient 08/19/17 [phone]     (approximate)  I have reviewed the triage vital signs and the nursing notes.   HISTORY  Chief Complaint Arm Pain    HPI Sara Pierce is a 76 y.o. female who comes to the emergency department via EMS with roughly 48 hours of severe left arm pain. One week ago the patient had a mechanical fall that she sustained a complex supracondylar fracture to her left humerus. She was transferred to the [facility] where she had intraoperative repair. She did well in the hospital although 2 days ago was transferred to a nursing home where she has received tramadol and Tylenol for pain. She says this has not been adequate pain control. She has attempted to come to the hospital for the past 2 days although today someone finally called 911. Her pain is severe aching worse with movement and somewhat improved with rest. She denies numbness or weakness.   Past Medical History:  Diagnosis Date  . Arthritis   . Cardiomyopathy ([place])   . Cataract   . Cerebral hemorrhage ([place])   . Chronic kidney disease   . Clotting disorder ([place])   . COPD (chronic obstructive pulmonary disease) ([place])   . Depression   . Diabetes mellitus without complication ([place])   . Hypertension   . Mixed incontinence   . Obesity   . Sleep apnea   . Thyroid disease   . Urinary frequency   . Vaginal atrophy   . Varicose veins     Patient Active Problem List   Diagnosis Date Noted  . CAP (community acquired pneumonia) 10/03/2016  . Sepsis ([place]) 11/15/2015  . Community acquired pneumonia 11/15/2015  . Absolute anemia 03/15/2015  . Anxiety 03/15/2015  . Arthritis 03/15/2015  . Cardiomyopathy, secondary ([place]) 03/15/2015  . Cataract 03/15/2015  . Brain bleed ([place]) 03/15/2015  . Chronic constipation 03/15/2015  . Chronic kidney  failure 03/15/2015  . Blood clotting disorder ([place]) 03/15/2015  . CAFL (chronic airflow limitation) ([place]) 03/15/2015  . Clinical depression 03/15/2015  . Diabetes ([place]) 03/15/2015  . H/O varicella 03/15/2015  . BP (high blood pressure) 03/15/2015  . Mixed incontinence 03/15/2015  . 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 Adiposity 03/15/2015  . Apnea, sleep 03/15/2015  . Disease of thyroid gland 03/15/2015  . FOM (frequency of micturition) 03/15/2015  . 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 Candida vaginitis 03/15/2015  . Abnormal ECG 03/15/2015    Past Surgical History:  Procedure Laterality Date  . APPENDECTOMY    . Cardiac Catherization    . COLONOSCOPY WITH PROPOFOL N/A 04/28/2016   Procedure: COLONOSCOPY WITH PROPOFOL;  Surgeon: Manya Silvas, MD;  Location: [facility] ENDOSCOPY;  Service: Endoscopy;  Laterality: N/A;  . COLONOSCOPY WITH PROPOFOL N/A 04/29/2016   Procedure: COLONOSCOPY WITH PROPOFOL;  Surgeon: Manya Silvas, MD;  Location: [facility] ENDOSCOPY;  Service: Endoscopy;  Laterality: N/A;  . JOINT REPLACEMENT    . STRABISMUS SURGERY    . TONSILLECTOMY    . TUBAL LIGATION      Prior to Admission medications   Medication Sig Start Date End Date Taking? Authorizing Provider  albuterol (PROVENTIL HFA;VENTOLIN HFA) 108 (90 BASE) MCG/ACT inhaler Inhale 2 puffs into the lungs every 6 (six) hours as needed for wheezing.     [provider]  albuterol (PROVENTIL HFA;VENTOLIN HFA) 108 (90  Base) MCG/ACT inhaler Inhale 2 puffs into the lungs every 6 (six) hours as needed for wheezing or shortness of breath. 06/15/17   Rudene Re, MD  benzonatate (TESSALON) 200 MG capsule Take 200 mg by mouth 3 (three) times daily as needed for cough.    [provider]  buPROPion (WELLBUTRIN XL) 300 MG 24 hr tablet Take 300 mg by mouth daily.     [provider]  carvedilol (COREG) 6.25 MG tablet Take 6.25 mg by mouth 2 (two) times  daily.     [provider]  clonazePAM (KLONOPIN) 0.5 MG tablet Take 1 tablet (0.5 mg total) by mouth 3 (three) times daily as needed (Anxiety). 11/19/15   Gladstone Lighter, MD  clotrimazole (LOTRIMIN) 1 % cream Apply 1 application topically 2 (two) times daily.    [provider]  clotrimazole-betamethasone (LOTRISONE) cream Apply 1 application topically 2 (two) times daily.    [provider]  docusate sodium (COLACE) 100 MG capsule Take 1 capsule (100 mg total) by mouth 2 (two) times daily as needed for mild constipation. 10/04/16   Gouru, Illene Silver, MD  estradiol (ESTRACE) 0.1 MG/GM vaginal cream Place 1 Applicatorful vaginally 3 (three) times a week. Apply a pea-sized amount with tip of finger on Monday, Wednesday, and Friday night    [provider]  FLUoxetine (PROZAC) 40 MG capsule Take 40 mg by mouth daily.    [provider]  Fluticasone-Salmeterol (ADVAIR) 500-50 MCG/DOSE AEPB Inhale 1 puff into the lungs 2 (two) times daily.    [provider]  guaiFENesin-dextromethorphan (ROBITUSSIN DM) 100-10 MG/5ML syrup Take 10 mLs by mouth every 6 (six) hours as needed for cough. 10/04/16   Nicholes Mango, MD  hydrocortisone cream 1 % Apply 1 application topically 2 (two) times daily as needed for itching.    [provider]  ipratropium-albuterol (DUONEB) 0.5-2.5 (3) MG/3ML SOLN Inhale 3 mLs into the lungs 4 (four) times daily as needed (wheezing).     [provider]  levofloxacin (LEVAQUIN) 500 MG tablet Take 1 tablet (500 mg total) by mouth daily. 10/05/16   Nicholes Mango, MD  liothyronine (CYTOMEL) 25 MCG tablet Take 25 mcg by mouth daily.     [provider]  magnesium oxide (MAG-OX) 400 MG tablet Take 400 mg by mouth daily.     [provider]  montelukast (SINGULAIR) 10 MG tablet Take 10 mg by mouth at bedtime.     [provider]  nystatin (MYCOSTATIN/NYSTOP) 100000 UNIT/GM POWD Apply topically 2  (two) times daily as needed (rash). 1 application    [provider]  oxyCODONE-acetaminophen (ROXICET) 5-325 MG tablet Take 1 tablet by mouth every 6 (six) hours as needed for severe pain. 08/19/17   Darel Hong, MD  potassium chloride (K-DUR) 10 MEQ tablet Take 10 mEq by mouth daily.     [provider]  primidone (MYSOLINE) 50 MG tablet Take 50 mg by mouth 2 (two) times daily.     [provider]  simvastatin (ZOCOR) 40 MG tablet Take 40 mg by mouth at bedtime.     [provider]  sodium chloride (OCEAN) 0.65 % SOLN nasal spray Place 2 sprays into both nostrils 3 (three) times daily as needed for congestion. 10/04/16   Gouru, Illene Silver, MD  solifenacin (VESICARE) 10 MG tablet Take 10 mg by mouth daily.    [provider]  spironolactone (ALDACTONE) 25 MG tablet Take 1 tablet (25 mg total) by mouth daily. 11/19/15  Gladstone Lighter, MD  tiotropium (SPIRIVA) 18 MCG inhalation capsule Place 18 mcg into inhaler and inhale daily.     [provider]  torsemide (DEMADEX) 20 MG tablet Take 1 tablet (20 mg total) by mouth 2 (two) times daily. 10/04/16   Nicholes Mango, MD  traZODone (DESYREL) 50 MG tablet Take 50 mg by mouth at bedtime as needed for sleep.    [provider]  triamcinolone (KENALOG) 0.025 % cream Apply 1 application topically as needed (itchiness).    [provider]  Vitamin D, Ergocalciferol, (DRISDOL) 50000 UNITS CAPS capsule Take 50,000 Units by mouth every 30 (thirty) days. On the first of each month    [provider]    Allergies Aspirin; Codeine; and Tape  Family History  Problem Relation Age of Onset  . Heart attack Father   . Coronary artery disease Father   . Hypertension Father   . Hyperlipidemia Father   . Heart attack Mother   . Hypertension Mother   . Hyperlipidemia Mother   . Breast cancer Neg Hx     Social History Social History  Substance Use Topics  . Smoking status: Former  Smoker    Packs/day: 0.50    Years: 44.00    Types: Cigarettes  . Smokeless tobacco: Never Used  . Alcohol use No    Review of Systems Constitutional: No fever/chills ENT: No sore throat. Cardiovascular: Denies chest pain. Respiratory: Denies shortness of breath. Gastrointestinal: No abdominal pain.  No nausea, no vomiting.  No diarrhea.  No constipation. Musculoskeletal: Negative for back pain. Neurological: Negative for headaches   ____________________________________________   PHYSICAL EXAM:  VITAL SIGNS: ED Triage Vitals  Enc Vitals Group     BP      Pulse      Resp      Temp      Temp src      SpO2      Weight      Height      Head Circumference      Peak Flow      Pain Score      Pain Loc      Pain Edu?      Excl. in [place]?     Constitutional: alert and oriented 4 appears somewhat uncomfortable nontoxic no diaphoresis speaks in full clear sentences Head: Atraumatic. Nose: No congestion/rhinnorhea. Mouth/Throat: No trismus Neck: No stridor.   Cardiovascular: regular rate and rhythm no murmurs Respiratory: Normal respiratory effort.  No retractions. musculoskeletal:  No tenderness over distal radius or distal ulna. No tenderness over snuffbox and no axial load discomfort Sensation intact to light touch over first dorsal webspace, distal index finger, distal small finger Can flex and oppose  thumb, cross 2 on 3, and extend wrist 2+ radial pulse and less than 2 second capillary refill Skin is closed Compartments are soft left arm ecchymotic and swollen but all compartments are soft surgical wound appears healed healthy Neurologic:  Normal speech and language. No gross focal neurologic deficits are appreciated.  Skin:  Skin is warm, dry and intact. No rash noted.    ____________________________________________  LABS (all labs ordered are listed, but only abnormal results are displayed)  Labs Reviewed  COMPREHENSIVE METABOLIC PANEL - Abnormal; Notable  for the following:       Result Value   Sodium 134 (*)    Chloride 93 (*)    Glucose, Bld 122 (*)    BUN 22 (*)    Albumin 3.4 (*)  All other components within normal limits  CK  CBC WITH DIFFERENTIAL/PLATELET    normal CK __________________________________________  EKG   ____________________________________________  RADIOLOGY  x-ray shows normal postoperative healing and bones in good anatomical position ____________________________________________   PROCEDURES  Procedure(s) performed: yes  SPLINT APPLICATION Date/Time: 6:62 AM Authorized by: Darel Hong Consent: Verbal consent obtained. Risks and benefits: risks, benefits and alternatives were discussed Consent given by: patient Splint applied by: ER tech Location details: left arm Splint type: long arm posterior Supplies used: orthoglass Post-procedure: The splinted body part was neurovascularly unchanged following the procedure. Patient tolerance: Patient tolerated the procedure well with no immediate complications.       Procedures  Critical Care performed: no  Observation: no ____________________________________________   INITIAL IMPRESSION / ASSESSMENT AND PLAN / ED COURSE  Pertinent labs & imaging results that were available during my care of the patient were reviewed by me and considered in my medical decision making (see chart for details).      ----------------------------------------- 4:46 AM on 08/19/2017 -----------------------------------------  The patient's pain is adequately controlled after injuring his fentanyl and oral Percocet. I took down her dressing and noted that her surgical wounds are healthy, her compartments are soft, she has no discomfort with passive extension of her digits, and she is neurovascularly intact.she is medically stable for outpatient management.  ____________________________________________   FINAL CLINICAL IMPRESSION(S) / ED DIAGNOSES  Final  diagnoses:  Closed displaced comminuted supracondylar fracture of left humerus without intercondylar fracture with routine healing, subsequent encounter      NEW MEDICATIONS STARTED DURING THIS VISIT:  New Prescriptions   OXYCODONE-ACETAMINOPHEN (ROXICET) 5-325 MG TABLET    Take 1 tablet by mouth every 6 (six) hours as needed for severe pain.     Note:  This document was prepared using Dragon voice recognition software and may include unintentional dictation errors.      Darel Hong, MD 08/19/17 [phone]

## 2017-08-19 NOTE — ED Notes (Signed)
Patient was given 10 mg of oxy IR at facility at 1900. Patient given 50 mg tramadol by facility at 1915. Patient given 5 mg Baclofen by facility at 2030. Patient given addition 10 mg oxy by EMS in transport. MD Alfred Levins ordered.

## 2017-08-19 NOTE — ED Triage Notes (Signed)
Pt arrives to ED via [facility] from [facility] with c/o LEFT arm pain. Pt was d/c'd from [facility] and transferred to rehab following a fall and arm fracture that required surgery. EMS reports pt was placed in the facility of Tuesday evening, had follow-up visit where they pt states they "discontinued her pain medication". No reports of new fall, injury, or trauma to affected extremity. Pt arrives A&O, in moderate distress r/t pain; RR even, regular and unlabored. Skin color/temp is WNL; pt does require constant O2 at 2L/min via [place].

## 2017-08-19 NOTE — ED Triage Notes (Addendum)
Pt to [facility] via ems from [facility], report fall on 8/31 with compound fracture to left elbow, surgery done since.  Pt c/o pain not controlled pt given 10mg  oxy IR, baclofen, and tramadol around 7:30pm, and 10mg  oxy IR shortly PTA.  Pt w/ notable swelling to left hand.  Sling and splint in place on left arm.  Pt also c/o productive cough with clear sputum, states causes pain to back and ribs.

## 2017-08-19 NOTE — ED Notes (Signed)
Patient's IV removed from hand accidentally by patient. Hand swollen, bruised. MD informed. MD ordered compression bandage. Bandage applied

## 2017-08-19 NOTE — ED Notes (Signed)
Pt pending discharge. Called Dr. Wilford Corner at [phone] to inform her pt is ready for transport. Transportation called per MD.

## 2017-08-19 NOTE — ED Notes (Signed)
Patient reports increased comfort of breathing after duoneb

## 2017-08-19 NOTE — ED Notes (Signed)
ED Provider at bedside. 

## 2017-08-19 NOTE — ED Provider Notes (Signed)
[facility] Emergency Department Provider Note  ____________________________________________  Time seen: Approximately 11:32 PM  I have reviewed the triage vital signs and the nursing notes.   HISTORY  Chief Complaint Arm Pain and Cough   HPI Sara Pierce is a 76 y.o. female with a history of diabetes, hypertension, cerebral hemorrhage, cardiomyopathy, COPD who presents for evaluation of cough, shortness of breath, and pain.patient is postop day 6 from a supracondylar fracture repaired. this is her second visit to the emergency room today for pain. She was seen earlier today and received Sentinel with improvement of her pain. She tells me that the rehabilitation has not been giving her pain medications as prescribed and didn't give her anything into this evening. She is complaining of 10 out of 10 pain in her arm. She is also complaining of 10 out of 10 sharp pain in her chest every time she coughs. She has had worsening of her chronic cough and chills. She is also complaining of dysuria. She has had no fever, no vomiting, no diarrhea  Past Medical History:  Diagnosis Date  . Arthritis   . Cardiomyopathy ([place])   . Cataract   . Cerebral hemorrhage ([place])   . Chronic kidney disease   . Clotting disorder ([name])   . COPD (chronic obstructive pulmonary disease) ([place])   . Depression   . Diabetes mellitus without complication ([place])   . Hypertension   . Mixed incontinence   . Obesity   . Sleep apnea   . Thyroid disease   . Urinary frequency   . Vaginal atrophy   . Varicose veins     Patient Active Problem List   Diagnosis Date Noted  . HCAP (healthcare-associated pneumonia) 08/19/2017  . CAP (community acquired pneumonia) 10/03/2016  . Sepsis ([place]) 11/15/2015  . Absolute anemia 03/15/2015  . Anxiety 03/15/2015  . Arthritis 03/15/2015  . Cardiomyopathy, secondary ([place]) 03/15/2015  . Cataract 03/15/2015  . Brain bleed ([place]) 03/15/2015  . Chronic  constipation 03/15/2015  . Chronic kidney failure 03/15/2015  . Blood clotting disorder ([place]) 03/15/2015  . CAFL (chronic airflow limitation) ([place]) 03/15/2015  . Clinical depression 03/15/2015  . Diabetes ([place]) 03/15/2015  . H/O varicella 03/15/2015  . BP (high blood pressure) 03/15/2015  . Mixed incontinence 03/15/2015  . 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 Adiposity 03/15/2015  . Apnea, sleep 03/15/2015  . Disease of thyroid gland 03/15/2015  . FOM (frequency of micturition) 03/15/2015  . 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 Candida vaginitis 03/15/2015  . Abnormal ECG 03/15/2015    Past Surgical History:  Procedure Laterality Date  . APPENDECTOMY    . Cardiac Catherization    . COLONOSCOPY WITH PROPOFOL N/A 04/28/2016   Procedure: COLONOSCOPY WITH PROPOFOL;  Surgeon: Manya Silvas, MD;  Location: [facility] ENDOSCOPY;  Service: Endoscopy;  Laterality: N/A;  . COLONOSCOPY WITH PROPOFOL N/A 04/29/2016   Procedure: COLONOSCOPY WITH PROPOFOL;  Surgeon: Manya Silvas, MD;  Location: [facility] ENDOSCOPY;  Service: Endoscopy;  Laterality: N/A;  . JOINT REPLACEMENT    . STRABISMUS SURGERY    . TONSILLECTOMY    . TUBAL LIGATION      Prior to Admission medications   Medication Sig Start Date End Date Taking? Authorizing Provider  acetaminophen (TYLENOL) 650 MG CR tablet Take 650 mg by mouth every 8 (eight) hours as needed for pain.   Yes [provider]  albuterol (PROVENTIL HFA;VENTOLIN HFA) 108 (90 Base) MCG/ACT inhaler  Inhale 2 puffs into the lungs every 6 (six) hours as needed for wheezing or shortness of breath. 06/15/17  Yes Alfred Levins, [place], MD  albuterol (PROVENTIL) (2.5 MG/3ML) 0.083% nebulizer solution Take 2.5 mg by nebulization every 4 (four) hours as needed for wheezing or shortness of breath.   Yes [provider]  alum & mag hydroxide-simeth (MAALOX/MYLANTA) 200-200-20 MG/5ML suspension Take 15 mLs by mouth every 6 (six)  hours as needed for indigestion or heartburn.   Yes [provider]  buPROPion (WELLBUTRIN XL) 150 MG 24 hr tablet Take 150 mg by mouth daily.   Yes [provider]  FLUoxetine (PROZAC) 40 MG capsule Take 40 mg by mouth daily.   Yes [provider]  fluticasone (FLONASE) 50 MCG/ACT nasal spray Place 1 spray into both nostrils daily.   Yes [provider]  furosemide (LASIX) 40 MG tablet Take 40 mg by mouth 2 (two) times daily.   Yes [provider]  guaiFENesin-dextromethorphan (ROBITUSSIN DM) 100-10 MG/5ML syrup Take 10 mLs by mouth every 6 (six) hours as needed for cough. 10/04/16  Yes Gouru, Illene Silver, MD  hydrOXYzine (ATARAX/VISTARIL) 25 MG tablet Take 25 mg by mouth 2 (two) times daily as needed for anxiety.   Yes [provider]  ipratropium-albuterol (DUONEB) 0.5-2.5 (3) MG/3ML SOLN Inhale 3 mLs into the lungs 4 (four) times daily as needed (wheezing).    Yes [provider]  liothyronine (CYTOMEL) 25 MCG tablet Take 25 mcg by mouth daily.    Yes [provider]  magnesium oxide (MAG-OX) 400 MG tablet Take 400 mg by mouth daily.    Yes [provider]  montelukast (SINGULAIR) 10 MG tablet Take 10 mg by mouth at bedtime.    Yes [provider]  oxyCODONE (OXY IR/ROXICODONE) 5 MG immediate release tablet Take 5 mg by mouth every 6 (six) hours as needed for moderate pain, severe pain or breakthrough pain.   Yes [provider]  potassium chloride (K-DUR) 10 MEQ tablet Take 10 mEq by mouth daily.    Yes [provider]  predniSONE (DELTASONE) 10 MG tablet Take 10 mg by mouth daily with breakfast.   Yes [provider]  primidone (MYSOLINE) 50 MG tablet Take 50 mg by mouth 2 (two) times daily.    Yes [provider]  senna-docusate (SENOKOT-S) 8.6-50 MG tablet Take 2 tablets by mouth daily.   Yes [provider]  simvastatin (ZOCOR) 40 MG tablet Take 40 mg by mouth at  bedtime.    Yes [provider]  spironolactone (ALDACTONE) 25 MG tablet Take 1 tablet (25 mg total) by mouth daily. 11/19/15  Yes Gladstone Lighter, MD  traMADol (ULTRAM) 50 MG tablet Take 50 mg by mouth every 6 (six) hours as needed for moderate pain.   Yes [provider]  umeclidinium-vilanterol (ANORO ELLIPTA) 62.5-25 MCG/INH AEPB Inhale 1 puff into the lungs daily.   Yes [provider]  docusate sodium (COLACE) 100 MG capsule Take 1 capsule (100 mg total) by mouth 2 (two) times daily as needed for mild constipation. Patient not taking: Reported on 08/19/2017 10/04/16   Nicholes Mango, MD  levofloxacin (LEVAQUIN) 500 MG tablet Take 1 tablet (500 mg total) by mouth daily. Patient not taking: Reported on 08/19/2017 10/05/16   Nicholes Mango, MD  oxyCODONE-acetaminophen (ROXICET) 5-325 MG tablet Take 1 tablet by mouth every 6 (six) hours as needed for severe pain. Patient not taking: Reported on 08/19/2017 08/19/17   Darel Hong, MD  sodium chloride (OCEAN) 0.65 % SOLN nasal spray Place 2 sprays into both nostrils 3 (three) times daily as needed for congestion. Patient not taking: Reported on 08/19/2017 10/04/16   Nicholes Mango, MD  torsemide (DEMADEX) 20 MG tablet Take 1 tablet (20 mg total) by mouth 2 (two) times daily. Patient not taking: Reported on 08/19/2017 10/04/16   Nicholes Mango, MD    Allergies Aspirin; Codeine; and Tape  Family History  Problem Relation Age of Onset  . Heart attack Father   . Coronary artery disease Father   . Hypertension Father   . Hyperlipidemia Father   . Heart attack Mother   . Hypertension Mother   . Hyperlipidemia Mother   . Breast cancer Neg Hx     Social History Social History  Substance Use Topics  . Smoking status: Former Smoker    Packs/day: 0.50    Years: 44.00    Types: Cigarettes  . Smokeless tobacco: Never Used  . Alcohol use No    Review of Systems  Constitutional: Negative for fever. + chills Eyes: Negative  for visual changes. ENT: Negative for sore throat. Neck: No neck pain  Cardiovascular: Negative for chest pain. Respiratory: + shortness of breath, cough Gastrointestinal: Negative for abdominal pain, vomiting or diarrhea. Genitourinary: Negative for dysuria. Musculoskeletal: Negative for back pain. + L arm pain Skin: Negative for rash. Neurological: Negative for headaches, weakness or numbness. Psych: No SI or HI  ____________________________________________   PHYSICAL EXAM:  VITAL SIGNS: ED Triage Vitals  Enc Vitals Group     BP 08/19/17 2144 (!) 149/86     Pulse Rate 08/19/17 2144 75     Resp 08/19/17 2144 (!) 24     Temp 08/19/17 2144 98.2 F (36.8 C)     Temp src --      SpO2 08/19/17 2144 98 %     Weight 08/19/17 2140 247 lb (112 kg)     Height 08/19/17 2140 5\' 5"  (1.651 m)     Head Circumference --      Peak Flow --      Pain Score 08/19/17 2139 10     Pain Loc --      Pain Edu? --      Excl. in [place]? --     Constitutional: Alert and oriented.  HEENT:      Head: Normocephalic and atraumatic.         Eyes: Conjunctivae are normal. Sclera is non-icteric.       Mouth/Throat: Mucous membranes are moist.       Neck: Supple with no signs of meningismus. Cardiovascular: Regular rate and rhythm. No murmurs, gallops, or rubs. 2+ symmetrical distal pulses are present in all extremities. No JVD. Respiratory: Increased work of breathing, productive cough, coarse rhonchi bilaterally Gastrointestinal: Soft, non tender, and non distended with positive bowel sounds. No rebound or guarding. Musculoskeletal: her left arm was unwrapped and shows a well-healing surgical scar, compartments are all soft, patient has significant bruising and swelling which is expected after the surgery Neurologic: Normal speech and language. Face is symmetric. Moving all extremities. No gross focal neurologic deficits are appreciated. Skin: Skin is warm, dry and intact. No rash noted. Psychiatric: Mood  and affect are normal. Speech and behavior are normal.  ____________________________________________   LABS (all labs ordered are listed, but only abnormal results are displayed)  Labs Reviewed  BASIC METABOLIC PANEL - Abnormal; Notable for the following:       Result Value  Sodium 133 (*)    Chloride 92 (*)    Glucose, Bld 153 (*)    BUN 23 (*)    Creatinine, Ser 1.01 (*)    GFR calc non Af Amer 53 (*)    All other components within normal limits  CBC - Abnormal; Notable for the following:    WBC 20.2 (*)    RBC 3.24 (*)    Hemoglobin 10.4 (*)    HCT 29.9 (*)    RDW 15.2 (*)    All other components within normal limits  URINALYSIS, COMPLETE (UACMP) WITH MICROSCOPIC - Abnormal; Notable for the following:    Color, Urine YELLOW (*)    APPearance HAZY (*)    Hgb urine dipstick MODERATE (*)    Leukocytes, UA SMALL (*)    Squamous Epithelial / LPF 0-5 (*)    All other components within normal limits  TROPONIN I  LACTIC ACID, PLASMA   ____________________________________________  EKG  ED ECG REPORT I, Rudene Re, the attending physician, personally viewed and interpreted this ECG.  Sinus rhythm, rate of 75, right bundle branch block, T-wave inversions in anterior leads, no ST elevations or depressions. ____________________________________________  RADIOLOGY  CXR:  Stable cardiomegaly without acute abnormality. ____________________________________________   PROCEDURES  Procedure(s) performed: None Procedures Critical Care performed:  None ____________________________________________   INITIAL IMPRESSION / ASSESSMENT AND PLAN / ED COURSE  76 y.o. female with a history of diabetes, hypertension, cerebral hemorrhage, cardiomyopathy, COPD who presents for evaluation of cough, shortness of breath, and pain.there is no evidence of compartment syndrome, surgical scar is well healing. Patient has swelling and bruising of the arm which is normal after surgery.  New splint placed since old was falling apart. She received 1 dose of fentanyl with complete resolution of her pain. Patient has a productive cough and coarse rhonchi, her white count this morning was normal and is now 20, she is complaining of chills and she also has a UTI. The fact the patient was intubated and had a Foley catheter 6 days ago during her surgery I believe that she meets criteria for admission for IV antibiotics for possible HCAP and hospital-acquired UTI. Patient given cefepime, vancomycin, and azithromycin. Given duoneb. Patient admitted to Hospitalist     Pertinent labs & imaging results that were available during my care of the patient were reviewed by me and considered in my medical decision making (see chart for details).    ____________________________________________   FINAL CLINICAL IMPRESSION(S) / ED DIAGNOSES  Final diagnoses:  HCAP (healthcare-associated pneumonia)  Acute cystitis without hematuria  Pain of left upper extremity      NEW MEDICATIONS STARTED DURING THIS VISIT:  New Prescriptions   No medications on file     Note:  This document was prepared using Dragon voice recognition software and may include unintentional dictation errors.    Alfred Levins, [place], MD 08/20/17 [phone]

## 2017-08-19 NOTE — ED Notes (Signed)
Pt resting comfortably. Still waiting for ride

## 2017-08-19 NOTE — Discharge Instructions (Signed)
Please keep your follow-up with your orthopedic surgeon as scheduled. Return to the emergency department for any new or worsening symptoms such as worsening pain, numbness, weakness, or for any other issues whatsoever.  It was a pleasure to take care of you today, and thank you for coming to our emergency department.  If you have any questions or concerns before leaving please ask the nurse to grab me and I'm more than happy to go through your aftercare instructions again.  If you were prescribed any opioid pain medication today such as Norco, Vicodin, Percocet, morphine, hydrocodone, or oxycodone please make sure you do not drive when you are taking this medication as it can alter your ability to drive safely.  If you have any concerns once you are home that you are not improving or are in fact getting worse before you can make it to your follow-up appointment, please do not hesitate to call [phone] and come back for further evaluation.  Darel Hong, MD  Results for orders placed or performed during the hospital encounter of 08/19/17  Comprehensive metabolic panel  Result Value Ref Range   Sodium 134 (L) 135 - 145 mmol/L   Potassium 4.1 3.5 - 5.1 mmol/L   Chloride 93 (L) 101 - 111 mmol/L   CO2 30 22 - 32 mmol/L   Glucose, Bld 122 (H) 65 - 99 mg/dL   BUN 22 (H) 6 - 20 mg/dL   Creatinine, Ser 0.90 0.44 - 1.00 mg/dL   Calcium 9.0 8.9 - 10.3 mg/dL   Total Protein 6.9 6.5 - 8.1 g/dL   Albumin 3.4 (L) 3.5 - 5.0 g/dL   AST 22 15 - 41 U/L   ALT 16 14 - 54 U/L   Alkaline Phosphatase 74 38 - 126 U/L   Total Bilirubin 0.7 0.3 - 1.2 mg/dL   GFR calc non Af Amer >60 >60 mL/min   GFR calc Af Amer >60 >60 mL/min   Anion gap 11 5 - 15  CK  Result Value Ref Range   Total CK 51 38 - 234 U/L   Dg Chest 1 View  Result Date: 08/12/2017 CLINICAL DATA:  Fall with elbow fracture. EXAM: CHEST 1 VIEW COMPARISON:  06/15/2017 and prior radiographs FINDINGS: Cardiomegaly noted. There is no evidence of focal  airspace disease, pulmonary edema, suspicious pulmonary nodule/mass, pleural effusion, or pneumothorax. No acute bony abnormalities are identified. Right shoulder arthroplasty changes again noted. IMPRESSION: Cardiomegaly without evidence of acute cardiopulmonary disease. Electronically Signed   By: Margarette Canada M.D.   On: 08/12/2017 16:14   Dg Elbow Complete Left  Result Date: 08/19/2017 CLINICAL DATA:  Patient fell with fracture dislocation of the left elbow on 08/12/2017. Now with worsening pain after repair. EXAM: LEFT ELBOW - COMPLETE 3+ VIEW COMPARISON:  08/12/2017 FINDINGS: Cast material is present which obscures bone detail. There has been interval plate and screw fixation of fractures of the distal humerus and proximal ulna. Fracture lines remain visible. Fragmentation of the supracondylar region of the humerus. Fracture fragments appear to be in near anatomic position. There is elevation of the distal aspect of the medial humeral plate. Screws appear flush with the plates. No prior postoperative views are available for comparison. IMPRESSION: Postoperative plate and screw fixation of the distal humerus and proximal ulna with cast placement. Fracture lines remain visible although the bones appear to be in near anatomic position. Correlation with any prior postoperative views would be useful to look for any interval change. Electronically Signed  By: Lucienne Capers M.D.   On: 08/19/2017 04:16   Dg Forearm Left  Result Date: 08/12/2017 CLINICAL DATA:  Acute forearm pain following fall. Initial encounter. EXAM: LEFT FOREARM - 2 VIEW COMPARISON:  None. FINDINGS: A comminuted displaced intra-articular distal humeral fracture is identified with 2 cm dorsal displacement of the lateral humeral fragment. There appears to be subluxation at the humeral ulnar joint. No other acute abnormalities are identified. IMPRESSION: Comminuted displaced intra-articular distal humeral fracture with probable subluxation at  the humeral ulnar joint. Electronically Signed   By: Margarette Canada M.D.   On: 08/12/2017 16:12   Ct Head Wo Contrast  Result Date: 08/12/2017 CLINICAL DATA:  76 year old female with acute fall and altered level of consciousness. EXAM: CT HEAD WITHOUT CONTRAST CT CERVICAL SPINE WITHOUT CONTRAST TECHNIQUE: Multidetector CT imaging of the head and cervical spine was performed following the standard protocol without intravenous contrast. Multiplanar CT image reconstructions of the cervical spine were also generated. COMPARISON:  04/28/2015 and prior CTs FINDINGS: CT HEAD FINDINGS Brain: No evidence of acute infarction, hemorrhage, hydrocephalus, extra-axial collection or mass lesion/mass effect. Mild chronic small-vessel white matter ischemic changes again noted. Vascular: Intracranial atherosclerotic calcifications noted Skull: Normal. Negative for fracture or focal lesion. Sinuses/Orbits: No acute finding. Other: None. CT CERVICAL SPINE FINDINGS Alignment: Normal. Skull base and vertebrae: No acute fracture. No primary bone lesion or focal pathologic process. Soft tissues and spinal canal: No prevertebral fluid or swelling. No visible canal hematoma. Disc levels: Mild multilevel degenerative disc disease, spondylosis and facet arthropathy noted. C3-4 bony fusion noted. Upper chest: No acute abnormality Other: None IMPRESSION: No evidence of acute intracranial abnormality. Mild chronic small-vessel white matter ischemic changes No static evidence of acute injury to the cervical spine. Electronically Signed   By: Margarette Canada M.D.   On: 08/12/2017 18:10   Ct Chest W Contrast  Result Date: 08/12/2017 CLINICAL DATA:  Left humerus compound fracture after falling down 3 steps onto a brick walkway. EXAM: CT CHEST, ABDOMEN, AND PELVIS WITH CONTRAST TECHNIQUE: Multidetector CT imaging of the chest, abdomen and pelvis was performed following the standard protocol during bolus administration of intravenous contrast.  CONTRAST:  158mL ISOVUE-300 IOPAMIDOL (ISOVUE-300) INJECTION 61% COMPARISON:  Abdomen pelvis CT dated 10/11/2013. Portable chest obtained earlier today. FINDINGS: CT CHEST FINDINGS Cardiovascular: Atheromatous arterial calcifications, including the coronary arteries and aorta. Borderline enlarged heart. Mediastinum/Nodes: Small hiatal hernia. No enlarged lymph nodes. Unremarkable thyroid gland. Lungs/Pleura: Mild bilateral lower lobe and right upper lobe dependent atelectasis. No pneumothorax or pleural fluid. No lung nodules. Musculoskeletal: Right shoulder prosthesis. Old, healed left humeral neck fracture. No vertebral fracture or subluxation. Thoracic and lower cervical spine degenerative changes. 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 No T6 vertebral fracture is seen. CT ABDOMEN PELVIS FINDINGS Hepatobiliary: No focal liver abnormality is seen. No gallstones, gallbladder wall thickening, or biliary dilatation. Pancreas: Duodenal diverticulum in the head of the pancreas. Otherwise, normal appearing pancreas. Spleen: Normal in size without focal abnormality. Adrenals/Urinary Tract: Elongated low-density enlargement of portions of both adrenal glands. On coronal image number 100, this measures 2.7 x 1.7 cm on the left. On coronal image number 105 this measures 3.2 x 1.43 cm on the right. Small left renal cyst. Right renal cortical scarring in the lower pole. Unremarkable ureters and urinary bladder. Stomach/Bowel: Small hiatal hernia. Unremarkable colon and small bowel. Surgically absent appendix. Vascular/Lymphatic: Atheromatous arterial calcifications without aneurysm.  No enlarged lymph nodes. Reproductive: Small uterus with calcified fibroids. No adnexal mass. Other: No abdominal wall hernia or abnormality. No abdominopelvic ascites. Musculoskeletal: L1, L3 and L4 vertebral compression deformities with minimal bony  retropulsion at the L4 level. No acute fracture lines. Lumbar spine degenerative changes. Mild right hip degenerative changes. IMPRESSION: 1. 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 2. Probable old L1, L3 and L4 vertebral compression deformities. 3. Small hiatal hernia. 4. Densely calcified coronary artery and aortic atherosclerosis. 5. Bilateral adrenal adenomas or focal hyperplasia. Electronically Signed   By: Claudie Revering M.D.   On: 08/12/2017 18:26   Ct Cervical Spine Wo Contrast  Result Date: 08/12/2017 CLINICAL DATA:  77 year old female with acute fall and altered level of consciousness. EXAM: CT HEAD WITHOUT CONTRAST CT CERVICAL SPINE WITHOUT CONTRAST TECHNIQUE: Multidetector CT imaging of the head and cervical spine was performed following the standard protocol without intravenous contrast. Multiplanar CT image reconstructions of the cervical spine were also generated. COMPARISON:  04/28/2015 and prior CTs FINDINGS: CT HEAD FINDINGS Brain: No evidence of acute infarction, hemorrhage, hydrocephalus, extra-axial collection or mass lesion/mass effect. Mild chronic small-vessel white matter ischemic changes again noted. Vascular: Intracranial atherosclerotic calcifications noted Skull: Normal. Negative for fracture or focal lesion. Sinuses/Orbits: No acute finding. Other: None. CT CERVICAL SPINE FINDINGS Alignment: Normal. Skull base and vertebrae: No acute fracture. No primary bone lesion or focal pathologic process. Soft tissues and spinal canal: No prevertebral fluid or swelling. No visible canal hematoma. Disc levels: Mild multilevel degenerative disc disease, spondylosis and facet arthropathy noted. C3-4 bony fusion noted. Upper chest: No acute abnormality Other: None IMPRESSION: No evidence of acute intracranial abnormality. Mild chronic small-vessel white matter ischemic changes No static evidence of acute injury to the cervical spine.  Electronically Signed   By: Margarette Canada M.D.   On: 08/12/2017 18:10   Ct Abdomen Pelvis W Contrast  Result Date: 08/12/2017 CLINICAL DATA:  Left humerus compound fracture after falling down 3 steps onto a brick walkway. EXAM: CT CHEST, ABDOMEN, AND PELVIS WITH CONTRAST TECHNIQUE: Multidetector CT imaging of the chest, abdomen and pelvis was performed following the standard protocol during bolus administration of intravenous contrast. CONTRAST:  130mL ISOVUE-300 IOPAMIDOL (ISOVUE-300) INJECTION 61% COMPARISON:  Abdomen pelvis CT dated 10/11/2013. Portable chest obtained earlier today. FINDINGS: CT CHEST FINDINGS Cardiovascular: Atheromatous arterial calcifications, including the coronary arteries and aorta. Borderline enlarged heart. Mediastinum/Nodes: Small hiatal hernia. No enlarged lymph nodes. Unremarkable thyroid gland. Lungs/Pleura: Mild bilateral lower lobe and right upper lobe dependent atelectasis. No pneumothorax or pleural fluid. No lung nodules. Musculoskeletal: Right shoulder prosthesis. Old, healed left humeral neck fracture. No vertebral fracture or subluxation. Thoracic and lower cervical spine degenerative changes. 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 No T6 vertebral fracture is seen. CT ABDOMEN PELVIS FINDINGS Hepatobiliary: No focal liver abnormality is seen. No gallstones, gallbladder wall thickening, or biliary dilatation. Pancreas: Duodenal diverticulum in the head of the pancreas. Otherwise, normal appearing pancreas. Spleen: Normal in size without focal abnormality. Adrenals/Urinary Tract: Elongated low-density enlargement of portions of both adrenal glands. On coronal image number 100, this measures 2.7 x 1.7 cm on the left. On coronal image number 105 this measures 3.2 x 1.43 cm on the right. Small left renal cyst. Right renal cortical scarring in the lower pole. Unremarkable ureters and  urinary bladder. Stomach/Bowel: Small hiatal hernia. Unremarkable colon and small  bowel. Surgically absent appendix. Vascular/Lymphatic: Atheromatous arterial calcifications without aneurysm. No enlarged lymph nodes. Reproductive: Small uterus with calcified fibroids. No adnexal mass. Other: No abdominal wall hernia or abnormality. No abdominopelvic ascites. Musculoskeletal: L1, L3 and L4 vertebral compression deformities with minimal bony retropulsion at the L4 level. No acute fracture lines. Lumbar spine degenerative changes. Mild right hip degenerative changes. IMPRESSION: 1. 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 2. Probable old L1, L3 and L4 vertebral compression deformities. 3. Small hiatal hernia. 4. Densely calcified coronary artery and aortic atherosclerosis. 5. Bilateral adrenal adenomas or focal hyperplasia. Electronically Signed   By: Claudie Revering M.D.   On: 08/12/2017 18:26   Dg Humerus Left  Result Date: 08/12/2017 CLINICAL DATA:  Acute left arm pain following fall today. Initial encounter. EXAM: LEFT HUMERUS - 2+ VIEW COMPARISON:  06/05/2015 radiographs FINDINGS: A comminuted displaced intraarticular fracture of the distal humerus is identified with approximately 1 shaft width dorsal displacement. There appears to be subluxation at the humeral ulnar joint. A remote fracture of the left humeral neck is noted. IMPRESSION: Comminuted displaced intraarticular fracture the distal humerus with probable disruption of the humeral ulnar joint. Electronically Signed   By: Margarette Canada M.D.   On: 08/12/2017 16:10   Dg Hand Complete Left  Result Date: 08/12/2017 CLINICAL DATA:  Acute left hand pain following fall today. Initial encounter. EXAM: LEFT HAND - COMPLETE 3+ VIEW COMPARISON:  None. FINDINGS: There is no evidence of fracture or dislocation. There is no evidence of arthropathy or other focal bone abnormality. Soft tissues are unremarkable.  IMPRESSION: Negative. Electronically Signed   By: Margarette Canada M.D.   On: 08/12/2017 16:14

## 2017-08-20 LAB — CBC
HCT: 27.5 % — ABNORMAL LOW (ref 35.0–47.0)
Hemoglobin: 9.4 g/dL — ABNORMAL LOW (ref 12.0–16.0)
MCH: 32.2 pg (ref 26.0–34.0)
MCHC: 34.3 g/dL (ref 32.0–36.0)
MCV: 93.9 fL (ref 80.0–100.0)
Platelets: 294 10*3/uL (ref 150–440)
RBC: 2.93 MIL/uL — ABNORMAL LOW (ref 3.80–5.20)
RDW: 15.2 % — AB (ref 11.5–14.5)
WBC: 13.4 10*3/uL — ABNORMAL HIGH (ref 3.6–11.0)

## 2017-08-20 LAB — BASIC METABOLIC PANEL
ANION GAP: 9 (ref 5–15)
BUN: 24 mg/dL — AB (ref 6–20)
CALCIUM: 8.8 mg/dL — AB (ref 8.9–10.3)
CO2: 30 mmol/L (ref 22–32)
Chloride: 93 mmol/L — ABNORMAL LOW (ref 101–111)
Creatinine, Ser: 0.97 mg/dL (ref 0.44–1.00)
GFR calc Af Amer: >60 mL/min (ref >60)
GFR, EST NON AFRICAN AMERICAN: 56 mL/min — AB (ref >60)
GLUCOSE: 101 mg/dL — AB (ref 65–99)
Potassium: 3.7 mmol/L (ref 3.5–5.1)
Sodium: 132 mmol/L — ABNORMAL LOW (ref 135–145)

## 2017-08-20 LAB — PROTIME-INR
INR: 1.13
PROTHROMBIN TIME: 14.4 s (ref 11.4–15.2)

## 2017-08-20 LAB — MRSA PCR SCREENING: MRSA by PCR: NEGATIVE

## 2017-08-20 LAB — LACTIC ACID, PLASMA: Lactic Acid, Venous: 1 mmol/L (ref 0.5–1.9)

## 2017-08-20 LAB — GLUCOSE, CAPILLARY
Glucose-Capillary: 137 mg/dL — ABNORMAL HIGH (ref 65–99)
Glucose-Capillary: 109 mg/dL — ABNORMAL HIGH (ref 65–99)
Glucose-Capillary: 114 mg/dL — ABNORMAL HIGH (ref 65–99)
Glucose-Capillary: 127 mg/dL — ABNORMAL HIGH (ref 65–99)

## 2017-08-20 MED ORDER — DEXTROSE 5 % IV SOLN
2.0000 g | Freq: Three times a day (TID) | INTRAVENOUS | Status: DC
  Filled 2017-08-20 (×3): qty 2

## 2017-08-20 MED ORDER — SODIUM CHLORIDE 0.9% FLUSH
3.0000 mL | Freq: Two times a day (BID) | INTRAVENOUS | Status: DC
  Administered 2017-08-20 – 2017-08-21 (×3): 3 mL via INTRAVENOUS

## 2017-08-20 MED ORDER — SODIUM CHLORIDE 0.9% FLUSH
3.0000 mL | INTRAVENOUS | Status: DC | PRN
Start: 2017-08-20 — End: 2017-08-21
  Administered 2017-08-20 – 2017-08-21 (×3): 3 mL via INTRAVENOUS
  Filled 2017-08-20 (×2): qty 3

## 2017-08-20 MED ORDER — WARFARIN SODIUM 5 MG PO TABS
5.0000 mg | ORAL_TABLET | Freq: Every day | ORAL | Status: DC
  Administered 2017-08-20: 18:00:00 5 mg via ORAL
  Filled 2017-08-20 (×2): qty 1

## 2017-08-20 MED ORDER — FLUOXETINE HCL 20 MG PO CAPS
40.0000 mg | ORAL_CAPSULE | Freq: Every day | ORAL | Status: DC
  Administered 2017-08-20 – 2017-08-21 (×2): 40 mg via ORAL
  Filled 2017-08-20 (×2): qty 2

## 2017-08-20 MED ORDER — ONDANSETRON HCL 4 MG PO TABS: 4.0000 mg | ORAL_TABLET | Freq: Four times a day (QID) | ORAL | Status: DC | PRN

## 2017-08-20 MED ORDER — VANCOMYCIN HCL 10 G IV SOLR
1250.0000 mg | INTRAVENOUS | Status: DC
  Administered 2017-08-20: 04:00:00 1250 mg via INTRAVENOUS
  Filled 2017-08-20 (×2): qty 1250

## 2017-08-20 MED ORDER — PREDNISONE 10 MG PO TABS
10.0000 mg | ORAL_TABLET | Freq: Every day | ORAL | Status: DC
  Administered 2017-08-20 – 2017-08-21 (×2): 10 mg via ORAL
  Filled 2017-08-20 (×2): qty 1

## 2017-08-20 MED ORDER — WARFARIN - PHARMACIST DOSING INPATIENT
Freq: Every day | Status: DC
  Administered 2017-08-20: 18:00:00 5

## 2017-08-20 MED ORDER — INSULIN ASPART 100 UNIT/ML ~~LOC~~ SOLN: 0.0000 [IU] | Freq: Every day | SUBCUTANEOUS | Status: DC

## 2017-08-20 MED ORDER — SPIRONOLACTONE 25 MG PO TABS
25.0000 mg | ORAL_TABLET | Freq: Every day | ORAL | Status: DC
  Administered 2017-08-20 – 2017-08-21 (×2): 25 mg via ORAL
  Filled 2017-08-20 (×2): qty 1

## 2017-08-20 MED ORDER — FUROSEMIDE 40 MG PO TABS
40.0000 mg | ORAL_TABLET | Freq: Two times a day (BID) | ORAL | Status: DC
  Administered 2017-08-20 – 2017-08-21 (×3): 40 mg via ORAL
  Filled 2017-08-20 (×3): qty 1

## 2017-08-20 MED ORDER — IPRATROPIUM-ALBUTEROL 0.5-2.5 (3) MG/3ML IN SOLN
3.0000 mL | Freq: Four times a day (QID) | RESPIRATORY_TRACT | Status: DC | PRN
  Administered 2017-08-21: 09:00:00 3 mL via RESPIRATORY_TRACT
  Filled 2017-08-20: qty 3

## 2017-08-20 MED ORDER — PRIMIDONE 50 MG PO TABS
50.0000 mg | ORAL_TABLET | Freq: Two times a day (BID) | ORAL | Status: DC
  Administered 2017-08-20 – 2017-08-21 (×3): 50 mg via ORAL
  Filled 2017-08-20 (×5): qty 1

## 2017-08-20 MED ORDER — BUPROPION HCL ER (XL) 150 MG PO TB24
150.0000 mg | ORAL_TABLET | Freq: Every day | ORAL | Status: DC
  Filled 2017-08-20: qty 1

## 2017-08-20 MED ORDER — LEVOFLOXACIN IN D5W 750 MG/150ML IV SOLN
750.0000 mg | INTRAVENOUS | Status: DC
  Administered 2017-08-20: 750 mg via INTRAVENOUS
  Filled 2017-08-20 (×2): qty 150

## 2017-08-20 MED ORDER — HYDROXYZINE HCL 25 MG PO TABS
25.0000 mg | ORAL_TABLET | Freq: Two times a day (BID) | ORAL | Status: DC | PRN
  Administered 2017-08-21 (×2): 25 mg via ORAL
  Filled 2017-08-20 (×4): qty 1

## 2017-08-20 MED ORDER — SENNOSIDES-DOCUSATE SODIUM 8.6-50 MG PO TABS
2.0000 | ORAL_TABLET | Freq: Every day | ORAL | Status: DC
  Administered 2017-08-20 – 2017-08-21 (×2): 2 via ORAL
  Filled 2017-08-20 (×2): qty 2

## 2017-08-20 MED ORDER — ACETAMINOPHEN 650 MG RE SUPP
650.0000 mg | Freq: Four times a day (QID) | RECTAL | Status: DC | PRN
Start: 2017-08-20 — End: 2017-08-21

## 2017-08-20 MED ORDER — ACETAMINOPHEN 325 MG PO TABS
650.0000 mg | ORAL_TABLET | Freq: Four times a day (QID) | ORAL | Status: DC | PRN
  Administered 2017-08-20 – 2017-08-21 (×5): 650 mg via ORAL
  Filled 2017-08-20 (×5): qty 2

## 2017-08-20 MED ORDER — ONDANSETRON HCL 4 MG/2ML IJ SOLN: 4.0000 mg | Freq: Four times a day (QID) | INTRAMUSCULAR | Status: DC | PRN

## 2017-08-20 MED ORDER — LIOTHYRONINE SODIUM 25 MCG PO TABS
25.0000 ug | ORAL_TABLET | Freq: Every day | ORAL | Status: DC
  Administered 2017-08-20 – 2017-08-21 (×2): 25 ug via ORAL
  Filled 2017-08-20 (×2): qty 1

## 2017-08-20 MED ORDER — SIMETHICONE 80 MG PO CHEW
80.0000 mg | CHEWABLE_TABLET | Freq: Four times a day (QID) | ORAL | Status: DC | PRN
  Administered 2017-08-21: 80 mg via ORAL
  Filled 2017-08-20 (×2): qty 1

## 2017-08-20 MED ORDER — SIMVASTATIN 20 MG PO TABS
40.0000 mg | ORAL_TABLET | Freq: Every day | ORAL | Status: DC
  Administered 2017-08-20: 22:00:00 40 mg via ORAL
  Filled 2017-08-20: qty 2

## 2017-08-20 MED ORDER — INSULIN ASPART 100 UNIT/ML ~~LOC~~ SOLN
0.0000 [IU] | Freq: Three times a day (TID) | SUBCUTANEOUS | Status: DC
  Administered 2017-08-20: 18:00:00 1 [IU] via SUBCUTANEOUS
  Administered 2017-08-21: 2 [IU] via SUBCUTANEOUS
  Filled 2017-08-20 (×2): qty 1

## 2017-08-20 MED ORDER — TRAMADOL HCL 50 MG PO TABS
50.0000 mg | ORAL_TABLET | Freq: Four times a day (QID) | ORAL | Status: DC | PRN
  Administered 2017-08-20 (×3): 50 mg via ORAL
  Filled 2017-08-20 (×3): qty 1

## 2017-08-20 MED ORDER — OXYCODONE HCL 5 MG PO TABS
5.0000 mg | ORAL_TABLET | Freq: Four times a day (QID) | ORAL | Status: DC | PRN
  Administered 2017-08-20 – 2017-08-21 (×3): 5 mg via ORAL
  Filled 2017-08-20 (×4): qty 1

## 2017-08-20 MED ORDER — GUAIFENESIN-DM 100-10 MG/5ML PO SYRP
10.0000 mL | ORAL_SOLUTION | Freq: Four times a day (QID) | ORAL | Status: DC | PRN
  Filled 2017-08-20: qty 10

## 2017-08-20 MED ORDER — MONTELUKAST SODIUM 10 MG PO TABS
10.0000 mg | ORAL_TABLET | Freq: Every day | ORAL | Status: DC
  Administered 2017-08-20: 22:00:00 10 mg via ORAL
  Filled 2017-08-20: qty 1

## 2017-08-20 NOTE — Progress Notes (Addendum)
Reports she does experience anxiety and is concerned about her current situation with her fractured arm; emotional support/education done with improvement. Declined prn for anxiety at this time. Educated pt on pain scale with improved reporting and accuracy. Tylenol effective for right back pain with pt sleeping long nap. Tramadol given x 1 prn for right sided back which pt reports is feeling better this afternoon. LUE splint/support maintained left hand fingers pink, warm, with capillary refill <3 sec, normal sensation.  Weaned 02 to 2l/[place] which she reports she only wears at night and PRN during the day.

## 2017-08-20 NOTE — Clinical Social Work Note (Signed)
Clinical Social Work Assessment  Patient Details  Name: Sara Pierce MRN: [id number] Date of Birth: [birth year]  Date of referral:  08/20/17               Reason for consult:  Facility Placement                Permission sought to share information with:  Chartered certified accountant granted to share information::  Yes, Verbal Permission Granted  Name::        Agency::  [facility]  Relationship::  SNF  Contact Information:     Housing/Transportation Living arrangements for the past 2 months:  [place], [place] (In SNF currently for STR) Source of Information:  Patient, Medical Team, Facility Patient Interpreter Needed:  None Criminal Activity/Legal Involvement Pertinent to Current Situation/Hospitalization:  No - Comment as needed Significant Relationships:  Warehouse manager, Friend, Spouse Lives with:  Spouse Do you feel safe going back to the place where you live?  Yes Need for family participation in patient care:  No (Coment)  Care giving concerns:  Patient admitted from [facility]   Social Worker assessment / plan:  The CSW met with the patient at bedside to discuss discharge planning. The patient gave verbal permission to contact [facility], and she reported that she wants to return to continue STR. The CSW contacted Amy at [facility] who confirmed that the patient is STR and can return tomorrow if stable. The CSW will continue to follow to facilitate discharge.  Employment status:  Retired Forensic scientist:  Medicare PT Recommendations:  Not assessed at this time Information / Referral to community resources:     Patient/Family's Response to care:  The patient thanked the CSW for care and attention.  Patient/Family's Understanding of and Emotional Response to Diagnosis, Current Treatment, and Prognosis:  The patient is in agreement with the discharge plan to return to [facility].  Emotional  Assessment Appearance:  Appears stated age Attitude/Demeanor/Rapport:   (Pleasant) Affect (typically observed):  Pleasant, Appropriate Orientation:  Oriented to Self, Oriented to  Time, Oriented to Place, Oriented to Situation Alcohol / Substance use:  Never Used Psych involvement (Current and /or in the community):  No (Comment)  Discharge Needs  Concerns to be addressed:  Care Coordination, Discharge Planning Concerns Readmission within the last 30 days:  No Current discharge risk:  None Barriers to Discharge:  Continued Medical Work up   [employer], LCSW 08/20/2017, 4:04 PM

## 2017-08-20 NOTE — ED Notes (Signed)
Eliezer Lofts, RN notified that bed is assigned

## 2017-08-20 NOTE — NC FL2 (Signed)
[place] LEVEL OF CARE SCREENING TOOL     IDENTIFICATION  Patient Name: Sara Pierce Birthdate: 06/22/1941 Sex: female Admission Date (Current Location): 08/19/2017  [place] and [place] Number:  Engineering geologist and Address:  [facility], [address], [place], [place] [address]      Provider Number: [id number]  Attending Physician Name and Address:  Hillary Bow, MD  Relative Name and Phone Number:  Dilcia Rybarczyk (Spouse) [phone]    Current Level of Care: Hospital Recommended Level of Care: [place] Prior Approval Number:    Date Approved/Denied: 02/11/14 PASRR Number: [id number]  Discharge Plan: SNF    Current Diagnoses: Patient Active Problem List   Diagnosis Date Noted  . HCAP (healthcare-associated pneumonia) 08/19/2017  . CAP (community acquired pneumonia) 10/03/2016  . Sepsis ([place]) 11/15/2015  . Absolute anemia 03/15/2015  . Anxiety 03/15/2015  . Arthritis 03/15/2015  . 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 Cataract 03/15/2015  . Brain bleed ([place]) 03/15/2015  . Chronic constipation 03/15/2015  . Chronic kidney failure 03/15/2015  . Blood clotting disorder ( [place]) 03/15/2015  . CAFL (chronic airflow limitation) ([place]) 03/15/2015  . Clinical depression 03/15/2015  . Diabetes ([place]) 03/15/2015  . H/O varicella 03/15/2015  . BP (high blood pressure) 03/15/2015  . Mixed incontinence 03/15/2015  . 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 Adiposity 03/15/2015  . Apnea, sleep 03/15/2015  . Disease of thyroid gland 03/15/2015  . FOM (frequency of micturition) 03/15/2015  . 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 Candida vaginitis 03/15/2015  . Abnormal ECG 03/15/2015    Orientation RESPIRATION BLADDER Height & Weight     Self, Time, Situation, Place  Normal Continent Weight: 244 lb 12.8 oz (111 kg) Height:  5\' 5"  (165.1 cm)  BEHAVIORAL SYMPTOMS/MOOD  NEUROLOGICAL BOWEL NUTRITION STATUS      Continent Diet (Heart healthy, carb modified)  AMBULATORY STATUS COMMUNICATION OF NEEDS Skin   Extensive Assist Verbally Surgical wounds                       Personal Care Assistance Level of Assistance  Bathing, Feeding, Dressing Bathing Assistance: Maximum assistance Feeding assistance: Independent Dressing Assistance: Maximum assistance     Functional Limitations Info             SPECIAL CARE FACTORS FREQUENCY  PT (By licensed PT)     PT Frequency: Resume PT as ordered prior to admission to hospital              Contractures Contractures Info: Not present    Additional Factors Info  Code Status, Allergies, Psychotropic Code Status Info: Full Allergies Info: Aspirin, Codeine, Tape Psychotropic Info: Prozac, Wellbutrin         Current Medications (08/20/2017):  This is the current hospital active medication list Current Facility-Administered Medications  Medication Dose Route Frequency Provider Last Rate Last Dose  . acetaminophen (TYLENOL) tablet 650 mg  650 mg Oral Q6H PRN Lance Coon, MD   650 mg at 08/20/17 1029   Or  . acetaminophen (TYLENOL) suppository 650 mg  650 mg Rectal Q6H PRN Lance Coon, MD      . FLUoxetine (PROZAC) capsule 40 mg  40 mg Oral Daily Lance Coon, MD   40 mg at 08/20/17 1027  . furosemide (LASIX) tablet 40 mg  40 mg Oral BID Lance Coon, MD   40 mg at 08/20/17 1029  . guaiFENesin-dextromethorphan (ROBITUSSIN DM)  100-10 MG/5ML syrup 10 mL  10 mL Oral Q6H PRN Lance Coon, MD      . hydrOXYzine (ATARAX/VISTARIL) tablet 25 mg  25 mg Oral BID PRN Lance Coon, MD      . insulin aspart (novoLOG) injection 0-5 Units  0-5 Units Subcutaneous QHS Lance Coon, MD      . insulin aspart (novoLOG) injection 0-9 Units  0-9 Units Subcutaneous TID WC Lance Coon, MD      . ipratropium-albuterol (DUONEB) 0.5-2.5 (3) MG/3ML nebulizer solution 3 mL  3 mL Inhalation QID PRN Lance Coon, MD       . levofloxacin (LEVAQUIN) IVPB 750 mg  750 mg Intravenous Q24H Sudini, Srikar, MD      . liothyronine (CYTOMEL) tablet 25 mcg  25 mcg Oral Daily Lance Coon, MD   25 mcg at 08/20/17 1028  . montelukast (SINGULAIR) tablet 10 mg  10 mg Oral QHS Lance Coon, MD      . ondansetron [facility]) tablet 4 mg  4 mg Oral Q6H PRN Lance Coon, MD       Or  . ondansetron [facility]) injection 4 mg  4 mg Intravenous Q6H PRN Lance Coon, MD      . oxyCODONE (Oxy IR/ROXICODONE) immediate release tablet 5 mg  5 mg Oral Q6H PRN Lance Coon, MD   5 mg at 08/20/17 0427  . predniSONE (DELTASONE) tablet 10 mg  10 mg Oral Q breakfast Lance Coon, MD   10 mg at 08/20/17 1028  . primidone (MYSOLINE) tablet 50 mg  50 mg Oral BID Lance Coon, MD   50 mg at 08/20/17 1027  . senna-docusate (Senokot-S) tablet 2 tablet  2 tablet Oral Daily Lance Coon, MD   2 tablet at 08/20/17 1028  . simvastatin (ZOCOR) tablet 40 mg  40 mg Oral QHS Lance Coon, MD      . sodium chloride flush (NS) 0.9 % injection 3 mL  3 mL Intravenous Q12H Hillary Bow, MD   3 mL at 08/20/17 1032  . sodium chloride flush (NS) 0.9 % injection 3 mL  3 mL Intravenous PRN Hillary Bow, MD   3 mL at 08/20/17 1033  . spironolactone (ALDACTONE) tablet 25 mg  25 mg Oral Daily Lance Coon, MD   25 mg at 08/20/17 1028  . traMADol (ULTRAM) tablet 50 mg  50 mg Oral Q6H PRN Lance Coon, MD   50 mg at 08/20/17 1541  . warfarin (COUMADIN) tablet 5 mg  5 mg Oral q1800 Lance Coon, MD      . Warfarin - Pharmacist Dosing Inpatient   Does not apply q1800 Lance Coon, MD         Discharge Medications: Please see discharge summary for a list of discharge medications.  Relevant Imaging Results:  Relevant Lab Results:   Additional Information SS# [id number]  Zettie Pho, LCSW

## 2017-08-20 NOTE — Clinical Social Work Note (Signed)
CSW received consult for discharge planning. The patient is a resident of [place] and may discharge as soon as tomorrow pending permission from [facility]. CSW will assess when able.  Santiago Bumpers, MSW, Latanya Presser [phone]

## 2017-08-20 NOTE — H&P (Signed)
[facility] [place] NAME: Sara Pierce    MR#:  [id number]  DATE OF BIRTH:  June 14, 1941  DATE OF ADMISSION:  08/19/2017  PRIMARY CARE PHYSICIAN: Abelardo Diesel, NP   REQUESTING/REFERRING PHYSICIAN: Alfred Levins, MD  CHIEF COMPLAINT:   Chief Complaint  Patient presents with  . Arm Pain  . Cough    HISTORY OF PRESENT ILLNESS:  Sara Pierce  is a 76 y.o. female who presents with cough, shortness of breath, significant pain. Patient recently had an operation for fracture repair. Here in the ED tonight she was found to have significantly elevated white blood cell count, with a strong suspicion for pneumonia. She met sepsis criteria initially, hospitalist called for admission  PAST MEDICAL HISTORY:   Past Medical History:  Diagnosis Date  . Arthritis   . Cardiomyopathy ([place])   . Cataract   . Cerebral hemorrhage ([place])   . Chronic kidney disease   . Clotting disorder ([place])   . COPD (chronic obstructive pulmonary disease) ([place])   . Depression   . Diabetes mellitus without complication ([place])   . Hypertension   . Mixed incontinence   . Obesity   . Sleep apnea   . Thyroid disease   . Urinary frequency   . Vaginal atrophy   . Varicose veins     PAST SURGICAL HISTORY:   Past Surgical History:  Procedure Laterality Date  . APPENDECTOMY    . Cardiac Catherization    . COLONOSCOPY WITH PROPOFOL N/A 04/28/2016   Procedure: COLONOSCOPY WITH PROPOFOL;  Surgeon: Manya Silvas, MD;  Location: [facility] ENDOSCOPY;  Service: Endoscopy;  Laterality: N/A;  . COLONOSCOPY WITH PROPOFOL N/A 04/29/2016   Procedure: COLONOSCOPY WITH PROPOFOL;  Surgeon: Manya Silvas, MD;  Location: [facility] ENDOSCOPY;  Service: Endoscopy;  Laterality: N/A;  . JOINT REPLACEMENT    . STRABISMUS SURGERY    . TONSILLECTOMY    . TUBAL LIGATION      SOCIAL HISTORY:   Social History  Substance Use Topics  . Smoking status: Former Smoker    Packs/day: 0.50    Years:  44.00    Types: Cigarettes  . Smokeless tobacco: Never Used  . Alcohol use No    FAMILY HISTORY:   Family History  Problem Relation Age of Onset  . Heart attack Father   . Coronary artery disease Father   . Hypertension Father   . Hyperlipidemia Father   . Heart attack Mother   . Hypertension Mother   . Hyperlipidemia Mother   . Breast cancer Neg Hx     DRUG ALLERGIES:   Allergies  Allergen Reactions  . Aspirin Swelling  . Codeine Itching  . Tape Hives    MEDICATIONS AT HOME:   Prior to Admission medications   Medication Sig Start Date End Date Taking? Authorizing Provider  acetaminophen (TYLENOL) 650 MG CR tablet Take 650 mg by mouth every 8 (eight) hours as needed for pain.   Yes [provider]  albuterol (PROVENTIL HFA;VENTOLIN HFA) 108 (90 Base) MCG/ACT inhaler Inhale 2 puffs into the lungs every 6 (six) hours as needed for wheezing or shortness of breath. 06/15/17  Yes Alfred Levins, [place], MD  albuterol (PROVENTIL) (2.5 MG/3ML) 0.083% nebulizer solution Take 2.5 mg by nebulization every 4 (four) hours as needed for wheezing or shortness of breath.   Yes [provider]  alum & mag hydroxide-simeth (MAALOX/MYLANTA) 200-200-20 MG/5ML suspension Take 15 mLs by mouth every 6 (six) hours as  needed for indigestion or heartburn.   Yes [provider]  buPROPion (WELLBUTRIN XL) 150 MG 24 hr tablet Take 150 mg by mouth daily.   Yes [provider]  FLUoxetine (PROZAC) 40 MG capsule Take 40 mg by mouth daily.   Yes [provider]  fluticasone (FLONASE) 50 MCG/ACT nasal spray Place 1 spray into both nostrils daily.   Yes [provider]  furosemide (LASIX) 40 MG tablet Take 40 mg by mouth 2 (two) times daily.   Yes [provider]  guaiFENesin-dextromethorphan (ROBITUSSIN DM) 100-10 MG/5ML syrup Take 10 mLs by mouth every 6 (six) hours as needed for cough. 10/04/16  Yes Gouru, Illene Silver, MD  hydrOXYzine (ATARAX/VISTARIL) 25  MG tablet Take 25 mg by mouth 2 (two) times daily as needed for anxiety.   Yes [provider]  ipratropium-albuterol (DUONEB) 0.5-2.5 (3) MG/3ML SOLN Inhale 3 mLs into the lungs 4 (four) times daily as needed (wheezing).    Yes [provider]  liothyronine (CYTOMEL) 25 MCG tablet Take 25 mcg by mouth daily.    Yes [provider]  magnesium oxide (MAG-OX) 400 MG tablet Take 400 mg by mouth daily.    Yes [provider]  montelukast (SINGULAIR) 10 MG tablet Take 10 mg by mouth at bedtime.    Yes [provider]  oxyCODONE (OXY IR/ROXICODONE) 5 MG immediate release tablet Take 5 mg by mouth every 6 (six) hours as needed for moderate pain, severe pain or breakthrough pain.   Yes [provider]  potassium chloride (K-DUR) 10 MEQ tablet Take 10 mEq by mouth daily.    Yes [provider]  predniSONE (DELTASONE) 10 MG tablet Take 10 mg by mouth daily with breakfast.   Yes [provider]  primidone (MYSOLINE) 50 MG tablet Take 50 mg by mouth 2 (two) times daily.    Yes [provider]  senna-docusate (SENOKOT-S) 8.6-50 MG tablet Take 2 tablets by mouth daily.   Yes [provider]  simvastatin (ZOCOR) 40 MG tablet Take 40 mg by mouth at bedtime.    Yes [provider]  spironolactone (ALDACTONE) 25 MG tablet Take 1 tablet (25 mg total) by mouth daily. 11/19/15  Yes Gladstone Lighter, MD  traMADol (ULTRAM) 50 MG tablet Take 50 mg by mouth every 6 (six) hours as needed for moderate pain.   Yes [provider]  umeclidinium-vilanterol (ANORO ELLIPTA) 62.5-25 MCG/INH AEPB Inhale 1 puff into the lungs daily.   Yes [provider]  docusate sodium (COLACE) 100 MG capsule Take 1 capsule (100 mg total) by mouth 2 (two) times daily as needed for mild constipation. Patient not taking: Reported on 08/19/2017 10/04/16   Nicholes Mango, MD  levofloxacin (LEVAQUIN) 500 MG tablet Take 1 tablet (500 mg total)  by mouth daily. Patient not taking: Reported on 08/19/2017 10/05/16   Nicholes Mango, MD  oxyCODONE-acetaminophen (ROXICET) 5-325 MG tablet Take 1 tablet by mouth every 6 (six) hours as needed for severe pain. Patient not taking: Reported on 08/19/2017 08/19/17   Darel Hong, MD  sodium chloride (OCEAN) 0.65 % SOLN nasal spray Place 2 sprays into both nostrils 3 (three) times daily as needed for congestion. Patient not taking: Reported on 08/19/2017 10/04/16   Nicholes Mango, MD  torsemide (DEMADEX) 20 MG tablet Take 1 tablet (20 mg total) by mouth 2 (two) times daily. Patient not taking: Reported on 08/19/2017 10/04/16   Nicholes Mango, MD    REVIEW OF SYSTEMS:  Review of  Systems  Constitutional: Negative for chills, fever, malaise/fatigue and weight loss.  HENT: Negative for ear pain, hearing loss and tinnitus.   Eyes: Negative for blurred vision, double vision, pain and redness.  Respiratory: Positive for cough and shortness of breath. Negative for hemoptysis.   Cardiovascular: Negative for chest pain, palpitations, orthopnea and leg swelling.  Gastrointestinal: Negative for abdominal pain, constipation, diarrhea, nausea and vomiting.  Genitourinary: Negative for dysuria, frequency and hematuria.  Musculoskeletal: Negative for back pain, joint pain and neck pain.  Skin:       No acne, rash, or lesions  Neurological: Negative for dizziness, tremors, focal weakness and weakness.  Endo/Heme/Allergies: Negative for polydipsia. Does not bruise/bleed easily.  Psychiatric/Behavioral: Negative for depression. The patient is not nervous/anxious and does not have insomnia.      VITAL SIGNS:   Vitals:   08/19/17 2230 08/19/17 2315 08/19/17 2330 08/19/17 2344  BP:      Pulse: 74 70 67 69  Resp:   15 18  Temp:      SpO2: 95% 100% 100% 96%  Weight:      Height:       Wt Readings from Last 3 Encounters:  08/19/17 112 kg (247 lb)  08/19/17 112 kg (247 lb)  08/12/17 113.4 kg (250 lb)    PHYSICAL  EXAMINATION:  Physical Exam  Vitals reviewed. Constitutional: She is oriented to person, place, and time. She appears well-developed and well-nourished. No distress.  HENT:  Head: Normocephalic and atraumatic.  Mouth/Throat: Oropharynx is clear and moist.  Eyes: Pupils are equal, round, and reactive to light. Conjunctivae and EOM are normal. No scleral icterus.  Neck: Normal range of motion. Neck supple. No JVD present. No thyromegaly present.  Cardiovascular: Normal rate, regular rhythm and intact distal pulses.  Exam reveals no gallop and no friction rub.   No murmur heard. Respiratory: Effort normal. No respiratory distress. She has no wheezes. She has no rales.  right sided ronchi  GI: Soft. Bowel sounds are normal. She exhibits no distension. There is no tenderness.  Musculoskeletal: Normal range of motion. She exhibits no edema.  No arthritis, no gout  Lymphadenopathy:    She has no cervical adenopathy.  Neurological: She is alert and oriented to person, place, and time. No cranial nerve deficit.  No dysarthria, no aphasia  Skin: Skin is warm and dry. No rash noted. No erythema.  Psychiatric: She has a normal mood and affect. Her behavior is normal. Judgment and thought content normal.    LABORATORY PANEL:   CBC  Recent Labs Lab 08/19/17 2140  WBC 20.2*  HGB 10.4*  HCT 29.9*  PLT 364   ------------------------------------------------------------------------------------------------------------------  Chemistries   Recent Labs Lab 08/19/17 0342 08/19/17 2140  NA 134* 133*  K 4.1 4.3  CL 93* 92*  CO2 30 29  GLUCOSE 122* 153*  BUN 22* 23*  CREATININE 0.90 1.01*  CALCIUM 9.0 9.3  AST 22  --   ALT 16  --   ALKPHOS 74  --   BILITOT 0.7  --    ------------------------------------------------------------------------------------------------------------------  Cardiac Enzymes  Recent Labs Lab 08/19/17 2140  TROPONINI <0.03    ------------------------------------------------------------------------------------------------------------------  RADIOLOGY:  Dg Elbow Complete Left  Result Date: 08/19/2017 CLINICAL DATA:  Patient fell with fracture dislocation of the left elbow on 08/12/2017. Now with worsening pain after repair. EXAM: LEFT ELBOW - COMPLETE 3+ VIEW COMPARISON:  08/12/2017 FINDINGS: Cast material is present which obscures bone detail. There has been interval plate and  screw fixation of fractures of the distal humerus and proximal ulna. Fracture lines remain visible. Fragmentation of the supracondylar region of the humerus. Fracture fragments appear to be in near anatomic position. There is elevation of the distal aspect of the medial humeral plate. Screws appear flush with the plates. No prior postoperative views are available for comparison. IMPRESSION: Postoperative plate and screw fixation of the distal humerus and proximal ulna with cast placement. Fracture lines remain visible although the bones appear to be in near anatomic position. Correlation with any prior postoperative views would be useful to look for any interval change. Electronically Signed   By: Lucienne Capers M.D.   On: 08/19/2017 04:16   Dg Chest Portable 1 View  Result Date: 08/19/2017 CLINICAL DATA:  Productive cough. EXAM: PORTABLE CHEST 1 VIEW COMPARISON:  Radiograph and CT 08/12/2017 FINDINGS: Unchanged heart size and mediastinal contours with stable cardiomegaly atherosclerosis. No pulmonary edema, confluent consolidation, large pleural effusion or pneumothorax. Remote left proximal humerus fracture and right humeral prosthesis partially included. IMPRESSION: Stable cardiomegaly without acute abnormality. Electronically Signed   By: Jeb Levering M.D.   On: 08/19/2017 22:19    EKG:   Orders placed or performed during the hospital encounter of 08/19/17  . ED EKG within 10 minutes  . ED EKG within 10 minutes  . EKG 12-Lead  . EKG  12-Lead    IMPRESSION AND PLAN:  Principal Problem:   Sepsis ([place]) - broad-spectrum IV antibiotics, patient is hemodynamically stable, cultures sent Active Problems:   HCAP (healthcare-associated pneumonia) - antibiotics and supportive treatments   Diabetes ([place]) - sliding scale insulin with corresponding glucose checks   BP (high blood pressure) - continue home meds   Blood clotting disorder (H[facility]) - continue warfarin  All the records are reviewed and case discussed with ED provider. Management plans discussed with the patient and/or family.  DVT PROPHYLAXIS: Systemic anticoagulation  GI PROPHYLAXIS: None  ADMISSION STATUS: Inpatient  CODE STATUS: Full Code Status History    Date Active Date Inactive Code Status Order ID Comments User Context   10/03/2016  5:40 AM 10/04/2016  5:11 PM Full Code [id number]  Harrie Foreman, MD Inpatient   11/15/2015 10:23 PM 11/19/2015  3:14 PM Full Code [id number]  Hower, Aaron Mose, MD ED      TOTAL TIME TAKING CARE OF THIS PATIENT: 45 minutes.   Sara Pierce FIELDING 08/20/2017, 12:22 AM  [facility]  Office  [phone]  CC: Primary care physician; Abelardo Diesel, NP  Note:  This document was prepared using Dragon voice recognition software and may include unintentional dictation errors.

## 2017-08-20 NOTE — Progress Notes (Signed)
Pharmacy Antibiotic Note  Sara Pierce is a 76 y.o. female admitted on 08/19/2017 with pneumonia.  Pharmacy has been consulted for vancomycin and cefepime dosing.  Plan: DW 79kg  Vd 55L kei 0.055 hr-1  T1/2 13 hours Vancomycin 1250 mg q 18 hours ordered with stacked dosing. Level before 5th dose. Goal rough 15-20.  Cefepime 2 grams q 8 hours ordered.  Height: 5\' 5"  (165.1 cm) Weight: 244 lb 12.8 oz (111 kg) IBW/kg (Calculated) : 57  Temp (24hrs), Avg:98.3 F (36.8 C), Min:98.2 F (36.8 C), Max:98.4 F (36.9 C)   Recent Labs Lab 08/19/17 0342 08/19/17 2140 08/19/17 2314  WBC 11.5* 20.2*  --   CREATININE 0.90 1.01*  --   LATICACIDVEN  --   --  1.0    Estimated Creatinine Clearance: 60.6 mL/min (A) (by C-G formula based on SCr of 1.01 mg/dL (H)).    Allergies  Allergen Reactions  . Aspirin Swelling  . Codeine Itching  . Tape Hives    Antimicrobials this admission: Vancomycin, cefepime 9/7  >>    >>   Dose adjustments this admission:   Microbiology results:       9/7 CXR: no acute abnormality 9/7 UA: LE(+) NO2(-)  WBC 6-30  Thank you for allowing pharmacy to be a part of this patient'Pierce care.  Sara Pierce 08/20/2017 2:02 AM

## 2017-08-20 NOTE — Progress Notes (Signed)
ANTICOAGULATION CONSULT NOTE - Initial Consult  Pharmacy Consult for Warfarin Indication: VTE prophylaxis  Allergies  Allergen Reactions  . Aspirin Swelling  . Codeine Itching  . Tape Hives    Patient Measurements: Height: 5\' 5"  (165.1 cm) Weight: 244 lb 12.8 oz (111 kg) IBW/kg (Calculated) : 57  Vital Signs: Temp: 98.4 F (36.9 C) (09/08 0135) Temp Source: Oral (09/08 0135) BP: 143/59 (09/08 0135) Pulse Rate: 65 (09/08 0135)  Labs:  Recent Labs  08/19/17 0342 08/19/17 2140 08/20/17 0432  HGB 9.8* 10.4* 9.4*  HCT 28.4* 29.9* 27.5*  PLT 296 364 294  LABPROT  --   --  14.4  INR  --   --  1.13  CREATININE 0.90 1.01* 0.97  CKTOTAL 51  --   --   TROPONINI  --  <0.03  --     Estimated Creatinine Clearance: 63.1 mL/min (by C-G formula based on SCr of 0.97 mg/dL).   Medical History: Past Medical History:  Diagnosis Date  . Arthritis   . Cardiomyopathy ([place])   . Cataract   . Cerebral hemorrhage ([place])   . Chronic kidney disease   . Clotting disorder ([place])   . COPD (chronic obstructive pulmonary disease) ([place])   . Depression   . Diabetes mellitus without complication ([place])   . Hypertension   . Mixed incontinence   . Obesity   . Sleep apnea   . Thyroid disease   . Urinary frequency   . Vaginal atrophy   . Varicose veins     Medications:  Scheduled:  . FLUoxetine  40 mg Oral Daily  . furosemide  40 mg Oral BID  . insulin aspart  0-5 Units Subcutaneous QHS  . insulin aspart  0-9 Units Subcutaneous TID WC  . liothyronine  25 mcg Oral Daily  . montelukast  10 mg Oral QHS  . predniSONE  10 mg Oral Q breakfast  . primidone  50 mg Oral BID  . senna-docusate  2 tablet Oral Daily  . simvastatin  40 mg Oral QHS  . spironolactone  25 mg Oral Daily  . warfarin  5 mg Oral q1800  . Warfarin - Pharmacist Dosing Inpatient   Does not apply q1800   Infusions:  . levofloxacin (LEVAQUIN) IV      Assessment: 76 y.o. female, postop day 6 from a supracondylar  fracture repair, ordered warfarin for VTE prophylaxis. Patient is on levofloxacin 750mg  IV daily.   9/8  INR  1.13   Goal of Therapy:  INR 2-3 Monitor platelets by anticoagulation protocol: Yes   Plan:  Warfarin 5mg  PO daily at 18:00. PT/INR with am labs.   Olivia Canter, RPH 08/20/2017,8:27 AM

## 2017-08-20 NOTE — Progress Notes (Signed)
Same-day note  Patient has no trouble breathing. No infiltrates on chest x-ray. Not pneumonia. Patient has acute bronchitis with UTI. Change to Levaquin. Continue pain medications as needed.  Likely discharge tomorrow.

## 2017-08-21 LAB — CBC WITH DIFFERENTIAL/PLATELET
Basophils Absolute: 0.1 10*3/uL (ref 0–0.1)
Basophils Relative: 0 %
EOS PCT: 1 %
Eosinophils Absolute: 0.1 10*3/uL (ref 0–0.7)
HEMATOCRIT: 27.1 % — AB (ref 35.0–47.0)
Hemoglobin: 9.3 g/dL — ABNORMAL LOW (ref 12.0–16.0)
LYMPHS ABS: 1.2 10*3/uL (ref 1.0–3.6)
LYMPHS PCT: 9 %
MCH: 32.1 pg (ref 26.0–34.0)
MCHC: 34.3 g/dL (ref 32.0–36.0)
MCV: 93.6 fL (ref 80.0–100.0)
MONO ABS: 0.9 10*3/uL (ref 0.2–0.9)
Monocytes Relative: 7 %
NEUTROS ABS: 10.6 10*3/uL — AB (ref 1.4–6.5)
Neutrophils Relative %: 83 %
PLATELETS: 313 10*3/uL (ref 150–440)
RBC: 2.89 MIL/uL — AB (ref 3.80–5.20)
RDW: 14.7 % — ABNORMAL HIGH (ref 11.5–14.5)
WBC: 12.8 10*3/uL — AB (ref 3.6–11.0)

## 2017-08-21 LAB — GLUCOSE, CAPILLARY
Glucose-Capillary: 97 mg/dL (ref 65–99)
Glucose-Capillary: 104 mg/dL — ABNORMAL HIGH (ref 65–99)
Glucose-Capillary: 167 mg/dL — ABNORMAL HIGH (ref 65–99)

## 2017-08-21 LAB — PROTIME-INR
INR: 1.14
Prothrombin Time: 14.5 seconds (ref 11.4–15.2)

## 2017-08-21 MED ORDER — GUAIFENESIN ER 600 MG PO TB12
600.0000 mg | ORAL_TABLET | Freq: Two times a day (BID) | ORAL | Status: DC
  Administered 2017-08-21: 600 mg via ORAL
  Filled 2017-08-21: qty 1

## 2017-08-21 MED ORDER — METHYLPREDNISOLONE SODIUM SUCC 125 MG IJ SOLR
60.0000 mg | Freq: Every day | INTRAMUSCULAR | Status: DC
  Administered 2017-08-21: 60 mg via INTRAVENOUS
  Filled 2017-08-21: qty 2

## 2017-08-21 MED ORDER — LEVOFLOXACIN 500 MG PO TABS: 500.0000 mg | ORAL_TABLET | Freq: Every day | ORAL | 0 refills | Status: DC

## 2017-08-21 MED ORDER — GUAIFENESIN ER 600 MG PO TB12: 600.0000 mg | ORAL_TABLET | Freq: Two times a day (BID) | ORAL | 0 refills | Status: DC

## 2017-08-21 MED ORDER — CYCLOBENZAPRINE HCL 10 MG PO TABS
5.0000 mg | ORAL_TABLET | Freq: Three times a day (TID) | ORAL | Status: DC | PRN
  Administered 2017-08-21: 5 mg via ORAL
  Filled 2017-08-21: qty 1

## 2017-08-21 NOTE — Care Management Obs Status (Signed)
[place] NOTIFICATION   Patient Details  Name: Sara Pierce MRN: [id number] Date of Birth: 12/20/1940   Medicare Observation Status Notification Given:  Yes    Riannah Stagner A, RN 08/21/2017, 1:00 PM

## 2017-08-21 NOTE — Progress Notes (Signed)
ANTICOAGULATION CONSULT NOTE - Follow Up Consult  Pharmacy Consult for Warfarin Indication: VTE prophylaxis  Allergies  Allergen Reactions  . Aspirin Swelling  . Codeine Itching  . Tape Hives    Patient Measurements: Height: 5\' 5"  (165.1 cm) Weight: 244 lb 12.8 oz (111 kg) IBW/kg (Calculated) : 57  Vital Signs: Temp: 98.1 F (36.7 C) (09/09 0651) Temp Source: Oral (09/09 0651) BP: 111/61 (09/09 0651) Pulse Rate: 68 (09/09 0651)  Labs:  Recent Labs  08/19/17 0342 08/19/17 2140 08/20/17 0432 08/21/17 0438  HGB 9.8* 10.4* 9.4*  --   HCT 28.4* 29.9* 27.5*  --   PLT 296 364 294  --   LABPROT  --   --  14.4 14.5  INR  --   --  1.13 1.14  CREATININE 0.90 1.01* 0.97  --   CKTOTAL 51  --   --   --   TROPONINI  --  <0.03  --   --     Estimated Creatinine Clearance: 63.1 mL/min (by C-G formula based on SCr of 0.97 mg/dL).   Medical History: Past Medical History:  Diagnosis Date  . Arthritis   . Cardiomyopathy ([place])   . Cataract   . Cerebral hemorrhage ([place])   . Chronic kidney disease   . Clotting disorder ([place])   . COPD (chronic obstructive pulmonary disease) ([place])   . Depression   . Diabetes mellitus without complication ([place])   . Hypertension   . Mixed incontinence   . Obesity   . Sleep apnea   . Thyroid disease   . Urinary frequency   . Vaginal atrophy   . Varicose veins     Medications:  Scheduled:  . FLUoxetine  40 mg Oral Daily  . furosemide  40 mg Oral BID  . insulin aspart  0-5 Units Subcutaneous QHS  . insulin aspart  0-9 Units Subcutaneous TID WC  . liothyronine  25 mcg Oral Daily  . montelukast  10 mg Oral QHS  . predniSONE  10 mg Oral Q breakfast  . primidone  50 mg Oral BID  . senna-docusate  2 tablet Oral Daily  . simvastatin  40 mg Oral QHS  . sodium chloride flush  3 mL Intravenous Q12H  . spironolactone  25 mg Oral Daily  . warfarin  5 mg Oral q1800  . Warfarin - Pharmacist Dosing Inpatient   Does not apply q1800   Infusions:   . levofloxacin (LEVAQUIN) IV Stopped (08/20/17 1938)    Assessment: 76 y.o. female, postop day 6 from a supracondylar fracture repair, ordered warfarin for VTE prophylaxis. Patient is on levofloxacin 750mg  IV daily.   9/8  INR  1.13   Warfarin 5mg  9/9  INR  1.14  Goal of Therapy:  INR 2-3 Monitor platelets by anticoagulation protocol: Yes   Plan:  Continue Warfarin 5mg  PO daily at 18:00. PT/INR with am labs.   Olivia Canter, RPH 08/21/2017,8:07 AM

## 2017-08-21 NOTE — Progress Notes (Addendum)
Pt developed productive cough with nebs given, solumedrol IV, mucinex given. Reports continued back pain in right and then mid back with tylenol given with staffing done with MD. Atarax given for anxiety with pt very anxious with partial relief.. Pt refused breakfast, then wanted to get up in chair. Pt reported feeling weak after she got up in chair; orange juice and banana given with atarax with all symptoms resolved. Educated pt on importance of eating regularly and snacks.  TC report done with Amy at [facility] with pt accepted. Rx for mucinex in discharge packet with AVS instructions.

## 2017-08-21 NOTE — Plan of Care (Signed)
Problem: Education: Goal: Knowledge of [facility] Education information/materials will improve Outcome: Progressing VSS, free of falls during shift.  Reported back pain 8-10/10, received PRN PO Tramadol 50mg  x1, PRN PO Tylenol 650mg  x1, PRN Oxy 5mg  x1.  Reported anxiety, received PRN PO Atarax 25mg  x1.  Reported gas pain, Dr. Jannifer Franklin paged, received ordered PRN Simethicone 80mg  x1.  No other complaints overnight.  Sat up on side of bed x2 assist, increased WOB.  Bed in low position, call bell within reach.  WCTM.

## 2017-08-21 NOTE — Progress Notes (Signed)
cpap refused 

## 2017-08-21 NOTE — Progress Notes (Addendum)
[place]  EMS here for transport with report given. Pt resting quietly in bed with less productive cough. Oxycodone given for pain prior to transport and for pain relief with pain reduced. Discharge packet given to EMS with rx for mucinex. Discharge to [facility].

## 2017-08-21 NOTE — Care Management CC44 (Signed)
Condition Code 44 Documentation Completed  Patient Details  Name: MALENI SEYER MRN: [id number] Date of Birth: 05/05/1941   Condition Code 44 given:  Yes Patient signature on Condition Code 44 notice:  Yes Documentation of 2 MD's agreement:  Yes Code 44 added to claim:  Yes    Rony Ratz A, RN 08/21/2017, 1:00 PM

## 2017-08-21 NOTE — Discharge Summary (Signed)
[employer] [employer] - [place] at [facility]   PATIENT NAME: Sara Pierce    MR#:  [id number]  DATE OF BIRTH:  02/09/1941  DATE OF ADMISSION:  08/19/2017 ADMITTING PHYSICIAN: Oralia Manis, MD  DATE OF DISCHARGE: 08/21/2017  PRIMARY CARE PHYSICIAN: Precious Reel, NP   ADMISSION DIAGNOSIS:  Acute cystitis without hematuria [N30.00] HCAP (healthcare-associated pneumonia) [J18.9] Pain of left upper extremity [M79.602]  DISCHARGE DIAGNOSIS:  Principal Problem:   Sepsis (HCC) Active Problems:   Blood clotting disorder (HCC)   Diabetes (HCC)   BP (high blood pressure)   HCAP (healthcare-associated pneumonia)  SECONDARY DIAGNOSIS:   Past Medical History:  Diagnosis Date  . Arthritis   . Cardiomyopathy (HCC)   . Cataract   . Cerebral hemorrhage (HCC)   . Chronic kidney disease   . Clotting disorder (HCC)   . COPD (chronic obstructive pulmonary disease) (HCC)   . Depression   . Diabetes mellitus without complication (HCC)   . Hypertension   . Mixed incontinence   . Obesity   . Sleep apnea   . Thyroid disease   . Urinary frequency   . Vaginal atrophy   . Varicose veins    ADMITTING HISTORY  HISTORY OF PRESENT ILLNESS:  Sara Pierce  is a 76 y.o. female who presents with cough, shortness of breath, significant pain. Patient recently had an operation for fracture repair. Here in the ED tonight she was found to have significantly elevated white blood cell count, with a strong suspicion for pneumonia. She met sepsis criteria initially, hospitalist called for admission   HOSPITAL COURSE:   Patient was admitted to medical floor.  * Acute bronchitis * UTI * Chronic upper and lower back pain * Chronic respiratory failure * Hypothyroidism * Diabetes mellitus * Hypertension  Patient was treated with IV steroids, nebulizers and oxygen support. Chest x-ray did not show any pneumonia. Antibiotics were the escalated to Levaquin. Urine culture is not sent from emergency  room. We'll treat empirically for acute bronchitis and UTI with Levaquin. Flutter valve.  For her back pain patient's recent CT scans and x-ray results were reviewed from here and at [facility]. She does have a left condylar process fracture, chronic L1 L3 L4 compression deformities. No rib fractures. Patient's Percocet is being continued. Add Flexeril.  Discussed with Dr. Zoila Shutter with [facility] program. Patient will be discharged back to [facility].  CONSULTS OBTAINED:    DRUG ALLERGIES:   Allergies  Allergen Reactions  . Aspirin Swelling  . Codeine Itching  . Tape Hives    DISCHARGE MEDICATIONS:   Current Discharge Medication List    START taking these medications   Details  guaiFENesin (MUCINEX) 600 MG 12 hr tablet Take 1 tablet (600 mg total) by mouth 2 (two) times daily. Qty: 10 tablet, Refills: 0      CONTINUE these medications which have CHANGED   Details  levofloxacin (LEVAQUIN) 500 MG tablet Take 1 tablet (500 mg total) by mouth daily. Qty: 5 tablet, Refills: 0      CONTINUE these medications which have NOT CHANGED   Details  acetaminophen (TYLENOL) 650 MG CR tablet Take 650 mg by mouth every 8 (eight) hours as needed for pain.    albuterol (PROVENTIL HFA;VENTOLIN HFA) 108 (90 Base) MCG/ACT inhaler Inhale 2 puffs into the lungs every 6 (six) hours as needed for wheezing or shortness of breath. Qty: 1 Inhaler, Refills: 2    albuterol (PROVENTIL) (2.5 MG/3ML) 0.083% nebulizer solution Take 2.5 mg by  nebulization every 4 (four) hours as needed for wheezing or shortness of breath.    alum & mag hydroxide-simeth (MAALOX/MYLANTA) 200-200-20 MG/5ML suspension Take 15 mLs by mouth every 6 (six) hours as needed for indigestion or heartburn.    buPROPion (WELLBUTRIN XL) 150 MG 24 hr tablet Take 150 mg by mouth daily.    FLUoxetine (PROZAC) 40 MG capsule Take 40 mg by mouth daily.    fluticasone (FLONASE) 50 MCG/ACT nasal spray Place 1 spray into both nostrils daily.     furosemide (LASIX) 40 MG tablet Take 40 mg by mouth 2 (two) times daily.    guaiFENesin-dextromethorphan (ROBITUSSIN DM) 100-10 MG/5ML syrup Take 10 mLs by mouth every 6 (six) hours as needed for cough. Qty: 118 mL, Refills: 0    hydrOXYzine (ATARAX/VISTARIL) 25 MG tablet Take 25 mg by mouth 2 (two) times daily as needed for anxiety.    ipratropium-albuterol (DUONEB) 0.5-2.5 (3) MG/3ML SOLN Inhale 3 mLs into the lungs 4 (four) times daily as needed (wheezing).     liothyronine (CYTOMEL) 25 MCG tablet Take 25 mcg by mouth daily.     magnesium oxide (MAG-OX) 400 MG tablet Take 400 mg by mouth daily.     montelukast (SINGULAIR) 10 MG tablet Take 10 mg by mouth at bedtime.     oxyCODONE (OXY IR/ROXICODONE) 5 MG immediate release tablet Take 5 mg by mouth every 6 (six) hours as needed for moderate pain, severe pain or breakthrough pain.    potassium chloride (K-DUR) 10 MEQ tablet Take 10 mEq by mouth daily.     predniSONE (DELTASONE) 10 MG tablet Take 10 mg by mouth daily with breakfast.    primidone (MYSOLINE) 50 MG tablet Take 50 mg by mouth 2 (two) times daily.     senna-docusate (SENOKOT-S) 8.6-50 MG tablet Take 2 tablets by mouth daily.    simvastatin (ZOCOR) 40 MG tablet Take 40 mg by mouth at bedtime.     spironolactone (ALDACTONE) 25 MG tablet Take 1 tablet (25 mg total) by mouth daily. Qty: 30 tablet, Refills: 1    traMADol (ULTRAM) 50 MG tablet Take 50 mg by mouth every 6 (six) hours as needed for moderate pain.    umeclidinium-vilanterol (ANORO ELLIPTA) 62.5-25 MCG/INH AEPB Inhale 1 puff into the lungs daily.    docusate sodium (COLACE) 100 MG capsule Take 1 capsule (100 mg total) by mouth 2 (two) times daily as needed for mild constipation. Qty: 10 capsule, Refills: 0    sodium chloride (OCEAN) 0.65 % SOLN nasal spray Place 2 sprays into both nostrils 3 (three) times daily as needed for congestion. Qty: 1 Bottle, Refills: 0      STOP taking these medications      oxyCODONE-acetaminophen (ROXICET) 5-325 MG tablet      torsemide (DEMADEX) 20 MG tablet         Today   VITAL SIGNS:  Blood pressure (!) 149/97, pulse 76, temperature 98 F (36.7 C), temperature source Oral, resp. rate 20, height '5\' 5"'$  (1.651 m), weight 111 kg (244 lb 12.8 oz), SpO2 95 %.  I/O:   Intake/Output Summary (Last 24 hours) at 08/21/17 1137 Last data filed at 08/21/17 0800  Gross per 24 hour  Intake              153 ml  Output              200 ml  Net              -  47 ml    PHYSICAL EXAMINATION:  Physical Exam  GENERAL:  76 y.o.-year-old patient lying in the bed. Obese LUNGS: Normal breath sounds bilaterally, no wheezing, rales,rhonchi or crepitation. No use of accessory muscles of respiration.  CARDIOVASCULAR: S1, S2 normal. No murmurs, rubs, or gallops.  ABDOMEN: Soft, non-tender, non-distended. Bowel sounds present. No organomegaly or mass.  NEUROLOGIC: Moves all 4 extremities. PSYCHIATRIC: The patient is alert and oriented x 3.  SKIN: No obvious rash, lesion, or ulcer.  Upper back tenderness Left arm cast  DATA REVIEW:   CBC  Recent Labs Lab 08/21/17 0453  WBC 12.8*  HGB 9.3*  HCT 27.1*  PLT 313    Chemistries   Recent Labs Lab 08/19/17 0342  08/20/17 0432  NA 134*  < > 132*  K 4.1  < > 3.7  CL 93*  < > 93*  CO2 30  < > 30  GLUCOSE 122*  < > 101*  BUN 22*  < > 24*  CREATININE 0.90  < > 0.97  CALCIUM 9.0  < > 8.8*  AST 22  --   --   ALT 16  --   --   ALKPHOS 74  --   --   BILITOT 0.7  --   --   < > = values in this interval not displayed.  Cardiac Enzymes  Recent Labs Lab 08/19/17 2140  TROPONINI <0.03    Microbiology Results  Results for orders placed or performed during the hospital encounter of 08/19/17  MRSA PCR Screening     Status: None   Collection Time: 08/20/17  2:06 AM  Result Value Ref Range Status   MRSA by PCR NEGATIVE NEGATIVE Final    Comment:        The GeneXpert MRSA Assay (FDA approved for NASAL  specimens only), is one component of a comprehensive MRSA colonization surveillance program. It is not intended to diagnose MRSA infection nor to guide or monitor treatment for MRSA infections.     RADIOLOGY:  Dg Chest Portable 1 View  Result Date: 08/19/2017 CLINICAL DATA:  Productive cough. EXAM: PORTABLE CHEST 1 VIEW COMPARISON:  Radiograph and CT 08/12/2017 FINDINGS: Unchanged heart size and mediastinal contours with stable cardiomegaly atherosclerosis. No pulmonary edema, confluent consolidation, large pleural effusion or pneumothorax. Remote left proximal humerus fracture and right humeral prosthesis partially included. IMPRESSION: Stable cardiomegaly without acute abnormality. Electronically Signed   By: Jeb Levering M.D.   On: 08/19/2017 22:19    Follow up with PCP in 1 week.  Management plans discussed with the patient, family and they are in agreement.  CODE STATUS:     Code Status Orders        Start     Ordered   08/20/17 0135  Full code  Continuous     08/20/17 0134    Code Status History    Date Active Date Inactive Code Status Order ID Comments User Context   10/03/2016  5:40 AM 10/04/2016  5:11 PM Full Code [id number]  Harrie Foreman, MD Inpatient   11/15/2015 10:23 PM 11/19/2015  3:14 PM Full Code [id number]  Hower, Aaron Mose, MD ED      TOTAL TIME TAKING CARE OF THIS PATIENT ON DAY OF DISCHARGE: more than 30 minutes.   Hillary Bow R M.D on 08/21/2017 at 11:37 AM  Between 7am to 6pm - Pager - [phone]  After 6pm go to [email or web address] - password [email or web address] [place] [facility]  Office  [phone]  CC: Primary care physician; Abelardo Diesel, NP  Note: This dictation was prepared with Dragon dictation along with smaller phrase technology. Any transcriptional errors that result from this process are unintentional.

## 2017-08-31 ENCOUNTER — Emergency Department
Admission: EM | Admit: 2017-08-31 | Discharge: 2017-08-31 | Disposition: A | Discharge status: Skilled Nursing Facility | Payer: Medicare (Managed Care) | Attending: Emergency Medicine | Admitting: Emergency Medicine

## 2017-08-31 ENCOUNTER — Encounter: Payer: Self-pay | Admitting: Emergency Medicine

## 2017-08-31 DIAGNOSIS — J449 Chronic obstructive pulmonary disease, unspecified: Secondary | ICD-10-CM | POA: Insufficient documentation

## 2017-08-31 DIAGNOSIS — I129 Hypertensive chronic kidney disease with stage 1 through stage 4 chronic kidney disease, or unspecified chronic kidney disease: Secondary | ICD-10-CM | POA: Diagnosis not present

## 2017-08-31 DIAGNOSIS — R350 Frequency of micturition: Secondary | ICD-10-CM | POA: Diagnosis present

## 2017-08-31 DIAGNOSIS — N189 Chronic kidney disease, unspecified: Secondary | ICD-10-CM | POA: Insufficient documentation

## 2017-08-31 DIAGNOSIS — M549 Dorsalgia, unspecified: Secondary | ICD-10-CM | POA: Diagnosis not present

## 2017-08-31 DIAGNOSIS — E079 Disorder of thyroid, unspecified: Secondary | ICD-10-CM | POA: Diagnosis not present

## 2017-08-31 DIAGNOSIS — Z87891 Personal history of nicotine dependence: Secondary | ICD-10-CM | POA: Diagnosis not present

## 2017-08-31 DIAGNOSIS — R109 Unspecified abdominal pain: Secondary | ICD-10-CM | POA: Diagnosis not present

## 2017-08-31 DIAGNOSIS — E119 Type 2 diabetes mellitus without complications: Secondary | ICD-10-CM | POA: Diagnosis not present

## 2017-08-31 DIAGNOSIS — N3000 Acute cystitis without hematuria: Secondary | ICD-10-CM | POA: Insufficient documentation

## 2017-08-31 DIAGNOSIS — Z79899 Other long term (current) drug therapy: Secondary | ICD-10-CM | POA: Insufficient documentation

## 2017-08-31 LAB — URINALYSIS, COMPLETE (UACMP) WITH MICROSCOPIC
Bilirubin Urine: NEGATIVE
Glucose, UA: NEGATIVE mg/dL
Hgb urine dipstick: NEGATIVE
KETONES UR: NEGATIVE mg/dL
Nitrite: NEGATIVE
PH: 7 (ref 5.0–8.0)
Protein, ur: 100 mg/dL — AB
SPECIFIC GRAVITY, URINE: 1.018 (ref 1.005–1.030)

## 2017-08-31 LAB — COMPREHENSIVE METABOLIC PANEL
ALBUMIN: 3.8 g/dL (ref 3.5–5.0)
ALT: 12 U/L — ABNORMAL LOW (ref 14–54)
AST: 22 U/L (ref 15–41)
Alkaline Phosphatase: 109 U/L (ref 38–126)
Anion gap: 9 (ref 5–15)
BILIRUBIN TOTAL: 0.9 mg/dL (ref 0.3–1.2)
BUN: 16 mg/dL (ref 6–20)
CO2: 30 mmol/L (ref 22–32)
Calcium: 9 mg/dL (ref 8.9–10.3)
Chloride: 96 mmol/L — ABNORMAL LOW (ref 101–111)
Creatinine, Ser: 0.83 mg/dL (ref 0.44–1.00)
GFR calc Af Amer: >60 mL/min (ref >60)
GFR calc non Af Amer: >60 mL/min (ref >60)
Glucose, Bld: 104 mg/dL — ABNORMAL HIGH (ref 65–99)
POTASSIUM: 3.7 mmol/L (ref 3.5–5.1)
Sodium: 135 mmol/L (ref 135–145)
Total Protein: 6.4 g/dL — ABNORMAL LOW (ref 6.5–8.1)

## 2017-08-31 LAB — CBC WITH DIFFERENTIAL/PLATELET
Basophils Absolute: 0.1 10*3/uL (ref 0–0.1)
Basophils Relative: 1 %
Eosinophils Absolute: 0 10*3/uL (ref 0–0.7)
Eosinophils Relative: 1 %
HEMATOCRIT: 30.4 % — AB (ref 35.0–47.0)
HEMOGLOBIN: 10.4 g/dL — AB (ref 12.0–16.0)
LYMPHS ABS: 1.2 10*3/uL (ref 1.0–3.6)
Lymphocytes Relative: 14 %
MCH: 31.9 pg (ref 26.0–34.0)
MCHC: 34.2 g/dL (ref 32.0–36.0)
MCV: 93.2 fL (ref 80.0–100.0)
MONOS PCT: 10 %
Monocytes Absolute: 0.8 10*3/uL (ref 0.2–0.9)
NEUTROS ABS: 6.3 10*3/uL (ref 1.4–6.5)
NEUTROS PCT: 74 %
Platelets: 309 10*3/uL (ref 150–440)
RBC: 3.26 MIL/uL — ABNORMAL LOW (ref 3.80–5.20)
RDW: 16.5 % — ABNORMAL HIGH (ref 11.5–14.5)
WBC: 8.4 10*3/uL (ref 3.6–11.0)

## 2017-08-31 MED ORDER — OXYCODONE-ACETAMINOPHEN 5-325 MG PO TABS
1.0000 | ORAL_TABLET | Freq: Once | ORAL | Status: AC
  Administered 2017-08-31: 1 via ORAL
  Filled 2017-08-31: qty 1

## 2017-08-31 MED ORDER — SULFAMETHOXAZOLE-TRIMETHOPRIM 800-160 MG PO TABS: 1.0000 | ORAL_TABLET | Freq: Two times a day (BID) | ORAL | 0 refills | Status: DC

## 2017-08-31 MED ORDER — CEFTRIAXONE SODIUM IN DEXTROSE 20 MG/ML IV SOLN
1.0000 g | Freq: Once | INTRAVENOUS | Status: AC
  Administered 2017-08-31: 1 g via INTRAVENOUS
  Filled 2017-08-31: qty 50

## 2017-08-31 MED ORDER — SODIUM CHLORIDE 0.9 % IV BOLUS (SEPSIS)
1000.0000 mL | Freq: Once | INTRAVENOUS | Status: AC
Start: 2017-08-31 — End: 2017-08-31
  Administered 2017-08-31: 1000 mL via INTRAVENOUS

## 2017-08-31 NOTE — ED Triage Notes (Signed)
Pt arrived via ems from [facility] with complaints of burning while urinating. Upon arrival pt a & o x 4.

## 2017-08-31 NOTE — ED Provider Notes (Signed)
[facility] Emergency Department Provider Note    First MD Initiated Contact with Patient 08/31/17 0308     (approximate)  I have reviewed the triage vital signs and the nursing notes.   HISTORY  Chief Complaint Urinary Frequency; Abdominal Pain; and Back Pain   HPI Sara Pierce is a 76 y.o. female with below list of chronic medical conditions presents to the emergency department via EMS with history of dysuria and right upper back discomfort 3 days. Patient denies any fever or febrile on presentation with attempt to 98.3. Patient denies any abdominal pain no nausea or vomiting.   Past Medical History:  Diagnosis Date  . Arthritis   . Cardiomyopathy ([place])   . Cataract   . Cerebral hemorrhage ([place])   . Chronic kidney disease   . Clotting disorder ([place])   . COPD (chronic obstructive pulmonary disease) ([place])   . Depression   . Diabetes mellitus without complication ([place])   . Hypertension   . Mixed incontinence   . Obesity   . Sleep apnea   . Thyroid disease   . Urinary frequency   . Vaginal atrophy   . Varicose veins     Patient Active Problem List   Diagnosis Date Noted  . HCAP (healthcare-associated pneumonia) 08/19/2017  . CAP (community acquired pneumonia) 10/03/2016  . Sepsis ([place]) 11/15/2015  . Absolute anemia 03/15/2015  . Anxiety 03/15/2015  . Arthritis 03/15/2015  . Cardiomyopathy, secondary ([place]) 03/15/2015  . Cataract 03/15/2015  . Brain bleed ([place]) 03/15/2015  . Chronic constipation 03/15/2015  . Chronic kidney failure 03/15/2015  . Blood clotting disorder ([place]) 03/15/2015  . CAFL (chronic airflow limitation) ([place]) 03/15/2015  . Clinical depression 03/15/2015  . Diabetes ([place]) 03/15/2015  . H/O varicella 03/15/2015  . BP (high blood pressure) 03/15/2015  . Mixed incontinence 03/15/2015  . 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 Adiposity 03/15/2015  . Apnea, sleep 03/15/2015  . Disease of thyroid gland  03/15/2015  . FOM (frequency of micturition) 03/15/2015  . 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 Candida vaginitis 03/15/2015  . Abnormal ECG 03/15/2015    Past Surgical History:  Procedure Laterality Date  . APPENDECTOMY    . Cardiac Catherization    . COLONOSCOPY WITH PROPOFOL N/A 04/28/2016   Procedure: COLONOSCOPY WITH PROPOFOL;  Surgeon: Manya Silvas, MD;  Location: [facility] ENDOSCOPY;  Service: Endoscopy;  Laterality: N/A;  . COLONOSCOPY WITH PROPOFOL N/A 04/29/2016   Procedure: COLONOSCOPY WITH PROPOFOL;  Surgeon: Manya Silvas, MD;  Location: [facility] ENDOSCOPY;  Service: Endoscopy;  Laterality: N/A;  . JOINT REPLACEMENT    . STRABISMUS SURGERY    . TONSILLECTOMY    . TUBAL LIGATION      Prior to Admission medications   Medication Sig Start Date End Date Taking? Authorizing Provider  acetaminophen (TYLENOL) 650 MG CR tablet Take 650 mg by mouth every 8 (eight) hours as needed for pain.    [provider]  albuterol (PROVENTIL HFA;VENTOLIN HFA) 108 (90 Base) MCG/ACT inhaler Inhale 2 puffs into the lungs every 6 (six) hours as needed for wheezing or shortness of breath. 06/15/17   Rudene Re, MD  albuterol (PROVENTIL) (2.5 MG/3ML) 0.083% nebulizer solution Take 2.5 mg by nebulization every 4 (four) hours as needed for wheezing or shortness of breath.    [provider]  alum & mag hydroxide-simeth (MAALOX/MYLANTA) 200-200-20 MG/5ML suspension Take 15 mLs by mouth every 6 (six) hours as needed  for indigestion or heartburn.    [provider]  buPROPion (WELLBUTRIN XL) 150 MG 24 hr tablet Take 150 mg by mouth daily.    [provider]  docusate sodium (COLACE) 100 MG capsule Take 1 capsule (100 mg total) by mouth 2 (two) times daily as needed for mild constipation. Patient not taking: Reported on 08/19/2017 10/04/16   Nicholes Mango, MD  FLUoxetine (PROZAC) 40 MG capsule Take 40 mg by mouth daily.    [provider]    fluticasone (FLONASE) 50 MCG/ACT nasal spray Place 1 spray into both nostrils daily.    [provider]  furosemide (LASIX) 40 MG tablet Take 40 mg by mouth 2 (two) times daily.    [provider]  guaiFENesin (MUCINEX) 600 MG 12 hr tablet Take 1 tablet (600 mg total) by mouth 2 (two) times daily. 08/21/17   Hillary Bow, MD  guaiFENesin-dextromethorphan (ROBITUSSIN DM) 100-10 MG/5ML syrup Take 10 mLs by mouth every 6 (six) hours as needed for cough. 10/04/16   Nicholes Mango, MD  hydrOXYzine (ATARAX/VISTARIL) 25 MG tablet Take 25 mg by mouth 2 (two) times daily as needed for anxiety.    [provider]  ipratropium-albuterol (DUONEB) 0.5-2.5 (3) MG/3ML SOLN Inhale 3 mLs into the lungs 4 (four) times daily as needed (wheezing).     [provider]  levofloxacin (LEVAQUIN) 500 MG tablet Take 1 tablet (500 mg total) by mouth daily. 08/21/17   Hillary Bow, MD  liothyronine (CYTOMEL) 25 MCG tablet Take 25 mcg by mouth daily.     [provider]  magnesium oxide (MAG-OX) 400 MG tablet Take 400 mg by mouth daily.     [provider]  montelukast (SINGULAIR) 10 MG tablet Take 10 mg by mouth at bedtime.     [provider]  oxyCODONE (OXY IR/ROXICODONE) 5 MG immediate release tablet Take 5 mg by mouth every 6 (six) hours as needed for moderate pain, severe pain or breakthrough pain.    [provider]  potassium chloride (K-DUR) 10 MEQ tablet Take 10 mEq by mouth daily.     [provider]  predniSONE (DELTASONE) 10 MG tablet Take 10 mg by mouth daily with breakfast.    [provider]  primidone (MYSOLINE) 50 MG tablet Take 50 mg by mouth 2 (two) times daily.     [provider]  senna-docusate (SENOKOT-S) 8.6-50 MG tablet Take 2 tablets by mouth daily.    [provider]  simvastatin (ZOCOR) 40 MG tablet Take 40 mg by mouth at bedtime.     [provider]  sodium chloride (OCEAN) 0.65 %  SOLN nasal spray Place 2 sprays into both nostrils 3 (three) times daily as needed for congestion. Patient not taking: Reported on 08/19/2017 10/04/16   Nicholes Mango, MD  spironolactone (ALDACTONE) 25 MG tablet Take 1 tablet (25 mg total) by mouth daily. 11/19/15   Gladstone Lighter, MD  traMADol (ULTRAM) 50 MG tablet Take 50 mg by mouth every 6 (six) hours as needed for moderate pain.    [provider]  umeclidinium-vilanterol (ANORO ELLIPTA) 62.5-25 MCG/INH AEPB Inhale 1 puff into the lungs daily.    [provider]    Allergies Aspirin; Codeine; and Tape  Family History  Problem Relation Age of Onset  . Heart attack Father   . Coronary artery disease Father   . Hypertension Father   . Hyperlipidemia Father   . Heart attack Mother   . Hypertension Mother   .  Hyperlipidemia Mother   . Breast cancer Neg Hx     Social History Social History  Substance Use Topics  . Smoking status: Former Smoker    Packs/day: 0.50    Years: 44.00    Types: Cigarettes  . Smokeless tobacco: Never Used  . Alcohol use No    Review of Systems Constitutional: No fever/chills Eyes: No visual changes. ENT: No sore throat. Cardiovascular: Denies chest pain. Respiratory: Denies shortness of breath. Gastrointestinal: No abdominal pain.  No nausea, no vomiting.  No diarrhea.  No constipation. Genitourinary: Positive for dysuria. Musculoskeletal: Negative for neck pain.  Positive for back pain. Integumentary: Negative for rash. Neurological: Negative for headaches, focal weakness or numbness.   ____________________________________________   PHYSICAL EXAM:  VITAL SIGNS: ED Triage Vitals  Enc Vitals Group     BP 08/31/17 0309 104/60     Pulse Rate 08/31/17 0309 82     Resp 08/31/17 0309 20     Temp 08/31/17 0309 98.3 F (36.8 C)     Temp Source 08/31/17 0309 Oral     SpO2 08/31/17 0309 100 %     Weight 08/31/17 0310 110.7 kg (244 lb)     Height 08/31/17 0310 1.651 m (5'  5")     Head Circumference --      Peak Flow --      Pain Score 08/31/17 0309 10     Pain Loc --      Pain Edu? --      Excl. in [place]? --     Constitutional: Alert and oriented. Well appearing and in no acute distress. Eyes: Conjunctivae are normal. Head: Atraumatic. Mouth/Throat: Mucous membranes are moist.  Oropharynx non-erythematous. Neck: No stridor.   Cardiovascular: Normal rate, regular rhythm. Good peripheral circulation. Grossly normal heart sounds. Respiratory: Normal respiratory effort.  No retractions. Lungs CTAB. Gastrointestinal: Soft and nontender. No distention.  Musculoskeletal: No lower extremity tenderness nor edema. No gross deformities of extremities. Neurologic:  Normal speech and language. No gross focal neurologic deficits are appreciated.  Skin:  Skin is warm, dry and intact. No rash noted. Psychiatric: Mood and affect are normal. Speech and behavior are normal.  ____________________________________________   LABS (all labs ordered are listed, but only abnormal results are displayed)  Labs Reviewed  URINALYSIS, COMPLETE (UACMP) WITH MICROSCOPIC - Abnormal; Notable for the following:       Result Value   Color, Urine YELLOW (*)    APPearance TURBID (*)    Protein, ur 100 (*)    Leukocytes, UA LARGE (*)    Bacteria, UA RARE (*)    Squamous Epithelial / LPF 0-5 (*)    All other components within normal limits  COMPREHENSIVE METABOLIC PANEL - Abnormal; Notable for the following:    Chloride 96 (*)    Glucose, Bld 104 (*)    Total Protein 6.4 (*)    ALT 12 (*)    All other components within normal limits  CBC WITH DIFFERENTIAL/PLATELET - Abnormal; Notable for the following:    RBC 3.26 (*)    Hemoglobin 10.4 (*)    HCT 30.4 (*)    RDW 16.5 (*)    All other components within normal limits  URINE CULTURE    Procedures   ____________________________________________   INITIAL IMPRESSION / ASSESSMENT AND PLAN / ED COURSE  Pertinent labs &  imaging results that were available during my care of the patient were reviewed by me and considered in my medical decision making (see  chart for details).  76 year old female presenting with above stated history of physical exam concern for acute cystitis versus pyelonephritis urinalysis consistent with UTI. Patient given 1 g by the ceftriaxone emergency department will be prescribed Bactrim DS for home.      ____________________________________________  FINAL CLINICAL IMPRESSION(S) / ED DIAGNOSES  Final diagnoses:  Acute cystitis without hematuria     MEDICATIONS GIVEN DURING THIS VISIT:  Medications  cefTRIAXone (ROCEPHIN) 1 g in dextrose 5 % 50 mL IVPB - Premix (not administered)  sodium chloride 0.9 % bolus 1,000 mL (1,000 mLs Intravenous New Bag/Given 08/31/17 0334)  oxyCODONE-acetaminophen (PERCOCET/ROXICET) 5-325 MG per tablet 1 tablet (1 tablet Oral Given 08/31/17 0335)     NEW OUTPATIENT MEDICATIONS STARTED DURING THIS VISIT:  New Prescriptions   No medications on file    Modified Medications   No medications on file    Discontinued Medications   No medications on file     Note:  This document was prepared using Dragon voice recognition software and may include unintentional dictation errors.    Gregor Hams, MD 08/31/17 [phone]

## 2017-09-02 LAB — URINE CULTURE: Culture: >=100000 — AB

## 2017-09-03 NOTE — Progress Notes (Signed)
Culture Report  Sara Pierce is a 76 year old female who presented to the ED on 9/19 with chief complaint of urinary frequency, abdominal pain and back pain. Urine culture was taken. Discharged on Bactrim 800-160mg  PO twice daily for 10 days.   Urine Culture result (9/21) positive for proteus mirabilis:  resistant to Bactrim  susceptible to ampicillin.   Per Dr. Alfred Levins approval, will switch patient over to Augmentin 500mg  PO twice daily for 7 days.   Called patient twice today and went straight to VM. Left VM for patient to call main pharmacy back.   [place] Resident  09/03/17

## 2017-09-04 ENCOUNTER — Emergency Department
Admission: EM | Admit: 2017-09-04 | Discharge: 2017-09-04 | Disposition: A | Discharge status: Home / Self Care | Payer: Medicare (Managed Care) | Attending: Emergency Medicine | Admitting: Emergency Medicine

## 2017-09-04 ENCOUNTER — Emergency Department: Payer: Medicare (Managed Care)

## 2017-09-04 DIAGNOSIS — Z8679 Personal history of other diseases of the circulatory system: Secondary | ICD-10-CM | POA: Diagnosis not present

## 2017-09-04 DIAGNOSIS — N189 Chronic kidney disease, unspecified: Secondary | ICD-10-CM | POA: Insufficient documentation

## 2017-09-04 DIAGNOSIS — R202 Paresthesia of skin: Secondary | ICD-10-CM | POA: Diagnosis not present

## 2017-09-04 DIAGNOSIS — R1084 Generalized abdominal pain: Secondary | ICD-10-CM | POA: Diagnosis present

## 2017-09-04 DIAGNOSIS — J449 Chronic obstructive pulmonary disease, unspecified: Secondary | ICD-10-CM | POA: Insufficient documentation

## 2017-09-04 DIAGNOSIS — Q799 Congenital malformation of musculoskeletal system, unspecified: Secondary | ICD-10-CM | POA: Diagnosis not present

## 2017-09-04 DIAGNOSIS — I129 Hypertensive chronic kidney disease with stage 1 through stage 4 chronic kidney disease, or unspecified chronic kidney disease: Secondary | ICD-10-CM | POA: Diagnosis not present

## 2017-09-04 DIAGNOSIS — I2510 Atherosclerotic heart disease of native coronary artery without angina pectoris: Secondary | ICD-10-CM | POA: Insufficient documentation

## 2017-09-04 DIAGNOSIS — Z87891 Personal history of nicotine dependence: Secondary | ICD-10-CM | POA: Insufficient documentation

## 2017-09-04 DIAGNOSIS — Z79899 Other long term (current) drug therapy: Secondary | ICD-10-CM | POA: Diagnosis not present

## 2017-09-04 DIAGNOSIS — E1122 Type 2 diabetes mellitus with diabetic chronic kidney disease: Secondary | ICD-10-CM | POA: Insufficient documentation

## 2017-09-04 LAB — URINALYSIS, COMPLETE (UACMP) WITH MICROSCOPIC
BACTERIA UA: NONE SEEN
Bilirubin Urine: NEGATIVE
GLUCOSE, UA: NEGATIVE mg/dL
HGB URINE DIPSTICK: NEGATIVE
KETONES UR: NEGATIVE mg/dL
LEUKOCYTES UA: NEGATIVE
NITRITE: NEGATIVE
PH: 5 (ref 5.0–8.0)
Protein, ur: NEGATIVE mg/dL
Specific Gravity, Urine: 1.019 (ref 1.005–1.030)

## 2017-09-04 LAB — COMPREHENSIVE METABOLIC PANEL
ALK PHOS: 120 U/L (ref 38–126)
ALT: 13 U/L — ABNORMAL LOW (ref 14–54)
ANION GAP: 10 (ref 5–15)
AST: 17 U/L (ref 15–41)
Albumin: 3.9 g/dL (ref 3.5–5.0)
BILIRUBIN TOTAL: 0.4 mg/dL (ref 0.3–1.2)
BUN: 14 mg/dL (ref 6–20)
CALCIUM: 9.2 mg/dL (ref 8.9–10.3)
CO2: 29 mmol/L (ref 22–32)
Chloride: 97 mmol/L — ABNORMAL LOW (ref 101–111)
Creatinine, Ser: 0.81 mg/dL (ref 0.44–1.00)
GFR calc non Af Amer: >60 mL/min (ref >60)
Glucose, Bld: 111 mg/dL — ABNORMAL HIGH (ref 65–99)
POTASSIUM: 4.1 mmol/L (ref 3.5–5.1)
Sodium: 136 mmol/L (ref 135–145)
TOTAL PROTEIN: 6.6 g/dL (ref 6.5–8.1)

## 2017-09-04 LAB — CBC
HEMATOCRIT: 32 % — AB (ref 35.0–47.0)
HEMOGLOBIN: 11 g/dL — AB (ref 12.0–16.0)
MCH: 33.1 pg (ref 26.0–34.0)
MCHC: 34.3 g/dL (ref 32.0–36.0)
MCV: 96.5 fL (ref 80.0–100.0)
Platelets: 258 10*3/uL (ref 150–440)
RBC: 3.32 MIL/uL — AB (ref 3.80–5.20)
RDW: 16.9 % — ABNORMAL HIGH (ref 11.5–14.5)
WBC: 8 10*3/uL (ref 3.6–11.0)

## 2017-09-04 LAB — LACTIC ACID, PLASMA: Lactic Acid, Venous: 0.8 mmol/L (ref 0.5–1.9)

## 2017-09-04 LAB — LIPASE, BLOOD: Lipase: 22 U/L (ref 11–51)

## 2017-09-04 MED ORDER — ONDANSETRON HCL 4 MG/2ML IJ SOLN
4.0000 mg | INTRAMUSCULAR | Status: AC
  Administered 2017-09-04: 4 mg via INTRAVENOUS
  Filled 2017-09-04: qty 2

## 2017-09-04 MED ORDER — MORPHINE SULFATE (PF) 2 MG/ML IV SOLN
2.0000 mg | Freq: Once | INTRAVENOUS | Status: AC
  Administered 2017-09-04: 2 mg via INTRAVENOUS
  Filled 2017-09-04: qty 1

## 2017-09-04 MED ORDER — IOPAMIDOL (ISOVUE-300) INJECTION 61%
100.0000 mL | Freq: Once | INTRAVENOUS | Status: AC | PRN
  Administered 2017-09-04: 100 mL via INTRAVENOUS

## 2017-09-04 NOTE — ED Notes (Signed)
[place] to let nurse know pt is returning.

## 2017-09-04 NOTE — ED Provider Notes (Signed)
[facility] Emergency Department Provider Note  ____________________________________________   First MD Initiated Contact with Patient 09/04/17 [phone]     (approximate)  I have reviewed the triage vital signs and the nursing notes.   HISTORY  Chief Complaint Abdominal Pain  the patient is a vague historian and provides limited information and her history  HPI Sara Pierce is a 76 y.o. female with extensive past medical history who presents by EMS from [facility] with a variety of complaints.  She is a patient in the [facility].   initially she presented concerned for urinary tract infection and urinary retention, stating that she had not urinated in or than 12 hours.  She then reported that she was having abdominal pain.  Later during the course of her emergency department stay she reported that she was numb all over her whole body but mostly on the skin of her abdomen as well as her arms.  She then reported that she was having pain underneath her right breast and thought that she may have a rash that cause severe pain whenever she moved.  She reports that the pain is severe everywhere.  Nothing in particular makes the patient's symptoms better nor worse.    She denies nausea and vomiting and reports that she would like something to drink.  She denies fever/chills, chest pain, and shortness of breath.  It is unclear how long the symptoms have been present.   Past Medical History:  Diagnosis Date  . Arthritis   . Cardiomyopathy ([place])   . Cataract   . Cerebral hemorrhage ([place])   . Chronic kidney disease   . Clotting disorder ([place])   . COPD (chronic obstructive pulmonary disease) ([place])   . Depression   . Diabetes mellitus without complication ([place])   . Hypertension   . Mixed incontinence   . Obesity   . Sleep apnea   . Thyroid disease   . Urinary frequency   . Vaginal atrophy   . Varicose veins     Patient Active Problem List   Diagnosis Date  Noted  . HCAP (healthcare-associated pneumonia) 08/19/2017  . CAP (community acquired pneumonia) 10/03/2016  . Sepsis ([place]) 11/15/2015  . Absolute anemia 03/15/2015  . Anxiety 03/15/2015  . Arthritis 03/15/2015  . Cardiomyopathy, secondary ([place]) 03/15/2015  . Cataract 03/15/2015  . Brain bleed ([place]) 03/15/2015  . Chronic constipation 03/15/2015  . Chronic kidney failure 03/15/2015  . Blood clotting disorder ([place]) 03/15/2015  . CAFL (chronic airflow limitation) ([place]) 03/15/2015  . Clinical depression 03/15/2015  . Diabetes ([place]) 03/15/2015  . H/O varicella 03/15/2015  . BP (high blood pressure) 03/15/2015  . Mixed incontinence 03/15/2015  . 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 Adiposity 03/15/2015  . Apnea, sleep 03/15/2015  . Disease of thyroid gland 03/15/2015  . FOM (frequency of micturition) 03/15/2015  . 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 Candida vaginitis 03/15/2015  . Abnormal ECG 03/15/2015    Past Surgical History:  Procedure Laterality Date  . APPENDECTOMY    . Cardiac Catherization    . COLONOSCOPY WITH PROPOFOL N/A 04/28/2016   Procedure: COLONOSCOPY WITH PROPOFOL;  Surgeon: Manya Silvas, MD;  Location: [facility] ENDOSCOPY;  Service: Endoscopy;  Laterality: N/A;  . COLONOSCOPY WITH PROPOFOL N/A 04/29/2016   Procedure: COLONOSCOPY WITH PROPOFOL;  Surgeon: Manya Silvas, MD;  Location: [facility] ENDOSCOPY;  Service: Endoscopy;  Laterality: N/A;  . JOINT REPLACEMENT    .  STRABISMUS SURGERY    . TONSILLECTOMY    . TUBAL LIGATION      Prior to Admission medications   Medication Sig Start Date End Date Taking? Authorizing Provider  acetaminophen (TYLENOL) 650 MG CR tablet Take 650 mg by mouth every 6 (six) hours.    Yes [provider]  albuterol (PROVENTIL HFA;VENTOLIN HFA) 108 (90 Base) MCG/ACT inhaler Inhale 2 puffs into the lungs every 6 (six) hours as needed for wheezing or shortness of breath. Patient taking  differently: Inhale 2 puffs into the lungs every 4 (four) hours as needed for wheezing or shortness of breath.  06/15/17  Yes Alfred Levins, [place], MD  albuterol (PROVENTIL) (2.5 MG/3ML) 0.083% nebulizer solution Take 2.5 mg by nebulization every 4 (four) hours as needed for wheezing or shortness of breath.   Yes [provider]  alum & mag hydroxide-simeth (MAALOX/MYLANTA) 200-200-20 MG/5ML suspension Take 15 mLs by mouth every 6 (six) hours as needed for indigestion or heartburn.   Yes [provider]  buPROPion (WELLBUTRIN XL) 150 MG 24 hr tablet Take 150 mg by mouth daily.   Yes [provider]  cefTRIAXone (ROCEPHIN) 1 g injection Inject 1 g into the muscle daily. 09/03/17 09/04/17 Yes [provider]  cyclobenzaprine (FLEXERIL) 5 MG tablet Take 5 mg by mouth 2 (two) times daily as needed for muscle spasms.   Yes [provider]  diphenhydrAMINE (BENADRYL) 25 MG tablet Take 25 mg by mouth daily as needed (burning/ flushing).   Yes [provider]  docusate sodium (COLACE) 100 MG capsule Take 1 capsule (100 mg total) by mouth 2 (two) times daily as needed for mild constipation. 10/04/16  Yes Gouru, Illene Silver, MD  FLUoxetine (PROZAC) 40 MG capsule Take 40 mg by mouth daily.   Yes [provider]  fluticasone (FLONASE) 50 MCG/ACT nasal spray Place 2 sprays into both nostrils daily.    Yes [provider]  furosemide (LASIX) 40 MG tablet Take 40 mg by mouth daily.    Yes [provider]  guaiFENesin-dextromethorphan (ROBITUSSIN DM) 100-10 MG/5ML syrup Take 10 mLs by mouth every 6 (six) hours as needed for cough. Patient taking differently: Take 10 mLs by mouth every 4 (four) hours as needed for cough.  10/04/16  Yes Gouru, Illene Silver, MD  hydrOXYzine (ATARAX/VISTARIL) 25 MG tablet Take 25 mg by mouth 2 (two) times daily as needed for anxiety.   Yes [provider]  ipratropium-albuterol (DUONEB) 0.5-2.5 (3) MG/3ML SOLN Inhale 3  mLs into the lungs every 6 (six) hours as needed (wheezing).    Yes [provider]  liothyronine (CYTOMEL) 25 MCG tablet Take 25 mcg by mouth daily.    Yes [provider]  magnesium oxide (MAG-OX) 400 MG tablet Take 400 mg by mouth daily.    Yes [provider]  montelukast (SINGULAIR) 10 MG tablet Take 10 mg by mouth at bedtime.    Yes [provider]  oxyCODONE (OXY IR/ROXICODONE) 5 MG immediate release tablet Take 5 mg by mouth 3 (three) times daily.    Yes [provider]  polyethylene glycol (MIRALAX / GLYCOLAX) packet Take 17 g by mouth daily.   Yes [provider]  potassium chloride (K-DUR) 10 MEQ tablet Take 10 mEq by mouth daily.    Yes [provider]  predniSONE (DELTASONE) 10 MG tablet Take 10 mg by mouth daily with breakfast.   Yes [provider]  primidone (MYSOLINE) 50 MG tablet Take 50 mg by mouth  2 (two) times daily.    Yes [provider]  senna-docusate (SENOKOT-S) 8.6-50 MG tablet Take 2 tablets by mouth daily.   Yes [provider]  simvastatin (ZOCOR) 40 MG tablet Take 40 mg by mouth at bedtime.    Yes [provider]  sodium chloride (OCEAN) 0.65 % SOLN nasal spray Place 2 sprays into both nostrils 3 (three) times daily as needed for congestion. 10/04/16  Yes Gouru, Illene Silver, MD  spironolactone (ALDACTONE) 25 MG tablet Take 1 tablet (25 mg total) by mouth daily. 11/19/15  Yes Gladstone Lighter, MD  traMADol (ULTRAM) 50 MG tablet Take 50 mg by mouth every 6 (six) hours as needed for moderate pain.   Yes [provider]  umeclidinium-vilanterol (ANORO ELLIPTA) 62.5-25 MCG/INH AEPB Inhale 1 puff into the lungs daily.   Yes [provider]    Allergies Aspirin; Codeine; and Tape  Family History  Problem Relation Age of Onset  . Heart attack Father   . Coronary artery disease Father   . Hypertension Father   . Hyperlipidemia Father   . Heart attack Mother     . Hypertension Mother   . Hyperlipidemia Mother   . Breast cancer Neg Hx     Social History Social History  Substance Use Topics  . Smoking status: Former Smoker    Packs/day: 0.50    Years: 44.00    Types: Cigarettes  . Smokeless tobacco: Never Used  . Alcohol use No    Review of Systems Constitutional: No fever/chills Eyes: No visual changes. ENT: No sore throat. Cardiovascular: Denies chest pain. Respiratory: Denies shortness of breath. Gastrointestinal: +abdominal pain.  No nausea, no vomiting.  No diarrhea.  No constipation. Genitourinary: Negative for dysuria. +urinary retention. Musculoskeletal: Negative for neck pain.  Negative for back pain. Integumentary: Negative for rash. Neurological: numbness all over her skin, particularly over the abdomen.  No focal weakness.   ____________________________________________   PHYSICAL EXAM:  VITAL SIGNS: ED Triage Vitals  Enc Vitals Group     BP 09/04/17 0249 124/68     Pulse Rate 09/04/17 0249 80     Resp 09/04/17 0249 14     Temp 09/04/17 0255 99.6 F (37.6 C)     Temp Source 09/04/17 0255 Rectal     SpO2 09/04/17 0249 96 %     Weight 09/04/17 0251 106.6 kg (235 lb)     Height 09/04/17 0251 1.651 m (5\' 5" )     Head Circumference --      Peak Flow --      Pain Score 09/04/17 0249 10     Pain Loc --      Pain Edu? --      Excl. in [place]? --     Constitutional: Alert and oriented. appears chronically ill but in no acute distress. Eyes: Conjunctivae are normal.  Head: Atraumatic. Nose: No congestion/rhinnorhea. Mouth/Throat: Mucous membranes are moist. Neck: No stridor.  No meningeal signs.   Cardiovascular: Normal rate, regular rhythm. Good peripheral circulation. Grossly normal heart sounds. Respiratory: Normal respiratory effort.  No retractions. Lungs CTAB. Gastrointestinal: morbidly obese. Soft and nontender throughout with no focal upper nor lower quadrant tenderness. No distention.  Musculoskeletal: No  lower extremity tenderness nor edema. No gross deformities of extremities. Neurologic:  Normal speech and language. No gross focal neurologic deficits are appreciated.  Skin:  Skin is warm, dry and intact. No rash noted including under the right breast except for a small area of erythema consistent with  what is commonly seen in intertriginous regions but without satellite lesions or any evidence of candidiasis Psychiatric: Mood and affect are somewhat anxious.  ____________________________________________   LABS (all labs ordered are listed, but only abnormal results are displayed)  Labs Reviewed  COMPREHENSIVE METABOLIC PANEL - Abnormal; Notable for the following:       Result Value   Chloride 97 (*)    Glucose, Bld 111 (*)    ALT 13 (*)    All other components within normal limits  CBC - Abnormal; Notable for the following:    RBC 3.32 (*)    Hemoglobin 11.0 (*)    HCT 32.0 (*)    RDW 16.9 (*)    All other components within normal limits  URINALYSIS, COMPLETE (UACMP) WITH MICROSCOPIC - Abnormal; Notable for the following:    Color, Urine YELLOW (*)    APPearance CLEAR (*)    Squamous Epithelial / LPF 0-5 (*)    All other components within normal limits  LIPASE, BLOOD  LACTIC ACID, PLASMA   ____________________________________________  EKG  None - EKG not ordered by ED physician ____________________________________________  RADIOLOGY   Ct Abdomen Pelvis W Contrast  Result Date: 09/04/2017 CLINICAL DATA:  Abdominal pain. Recent diagnosis of urinary tract infection. Unable to urinate since 6 o'clock this morning. EXAM: CT ABDOMEN AND PELVIS WITH CONTRAST TECHNIQUE: Multidetector CT imaging of the abdomen and pelvis was performed using the standard protocol following bolus administration of intravenous contrast. CONTRAST:  166mL ISOVUE-300 IOPAMIDOL (ISOVUE-300) INJECTION 61% COMPARISON:  08/12/2017 FINDINGS: Lower chest: Minimal bilateral pleural effusions with basilar  atelectasis. Prominent retrocrural fat. Hepatobiliary: No focal liver abnormality is seen. No gallstones, gallbladder wall thickening, or biliary dilatation. Pancreas: Unremarkable. No pancreatic ductal dilatation or surrounding inflammatory changes. Spleen: Normal in size without focal abnormality. Adrenals/Urinary Tract: 17 mm left adrenal gland nodule unchanged since previous study. Subcentimeter right adrenal gland nodule is also unchanged. Kidneys are atrophic. No hydronephrosis or hydroureter. Bladder is decompressed with a Foley catheter. Stomach/Bowel: Stomach is not abnormally distended. There is suggestion of wall thickening around the proximal duodenum which could represent duodenitis or duodenal ulcer. Duodenal mass not entirely excluded but no proximal obstruction is seen. Small bowel are mostly decompressed. Scattered stool in the colon. No colonic distention or wall thickening. Appendix is not identified. Vascular/Lymphatic: Aortic atherosclerosis. No enlarged abdominal or pelvic lymph nodes. Calcification demonstrated in the superior mesenteric artery may indicate an area of stenosis. The vessel remains patent. Reproductive: Calcified uterine fibroids. Uterus and ovaries are not enlarged. Other: No free air or free fluid in the abdomen. Abdominal wall musculature appears intact with fatty atrophy diffusely. Musculoskeletal: Degenerative changes in the spine. Diffuse demineralization. Multiple lumbar vertebral compression fractures. Changes likely represent osteoporosis. IMPRESSION: 1. Small bilateral pleural effusions with basilar atelectasis. 2. Bilateral adrenal gland nodules unchanged since previous study. Possible adrenal hyperplasia. 3. No evidence of bowel obstruction. Suggestion of wall thickening around the proximal duodenum this may represent duodenitis or ulcer disease. Can't entirely exclude a mass although the lack of proximal obstruction would argue against that. 4. Aortic  atherosclerosis. 5. Calcified uterine fibroids. Electronically Signed   By: Lucienne Capers M.D.   On: 09/04/2017 05:37    ____________________________________________   PROCEDURES  Critical Care performed: No   Procedure(s) performed:   Procedures   ____________________________________________   INITIAL IMPRESSION / ASSESSMENT AND PLAN / ED COURSE  Pertinent labs & imaging results that were available during my care of the patient were reviewed  by me and considered in my medical decision making (see chart for details).   I was in the middle of a procedure in another room when the patient arrived and I was told about possible urinary retention.  I authorized the placement of an indwelling Foley catheter until we could further evaluate the patient and to relieve her discomfort.  She had about 200 mL of urine in the bladder which was sent for urinalysis and was unremarkable.  It was at that point that she began complaining of abdominal pain and I obtained lab work which again was generally reassuring with no significant abnormalities of either the comprehensive metabolic panel nor the CBC. her lipase and lactic acid were similarly normal.  Her vital signs were normal throughout her stay.  She tolerated oral intake without difficulty.  I obtained a CT scan and reviewed the radiology report.  There is a question of possible duodenitis but the patient has no report of nausea nor vomiting and had no tenderness to palpation throughout her abdomen even with deep palpation.  I suspect she has some chronic changes in her GI system but there does not seem to be an acute abnormality at this time.  I will defer to her outpatient doctors regarding any additional treatment or workup, but at this time I can identify no acute or emergent medical condition.  I updated her about this information and was at that point that she told me about the numbness all throughout her body.  However she is neurologically  intact with no weakness and no focal neurological deficits and I explained to her that I do not have a specific reason but I think that if she continues taking her regular medicines and follows up as an outpatient they may be available to help her more.  She seemed satisfied with this and will go home back to her living facility for close outpatient follow-up.     ____________________________________________  FINAL CLINICAL IMPRESSION(S) / ED DIAGNOSES  Final diagnoses:  Paresthesia  Generalized abdominal pain     MEDICATIONS GIVEN DURING THIS VISIT:  Medications  morphine 2 MG/ML injection 2 mg (2 mg Intravenous Given 09/04/17 0432)  ondansetron (ZOFRAN) injection 4 mg (4 mg Intravenous Given 09/04/17 0431)  iopamidol (ISOVUE-300) 61 % injection 100 mL (100 mLs Intravenous Contrast Given 09/04/17 0441)     NEW OUTPATIENT MEDICATIONS STARTED DURING THIS VISIT:  New Prescriptions   No medications on file    Modified Medications   No medications on file    Discontinued Medications   GUAIFENESIN (MUCINEX) 600 MG 12 HR TABLET    Take 1 tablet (600 mg total) by mouth 2 (two) times daily.   LEVOFLOXACIN (LEVAQUIN) 500 MG TABLET    Take 1 tablet (500 mg total) by mouth daily.   SULFAMETHOXAZOLE-TRIMETHOPRIM (BACTRIM DS,SEPTRA DS) 800-160 MG TABLET    Take 1 tablet by mouth 2 (two) times daily.     Note:  This document was prepared using Dragon voice recognition software and may include unintentional dictation errors.    Hinda Kehr, MD 09/04/17 [phone]

## 2017-09-04 NOTE — ED Triage Notes (Signed)
Pt presents via EMS c/o abd pain. Pt states recently dx with UTI. Pt states unable to urinate since 0600 this am. Pt also reports abd cramping.

## 2017-09-04 NOTE — ED Notes (Signed)
Patient transported to CT 

## 2017-09-04 NOTE — Discharge Instructions (Signed)
As we discussed, your workup today was reassuring.  Though we do not know exactly what is causing your symptoms, it appears that you have no emergent medical condition at this time and that you are safe to go home and follow up as recommended in this paperwork. ° °Please return immediately to the Emergency Department if you develop any new or worsening symptoms that concern you. ° °

## 2017-09-04 NOTE — ED Triage Notes (Signed)
Pt from [facility].

## 2017-09-06 ENCOUNTER — Other Ambulatory Visit: Payer: Self-pay | Admitting: Family Medicine

## 2017-09-06 DIAGNOSIS — M6281 Muscle weakness (generalized): Secondary | ICD-10-CM

## 2017-09-06 DIAGNOSIS — M792 Neuralgia and neuritis, unspecified: Secondary | ICD-10-CM

## 2017-09-11 ENCOUNTER — Emergency Department
Admission: EM | Admit: 2017-09-11 | Discharge: 2017-09-11 | Disposition: A | Discharge status: Other Acute Inpatient Hospital | Payer: Medicare (Managed Care) | Attending: Emergency Medicine | Admitting: Emergency Medicine

## 2017-09-11 ENCOUNTER — Emergency Department: Payer: Medicare (Managed Care)

## 2017-09-11 DIAGNOSIS — E119 Type 2 diabetes mellitus without complications: Secondary | ICD-10-CM | POA: Diagnosis not present

## 2017-09-11 DIAGNOSIS — J449 Chronic obstructive pulmonary disease, unspecified: Secondary | ICD-10-CM | POA: Diagnosis not present

## 2017-09-11 DIAGNOSIS — R05 Cough: Secondary | ICD-10-CM | POA: Insufficient documentation

## 2017-09-11 DIAGNOSIS — R202 Paresthesia of skin: Secondary | ICD-10-CM | POA: Diagnosis present

## 2017-09-11 DIAGNOSIS — R600 Localized edema: Secondary | ICD-10-CM | POA: Insufficient documentation

## 2017-09-11 DIAGNOSIS — M4854XA Collapsed vertebra, not elsewhere classified, thoracic region, initial encounter for fracture: Secondary | ICD-10-CM | POA: Insufficient documentation

## 2017-09-11 DIAGNOSIS — R0602 Shortness of breath: Secondary | ICD-10-CM | POA: Diagnosis not present

## 2017-09-11 DIAGNOSIS — S22000A Wedge compression fracture of unspecified thoracic vertebra, initial encounter for closed fracture: Secondary | ICD-10-CM

## 2017-09-11 DIAGNOSIS — I129 Hypertensive chronic kidney disease with stage 1 through stage 4 chronic kidney disease, or unspecified chronic kidney disease: Secondary | ICD-10-CM | POA: Diagnosis not present

## 2017-09-11 DIAGNOSIS — F17210 Nicotine dependence, cigarettes, uncomplicated: Secondary | ICD-10-CM | POA: Insufficient documentation

## 2017-09-11 DIAGNOSIS — Z79899 Other long term (current) drug therapy: Secondary | ICD-10-CM | POA: Diagnosis not present

## 2017-09-11 DIAGNOSIS — N189 Chronic kidney disease, unspecified: Secondary | ICD-10-CM | POA: Insufficient documentation

## 2017-09-11 LAB — CBC WITH DIFFERENTIAL/PLATELET
BASOS PCT: 1 %
Basophils Absolute: 0 10*3/uL (ref 0–0.1)
Eosinophils Absolute: 0.1 10*3/uL (ref 0–0.7)
Eosinophils Relative: 1 %
HEMATOCRIT: 32.8 % — AB (ref 35.0–47.0)
HEMOGLOBIN: 11.1 g/dL — AB (ref 12.0–16.0)
LYMPHS ABS: 0.6 10*3/uL — AB (ref 1.0–3.6)
LYMPHS PCT: 9 %
MCH: 32.7 pg (ref 26.0–34.0)
MCHC: 34 g/dL (ref 32.0–36.0)
MCV: 96.2 fL (ref 80.0–100.0)
MONO ABS: 0.4 10*3/uL (ref 0.2–0.9)
MONOS PCT: 6 %
NEUTROS ABS: 5.8 10*3/uL (ref 1.4–6.5)
NEUTROS PCT: 83 %
Platelets: 215 10*3/uL (ref 150–440)
RBC: 3.41 MIL/uL — ABNORMAL LOW (ref 3.80–5.20)
RDW: 16.3 % — AB (ref 11.5–14.5)
WBC: 6.9 10*3/uL (ref 3.6–11.0)

## 2017-09-11 LAB — COMPREHENSIVE METABOLIC PANEL
ALBUMIN: 3.8 g/dL (ref 3.5–5.0)
ALK PHOS: 113 U/L (ref 38–126)
ALT: 13 U/L — ABNORMAL LOW (ref 14–54)
ANION GAP: 8 (ref 5–15)
AST: 16 U/L (ref 15–41)
BILIRUBIN TOTAL: 0.6 mg/dL (ref 0.3–1.2)
BUN: 13 mg/dL (ref 6–20)
CALCIUM: 9.2 mg/dL (ref 8.9–10.3)
CO2: 31 mmol/L (ref 22–32)
Chloride: 97 mmol/L — ABNORMAL LOW (ref 101–111)
Creatinine, Ser: 0.81 mg/dL (ref 0.44–1.00)
GFR calc Af Amer: >60 mL/min (ref >60)
GLUCOSE: 128 mg/dL — AB (ref 65–99)
POTASSIUM: 4.1 mmol/L (ref 3.5–5.1)
Sodium: 136 mmol/L (ref 135–145)
TOTAL PROTEIN: 6.5 g/dL (ref 6.5–8.1)

## 2017-09-11 LAB — FIBRIN DERIVATIVES D-DIMER (ARMC ONLY): Fibrin derivatives D-dimer (ARMC): 4158.21 ng/mL (FEU) — ABNORMAL HIGH (ref 0.00–499.00)

## 2017-09-11 LAB — BLOOD GAS, VENOUS
Acid-Base Excess: 9.9 mmol/L — ABNORMAL HIGH (ref 0.0–2.0)
Bicarbonate: 36.9 mmol/L — ABNORMAL HIGH (ref 20.0–28.0)
O2 Saturation: 76.9 %
PATIENT TEMPERATURE: 37
pCO2, Ven: 61 mmHg — ABNORMAL HIGH (ref 44.0–60.0)
pH, Ven: 7.39 (ref 7.250–7.430)
pO2, Ven: 42 mmHg (ref 32.0–45.0)

## 2017-09-11 LAB — BRAIN NATRIURETIC PEPTIDE: B Natriuretic Peptide: 113 pg/mL — ABNORMAL HIGH (ref 0.0–100.0)

## 2017-09-11 LAB — TROPONIN I

## 2017-09-11 MED ORDER — IPRATROPIUM-ALBUTEROL 0.5-2.5 (3) MG/3ML IN SOLN
3.0000 mL | Freq: Once | RESPIRATORY_TRACT | Status: AC
Start: 2017-09-11 — End: 2017-09-11
  Administered 2017-09-11: 3 mL via RESPIRATORY_TRACT

## 2017-09-11 MED ORDER — MORPHINE SULFATE (PF) 4 MG/ML IV SOLN
4.0000 mg | Freq: Once | INTRAVENOUS | Status: AC
  Administered 2017-09-11: 4 mg via INTRAVENOUS
  Filled 2017-09-11: qty 1

## 2017-09-11 MED ORDER — IOPAMIDOL (ISOVUE-370) INJECTION 76%
75.0000 mL | Freq: Once | INTRAVENOUS | Status: AC | PRN
  Administered 2017-09-11: 75 mL via INTRAVENOUS

## 2017-09-11 MED ORDER — IPRATROPIUM-ALBUTEROL 0.5-2.5 (3) MG/3ML IN SOLN
RESPIRATORY_TRACT | Status: AC
  Filled 2017-09-11: qty 3

## 2017-09-11 MED ORDER — ONDANSETRON HCL 4 MG/2ML IJ SOLN
4.0000 mg | Freq: Once | INTRAMUSCULAR | Status: AC
  Administered 2017-09-11: 4 mg via INTRAVENOUS
  Filled 2017-09-11: qty 2

## 2017-09-11 NOTE — ED Triage Notes (Signed)
Pt comes into the ED via EMS from [facility] with c/o increased SOB, was sx with pnx and was to start levaquin today but wanted to be checked out. States she has had to have fluid drawn off her lungs before.Marland Kitchen

## 2017-09-11 NOTE — ED Notes (Signed)
[facility] information reviewed. Signatures in place.

## 2017-09-11 NOTE — ED Notes (Signed)
[facility] was called to initiate transfer per Dr. Sonny Dandy orders.

## 2017-09-11 NOTE — ED Notes (Signed)
Pt to mri 

## 2017-09-11 NOTE — ED Notes (Signed)
Patient accepted to [facility] has been called to transport patient.

## 2017-09-11 NOTE — ED Notes (Signed)
Resumed care from [place], [place]. Pt resting with family at bedside. Pt is obviously sob and coughing during assessment. Pt O2 saturation is 96% on 3L.

## 2017-09-11 NOTE — ED Provider Notes (Signed)
PRESSION: 1. No pulmonary emboli. 2. Enlarged central pulmonary arteries, compatible with pulmonary arterial hypertension. 3. Interval approximately 70% T6 vertebral body compression fracture with anteriorly displaced fragments and no significant retropulsion. 4. Small hiatal hernia. 5. Bilateral linear atelectasis and/or scarring. 6. Calcified aortic atherosclerosis and minimal calcified coronary artery atherosclerosis.  Aortic Atherosclerosis (ICD10-I70.0).   Electronically Signed   By: Claudie Revering M.D.   On: 09/11/2017 17:58  patient complains of a lot of shortness of breath I therefore CT her chest especially in view of the d-dimer over 4000 the above results are noted  Study Result   CLINICAL DATA:  Initial evaluation for acute mid back/ thoracic spine pain.  EXAM: MRI THORACIC AND LUMBAR SPINE WITHOUT CONTRAST  TECHNIQUE: Multiplanar and multiecho pulse sequences of the thoracic and lumbar spine were obtained without intravenous contrast.  COMPARISON:  Prior CT from 09/04/2017.  FINDINGS: MRI THORACIC SPINE FINDINGS  Alignment: Exaggeration of the normal thoracic kyphosis. No listhesis.  Vertebrae: There is abnormal T1 hypointense, T2/STIR hyperintense signal intensity extending through the T5 and T6 vertebral bodies subtle linear hypodensities extending through the inferior aspect of T5 and mid aspect of T6 suggestive of acute fractures, most obvious at the T6 level. There is associated height loss of up to 40% at the central aspect of the T5 level without bony retropulsion. Associated height loss of up to 40% at T6 with trace 2-3 mm bony retropulsion. Abnormal edema extends into the posterior elements, particularly at the T6 level with extension into the T6-7 interspinous ligaments. Edema present within the T5-6 and T6-7 interspaces as well, with small amount of edema within the adjacent superior endplate of T7. No definite associated fracture at  the T7 level identified. Endplates are relatively preserved at this time without significant erosive changes. There is a superimposed epidural collection at the level of T6-7 involving the dorsal epidural space measuring approximately 4 x 11 x 819 mm (series 6, image 9). Secondary severe canal stenosis due to retropulsed bone and epidural collection with impingement of the thoracic spinal cord at the level of T6. Thecal sac measures 5 mm in AP diameter at its most narrow point (series 7, image 19). No appreciable cord signal changes. While these findings may reflect acute fractures with associated epidural hematoma, possible acute infection with osteomyelitis discitis could also be considered in the correct clinical setting.  Cord: Cord impingement and compression at the T6 level. No definite cord signal changes. Signal intensity within the thoracic spinal cord otherwise normal.  Paraspinal and other soft tissues: Extensive paraspinous edema adjacent to the thoracic spine none at the level of T4-5 through T6-7. This may reflect edema related to fracture or possibly paraspinous phlegmon. Paraspinous soft tissues otherwise within normal limits.  Disc levels:  On counter sequence, degenerative disc bulging noted within the cervical spine without significant stenosis.  No other significant degenerative changes seen within the thoracic spine.  MRI LUMBAR SPINE FINDINGS  Segmentation: Transitional lumbosacral anatomy with partial lumbarization of the S1 segment. Vertebral body count is made from the C2 level.  Alignment: Trace retrolisthesis of L5 on S1. Mild exaggeration of the normal lumbar lordosis.  Vertebrae: Compression deformities involving the L3 and L4 vertebral bodies are chronic in appearance without associated marrow edema. Vertebral body heights otherwise maintained. No acute fracture or malalignment. Bone marrow signal intensity within normal  limits. Benign hemangioma noted within the inferior endplate of L2. No worrisome osseous lesions.  Conus medullaris: Extends to the L2 level and  appears normal.  Paraspinal and other soft tissues: Paraspinous soft tissues demonstrate no acute abnormality. Chronic fatty atrophy noted within the posterior paraspinous musculature. Atherosclerotic change present within the intra-abdominal aorta. Visualized visceral structures within normal limits.  Disc levels:  L1-2:  Unremarkable.  L2-3: Mild diffuse circumferential disc bulge with disc desiccation. Mild facet and ligamentum flavum hypertrophy. No stenosis.  L3-4: Mild diffuse disc bulge with disc desiccation. Trace retropulsion of the L4 vertebral body related to remote compression fracture. Facet and ligamentum flavum hypertrophy. Resultant moderate spinal stenosis. No significant foraminal encroachment.  L4-5: Disc desiccation without significant disc bulge. Moderate facet and ligamentum flavum hypertrophy. Mild canal stenosis. No significant foraminal encroachment.  L5-S1: Shallow posterior disc bulge. Moderate facet arthrosis, worse on the left. Ligamentum flavum hypertrophy. Mild epidural lipomatosis. Mild spinal stenosis. No significant lateral recess narrowing. Foramina are patent.  IMPRESSION: MR THORACIC SPINE IMPRESSION  1. Abnormal signal intensity involving the T5 and T6 vertebral bodies with associated fractures as above. Associated height loss of up to 40% at T5 and T6 with 2-3 mm bony retropulsion at the level of T6. Edema extends through the posterior elements at the T6 level, with possible posterior ligamentous injury. Fractures should be treated as an unstable injury. Associated epidural collection (presumably hemorrhage) at the level of T6 with fairly severe spinal stenosis and impingement of the thoracic spinal cord. Extensive associated paraspinous edema as well. While these findings are  most likely posttraumatic in nature, appearance is somewhat unusual, and possible underlying infection could be considered in the correct clinical setting. Correlation with symptomatology and laboratory values recommended. Postcontrast imaging may be helpful for further delineation as indicated.  MR LUMBAR SPINE IMPRESSION  1. No acute abnormality within the lumbar spine. 2. Chronic compression deformities involving the L3 and L4 vertebral bodies. 3. Multifactorial degenerative changes at L3-4 with resultant moderate spinal stenosis. 4. Prominent facet arthrosis at L5-S1, greater on the left.   Electronically Signed   By: Jeannine Boga M.D.   On: 09/11/2017 21:27   discussed CT and MR results with radiology and then with Dr. Aris Lot neurosurgery at [facility]. Since we usually transfer under general neurosurgical cases to [facility]. Dr. Aris Lot excepts transfer ER to ER. Of interest is the fact that the patient said she fell 31st of last month and the numbness she tells me it appeared after she was discharged from [facility]. She is where she went for her arm fracture. She did not injure herself again she says.  critical care time one hour including discussion with radiology, patient, her husband reexamination of the patient and consideration of testing that should be ordered.also included his diet discussion with transfer center and neurosurgery at Rosine Abe, MD 09/11/17 2154

## 2017-09-11 NOTE — ED Notes (Signed)
Pt's iv was leaking so this nurse removed it and placed another. No infiltration, redness, pain noted.

## 2017-09-11 NOTE — ED Provider Notes (Addendum)
[facility] Emergency Department Provider Note  ____________________________________________   I have reviewed the triage vital signs and the nursing notes.   HISTORY  Chief Complaint Shortness of Breath    HPI Sara Pierce is a 76 y.o. female who presents today complaining of multiple things. Patient has a very vague history and is very difficult to tell why she is here. She has tingling all over her entire body, sometimes she has tingling "on the inside", she denies a focal numbness or weakness however. She has chronic shortness of breath and chronic lower extremity edema and all that seems to be somewhat worse to her recently. She denies any fever, she has had a cough recently. Someone told her she had a pneumonia but she is not sure exactly when that was or whether they did a chest x-ray. She is at her baseline according to her husband is in the room. Her husband would like me to perform an alpha-1 antitrypsin test on her, which we do not do in the emergency room and I have tried to explain this. Patient herself denies ongoing pain but she states she gets pain sometimes, she also feels that she is generally swollen and she is concerned about her shortness of breath. She also feels that her abdomen much larger to her than normal;, she had a recent negative CT scan and the symptoms predate that. She states she is eating  Level 5 chart caveat; no further history available due to patient status.   Past Medical History:  Diagnosis Date  . Arthritis   . Cardiomyopathy ([place])   . Cataract   . Cerebral hemorrhage ([place])   . Chronic kidney disease   . Clotting disorder ([place])   . COPD (chronic obstructive pulmonary disease) ([place])   . Depression   . Diabetes mellitus without complication ([place])   . Hypertension   . Mixed incontinence   . Obesity   . Sleep apnea   . Thyroid disease   . Urinary frequency   . Vaginal atrophy   . Varicose veins     Patient Active  Problem List   Diagnosis Date Noted  . HCAP (healthcare-associated pneumonia) 08/19/2017  . CAP (community acquired pneumonia) 10/03/2016  . Sepsis ([place]) 11/15/2015  . Absolute anemia 03/15/2015  . Anxiety 03/15/2015  . Arthritis 03/15/2015  . Cardiomyopathy, secondary ([place]) 03/15/2015  . Cataract 03/15/2015  . Brain bleed ([place]) 03/15/2015  . Chronic constipation 03/15/2015  . Chronic kidney failure 03/15/2015  . Blood clotting disorder ([place]) 03/15/2015  . CAFL (chronic airflow limitation) ([place]) 03/15/2015  . Clinical depression 03/15/2015  . Diabetes ([place]) 03/15/2015  . H/O varicella 03/15/2015  . BP (high blood pressure) 03/15/2015  . Mixed incontinence 03/15/2015  . 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 Adiposity 03/15/2015  . Apnea, sleep 03/15/2015  . Disease of thyroid gland 03/15/2015  . FOM (frequency of micturition) 03/15/2015  . 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 Candida vaginitis 03/15/2015  . Abnormal ECG 03/15/2015    Past Surgical History:  Procedure Laterality Date  . APPENDECTOMY    . Cardiac Catherization    . COLONOSCOPY WITH PROPOFOL N/A 04/28/2016   Procedure: COLONOSCOPY WITH PROPOFOL;  Surgeon: Manya Silvas, MD;  Location: [facility] ENDOSCOPY;  Service: Endoscopy;  Laterality: N/A;  . COLONOSCOPY WITH PROPOFOL N/A 04/29/2016   Procedure: COLONOSCOPY WITH PROPOFOL;  Surgeon: Manya Silvas, MD;  Location: [facility] ENDOSCOPY;  Service: Endoscopy;  Laterality: N/A;  .  JOINT REPLACEMENT    . STRABISMUS SURGERY    . TONSILLECTOMY    . TUBAL LIGATION      Prior to Admission medications   Medication Sig Start Date End Date Taking? Authorizing Provider  acetaminophen (TYLENOL) 650 MG CR tablet Take 650 mg by mouth every 6 (six) hours.     [provider]  albuterol (PROVENTIL HFA;VENTOLIN HFA) 108 (90 Base) MCG/ACT inhaler Inhale 2 puffs into the lungs every 6 (six) hours as needed for wheezing or shortness of  breath. Patient taking differently: Inhale 2 puffs into the lungs every 4 (four) hours as needed for wheezing or shortness of breath.  06/15/17   Rudene Re, MD  albuterol (PROVENTIL) (2.5 MG/3ML) 0.083% nebulizer solution Take 2.5 mg by nebulization every 4 (four) hours as needed for wheezing or shortness of breath.    [provider]  alum & mag hydroxide-simeth (MAALOX/MYLANTA) 200-200-20 MG/5ML suspension Take 15 mLs by mouth every 6 (six) hours as needed for indigestion or heartburn.    [provider]  buPROPion (WELLBUTRIN XL) 150 MG 24 hr tablet Take 150 mg by mouth daily.    [provider]  cyclobenzaprine (FLEXERIL) 5 MG tablet Take 5 mg by mouth 2 (two) times daily as needed for muscle spasms.    [provider]  diphenhydrAMINE (BENADRYL) 25 MG tablet Take 25 mg by mouth daily as needed (burning/ flushing).    [provider]  docusate sodium (COLACE) 100 MG capsule Take 1 capsule (100 mg total) by mouth 2 (two) times daily as needed for mild constipation. 10/04/16   Gouru, Illene Silver, MD  FLUoxetine (PROZAC) 40 MG capsule Take 40 mg by mouth daily.    [provider]  fluticasone (FLONASE) 50 MCG/ACT nasal spray Place 2 sprays into both nostrils daily.     [provider]  furosemide (LASIX) 40 MG tablet Take 40 mg by mouth daily.     [provider]  guaiFENesin-dextromethorphan (ROBITUSSIN DM) 100-10 MG/5ML syrup Take 10 mLs by mouth every 6 (six) hours as needed for cough. Patient taking differently: Take 10 mLs by mouth every 4 (four) hours as needed for cough.  10/04/16   Nicholes [place], MD  hydrOXYzine (ATARAX/VISTARIL) 25 MG tablet Take 25 mg by mouth 2 (two) times daily as needed for anxiety.    [provider]  ipratropium-albuterol (DUONEB) 0.5-2.5 (3) MG/3ML SOLN Inhale 3 mLs into the lungs every 6 (six) hours as needed (wheezing).     [provider]  liothyronine (CYTOMEL) 25 MCG tablet  Take 25 mcg by mouth daily.     [provider]  magnesium oxide (MAG-OX) 400 MG tablet Take 400 mg by mouth daily.     [provider]  montelukast (SINGULAIR) 10 MG tablet Take 10 mg by mouth at bedtime.     [provider]  oxyCODONE (OXY IR/ROXICODONE) 5 MG immediate release tablet Take 5 mg by mouth 3 (three) times daily.     [provider]  polyethylene glycol (MIRALAX / GLYCOLAX) packet Take 17 g by mouth daily.    [provider]  potassium chloride (K-DUR) 10 MEQ tablet Take 10 mEq by mouth daily.     [provider]  predniSONE (DELTASONE) 10 MG tablet Take 10 mg by mouth daily with breakfast.    [provider]  primidone (MYSOLINE) 50 MG tablet Take 50 mg by mouth 2 (two) times daily.     [provider]  senna-docusate (  SENOKOT-S) 8.6-50 MG tablet Take 2 tablets by mouth daily.    [provider]  simvastatin (ZOCOR) 40 MG tablet Take 40 mg by mouth at bedtime.     [provider]  sodium chloride (OCEAN) 0.65 % SOLN nasal spray Place 2 sprays into both nostrils 3 (three) times daily as needed for congestion. 10/04/16   Nicholes [place], MD  spironolactone (ALDACTONE) 25 MG tablet Take 1 tablet (25 mg total) by mouth daily. 11/19/15   Gladstone Lighter, MD  traMADol (ULTRAM) 50 MG tablet Take 50 mg by mouth every 6 (six) hours as needed for moderate pain.    [provider]  umeclidinium-vilanterol (ANORO ELLIPTA) 62.5-25 MCG/INH AEPB Inhale 1 puff into the lungs daily.    [provider]    Allergies Aspirin; Codeine; and Tape  Family History  Problem Relation Age of Onset  . Heart attack Father   . Coronary artery disease Father   . Hypertension Father   . Hyperlipidemia Father   . Heart attack Mother   . Hypertension Mother   . Hyperlipidemia Mother   . Breast cancer Neg Hx     Social History Social History  Substance Use Topics  . Smoking status: Former Smoker     Packs/day: 0.50    Years: 44.00    Types: Cigarettes  . Smokeless tobacco: Never Used  . Alcohol use No    Review of Systems Constitutional: No fever/chills Eyes: No visual changes. ENT: No sore throat. No stiff neck no neck pain Cardiovascular: patient does not seem to endorse chest pain but sometimes states that she might have a Respiratory: positiveshortness of breath. Gastrointestinal:   no vomiting.  No diarrhea.  No constipation. Genitourinary: Negative for dysuria. Musculoskeletal: Negative lower extremity swelling Skin: Negative for rash. Neurological: Negative for severe headaches, focal weakness or numbness.   ____________________________________________   PHYSICAL EXAM:  VITAL SIGNS: ED Triage Vitals  Enc Vitals Group     BP 09/11/17 1312 116/85     Pulse Rate 09/11/17 1312 76     Resp 09/11/17 1312 20     Temp 09/11/17 1312 98.4 F (36.9 C)     Temp Source 09/11/17 1309 Oral     SpO2 09/11/17 1312 99 %     Weight 09/11/17 1309 235 lb (106.6 kg)     Height 09/11/17 1309 5\' 5"  (1.651 m)     Head Circumference --      Peak Flow --      Pain Score 09/11/17 1309 9     Pain Loc --      Pain Edu? --      Excl. in [place]? --     Constitutional: Alert and orientedto name place and date with very poor historian. Well appearing and in no acute distress. Eyes: Conjunctivae are normal Head: Atraumatic HEENT: No congestion/rhinnorhea. Mucous membranes are moist.  Oropharynx non-erythematous Neck:   Nontender with no meningismus, no masses, no stridor Cardiovascular: Normal rate, regular rhythm. Grossly normal heart sounds.  Good peripheral circulation. Respiratory: Normal respiratory effort.  No retractions. 2 minutes in the bases but poor respiratory effort and morbid obesity limits exam Abdominal: Soft and nontender. No distention. No guarding no reboundmorbid obesity limits exam Back:  There is no focal tenderness or step off.  there is no midline tenderness there  are no lesions noted. there is no CVA tenderness Musculoskeletal: No lower extremity tenderness, no upper extremity tenderness. No joint effusions, no DVT signs strong distal pulses  symmetricbilateral pittingedema,chronic appearing Neurologic:  Normal speech and language. No gross focal neurologic deficits are appreciated.  Skin:  Skin is warm, dry and intact. No rash noted. Psychiatric: Mood and affect are normal. Speech and behavior are normal.  ____________________________________________   LABS (all labs ordered are listed, but only abnormal results are displayed)  Labs Reviewed  BRAIN NATRIURETIC PEPTIDE  CBC WITH DIFFERENTIAL/PLATELET  COMPREHENSIVE METABOLIC PANEL  URINALYSIS, COMPLETE (UACMP) WITH MICROSCOPIC  TROPONIN I    Pertinent labs  results that were available during my care of the patient were reviewed by me and considered in my medical decision making (see chart for details). ____________________________________________  EKG  I personally interpreted any EKGs ordered by me or triage sinus rhythm at 73 bpm normal axis no acute ST elevation or depression, nonspecific ST changes ____________________________________________  RADIOLOGY  Pertinent labs & imaging results that were available during my care of the patient were reviewed by me and considered in my medical decision making (see chart for details). If possible, patient and/or family made aware of any abnormal findings. ____________________________________________    PROCEDURES  Procedure(s) performed: None  Procedures  Critical Care performed: None  ____________________________________________   INITIAL IMPRESSION / ASSESSMENT AND PLAN / ED COURSE  Pertinent labs & imaging results that were available during my care of the patient were reviewed by me and considered in my medical decision making (see chart for details).  H and here with shortness of breath and cough which I think her headache U  discussed symptoms of the multiple symptoms she complains about certainly no evidence of CVA although she is "tingling everywhere" there is no evidence of sensory or neurologic deficit. The patient has no chest pain at this time although she states she's had some the past. Etc. We are going to check BNP to see the degree of heart failure which certainly could be evident, CBC, troponin and blood work and reassess. Initial chest x-ray does not show anything significant. Low suspicion for PE given symptoms. She is on 3 L oxygen all the time and is satting 100% on that.   ----------------------------------------- 3:22 PM on 09/11/2017 -----------------------------------------  signed out to dr. Rip Harbour at the end of my shift. I did d/w dr. Heloise Beecham of [facility] who feels these seem to be mostly chronic complaints and that pt can likely go home if testing is reassuring. They can arrange outpt f/u with pcp tomorrow     ____________________________________________   FINAL CLINICAL IMPRESSION(S) / ED DIAGNOSES  Final diagnoses:  None      This chart was dictated using voice recognition software.  Despite best efforts to proofread,  errors can occur which can change meaning.      Schuyler Amor, MD 09/11/17 1506    Schuyler Amor, MD 09/11/17 [phone]

## 2017-09-15 ENCOUNTER — Ambulatory Visit: Payer: Medicare (Managed Care)

## 2017-10-14 ENCOUNTER — Other Ambulatory Visit: Payer: Self-pay | Admitting: Neurological Surgery

## 2017-10-14 DIAGNOSIS — S22000A Wedge compression fracture of unspecified thoracic vertebra, initial encounter for closed fracture: Secondary | ICD-10-CM

## 2017-10-18 ENCOUNTER — Other Ambulatory Visit: Payer: Self-pay | Admitting: Family Medicine

## 2017-10-18 ENCOUNTER — Ambulatory Visit
Admission: RE | Admit: 2017-10-18 | Discharge: 2017-10-18 | Disposition: A | Discharge status: Home / Self Care | Payer: Medicare (Managed Care) | Source: Ambulatory Visit | Attending: Neurological Surgery | Admitting: Neurological Surgery

## 2017-10-18 DIAGNOSIS — I700 Atherosclerosis of aorta: Secondary | ICD-10-CM | POA: Insufficient documentation

## 2017-10-18 DIAGNOSIS — S22000A Wedge compression fracture of unspecified thoracic vertebra, initial encounter for closed fracture: Secondary | ICD-10-CM | POA: Diagnosis not present

## 2017-10-18 DIAGNOSIS — X58XXXA Exposure to other specified factors, initial encounter: Secondary | ICD-10-CM | POA: Diagnosis not present

## 2017-10-18 DIAGNOSIS — M8580 Other specified disorders of bone density and structure, unspecified site: Secondary | ICD-10-CM | POA: Diagnosis not present

## 2017-10-18 DIAGNOSIS — S22009G Unspecified fracture of unspecified thoracic vertebra, subsequent encounter for fracture with delayed healing: Secondary | ICD-10-CM

## 2017-10-23 ENCOUNTER — Inpatient Hospital Stay
Admission: EM | Admit: 2017-10-23 | Discharge: 2017-10-28 | DRG: 190 | Disposition: A | Discharge status: Skilled Nursing Facility | Payer: Medicare (Managed Care) | Attending: Internal Medicine | Admitting: Internal Medicine

## 2017-10-23 ENCOUNTER — Emergency Department: Payer: Medicare (Managed Care)

## 2017-10-23 ENCOUNTER — Other Ambulatory Visit: Payer: Self-pay

## 2017-10-23 DIAGNOSIS — R52 Pain, unspecified: Secondary | ICD-10-CM

## 2017-10-23 DIAGNOSIS — Z79899 Other long term (current) drug therapy: Secondary | ICD-10-CM

## 2017-10-23 DIAGNOSIS — M4854XD Collapsed vertebra, not elsewhere classified, thoracic region, subsequent encounter for fracture with routine healing: Secondary | ICD-10-CM | POA: Diagnosis present

## 2017-10-23 DIAGNOSIS — W19XXXD Unspecified fall, subsequent encounter: Secondary | ICD-10-CM | POA: Diagnosis present

## 2017-10-23 DIAGNOSIS — E785 Hyperlipidemia, unspecified: Secondary | ICD-10-CM | POA: Diagnosis present

## 2017-10-23 DIAGNOSIS — B9562 Methicillin resistant Staphylococcus aureus infection as the cause of diseases classified elsewhere: Secondary | ICD-10-CM | POA: Diagnosis present

## 2017-10-23 DIAGNOSIS — S42302D Unspecified fracture of shaft of humerus, left arm, subsequent encounter for fracture with routine healing: Secondary | ICD-10-CM

## 2017-10-23 DIAGNOSIS — J441 Chronic obstructive pulmonary disease with (acute) exacerbation: Secondary | ICD-10-CM | POA: Diagnosis not present

## 2017-10-23 DIAGNOSIS — R55 Syncope and collapse: Secondary | ICD-10-CM | POA: Diagnosis not present

## 2017-10-23 DIAGNOSIS — R000 Tachycardia, unspecified: Secondary | ICD-10-CM | POA: Diagnosis present

## 2017-10-23 DIAGNOSIS — G4730 Sleep apnea, unspecified: Secondary | ICD-10-CM | POA: Diagnosis present

## 2017-10-23 DIAGNOSIS — Z87891 Personal history of nicotine dependence: Secondary | ICD-10-CM

## 2017-10-23 DIAGNOSIS — Z6841 Body Mass Index (BMI) 40.0 and over, adult: Secondary | ICD-10-CM

## 2017-10-23 DIAGNOSIS — L8990 Pressure ulcer of unspecified site, unspecified stage: Secondary | ICD-10-CM | POA: Diagnosis present

## 2017-10-23 DIAGNOSIS — F329 Major depressive disorder, single episode, unspecified: Secondary | ICD-10-CM | POA: Diagnosis present

## 2017-10-23 DIAGNOSIS — R7881 Bacteremia: Secondary | ICD-10-CM | POA: Diagnosis present

## 2017-10-23 DIAGNOSIS — I10 Essential (primary) hypertension: Secondary | ICD-10-CM | POA: Diagnosis present

## 2017-10-23 DIAGNOSIS — Z8249 Family history of ischemic heart disease and other diseases of the circulatory system: Secondary | ICD-10-CM

## 2017-10-23 DIAGNOSIS — R0603 Acute respiratory distress: Secondary | ICD-10-CM

## 2017-10-23 DIAGNOSIS — E1140 Type 2 diabetes mellitus with diabetic neuropathy, unspecified: Secondary | ICD-10-CM | POA: Diagnosis present

## 2017-10-23 DIAGNOSIS — I4510 Unspecified right bundle-branch block: Secondary | ICD-10-CM | POA: Diagnosis present

## 2017-10-23 DIAGNOSIS — J9621 Acute and chronic respiratory failure with hypoxia: Secondary | ICD-10-CM | POA: Diagnosis present

## 2017-10-23 LAB — COMPREHENSIVE METABOLIC PANEL
ALBUMIN: 3.6 g/dL (ref 3.5–5.0)
ALT: 27 U/L (ref 14–54)
AST: 39 U/L (ref 15–41)
Alkaline Phosphatase: 74 U/L (ref 38–126)
Anion gap: 11 (ref 5–15)
BUN: 20 mg/dL (ref 6–20)
CHLORIDE: 97 mmol/L — AB (ref 101–111)
CO2: 32 mmol/L (ref 22–32)
CREATININE: 0.67 mg/dL (ref 0.44–1.00)
Calcium: 9.4 mg/dL (ref 8.9–10.3)
GFR calc non Af Amer: >60 mL/min (ref >60)
Glucose, Bld: 180 mg/dL — ABNORMAL HIGH (ref 65–99)
Potassium: 4.8 mmol/L (ref 3.5–5.1)
SODIUM: 140 mmol/L (ref 135–145)
Total Bilirubin: 0.5 mg/dL (ref 0.3–1.2)
Total Protein: 6.7 g/dL (ref 6.5–8.1)

## 2017-10-23 LAB — CBC WITH DIFFERENTIAL/PLATELET
Basophils Absolute: 0 10*3/uL (ref 0–0.1)
Basophils Relative: 0 %
EOS ABS: 0 10*3/uL (ref 0–0.7)
Eosinophils Relative: 0 %
HCT: 36.9 % (ref 35.0–47.0)
Hemoglobin: 12.2 g/dL (ref 12.0–16.0)
LYMPHS PCT: 28 %
Lymphs Abs: 2.6 10*3/uL (ref 1.0–3.6)
MCH: 31.3 pg (ref 26.0–34.0)
MCHC: 32.9 g/dL (ref 32.0–36.0)
MCV: 95 fL (ref 80.0–100.0)
Monocytes Absolute: 0.2 10*3/uL (ref 0.2–0.9)
Monocytes Relative: 2 %
NEUTROS ABS: 6.6 10*3/uL — AB (ref 1.4–6.5)
Neutrophils Relative %: 70 %
Platelets: 304 10*3/uL (ref 150–440)
RBC: 3.89 MIL/uL (ref 3.80–5.20)
RDW: 14.6 % — ABNORMAL HIGH (ref 11.5–14.5)
WBC: 9.4 10*3/uL (ref 3.6–11.0)

## 2017-10-23 LAB — BLOOD GAS, VENOUS
Acid-Base Excess: 8.2 mmol/L — ABNORMAL HIGH (ref 0.0–2.0)
Bicarbonate: 35.3 mmol/L — ABNORMAL HIGH (ref 20.0–28.0)
O2 Saturation: 92.7 %
PATIENT TEMPERATURE: 37
PCO2 VEN: 61 mmHg — AB (ref 44.0–60.0)
PH VEN: 7.37 (ref 7.250–7.430)
PO2 VEN: 68 mmHg — AB (ref 32.0–45.0)

## 2017-10-23 LAB — TROPONIN I

## 2017-10-23 LAB — PROTIME-INR
INR: 0.88
PROTHROMBIN TIME: 11.9 s (ref 11.4–15.2)

## 2017-10-23 LAB — BRAIN NATRIURETIC PEPTIDE: B Natriuretic Peptide: 41 pg/mL (ref 0.0–100.0)

## 2017-10-23 LAB — LACTIC ACID, PLASMA: LACTIC ACID, VENOUS: 2 mmol/L — AB (ref 0.5–1.9)

## 2017-10-23 MED ORDER — MAGNESIUM SULFATE 2 GM/50ML IV SOLN
INTRAVENOUS | Status: AC
  Filled 2017-10-23: qty 50

## 2017-10-23 MED ORDER — IPRATROPIUM-ALBUTEROL 0.5-2.5 (3) MG/3ML IN SOLN
3.0000 mL | Freq: Once | RESPIRATORY_TRACT | Status: AC
  Administered 2017-10-23: 3 mL via RESPIRATORY_TRACT

## 2017-10-23 MED ORDER — MAGNESIUM SULFATE 2 GM/50ML IV SOLN
2.0000 g | Freq: Once | INTRAVENOUS | Status: AC
  Administered 2017-10-23: 2 g via INTRAVENOUS

## 2017-10-23 MED ORDER — IPRATROPIUM-ALBUTEROL 0.5-2.5 (3) MG/3ML IN SOLN
RESPIRATORY_TRACT | Status: AC
  Filled 2017-10-23: qty 6

## 2017-10-23 NOTE — ED Triage Notes (Signed)
Pt arrives via ems from [facility], pt in resp distress receiving a duoneb, pt also given 125mg , 1 albuterol, 2 duonebs via ems. Ems reports pt started to c/o chest pain with difficulty breathing then passed out. Estimated syncope for approx a minute. Pt described sensation like a box was on her chest. Ems also reports that at their arrival her [place] with humidified O2 was full of condensation and spewing everywhere

## 2017-10-24 ENCOUNTER — Encounter: Payer: Self-pay | Admitting: Radiology

## 2017-10-24 ENCOUNTER — Other Ambulatory Visit: Payer: Self-pay

## 2017-10-24 ENCOUNTER — Inpatient Hospital Stay: Payer: Medicare (Managed Care)

## 2017-10-24 ENCOUNTER — Inpatient Hospital Stay
Admit: 2017-10-24 | Discharge: 2017-10-24 | Disposition: A | Discharge status: Home / Self Care | Payer: Medicare (Managed Care) | Attending: Internal Medicine | Admitting: Internal Medicine

## 2017-10-24 ENCOUNTER — Emergency Department: Payer: Medicare (Managed Care)

## 2017-10-24 DIAGNOSIS — I4510 Unspecified right bundle-branch block: Secondary | ICD-10-CM | POA: Diagnosis present

## 2017-10-24 DIAGNOSIS — Z6841 Body Mass Index (BMI) 40.0 and over, adult: Secondary | ICD-10-CM | POA: Diagnosis not present

## 2017-10-24 DIAGNOSIS — W19XXXD Unspecified fall, subsequent encounter: Secondary | ICD-10-CM | POA: Diagnosis present

## 2017-10-24 DIAGNOSIS — G4730 Sleep apnea, unspecified: Secondary | ICD-10-CM | POA: Diagnosis present

## 2017-10-24 DIAGNOSIS — J441 Chronic obstructive pulmonary disease with (acute) exacerbation: Secondary | ICD-10-CM | POA: Diagnosis present

## 2017-10-24 DIAGNOSIS — R000 Tachycardia, unspecified: Secondary | ICD-10-CM | POA: Diagnosis present

## 2017-10-24 DIAGNOSIS — R55 Syncope and collapse: Secondary | ICD-10-CM | POA: Diagnosis present

## 2017-10-24 DIAGNOSIS — E1140 Type 2 diabetes mellitus with diabetic neuropathy, unspecified: Secondary | ICD-10-CM | POA: Diagnosis present

## 2017-10-24 DIAGNOSIS — E785 Hyperlipidemia, unspecified: Secondary | ICD-10-CM | POA: Diagnosis present

## 2017-10-24 DIAGNOSIS — I10 Essential (primary) hypertension: Secondary | ICD-10-CM | POA: Diagnosis present

## 2017-10-24 DIAGNOSIS — F329 Major depressive disorder, single episode, unspecified: Secondary | ICD-10-CM | POA: Diagnosis present

## 2017-10-24 DIAGNOSIS — Z8249 Family history of ischemic heart disease and other diseases of the circulatory system: Secondary | ICD-10-CM | POA: Diagnosis not present

## 2017-10-24 DIAGNOSIS — M4854XD Collapsed vertebra, not elsewhere classified, thoracic region, subsequent encounter for fracture with routine healing: Secondary | ICD-10-CM | POA: Diagnosis present

## 2017-10-24 DIAGNOSIS — Z87891 Personal history of nicotine dependence: Secondary | ICD-10-CM | POA: Diagnosis not present

## 2017-10-24 DIAGNOSIS — S42302D Unspecified fracture of shaft of humerus, left arm, subsequent encounter for fracture with routine healing: Secondary | ICD-10-CM | POA: Diagnosis not present

## 2017-10-24 DIAGNOSIS — Z79899 Other long term (current) drug therapy: Secondary | ICD-10-CM | POA: Diagnosis not present

## 2017-10-24 DIAGNOSIS — R7881 Bacteremia: Secondary | ICD-10-CM | POA: Diagnosis present

## 2017-10-24 DIAGNOSIS — B9562 Methicillin resistant Staphylococcus aureus infection as the cause of diseases classified elsewhere: Secondary | ICD-10-CM | POA: Diagnosis present

## 2017-10-24 DIAGNOSIS — J9621 Acute and chronic respiratory failure with hypoxia: Secondary | ICD-10-CM | POA: Diagnosis present

## 2017-10-24 DIAGNOSIS — L8990 Pressure ulcer of unspecified site, unspecified stage: Secondary | ICD-10-CM | POA: Diagnosis present

## 2017-10-24 LAB — BLOOD CULTURE ID PANEL (REFLEXED)
ACINETOBACTER BAUMANNII: NOT DETECTED
CANDIDA ALBICANS: NOT DETECTED
CANDIDA PARAPSILOSIS: NOT DETECTED
Candida glabrata: NOT DETECTED
Candida krusei: NOT DETECTED
Candida tropicalis: NOT DETECTED
ENTEROBACTERIACEAE SPECIES: NOT DETECTED
ENTEROCOCCUS SPECIES: NOT DETECTED
Enterobacter cloacae complex: NOT DETECTED
Escherichia coli: NOT DETECTED
HAEMOPHILUS INFLUENZAE: NOT DETECTED
KLEBSIELLA OXYTOCA: NOT DETECTED
Klebsiella pneumoniae: NOT DETECTED
LISTERIA MONOCYTOGENES: NOT DETECTED
METHICILLIN RESISTANCE: DETECTED — AB
NEISSERIA MENINGITIDIS: NOT DETECTED
PSEUDOMONAS AERUGINOSA: NOT DETECTED
Proteus species: NOT DETECTED
STREPTOCOCCUS AGALACTIAE: NOT DETECTED
STREPTOCOCCUS PNEUMONIAE: NOT DETECTED
STREPTOCOCCUS PYOGENES: NOT DETECTED
STREPTOCOCCUS SPECIES: NOT DETECTED
Serratia marcescens: NOT DETECTED
Staphylococcus aureus (BCID): DETECTED — AB
Staphylococcus species: DETECTED — AB

## 2017-10-24 LAB — BASIC METABOLIC PANEL
Anion gap: 10 (ref 5–15)
BUN: 18 mg/dL (ref 6–20)
CALCIUM: 9 mg/dL (ref 8.9–10.3)
CO2: 30 mmol/L (ref 22–32)
CREATININE: 0.62 mg/dL (ref 0.44–1.00)
Chloride: 99 mmol/L — ABNORMAL LOW (ref 101–111)
GFR calc Af Amer: >60 mL/min (ref >60)
GLUCOSE: 173 mg/dL — AB (ref 65–99)
Potassium: 4.2 mmol/L (ref 3.5–5.1)
Sodium: 139 mmol/L (ref 135–145)

## 2017-10-24 LAB — CBC
HEMATOCRIT: 32.1 % — AB (ref 35.0–47.0)
Hemoglobin: 10.7 g/dL — ABNORMAL LOW (ref 12.0–16.0)
MCH: 31.4 pg (ref 26.0–34.0)
MCHC: 33.2 g/dL (ref 32.0–36.0)
MCV: 94.6 fL (ref 80.0–100.0)
PLATELETS: 224 10*3/uL (ref 150–440)
RBC: 3.39 MIL/uL — ABNORMAL LOW (ref 3.80–5.20)
RDW: 14.5 % (ref 11.5–14.5)
WBC: 8.6 10*3/uL (ref 3.6–11.0)

## 2017-10-24 LAB — TROPONIN I: Troponin I: <0.03 ng/mL (ref <0.03)

## 2017-10-24 LAB — LACTIC ACID, PLASMA
LACTIC ACID, VENOUS: 1.9 mmol/L (ref 0.5–1.9)
LACTIC ACID, VENOUS: 0.6 mmol/L (ref 0.5–1.9)

## 2017-10-24 LAB — GLUCOSE, CAPILLARY
Glucose-Capillary: 137 mg/dL — ABNORMAL HIGH (ref 65–99)
Glucose-Capillary: 190 mg/dL — ABNORMAL HIGH (ref 65–99)
Glucose-Capillary: 122 mg/dL — ABNORMAL HIGH (ref 65–99)

## 2017-10-24 LAB — MRSA PCR SCREENING: MRSA BY PCR: POSITIVE — AB

## 2017-10-24 MED ORDER — MAGNESIUM OXIDE 400 (241.3 MG) MG PO TABS
400.0000 mg | ORAL_TABLET | Freq: Every day | ORAL | Status: DC
  Administered 2017-10-24 – 2017-10-28 (×5): 400 mg via ORAL
  Filled 2017-10-24 (×5): qty 1

## 2017-10-24 MED ORDER — BUDESONIDE 0.5 MG/2ML IN SUSP
0.5000 mg | Freq: Two times a day (BID) | RESPIRATORY_TRACT | Status: DC
  Administered 2017-10-24 – 2017-10-28 (×8): 0.5 mg via RESPIRATORY_TRACT
  Filled 2017-10-24 (×8): qty 2

## 2017-10-24 MED ORDER — IOPAMIDOL (ISOVUE-370) INJECTION 76%
75.0000 mL | Freq: Once | INTRAVENOUS | Status: AC | PRN
  Administered 2017-10-24: 75 mL via INTRAVENOUS

## 2017-10-24 MED ORDER — SODIUM CHLORIDE 0.9% FLUSH
3.0000 mL | Freq: Two times a day (BID) | INTRAVENOUS | Status: DC
  Administered 2017-10-24 – 2017-10-27 (×8): 3 mL via INTRAVENOUS

## 2017-10-24 MED ORDER — PRIMIDONE 50 MG PO TABS
50.0000 mg | ORAL_TABLET | Freq: Two times a day (BID) | ORAL | Status: DC
  Administered 2017-10-24 – 2017-10-28 (×9): 50 mg via ORAL
  Filled 2017-10-24 (×10): qty 1

## 2017-10-24 MED ORDER — DEXTROSE 5 % IV SOLN: 500.0000 mg | INTRAVENOUS | Status: DC

## 2017-10-24 MED ORDER — PREGABALIN 50 MG PO CAPS
50.0000 mg | ORAL_CAPSULE | Freq: Every day | ORAL | Status: DC
  Administered 2017-10-24: 50 mg via ORAL
  Filled 2017-10-24: qty 1

## 2017-10-24 MED ORDER — HYDROCODONE-ACETAMINOPHEN 5-325 MG PO TABS
1.0000 | ORAL_TABLET | ORAL | Status: DC | PRN
  Administered 2017-10-27: 05:00:00 2 via ORAL
  Administered 2017-10-27: 21:00:00 1 via ORAL
  Administered 2017-10-28: 2 via ORAL
  Filled 2017-10-24: qty 1
  Filled 2017-10-24 (×2): qty 2

## 2017-10-24 MED ORDER — ENOXAPARIN SODIUM 40 MG/0.4ML ~~LOC~~ SOLN
40.0000 mg | SUBCUTANEOUS | Status: DC
  Administered 2017-10-24 – 2017-10-26 (×3): 40 mg via SUBCUTANEOUS
  Filled 2017-10-24 (×3): qty 0.4

## 2017-10-24 MED ORDER — METHYLPREDNISOLONE SODIUM SUCC 125 MG IJ SOLR
INTRAMUSCULAR | Status: AC
  Filled 2017-10-24: qty 2

## 2017-10-24 MED ORDER — VANCOMYCIN HCL 10 G IV SOLR
1500.0000 mg | Freq: Two times a day (BID) | INTRAVENOUS | Status: DC
  Administered 2017-10-25 – 2017-10-26 (×3): 1500 mg via INTRAVENOUS
  Filled 2017-10-24 (×5): qty 1500

## 2017-10-24 MED ORDER — SODIUM CHLORIDE 0.9 % IV BOLUS (SEPSIS)
1000.0000 mL | Freq: Once | INTRAVENOUS | Status: AC
  Administered 2017-10-24: 1000 mL via INTRAVENOUS

## 2017-10-24 MED ORDER — ALUM & MAG HYDROXIDE-SIMETH 200-200-20 MG/5ML PO SUSP: 15.0000 mL | Freq: Four times a day (QID) | ORAL | Status: DC | PRN

## 2017-10-24 MED ORDER — METHYLPREDNISOLONE SODIUM SUCC 40 MG IJ SOLR
40.0000 mg | Freq: Every day | INTRAMUSCULAR | Status: DC
  Administered 2017-10-25 – 2017-10-26 (×2): 40 mg via INTRAVENOUS
  Filled 2017-10-24 (×2): qty 1

## 2017-10-24 MED ORDER — VANCOMYCIN HCL IN DEXTROSE 1-5 GM/200ML-% IV SOLN
1000.0000 mg | Freq: Once | INTRAVENOUS | Status: AC
  Administered 2017-10-24: 1000 mg via INTRAVENOUS
  Filled 2017-10-24: qty 200

## 2017-10-24 MED ORDER — INSULIN ASPART 100 UNIT/ML ~~LOC~~ SOLN
0.0000 [IU] | Freq: Three times a day (TID) | SUBCUTANEOUS | Status: DC
  Administered 2017-10-24: 2 [IU] via SUBCUTANEOUS
  Administered 2017-10-24 (×2): 3 [IU] via SUBCUTANEOUS
  Administered 2017-10-25: 2 [IU] via SUBCUTANEOUS
  Administered 2017-10-26: 3 [IU] via SUBCUTANEOUS
  Administered 2017-10-27: 2 [IU] via SUBCUTANEOUS
  Administered 2017-10-27: 11 [IU] via SUBCUTANEOUS
  Filled 2017-10-24 (×7): qty 1

## 2017-10-24 MED ORDER — INSULIN ASPART 100 UNIT/ML ~~LOC~~ SOLN: 0.0000 [IU] | Freq: Every day | SUBCUTANEOUS | Status: DC

## 2017-10-24 MED ORDER — CEFTRIAXONE SODIUM IN DEXTROSE 20 MG/ML IV SOLN: 1.0000 g | INTRAVENOUS | Status: DC

## 2017-10-24 MED ORDER — SODIUM CHLORIDE 0.9% FLUSH: 3.0000 mL | INTRAVENOUS | Status: DC | PRN

## 2017-10-24 MED ORDER — SENNOSIDES-DOCUSATE SODIUM 8.6-50 MG PO TABS: 1.0000 | ORAL_TABLET | Freq: Every evening | ORAL | Status: DC | PRN

## 2017-10-24 MED ORDER — ACETAMINOPHEN 650 MG RE SUPP: 650.0000 mg | Freq: Four times a day (QID) | RECTAL | Status: DC | PRN

## 2017-10-24 MED ORDER — ACETAMINOPHEN 325 MG PO TABS: 650.0000 mg | ORAL_TABLET | Freq: Four times a day (QID) | ORAL | Status: DC | PRN

## 2017-10-24 MED ORDER — SODIUM CHLORIDE 0.9 % IV SOLN: 250.0000 mL | INTRAVENOUS | Status: DC | PRN

## 2017-10-24 MED ORDER — ONDANSETRON HCL 4 MG PO TABS: 4.0000 mg | ORAL_TABLET | Freq: Four times a day (QID) | ORAL | Status: DC | PRN

## 2017-10-24 MED ORDER — CYCLOBENZAPRINE HCL 10 MG PO TABS
5.0000 mg | ORAL_TABLET | Freq: Two times a day (BID) | ORAL | Status: DC | PRN
  Administered 2017-10-26 – 2017-10-27 (×4): 5 mg via ORAL
  Filled 2017-10-24 (×4): qty 1

## 2017-10-24 MED ORDER — SIMVASTATIN 20 MG PO TABS
40.0000 mg | ORAL_TABLET | Freq: Every day | ORAL | Status: DC
  Administered 2017-10-24 – 2017-10-27 (×4): 40 mg via ORAL
  Filled 2017-10-24 (×4): qty 2

## 2017-10-24 MED ORDER — FLUOXETINE HCL 20 MG PO CAPS
40.0000 mg | ORAL_CAPSULE | Freq: Every day | ORAL | Status: DC
  Administered 2017-10-24 – 2017-10-28 (×5): 40 mg via ORAL
  Filled 2017-10-24 (×5): qty 2

## 2017-10-24 MED ORDER — METHYLPREDNISOLONE SODIUM SUCC 125 MG IJ SOLR
125.0000 mg | Freq: Four times a day (QID) | INTRAMUSCULAR | Status: DC
  Administered 2017-10-24 (×2): 125 mg via INTRAVENOUS
  Filled 2017-10-24 (×2): qty 2

## 2017-10-24 MED ORDER — ONDANSETRON HCL 4 MG/2ML IJ SOLN: 4.0000 mg | Freq: Four times a day (QID) | INTRAMUSCULAR | Status: DC | PRN

## 2017-10-24 MED ORDER — AZITHROMYCIN 500 MG PO TABS
250.0000 mg | ORAL_TABLET | Freq: Every day | ORAL | Status: DC
  Administered 2017-10-24: 250 mg via ORAL
  Filled 2017-10-24: qty 1

## 2017-10-24 MED ORDER — DIAZEPAM 5 MG PO TABS
5.0000 mg | ORAL_TABLET | Freq: Four times a day (QID) | ORAL | Status: DC | PRN
  Administered 2017-10-25 – 2017-10-27 (×3): 5 mg via ORAL
  Filled 2017-10-24 (×3): qty 1

## 2017-10-24 MED ORDER — ENSURE ENLIVE PO LIQD
237.0000 mL | Freq: Two times a day (BID) | ORAL | Status: DC
  Administered 2017-10-26 – 2017-10-28 (×3): 237 mL via ORAL

## 2017-10-24 MED ORDER — GABAPENTIN 300 MG PO CAPS
300.0000 mg | ORAL_CAPSULE | Freq: Every day | ORAL | Status: DC
  Administered 2017-10-24 – 2017-10-27 (×4): 300 mg via ORAL
  Filled 2017-10-24 (×4): qty 1

## 2017-10-24 MED ORDER — HYDROXYZINE HCL 25 MG PO TABS
25.0000 mg | ORAL_TABLET | Freq: Two times a day (BID) | ORAL | Status: DC | PRN
  Filled 2017-10-24: qty 1

## 2017-10-24 MED ORDER — MONTELUKAST SODIUM 10 MG PO TABS
10.0000 mg | ORAL_TABLET | Freq: Every day | ORAL | Status: DC
  Administered 2017-10-24 – 2017-10-27 (×4): 10 mg via ORAL
  Filled 2017-10-24 (×4): qty 1

## 2017-10-24 MED ORDER — BUPROPION HCL ER (XL) 150 MG PO TB24
150.0000 mg | ORAL_TABLET | Freq: Every day | ORAL | Status: DC
  Administered 2017-10-24: 150 mg via ORAL
  Filled 2017-10-24: qty 1

## 2017-10-24 MED ORDER — VITAMIN D 1000 UNITS PO TABS
1000.0000 [IU] | ORAL_TABLET | Freq: Every day | ORAL | Status: DC
  Administered 2017-10-24 – 2017-10-28 (×5): 1000 [IU] via ORAL
  Filled 2017-10-24 (×5): qty 1

## 2017-10-24 MED ORDER — IPRATROPIUM-ALBUTEROL 0.5-2.5 (3) MG/3ML IN SOLN
3.0000 mL | Freq: Four times a day (QID) | RESPIRATORY_TRACT | Status: DC
  Administered 2017-10-24 – 2017-10-27 (×15): 3 mL via RESPIRATORY_TRACT
  Filled 2017-10-24 (×15): qty 3

## 2017-10-24 MED ORDER — FLUTICASONE PROPIONATE 50 MCG/ACT NA SUSP
2.0000 | Freq: Every day | NASAL | Status: DC
  Administered 2017-10-24 – 2017-10-28 (×5): 2 via NASAL
  Filled 2017-10-24 (×2): qty 16

## 2017-10-24 MED ORDER — ADULT MULTIVITAMIN W/MINERALS CH
1.0000 | ORAL_TABLET | Freq: Every day | ORAL | Status: DC
  Administered 2017-10-25 – 2017-10-28 (×4): 1 via ORAL
  Filled 2017-10-24 (×4): qty 1

## 2017-10-24 NOTE — Progress Notes (Signed)
Patient ID: Sara Pierce, female   DOB: July 09, 1941, 76 y.o.   MRN: [id number]  [facility] PROGRESS NOTE  Sara Pierce:[id number] DOB: 1941-11-05 DOA: 10/23/2017 PCP: System, Provider Not In  HPI/Subjective: Patient feels a little bit better with regards to her breathing.  Still has some audible wheezing.  Some cough but nonproductive.  Objective: Vitals:   10/24/17 0521 10/24/17 1209  BP: (!) 102/42 120/62  Pulse: 88 86  Resp: 19 18  Temp: (!) 97.5 F (36.4 C) 98.4 F (36.9 C)  SpO2: 96% 98%    Filed Weights   10/23/17 2247 10/24/17 0521  Weight: 112.6 kg (248 lb 3.8 oz) 108.9 kg (240 lb)    ROS: Review of Systems  Constitutional: Negative for chills and fever.  Eyes: Negative for blurred vision.  Respiratory: Positive for cough and wheezing. Negative for shortness of breath.   Cardiovascular: Negative for chest pain.  Gastrointestinal: Negative for abdominal pain, constipation, diarrhea, nausea and vomiting.  Genitourinary: Negative for dysuria.  Musculoskeletal: Positive for back pain and joint pain.  Neurological: Positive for weakness. Negative for dizziness and headaches.   Exam: Physical Exam  Constitutional: She is oriented to person, place, and time.  HENT:  Nose: No mucosal edema.  Mouth/Throat: No oropharyngeal exudate or posterior oropharyngeal edema.  Eyes: Conjunctivae, EOM and lids are normal. Pupils are equal, round, and reactive to light.  Neck: No JVD present. Carotid bruit is not present. No edema present. No thyroid mass and no thyromegaly present.  Cardiovascular: S1 normal and S2 normal. Exam reveals no gallop.  No murmur heard. Pulses:      Dorsalis pedis pulses are 2+ on the right side, and 2+ on the left side.  Respiratory: No respiratory distress. She has decreased breath sounds in the right lower field and the left lower field. She has wheezes in the right middle field and the left middle field. She has no rhonchi. She has no rales.   GI: Soft. Bowel sounds are normal. There is no tenderness.  Musculoskeletal:       Right ankle: She exhibits swelling.       Left ankle: She exhibits swelling.  Lymphadenopathy:    She has no cervical adenopathy.  Neurological: She is alert and oriented to person, place, and time. No cranial nerve deficit.  Skin: Skin is warm and dry. No rash noted. Nails show no clubbing.  Psychiatric: She has a normal mood and affect.      Data Reviewed: Basic Metabolic Panel: Recent Labs  Lab 10/23/17 2256 10/24/17 0533  NA 140 139  K 4.8 4.2  CL 97* 99*  CO2 32 30  GLUCOSE 180* 173*  BUN 20 18  CREATININE 0.67 0.62  CALCIUM 9.4 9.0   Liver Function Tests: Recent Labs  Lab 10/23/17 2256  AST 39  ALT 27  ALKPHOS 74  BILITOT 0.5  PROT 6.7  ALBUMIN 3.6   CBC: Recent Labs  Lab 10/23/17 2256 10/24/17 0533  WBC 9.4 8.6  NEUTROABS 6.6*  --   HGB 12.2 10.7*  HCT 36.9 32.1*  MCV 95.0 94.6  PLT 304 224   Cardiac Enzymes: Recent Labs  Lab 10/23/17 2256 10/24/17 0533 10/24/17 1105  TROPONINI <0.03 <0.03 <0.03   BNP (last 3 results) Recent Labs    06/15/17 1133 09/11/17 1433 10/23/17 2256  BNP 159.0* 113.0* 41.0     CBG: Recent Labs  Lab 10/24/17 0932  GLUCAP 137*    Recent Results (from the  past 240 hour(s))  Blood Culture (routine x 2)     Status: None (Preliminary result)   Collection Time: 10/23/17 10:56 PM  Result Value Ref Range Status   Specimen Description BLOOD RIGHT HAND  Final   Special Requests   Final    BOTTLES DRAWN AEROBIC AND ANAEROBIC Blood Culture adequate volume   Culture NO GROWTH < 12 HOURS  Final   Report Status PENDING  Incomplete  Blood Culture (routine x 2)     Status: None (Preliminary result)   Collection Time: 10/23/17 10:57 PM  Result Value Ref Range Status   Specimen Description BLOOD RIGHT WRIST  Final   Special Requests   Final    BOTTLES DRAWN AEROBIC AND ANAEROBIC Blood Culture adequate volume   Culture NO GROWTH <  12 HOURS  Final   Report Status PENDING  Incomplete  MRSA PCR Screening     Status: Abnormal   Collection Time: 10/24/17  5:18 AM  Result Value Ref Range Status   MRSA by PCR POSITIVE (A) NEGATIVE Final    Comment:        The GeneXpert MRSA Assay (FDA approved for NASAL specimens only), is one component of a comprehensive MRSA colonization surveillance program. It is not intended to diagnose MRSA infection nor to guide or monitor treatment for MRSA infections. RESULT CALLED TO, READ BACK BY AND VERIFIED WITHDorna Bloom @ 3235 11/02/17 [place]      Studies: Ct Angio Chest Pe W And/or Wo Contrast  Result Date: 10/24/2017 CLINICAL DATA:  Acute onset of generalized chest pain and difficulty breathing. Syncope. EXAM: CT ANGIOGRAPHY CHEST WITH CONTRAST TECHNIQUE: Multidetector CT imaging of the chest was performed using the standard protocol during bolus administration of intravenous contrast. Multiplanar CT image reconstructions and MIPs were obtained to evaluate the vascular anatomy. CONTRAST:  28mL ISOVUE-370 IOPAMIDOL (ISOVUE-370) INJECTION 76% COMPARISON:  Chest radiograph performed 10/23/2017, and CTA of the chest performed 09/11/2017 FINDINGS: Cardiovascular: There is no definite evidence of pulmonary embolus. Evaluation for pulmonary embolus is mildly suboptimal due to low beam hardening artifact. Scattered coronary artery calcifications are seen. Scattered calcification is noted along the aortic arch and great vessels. The heart is normal in size. Mediastinum/Nodes: The mediastinum is otherwise unremarkable in appearance. No mediastinal lymphadenopathy is seen. No pericardial effusion is identified. The thyroid gland is unremarkable. No axillary lymphadenopathy is seen. Lungs/Pleura: Bilateral dependent subsegmental atelectasis is noted. No pleural effusion or pneumothorax is seen. No masses are identified. Upper Abdomen: The visualized portions of the liver and spleen are unremarkable.  There is mild nodularity at the left adrenal gland, without a well-defined mass. The visualized portions of the kidneys are grossly unremarkable. Musculoskeletal: No acute osseous abnormalities are identified. There is chronic loss of height at vertebral body T6. The visualized musculature is unremarkable in appearance. There is healed chronic deformity of the proximal left humerus. A right humeral head prosthesis is partially imaged. Review of the MIP images confirms the above findings. IMPRESSION: 1. No definite evidence of pulmonary embolus. 2. Bilateral fat sulcal atelectasis noted.  Lungs otherwise clear. 3. Scattered coronary artery calcifications. 4. Chronic loss of height at vertebral body T6. Electronically Signed   By: Garald Balding M.D.   On: 10/24/2017 03:19   US Carotid Bilateral  Result Date: 10/24/2017 CLINICAL DATA:  Syncope EXAM: BILATERAL CAROTID DUPLEX ULTRASOUND TECHNIQUE: Pearline Cables scale imaging, color Doppler and duplex ultrasound were performed of bilateral carotid and vertebral arteries in the neck. COMPARISON:  None. FINDINGS:  Criteria: Quantification of carotid stenosis is based on velocity parameters that correlate the residual internal carotid diameter with NASCET-based stenosis levels, using the diameter of the distal internal carotid lumen as the denominator for stenosis measurement. The following velocity measurements were obtained: RIGHT ICA:  96 cm/sec CCA:  948 cm/sec SYSTOLIC ICA/CCA RATIO:  1.0 DIASTOLIC ICA/CCA RATIO:  1.7 ECA:  218 cm/sec LEFT ICA:  135 cm/sec CCA:  016 cm/sec SYSTOLIC ICA/CCA RATIO:  1.2 DIASTOLIC ICA/CCA RATIO:  1.9 ECA:  267 cm/sec RIGHT CAROTID ARTERY: Scattered calcified plaque in the common carotid artery. Moderate calcified plaque in the bulb. There is more prominent calcified plaque in the proximal external carotid artery. Low resistance internal carotid Doppler pattern is preserved. RIGHT VERTEBRAL ARTERY:  Antegrade. LEFT CAROTID ARTERY: There is  moderate focal calcified plaque in the left bulb. Low resistance internal carotid Doppler pattern is preserved. LEFT VERTEBRAL ARTERY:  Antegrade. IMPRESSION: Less than 50% stenosis in the right internal carotid artery. 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 Electronically Signed   By: Marybelle Killings M.D.   On: 10/24/2017 10:28   Dg Chest Port 1 View  Result Date: 10/24/2017 CLINICAL DATA:  76 y/o  F; acute onset of shortness of breath. EXAM: PORTABLE CHEST 1 VIEW COMPARISON:  09/11/2017 chest radiograph. FINDINGS: Normal cardiac silhouette. Aortic atherosclerosis with calcification. Bibasilar bronchitic changes. No focal consolidation. Possible small left effusion. No pneumothorax. Multilevel degenerative changes of the spine. Chronic left proximal humerus fracture and right shoulder arthroplasty partially visualized. IMPRESSION: Bronchitic changes in lung bases. Stable cardiac silhouette. Aortic atherosclerosis. Possible small left effusion. Electronically Signed   By: Kristine Garbe M.D.   On: 10/24/2017 00:15    Scheduled Meds: . azithromycin  250 mg Oral q1800  . buPROPion  150 mg Oral Daily  . cholecalciferol  1,000 Units Oral Daily  . enoxaparin (LOVENOX) injection  40 mg Subcutaneous Q24H  . FLUoxetine  40 mg Oral Daily  . fluticasone  2 spray Each Nare Daily  . gabapentin  300 mg Oral QHS  . insulin aspart  0-15 Units Subcutaneous TID WC  . insulin aspart  0-5 Units Subcutaneous QHS  . ipratropium-albuterol  3 mL Nebulization Q6H  . magnesium oxide  400 mg Oral Daily  . [START ON 10/25/2017] methylPREDNISolone (SOLU-MEDROL) injection  40 mg Intravenous Daily  . montelukast  10 mg Oral QHS  . pregabalin  50 mg Oral Daily  . primidone  50 mg Oral BID  . simvastatin  40 mg Oral QHS  . sodium chloride flush  3 mL Intravenous Q12H   Continuous Infusions: . sodium chloride      Assessment/Plan:  1. Acute on chronic respiratory failure.  Patient required BiPAP  on presentation to the ER.  Now back on 3 L nasal cannula 2. COPD exacerbation.  Drop Solu-Medrol dose down to 40 mg IV daily.  Discontinue Rocephin.  Continue Zithromax.  Add budesonide nebulizer. 3. Patient has an unstable compression fracture of the thoracic spine.  Back brace to be sent in from [facility] 4. Sleep apnea on oxygen at night 5. Diabetic neuropathy on gabapentin 6. Type 2 diabetes on sliding scale 7. Hyperlipidemia unspecified on simvastatin 8. Depression on Wellbutrin and fluoxetine  Spoke with pharmacist to go over medications and she will potentially get rid of a few medications that the patient may or may not be taking.  Code Status:     Code Status Orders  (From admission, onward)  Start     Ordered   10/24/17 0523  Full code  Continuous     10/24/17 0523    Code Status History    Date Active Date Inactive Code Status Order ID Comments User Context   08/20/2017 01:34 08/21/2017 18:28 Full Code [id number]  Lance Coon, MD Inpatient   10/03/2016 05:40 10/04/2016 17:11 Full Code [id number]  Harrie Foreman, MD Inpatient   11/15/2015 22:23 11/19/2015 15:14 Full Code [id number]  Hower, Aaron Mose, MD ED    Advance Directive Documentation     Most Recent Value  Type of Advance Directive  Healthcare Power of Attorney  Pre-existing out of facility DNR order (yellow form or pink MOST form)  No data  "MOST" Form in Place?  No data     Family Communication: Husband at the bedside disposition Plan: Potentially back to [facility] tomorrow.  The husband is concerned that he has to have supervised visits.  Patient and the husband stated that the patient's son had made a complaint about him.  They feel that this is untrue.  The patient feels that she needs her husband more now than ever.  I left a message for Dr. Ovid Curd this afternoon to inform her about the situation  Antibiotics:  Zithromax  Time spent: 35 minutes  Loletha Grayer  [employer]

## 2017-10-24 NOTE — Progress Notes (Signed)
Black back brace brought from [facility] by [employer]. Pt refuses to put brace on at this time, will try again in a little while.

## 2017-10-24 NOTE — ED Provider Notes (Signed)
-----------------------------------------   3:51 AM on 10/24/2017 -----------------------------------------   Blood pressure 119/64, pulse 99, temperature 98.4 F (36.9 C), temperature source Oral, resp. rate 14, weight 112.6 kg (248 lb 3.8 oz), SpO2 91 %.  Assuming care from Dr. Cherylann Banas.  In short, Sara Pierce is a 76 y.o. female with a chief complaint of No chief complaint on file. Marland Kitchen  Refer to the original H&P for additional details.  The current plan of care is to follow up the results of the CT angio and admit the patient.     The patient's CT scan shows no definite evidence of pulmonary embolus. She does have some atelectasis noted but her lungs are otherwise clear. After being on BiPAP for multiple hours the patient was able to be weaned to nasal cannula. The patient O2 sats was still about 90-91% and she does still have some rhonchi on exam but her breathing is improved. The patient will be admitted to the hospitalist service.   Loney Hering, MD 10/24/17 [id number]

## 2017-10-24 NOTE — ED Notes (Signed)
Pt's breathing much more relaxed and no distress noted, pt placed on 3L via [place], resp made aware, pt tol O2 via [place] well at this time

## 2017-10-24 NOTE — ED Provider Notes (Addendum)
[facility] Emergency Department Provider Note ____________________________________________   First MD Initiated Contact with Patient 10/23/17 2245     (approximate)  I have reviewed the triage vital signs and the nursing notes.   HISTORY  Chief Complaint No chief complaint on file.  History of present illness severely limited by acute respiratory distress.  HPI [name] is a 76 y.o. female with history of COPD, and other past medical history as noted below who presents from a rehab facility for shortness of breath.  The shortness of breath probably worsen over the last several hours and associated with cough and chest pain.  Per EMS, the patient reported she had chest pain, and then suddenly became flushed, lost consciousness and became more hypoxic despite supplemental O2.  At the time of ED I will, patient was alert and oxygenating.     Past Medical History:  Diagnosis Date  . Arthritis   . Cardiomyopathy ([place])   . Cataract   . Cerebral hemorrhage ([place])   . Chronic kidney disease   . Clotting disorder ([place])   . COPD (chronic obstructive pulmonary disease) ([place])   . Depression   . Diabetes mellitus without complication ([place])   . Hypertension   . Mixed incontinence   . Obesity   . Sleep apnea   . Thyroid disease   . Urinary frequency   . Vaginal atrophy   . Varicose veins     Patient Active Problem List   Diagnosis Date Noted  . HCAP (healthcare-associated pneumonia) 08/19/2017  . CAP (community acquired pneumonia) 10/03/2016  . Sepsis ([place]) 11/15/2015  . Absolute anemia 03/15/2015  . Anxiety 03/15/2015  . Arthritis 03/15/2015  . Cardiomyopathy, secondary ([place]) 03/15/2015  . Cataract 03/15/2015  . Brain bleed ([place]) 03/15/2015  . Chronic constipation 03/15/2015  . Chronic kidney failure 03/15/2015  . Blood clotting disorder ([place]) 03/15/2015  . CAFL (chronic airflow limitation) ([place]) 03/15/2015  . Clinical depression 03/15/2015    . Diabetes ([place]) 03/15/2015  . H/O varicella 03/15/2015  . BP (high blood pressure) 03/15/2015  . Mixed incontinence 03/15/2015  . 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 Adiposity 03/15/2015  . Apnea, sleep 03/15/2015  . Disease of thyroid gland 03/15/2015  . FOM (frequency of micturition) 03/15/2015  . 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 Candida vaginitis 03/15/2015  . Abnormal ECG 03/15/2015    Past Surgical History:  Procedure Laterality Date  . APPENDECTOMY    . Cardiac Catherization    . JOINT REPLACEMENT    . STRABISMUS SURGERY    . TONSILLECTOMY    . TUBAL LIGATION      Prior to Admission medications   Medication Sig Start Date End Date Taking? Authorizing Provider  fluticasone (FLONASE) 50 MCG/ACT nasal spray Place 2 sprays into both nostrils daily.    Yes [provider]  nystatin cream (MYCOSTATIN) Apply 1 application topically 2 (two) times daily.   Yes [provider]  predniSONE (DELTASONE) 10 MG tablet Take 20 mg once by mouth. One time dose   Yes [provider]  Vitamin D, Ergocalciferol, (DRISDOL) 50000 units CAPS capsule Take 50,000 Units every 7 (seven) days by mouth.   Yes [provider]  acetaminophen (TYLENOL) 650 MG CR tablet Take 650 mg by mouth every 6 (six) hours.     [provider]  albuterol (PROVENTIL HFA;VENTOLIN HFA) 108 (90 Base) MCG/ACT inhaler Inhale 2 puffs into the lungs every 6 (six)  hours as needed for wheezing or shortness of breath. Patient taking differently: Inhale 2 puffs into the lungs every 4 (four) hours as needed for wheezing or shortness of breath.  06/15/17   Rudene Re, MD  albuterol (PROVENTIL) (2.5 MG/3ML) 0.083% nebulizer solution Take 2.5 mg by nebulization every 4 (four) hours as needed for wheezing or shortness of breath.    [provider]  alum & mag hydroxide-simeth (MAALOX/MYLANTA) 200-200-20 MG/5ML suspension Take 15 mLs by  mouth every 6 (six) hours as needed for indigestion or heartburn.    [provider]  buPROPion (WELLBUTRIN XL) 150 MG 24 hr tablet Take 150 mg by mouth daily.    [provider]  cyclobenzaprine (FLEXERIL) 5 MG tablet Take 5 mg by mouth 2 (two) times daily as needed for muscle spasms.    [provider]  diphenhydrAMINE (BENADRYL) 25 MG tablet Take 25 mg by mouth daily as needed (burning/ flushing).    [provider]  docusate sodium (COLACE) 100 MG capsule Take 1 capsule (100 mg total) by mouth 2 (two) times daily as needed for mild constipation. Patient not taking: Reported on 09/11/2017 10/04/16   Nicholes Mango, MD  FLUoxetine (PROZAC) 40 MG capsule Take 40 mg by mouth daily.    [provider]  furosemide (LASIX) 40 MG tablet Take 40 mg by mouth daily.     [provider]  guaiFENesin-dextromethorphan (ROBITUSSIN DM) 100-10 MG/5ML syrup Take 10 mLs by mouth every 6 (six) hours as needed for cough. Patient taking differently: Take 10 mLs by mouth every 4 (four) hours as needed for cough.  10/04/16   Nicholes Mango, MD  hydrOXYzine (ATARAX/VISTARIL) 25 MG tablet Take 25 mg by mouth 2 (two) times daily as needed for anxiety.    [provider]  ipratropium-albuterol (DUONEB) 0.5-2.5 (3) MG/3ML SOLN Inhale 3 mLs into the lungs every 6 (six) hours as needed (wheezing).     [provider]  levofloxacin (LEVAQUIN) 750 MG tablet Take 750 mg by mouth daily.    [provider]  liothyronine (CYTOMEL) 25 MCG tablet Take 25 mcg by mouth daily.     [provider]  magnesium oxide (MAG-OX) 400 MG tablet Take 400 mg by mouth daily.     [provider]  montelukast (SINGULAIR) 10 MG tablet Take 10 mg by mouth at bedtime.     [provider]  oxyCODONE (OXY IR/ROXICODONE) 5 MG immediate release tablet Take 5 mg by mouth 3 (three) times daily.     [provider]  polyethylene glycol (MIRALAX /  GLYCOLAX) packet Take 17 g by mouth daily.    [provider]  potassium chloride (K-DUR) 10 MEQ tablet Take 10 mEq by mouth daily.     [provider]  pregabalin (LYRICA) 50 MG capsule Take 50 mg by mouth daily.    [provider]  primidone (MYSOLINE) 50 MG tablet Take 50 mg by mouth 2 (two) times daily.     [provider]  senna-docusate (SENOKOT-S) 8.6-50 MG tablet Take 2 tablets by mouth daily.    [provider]  simvastatin (ZOCOR) 40 MG tablet Take 40 mg by mouth at bedtime.     [provider]  sodium chloride (OCEAN) 0.65 % SOLN nasal spray Place 2 sprays into both nostrils 3 (three) times daily as needed for congestion. 10/04/16   Nicholes Mango, MD  spironolactone (ALDACTONE) 25 MG tablet Take 1 tablet (25 mg total) by mouth daily.  11/19/15   Gladstone Lighter, MD  tiZANidine (ZANAFLEX) 4 MG tablet Take 4 mg by mouth every 12 (twelve) hours as needed for muscle spasms.    [provider]  traMADol (ULTRAM) 50 MG tablet Take 50 mg by mouth every 6 (six) hours as needed for moderate pain.    [provider]  umeclidinium-vilanterol (ANORO ELLIPTA) 62.5-25 MCG/INH AEPB Inhale 1 puff into the lungs daily.    [provider]    Allergies Aspirin; Codeine; Naprosyn [naproxen]; Other; Tape; and Valacyclovir  Family History  Problem Relation Age of Onset  . Heart attack Father   . Coronary artery disease Father   . Hypertension Father   . Hyperlipidemia Father   . Heart attack Mother   . Hypertension Mother   . Hyperlipidemia Mother   . Breast cancer Neg Hx     Social History Social History   Tobacco Use  . Smoking status: Former Smoker    Packs/day: 0.50    Years: 44.00    Pack years: 22.00    Types: Cigarettes  . Smokeless tobacco: Never Used  Substance Use Topics  . Alcohol use: No  . Drug use: No    Review of Systems Level V caveat: Unable to obtain review of systems due to acute  respiratory distress.    ____________________________________________   PHYSICAL EXAM:  VITAL SIGNS: ED Triage Vitals  Enc Vitals Group     BP 10/23/17 2246 122/63     Pulse Rate 10/23/17 2246 (!) 113     Resp 10/23/17 2246 20     Temp 10/23/17 2246 98.4 F (36.9 C)     Temp Source 10/23/17 2246 Oral     SpO2 10/23/17 2246 97 %     Weight 10/23/17 2247 248 lb 3.8 oz (112.6 kg)     Height --      Head Circumference --      Peak Flow --      Pain Score 10/23/17 2245 9     Pain Loc --      Pain Edu? --      Excl. in [place]? --     Constitutional: Alert, tired appearing.  Moderate respiratory distress.  Eyes: Conjunctivae are normal.  EOMI.   Head: Atraumatic. Nose: No congestion/rhinnorhea. Mouth/Throat: Mucous membranes are dry.   Neck: Normal range of motion.  Cardiovascular: Tachycardic, regular rhythm. Grossly normal heart sounds.  Good peripheral circulation. Respiratory: Moderate respiratory distress.  Bilateral wheeze, ronchi and dec breath sounds. Gastrointestinal: Soft and nontender. No distention.  Genitourinary: No flank tenderness. Musculoskeletal: No lower extremity edema.  Extremities warm and well perfused.  Neurologic:  Normal speech and language. Motor intact in all extremities.  No gross focal neurologic deficits are appreciated.  Skin:  Skin is warm and dry. No rash noted. Psychiatric: Mood and affect are normal. Speech and behavior are normal.  ____________________________________________   LABS (all labs ordered are listed, but only abnormal results are displayed)  Labs Reviewed  BLOOD GAS, VENOUS - Abnormal; Notable for the following components:      Result Value   pCO2, Ven 61 (*)    pO2, Ven 68.0 (*)    Bicarbonate 35.3 (*)    Acid-Base Excess 8.2 (*)    All other components within normal limits  LACTIC ACID, PLASMA - Abnormal; Notable for the following components:   Lactic Acid, Venous 2.0 (*)    All other components within normal limits    COMPREHENSIVE METABOLIC PANEL - Abnormal;  Notable for the following components:   Chloride 97 (*)    Glucose, Bld 180 (*)    All other components within normal limits  CBC WITH DIFFERENTIAL/PLATELET - Abnormal; Notable for the following components:   RDW 14.6 (*)    Neutro Abs 6.6 (*)    All other components within normal limits  CULTURE, BLOOD (ROUTINE X 2)  CULTURE, BLOOD (ROUTINE X 2)  BRAIN NATRIURETIC PEPTIDE  TROPONIN I  PROTIME-INR  LACTIC ACID, PLASMA  URINALYSIS, ROUTINE W REFLEX MICROSCOPIC   ____________________________________________  EKG  ED ECG REPORT I, Arta Silence, the attending physician, personally viewed and interpreted this ECG.  Date: 10/24/2017 EKG Time: 2241 Rate: 110 Rhythm: Sinus tachycardia QRS Axis: normal Intervals: Right bundle branch block ST/T Wave abnormalities: Nonspecific Narrative Interpretation: no evidence of acute ischemia; no significant change when compared with EKG from 08/19/2017  ____________________________________________  RADIOLOGY  CXR: pending  ____________________________________________   PROCEDURES  Procedure(s) performed: No    Critical Care performed: Yes  CRITICAL CARE Performed by: Arta Silence   Total critical care time: 35 minutes  Critical care time was exclusive of separately billable procedures and treating other patients.  Critical care was necessary to treat or prevent imminent or life-threatening deterioration.  Critical care was time spent personally by me on the following activities: development of treatment plan with patient and/or surrogate as well as nursing, discussions with consultants, evaluation of patient's response to treatment, examination of patient, obtaining history from patient or surrogate, ordering and performing treatments and interventions, ordering and review of laboratory studies, ordering and review of radiographic studies, pulse oximetry and re-evaluation of  patient's condition. ____________________________________________   INITIAL IMPRESSION / ASSESSMENT AND PLAN / ED COURSE  Pertinent labs & imaging results that were available during my care of the patient were reviewed by me and considered in my medical decision making (see chart for details).  76 year old female with history of COPD, other past medical history as noted above, and recent apparent left arm surgery and a splint presents with acutely worsening shortness of breath over the last several hours.  Per EMS, patient became flushed and syncopized and then became more hypoxic.  Nebs and steroid given by EMS.   On ED arrival, patient in moderate respiratory distress, with O2 in the low 90s on nonrebreather.  She was alert and answering questions appropriately.  Exam revealed tachycardia, bilateral significant wheeze and rhonchi, but no evidence of peripheral edema.  Review of past medical records in Epic reveals admission for pneumonia in September of this year.  Differential includes COPD exacerbation, pneumonia, less likely CHF.  Also consider PE given the recent surgery, patient's hypoxia, and chest pain.  Less likely ACS.  Patient placed on BiPAP for hypoxia and work of breathing; will wean off when able.  Plan for CXR, labs, sepsis workup, CT chest to r/o PE unless significiant infiltrate on CXR, and reassess.   ----------------------------------------- 12:13 AM on 10/24/2017 -----------------------------------------  Patient reports significant improved symptoms after nebs, IV mag and BiPAP.  O2 sat still borderline low so we will obtain a CT chest to rule out PE.  Lactate only minimally elevated and patient has no fever or elevated WBC count so will hold of on empiric abx for now, but give abx if infiltrate on CXR or CT.  Plan for admission.  Signed out to oncoming physician Dr. Dahlia Client.      ____________________________________________   FINAL CLINICAL IMPRESSION(S) / ED  DIAGNOSES  Final diagnoses:  Respiratory  distress      NEW MEDICATIONS STARTED DURING THIS VISIT:  This SmartLink is deprecated. Use AVSMEDLIST instead to display the medication list for a patient.   Note:  This document was prepared using Dragon voice recognition software and may include unintentional dictation errors.    Arta Silence, MD 10/24/17 6825    Arta Silence, MD 10/24/17 [phone]

## 2017-10-24 NOTE — ED Notes (Signed)
Pt placed on bedpan in attempt to obtain a urine sample, unable to obtain urine sample through in and out cath x2, dr siadecki aware and asked this RN to place pt on bedpan

## 2017-10-24 NOTE — ED Notes (Signed)
Pt back from CT

## 2017-10-24 NOTE — ED Notes (Signed)
Husband at bedside; pt on 3.5L oxygen via [place]; sats 92-93%; understand waiting for CT results for admission; husband and pt discussed issues they're having at [facility]; husband says pt had appt with neurologist and pulmonologist that was cancelled without her knowledge or discussion with her; husband says he was told the neurologist appt was not necessary; pt and husband would really like her to be placed somewhere else when she is discharged if that is possible

## 2017-10-24 NOTE — Progress Notes (Signed)
Pharmacy Antibiotic Note  Sara Pierce is a 76 y.o. female admitted on 10/23/2017 with bacteremia.  Pharmacy has been consulted for vancomycin dosing.  Plan: Vancomycin 1500mg   IV every 12 hours.  Goal trough 15-20 mcg/mL.  Height: 5\' 5"  (165.1 cm) Weight: 241 lb 11.2 oz (109.6 kg)(bed scale) IBW/kg (Calculated) : 57  Temp (24hrs), Avg:98.1 F (36.7 C), Min:97.5 F (36.4 C), Max:98.4 F (36.9 C)  Recent Labs  Lab 10/23/17 2256 10/24/17 0533 10/24/17 0745  WBC 9.4 8.6  --   CREATININE 0.67 0.62  --   LATICACIDVEN 2.0* 1.9 0.6    Estimated Creatinine Clearance: 76 mL/min (by C-G formula based on SCr of 0.62 mg/dL).    Allergies  Allergen Reactions  . Aspirin Swelling  . Codeine Itching  . Naprosyn [Naproxen] Other (See Comments)    Per MAR pt allergic  . Other     Elastic Bandages/ Supports Per MAR  . Tape Hives  . Valacyclovir     Antimicrobials this admission: Anti-infectives (From admission, onward)   Start     Dose/Rate Route Frequency Ordered Stop   10/25/17 0400  vancomycin (VANCOCIN) 1,500 mg in sodium chloride 0.9 % 500 mL IVPB     1,500 mg 250 mL/hr over 120 Minutes Intravenous Every 12 hours 10/24/17 1916     10/24/17 1900  vancomycin (VANCOCIN) IVPB 1000 mg/200 mL premix     1,000 mg 200 mL/hr over 60 Minutes Intravenous  Once 10/24/17 1846     10/24/17 1600  azithromycin (ZITHROMAX) tablet 250 mg  Status:  Discontinued     250 mg Oral Daily-1800 10/24/17 1443 10/24/17 1845   10/24/17 0430  cefTRIAXone (ROCEPHIN) 1 g in dextrose 5 % 50 mL IVPB - Premix  Status:  Discontinued     1 g 100 mL/hr over 30 Minutes Intravenous Every 24 hours 10/24/17 0413 10/24/17 1443   10/24/17 0430  azithromycin (ZITHROMAX) 500 mg in dextrose 5 % 250 mL IVPB  Status:  Discontinued     500 mg 250 mL/hr over 60 Minutes Intravenous Every 24 hours 10/24/17 0413 10/24/17 1443       Microbiology results: Recent Results (from the past 240 hour(s))  Blood Culture (routine  x 2)     Status: None (Preliminary result)   Collection Time: 10/23/17 10:56 PM  Result Value Ref Range Status   Specimen Description BLOOD RIGHT HAND  Final   Special Requests   Final    BOTTLES DRAWN AEROBIC AND ANAEROBIC Blood Culture adequate volume   Culture  Setup Time   Final    Organism ID to follow AEROBIC BOTTLE ONLY GRAM POSITIVE COCCI CRITICAL RESULT CALLED TO, READ BACK BY AND VERIFIED WITH: CHRISTINE KATSOUDAS AT 0938 ON 10/24/2017 JJB    Culture GRAM POSITIVE COCCI  Final   Report Status PENDING  Incomplete  Blood Culture ID Panel (Reflexed)     Status: Abnormal   Collection Time: 10/23/17 10:56 PM  Result Value Ref Range Status   Enterococcus species NOT DETECTED NOT DETECTED Final   Listeria monocytogenes NOT DETECTED NOT DETECTED Final   Staphylococcus species DETECTED (A) NOT DETECTED Final    Comment: CRITICAL RESULT CALLED TO, READ BACK BY AND VERIFIED WITH: Stephens AT 1829 ON 10/24/2017 JJB    Staphylococcus aureus DETECTED (A) NOT DETECTED Final    Comment: Methicillin (oxacillin)-resistant Staphylococcus aureus (MRSA). MRSA is predictably resistant to beta-lactam antibiotics (except ceftaroline). Preferred therapy is vancomycin unless clinically contraindicated. Patient requires contact precautions  if  hospitalized. CRITICAL RESULT CALLED TO, READ BACK BY AND VERIFIED WITH: CHRISTINE KATSOUDAS AT 0263 ON 10/24/2017 JJB    Methicillin resistance DETECTED (A) NOT DETECTED Final    Comment: CRITICAL RESULT CALLED TO, READ BACK BY AND VERIFIED WITH: CHRISTINE KATSOUDAS AT 7858 ON 10/24/2017 JJB    Streptococcus species NOT DETECTED NOT DETECTED Final   Streptococcus agalactiae NOT DETECTED NOT DETECTED Final   Streptococcus pneumoniae NOT DETECTED NOT DETECTED Final   Streptococcus pyogenes NOT DETECTED NOT DETECTED Final   Acinetobacter baumannii NOT DETECTED NOT DETECTED Final   Enterobacteriaceae species NOT DETECTED NOT DETECTED Final    Enterobacter cloacae complex NOT DETECTED NOT DETECTED Final   Escherichia coli NOT DETECTED NOT DETECTED Final   Klebsiella oxytoca NOT DETECTED NOT DETECTED Final   Klebsiella pneumoniae NOT DETECTED NOT DETECTED Final   Proteus species NOT DETECTED NOT DETECTED Final   Serratia marcescens NOT DETECTED NOT DETECTED Final   Haemophilus influenzae NOT DETECTED NOT DETECTED Final   Neisseria meningitidis NOT DETECTED NOT DETECTED Final   Pseudomonas aeruginosa NOT DETECTED NOT DETECTED Final   Candida albicans NOT DETECTED NOT DETECTED Final   Candida glabrata NOT DETECTED NOT DETECTED Final   Candida krusei NOT DETECTED NOT DETECTED Final   Candida parapsilosis NOT DETECTED NOT DETECTED Final   Candida tropicalis NOT DETECTED NOT DETECTED Final  Blood Culture (routine x 2)     Status: None (Preliminary result)   Collection Time: 10/23/17 10:57 PM  Result Value Ref Range Status   Specimen Description BLOOD RIGHT WRIST  Final   Special Requests   Final    BOTTLES DRAWN AEROBIC AND ANAEROBIC Blood Culture adequate volume   Culture NO GROWTH < 12 HOURS  Final   Report Status PENDING  Incomplete  MRSA PCR Screening     Status: Abnormal   Collection Time: 10/24/17  5:18 AM  Result Value Ref Range Status   MRSA by PCR POSITIVE (A) NEGATIVE Final    Comment:        The GeneXpert MRSA Assay (FDA approved for NASAL specimens only), is one component of a comprehensive MRSA colonization surveillance program. It is not intended to diagnose MRSA infection nor to guide or monitor treatment for MRSA infections. RESULT CALLED TO, READ BACK BY AND VERIFIED WITHDorna Bloom @ 8502 11/02/17 [place]      Thank you for allowing pharmacy to be a part of this patient's care.  Donna Christen Lyn Deemer 10/24/2017 7:18 PM

## 2017-10-24 NOTE — ED Notes (Signed)
Pt received Solumedrol in ambulance in route to hospital; per admitting MD, next dose due at 0500

## 2017-10-24 NOTE — Progress Notes (Signed)
Initial Nutrition Assessment  DOCUMENTATION CODES:   Morbid obesity  INTERVENTION:  Provide Ensure Enlive po BID, each supplement provides 350 kcal and 20 grams of protein. Patient prefers chocolate.  Provide daily multivitamin with minerals.  Recommend 1:1 assistance with meals.  NUTRITION DIAGNOSIS:   Inadequate oral intake related to decreased appetite, other (see comment)(difficulty with self-feeding) as evidenced by per patient/family report.  GOAL:   Patient will meet greater than or equal to 90% of their needs  MONITOR:   PO intake, Supplement acceptance, Labs, Weight trends, Skin, I & O's  REASON FOR ASSESSMENT:   Malnutrition Screening Tool    ASSESSMENT:   76 year old female with PMHx of COPD, CKD, cardiomyopathy, DM type 2, HTN, sleep apnea who presented from [facility] with difficulty breathing found to have acute exacerbation of COPD.   Met with patient and her family member at bedside. Patient reports she has had decreased intake since the beginning of September when she went to [facility]. She does not like the food there and she is also having difficulty feeding herself. She reports she can pick up a piece of fruit, but she has trouble picking up silverware and keeping it steady while she eats. She reports OT has been following there. She is not currently getting assistance with eating meals unless family is there. She reports her family will likely be here at each meal to help with eating. She has typically been finishing bites of meals unless family is there to help. She reports she is drinking chocolate Ensure BID. Patient denies any N/V, abdominal pain, constipation/diarrhea, or difficulty chewing/swallowing.  UBW was 250 lbs. RD obtained bed scale weight of 241.7 lbs. Patient has lost 8.3 lbs (3.3% body weight) over 2 months, which is not significant for time frame.  Medications reviewed and include: azithromycin, Vitamin D 1000 units daily,  Novolog 0-15 units TID, Novolog 0-5 units QHS, magnesium oxide 400 mg daily, methylprednisolone 40 mg daily.  Labs reviewed: CBG 137, Chloride 99.  Patient does not meet criteria for malnutrition at this time.  NUTRITION - FOCUSED PHYSICAL EXAM:    Most Recent Value  Orbital Region  No depletion  Upper Arm Region  No depletion  Thoracic and Lumbar Region  No depletion  Buccal Region  No depletion  Temple Region  No depletion  Clavicle Bone Region  No depletion  Clavicle and Acromion Bone Region  No depletion  Scapular Bone Region  No depletion  Dorsal Hand  No depletion  Patellar Region  No depletion  Anterior Thigh Region  No depletion  Posterior Calf Region  No depletion  Edema (RD Assessment)  None  Hair  Reviewed  Eyes  Reviewed  Mouth  Reviewed  Skin  Reviewed  Nails  Reviewed     Diet Order:  Diet Heart Room service appropriate? Yes; Fluid consistency: Thin  EDUCATION NEEDS:   No education needs have been identified at this time  Skin:  Skin Assessment: Skin Integrity Issues: Skin Integrity Issues:: Stage II, Incisions, Other (Comment) Stage II: left arm (1.5cm x 1.5cm); left elbow (0.5cm x 0.5cm) Incisions: closed incision to left arm Other: MSAD to groin  Last BM:  10/24/2017 - medium type 5  Height:   Ht Readings from Last 1 Encounters:  10/24/17 _0  (1.651 m)    Weight:   Wt Readings from Last 1 Encounters:  10/24/17 241 lb 11.2 oz (109.6 kg)    Ideal Body Weight:  56.8 kg  BMI:  Body mass index is 40.22 kg/m.  Estimated Nutritional Needs:   Kcal:  4827-0786 (MSJ x 1.2-1.4)  Protein:  110-120 grams (1-1.1 grams/kg)  Fluid:  1.4-1.7 L/day (25-30 mL/kg IBW)  Willey Blade, MS, RD, LDN Office: [phone] Pager: [phone] After Hours/Weekend Pager: [phone]

## 2017-10-24 NOTE — H&P (Signed)
[facility] NAME: Sara Pierce    MR#:  [id number]  DATE OF BIRTH:  Feb 22, 1941  DATE OF ADMISSION:  10/23/2017  PRIMARY CARE PHYSICIAN: System, Provider Not In   REQUESTING/REFERRING PHYSICIAN:   CHIEF COMPLAINT:  No chief complaint on file.   HISTORY OF PRESENT ILLNESS: Sara Pierce  is a 76 y.o. female with a known history of cardiomyopathy, chronic kidney disease, COPD, diabetes mellitus type 2, sleep apnea is a resident of [facility] Patient was referred for shortness of breath. Patient was having wheezing and difficulty breathing and she was referred. Patient also passed out at the facility. Patient was initially put on BiPAP in the emergency room and was given IV Solu-Medrol and the hospital. She also received breathing treatments. She was weaned off BiPAP in the emergency room. No complaints of any fever or chills. Has cough. Patient was worked up with CT chest which showed no pulmonary embolism. Hospitalist service was consulted for further care.  PAST MEDICAL HISTORY:   Past Medical History:  Diagnosis Date  . Arthritis   . Cardiomyopathy ([place])   . Cataract   . Cerebral hemorrhage ([place])   . Chronic kidney disease   . Clotting disorder ([place])   . COPD (chronic obstructive pulmonary disease) ([place])   . Depression   . Diabetes mellitus without complication ([place])   . Hypertension   . Mixed incontinence   . Obesity   . Sleep apnea   . Thyroid disease   . Urinary frequency   . Vaginal atrophy   . Varicose veins     PAST SURGICAL HISTORY:  Past Surgical History:  Procedure Laterality Date  . APPENDECTOMY    . Cardiac Catherization    . JOINT REPLACEMENT    . STRABISMUS SURGERY    . TONSILLECTOMY    . TUBAL LIGATION      SOCIAL HISTORY:  Social History   Tobacco Use  . Smoking status: Former Smoker    Packs/day: 0.50    Years: 44.00    Pack years: 22.00    Types: Cigarettes  . Smokeless tobacco:  Never Used  Substance Use Topics  . Alcohol use: No    FAMILY HISTORY:  Family History  Problem Relation Age of Onset  . Heart attack Father   . Coronary artery disease Father   . Hypertension Father   . Hyperlipidemia Father   . Heart attack Mother   . Hypertension Mother   . Hyperlipidemia Mother   . Breast cancer Neg Hx     DRUG ALLERGIES:  Allergies  Allergen Reactions  . Aspirin Swelling  . Codeine Itching  . Naprosyn [Naproxen] Other (See Comments)    Per MAR pt allergic  . Other     Elastic Bandages/ Supports Per MAR  . Tape Hives  . Valacyclovir     REVIEW OF SYSTEMS:   CONSTITUTIONAL: No fever, fatigue or weakness.  EYES: No blurred or double vision.  EARS, NOSE, AND THROAT: No tinnitus or ear pain.  RESPIRATORY: Has  cough, shortness of breath, wheezing  No hemoptysis.  CARDIOVASCULAR: No chest pain, orthopnea, edema.  GASTROINTESTINAL: No nausea, vomiting, diarrhea or abdominal pain.  GENITOURINARY: No dysuria, hematuria.  ENDOCRINE: No polyuria, nocturia,  HEMATOLOGY: No anemia, easy bruising or bleeding SKIN: No rash or lesion. MUSCULOSKELETAL: No joint pain or arthritis.   NEUROLOGIC: No tingling, numbness, weakness.  PSYCHIATRY: No anxiety or depression.   MEDICATIONS AT HOME:  Prior to Admission medications   Medication Sig Start Date End Date Taking? Authorizing Provider  acetaminophen (TYLENOL) 650 MG CR tablet Take 650 mg every 6 (six) hours by mouth. scheduled   Yes [provider]  alum & mag hydroxide-simeth (MAALOX/MYLANTA) 200-200-20 MG/5ML suspension Take 15 mLs by mouth every 6 (six) hours as needed for indigestion or heartburn.   Yes [provider]  cholecalciferol (VITAMIN D) 1000 units tablet Take 1,000 Units daily by mouth.   Yes [provider]  diazepam (VALIUM) 5 MG tablet Take 5 mg every 6 (six) hours as needed by mouth for anxiety.   Yes [provider]  enoxaparin (LOVENOX) 40 MG/0.4ML  injection Inject 40 mg daily into the skin.   Yes [provider]  FLUoxetine (PROZAC) 40 MG capsule Take 40 mg by mouth daily.   Yes [provider]  fluticasone (FLONASE) 50 MCG/ACT nasal spray Place 2 sprays into both nostrils daily.    Yes [provider]  furosemide (LASIX) 40 MG tablet Take 40 mg by mouth daily.    Yes [provider]  gabapentin (NEURONTIN) 300 MG capsule Take 300 mg at bedtime by mouth.   Yes [provider]  ipratropium-albuterol (DUONEB) 0.5-2.5 (3) MG/3ML SOLN Inhale 3 mLs into the lungs every 6 (six) hours as needed (wheezing).    Yes [provider]  liothyronine (CYTOMEL) 25 MCG tablet Take 25 mcg by mouth daily.    Yes [provider]  liver oil-zinc oxide (DESITIN) 40 % ointment Apply 1 application daily topically. After bath   Yes [provider]  magnesium oxide (MAG-OX) 400 MG tablet Take 400 mg by mouth daily.    Yes [provider]  methocarbamol (ROBAXIN) 750 MG tablet Take 1,500 mg every 6 (six) hours by mouth.   Yes [provider]  montelukast (SINGULAIR) 10 MG tablet Take 10 mg by mouth at bedtime.    Yes [provider]  naloxone (NARCAN) nasal spray 4 mg/0.1 mL Place 1 spray into the nose. Repeat every 3 minutes if no response.   Yes [provider]  nystatin cream (MYCOSTATIN) Apply 1 application topically 2 (two) times daily.   Yes [provider]  Oxycodone HCl 10 MG TABS Take 10 mg every 6 (six) hours as needed by mouth for moderate pain, severe pain or breakthrough pain.    Yes [provider]  polyethylene glycol (MIRALAX / GLYCOLAX) packet Take 17 g by mouth daily.   Yes [provider]  potassium chloride (K-DUR) 10 MEQ tablet Take 10 mEq by mouth daily.    Yes [provider]  predniSONE (DELTASONE) 10 MG tablet Take 20 mg once by mouth. One time dose   Yes [provider]  primidone (MYSOLINE)  50 MG tablet Take 50 mg by mouth 2 (two) times daily.    Yes [provider]  senna-docusate (SENOKOT-S) 8.6-50 MG tablet Take 2 tablets by mouth daily.   Yes [provider]  simvastatin (ZOCOR) 40 MG tablet Take 40 mg by mouth at bedtime.    Yes [provider]  umeclidinium-vilanterol (ANORO ELLIPTA) 62.5-25 MCG/INH AEPB Inhale 1 puff into the lungs daily.   Yes [provider]  Vitamin D, Ergocalciferol, (DRISDOL) 50000 units CAPS capsule Take 50,000 Units every 7 (seven) days by mouth.   Yes [provider]  albuterol (PROVENTIL HFA;VENTOLIN HFA) 108 (90 Base) MCG/ACT inhaler Inhale 2 puffs into the lungs every 6 (six) hours as  needed for wheezing or shortness of breath. Patient not taking: Reported on 10/24/2017 06/15/17   Rudene Re, MD  albuterol (PROVENTIL) (2.5 MG/3ML) 0.083% nebulizer solution Take 2.5 mg by nebulization every 4 (four) hours as needed for wheezing or shortness of breath.    [provider]  buPROPion (WELLBUTRIN XL) 150 MG 24 hr tablet Take 150 mg by mouth daily.    [provider]  cyclobenzaprine (FLEXERIL) 5 MG tablet Take 5 mg by mouth 2 (two) times daily as needed for muscle spasms.    [provider]  diphenhydrAMINE (BENADRYL) 25 MG tablet Take 25 mg by mouth daily as needed (burning/ flushing).    [provider]  docusate sodium (COLACE) 100 MG capsule Take 1 capsule (100 mg total) by mouth 2 (two) times daily as needed for mild constipation. Patient not taking: Reported on 09/11/2017 10/04/16   Nicholes Mango, MD  guaiFENesin-dextromethorphan (ROBITUSSIN DM) 100-10 MG/5ML syrup Take 10 mLs by mouth every 6 (six) hours as needed for cough. Patient not taking: Reported on 10/24/2017 10/04/16   Nicholes Mango, MD  hydrOXYzine (ATARAX/VISTARIL) 25 MG tablet Take 25 mg by mouth 2 (two) times daily as needed for anxiety.    [provider]  levofloxacin (LEVAQUIN) 750 MG tablet  Take 750 mg by mouth daily.    [provider]  pregabalin (LYRICA) 50 MG capsule Take 50 mg by mouth daily.    [provider]  sodium chloride (OCEAN) 0.65 % SOLN nasal spray Place 2 sprays into both nostrils 3 (three) times daily as needed for congestion. Patient not taking: Reported on 10/24/2017 10/04/16   Nicholes Mango, MD  spironolactone (ALDACTONE) 25 MG tablet Take 1 tablet (25 mg total) by mouth daily. Patient not taking: Reported on 10/24/2017 11/19/15   Gladstone Lighter, MD  tiZANidine (ZANAFLEX) 4 MG tablet Take 4 mg by mouth every 12 (twelve) hours as needed for muscle spasms.    [provider]  traMADol (ULTRAM) 50 MG tablet Take 50 mg by mouth every 6 (six) hours as needed for moderate pain.    [provider]      PHYSICAL EXAMINATION:   VITAL SIGNS: Blood pressure 127/84, pulse 89, temperature 98.4 F (36.9 C), temperature source Oral, resp. rate 18, weight 112.6 kg (248 lb 3.8 oz), SpO2 96 %.  GENERAL:  76 y.o.-year-old obese patient lying in the bed with no acute distress.  EYES: Pupils equal, round, reactive to light and accommodation. No scleral icterus. Extraocular muscles intact.  HEENT: Head atraumatic, normocephalic. Oropharynx and nasopharynx clear.  NECK:  Supple, no jugular venous distention. No thyroid enlargement, no tenderness.  LUNGS: Decreased breath sounds bilaterally, bilateral wheezing noted. No use of accessory muscles of respiration.  CARDIOVASCULAR: S1, S2 normal. No murmurs, rubs, or gallops.  ABDOMEN: Soft, nontender, nondistended. Bowel sounds present. No organomegaly or mass.  EXTREMITIES: No pedal edema, cyanosis, or clubbing.  Has wrist brace NEUROLOGIC: Cranial nerves II through XII are intact. Muscle strength 5/5 in all extremities. Sensation intact. Gait not checked.  PSYCHIATRIC: The patient is alert and oriented x 3.  SKIN: No obvious rash, lesion, or ulcer.   LABORATORY PANEL:   CBC Recent Labs   Lab 10/23/17 2256  WBC 9.4  HGB 12.2  HCT 36.9  PLT 304  MCV 95.0  MCH 31.3  MCHC 32.9  RDW 14.6*  LYMPHSABS 2.6  MONOABS 0.2  EOSABS 0.0  BASOSABS 0.0   ------------------------------------------------------------------------------------------------------------------  Chemistries  Recent Labs  Lab  10/23/17 2256  NA 140  K 4.8  CL 97*  CO2 32  GLUCOSE 180*  BUN 20  CREATININE 0.67  CALCIUM 9.4  AST 39  ALT 27  ALKPHOS 74  BILITOT 0.5   ------------------------------------------------------------------------------------------------------------------ estimated creatinine clearance is 77.1 mL/min (by C-G formula based on SCr of 0.67 mg/dL). ------------------------------------------------------------------------------------------------------------------ No results for input(s): TSH, T4TOTAL, T3FREE, THYROIDAB in the last 72 hours.  Invalid input(s): FREET3   Coagulation profile Recent Labs  Lab 10/23/17 2256  INR 0.88   ------------------------------------------------------------------------------------------------------------------- No results for input(s): DDIMER in the last 72 hours. -------------------------------------------------------------------------------------------------------------------  Cardiac Enzymes Recent Labs  Lab 10/23/17 2256  TROPONINI <0.03   ------------------------------------------------------------------------------------------------------------------ Invalid input(s): POCBNP  ---------------------------------------------------------------------------------------------------------------  Urinalysis    Component Value Date/Time   COLORURINE YELLOW (A) 09/04/2017 0250   APPEARANCEUR CLEAR (A) 09/04/2017 0250   APPEARANCEUR Clear 05/19/2014 2245   LABSPEC 1.019 09/04/2017 0250   LABSPEC 1.008 05/19/2014 2245   PHURINE 5.0 09/04/2017 0250   GLUCOSEU NEGATIVE 09/04/2017 0250   GLUCOSEU Negative 05/19/2014 2245   HGBUR  NEGATIVE 09/04/2017 0250   BILIRUBINUR NEGATIVE 09/04/2017 0250   BILIRUBINUR Negative 05/19/2014 2245   KETONESUR NEGATIVE 09/04/2017 0250   PROTEINUR NEGATIVE 09/04/2017 0250   UROBILINOGEN 1.0 03/05/2011 1312   NITRITE NEGATIVE 09/04/2017 0250   LEUKOCYTESUR NEGATIVE 09/04/2017 0250   LEUKOCYTESUR Negative 05/19/2014 2245     RADIOLOGY: Ct Angio Chest Pe W And/or Wo Contrast  Result Date: 10/24/2017 CLINICAL DATA:  Acute onset of generalized chest pain and difficulty breathing. Syncope. EXAM: CT ANGIOGRAPHY CHEST WITH CONTRAST TECHNIQUE: Multidetector CT imaging of the chest was performed using the standard protocol during bolus administration of intravenous contrast. Multiplanar CT image reconstructions and MIPs were obtained to evaluate the vascular anatomy. CONTRAST:  61mL ISOVUE-370 IOPAMIDOL (ISOVUE-370) INJECTION 76% COMPARISON:  Chest radiograph performed 10/23/2017, and CTA of the chest performed 09/11/2017 FINDINGS: Cardiovascular: There is no definite evidence of pulmonary embolus. Evaluation for pulmonary embolus is mildly suboptimal due to low beam hardening artifact. Scattered coronary artery calcifications are seen. Scattered calcification is noted along the aortic arch and great vessels. The heart is normal in size. Mediastinum/Nodes: The mediastinum is otherwise unremarkable in appearance. No mediastinal lymphadenopathy is seen. No pericardial effusion is identified. The thyroid gland is unremarkable. No axillary lymphadenopathy is seen. Lungs/Pleura: Bilateral dependent subsegmental atelectasis is noted. No pleural effusion or pneumothorax is seen. No masses are identified. Upper Abdomen: The visualized portions of the liver and spleen are unremarkable. There is mild nodularity at the left adrenal gland, without a well-defined mass. The visualized portions of the kidneys are grossly unremarkable. Musculoskeletal: No acute osseous abnormalities are identified. There is chronic loss  of height at vertebral body T6. The visualized musculature is unremarkable in appearance. There is healed chronic deformity of the proximal left humerus. A right humeral head prosthesis is partially imaged. Review of the MIP images confirms the above findings. IMPRESSION: 1. No definite evidence of pulmonary embolus. 2. Bilateral fat sulcal atelectasis noted.  Lungs otherwise clear. 3. Scattered coronary artery calcifications. 4. Chronic loss of height at vertebral body T6. Electronically Signed   By: Garald Balding M.D.   On: 10/24/2017 03:19   Dg Chest Port 1 View  Result Date: 10/24/2017 CLINICAL DATA:  76 y/o  F; acute onset of shortness of breath. EXAM: PORTABLE CHEST 1 VIEW COMPARISON:  09/11/2017 chest radiograph. FINDINGS: Normal cardiac silhouette. Aortic atherosclerosis with calcification. Bibasilar bronchitic changes. No focal consolidation. Possible small left effusion. No  pneumothorax. Multilevel degenerative changes of the spine. Chronic left proximal humerus fracture and right shoulder arthroplasty partially visualized. IMPRESSION: Bronchitic changes in lung bases. Stable cardiac silhouette. Aortic atherosclerosis. Possible small left effusion. Electronically Signed   By: Kristine Garbe M.D.   On: 10/24/2017 00:15    EKG: Orders placed or performed during the hospital encounter of 10/23/17  . ED EKG 12-Lead  . ED EKG 12-Lead    IMPRESSION AND PLAN: 76 year old female patient with history of COPD, chronic kidney disease, diabetes mellitus, hypertension presented to the restroom with increased shortness of breath and wheezing.  Admitting diagnosis 1. Acute COPD exacerbation 2. Syncope 3. Diabetes mellitus 4. Hypertension Treatment plan : Admit patient to medical floor Cardiac monitoring Cycle troponin Check echocardiogram and carotid ultrasound IV Solu-Medrol 125 MG every 6 hourly Aggressive breathing treatments Start patient on IV Rocephin and Zithromax  antibiotics  All the records are reviewed and case discussed with ED provider. Management plans discussed with the patient, family and they are in agreement.  CODE STATUS: Full code Code Status History    Date Active Date Inactive Code Status Order ID Comments User Context   08/20/2017 01:34 08/21/2017 18:28 Full Code [id number]  Lance Coon, MD Inpatient   10/03/2016 05:40 10/04/2016 17:11 Full Code [id number]  Harrie Foreman, MD Inpatient   11/15/2015 22:23 11/19/2015 15:14 Full Code [id number]  Hower, Aaron Mose, MD ED    Advance Directive Documentation     Most Recent Value  Type of Advance Directive  -- [pt reports her husband]  Pre-existing out of facility DNR order (yellow form or pink MOST form)  No data  "MOST" Form in Place?  No data       TOTAL TIME TAKING CARE OF THIS PATIENT: 52 minutes.    Saundra Shelling M.D on 10/24/2017 at 4:42 AM  Between 7am to 6pm - Pager - [phone]  After 6pm go to [email or web address] - password [email or web address]  [facility]  Office  [phone]  CC: Primary care physician; System, Provider Not In

## 2017-10-24 NOTE — ED Notes (Signed)
In and out cath attempt made without success, pt tol well, pt's soiled depends removed and pt cleaned and new depends placed

## 2017-10-24 NOTE — ED Notes (Signed)
Called pt's husband at her request, she's would like him to come up here to be with her; was able to reach her husband, he was unaware she was at the hospital;

## 2017-10-24 NOTE — ED Notes (Signed)
Pt transport to 127

## 2017-10-24 NOTE — Progress Notes (Signed)
*  PRELIMINARY RESULTS* Echocardiogram 2D Echocardiogram has been performed.  Sara Pierce 10/24/2017, 3:12 PM

## 2017-10-24 NOTE — Progress Notes (Signed)
      INFECTIOUS DISEASE ATTENDING ADDENDUM:   Date: 10/24/2017  Patient name: Sara Pierce  Medical record number: [id number]  Date of birth: 10-19-1941  10/24/2017, 6:42 PM      [place] Antimicrobial Management Team Staphylococcus aureus bacteremia   Staphylococcus aureus bacteremia (SAB) is associated with a high rate of complications and mortality.  Specific aspects of clinical management are critical to optimizing the outcome of patients with SAB.  Therefore, the [facility] Antimicrobial Management Team [facility]) has initiated an intervention aimed at improving the management of SAB at [facility].  To do so, Infectious Diseases physicians are providing an evidence-based consult for the management of all patients with SAB.     Yes No Comments  Perform follow-up blood cultures (even if the patient is afebrile) to ensure clearance of bacteremia [x]  []  Check tomorrow after pt has been on anti MRSA abx  Remove vascular catheter and obtain follow-up blood cultures after the removal of the catheter []  []  DOES SHE HAVE CATHETER if so she needs to have her central line removed and line holiday  Perform echocardiography to evaluate for endocarditis (transthoracic ECHO is 40-50% sensitive, TEE is > 90% sensitive) []  []  Please keep in mind, that neither test can definitively EXCLUDE endocarditis, and that should clinical suspicion remain high for endocarditis the patient should then still be treated with an "endocarditis" duration of therapy = 6 weeks TTE was odone but no read yet  Consult electrophysiologist to evaluate implanted cardiac device (pacemaker, ICD) []  []    Ensure source control [x]  []  Have all abscesses been drained effectively? Have deep seeded infections (septic joints or osteomyelitis) had appropriate surgical debridement?  Investigate for "metastatic" sites of infection [x]  []  Does the patient have ANY symptom or physical exam finding that would suggest a deeper infection  (back or neck pain that may be suggestive of vertebral osteomyelitis or epidural abscess, muscle pain that could be a symptom of pyomyositis)?  Keep in mind that for deep seeded infections MRI imaging with contrast is preferred rather than other often insensitive tests such as plain x-rays, especially early in a patient's presentation.  Change antibiotic therapy to vancomycin [x]  []  Beta-lactam antibiotics are preferred for MSSA due to higher cure rates.   If on Vancomycin, goal trough should be 15 - 20 mcg/mL  Estimated duration of IV antibiotic therapy:  4-6 weeks []  []  Consult case management for probably prolonged outpatient IV antibiotic therapy   I will alert Dr. Ola Spurr who will be making rounds tomorrow.

## 2017-10-24 NOTE — ED Notes (Signed)
Pt taken to ct 

## 2017-10-24 NOTE — NC FL2 (Signed)
[place] LEVEL OF CARE SCREENING TOOL     IDENTIFICATION  Patient Name: Sara Pierce Birthdate: 1941/09/04 Sex: female Admission Date (Current Location): 10/23/2017  [facility] and [place] Number:  [name] ([id number]) Facility and Address:  [facility], [address] [place], [place] [address]      Provider Number: [id number]  Attending Physician Name and Address:  Loletha Grayer, MD  Relative Name and Phone Number:       Current Level of Care: Hospital Recommended Level of Care: [place] Prior Approval Number:    Date Approved/Denied:   PASRR Number: [id number])  Discharge Plan: SNF    Current Diagnoses: Patient Active Problem List   Diagnosis Date Noted  . COPD with exacerbation ([place]) 10/24/2017  . HCAP (healthcare-associated pneumonia) 08/19/2017  . CAP (community acquired pneumonia) 10/03/2016  . Sepsis ([place]) 11/15/2015  . Absolute anemia 03/15/2015  . Anxiety 03/15/2015  . Arthritis 03/15/2015  . Cardiomyopathy, secondary ([place]) 03/15/2015  . Cataract 03/15/2015  . Brain bleed ([place]) 03/15/2015  . Chronic constipation 03/15/2015  . Chronic kidney failure 03/15/2015  . Blood clotting disorder ([place]) 03/15/2015  . CAFL (chronic airflow limitation) ([place]) 03/15/2015  . Clinical depression 03/15/2015  . Diabetes ([place]) 03/15/2015  . H/O varicella 03/15/2015  . BP (high blood pressure) 03/15/2015  . Mixed incontinence 03/15/2015  . 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 Adiposity 03/15/2015  . Apnea, sleep 03/15/2015  . Disease of thyroid gland 03/15/2015  . FOM (frequency of micturition) 03/15/2015  . 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 Candida vaginitis 03/15/2015  . Abnormal ECG 03/15/2015    Orientation RESPIRATION BLADDER Height & Weight     Self, Time, Situation, Place  O2(3 Liters Oxygen ) Incontinent Weight: 241 lb 11.2 oz (109.6 kg)(bed  scale) Height:  5\' 5"  (165.1 cm)  BEHAVIORAL SYMPTOMS/MOOD NEUROLOGICAL BOWEL NUTRITION STATUS      Incontinent Diet(Diet: Heart Healthy )  AMBULATORY STATUS COMMUNICATION OF NEEDS Skin   Extensive Assist Verbally PU Stage and Appropriate Care(pressure ulcer stage 2 on left elbow and left arm. )                       Personal Care Assistance Level of Assistance  Bathing, Feeding, Dressing Bathing Assistance: Limited assistance Feeding assistance: Independent Dressing Assistance: Limited assistance     Functional Limitations Info  Sight, Hearing, Speech Sight Info: Adequate Hearing Info: Adequate Speech Info: Adequate    SPECIAL CARE FACTORS FREQUENCY  PT (By licensed PT), OT (By licensed OT)     PT Frequency: (5) OT Frequency: (5)            Contractures      Additional Factors Info  Code Status, Allergies, Isolation Precautions Code Status Info: (Full Code. ) Allergies Info: (Aspirin, Codeine, Naprosyn Naproxen, Other, Tape, Valacyclovir)     Isolation Precautions Info: (MRSA Nasal Swab. )     Current Medications (10/24/2017):  This is the current hospital active medication list Current Facility-Administered Medications  Medication Dose Route Frequency Provider Last Rate Last Dose  . 0.9 %  sodium chloride infusion  250 mL Intravenous PRN Pyreddy, Reatha Harps, MD      . acetaminophen (TYLENOL) tablet 650 mg  650 mg Oral Q6H PRN Pyreddy, Reatha Harps, MD       Or  . acetaminophen (TYLENOL) suppository 650 mg  650 mg Rectal Q6H PRN Saundra Shelling, MD      .  alum & mag hydroxide-simeth (MAALOX/MYLANTA) 200-200-20 MG/5ML suspension 15 mL  15 mL Oral Q6H PRN Pyreddy, Pavan, MD      . azithromycin (ZITHROMAX) tablet 250 mg  250 mg Oral q1800 Wieting, Richard, MD      . budesonide (PULMICORT) nebulizer solution 0.5 mg  0.5 mg Nebulization BID Wieting, Richard, MD      . cholecalciferol (VITAMIN D) tablet 1,000 Units  1,000 Units Oral Daily Saundra Shelling, MD   1,000 Units at  10/24/17 0914  . cyclobenzaprine (FLEXERIL) tablet 5 mg  5 mg Oral BID PRN Saundra Shelling, MD      . diazepam (VALIUM) tablet 5 mg  5 mg Oral Q6H PRN Pyreddy, Pavan, MD      . enoxaparin (LOVENOX) injection 40 mg  40 mg Subcutaneous Q24H Saundra Shelling, MD   40 mg at 10/24/17 0658  . [START ON 10/25/2017] feeding supplement (ENSURE ENLIVE) (ENSURE ENLIVE) liquid 237 mL  237 mL Oral BID BM Wieting, Richard, MD      . FLUoxetine (PROZAC) capsule 40 mg  40 mg Oral Daily Pyreddy, Reatha Harps, MD   40 mg at 10/24/17 0914  . fluticasone (FLONASE) 50 MCG/ACT nasal spray 2 spray  2 spray Each Nare Daily Saundra Shelling, MD   2 spray at 10/24/17 1032  . gabapentin (NEURONTIN) capsule 300 mg  300 mg Oral QHS Pyreddy, Pavan, MD      . HYDROcodone-acetaminophen (NORCO/VICODIN) 5-325 MG per tablet 1-2 tablet  1-2 tablet Oral Q4H PRN Pyreddy, Pavan, MD      . insulin aspart (novoLOG) injection 0-15 Units  0-15 Units Subcutaneous TID WC Saundra Shelling, MD   3 Units at 10/24/17 1231  . insulin aspart (novoLOG) injection 0-5 Units  0-5 Units Subcutaneous QHS Pyreddy, Pavan, MD      . ipratropium-albuterol (DUONEB) 0.5-2.5 (3) MG/3ML nebulizer solution 3 mL  3 mL Nebulization Q6H Pyreddy, Pavan, MD   3 mL at 10/24/17 1342  . magnesium oxide (MAG-OX) tablet 400 mg  400 mg Oral Daily Pyreddy, Reatha Harps, MD   400 mg at 10/24/17 0914  . [START ON 10/25/2017] methylPREDNISolone sodium succinate (SOLU-MEDROL) 40 mg/mL injection 40 mg  40 mg Intravenous Daily Wieting, Richard, MD      . montelukast (SINGULAIR) tablet 10 mg  10 mg Oral QHS Saundra Shelling, MD      . Derrill Memo ON 10/25/2017] multivitamin with minerals tablet 1 tablet  1 tablet Oral Daily Wieting, Richard, MD      . ondansetron [facility]) tablet 4 mg  4 mg Oral Q6H PRN Pyreddy, Reatha Harps, MD       Or  . ondansetron (ZOFRAN) injection 4 mg  4 mg Intravenous Q6H PRN Pyreddy, Pavan, MD      . primidone (MYSOLINE) tablet 50 mg  50 mg Oral BID Saundra Shelling, MD   50 mg at 10/24/17  0914  . senna-docusate (Senokot-S) tablet 1 tablet  1 tablet Oral QHS PRN Saundra Shelling, MD      . simvastatin (ZOCOR) tablet 40 mg  40 mg Oral QHS Pyreddy, Pavan, MD      . sodium chloride flush (NS) 0.9 % injection 3 mL  3 mL Intravenous Q12H Pyreddy, Reatha Harps, MD   3 mL at 10/24/17 1035  . sodium chloride flush (NS) 0.9 % injection 3 mL  3 mL Intravenous PRN Saundra Shelling, MD         Discharge Medications: Please see discharge summary for a list of discharge medications.  Relevant Imaging Results:  Relevant Lab Results:   Additional Information (SSN: [id number])  Janayla Marik, Veronia Beets, LCSW
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
## 2017-10-24 NOTE — ED Notes (Signed)
Dr Dahlia Client in to follow up with pt

## 2017-10-24 NOTE — ED Notes (Signed)
Admitting MD at bedside.

## 2017-10-25 DIAGNOSIS — L8990 Pressure ulcer of unspecified site, unspecified stage: Secondary | ICD-10-CM

## 2017-10-25 LAB — ECHOCARDIOGRAM COMPLETE
Height: 65 in
WEIGHTICAEL: 3840 [oz_av]

## 2017-10-25 LAB — GLUCOSE, CAPILLARY
GLUCOSE-CAPILLARY: 83 mg/dL (ref 65–99)
Glucose-Capillary: 189 mg/dL — ABNORMAL HIGH (ref 65–99)
Glucose-Capillary: 106 mg/dL — ABNORMAL HIGH (ref 65–99)
Glucose-Capillary: 124 mg/dL — ABNORMAL HIGH (ref 65–99)

## 2017-10-25 MED ORDER — CHLORHEXIDINE GLUCONATE CLOTH 2 % EX PADS
6.0000 | MEDICATED_PAD | Freq: Every day | CUTANEOUS | Status: DC
  Administered 2017-10-25 – 2017-10-28 (×4): 6 via TOPICAL

## 2017-10-25 MED ORDER — MUPIROCIN 2 % EX OINT
1.0000 "application " | TOPICAL_OINTMENT | Freq: Two times a day (BID) | CUTANEOUS | Status: DC
  Administered 2017-10-25 – 2017-10-28 (×8): 1 via NASAL
  Filled 2017-10-25: qty 22

## 2017-10-25 MED ORDER — BENZONATATE 100 MG PO CAPS
100.0000 mg | ORAL_CAPSULE | Freq: Three times a day (TID) | ORAL | Status: DC
  Administered 2017-10-25 – 2017-10-28 (×9): 100 mg via ORAL
  Filled 2017-10-25 (×9): qty 1

## 2017-10-25 MED ORDER — BENZONATATE 100 MG PO CAPS: 100.0000 mg | ORAL_CAPSULE | Freq: Three times a day (TID) | ORAL | Status: DC | PRN

## 2017-10-25 MED ORDER — GUAIFENESIN 100 MG/5ML PO SOLN
5.0000 mL | ORAL | Status: DC | PRN
  Administered 2017-10-25 – 2017-10-27 (×2): 100 mg via ORAL
  Filled 2017-10-25 (×3): qty 5

## 2017-10-25 NOTE — Consult Note (Signed)
[facility] Infectious Disease     Reason for Consult:MRSA bacteremia,  Referring Physician: Earleen Newport Date of Admission:  10/23/2017   Active Problems:   COPD with exacerbation ([place])   Pressure injury of skin   HPI: Sara Pierce is a 76 y.o. female admitted with what seemed to be a copd exacerbation but now with MRSA bacteremia. She had L arm fracture and has had a splint in place and has a healing incision there. She has a vert fracture and has been in rehab as well.    Past Medical History:  Diagnosis Date  . Arthritis   . Cardiomyopathy ([place])   . Cataract   . Cerebral hemorrhage ([place])   . Chronic kidney disease   . Clotting disorder ([place])   . COPD (chronic obstructive pulmonary disease) ([place])   . Depression   . Diabetes mellitus without complication ([place])   . Hypertension   . Mixed incontinence   . Obesity   . Sleep apnea   . Thyroid disease   . Urinary frequency   . Vaginal atrophy   . Varicose veins    Past Surgical History:  Procedure Laterality Date  . APPENDECTOMY    . Cardiac Catherization    . JOINT REPLACEMENT    . STRABISMUS SURGERY    . TONSILLECTOMY    . TUBAL LIGATION     Social History   Tobacco Use  . Smoking status: Former Smoker    Packs/day: 0.50    Years: 44.00    Pack years: 22.00    Types: Cigarettes  . Smokeless tobacco: Never Used  Substance Use Topics  . Alcohol use: No  . Drug use: No   Family History  Problem Relation Age of Onset  . Heart attack Father   . Coronary artery disease Father   . Hypertension Father   . Hyperlipidemia Father   . Heart attack Mother   . Hypertension Mother   . Hyperlipidemia Mother   . Breast cancer Neg Hx     Allergies:  Allergies  Allergen Reactions  . Aspirin Swelling  . Codeine Itching  . Naprosyn [Naproxen] Other (See Comments)    Per MAR pt allergic  . Other     Elastic Bandages/ Supports Per MAR  . Tape Hives  . Valacyclovir     Current antibiotics: Antibiotics Given  (last 72 hours)    Date/Time Action Medication Dose Rate   10/24/17 1713 Given   azithromycin (ZITHROMAX) tablet 250 mg 250 mg    10/24/17 2004 New Bag/Given   vancomycin (VANCOCIN) IVPB 1000 mg/200 mL premix 1,000 mg 200 mL/hr   10/25/17 0502 New Bag/Given   vancomycin (VANCOCIN) 1,500 mg in sodium chloride 0.9 % 500 mL IVPB 1,500 mg 250 mL/hr      MEDICATIONS: . budesonide (PULMICORT) nebulizer solution  0.5 mg Nebulization BID  . Chlorhexidine Gluconate Cloth  6 each Topical Q0600  . cholecalciferol  1,000 Units Oral Daily  . enoxaparin (LOVENOX) injection  40 mg Subcutaneous Q24H  . feeding supplement (ENSURE ENLIVE)  237 mL Oral BID BM  . FLUoxetine  40 mg Oral Daily  . fluticasone  2 spray Each Nare Daily  . gabapentin  300 mg Oral QHS  . insulin aspart  0-15 Units Subcutaneous TID WC  . insulin aspart  0-5 Units Subcutaneous QHS  . ipratropium-albuterol  3 mL Nebulization Q6H  . magnesium oxide  400 mg Oral Daily  . methylPREDNISolone (SOLU-MEDROL) injection  40 mg Intravenous Daily  .  montelukast  10 mg Oral QHS  . multivitamin with minerals  1 tablet Oral Daily  . mupirocin ointment  1 application Nasal BID  . primidone  50 mg Oral BID  . simvastatin  40 mg Oral QHS  . sodium chloride flush  3 mL Intravenous Q12H    Review of Systems - 11 systems reviewed and negative per HPI   OBJECTIVE: Temp:  [97.6 F (36.4 C)-98.1 F (36.7 C)] 97.7 F (36.5 C) (11/13 1257) Pulse Rate:  [55-78] 65 (11/13 1257) Resp:  [16-20] 16 (11/13 1257) BP: (99-115)/(31-51) 115/51 (11/13 1257) SpO2:  [95 %-100 %] 100 % (11/13 1257) Weight:  [109.6 kg (241 lb 11.2 oz)] 109.6 kg (241 lb 11.2 oz) (11/12 1534) Physical Exam  Constitutional:  oriented to person, place, and time. appears well-developed and well-nourished. Chronically ill appeargin. HENT: [place]/AT, PERRLA, no scleral icterus Mouth/Throat: Oropharynx is clear and moist. No oropharyngeal exudate.  Cardiovascular: Normal rate,  regular rhythm and normal heart sounds.  Pulmonary/Chest: poor air movement  Neck = supple, no nuchal rigidity Abdominal: Soft. Bowel sounds are normal.  exhibits no distension.  Lymphadenopathy: no cervical adenopathy. No axillary adenopathy Neurological: alert and oriented to person, place, and time.  Skin: L elbow has some swelling and incision with some scabs Psychiatric: a normal mood and affect.  behavior is normal.    LABS: Results for orders placed or performed during the hospital encounter of 10/23/17 (from the past 48 hour(s))  Blood gas, venous     Status: Abnormal   Collection Time: 10/23/17 10:56 PM  Result Value Ref Range   pH, Ven 7.37 7.250 - 7.430   pCO2, Ven 61 (H) 44.0 - 60.0 mmHg   pO2, Ven 68.0 (H) 32.0 - 45.0 mmHg   Bicarbonate 35.3 (H) 20.0 - 28.0 mmol/L   Acid-Base Excess 8.2 (H) 0.0 - 2.0 mmol/L   O2 Saturation 92.7 %   Patient temperature 37.0    Collection site LINE    Sample type VENOUS   Lactic acid, plasma     Status: Abnormal   Collection Time: 10/23/17 10:56 PM  Result Value Ref Range   Lactic Acid, Venous 2.0 (HH) 0.5 - 1.9 mmol/L    Comment: CRITICAL RESULT CALLED TO, READ BACK BY AND VERIFIED WITH LEA FURGURSON AT 2336 ON 10/23/17 RWW   Comprehensive metabolic panel     Status: Abnormal   Collection Time: 10/23/17 10:56 PM  Result Value Ref Range   Sodium 140 135 - 145 mmol/L   Potassium 4.8 3.5 - 5.1 mmol/L   Chloride 97 (L) 101 - 111 mmol/L   CO2 32 22 - 32 mmol/L   Glucose, Bld 180 (H) 65 - 99 mg/dL   BUN 20 6 - 20 mg/dL   Creatinine, Ser 0.67 0.44 - 1.00 mg/dL   Calcium 9.4 8.9 - 10.3 mg/dL   Total Protein 6.7 6.5 - 8.1 g/dL   Albumin 3.6 3.5 - 5.0 g/dL   AST 39 15 - 41 U/L   ALT 27 14 - 54 U/L   Alkaline Phosphatase 74 38 - 126 U/L   Total Bilirubin 0.5 0.3 - 1.2 mg/dL   GFR calc non Af Amer >60 >60 mL/min   GFR calc Af Amer >60 >60 mL/min    Comment: (NOTE) The eGFR has been calculated using the CKD EPI equation. This  calculation has not been validated in all clinical situations. eGFR's persistently <60 mL/min signify possible Chronic Kidney Disease.    Anion gap  11 5 - 15  Brain natriuretic peptide     Status: None   Collection Time: 10/23/17 10:56 PM  Result Value Ref Range   B Natriuretic Peptide 41.0 0.0 - 100.0 pg/mL  Troponin I     Status: None   Collection Time: 10/23/17 10:56 PM  Result Value Ref Range   Troponin I <0.03 <0.03 ng/mL  CBC WITH DIFFERENTIAL     Status: Abnormal   Collection Time: 10/23/17 10:56 PM  Result Value Ref Range   WBC 9.4 3.6 - 11.0 K/uL   RBC 3.89 3.80 - 5.20 MIL/uL   Hemoglobin 12.2 12.0 - 16.0 g/dL   HCT 36.9 35.0 - 47.0 %   MCV 95.0 80.0 - 100.0 fL   MCH 31.3 26.0 - 34.0 pg   MCHC 32.9 32.0 - 36.0 g/dL   RDW 14.6 (H) 11.5 - 14.5 %   Platelets 304 150 - 440 K/uL    Comment: COUNT MAY BE INACCURATE DUE TO FIBRIN CLUMPS.   Neutrophils Relative % 70 %   Neutro Abs 6.6 (H) 1.4 - 6.5 K/uL   Lymphocytes Relative 28 %   Lymphs Abs 2.6 1.0 - 3.6 K/uL   Monocytes Relative 2 %   Monocytes Absolute 0.2 0.2 - 0.9 K/uL   Eosinophils Relative 0 %   Eosinophils Absolute 0.0 0 - 0.7 K/uL   Basophils Relative 0 %   Basophils Absolute 0.0 0 - 0.1 K/uL  Protime-INR     Status: None   Collection Time: 10/23/17 10:56 PM  Result Value Ref Range   Prothrombin Time 11.9 11.4 - 15.2 seconds   INR 0.88   Blood Culture (routine x 2)     Status: Abnormal (Preliminary result)   Collection Time: 10/23/17 10:56 PM  Result Value Ref Range   Specimen Description BLOOD RIGHT HAND    Special Requests      BOTTLES DRAWN AEROBIC AND ANAEROBIC Blood Culture adequate volume   Culture  Setup Time      AEROBIC BOTTLE ONLY GRAM POSITIVE COCCI CRITICAL RESULT CALLED TO, READ BACK BY AND VERIFIED WITH: CHRISTINE KATSOUDAS AT [id number] ON 10/24/2017 JJB    Culture (A)     STAPHYLOCOCCUS AUREUS SUSCEPTIBILITIES TO FOLLOW Performed at [facility], [place] [address], [place], [place]  [address]    Report Status PENDING   Blood Culture ID Panel (Reflexed)     Status: Abnormal   Collection Time: 10/23/17 10:56 PM  Result Value Ref Range   Enterococcus species NOT DETECTED NOT DETECTED   Listeria monocytogenes NOT DETECTED NOT DETECTED   Staphylococcus species DETECTED (A) NOT DETECTED    Comment: CRITICAL RESULT CALLED TO, READ BACK BY AND VERIFIED WITH: [place] AT 7829 ON 10/24/2017 JJB    Staphylococcus aureus DETECTED (A) NOT DETECTED    Comment: Methicillin (oxacillin)-resistant Staphylococcus aureus (MRSA). MRSA is predictably resistant to beta-lactam antibiotics (except ceftaroline). Preferred therapy is vancomycin unless clinically contraindicated. Patient requires contact precautions if  hospitalized. CRITICAL RESULT CALLED TO, READ BACK BY AND VERIFIED WITH: CHRISTINE KATSOUDAS AT 5621 ON 10/24/2017 JJB    Methicillin resistance DETECTED (A) NOT DETECTED    Comment: CRITICAL RESULT CALLED TO, READ BACK BY AND VERIFIED WITH: CHRISTINE KATSOUDAS AT 3086 ON 10/24/2017 JJB    Streptococcus species NOT DETECTED NOT DETECTED   Streptococcus agalactiae NOT DETECTED NOT DETECTED   Streptococcus pneumoniae NOT DETECTED NOT DETECTED   Streptococcus pyogenes NOT DETECTED NOT DETECTED   Acinetobacter baumannii NOT DETECTED NOT  DETECTED   Enterobacteriaceae species NOT DETECTED NOT DETECTED   Enterobacter cloacae complex NOT DETECTED NOT DETECTED   Escherichia coli NOT DETECTED NOT DETECTED   Klebsiella oxytoca NOT DETECTED NOT DETECTED   Klebsiella pneumoniae NOT DETECTED NOT DETECTED   Proteus species NOT DETECTED NOT DETECTED   Serratia marcescens NOT DETECTED NOT DETECTED   Haemophilus influenzae NOT DETECTED NOT DETECTED   Neisseria meningitidis NOT DETECTED NOT DETECTED   Pseudomonas aeruginosa NOT DETECTED NOT DETECTED   Candida albicans NOT DETECTED NOT DETECTED   Candida glabrata NOT DETECTED NOT DETECTED   Candida krusei NOT DETECTED NOT DETECTED    Candida parapsilosis NOT DETECTED NOT DETECTED   Candida tropicalis NOT DETECTED NOT DETECTED  Blood Culture (routine x 2)     Status: None (Preliminary result)   Collection Time: 10/23/17 10:57 PM  Result Value Ref Range   Specimen Description BLOOD RIGHT WRIST    Special Requests      BOTTLES DRAWN AEROBIC AND ANAEROBIC Blood Culture adequate volume   Culture NO GROWTH 2 DAYS    Report Status PENDING   MRSA PCR Screening     Status: Abnormal   Collection Time: 10/24/17  5:18 AM  Result Value Ref Range   MRSA by PCR POSITIVE (A) NEGATIVE    Comment:        The GeneXpert MRSA Assay (FDA approved for NASAL specimens only), is one component of a comprehensive MRSA colonization surveillance program. It is not intended to diagnose MRSA infection nor to guide or monitor treatment for MRSA infections. RESULT CALLED TO, READ BACK BY AND VERIFIED WITH: [facility] @ 7939 11/02/17 [place]   Troponin I     Status: None   Collection Time: 10/24/17  5:33 AM  Result Value Ref Range   Troponin I <0.03 <0.03 ng/mL  Lactic acid, plasma     Status: None   Collection Time: 10/24/17  5:33 AM  Result Value Ref Range   Lactic Acid, Venous 1.9 0.5 - 1.9 mmol/L  CBC     Status: Abnormal   Collection Time: 10/24/17  5:33 AM  Result Value Ref Range   WBC 8.6 3.6 - 11.0 K/uL   RBC 3.39 (L) 3.80 - 5.20 MIL/uL   Hemoglobin 10.7 (L) 12.0 - 16.0 g/dL   HCT 32.1 (L) 35.0 - 47.0 %   MCV 94.6 80.0 - 100.0 fL   MCH 31.4 26.0 - 34.0 pg   MCHC 33.2 32.0 - 36.0 g/dL   RDW 14.5 11.5 - 14.5 %   Platelets 224 150 - 440 K/uL  Basic metabolic panel     Status: Abnormal   Collection Time: 10/24/17  5:33 AM  Result Value Ref Range   Sodium 139 135 - 145 mmol/L   Potassium 4.2 3.5 - 5.1 mmol/L   Chloride 99 (L) 101 - 111 mmol/L   CO2 30 22 - 32 mmol/L   Glucose, Bld 173 (H) 65 - 99 mg/dL   BUN 18 6 - 20 mg/dL   Creatinine, Ser 0.62 0.44 - 1.00 mg/dL   Calcium 9.0 8.9 - 10.3 mg/dL   GFR calc non Af Amer  >60 >60 mL/min   GFR calc Af Amer >60 >60 mL/min    Comment: (NOTE) The eGFR has been calculated using the CKD EPI equation. This calculation has not been validated in all clinical situations. eGFR's persistently <60 mL/min signify possible Chronic Kidney Disease.    Anion gap 10 5 - 15  Lactic acid, plasma  Status: None   Collection Time: 10/24/17  7:45 AM  Result Value Ref Range   Lactic Acid, Venous 0.6 0.5 - 1.9 mmol/L  Glucose, capillary     Status: Abnormal   Collection Time: 10/24/17  9:32 AM  Result Value Ref Range   Glucose-Capillary 137 (H) 65 - 99 mg/dL  Troponin I     Status: None   Collection Time: 10/24/17 11:05 AM  Result Value Ref Range   Troponin I <0.03 <0.03 ng/mL  Glucose, capillary     Status: Abnormal   Collection Time: 10/24/17 12:25 PM  Result Value Ref Range   Glucose-Capillary 189 (H) 65 - 99 mg/dL  Glucose, capillary     Status: Abnormal   Collection Time: 10/24/17  4:37 PM  Result Value Ref Range   Glucose-Capillary 190 (H) 65 - 99 mg/dL  Glucose, capillary     Status: Abnormal   Collection Time: 10/24/17  9:43 PM  Result Value Ref Range   Glucose-Capillary 122 (H) 65 - 99 mg/dL  Glucose, capillary     Status: None   Collection Time: 10/25/17  8:02 AM  Result Value Ref Range   Glucose-Capillary 83 65 - 99 mg/dL  Glucose, capillary     Status: Abnormal   Collection Time: 10/25/17 11:40 AM  Result Value Ref Range   Glucose-Capillary 106 (H) 65 - 99 mg/dL   No components found for: ESR, C REACTIVE PROTEIN MICRO: Recent Results (from the past 720 hour(s))  Blood Culture (routine x 2)     Status: Abnormal (Preliminary result)   Collection Time: 10/23/17 10:56 PM  Result Value Ref Range Status   Specimen Description BLOOD RIGHT HAND  Final   Special Requests   Final    BOTTLES DRAWN AEROBIC AND ANAEROBIC Blood Culture adequate volume   Culture  Setup Time   Final    AEROBIC BOTTLE ONLY GRAM POSITIVE COCCI CRITICAL RESULT CALLED TO, READ  BACK BY AND VERIFIED WITH: CHRISTINE KATSOUDAS AT 7564 ON 10/24/2017 JJB    Culture (A)  Final    STAPHYLOCOCCUS AUREUS SUSCEPTIBILITIES TO FOLLOW Performed at [facility], [place] [address] [place], [place] [address]    Report Status PENDING  Incomplete  Blood Culture ID Panel (Reflexed)     Status: Abnormal   Collection Time: 10/23/17 10:56 PM  Result Value Ref Range Status   Enterococcus species NOT DETECTED NOT DETECTED Final   Listeria monocytogenes NOT DETECTED NOT DETECTED Final   Staphylococcus species DETECTED (A) NOT DETECTED Final    Comment: CRITICAL RESULT CALLED TO, READ BACK BY AND VERIFIED WITH: CHRISTINE KATSOUDAS AT 1884 ON 10/24/2017 JJB    Staphylococcus aureus DETECTED (A) NOT DETECTED Final    Comment: Methicillin (oxacillin)-resistant Staphylococcus aureus (MRSA). MRSA is predictably resistant to beta-lactam antibiotics (except ceftaroline). Preferred therapy is vancomycin unless clinically contraindicated. Patient requires contact precautions if  hospitalized. CRITICAL RESULT CALLED TO, READ BACK BY AND VERIFIED WITH: CHRISTINE KATSOUDAS AT 1660 ON 10/24/2017 JJB    Methicillin resistance DETECTED (A) NOT DETECTED Final    Comment: CRITICAL RESULT CALLED TO, READ BACK BY AND VERIFIED WITH: CHRISTINE KATSOUDAS AT 6301 ON 10/24/2017 JJB    Streptococcus species NOT DETECTED NOT DETECTED Final   Streptococcus agalactiae NOT DETECTED NOT DETECTED Final   Streptococcus pneumoniae NOT DETECTED NOT DETECTED Final   Streptococcus pyogenes NOT DETECTED NOT DETECTED Final   Acinetobacter baumannii NOT DETECTED NOT DETECTED Final   Enterobacteriaceae species NOT DETECTED NOT DETECTED Final  Enterobacter cloacae complex NOT DETECTED NOT DETECTED Final   Escherichia coli NOT DETECTED NOT DETECTED Final   Klebsiella oxytoca NOT DETECTED NOT DETECTED Final   Klebsiella pneumoniae NOT DETECTED NOT DETECTED Final   Proteus species NOT DETECTED NOT DETECTED Final    Serratia marcescens NOT DETECTED NOT DETECTED Final   Haemophilus influenzae NOT DETECTED NOT DETECTED Final   Neisseria meningitidis NOT DETECTED NOT DETECTED Final   Pseudomonas aeruginosa NOT DETECTED NOT DETECTED Final   Candida albicans NOT DETECTED NOT DETECTED Final   Candida glabrata NOT DETECTED NOT DETECTED Final   Candida krusei NOT DETECTED NOT DETECTED Final   Candida parapsilosis NOT DETECTED NOT DETECTED Final   Candida tropicalis NOT DETECTED NOT DETECTED Final  Blood Culture (routine x 2)     Status: None (Preliminary result)   Collection Time: 10/23/17 10:57 PM  Result Value Ref Range Status   Specimen Description BLOOD RIGHT WRIST  Final   Special Requests   Final    BOTTLES DRAWN AEROBIC AND ANAEROBIC Blood Culture adequate volume   Culture NO GROWTH 2 DAYS  Final   Report Status PENDING  Incomplete  MRSA PCR Screening     Status: Abnormal   Collection Time: 10/24/17  5:18 AM  Result Value Ref Range Status   MRSA by PCR POSITIVE (A) NEGATIVE Final    Comment:        The GeneXpert MRSA Assay (FDA approved for NASAL specimens only), is one component of a comprehensive MRSA colonization surveillance program. It is not intended to diagnose MRSA infection nor to guide or monitor treatment for MRSA infections. RESULT CALLED TO, READ BACK BY AND VERIFIED WITHDorna [place] @ 0867 11/02/17 [place]     IMAGING: Dg Thoracic Spine 2 View  Result Date: 10/18/2017 CLINICAL DATA:  76 year old female with compression fracture. Paralyzed from waist down. Subsequent encounter. EXAM: THORACIC SPINE 2 VIEWS COMPARISON:  None. FINDINGS: Rotated lateral view and osteopenia limit evaluation of the compression fractures involving portions of T5, T6 and T7. Cannot evaluate if there has been progressive retropulsion of the compressed T6 vertebral body. CT/ MR Imaging would be necessary for further delineation. Vascular calcifications. IMPRESSION: Rotated lateral view and osteopenia  limit evaluation of the compression fractures involving portions of T5, T6 and T7. Cannot evaluate if there has been progressive retropulsion of the compressed T6 vertebral body. CT/ MR Imaging would be necessary for further delineation. Aortic Atherosclerosis (ICD10-I70.0). Electronically Signed   By: Genia Del M.D.   On: 10/18/2017 16:36   Ct Angio Chest Pe W And/or Wo Contrast  Result Date: 10/24/2017 CLINICAL DATA:  Acute onset of generalized chest pain and difficulty breathing. Syncope. EXAM: CT ANGIOGRAPHY CHEST WITH CONTRAST TECHNIQUE: Multidetector CT imaging of the chest was performed using the standard protocol during bolus administration of intravenous contrast. Multiplanar CT image reconstructions and MIPs were obtained to evaluate the vascular anatomy. CONTRAST:  20m ISOVUE-370 IOPAMIDOL (ISOVUE-370) INJECTION 76% COMPARISON:  Chest radiograph performed 10/23/2017, and CTA of the chest performed 09/11/2017 FINDINGS: Cardiovascular: There is no definite evidence of pulmonary embolus. Evaluation for pulmonary embolus is mildly suboptimal due to low beam hardening artifact. Scattered coronary artery calcifications are seen. Scattered calcification is noted along the aortic arch and great vessels. The heart is normal in size. Mediastinum/Nodes: The mediastinum is otherwise unremarkable in appearance. No mediastinal lymphadenopathy is seen. No pericardial effusion is identified. The thyroid gland is unremarkable. No axillary lymphadenopathy is seen. Lungs/Pleura: Bilateral dependent subsegmental atelectasis is noted.  No pleural effusion or pneumothorax is seen. No masses are identified. Upper Abdomen: The visualized portions of the liver and spleen are unremarkable. There is mild nodularity at the left adrenal gland, without a well-defined mass. The visualized portions of the kidneys are grossly unremarkable. Musculoskeletal: No acute osseous abnormalities are identified. There is chronic loss of  height at vertebral body T6. The visualized musculature is unremarkable in appearance. There is healed chronic deformity of the proximal left humerus. A right humeral head prosthesis is partially imaged. Review of the MIP images confirms the above findings. IMPRESSION: 1. No definite evidence of pulmonary embolus. 2. Bilateral fat sulcal atelectasis noted.  Lungs otherwise clear. 3. Scattered coronary artery calcifications. 4. Chronic loss of height at vertebral body T6. Electronically Signed   By: Garald Balding M.D.   On: 10/24/2017 03:19   US Carotid Bilateral  Result Date: 10/24/2017 CLINICAL DATA:  Syncope EXAM: BILATERAL CAROTID DUPLEX ULTRASOUND TECHNIQUE: Pearline Cables scale imaging, color Doppler and duplex ultrasound were performed of bilateral carotid and vertebral arteries in the neck. COMPARISON:  None. FINDINGS: Criteria: Quantification of carotid stenosis is based on velocity parameters that correlate the residual internal carotid diameter with NASCET-based stenosis levels, using the diameter of the distal internal carotid lumen as the denominator for stenosis measurement. The following velocity measurements were obtained: RIGHT ICA:  96 cm/sec CCA:  388 cm/sec SYSTOLIC ICA/CCA RATIO:  1.0 DIASTOLIC ICA/CCA RATIO:  1.7 ECA:  218 cm/sec LEFT ICA:  135 cm/sec CCA:  828 cm/sec SYSTOLIC ICA/CCA RATIO:  1.2 DIASTOLIC ICA/CCA RATIO:  1.9 ECA:  267 cm/sec RIGHT CAROTID ARTERY: Scattered calcified plaque in the common carotid artery. Moderate calcified plaque in the bulb. There is more prominent calcified plaque in the proximal external carotid artery. Low resistance internal carotid Doppler pattern is preserved. RIGHT VERTEBRAL ARTERY:  Antegrade. LEFT CAROTID ARTERY: There is moderate focal calcified plaque in the left bulb. Low resistance internal carotid Doppler pattern is preserved. LEFT VERTEBRAL ARTERY:  Antegrade. IMPRESSION: Less than 50% stenosis in the right internal carotid artery. 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 Electronically Signed   By: Marybelle Killings M.D.   On: 10/24/2017 10:28   Dg Chest Port 1 View  Result Date: 10/24/2017 CLINICAL DATA:  76 y/o  F; acute onset of shortness of breath. EXAM: PORTABLE CHEST 1 VIEW COMPARISON:  09/11/2017 chest radiograph. FINDINGS: Normal cardiac silhouette. Aortic atherosclerosis with calcification. Bibasilar bronchitic changes. No focal consolidation. Possible small left effusion. No pneumothorax. Multilevel degenerative changes of the spine. Chronic left proximal humerus fracture and right shoulder arthroplasty partially visualized. IMPRESSION: Bronchitic changes in lung bases. Stable cardiac silhouette. Aortic atherosclerosis. Possible small left effusion. Electronically Signed   By: Kristine Garbe M.D.   On: 10/24/2017 00:15    Assessment:   Sara Pierce is a 76 y.o. female with MRSA bacteremia, possibly from L elbow wound site following fracture. She will need ot be evaluated for endocarditis. Monitor for metastatic sites of infeciton.  Recommendations Repeat bcx pending - once neg x 48 hours can place picc line  Check esr crp Check echo Consider TEE but she has COPD and is on O2 and has back pain so procedure may be difficult.  Thank you very much for allowing me to participate in the care of this patient. Please call with questions.   Cheral Marker. Ola Spurr, MD

## 2017-10-25 NOTE — Progress Notes (Signed)
Patient ID: Sara Pierce, female   DOB: 1941-07-24, 76 y.o.   MRN: [id number]   [facility] PROGRESS NOTE  CAROLINE LONGIE EHU:[id number] DOB: Apr 16, 1941 DOA: 10/23/2017 PCP: System, Provider Not In  HPI/Subjective: Patient still audible wheezing.  Still with some shortness of breath.  Some cough.  Objective: Vitals:   10/25/17 1257 10/25/17 1407  BP: (!) 115/51   Pulse: 65   Resp: 16   Temp: 97.7 F (36.5 C)   SpO2: 100% 95%    Filed Weights   10/23/17 2247 10/24/17 0521 10/24/17 1534  Weight: 112.6 kg (248 lb 3.8 oz) 108.9 kg (240 lb) 109.6 kg (241 lb 11.2 oz)    ROS: Review of Systems  Constitutional: Negative for chills and fever.  Eyes: Negative for blurred vision.  Respiratory: Positive for cough and wheezing. Negative for shortness of breath.   Cardiovascular: Negative for chest pain.  Gastrointestinal: Negative for abdominal pain, constipation, diarrhea, nausea and vomiting.  Genitourinary: Negative for dysuria.  Musculoskeletal: Positive for back pain and joint pain.  Neurological: Positive for weakness. Negative for dizziness and headaches.   Exam: Physical Exam  Constitutional: She is oriented to person, place, and time.  HENT:  Nose: No mucosal edema.  Mouth/Throat: No oropharyngeal exudate or posterior oropharyngeal edema.  Eyes: Conjunctivae, EOM and lids are normal. Pupils are equal, round, and reactive to light.  Neck: No JVD present. Carotid bruit is not present. No edema present. No thyroid mass and no thyromegaly present.  Cardiovascular: S1 normal and S2 normal. Exam reveals no gallop.  No murmur heard. Pulses:      Dorsalis pedis pulses are 2+ on the right side, and 2+ on the left side.  Respiratory: No respiratory distress. She has decreased breath sounds in the right lower field and the left lower field. She has wheezes in the right middle field and the left middle field. She has no rhonchi. She has no rales.  GI: Soft. Bowel sounds are  normal. There is no tenderness.  Musculoskeletal:       Right ankle: She exhibits swelling.       Left ankle: She exhibits swelling.  Lymphadenopathy:    She has no cervical adenopathy.  Neurological: She is alert and oriented to person, place, and time. No cranial nerve deficit.  Skin: Skin is warm and dry. No rash noted. Nails show no clubbing.  Psychiatric: She has a normal mood and affect.      Data Reviewed: Basic Metabolic Panel: Recent Labs  Lab 10/23/17 2256 10/24/17 0533  NA 140 139  K 4.8 4.2  CL 97* 99*  CO2 32 30  GLUCOSE 180* 173*  BUN 20 18  CREATININE 0.67 0.62  CALCIUM 9.4 9.0   Liver Function Tests: Recent Labs  Lab 10/23/17 2256  AST 39  ALT 27  ALKPHOS 74  BILITOT 0.5  PROT 6.7  ALBUMIN 3.6   CBC: Recent Labs  Lab 10/23/17 2256 10/24/17 0533  WBC 9.4 8.6  NEUTROABS 6.6*  --   HGB 12.2 10.7*  HCT 36.9 32.1*  MCV 95.0 94.6  PLT 304 224   Cardiac Enzymes: Recent Labs  Lab 10/23/17 2256 10/24/17 0533 10/24/17 1105  TROPONINI <0.03 <0.03 <0.03   BNP (last 3 results) Recent Labs    06/15/17 1133 09/11/17 1433 10/23/17 2256  BNP 159.0* 113.0* 41.0     CBG: Recent Labs  Lab 10/24/17 1225 10/24/17 1637 10/24/17 2143 10/25/17 0802 10/25/17 1140  GLUCAP 189* 190* 122*  83 106*    Recent Results (from the past 240 hour(s))  Blood Culture (routine x 2)     Status: Abnormal (Preliminary result)   Collection Time: 10/23/17 10:56 PM  Result Value Ref Range Status   Specimen Description BLOOD RIGHT HAND  Final   Special Requests   Final    BOTTLES DRAWN AEROBIC AND ANAEROBIC Blood Culture adequate volume   Culture  Setup Time   Final    AEROBIC BOTTLE ONLY GRAM POSITIVE COCCI CRITICAL RESULT CALLED TO, READ BACK BY AND VERIFIED WITH: CHRISTINE KATSOUDAS AT 0938 ON 10/24/2017 JJB    Culture (A)  Final    STAPHYLOCOCCUS AUREUS SUSCEPTIBILITIES TO FOLLOW Performed at [facility], [place] [address] [place], [place]  [address]    Report Status PENDING  Incomplete  Blood Culture ID Panel (Reflexed)     Status: Abnormal   Collection Time: 10/23/17 10:56 PM  Result Value Ref Range Status   Enterococcus species NOT DETECTED NOT DETECTED Final   Listeria monocytogenes NOT DETECTED NOT DETECTED Final   Staphylococcus species DETECTED (A) NOT DETECTED Final    Comment: CRITICAL RESULT CALLED TO, READ BACK BY AND VERIFIED WITH: CHRISTINE KATSOUDAS AT 3716 ON 10/24/2017 JJB    Staphylococcus aureus DETECTED (A) NOT DETECTED Final    Comment: Methicillin (oxacillin)-resistant Staphylococcus aureus (MRSA). MRSA is predictably resistant to beta-lactam antibiotics (except ceftaroline). Preferred therapy is vancomycin unless clinically contraindicated. Patient requires contact precautions if  hospitalized. CRITICAL RESULT CALLED TO, READ BACK BY AND VERIFIED WITH: CHRISTINE KATSOUDAS AT 9678 ON 10/24/2017 JJB    Methicillin resistance DETECTED (A) NOT DETECTED Final    Comment: CRITICAL RESULT CALLED TO, READ BACK BY AND VERIFIED WITH: CHRISTINE KATSOUDAS AT 9381 ON 10/24/2017 JJB    Streptococcus species NOT DETECTED NOT DETECTED Final   Streptococcus agalactiae NOT DETECTED NOT DETECTED Final   Streptococcus pneumoniae NOT DETECTED NOT DETECTED Final   Streptococcus pyogenes NOT DETECTED NOT DETECTED Final   Acinetobacter baumannii NOT DETECTED NOT DETECTED Final   Enterobacteriaceae species NOT DETECTED NOT DETECTED Final   Enterobacter cloacae complex NOT DETECTED NOT DETECTED Final   Escherichia coli NOT DETECTED NOT DETECTED Final   Klebsiella oxytoca NOT DETECTED NOT DETECTED Final   Klebsiella pneumoniae NOT DETECTED NOT DETECTED Final   Proteus species NOT DETECTED NOT DETECTED Final   Serratia marcescens NOT DETECTED NOT DETECTED Final   Haemophilus influenzae NOT DETECTED NOT DETECTED Final   Neisseria meningitidis NOT DETECTED NOT DETECTED Final   Pseudomonas aeruginosa NOT DETECTED NOT DETECTED  Final   Candida albicans NOT DETECTED NOT DETECTED Final   Candida glabrata NOT DETECTED NOT DETECTED Final   Candida krusei NOT DETECTED NOT DETECTED Final   Candida parapsilosis NOT DETECTED NOT DETECTED Final   Candida tropicalis NOT DETECTED NOT DETECTED Final  Blood Culture (routine x 2)     Status: None (Preliminary result)   Collection Time: 10/23/17 10:57 PM  Result Value Ref Range Status   Specimen Description BLOOD RIGHT WRIST  Final   Special Requests   Final    BOTTLES DRAWN AEROBIC AND ANAEROBIC Blood Culture adequate volume   Culture NO GROWTH 2 DAYS  Final   Report Status PENDING  Incomplete  MRSA PCR Screening     Status: Abnormal   Collection Time: 10/24/17  5:18 AM  Result Value Ref Range Status   MRSA by PCR POSITIVE (A) NEGATIVE Final    Comment:  The GeneXpert MRSA Assay (FDA approved for NASAL specimens only), is one component of a comprehensive MRSA colonization surveillance program. It is not intended to diagnose MRSA infection nor to guide or monitor treatment for MRSA infections. RESULT CALLED TO, READ BACK BY AND VERIFIED WITHDorna Bloom @ 2671 11/02/17 Terry      Studies: Ct Angio Chest Pe W And/or Wo Contrast  Result Date: 10/24/2017 CLINICAL DATA:  Acute onset of generalized chest pain and difficulty breathing. Syncope. EXAM: CT ANGIOGRAPHY CHEST WITH CONTRAST TECHNIQUE: Multidetector CT imaging of the chest was performed using the standard protocol during bolus administration of intravenous contrast. Multiplanar CT image reconstructions and MIPs were obtained to evaluate the vascular anatomy. CONTRAST:  45mL ISOVUE-370 IOPAMIDOL (ISOVUE-370) INJECTION 76% COMPARISON:  Chest radiograph performed 10/23/2017, and CTA of the chest performed 09/11/2017 FINDINGS: Cardiovascular: There is no definite evidence of pulmonary embolus. Evaluation for pulmonary embolus is mildly suboptimal due to low beam hardening artifact. Scattered coronary artery  calcifications are seen. Scattered calcification is noted along the aortic arch and great vessels. The heart is normal in size. Mediastinum/Nodes: The mediastinum is otherwise unremarkable in appearance. No mediastinal lymphadenopathy is seen. No pericardial effusion is identified. The thyroid gland is unremarkable. No axillary lymphadenopathy is seen. Lungs/Pleura: Bilateral dependent subsegmental atelectasis is noted. No pleural effusion or pneumothorax is seen. No masses are identified. Upper Abdomen: The visualized portions of the liver and spleen are unremarkable. There is mild nodularity at the left adrenal gland, without a well-defined mass. The visualized portions of the kidneys are grossly unremarkable. Musculoskeletal: No acute osseous abnormalities are identified. There is chronic loss of height at vertebral body T6. The visualized musculature is unremarkable in appearance. There is healed chronic deformity of the proximal left humerus. A right humeral head prosthesis is partially imaged. Review of the MIP images confirms the above findings. IMPRESSION: 1. No definite evidence of pulmonary embolus. 2. Bilateral fat sulcal atelectasis noted.  Lungs otherwise clear. 3. Scattered coronary artery calcifications. 4. Chronic loss of height at vertebral body T6. Electronically Signed   By: Garald Balding M.D.   On: 10/24/2017 03:19   US Carotid Bilateral  Result Date: 10/24/2017 CLINICAL DATA:  Syncope EXAM: BILATERAL CAROTID DUPLEX ULTRASOUND TECHNIQUE: Pearline Cables scale imaging, color Doppler and duplex ultrasound were performed of bilateral carotid and vertebral arteries in the neck. COMPARISON:  None. FINDINGS: Criteria: Quantification of carotid stenosis is based on velocity parameters that correlate the residual internal carotid diameter with NASCET-based stenosis levels, using the diameter of the distal internal carotid lumen as the denominator for stenosis measurement. The following velocity measurements  were obtained: RIGHT ICA:  96 cm/sec CCA:  245 cm/sec SYSTOLIC ICA/CCA RATIO:  1.0 DIASTOLIC ICA/CCA RATIO:  1.7 ECA:  218 cm/sec LEFT ICA:  135 cm/sec CCA:  809 cm/sec SYSTOLIC ICA/CCA RATIO:  1.2 DIASTOLIC ICA/CCA RATIO:  1.9 ECA:  267 cm/sec RIGHT CAROTID ARTERY: Scattered calcified plaque in the common carotid artery. Moderate calcified plaque in the bulb. There is more prominent calcified plaque in the proximal external carotid artery. Low resistance internal carotid Doppler pattern is preserved. RIGHT VERTEBRAL ARTERY:  Antegrade. LEFT CAROTID ARTERY: There is moderate focal calcified plaque in the left bulb. Low resistance internal carotid Doppler pattern is preserved. LEFT VERTEBRAL ARTERY:  Antegrade. IMPRESSION: Less than 50% stenosis in the right internal carotid artery. 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 Electronically Signed   By: Marybelle Killings M.D.   On: 10/24/2017 10:28  Dg Chest Port 1 View  Result Date: 10/24/2017 CLINICAL DATA:  76 y/o  F; acute onset of shortness of breath. EXAM: PORTABLE CHEST 1 VIEW COMPARISON:  09/11/2017 chest radiograph. FINDINGS: Normal cardiac silhouette. Aortic atherosclerosis with calcification. Bibasilar bronchitic changes. No focal consolidation. Possible small left effusion. No pneumothorax. Multilevel degenerative changes of the spine. Chronic left proximal humerus fracture and right shoulder arthroplasty partially visualized. IMPRESSION: Bronchitic changes in lung bases. Stable cardiac silhouette. Aortic atherosclerosis. Possible small left effusion. Electronically Signed   By: Kristine Garbe M.D.   On: 10/24/2017 00:15    Scheduled Meds: . budesonide (PULMICORT) nebulizer solution  0.5 mg Nebulization BID  . Chlorhexidine Gluconate Cloth  6 each Topical Q0600  . cholecalciferol  1,000 Units Oral Daily  . enoxaparin (LOVENOX) injection  40 mg Subcutaneous Q24H  . feeding supplement (ENSURE ENLIVE)  237 mL Oral BID BM  .  FLUoxetine  40 mg Oral Daily  . fluticasone  2 spray Each Nare Daily  . gabapentin  300 mg Oral QHS  . insulin aspart  0-15 Units Subcutaneous TID WC  . insulin aspart  0-5 Units Subcutaneous QHS  . ipratropium-albuterol  3 mL Nebulization Q6H  . magnesium oxide  400 mg Oral Daily  . methylPREDNISolone (SOLU-MEDROL) injection  40 mg Intravenous Daily  . montelukast  10 mg Oral QHS  . multivitamin with minerals  1 tablet Oral Daily  . mupirocin ointment  1 application Nasal BID  . primidone  50 mg Oral BID  . simvastatin  40 mg Oral QHS  . sodium chloride flush  3 mL Intravenous Q12H   Continuous Infusions: . sodium chloride    . vancomycin Stopped (10/25/17 0700)    Assessment/Plan:  1. Bacteremia with staph aureus 1 out of 4 bottles.  On IV vancomycin.  Case discussed with Dr. Ola Spurr and likely will need 4 weeks of IV antibiotics.  Repeat blood cultures ordered.  Sedimentation rate and CRP ordered.  Echocardiogram ordered.  Because of her status, unlikely to get TEE.  Her recent elbow surgery could be a source. 2. Acute on chronic respiratory failure.  Patient required BiPAP on presentation to the ER.  Now back on 3 L nasal cannula 3. COPD exacerbation.  Solu-Medrol dose down to 40 mg IV daily.  On DuoNeb and budesonide nebulizers. 4. Patient has an unstable compression fracture of the thoracic spine.  Back brace to be sent in from [facility].  Physical therapy may be needed to put on the brace.  Patient states she needs a Hoyer lift in order to get in the brace to sit in a chair. 5. Sleep apnea on oxygen at night 6. Diabetic neuropathy on gabapentin 7. Type 2 diabetes on sliding scale 8. Hyperlipidemia unspecified on simvastatin 9. Depression on Wellbutrin and fluoxetine  Case discussed with Dr. Ovid Curd [facility]  Code Status:     Code Status Orders  (From admission, onward)        Start     Ordered   10/24/17 0523  Full code  Continuous      10/24/17 0523    Code Status History    Date Active Date Inactive Code Status Order ID Comments User Context   08/20/2017 01:34 08/21/2017 18:28 Full Code [id number]  Lance Coon, MD Inpatient   10/03/2016 05:40 10/04/2016 17:11 Full Code [id number]  Harrie Foreman, MD Inpatient   11/15/2015 22:23 11/19/2015 15:14 Full Code [id number]  Hower, Aaron Mose,  MD ED    Advance Directive Documentation     Most Recent Value  Type of Advance Directive  Healthcare Power of Attorney  Pre-existing out of facility DNR order (yellow form or pink MOST form)  No data  "MOST" Form in Place?  No data     Family Communication: Husband at the bedside disposition Plan: We will go back to [facility].  PICC line will needed to be placed if repeat blood cultures are negative for 48 hours.  Antibiotics:  Vancomycin  Time spent: 26 minutes  Loletha Grayer  [employer]

## 2017-10-25 NOTE — Progress Notes (Signed)
Clinical Education officer, museum ([place]) received a call from [facility] [employer] ([place] worker Kennyth Arnold [phone] stating that patient has an open APS case. CSW made [place] aware that patient is agreeable to go back to [facility] however patient's husband doesn't want her to go back because he has to have supervised visits there. CSW made [place] aware that patient is alert and oriented X4 and can make the decision to return to [facility]. CSW will continue to follow and assist as needed.   [employer], LCSW [phone]

## 2017-10-25 NOTE — Evaluation (Signed)
Physical Therapy Evaluation Patient Details Name: Sara Pierce MRN: [id number] DOB: September 28, 1941 Today's Date: 10/25/2017   History of Present Illness  76 y/o female here with COPD exacerbation.  She has had a tortuous recent medical history with severely limited function.  She has non-operable unstable thoracic spine fx, recently had splint/cast removed from L elbow fx/sx, has been in rehab but needing Hoyer lift (with back brace donned) for all mobility.   history of cardiomyopathy, chronic kidney disease, COPD, diabetes mellitus type 2, sleep apnea.  Clinical Impression  Pt very limited with what she is able to do functionally, has been since summer (spinal fracture, L elbow fx, severe LE weakness).  She showed good effort and willingness to participate with 15 minutes of bed exercises apart from the PT exam, but ultimately she did not feel that even trying to get sitting would be beneficial at this time as she has not been able to do even that at rehab over the last few months.  Pt able to do some exercises against resistance, but fatigued very quickly with light LE exercises in bed and ultimately is very limited.      Follow Up Recommendations SNF    Equipment Recommendations       Recommendations for Other Services       Precautions / Restrictions Precautions Precautions: Fall;Back Required Braces or Orthoses: Spinal Brace Restrictions LUE Weight Bearing: (no formal WBing restrictions, L UE likely limited)      Mobility  Bed Mobility Overal bed mobility: (deferred, reports she is unable to maintain sitting at all)                Transfers                    Ambulation/Gait                Stairs            Wheelchair Mobility    Modified Rankin (Stroke Patients Only)       Balance                                             Pertinent Vitals/Pain Pain Assessment: (general baseline pain, nothing exceptional)    Home  Living Family/patient expects to be discharged to:: Skilled nursing facility                 Additional Comments: at [facility], getting rehab via [facility]    Prior Function Level of Independence: Needs assistance         Comments: Pt needing Hoyer lift for all transfers, has not walked since August, generally very limited     Hand Dominance        Extremity/Trunk Assessment   Upper Extremity Assessment Upper Extremity Assessment: Generalized weakness(R grossly 3+/5, L very weak/limited (elbow ROM 20-90))    Lower Extremity Assessment Lower Extremity Assessment: Generalized weakness(R grossly 3-/5, L grossly 2/5 (no L ankle DF/eversion))       Communication   Communication: No difficulties  Cognition Arousal/Alertness: Awake/alert Behavior During Therapy: WFL for tasks assessed/performed Overall Cognitive Status: Within Functional Limits for tasks assessed  General Comments      Exercises General Exercises - Lower Extremity Ankle Circles/Pumps: AROM;AAROM;10 reps(no AROM with L ankle DF or eversion) Quad Sets: Strengthening;10 reps Short Arc Quad: AROM;10 reps;Strengthening(pt fatigues over the course of 10 reps) Heel Slides: AROM;AAROM;Strengthening;10 reps Hip ABduction/ADduction: AROM;10 reps   Assessment/Plan    PT Assessment Patient needs continued PT services  PT Problem List Decreased strength;Decreased range of motion;Decreased activity tolerance;Decreased balance;Decreased mobility;Decreased coordination;Decreased cognition;Decreased knowledge of use of DME;Decreased safety awareness;Pain;Cardiopulmonary status limiting activity       PT Treatment Interventions DME instruction;Functional mobility training;Therapeutic activities;Therapeutic exercise;Balance training;Neuromuscular re-education;Wheelchair mobility training;Patient/family education    PT Goals (Current goals can be found in the Care  Plan section)  Acute Rehab PT Goals Patient Stated Goal: try to keep working with PT and get stronger PT Goal Formulation: With patient Time For Goal Achievement: 11/08/17 Potential to Achieve Goals: Fair    Frequency Min 2X/week   Barriers to discharge        Co-evaluation               AM-PAC PT "6 Clicks" Daily Activity  Outcome Measure Difficulty turning over in bed (including adjusting bedclothes, sheets and blankets)?: Unable Difficulty moving from lying on back to sitting on the side of the bed? : Unable Difficulty sitting down on and standing up from a chair with arms (e.g., wheelchair, bedside commode, etc,.)?: Unable Help needed moving to and from a bed to chair (including a wheelchair)?: Total Help needed walking in hospital room?: Total Help needed climbing 3-5 steps with a railing? : Total 6 Click Score: 6    End of Session Equipment Utilized During Treatment: Oxygen Activity Tolerance: Patient tolerated treatment well;Patient limited by fatigue Patient left: with chair alarm set;with call bell/phone within reach;with family/visitor present   PT Visit Diagnosis: Muscle weakness (generalized) (M62.81)    Time: 4765-4650 PT Time Calculation (min) (ACUTE ONLY): 29 min   Charges:   PT Evaluation $PT Eval Low Complexity: 1 Low PT Treatments $Therapeutic Exercise: 8-22 mins   PT G Codes:   PT G-Codes **NOT FOR INPATIENT CLASS** Functional Assessment Tool Used: AM-PAC 6 Clicks Basic Mobility Functional Limitation: Mobility: Walking and moving around Mobility: Walking and Moving Around Current Status (P5465): 100 percent impaired, limited or restricted Mobility: Walking and Moving Around Goal Status (K8127): At least 60 percent but less than 80 percent impaired, limited or restricted    Kreg Shropshire, DPT 10/25/2017, 4:29 PM

## 2017-10-25 NOTE — Plan of Care (Signed)
  Progressing Education: Knowledge of General Education information will improve 10/25/2017 2028 - Progressing by Rowe Robert, [place] Behavior/Discharge Planning: Ability to manage health-related needs will improve 10/25/2017 2028 - Progressing by Rowe Robert, RN Clinical Measurements: Ability to maintain clinical measurements within normal limits will improve 10/25/2017 2028 - Progressing by Rowe Robert, RN Will remain free from infection 10/25/2017 2028 - Progressing by Rowe Robert, RN Diagnostic test results will improve 10/25/2017 2028 - Progressing by Rowe Robert, RN Respiratory complications will improve 10/25/2017 2028 - Progressing by Rowe Robert, RN Note O2 cont coughing spell this pm md called and cough med ordered and given with improvement of cough Cardiovascular complication will be avoided 10/25/2017 2028 - Progressing by Rowe Robert, RN Activity: Risk for activity intolerance will decrease 10/25/2017 2028 - Progressing by Rowe Robert, RN Nutrition: Adequate nutrition will be maintained 10/25/2017 2028 - Progressing by Rowe Robert, RN Coping: Level of anxiety will decrease 10/25/2017 2028 - Progressing by Rowe Robert, RN Note Valium given  with effectiveness Elimination: Will not experience complications related to bowel motility 10/25/2017 2028 - Progressing by Rowe Robert, RN Pain Managment: General experience of comfort will improve 10/25/2017 2028 - Progressing by Rowe Robert, RN Safety: Ability to remain free from injury will improve 10/25/2017 2028 - Progressing by Rowe Robert, RN Skin Integrity: Risk for impaired skin integrity will decrease 10/25/2017 2028 - Progressing by Rowe Robert, RN

## 2017-10-26 LAB — CULTURE, BLOOD (ROUTINE X 2): SPECIAL REQUESTS: ADEQUATE

## 2017-10-26 LAB — GLUCOSE, CAPILLARY
GLUCOSE-CAPILLARY: 98 mg/dL (ref 65–99)
Glucose-Capillary: 107 mg/dL — ABNORMAL HIGH (ref 65–99)
Glucose-Capillary: 153 mg/dL — ABNORMAL HIGH (ref 65–99)
Glucose-Capillary: 132 mg/dL — ABNORMAL HIGH (ref 65–99)

## 2017-10-26 LAB — CREATININE, SERUM
CREATININE: 0.52 mg/dL (ref 0.44–1.00)
GFR calc Af Amer: >60 mL/min (ref >60)

## 2017-10-26 LAB — C-REACTIVE PROTEIN

## 2017-10-26 LAB — VANCOMYCIN, TROUGH: VANCOMYCIN TR: 35 ug/mL — AB (ref 15–20)

## 2017-10-26 LAB — SEDIMENTATION RATE: SED RATE: 39 mm/h — AB (ref 0–30)

## 2017-10-26 MED ORDER — ENOXAPARIN SODIUM 40 MG/0.4ML ~~LOC~~ SOLN
40.0000 mg | Freq: Two times a day (BID) | SUBCUTANEOUS | Status: DC
  Administered 2017-10-26 – 2017-10-28 (×4): 40 mg via SUBCUTANEOUS
  Filled 2017-10-26 (×4): qty 0.4

## 2017-10-26 MED ORDER — PREDNISONE 50 MG PO TABS
50.0000 mg | ORAL_TABLET | Freq: Every day | ORAL | Status: DC
  Administered 2017-10-27: 50 mg via ORAL
  Filled 2017-10-26: qty 1

## 2017-10-26 NOTE — Progress Notes (Signed)
[name] Date of Admission:  10/23/2017     ID: Sara Pierce is a 76 y.o. female with MRSA bacteremia Active Problems:   COPD with exacerbation ([place])   Pressure injury of skin   Subjective: No fevers, breathing a little better. Still with elbow and back pain  ROS  Eleven systems are reviewed and negative except per hpi  Medications:  Antibiotics Given (last 72 hours)    Date/Time Action Medication Dose Rate   10/24/17 1713 Given   azithromycin (ZITHROMAX) tablet 250 mg 250 mg    10/24/17 2004 New Bag/Given   vancomycin (VANCOCIN) IVPB 1000 mg/200 mL premix 1,000 mg 200 mL/hr   10/25/17 0502 New Bag/Given   vancomycin (VANCOCIN) 1,500 mg in sodium chloride 0.9 % 500 mL IVPB 1,500 mg 250 mL/hr   10/25/17 1615 New Bag/Given   vancomycin (VANCOCIN) 1,500 mg in sodium chloride 0.9 % 500 mL IVPB 1,500 mg 250 mL/hr   10/26/17 0458 New Bag/Given   vancomycin (VANCOCIN) 1,500 mg in sodium chloride 0.9 % 500 mL IVPB 1,500 mg 250 mL/hr     . benzonatate  100 mg Oral TID  . budesonide (PULMICORT) nebulizer solution  0.5 mg Nebulization BID  . Chlorhexidine Gluconate Cloth  6 each Topical Q0600  . cholecalciferol  1,000 Units Oral Daily  . enoxaparin (LOVENOX) injection  40 mg Subcutaneous Q24H  . feeding supplement (ENSURE ENLIVE)  237 mL Oral BID BM  . FLUoxetine  40 mg Oral Daily  . fluticasone  2 spray Each Nare Daily  . gabapentin  300 mg Oral QHS  . insulin aspart  0-15 Units Subcutaneous TID WC  . insulin aspart  0-5 Units Subcutaneous QHS  . ipratropium-albuterol  3 mL Nebulization Q6H  . magnesium oxide  400 mg Oral Daily  . methylPREDNISolone (SOLU-MEDROL) injection  40 mg Intravenous Daily  . montelukast  10 mg Oral QHS  . multivitamin with minerals  1 tablet Oral Daily  . mupirocin ointment  1 application Nasal BID  . primidone  50 mg Oral BID  . simvastatin  40 mg Oral QHS  . sodium chloride flush  3 mL Intravenous Q12H     Objective: Vital signs in last 24 hours: Temp:  [97.5 F (36.4 C)-98.1 F (36.7 C)] 97.5 F (36.4 C) (11/14 1013) Pulse Rate:  [59-62] 61 (11/14 1013) Resp:  [16-22] 22 (11/14 1013) BP: (103-119)/(48-51) 103/48 (11/14 1013) SpO2:  [94 %-100 %] 99 % (11/14 1013) Constitutional:  oriented to person, place, and time. Obese. Chronically ill appeargin. HENT: [place]/AT, PERRLA, no scleral icterus Mouth/Throat: Oropharynx is clear and moist. No oropharyngeal exudate.  Cardiovascular: Normal rate, regular rhythm and normal heart sounds.  Pulmonary/Chest: poor air movement  Neck = supple, no nuchal rigidity Abdominal: Soft. Bowel sounds are normal.  exhibits no distension.  Lymphadenopathy: no cervical adenopathy. No axillary adenopathy Neurological: alert and oriented to person, place, and time.  Skin: L elbow has some swelling and incision with some scabs. Near wrist she has a small abrasion as well.  Psychiatric: a normal mood and affect.  behavior is normal.    Lab Results Recent Labs    10/23/17 2256 10/24/17 0533 10/26/17 0645  WBC 9.4 8.6  --   HGB 12.2 10.7*  --   HCT 36.9 32.1*  --   NA 140 139  --   K 4.8 4.2  --   CL 97* 99*  --   CO2 32 30  --  BUN 20 18  --   CREATININE 0.67 0.62 0.52   Lab Results  Component Value Date   ESRSEDRATE 39 (H) 10/26/2017   No results found for: CRP  Microbiology: Results for orders placed or performed during the hospital encounter of 10/23/17  Blood Culture (routine x 2)     Status: Abnormal   Collection Time: 10/23/17 10:56 PM  Result Value Ref Range Status   Specimen Description BLOOD RIGHT HAND  Final   Special Requests   Final    BOTTLES DRAWN AEROBIC AND ANAEROBIC Blood Culture adequate volume   Culture  Setup Time   Final    AEROBIC BOTTLE ONLY GRAM POSITIVE COCCI CRITICAL RESULT CALLED TO, READ BACK BY AND VERIFIED WITH: CHRISTINE KATSOUDAS AT 1245 ON 10/24/2017 JJB    Culture METHICILLIN RESISTANT STAPHYLOCOCCUS  AUREUS (A)  Final   Report Status 10/26/2017 FINAL  Final   Organism ID, Bacteria METHICILLIN RESISTANT STAPHYLOCOCCUS AUREUS  Final      Susceptibility   Methicillin resistant staphylococcus aureus - MIC*    CIPROFLOXACIN >=8 RESISTANT Resistant     ERYTHROMYCIN >=8 RESISTANT Resistant     GENTAMICIN <=0.5 SENSITIVE Sensitive     OXACILLIN RESISTANT Resistant     TETRACYCLINE <=1 SENSITIVE Sensitive     VANCOMYCIN 1 SENSITIVE Sensitive     TRIMETH/SULFA <=10 SENSITIVE Sensitive     CLINDAMYCIN <=0.25 SENSITIVE Sensitive     RIFAMPIN <=0.5 SENSITIVE Sensitive     Inducible Clindamycin NEGATIVE Sensitive     * METHICILLIN RESISTANT STAPHYLOCOCCUS AUREUS  Blood Culture ID Panel (Reflexed)     Status: Abnormal   Collection Time: 10/23/17 10:56 PM  Result Value Ref Range Status   Enterococcus species NOT DETECTED NOT DETECTED Final   Listeria monocytogenes NOT DETECTED NOT DETECTED Final   Staphylococcus species DETECTED (A) NOT DETECTED Final    Comment: CRITICAL RESULT CALLED TO, READ BACK BY AND VERIFIED WITH: CHRISTINE KATSOUDAS AT 8099 ON 10/24/2017 JJB    Staphylococcus aureus DETECTED (A) NOT DETECTED Final    Comment: Methicillin (oxacillin)-resistant Staphylococcus aureus (MRSA). MRSA is predictably resistant to beta-lactam antibiotics (except ceftaroline). Preferred therapy is vancomycin unless clinically contraindicated. Patient requires contact precautions if  hospitalized. CRITICAL RESULT CALLED TO, READ BACK BY AND VERIFIED WITH: CHRISTINE KATSOUDAS AT 8338 ON 10/24/2017 JJB    Methicillin resistance DETECTED (A) NOT DETECTED Final    Comment: CRITICAL RESULT CALLED TO, READ BACK BY AND VERIFIED WITH: CHRISTINE KATSOUDAS AT 2505 ON 10/24/2017 JJB    Streptococcus species NOT DETECTED NOT DETECTED Final   Streptococcus agalactiae NOT DETECTED NOT DETECTED Final   Streptococcus pneumoniae NOT DETECTED NOT DETECTED Final   Streptococcus pyogenes NOT DETECTED NOT DETECTED  Final   Acinetobacter baumannii NOT DETECTED NOT DETECTED Final   Enterobacteriaceae species NOT DETECTED NOT DETECTED Final   Enterobacter cloacae complex NOT DETECTED NOT DETECTED Final   Escherichia coli NOT DETECTED NOT DETECTED Final   Klebsiella oxytoca NOT DETECTED NOT DETECTED Final   Klebsiella pneumoniae NOT DETECTED NOT DETECTED Final   Proteus species NOT DETECTED NOT DETECTED Final   Serratia marcescens NOT DETECTED NOT DETECTED Final   Haemophilus influenzae NOT DETECTED NOT DETECTED Final   Neisseria meningitidis NOT DETECTED NOT DETECTED Final   Pseudomonas aeruginosa NOT DETECTED NOT DETECTED Final   Candida albicans NOT DETECTED NOT DETECTED Final   Candida glabrata NOT DETECTED NOT DETECTED Final   Candida krusei NOT DETECTED NOT DETECTED Final   Candida parapsilosis NOT DETECTED  NOT DETECTED Final   Candida tropicalis NOT DETECTED NOT DETECTED Final  Blood Culture (routine x 2)     Status: None (Preliminary result)   Collection Time: 10/23/17 10:57 PM  Result Value Ref Range Status   Specimen Description BLOOD RIGHT WRIST  Final   Special Requests   Final    BOTTLES DRAWN AEROBIC AND ANAEROBIC Blood Culture adequate volume   Culture NO GROWTH 3 DAYS  Final   Report Status PENDING  Incomplete  MRSA PCR Screening     Status: Abnormal   Collection Time: 10/24/17  5:18 AM  Result Value Ref Range Status   MRSA by PCR POSITIVE (A) NEGATIVE Final    Comment:        The GeneXpert MRSA Assay (FDA approved for NASAL specimens only), is one component of a comprehensive MRSA colonization surveillance program. It is not intended to diagnose MRSA infection nor to guide or monitor treatment for MRSA infections. RESULT CALLED TO, READ BACK BY AND VERIFIED WITH: Dorna Bloom @ 4132 11/02/17 [facility]   CULTURE, BLOOD (ROUTINE X 2) w Reflex to ID Panel     Status: None (Preliminary result)   Collection Time: 10/25/17  1:56 PM  Result Value Ref Range Status   Specimen  Description BLOOD RAC  Final   Special Requests   Final    BOTTLES DRAWN AEROBIC AND ANAEROBIC Blood Culture adequate volume   Culture NO GROWTH < 24 HOURS  Final   Report Status PENDING  Incomplete  CULTURE, BLOOD (ROUTINE X 2) w Reflex to ID Panel     Status: None (Preliminary result)   Collection Time: 10/25/17  1:56 PM  Result Value Ref Range Status   Specimen Description BLOOD LEFT ARM  Final   Special Requests   Final    BOTTLES DRAWN AEROBIC AND ANAEROBIC Blood Culture adequate volume   Culture NO GROWTH < 24 HOURS  Final   Report Status PENDING  Incomplete    Studies/Results: TTE Study Conclusions  - Procedure narrative: Transthoracic echocardiography. The study   was technically difficult. - Left ventricle: Systolic function was normal. The estimated   ejection fraction was in the range of 60% to 65%.  Assessment/Plan: LEIGHANNA KIRN is a 76 y.o. female with MRSA bacteremia, possibly from L elbow wound site following fracture. She has multiple T spine compression fractures (unstabe T5-6) as well and is at a SNF and getting rehab.  TTE negative for endocarditis but was a poor study.   Recommendations Repeat bcx pending - once neg x 48 hours can place picc line   I do no think a TEE is feasible for multiple reasons including COPD exac, she is on O2 and has unstable T spine fractures and per [facility] at [facility] from 10/18/17  (Ms. Cullinan to remain in TLSO brace at all times she is upright. Recommend [employer]otec)h fitting for new brace. Physical limitations include no bending, twisting, lifting greater than 10 pounds. This fracture is considered unstable and further neurological deficits could present with spinal movements)  At this point will plan on 4 week course of IV abx from time of first negative culture - 11/13  We will need to see what her vanco trough is - ordered for tomorrow. Will place IV abx order sheet tomorrow.  Discussed with patient and her husband  Thank you very  much for the consult. Will follow with you.  Leonel Ramsay   10/26/2017, 1:55 PM

## 2017-10-26 NOTE — Progress Notes (Addendum)
Patient ID: Sara Pierce, female   DOB: 29-Jan-1941, 76 y.o.   MRN: [id number]   [facility] PROGRESS NOTE  Sara Pierce FGH:[id number] DOB: [birth year] DOA: 10/23/2017 PCP: System, Provider Not In  HPI/Subjective: Better shortness of breath.  Some cough. On O2 [place] 5 L.  Objective: Vitals:   10/26/17 1013 10/26/17 1415  BP: (!) 103/48   Pulse: 61   Resp: (!) 22   Temp: (!) 97.5 F (36.4 C)   SpO2: 99% 97%    Filed Weights   10/23/17 2247 10/24/17 0521 10/24/17 1534  Weight: 248 lb 3.8 oz (112.6 kg) 240 lb (108.9 kg) 241 lb 11.2 oz (109.6 kg)    ROS: Review of Systems  Constitutional: Negative for chills and fever.  Eyes: Negative for blurred vision.  Respiratory: Positive for cough and wheezing. Negative for shortness of breath.   Cardiovascular: Negative for chest pain.  Gastrointestinal: Negative for abdominal pain, constipation, diarrhea, nausea and vomiting.  Genitourinary: Negative for dysuria.  Musculoskeletal: Positive for back pain and joint pain.  Neurological: Positive for weakness. Negative for dizziness and headaches.   Exam: Physical Exam  Constitutional: She is oriented to person, place, and time.  HENT:  Nose: No mucosal edema.  Mouth/Throat: No oropharyngeal exudate or posterior oropharyngeal edema.  Eyes: Conjunctivae, EOM and lids are normal. Pupils are equal, round, and reactive to light.  Neck: No JVD present. Carotid bruit is not present. No edema present. No thyroid mass and no thyromegaly present.  Cardiovascular: S1 normal and S2 normal. Exam reveals no gallop.  No murmur heard. Pulses:      Dorsalis pedis pulses are 2+ on the right side, and 2+ on the left side.  Respiratory: No respiratory distress. She has decreased breath sounds in the right lower field and the left lower field. She has wheezes in the right middle field and the left middle field. She has no rhonchi. She has no rales.  GI: Soft. Bowel sounds are normal. There is no  tenderness.  Musculoskeletal:       Right ankle: She exhibits swelling.       Left ankle: She exhibits swelling.  Lymphadenopathy:    She has no cervical adenopathy.  Neurological: She is alert and oriented to person, place, and time. No cranial nerve deficit.  Skin: Skin is warm and dry. No rash noted. Nails show no clubbing.  Psychiatric: She has a normal mood and affect.      Data Reviewed: Basic Metabolic Panel: Recent Labs  Lab 10/23/17 2256 10/24/17 0533 10/26/17 0645  NA 140 139  --   K 4.8 4.2  --   CL 97* 99*  --   CO2 32 30  --   GLUCOSE 180* 173*  --   BUN 20 18  --   CREATININE 0.67 0.62 0.52  CALCIUM 9.4 9.0  --    Liver Function Tests: Recent Labs  Lab 10/23/17 2256  AST 39  ALT 27  ALKPHOS 74  BILITOT 0.5  PROT 6.7  ALBUMIN 3.6   CBC: Recent Labs  Lab 10/23/17 2256 10/24/17 0533  WBC 9.4 8.6  NEUTROABS 6.6*  --   HGB 12.2 10.7*  HCT 36.9 32.1*  MCV 95.0 94.6  PLT 304 224   Cardiac Enzymes: Recent Labs  Lab 10/23/17 2256 10/24/17 0533 10/24/17 1105  TROPONINI <0.03 <0.03 <0.03   BNP (last 3 results) Recent Labs    06/15/17 1133 09/11/17 1433 10/23/17 2256  BNP 159.0* 113.0* 41.0  CBG: Recent Labs  Lab 10/25/17 1140 10/25/17 1632 10/26/17 0746 10/26/17 1139 10/26/17 1642  GLUCAP 106* 124* 107* 98 153*    Recent Results (from the past 240 hour(s))  Blood Culture (routine x 2)     Status: Abnormal   Collection Time: 10/23/17 10:56 PM  Result Value Ref Range Status   Specimen Description BLOOD RIGHT HAND  Final   Special Requests   Final    BOTTLES DRAWN AEROBIC AND ANAEROBIC Blood Culture adequate volume   Culture  Setup Time   Final    AEROBIC BOTTLE ONLY GRAM POSITIVE COCCI CRITICAL RESULT CALLED TO, READ BACK BY AND VERIFIED WITH: CHRISTINE KATSOUDAS AT 2951 ON 10/24/2017 JJB    Culture METHICILLIN RESISTANT STAPHYLOCOCCUS AUREUS (A)  Final   Report Status 10/26/2017 FINAL  Final   Organism ID, Bacteria  METHICILLIN RESISTANT STAPHYLOCOCCUS AUREUS  Final      Susceptibility   Methicillin resistant staphylococcus aureus - MIC*    CIPROFLOXACIN >=8 RESISTANT Resistant     ERYTHROMYCIN >=8 RESISTANT Resistant     GENTAMICIN <=0.5 SENSITIVE Sensitive     OXACILLIN RESISTANT Resistant     TETRACYCLINE <=1 SENSITIVE Sensitive     VANCOMYCIN 1 SENSITIVE Sensitive     TRIMETH/SULFA <=10 SENSITIVE Sensitive     CLINDAMYCIN <=0.25 SENSITIVE Sensitive     RIFAMPIN <=0.5 SENSITIVE Sensitive     Inducible Clindamycin NEGATIVE Sensitive     * METHICILLIN RESISTANT STAPHYLOCOCCUS AUREUS  Blood Culture ID Panel (Reflexed)     Status: Abnormal   Collection Time: 10/23/17 10:56 PM  Result Value Ref Range Status   Enterococcus species NOT DETECTED NOT DETECTED Final   Listeria monocytogenes NOT DETECTED NOT DETECTED Final   Staphylococcus species DETECTED (A) NOT DETECTED Final    Comment: CRITICAL RESULT CALLED TO, READ BACK BY AND VERIFIED WITH: CHRISTINE KATSOUDAS AT 8841 ON 10/24/2017 JJB    Staphylococcus aureus DETECTED (A) NOT DETECTED Final    Comment: Methicillin (oxacillin)-resistant Staphylococcus aureus (MRSA). MRSA is predictably resistant to beta-lactam antibiotics (except ceftaroline). Preferred therapy is vancomycin unless clinically contraindicated. Patient requires contact precautions if  hospitalized. CRITICAL RESULT CALLED TO, READ BACK BY AND VERIFIED WITH: CHRISTINE KATSOUDAS AT 6606 ON 10/24/2017 JJB    Methicillin resistance DETECTED (A) NOT DETECTED Final    Comment: CRITICAL RESULT CALLED TO, READ BACK BY AND VERIFIED WITH: CHRISTINE KATSOUDAS AT 3016 ON 10/24/2017 JJB    Streptococcus species NOT DETECTED NOT DETECTED Final   Streptococcus agalactiae NOT DETECTED NOT DETECTED Final   Streptococcus pneumoniae NOT DETECTED NOT DETECTED Final   Streptococcus pyogenes NOT DETECTED NOT DETECTED Final   Acinetobacter baumannii NOT DETECTED NOT DETECTED Final    Enterobacteriaceae species NOT DETECTED NOT DETECTED Final   Enterobacter cloacae complex NOT DETECTED NOT DETECTED Final   Escherichia coli NOT DETECTED NOT DETECTED Final   Klebsiella oxytoca NOT DETECTED NOT DETECTED Final   Klebsiella pneumoniae NOT DETECTED NOT DETECTED Final   Proteus species NOT DETECTED NOT DETECTED Final   Serratia marcescens NOT DETECTED NOT DETECTED Final   Haemophilus influenzae NOT DETECTED NOT DETECTED Final   Neisseria meningitidis NOT DETECTED NOT DETECTED Final   Pseudomonas aeruginosa NOT DETECTED NOT DETECTED Final   Candida albicans NOT DETECTED NOT DETECTED Final   Candida glabrata NOT DETECTED NOT DETECTED Final   Candida krusei NOT DETECTED NOT DETECTED Final   Candida parapsilosis NOT DETECTED NOT DETECTED Final   Candida tropicalis NOT DETECTED NOT DETECTED Final  Blood  Culture (routine x 2)     Status: None (Preliminary result)   Collection Time: 10/23/17 10:57 PM  Result Value Ref Range Status   Specimen Description BLOOD RIGHT WRIST  Final   Special Requests   Final    BOTTLES DRAWN AEROBIC AND ANAEROBIC Blood Culture adequate volume   Culture NO GROWTH 3 DAYS  Final   Report Status PENDING  Incomplete  MRSA PCR Screening     Status: Abnormal   Collection Time: 10/24/17  5:18 AM  Result Value Ref Range Status   MRSA by PCR POSITIVE (A) NEGATIVE Final    Comment:        The GeneXpert MRSA Assay (FDA approved for NASAL specimens only), is one component of a comprehensive MRSA colonization surveillance program. It is not intended to diagnose MRSA infection nor to guide or monitor treatment for MRSA infections. RESULT CALLED TO, READ BACK BY AND VERIFIED WITH: Dorna Bloom @ 5374 11/02/17 [facility]   CULTURE, BLOOD (ROUTINE X 2) w Reflex to ID Panel     Status: None (Preliminary result)   Collection Time: 10/25/17  1:56 PM  Result Value Ref Range Status   Specimen Description BLOOD RAC  Final   Special Requests   Final    BOTTLES DRAWN  AEROBIC AND ANAEROBIC Blood Culture adequate volume   Culture NO GROWTH < 24 HOURS  Final   Report Status PENDING  Incomplete  CULTURE, BLOOD (ROUTINE X 2) w Reflex to ID Panel     Status: None (Preliminary result)   Collection Time: 10/25/17  1:56 PM  Result Value Ref Range Status   Specimen Description BLOOD LEFT ARM  Final   Special Requests   Final    BOTTLES DRAWN AEROBIC AND ANAEROBIC Blood Culture adequate volume   Culture NO GROWTH < 24 HOURS  Final   Report Status PENDING  Incomplete     Studies: No results found.  Scheduled Meds: . benzonatate  100 mg Oral TID  . budesonide (PULMICORT) nebulizer solution  0.5 mg Nebulization BID  . Chlorhexidine Gluconate Cloth  6 each Topical Q0600  . cholecalciferol  1,000 Units Oral Daily  . enoxaparin (LOVENOX) injection  40 mg Subcutaneous Q12H  . feeding supplement (ENSURE ENLIVE)  237 mL Oral BID BM  . FLUoxetine  40 mg Oral Daily  . fluticasone  2 spray Each Nare Daily  . gabapentin  300 mg Oral QHS  . insulin aspart  0-15 Units Subcutaneous TID WC  . insulin aspart  0-5 Units Subcutaneous QHS  . ipratropium-albuterol  3 mL Nebulization Q6H  . magnesium oxide  400 mg Oral Daily  . methylPREDNISolone (SOLU-MEDROL) injection  40 mg Intravenous Daily  . montelukast  10 mg Oral QHS  . multivitamin with minerals  1 tablet Oral Daily  . mupirocin ointment  1 application Nasal BID  . primidone  50 mg Oral BID  . simvastatin  40 mg Oral QHS  . sodium chloride flush  3 mL Intravenous Q12H   Continuous Infusions: . sodium chloride      Assessment/Plan:  1. Bacteremia with staph aureus 1 out of 4 bottles.  On IV vancomycin.  Per Dr. Ola Spurr and likely will need 4 weeks of IV antibiotics.  Follow-up repeatrf blood cultures. Sedimentation rate: 39.  Echocardiogram is pending.  Because of her status, unlikely to get TEE.  Her recent elbow surgery could be a source. PICC line tomorrow if B/C is negative. 2. Acute on chronic  respiratory failure.  off BiPAP, on 5 L nasal cannula 3. COPD exacerbation.  Discontinue Solu-Medrol, change to prednisone taper.  On DuoNeb and budesonide nebulizers. 4. Patient has an unstable compression fracture of the thoracic spine.  Back brace to be sent in from [facility].  Physical therapy may be needed to put on the brace.  Patient states she needs a Hoyer lift in order to get in the brace to sit in a chair. 5. Sleep apnea on oxygen at night 6. Diabetic neuropathy on gabapentin 7. Type 2 diabetes on sliding scale 8. Hyperlipidemia unspecified on simvastatin 9. Depression on Wellbutrin and fluoxetine Case discussed with Dr. Ola Spurr.  Code Status:     Code Status Orders  (From admission, onward)        Start     Ordered   10/24/17 0523  Full code  Continuous     10/24/17 0523    Code Status History    Date Active Date Inactive Code Status Order ID Comments User Context   08/20/2017 01:34 08/21/2017 18:28 Full Code [id number]  Lance Coon, MD Inpatient   10/03/2016 05:40 10/04/2016 17:11 Full Code [id number]  Harrie Foreman, MD Inpatient   11/15/2015 22:23 11/19/2015 15:14 Full Code [id number]  Hower, Aaron Mose, MD ED    Advance Directive Documentation     Most Recent Value  Type of Advance Directive  Healthcare Power of Attorney  Pre-existing out of facility DNR order (yellow form or pink MOST form)  No data  "MOST" Form in Place?  No data     Family Communication: Husband at the bedside disposition Plan: [facility] will go back to [facility].  PICC line will needed to be placed if repeat blood cultures are negative for 48 hours.  Antibiotics:  Vancomycin  Time spent: 38 minutes  Demetrios Loll  [employer]

## 2017-10-26 NOTE — Plan of Care (Signed)
  Progressing Education: Knowledge of General Education information will improve 10/26/2017 1820 - Progressing by Rowe Robert, RN Note Vanc  not given this pm  d/t level 35  for repeat  vanc trough Health Behavior/Discharge Planning: Ability to manage health-related needs will improve 10/26/2017 1820 - Progressing by Rowe Robert, RN Clinical Measurements: Ability to maintain clinical measurements within normal limits will improve 10/26/2017 1820 - Progressing by Rowe Robert, RN Will remain free from infection 10/26/2017 1820 - Progressing by Rowe Robert, RN Diagnostic test results will improve 10/26/2017 1820 - Progressing by Rowe Robert, RN Respiratory complications will improve 10/26/2017 1820 - Progressing by Rowe Robert, RN Cardiovascular complication will be avoided 10/26/2017 1820 - Progressing by Rowe Robert, RN Activity: Risk for activity intolerance will decrease 10/26/2017 1820 - Progressing by Rowe Robert, RN Nutrition: Adequate nutrition will be maintained 10/26/2017 1820 - Progressing by Rowe Robert, RN Coping: Level of anxiety will decrease 10/26/2017 1820 - Progressing by Rowe Robert, RN Elimination: Will not experience complications related to bowel motility 10/26/2017 1820 - Progressing by Rowe Robert, RN Will not experience complications related to urinary retention 10/26/2017 1820 - Progressing by Rowe Robert, RN Pain Managment: General experience of comfort will improve 10/26/2017 1820 - Progressing by Rowe Robert, RN Safety: Ability to remain free from injury will improve 10/26/2017 1820 - Progressing by Rowe Robert, RN Skin Integrity: Risk for impaired skin integrity will decrease 10/26/2017 1820 - Progressing by Rowe Robert, RN

## 2017-10-26 NOTE — Progress Notes (Addendum)
Per chart and RN case manager patient's blood cultures are positive and she will need a PICC line and IV ABX. Clinical Education officer, museum (CSW) made [employer] at [facility] aware of above and that patient will now require a private room. Per Neoma Laming patient has a semi-private room now and she will get back to CSW. CSW also made Sharita with [facility] aware of above. CSW will continue to follow and assist as needed.   CSW received a call back from [facility] admissions coordinator at [facility] stating they would be able to accept patient back and will coordinate with [facility].   [employer], LCSW [phone]

## 2017-10-26 NOTE — Progress Notes (Signed)
Anticoagulation monitoring(Lovenox):  76yo  F ordered Lovenox 40 mg Q24h  Filed Weights   10/23/17 2247 10/24/17 0521 10/24/17 1534  Weight: 248 lb 3.8 oz (112.6 kg) 240 lb (108.9 kg) 241 lb 11.2 oz (109.6 kg)   BMI 40.22   Lab Results  Component Value Date   CREATININE 0.52 10/26/2017   CREATININE 0.62 10/24/2017   CREATININE 0.67 10/23/2017   Estimated Creatinine Clearance: 76 mL/min (by C-G formula based on SCr of 0.52 mg/dL). Hemoglobin & Hematocrit     Component Value Date/Time   HGB 10.7 (L) 10/24/2017 0533   HGB 12.2 01/14/2015 1645   HCT 32.1 (L) 10/24/2017 0533   HCT 36.0 01/14/2015 1645     Per Protocol for Patient with estCrcl > 30 ml/min and BMI > 40, will transition to Lovenox 40 mg Q12h.      Chinita Greenland PharmD Clinical Pharmacist 10/26/2017

## 2017-10-26 NOTE — Progress Notes (Signed)
Pharmacy Antibiotic Note  Sara Pierce is a 76 y.o. female admitted on 10/23/2017 with bacteremia.  Pharmacy has been consulted for vancomycin dosing.  Plan: Vancomycin will be held due to high Vancomycin trough of 35. Vancomycin random level will be drawn 1115 1500 to reassess vancomycin dosing. Pharmacy will continue to follow and adjust as needed.   Height: 5\' 5"  (165.1 cm) Weight: 241 lb 11.2 oz (109.6 kg)(bed scale) IBW/kg (Calculated) : 57  Temp (24hrs), Avg:97.9 F (36.6 C), Min:97.5 F (36.4 C), Max:98.1 F (36.7 C)  Recent Labs  Lab 10/23/17 2256 10/24/17 0533 10/24/17 0745 10/26/17 0645 10/26/17 1536  WBC 9.4 8.6  --   --   --   CREATININE 0.67 0.62  --  0.52  --   LATICACIDVEN 2.0* 1.9 0.6  --   --   VANCOTROUGH  --   --   --   --  35*    Estimated Creatinine Clearance: 76 mL/min (by C-G formula based on SCr of 0.52 mg/dL).    Allergies  Allergen Reactions  . Aspirin Swelling  . Codeine Itching  . Naprosyn [Naproxen] Other (See Comments)    Per MAR pt allergic  . Other     Elastic Bandages/ Supports Per MAR  . Tape Hives  . Valacyclovir     Antimicrobials this admission: Anti-infectives (From admission, onward)   Start     Dose/Rate Route Frequency Ordered Stop   10/25/17 0400  vancomycin (VANCOCIN) 1,500 mg in sodium chloride 0.9 % 500 mL IVPB  Status:  Discontinued     1,500 mg 250 mL/hr over 120 Minutes Intravenous Every 12 hours 10/24/17 1916 10/26/17 1618   10/24/17 1900  vancomycin (VANCOCIN) IVPB 1000 mg/200 mL premix     1,000 mg 200 mL/hr over 60 Minutes Intravenous  Once 10/24/17 1846 10/24/17 2104   10/24/17 1600  azithromycin (ZITHROMAX) tablet 250 mg  Status:  Discontinued     250 mg Oral Daily-1800 10/24/17 1443 10/24/17 1845   10/24/17 0430  cefTRIAXone (ROCEPHIN) 1 g in dextrose 5 % 50 mL IVPB - Premix  Status:  Discontinued     1 g 100 mL/hr over 30 Minutes Intravenous Every 24 hours 10/24/17 0413 10/24/17 1443   10/24/17 0430   azithromycin (ZITHROMAX) 500 mg in dextrose 5 % 250 mL IVPB  Status:  Discontinued     500 mg 250 mL/hr over 60 Minutes Intravenous Every 24 hours 10/24/17 0413 10/24/17 1443       Microbiology results: Recent Results (from the past 240 hour(s))  Blood Culture (routine x 2)     Status: Abnormal   Collection Time: 10/23/17 10:56 PM  Result Value Ref Range Status   Specimen Description BLOOD RIGHT HAND  Final   Special Requests   Final    BOTTLES DRAWN AEROBIC AND ANAEROBIC Blood Culture adequate volume   Culture  Setup Time   Final    AEROBIC BOTTLE ONLY GRAM POSITIVE COCCI CRITICAL RESULT CALLED TO, READ BACK BY AND VERIFIED WITH: CHRISTINE KATSOUDAS AT 0240 ON 10/24/2017 JJB    Culture METHICILLIN RESISTANT STAPHYLOCOCCUS AUREUS (A)  Final   Report Status 10/26/2017 FINAL  Final   Organism ID, Bacteria METHICILLIN RESISTANT STAPHYLOCOCCUS AUREUS  Final      Susceptibility   Methicillin resistant staphylococcus aureus - MIC*    CIPROFLOXACIN >=8 RESISTANT Resistant     ERYTHROMYCIN >=8 RESISTANT Resistant     GENTAMICIN <=0.5 SENSITIVE Sensitive     OXACILLIN RESISTANT Resistant  TETRACYCLINE <=1 SENSITIVE Sensitive     VANCOMYCIN 1 SENSITIVE Sensitive     TRIMETH/SULFA <=10 SENSITIVE Sensitive     CLINDAMYCIN <=0.25 SENSITIVE Sensitive     RIFAMPIN <=0.5 SENSITIVE Sensitive     Inducible Clindamycin NEGATIVE Sensitive     * METHICILLIN RESISTANT STAPHYLOCOCCUS AUREUS  Blood Culture ID Panel (Reflexed)     Status: Abnormal   Collection Time: 10/23/17 10:56 PM  Result Value Ref Range Status   Enterococcus species NOT DETECTED NOT DETECTED Final   Listeria monocytogenes NOT DETECTED NOT DETECTED Final   Staphylococcus species DETECTED (A) NOT DETECTED Final    Comment: CRITICAL RESULT CALLED TO, READ BACK BY AND VERIFIED WITH: CHRISTINE KATSOUDAS AT 7619 ON 10/24/2017 JJB    Staphylococcus aureus DETECTED (A) NOT DETECTED Final    Comment: Methicillin  (oxacillin)-resistant Staphylococcus aureus (MRSA). MRSA is predictably resistant to beta-lactam antibiotics (except ceftaroline). Preferred therapy is vancomycin unless clinically contraindicated. Patient requires contact precautions if  hospitalized. CRITICAL RESULT CALLED TO, READ BACK BY AND VERIFIED WITH: CHRISTINE KATSOUDAS AT 5093 ON 10/24/2017 JJB    Methicillin resistance DETECTED (A) NOT DETECTED Final    Comment: CRITICAL RESULT CALLED TO, READ BACK BY AND VERIFIED WITH: CHRISTINE KATSOUDAS AT 2671 ON 10/24/2017 JJB    Streptococcus species NOT DETECTED NOT DETECTED Final   Streptococcus agalactiae NOT DETECTED NOT DETECTED Final   Streptococcus pneumoniae NOT DETECTED NOT DETECTED Final   Streptococcus pyogenes NOT DETECTED NOT DETECTED Final   Acinetobacter baumannii NOT DETECTED NOT DETECTED Final   Enterobacteriaceae species NOT DETECTED NOT DETECTED Final   Enterobacter cloacae complex NOT DETECTED NOT DETECTED Final   Escherichia coli NOT DETECTED NOT DETECTED Final   Klebsiella oxytoca NOT DETECTED NOT DETECTED Final   Klebsiella pneumoniae NOT DETECTED NOT DETECTED Final   Proteus species NOT DETECTED NOT DETECTED Final   Serratia marcescens NOT DETECTED NOT DETECTED Final   Haemophilus influenzae NOT DETECTED NOT DETECTED Final   Neisseria meningitidis NOT DETECTED NOT DETECTED Final   Pseudomonas aeruginosa NOT DETECTED NOT DETECTED Final   Candida albicans NOT DETECTED NOT DETECTED Final   Candida glabrata NOT DETECTED NOT DETECTED Final   Candida krusei NOT DETECTED NOT DETECTED Final   Candida parapsilosis NOT DETECTED NOT DETECTED Final   Candida tropicalis NOT DETECTED NOT DETECTED Final  Blood Culture (routine x 2)     Status: None (Preliminary result)   Collection Time: 10/23/17 10:57 PM  Result Value Ref Range Status   Specimen Description BLOOD RIGHT WRIST  Final   Special Requests   Final    BOTTLES DRAWN AEROBIC AND ANAEROBIC Blood Culture adequate  volume   Culture NO GROWTH 3 DAYS  Final   Report Status PENDING  Incomplete  MRSA PCR Screening     Status: Abnormal   Collection Time: 10/24/17  5:18 AM  Result Value Ref Range Status   MRSA by PCR POSITIVE (A) NEGATIVE Final    Comment:        The GeneXpert MRSA Assay (FDA approved for NASAL specimens only), is one component of a comprehensive MRSA colonization surveillance program. It is not intended to diagnose MRSA infection nor to guide or monitor treatment for MRSA infections. RESULT CALLED TO, READ BACK BY AND VERIFIED WITH: Dorna Bloom @ 2458 11/02/17 [place]   CULTURE, BLOOD (ROUTINE X 2) w Reflex to ID Panel     Status: None (Preliminary result)   Collection Time: 10/25/17  1:56 PM  Result Value Ref Range  Status   Specimen Description BLOOD RAC  Final   Special Requests   Final    BOTTLES DRAWN AEROBIC AND ANAEROBIC Blood Culture adequate volume   Culture NO GROWTH < 24 HOURS  Final   Report Status PENDING  Incomplete  CULTURE, BLOOD (ROUTINE X 2) w Reflex to ID Panel     Status: None (Preliminary result)   Collection Time: 10/25/17  1:56 PM  Result Value Ref Range Status   Specimen Description BLOOD LEFT ARM  Final   Special Requests   Final    BOTTLES DRAWN AEROBIC AND ANAEROBIC Blood Culture adequate volume   Culture NO GROWTH < 24 HOURS  Final   Report Status PENDING  Incomplete     Thank you for allowing pharmacy to be a part of this patient's care.  Lendon Ka, PharmD Pharmacy Resident 10/26/2017 4:25 PM

## 2017-10-27 ENCOUNTER — Inpatient Hospital Stay: Payer: Medicare (Managed Care)

## 2017-10-27 LAB — GLUCOSE, CAPILLARY
GLUCOSE-CAPILLARY: 101 mg/dL — AB (ref 65–99)
GLUCOSE-CAPILLARY: 136 mg/dL — AB (ref 65–99)
GLUCOSE-CAPILLARY: 334 mg/dL — AB (ref 65–99)
GLUCOSE-CAPILLARY: 67 mg/dL (ref 65–99)
Glucose-Capillary: 116 mg/dL — ABNORMAL HIGH (ref 65–99)

## 2017-10-27 LAB — VANCOMYCIN, RANDOM: VANCOMYCIN RM: 13

## 2017-10-27 LAB — CREATININE, SERUM
Creatinine, Ser: 0.68 mg/dL (ref 0.44–1.00)
GFR calc Af Amer: >60 mL/min (ref >60)
GFR calc non Af Amer: >60 mL/min (ref >60)

## 2017-10-27 MED ORDER — VANCOMYCIN HCL 10 G IV SOLR
1250.0000 mg | INTRAVENOUS | Status: DC
  Administered 2017-10-27: 1250 mg via INTRAVENOUS
  Filled 2017-10-27 (×2): qty 1250

## 2017-10-27 MED ORDER — PREDNISONE 20 MG PO TABS
40.0000 mg | ORAL_TABLET | Freq: Every day | ORAL | Status: DC
  Administered 2017-10-28: 10:00:00 40 mg via ORAL
  Filled 2017-10-27: qty 2

## 2017-10-27 MED ORDER — FERROUS SULFATE 325 (65 FE) MG PO TABS
325.0000 mg | ORAL_TABLET | Freq: Two times a day (BID) | ORAL | Status: DC
  Administered 2017-10-27 – 2017-10-28 (×3): 325 mg via ORAL
  Filled 2017-10-27 (×4): qty 1

## 2017-10-27 MED ORDER — ROPINIROLE HCL 0.25 MG PO TABS
0.2500 mg | ORAL_TABLET | Freq: Three times a day (TID) | ORAL | Status: DC
  Administered 2017-10-27 – 2017-10-28 (×4): 0.25 mg via ORAL
  Filled 2017-10-27 (×7): qty 1

## 2017-10-27 NOTE — Progress Notes (Signed)
Spoke with bedside nurse Joelene Millin and let her know that IV team will place patient's PICC in morning.  Carolee Rota, RN [facility]

## 2017-10-27 NOTE — Progress Notes (Signed)
Pharmacy Antibiotic Note  Sara Pierce is a 76 y.o. female admitted on 10/23/2017 withbacteremia.  Pharmacy has been consulted for vancomycin dosing.  Plan: Ke= 0.0413 h-1, T1/2= 17 hours, ABW= 78 kg, Vd= 55 L Will resume vancomycin 1250 mg iv q 24 hours. Will check further levels as clinically indicated to achieve a vancomycin trough of 15-20 mcg/ml.    Height: 5\' 5"  (165.1 cm) Weight: 241 lb 11.2 oz (109.6 kg)(bed scale) IBW/kg (Calculated) : 57  Temp (24hrs), Avg:97.9 F (36.6 C), Min:97.4 F (36.3 C), Max:98.6 F (37 C)  Recent Labs  Lab 10/23/17 2256 10/24/17 0533 10/24/17 0745 10/26/17 0645 10/26/17 1536 10/27/17 0433 10/27/17 1506  WBC 9.4 8.6  --   --   --   --   --   CREATININE 0.67 0.62  --  0.52  --  0.68  --   LATICACIDVEN 2.0* 1.9 0.6  --   --   --   --   VANCOTROUGH  --   --   --   --  35*  --   --   VANCORANDOM  --   --   --   --   --   --  13    Estimated Creatinine Clearance: 76 mL/min (by C-G formula based on SCr of 0.68 mg/dL).    Allergies  Allergen Reactions  . Aspirin Swelling  . Codeine Itching  . Naprosyn [Naproxen] Other (See Comments)    Per MAR pt allergic  . Other     Elastic Bandages/ Supports Per MAR  . Tape Hives  . Valacyclovir     Antimicrobials this admission: Anti-infectives (From admission, onward)   Start     Dose/Rate Route Frequency Ordered Stop   10/25/17 0400  vancomycin (VANCOCIN) 1,500 mg in sodium chloride 0.9 % 500 mL IVPB  Status:  Discontinued     1,500 mg 250 mL/hr over 120 Minutes Intravenous Every 12 hours 10/24/17 1916 10/26/17 1618   10/24/17 1900  vancomycin (VANCOCIN) IVPB 1000 mg/200 mL premix     1,000 mg 200 mL/hr over 60 Minutes Intravenous  Once 10/24/17 1846 10/24/17 2104   10/24/17 1600  azithromycin (ZITHROMAX) tablet 250 mg  Status:  Discontinued     250 mg Oral Daily-1800 10/24/17 1443 10/24/17 1845   10/24/17 0430  cefTRIAXone (ROCEPHIN) 1 g in dextrose 5 % 50 mL IVPB - Premix  Status:   Discontinued     1 g 100 mL/hr over 30 Minutes Intravenous Every 24 hours 10/24/17 0413 10/24/17 1443   10/24/17 0430  azithromycin (ZITHROMAX) 500 mg in dextrose 5 % 250 mL IVPB  Status:  Discontinued     500 mg 250 mL/hr over 60 Minutes Intravenous Every 24 hours 10/24/17 0413 10/24/17 1443       Microbiology results: Recent Results (from the past 240 hour(s))  Blood Culture (routine x 2)     Status: Abnormal   Collection Time: 10/23/17 10:56 PM  Result Value Ref Range Status   Specimen Description BLOOD RIGHT HAND  Final   Special Requests   Final    BOTTLES DRAWN AEROBIC AND ANAEROBIC Blood Culture adequate volume   Culture  Setup Time   Final    AEROBIC BOTTLE ONLY GRAM POSITIVE COCCI CRITICAL RESULT CALLED TO, READ BACK BY AND VERIFIED WITH: CHRISTINE KATSOUDAS AT 4315 ON 10/24/2017 JJB    Culture METHICILLIN RESISTANT STAPHYLOCOCCUS AUREUS (A)  Final   Report Status 10/26/2017 FINAL  Final   Organism ID,  Bacteria METHICILLIN RESISTANT STAPHYLOCOCCUS AUREUS  Final      Susceptibility   Methicillin resistant staphylococcus aureus - MIC*    CIPROFLOXACIN >=8 RESISTANT Resistant     ERYTHROMYCIN >=8 RESISTANT Resistant     GENTAMICIN <=0.5 SENSITIVE Sensitive     OXACILLIN RESISTANT Resistant     TETRACYCLINE <=1 SENSITIVE Sensitive     VANCOMYCIN 1 SENSITIVE Sensitive     TRIMETH/SULFA <=10 SENSITIVE Sensitive     CLINDAMYCIN <=0.25 SENSITIVE Sensitive     RIFAMPIN <=0.5 SENSITIVE Sensitive     Inducible Clindamycin NEGATIVE Sensitive     * METHICILLIN RESISTANT STAPHYLOCOCCUS AUREUS  Blood Culture ID Panel (Reflexed)     Status: Abnormal   Collection Time: 10/23/17 10:56 PM  Result Value Ref Range Status   Enterococcus species NOT DETECTED NOT DETECTED Final   Listeria monocytogenes NOT DETECTED NOT DETECTED Final   Staphylococcus species DETECTED (A) NOT DETECTED Final    Comment: CRITICAL RESULT CALLED TO, READ BACK BY AND VERIFIED WITH: CHRISTINE KATSOUDAS AT  1761 ON 10/24/2017 JJB    Staphylococcus aureus DETECTED (A) NOT DETECTED Final    Comment: Methicillin (oxacillin)-resistant Staphylococcus aureus (MRSA). MRSA is predictably resistant to beta-lactam antibiotics (except ceftaroline). Preferred therapy is vancomycin unless clinically contraindicated. Patient requires contact precautions if  hospitalized. CRITICAL RESULT CALLED TO, READ BACK BY AND VERIFIED WITH: CHRISTINE KATSOUDAS AT 6073 ON 10/24/2017 JJB    Methicillin resistance DETECTED (A) NOT DETECTED Final    Comment: CRITICAL RESULT CALLED TO, READ BACK BY AND VERIFIED WITH: CHRISTINE KATSOUDAS AT 7106 ON 10/24/2017 JJB    Streptococcus species NOT DETECTED NOT DETECTED Final   Streptococcus agalactiae NOT DETECTED NOT DETECTED Final   Streptococcus pneumoniae NOT DETECTED NOT DETECTED Final   Streptococcus pyogenes NOT DETECTED NOT DETECTED Final   Acinetobacter baumannii NOT DETECTED NOT DETECTED Final   Enterobacteriaceae species NOT DETECTED NOT DETECTED Final   Enterobacter cloacae complex NOT DETECTED NOT DETECTED Final   Escherichia coli NOT DETECTED NOT DETECTED Final   Klebsiella oxytoca NOT DETECTED NOT DETECTED Final   Klebsiella pneumoniae NOT DETECTED NOT DETECTED Final   Proteus species NOT DETECTED NOT DETECTED Final   Serratia marcescens NOT DETECTED NOT DETECTED Final   Haemophilus influenzae NOT DETECTED NOT DETECTED Final   Neisseria meningitidis NOT DETECTED NOT DETECTED Final   Pseudomonas aeruginosa NOT DETECTED NOT DETECTED Final   Candida albicans NOT DETECTED NOT DETECTED Final   Candida glabrata NOT DETECTED NOT DETECTED Final   Candida krusei NOT DETECTED NOT DETECTED Final   Candida parapsilosis NOT DETECTED NOT DETECTED Final   Candida tropicalis NOT DETECTED NOT DETECTED Final  Blood Culture (routine x 2)     Status: None (Preliminary result)   Collection Time: 10/23/17 10:57 PM  Result Value Ref Range Status   Specimen Description BLOOD RIGHT  WRIST  Final   Special Requests   Final    BOTTLES DRAWN AEROBIC AND ANAEROBIC Blood Culture adequate volume   Culture NO GROWTH 4 DAYS  Final   Report Status PENDING  Incomplete  MRSA PCR Screening     Status: Abnormal   Collection Time: 10/24/17  5:18 AM  Result Value Ref Range Status   MRSA by PCR POSITIVE (A) NEGATIVE Final    Comment:        The GeneXpert MRSA Assay (FDA approved for NASAL specimens only), is one component of a comprehensive MRSA colonization surveillance program. It is not intended to diagnose MRSA infection nor to  guide or monitor treatment for MRSA infections. RESULT CALLED TO, READ BACK BY AND VERIFIED WITH: Dorna Bloom @ 4259 11/02/17 [place]   CULTURE, BLOOD (ROUTINE X 2) w Reflex to ID Panel     Status: None (Preliminary result)   Collection Time: 10/25/17  1:56 PM  Result Value Ref Range Status   Specimen Description BLOOD RAC  Final   Special Requests   Final    BOTTLES DRAWN AEROBIC AND ANAEROBIC Blood Culture adequate volume   Culture NO GROWTH 2 DAYS  Final   Report Status PENDING  Incomplete  CULTURE, BLOOD (ROUTINE X 2) w Reflex to ID Panel     Status: None (Preliminary result)   Collection Time: 10/25/17  1:56 PM  Result Value Ref Range Status   Specimen Description BLOOD LEFT ARM  Final   Special Requests   Final    BOTTLES DRAWN AEROBIC AND ANAEROBIC Blood Culture adequate volume   Culture NO GROWTH 2 DAYS  Final   Report Status PENDING  Incomplete     Thank you for allowing pharmacy to be a part of this patient's care.  Ulice Dash, PharmD Clinical Pharmacist  10/27/2017 4:02 PM

## 2017-10-27 NOTE — Progress Notes (Signed)
Discharge canceled. Madlyn Frankel, RN

## 2017-10-27 NOTE — Progress Notes (Signed)
Plan is for patient to D/C back to [facility] tomorrow to private room 208 (B-wing) with a PICC line pending medical clearance. Patient is in agreement with plan. Deborah admissions coordinator at [facility] is aware of above. Sharita with [facility] is aware of above. [facility] requested EMS transport.   [employer], LCSW [phone]

## 2017-10-27 NOTE — Progress Notes (Signed)
Patient ID: Sara Pierce, female   DOB: 03/07/1941, 76 y.o.   MRN: [id number]   [facility] PROGRESS NOTE  Sara Pierce XIH:[id number] DOB: 1941-01-14 DOA: 10/23/2017 PCP: System, Provider Not In  HPI/Subjective: Better shortness of breath.  Some cough. On O2 [place] 4 L.  Left wrist and arm pain.  Objective: Vitals:   10/27/17 1356 10/27/17 1516  BP:  (!) 108/51  Pulse:  72  Resp:  (!) 22  Temp:  97.9 F (36.6 C)  SpO2: 95% 97%    Filed Weights   10/23/17 2247 10/24/17 0521 10/24/17 1534  Weight: 248 lb 3.8 oz (112.6 kg) 240 lb (108.9 kg) 241 lb 11.2 oz (109.6 kg)    ROS: Review of Systems  Constitutional: Negative for chills and fever.  Eyes: Negative for blurred vision.  Respiratory: Positive for cough and wheezing. Negative for shortness of breath.   Cardiovascular: Negative for chest pain.  Gastrointestinal: Negative for abdominal pain, constipation, diarrhea, nausea and vomiting.  Genitourinary: Negative for dysuria.  Musculoskeletal: Positive for joint pain. Negative for back pain.       Left wrist and arm pain.  Neurological: Positive for weakness. Negative for dizziness and headaches.   Exam: Physical Exam  Constitutional: She is oriented to person, place, and time.  HENT:  Nose: No mucosal edema.  Mouth/Throat: No oropharyngeal exudate or posterior oropharyngeal edema.  Eyes: Conjunctivae, EOM and lids are normal. Pupils are equal, round, and reactive to light.  Neck: No JVD present. Carotid bruit is not present. No edema present. No thyroid mass and no thyromegaly present.  Cardiovascular: S1 normal and S2 normal. Exam reveals no gallop.  No murmur heard. Pulses:      Dorsalis pedis pulses are 2+ on the right side, and 2+ on the left side.  Respiratory: No respiratory distress. She has decreased breath sounds in the right lower field and the left lower field. She has wheezes in the right middle field and the left middle field. She has no rhonchi. She has  no rales.  GI: Soft. Bowel sounds are normal. There is no tenderness.  Musculoskeletal:       Right ankle: She exhibits swelling.       Left ankle: She exhibits swelling.  Lymphadenopathy:    She has no cervical adenopathy.  Neurological: She is alert and oriented to person, place, and time. No cranial nerve deficit.  Skin: Skin is warm and dry. No rash noted. Nails show no clubbing.  Psychiatric: She has a normal mood and affect.      Data Reviewed: Basic Metabolic Panel: Recent Labs  Lab 10/23/17 2256 10/24/17 0533 10/26/17 0645 10/27/17 0433  NA 140 139  --   --   K 4.8 4.2  --   --   CL 97* 99*  --   --   CO2 32 30  --   --   GLUCOSE 180* 173*  --   --   BUN 20 18  --   --   CREATININE 0.67 0.62 0.52 0.68  CALCIUM 9.4 9.0  --   --    Liver Function Tests: Recent Labs  Lab 10/23/17 2256  AST 39  ALT 27  ALKPHOS 74  BILITOT 0.5  PROT 6.7  ALBUMIN 3.6   CBC: Recent Labs  Lab 10/23/17 2256 10/24/17 0533  WBC 9.4 8.6  NEUTROABS 6.6*  --   HGB 12.2 10.7*  HCT 36.9 32.1*  MCV 95.0 94.6  PLT 304 224  Cardiac Enzymes: Recent Labs  Lab 10/23/17 2256 10/24/17 0533 10/24/17 1105  TROPONINI <0.03 <0.03 <0.03   BNP (last 3 results) Recent Labs    06/15/17 1133 09/11/17 1433 10/23/17 2256  BNP 159.0* 113.0* 41.0     CBG: Recent Labs  Lab 10/26/17 1139 10/26/17 1642 10/26/17 2000 10/27/17 0804 10/27/17 1158  GLUCAP 98 153* 132* 101* 136*    Recent Results (from the past 240 hour(s))  Blood Culture (routine x 2)     Status: Abnormal   Collection Time: 10/23/17 10:56 PM  Result Value Ref Range Status   Specimen Description BLOOD RIGHT HAND  Final   Special Requests   Final    BOTTLES DRAWN AEROBIC AND ANAEROBIC Blood Culture adequate volume   Culture  Setup Time   Final    AEROBIC BOTTLE ONLY GRAM POSITIVE COCCI CRITICAL RESULT CALLED TO, READ BACK BY AND VERIFIED WITH: CHRISTINE KATSOUDAS AT 0865 ON 10/24/2017 JJB    Culture  METHICILLIN RESISTANT STAPHYLOCOCCUS AUREUS (A)  Final   Report Status 10/26/2017 FINAL  Final   Organism ID, Bacteria METHICILLIN RESISTANT STAPHYLOCOCCUS AUREUS  Final      Susceptibility   Methicillin resistant staphylococcus aureus - MIC*    CIPROFLOXACIN >=8 RESISTANT Resistant     ERYTHROMYCIN >=8 RESISTANT Resistant     GENTAMICIN <=0.5 SENSITIVE Sensitive     OXACILLIN RESISTANT Resistant     TETRACYCLINE <=1 SENSITIVE Sensitive     VANCOMYCIN 1 SENSITIVE Sensitive     TRIMETH/SULFA <=10 SENSITIVE Sensitive     CLINDAMYCIN <=0.25 SENSITIVE Sensitive     RIFAMPIN <=0.5 SENSITIVE Sensitive     Inducible Clindamycin NEGATIVE Sensitive     * METHICILLIN RESISTANT STAPHYLOCOCCUS AUREUS  Blood Culture ID Panel (Reflexed)     Status: Abnormal   Collection Time: 10/23/17 10:56 PM  Result Value Ref Range Status   Enterococcus species NOT DETECTED NOT DETECTED Final   Listeria monocytogenes NOT DETECTED NOT DETECTED Final   Staphylococcus species DETECTED (A) NOT DETECTED Final    Comment: CRITICAL RESULT CALLED TO, READ BACK BY AND VERIFIED WITH: CHRISTINE KATSOUDAS AT 7846 ON 10/24/2017 JJB    Staphylococcus aureus DETECTED (A) NOT DETECTED Final    Comment: Methicillin (oxacillin)-resistant Staphylococcus aureus (MRSA). MRSA is predictably resistant to beta-lactam antibiotics (except ceftaroline). Preferred therapy is vancomycin unless clinically contraindicated. Patient requires contact precautions if  hospitalized. CRITICAL RESULT CALLED TO, READ BACK BY AND VERIFIED WITH: CHRISTINE KATSOUDAS AT 9629 ON 10/24/2017 JJB    Methicillin resistance DETECTED (A) NOT DETECTED Final    Comment: CRITICAL RESULT CALLED TO, READ BACK BY AND VERIFIED WITH: CHRISTINE KATSOUDAS AT 5284 ON 10/24/2017 JJB    Streptococcus species NOT DETECTED NOT DETECTED Final   Streptococcus agalactiae NOT DETECTED NOT DETECTED Final   Streptococcus pneumoniae NOT DETECTED NOT DETECTED Final    Streptococcus pyogenes NOT DETECTED NOT DETECTED Final   Acinetobacter baumannii NOT DETECTED NOT DETECTED Final   Enterobacteriaceae species NOT DETECTED NOT DETECTED Final   Enterobacter cloacae complex NOT DETECTED NOT DETECTED Final   Escherichia coli NOT DETECTED NOT DETECTED Final   Klebsiella oxytoca NOT DETECTED NOT DETECTED Final   Klebsiella pneumoniae NOT DETECTED NOT DETECTED Final   Proteus species NOT DETECTED NOT DETECTED Final   Serratia marcescens NOT DETECTED NOT DETECTED Final   Haemophilus influenzae NOT DETECTED NOT DETECTED Final   Neisseria meningitidis NOT DETECTED NOT DETECTED Final   Pseudomonas aeruginosa NOT DETECTED NOT DETECTED Final   Candida albicans  NOT DETECTED NOT DETECTED Final   Candida glabrata NOT DETECTED NOT DETECTED Final   Candida krusei NOT DETECTED NOT DETECTED Final   Candida parapsilosis NOT DETECTED NOT DETECTED Final   Candida tropicalis NOT DETECTED NOT DETECTED Final  Blood Culture (routine x 2)     Status: None (Preliminary result)   Collection Time: 10/23/17 10:57 PM  Result Value Ref Range Status   Specimen Description BLOOD RIGHT WRIST  Final   Special Requests   Final    BOTTLES DRAWN AEROBIC AND ANAEROBIC Blood Culture adequate volume   Culture NO GROWTH 4 DAYS  Final   Report Status PENDING  Incomplete  MRSA PCR Screening     Status: Abnormal   Collection Time: 10/24/17  5:18 AM  Result Value Ref Range Status   MRSA by PCR POSITIVE (A) NEGATIVE Final    Comment:        The GeneXpert MRSA Assay (FDA approved for NASAL specimens only), is one component of a comprehensive MRSA colonization surveillance program. It is not intended to diagnose MRSA infection nor to guide or monitor treatment for MRSA infections. RESULT CALLED TO, READ BACK BY AND VERIFIED WITH: Dorna Bloom @ 3532 11/02/17 [facility]   CULTURE, BLOOD (ROUTINE X 2) w Reflex to ID Panel     Status: None (Preliminary result)   Collection Time: 10/25/17  1:56 PM   Result Value Ref Range Status   Specimen Description BLOOD RAC  Final   Special Requests   Final    BOTTLES DRAWN AEROBIC AND ANAEROBIC Blood Culture adequate volume   Culture NO GROWTH 2 DAYS  Final   Report Status PENDING  Incomplete  CULTURE, BLOOD (ROUTINE X 2) w Reflex to ID Panel     Status: None (Preliminary result)   Collection Time: 10/25/17  1:56 PM  Result Value Ref Range Status   Specimen Description BLOOD LEFT ARM  Final   Special Requests   Final    BOTTLES DRAWN AEROBIC AND ANAEROBIC Blood Culture adequate volume   Culture NO GROWTH 2 DAYS  Final   Report Status PENDING  Incomplete     Studies: Dg Elbow 2 Views Left  Result Date: 10/27/2017 CLINICAL DATA:  Left elbow pain after fall 2 weeks ago. EXAM: LEFT ELBOW - 2 VIEW COMPARISON:  Radiographs of August 19, 2017. FINDINGS: Status post surgical internal fixation of comminuted unhealed fracture of distal left humerus. Status post surgical internal fixation of proximal ulna is noted as well. No acute fracture or dislocation is noted. The cast has been removed. No soft tissue abnormalities noted. IMPRESSION: Postsurgical changes as described above. No acute abnormality seen in the left elbow. Electronically Signed   By: Marijo Conception, M.D.   On: 10/27/2017 13:58   Dg Wrist Complete Left  Result Date: 10/27/2017 CLINICAL DATA:  Left wrist pain after fall 2 weeks ago. EXAM: LEFT WRIST - COMPLETE 3+ VIEW COMPARISON:  Radiographs of August 12, 2017. FINDINGS: There is no evidence of fracture or dislocation. There is no evidence of arthropathy or other focal bone abnormality. Soft tissues are unremarkable. IMPRESSION: No significant abnormality seen in the left wrist. Electronically Signed   By: Marijo Conception, M.D.   On: 10/27/2017 13:56    Scheduled Meds: . benzonatate  100 mg Oral TID  . budesonide (PULMICORT) nebulizer solution  0.5 mg Nebulization BID  . Chlorhexidine Gluconate Cloth  6 each Topical Q0600  .  cholecalciferol  1,000 Units Oral Daily  .  enoxaparin (LOVENOX) injection  40 mg Subcutaneous Q12H  . feeding supplement (ENSURE ENLIVE)  237 mL Oral BID BM  . ferrous sulfate  325 mg Oral BID WC  . FLUoxetine  40 mg Oral Daily  . fluticasone  2 spray Each Nare Daily  . gabapentin  300 mg Oral QHS  . insulin aspart  0-15 Units Subcutaneous TID WC  . insulin aspart  0-5 Units Subcutaneous QHS  . ipratropium-albuterol  3 mL Nebulization Q6H  . magnesium oxide  400 mg Oral Daily  . montelukast  10 mg Oral QHS  . multivitamin with minerals  1 tablet Oral Daily  . mupirocin ointment  1 application Nasal BID  . predniSONE  50 mg Oral Q breakfast  . primidone  50 mg Oral BID  . rOPINIRole  0.25 mg Oral TID  . simvastatin  40 mg Oral QHS  . sodium chloride flush  3 mL Intravenous Q12H   Continuous Infusions: . sodium chloride    . vancomycin      Assessment/Plan:  1. Bacteremia with staph aureus 1 out of 4 bottles.  On IV vancomycin.  Per Dr. Ola Spurr and likely will need 4 weeks of IV antibiotics.  Follow-up repeatrf blood cultures. Sedimentation rate: 39.  Echocardiogram is pending.  Because of her status, unlikely to get TEE.  Her recent elbow surgery could be a source.  Repeat blood culture is negative for 48 hours.  The patient will get PICC line today.  Since the vancomycin trough is elevated, Dr. Ola Spurr suggest adjusting vancomycin dose today. 2. Acute on chronic respiratory failure.  off BiPAP, on 4 L nasal cannula 3. COPD exacerbation.  Discontinue Solu-Medrol, changed to prednisone taper.  On DuoNeb and budesonide nebulizers. 4. Patient has an unstable compression fracture of the thoracic spine.  Back brace to [facility] sent in from [facility].  Physical therapy may be needed to put on the brace.  Patient states she needs a Hoyer lift in order to get in the brace to sit in a chair. 5. Sleep apnea on oxygen at night 6. Diabetic neuropathy on gabapentin 7. Type 2 diabetes on  sliding scale 8. Hyperlipidemia unspecified on simvastatin 9. Depression on Wellbutrin and fluoxetine Case discussed with Dr. Ola Spurr.  Code Status:     Code Status Orders  (From admission, onward)        Start     Ordered   10/24/17 0523  Full code  Continuous     10/24/17 0523    Code Status History    Date Active Date Inactive Code Status Order ID Comments User Context   08/20/2017 01:34 08/21/2017 18:28 Full Code [id number]  Lance Coon, MD Inpatient   10/03/2016 05:40 10/04/2016 17:11 Full Code [id number]  Harrie Foreman, MD Inpatient   11/15/2015 22:23 11/19/2015 15:14 Full Code [id number]  Hower, Aaron Mose, MD ED    Advance Directive Documentation     Most Recent Value  Type of Advance Directive  Healthcare Power of Attorney  Pre-existing out of facility DNR order (yellow form or pink MOST form)  No data  "MOST" Form in Place?  No data     Family Communication: Husband at the bedside disposition Plan: Back to [facility] tomorrow.  Antibiotics:  Vancomycin  Time spent: 32 minutes  [place]

## 2017-10-27 NOTE — Plan of Care (Signed)
Free of falls during shift.  Reported BLE pain/spasms, back pain 10/10, received PRN PO Flexeril 5mg , PRN PO Vicodin 10-650mg , PRN PO Valium 5mg  to relax.  Reported pain improved to 9/10, Dr. Marcille Blanco paged, will receive ordered PO Requip 0.25mg .  No other complaints.  Bed in low position, call bell within reach.  WCTM.

## 2017-10-27 NOTE — Care Management Important Message (Signed)
Important Message  Patient Details  Name: LOREAN EKSTRAND MRN: [id number] Date of Birth: [birth year]   Medicare Important Message Given:  Yes    Shelbie Ammons, RN 10/27/2017, 10:41 AM

## 2017-10-27 NOTE — Progress Notes (Signed)
Vanc trough to be drawn at 1500 to determine dosing for discharge. [facility] made aware. Awaiting PICC line placement. Above discussed with MD, CM, and pharmacist. Patient updated. Madlyn Frankel, RN

## 2017-10-27 NOTE — Progress Notes (Signed)
[name] PROGRESS NOTE Date of Admission:  10/23/2017     ID: Sara Pierce is a 76 y.o. female with MRSA bacteremia Active Problems:   COPD with exacerbation ([place])   Pressure injury of skin   Subjective: No fevers, breathing a little better. Still with elbow and back pain  ROS  Eleven systems are reviewed and negative except per hpi  Medications:  Antibiotics Given (last 72 hours)    Date/Time Action Medication Dose Rate   10/24/17 1713 Given   azithromycin (ZITHROMAX) tablet 250 mg 250 mg    10/24/17 2004 New Bag/Given   vancomycin (VANCOCIN) IVPB 1000 mg/200 mL premix 1,000 mg 200 mL/hr   10/25/17 0502 New Bag/Given   vancomycin (VANCOCIN) 1,500 mg in sodium chloride 0.9 % 500 mL IVPB 1,500 mg 250 mL/hr   10/25/17 1615 New Bag/Given   vancomycin (VANCOCIN) 1,500 mg in sodium chloride 0.9 % 500 mL IVPB 1,500 mg 250 mL/hr   10/26/17 0458 New Bag/Given   vancomycin (VANCOCIN) 1,500 mg in sodium chloride 0.9 % 500 mL IVPB 1,500 mg 250 mL/hr     . benzonatate  100 mg Oral TID  . budesonide (PULMICORT) nebulizer solution  0.5 mg Nebulization BID  . Chlorhexidine Gluconate Cloth  6 each Topical Q0600  . cholecalciferol  1,000 Units Oral Daily  . enoxaparin (LOVENOX) injection  40 mg Subcutaneous Q12H  . feeding supplement (ENSURE ENLIVE)  237 mL Oral BID BM  . ferrous sulfate  325 mg Oral BID WC  . FLUoxetine  40 mg Oral Daily  . fluticasone  2 spray Each Nare Daily  . gabapentin  300 mg Oral QHS  . insulin aspart  0-15 Units Subcutaneous TID WC  . insulin aspart  0-5 Units Subcutaneous QHS  . ipratropium-albuterol  3 mL Nebulization Q6H  . magnesium oxide  400 mg Oral Daily  . montelukast  10 mg Oral QHS  . multivitamin with minerals  1 tablet Oral Daily  . mupirocin ointment  1 application Nasal BID  . predniSONE  50 mg Oral Q breakfast  . primidone  50 mg Oral BID  . rOPINIRole  0.25 mg Oral TID  . simvastatin  40 mg Oral QHS  . sodium  chloride flush  3 mL Intravenous Q12H    Objective: Vital signs in last 24 hours: Temp:  [97.4 F (36.3 C)-98.6 F (37 C)] 97.4 F (36.3 C) (11/15 0755) Pulse Rate:  [63-68] 68 (11/15 0755) Resp:  [2-20] 20 (11/15 0755) BP: (93-142)/(42-77) 93/42 (11/15 0755) SpO2:  [96 %-100 %] 100 % (11/15 0755) Constitutional:  oriented to person, place, and time. Obese. Chronically ill appeargin. HENT: [place]/AT, PERRLA, no scleral icterus Mouth/Throat: Oropharynx is clear and moist. No oropharyngeal exudate.  Cardiovascular: Normal rate, regular rhythm and normal heart sounds.  Pulmonary/Chest: poor air movement  Neck = supple, no nuchal rigidity Abdominal: Soft. Bowel sounds are normal.  exhibits no distension.  Lymphadenopathy: no cervical adenopathy. No axillary adenopathy Neurological: alert and oriented to person, place, and time.  Skin: L elbow has some swelling and incision with some scabs. Near wrist she has a small abrasion as well.  Psychiatric: a normal mood and affect.  behavior is normal.    Lab Results Recent Labs    10/26/17 0645 10/27/17 0433  CREATININE 0.52 0.68   Lab Results  Component Value Date   ESRSEDRATE 39 (H) 10/26/2017   Lab Results  Component Value Date   CRP <0.8 10/26/2017  Microbiology: Results for orders placed or performed during the hospital encounter of 10/23/17  Blood Culture (routine x 2)     Status: Abnormal   Collection Time: 10/23/17 10:56 PM  Result Value Ref Range Status   Specimen Description BLOOD RIGHT HAND  Final   Special Requests   Final    BOTTLES DRAWN AEROBIC AND ANAEROBIC Blood Culture adequate volume   Culture  Setup Time   Final    AEROBIC BOTTLE ONLY GRAM POSITIVE COCCI CRITICAL RESULT CALLED TO, READ BACK BY AND VERIFIED WITH: CHRISTINE KATSOUDAS AT 1660 ON 10/24/2017 JJB    Culture METHICILLIN RESISTANT STAPHYLOCOCCUS AUREUS (A)  Final   Report Status 10/26/2017 FINAL  Final   Organism ID, Bacteria METHICILLIN  RESISTANT STAPHYLOCOCCUS AUREUS  Final      Susceptibility   Methicillin resistant staphylococcus aureus - MIC*    CIPROFLOXACIN >=8 RESISTANT Resistant     ERYTHROMYCIN >=8 RESISTANT Resistant     GENTAMICIN <=0.5 SENSITIVE Sensitive     OXACILLIN RESISTANT Resistant     TETRACYCLINE <=1 SENSITIVE Sensitive     VANCOMYCIN 1 SENSITIVE Sensitive     TRIMETH/SULFA <=10 SENSITIVE Sensitive     CLINDAMYCIN <=0.25 SENSITIVE Sensitive     RIFAMPIN <=0.5 SENSITIVE Sensitive     Inducible Clindamycin NEGATIVE Sensitive     * METHICILLIN RESISTANT STAPHYLOCOCCUS AUREUS  Blood Culture ID Panel (Reflexed)     Status: Abnormal   Collection Time: 10/23/17 10:56 PM  Result Value Ref Range Status   Enterococcus species NOT DETECTED NOT DETECTED Final   Listeria monocytogenes NOT DETECTED NOT DETECTED Final   Staphylococcus species DETECTED (A) NOT DETECTED Final    Comment: CRITICAL RESULT CALLED TO, READ BACK BY AND VERIFIED WITH: CHRISTINE KATSOUDAS AT 6301 ON 10/24/2017 JJB    Staphylococcus aureus DETECTED (A) NOT DETECTED Final    Comment: Methicillin (oxacillin)-resistant Staphylococcus aureus (MRSA). MRSA is predictably resistant to beta-lactam antibiotics (except ceftaroline). Preferred therapy is vancomycin unless clinically contraindicated. Patient requires contact precautions if  hospitalized. CRITICAL RESULT CALLED TO, READ BACK BY AND VERIFIED WITH: CHRISTINE KATSOUDAS AT 6010 ON 10/24/2017 JJB    Methicillin resistance DETECTED (A) NOT DETECTED Final    Comment: CRITICAL RESULT CALLED TO, READ BACK BY AND VERIFIED WITH: CHRISTINE KATSOUDAS AT 9323 ON 10/24/2017 JJB    Streptococcus species NOT DETECTED NOT DETECTED Final   Streptococcus agalactiae NOT DETECTED NOT DETECTED Final   Streptococcus pneumoniae NOT DETECTED NOT DETECTED Final   Streptococcus pyogenes NOT DETECTED NOT DETECTED Final   Acinetobacter baumannii NOT DETECTED NOT DETECTED Final   Enterobacteriaceae species  NOT DETECTED NOT DETECTED Final   Enterobacter cloacae complex NOT DETECTED NOT DETECTED Final   Escherichia coli NOT DETECTED NOT DETECTED Final   Klebsiella oxytoca NOT DETECTED NOT DETECTED Final   Klebsiella pneumoniae NOT DETECTED NOT DETECTED Final   Proteus species NOT DETECTED NOT DETECTED Final   Serratia marcescens NOT DETECTED NOT DETECTED Final   Haemophilus influenzae NOT DETECTED NOT DETECTED Final   Neisseria meningitidis NOT DETECTED NOT DETECTED Final   Pseudomonas aeruginosa NOT DETECTED NOT DETECTED Final   Candida albicans NOT DETECTED NOT DETECTED Final   Candida glabrata NOT DETECTED NOT DETECTED Final   Candida krusei NOT DETECTED NOT DETECTED Final   Candida parapsilosis NOT DETECTED NOT DETECTED Final   Candida tropicalis NOT DETECTED NOT DETECTED Final  Blood Culture (routine x 2)     Status: None (Preliminary result)   Collection Time: 10/23/17 10:57 PM  Result Value Ref Range Status   Specimen Description BLOOD RIGHT WRIST  Final   Special Requests   Final    BOTTLES DRAWN AEROBIC AND ANAEROBIC Blood Culture adequate volume   Culture NO GROWTH 4 DAYS  Final   Report Status PENDING  Incomplete  MRSA PCR Screening     Status: Abnormal   Collection Time: 10/24/17  5:18 AM  Result Value Ref Range Status   MRSA by PCR POSITIVE (A) NEGATIVE Final    Comment:        The GeneXpert MRSA Assay (FDA approved for NASAL specimens only), is one component of a comprehensive MRSA colonization surveillance program. It is not intended to diagnose MRSA infection nor to guide or monitor treatment for MRSA infections. RESULT CALLED TO, READ BACK BY AND VERIFIED WITH: Dorna Bloom @ 9983 11/02/17 [facility]   CULTURE, BLOOD (ROUTINE X 2) w Reflex to ID Panel     Status: None (Preliminary result)   Collection Time: 10/25/17  1:56 PM  Result Value Ref Range Status   Specimen Description BLOOD RAC  Final   Special Requests   Final    BOTTLES DRAWN AEROBIC AND ANAEROBIC Blood  Culture adequate volume   Culture NO GROWTH 2 DAYS  Final   Report Status PENDING  Incomplete  CULTURE, BLOOD (ROUTINE X 2) w Reflex to ID Panel     Status: None (Preliminary result)   Collection Time: 10/25/17  1:56 PM  Result Value Ref Range Status   Specimen Description BLOOD LEFT ARM  Final   Special Requests   Final    BOTTLES DRAWN AEROBIC AND ANAEROBIC Blood Culture adequate volume   Culture NO GROWTH 2 DAYS  Final   Report Status PENDING  Incomplete    Studies/Results: TTE Study Conclusions  - Procedure narrative: Transthoracic echocardiography. The study   was technically difficult. - Left ventricle: Systolic function was normal. The estimated   ejection fraction was in the range of 60% to 65%.  Assessment/Plan: Sara Pierce is a 76 y.o. female with MRSA bacteremia, possibly from L elbow wound site following fracture. She has multiple T spine compression fractures (unstabe T5-6) as well and is at a SNF and getting rehab.  TTE negative for endocarditis but was a poor study.   Recommendations Vanco level too high - will need to hold and repeat   I do no think a TEE is feasible for multiple reasons including COPD exac, she is on O2 and has unstable T spine fractures and per NS note at [facility] from 10/18/17  (Ms. Lintner to remain in TLSO brace at all times she is upright. Recommend [employer])h fitting for new brace. Physical limitations include no bending, twisting, lifting greater than 10 pounds. This fracture is considered unstable and further neurological deficits could present with spinal movements)  At this point will plan on 4 week course of IV abx from time of first negative culture - 11/13  Will place IV abx order sheet tomorrow.  Discussed with patient and her husband  Thank you very much for the consult. Will follow with you.  Leonel Ramsay   10/27/2017, 11:48 AM

## 2017-10-27 NOTE — Progress Notes (Signed)
Medications administered by student RN [id number] with supervision of Clinical Instructor Elihue Ebert MSN, RN-BC or patient's assigned RN.   

## 2017-10-28 ENCOUNTER — Inpatient Hospital Stay: Payer: Medicare (Managed Care)

## 2017-10-28 LAB — GLUCOSE, CAPILLARY
GLUCOSE-CAPILLARY: 83 mg/dL (ref 65–99)
GLUCOSE-CAPILLARY: 99 mg/dL (ref 65–99)

## 2017-10-28 LAB — CULTURE, BLOOD (ROUTINE X 2)
Culture: NO GROWTH
Special Requests: ADEQUATE

## 2017-10-28 MED ORDER — BUDESONIDE 0.5 MG/2ML IN SUSP: 0.5000 mg | Freq: Two times a day (BID) | RESPIRATORY_TRACT | 12 refills | Status: DC

## 2017-10-28 MED ORDER — IPRATROPIUM-ALBUTEROL 0.5-2.5 (3) MG/3ML IN SOLN
3.0000 mL | Freq: Three times a day (TID) | RESPIRATORY_TRACT | Status: DC
  Administered 2017-10-28: 3 mL via RESPIRATORY_TRACT
  Filled 2017-10-28: qty 3

## 2017-10-28 MED ORDER — IPRATROPIUM-ALBUTEROL 0.5-2.5 (3) MG/3ML IN SOLN: 3.0000 mL | Freq: Four times a day (QID) | RESPIRATORY_TRACT | Status: DC | PRN

## 2017-10-28 MED ORDER — SODIUM CHLORIDE 0.9% FLUSH: 10.0000 mL | Freq: Two times a day (BID) | INTRAVENOUS | Status: DC

## 2017-10-28 MED ORDER — SODIUM CHLORIDE 0.9 % IV SOLN: 1250.0000 mg | INTRAVENOUS | Status: DC

## 2017-10-28 MED ORDER — FERROUS SULFATE 325 (65 FE) MG PO TABS: 325.0000 mg | ORAL_TABLET | Freq: Two times a day (BID) | ORAL | 3 refills | Status: DC

## 2017-10-28 MED ORDER — SODIUM CHLORIDE 0.9% FLUSH: 10.0000 mL | INTRAVENOUS | Status: DC | PRN

## 2017-10-28 MED ORDER — PREDNISONE 10 MG PO TABS: ORAL_TABLET | ORAL | Status: DC

## 2017-10-28 NOTE — Progress Notes (Signed)
Patient discharged to [facility] per MD order. Report called to caroline at facility. EMS will transport.

## 2017-10-28 NOTE — Progress Notes (Signed)
Removed right antecubital PIV, site with dried blood noted and skin pink. No drainage noted. Dressing applied and pressure held. Removed right posterior wrist PIV, site WNL. No drainage noted. Dressing applied and pressure held.

## 2017-10-28 NOTE — Discharge Instructions (Signed)
Heart healthy and ADA diet. Home O2 [place] 3-4 L.

## 2017-10-28 NOTE — Progress Notes (Addendum)
Clinical Education officer, museum (CSW) met with patient and her husband Mr. Alkins was at bedside along with [place] and [place] from [place]. Patient and husband have agreed for patient to return to [facility] today if [facility] will move patient to [facility] when a bed is available. RN aware of above and will call report and arrange EMS for transport. CSW sent D/C orders to [facility] via [facility]. APS worker Kennyth Arnold is aware of above. Please reconsult if future social work needs arise. CSW signing off.   [employer], LCSW [phone]

## 2017-10-28 NOTE — Progress Notes (Signed)
Infectious Disease Long Term IV Antibiotic Orders YETZALI WELD [birth year]  Diagnosis:  MRSA bacteremia  Culture results Blood Culture (routine x 2) [[id number]] (Abnormal)  Collected: 10/23/17 2256  Order Status: Completed Specimen: Blood Updated: 10/26/17 0831   Specimen Description BLOOD RIGHT HAND   Special Requests BOTTLES DRAWN AEROBIC AND ANAEROBIC Blood Culture adequate volume   Culture Setup Time --   AEROBIC BOTTLE ONLY  GRAM POSITIVE COCCI  CRITICAL RESULT CALLED TO, READ BACK BY AND VERIFIED WITH:  CHRISTINE KATSOUDAS AT 2353 ON 10/24/2017 JJB    Culture METHICILLIN RESISTANT STAPHYLOCOCCUS AUREUS Abnormal    Report Status 10/26/2017 FINAL   Organism ID, Bacteria METHICILLIN RESISTANT STAPHYLOCOCCUS AUREUS  Susceptibility   Methicillin resistant staphylococcus aureus (ZZ00)   Antibiotic Interpretation Method Status   CIPROFLOXACIN Resistant MIC Final   ERYTHROMYCIN Resistant MIC Final   GENTAMICIN Sensitive MIC Final   OXACILLIN Resistant MIC Final   TETRACYCLINE Sensitive MIC Final   VANCOMYCIN Sensitive MIC Final   TRIMETH/SULFA Sensitive MIC Final   CLINDAMYCIN Sensitive MIC Final   RIFAMPIN Sensitive MIC Final   Inducible Clindamycin Sensitive MIC Final          LABS Lab Results  Component Value Date   CREATININE 0.68 10/27/2017   Lab Results  Component Value Date   WBC 8.6 10/24/2017   HGB 10.7 (L) 10/24/2017   HCT 32.1 (L) 10/24/2017   MCV 94.6 10/24/2017   PLT 224 10/24/2017   Lab Results  Component Value Date   ESRSEDRATE 39 (H) 10/26/2017   Lab Results  Component Value Date   CRP <0.8 10/26/2017    Allergies:  Allergies  Allergen Reactions  . Aspirin Swelling  . Codeine Itching  . Naprosyn [Naproxen] Other (See Comments)    Per MAR pt allergic  . Other     Elastic Bandages/ Supports Per MAR  . Tape Hives  . Valacyclovir     Discharge antibiotics Vancomycin              1250    mg  every       24        hours .     Goal  vancomycin trough 15-20.    Pharmacy to adjust dosing based on levels PICC Care per protocol Labs weekly while on IV antibiotics -FAX weekly labs to [id number] CBC w diff   Comprehensive met panel Vancomycin Trough    Planned duration of antibiotics 4 weeks   Stop date Dec 11  Follow up clinic date  2-3 week    Leonel Ramsay, MD

## 2017-10-28 NOTE — Care Management (Signed)
Received telephone call from Sara Pierce, Sara Pierce. States that Sara Pierce has appealed her discharge, Detailed Notice of Discharge and IM signed.  Sara Pierce will fax requested information to [facility]. Sara Ammons RN MSN Care Management [phone]

## 2017-10-28 NOTE — Progress Notes (Signed)
Peripherally Inserted Central Catheter/Midline Placement  The IV Nurse has discussed with the patient and/or persons authorized to consent for the patient, the purpose of this procedure and the potential benefits and risks involved with this procedure.  The benefits include less needle sticks, lab draws from the catheter, and the patient may be discharged home with the catheter. Risks include, but not limited to, infection, bleeding, blood clot (thrombus formation), and puncture of an artery; nerve damage and irregular heartbeat and possibility to perform a PICC exchange if needed/ordered by physician.  Alternatives to this procedure were also discussed.  Bard Power PICC patient education guide, fact sheet on infection prevention and patient information card has been provided to patient /or left at bedside.    PICC/Midline Placement Documentation  PICC Single Lumen 10/28/17 PICC Right Brachial 40 cm 0 cm (Active)  Indication for Insertion or Continuance of Line Home intravenous therapies (PICC only) 10/28/2017  9:00 AM  Exposed Catheter (cm) 0 cm 10/28/2017  9:00 AM  Site Assessment Clean;Dry;Intact 10/28/2017  9:00 AM  Line Status Flushed;Blood return noted 10/28/2017  9:00 AM  Dressing Type Transparent 10/28/2017  9:00 AM  Dressing Status Clean;Dry;Intact;Antimicrobial disc in place 10/28/2017  9:00 AM  Dressing Intervention New dressing 10/28/2017  9:00 AM  Dressing Change Due 11/04/17 10/28/2017  9:00 AM       Jule Economy Horton 10/28/2017, 9:38 AM

## 2017-10-28 NOTE — Clinical Social Work Note (Addendum)
MD discharged patient for today to return to [facility]. PICC placed early this morning. CSW contacted Hilda Blades at [facility] and she is aware of return. CSW contacted [facility] and they are aware of return. Discharge information sent to both [facility] and [facility]. Nurse has called report and EMS. CSW spoke with patient and her husband and they are in agreement. Patient is not very fond of [facility] but will speak with [facility] after she returns to [facility]. Shela Leff MSW,LCSW [phone]

## 2017-10-28 NOTE — Progress Notes (Signed)
Pharmacy Antibiotic Note  Sara Pierce is a 76 y.o. female admitted on 10/23/2017 withbacteremia.  Pharmacy has been consulted for vancomycin dosing.  Plan: Will continue vancomycin 1250 mg iv q 24 hours. Will check further levels as clinically indicated to achieve a vancomycin trough of 15-20 mcg/ml. Would recommend next trough on 11/19 before dose. Renal function has been stable.    Height: 5\' 5"  (165.1 cm) Weight: 241 lb 11.2 oz (109.6 kg)(bed scale) IBW/kg (Calculated) : 57  Temp (24hrs), Avg:98.1 F (36.7 C), Min:97.9 F (36.6 C), Max:98.3 F (36.8 C)  Recent Labs  Lab 10/23/17 2256 10/24/17 0533 10/24/17 0745 10/26/17 0645 10/26/17 1536 10/27/17 0433 10/27/17 1506  WBC 9.4 8.6  --   --   --   --   --   CREATININE 0.67 0.62  --  0.52  --  0.68  --   LATICACIDVEN 2.0* 1.9 0.6  --   --   --   --   VANCOTROUGH  --   --   --   --  35*  --   --   VANCORANDOM  --   --   --   --   --   --  13    Estimated Creatinine Clearance: 76 mL/min (by C-G formula based on SCr of 0.68 mg/dL).    Allergies  Allergen Reactions  . Aspirin Swelling  . Codeine Itching  . Naprosyn [Naproxen] Other (See Comments)    Per MAR pt allergic  . Other     Elastic Bandages/ Supports Per MAR  . Tape Hives  . Valacyclovir     Antimicrobials this admission: Anti-infectives (From admission, onward)   Start     Dose/Rate Route Frequency Ordered Stop   10/28/17 0000  vancomycin 1,250 mg in sodium chloride 0.9 % 250 mL     1,250 mg 166.7 mL/hr over 90 Minutes Intravenous Every 24 hours 10/28/17 0725     10/27/17 1700  vancomycin (VANCOCIN) 1,250 mg in sodium chloride 0.9 % 250 mL IVPB     1,250 mg 166.7 mL/hr over 90 Minutes Intravenous Every 24 hours 10/27/17 1609     10/25/17 0400  vancomycin (VANCOCIN) 1,500 mg in sodium chloride 0.9 % 500 mL IVPB  Status:  Discontinued     1,500 mg 250 mL/hr over 120 Minutes Intravenous Every 12 hours 10/24/17 1916 10/26/17 1618   10/24/17 1900  vancomycin  (VANCOCIN) IVPB 1000 mg/200 mL premix     1,000 mg 200 mL/hr over 60 Minutes Intravenous  Once 10/24/17 1846 10/24/17 2104   10/24/17 1600  azithromycin (ZITHROMAX) tablet 250 mg  Status:  Discontinued     250 mg Oral Daily-1800 10/24/17 1443 10/24/17 1845   10/24/17 0430  cefTRIAXone (ROCEPHIN) 1 g in dextrose 5 % 50 mL IVPB - Premix  Status:  Discontinued     1 g 100 mL/hr over 30 Minutes Intravenous Every 24 hours 10/24/17 0413 10/24/17 1443   10/24/17 0430  azithromycin (ZITHROMAX) 500 mg in dextrose 5 % 250 mL IVPB  Status:  Discontinued     500 mg 250 mL/hr over 60 Minutes Intravenous Every 24 hours 10/24/17 0413 10/24/17 1443       Microbiology results: Recent Results (from the past 240 hour(s))  Blood Culture (routine x 2)     Status: Abnormal   Collection Time: 10/23/17 10:56 PM  Result Value Ref Range Status   Specimen Description BLOOD RIGHT HAND  Final   Special Requests  Final    BOTTLES DRAWN AEROBIC AND ANAEROBIC Blood Culture adequate volume   Culture  Setup Time   Final    AEROBIC BOTTLE ONLY GRAM POSITIVE COCCI CRITICAL RESULT CALLED TO, READ BACK BY AND VERIFIED WITH: CHRISTINE KATSOUDAS AT 2637 ON 10/24/2017 JJB    Culture METHICILLIN RESISTANT STAPHYLOCOCCUS AUREUS (A)  Final   Report Status 10/26/2017 FINAL  Final   Organism ID, Bacteria METHICILLIN RESISTANT STAPHYLOCOCCUS AUREUS  Final      Susceptibility   Methicillin resistant staphylococcus aureus - MIC*    CIPROFLOXACIN >=8 RESISTANT Resistant     ERYTHROMYCIN >=8 RESISTANT Resistant     GENTAMICIN <=0.5 SENSITIVE Sensitive     OXACILLIN RESISTANT Resistant     TETRACYCLINE <=1 SENSITIVE Sensitive     VANCOMYCIN 1 SENSITIVE Sensitive     TRIMETH/SULFA <=10 SENSITIVE Sensitive     CLINDAMYCIN <=0.25 SENSITIVE Sensitive     RIFAMPIN <=0.5 SENSITIVE Sensitive     Inducible Clindamycin NEGATIVE Sensitive     * METHICILLIN RESISTANT STAPHYLOCOCCUS AUREUS  Blood Culture ID Panel (Reflexed)      Status: Abnormal   Collection Time: 10/23/17 10:56 PM  Result Value Ref Range Status   Enterococcus species NOT DETECTED NOT DETECTED Final   Listeria monocytogenes NOT DETECTED NOT DETECTED Final   Staphylococcus species DETECTED (A) NOT DETECTED Final    Comment: CRITICAL RESULT CALLED TO, READ BACK BY AND VERIFIED WITH: CHRISTINE KATSOUDAS AT 8588 ON 10/24/2017 JJB    Staphylococcus aureus DETECTED (A) NOT DETECTED Final    Comment: Methicillin (oxacillin)-resistant Staphylococcus aureus (MRSA). MRSA is predictably resistant to beta-lactam antibiotics (except ceftaroline). Preferred therapy is vancomycin unless clinically contraindicated. Patient requires contact precautions if  hospitalized. CRITICAL RESULT CALLED TO, READ BACK BY AND VERIFIED WITH: CHRISTINE KATSOUDAS AT 5027 ON 10/24/2017 JJB    Methicillin resistance DETECTED (A) NOT DETECTED Final    Comment: CRITICAL RESULT CALLED TO, READ BACK BY AND VERIFIED WITH: CHRISTINE KATSOUDAS AT 7412 ON 10/24/2017 JJB    Streptococcus species NOT DETECTED NOT DETECTED Final   Streptococcus agalactiae NOT DETECTED NOT DETECTED Final   Streptococcus pneumoniae NOT DETECTED NOT DETECTED Final   Streptococcus pyogenes NOT DETECTED NOT DETECTED Final   Acinetobacter baumannii NOT DETECTED NOT DETECTED Final   Enterobacteriaceae species NOT DETECTED NOT DETECTED Final   Enterobacter cloacae complex NOT DETECTED NOT DETECTED Final   Escherichia coli NOT DETECTED NOT DETECTED Final   Klebsiella oxytoca NOT DETECTED NOT DETECTED Final   Klebsiella pneumoniae NOT DETECTED NOT DETECTED Final   Proteus species NOT DETECTED NOT DETECTED Final   Serratia marcescens NOT DETECTED NOT DETECTED Final   Haemophilus influenzae NOT DETECTED NOT DETECTED Final   Neisseria meningitidis NOT DETECTED NOT DETECTED Final   Pseudomonas aeruginosa NOT DETECTED NOT DETECTED Final   Candida albicans NOT DETECTED NOT DETECTED Final   Candida glabrata NOT  DETECTED NOT DETECTED Final   Candida krusei NOT DETECTED NOT DETECTED Final   Candida parapsilosis NOT DETECTED NOT DETECTED Final   Candida tropicalis NOT DETECTED NOT DETECTED Final  Blood Culture (routine x 2)     Status: None   Collection Time: 10/23/17 10:57 PM  Result Value Ref Range Status   Specimen Description BLOOD RIGHT WRIST  Final   Special Requests   Final    BOTTLES DRAWN AEROBIC AND ANAEROBIC Blood Culture adequate volume   Culture NO GROWTH 5 DAYS  Final   Report Status 10/28/2017 FINAL  Final  MRSA PCR Screening  Status: Abnormal   Collection Time: 10/24/17  5:18 AM  Result Value Ref Range Status   MRSA by PCR POSITIVE (A) NEGATIVE Final    Comment:        The GeneXpert MRSA Assay (FDA approved for NASAL specimens only), is one component of a comprehensive MRSA colonization surveillance program. It is not intended to diagnose MRSA infection nor to guide or monitor treatment for MRSA infections. RESULT CALLED TO, READ BACK BY AND VERIFIED WITH: Dorna Bloom @ 4665 11/02/17 [facility]   CULTURE, BLOOD (ROUTINE X 2) w Reflex to ID Panel     Status: None (Preliminary result)   Collection Time: 10/25/17  1:56 PM  Result Value Ref Range Status   Specimen Description BLOOD RAC  Final   Special Requests   Final    BOTTLES DRAWN AEROBIC AND ANAEROBIC Blood Culture adequate volume   Culture NO GROWTH 3 DAYS  Final   Report Status PENDING  Incomplete  CULTURE, BLOOD (ROUTINE X 2) w Reflex to ID Panel     Status: None (Preliminary result)   Collection Time: 10/25/17  1:56 PM  Result Value Ref Range Status   Specimen Description BLOOD LEFT ARM  Final   Special Requests   Final    BOTTLES DRAWN AEROBIC AND ANAEROBIC Blood Culture adequate volume   Culture NO GROWTH 3 DAYS  Final   Report Status PENDING  Incomplete     Thank you for allowing pharmacy to be a part of this patient's care.  Rayna Sexton, PharmD, BCPS Clinical Pharmacist 10/28/2017 8:58 AM

## 2017-10-28 NOTE — Discharge Summary (Signed)
[facility] [place] NAME: Sara Pierce    MR#:  [id number]  DATE OF BIRTH:  22-Apr-1941  DATE OF ADMISSION:  10/23/2017   ADMITTING PHYSICIAN: Saundra Shelling, MD  DATE OF DISCHARGE:  10/28/2017  PRIMARY CARE PHYSICIAN: System, Provider Not In   ADMISSION DIAGNOSIS:  Syncope [R55] Respiratory distress [R06.03] COPD exacerbation ([place]) [J44.1] DISCHARGE DIAGNOSIS:  Active Problems:   COPD with exacerbation ([place])   Pressure injury of skin  SECONDARY DIAGNOSIS:   Past Medical History:  Diagnosis Date  . Arthritis   . Cardiomyopathy ([place])   . Cataract   . Cerebral hemorrhage ([place])   . Chronic kidney disease   . Clotting disorder ([place])   . COPD (chronic obstructive pulmonary disease) ([place])   . Depression   . Diabetes mellitus without complication ([place])   . Hypertension   . Mixed incontinence   . Obesity   . Sleep apnea   . Thyroid disease   . Urinary frequency   . Vaginal atrophy   . Varicose veins    HOSPITAL COURSE:    1. Bacteremia with staph aureus 1 out of 4 bottles.  On IV vancomycin.  Per Dr. Ola Spurr and likely will need 4 weeks of IV antibiotics from 11/13.  Follow-up repeatrf blood cultures. Sedimentation rate: 39.  Echocardiogram is pending.  Because of her status, unlikely to get TEE.  Her recent elbow surgery could be a source.  Repeat blood culture is negative for 48 hours.  The patient will get PICC line today.  Since the vancomycin trough is elevated, Dr. Ola Spurr suggest adjusting vancomycin dose today. 2. Acute on chronic respiratory failure.  off BiPAP, on 4 L nasal cannula 3. COPD exacerbation.  Discontinue Solu-Medrol, changed to prednisone taper.  On DuoNeb and budesonide nebulizers. 4. Patient has an unstable compression fracture of the thoracic spine.  Back brace to be sent in from [facility].  Physical therapy may be needed to put on the brace.  Patient states she needs a Hoyer lift in order to  get in the brace to sit in a chair. 5. Sleep apnea on oxygen at night 6. Diabetic neuropathy on gabapentin 7. Type 2 diabetes on sliding scale 8. Hyperlipidemia unspecified on simvastatin 9. Depression on Wellbutrin and fluoxetine  DISCHARGE CONDITIONS:  Stable, discharged back to skilled nursing facility today. CONSULTS OBTAINED:  Treatment Team:  Leonel Ramsay, MD DRUG ALLERGIES:   Allergies  Allergen Reactions  . Aspirin Swelling  . Codeine Itching  . Naprosyn [Naproxen] Other (See Comments)    Per MAR pt allergic  . Other     Elastic Bandages/ Supports Per MAR  . Tape Hives  . Valacyclovir    DISCHARGE MEDICATIONS:   Allergies as of 10/28/2017      Reactions   Aspirin Swelling   Codeine Itching   Naprosyn [naproxen] Other (See Comments)   Per MAR pt allergic   Other    Elastic Bandages/ Supports Per MAR   Tape Hives   Valacyclovir       Medication List    STOP taking these medications   furosemide 40 MG tablet Commonly known as:  LASIX   levofloxacin 750 MG tablet Commonly known as:  LEVAQUIN   spironolactone 25 MG tablet Commonly known as:  ALDACTONE     TAKE these medications   acetaminophen 650 MG CR tablet Commonly known as:  TYLENOL Take 650 mg every 6 (six) hours by mouth.  scheduled   albuterol (2.5 MG/3ML) 0.083% nebulizer solution Commonly known as:  PROVENTIL Take 2.5 mg by nebulization every 4 (four) hours as needed for wheezing or shortness of breath.   albuterol 108 (90 Base) MCG/ACT inhaler Commonly known as:  PROVENTIL HFA;VENTOLIN HFA Inhale 2 puffs into the lungs every 6 (six) hours as needed for wheezing or shortness of breath.   alum & mag hydroxide-simeth 200-200-20 MG/5ML suspension Commonly known as:  MAALOX/MYLANTA Take 15 mLs by mouth every 6 (six) hours as needed for indigestion or heartburn.   ANORO ELLIPTA 62.5-25 MCG/INH Aepb Generic drug:  umeclidinium-vilanterol Inhale 1 puff into the lungs daily.     budesonide 0.5 MG/2ML nebulizer solution Commonly known as:  PULMICORT Take 2 mLs (0.5 mg total) 2 (two) times daily by nebulization.   buPROPion 150 MG 24 hr tablet Commonly known as:  WELLBUTRIN XL Take 150 mg by mouth daily.   cholecalciferol 1000 units tablet Commonly known as:  VITAMIN D Take 1,000 Units daily by mouth.   cyclobenzaprine 5 MG tablet Commonly known as:  FLEXERIL Take 5 mg by mouth 2 (two) times daily as needed for muscle spasms.   diazepam 5 MG tablet Commonly known as:  VALIUM Take 5 mg every 6 (six) hours as needed by mouth for anxiety.   diphenhydrAMINE 25 MG tablet Commonly known as:  BENADRYL Take 25 mg by mouth daily as needed (burning/ flushing).   docusate sodium 100 MG capsule Commonly known as:  COLACE Take 1 capsule (100 mg total) by mouth 2 (two) times daily as needed for mild constipation.   enoxaparin 40 MG/0.4ML injection Commonly known as:  LOVENOX Inject 40 mg daily into the skin.   ferrous sulfate 325 (65 FE) MG tablet Take 1 tablet (325 mg total) 2 (two) times daily with a meal by mouth.   FLUoxetine 40 MG capsule Commonly known as:  PROZAC Take 40 mg by mouth daily.   fluticasone 50 MCG/ACT nasal spray Commonly known as:  FLONASE Place 2 sprays into both nostrils daily.   gabapentin 300 MG capsule Commonly known as:  NEURONTIN Take 300 mg at bedtime by mouth.   guaiFENesin-dextromethorphan 100-10 MG/5ML syrup Commonly known as:  ROBITUSSIN DM Take 10 mLs by mouth every 6 (six) hours as needed for cough.   hydrOXYzine 25 MG tablet Commonly known as:  ATARAX/VISTARIL Take 25 mg by mouth 2 (two) times daily as needed for anxiety.   ipratropium-albuterol 0.5-2.5 (3) MG/3ML Soln Commonly known as:  DUONEB Inhale 3 mLs into the lungs every 6 (six) hours as needed (wheezing).   liothyronine 25 MCG tablet Commonly known as:  CYTOMEL Take 25 mcg by mouth daily.   liver oil-zinc oxide 40 % ointment Commonly known as:   DESITIN Apply 1 application daily topically. After bath   magnesium oxide 400 MG tablet Commonly known as:  MAG-OX Take 400 mg by mouth daily.   methocarbamol 750 MG tablet Commonly known as:  ROBAXIN Take 1,500 mg every 6 (six) hours by mouth.   montelukast 10 MG tablet Commonly known as:  SINGULAIR Take 10 mg by mouth at bedtime.   naloxone 4 MG/0.1ML Liqd nasal spray kit Commonly known as:  NARCAN Place 1 spray into the nose. Repeat every 3 minutes if no response.   nystatin cream Commonly known as:  MYCOSTATIN Apply 1 application topically 2 (two) times daily.   Oxycodone HCl 10 MG Tabs Take 10 mg every 6 (six) hours as needed by mouth  for moderate pain, severe pain or breakthrough pain.   polyethylene glycol packet Commonly known as:  MIRALAX / GLYCOLAX Take 17 g by mouth daily.   potassium chloride 10 MEQ tablet Commonly known as:  K-DUR Take 10 mEq by mouth daily.   predniSONE 10 MG tablet Commonly known as:  DELTASONE 30 mg po daily 1 day, 20 mg po daily 1 day, 10 mg po daily 1 day. What changed:    how much to take  how to take this  when to take this  additional instructions   pregabalin 50 MG capsule Commonly known as:  LYRICA Take 50 mg by mouth daily.   primidone 50 MG tablet Commonly known as:  MYSOLINE Take 50 mg by mouth 2 (two) times daily.   senna-docusate 8.6-50 MG tablet Commonly known as:  Senokot-S Take 2 tablets by mouth daily.   simvastatin 40 MG tablet Commonly known as:  ZOCOR Take 40 mg by mouth at bedtime.   sodium chloride 0.65 % Soln nasal spray Commonly known as:  OCEAN Place 2 sprays into both nostrils 3 (three) times daily as needed for congestion.   tiZANidine 4 MG tablet Commonly known as:  ZANAFLEX Take 4 mg by mouth every 12 (twelve) hours as needed for muscle spasms.   traMADol 50 MG tablet Commonly known as:  ULTRAM Take 50 mg by mouth every 6 (six) hours as needed for moderate pain.   vancomycin 1,250  mg in sodium chloride 0.9 % 250 mL Inject 1,250 mg daily into the vein.   Vitamin D (Ergocalciferol) 50000 units Caps capsule Commonly known as:  DRISDOL Take 50,000 Units every 7 (seven) days by mouth.        DISCHARGE INSTRUCTIONS:  See AVS.  If you experience worsening of your admission symptoms, develop shortness of breath, life threatening emergency, suicidal or homicidal thoughts you must seek medical attention immediately by calling 911 or calling your MD immediately  if symptoms less severe.  You Must read complete instructions/literature along with all the possible adverse reactions/side effects for all the Medicines you take and that have been prescribed to you. Take any new Medicines after you have completely understood and accpet all the possible adverse reactions/side effects.   Please note  You were cared for by a hospitalist during your hospital stay. If you have any questions about your discharge medications or the care you received while you were in the hospital after you are discharged, you can call the unit and asked to speak with the hospitalist on call if the hospitalist that took care of you is not available. Once you are discharged, your primary care physician will handle any further medical issues. Please note that NO REFILLS for any discharge medications will be authorized once you are discharged, as it is imperative that you return to your primary care physician (or establish a relationship with a primary care physician if you do not have one) for your aftercare needs so that they can reassess your need for medications and monitor your lab values.    On the day of Discharge:  VITAL SIGNS:  Blood pressure (!) 118/54, pulse (!) 55, temperature 98.3 F (36.8 C), resp. rate 14, height _0  (1.651 m), weight 241 lb 11.2 oz (109.6 kg), SpO2 99 %. PHYSICAL EXAMINATION:  GENERAL:  76 y.o.-year-old patient lying in the bed with no acute distress.  Obesity. EYES: Pupils  equal, round, reactive to light and accommodation. No scleral icterus. Extraocular muscles intact.  HEENT: Head atraumatic, normocephalic. Oropharynx and nasopharynx clear.  NECK:  Supple, no jugular venous distention. No thyroid enlargement, no tenderness.  LUNGS: Normal breath sounds bilaterally, no wheezing, rales,rhonchi or crepitation. No use of accessory muscles of respiration.  CARDIOVASCULAR: S1, S2 normal. No murmurs, rubs, or gallops.  ABDOMEN: Soft, non-tender, non-distended. Bowel sounds present. No organomegaly or mass.  EXTREMITIES: No pedal edema, cyanosis, or clubbing.  NEUROLOGIC: Cranial nerves II through XII are intact. Muscle strength 3-4/5 in all extremities. Sensation intact. Gait not checked.  PSYCHIATRIC: The patient is alert and oriented x 3.  SKIN: No obvious rash, lesion, or ulcer.  DATA REVIEW:   CBC Recent Labs  Lab 10/24/17 0533  WBC 8.6  HGB 10.7*  HCT 32.1*  PLT 224    Chemistries  Recent Labs  Lab 10/23/17 2256 10/24/17 0533  10/27/17 0433  NA 140 139  --   --   K 4.8 4.2  --   --   CL 97* 99*  --   --   CO2 32 30  --   --   GLUCOSE 180* 173*  --   --   BUN 20 18  --   --   CREATININE 0.67 0.62   < > 0.68  CALCIUM 9.4 9.0  --   --   AST 39  --   --   --   ALT 27  --   --   --   ALKPHOS 74  --   --   --   BILITOT 0.5  --   --   --    < > = values in this interval not displayed.     Microbiology Results  Results for orders placed or performed during the hospital encounter of 10/23/17  Blood Culture (routine x 2)     Status: Abnormal   Collection Time: 10/23/17 10:56 PM  Result Value Ref Range Status   Specimen Description BLOOD RIGHT HAND  Final   Special Requests   Final    BOTTLES DRAWN AEROBIC AND ANAEROBIC Blood Culture adequate volume   Culture  Setup Time   Final    AEROBIC BOTTLE ONLY GRAM POSITIVE COCCI CRITICAL RESULT CALLED TO, READ BACK BY AND VERIFIED WITH: CHRISTINE KATSOUDAS AT 5638 ON 10/24/2017 JJB    Culture  METHICILLIN RESISTANT STAPHYLOCOCCUS AUREUS (A)  Final   Report Status 10/26/2017 FINAL  Final   Organism ID, Bacteria METHICILLIN RESISTANT STAPHYLOCOCCUS AUREUS  Final      Susceptibility   Methicillin resistant staphylococcus aureus - MIC*    CIPROFLOXACIN >=8 RESISTANT Resistant     ERYTHROMYCIN >=8 RESISTANT Resistant     GENTAMICIN <=0.5 SENSITIVE Sensitive     OXACILLIN RESISTANT Resistant     TETRACYCLINE <=1 SENSITIVE Sensitive     VANCOMYCIN 1 SENSITIVE Sensitive     TRIMETH/SULFA <=10 SENSITIVE Sensitive     CLINDAMYCIN <=0.25 SENSITIVE Sensitive     RIFAMPIN <=0.5 SENSITIVE Sensitive     Inducible Clindamycin NEGATIVE Sensitive     * METHICILLIN RESISTANT STAPHYLOCOCCUS AUREUS  Blood Culture ID Panel (Reflexed)     Status: Abnormal   Collection Time: 10/23/17 10:56 PM  Result Value Ref Range Status   Enterococcus species NOT DETECTED NOT DETECTED Final   Listeria monocytogenes NOT DETECTED NOT DETECTED Final   Staphylococcus species DETECTED (A) NOT DETECTED Final    Comment: CRITICAL RESULT CALLED TO, READ BACK BY AND VERIFIED WITH: CHRISTINE KATSOUDAS AT [id number] ON 10/24/2017 JJB  Staphylococcus aureus DETECTED (A) NOT DETECTED Final    Comment: Methicillin (oxacillin)-resistant Staphylococcus aureus (MRSA). MRSA is predictably resistant to beta-lactam antibiotics (except ceftaroline). Preferred therapy is vancomycin unless clinically contraindicated. Patient requires contact precautions if  hospitalized. CRITICAL RESULT CALLED TO, READ BACK BY AND VERIFIED WITH: CHRISTINE KATSOUDAS AT 5956 ON 10/24/2017 JJB    Methicillin resistance DETECTED (A) NOT DETECTED Final    Comment: CRITICAL RESULT CALLED TO, READ BACK BY AND VERIFIED WITH: CHRISTINE KATSOUDAS AT 3875 ON 10/24/2017 JJB    Streptococcus species NOT DETECTED NOT DETECTED Final   Streptococcus agalactiae NOT DETECTED NOT DETECTED Final   Streptococcus pneumoniae NOT DETECTED NOT DETECTED Final    Streptococcus pyogenes NOT DETECTED NOT DETECTED Final   Acinetobacter baumannii NOT DETECTED NOT DETECTED Final   Enterobacteriaceae species NOT DETECTED NOT DETECTED Final   Enterobacter cloacae complex NOT DETECTED NOT DETECTED Final   Escherichia coli NOT DETECTED NOT DETECTED Final   Klebsiella oxytoca NOT DETECTED NOT DETECTED Final   Klebsiella pneumoniae NOT DETECTED NOT DETECTED Final   Proteus species NOT DETECTED NOT DETECTED Final   Serratia marcescens NOT DETECTED NOT DETECTED Final   Haemophilus influenzae NOT DETECTED NOT DETECTED Final   Neisseria meningitidis NOT DETECTED NOT DETECTED Final   Pseudomonas aeruginosa NOT DETECTED NOT DETECTED Final   Candida albicans NOT DETECTED NOT DETECTED Final   Candida glabrata NOT DETECTED NOT DETECTED Final   Candida krusei NOT DETECTED NOT DETECTED Final   Candida parapsilosis NOT DETECTED NOT DETECTED Final   Candida tropicalis NOT DETECTED NOT DETECTED Final  Blood Culture (routine x 2)     Status: None   Collection Time: 10/23/17 10:57 PM  Result Value Ref Range Status   Specimen Description BLOOD RIGHT WRIST  Final   Special Requests   Final    BOTTLES DRAWN AEROBIC AND ANAEROBIC Blood Culture adequate volume   Culture NO GROWTH 5 DAYS  Final   Report Status 10/28/2017 FINAL  Final  MRSA PCR Screening     Status: Abnormal   Collection Time: 10/24/17  5:18 AM  Result Value Ref Range Status   MRSA by PCR POSITIVE (A) NEGATIVE Final    Comment:        The GeneXpert MRSA Assay (FDA approved for NASAL specimens only), is one component of a comprehensive MRSA colonization surveillance program. It is not intended to diagnose MRSA infection nor to guide or monitor treatment for MRSA infections. RESULT CALLED TO, READ BACK BY AND VERIFIED WITH: Dorna Bloom @ 6433 11/02/17 [facility]   CULTURE, BLOOD (ROUTINE X 2) w Reflex to ID Panel     Status: None (Preliminary result)   Collection Time: 10/25/17  1:56 PM  Result Value Ref  Range Status   Specimen Description BLOOD RAC  Final   Special Requests   Final    BOTTLES DRAWN AEROBIC AND ANAEROBIC Blood Culture adequate volume   Culture NO GROWTH 3 DAYS  Final   Report Status PENDING  Incomplete  CULTURE, BLOOD (ROUTINE X 2) w Reflex to ID Panel     Status: None (Preliminary result)   Collection Time: 10/25/17  1:56 PM  Result Value Ref Range Status   Specimen Description BLOOD LEFT ARM  Final   Special Requests   Final    BOTTLES DRAWN AEROBIC AND ANAEROBIC Blood Culture adequate volume   Culture NO GROWTH 3 DAYS  Final   Report Status PENDING  Incomplete    RADIOLOGY:  Dg Elbow 2 Views  Left  Result Date: 10/27/2017 CLINICAL DATA:  Left elbow pain after fall 2 weeks ago. EXAM: LEFT ELBOW - 2 VIEW COMPARISON:  Radiographs of August 19, 2017. FINDINGS: Status post surgical internal fixation of comminuted unhealed fracture of distal left humerus. Status post surgical internal fixation of proximal ulna is noted as well. No acute fracture or dislocation is noted. The cast has been removed. No soft tissue abnormalities noted. IMPRESSION: Postsurgical changes as described above. No acute abnormality seen in the left elbow. Electronically Signed   By: Marijo Conception, M.D.   On: 10/27/2017 13:58   Dg Wrist Complete Left  Result Date: 10/27/2017 CLINICAL DATA:  Left wrist pain after fall 2 weeks ago. EXAM: LEFT WRIST - COMPLETE 3+ VIEW COMPARISON:  Radiographs of August 12, 2017. FINDINGS: There is no evidence of fracture or dislocation. There is no evidence of arthropathy or other focal bone abnormality. Soft tissues are unremarkable. IMPRESSION: No significant abnormality seen in the left wrist. Electronically Signed   By: Marijo Conception, M.D.   On: 10/27/2017 13:56     Management plans discussed with the patient, family and they are in agreement.  CODE STATUS: Full Code   TOTAL TIME TAKING CARE OF THIS PATIENT: 37 minutes.    Demetrios Loll M.D on 10/28/2017 at  7:31 AM  Between 7am to 6pm - Pager - [phone]  After 6pm go to [email or web address] - Proofreader  [facility]  Office  [phone]  CC: Primary care physician; System, Provider Not In   Note: This dictation was prepared with Dragon dictation along with smaller phrase technology. Any transcriptional errors that result from this process are unintentional.

## 2017-10-28 NOTE — Care Management Important Message (Signed)
Important Message  Patient Details  Name: SAYWARD HORVATH MRN: [id number] Date of Birth: 21-May-1941   Medicare Important Message Given:  Yes    Shelbie Ammons, RN 10/28/2017, 12:23 PM

## 2017-10-30 LAB — CULTURE, BLOOD (ROUTINE X 2)
CULTURE: NO GROWTH
Culture: NO GROWTH
SPECIAL REQUESTS: ADEQUATE
Special Requests: ADEQUATE

## 2017-11-25 ENCOUNTER — Emergency Department
Admission: EM | Admit: 2017-11-25 | Discharge: 2017-11-26 | Disposition: A | Discharge status: Home / Self Care | Payer: Medicare (Managed Care) | Attending: Emergency Medicine | Admitting: Emergency Medicine

## 2017-11-25 ENCOUNTER — Emergency Department: Payer: Medicare (Managed Care)

## 2017-11-25 ENCOUNTER — Other Ambulatory Visit: Payer: Self-pay

## 2017-11-25 DIAGNOSIS — Z7901 Long term (current) use of anticoagulants: Secondary | ICD-10-CM | POA: Diagnosis not present

## 2017-11-25 DIAGNOSIS — R053 Chronic cough: Secondary | ICD-10-CM

## 2017-11-25 DIAGNOSIS — E1122 Type 2 diabetes mellitus with diabetic chronic kidney disease: Secondary | ICD-10-CM | POA: Insufficient documentation

## 2017-11-25 DIAGNOSIS — I129 Hypertensive chronic kidney disease with stage 1 through stage 4 chronic kidney disease, or unspecified chronic kidney disease: Secondary | ICD-10-CM | POA: Insufficient documentation

## 2017-11-25 DIAGNOSIS — R05 Cough: Secondary | ICD-10-CM | POA: Diagnosis not present

## 2017-11-25 DIAGNOSIS — R600 Localized edema: Secondary | ICD-10-CM | POA: Diagnosis not present

## 2017-11-25 DIAGNOSIS — R0603 Acute respiratory distress: Secondary | ICD-10-CM | POA: Diagnosis present

## 2017-11-25 DIAGNOSIS — Z79899 Other long term (current) drug therapy: Secondary | ICD-10-CM | POA: Diagnosis not present

## 2017-11-25 DIAGNOSIS — N189 Chronic kidney disease, unspecified: Secondary | ICD-10-CM | POA: Diagnosis not present

## 2017-11-25 DIAGNOSIS — J441 Chronic obstructive pulmonary disease with (acute) exacerbation: Secondary | ICD-10-CM | POA: Diagnosis not present

## 2017-11-25 DIAGNOSIS — Z87891 Personal history of nicotine dependence: Secondary | ICD-10-CM | POA: Insufficient documentation

## 2017-11-25 LAB — CBC WITH DIFFERENTIAL/PLATELET
BASOS ABS: 0 10*3/uL (ref 0–0.1)
Basophils Relative: 0 %
EOS PCT: 0 %
Eosinophils Absolute: 0 10*3/uL (ref 0–0.7)
HEMATOCRIT: 35.3 % (ref 35.0–47.0)
HEMOGLOBIN: 11.5 g/dL — AB (ref 12.0–16.0)
LYMPHS ABS: 2 10*3/uL (ref 1.0–3.6)
LYMPHS PCT: 18 %
MCH: 30.1 pg (ref 26.0–34.0)
MCHC: 32.5 g/dL (ref 32.0–36.0)
MCV: 92.7 fL (ref 80.0–100.0)
Monocytes Absolute: 0.6 10*3/uL (ref 0.2–0.9)
Monocytes Relative: 6 %
NEUTROS ABS: 8 10*3/uL — AB (ref 1.4–6.5)
Neutrophils Relative %: 76 %
PLATELETS: 245 10*3/uL (ref 150–440)
RBC: 3.81 MIL/uL (ref 3.80–5.20)
RDW: 15.5 % — ABNORMAL HIGH (ref 11.5–14.5)
WBC: 10.7 10*3/uL (ref 3.6–11.0)

## 2017-11-25 LAB — BRAIN NATRIURETIC PEPTIDE: B NATRIURETIC PEPTIDE 5: 199 pg/mL — AB (ref 0.0–100.0)

## 2017-11-25 LAB — BASIC METABOLIC PANEL
ANION GAP: 11 (ref 5–15)
BUN: 18 mg/dL (ref 6–20)
CHLORIDE: 96 mmol/L — AB (ref 101–111)
CO2: 31 mmol/L (ref 22–32)
Calcium: 9.8 mg/dL (ref 8.9–10.3)
Creatinine, Ser: 0.6 mg/dL (ref 0.44–1.00)
GFR calc Af Amer: >60 mL/min (ref >60)
GFR calc non Af Amer: >60 mL/min (ref >60)
GLUCOSE: 108 mg/dL — AB (ref 65–99)
POTASSIUM: 4.2 mmol/L (ref 3.5–5.1)
Sodium: 138 mmol/L (ref 135–145)

## 2017-11-25 LAB — TROPONIN I: Troponin I: 0.03 ng/mL (ref <0.03)

## 2017-11-25 MED ORDER — IPRATROPIUM-ALBUTEROL 0.5-2.5 (3) MG/3ML IN SOLN
3.0000 mL | Freq: Once | RESPIRATORY_TRACT | Status: AC
  Administered 2017-11-25: 3 mL via RESPIRATORY_TRACT
  Filled 2017-11-25: qty 6

## 2017-11-25 MED ORDER — IPRATROPIUM-ALBUTEROL 0.5-2.5 (3) MG/3ML IN SOLN
3.0000 mL | Freq: Once | RESPIRATORY_TRACT | Status: AC
  Administered 2017-11-25: 3 mL via RESPIRATORY_TRACT
  Filled 2017-11-25: qty 3

## 2017-11-25 NOTE — ED Notes (Signed)
Critical troponin of 0.03 called from lab. Dr. Jacqualine Code notified.

## 2017-11-25 NOTE — ED Notes (Signed)
Pt with left arm splint in place from mid upper arm to wrist. Palpable pulse noted to radius beneath splint. Pt is able to move all fingers. Pt states she broke her left arm and her "back" when she fell "a couple of weeks ago". Pt states she is not to bend her back above 30 degrees post fall. Pt states does have some orthopnea, but then states she is not supposed to be flat due to back fracture.

## 2017-11-25 NOTE — ED Provider Notes (Signed)
[facility] Emergency Department Provider Note ____________________________________________   First MD Initiated Contact with Patient 11/25/17 2303     (approximate)  I have reviewed the triage vital signs and the nursing notes.  HISTORY  Chief Complaint Respiratory Distress  HPI Sara Pierce is a 76 y.o. female history of COPD, cardiomyopathy cranial hemorrhage diabetes  Presents for evaluation of coughing.  Patient reports she has had a cough for at least 2 weeks now, she has seen her doctor for this and she reports that they did not prescribe her any medications or start any new medication.  She does report that she continues to have ongoing cough with feeling of shortness of breath.  No pain associated.  She has noticed some slight swelling in both lower legs, no nausea or vomiting.  No fevers or chills.  No chest pain.  Chronic pain left upper arm and lower back after a fall which she has had evaluated and she keeps a left arm sling.  Had her PICC line removed just a couple of days ago and has discontinued vancomycin which she was on for Strep infection  Past Medical History:  Diagnosis Date  . Arthritis   . Cardiomyopathy ([place])   . Cataract   . Cerebral hemorrhage ([place])   . Chronic kidney disease   . Clotting disorder ([place])   . COPD (chronic obstructive pulmonary disease) ([place])   . Depression   . Diabetes mellitus without complication ([place])   . Hypertension   . Mixed incontinence   . Obesity   . Sleep apnea   . Thyroid disease   . Urinary frequency   . Vaginal atrophy   . Varicose veins     Patient Active Problem List   Diagnosis Date Noted  . Pressure injury of skin 10/25/2017  . COPD with exacerbation ([place]) 10/24/2017  . HCAP (healthcare-associated pneumonia) 08/19/2017  . CAP (community acquired pneumonia) 10/03/2016  . Sepsis ([place]) 11/15/2015  . Absolute anemia 03/15/2015  . Anxiety 03/15/2015  . Arthritis 03/15/2015  .  Cardiomyopathy, secondary ([place]) 03/15/2015  . Cataract 03/15/2015  . Brain bleed ([place]) 03/15/2015  . Chronic constipation 03/15/2015  . Chronic kidney failure 03/15/2015  . Blood clotting disorder ([place]) 03/15/2015  . CAFL (chronic airflow limitation) ([place]) 03/15/2015  . Clinical depression 03/15/2015  . Diabetes ([place]) 03/15/2015  . H/O varicella 03/15/2015  . BP (high blood pressure) 03/15/2015  . Mixed incontinence 03/15/2015  . 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 Adiposity 03/15/2015  . Apnea, sleep 03/15/2015  . Disease of thyroid gland 03/15/2015  . FOM (frequency of micturition) 03/15/2015  . 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 Candida vaginitis 03/15/2015  . Abnormal ECG 03/15/2015    Past Surgical History:  Procedure Laterality Date  . APPENDECTOMY    . Cardiac Catherization    . COLONOSCOPY WITH PROPOFOL N/A 04/28/2016   Procedure: COLONOSCOPY WITH PROPOFOL;  Surgeon: Manya Silvas, MD;  Location[facility] ENDOSCOPY;  Service: Endoscopy;  Laterality: N/A;  . COLONOSCOPY WITH PROPOFOL N/A 04/29/2016   Procedure: COLONOSCOPY WITH PROPOFOL;  Surgeon: Manya Silvas, MD;  Location[facility] ENDOSCOPY;  Service: Endoscopy;  Laterality: N/A;  . JOINT REPLACEMENT    . STRABISMUS SURGERY    . TONSILLECTOMY    . TUBAL LIGATION      Prior to Admission medications   Medication Sig Start Date End Date Taking? Authorizing Provider  acetaminophen (TYLENOL) 650 MG CR tablet Take 650  mg every 6 (six) hours by mouth. scheduled   Yes [provider]  albuterol (PROVENTIL) (2.5 MG/3ML) 0.083% nebulizer solution Take 2.5 mg by nebulization every 4 (four) hours as needed for wheezing or shortness of breath.   Yes [provider]  alum & mag hydroxide-simeth (MAALOX/MYLANTA) 200-200-20 MG/5ML suspension Take 15 mLs by mouth every 6 (six) hours as needed for indigestion or heartburn.   Yes [provider]  budesonide (PULMICORT)  0.5 MG/2ML nebulizer solution Take 2 mLs (0.5 mg total) 2 (two) times daily by nebulization. 10/28/17  Yes Demetrios Loll, MD  buPROPion (WELLBUTRIN XL) 150 MG 24 hr tablet Take 150 mg by mouth daily.   Yes [provider]  cholecalciferol (VITAMIN D) 1000 units tablet Take 1,000 Units daily by mouth.   Yes [provider]  cyclobenzaprine (FLEXERIL) 5 MG tablet Take 5 mg by mouth 2 (two) times daily as needed for muscle spasms.   Yes [provider]  diazepam (VALIUM) 5 MG tablet Take 5 mg every 6 (six) hours as needed by mouth for anxiety.   Yes [provider]  diphenhydrAMINE (BENADRYL) 25 MG tablet Take 25 mg by mouth daily as needed (burning/ flushing).   Yes [provider]  enoxaparin (LOVENOX) 40 MG/0.4ML injection Inject 40 mg daily into the skin.   Yes [provider]  ferrous sulfate 325 (65 FE) MG tablet Take 1 tablet (325 mg total) 2 (two) times daily with a meal by mouth. 10/28/17  Yes Demetrios Loll, MD  FLUoxetine (PROZAC) 40 MG capsule Take 40 mg by mouth daily.   Yes [provider]  fluticasone (FLONASE) 50 MCG/ACT nasal spray Place 2 sprays into both nostrils daily.    Yes [provider]  gabapentin (NEURONTIN) 300 MG capsule Take 300 mg at bedtime by mouth.   Yes [provider]  hydrOXYzine (ATARAX/VISTARIL) 25 MG tablet Take 25 mg by mouth 2 (two) times daily as needed for anxiety.   Yes [provider]  ipratropium-albuterol (DUONEB) 0.5-2.5 (3) MG/3ML SOLN Inhale 3 mLs into the lungs every 6 (six) hours as needed (wheezing).    Yes [provider]  liothyronine (CYTOMEL) 25 MCG tablet Take 25 mcg by mouth daily.    Yes [provider]  liver oil-zinc oxide (DESITIN) 40 % ointment Apply 1 application daily topically. After bath   Yes [provider]  magnesium oxide (MAG-OX) 400 MG tablet Take 400 mg by mouth daily.    Yes [provider]  methocarbamol  (ROBAXIN) 750 MG tablet Take 1,500 mg every 6 (six) hours by mouth.   Yes [provider]  montelukast (SINGULAIR) 10 MG tablet Take 10 mg by mouth at bedtime.    Yes [provider]  nystatin cream (MYCOSTATIN) Apply 1 application topically 2 (two) times daily.   Yes [provider]  Oxycodone HCl 10 MG TABS Take 10 mg every 6 (six) hours as needed by mouth for moderate pain, severe pain or breakthrough pain.    Yes [provider]  polyethylene glycol (MIRALAX / GLYCOLAX) packet Take 17 g by mouth daily.   Yes [provider]  potassium chloride (K-DUR) 10 MEQ tablet Take 10 mEq by mouth daily.    Yes [provider]  pregabalin (LYRICA) 50 MG capsule Take 50 mg by mouth daily.   Yes [provider]  primidone (MYSOLINE) 50 MG tablet Take 50 mg by mouth 2 (two) times daily.    Yes  [provider]  senna-docusate (SENOKOT-S) 8.6-50 MG tablet Take 2 tablets by mouth daily.   Yes [provider]  simvastatin (ZOCOR) 40 MG tablet Take 40 mg by mouth at bedtime.    Yes [provider]  tiZANidine (ZANAFLEX) 4 MG tablet Take 4 mg by mouth every 12 (twelve) hours as needed for muscle spasms.   Yes [provider]  traMADol (ULTRAM) 50 MG tablet Take 50 mg by mouth every 6 (six) hours as needed for moderate pain.   Yes [provider]  umeclidinium-vilanterol (ANORO ELLIPTA) 62.5-25 MCG/INH AEPB Inhale 1 puff into the lungs daily.   Yes [provider]  Vitamin D, Ergocalciferol, (DRISDOL) 50000 units CAPS capsule Take 50,000 Units every 7 (seven) days by mouth.   Yes [provider]  albuterol (PROVENTIL HFA;VENTOLIN HFA) 108 (90 Base) MCG/ACT inhaler Inhale 2 puffs into the lungs every 6 (six) hours as needed for wheezing or shortness of breath. Patient not taking: Reported on 10/24/2017 06/15/17   Rudene Re, MD  docusate sodium (COLACE) 100 MG capsule Take 1 capsule (100  mg total) by mouth 2 (two) times daily as needed for mild constipation. Patient not taking: Reported on 09/11/2017 10/04/16   Nicholes Mango, MD  guaiFENesin-dextromethorphan (ROBITUSSIN DM) 100-10 MG/5ML syrup Take 10 mLs by mouth every 6 (six) hours as needed for cough. Patient not taking: Reported on 10/24/2017 10/04/16   Nicholes Mango, MD  naloxone [facility]) nasal spray 4 mg/0.1 mL Place 1 spray into the nose. Repeat every 3 minutes if no response.    [provider]  predniSONE (DELTASONE) 10 MG tablet 30 mg po daily 1 day, 20 mg po daily 1 day, 10 mg po daily 1 day. Patient not taking: Reported on 11/25/2017 10/28/17   Demetrios Loll, MD  sodium chloride (OCEAN) 0.65 % SOLN nasal spray Place 2 sprays into both nostrils 3 (three) times daily as needed for congestion. Patient not taking: Reported on 10/24/2017 10/04/16   Nicholes Mango, MD  vancomycin 1,250 mg in sodium chloride 0.9 % 250 mL Inject 1,250 mg daily into the vein. Patient not taking: Reported on 11/25/2017 10/28/17   Demetrios Loll, MD    Allergies Aspirin; Codeine; Naprosyn [naproxen]; Other; Tape; and Valacyclovir  Family History  Problem Relation Age of Onset  . Heart attack Father   . Coronary artery disease Father   . Hypertension Father   . Hyperlipidemia Father   . Heart attack Mother   . Hypertension Mother   . Hyperlipidemia Mother   . Breast cancer Neg Hx     Social History Social History   Tobacco Use  . Smoking status: Former Smoker    Packs/day: 0.50    Years: 44.00    Pack years: 22.00    Types: Cigarettes  . Smokeless tobacco: Never Used  Substance Use Topics  . Alcohol use: No  . Drug use: No    Review of Systems Constitutional: No fever/chills but does report increased fatigue for the last 2 weeks Eyes: No visual changes. ENT: No sore throat. Cardiovascular: Denies chest pain. Respiratory: Frequent, productive cough with sputum.  Reports shortness of breath, uses 3 L oxygen at baseline.   Reports she is coughing frequently and cannot seem to clear her cough well.  Denies wheezing.  Has also noticed swelling in her lower legs for the last month and slight weight gain Gastrointestinal: No abdominal pain.  No nausea, no vomiting.  No diarrhea.  No constipation. Genitourinary: Negative for  dysuria. Musculoskeletal: Pain in her back after a fall a month ago, also in her left arm and left shoulder which is been present for at least a month now wears a sling Skin: Negative for rash. Neurological: Negative for headaches, focal weakness or numbness.    ____________________________________________   PHYSICAL EXAM:  VITAL SIGNS: ED Triage Vitals  Enc Vitals Group     BP 11/25/17 2234 97/77     Pulse Rate 11/25/17 2234 94     Resp 11/25/17 2234 16     Temp 11/25/17 2234 97.9 F (36.6 C)     Temp Source 11/25/17 2234 Oral     SpO2 11/25/17 2234 96 %     Weight 11/25/17 2236 260 lb (117.9 kg)     Height 11/25/17 2236 5\' 5"  (1.651 m)     Head Circumference --      Peak Flow --      Pain Score 11/25/17 2241 8     Pain Loc --      Pain Edu? --      Excl. in [place]? --     Constitutional: Alert and well oriented to situation, place, time.  There is mildly dyspnea, with frequent cough noted, but no distress. Eyes: Conjunctivae are normal. Head: Atraumatic. Nose: No congestion/rhinnorhea. Mouth/Throat: Mucous membranes are moist. Neck: No stridor.   Cardiovascular: Normal rate, regular rhythm. Grossly normal heart sounds.  Good peripheral circulation. Respiratory: No tachypnea, patient does have mild use of accessory muscles.  There is frequent rhonchi throughout both central lung fields without notable wheezing and peripheral fields appear clear bilateral.  No focal crackles are noted.  She is able to speak in long phrases. Gastrointestinal: Soft and nontender. No distention. Musculoskeletal: Mild bilteral lower extremity tenderness nor edema.  Full use of the right upper  extremity without deficit or discomfort.  Left upper extremity is splinted, normal capillary refill and radial pulse.  Able to wiggle her fingers well without any deficits noted Neurologic:  Normal speech and language. No gross focal neurologic deficits are appreciated.  Skin:  Skin is warm, dry and intact. No rash noted. Psychiatric: Mood and affect are normal. Speech and behavior are normal.  ____________________________________________   LABS (all labs ordered are listed, but only abnormal results are displayed)  Labs Reviewed  CBC WITH DIFFERENTIAL/PLATELET - Abnormal; Notable for the following components:      Result Value   Hemoglobin 11.5 (*)    RDW 15.5 (*)    Neutro Abs 8.0 (*)    All other components within normal limits  BASIC METABOLIC PANEL - Abnormal; Notable for the following components:   Chloride 96 (*)    Glucose, Bld 108 (*)    All other components within normal limits  TROPONIN I - Abnormal; Notable for the following components:   Troponin I 0.03 (*)    All other components within normal limits  BRAIN NATRIURETIC PEPTIDE - Abnormal; Notable for the following components:   B Natriuretic Peptide 199.0 (*)    All other components within normal limits  TROPONIN I   ____________________________________________  EKG  Reviewed interrupt me at 2235 Heart rate 100 QRS 140 QTC 500 Normal sinus rhythm, right bundle branch block, moderate baseline artifact, no evidence of acute ischemia noted ____________________________________________  RADIOLOGY  Dg Chest 1 View  Result Date: 11/25/2017 CLINICAL DATA:  Dyspnea and respiratory distress. EXAM: CHEST 1 VIEW COMPARISON:  10/28/2017 FINDINGS: Heart is top-normal in size. There is moderate aortic atherosclerosis. PICC line  has been removed since prior exam. Stable mild hilar prominence on the right likely vascular in etiology. There is atelectasis at the left lung base. No overt pulmonary edema or pneumonic  consolidation. Old fracture deformity of the left surgical neck. Partially visualized right shoulder arthroplasty. IMPRESSION: Atelectasis at the left lung base. Aortic atherosclerosis. No active pulmonary disease. Electronically Signed   By: Ashley Royalty M.D.   On: 11/25/2017 23:47   Ct Angio Chest Pe W And/or Wo Contrast  Result Date: 11/26/2017 CLINICAL DATA:  Chronic dyspnea. EXAM: CT ANGIOGRAPHY CHEST WITH CONTRAST TECHNIQUE: Multidetector CT imaging of the chest was performed using the standard protocol during bolus administration of intravenous contrast. Multiplanar CT image reconstructions and MIPs were obtained to evaluate the vascular anatomy. CONTRAST:  76mL ISOVUE-370 IOPAMIDOL (ISOVUE-370) INJECTION 76% COMPARISON:  Radiograph yesterday.  Chest CT 10/24/2017 FINDINGS: Cardiovascular: There are no filling defects within the pulmonary arteries to suggest pulmonary embolus. Dilated main pulmonary artery measuring 4.2 cm. Tortuous aorta with atherosclerosis. No aortic dissection. There are coronary artery calcifications. Heart size upper normal. Mediastinum/Nodes: No enlarged mediastinal or hilar nodes. No axillary adenopathy. The esophagus is decompressed. No visualized thyroid nodule. Lungs/Pleura: Dependent right upper lobe atelectasis with some improvement from prior exam. Persistent but improved bilateral lower lobe atelectasis. No new airspace disease. No pulmonary edema. Trace right pleural effusion. Upper Abdomen: No acute abnormality. Unchanged bilateral adrenal nodularity. Musculoskeletal: Unchanged T5 and T6 vertebral body fractures with unchanged retropulsion of T6. Remote right anterior rib fractures. Review of the MIP images confirms the above findings. IMPRESSION: 1. No pulmonary embolus. 2. Dilated main pulmonary arteries consistent with pulmonary arterial hypertension. 3. Aortic atherosclerosis and coronary artery calcifications. 4. Multifocal atelectasis with some improvement from  prior CT. Aortic Atherosclerosis (ICD10-I70.0). Electronically Signed   By: Jeb Levering M.D.   On: 11/26/2017 02:57   Chest x-ray reviewed, no acute  Chest CT reviewed, no PE, mild pulmonary artery dilation.  Some atelectasis  ____________________________________________   PROCEDURES  Procedure(s) performed: None  Procedures  Critical Care performed: No  ____________________________________________   INITIAL IMPRESSION / ASSESSMENT AND PLAN / ED COURSE  Pertinent labs & imaging results that were available during my care of the patient were reviewed by me and considered in my medical decision making (see chart for details).  Patient presents for evaluation of a cough which she said has been present for about 1 week.  Appears she has been treated with a course of Levaquin, also currently on prednisone and inhalers/nebulizers.  She does not appear in any acute distress, does have some central rhonchi.  She appears to be at her baseline oxygen saturation on 3 L which she has at home.  She reports being discharged from skilled nursing facility earlier in the day.  She reports she would like to be further evaluated to make sure she does not have any ongoing pneumonia.  She reports that her shortness of breath has been very steady with a persistent cough for a week without significant worsening.  No associated chest pain.  EKG reassuring, no evidence of acute ischemia.  She does report ongoing cough despite treatments for COPD including antibiotic and prednisone, given her immobility and recent injury will obtain a CT angiography of the chest to exclude pulmonary embolism as an etiology.  Does not appear to be an acute cardiac issue.  Lacks infectious signs or symptoms such as a fever or leukocytosis.  Clinical Course as of Nov 26 726  Sat Nov 26, 2017  0023 Dr. Meredith Staggers on-call for [facility] notified by [facility]. They advise discharge, if discharged [facility] will provide close follow-up.   [MQ]    [id number] Dr. Meredith Staggers reports patient left SNF today and recently stopped 4 week Vancomycin for questionable MRSA. Treated with levaquin for similar cough 1 week ago. Dr. Meredith Staggers is going to see her tomorrow morning at her home.   [MQ]  [id number] Paged PCP (Dr. Meredith Staggers), to discuss plan of care.   [MQ]    Clinical Course User Index [MQ] Delman Kitten, MD   ----------------------------------------- 5:01 AM on 11/26/2017 -----------------------------------------  Awaiting callback from PCP Dr. Meredith Staggers.  Patient does not appear to have any acute criteria for admission to the hospital, but was recently released from rehab yesterday.  I discussed with the patient that I would like for her to wait for social work consultation to consider additional options beyond staying at her home, but the patient is adamant that she wishes to be discharged home and would not want to go to or have a social work consultation for evaluation at assisted living or back to the rehab center.  She would like to be discharged home to follow-up with her [facility] physician this morning as scheduled.  ----------------------------------------- 7:26 AM on 11/26/2017 -----------------------------------------  Patient continues to rest comfortably, normal oxygen saturation and vital signs.  She appears stable, we have a plan in place for her to follow-up with the [facility] today Dr. Meredith Staggers notes that they will see her at her own home this morning.  Patient currently on prednisone, has just finished Levaquin, discussed with Dr. Meredith Staggers and I did notify him I was going to give her a dose of Lasix given her associated pedal edema and elevated BNP though she shows no evidence of pulmonary edema or hypoxia.  Dr. Meredith Staggers agreeable, will follow up with patient this morning      ____________________________________________   FINAL CLINICAL IMPRESSION(S) / ED DIAGNOSES  Final diagnoses:  Chronic cough  COPD exacerbation ([place])  Pedal edema       NEW MEDICATIONS STARTED DURING THIS VISIT:  This SmartLink is deprecated. Use AVSMEDLIST instead to display the medication list for a patient.   Note:  This document was prepared using Dragon voice recognition software and may include unintentional dictation errors.     Delman Kitten, MD 11/26/17 [phone]

## 2017-11-25 NOTE — ED Triage Notes (Signed)
Pt arrived via EMS from home d/t respiratory distress x1 week. EMD reports pt has hx of COPD and wears 3L of oxygen at home. Pt was given a duoneb and 125mg  of solumedrol prior to arrival. Pt reports she currently has a broken back and broken left arm. Pt is A&O x4 at this time.

## 2017-11-26 ENCOUNTER — Emergency Department: Payer: Medicare (Managed Care)

## 2017-11-26 LAB — TROPONIN I: Troponin I: <0.03 ng/mL (ref <0.03)

## 2017-11-26 MED ORDER — IOPAMIDOL (ISOVUE-300) INJECTION 61%: 100.0000 mL | Freq: Once | INTRAVENOUS | Status: DC | PRN

## 2017-11-26 MED ORDER — OXYCODONE HCL 5 MG PO TABS
5.0000 mg | ORAL_TABLET | ORAL | Status: AC
  Administered 2017-11-26: 5 mg via ORAL
  Filled 2017-11-26: qty 1

## 2017-11-26 MED ORDER — IOPAMIDOL (ISOVUE-370) INJECTION 76%
75.0000 mL | Freq: Once | INTRAVENOUS | Status: AC | PRN
  Administered 2017-11-26: 75 mL via INTRAVENOUS

## 2017-11-26 MED ORDER — FUROSEMIDE 40 MG PO TABS
20.0000 mg | ORAL_TABLET | Freq: Once | ORAL | Status: AC
  Administered 2017-11-26: 20 mg via ORAL
  Filled 2017-11-26: qty 1

## 2017-11-26 NOTE — ED Provider Notes (Signed)
Ongoing care assigned to Dr. Jimmye Norman, patient currently being treated for COPD with cough and congestion.  She is currently stable, given 1 dose of Lasix here due to some pedal edema.  End of care is that she is presently awaiting discharge, patient has requested to go home by ambulance that she is not amatory after her fall and lower back injury.  Not ambulatory due to a thoracic compression fracture.  She has been in rehabilitation for this.  Currently awaiting discharge back to home   Delman Kitten, MD 11/26/17 [phone]

## 2017-11-26 NOTE — Discharge Instructions (Signed)
Please continue current medications prescribed and follow up today with Dr. Meredith Staggers (who will see you at your home this morning.)  If you develop any new or worsening symptoms, including but not limited to fever, persistent vomiting, worsening shortness of breath, or other symptoms that concern you, please return to the Emergency Department immediately.

## 2017-11-26 NOTE — ED Notes (Signed)
Will recheck pt's troponin when pt returned from ct scan.

## 2017-11-26 NOTE — ED Notes (Signed)
Patient transported to CT 

## 2017-11-26 NOTE — ED Notes (Signed)
Report to rebecca, rn.  

## 2017-11-26 NOTE — ED Notes (Signed)
Pt cleansed of urine and stool. Pt to ct scan after cleansing of urine and stool.

## 2017-11-28 ENCOUNTER — Ambulatory Visit
Admission: RE | Admit: 2017-11-28 | Discharge: 2017-11-28 | Disposition: A | Discharge status: Home / Self Care | Payer: Medicare (Managed Care) | Source: Ambulatory Visit | Attending: Family Medicine | Admitting: Family Medicine

## 2017-11-28 DIAGNOSIS — X58XXXD Exposure to other specified factors, subsequent encounter: Secondary | ICD-10-CM | POA: Insufficient documentation

## 2017-11-28 DIAGNOSIS — J9811 Atelectasis: Secondary | ICD-10-CM | POA: Insufficient documentation

## 2017-11-28 DIAGNOSIS — S22009G Unspecified fracture of unspecified thoracic vertebra, subsequent encounter for fracture with delayed healing: Secondary | ICD-10-CM

## 2017-11-28 DIAGNOSIS — J90 Pleural effusion, not elsewhere classified: Secondary | ICD-10-CM | POA: Insufficient documentation

## 2017-12-14 ENCOUNTER — Emergency Department: Payer: Medicare Other

## 2017-12-14 ENCOUNTER — Other Ambulatory Visit: Payer: Self-pay

## 2017-12-14 ENCOUNTER — Emergency Department
Admission: EM | Admit: 2017-12-14 | Discharge: 2017-12-15 | Disposition: A | Discharge status: Home / Self Care | Payer: Medicare Other | Attending: Emergency Medicine | Admitting: Emergency Medicine

## 2017-12-14 DIAGNOSIS — J441 Chronic obstructive pulmonary disease with (acute) exacerbation: Secondary | ICD-10-CM

## 2017-12-14 DIAGNOSIS — R05 Cough: Secondary | ICD-10-CM | POA: Insufficient documentation

## 2017-12-14 DIAGNOSIS — Z79899 Other long term (current) drug therapy: Secondary | ICD-10-CM | POA: Diagnosis not present

## 2017-12-14 DIAGNOSIS — N189 Chronic kidney disease, unspecified: Secondary | ICD-10-CM | POA: Insufficient documentation

## 2017-12-14 DIAGNOSIS — J449 Chronic obstructive pulmonary disease, unspecified: Secondary | ICD-10-CM | POA: Diagnosis not present

## 2017-12-14 DIAGNOSIS — I129 Hypertensive chronic kidney disease with stage 1 through stage 4 chronic kidney disease, or unspecified chronic kidney disease: Secondary | ICD-10-CM | POA: Diagnosis not present

## 2017-12-14 DIAGNOSIS — R0602 Shortness of breath: Secondary | ICD-10-CM | POA: Insufficient documentation

## 2017-12-14 DIAGNOSIS — Z87891 Personal history of nicotine dependence: Secondary | ICD-10-CM | POA: Insufficient documentation

## 2017-12-14 DIAGNOSIS — E1122 Type 2 diabetes mellitus with diabetic chronic kidney disease: Secondary | ICD-10-CM | POA: Diagnosis not present

## 2017-12-14 DIAGNOSIS — R059 Cough, unspecified: Secondary | ICD-10-CM

## 2017-12-14 LAB — COMPREHENSIVE METABOLIC PANEL
ALT: 12 U/L — AB (ref 14–54)
AST: 17 U/L (ref 15–41)
Albumin: 3.7 g/dL (ref 3.5–5.0)
Alkaline Phosphatase: 70 U/L (ref 38–126)
Anion gap: 7 (ref 5–15)
BILIRUBIN TOTAL: 0.5 mg/dL (ref 0.3–1.2)
BUN: 17 mg/dL (ref 6–20)
CALCIUM: 9.5 mg/dL (ref 8.9–10.3)
CO2: 34 mmol/L — ABNORMAL HIGH (ref 22–32)
CREATININE: 0.74 mg/dL (ref 0.44–1.00)
Chloride: 99 mmol/L — ABNORMAL LOW (ref 101–111)
GFR calc non Af Amer: >60 mL/min (ref >60)
Glucose, Bld: 111 mg/dL — ABNORMAL HIGH (ref 65–99)
Potassium: 3.9 mmol/L (ref 3.5–5.1)
Sodium: 140 mmol/L (ref 135–145)
TOTAL PROTEIN: 6.6 g/dL (ref 6.5–8.1)

## 2017-12-14 LAB — CBC
HEMATOCRIT: 35.9 % (ref 35.0–47.0)
Hemoglobin: 11.7 g/dL — ABNORMAL LOW (ref 12.0–16.0)
MCH: 30.4 pg (ref 26.0–34.0)
MCHC: 32.7 g/dL (ref 32.0–36.0)
MCV: 93 fL (ref 80.0–100.0)
Platelets: 227 10*3/uL (ref 150–440)
RBC: 3.86 MIL/uL (ref 3.80–5.20)
RDW: 16.3 % — ABNORMAL HIGH (ref 11.5–14.5)
WBC: 7.9 10*3/uL (ref 3.6–11.0)

## 2017-12-14 LAB — TROPONIN I: TROPONIN I: 0.04 ng/mL — AB (ref <0.03)

## 2017-12-14 MED ORDER — IPRATROPIUM-ALBUTEROL 0.5-2.5 (3) MG/3ML IN SOLN
3.0000 mL | Freq: Once | RESPIRATORY_TRACT | Status: AC
  Administered 2017-12-14: 3 mL via RESPIRATORY_TRACT
  Filled 2017-12-14: qty 3

## 2017-12-14 MED ORDER — METHYLPREDNISOLONE SODIUM SUCC 125 MG IJ SOLR
125.0000 mg | Freq: Once | INTRAMUSCULAR | Status: AC
  Administered 2017-12-14: 125 mg via INTRAVENOUS
  Filled 2017-12-14: qty 2

## 2017-12-14 MED ORDER — PREDNISONE 20 MG PO TABS: 40.0000 mg | ORAL_TABLET | Freq: Every day | ORAL | 0 refills | Status: DC

## 2017-12-14 NOTE — ED Provider Notes (Signed)
[name] Emergency Department Provider Note  Time seen: 9:43 PM  I have reviewed the triage vital signs and the nursing notes.   HISTORY  Chief Complaint Respiratory Distress    HPI Sara Pierce is a 77 y.o. female with a past medical history of cardiomyopathy, CKD, COPD, depression, diabetes, hypertension, presents to the emergency department for shortness of breath cough and congestion.  According to the patient for the past several days she has been coughing feeling congested and today was feeling short of breath so she called EMS for transport to the emergency department for evaluation.  EMS stated initially the patient had a saturation in the mid to upper 80s on her normal 3 L of oxygen.  Upon arrival to the emergency department after 1 DuoNeb patient had a room air saturation 93%, currently 95% on 3 L which is her normal oxygen that she wears 24/7.  Patient denies any chest pain.  Denies any leg pain or swelling.  Is not sure if she has had a fever.  States mild congestion.  Otherwise largely negative review of systems.  Past Medical History:  Diagnosis Date  . Arthritis   . Cardiomyopathy ([place])   . Cataract   . Cerebral hemorrhage ([place])   . Chronic kidney disease   . Clotting disorder ([place])   . COPD (chronic obstructive pulmonary disease) ([place])   . Depression   . Diabetes mellitus without complication ([place])   . Hypertension   . Mixed incontinence   . Obesity   . Sleep apnea   . Thyroid disease   . Urinary frequency   . Vaginal atrophy   . Varicose veins     Patient Active Problem List   Diagnosis Date Noted  . Pressure injury of skin 10/25/2017  . COPD with exacerbation ([place]) 10/24/2017  . HCAP (healthcare-associated pneumonia) 08/19/2017  . CAP (community acquired pneumonia) 10/03/2016  . Sepsis ([place]) 11/15/2015  . Absolute anemia 03/15/2015  . Anxiety 03/15/2015  . Arthritis 03/15/2015  . Cardiomyopathy, secondary ([place]) 03/15/2015   . Cataract 03/15/2015  . Brain bleed ([place]) 03/15/2015  . Chronic constipation 03/15/2015  . Chronic kidney failure 03/15/2015  . Blood clotting disorder ([place]) 03/15/2015  . CAFL (chronic airflow limitation) ([place]) 03/15/2015  . Clinical depression 03/15/2015  . Diabetes ([place]) 03/15/2015  . H/O varicella 03/15/2015  . BP (high blood pressure) 03/15/2015  . Mixed incontinence 03/15/2015  . 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 Adiposity 03/15/2015  . Apnea, sleep 03/15/2015  . Disease of thyroid gland 03/15/2015  . FOM (frequency of micturition) 03/15/2015  . 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 Candida vaginitis 03/15/2015  . Abnormal ECG 03/15/2015    Past Surgical History:  Procedure Laterality Date  . APPENDECTOMY    . Cardiac Catherization    . COLONOSCOPY WITH PROPOFOL N/A 04/28/2016   Procedure: COLONOSCOPY WITH PROPOFOL;  Surgeon: Manya Silvas, MD;  Location: [facility] ENDOSCOPY;  Service: Endoscopy;  Laterality: N/A;  . COLONOSCOPY WITH PROPOFOL N/A 04/29/2016   Procedure: COLONOSCOPY WITH PROPOFOL;  Surgeon: Manya Silvas, MD;  Location: [facility] ENDOSCOPY;  Service: Endoscopy;  Laterality: N/A;  . JOINT REPLACEMENT    . STRABISMUS SURGERY    . TONSILLECTOMY    . TUBAL LIGATION      Prior to Admission medications   Medication Sig Start Date End Date Taking? Authorizing Provider  acetaminophen (TYLENOL) 650 MG CR tablet Take 650 mg every 6 (six) hours  by mouth. scheduled    [provider]  albuterol (PROVENTIL HFA;VENTOLIN HFA) 108 (90 Base) MCG/ACT inhaler Inhale 2 puffs into the lungs every 6 (six) hours as needed for wheezing or shortness of breath. Patient not taking: Reported on 10/24/2017 06/15/17   Rudene Re, MD  albuterol (PROVENTIL) (2.5 MG/3ML) 0.083% nebulizer solution Take 2.5 mg by nebulization every 4 (four) hours as needed for wheezing or shortness of breath.    [provider]  alum & mag  hydroxide-simeth (MAALOX/MYLANTA) 200-200-20 MG/5ML suspension Take 15 mLs by mouth every 6 (six) hours as needed for indigestion or heartburn.    [provider]  budesonide (PULMICORT) 0.5 MG/2ML nebulizer solution Take 2 mLs (0.5 mg total) 2 (two) times daily by nebulization. 10/28/17   Demetrios Loll, MD  buPROPion (WELLBUTRIN XL) 150 MG 24 hr tablet Take 150 mg by mouth daily.    [provider]  cholecalciferol (VITAMIN D) 1000 units tablet Take 1,000 Units daily by mouth.    [provider]  cyclobenzaprine (FLEXERIL) 5 MG tablet Take 5 mg by mouth 2 (two) times daily as needed for muscle spasms.    [provider]  diazepam (VALIUM) 5 MG tablet Take 5 mg every 6 (six) hours as needed by mouth for anxiety.    [provider]  diphenhydrAMINE (BENADRYL) 25 MG tablet Take 25 mg by mouth daily as needed (burning/ flushing).    [provider]  docusate sodium (COLACE) 100 MG capsule Take 1 capsule (100 mg total) by mouth 2 (two) times daily as needed for mild constipation. Patient not taking: Reported on 09/11/2017 10/04/16   Nicholes Mango, MD  enoxaparin (LOVENOX) 40 MG/0.4ML injection Inject 40 mg daily into the skin.    [provider]  ferrous sulfate 325 (65 FE) MG tablet Take 1 tablet (325 mg total) 2 (two) times daily with a meal by mouth. 10/28/17   Demetrios Loll, MD  FLUoxetine (PROZAC) 40 MG capsule Take 40 mg by mouth daily.    [provider]  fluticasone (FLONASE) 50 MCG/ACT nasal spray Place 2 sprays into both nostrils daily.     [provider]  gabapentin (NEURONTIN) 300 MG capsule Take 300 mg at bedtime by mouth.    [provider]  guaiFENesin-dextromethorphan (ROBITUSSIN DM) 100-10 MG/5ML syrup Take 10 mLs by mouth every 6 (six) hours as needed for cough. Patient not taking: Reported on 10/24/2017 10/04/16   Nicholes Mango, MD  hydrOXYzine (ATARAX/VISTARIL) 25 MG tablet Take 25 mg by mouth 2 (two)  times daily as needed for anxiety.    [provider]  ipratropium-albuterol (DUONEB) 0.5-2.5 (3) MG/3ML SOLN Inhale 3 mLs into the lungs every 6 (six) hours as needed (wheezing).     [provider]  liothyronine (CYTOMEL) 25 MCG tablet Take 25 mcg by mouth daily.     [provider]  liver oil-zinc oxide (DESITIN) 40 % ointment Apply 1 application daily topically. After bath    [provider]  magnesium oxide (MAG-OX) 400 MG tablet Take 400 mg by mouth daily.     [provider]  methocarbamol (ROBAXIN) 750 MG tablet Take 1,500 mg every 6 (six) hours by mouth.    [provider]  montelukast (SINGULAIR) 10 MG tablet Take 10 mg by mouth at bedtime.     [provider]  naloxone [facility]) nasal spray 4 mg/0.1 mL Place 1 spray into the nose. Repeat every 3 minutes if no response.  [provider]  nystatin cream (MYCOSTATIN) Apply 1 application topically 2 (two) times daily.    [provider]  Oxycodone HCl 10 MG TABS Take 10 mg every 6 (six) hours as needed by mouth for moderate pain, severe pain or breakthrough pain.     [provider]  polyethylene glycol (MIRALAX / GLYCOLAX) packet Take 17 g by mouth daily.    [provider]  potassium chloride (K-DUR) 10 MEQ tablet Take 10 mEq by mouth daily.     [provider]  predniSONE (DELTASONE) 10 MG tablet 30 mg po daily 1 day, 20 mg po daily 1 day, 10 mg po daily 1 day. Patient not taking: Reported on 11/25/2017 10/28/17   Demetrios Loll, MD  pregabalin (LYRICA) 50 MG capsule Take 50 mg by mouth daily.    [provider]  primidone (MYSOLINE) 50 MG tablet Take 50 mg by mouth 2 (two) times daily.     [provider]  senna-docusate (SENOKOT-S) 8.6-50 MG tablet Take 2 tablets by mouth daily.    [provider]  simvastatin (ZOCOR) 40 MG tablet Take 40 mg by mouth at bedtime.     [provider]  sodium chloride  (OCEAN) 0.65 % SOLN nasal spray Place 2 sprays into both nostrils 3 (three) times daily as needed for congestion. Patient not taking: Reported on 10/24/2017 10/04/16   Nicholes Mango, MD  tiZANidine (ZANAFLEX) 4 MG tablet Take 4 mg by mouth every 12 (twelve) hours as needed for muscle spasms.    [provider]  traMADol (ULTRAM) 50 MG tablet Take 50 mg by mouth every 6 (six) hours as needed for moderate pain.    [provider]  umeclidinium-vilanterol (ANORO ELLIPTA) 62.5-25 MCG/INH AEPB Inhale 1 puff into the lungs daily.    [provider]  vancomycin 1,250 mg in sodium chloride 0.9 % 250 mL Inject 1,250 mg daily into the vein. Patient not taking: Reported on 11/25/2017 10/28/17   Demetrios Loll, MD  Vitamin D, Ergocalciferol, (DRISDOL) 50000 units CAPS capsule Take 50,000 Units every 7 (seven) days by mouth.    [provider]    Allergies  Allergen Reactions  . Aspirin Swelling  . Codeine Itching  . Naprosyn [Naproxen] Other (See Comments)    Per MAR pt allergic  . Other     Elastic Bandages/ Supports Per MAR  . Tape Hives  . Valacyclovir     Family History  Problem Relation Age of Onset  . Heart attack Father   . Coronary artery disease Father   . Hypertension Father   . Hyperlipidemia Father   . Heart attack Mother   . Hypertension Mother   . Hyperlipidemia Mother   . Breast cancer Neg Hx     Social History Social History   Tobacco Use  . Smoking status: Former Smoker    Packs/day: 0.50    Years: 44.00    Pack years: 22.00    Types: Cigarettes  . Smokeless tobacco: Never Used  Substance Use Topics  . Alcohol use: No  . Drug use: No    Review of Systems Constitutional: No known fever. Eyes: Negative for visual complaints. ENT: Mild congestion Cardiovascular: Negative for chest pain. Respiratory: Positive for cough, shortness of breath today.  Occasional sputum production Gastrointestinal: Negative for abdominal pain, vomiting   Genitourinary: Negative for dysuria. Musculoskeletal: Negative for leg pain or swelling Skin: Negative for rash. Neurological: Negative for headache All other ROS negative  ____________________________________________  PHYSICAL EXAM:  VITAL SIGNS: ED Triage Vitals  Enc Vitals Group     BP 12/14/17 2126 112/76     Pulse Rate 12/14/17 2126 90     Resp 12/14/17 2126 17     Temp 12/14/17 2126 98.3 F (36.8 C)     Temp Source 12/14/17 2126 Oral     SpO2 12/14/17 2125 92 %     Weight 12/14/17 2130 243 lb (110.2 kg)     Height 12/14/17 2130 5\' 5"  (1.651 m)     Head Circumference --      Peak Flow --      Pain Score 12/14/17 2125 8     Pain Loc --      Pain Edu? --      Excl. in [place]? --    Constitutional: Alert and oriented. Well appearing and in no distress. Eyes: Normal exam ENT   Head: Normocephalic and atraumatic   Mouth/Throat: Mucous membranes are moist. Cardiovascular: Normal rate, regular rhythm.  Respiratory: Normal respiratory effort without tachypnea nor retractions.  Slight expiratory wheeze bilaterally.  No rales or rhonchi. Gastrointestinal: Soft and nontender. No distention.  Musculoskeletal: Nontender with normal range of motion in all extremities. No lower extremity tenderness or edema. Neurologic:  Normal speech and language. No gross focal neurologic deficits Skin:  Skin is warm, dry and intact.  Psychiatric: Mood and affect are normal.   ____________________________________________    EKG  EKG reviewed and interpreted by myself shows normal sinus rhythm at 91 bpm with a narrow QRS, normal axis, largely normal intervals with nonspecific but no concerning ST changes.  No ST elevation.  Electrical interference.  ____________________________________________    RADIOLOGY  Shoulder x-ray shows no acute abnormality. Chest x-ray shows no acute abnormality  ____________________________________________   INITIAL IMPRESSION / ASSESSMENT AND PLAN /  ED COURSE  Pertinent labs & imaging results that were available during my care of the patient were reviewed by me and considered in my medical decision making (see chart for details).  Patient presents emergency department for shortness of breath cough and congestion.  Currently the patient appears well 95% on 3 L of oxygen during my evaluation.  No chest pain.  No leg pain or swelling.  Differential would include pneumonia, COPD exacerbation, pneumothorax, ACS.  We will check labs, x-ray, treat with a DuoNeb and Solu-Medrol in the emergency department.  Overall the patient appears well.  Once workup is completed we will discuss options.  [facility] has [facility].  I reviewed the patient's records including most recent discharge summary 10/28/17 in which she was admitted for respiratory distress and likely COPD exacerbation.  Patient has been seen in the emergency department approximately 2 weeks ago for respiratory distress was ultimately discharged home.  X-rays are negative.  Labs are largely at baseline.  Normal white blood cell count.  Troponin slightly elevated 0.04.  Reviewing the patient's lab she will occasionally have slight troponin elevation similar to today's value.  Given the patient's improvement with breathing treatments.  Patient was dose Solu-Medrol in the emergency department I believe the patient is likely safe for discharge home with a course of prednisone and close PCP follow-up.  I discussed this with the patient who is agreeable to this plan of care.  ____________________________________________   FINAL CLINICAL IMPRESSION(S) / ED DIAGNOSES  Shortness of breath Cough    Harvest Dark, MD 12/14/17 2310

## 2017-12-14 NOTE — Discharge Instructions (Signed)
Please take your steroids as prescribed.  Please follow-up with your doctor, call tomorrow for the next available appointment.  Return to the emergency department for any worsening shortness of breath, chest pain, or any other symptom personally concerning to yourself.

## 2017-12-14 NOTE — ED Triage Notes (Signed)
Pt arrived via EMS from home d/t difficulty breathing. Pt has hx of CHF and COPD. Pt wears 3L of O2 at home and EMS reports pt sat was at 86% upon arrival. Pt was given one duoneb prior to arriving to ED via EMS. Pt is currently on 3L with 93% O2. Pt is A&O x4 and c/o left arm pain. Pt reports she fell back in October and broke her left arm.

## 2017-12-25 ENCOUNTER — Emergency Department
Admission: EM | Admit: 2017-12-25 | Discharge: 2017-12-26 | Disposition: A | Discharge status: Home / Self Care | Payer: Medicare Other | Attending: Emergency Medicine | Admitting: Emergency Medicine

## 2017-12-25 ENCOUNTER — Emergency Department: Payer: Medicare Other

## 2017-12-25 ENCOUNTER — Encounter: Payer: Self-pay | Admitting: Emergency Medicine

## 2017-12-25 ENCOUNTER — Other Ambulatory Visit: Payer: Self-pay

## 2017-12-25 DIAGNOSIS — M6281 Muscle weakness (generalized): Secondary | ICD-10-CM | POA: Diagnosis present

## 2017-12-25 DIAGNOSIS — G8929 Other chronic pain: Secondary | ICD-10-CM | POA: Diagnosis not present

## 2017-12-25 DIAGNOSIS — J449 Chronic obstructive pulmonary disease, unspecified: Secondary | ICD-10-CM | POA: Diagnosis not present

## 2017-12-25 DIAGNOSIS — Z87891 Personal history of nicotine dependence: Secondary | ICD-10-CM | POA: Diagnosis not present

## 2017-12-25 DIAGNOSIS — I130 Hypertensive heart and chronic kidney disease with heart failure and stage 1 through stage 4 chronic kidney disease, or unspecified chronic kidney disease: Secondary | ICD-10-CM | POA: Diagnosis not present

## 2017-12-25 DIAGNOSIS — I509 Heart failure, unspecified: Secondary | ICD-10-CM | POA: Diagnosis not present

## 2017-12-25 DIAGNOSIS — F329 Major depressive disorder, single episode, unspecified: Secondary | ICD-10-CM | POA: Diagnosis not present

## 2017-12-25 DIAGNOSIS — Z79899 Other long term (current) drug therapy: Secondary | ICD-10-CM | POA: Diagnosis not present

## 2017-12-25 DIAGNOSIS — N189 Chronic kidney disease, unspecified: Secondary | ICD-10-CM | POA: Diagnosis not present

## 2017-12-25 DIAGNOSIS — E1122 Type 2 diabetes mellitus with diabetic chronic kidney disease: Secondary | ICD-10-CM | POA: Diagnosis not present

## 2017-12-25 HISTORY — DX: Heart failure, unspecified: I50.9

## 2017-12-25 LAB — CBC WITH DIFFERENTIAL/PLATELET
BASOS PCT: 0 %
Basophils Absolute: 0 10*3/uL (ref 0–0.1)
EOS ABS: 0.1 10*3/uL (ref 0–0.7)
EOS PCT: 1 %
HCT: 35.9 % (ref 35.0–47.0)
Hemoglobin: 12.1 g/dL (ref 12.0–16.0)
Lymphocytes Relative: 17 %
Lymphs Abs: 1.7 10*3/uL (ref 1.0–3.6)
MCH: 31.3 pg (ref 26.0–34.0)
MCHC: 33.5 g/dL (ref 32.0–36.0)
MCV: 93.2 fL (ref 80.0–100.0)
MONO ABS: 0.6 10*3/uL (ref 0.2–0.9)
Monocytes Relative: 6 %
Neutro Abs: 7.9 10*3/uL — ABNORMAL HIGH (ref 1.4–6.5)
Neutrophils Relative %: 76 %
PLATELETS: 272 10*3/uL (ref 150–440)
RBC: 3.85 MIL/uL (ref 3.80–5.20)
RDW: 15.7 % — AB (ref 11.5–14.5)
WBC: 10.4 10*3/uL (ref 3.6–11.0)

## 2017-12-25 LAB — URINALYSIS, COMPLETE (UACMP) WITH MICROSCOPIC
Bilirubin Urine: NEGATIVE
GLUCOSE, UA: NEGATIVE mg/dL
KETONES UR: NEGATIVE mg/dL
Leukocytes, UA: NEGATIVE
NITRITE: NEGATIVE
PH: 5 (ref 5.0–8.0)
PROTEIN: NEGATIVE mg/dL
Specific Gravity, Urine: 1.019 (ref 1.005–1.030)

## 2017-12-25 LAB — COMPREHENSIVE METABOLIC PANEL
ALBUMIN: 3.8 g/dL (ref 3.5–5.0)
ALT: 8 U/L — ABNORMAL LOW (ref 14–54)
AST: 18 U/L (ref 15–41)
Alkaline Phosphatase: 75 U/L (ref 38–126)
Anion gap: 9 (ref 5–15)
BUN: 16 mg/dL (ref 6–20)
CO2: 34 mmol/L — AB (ref 22–32)
Calcium: 9.6 mg/dL (ref 8.9–10.3)
Chloride: 93 mmol/L — ABNORMAL LOW (ref 101–111)
Creatinine, Ser: 0.75 mg/dL (ref 0.44–1.00)
GFR calc non Af Amer: >60 mL/min (ref >60)
GLUCOSE: 108 mg/dL — AB (ref 65–99)
POTASSIUM: 4.2 mmol/L (ref 3.5–5.1)
SODIUM: 136 mmol/L (ref 135–145)
Total Bilirubin: 0.9 mg/dL (ref 0.3–1.2)
Total Protein: 7.2 g/dL (ref 6.5–8.1)

## 2017-12-25 LAB — BRAIN NATRIURETIC PEPTIDE: B Natriuretic Peptide: 84 pg/mL (ref 0.0–100.0)

## 2017-12-25 LAB — TROPONIN I

## 2017-12-25 MED ORDER — FLUOXETINE HCL 20 MG PO CAPS
40.0000 mg | ORAL_CAPSULE | Freq: Every day | ORAL | Status: DC
  Administered 2017-12-25 – 2017-12-26 (×2): 40 mg via ORAL
  Filled 2017-12-25 (×2): qty 2

## 2017-12-25 MED ORDER — FERROUS SULFATE 325 (65 FE) MG PO TABS
325.0000 mg | ORAL_TABLET | Freq: Two times a day (BID) | ORAL | Status: DC
  Administered 2017-12-25 – 2017-12-26 (×2): 325 mg via ORAL
  Filled 2017-12-25 (×3): qty 1

## 2017-12-25 MED ORDER — TIZANIDINE HCL 4 MG PO TABS
4.0000 mg | ORAL_TABLET | Freq: Two times a day (BID) | ORAL | Status: DC | PRN
  Administered 2017-12-25: 4 mg via ORAL
  Filled 2017-12-25: qty 1

## 2017-12-25 MED ORDER — IPRATROPIUM-ALBUTEROL 0.5-2.5 (3) MG/3ML IN SOLN
3.0000 mL | Freq: Once | RESPIRATORY_TRACT | Status: AC
  Administered 2017-12-25: 3 mL via RESPIRATORY_TRACT
  Filled 2017-12-25: qty 3

## 2017-12-25 MED ORDER — OXYCODONE HCL 5 MG PO TABS
10.0000 mg | ORAL_TABLET | Freq: Four times a day (QID) | ORAL | Status: DC | PRN
  Administered 2017-12-26 (×2): 10 mg via ORAL
  Filled 2017-12-25 (×3): qty 2

## 2017-12-25 MED ORDER — FOSFOMYCIN TROMETHAMINE 3 G PO PACK
3.0000 g | PACK | Freq: Once | ORAL | Status: AC
  Administered 2017-12-25: 3 g via ORAL
  Filled 2017-12-25: qty 3

## 2017-12-25 MED ORDER — DIAZEPAM 5 MG PO TABS
5.0000 mg | ORAL_TABLET | Freq: Four times a day (QID) | ORAL | Status: DC | PRN
  Administered 2017-12-26 (×2): 5 mg via ORAL
  Filled 2017-12-25 (×2): qty 1

## 2017-12-25 MED ORDER — SIMVASTATIN 10 MG PO TABS
40.0000 mg | ORAL_TABLET | Freq: Every day | ORAL | Status: DC
  Administered 2017-12-25: 40 mg via ORAL
  Filled 2017-12-25: qty 4

## 2017-12-25 MED ORDER — POTASSIUM CHLORIDE ER 10 MEQ PO TBCR
10.0000 meq | EXTENDED_RELEASE_TABLET | Freq: Every day | ORAL | Status: DC
  Administered 2017-12-26: 10 meq via ORAL
  Filled 2017-12-25: qty 1

## 2017-12-25 MED ORDER — OXYCODONE HCL 5 MG PO TABS
10.0000 mg | ORAL_TABLET | Freq: Once | ORAL | Status: AC
  Administered 2017-12-25: 10 mg via ORAL
  Filled 2017-12-25: qty 2

## 2017-12-25 MED ORDER — DOCUSATE SODIUM 100 MG PO CAPS: 100.0000 mg | ORAL_CAPSULE | Freq: Two times a day (BID) | ORAL | Status: DC | PRN

## 2017-12-25 NOTE — ED Provider Notes (Signed)
-----------------------------------------   5:52 PM on 12/25/2017 -----------------------------------------  Social worker was unable to see today.  I discussed the patient with [facility], patient is no longer with [facility].  We will board in the emergency department overnight and have social work see in the morning for possible placement.  I discussed this with the patient she is agreeable.   Harvest Dark, MD 12/25/17 1754

## 2017-12-25 NOTE — ED Notes (Signed)
B/P cuffs were changed due to inaccurate readings. Unable to obtain an accurate blood pressure. Will let patient rest and try again. MD aware.
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
## 2017-12-25 NOTE — ED Notes (Signed)
Patient given phone and husband's number was dialed by this RN. Patient's husband had called 3 times.

## 2017-12-25 NOTE — ED Notes (Signed)
Pt provided graham crackers and peanut butter per MD Paduchowski.

## 2017-12-25 NOTE — ED Notes (Signed)
Recalled [facility]  [phone]

## 2017-12-25 NOTE — ED Notes (Signed)
Called [facility] left message to call ER   [phone]
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
## 2017-12-25 NOTE — ED Notes (Signed)
Tizanidine given for muscle spasms per Dr. Kerman Passey.

## 2017-12-26 DIAGNOSIS — G8929 Other chronic pain: Secondary | ICD-10-CM | POA: Diagnosis not present

## 2017-12-26 NOTE — ED Notes (Signed)
Pt sleeping in room at this time. Pt in NAD.

## 2017-12-26 NOTE — ED Notes (Signed)
Changed pt external cath

## 2017-12-26 NOTE — ED Notes (Signed)
Pt cleaned with large amount of firm stool. Pt states that she feels better now an d can sleep.

## 2017-12-26 NOTE — ED Notes (Signed)
Pt called out for RN due to pt stating "I feel a ball of something in my butt". Pt noted to have large amount of hard stool. More barrier cream applied to pt at this time.

## 2017-12-26 NOTE — ED Notes (Signed)
Pt awakened for breakfast. Meal set up. Pt a/o. No c/o at this time. P.T. at bedside.

## 2017-12-26 NOTE — Evaluation (Signed)
Physical Therapy Evaluation Patient Details Name: Sara Pierce MRN: [id number] DOB: 1941-10-30 Today's Date: 12/26/2017   History of Present Illness  Pt is a 77 y.o. female presenting to hospital with generalized weakness and inability to care for self.  PMH includes non-op unstable T6 spinal fx (wears TLSO brace), h/o L elbow fx/sx (wears L UE splint); CHF, COPD, 3 L home O2, DM, htn, CKD, cerebral hemorrhage.    Clinical Impression  Prior to hospital admission, pt was recently hoyer dependent with transfers (pt reports she has not walked since sustaining thoracic spine fx and L UE fx August 31st 2018; pt reports she went to rehab but left against recommendations and [facility] stopped following her the same day she left rehab).  Pt gets TLSO brace placed on prior to getting OOB with hoyer lift but does not use TLSO brace when logrolling in hospital bed to use bed pan at home (pt reports she uses bed pan for all toileting needs).  Of note, pt does not have TLSO brace with her currently in ED (nursing notified).  Pt sits in manual w/c when OOB but requires husbands assist to push her (pt also reports w/c does not fit well through her doors at home).  Pt also wears L UE splint all the time to protect L UE.  Overall pt demonstrating L LE weakness compared to R LE.  Decreased L knee extension and decreased B DF noted (pt appearing with ankle contractures into plantarflexion).  Pt lives with her husband but reports her husband does not get her OOB every day (pt reports her husband is able to use hoyer lift on his own when he does) and is concerned regarding bed sores (pt also concerned regarding her husbands drinking; SW notified).  Currently pt required maximum assist to logroll partially onto L side.  Mobility limited d/t c/o significant 9/10 B LE pain (pt reports chronic LE pain but does not have it when she has proper pain medication); nursing notified.  Pt denied any back pain during session and reported minimal  chronic L UE pain.  Pt would benefit from skilled PT to address noted impairments and functional limitations (see below for any additional details).  Pt requesting STR but pt appears close to her recent functional baseline.  Upon hospital discharge, recommend pt discharge to home with HHPT and 24/7 assist; anticipate pt requires increased assist levels at home or may be a candidate for long term care placement.    Follow Up Recommendations Home health PT;Supervision/Assistance - 24 hour    Equipment Recommendations  3in1 (PT);Wheelchair (measurements PT);Hospital bed;Other (comment)(Hoyer lift)    Recommendations for Other Services       Precautions / Restrictions Precautions Precautions: Fall;Back Precaution Comments: unstable T6 spinal fx Required Braces or Orthoses: Spinal Brace;Other Brace/Splint Spinal Brace: Thoracolumbosacral orthotic;Applied in supine position Other Brace/Splint: L UE splint (pt wears all the time except when bathing/hygiene) Restrictions Weight Bearing Restrictions: Yes Other Position/Activity Restrictions: Pt reports she wears L UE splint and does not use L UE (pt did not specify [place] restrictions but does not appear to do much [place] through it)      Mobility  Bed Mobility Overal bed mobility: Needs Assistance Bed Mobility: Rolling Rolling: Max assist         General bed mobility comments: assist to position R LE and then to logroll partially onto L side; pt attempting to reach with R UE towards L side; deferred further mobility d/t pt's c/o significant LE  pain (pt reports chronic LE pain)  Transfers                 General transfer comment: Deferred (pt does not have TLSO here and has been hoyer dependent)  Ambulation/Gait             General Gait Details: Deferred (pt does not have TLSO here and has been hoyer dependent)  [employer] [place] (Stroke Patients Only)       Balance                                              Pertinent Vitals/Pain Pain Assessment: 0-10 Pain Score: 9  Pain Location: chronic B LE pain Pain Descriptors / Indicators: Aching;Sore;Grimacing Pain Intervention(s): Limited activity within patient's tolerance;Monitored during session;Repositioned;Patient requesting pain meds-RN notified    Home Living Family/patient expects to be discharged to:: Skilled nursing facility                 Additional Comments: Lives with her husband at home.  Has hospital bed, hoyer lift and manual w/c at home.    Prior Function Level of Independence: Needs assistance   Gait / Transfers Assistance Needed: Pt reports not walking since sustaining thoracic spine fx and L UE fx August 31st 2018; did go to rehab (was involved with [facility] until she left rehab).  Has been hoyer lift dependent since she left rehab (pt unable to state date she left rehab) but does report she can balance sitting up on her own but requires assist to get into that position.  Pt reports she requires assist for bed mobility (including logrolling in bed for using bed pan).  Husband pushes pt in w/c (pt reports w/c too big for doorways in home though).  ADL's / Homemaking Assistance Needed: Uses bed pan for toileting (reports she does not have a BSC and does not use toilet).  Comments: Pt reports her husband drinks and does not get her up OOB every day (husband able to use hoyer lift on his own).  [place] out of bed onto L arm about 3 weeks ago (pt reports it was imaged but no new injuries).  Puts TLSO brace on supine in bed (prior to getting out of bed).  Does not use TLSO brace when logrolling in bed for getting on/off bed pan.       Hand Dominance   Dominant Hand: Right    Extremity/Trunk Assessment   Upper Extremity Assessment Upper Extremity Assessment: LUE deficits/detail;RUE deficits/detail RUE Deficits / Details: shoulder flexion AROM to grossly 100 degrees; at  least 4/5 strength shoulder flexion, elbow flexion/extension; good hand grip LUE Deficits / Details: Deferred L UE d/t h/o fx and pt using L UE splint to protect it. LUE: Unable to fully assess due to immobilization    Lower Extremity Assessment Lower Extremity Assessment: RLE deficits/detail;LLE deficits/detail RLE Deficits / Details: Hip flexion PROM to grossly 60 degrees (limited d/t LE pain); at least 2/5 hip flexion; at least 3/5 knee extension and ankle DF; R knee almost to neutral extension; R ankle DF ROM impairment (R ankle in plantarflexed position/contracture) LLE Deficits / Details: Hip flexion PROM to grossly 60 degrees (limited d/t LE pain); L knee unable to straighten fully (painful and also appears to have  contraction into flexion); at least 2-/5 knee extension; at least 2+/5 ankle DF; L ankle DF ROM impairment (L ankle in plantarflexed position/contracture)       Communication   Communication: No difficulties  Cognition Arousal/Alertness: Awake/alert Behavior During Therapy: WFL for tasks assessed/performed Overall Cognitive Status: Within Functional Limits for tasks assessed                                        General Comments General comments (skin integrity, edema, etc.): Pt resting in bed eating breakfast upon PT arrival.  Nursing cleared pt for participation in physical therapy.  Pt agreeable to PT session.    Exercises     Assessment/Plan    PT Assessment Patient needs continued PT services  PT Problem List Decreased strength;Decreased range of motion;Decreased activity tolerance;Decreased balance;Decreased mobility;Pain;Decreased skin integrity       PT Treatment Interventions Functional mobility training;Therapeutic activities;Therapeutic exercise;Balance training;Patient/family education    PT Goals (Current goals can be found in the Care Plan section)  Acute Rehab PT Goals Patient Stated Goal: to go to rehab PT Goal Formulation: With  patient Time For Goal Achievement: 01/09/18 Potential to Achieve Goals: Fair    Frequency Min 2X/week   Barriers to discharge Decreased caregiver support      Co-evaluation               AM-PAC PT "6 Clicks" Daily Activity  Outcome Measure Difficulty turning over in bed (including adjusting bedclothes, sheets and blankets)?: Unable Difficulty moving from lying on back to sitting on the side of the bed? : Unable Difficulty sitting down on and standing up from a chair with arms (e.g., wheelchair, bedside commode, etc,.)?: Unable Help needed moving to and from a bed to chair (including a wheelchair)?: Total Help needed walking in hospital room?: Total Help needed climbing 3-5 steps with a railing? : Total 6 Click Score: 6    End of Session Equipment Utilized During Treatment: Oxygen Activity Tolerance: Patient limited by pain Patient left: in bed;with call bell/phone within reach Nurse Communication: Mobility status;Patient requests pain meds;Precautions;Weight bearing status PT Visit Diagnosis: Other abnormalities of gait and mobility (R26.89);Muscle weakness (generalized) (M62.81);History of falling (Z91.81);Pain Pain - part of body: Leg    Time: 2353-6144 PT Time Calculation (min) (ACUTE ONLY): 27 min   Charges:   PT Evaluation $PT Eval Low Complexity: 1 Low     PT G Codes:   PT G-Codes **NOT FOR INPATIENT CLASS** Functional Assessment Tool Used: AM-PAC 6 Clicks Basic Mobility Functional Limitation: Mobility: Walking and moving around Mobility: Walking and Moving Around Current Status (R1540): 100 percent impaired, limited or restricted Mobility: Walking and Moving Around Goal Status (G8676): At least 80 percent but less than 100 percent impaired, limited or restricted    Leitha Bleak, PT 12/26/17, 10:59 AM [phone]

## 2017-12-26 NOTE — ED Notes (Signed)
Pt called this RN due her legs hurting. Pt crying in bed with no visible tears seen by this RN. Pt offered her PRN valium for her leg pain. Pt stated that she would like to try the valium.

## 2017-12-26 NOTE — ED Notes (Signed)
Secretary notified to call ems for transport back home

## 2017-12-26 NOTE — ED Notes (Signed)
Pt called out for pain due to leg pain waking her up. Pt offered another PRN medication by this RN and pillow positioned under pt's leg per pt's request.

## 2017-12-26 NOTE — ED Notes (Signed)
Lunch tray given to pt with coke to drink

## 2017-12-26 NOTE — ED Notes (Signed)
Pt spouse has called x4 in the last 72minutes concerned about the pt coming home - he called the number left by SW for [facility] and they stated that they knew nothing about her - he is concerned about her coming home with no care in place and request that the SW call him - this nurse called the SW and she is going to call the spouse to discuss care plan ([phone])

## 2017-12-26 NOTE — Clinical Social Work Note (Signed)
CSW received a call from [employer] requesting CSW call pt's husband to address concerns about pt coming home and [facility] not being set up. CSW spoke husband-Tarminder Porto ([phone]) and explained transportation has already been arranged with EMS for ambulance transport and pt will be home within the next few hours. Husband expressed understanding. CSW also explained that [place] referral has been made to [facility], but they will not be out to the home today and they will not be in the home everyday/all day. Husband expressed understanding. CSW connected [facility] rep-Jason to husband to confirm referral was received. CSW called husband back to confirm pt had no additional questions, after speaking with Corene Cornea. Husband expressed understanding and thanked CSW for the assistance. CSW signing off as no further Social Work needs identified.    Oretha Ellis, Latanya Presser, [place] Social Worker-ED [phone]

## 2017-12-26 NOTE — Care Management Note (Signed)
Case Management Note  Patient Details  Name: CONSWELLA BRUNEY MRN: [id number] Date of Birth: 07/20/1941  Subjective/Objective:     Spoke to [facility] [facility] about getting home PT and nursing for the patient. She is going to Dr Marygrace Drought at [phone] as her PCP. The patient is agreeable as well as the husband whom I spoke to by phone with the patient's permission. Md is entering the face to face for these services now.  Action/Plan:   Expected Discharge Date:                  Expected Discharge Plan:     In-House Referral:     Discharge planning Services     Post Acute Care Choice:    Choice offered to:     DME Arranged:    DME Agency:     HH Arranged:    HH Agency:     Status of Service:     If discussed at [employer] of Stay Meetings, dates discussed:    Additional Comments:  Beau Fanny, RN 12/26/2017, 12:13 PM

## 2017-12-26 NOTE — ED Notes (Signed)
EMS  CALLED  TO  TRANSPORT  PT

## 2017-12-26 NOTE — ED Provider Notes (Signed)
-----------------------------------------   12:22 PM on 12/26/2017 -----------------------------------------   Blood pressure (!) 96/54, pulse 90, temperature 98.2 F (36.8 C), temperature source Oral, resp. rate (!) 25, height 5\' 5"  (1.651 m), weight 113.4 kg (250 lb), SpO2 97 %.  The patient had no acute events since last update.  Calm and cooperative at this time.  Patient eating lunch right now and has no new complaints.  Evaluated by physical therapy and social work.  Care manager also discussed the care plan with the husband and the plan at this time is to have the patient discharged to home where she will have home nursing, physical therapy as well as social work.  The patient and husband are amenable to this plan.  The patient has an appointment with her primary care doctor tomorrow.  She will be discharged at this time.   Orbie Pyo, MD 12/26/17 1224

## 2017-12-26 NOTE — Clinical Social Work Note (Signed)
CSW met with pt at bedside to address Social Work consult. Pt is from home with her husband. Pt stating she wants to return home with home health. P/T recommending HHPT. CSW was informed by RN and P/T therapist of report from pt that pt's husband is not taking care of her due to excessive drinking. CSW addressed concerns with pt and she stated "that's just how he is and it's not going to change." Pt stated that her husband has already set up an appointment with a new doctor, as pt does not have a PCP at this time. Pt also stated that pt's husband is getting his "sister-in-law from [place] to take care of her. CSW asked if the sister-in-law will be here this week and pt stated "No. It will take time to get her over here." CSW updated RNCM and EDP. CSW will make an Adult Protective Services report for possible pt follow up in the home. Pt aware this report will be made. CSW signing off as no further Social Work needs identified.   Oretha Ellis, Latanya Presser, [place] Social Worker-ED [phone]

## 2017-12-26 NOTE — ED Notes (Signed)
SW called back after speaking to pt spouse and he now has the resources that he needs to feel comfortable letting pt come home

## 2017-12-27 LAB — URINE CULTURE: Culture: NO GROWTH

## 2018-01-03 IMAGING — US US CAROTID DUPLEX BILAT
1 series · 13 of 24 positions shown · non-contrast
Comparison: None.

CLINICAL DATA: Syncope

EXAM:
BILATERAL CAROTID DUPLEX ULTRASOUND
TECHNIQUE: Gray scale imaging, color Doppler and duplex ultrasound were
performed of bilateral carotid and vertebral arteries in the neck.

[Series 1: us carotid duplex bilat · 13 of 66 slices shown]
[im 1/66]
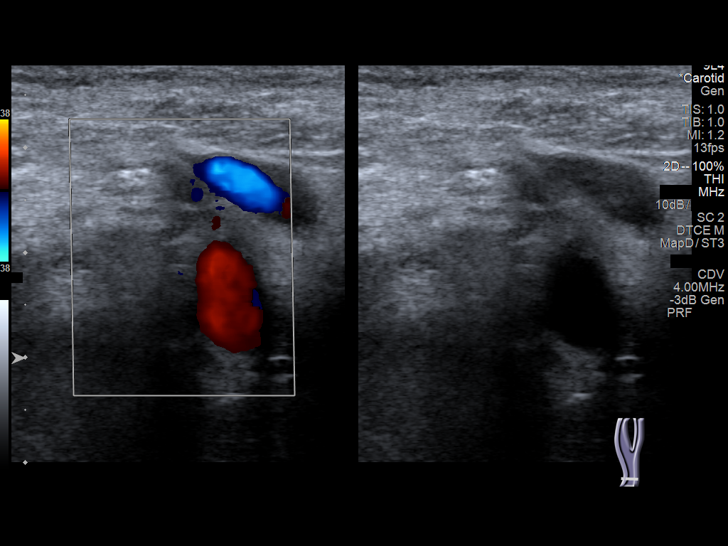
[im 6/66]
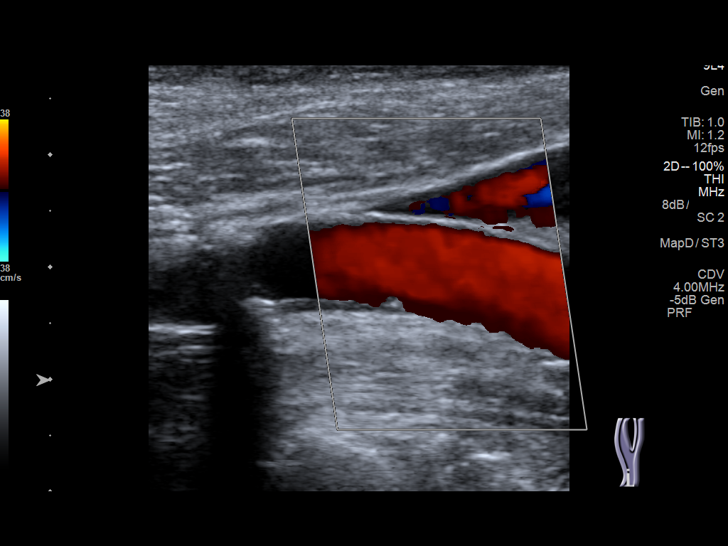
[im 12/66]
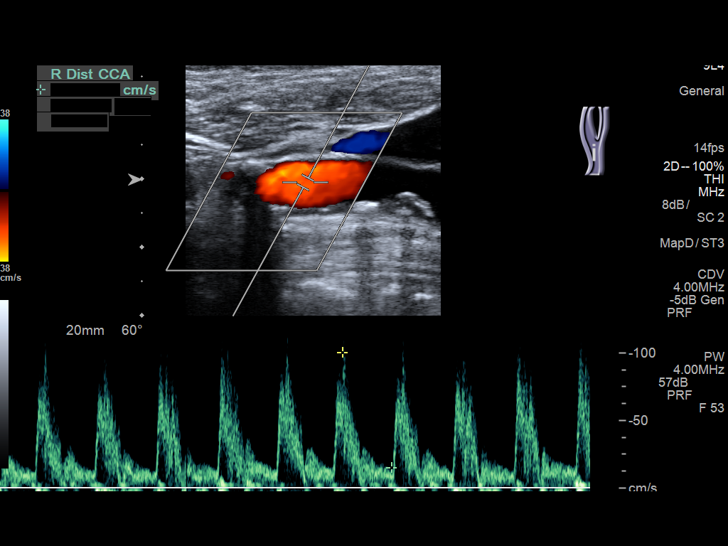
[im 17/66]
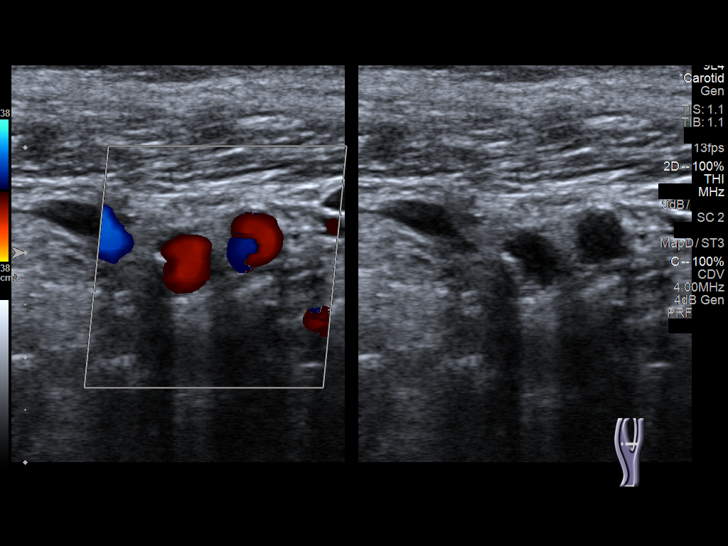
[im 23/66]
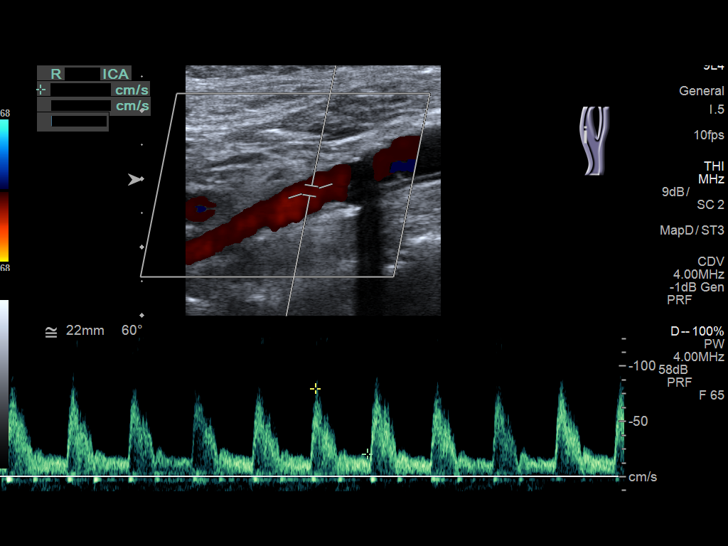
[im 29/66]
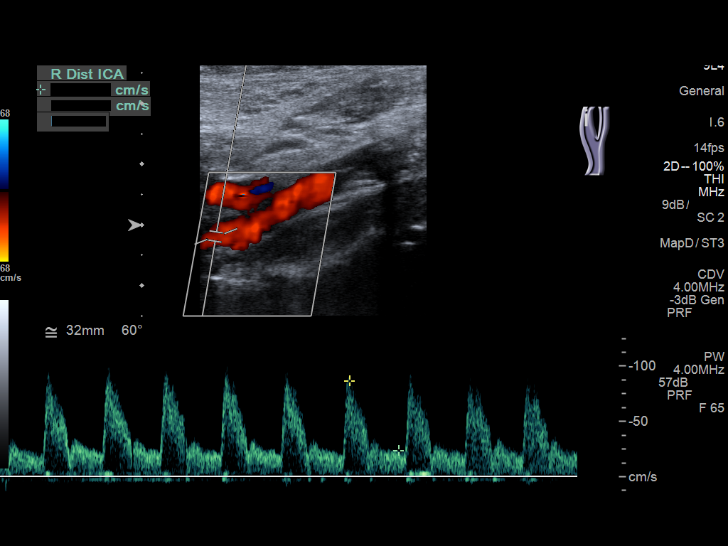
[im 34/66]
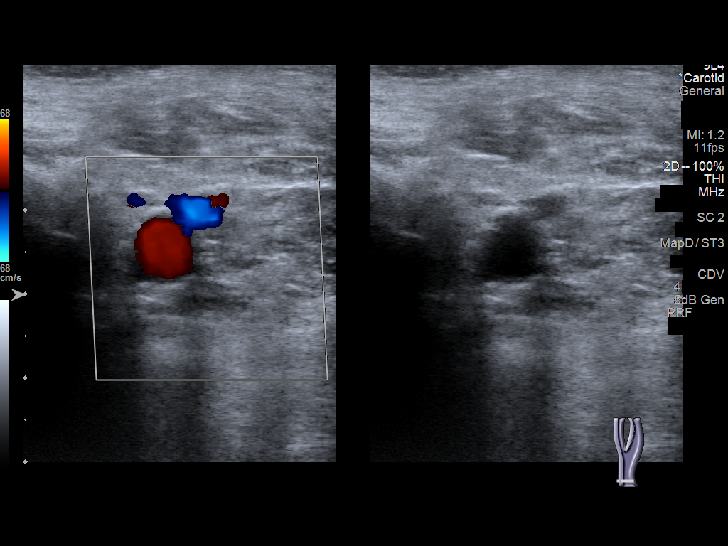
[im 37/66]
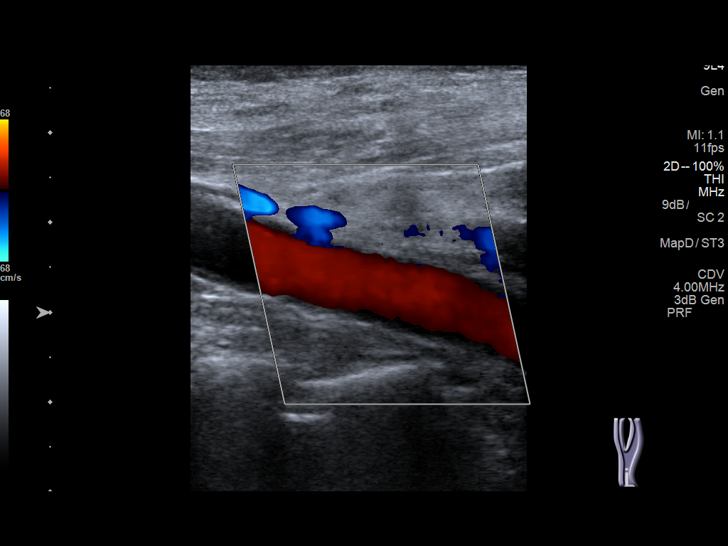
[im 43/66]
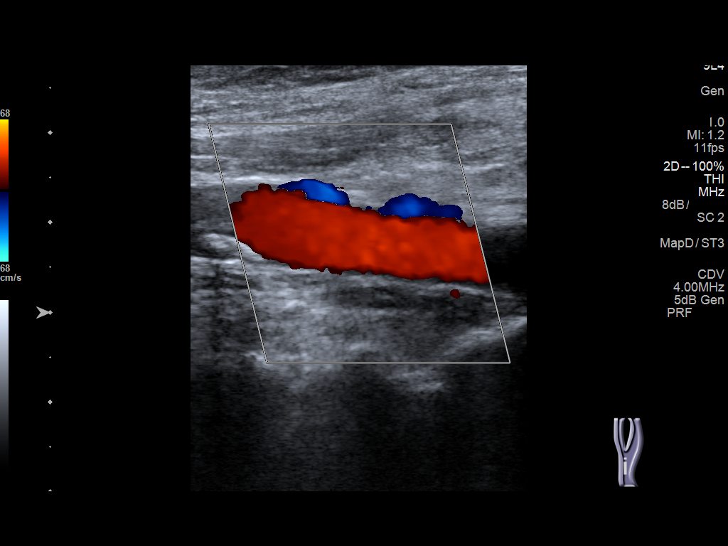
[im 49/66]
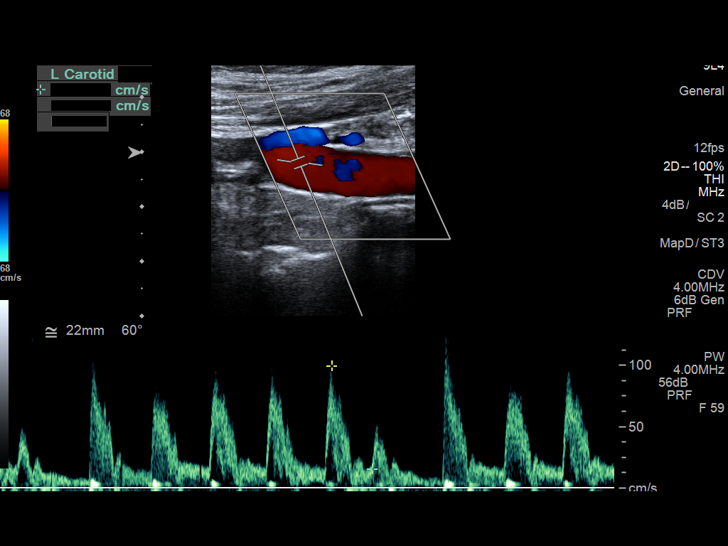
[im 54/66]
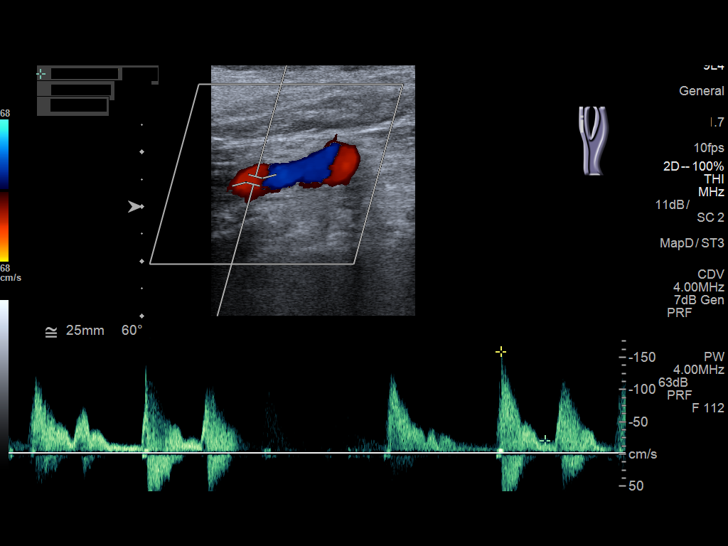
[im 60/66]
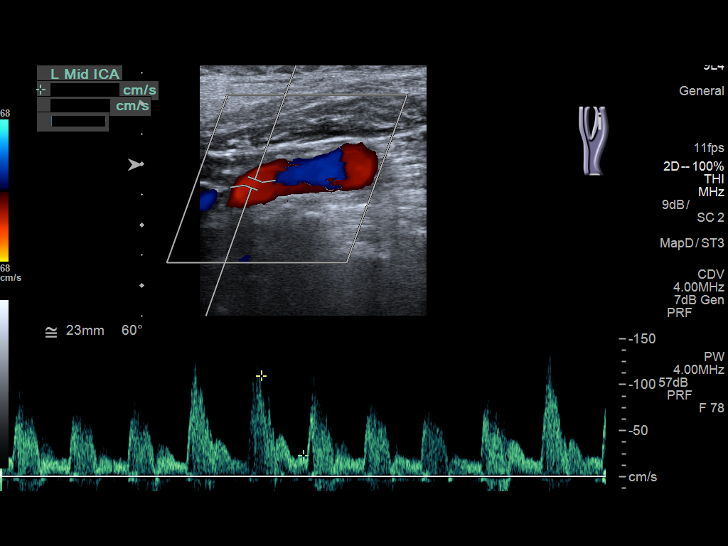
[im 66/66]
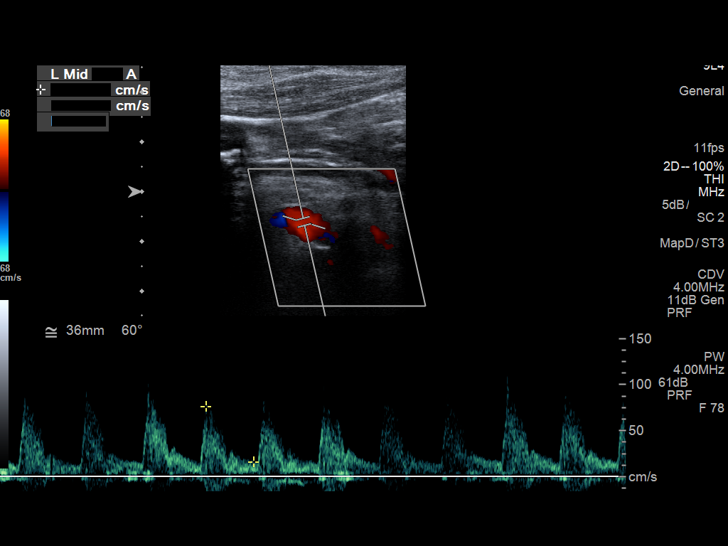

[13 of 24 positions shown; findings below may reference images not displayed]

FINDINGS: Criteria: Quantification of carotid stenosis is based on velocity
parameters that correlate the residual internal carotid diameter
with NASCET-based stenosis levels, using the diameter of the distal
internal carotid lumen as the denominator for stenosis measurement.

The following velocity measurements were obtained:

RIGHT

ICA:  96 cm/sec

CCA:  100 cm/sec

SYSTOLIC ICA/CCA RATIO:

DIASTOLIC ICA/CCA RATIO:

ECA:  218 cm/sec

LEFT

ICA:  135 cm/sec

CCA:  111 cm/sec

SYSTOLIC ICA/CCA RATIO:

DIASTOLIC ICA/CCA RATIO:

ECA:  267 cm/sec

RIGHT CAROTID ARTERY: Scattered calcified plaque in the common
carotid artery. Moderate calcified plaque in the bulb. There is more
prominent calcified plaque in the proximal external carotid artery.
Low resistance internal carotid Doppler pattern is preserved.

RIGHT VERTEBRAL ARTERY:  Antegrade.

LEFT CAROTID ARTERY: There is moderate focal calcified plaque in the
left bulb. Low resistance internal carotid Doppler pattern is
preserved.

LEFT VERTEBRAL ARTERY:  Antegrade.
IMPRESSION: Less than 50% stenosis in the right internal carotid artery.

50-69% stenosis in the left internal carotid artery.

## 2018-01-04 ENCOUNTER — Encounter: Payer: Self-pay | Admitting: Emergency Medicine

## 2018-01-04 ENCOUNTER — Inpatient Hospital Stay
Admission: EM | Admit: 2018-01-04 | Discharge: 2018-01-05 | DRG: 191 | Disposition: A | Discharge status: Home Health | Payer: Medicare Other | Attending: Internal Medicine | Admitting: Internal Medicine

## 2018-01-04 ENCOUNTER — Other Ambulatory Visit: Payer: Self-pay

## 2018-01-04 ENCOUNTER — Emergency Department: Payer: Medicare Other

## 2018-01-04 DIAGNOSIS — I130 Hypertensive heart and chronic kidney disease with heart failure and stage 1 through stage 4 chronic kidney disease, or unspecified chronic kidney disease: Secondary | ICD-10-CM | POA: Diagnosis present

## 2018-01-04 DIAGNOSIS — J441 Chronic obstructive pulmonary disease with (acute) exacerbation: Principal | ICD-10-CM | POA: Diagnosis present

## 2018-01-04 DIAGNOSIS — W19XXXS Unspecified fall, sequela: Secondary | ICD-10-CM | POA: Diagnosis not present

## 2018-01-04 DIAGNOSIS — Z8249 Family history of ischemic heart disease and other diseases of the circulatory system: Secondary | ICD-10-CM | POA: Diagnosis not present

## 2018-01-04 DIAGNOSIS — J189 Pneumonia, unspecified organism: Secondary | ICD-10-CM

## 2018-01-04 DIAGNOSIS — G4733 Obstructive sleep apnea (adult) (pediatric): Secondary | ICD-10-CM | POA: Diagnosis present

## 2018-01-04 DIAGNOSIS — E039 Hypothyroidism, unspecified: Secondary | ICD-10-CM | POA: Diagnosis not present

## 2018-01-04 DIAGNOSIS — I5032 Chronic diastolic (congestive) heart failure: Secondary | ICD-10-CM | POA: Diagnosis not present

## 2018-01-04 DIAGNOSIS — I4510 Unspecified right bundle-branch block: Secondary | ICD-10-CM | POA: Diagnosis not present

## 2018-01-04 DIAGNOSIS — E1122 Type 2 diabetes mellitus with diabetic chronic kidney disease: Secondary | ICD-10-CM | POA: Diagnosis present

## 2018-01-04 DIAGNOSIS — S42212S Unspecified displaced fracture of surgical neck of left humerus, sequela: Secondary | ICD-10-CM

## 2018-01-04 DIAGNOSIS — E785 Hyperlipidemia, unspecified: Secondary | ICD-10-CM | POA: Diagnosis present

## 2018-01-04 DIAGNOSIS — R52 Pain, unspecified: Secondary | ICD-10-CM

## 2018-01-04 DIAGNOSIS — Z7401 Bed confinement status: Secondary | ICD-10-CM

## 2018-01-04 DIAGNOSIS — Z9981 Dependence on supplemental oxygen: Secondary | ICD-10-CM

## 2018-01-04 DIAGNOSIS — Z87891 Personal history of nicotine dependence: Secondary | ICD-10-CM | POA: Diagnosis not present

## 2018-01-04 DIAGNOSIS — Z8701 Personal history of pneumonia (recurrent): Secondary | ICD-10-CM | POA: Diagnosis not present

## 2018-01-04 DIAGNOSIS — Z6835 Body mass index (BMI) 35.0-35.9, adult: Secondary | ICD-10-CM

## 2018-01-04 DIAGNOSIS — J449 Chronic obstructive pulmonary disease, unspecified: Secondary | ICD-10-CM | POA: Diagnosis present

## 2018-01-04 DIAGNOSIS — Z8349 Family history of other endocrine, nutritional and metabolic diseases: Secondary | ICD-10-CM

## 2018-01-04 DIAGNOSIS — J9611 Chronic respiratory failure with hypoxia: Secondary | ICD-10-CM | POA: Diagnosis not present

## 2018-01-04 DIAGNOSIS — J209 Acute bronchitis, unspecified: Secondary | ICD-10-CM | POA: Diagnosis not present

## 2018-01-04 DIAGNOSIS — Z7951 Long term (current) use of inhaled steroids: Secondary | ICD-10-CM

## 2018-01-04 DIAGNOSIS — N183 Chronic kidney disease, stage 3 (moderate): Secondary | ICD-10-CM | POA: Diagnosis present

## 2018-01-04 DIAGNOSIS — R0600 Dyspnea, unspecified: Secondary | ICD-10-CM

## 2018-01-04 DIAGNOSIS — Z9119 Patient's noncompliance with other medical treatment and regimen: Secondary | ICD-10-CM | POA: Diagnosis not present

## 2018-01-04 DIAGNOSIS — J440 Chronic obstructive pulmonary disease with acute lower respiratory infection: Secondary | ICD-10-CM | POA: Diagnosis present

## 2018-01-04 LAB — COMPREHENSIVE METABOLIC PANEL
ALT: 8 U/L — AB (ref 14–54)
ANION GAP: 12 (ref 5–15)
AST: 16 U/L (ref 15–41)
Albumin: 3.7 g/dL (ref 3.5–5.0)
Alkaline Phosphatase: 73 U/L (ref 38–126)
BUN: 15 mg/dL (ref 6–20)
CHLORIDE: 95 mmol/L — AB (ref 101–111)
CO2: 30 mmol/L (ref 22–32)
Calcium: 10 mg/dL (ref 8.9–10.3)
Creatinine, Ser: 0.79 mg/dL (ref 0.44–1.00)
GFR calc non Af Amer: >60 mL/min (ref >60)
Glucose, Bld: 117 mg/dL — ABNORMAL HIGH (ref 65–99)
Potassium: 3.5 mmol/L (ref 3.5–5.1)
SODIUM: 137 mmol/L (ref 135–145)
Total Bilirubin: 0.9 mg/dL (ref 0.3–1.2)
Total Protein: 7.3 g/dL (ref 6.5–8.1)

## 2018-01-04 LAB — CBC WITH DIFFERENTIAL/PLATELET
BASOS ABS: 0 10*3/uL (ref 0–0.1)
BASOS PCT: 0 %
Eosinophils Absolute: 0 10*3/uL (ref 0–0.7)
Eosinophils Relative: 0 %
HCT: 36.3 % (ref 35.0–47.0)
Hemoglobin: 11.8 g/dL — ABNORMAL LOW (ref 12.0–16.0)
Lymphocytes Relative: 11 %
Lymphs Abs: 1.4 10*3/uL (ref 1.0–3.6)
MCH: 30 pg (ref 26.0–34.0)
MCHC: 32.5 g/dL (ref 32.0–36.0)
MCV: 92.4 fL (ref 80.0–100.0)
MONO ABS: 0.5 10*3/uL (ref 0.2–0.9)
Monocytes Relative: 4 %
NEUTROS PCT: 85 %
Neutro Abs: 11.1 10*3/uL — ABNORMAL HIGH (ref 1.4–6.5)
Platelets: 288 10*3/uL (ref 150–440)
RBC: 3.93 MIL/uL (ref 3.80–5.20)
RDW: 15.7 % — AB (ref 11.5–14.5)
WBC: 13.1 10*3/uL — AB (ref 3.6–11.0)

## 2018-01-04 LAB — INFLUENZA PANEL BY PCR (TYPE A & B)
INFLBPCR: NEGATIVE
Influenza A By PCR: NEGATIVE

## 2018-01-04 LAB — TROPONIN I

## 2018-01-04 LAB — EXPECTORATED SPUTUM ASSESSMENT W GRAM STAIN, RFLX TO RESP C

## 2018-01-04 LAB — GLUCOSE, CAPILLARY
GLUCOSE-CAPILLARY: 122 mg/dL — AB (ref 65–99)
GLUCOSE-CAPILLARY: 136 mg/dL — AB (ref 65–99)
Glucose-Capillary: 116 mg/dL — ABNORMAL HIGH (ref 65–99)

## 2018-01-04 LAB — HEMOGLOBIN A1C
HEMOGLOBIN A1C: 5.1 % (ref 4.8–5.6)
Mean Plasma Glucose: 99.67 mg/dL

## 2018-01-04 LAB — EXPECTORATED SPUTUM ASSESSMENT W REFEX TO RESP CULTURE: SPECIAL REQUESTS: NORMAL

## 2018-01-04 LAB — BRAIN NATRIURETIC PEPTIDE: B Natriuretic Peptide: 95 pg/mL (ref 0.0–100.0)

## 2018-01-04 MED ORDER — IPRATROPIUM-ALBUTEROL 0.5-2.5 (3) MG/3ML IN SOLN
3.0000 mL | Freq: Four times a day (QID) | RESPIRATORY_TRACT | Status: DC | PRN
  Administered 2018-01-04: 3 mL via RESPIRATORY_TRACT
  Filled 2018-01-04: qty 3

## 2018-01-04 MED ORDER — LIOTHYRONINE SODIUM 25 MCG PO TABS
25.0000 ug | ORAL_TABLET | Freq: Every day | ORAL | Status: DC
  Administered 2018-01-05: 09:00:00 25 ug via ORAL
  Filled 2018-01-04: qty 1

## 2018-01-04 MED ORDER — ACETAMINOPHEN 325 MG PO TABS: 650.0000 mg | ORAL_TABLET | Freq: Four times a day (QID) | ORAL | Status: DC | PRN

## 2018-01-04 MED ORDER — ONDANSETRON HCL 4 MG PO TABS: 4.0000 mg | ORAL_TABLET | Freq: Four times a day (QID) | ORAL | Status: DC | PRN

## 2018-01-04 MED ORDER — IPRATROPIUM-ALBUTEROL 0.5-2.5 (3) MG/3ML IN SOLN
3.0000 mL | Freq: Once | RESPIRATORY_TRACT | Status: AC
  Administered 2018-01-04: 3 mL via RESPIRATORY_TRACT

## 2018-01-04 MED ORDER — IPRATROPIUM-ALBUTEROL 0.5-2.5 (3) MG/3ML IN SOLN
RESPIRATORY_TRACT | Status: AC
  Administered 2018-01-04: 3 mL via RESPIRATORY_TRACT
  Filled 2018-01-04: qty 3

## 2018-01-04 MED ORDER — ENOXAPARIN SODIUM 40 MG/0.4ML ~~LOC~~ SOLN
40.0000 mg | SUBCUTANEOUS | Status: DC
  Administered 2018-01-04: 23:00:00 40 mg via SUBCUTANEOUS
  Filled 2018-01-04: qty 0.4

## 2018-01-04 MED ORDER — BUDESONIDE 0.5 MG/2ML IN SUSP
0.5000 mg | Freq: Two times a day (BID) | RESPIRATORY_TRACT | Status: DC
  Administered 2018-01-04 – 2018-01-05 (×2): 0.5 mg via RESPIRATORY_TRACT
  Filled 2018-01-04 (×2): qty 2

## 2018-01-04 MED ORDER — KETOROLAC TROMETHAMINE 15 MG/ML IJ SOLN
15.0000 mg | Freq: Four times a day (QID) | INTRAMUSCULAR | Status: DC | PRN
  Administered 2018-01-04 – 2018-01-05 (×2): 15 mg via INTRAVENOUS
  Filled 2018-01-04 (×3): qty 1

## 2018-01-04 MED ORDER — INSULIN ASPART 100 UNIT/ML ~~LOC~~ SOLN
0.0000 [IU] | Freq: Three times a day (TID) | SUBCUTANEOUS | Status: DC
  Administered 2018-01-04: 19:00:00 3 [IU] via SUBCUTANEOUS
  Filled 2018-01-04: qty 1

## 2018-01-04 MED ORDER — BUPROPION HCL ER (XL) 150 MG PO TB24
150.0000 mg | ORAL_TABLET | Freq: Every day | ORAL | Status: DC
  Administered 2018-01-05: 09:00:00 150 mg via ORAL
  Filled 2018-01-04: qty 1

## 2018-01-04 MED ORDER — METHYLPREDNISOLONE SODIUM SUCC 40 MG IJ SOLR
40.0000 mg | Freq: Two times a day (BID) | INTRAMUSCULAR | Status: DC
  Administered 2018-01-04 – 2018-01-05 (×2): 40 mg via INTRAVENOUS
  Filled 2018-01-04 (×2): qty 1

## 2018-01-04 MED ORDER — OXYCODONE HCL 5 MG PO TABS
10.0000 mg | ORAL_TABLET | Freq: Four times a day (QID) | ORAL | Status: DC | PRN
  Administered 2018-01-04 – 2018-01-05 (×2): 10 mg via ORAL
  Filled 2018-01-04 (×2): qty 2

## 2018-01-04 MED ORDER — METHYLPREDNISOLONE SODIUM SUCC 125 MG IJ SOLR
125.0000 mg | Freq: Once | INTRAMUSCULAR | Status: AC
  Administered 2018-01-04: 125 mg via INTRAVENOUS
  Filled 2018-01-04: qty 2

## 2018-01-04 MED ORDER — LEVOFLOXACIN IN D5W 750 MG/150ML IV SOLN
750.0000 mg | Freq: Once | INTRAVENOUS | Status: AC
  Administered 2018-01-04: 750 mg via INTRAVENOUS
  Filled 2018-01-04: qty 150

## 2018-01-04 MED ORDER — SIMVASTATIN 20 MG PO TABS
40.0000 mg | ORAL_TABLET | Freq: Every day | ORAL | Status: DC
  Administered 2018-01-04: 23:00:00 40 mg via ORAL
  Filled 2018-01-04: qty 2

## 2018-01-04 MED ORDER — FERROUS SULFATE 325 (65 FE) MG PO TABS
325.0000 mg | ORAL_TABLET | Freq: Two times a day (BID) | ORAL | Status: DC
  Administered 2018-01-04 – 2018-01-05 (×2): 325 mg via ORAL
  Filled 2018-01-04 (×2): qty 1

## 2018-01-04 MED ORDER — LEVOFLOXACIN IN D5W 750 MG/150ML IV SOLN
750.0000 mg | INTRAVENOUS | Status: DC
  Administered 2018-01-05: 750 mg via INTRAVENOUS
  Filled 2018-01-04: qty 150

## 2018-01-04 MED ORDER — PRIMIDONE 50 MG PO TABS
50.0000 mg | ORAL_TABLET | Freq: Two times a day (BID) | ORAL | Status: DC
  Administered 2018-01-04 – 2018-01-05 (×2): 50 mg via ORAL
  Filled 2018-01-04 (×3): qty 1

## 2018-01-04 MED ORDER — CYCLOBENZAPRINE HCL 10 MG PO TABS
5.0000 mg | ORAL_TABLET | Freq: Three times a day (TID) | ORAL | Status: DC | PRN
  Administered 2018-01-04: 17:00:00 5 mg via ORAL
  Filled 2018-01-04: qty 1

## 2018-01-04 MED ORDER — ONDANSETRON HCL 4 MG/2ML IJ SOLN: 4.0000 mg | Freq: Four times a day (QID) | INTRAMUSCULAR | Status: DC | PRN

## 2018-01-04 MED ORDER — SENNOSIDES-DOCUSATE SODIUM 8.6-50 MG PO TABS: 1.0000 | ORAL_TABLET | Freq: Every evening | ORAL | Status: DC | PRN

## 2018-01-04 MED ORDER — ACETAMINOPHEN 650 MG RE SUPP: 650.0000 mg | Freq: Four times a day (QID) | RECTAL | Status: DC | PRN

## 2018-01-04 MED ORDER — GUAIFENESIN-DM 100-10 MG/5ML PO SYRP
10.0000 mL | ORAL_SOLUTION | Freq: Four times a day (QID) | ORAL | Status: DC | PRN
  Administered 2018-01-04: 10 mL via ORAL
  Filled 2018-01-04 (×2): qty 10

## 2018-01-04 MED ORDER — MONTELUKAST SODIUM 10 MG PO TABS
10.0000 mg | ORAL_TABLET | Freq: Every day | ORAL | Status: DC
  Administered 2018-01-04: 10 mg via ORAL
  Filled 2018-01-04: qty 1

## 2018-01-04 MED ORDER — UMECLIDINIUM-VILANTEROL 62.5-25 MCG/INH IN AEPB
1.0000 | INHALATION_SPRAY | Freq: Every day | RESPIRATORY_TRACT | Status: DC
  Administered 2018-01-04 – 2018-01-05 (×2): 1 via RESPIRATORY_TRACT
  Filled 2018-01-04: qty 14

## 2018-01-04 NOTE — Progress Notes (Signed)
Family Meeting Note  Advance Directive:no  Today a meeting took place with the Patient.spouse  The following clinical team members were present during this meeting:MD  The following were discussed:Patient's diagnosis: copd exacerbation, Patient's progosis: Unable to determine and Goals for treatment: Full Code  Additional follow-up to be provided: Chaplain consult to start advance directive  Time spent during discussion: 16 minutes  Enis Leatherwood, MD

## 2018-01-04 NOTE — Progress Notes (Signed)
   01/04/18 1500  Clinical Encounter Type  Visited With Patient  Visit Type Follow-up  Referral From Nurse  Spiritual Encounters  Spiritual Needs Emotional;Prayer  [place] requested to review HCPOA but husband of patient not available; [place] offered emotional and spiritual support and prayer at patient request

## 2018-01-04 NOTE — ED Notes (Signed)
Pt to floor via stretcher, report given to [facility], all questions answered. VSS. NAD

## 2018-01-04 NOTE — Evaluation (Addendum)
Physical Therapy Evaluation Patient Details Name: Sara Pierce MRN: [id number] DOB: 1941-07-16 Today's Date: 01/04/2018   History of Present Illness  Pt is a 77 y.o. female presenting to hospital with generalized weakness and inability to care for self in addition to SOB symptoms.  PMH includes non-op unstable T6 spinal fx (wears TLSO brace), h/o L elbow fx/sx (wears L UE splint); CHF, COPD, 3 L home O2, DM, htn, CKD, cerebral hemorrhage.    Clinical Impression  Pt is a pleasant 77 year old female who was admitted for SOB symptoms and generalized weakness. Due to severe pain with any movement and muscle spasms, pt very limited in participation with therapy, only able to tolerate sliding up in bed with +2 assist. Pt unable to assist in transfer. At baseline, pt is hoyer dependent, however only gets out of bed every other day due according to patient. Apparent social issues noted in previous evals. Per pt, HHPT to begin on Friday. Pt may ultimately need increased level of medical care or supervision in LTC setting vs home. Not appropriate for SNF level at this time. Pt is at baseline level this date, however may benefit from trial PT while in hospital to improve functional mobility with rolling or sitting balance. Will need TLSO present in room to perform mobility. Pt also still present with L UE splint on, limiting ability for UE movement. Pt demonstrates deficits with functional independence including strength, ROM, endurance, pain. Would benefit from skilled PT to address above deficits and promote optimal return to PLOF. Will keep on caseload during acute admission to see if progress is possible and acceptable for HHPT. Will continue to progress.      Follow Up Recommendations Home health PT;Supervision/Assistance - 24 hour    Equipment Recommendations  Hospital bed    Recommendations for Other Services       Precautions / Restrictions Precautions Precautions: Fall;Back Precaution Comments:  unstable T6 spinal fx Required Braces or Orthoses: Spinal Brace;Other Brace/Splint Spinal Brace: Thoracolumbosacral orthotic;Applied in supine position Other Brace/Splint: L UE splint (pt wears all the time except when bathing/hygiene) Restrictions Weight Bearing Restrictions: Yes Other Position/Activity Restrictions: Pt reports she wears L UE splint and does not use L UE (pt did not specify [place] restrictions but does not appear to do much [place] through it)      Mobility  Bed Mobility Overal bed mobility: Needs Assistance Bed Mobility: (scooting up in bed) Rolling: Max assist         General bed mobility comments: Pt slid down in bed, requesting to slide up, however reports she is total assist, unable to help. Needed +2 for scooting up towards [place]. Unable to roll or perform further bed mobility secondary to spasms in B LE and back pain  Transfers                 General transfer comment: Did not perform, patient is hoyer dependent.   Ambulation/Gait                Stairs            Wheelchair Mobility    Modified Rankin (Stroke Patients Only)       Balance Overall balance assessment: History of Falls                                           Pertinent Vitals/Pain Pain  Assessment: Faces Faces Pain Scale: Hurts whole lot Pain Location: reports chronic B LE spasms and pain in back (feels like "knot"in back" relates to hospital matress.  Pain Descriptors / Indicators: Aching;Sore Pain Intervention(s): Limited activity within patient's tolerance;Repositioned    Home Living Family/patient expects to be discharged to:: Private residence Living Arrangements: Spouse/significant other Available Help at Discharge: Available PRN/intermittently;Family Type of Home: House Home Access: Ramped entrance     Home Layout: One level   Additional Comments: Lives with her husband at home.  Has hospital bed, hoyer lift and manual w/c at home.     Prior Function Level of Independence: Needs assistance   Gait / Transfers Assistance Needed: Pt reports not walking since sustaining thoracic spine fx and L UE fx August 31st 2018; did go to rehab (was involved with [facility] until she left rehab).  Has been hoyer lift dependent since she left rehab (pt unable to state date she left rehab) but does report she can balance sitting up on her own but requires assist to get into that position.  Pt reports she requires assist for bed mobility (including logrolling in bed for using bed pan).  Husband pushes pt in w/c (pt reports w/c too big for doorways in home though).  ADL's / Homemaking Assistance Needed: Patient reports her husband provides all of her care, she reports she can feed herself here at the hospital but he feeds her at home, he performs a sponge bath, she cannot transfer to [facility] and used to use a bedpan however she reports this is too difficult for her husband who has back issues/pain therefore he puts a pad underneath her and changes it.  She reports her husband will get her up via hoyer lift every other day and she stays in bed on the off days.  she will sit up in the wheelchair for 3-4 hours at a time.  She reports she has worn a left arm splint since Aug 31st of last year after a humerus fracture and has limited follow up regarding the arm.  She reports she takes it out of the brace about once a month to stretch her arm out.  She is unable to make a fist or use the left hand functionally at this point.  She reports she primarily washes her face and brushes her teeth.    Comments: Patient reports the bedrail on her bed fell off about 3 weeks ago and she fell off the bed onto the railing onto her left side.  Since that time she has had pain in her left arm.       Hand Dominance   Dominant Hand: Right    Extremity/Trunk Assessment   Upper Extremity Assessment Upper Extremity Assessment: Defer to OT evaluation RUE Deficits / Details: shoulder  flexion AROM to grossly 100 degrees; at least 4/5 strength shoulder flexion, elbow flexion/extension LUE Deficits / Details: Patient removed her splint and demonstrates elbow extension -45 degrees, lacks full finger flexion on the left, fingers lack DIP and PIP flexion and remain extended.  1/2 range for MP flexion.  Did not test arm further since patient has not had a reported follow up since fracture and has new onset of pain from recent fall out of bed.  LUE: Unable to fully assess due to immobilization    Lower Extremity Assessment Lower Extremity Assessment: Generalized weakness RLE Deficits / Details: increased spasms in R LE limiting functional assessment at this time. Grossly 2/5, unable to lift off bed.  Plantarflexion noted in ankle, unable to extend LLE Deficits / Details: Maintains position in knee flexion, unable to extend fully. Plantarflexion noted in B feet, unable to fully extend. Unable to lift off bed, grossly 2/5       Communication   Communication: No difficulties  Cognition Arousal/Alertness: Awake/alert Behavior During Therapy: WFL for tasks assessed/performed Overall Cognitive Status: Within Functional Limits for tasks assessed                                        General Comments      Exercises     Assessment/Plan    PT Assessment Patient needs continued PT services  PT Problem List Decreased strength;Decreased range of motion;Decreased activity tolerance;Decreased balance;Decreased mobility;Pain;Decreased skin integrity       PT Treatment Interventions Functional mobility training;Therapeutic activities;Therapeutic exercise;Balance training;Patient/family education    PT Goals (Current goals can be found in the Care Plan section)  Acute Rehab PT Goals Patient Stated Goal: patient reports she wants to go home, do as much as she did before PT Goal Formulation: With patient Time For Goal Achievement: 01/18/18 Potential to Achieve Goals:  Fair    Frequency (3 visit trial)   Barriers to discharge Decreased caregiver support      Co-evaluation               AM-PAC PT "6 Clicks" Daily Activity  Outcome Measure Difficulty turning over in bed (including adjusting bedclothes, sheets and blankets)?: Unable Difficulty moving from lying on back to sitting on the side of the bed? : Unable Difficulty sitting down on and standing up from a chair with arms (e.g., wheelchair, bedside commode, etc,.)?: Unable Help needed moving to and from a bed to chair (including a wheelchair)?: Total Help needed walking in hospital room?: Total Help needed climbing 3-5 steps with a railing? : Total 6 Click Score: 6    End of Session Equipment Utilized During Treatment: Oxygen Activity Tolerance: Patient limited by pain Patient left: in bed;with call bell/phone within reach Nurse Communication: Mobility status;Patient requests pain meds PT Visit Diagnosis: Other abnormalities of gait and mobility (R26.89);Muscle weakness (generalized) (M62.81);History of falling (Z91.81);Pain Pain - Right/Left: Right Pain - part of body: Leg    Time: 1610-9604 PT Time Calculation (min) (ACUTE ONLY): 12 min   Charges:   PT Evaluation $PT Eval High Complexity: 1 High     PT G CodesGreggory Stallion, PT, DPT [phone]   Lorrine Killilea 01/04/2018, 5:11 PM

## 2018-01-04 NOTE — H&P (Addendum)
[facility] [place] NAME: Revella Shelton    MR#:  [id number]  DATE OF BIRTH:  01-21-1941  DATE OF ADMISSION:  01/04/2018  PRIMARY CARE PHYSICIAN: Gareth Morgan, MD   REQUESTING/REFERRING PHYSICIAN: dr Jimmye Norman  CHIEF COMPLAINT:   SOB HISTORY OF PRESENT ILLNESS:  Kathleene Bergemann  is a 77 y.o. female with a known history of heart hypoxic respiratory failure on 3 L oxygen due to COPD, chronic kidney disease stage III, chronic diastolic heart failure and morbid obesity who presents with shortness of breath. Patient reports over the past few days she has had increasing shortness breath, wheezing and purulent sputum. She denies fever or chills. In the emergency room she continues to have wheezing despite IV steroids and nebulizer treatments. She is started on Levaquin for presumed pneumonia chest x-ray does not show evidence of pneumonia.  She was diagnosed recently with compression fracture of her thoracic vertebrae and she is status post ORIF left humerus  PAST MEDICAL HISTORY:   Past Medical History:  Diagnosis Date  . Arthritis   . Cardiomyopathy ([place])   . Cataract   . Cerebral hemorrhage ([place])   . CHF (congestive heart failure) ([place])   . Chronic kidney disease   . Clotting disorder ([place])   . COPD (chronic obstructive pulmonary disease) ([place])   . Depression   . Diabetes mellitus without complication ([place])   . Hypertension   . Mixed incontinence   . Obesity   . Sleep apnea   . Thyroid disease   . Urinary frequency   . Vaginal atrophy   . Varicose veins     PAST SURGICAL HISTORY:   Past Surgical History:  Procedure Laterality Date  . APPENDECTOMY    . Cardiac Catherization    . COLONOSCOPY WITH PROPOFOL N/A 04/28/2016   Procedure: COLONOSCOPY WITH PROPOFOL;  Surgeon: Manya Silvas, MD;  Location: [facility] ENDOSCOPY;  Service: Endoscopy;  Laterality: N/A;  . COLONOSCOPY WITH PROPOFOL N/A 04/29/2016   Procedure: COLONOSCOPY WITH  PROPOFOL;  Surgeon: Manya Silvas, MD;  Location: [facility] ENDOSCOPY;  Service: Endoscopy;  Laterality: N/A;  . JOINT REPLACEMENT    . STRABISMUS SURGERY    . TONSILLECTOMY    . TUBAL LIGATION      SOCIAL HISTORY:   Social History   Tobacco Use  . Smoking status: Former Smoker    Packs/day: 0.50    Years: 44.00    Pack years: 22.00    Types: Cigarettes  . Smokeless tobacco: Never Used  Substance Use Topics  . Alcohol use: No    FAMILY HISTORY:   Family History  Problem Relation Age of Onset  . Heart attack Father   . Coronary artery disease Father   . Hypertension Father   . Hyperlipidemia Father   . Heart attack Mother   . Hypertension Mother   . Hyperlipidemia Mother   . Breast cancer Neg Hx     DRUG ALLERGIES:   Allergies  Allergen Reactions  . Aspirin Swelling  . Codeine Itching  . Naprosyn [Naproxen] Other (See Comments)    Per MAR pt allergic  . Other     Elastic Bandages/ Supports Per MAR  . Tape Hives  . Valacyclovir     REVIEW OF SYSTEMS:   Review of Systems  Constitutional: Negative.  Negative for chills, fever and malaise/fatigue.  HENT: Negative.  Negative for ear discharge, ear pain, hearing loss, nosebleeds and sore throat.   Eyes: Negative.  Negative for blurred vision and pain.  Respiratory: Positive for cough, sputum production, shortness of breath and wheezing. Negative for hemoptysis.   Cardiovascular: Negative.  Negative for chest pain, palpitations and leg swelling.  Gastrointestinal: Negative.  Negative for abdominal pain, blood in stool, diarrhea, nausea and vomiting.  Genitourinary: Negative.  Negative for dysuria.  Musculoskeletal: Negative.  Negative for back pain.  Skin: Negative.   Neurological: Negative for dizziness, tremors, speech change, focal weakness, seizures and headaches.  Endo/Heme/Allergies: Negative.  Does not bruise/bleed easily.  Psychiatric/Behavioral: Negative.  Negative for depression, hallucinations and  suicidal ideas.    MEDICATIONS AT HOME:   Prior to Admission medications   Medication Sig Start Date End Date Taking? Authorizing Provider  budesonide (PULMICORT) 0.5 MG/2ML nebulizer solution Take 2 mLs (0.5 mg total) 2 (two) times daily by nebulization. 10/28/17  Yes Demetrios Loll, MD  buPROPion (WELLBUTRIN XL) 150 MG 24 hr tablet Take 150 mg by mouth daily.   Yes [provider]  ferrous sulfate 325 (65 FE) MG tablet Take 1 tablet (325 mg total) 2 (two) times daily with a meal by mouth. 10/28/17  Yes Demetrios Loll, MD  hydrOXYzine (ATARAX/VISTARIL) 25 MG tablet Take 25 mg by mouth 3 (three) times daily as needed.   Yes [provider]  liothyronine (CYTOMEL) 25 MCG tablet Take 25 mcg by mouth daily.    Yes [provider]  magnesium oxide (MAG-OX) 400 MG tablet Take 400 mg by mouth daily.    Yes [provider]  montelukast (SINGULAIR) 10 MG tablet Take 10 mg by mouth at bedtime.    Yes [provider]  Oxycodone HCl 10 MG TABS Take 10 mg every 6 (six) hours as needed by mouth for moderate pain, severe pain or breakthrough pain.    Yes [provider]  potassium chloride (K-DUR) 10 MEQ tablet Take 10 mEq by mouth daily.    Yes [provider]  primidone (MYSOLINE) 50 MG tablet Take 50 mg by mouth 2 (two) times daily.    Yes [provider]  senna-docusate (SENOKOT-S) 8.6-50 MG tablet Take 2 tablets by mouth daily.   Yes [provider]  simvastatin (ZOCOR) 40 MG tablet Take 40 mg by mouth at bedtime.    Yes [provider]  albuterol (PROVENTIL HFA;VENTOLIN HFA) 108 (90 Base) MCG/ACT inhaler Inhale 2 puffs into the lungs every 6 (six) hours as needed for wheezing or shortness of breath. Patient not taking: Reported on 10/24/2017 06/15/17   Rudene Re, MD  albuterol (PROVENTIL) (2.5 MG/3ML) 0.083% nebulizer solution Take 2.5 mg by nebulization every 4 (four) hours as needed for wheezing or shortness of  breath.    [provider]  docusate sodium (COLACE) 100 MG capsule Take 1 capsule (100 mg total) by mouth 2 (two) times daily as needed for mild constipation. Patient not taking: Reported on 09/11/2017 10/04/16   Nicholes Mango, MD  fluticasone (FLONASE) 50 MCG/ACT nasal spray Place 2 sprays into both nostrils daily.     [provider]  guaiFENesin-dextromethorphan (ROBITUSSIN DM) 100-10 MG/5ML syrup Take 10 mLs by mouth every 6 (six) hours as needed for cough. Patient not taking: Reported on 10/24/2017 10/04/16   Nicholes Mango, MD  ipratropium-albuterol (DUONEB) 0.5-2.5 (3) MG/3ML SOLN Inhale 3 mLs into the lungs every 6 (six) hours as needed (wheezing).     [provider]  naloxone [facility]) nasal spray 4 mg/0.1 mL Place 1 spray into the nose. Repeat every 3 minutes if  no response.    [provider]  nystatin cream (MYCOSTATIN) Apply 1 application topically 2 (two) times daily.    [provider]  predniSONE (DELTASONE) 20 MG tablet Take 2 tablets (40 mg total) by mouth daily. Patient not taking: Reported on 01/04/2018 12/14/17   Harvest Dark, MD  sodium chloride (OCEAN) 0.65 % SOLN nasal spray Place 2 sprays into both nostrils 3 (three) times daily as needed for congestion. Patient not taking: Reported on 10/24/2017 10/04/16   Nicholes Mango, MD  tiZANidine (ZANAFLEX) 4 MG tablet Take 4 mg by mouth every 12 (twelve) hours as needed for muscle spasms.    [provider]  umeclidinium-vilanterol (ANORO ELLIPTA) 62.5-25 MCG/INH AEPB Inhale 1 puff into the lungs daily.    [provider]  vancomycin 1,250 mg in sodium chloride 0.9 % 250 mL Inject 1,250 mg daily into the vein. Patient not taking: Reported on 11/25/2017 10/28/17   Demetrios Loll, MD      VITAL SIGNS:  Blood pressure 99/66, pulse 90, temperature 98.3 F (36.8 C), resp. rate 12, height 5\' 5"  (1.651 m), weight 113.4 kg (250 lb), SpO2 94 %.  PHYSICAL EXAMINATION:    Physical Exam  Constitutional: She is oriented to person, place, and time. No distress.  Morbidly obese  HENT:  Head: Normocephalic.  Eyes: No scleral icterus.  Neck: Normal range of motion. Neck supple. No JVD present. No tracheal deviation present.  Cardiovascular: Normal rate, regular rhythm and normal heart sounds. Exam reveals no gallop and no friction rub.  No murmur heard. Pulmonary/Chest: Effort normal. No respiratory distress. She has no rales. She exhibits no tenderness.  Abdominal: Soft. Bowel sounds are normal. She exhibits no distension and no mass. There is no tenderness. There is no rebound and no guarding.  Musculoskeletal: She exhibits no edema.  Left arm is in a sling  Neurological: She is alert and oriented to person, place, and time.  Skin: Skin is warm. No rash noted. No erythema.  Psychiatric: Affect and judgment normal.      LABORATORY PANEL:   CBC Recent Labs  Lab 01/04/18 0838  WBC 13.1*  HGB 11.8*  HCT 36.3  PLT 288   ------------------------------------------------------------------------------------------------------------------  Chemistries  Recent Labs  Lab 01/04/18 0838  NA 137  K 3.5  CL 95*  CO2 30  GLUCOSE 117*  BUN 15  CREATININE 0.79  CALCIUM 10.0  AST 16  ALT 8*  ALKPHOS 73  BILITOT 0.9   ------------------------------------------------------------------------------------------------------------------  Cardiac Enzymes Recent Labs  Lab 01/04/18 0838  TROPONINI <0.03   ------------------------------------------------------------------------------------------------------------------  RADIOLOGY:  Dg Chest Port 1 View  Result Date: 01/04/2018 CLINICAL DATA:  Dyspnea EXAM: PORTABLE CHEST 1 VIEW COMPARISON:  12/25/2017 FINDINGS: Chronic cardiomegaly and main pulmonary artery enlargement. Large lung volumes. Patient has history of COPD. There is a linear density over the right mid lung that is stable. This correlates with  a scar-like appearance on a December 2018 chest CT. There is no edema, consolidation, effusion, or pneumothorax. Remote left humeral neck fracture. IMPRESSION: 1. No acute finding when compared to prior. 2. Cardiomegaly and pulmonary artery enlargement/hypertension. 3. COPD. Electronically Signed   By: Monte Fantasia M.D.   On: 01/04/2018 08:54    EKG:   Normal sinus rhythm no ST elevation or depression  IMPRESSION AND PLAN:   77 year old Shaakira Borrero the obese female with history of chronic hypoxic respiratory failure on 3 L of oxygen due to COPD, OSA not compliant with CPAP and chronic diastolic  heart failure with preserved ejection fraction who presented shortness of breath.  1. Acute COPD exacerbation with chronic hypoxic respiratory failure: Patient is on baseline 3 L oxygen Continue IV steroids Continue nebulizer and inhalers  2. Presumed CAP: Continue Levaquin and follow up on blood culture  3. Chronic diastolic heart failure without signs of exacerbation  4. History of left humerus ORIF and compression fracture: OT and PT consultation requested  5. Hypothyroidism: Continue Cytomel  6. Hyperlipidemia: Continue statin  7. Chronic kidney disease stage III: Monitor creatinine  8. Diabetes: Diet controlled  Start SS  9. OSA: Patient not compliant with with CPAP. She uses nocturnal oxygen  All the records are reviewed and case discussed with ED provider. Management plans discussed with the patient and she is in agreement  CODE STATUS: FULL  TOTAL TIME TAKING CARE OF THIS PATIENT: 42 minutes.    Jeffery Bachmeier M.D on 01/04/2018 at 11:19 AM  Between 7am to 6pm - Pager - [phone]  After 6pm go to [email or web address] - password [email or web address] [facility]  Office  [phone]  CC: Primary care physician; Gareth Morgan, MD

## 2018-01-04 NOTE — ED Provider Notes (Signed)
[facility] Emergency Department Provider Note       Time seen: ----------------------------------------- 8:25 AM on 01/04/2018 -----------------------------------------   I have reviewed the triage vital signs and the nursing notes.  HISTORY   Chief Complaint No chief complaint on file.    HPI Sara Pierce is a 77 y.o. female with a history of cardiomyopathy, CHF, chronic kidney disease, COPD, diabetes, hypertension who presents to the ED for shortness of breath and congestion.  Patient reports for the last day she has had congestion and cough.  She states she was here recently and seen by her pulmonologist who changed her medications around but is not sure which ones they actually changed.  She states usually she is able to suction out her phlegm but not much is coming up.  Nothing makes her symptoms better or worse.  She denies fevers or chills or other complaints.  Patient states she may be putting on fluid as well.  Past Medical History:  Diagnosis Date  . Arthritis   . Cardiomyopathy ([place])   . Cataract   . Cerebral hemorrhage ([place])   . CHF (congestive heart failure) ([place])   . Chronic kidney disease   . Clotting disorder ([place])   . COPD (chronic obstructive pulmonary disease) ([place])   . Depression   . Diabetes mellitus without complication ([place])   . Hypertension   . Mixed incontinence   . Obesity   . Sleep apnea   . Thyroid disease   . Urinary frequency   . Vaginal atrophy   . Varicose veins     Patient Active Problem List   Diagnosis Date Noted  . Pressure injury of skin 10/25/2017  . COPD with exacerbation ([place]) 10/24/2017  . HCAP (healthcare-associated pneumonia) 08/19/2017  . CAP (community acquired pneumonia) 10/03/2016  . Sepsis ([place]) 11/15/2015  . Absolute anemia 03/15/2015  . Anxiety 03/15/2015  . Arthritis 03/15/2015  . Cardiomyopathy, secondary ([place]) 03/15/2015  . Cataract 03/15/2015  . Brain bleed ([place]) 03/15/2015  .  Chronic constipation 03/15/2015  . Chronic kidney failure 03/15/2015  . Blood clotting disorder ([place]) 03/15/2015  . CAFL (chronic airflow limitation) ([place]) 03/15/2015  . Clinical depression 03/15/2015  . Diabetes ([place]) 03/15/2015  . H/O varicella 03/15/2015  . BP (high blood pressure) 03/15/2015  . Mixed incontinence 03/15/2015  . 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 Adiposity 03/15/2015  . Apnea, sleep 03/15/2015  . Disease of thyroid gland 03/15/2015  . FOM (frequency of micturition) 03/15/2015  . 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 Candida vaginitis 03/15/2015  . Abnormal ECG 03/15/2015    Past Surgical History:  Procedure Laterality Date  . APPENDECTOMY    . Cardiac Catherization    . COLONOSCOPY WITH PROPOFOL N/A 04/28/2016   Procedure: COLONOSCOPY WITH PROPOFOL;  Surgeon: Manya Silvas, MD;  Location: [facility] ENDOSCOPY;  Service: Endoscopy;  Laterality: N/A;  . COLONOSCOPY WITH PROPOFOL N/A 04/29/2016   Procedure: COLONOSCOPY WITH PROPOFOL;  Surgeon: Manya Silvas, MD;  Location: [facility] ENDOSCOPY;  Service: Endoscopy;  Laterality: N/A;  . JOINT REPLACEMENT    . STRABISMUS SURGERY    . TONSILLECTOMY    . TUBAL LIGATION      Allergies Aspirin; Codeine; Naprosyn [naproxen]; Other; Tape; and Valacyclovir  Social History Social History   Tobacco Use  . Smoking status: Former Smoker    Packs/day: 0.50    Years: 44.00    Pack years: 22.00    Types: Cigarettes  .  Smokeless tobacco: Never Used  Substance Use Topics  . Alcohol use: No  . Drug use: No    Review of Systems Constitutional: Negative for fever. Eyes: Negative for vision changes ENT: Positive for congestion Cardiovascular: Negative for chest pain. Respiratory: Negative for shortness of breath.  Positive for cough Gastrointestinal: Negative for abdominal pain, vomiting and diarrhea. Musculoskeletal: Negative for back pain. Skin: Negative for rash. Neurological:  Negative for headaches, focal weakness or numbness.  All systems negative/normal/unremarkable except as stated in the HPI  ____________________________________________   PHYSICAL EXAM:  VITAL SIGNS: ED Triage Vitals  Enc Vitals Group     BP      Pulse      Resp      Temp      Temp src      SpO2      Weight      Height      Head Circumference      Peak Flow      Pain Score      Pain Loc      Pain Edu?      Excl. in [place]?     Constitutional: Alert and oriented.  Chronically ill-appearing, no distress Eyes: Conjunctivae are normal. Normal extraocular movements. ENT   Head: Normocephalic and atraumatic.   Nose: No congestion/rhinnorhea.   Mouth/Throat: Mucous membranes are moist.   Neck: No stridor. Cardiovascular: Normal rate, regular rhythm. No murmurs, rubs, or gallops. Respiratory: Normal respiratory effort without tachypnea nor retractions. Breath sounds are clear and equal bilaterally. No wheezes/rales/rhonchi. Gastrointestinal: Soft and nontender. Normal bowel sounds Musculoskeletal: Nontender with normal range of motion in extremities. No lower extremity tenderness nor edema. Neurologic:  Normal speech and language. No gross focal neurologic deficits are appreciated.  Skin:  Skin is warm, dry and intact. No rash noted. Psychiatric: Mood and affect are normal. Speech and behavior are normal.  ____________________________________________  EKG: Interpreted by me.  Sinus rhythm the rate 88 bpm, short PR interval, right bundle branch block, nonspecific T wave changes, baseline artifact  ____________________________________________  ED COURSE:  As part of my medical decision making, I reviewed the following data within the [place] History obtained from family if available, nursing notes, old chart and ekg, as well as notes from prior ED visits. Patient presented for shortness of breath and congestion, we will assess with labs and imaging  as indicated at this time.   Procedures ____________________________________________   LABS (pertinent positives/negatives)  Labs Reviewed  CBC WITH DIFFERENTIAL/PLATELET - Abnormal; Notable for the following components:      Result Value   WBC 13.1 (*)    Hemoglobin 11.8 (*)    RDW 15.7 (*)    Neutro Abs 11.1 (*)    All other components within normal limits  COMPREHENSIVE METABOLIC PANEL - Abnormal; Notable for the following components:   Chloride 95 (*)    Glucose, Bld 117 (*)    ALT 8 (*)    All other components within normal limits  CULTURE, EXPECTORATED SPUTUM-ASSESSMENT  BRAIN NATRIURETIC PEPTIDE  TROPONIN I  INFLUENZA PANEL BY PCR (TYPE A & B)    RADIOLOGY Images were viewed by me  Chest x-ray IMPRESSION: 1. No acute finding when compared to prior. 2. Cardiomegaly and pulmonary artery enlargement/hypertension. 3. COPD. ____________________________________________  DIFFERENTIAL DIAGNOSIS   CHF, COPD, pneumonia, influenza, URI  FINAL ASSESSMENT AND PLAN  Dyspnea, COPD exacerbation   Plan: Patient had presented for dyspnea and congestion. Patient's labs revealed mild  leukocytosis but are otherwise unremarkable. Patient's imaging did not reveal any focal pneumonia.  She has had copious phlegm production in the ER.  We have sent a sputum culture.  I did order IV Levaquin to cover for developing pneumonia.  Her influenza test is negative.  Patient states she does not feel well enough to go home.  I will discussed with the hospitalist for observation.   Earleen Newport, MD   Note: This note was generated in part or whole with voice recognition software. Voice recognition is usually quite accurate but there are transcription errors that can and very often do occur. I apologize for any typographical errors that were not detected and corrected.     Earleen Newport, MD 01/04/18 1043

## 2018-01-04 NOTE — ED Triage Notes (Signed)
Pt from home via [facility], with complaints of shortness of breath. Pt was here recently and seen her pulmonologist who changed her medications around but pt is unsure which ones. She state that she usually is able to suction out her ph[place] but states not much is coming up

## 2018-01-04 NOTE — Progress Notes (Signed)
ANTIBIOTIC CONSULT NOTE - INITIAL  Pharmacy Consult for levofloxacin Indication: CAP  Allergies  Allergen Reactions  . Aspirin Swelling  . Codeine Itching  . Naprosyn [Naproxen] Other (See Comments)    Per MAR pt allergic  . Other     Elastic Bandages/ Supports Per MAR  . Tape Hives  . Valacyclovir     Patient Measurements: Height: 5\' 5"  (165.1 cm) Weight: 250 lb (113.4 kg) IBW/kg (Calculated) : 57 Adjusted Body Weight:   Vital Signs: Temp: 98 F (36.7 C) (01/23 1212) Temp Source: Oral (01/23 1212) BP: 100/59 (01/23 1212) Pulse Rate: 87 (01/23 1212) Intake/Output from previous day: No intake/output data recorded. Intake/Output from this shift: No intake/output data recorded.  Labs: Recent Labs    01/04/18 0838  WBC 13.1*  HGB 11.8*  PLT 288  CREATININE 0.79   Estimated Creatinine Clearance: 77.5 mL/min (by C-G formula based on SCr of 0.79 mg/dL). No results for input(s): VANCOTROUGH, VANCOPEAK, VANCORANDOM, GENTTROUGH, GENTPEAK, GENTRANDOM, TOBRATROUGH, TOBRAPEAK, TOBRARND, AMIKACINPEAK, AMIKACINTROU, AMIKACIN in the last 72 hours.   Microbiology: Recent Results (from the past 720 hour(s))  Urine Culture     Status: None   Collection Time: 12/25/17  9:40 PM  Result Value Ref Range Status   Specimen Description   Final    URINE, CATHETERIZED Performed at [facility], [address] [place], [place] [address]    Special Requests   Final    NONE Performed at [facility], [address], [place], [place] [address]    Culture   Final    NO GROWTH Performed at [facility], [place] [address], [place], [place] [address]    Report Status 12/27/2017 FINAL  Final    Medical History: Past Medical History:  Diagnosis Date  . Arthritis   . Cardiomyopathy ([place])   . Cataract   . Cerebral hemorrhage ([place])   . CHF (congestive heart failure) ([place])   . Chronic kidney disease   . Clotting disorder ([place])   . COPD (chronic obstructive  pulmonary disease) ([place])   . Depression   . Diabetes mellitus without complication ([place])   . Hypertension   . Mixed incontinence   . Obesity   . Sleep apnea   . Thyroid disease   . Urinary frequency   . Vaginal atrophy   . Varicose veins     Medications:  Infusions:  . [START ON 01/05/2018] levofloxacin (LEVAQUIN) IV     Assessment: 56 yof cc SOB with PMH hypoxic respiratory failure on 3L O2 d/t COPD, CKD3, HFpEF. Increasing SOB, wheezing in ED despite IV steroids and nebs. Pharmacy consulted to dose levofloxacin for CAP.   Goal of Therapy:  Resolve infection Prevent ADE  Plan:  Levofloxacin 750 mg IV Q24H  Laural Benes, Pharm.D., BCPS Clinical Pharmacist 01/04/2018,12:14 PM

## 2018-01-04 NOTE — Progress Notes (Signed)
   01/04/18 1100  Clinical Encounter Type  Visited With Family;Patient not available  Visit Type Initial;Other (Comment) (HCPOA)  Referral From Physician  Spiritual Encounters  Spiritual Needs Literature     01/04/18 1100  Clinical Encounter Type  Visited With Family;Patient not available  Visit Type Initial;Other (Comment) (HCPOA)  Referral From Physician  Spiritual Encounters  Spiritual Needs Literature  HCPOA/AD materials dropped off with patient.  [place] available to review as needed.  [place] discussed with family during patient transport.

## 2018-01-04 NOTE — Evaluation (Signed)
Occupational Therapy Evaluation Patient Details Name: Sara Pierce MRN: [id number] DOB: [birth year] Today's Date: 01/04/2018    History of Present Illness Pt is a 77 y.o. female presenting to hospital with generalized weakness and inability to care for self.  PMH includes non-op unstable T6 spinal fx (wears TLSO brace), h/o L elbow fx/sx (wears L UE splint); CHF, COPD, 3 L home O2, DM, htn, CKD, cerebral hemorrhage.     Clinical Impression   Patient seen for OT evaluation this date.  Patient lives with her husband in a one story home.  She reports she has a son nearby who will come and help as needed.  She reports she had a Left humerus fracture 08-12-17 with compression fracture in thoracic area and since that time she has been hoyer dependent for transfers.  Her husband provides all self care including feeding her, light meal prep and homemaking.  She reports she only washes her face and brushes her  teeth. She reports she is still wearing the same splint on the left elbow and forearm from last fall, she reports she removes it once a month to move her left arm, states, "They kept telling me they would get me another splint but haven't yet."  Her left hand lacks flexion at the digits with very limited fisting motion.  Fingers tend to stay in extension at PIP and DIPs with slight flexion of MCPs.  She demonstrates a few degrees of thumb extension.  She reports she has been seen a couple of times regarding her arm in [place] but reports no changes have been made since the fracture and no previous therapy for the arm. She does not currently exhibit any functional use of the left arm or hand and would likely benefit from an ortho consultation regarding the arm since she reports a recent fall with increased pain since that time. She would benefit from skilled OT to increase her independence in daily tasks including self feeding consistently,  And UB bathing and dressing. She would likely benefit from follow  up regarding her left UE once it is reevaluated with clarification for any ROM, strengthening or further splinting.  Given her recurrent recent admissions, the amount of assistance she requires, she may need long term care however she reports she would like to return home and states, "I refuse to go to a nursing home".  If she were to discharge home, recommend home health OT.      Follow Up Recommendations  SNF    Equipment Recommendations       Recommendations for Other Services       Precautions / Restrictions Precautions Precautions: Fall;Back Precaution Comments: unstable T6 spinal fx Other Brace/Splint: L UE splint (pt wears all the time except when bathing/hygiene) Restrictions Weight Bearing Restrictions: Yes Other Position/Activity Restrictions: Pt reports she wears L UE splint and does not use L UE (pt did not specify [place] restrictions but does not appear to do much [place] through it)      Mobility Bed Mobility Overal bed mobility: Needs Assistance Bed Mobility: Rolling Rolling: Max assist            Transfers                 General transfer comment: Did not perform, patient is hoyer dependent.     Balance  ADL either performed or assessed with clinical judgement   ADL Overall ADL's : Needs assistance/impaired Eating/Feeding: Minimal assistance Eating/Feeding Details (indicate cue type and reason): patient requires assist to cut food and set up tray, then she is able to feed self however she reports at home she allows her husband to feed her.  Grooming: Set up;Wash/dry face;Oral care   Upper Body Bathing: Maximal assistance   Lower Body Bathing: Total assistance   Upper Body Dressing : Maximal assistance   Lower Body Dressing: Total assistance     Toilet Transfer Details (indicate cue type and reason): Patient has not transferred to [facility] or toilet since Aug 2018.  She utilizes diapers  and pads at home with husband assist.        Clinical cytogeneticist Details (indicate cue type and reason): sponge baths only with assist from husband         Vision Baseline Vision/History: Wears glasses       Perception     Praxis      Pertinent Vitals/Pain Pain Assessment: 0-10 Pain Location: Patient reports constant pain in her left arm in the last few weeks but denies any pain this date due to taking pain medication.  Pain Descriptors / Indicators: Aching;Sore Pain Intervention(s): Limited activity within patient's tolerance;Monitored during session;Repositioned;Premedicated before session     Hand Dominance Right   Extremity/Trunk Assessment Upper Extremity Assessment Upper Extremity Assessment: LUE deficits/detail;RUE deficits/detail RUE Deficits / Details: shoulder flexion AROM to grossly 100 degrees; at least 4/5 strength shoulder flexion, elbow flexion/extension LUE Deficits / Details: Patient removed her splint and demonstrates elbow extension -45 degrees, lacks full finger flexion on the left, fingers lack DIP and PIP flexion and remain extended.  1/2 range for MP flexion.  Did not test arm further since patient has not had a reported follow up since fracture and has new onset of pain from recent fall out of bed.  LUE: Unable to fully assess due to immobilization           Communication Communication Communication: No difficulties   Cognition Arousal/Alertness: Awake/alert Behavior During Therapy: WFL for tasks assessed/performed Overall Cognitive Status: Within Functional Limits for tasks assessed                                     General Comments       Exercises     Shoulder Instructions      Home Living Family/patient expects to be discharged to:: Private residence Living Arrangements: Spouse/significant other Available Help at Discharge: Available PRN/intermittently;Family Type of Home: House Home Access: Ramped entrance      Home Layout: One level     Bathroom Shower/Tub: Tub/shower unit;Curtain   Bathroom Toilet: Handicapped height         Additional Comments: Lives with her husband at home.  Has hospital bed, hoyer lift and manual w/c at home.      Prior Functioning/Environment Level of Independence: Needs assistance  Gait / Transfers Assistance Needed: Pt reports not walking since sustaining thoracic spine fx and L UE fx August 31st 2018; did go to rehab (was involved with [facility] until she left rehab).  Has been hoyer lift dependent since she left rehab (pt unable to state date she left rehab) but does report she can balance sitting up on her own but requires assist to get into that position.  Pt reports she requires assist for bed mobility (  including logrolling in bed for using bed pan).  Husband pushes pt in w/c (pt reports w/c too big for doorways in home though). ADL's / Homemaking Assistance Needed: Patient reports her husband provides all of her care, she reports she can feed herself here at the hospital but he feeds her at home, he performs a sponge bath, she cannot transfer to [facility] and used to use a bedpan however she reports this is too difficult for her husband who has back issues/pain therefore he puts a pad underneath her and changes it.  She reports her husband will get her up via hoyer lift every other day and she stays in bed on the off days.  she will sit up in the wheelchair for 3-4 hours at a time.  She reports she has worn a left arm splint since Aug 31st of last year after a humerus fracture and has limited follow up regarding the arm.  She reports she takes it out of the brace about once a month to stretch her arm out.  She is unable to make a fist or use the left hand functionally at this point.  She reports she primarily washes her face and brushes her teeth.     Comments: Patient reports the bedrail on her bed fell off about 3 weeks ago and she fell off the bed onto the railing onto her left  side.  Since that time she has had pain in her left arm.          OT Problem List: Decreased strength;Decreased activity tolerance;Decreased knowledge of use of DME or AE;Impaired UE functional use;Decreased range of motion;Impaired balance (sitting and/or standing);Decreased coordination;Pain      OT Treatment/Interventions: Self-care/ADL training;DME and/or AE instruction;Therapeutic activities;Therapeutic exercise;Neuromuscular education;Patient/family education;Manual therapy    OT Goals(Current goals can be found in the care plan section) Acute Rehab OT Goals Patient Stated Goal: patient reports she wants to go home, do as much as she did before OT Goal Formulation: With patient Time For Goal Achievement: 01/12/18 Potential to Achieve Goals: Good ADL Goals Pt Will Perform Eating: with modified independence Pt Will Perform Upper Body Bathing: with min assist  OT Frequency: Min 1X/week   Barriers to D/C:            Co-evaluation              AM-PAC PT "6 Clicks" Daily Activity     Outcome Measure Help from another person eating meals?: A Little Help from another person taking care of personal grooming?: A Little Help from another person toileting, which includes using toliet, bedpan, or urinal?: Total Help from another person bathing (including washing, rinsing, drying)?: Total Help from another person to put on and taking off regular upper body clothing?: A Lot Help from another person to put on and taking off regular lower body clothing?: Total 6 Click Score: 11   End of Session    Activity Tolerance: Treatment limited secondary to medical complications (Comment) Patient left: in bed;with call bell/phone within reach;with bed alarm set  OT Visit Diagnosis: Muscle weakness (generalized) (M62.81);History of falling (Z91.81);Feeding difficulties (R63.3);Pain Pain - Right/Left: Left Pain - part of body: Arm                Time: 1829-9371 OT Time Calculation (min):  19 min Charges:  OT General Charges $OT Visit: 1 Visit OT Evaluation $OT Eval Moderate Complexity: 1 Mod G-Codes: OT G-codes **NOT FOR INPATIENT CLASS** Functional Assessment Tool Used: AM-PAC 6  Clicks Daily Activity   Sara Pierce, OTR/L, CLT   Sara Pierce 01/04/2018, 4:26 PM

## 2018-01-05 ENCOUNTER — Inpatient Hospital Stay: Payer: Medicare Other

## 2018-01-05 DIAGNOSIS — J441 Chronic obstructive pulmonary disease with (acute) exacerbation: Secondary | ICD-10-CM | POA: Diagnosis not present

## 2018-01-05 LAB — CBC
HCT: 33.1 % — ABNORMAL LOW (ref 35.0–47.0)
HEMOGLOBIN: 11.1 g/dL — AB (ref 12.0–16.0)
MCH: 30.6 pg (ref 26.0–34.0)
MCHC: 33.4 g/dL (ref 32.0–36.0)
MCV: 91.5 fL (ref 80.0–100.0)
PLATELETS: 250 10*3/uL (ref 150–440)
RBC: 3.62 MIL/uL — ABNORMAL LOW (ref 3.80–5.20)
RDW: 15.5 % — AB (ref 11.5–14.5)
WBC: 6.3 10*3/uL (ref 3.6–11.0)

## 2018-01-05 LAB — GLUCOSE, CAPILLARY
GLUCOSE-CAPILLARY: 111 mg/dL — AB (ref 65–99)
GLUCOSE-CAPILLARY: 109 mg/dL — AB (ref 65–99)

## 2018-01-05 LAB — BASIC METABOLIC PANEL
ANION GAP: 9 (ref 5–15)
BUN: 20 mg/dL (ref 6–20)
CALCIUM: 9.8 mg/dL (ref 8.9–10.3)
CO2: 32 mmol/L (ref 22–32)
CREATININE: 0.71 mg/dL (ref 0.44–1.00)
Chloride: 95 mmol/L — ABNORMAL LOW (ref 101–111)
GFR calc Af Amer: >60 mL/min (ref >60)
GLUCOSE: 114 mg/dL — AB (ref 65–99)
Potassium: 3.4 mmol/L — ABNORMAL LOW (ref 3.5–5.1)
Sodium: 136 mmol/L (ref 135–145)

## 2018-01-05 MED ORDER — PREDNISONE 10 MG PO TABS: ORAL_TABLET | ORAL | 0 refills | Status: DC

## 2018-01-05 MED ORDER — ENSURE ENLIVE PO LIQD
237.0000 mL | Freq: Three times a day (TID) | ORAL | Status: DC
  Administered 2018-01-05: 237 mL via ORAL

## 2018-01-05 MED ORDER — ADULT MULTIVITAMIN W/MINERALS CH: 1.0000 | ORAL_TABLET | Freq: Every day | ORAL | 0 refills | Status: DC

## 2018-01-05 MED ORDER — LEVOFLOXACIN 500 MG PO TABS: 500.0000 mg | ORAL_TABLET | Freq: Every day | ORAL | Status: DC

## 2018-01-05 MED ORDER — PREDNISONE 50 MG PO TABS
50.0000 mg | ORAL_TABLET | Freq: Every day | ORAL | Status: DC
Start: 2018-01-06 — End: 2018-01-05

## 2018-01-05 MED ORDER — ADULT MULTIVITAMIN W/MINERALS CH: 1.0000 | ORAL_TABLET | Freq: Every day | ORAL | Status: DC

## 2018-01-05 MED ORDER — LEVOFLOXACIN 500 MG PO TABS: 500.0000 mg | ORAL_TABLET | Freq: Every day | ORAL | 0 refills | Status: DC

## 2018-01-05 NOTE — Care Management Note (Signed)
Case Management Note  Patient Details  Name: BARBETTE MCGLAUN MRN: [id number] Date of Birth: February 02, 1941  Subjective/Objective: Admitted to [facility] with the diagnosis of COPD. Lives with husband. Sees Dr. Marygrace Drought at [facility] in [place].  Followed by [facility] for skilled nursing, physical therapy and social worker. Will add nursing assistant.                  Action/Plan: Resumption of orders per [place], will add nursing assistant.  Requested [facility] for transport   Expected Discharge Date:                  Expected Discharge Plan:     In-House Referral:   yes  Discharge planning Services   yes  Post Acute Care Choice:    Choice offered to:     DME Arranged:    DME Agency:     HH Arranged:   yes HH Agency:   [facility]   Status of Service:     If discussed at [place] of Stay Meetings, dates discussed:    Additional Comments:  Shelbie Ammons, RN MSN CCM Care Management [phone] 01/05/2018, 1:19 PM

## 2018-01-05 NOTE — Discharge Summary (Signed)
[facility] NAME: Sara Pierce    MR#:  [id number]  DATE OF BIRTH:  1941/07/31  DATE OF ADMISSION:  01/04/2018 ADMITTING PHYSICIAN: Bettey Costa, MD  DATE OF DISCHARGE: 01/05/2018  PRIMARY CARE PHYSICIAN: Marygrace Drought, MD    ADMISSION DIAGNOSIS:  COPD exacerbation ([place]) [J44.1] Dyspnea, unspecified type [R06.00]  DISCHARGE DIAGNOSIS:  Acute on chronic COPD exacerbation--- patient is at baseline. Sats 96% on 3 L nasal cannula oxygen. Acute bronchitis  SECONDARY DIAGNOSIS:   Past Medical History:  Diagnosis Date  . Arthritis   . Cardiomyopathy ([place])   . Cataract   . Cerebral hemorrhage ([place])   . CHF (congestive heart failure) ([place])   . Chronic kidney disease   . Clotting disorder ([place])   . COPD (chronic obstructive pulmonary disease) ([place])   . Depression   . Diabetes mellitus without complication ([place])   . Hypertension   . Mixed incontinence   . Obesity   . Sleep apnea   . Thyroid disease   . Urinary frequency   . Vaginal atrophy   . Varicose veins     HOSPITAL COURSE:   77 year old mody the obese female with history of chronic hypoxic respiratory failure on 3 L of oxygen due to COPD, OSA not compliant with CPAP and chronic diastolic heart failure with preserved ejection fraction who presented shortness of breath.  1. Acute COPD exacerbation with chronic hypoxic respiratory failure: Patient is on baseline 3 L oxygen Continue IV steroid-- changed to oral steroids Continue nebulizer and inhalers -Patient not wheezing. She appears to be at baseline with 3 L nasal Oxygen  2. Acute bronchitis - Continue Levaquin for 5 days. Chest x-ray no evidence of acute infiltrate  3. Chronic diastolic heart failure without signs of exacerbation  4. History of left humerus ORIF and compression fracture: OT and PT consultation requested X-ray left shoulder shows healed old surgical left humerus fracture. With some  deformity. No new acute bony abnormality  5. Hypothyroidism: Continue Cytomel  6. Hyperlipidemia: Continue statin  7. Chronic kidney disease stage III: Monitor creatinine  8. Diabetes: Diet controlled  Start SS  9. OSA: Patient not compliant with with CPAP. She uses nocturnal oxygen  Patient will follow be followed up by home health PT and RN and social worker and aide Discussed with husban. CONSULTS OBTAINED:    DRUG ALLERGIES:   Allergies  Allergen Reactions  . Aspirin Swelling  . Codeine Itching  . Naprosyn [Naproxen] Other (See Comments)    Per MAR pt allergic  . Other     Elastic Bandages/ Supports Per MAR  . Tape Hives  . Valacyclovir     DISCHARGE MEDICATIONS:   Allergies as of 01/05/2018      Reactions   Aspirin Swelling   Codeine Itching   Naprosyn [naproxen] Other (See Comments)   Per MAR pt allergic   Other    Elastic Bandages/ Supports Per MAR   Tape Hives   Valacyclovir       Medication List    STOP taking these medications   docusate sodium 100 MG capsule Commonly known as:  COLACE   vancomycin 1,250 mg in sodium chloride 0.9 % 250 mL     TAKE these medications   albuterol (2.5 MG/3ML) 0.083% nebulizer solution Commonly known as:  PROVENTIL Take 2.5 mg by nebulization every 4 (four) hours as needed for wheezing or shortness of breath. What changed:  Another medication with the  same name was removed. Continue taking this medication, and follow the directions you see here.   ANORO ELLIPTA 62.5-25 MCG/INH Aepb Generic drug:  umeclidinium-vilanterol Inhale 1 puff into the lungs daily.   budesonide 0.5 MG/2ML nebulizer solution Commonly known as:  PULMICORT Take 2 mLs (0.5 mg total) 2 (two) times daily by nebulization.   buPROPion 150 MG 24 hr tablet Commonly known as:  WELLBUTRIN XL Take 150 mg by mouth daily.   ferrous sulfate 325 (65 FE) MG tablet Take 1 tablet (325 mg total) 2 (two) times daily with a meal by mouth.    fluticasone 50 MCG/ACT nasal spray Commonly known as:  FLONASE Place 2 sprays into both nostrils daily.   guaiFENesin-dextromethorphan 100-10 MG/5ML syrup Commonly known as:  ROBITUSSIN DM Take 10 mLs by mouth every 6 (six) hours as needed for cough.   hydrOXYzine 25 MG tablet Commonly known as:  ATARAX/VISTARIL Take 25 mg by mouth 3 (three) times daily as needed.   ipratropium-albuterol 0.5-2.5 (3) MG/3ML Soln Commonly known as:  DUONEB Inhale 3 mLs into the lungs every 6 (six) hours as needed (wheezing).   levofloxacin 500 MG tablet Commonly known as:  LEVAQUIN Take 1 tablet (500 mg total) by mouth daily. Start taking on:  01/06/2018   liothyronine 25 MCG tablet Commonly known as:  CYTOMEL Take 25 mcg by mouth daily.   magnesium oxide 400 MG tablet Commonly known as:  MAG-OX Take 400 mg by mouth daily.   montelukast 10 MG tablet Commonly known as:  SINGULAIR Take 10 mg by mouth at bedtime.   multivitamin with minerals Tabs tablet Take 1 tablet by mouth daily. Start taking on:  01/06/2018   naloxone 4 MG/0.1ML Liqd nasal spray kit Commonly known as:  NARCAN Place 1 spray into the nose. Repeat every 3 minutes if no response.   nystatin cream Commonly known as:  MYCOSTATIN Apply 1 application topically 2 (two) times daily.   Oxycodone HCl 10 MG Tabs Take 10 mg every 6 (six) hours as needed by mouth for moderate pain, severe pain or breakthrough pain.   potassium chloride 10 MEQ tablet Commonly known as:  K-DUR Take 10 mEq by mouth daily.   predniSONE 10 MG tablet Commonly known as:  DELTASONE Take 50 mg daily taper by 10 mg daily than stop Start taking on:  01/06/2018 What changed:    medication strength  how much to take  how to take this  when to take this  additional instructions   primidone 50 MG tablet Commonly known as:  MYSOLINE Take 50 mg by mouth 2 (two) times daily.   senna-docusate 8.6-50 MG tablet Commonly known as:  Senokot-S Take 2  tablets by mouth daily.   simvastatin 40 MG tablet Commonly known as:  ZOCOR Take 40 mg by mouth at bedtime.   sodium chloride 0.65 % Soln nasal spray Commonly known as:  OCEAN Place 2 sprays into both nostrils 3 (three) times daily as needed for congestion.   tiZANidine 4 MG tablet Commonly known as:  ZANAFLEX Take 4 mg by mouth every 12 (twelve) hours as needed for muscle spasms.       If you experience worsening of your admission symptoms, develop shortness of breath, life threatening emergency, suicidal or homicidal thoughts you must seek medical attention immediately by calling 911 or calling your MD immediately  if symptoms less severe.  You Must read complete instructions/literature along with all the possible adverse reactions/side effects for all the Medicines  you take and that have been prescribed to you. Take any new Medicines after you have completely understood and accept all the possible adverse reactions/side effects.   Please note  You were cared for by a hospitalist during your hospital stay. If you have any questions about your discharge medications or the care you received while you were in the hospital after you are discharged, you can call the unit and asked to speak with the hospitalist on call if the hospitalist that took care of you is not available. Once you are discharged, your primary care physician will handle any further medical issues. Please note that NO REFILLS for any discharge medications will be authorized once you are discharged, as it is imperative that you return to your primary care physician (or establish a relationship with a primary care physician if you do not have one) for your aftercare needs so that they can reassess your need for medications and monitor your lab values. Today   SUBJECTIVE   Came in with increasing shortness of breath and some cough. Patient is bedbound. Husband helps her at home. She uses chronic home oxygen sats are 96% on  3 L. Denies any new complaints other than some cough.  VITAL SIGNS:  Blood pressure (!) 111/48, pulse 77, temperature 98.2 F (36.8 C), temperature source Oral, resp. rate (!) 24, height '5\' 5"'$  (1.651 m), weight 97.7 kg (215 lb 4.8 oz), SpO2 96 %.  I/O:    Intake/Output Summary (Last 24 hours) at 01/05/2018 1515 Last data filed at 01/05/2018 1300 Gross per 24 hour  Intake 480 ml  Output 250 ml  Net 230 ml    PHYSICAL EXAMINATION:  GENERAL:  77 y.o.-year-old patient lying in the bed with no acute distress. Morbid obesity chronically ill EYES: Pupils equal, round, reactive to light and accommodation. No scleral icterus. Extraocular muscles intact.  HEENT: Head atraumatic, normocephalic. Oropharynx and nasopharynx clear.  NECK:  Supple, no jugular venous distention. No thyroid enlargement, no tenderness.  LUNGS: Normal breath sounds bilaterally, no wheezing, rales,rhonchi or crepitation. No use of accessory muscles of respiration.  CARDIOVASCULAR: S1, S2 normal. No murmurs, rubs, or gallops.  ABDOMEN: Soft, non-tender, non-distended. Bowel sounds present. No organomegaly or mass.  EXTREMITIES: No pedal edema, cyanosis, or clubbing. Left shoulder sling present NEUROLOGIC: Limited exam. Patient has weak left upper and lower extremity which is chronic  PSYCHIATRIC: The patient is alert and oriented x 3.  SKIN: No obvious rash, lesion, or ulcer.   DATA REVIEW:   CBC  Recent Labs  Lab 01/05/18 0634  WBC 6.3  HGB 11.1*  HCT 33.1*  PLT 250    Chemistries  Recent Labs  Lab 01/04/18 0838 01/05/18 0634  NA 137 136  K 3.5 3.4*  CL 95* 95*  CO2 30 32  GLUCOSE 117* 114*  BUN 15 20  CREATININE 0.79 0.71  CALCIUM 10.0 9.8  AST 16  --   ALT 8*  --   ALKPHOS 73  --   BILITOT 0.9  --     Microbiology Results   Recent Results (from the past 240 hour(s))  Culture, expectorated sputum-assessment     Status: None   Collection Time: 01/04/18 11:03 AM  Result Value Ref Range  Status   Specimen Description SPUTUM  Final   Special Requests Normal  Final   Sputum evaluation   Final    THIS SPECIMEN IS ACCEPTABLE FOR SPUTUM CULTURE Performed at [facility], [address] [place],  [place] [address]    Report Status 01/04/2018 FINAL  Final  Culture, respiratory (NON-Expectorated)     Status: None (Preliminary result)   Collection Time: 01/04/18 11:03 AM  Result Value Ref Range Status   Specimen Description   Final    SPUTUM Performed at [facility], [address] [place], [place] [address]    Special Requests   Final    Normal Reflexed from [id number] Performed at [facility], [address] [place], [place] [address]    Gram Stain   Final    RARE WBC PRESENT, PREDOMINANTLY MONONUCLEAR NO ORGANISMS SEEN    Culture   Final    CULTURE REINCUBATED FOR BETTER GROWTH Performed at [facility], [place] [address], [place],  [address]    Report Status PENDING  Incomplete    RADIOLOGY:  Dg Chest 1 View  Result Date: 01/05/2018 CLINICAL DATA:  Followup pneumonia EXAM: CHEST 1 VIEW COMPARISON:  01/04/2018 FINDINGS: Heart size is normal. Aortic atherosclerosis. Right lung is clear. Patchy infiltrate and volume loss in the left lower lobe is unchanged. No worsening or new finding. IMPRESSION: Patchy infiltrate and volume loss in the left lower lobe. Electronically Signed   By: Nelson Chimes M.D.   On: 01/05/2018 07:46   Dg Chest Port 1 View  Result Date: 01/04/2018 CLINICAL DATA:  Dyspnea EXAM: PORTABLE CHEST 1 VIEW COMPARISON:  12/25/2017 FINDINGS: Chronic cardiomegaly and main pulmonary artery enlargement. Large lung volumes. Patient has history of COPD. There is a linear density over the right mid lung that is stable. This correlates with a scar-like appearance on a December 2018 chest CT. There is no edema, consolidation, effusion, or pneumothorax. Remote left humeral neck fracture. IMPRESSION: 1. No acute finding when  compared to prior. 2. Cardiomegaly and pulmonary artery enlargement/hypertension. 3. COPD. Electronically Signed   By: Monte Fantasia M.D.   On: 01/04/2018 08:54   Dg Shoulder Left  Result Date: 01/05/2018 CLINICAL DATA:  Left shoulder pain since falling out of bed. EXAM: LEFT SHOULDER - 2+ VIEW COMPARISON:  Left shoulder series of December 14, 2017 FINDINGS: The images are limited due to scatter effects from overlying soft tissues. There is chronic deformity of the left humeral neck. No acute fracture or dislocation is observed. There are degenerative changes of the [facility] joint. IMPRESSION: Old left surgical neck fracture which has healed with deformity. No acute bony abnormality. Electronically Signed   By: David  Martinique M.D.   On: 01/05/2018 14:36     Management plans discussed with the patient, family and they are in agreement.  CODE STATUS:     Code Status Orders  (From admission, onward)        Start     Ordered   01/04/18 1228  Full code  Continuous     01/04/18 1228    Code Status History    Date Active Date Inactive Code Status Order ID Comments User Context   10/24/2017 05:23 10/28/2017 16:01 Full Code [id number]  Saundra Shelling, MD Inpatient   08/20/2017 01:34 08/21/2017 18:28 Full Code [id number]  Lance Coon, MD Inpatient   10/03/2016 05:40 10/04/2016 17:11 Full Code [id number]  Harrie Foreman, MD Inpatient   11/15/2015 22:23 11/19/2015 15:14 Full Code [id number]  Hower, Aaron Mose, MD ED    Advance Directive Documentation     Most Recent Value  Type of Advance Directive  Healthcare Power of Attorney, Living will  Pre-existing out of facility DNR order (yellow form or  pink MOST form)  No data  "MOST" Form in Place?  No data      TOTAL TIME TAKING CARE OF THIS PATIENT: *40* minutes.    Fritzi Mandes M.D on 01/05/2018 at 3:15 PM  Between 7am to 6pm - Pager - [phone] After 6pm go to [email or web address] - password [email or web address] [facility]  Office   [phone]  CC: Primary care physician; Marygrace Drought, MD

## 2018-01-05 NOTE — Progress Notes (Signed)
Patient discharged home via EMS. Husband aware. IV removed. Discharge instructions reviewed with patient. No questions at this time.

## 2018-01-05 NOTE — NC FL2 (Signed)
[place] LEVEL OF CARE SCREENING TOOL     IDENTIFICATION  Patient Name: Sara Pierce Birthdate: 1941/10/31 Sex: female Admission Date (Current Location): 01/04/2018  [facility] and [place] Number:  Sara Pierce ([id number]) Facility and Address:  [facility], [address], [place],  [address]      Provider Number: [id number]  Attending Physician Name and Address:  Fritzi Mandes, MD  Relative Name and Phone Number:       Current Level of Care: Hospital Recommended Level of Care: [place] Prior Approval Number:    Date Approved/Denied:   PASRR Number: ( [id number] )  Discharge Plan: SNF    Current Diagnoses: Patient Active Problem List   Diagnosis Date Noted  . COPD (chronic obstructive pulmonary disease) ([place]) 01/04/2018  . Pressure injury of skin 10/25/2017  . COPD with exacerbation ([place]) 10/24/2017  . HCAP (healthcare-associated pneumonia) 08/19/2017  . CAP (community acquired pneumonia) 10/03/2016  . Sepsis ([place]) 11/15/2015  . Absolute anemia 03/15/2015  . Anxiety 03/15/2015  . Arthritis 03/15/2015  . Cardiomyopathy, secondary ([place]) 03/15/2015  . Cataract 03/15/2015  . Brain bleed ([place]) 03/15/2015  . Chronic constipation 03/15/2015  . Chronic kidney failure 03/15/2015  . Blood clotting disorder ([place]) 03/15/2015  . CAFL (chronic airflow limitation) ([place]) 03/15/2015  . Clinical depression 03/15/2015  . Diabetes ([place]) 03/15/2015  . H/O varicella 03/15/2015  . BP (high blood pressure) 03/15/2015  . Mixed incontinence 03/15/2015  . 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 Adiposity 03/15/2015  . Apnea, sleep 03/15/2015  . Disease of thyroid gland 03/15/2015  . FOM (frequency of micturition) 03/15/2015  . 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 Candida vaginitis 03/15/2015  . Abnormal ECG 03/15/2015    Orientation RESPIRATION BLADDER Height & Weight     Self, Time,  Situation, Place  O2(2L Nasal Cannula) Incontinent Weight: 215 lb 4.8 oz (97.7 kg) Height:  5\' 5"  (165.1 cm)  BEHAVIORAL SYMPTOMS/MOOD NEUROLOGICAL BOWEL NUTRITION STATUS      Continent Diet(Dysphagia 3)  AMBULATORY STATUS COMMUNICATION OF NEEDS Skin   Extensive Assist Verbally Bruising(Pressure Injury Left Arm, Coccyx)                       Personal Care Assistance Level of Assistance  Bathing, Feeding, Dressing Bathing Assistance: Limited assistance Feeding assistance: Independent Dressing Assistance: Limited assistance     Functional Limitations Info  Sight, Hearing, Speech Sight Info: Adequate Hearing Info: Adequate Speech Info: Adequate    SPECIAL CARE FACTORS FREQUENCY  PT (By licensed PT), OT (By licensed OT)     PT Frequency: (5) OT Frequency: (5)            Contractures      Additional Factors Info  Code Status, Allergies, Isolation Precautions Code Status Info: (Full Code) Allergies Info: (ASPIRIN, CODEINE, NAPROSYN NAPROXEN, OTHER, TAPE, VALACYCLOVIR)     Isolation Precautions Info: (Nasal Swab)     Current Medications (01/05/2018):  This is the current hospital active medication list Current Facility-Administered Medications  Medication Dose Route Frequency Provider Last Rate Last Dose  . acetaminophen (TYLENOL) tablet 650 mg  650 mg Oral Q6H PRN Bettey Costa, MD       Or  . acetaminophen (TYLENOL) suppository 650 mg  650 mg Rectal Q6H PRN Mody, Sital, MD      . budesonide (PULMICORT) nebulizer solution 0.5 mg  0.5 mg Nebulization BID Bettey Costa, MD   0.5 mg at  01/05/18 0731  . buPROPion (WELLBUTRIN XL) 24 hr tablet 150 mg  150 mg Oral Daily Bettey Costa, MD   150 mg at 01/05/18 0910  . cyclobenzaprine (FLEXERIL) tablet 5 mg  5 mg Oral TID PRN Bettey Costa, MD   5 mg at 01/04/18 1633  . enoxaparin (LOVENOX) injection 40 mg  40 mg Subcutaneous Q24H Bettey Costa, MD   40 mg at 01/04/18 2230  . feeding supplement (ENSURE ENLIVE) (ENSURE ENLIVE) liquid  237 mL  237 mL Oral TID WC Fritzi Mandes, MD      . ferrous sulfate tablet 325 mg  325 mg Oral BID WC Bettey Costa, MD   325 mg at 01/05/18 0909  . guaiFENesin-dextromethorphan (ROBITUSSIN DM) 100-10 MG/5ML syrup 10 mL  10 mL Oral Q6H PRN Bettey Costa, MD   10 mL at 01/04/18 1447  . insulin aspart (novoLOG) injection 0-20 Units  0-20 Units Subcutaneous TID WC Bettey Costa, MD   3 Units at 01/04/18 1837  . ipratropium-albuterol (DUONEB) 0.5-2.5 (3) MG/3ML nebulizer solution 3 mL  3 mL Inhalation Q6H PRN Bettey Costa, MD   3 mL at 01/04/18 2000  . ketorolac (TORADOL) 15 MG/ML injection 15 mg  15 mg Intravenous Q6H PRN Bettey Costa, MD   15 mg at 01/05/18 1005  . levofloxacin (LEVAQUIN) IVPB 750 mg  750 mg Intravenous Q24H Bettey Costa, MD   Stopped at 01/05/18 1040  . liothyronine (CYTOMEL) tablet 25 mcg  25 mcg Oral Daily Bettey Costa, MD   25 mcg at 01/05/18 0910  . methylPREDNISolone sodium succinate (SOLU-MEDROL) 40 mg/mL injection 40 mg  40 mg Intravenous Q12H Bettey Costa, MD   40 mg at 01/05/18 0503  . montelukast (SINGULAIR) tablet 10 mg  10 mg Oral QHS Bettey Costa, MD   10 mg at 01/04/18 2230  . [START ON 01/06/2018] multivitamin with minerals tablet 1 tablet  1 tablet Oral Daily Fritzi Mandes, MD      . ondansetron [facility]) tablet 4 mg  4 mg Oral Q6H PRN Bettey Costa, MD       Or  . ondansetron (ZOFRAN) injection 4 mg  4 mg Intravenous Q6H PRN Mody, Sital, MD      . oxyCODONE (Oxy IR/ROXICODONE) immediate release tablet 10 mg  10 mg Oral Q6H PRN Bettey Costa, MD   10 mg at 01/04/18 1837  . primidone (MYSOLINE) tablet 50 mg  50 mg Oral BID Bettey Costa, MD   50 mg at 01/05/18 0910  . senna-docusate (Senokot-S) tablet 1 tablet  1 tablet Oral QHS PRN Mody, Sital, MD      . simvastatin (ZOCOR) tablet 40 mg  40 mg Oral QHS Mody, Sital, MD   40 mg at 01/04/18 2230  . umeclidinium-vilanterol (ANORO ELLIPTA) 62.5-25 MCG/INH 1 puff  1 puff Inhalation Daily Bettey Costa, MD   1 puff at 01/05/18 0910     Discharge  Medications: Please see discharge summary for a list of discharge medications.  Relevant Imaging Results:  Relevant Lab Results:   Additional Information (SSN: [id number])  Smith Mince, Student-Social Work

## 2018-01-05 NOTE — Progress Notes (Signed)
[facility]  Patient Status: Active  [facility] is providing the following services: SN/PT/SW  If patient discharges after hours, please call [phone].   Sara Pierce 01/05/2018, 9:13 AM

## 2018-01-05 NOTE — Progress Notes (Signed)
Consult: APS involvement and APS wanting placement  Per recommendations of PT:  pt very limited in participation with therapy, only able to tolerate sliding up in bed with +2 assist. Pt unable to assist in transfer. At baseline, pt is hoyer dependent, however only gets out of bed every other day due according to patient. Apparent social issues noted in previous evals. Per pt, HHPT to begin on Friday. Pt may ultimately need increased level of medical care or supervision in LTC setting vs home. Not appropriate for SNF level at this time. Pt is at baseline level this date, however may benefit from trial PT while in hospital to improve functional mobility with rolling or sitting balance.  Patient would have to use Medicaid check in effort to have benefit for SNF due to not meeting for skilled needs for Medicare to pay.  She is no longer part of [facility] program.  LCSW met with patient at bedside. Patient is alert and oriented x4, able to explain reason for admission and understands reason for consult and involvement. Patient reports she is not going to a nursing home. She reports " I know APS worker wants me to go, but I am not leaving my home nor am I leaving my husband who takes care of me, she needs to get off my back".  Patient reports " my husband cleans me, bathes me, brushes my teeth, carries me to my appointments, and is not drinking."  He will be home with me and get milk at the store and even a small beer if I want to drink one".  Patient reports her main problem was what she did laying in the bed.  "I was laying with my head down and feet up to help with the pain and swelling.  I thought I was helping myself but when I would urinate it would go down my back and that was my biggest problem, my husband did nothing wrong."  Patient is clear in mind at this time reporting she will not use her medicaid to pay for placement and does not want to go. She voices she will go home and resume home health. She  reports she has a sister in law coming to stay with her starting 2/5 and her son is also involve and will be there to help out.  CM made aware of patient wishes and plans. CSW will sign off at this time as services have been refused by patient.  Lane Hacker, MSW Clinical Social Work: [employer] Coverage for : [phone]

## 2018-01-05 NOTE — Progress Notes (Signed)
Initial Nutrition Assessment  DOCUMENTATION CODES:   Obesity unspecified  INTERVENTION:  Will downgrade diet to dysphagia 3 with thin liquids in setting of poor dentition and difficulty chewing.  Provide Ensure Enlive po TID with meals, each supplement provides 350 kcal and 20 grams of protein. Patient prefers strawberry.  Provide daily MVI.  NUTRITION DIAGNOSIS:   Inadequate oral intake related to poor appetite as evidenced by per patient/family report, 10.9 percent weight loss over 2 months.  GOAL:   Patient will meet greater than or equal to 90% of their needs  MONITOR:   PO intake, Supplement acceptance, Labs, Weight trends, Skin, I & O's  REASON FOR ASSESSMENT:   Malnutrition Screening Tool    ASSESSMENT:   77 year old female with PMHx of COPD on 3 L oxygen at baseline, CKD, depression, cardiomyopathy, DM type 2, HTN, CHF with preserved ejection fraction now admitted with acute exacerbation of COPD, presumed PNA, also s/p ORIF of left humerus and recently found to have compression fracture of thoracic vertebrae.   Met with patient at bedside. She is known to this RD from previous admission. At that time she presented from [facility] and had not liked the food there and was having difficulty feeding herself. Patient is now coming from home and her husband has been helping her at home. She reports she now has no appetite and is not interested in food. Her husband tries to get her to eat 3 good meals per day, but often times she only wants to eat an applesauce or a gelatin for her meal. She reports she knows the importance of eating enough, especially protein. She enjoys Ensure and reports it has helped her get her appetite back in the past. Strawberry is her favorite flavor. She has some difficulty chewing and needs soft foods. She has partial dentures but they are at home.  UBW was 250 lbs. Patient was 241.7 lbs on 11/01/2017. She has now lost 26.4 lbs (10.9% body weight)  over 2 months, which is significant for time frame. None of patient's recent admissions have been for CHF/volume overload, so most of that weight loss was likely true weight loss.  Medications reviewed and include: ferrous sulfate 325 mg BID, Novolog 0-20 units TID, Solu-Medrol 40 mg Q12hrs IV, Levaquin.  Labs reviewed: CBG 111-136, Potassium 3.4, Chloride 95.  RD suspects malnutrition in setting of severe weight loss and poor appetite. However, unable to quantify exactly how much patient has been eating due to limited details provided on food recall, and NFPE not very revealing in setting of body habitus.  NUTRITION - FOCUSED PHYSICAL EXAM:    Most Recent Value  Orbital Region  No depletion  Upper Arm Region  Mild depletion  Thoracic and Lumbar Region  No depletion  Buccal Region  No depletion  Temple Region  Mild depletion  Clavicle Bone Region  No depletion  Clavicle and Acromion Bone Region  No depletion  Scapular Bone Region  No depletion  Dorsal Hand  No depletion  Patellar Region  No depletion  Anterior Thigh Region  No depletion  Posterior Calf Region  No depletion  Edema (RD Assessment)  None  Hair  Reviewed  Eyes  Reviewed  Mouth  Reviewed [poor dentition]  Skin  Reviewed  Nails  Reviewed     Diet Order:  Diet Heart Room service appropriate? Yes; Fluid consistency: Thin  EDUCATION NEEDS:   No education needs have been identified at this time  Skin:  Skin  Assessment: Skin Integrity Issues: Skin Integrity Issues:: Stage II, Other (Comment) Stage II: coccyx and left arm Other: ecchymosis to legs and arms; MSAD to back, breast, and groin  Last BM:  01/04/2018  Height:   Ht Readings from Last 1 Encounters:  01/04/18 _0  (1.651 m)    Weight:   Wt Readings from Last 1 Encounters:  01/04/18 215 lb 4.8 oz (97.7 kg)    Ideal Body Weight:  56.8 kg  BMI:  Body mass index is 35.83 kg/m.  Estimated Nutritional Needs:   Kcal:  2438-3654 (MSJ x  1.2-1.4)  Protein:  100-115 grams (1-1.2 grams/kg)  Fluid:  1.4 L/day (25 mL/kg IBW)  Willey Blade, MS, RD, LDN Office: [phone] Pager: [phone] After Hours/Weekend Pager: [phone]

## 2018-01-05 NOTE — Plan of Care (Signed)
  Progressing Education: Knowledge of General Education information will improve 01/05/2018 0645 - Progressing by Rolland Bimler, [place] Behavior/Discharge Planning: Ability to manage health-related needs will improve 01/05/2018 0645 - Progressing by Rolland Bimler, RN Clinical Measurements: Ability to maintain clinical measurements within normal limits will improve 01/05/2018 0645 - Progressing by Rolland Bimler, RN Will remain free from infection 01/05/2018 0645 - Progressing by Rolland Bimler, RN Diagnostic test results will improve 01/05/2018 0645 - Progressing by Rolland Bimler, RN Respiratory complications will improve 01/05/2018 0645 - Progressing by Rolland Bimler, RN Cardiovascular complication will be avoided 01/05/2018 0645 - Progressing by Rolland Bimler, RN Activity: Risk for activity intolerance will decrease 01/05/2018 0645 - Progressing by Rolland Bimler, RN Nutrition: Adequate nutrition will be maintained 01/05/2018 0645 - Progressing by Rolland Bimler, RN Coping: Level of anxiety will decrease 01/05/2018 0645 - Progressing by Rolland Bimler, RN Elimination: Will not experience complications related to bowel motility 01/05/2018 0645 - Progressing by Rolland Bimler, RN Will not experience complications related to urinary retention 01/05/2018 0645 - Progressing by Rolland Bimler, RN Pain Managment: General experience of comfort will improve 01/05/2018 0645 - Progressing by Rolland Bimler, RN Safety: Ability to remain free from injury will improve 01/05/2018 0645 - Progressing by Rolland Bimler, RN Skin Integrity: Risk for impaired skin integrity will decrease 01/05/2018 0645 - Progressing by Rolland Bimler, RN Spiritual Needs Ability to function at adequate level 01/05/2018 0645 - Progressing by Rolland Bimler, RN

## 2018-01-07 LAB — CULTURE, RESPIRATORY

## 2018-01-07 LAB — CULTURE, RESPIRATORY W GRAM STAIN
Culture: NORMAL
Special Requests: NORMAL

## 2018-01-24 ENCOUNTER — Other Ambulatory Visit: Payer: Self-pay | Admitting: Student

## 2018-01-24 DIAGNOSIS — S22050D Wedge compression fracture of T5-T6 vertebra, subsequent encounter for fracture with routine healing: Secondary | ICD-10-CM

## 2018-01-31 ENCOUNTER — Emergency Department
Admission: EM | Admit: 2018-01-31 | Discharge: 2018-01-31 | Disposition: A | Discharge status: Home / Self Care | Payer: Medicare Other | Attending: Emergency Medicine | Admitting: Emergency Medicine

## 2018-01-31 ENCOUNTER — Encounter: Payer: Self-pay | Admitting: *Deleted

## 2018-01-31 ENCOUNTER — Emergency Department: Payer: Medicare Other

## 2018-01-31 ENCOUNTER — Other Ambulatory Visit: Payer: Self-pay

## 2018-01-31 DIAGNOSIS — Z87891 Personal history of nicotine dependence: Secondary | ICD-10-CM | POA: Diagnosis not present

## 2018-01-31 DIAGNOSIS — M25562 Pain in left knee: Secondary | ICD-10-CM | POA: Diagnosis present

## 2018-01-31 DIAGNOSIS — J449 Chronic obstructive pulmonary disease, unspecified: Secondary | ICD-10-CM | POA: Insufficient documentation

## 2018-01-31 DIAGNOSIS — I110 Hypertensive heart disease with heart failure: Secondary | ICD-10-CM | POA: Insufficient documentation

## 2018-01-31 DIAGNOSIS — M25512 Pain in left shoulder: Secondary | ICD-10-CM | POA: Diagnosis not present

## 2018-01-31 DIAGNOSIS — I509 Heart failure, unspecified: Secondary | ICD-10-CM | POA: Insufficient documentation

## 2018-01-31 DIAGNOSIS — E119 Type 2 diabetes mellitus without complications: Secondary | ICD-10-CM | POA: Insufficient documentation

## 2018-01-31 LAB — URINALYSIS, COMPLETE (UACMP) WITH MICROSCOPIC
Bacteria, UA: NONE SEEN
Bilirubin Urine: NEGATIVE
Glucose, UA: NEGATIVE mg/dL
Hgb urine dipstick: NEGATIVE
KETONES UR: NEGATIVE mg/dL
Leukocytes, UA: NEGATIVE
Nitrite: NEGATIVE
PH: 5 (ref 5.0–8.0)
Protein, ur: NEGATIVE mg/dL
RBC / HPF: NONE SEEN RBC/hpf (ref 0–5)
Specific Gravity, Urine: 1.019 (ref 1.005–1.030)

## 2018-01-31 MED ORDER — TRAMADOL HCL 50 MG PO TABS
100.0000 mg | ORAL_TABLET | Freq: Once | ORAL | Status: AC
  Administered 2018-01-31: 100 mg via ORAL

## 2018-01-31 MED ORDER — TRAMADOL HCL 50 MG PO TABS
ORAL_TABLET | ORAL | Status: AC
  Administered 2018-01-31: 100 mg via ORAL
  Filled 2018-01-31: qty 2

## 2018-01-31 NOTE — Discharge Instructions (Signed)
Please follow-up with orthopedics by calling the number provided to arrange a follow-up appointment for further evaluation.  Return to the emergency department for any personally concerning symptoms.

## 2018-01-31 NOTE — ED Notes (Signed)
Pt asked this RN to call husband to let him know pt is on her way home by EMS.

## 2018-01-31 NOTE — ED Provider Notes (Signed)
[facility] Emergency Department Provider Note  Time seen: 6:01 PM  I have reviewed the triage vital signs and the nursing notes.   HISTORY  Chief Complaint Knee Pain and Shoulder Pain    HPI Sara Pierce is a 77 y.o. female with a past medical history of CHF, CKD, COPD, diabetes, depression, hypertension, obesity, fall 3 weeks ago with continued left shoulder and left knee pain.  According to the patient she states yesterday she noticed very cloudy urine when urinating.  States she has been having a mild dysuria over the past 1-2 days.  She also states she fell out of her bed 3 weeks ago with left shoulder and left knee pain.  States she has had x-rays performed and was told that she had an old injury but patient denies any old injuries.  Patient states significant pain to the left shoulder and left knee.  Patient states in October she suffered a spinal injury and is now bedbound since October, does not ambulate.  Patient states she came for evaluation of her left knee pain, left shoulder pain and to make sure she did not have a urinary tract infection.  States she had to come by EMS because she does not ambulate at baseline and the husband has no way to transport her.  Patient states she has home health care on a daily basis to help her at home.   Past Medical History:  Diagnosis Date  . Arthritis   . Cardiomyopathy ([place])   . Cataract   . Cerebral hemorrhage ([place])   . CHF (congestive heart failure) ([place])   . Chronic kidney disease   . Clotting disorder ([place])   . COPD (chronic obstructive pulmonary disease) ([place])   . Depression   . Diabetes mellitus without complication ([place])   . Hypertension   . Mixed incontinence   . Obesity   . Sleep apnea   . Thyroid disease   . Urinary frequency   . Vaginal atrophy   . Varicose veins     Patient Active Problem List   Diagnosis Date Noted  . COPD (chronic obstructive pulmonary disease) ([place]) 01/04/2018  .  Pressure injury of skin 10/25/2017  . COPD with exacerbation ([place]) 10/24/2017  . HCAP (healthcare-associated pneumonia) 08/19/2017  . CAP (community acquired pneumonia) 10/03/2016  . Sepsis ([place]) 11/15/2015  . Absolute anemia 03/15/2015  . Anxiety 03/15/2015  . Arthritis 03/15/2015  . Cardiomyopathy, secondary ([place]) 03/15/2015  . Cataract 03/15/2015  . Brain bleed ([place]) 03/15/2015  . Chronic constipation 03/15/2015  . Chronic kidney failure 03/15/2015  . Blood clotting disorder ([place]) 03/15/2015  . CAFL (chronic airflow limitation) ([place]) 03/15/2015  . Clinical depression 03/15/2015  . Diabetes ([place]) 03/15/2015  . H/O varicella 03/15/2015  . BP (high blood pressure) 03/15/2015  . Mixed incontinence 03/15/2015  . 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 Adiposity 03/15/2015  . Apnea, sleep 03/15/2015  . Disease of thyroid gland 03/15/2015  . FOM (frequency of micturition) 03/15/2015  . 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 Candida vaginitis 03/15/2015  . Abnormal ECG 03/15/2015    Past Surgical History:  Procedure Laterality Date  . APPENDECTOMY    . Cardiac Catherization    . COLONOSCOPY WITH PROPOFOL N/A 04/28/2016   Procedure: COLONOSCOPY WITH PROPOFOL;  Surgeon: Manya Silvas, MD;  Location: [facility] ENDOSCOPY;  Service: Endoscopy;  Laterality: N/A;  . COLONOSCOPY WITH PROPOFOL N/A 04/29/2016   Procedure: COLONOSCOPY WITH PROPOFOL;  Surgeon: Manya Silvas, MD;  Location: [facility] ENDOSCOPY;  Service: Endoscopy;  Laterality: N/A;  . JOINT REPLACEMENT    . STRABISMUS SURGERY    . TONSILLECTOMY    . TUBAL LIGATION      Prior to Admission medications   Medication Sig Start Date End Date Taking? Authorizing Provider  albuterol (PROVENTIL) (2.5 MG/3ML) 0.083% nebulizer solution Take 2.5 mg by nebulization every 4 (four) hours as needed for wheezing or shortness of breath.    [provider]  budesonide (PULMICORT) 0.5 MG/2ML nebulizer  solution Take 2 mLs (0.5 mg total) 2 (two) times daily by nebulization. 10/28/17   Demetrios Loll, MD  buPROPion (WELLBUTRIN XL) 150 MG 24 hr tablet Take 150 mg by mouth daily.    [provider]  ferrous sulfate 325 (65 FE) MG tablet Take 1 tablet (325 mg total) 2 (two) times daily with a meal by mouth. 10/28/17   Demetrios Loll, MD  fluticasone [facility]) 50 MCG/ACT nasal spray Place 2 sprays into both nostrils daily.     [provider]  guaiFENesin-dextromethorphan (ROBITUSSIN DM) 100-10 MG/5ML syrup Take 10 mLs by mouth every 6 (six) hours as needed for cough. Patient not taking: Reported on 10/24/2017 10/04/16   Nicholes Mango, MD  hydrOXYzine (ATARAX/VISTARIL) 25 MG tablet Take 25 mg by mouth 3 (three) times daily as needed.    [provider]  ipratropium-albuterol (DUONEB) 0.5-2.5 (3) MG/3ML SOLN Inhale 3 mLs into the lungs every 6 (six) hours as needed (wheezing).     [provider]  levofloxacin (LEVAQUIN) 500 MG tablet Take 1 tablet (500 mg total) by mouth daily. 01/06/18   Fritzi Mandes, MD  liothyronine (CYTOMEL) 25 MCG tablet Take 25 mcg by mouth daily.     [provider]  magnesium oxide (MAG-OX) 400 MG tablet Take 400 mg by mouth daily.     [provider]  montelukast (SINGULAIR) 10 MG tablet Take 10 mg by mouth at bedtime.     [provider]  Multiple Vitamin (MULTIVITAMIN WITH MINERALS) TABS tablet Take 1 tablet by mouth daily. 01/06/18   Fritzi Mandes, MD  naloxone [facility]) nasal spray 4 mg/0.1 mL Place 1 spray into the nose. Repeat every 3 minutes if no response.    [provider]  nystatin cream (MYCOSTATIN) Apply 1 application topically 2 (two) times daily.    [provider]  Oxycodone HCl 10 MG TABS Take 10 mg every 6 (six) hours as needed by mouth for moderate pain, severe pain or breakthrough pain.     [provider]  potassium chloride (K-DUR) 10 MEQ tablet Take 10 mEq by mouth daily.      [provider]  predniSONE (DELTASONE) 10 MG tablet Take 50 mg daily taper by 10 mg daily than stop 01/06/18   Fritzi Mandes, MD  primidone (MYSOLINE) 50 MG tablet Take 50 mg by mouth 2 (two) times daily.     [provider]  senna-docusate (SENOKOT-S) 8.6-50 MG tablet Take 2 tablets by mouth daily.    [provider]  simvastatin (ZOCOR) 40 MG tablet Take 40 mg by mouth at bedtime.     [provider]  sodium chloride (OCEAN) 0.65 % SOLN nasal spray Place 2 sprays into both nostrils 3 (three) times daily as needed for congestion. Patient not taking: Reported on 10/24/2017 10/04/16   Nicholes Mango, MD  tiZANidine (ZANAFLEX) 4 MG tablet Take 4 mg by mouth every 12 (twelve) hours as needed for  muscle spasms.    [provider]  umeclidinium-vilanterol (ANORO ELLIPTA) 62.5-25 MCG/INH AEPB Inhale 1 puff into the lungs daily.    [provider]    Allergies  Allergen Reactions  . Aspirin Swelling  . Codeine Itching  . Naprosyn [Naproxen] Other (See Comments)    Per MAR pt allergic  . Other     Elastic Bandages/ Supports Per MAR  . Tape Hives  . Valacyclovir     Family History  Problem Relation Age of Onset  . Heart attack Father   . Coronary artery disease Father   . Hypertension Father   . Hyperlipidemia Father   . Heart attack Mother   . Hypertension Mother   . Hyperlipidemia Mother   . Breast cancer Neg Hx     Social History Social History   Tobacco Use  . Smoking status: Former Smoker    Packs/day: 0.50    Years: 44.00    Pack years: 22.00    Types: Cigarettes  . Smokeless tobacco: Never Used  Substance Use Topics  . Alcohol use: No  . Drug use: No    Review of Systems Constitutional: Negative for fever. Eyes: Negative for visual complaints ENT: Mild congestion Cardiovascular: Negative for chest pain. Respiratory: Negative for shortness of breath.  Chronic cough due to COPD per patient.  Denies any acute  worsening. Gastrointestinal: Negative for abdominal pain Genitourinary: States dysuria 1-2 days with cloudy urine yesterday. Musculoskeletal: States moderate left shoulder and left knee pain Skin: Negative for skin complaints  Neurological: Negative for headache All other ROS negative  ____________________________________________   PHYSICAL EXAM:  VITAL SIGNS: ED Triage Vitals [01/31/18 1700]  Enc Vitals Group     BP 122/90     Pulse Rate 90     Resp 20     Temp 98.8 F (37.1 C)     Temp Source Oral     SpO2 97 %     Weight 213 lb (96.6 kg)     Height 5\' 5"  (1.651 m)     Head Circumference      Peak Flow      Pain Score 10     Pain Loc      Pain Edu?      Excl. in [place]?     Constitutional: Alert and oriented. Well appearing and in no distress. Eyes: Normal exam ENT   Head: Normocephalic and atraumatic.   Nose: Mild congestion.   Mouth/Throat: Mucous membranes are moist. Cardiovascular: Normal rate, regular rhythm.  Respiratory: Normal respiratory effort without tachypnea nor retractions. Breath sounds are clear.  Occasional cough during exam. Gastrointestinal: Soft and nontender. No distention.  Obese. Musculoskeletal: Patient states she has a fused left elbow, which is evident on exam, has significant pain with attempted range of motion of the left shoulder but appears to be neurovascularly intact distally.  Patient has what appears to be a contracted left knee, with moderate pain with attempted movement of the knee.  Patient does have bruising of the knee which appears to be several weeks old.  He is also neurologically intact distally. Neurologic:  Normal speech and language. No gross focal neurologic deficits Skin:  Skin is warm, dry.  Old ecchymosis to the left knee. Psychiatric: Mood and affect are normal.   ____________________________________________   RADIOLOGY  X-ray shows old fracture of the proximal humeral metaphysis with remodeling evident.  No  acute fracture.   Knee shows osteoarthritis but no acute fracture.  ____________________________________________  INITIAL IMPRESSION / ASSESSMENT AND PLAN / ED COURSE  Pertinent labs & imaging results that were available during my care of the patient were reviewed by me and considered in my medical decision making (see chart for details).  Patient presents the emergency department for concerns of a possible urinary tract infection and fracture to her left knee since falling 3 weeks ago.  She also would like her shoulder x-ray that she states this is been hurting her since her fall as well.  Patient has not followed up with a doctor or primary care doctor besides the emergency department as she has difficulty getting to appointments that she does not ambulate since October of this past year.  Patient states she Artie has home health at home to help her.  We will obtain x-ray images of the left shoulder and left knee, will send urinalysis to help rule out urinary tract infection.  Differential this time would include urinary tract infection, arthritis, fracture, dislocations.  Urinalysis appears to be normal.  No signs of urinary tract infection.  X-ray shows an old fracture with remodeling to the left humerus, arthritic changes to the left knee without acute fracture.  At this time I believe the patient would be safe for discharge home, I do believe she would benefit from orthopedic follow-up which I discussed with the patient she will attempt to arrange transportation to follow-up with an orthopedist. ____________________________________________   FINAL CLINICAL IMPRESSION(S) / ED DIAGNOSES  Left shoulder pain Left knee pain     Harvest Dark, MD 01/31/18 1934

## 2018-01-31 NOTE — ED Triage Notes (Signed)
Pt to Ed reporting left knee pain and left shoulder pain for the past 3 weeks after falling out of bed. PT reports she has had multiple xrays of left shoulder but has bit had an xray of the left knee. Bruising noted to knee and small amount of swelling. Pt not able to straighten knee. Left pedal pulse is strong and color is appropriate. Left leg is warm.   Pt also reports cold chills and sweats and believes she has a UTI due to "milky" looking urine.   Pt had compression fracture in October and reports she did not bring back brace. Pt can not sit in a wheelchair without it.

## 2018-02-01 ENCOUNTER — Ambulatory Visit
Admission: RE | Admit: 2018-02-01 | Discharge: 2018-02-01 | Disposition: A | Discharge status: Home / Self Care | Payer: Medicare Other | Source: Ambulatory Visit | Attending: Student | Admitting: Student

## 2018-02-21 ENCOUNTER — Ambulatory Visit: Payer: Medicare Other | Attending: Student

## 2018-03-01 ENCOUNTER — Emergency Department: Payer: Medicare Other

## 2018-03-01 ENCOUNTER — Encounter: Payer: Self-pay | Admitting: Emergency Medicine

## 2018-03-01 ENCOUNTER — Emergency Department
Admission: EM | Admit: 2018-03-01 | Discharge: 2018-03-02 | Disposition: A | Discharge status: Home / Self Care | Payer: Medicare Other | Attending: Emergency Medicine | Admitting: Emergency Medicine

## 2018-03-01 DIAGNOSIS — W06XXXA Fall from bed, initial encounter: Secondary | ICD-10-CM | POA: Insufficient documentation

## 2018-03-01 DIAGNOSIS — I509 Heart failure, unspecified: Secondary | ICD-10-CM | POA: Insufficient documentation

## 2018-03-01 DIAGNOSIS — N189 Chronic kidney disease, unspecified: Secondary | ICD-10-CM | POA: Insufficient documentation

## 2018-03-01 DIAGNOSIS — F419 Anxiety disorder, unspecified: Secondary | ICD-10-CM | POA: Diagnosis not present

## 2018-03-01 DIAGNOSIS — J449 Chronic obstructive pulmonary disease, unspecified: Secondary | ICD-10-CM | POA: Diagnosis not present

## 2018-03-01 DIAGNOSIS — Y9389 Activity, other specified: Secondary | ICD-10-CM | POA: Insufficient documentation

## 2018-03-01 DIAGNOSIS — Y998 Other external cause status: Secondary | ICD-10-CM | POA: Diagnosis not present

## 2018-03-01 DIAGNOSIS — I130 Hypertensive heart and chronic kidney disease with heart failure and stage 1 through stage 4 chronic kidney disease, or unspecified chronic kidney disease: Secondary | ICD-10-CM | POA: Insufficient documentation

## 2018-03-01 DIAGNOSIS — Y92003 Bedroom of unspecified non-institutional (private) residence as the place of occurrence of the external cause: Secondary | ICD-10-CM | POA: Diagnosis not present

## 2018-03-01 DIAGNOSIS — E1122 Type 2 diabetes mellitus with diabetic chronic kidney disease: Secondary | ICD-10-CM | POA: Insufficient documentation

## 2018-03-01 DIAGNOSIS — W19XXXA Unspecified fall, initial encounter: Secondary | ICD-10-CM

## 2018-03-01 DIAGNOSIS — Z79899 Other long term (current) drug therapy: Secondary | ICD-10-CM | POA: Diagnosis not present

## 2018-03-01 DIAGNOSIS — M549 Dorsalgia, unspecified: Secondary | ICD-10-CM | POA: Diagnosis not present

## 2018-03-01 DIAGNOSIS — Z87891 Personal history of nicotine dependence: Secondary | ICD-10-CM | POA: Insufficient documentation

## 2018-03-01 DIAGNOSIS — S0990XA Unspecified injury of head, initial encounter: Secondary | ICD-10-CM | POA: Diagnosis present

## 2018-03-01 DIAGNOSIS — F329 Major depressive disorder, single episode, unspecified: Secondary | ICD-10-CM | POA: Diagnosis not present

## 2018-03-01 MED ORDER — ACETAMINOPHEN 500 MG PO TABS
1000.0000 mg | ORAL_TABLET | Freq: Once | ORAL | Status: AC
  Administered 2018-03-01: 1000 mg via ORAL
  Filled 2018-03-01: qty 2

## 2018-03-01 NOTE — Discharge Instructions (Signed)
Please seek medical attention for any high fevers, chest pain, shortness of breath, change in behavior, persistent vomiting, bloody stool or any other new or concerning symptoms.  

## 2018-03-01 NOTE — ED Triage Notes (Addendum)
Patient coming from home for fall. Patient was in hoyer lift that aid was helping her get in bed and she fell out of it and landed on side. She is recovering from a back fracture from September. Patient always on 3L [place].

## 2018-03-01 NOTE — ED Notes (Signed)
[facility] called for transport home 

## 2018-03-01 NOTE — ED Notes (Signed)
Pt states she has recently had back surgery and she was being lifted with a hoyer today and it fell forward and sideways dropping her and landing on top of her.

## 2018-03-01 NOTE — ED Provider Notes (Signed)
[facility] Emergency Department Provider Note   ____________________________________________   I have reviewed the triage vital signs and the nursing notes.   HISTORY  Chief Complaint Fall Back pain  History limited by: Not Limited   HPI Sara Pierce is a 77 y.o. female who presents to the emergency department today because of concern for back pain after falling. The patient was being transferred with the help of her aide when she fell out of the lift. Fell onto her right side. She does think she hit her head although did not have any loss of consciousness. The patient is complaining of back pain. Has a history of compression fracture. Denies any recent illness.    Per medical record review patient has a history of COPD, CHF.  Past Medical History:  Diagnosis Date  . Arthritis   . Cardiomyopathy ([place])   . Cataract   . Cerebral hemorrhage ([place])   . CHF (congestive heart failure) ([place])   . Chronic kidney disease   . Clotting disorder ([place])   . COPD (chronic obstructive pulmonary disease) ([place])   . Depression   . Diabetes mellitus without complication ([place])   . Hypertension   . Mixed incontinence   . Obesity   . Sleep apnea   . Thyroid disease   . Urinary frequency   . Vaginal atrophy   . Varicose veins     Patient Active Problem List   Diagnosis Date Noted  . COPD (chronic obstructive pulmonary disease) ([place]) 01/04/2018  . Pressure injury of skin 10/25/2017  . COPD with exacerbation ([place]) 10/24/2017  . HCAP (healthcare-associated pneumonia) 08/19/2017  . CAP (community acquired pneumonia) 10/03/2016  . Sepsis ([place]) 11/15/2015  . Absolute anemia 03/15/2015  . Anxiety 03/15/2015  . Arthritis 03/15/2015  . Cardiomyopathy, secondary ([place]) 03/15/2015  . Cataract 03/15/2015  . Brain bleed ([place]) 03/15/2015  . Chronic constipation 03/15/2015  . Chronic kidney failure 03/15/2015  . Blood clotting disorder ([place]) 03/15/2015  . 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 Clinical depression 03/15/2015  . Diabetes ([place]) 03/15/2015  . H/O varicella 03/15/2015  . BP (high blood pressure) 03/15/2015  . Mixed incontinence 03/15/2015  . 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 Adiposity 03/15/2015  . Apnea, sleep 03/15/2015  . Disease of thyroid gland 03/15/2015  . FOM (frequency of micturition) 03/15/2015  . 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 Candida vaginitis 03/15/2015  . Abnormal ECG 03/15/2015    Past Surgical History:  Procedure Laterality Date  . APPENDECTOMY    . Cardiac Catherization    . COLONOSCOPY WITH PROPOFOL N/A 04/28/2016   Procedure: COLONOSCOPY WITH PROPOFOL;  Surgeon: Manya Silvas, MD;  Location: [facility] ENDOSCOPY;  Service: Endoscopy;  Laterality: N/A;  . COLONOSCOPY WITH PROPOFOL N/A 04/29/2016   Procedure: COLONOSCOPY WITH PROPOFOL;  Surgeon: Manya Silvas, MD;  Location: [facility] ENDOSCOPY;  Service: Endoscopy;  Laterality: N/A;  . JOINT REPLACEMENT    . STRABISMUS SURGERY    . TONSILLECTOMY    . TUBAL LIGATION      Prior to Admission medications   Medication Sig Start Date End Date Taking? Authorizing Provider  albuterol (PROVENTIL) (2.5 MG/3ML) 0.083% nebulizer solution Take 2.5 mg by nebulization every 4 (four) hours as needed for wheezing or shortness of breath.    [provider]  budesonide (PULMICORT) 0.5 MG/2ML nebulizer solution Take 2 mLs (0.5 mg total) 2 (two) times daily by nebulization. 10/28/17   Bridgett Larsson,  Sheppard Evens, MD  buPROPion (WELLBUTRIN XL) 150 MG 24 hr tablet Take 150 mg by mouth daily.    [provider]  ferrous sulfate 325 (65 FE) MG tablet Take 1 tablet (325 mg total) 2 (two) times daily with a meal by mouth. 10/28/17   Demetrios Loll, MD  fluticasone [facility]) 50 MCG/ACT nasal spray Place 2 sprays into both nostrils daily.     [provider]  guaiFENesin-dextromethorphan (ROBITUSSIN DM) 100-10 MG/5ML syrup Take 10  mLs by mouth every 6 (six) hours as needed for cough. Patient not taking: Reported on 10/24/2017 10/04/16   Nicholes Mango, MD  hydrOXYzine (ATARAX/VISTARIL) 25 MG tablet Take 25 mg by mouth 3 (three) times daily as needed.    [provider]  ipratropium-albuterol (DUONEB) 0.5-2.5 (3) MG/3ML SOLN Inhale 3 mLs into the lungs every 6 (six) hours as needed (wheezing).     [provider]  levofloxacin (LEVAQUIN) 500 MG tablet Take 1 tablet (500 mg total) by mouth daily. 01/06/18   Fritzi Mandes, MD  liothyronine (CYTOMEL) 25 MCG tablet Take 25 mcg by mouth daily.     [provider]  magnesium oxide (MAG-OX) 400 MG tablet Take 400 mg by mouth daily.     [provider]  montelukast (SINGULAIR) 10 MG tablet Take 10 mg by mouth at bedtime.     [provider]  Multiple Vitamin (MULTIVITAMIN WITH MINERALS) TABS tablet Take 1 tablet by mouth daily. 01/06/18   Fritzi Mandes, MD  naloxone [facility]) nasal spray 4 mg/0.1 mL Place 1 spray into the nose. Repeat every 3 minutes if no response.    [provider]  nystatin cream (MYCOSTATIN) Apply 1 application topically 2 (two) times daily.    [provider]  Oxycodone HCl 10 MG TABS Take 10 mg every 6 (six) hours as needed by mouth for moderate pain, severe pain or breakthrough pain.     [provider]  potassium chloride (K-DUR) 10 MEQ tablet Take 10 mEq by mouth daily.     [provider]  predniSONE (DELTASONE) 10 MG tablet Take 50 mg daily taper by 10 mg daily than stop 01/06/18   Fritzi Mandes, MD  primidone (MYSOLINE) 50 MG tablet Take 50 mg by mouth 2 (two) times daily.     [provider]  senna-docusate (SENOKOT-S) 8.6-50 MG tablet Take 2 tablets by mouth daily.    [provider]  simvastatin (ZOCOR) 40 MG tablet Take 40 mg by mouth at bedtime.     [provider]  sodium chloride (OCEAN) 0.65 % SOLN nasal spray Place 2 sprays into both nostrils 3 (three)  times daily as needed for congestion. Patient not taking: Reported on 10/24/2017 10/04/16   Nicholes Mango, MD  tiZANidine (ZANAFLEX) 4 MG tablet Take 4 mg by mouth every 12 (twelve) hours as needed for muscle spasms.    [provider]  umeclidinium-vilanterol (ANORO ELLIPTA) 62.5-25 MCG/INH AEPB Inhale 1 puff into the lungs daily.    [provider]    Allergies Aspirin; Codeine; Naprosyn [naproxen]; Other; Tape; and Valacyclovir  Family History  Problem Relation Age of Onset  . Heart attack Father   . Coronary artery disease Father   . Hypertension Father   . Hyperlipidemia Father   . Heart attack Mother   . Hypertension Mother   . Hyperlipidemia Mother   . Breast cancer Neg Hx     Social History Social History   Tobacco Use  . Smoking status:  Former Smoker    Packs/day: 0.50    Years: 44.00    Pack years: 22.00    Types: Cigarettes  . Smokeless tobacco: Never Used  Substance Use Topics  . Alcohol use: No  . Drug use: No    Review of Systems Constitutional: No fever/chills Eyes: No visual changes. ENT: No sore throat. Cardiovascular: Denies chest pain. Respiratory: Denies shortness of breath. Gastrointestinal: No abdominal pain.  No nausea, no vomiting.  No diarrhea.   Genitourinary: Negative for dysuria. Musculoskeletal: Positive for back pain. Skin: Negative for rash. Neurological: Negative for headaches, focal weakness or numbness.  ____________________________________________   PHYSICAL EXAM:  VITAL SIGNS: ED Triage Vitals  Enc Vitals Group     BP 03/01/18 1926 120/81     Pulse Rate 03/01/18 1926 100     Resp 03/01/18 1926 20     Temp 03/01/18 1926 98.1 F (36.7 C)     Temp src --      SpO2 03/01/18 1926 98 %     Weight 03/01/18 1927 213 lb (96.6 kg)     Height 03/01/18 1927 5\' 5"  (1.651 m)     Head Circumference --      Peak Flow --      Pain Score 03/01/18 1926 8   Constitutional: Alert and oriented. Well appearing and in  no distress. Eyes: Conjunctivae are normal.  ENT   Head: Normocephalic and atraumatic.   Nose: No congestion/rhinnorhea.   Mouth/Throat: Mucous membranes are moist.   Neck: No stridor. No midline tenderness. Hematological/Lymphatic/Immunilogical: No cervical lymphadenopathy. Cardiovascular: Normal rate, regular rhythm.  No murmurs, rubs, or gallops.  Respiratory: Normal respiratory effort without tachypnea nor retractions. Breath sounds are clear and equal bilaterally. No wheezes/rales/rhonchi. Gastrointestinal: Soft and non tender. No rebound. No guarding.  Genitourinary: Deferred Musculoskeletal: Normal range of motion in all extremities. No lower extremity edema. Neurologic:  Normal speech and language. No gross focal neurologic deficits are appreciated.  Skin:  Skin is warm, dry and intact. No rash noted. Psychiatric: Mood and affect are normal. Speech and behavior are normal. Patient exhibits appropriate insight and judgment.  ____________________________________________    LABS (pertinent positives/negatives)  None  ____________________________________________   EKG  None  ____________________________________________    RADIOLOGY  CT head No acute process  CT pelvis No acute osseous injury  DG thoracic Known compression fracture no acute process  DG lumbar Concern for step off deformity of the sacrum  ____________________________________________   PROCEDURES  Procedures  ____________________________________________   INITIAL IMPRESSION / ASSESSMENT AND PLAN / ED COURSE  Pertinent labs & imaging results that were available during my care of the patient were reviewed by me and considered in my medical decision making (see chart for details).  Patient presented to the emergency department today because of concern for back pain after a fall. CT head was negative. Initial lumbar x-ray concerning for step off however ct scan did not show any  acute abnormality. Will plan on discharging home. Discussed findings of imaging with patient.    ____________________________________________   FINAL CLINICAL IMPRESSION(S) / ED DIAGNOSES  Final diagnoses:  Fall, initial encounter  Back pain, unspecified back location, unspecified back pain laterality, unspecified chronicity     Note: This dictation was prepared with Dragon dictation. Any transcriptional errors that result from this process are unintentional     Nance Pear, MD 03/01/18 2248

## 2018-03-23 ENCOUNTER — Encounter: Payer: Self-pay | Admitting: Emergency Medicine

## 2018-03-23 ENCOUNTER — Emergency Department: Payer: Medicare Other

## 2018-03-23 ENCOUNTER — Inpatient Hospital Stay
Admission: EM | Admit: 2018-03-23 | Discharge: 2018-03-27 | DRG: 190 | Disposition: A | Discharge status: Skilled Nursing Facility | Payer: Medicare Other | Attending: Internal Medicine | Admitting: Internal Medicine

## 2018-03-23 DIAGNOSIS — Z7951 Long term (current) use of inhaled steroids: Secondary | ICD-10-CM

## 2018-03-23 DIAGNOSIS — Z9981 Dependence on supplemental oxygen: Secondary | ICD-10-CM

## 2018-03-23 DIAGNOSIS — I429 Cardiomyopathy, unspecified: Secondary | ICD-10-CM | POA: Diagnosis present

## 2018-03-23 DIAGNOSIS — M1990 Unspecified osteoarthritis, unspecified site: Secondary | ICD-10-CM | POA: Diagnosis present

## 2018-03-23 DIAGNOSIS — Z91048 Other nonmedicinal substance allergy status: Secondary | ICD-10-CM

## 2018-03-23 DIAGNOSIS — Z8673 Personal history of transient ischemic attack (TIA), and cerebral infarction without residual deficits: Secondary | ICD-10-CM

## 2018-03-23 DIAGNOSIS — J209 Acute bronchitis, unspecified: Secondary | ICD-10-CM | POA: Diagnosis present

## 2018-03-23 DIAGNOSIS — Z79899 Other long term (current) drug therapy: Secondary | ICD-10-CM | POA: Diagnosis not present

## 2018-03-23 DIAGNOSIS — E039 Hypothyroidism, unspecified: Secondary | ICD-10-CM | POA: Diagnosis present

## 2018-03-23 DIAGNOSIS — F419 Anxiety disorder, unspecified: Secondary | ICD-10-CM | POA: Diagnosis present

## 2018-03-23 DIAGNOSIS — J441 Chronic obstructive pulmonary disease with (acute) exacerbation: Secondary | ICD-10-CM | POA: Diagnosis not present

## 2018-03-23 DIAGNOSIS — Z886 Allergy status to analgesic agent status: Secondary | ICD-10-CM

## 2018-03-23 DIAGNOSIS — E1140 Type 2 diabetes mellitus with diabetic neuropathy, unspecified: Secondary | ICD-10-CM | POA: Diagnosis present

## 2018-03-23 DIAGNOSIS — J9621 Acute and chronic respiratory failure with hypoxia: Secondary | ICD-10-CM | POA: Diagnosis present

## 2018-03-23 DIAGNOSIS — Z6836 Body mass index (BMI) 36.0-36.9, adult: Secondary | ICD-10-CM | POA: Diagnosis not present

## 2018-03-23 DIAGNOSIS — N183 Chronic kidney disease, stage 3 (moderate): Secondary | ICD-10-CM | POA: Diagnosis present

## 2018-03-23 DIAGNOSIS — D509 Iron deficiency anemia, unspecified: Secondary | ICD-10-CM | POA: Diagnosis present

## 2018-03-23 DIAGNOSIS — G4733 Obstructive sleep apnea (adult) (pediatric): Secondary | ICD-10-CM | POA: Diagnosis present

## 2018-03-23 DIAGNOSIS — J440 Chronic obstructive pulmonary disease with acute lower respiratory infection: Secondary | ICD-10-CM | POA: Diagnosis present

## 2018-03-23 DIAGNOSIS — E669 Obesity, unspecified: Secondary | ICD-10-CM | POA: Diagnosis present

## 2018-03-23 DIAGNOSIS — E1122 Type 2 diabetes mellitus with diabetic chronic kidney disease: Secondary | ICD-10-CM | POA: Diagnosis present

## 2018-03-23 DIAGNOSIS — Z22322 Carrier or suspected carrier of Methicillin resistant Staphylococcus aureus: Secondary | ICD-10-CM

## 2018-03-23 DIAGNOSIS — J9611 Chronic respiratory failure with hypoxia: Secondary | ICD-10-CM

## 2018-03-23 DIAGNOSIS — E785 Hyperlipidemia, unspecified: Secondary | ICD-10-CM | POA: Diagnosis present

## 2018-03-23 DIAGNOSIS — I130 Hypertensive heart and chronic kidney disease with heart failure and stage 1 through stage 4 chronic kidney disease, or unspecified chronic kidney disease: Secondary | ICD-10-CM | POA: Diagnosis present

## 2018-03-23 DIAGNOSIS — Z87891 Personal history of nicotine dependence: Secondary | ICD-10-CM

## 2018-03-23 DIAGNOSIS — Z885 Allergy status to narcotic agent status: Secondary | ICD-10-CM | POA: Diagnosis not present

## 2018-03-23 DIAGNOSIS — Z888 Allergy status to other drugs, medicaments and biological substances status: Secondary | ICD-10-CM

## 2018-03-23 DIAGNOSIS — I5032 Chronic diastolic (congestive) heart failure: Secondary | ICD-10-CM | POA: Diagnosis present

## 2018-03-23 LAB — COMPREHENSIVE METABOLIC PANEL
ALBUMIN: 4 g/dL (ref 3.5–5.0)
ALT: 10 U/L — AB (ref 14–54)
AST: 16 U/L (ref 15–41)
Alkaline Phosphatase: 86 U/L (ref 38–126)
Anion gap: 9 (ref 5–15)
BUN: 22 mg/dL — AB (ref 6–20)
CO2: 30 mmol/L (ref 22–32)
CREATININE: 0.83 mg/dL (ref 0.44–1.00)
Calcium: 9.7 mg/dL (ref 8.9–10.3)
Chloride: 96 mmol/L — ABNORMAL LOW (ref 101–111)
GFR calc non Af Amer: >60 mL/min (ref >60)
GLUCOSE: 106 mg/dL — AB (ref 65–99)
Potassium: 4.3 mmol/L (ref 3.5–5.1)
SODIUM: 135 mmol/L (ref 135–145)
Total Bilirubin: 0.8 mg/dL (ref 0.3–1.2)
Total Protein: 7.2 g/dL (ref 6.5–8.1)

## 2018-03-23 LAB — CBC WITH DIFFERENTIAL/PLATELET
BASOS ABS: 0 10*3/uL (ref 0–0.1)
Basophils Relative: 1 %
EOS ABS: 0.1 10*3/uL (ref 0–0.7)
EOS PCT: 1 %
HCT: 36.1 % (ref 35.0–47.0)
HEMOGLOBIN: 12.1 g/dL (ref 12.0–16.0)
Lymphocytes Relative: 17 %
Lymphs Abs: 1.5 10*3/uL (ref 1.0–3.6)
MCH: 31.6 pg (ref 26.0–34.0)
MCHC: 33.7 g/dL (ref 32.0–36.0)
MCV: 93.9 fL (ref 80.0–100.0)
Monocytes Absolute: 0.4 10*3/uL (ref 0.2–0.9)
Monocytes Relative: 4 %
NEUTROS PCT: 77 %
Neutro Abs: 6.8 10*3/uL — ABNORMAL HIGH (ref 1.4–6.5)
Platelets: 270 10*3/uL (ref 150–440)
RBC: 3.84 MIL/uL (ref 3.80–5.20)
RDW: 15.9 % — ABNORMAL HIGH (ref 11.5–14.5)
WBC: 8.8 10*3/uL (ref 3.6–11.0)

## 2018-03-23 LAB — TROPONIN I

## 2018-03-23 LAB — BRAIN NATRIURETIC PEPTIDE: B Natriuretic Peptide: 68 pg/mL (ref 0.0–100.0)

## 2018-03-23 MED ORDER — MONTELUKAST SODIUM 10 MG PO TABS
10.0000 mg | ORAL_TABLET | Freq: Every day | ORAL | Status: DC
  Administered 2018-03-23 – 2018-03-26 (×4): 10 mg via ORAL
  Filled 2018-03-23 (×4): qty 1

## 2018-03-23 MED ORDER — ALBUTEROL SULFATE (2.5 MG/3ML) 0.083% IN NEBU
5.0000 mg | INHALATION_SOLUTION | Freq: Once | RESPIRATORY_TRACT | Status: AC
  Administered 2018-03-23: 5 mg via RESPIRATORY_TRACT
  Filled 2018-03-23: qty 6

## 2018-03-23 MED ORDER — TIZANIDINE HCL 2 MG PO TABS
2.0000 mg | ORAL_TABLET | Freq: Four times a day (QID) | ORAL | Status: DC | PRN
  Filled 2018-03-23: qty 1

## 2018-03-23 MED ORDER — VITAMIN D (ERGOCALCIFEROL) 1.25 MG (50000 UNIT) PO CAPS
50000.0000 [IU] | ORAL_CAPSULE | ORAL | Status: DC
  Administered 2018-03-23: 50000 [IU] via ORAL
  Filled 2018-03-23: qty 1

## 2018-03-23 MED ORDER — ENOXAPARIN SODIUM 40 MG/0.4ML ~~LOC~~ SOLN
40.0000 mg | SUBCUTANEOUS | Status: DC
  Administered 2018-03-23 – 2018-03-26 (×4): 40 mg via SUBCUTANEOUS
  Filled 2018-03-23 (×4): qty 0.4

## 2018-03-23 MED ORDER — IPRATROPIUM-ALBUTEROL 0.5-2.5 (3) MG/3ML IN SOLN
3.0000 mL | Freq: Once | RESPIRATORY_TRACT | Status: AC
  Administered 2018-03-23: 3 mL via RESPIRATORY_TRACT
  Filled 2018-03-23: qty 3

## 2018-03-23 MED ORDER — AZITHROMYCIN 250 MG PO TABS
500.0000 mg | ORAL_TABLET | Freq: Every day | ORAL | Status: DC
  Administered 2018-03-24: 500 mg via ORAL
  Filled 2018-03-23: qty 2

## 2018-03-23 MED ORDER — LIOTHYRONINE SODIUM 25 MCG PO TABS
25.0000 ug | ORAL_TABLET | Freq: Every day | ORAL | Status: DC
Start: 2018-03-24 — End: 2018-03-27
  Administered 2018-03-24 – 2018-03-27 (×4): 25 ug via ORAL
  Filled 2018-03-23 (×4): qty 1

## 2018-03-23 MED ORDER — ONDANSETRON HCL 4 MG PO TABS: 4.0000 mg | ORAL_TABLET | Freq: Four times a day (QID) | ORAL | Status: DC | PRN

## 2018-03-23 MED ORDER — MAGNESIUM OXIDE 400 (241.3 MG) MG PO TABS
400.0000 mg | ORAL_TABLET | Freq: Every day | ORAL | Status: DC
  Administered 2018-03-24 – 2018-03-27 (×4): 400 mg via ORAL
  Filled 2018-03-23 (×4): qty 1

## 2018-03-23 MED ORDER — FUROSEMIDE 40 MG PO TABS
40.0000 mg | ORAL_TABLET | Freq: Two times a day (BID) | ORAL | Status: DC
  Administered 2018-03-23 – 2018-03-27 (×8): 40 mg via ORAL
  Filled 2018-03-23 (×8): qty 1

## 2018-03-23 MED ORDER — MAGNESIUM SULFATE 2 GM/50ML IV SOLN
2.0000 g | INTRAVENOUS | Status: AC
  Administered 2018-03-23: 2 g via INTRAVENOUS
  Filled 2018-03-23: qty 50

## 2018-03-23 MED ORDER — POLYETHYLENE GLYCOL 3350 17 GM/SCOOP PO POWD
17.0000 g | Freq: Every day | ORAL | Status: DC
  Administered 2018-03-24 – 2018-03-26 (×3): 17 g via ORAL
  Filled 2018-03-23 (×2): qty 255

## 2018-03-23 MED ORDER — METHYLPREDNISOLONE SODIUM SUCC 40 MG IJ SOLR
40.0000 mg | Freq: Four times a day (QID) | INTRAMUSCULAR | Status: DC
  Administered 2018-03-23 – 2018-03-26 (×12): 40 mg via INTRAVENOUS
  Filled 2018-03-23 (×12): qty 1

## 2018-03-23 MED ORDER — ALBUTEROL SULFATE (2.5 MG/3ML) 0.083% IN NEBU: 2.5000 mg | INHALATION_SOLUTION | RESPIRATORY_TRACT | Status: DC | PRN

## 2018-03-23 MED ORDER — SIMVASTATIN 40 MG PO TABS
40.0000 mg | ORAL_TABLET | Freq: Every day | ORAL | Status: DC
  Administered 2018-03-23 – 2018-03-26 (×4): 40 mg via ORAL
  Filled 2018-03-23 (×5): qty 1

## 2018-03-23 MED ORDER — IPRATROPIUM-ALBUTEROL 0.5-2.5 (3) MG/3ML IN SOLN
3.0000 mL | Freq: Four times a day (QID) | RESPIRATORY_TRACT | Status: DC
  Administered 2018-03-23 – 2018-03-27 (×15): 3 mL via RESPIRATORY_TRACT
  Filled 2018-03-23 (×17): qty 3

## 2018-03-23 MED ORDER — ONDANSETRON HCL 4 MG/2ML IJ SOLN: 4.0000 mg | Freq: Four times a day (QID) | INTRAMUSCULAR | Status: DC | PRN

## 2018-03-23 MED ORDER — PRIMIDONE 50 MG PO TABS
50.0000 mg | ORAL_TABLET | Freq: Two times a day (BID) | ORAL | Status: DC
  Administered 2018-03-23 – 2018-03-27 (×8): 50 mg via ORAL
  Filled 2018-03-23 (×9): qty 1

## 2018-03-23 MED ORDER — BUPROPION HCL ER (XL) 150 MG PO TB24
150.0000 mg | ORAL_TABLET | Freq: Every day | ORAL | Status: DC
  Administered 2018-03-24 – 2018-03-27 (×4): 150 mg via ORAL
  Filled 2018-03-23 (×4): qty 1

## 2018-03-23 MED ORDER — POTASSIUM CHLORIDE ER 10 MEQ PO TBCR
10.0000 meq | EXTENDED_RELEASE_TABLET | Freq: Two times a day (BID) | ORAL | Status: DC
  Administered 2018-03-23 – 2018-03-27 (×8): 10 meq via ORAL
  Filled 2018-03-23 (×16): qty 1

## 2018-03-23 MED ORDER — METHYLPREDNISOLONE SODIUM SUCC 125 MG IJ SOLR
125.0000 mg | Freq: Once | INTRAMUSCULAR | Status: AC
  Administered 2018-03-23: 125 mg via INTRAVENOUS
  Filled 2018-03-23: qty 2

## 2018-03-23 MED ORDER — GABAPENTIN 300 MG PO CAPS
300.0000 mg | ORAL_CAPSULE | Freq: Three times a day (TID) | ORAL | Status: DC
  Administered 2018-03-23 – 2018-03-27 (×11): 300 mg via ORAL
  Filled 2018-03-23 (×11): qty 1

## 2018-03-23 MED ORDER — HYDROXYZINE HCL 25 MG PO TABS: 25.0000 mg | ORAL_TABLET | Freq: Two times a day (BID) | ORAL | Status: DC | PRN

## 2018-03-23 MED ORDER — BUDESONIDE 0.5 MG/2ML IN SUSP
0.5000 mg | Freq: Two times a day (BID) | RESPIRATORY_TRACT | Status: DC
  Administered 2018-03-23 – 2018-03-27 (×8): 0.5 mg via RESPIRATORY_TRACT
  Filled 2018-03-23 (×8): qty 2

## 2018-03-23 MED ORDER — AZITHROMYCIN 500 MG IV SOLR
500.0000 mg | Freq: Once | INTRAVENOUS | Status: AC
  Administered 2018-03-23: 500 mg via INTRAVENOUS
  Filled 2018-03-23: qty 500

## 2018-03-23 MED ORDER — ALBUTEROL SULFATE (2.5 MG/3ML) 0.083% IN NEBU: 3.0000 mL | INHALATION_SOLUTION | Freq: Four times a day (QID) | RESPIRATORY_TRACT | Status: DC | PRN

## 2018-03-23 MED ORDER — SODIUM CHLORIDE 0.9 % IV SOLN
1.0000 g | Freq: Once | INTRAVENOUS | Status: AC
  Administered 2018-03-23: 1 g via INTRAVENOUS
  Filled 2018-03-23: qty 10

## 2018-03-23 MED ORDER — GUAIFENESIN-DM 100-10 MG/5ML PO SYRP
5.0000 mL | ORAL_SOLUTION | ORAL | Status: DC | PRN
  Administered 2018-03-23 – 2018-03-27 (×2): 5 mL via ORAL
  Filled 2018-03-23 (×2): qty 5

## 2018-03-23 MED ORDER — SPIRONOLACTONE 25 MG PO TABS
25.0000 mg | ORAL_TABLET | Freq: Every day | ORAL | Status: DC
  Administered 2018-03-24 – 2018-03-27 (×4): 25 mg via ORAL
  Filled 2018-03-23 (×4): qty 1

## 2018-03-23 MED ORDER — ACETAMINOPHEN 650 MG RE SUPP: 650.0000 mg | Freq: Four times a day (QID) | RECTAL | Status: DC | PRN

## 2018-03-23 MED ORDER — ACETAMINOPHEN 325 MG PO TABS
650.0000 mg | ORAL_TABLET | Freq: Four times a day (QID) | ORAL | Status: DC | PRN
  Administered 2018-03-23 – 2018-03-26 (×3): 650 mg via ORAL
  Filled 2018-03-23 (×3): qty 2

## 2018-03-23 MED ORDER — DIPHENHYDRAMINE HCL 25 MG PO CAPS: 25.0000 mg | ORAL_CAPSULE | Freq: Four times a day (QID) | ORAL | Status: DC | PRN

## 2018-03-23 MED ORDER — FERROUS SULFATE 325 (65 FE) MG PO TABS
325.0000 mg | ORAL_TABLET | Freq: Two times a day (BID) | ORAL | Status: DC
  Administered 2018-03-24 – 2018-03-27 (×7): 325 mg via ORAL
  Filled 2018-03-23 (×7): qty 1

## 2018-03-23 NOTE — ED Notes (Signed)
Pt has congested productive cough - she states that her PCP called her at home and told them pt had pneumonia from a cxr performed yesterday - pt states that she is having shortness of breath and some chest pain related to the cough

## 2018-03-23 NOTE — ED Notes (Signed)
Patient transported to CT 

## 2018-03-23 NOTE — ED Triage Notes (Signed)
Pt arrived with cna from doctors office with complaints of productive cough for the last week. Pt also reports chest tightness when coughing but denies pain or tightness when she is not coughing. Pt denies any home fevers. Wheezing heard on auscultation.

## 2018-03-23 NOTE — ED Provider Notes (Signed)
[facility] Emergency Department Provider Note  ____________________________________________  Time seen: Approximately 3:54 PM  I have reviewed the triage vital signs and the nursing notes.   HISTORY  Chief Complaint Chest Pain; Shortness of Breath; and Cough    HPI Sara Pierce is a 77 y.o. female with a history of COPD, diabetes, CHF who complains of worsening shortness of breath and productive cough for the past 2-3 days. She relates it to exposure to heavy pollen over the last week. This is worsened by her husband leaving the house door open for fresh air which has caused the entire interior of the house to be covered in yellow pollen. Patient denies fever or chills. Shortness of breath is constant, severe, no aggravating or alleviating factors.no improvement with her home bronchodilators and inhaled steroids.      Past Medical History:  Diagnosis Date  . Arthritis   . Cardiomyopathy ([place])   . Cataract   . Cerebral hemorrhage ([place])   . CHF (congestive heart failure) ([place])   . Chronic kidney disease   . Clotting disorder ([place])   . COPD (chronic obstructive pulmonary disease) ([place])   . Depression   . Diabetes mellitus without complication ([place])   . Hypertension   . Mixed incontinence   . Obesity   . Sleep apnea   . Thyroid disease   . Urinary frequency   . Vaginal atrophy   . Varicose veins      Patient Active Problem List   Diagnosis Date Noted  . COPD (chronic obstructive pulmonary disease) ([place]) 01/04/2018  . Pressure injury of skin 10/25/2017  . COPD with exacerbation ([place]) 10/24/2017  . HCAP (healthcare-associated pneumonia) 08/19/2017  . CAP (community acquired pneumonia) 10/03/2016  . Sepsis ([place]) 11/15/2015  . Absolute anemia 03/15/2015  . Anxiety 03/15/2015  . Arthritis 03/15/2015  . Cardiomyopathy, secondary ([place]) 03/15/2015  . Cataract 03/15/2015  . Brain bleed ([place]) 03/15/2015  . Chronic constipation 03/15/2015  .  Chronic kidney failure 03/15/2015  . Blood clotting disorder ([place]) 03/15/2015  . CAFL (chronic airflow limitation) ([place]) 03/15/2015  . Clinical depression 03/15/2015  . Diabetes ([place]) 03/15/2015  . H/O varicella 03/15/2015  . BP (high blood pressure) 03/15/2015  . Mixed incontinence 03/15/2015  . 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 Adiposity 03/15/2015  . Apnea, sleep 03/15/2015  . Disease of thyroid gland 03/15/2015  . FOM (frequency of micturition) 03/15/2015  . 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 Candida vaginitis 03/15/2015  . Abnormal ECG 03/15/2015     Past Surgical History:  Procedure Laterality Date  . APPENDECTOMY    . Cardiac Catherization    . COLONOSCOPY WITH PROPOFOL N/A 04/28/2016   Procedure: COLONOSCOPY WITH PROPOFOL;  Surgeon: Manya Silvas, MD;  Location: [facility] ENDOSCOPY;  Service: Endoscopy;  Laterality: N/A;  . COLONOSCOPY WITH PROPOFOL N/A 04/29/2016   Procedure: COLONOSCOPY WITH PROPOFOL;  Surgeon: Manya Silvas, MD;  Location: [facility] ENDOSCOPY;  Service: Endoscopy;  Laterality: N/A;  . JOINT REPLACEMENT    . STRABISMUS SURGERY    . TONSILLECTOMY    . TUBAL LIGATION       Prior to Admission medications   Medication Sig Start Date End Date Taking? Authorizing Provider  albuterol (PROVENTIL) (2.5 MG/3ML) 0.083% nebulizer solution Take 2.5 mg by nebulization every 4 (four) hours as needed for wheezing or shortness of breath.    [provider]  budesonide (PULMICORT) 0.5 MG/2ML nebulizer solution Take 2 mLs (  0.5 mg total) 2 (two) times daily by nebulization. 10/28/17   Demetrios Loll, MD  buPROPion (WELLBUTRIN XL) 150 MG 24 hr tablet Take 150 mg by mouth daily.    [provider]  ferrous sulfate 325 (65 FE) MG tablet Take 1 tablet (325 mg total) 2 (two) times daily with a meal by mouth. 10/28/17   Demetrios Loll, MD  fluticasone [facility]) 50 MCG/ACT nasal spray Place 2 sprays into both nostrils daily.      [provider]  guaiFENesin-dextromethorphan (ROBITUSSIN DM) 100-10 MG/5ML syrup Take 10 mLs by mouth every 6 (six) hours as needed for cough. Patient not taking: Reported on 10/24/2017 10/04/16   Nicholes Mango, MD  hydrOXYzine (ATARAX/VISTARIL) 25 MG tablet Take 25 mg by mouth 3 (three) times daily as needed.    [provider]  ipratropium-albuterol (DUONEB) 0.5-2.5 (3) MG/3ML SOLN Inhale 3 mLs into the lungs every 6 (six) hours as needed (wheezing).     [provider]  levofloxacin (LEVAQUIN) 500 MG tablet Take 1 tablet (500 mg total) by mouth daily. 01/06/18   Fritzi Mandes, MD  liothyronine (CYTOMEL) 25 MCG tablet Take 25 mcg by mouth daily.     [provider]  magnesium oxide (MAG-OX) 400 MG tablet Take 400 mg by mouth daily.     [provider]  montelukast (SINGULAIR) 10 MG tablet Take 10 mg by mouth at bedtime.     [provider]  Multiple Vitamin (MULTIVITAMIN WITH MINERALS) TABS tablet Take 1 tablet by mouth daily. 01/06/18   Fritzi Mandes, MD  naloxone [facility]) nasal spray 4 mg/0.1 mL Place 1 spray into the nose. Repeat every 3 minutes if no response.    [provider]  nystatin cream (MYCOSTATIN) Apply 1 application topically 2 (two) times daily.    [provider]  Oxycodone HCl 10 MG TABS Take 10 mg every 6 (six) hours as needed by mouth for moderate pain, severe pain or breakthrough pain.     [provider]  potassium chloride (K-DUR) 10 MEQ tablet Take 10 mEq by mouth daily.     [provider]  predniSONE (DELTASONE) 10 MG tablet Take 50 mg daily taper by 10 mg daily than stop 01/06/18   Fritzi Mandes, MD  primidone (MYSOLINE) 50 MG tablet Take 50 mg by mouth 2 (two) times daily.     [provider]  senna-docusate (SENOKOT-S) 8.6-50 MG tablet Take 2 tablets by mouth daily.    [provider]  simvastatin (ZOCOR) 40 MG tablet Take 40 mg by mouth at bedtime.     [provider]  sodium chloride (OCEAN) 0.65 % SOLN nasal spray Place 2 sprays into both nostrils 3 (three) times daily as needed for congestion. Patient not taking: Reported on 10/24/2017 10/04/16   Nicholes Mango, MD  tiZANidine (ZANAFLEX) 4 MG tablet Take 4 mg by mouth every 12 (twelve) hours as needed for muscle spasms.    [provider]  umeclidinium-vilanterol (ANORO ELLIPTA) 62.5-25 MCG/INH AEPB Inhale 1 puff into the lungs daily.    [provider]     Allergies Aspirin; Codeine; Naprosyn [naproxen]; Other; Tape; and Valacyclovir   Family History  Problem Relation Age of Onset  . Heart attack Father   . Coronary artery disease Father   . Hypertension Father   . Hyperlipidemia Father   . Heart attack Mother   . Hypertension Mother   . Hyperlipidemia Mother   . Breast cancer Neg Hx  Social History Social History   Tobacco Use  . Smoking status: Former Smoker    Packs/day: 0.50    Years: 44.00    Pack years: 22.00    Types: Cigarettes  . Smokeless tobacco: Never Used  Substance Use Topics  . Alcohol use: No  . Drug use: No    Review of Systems  Constitutional:   No fever or chills.  ENT:   No sore throat. No rhinorrhea. Cardiovascular:   No chest pain or syncope. Respiratory:   positive shortness of breath and productive cough. Gastrointestinal:   Negative for abdominal pain, vomiting and diarrhea.  Musculoskeletal:   Negative for focal pain or swelling All other systems reviewed and are negative except as documented above in ROS and HPI.  ____________________________________________   PHYSICAL EXAM:  VITAL SIGNS: ED Triage Vitals  Enc Vitals Group     BP 03/23/18 1107 111/65     Pulse Rate 03/23/18 1107 78     Resp 03/23/18 1107 20     Temp 03/23/18 1107 98.4 F (36.9 C)     Temp Source 03/23/18 1107 Oral     SpO2 03/23/18 1107 98 %     Weight 03/23/18 1109 213 lb (96.6 kg)     Height 03/23/18 1109 5\' 5"  (1.651 m)     Head  Circumference --      Peak Flow --      Pain Score 03/23/18 1108 6     Pain Loc --      Pain Edu? --      Excl. in [place]? --     Vital signs reviewed, nursing assessments reviewed.   Constitutional:   Alert and oriented. Well appearing and in no distress. Eyes:   Conjunctivae are normal. EOMI. PERRL. ENT      Head:   Normocephalic and atraumatic.      Nose:   No congestion/rhinnorhea.       Mouth/Throat:   dry mucous membranes, no pharyngeal erythema. No peritonsillar mass.       Neck:   No meningismus. Full ROM. Hematological/Lymphatic/Immunilogical:   No cervical lymphadenopathy. Cardiovascular:   RRR. Symmetric bilateral radial and DP pulses.  No murmurs.  Respiratory:   tachypnea, increased work of breathing. Rhonchorous breath sounds bilateral lower lungs. Diffuse expiratory wheezing with prolonged expiratory phase.. Gastrointestinal:   Soft and nontender. Non distended. There is no CVA tenderness.  No rebound, rigidity, or guarding. Genitourinary:   deferred Musculoskeletal:   Normal range of motion in all extremities. No joint effusions.  No lower extremity tenderness.  No edema. Neurologic:   Normal speech and language.  bilateral lower extremity paralysis, chronic per patient. No acute focal neurologic deficits are appreciated.  Skin:    Skin is warm, dry and intact. No rash noted.  No petechiae, purpura, or bullae.  ____________________________________________    LABS (pertinent positives/negatives) (all labs ordered are listed, but only abnormal results are displayed) Labs Reviewed  CBC WITH DIFFERENTIAL/PLATELET - Abnormal; Notable for the following components:      Result Value   RDW 15.9 (*)    Neutro Abs 6.8 (*)    All other components within normal limits  COMPREHENSIVE METABOLIC PANEL - Abnormal; Notable for the following components:   Chloride 96 (*)    Glucose, Bld 106 (*)    BUN 22 (*)    ALT 10 (*)    All other components within normal limits  BRAIN  NATRIURETIC PEPTIDE  TROPONIN I   ____________________________________________  EKG  interpreted by me Sinus rhythm rate of 78, normal axis, normal intervals. Right bundle branch block. No acute ischemic changes.  ____________________________________________    RADIOLOGY  Dg Chest 2 View  Result Date: 03/23/2018 CLINICAL DATA:  Congestion, productive cough, pneumonia, shortness of breath, chest pain, history CHF, COPD, cardiomyopathy EXAM: CHEST - 2 VIEW COMPARISON:  01/05/2018 FINDINGS: Slightly rotated to the LEFT. Borderline enlargement of cardiac silhouette. Atherosclerotic calcification aorta. Enlarged central pulmonary arteries question pulmonary arterial hypertension. Bibasilar and RIGHT mid lung atelectasis. No definite acute infiltrate, pleural effusion or pneumothorax. Abnormal RIGHT paramediastinal density superior to the RIGHT hilum at the level of the aortic arch is likely related to the marked compression fracture of the T6 vertebral body with paraspinal calcific debris and spurring as noted on prior CT exams, accentuated by rotation to the LEFT. Bones demineralized with note of a RIGHT shoulder prosthesis. IMPRESSION: Bibasilar atelectasis. No new abnormalities. Electronically Signed   By: Lavonia Dana M.D.   On: 03/23/2018 12:37   Ct Chest Wo Contrast  Result Date: 03/23/2018 CLINICAL DATA:  COPD exacerbation.  Dyspnea.  Productive cough. EXAM: CT CHEST WITHOUT CONTRAST TECHNIQUE: Multidetector CT imaging of the chest was performed following the standard protocol without IV contrast. COMPARISON:  Chest radiograph from earlier today. 11/26/2017 chest CT. FINDINGS: Cardiovascular: Normal heart size. No significant pericardial fluid/thickening. Left main and 3 vessel coronary atherosclerosis. Atherosclerotic nonaneurysmal thoracic aorta. Stable prominently dilated main pulmonary artery (4.2 cm diameter). Mediastinum/Nodes: Hypodense 1.0 cm left thyroid lobe nodule. Unremarkable  esophagus. No pathologically enlarged axillary, mediastinal or gross hilar lymph nodes, noting limited sensitivity for the detection of hilar adenopathy on this noncontrast study. Lungs/Pleura: No pneumothorax. No pleural effusion. Chronic bandlike opacities in the dependent right upper lobe and dependent/medial bilateral lower lobes with associated volume loss, compatible with chronic postinfectious/postinflammatory scarring. No acute consolidative airspace disease or lung masses. Sub solid 4 mm right middle lobe pulmonary nodule (series 3/image 80) is stable since 10/24/2017 chest CT. No new significant pulmonary nodules. Upper abdomen: Small hiatal hernia. Stable thickening of the left greater than right adrenal glands without discrete adrenal nodules. Musculoskeletal: No aggressive appearing focal osseous lesions. Stable chronic severe T6 vertebral compression fracture. Marked thoracic spondylosis. Partially visualized right shoulder arthroplasty. Stable subcutaneous upper right back 1.8 cm sebaceous cyst. IMPRESSION: 1. Stable chronic postinfectious/postinflammatory scarring in the dependent right upper lobe and dependent bilateral lower lobes. No superimposed acute consolidative airspace disease. 2. Sub solid 4 mm right middle lobe pulmonary nodule, stable since 10/24/2017 chest CT, probably benign. No follow-up recommended. This recommendation follows the consensus statement: Guidelines for Management of Incidental Pulmonary Nodules Detected on CT Images: From the Fleischner Society 2017; Radiology 2017; 284:228-243. 3. Stable prominently dilated main pulmonary artery, suggesting chronic pulmonary arterial hypertension. 4. Left main and 3 vessel coronary atherosclerosis. 5. Small hiatal hernia. Aortic Atherosclerosis (ICD10-I70.0). Electronically Signed   By: Ilona Sorrel M.D.   On: 03/23/2018 15:36     ____________________________________________   PROCEDURES Procedures  ____________________________________________  DIFFERENTIAL DIAGNOSIS   COPD exacerbation, pneumonia, pneumothorax.  low suspicion ACS PE dissection or pericarditis  CLINICAL IMPRESSION / ASSESSMENT AND PLAN / ED COURSE  Pertinent labs & imaging results that were available during my care of the patient were reviewed by me and considered in my medical decision making (see chart for details).      Clinical Course as of Mar 23 1553  Thu Mar 23, 2018  1215 P/w copd exacerbation. Check cxr. Will give  ceftriaxone, BDs, steroids, mag.    [PS]  7903 Cxr = nad. Labs unremarkable.    [PS]  8333 still symptomatic without improvement after aggressive medical therapy. Repeat exam shows persistent wheezing and prolonged expiratory phase. With underlying severe lung disease, age, fire to improve, I'll add azithromycin IV and discussed with hospitalist for further management. We'll obtain noncontrast CT chest.   [PS]  8329 CT chest negative for infectious process. No pulmonary edema. Plan to admit.   [PS]    Clinical Course User Index [PS] Carrie Mew, MD     ____________________________________________   FINAL CLINICAL IMPRESSION(S) / ED DIAGNOSES    Final diagnoses:  COPD exacerbation ([place])  chronic respiratory failure with hypoxia   ED Discharge Orders    None      Portions of this note were generated with dragon dictation software. Dictation errors may occur despite best attempts at proofreading.    Carrie Mew, MD 03/23/18 1558

## 2018-03-23 NOTE — H&P (Signed)
[facility] [place] NAME: Sara Pierce    MR#:  [id number]  DATE OF BIRTH:  27-Dec-1940  DATE OF ADMISSION:  03/23/2018  PRIMARY CARE PHYSICIAN: Marygrace Drought, MD   REQUESTING/REFERRING PHYSICIAN: Dr. Joni Fears  CHIEF COMPLAINT:   Chief Complaint  Patient presents with  . Chest Pain  . Shortness of Breath  . Cough    HISTORY OF PRESENT ILLNESS:  Sara Pierce  is a 77 y.o. female with a known history of COPD, chronic respiratory failure, obstructive sleep apnea, osteoarthritis, history of CHF, history of previous CVA, hypothyroidism who presented to the hospital due to cough, shortness of breath/wheezing.  Patient says she has not been feeling well for the past 2-3 days progressively getting worse.  She came to see her primary care physician who then referred her to the ER for further evaluation.  Patient says that she has been exposed to quite a bit of pollen as her doors have been kept open at home.  Tried using her home inhalers but was not improving and therefore came to the ER.  In the emergency room patient received multiple nebulizer treatments but continued to have significant wheezing and bronchospasm.  As per the agent she also had a chest x-ray done at her primary care physician's office which was suggestive of pneumonia.  Patient although underwent a CT of her chest and also chest x-ray which was suggestive of COPD but no evidence of acute consolidation or pneumonia.  Given her acute on chronic respiratory failure with hypoxia hospitalist services were contacted for treatment evaluation.  PAST MEDICAL HISTORY:   Past Medical History:  Diagnosis Date  . Arthritis   . Cardiomyopathy ([place])   . Cataract   . Cerebral hemorrhage ([place])   . CHF (congestive heart failure) ([place])   . Chronic kidney disease   . Clotting disorder ([place])   . COPD (chronic obstructive pulmonary disease) ([place])   . Depression   . Diabetes mellitus without  complication ([place])   . Hypertension   . Mixed incontinence   . Obesity   . Sleep apnea   . Thyroid disease   . Urinary frequency   . Vaginal atrophy   . Varicose veins     PAST SURGICAL HISTORY:   Past Surgical History:  Procedure Laterality Date  . APPENDECTOMY    . Cardiac Catherization    . COLONOSCOPY WITH PROPOFOL N/A 04/28/2016   Procedure: COLONOSCOPY WITH PROPOFOL;  Surgeon: Manya Silvas, MD;  Location: [facility] ENDOSCOPY;  Service: Endoscopy;  Laterality: N/A;  . COLONOSCOPY WITH PROPOFOL N/A 04/29/2016   Procedure: COLONOSCOPY WITH PROPOFOL;  Surgeon: Manya Silvas, MD;  Location: [facility] ENDOSCOPY;  Service: Endoscopy;  Laterality: N/A;  . JOINT REPLACEMENT    . STRABISMUS SURGERY    . TONSILLECTOMY    . TUBAL LIGATION      SOCIAL HISTORY:   Social History   Tobacco Use  . Smoking status: Former Smoker    Packs/day: 0.50    Years: 44.00    Pack years: 22.00    Types: Cigarettes  . Smokeless tobacco: Never Used  Substance Use Topics  . Alcohol use: No    FAMILY HISTORY:   Family History  Problem Relation Age of Onset  . Heart attack Father   . Coronary artery disease Father   . Hypertension Father   . Hyperlipidemia Father   . Heart attack Mother   . Hypertension Mother   . Hyperlipidemia  Mother   . Breast cancer Neg Hx     DRUG ALLERGIES:   Allergies  Allergen Reactions  . Aspirin Swelling  . Codeine Itching  . Naprosyn [Naproxen] Other (See Comments)    Per MAR pt allergic  . Other     Elastic Bandages/ Supports Per MAR  . Tape Hives  . Valacyclovir     REVIEW OF SYSTEMS:   Review of Systems  Constitutional: Negative for fever and weight loss.  HENT: Negative for congestion, nosebleeds and tinnitus.   Eyes: Negative for blurred vision, double vision and redness.  Respiratory: Positive for cough, shortness of breath and wheezing. Negative for hemoptysis.   Cardiovascular: Negative for chest pain, orthopnea, leg swelling and PND.   Gastrointestinal: Negative for abdominal pain, diarrhea, melena, nausea and vomiting.  Genitourinary: Negative for dysuria, hematuria and urgency.  Musculoskeletal: Negative for falls and joint pain.  Neurological: Negative for dizziness, tingling, sensory change, focal weakness, seizures, weakness and headaches.  Endo/Heme/Allergies: Negative for polydipsia. Does not bruise/bleed easily.  Psychiatric/Behavioral: Negative for depression and memory loss. The patient is not nervous/anxious.     MEDICATIONS AT HOME:   Prior to Admission medications   Medication Sig Start Date End Date Taking? Authorizing Provider  acetaminophen (TYLENOL) 650 MG CR tablet Take 650 mg by mouth every 8 (eight) hours as needed for pain.   Yes [provider]  albuterol (PROVENTIL HFA;VENTOLIN HFA) 108 (90 Base) MCG/ACT inhaler Inhale 2 puffs into the lungs every 6 (six) hours as needed for wheezing or shortness of breath.   Yes [provider]  albuterol (PROVENTIL) (2.5 MG/3ML) 0.083% nebulizer solution Take 2.5 mg by nebulization every 4 (four) hours as needed for wheezing or shortness of breath.   Yes [provider]  budesonide (PULMICORT) 0.5 MG/2ML nebulizer solution Take 2 mLs (0.5 mg total) 2 (two) times daily by nebulization. 10/28/17  Yes Demetrios Loll, MD  buPROPion (WELLBUTRIN XL) 150 MG 24 hr tablet Take 150 mg by mouth daily.   Yes [provider]  diphenhydrAMINE (BENADRYL) 25 mg capsule Take 25 mg by mouth every 6 (six) hours as needed for itching or allergies.   Yes [provider]  ergocalciferol (VITAMIN D2) 50000 units capsule Take 50,000 Units by mouth every 30 (thirty) days.   Yes [provider]  ferrous sulfate 325 (65 FE) MG tablet Take 1 tablet (325 mg total) 2 (two) times daily with a meal by mouth. Patient taking differently: Take 325 mg by mouth daily.  10/28/17  Yes Demetrios Loll, MD  fluticasone [facility]) 50 MCG/ACT nasal spray Place 1  spray into both nostrils daily.    Yes [provider]  furosemide (LASIX) 40 MG tablet Take 40 mg by mouth 2 (two) times daily.   Yes [provider]  gabapentin (NEURONTIN) 300 MG capsule Take 300 mg by mouth 3 (three) times daily. 03/20/18  Yes [provider]  hydrOXYzine (ATARAX/VISTARIL) 25 MG tablet Take 25 mg by mouth 2 (two) times daily as needed for anxiety.    Yes [provider]  ipratropium-albuterol (DUONEB) 0.5-2.5 (3) MG/3ML SOLN Inhale 3 mLs into the lungs every 6 (six) hours as needed (wheezing).    Yes [provider]  liothyronine (CYTOMEL) 25 MCG tablet Take 25 mcg by mouth daily.    Yes [provider]  magnesium oxide (MAG-OX) 400 MG tablet Take 400 mg by mouth daily.    Yes [provider]  montelukast (SINGULAIR) 10 MG tablet  Take 10 mg by mouth at bedtime.    Yes [provider]  nystatin cream (MYCOSTATIN) Apply 1 application topically 2 (two) times daily.   Yes [provider]  polyethylene glycol powder (GLYCOLAX/MIRALAX) powder Take 17 g by mouth daily.   Yes [provider]  potassium chloride (K-DUR) 10 MEQ tablet Take 10 mEq by mouth 2 (two) times daily.    Yes [provider]  primidone (MYSOLINE) 50 MG tablet Take 50 mg by mouth 2 (two) times daily.    Yes [provider]  senna-docusate (SENOKOT-S) 8.6-50 MG tablet Take 1 tablet by mouth daily.    Yes [provider]  simvastatin (ZOCOR) 40 MG tablet Take 40 mg by mouth at bedtime.    Yes [provider]  spironolactone (ALDACTONE) 25 MG tablet Take 25 mg by mouth daily.   Yes [provider]  tiZANidine (ZANAFLEX) 2 MG tablet Take 2 mg by mouth every 6 (six) hours as needed for muscle spasms.    Yes [provider]  umeclidinium-vilanterol (ANORO ELLIPTA) 62.5-25 MCG/INH AEPB Inhale 1 puff into the lungs daily.   Yes [provider]  zinc oxide ([employer] ZINC OXIDE) 20  % ointment Apply 1 application topically daily as needed for irritation.   Yes [provider]  guaiFENesin-dextromethorphan (ROBITUSSIN DM) 100-10 MG/5ML syrup Take 10 mLs by mouth every 6 (six) hours as needed for cough. Patient not taking: Reported on 10/24/2017 10/04/16   Nicholes Mango, MD  levofloxacin (LEVAQUIN) 500 MG tablet Take 1 tablet (500 mg total) by mouth daily. Patient not taking: Reported on 03/23/2018 01/06/18   Fritzi Mandes, MD  Multiple Vitamin (MULTIVITAMIN WITH MINERALS) TABS tablet Take 1 tablet by mouth daily. Patient not taking: Reported on 03/23/2018 01/06/18   Fritzi Mandes, MD  predniSONE (DELTASONE) 10 MG tablet Take 50 mg daily taper by 10 mg daily than stop Patient not taking: Reported on 03/23/2018 01/06/18   Fritzi Mandes, MD  sodium chloride (OCEAN) 0.65 % SOLN nasal spray Place 2 sprays into both nostrils 3 (three) times daily as needed for congestion. Patient not taking: Reported on 10/24/2017 10/04/16   Nicholes Mango, MD      VITAL SIGNS:  Blood pressure 94/76, pulse (!) 101, temperature 98.4 F (36.9 C), temperature source Oral, resp. rate (!) 21, height 5\' 5"  (1.651 m), weight 96.6 kg (213 lb), SpO2 94 %.  PHYSICAL EXAMINATION:  Physical Exam  GENERAL:  77 y.o.-year-old obese patient lying in bed in mild Resp. Distress and quite anxious.   EYES: Pupils equal, round, reactive to light and accommodation. No scleral icterus. Extraocular muscles intact.  HEENT: Head atraumatic, normocephalic. Oropharynx and nasopharynx clear. No oropharyngeal erythema, moist oral mucosa  NECK:  Supple, no jugular venous distention. No thyroid enlargement, no tenderness.  LUNGS: Good air entry bilaterally, diffuse rhonchi, wheezing bilaterally, negative use of accessory muscles, no dullness to percussion.  CARDIOVASCULAR: S1, S2 RRR. No murmurs, rubs, gallops, clicks.  ABDOMEN: Soft, nontender, nondistended. Bowel sounds present. No organomegaly or mass.  EXTREMITIES: No pedal  edema, cyanosis, or clubbing. + 2 pedal & radial pulses b/l.   NEUROLOGIC: Cranial nerves II through XII are intact. No focal Motor or sensory deficits appreciated b/l PSYCHIATRIC: The patient is alert and oriented x 3.  SKIN: No obvious rash, lesion, or ulcer.   LABORATORY PANEL:   CBC Recent Labs  Lab 03/23/18 1112  WBC 8.8  HGB 12.1  HCT 36.1  PLT 270   ------------------------------------------------------------------------------------------------------------------  Chemistries  Recent Labs  Lab 03/23/18 1112  NA 135  K 4.3  CL 96*  CO2 30  GLUCOSE 106*  BUN 22*  CREATININE 0.83  CALCIUM 9.7  AST 16  ALT 10*  ALKPHOS 86  BILITOT 0.8   ------------------------------------------------------------------------------------------------------------------  Cardiac Enzymes Recent Labs  Lab 03/23/18 1206  TROPONINI <0.03   ------------------------------------------------------------------------------------------------------------------  RADIOLOGY:  Dg Chest 2 View  Result Date: 03/23/2018 CLINICAL DATA:  Congestion, productive cough, pneumonia, shortness of breath, chest pain, history CHF, COPD, cardiomyopathy EXAM: CHEST - 2 VIEW COMPARISON:  01/05/2018 FINDINGS: Slightly rotated to the LEFT. Borderline enlargement of cardiac silhouette. Atherosclerotic calcification aorta. Enlarged central pulmonary arteries question pulmonary arterial hypertension. Bibasilar and RIGHT mid lung atelectasis. No definite acute infiltrate, pleural effusion or pneumothorax. Abnormal RIGHT paramediastinal density superior to the RIGHT hilum at the level of the aortic arch is likely related to the marked compression fracture of the T6 vertebral body with paraspinal calcific debris and spurring as noted on prior CT exams, accentuated by rotation to the LEFT. Bones demineralized with note of a RIGHT shoulder prosthesis. IMPRESSION: Bibasilar atelectasis. No new abnormalities. Electronically Signed    By: Lavonia Dana M.D.   On: 03/23/2018 12:37   Ct Chest Wo Contrast  Result Date: 03/23/2018 CLINICAL DATA:  COPD exacerbation.  Dyspnea.  Productive cough. EXAM: CT CHEST WITHOUT CONTRAST TECHNIQUE: Multidetector CT imaging of the chest was performed following the standard protocol without IV contrast. COMPARISON:  Chest radiograph from earlier today. 11/26/2017 chest CT. FINDINGS: Cardiovascular: Normal heart size. No significant pericardial fluid/thickening. Left main and 3 vessel coronary atherosclerosis. Atherosclerotic nonaneurysmal thoracic aorta. Stable prominently dilated main pulmonary artery (4.2 cm diameter). Mediastinum/Nodes: Hypodense 1.0 cm left thyroid lobe nodule. Unremarkable esophagus. No pathologically enlarged axillary, mediastinal or gross hilar lymph nodes, noting limited sensitivity for the detection of hilar adenopathy on this noncontrast study. Lungs/Pleura: No pneumothorax. No pleural effusion. Chronic bandlike opacities in the dependent right upper lobe and dependent/medial bilateral lower lobes with associated volume loss, compatible with chronic postinfectious/postinflammatory scarring. No acute consolidative airspace disease or lung masses. Sub solid 4 mm right middle lobe pulmonary nodule (series 3/image 80) is stable since 10/24/2017 chest CT. No new significant pulmonary nodules. Upper abdomen: Small hiatal hernia. Stable thickening of the left greater than right adrenal glands without discrete adrenal nodules. Musculoskeletal: No aggressive appearing focal osseous lesions. Stable chronic severe T6 vertebral compression fracture. Marked thoracic spondylosis. Partially visualized right shoulder arthroplasty. Stable subcutaneous upper right back 1.8 cm sebaceous cyst. IMPRESSION: 1. Stable chronic postinfectious/postinflammatory scarring in the dependent right upper lobe and dependent bilateral lower lobes. No superimposed acute consolidative airspace disease. 2. Sub solid 4 mm  right middle lobe pulmonary nodule, stable since 10/24/2017 chest CT, probably benign. No follow-up recommended. This recommendation follows the consensus statement: Guidelines for Management of Incidental Pulmonary Nodules Detected on CT Images: From the Fleischner Society 2017; Radiology 2017; 284:228-243. 3. Stable prominently dilated main pulmonary artery, suggesting chronic pulmonary arterial hypertension. 4. Left main and 3 vessel coronary atherosclerosis. 5. Small hiatal hernia. Aortic Atherosclerosis (ICD10-I70.0). Electronically Signed   By: Ilona Sorrel M.D.   On: 03/23/2018 15:36     IMPRESSION AND PLAN:   77 year old female with past medical history of essential hypertension, diabetes, COPD, chronic respiratory failure, chronic kidney disease stage III, history of previous CVA, osteoarthritis, hypothyroidism who presented to the hospital due to shortness of breath, cough.  1.  COPD exacerbation-this is a cause of patient's worsening respiratory  failure. - Etiology unclear as patient's chest x-ray/CT chest are negative for any acute consolidation or pneumonia. -Suspect to be secondary to bronchitis.  I will check a respiratory panel, empirically place her on Zithromax. - Placed on IV steroids, scheduled duo nebs, Pulmicort nebs -Patient is [place] on chronic oxygen at home.  Next  2.  Hypothyroidism-continue thyroid supplements.  3.  History of chronic diastolic CHF-clinically patient is not in congestive heart failure.  Continue her baseline Lasix, Aldactone.  4.  Hyperlipidemia-continue Zocor.  Next  5.  Anxiety-continue Wellbutrin.  Next  6.  Chronic iron deficiency anemia-continue iron supplements.  7.  Neuropathy-continue gabapentin.    All the records are reviewed and case discussed with ED provider. Management plans discussed with the patient, family and they are in agreement.  CODE STATUS: Full code  TOTAL TIME TAKING CARE OF THIS PATIENT: 45 minutes.     Henreitta Leber M.D on 03/23/2018 at 5:21 PM  Between 7am to 6pm - Pager - [phone]  After 6pm go to [email or web address] - password [email or web address] [facility]  [place] [facility]  Office  [phone]  CC: Primary care physician; Marygrace Drought, MD

## 2018-03-23 NOTE — Progress Notes (Signed)
   [place] at [facility] Day: 0 days Sara Pierce is a 77 y.o. female presenting with Chest Pain; Shortness of Breath; and Cough .   Advance care planning discussed with patient  without additional Family at bedside. All questions in regards to overall condition and expected prognosis answered. The decision was made to continue current code status  CODE STATUS: full Time spent: 16 minutes

## 2018-03-24 ENCOUNTER — Other Ambulatory Visit: Payer: Self-pay

## 2018-03-24 LAB — CBC
HEMATOCRIT: 32.3 % — AB (ref 35.0–47.0)
Hemoglobin: 10.9 g/dL — ABNORMAL LOW (ref 12.0–16.0)
MCH: 31.9 pg (ref 26.0–34.0)
MCHC: 33.9 g/dL (ref 32.0–36.0)
MCV: 94.1 fL (ref 80.0–100.0)
PLATELETS: 237 10*3/uL (ref 150–440)
RBC: 3.43 MIL/uL — ABNORMAL LOW (ref 3.80–5.20)
RDW: 15.9 % — AB (ref 11.5–14.5)
WBC: 5.4 10*3/uL (ref 3.6–11.0)

## 2018-03-24 LAB — BASIC METABOLIC PANEL
Anion gap: 8 (ref 5–15)
BUN: 33 mg/dL — AB (ref 6–20)
CALCIUM: 9.2 mg/dL (ref 8.9–10.3)
CO2: 29 mmol/L (ref 22–32)
Chloride: 97 mmol/L — ABNORMAL LOW (ref 101–111)
Creatinine, Ser: 0.9 mg/dL (ref 0.44–1.00)
GFR calc Af Amer: >60 mL/min (ref >60)
Glucose, Bld: 135 mg/dL — ABNORMAL HIGH (ref 65–99)
POTASSIUM: 4.1 mmol/L (ref 3.5–5.1)
SODIUM: 134 mmol/L — AB (ref 135–145)

## 2018-03-24 LAB — RESPIRATORY PANEL BY PCR
ADENOVIRUS-RVPPCR: NOT DETECTED
Bordetella pertussis: NOT DETECTED
CORONAVIRUS HKU1-RVPPCR: NOT DETECTED
CORONAVIRUS NL63-RVPPCR: NOT DETECTED
CORONAVIRUS OC43-RVPPCR: NOT DETECTED
Chlamydophila pneumoniae: NOT DETECTED
Coronavirus 229E: NOT DETECTED
Influenza A: NOT DETECTED
Influenza B: NOT DETECTED
METAPNEUMOVIRUS-RVPPCR: NOT DETECTED
MYCOPLASMA PNEUMONIAE-RVPPCR: NOT DETECTED
PARAINFLUENZA VIRUS 1-RVPPCR: NOT DETECTED
PARAINFLUENZA VIRUS 2-RVPPCR: NOT DETECTED
Parainfluenza Virus 3: NOT DETECTED
Parainfluenza Virus 4: NOT DETECTED
Respiratory Syncytial Virus: NOT DETECTED
Rhinovirus / Enterovirus: NOT DETECTED

## 2018-03-24 LAB — MRSA PCR SCREENING: MRSA by PCR: POSITIVE — AB

## 2018-03-24 MED ORDER — VANCOMYCIN HCL IN DEXTROSE 1-5 GM/200ML-% IV SOLN
1000.0000 mg | INTRAVENOUS | Status: DC
  Administered 2018-03-24: 1000 mg via INTRAVENOUS
  Filled 2018-03-24 (×2): qty 200

## 2018-03-24 MED ORDER — VANCOMYCIN HCL IN DEXTROSE 1-5 GM/200ML-% IV SOLN
1000.0000 mg | Freq: Once | INTRAVENOUS | Status: AC
  Administered 2018-03-24: 1000 mg via INTRAVENOUS
  Filled 2018-03-24: qty 200

## 2018-03-24 MED ORDER — MUPIROCIN 2 % EX OINT
1.0000 "application " | TOPICAL_OINTMENT | Freq: Two times a day (BID) | CUTANEOUS | Status: DC
  Administered 2018-03-24 – 2018-03-27 (×7): 1 via NASAL
  Filled 2018-03-24: qty 22

## 2018-03-24 MED ORDER — CHLORHEXIDINE GLUCONATE CLOTH 2 % EX PADS
6.0000 | MEDICATED_PAD | Freq: Every day | CUTANEOUS | Status: DC
  Administered 2018-03-24 – 2018-03-27 (×4): 6 via TOPICAL

## 2018-03-24 NOTE — Progress Notes (Signed)
[facility]  Patient Status: Active  [facility] is providing the following services: SN, PT, OT  (Informed CM Isaias Cowman that patient may benefit from NIV if discharges to home)  If patient discharges after hours, please call [phone].   Sara Pierce 03/24/2018, 4:43 PM

## 2018-03-24 NOTE — Progress Notes (Signed)
Pharmacy Antibiotic Note  Sara Pierce is a 77 y.o. female admitted on 03/23/2018 with bronchitis.  Pharmacy has been consulted for vancomycin dosing.  Plan: Vancomycin 1 gm IV x 1 followed in approximately 6 hours (stacked dosing) by vancomycin 1 gm IV Q18H, predicted trough 11 mcg/mL. Pharmacy will continue to follow and adjust as needed to maintain trough 10 to 15 mcg/mL.   Vd 51.4 L, Ke 0.057 hr-1, T1/2 12.1 hr  Height: 5\' 5"  (165.1 cm) Weight: 217 lb 9.5 oz (98.7 kg) IBW/kg (Calculated) : 57  Temp (24hrs), Avg:98.1 F (36.7 C), Min:97.5 F (36.4 C), Max:98.9 F (37.2 C)  Recent Labs  Lab 03/23/18 1112 03/24/18 0707  WBC 8.8 5.4  CREATININE 0.83 0.90    Estimated Creatinine Clearance: 63.8 mL/min (by C-G formula based on SCr of 0.9 mg/dL).    Allergies  Allergen Reactions  . Aspirin Swelling  . Codeine Itching  . Naprosyn [Naproxen] Other (See Comments)    Per MAR pt allergic  . Other     Elastic Bandages/ Supports Per MAR  . Tape Hives  . Valacyclovir     Antimicrobials this admission: Azithromycin po  Dose adjustments this admission:   Microbiology results:  BCx:   UCx:    Sputum:   4/11 MRSA PCR: positive  Thank you for allowing pharmacy to be a part of this patient's care.  Laural Benes, Pharm.D., BCPS Clinical Pharmacist 03/24/2018 1:36 PM

## 2018-03-24 NOTE — Care Management (Signed)
Patient admitted with COPD.  Patient Lives with husband. Sees Dr. Marygrace Drought at [facility] in [place]. Followed by [facility] for skilled nursing, physical therapy, and OT.  [facility] requested COPD protocol, CHF protocol, and ABG to determine if patient would be appropriate for NIV. Patient has 3 L of O2 Chronic at home.  RNCM following

## 2018-03-24 NOTE — Progress Notes (Signed)
[place] [facility] [facility] [place] NAME: Sara Pierce    MR#:  [id number]  DATE OF BIRTH:  [birth year]  SUBJECTIVE:  CHIEF COMPLAINT:   Chief Complaint  Patient presents with  . Chest Pain  . Shortness of Breath  . Cough   Came with cough and wheezing.  REVIEW OF SYSTEMS:  CONSTITUTIONAL: No fever, Have fatigue or weakness.  EYES: No blurred or double vision.  EARS, NOSE, AND THROAT: No tinnitus or ear pain.  RESPIRATORY: No cough, have shortness of breath, wheezing or hemoptysis.  CARDIOVASCULAR: No chest pain, orthopnea, edema.  GASTROINTESTINAL: No nausea, vomiting, diarrhea or abdominal pain.  GENITOURINARY: No dysuria, hematuria.  ENDOCRINE: No polyuria, nocturia,  HEMATOLOGY: No anemia, easy bruising or bleeding SKIN: No rash or lesion. MUSCULOSKELETAL: No joint pain or arthritis.   NEUROLOGIC: No tingling, numbness,b/l LL weakness.  PSYCHIATRY: No anxiety or depression.   ROS  DRUG ALLERGIES:   Allergies  Allergen Reactions  . Aspirin Swelling  . Codeine Itching  . Naprosyn [Naproxen] Other (See Comments)    Per MAR pt allergic  . Other     Elastic Bandages/ Supports Per MAR  . Tape Hives  . Valacyclovir     VITALS:  Blood pressure (!) 124/59, pulse 74, temperature (!) 97.5 F (36.4 C), temperature source Oral, resp. rate 16, height 5\' 5"  (1.651 m), weight 96.7 kg (213 lb 1.6 oz), SpO2 98 %.  PHYSICAL EXAMINATION:  GENERAL:  77 y.o.-year-old patient lying in the bed with no acute distress.  EYES: Pupils equal, round, reactive to light and accommodation. No scleral icterus. Extraocular muscles intact.  HEENT: Head atraumatic, normocephalic. Oropharynx and nasopharynx clear.  NECK:  Supple, no jugular venous distention. No thyroid enlargement, no tenderness.  LUNGS: Normal breath sounds bilaterally, no wheezing, have crepitation. No use of accessory muscles of respiration.  CARDIOVASCULAR: S1, S2 normal. No murmurs, rubs, or  gallops.  ABDOMEN: Soft, nontender, nondistended. Bowel sounds present. No organomegaly or mass.  EXTREMITIES: No pedal edema, cyanosis, or clubbing.  NEUROLOGIC: Cranial nerves II through XII are intact. Muscle strength 4/5 in upper and 2/5 in lower extremities. Sensation intact. Gait not checked.  PSYCHIATRIC: The patient is alert and oriented x 3.  SKIN: No obvious rash, lesion, or ulcer.   Physical Exam LABORATORY PANEL:   CBC Recent Labs  Lab 03/24/18 0707  WBC 5.4  HGB 10.9*  HCT 32.3*  PLT 237   ------------------------------------------------------------------------------------------------------------------  Chemistries  Recent Labs  Lab 03/23/18 1112 03/24/18 0707  NA 135 134*  K 4.3 4.1  CL 96* 97*  CO2 30 29  GLUCOSE 106* 135*  BUN 22* 33*  CREATININE 0.83 0.90  CALCIUM 9.7 9.2  AST 16  --   ALT 10*  --   ALKPHOS 86  --   BILITOT 0.8  --    ------------------------------------------------------------------------------------------------------------------  Cardiac Enzymes Recent Labs  Lab 03/23/18 1206  TROPONINI <0.03   ------------------------------------------------------------------------------------------------------------------  RADIOLOGY:  Dg Chest 2 View  Result Date: 03/23/2018 CLINICAL DATA:  Congestion, productive cough, pneumonia, shortness of breath, chest pain, history CHF, COPD, cardiomyopathy EXAM: CHEST - 2 VIEW COMPARISON:  01/05/2018 FINDINGS: Slightly rotated to the LEFT. Borderline enlargement of cardiac silhouette. Atherosclerotic calcification aorta. Enlarged central pulmonary arteries question pulmonary arterial hypertension. Bibasilar and RIGHT mid lung atelectasis. No definite acute infiltrate, pleural effusion or pneumothorax. Abnormal RIGHT paramediastinal density superior to the RIGHT hilum at the level of the aortic arch is likely related to  the marked compression fracture of the T6 vertebral body with paraspinal calcific  debris and spurring as noted on prior CT exams, accentuated by rotation to the LEFT. Bones demineralized with note of a RIGHT shoulder prosthesis. IMPRESSION: Bibasilar atelectasis. No new abnormalities. Electronically Signed   By: Lavonia Dana M.D.   On: 03/23/2018 12:37   Ct Chest Wo Contrast  Result Date: 03/23/2018 CLINICAL DATA:  COPD exacerbation.  Dyspnea.  Productive cough. EXAM: CT CHEST WITHOUT CONTRAST TECHNIQUE: Multidetector CT imaging of the chest was performed following the standard protocol without IV contrast. COMPARISON:  Chest radiograph from earlier today. 11/26/2017 chest CT. FINDINGS: Cardiovascular: Normal heart size. No significant pericardial fluid/thickening. Left main and 3 vessel coronary atherosclerosis. Atherosclerotic nonaneurysmal thoracic aorta. Stable prominently dilated main pulmonary artery (4.2 cm diameter). Mediastinum/Nodes: Hypodense 1.0 cm left thyroid lobe nodule. Unremarkable esophagus. No pathologically enlarged axillary, mediastinal or gross hilar lymph nodes, noting limited sensitivity for the detection of hilar adenopathy on this noncontrast study. Lungs/Pleura: No pneumothorax. No pleural effusion. Chronic bandlike opacities in the dependent right upper lobe and dependent/medial bilateral lower lobes with associated volume loss, compatible with chronic postinfectious/postinflammatory scarring. No acute consolidative airspace disease or lung masses. Sub solid 4 mm right middle lobe pulmonary nodule (series 3/image 80) is stable since 10/24/2017 chest CT. No new significant pulmonary nodules. Upper abdomen: Small hiatal hernia. Stable thickening of the left greater than right adrenal glands without discrete adrenal nodules. Musculoskeletal: No aggressive appearing focal osseous lesions. Stable chronic severe T6 vertebral compression fracture. Marked thoracic spondylosis. Partially visualized right shoulder arthroplasty. Stable subcutaneous upper right back 1.8 cm  sebaceous cyst. IMPRESSION: 1. Stable chronic postinfectious/postinflammatory scarring in the dependent right upper lobe and dependent bilateral lower lobes. No superimposed acute consolidative airspace disease. 2. Sub solid 4 mm right middle lobe pulmonary nodule, stable since 10/24/2017 chest CT, probably benign. No follow-up recommended. This recommendation follows the consensus statement: Guidelines for Management of Incidental Pulmonary Nodules Detected on CT Images: From the Fleischner Society 2017; Radiology 2017; 284:228-243. 3. Stable prominently dilated main pulmonary artery, suggesting chronic pulmonary arterial hypertension. 4. Left main and 3 vessel coronary atherosclerosis. 5. Small hiatal hernia. Aortic Atherosclerosis (ICD10-I70.0). Electronically Signed   By: Ilona Sorrel M.D.   On: 03/23/2018 15:36    ASSESSMENT AND PLAN:   Active Problems:   COPD exacerbation ([place])  77 year old female with past medical history of essential hypertension, diabetes, COPD, chronic respiratory failure, chronic kidney disease stage III, history of previous CVA, osteoarthritis, hypothyroidism who presented to the hospital due to shortness of breath, cough.  1.  COPD exacerbation-this is a cause of patient's worsening respiratory failure. - Etiology unclear as patient's chest x-ray / CT chest are negative for any acute consolidation or pneumonia. -Suspect to be secondary to bronchitis. negative respiratory panel, empirically placed her on Zithromax. - Placed on IV steroids, scheduled duo nebs, Pulmicort nebs -Patient is on chronic oxygen at home.   - MRSA screen positive, start vanc, may switch to doxy tomorrow, stop azithromycin.  2.  Hypothyroidism-continue thyroid supplements.  3.  History of chronic diastolic CHF-clinically patient is not in congestive heart failure.  Continue her baseline Lasix, Aldactone.  4.  Hyperlipidemia-continue Zocor.  Next  5.  Anxiety-continue Wellbutrin.   Next  6.  Chronic iron deficiency anemia-continue iron supplements.  7.  Neuropathy-continue gabapentin.   All the records are reviewed and case discussed with Care Management/Social Workerr. Management plans discussed with the patient, family and they are in  agreement.  CODE STATUS: Full.  TOTAL TIME TAKING CARE OF THIS PATIENT: 35 minutes.   Get PT, still have wheezing.  POSSIBLE D/C IN 1-2 DAYS, DEPENDING ON CLINICAL CONDITION.   Vaughan Basta M.D on 03/24/2018   Between 7am to 6pm - Pager - [phone]  After 6pm go to [email or web address] - password [email or web address] [facility]  Office  [phone]  CC: Primary care physician; Marygrace Drought, MD  Note: This dictation was prepared with Dragon dictation along with smaller phrase technology. Any transcriptional errors that result from this process are unintentional.

## 2018-03-25 MED ORDER — DOXYCYCLINE HYCLATE 100 MG PO TABS
100.0000 mg | ORAL_TABLET | Freq: Two times a day (BID) | ORAL | Status: DC
  Administered 2018-03-25 – 2018-03-27 (×5): 100 mg via ORAL
  Filled 2018-03-25 (×5): qty 1
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
## 2018-03-25 NOTE — Progress Notes (Signed)
[place] [facility] [place] NAME: Sara Pierce    MR#:  [id number]  DATE OF BIRTH:  Jan 18, 1941  SUBJECTIVE:  CHIEF COMPLAINT:   Chief Complaint  Patient presents with  . Chest Pain  . Shortness of Breath  . Cough   Came with cough and wheezing.  Feels slightly better, still have significant wheezing.  REVIEW OF SYSTEMS:  CONSTITUTIONAL: No fever, Have fatigue or weakness.  EYES: No blurred or double vision.  EARS, NOSE, AND THROAT: No tinnitus or ear pain.  RESPIRATORY: No cough, have shortness of breath, wheezing or hemoptysis.  CARDIOVASCULAR: No chest pain, orthopnea, edema.  GASTROINTESTINAL: No nausea, vomiting, diarrhea or abdominal pain.  GENITOURINARY: No dysuria, hematuria.  ENDOCRINE: No polyuria, nocturia,  HEMATOLOGY: No anemia, easy bruising or bleeding SKIN: No rash or lesion. MUSCULOSKELETAL: No joint pain or arthritis.   NEUROLOGIC: No tingling, numbness,b/l LL weakness.  PSYCHIATRY: No anxiety or depression.   ROS  DRUG ALLERGIES:   Allergies  Allergen Reactions  . Aspirin Swelling  . Codeine Itching  . Naprosyn [Naproxen] Other (See Comments)    Per MAR pt allergic  . Other     Elastic Bandages/ Supports Per MAR  . Tape Hives  . Valacyclovir     VITALS:  Blood pressure (!) 124/55, pulse 74, temperature 97.8 F (36.6 C), temperature source Oral, resp. rate 18, height 5\' 5"  (1.651 m), weight 96.3 kg (212 lb 4.8 oz), SpO2 100 %.  PHYSICAL EXAMINATION:  GENERAL:  77 y.o.-year-old patient lying in the bed with no acute distress.  EYES: Pupils equal, round, reactive to light and accommodation. No scleral icterus. Extraocular muscles intact.  HEENT: Head atraumatic, normocephalic. Oropharynx and nasopharynx clear.  NECK:  Supple, no jugular venous distention. No thyroid enlargement, no tenderness.  LUNGS: Normal breath sounds bilaterally, no wheezing, have crepitation. No use of accessory muscles of respiration.   CARDIOVASCULAR: S1, S2 normal. No murmurs, rubs, or gallops.  ABDOMEN: Soft, nontender, nondistended. Bowel sounds present. No organomegaly or mass.  EXTREMITIES: No pedal edema, cyanosis, or clubbing.  NEUROLOGIC: Cranial nerves II through XII are intact. Muscle strength 4/5 in upper and 2/5 in lower extremities. Sensation intact. Gait not checked.  PSYCHIATRIC: The patient is alert and oriented x 3.  SKIN: No obvious rash, lesion, or ulcer.   Physical Exam LABORATORY PANEL:   CBC Recent Labs  Lab 03/24/18 0707  WBC 5.4  HGB 10.9*  HCT 32.3*  PLT 237   ------------------------------------------------------------------------------------------------------------------  Chemistries  Recent Labs  Lab 03/23/18 1112 03/24/18 0707  NA 135 134*  K 4.3 4.1  CL 96* 97*  CO2 30 29  GLUCOSE 106* 135*  BUN 22* 33*  CREATININE 0.83 0.90  CALCIUM 9.7 9.2  AST 16  --   ALT 10*  --   ALKPHOS 86  --   BILITOT 0.8  --    ------------------------------------------------------------------------------------------------------------------  Cardiac Enzymes Recent Labs  Lab 03/23/18 1206  TROPONINI <0.03   ------------------------------------------------------------------------------------------------------------------  RADIOLOGY:  Ct Chest Wo Contrast  Result Date: 03/23/2018 CLINICAL DATA:  COPD exacerbation.  Dyspnea.  Productive cough. EXAM: CT CHEST WITHOUT CONTRAST TECHNIQUE: Multidetector CT imaging of the chest was performed following the standard protocol without IV contrast. COMPARISON:  Chest radiograph from earlier today. 11/26/2017 chest CT. FINDINGS: Cardiovascular: Normal heart size. No significant pericardial fluid/thickening. Left main and 3 vessel coronary atherosclerosis. Atherosclerotic nonaneurysmal thoracic aorta. Stable prominently dilated main pulmonary artery (4.2 cm diameter). Mediastinum/Nodes: Hypodense 1.0  cm left thyroid lobe nodule. Unremarkable esophagus.  No pathologically enlarged axillary, mediastinal or gross hilar lymph nodes, noting limited sensitivity for the detection of hilar adenopathy on this noncontrast study. Lungs/Pleura: No pneumothorax. No pleural effusion. Chronic bandlike opacities in the dependent right upper lobe and dependent/medial bilateral lower lobes with associated volume loss, compatible with chronic postinfectious/postinflammatory scarring. No acute consolidative airspace disease or lung masses. Sub solid 4 mm right middle lobe pulmonary nodule (series 3/image 80) is stable since 10/24/2017 chest CT. No new significant pulmonary nodules. Upper abdomen: Small hiatal hernia. Stable thickening of the left greater than right adrenal glands without discrete adrenal nodules. Musculoskeletal: No aggressive appearing focal osseous lesions. Stable chronic severe T6 vertebral compression fracture. Marked thoracic spondylosis. Partially visualized right shoulder arthroplasty. Stable subcutaneous upper right back 1.8 cm sebaceous cyst. IMPRESSION: 1. Stable chronic postinfectious/postinflammatory scarring in the dependent right upper lobe and dependent bilateral lower lobes. No superimposed acute consolidative airspace disease. 2. Sub solid 4 mm right middle lobe pulmonary nodule, stable since 10/24/2017 chest CT, probably benign. No follow-up recommended. This recommendation follows the consensus statement: Guidelines for Management of Incidental Pulmonary Nodules Detected on CT Images: From the Fleischner Society 2017; Radiology 2017; 284:228-243. 3. Stable prominently dilated main pulmonary artery, suggesting chronic pulmonary arterial hypertension. 4. Left main and 3 vessel coronary atherosclerosis. 5. Small hiatal hernia. Aortic Atherosclerosis (ICD10-I70.0). Electronically Signed   By: Ilona Sorrel M.D.   On: 03/23/2018 15:36    ASSESSMENT AND PLAN:   Active Problems:   COPD exacerbation ([place])  77 year old female with past medical history  of essential hypertension, diabetes, COPD, chronic respiratory failure, chronic kidney disease stage III, history of previous CVA, osteoarthritis, hypothyroidism who presented to the hospital due to shortness of breath, cough.  1.  COPD exacerbation-this is a cause of patient's worsening respiratory failure. - Etiology unclear as patient's chest x-ray / CT chest are negative for any acute consolidation or pneumonia. -Suspect to be secondary to bronchitis. negative respiratory panel, empirically placed her on Zithromax. - Placed on IV steroids, scheduled duo nebs, Pulmicort nebs -Patient is on chronic oxygen at home.   - MRSA screen positive, start vanc, switch to doxy oral, stop azithromycin.  2.  Hypothyroidism-continue thyroid supplements.  3.  History of chronic diastolic CHF-clinically patient is not in congestive heart failure.  Continue her baseline Lasix, Aldactone.  4.  Hyperlipidemia-continue Zocor.  Next  5.  Anxiety-continue Wellbutrin.  Next  6.  Chronic iron deficiency anemia-continue iron supplements.  7.  Neuropathy-continue gabapentin.   All the records are reviewed and case discussed with Care Management/Social Workerr. Management plans discussed with the patient, family and they are in agreement.  CODE STATUS: Full.  TOTAL TIME TAKING CARE OF THIS PATIENT: 35 minutes.   Get PT, still have wheezing.  POSSIBLE D/C IN 1-2 DAYS, DEPENDING ON CLINICAL CONDITION.   Vaughan Basta M.D on 03/25/2018   Between 7am to 6pm - Pager - [phone]  After 6pm go to [email or web address] - password [email or web address] [facility]  Office  [phone]  CC: Primary care physician; Marygrace Drought, MD  Note: This dictation was prepared with Dragon dictation along with smaller phrase technology. Any transcriptional errors that result from this process are unintentional.

## 2018-03-26 MED ORDER — PREDNISONE 50 MG PO TABS
50.0000 mg | ORAL_TABLET | Freq: Every day | ORAL | Status: DC
  Administered 2018-03-27: 50 mg via ORAL
  Filled 2018-03-26: qty 1

## 2018-03-26 NOTE — Progress Notes (Signed)
[facility] NAME: Klani Caridi    MR#:  [id number]  DATE OF BIRTH:  1941-04-15  SUBJECTIVE:   Came with cough and wheezing.  Feels slightly better, still have significant wheezing.  REVIEW OF SYSTEMS:  CONSTITUTIONAL: No fever, Have fatigue or weakness.  EYES: No blurred or double vision.  EARS, NOSE, AND THROAT: No tinnitus or ear pain.  RESPIRATORY: No cough, have shortness of breath, wheezing or hemoptysis.  CARDIOVASCULAR: No chest pain, orthopnea, edema.  GASTROINTESTINAL: No nausea, vomiting, diarrhea or abdominal pain.  GENITOURINARY: No dysuria, hematuria.  ENDOCRINE: No polyuria, nocturia,  HEMATOLOGY: No anemia, easy bruising or bleeding SKIN: No rash or lesion. MUSCULOSKELETAL: No joint pain or arthritis.   NEUROLOGIC: No tingling, numbness,b/l LL weakness.  PSYCHIATRY: No anxiety or depression.   Review of Systems  Constitutional: Negative for chills, fever and weight loss.  HENT: Negative for ear discharge, ear pain and nosebleeds.   Eyes: Negative for blurred vision, pain and discharge.  Respiratory: Positive for shortness of breath. Negative for sputum production, wheezing and stridor.   Cardiovascular: Negative for chest pain, palpitations, orthopnea and PND.  Gastrointestinal: Negative for abdominal pain, diarrhea, nausea and vomiting.  Genitourinary: Negative for frequency and urgency.  Musculoskeletal: Negative for back pain and joint pain.  Neurological: Negative for sensory change, speech change, focal weakness and weakness.  Psychiatric/Behavioral: Negative for depression and hallucinations. The patient is not nervous/anxious.     DRUG ALLERGIES:   Allergies  Allergen Reactions  . Aspirin Swelling  . Codeine Itching  . Naprosyn [Naproxen] Other (See Comments)    Per MAR pt allergic  . Other     Elastic Bandages/ Supports Per MAR  . Tape Hives  . Valacyclovir     VITALS:  Blood pressure (!)  112/58, pulse 87, temperature 97.8 F (36.6 C), temperature source Oral, resp. rate 16, height 5\' 5"  (1.651 m), weight 99.3 kg (218 lb 14.7 oz), SpO2 99 %.  PHYSICAL EXAMINATION:  GENERAL:  77 y.o.-year-old patient lying in the bed with no acute distress. Morbidly obese EYES: Pupils equal, round, reactive to light and accommodation. No scleral icterus. Extraocular muscles intact.  HEENT: Head atraumatic, normocephalic. Oropharynx and nasopharynx clear.  NECK:  Supple, no jugular venous distention. No thyroid enlargement, no tenderness.  LUNGS: Normal breath sounds bilaterally, no wheezing, have crepitation. No use of accessory muscles of respiration.  CARDIOVASCULAR: S1, S2 normal. No murmurs, rubs, or gallops.  ABDOMEN: Soft, nontender, nondistended. Bowel sounds present. No organomegaly or mass.  EXTREMITIES: No pedal edema, cyanosis, or clubbing.  NEUROLOGIC: Cranial nerves II through XII are intact. Muscle strength 4/5 in upper and 2/5 in lower extremities. Sensation intact. Gait not checked.  PSYCHIATRIC: The patient is alert and oriented x 3.  SKIN: No obvious rash, lesion, or ulcer.   Physical Exam LABORATORY PANEL:   CBC Recent Labs  Lab 03/24/18 0707  WBC 5.4  HGB 10.9*  HCT 32.3*  PLT 237   ------------------------------------------------------------------------------------------------------------------  Chemistries  Recent Labs  Lab 03/23/18 1112 03/24/18 0707  NA 135 134*  K 4.3 4.1  CL 96* 97*  CO2 30 29  GLUCOSE 106* 135*  BUN 22* 33*  CREATININE 0.83 0.90  CALCIUM 9.7 9.2  AST 16  --   ALT 10*  --   ALKPHOS 86  --   BILITOT 0.8  --    ------------------------------------------------------------------------------------------------------------------  Cardiac Enzymes Recent Labs  Lab 03/23/18 1206  TROPONINI <0.03   ------------------------------------------------------------------------------------------------------------------  RADIOLOGY:  No  results found.  ASSESSMENT AND PLAN:   Active Problems:   COPD exacerbation ([place])  77 year old female with past medical history of essential hypertension, diabetes, COPD, chronic respiratory failure, chronic kidney disease stage III, history of previous CVA, osteoarthritis, hypothyroidism who presented to the hospital due to shortness of breath, cough.  1.  COPD exacerbation-this is a cause of patient's worsening respiratory failure. - Etiology unclear as patient's chest x-ray / CT chest are negative for any acute consolidation or pneumonia. -Suspect to be secondary to bronchitis. negative respiratory panel, empirically placed her on Zithromax. - Placed on IV steroids, scheduled duo nebs, Pulmicort nebs--- change to oral steroids -Patient is on chronic oxygen at home.   - MRSA screen positive,switch to doxy oral  2.  Hypothyroidism-continue thyroid supplements.  3.  History of chronic diastolic CHF-clinically patient is not in congestive heart failure.  - Continue her baseline Lasix, Aldactone.  4.  Hyperlipidemia-continue Zocor.  Next  5.  Anxiety-continue Wellbutrin.  Next  6.  Chronic iron deficiency anemia-continue iron supplements.  7.  Neuropathy-continue gabapentin.  Physical therapy's outpatient. Recommends rehab. Per patient she has not walked in a long time. I encouraged her to do physical therapy intensive at rehab to see any benefits. Patient states she is motivated to do therapy. Will discharge to rehab tomorrow.  All the records are reviewed and case discussed with Care Management/Social Workerr. Management plans discussed with the patient, family and they are in agreement.  CODE STATUS: Full.  TOTAL TIME TAKING CARE OF THIS PATIENT: 25 minutes.    POSSIBLE D/C IN 1-2 DAYS, DEPENDING ON CLINICAL CONDITION.   Fritzi Mandes M.D on 03/26/2018   Between 7am to 6pm - Pager - [phone]  After 6pm go to [email or web address] - password [email or web address]  Office  [phone]  CC: Primary care physician; Marygrace Drought, MD  Note: This dictation was prepared with Dragon dictation along with smaller phrase technology. Any transcriptional errors that result from this process are unintentional.

## 2018-03-26 NOTE — NC FL2 (Signed)
[place] LEVEL OF CARE SCREENING TOOL     IDENTIFICATION  Patient Name: Sara Pierce Birthdate: [birth year] Sex: female Admission Date (Current Location): 03/23/2018  [place] and [place] Number:  [name] [id number] [place] and Address:  [facility], [address], [place], [place] [address]      Provider Number: [id number]  Attending Physician Name and Address:  Fritzi Mandes, MD  Relative Name and Phone Number:  Margaux Engen (Spouse) [phone]    Current Level of Care: Hospital Recommended Level of Care: [place] Prior Approval Number:    Date Approved/Denied:   PASRR Number: [id number]  Discharge Plan: SNF    Current Diagnoses: Patient Active Problem List   Diagnosis Date Noted  . COPD exacerbation ([place]) 03/23/2018  . COPD (chronic obstructive pulmonary disease) ([place]) 01/04/2018  . Pressure injury of skin 10/25/2017  . COPD with exacerbation ([place]) 10/24/2017  . HCAP (healthcare-associated pneumonia) 08/19/2017  . CAP (community acquired pneumonia) 10/03/2016  . Sepsis ([place]) 11/15/2015  . Absolute anemia 03/15/2015  . Anxiety 03/15/2015  . Arthritis 03/15/2015  . Cardiomyopathy, secondary ([place]) 03/15/2015  . Cataract 03/15/2015  . Brain bleed ([place]) 03/15/2015  . Chronic constipation 03/15/2015  . Chronic kidney failure 03/15/2015  . Blood clotting disorder ([place]) 03/15/2015  . CAFL (chronic airflow limitation) ([place]) 03/15/2015  . Clinical depression 03/15/2015  . Diabetes ([place]) 03/15/2015  . H/O varicella 03/15/2015  . BP (high blood pressure) 03/15/2015  . Mixed incontinence 03/15/2015  . 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 Adiposity 03/15/2015  . Apnea, sleep 03/15/2015  . Disease of thyroid gland 03/15/2015  . FOM (frequency of micturition) 03/15/2015  . 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 Candida vaginitis 03/15/2015  . Abnormal ECG 03/15/2015     Orientation RESPIRATION BLADDER Height & Weight     Self, Time, Situation, Place  O2(Chronic o2 3L) Incontinent Weight: 218 lb 14.7 oz (99.3 kg) Height:  5\' 5"  (165.1 cm)  BEHAVIORAL SYMPTOMS/MOOD NEUROLOGICAL BOWEL NUTRITION STATUS      Incontinent Diet(Heart healthy)  AMBULATORY STATUS COMMUNICATION OF NEEDS Skin   Total Care Verbally Normal                       Personal Care Assistance Level of Assistance  Bathing, Feeding, Dressing Bathing Assistance: Maximum assistance Feeding assistance: Independent Dressing Assistance: Maximum assistance     Functional Limitations Info             SPECIAL CARE FACTORS FREQUENCY  PT (By licensed PT)     PT Frequency: Up to 5X per week              Contractures Contractures Info: Not present    Additional Factors Info  Code Status, Allergies, Psychotropic Code Status Info: Full Allergies Info: Aspirin, Codeine, Naprosyn Naproxen, Other, Tape, Valacyclovir Psychotropic Info: Wellbutrin         Current Medications (03/26/2018):  This is the current hospital active medication list Current Facility-Administered Medications  Medication Dose Route Frequency Provider Last Rate Last Dose  . acetaminophen (TYLENOL) tablet 650 mg  650 mg Oral Q6H PRN Henreitta Leber, MD   650 mg at 03/26/18 1036   Or  . acetaminophen (TYLENOL) suppository 650 mg  650 mg Rectal Q6H PRN Sainani, Belia Heman, MD      . albuterol (PROVENTIL) (2.5 MG/3ML) 0.083% nebulizer solution 2.5 mg  2.5 mg Nebulization Q4H PRN Henreitta Leber, MD      .  albuterol (PROVENTIL) (2.5 MG/3ML) 0.083% nebulizer solution 3 mL  3 mL Inhalation Q6H PRN Henreitta Leber, MD      . budesonide (PULMICORT) nebulizer solution 0.5 mg  0.5 mg Nebulization BID Henreitta Leber, MD   0.5 mg at 03/26/18 0945  . buPROPion (WELLBUTRIN XL) 24 hr tablet 150 mg  150 mg Oral Daily Henreitta Leber, MD   150 mg at 03/26/18 1036  . Chlorhexidine Gluconate Cloth 2 % PADS 6 each  6  each Topical Q0600 Vaughan Basta, MD   6 each at 03/26/18 0612  . diphenhydrAMINE (BENADRYL) capsule 25 mg  25 mg Oral Q6H PRN Henreitta Leber, MD      . doxycycline (VIBRA-TABS) tablet 100 mg  100 mg Oral Q12H Vaughan Basta, MD   100 mg at 03/26/18 1035  . enoxaparin (LOVENOX) injection 40 mg  40 mg Subcutaneous Q24H Henreitta Leber, MD   40 mg at 03/25/18 2128  . ferrous sulfate tablet 325 mg  325 mg Oral BID WC Henreitta Leber, MD   325 mg at 03/26/18 6387  . furosemide (LASIX) tablet 40 mg  40 mg Oral BID Henreitta Leber, MD   40 mg at 03/26/18 5643  . gabapentin (NEURONTIN) capsule 300 mg  300 mg Oral TID Henreitta Leber, MD   300 mg at 03/26/18 1036  . guaiFENesin-dextromethorphan (ROBITUSSIN DM) 100-10 MG/5ML syrup 5 mL  5 mL Oral Q4H PRN Henreitta Leber, MD   5 mL at 03/23/18 2346  . hydrOXYzine (ATARAX/VISTARIL) tablet 25 mg  25 mg Oral BID PRN Henreitta Leber, MD      . ipratropium-albuterol (DUONEB) 0.5-2.5 (3) MG/3ML nebulizer solution 3 mL  3 mL Nebulization Q6H Henreitta Leber, MD   3 mL at 03/26/18 0945  . liothyronine (CYTOMEL) tablet 25 mcg  25 mcg Oral Daily Henreitta Leber, MD   25 mcg at 03/26/18 1036  . magnesium oxide (MAG-OX) tablet 400 mg  400 mg Oral Daily Henreitta Leber, MD   400 mg at 03/26/18 1036  . methylPREDNISolone sodium succinate (SOLU-MEDROL) 40 mg/mL injection 40 mg  40 mg Intravenous Q6H Henreitta Leber, MD   40 mg at 03/26/18 0608  . montelukast (SINGULAIR) tablet 10 mg  10 mg Oral QHS Henreitta Leber, MD   10 mg at 03/25/18 2127  . mupirocin ointment (BACTROBAN) 2 % 1 application  1 application Nasal BID Vaughan Basta, MD   1 application at 32/95/18 1036  . ondansetron (ZOFRAN) tablet 4 mg  4 mg Oral Q6H PRN Henreitta Leber, MD       Or  . ondansetron (ZOFRAN) injection 4 mg  4 mg Intravenous Q6H PRN Sainani, Belia Heman, MD      . polyethylene glycol powder (GLYCOLAX/MIRALAX) container 17 g  17 g Oral Daily Henreitta Leber, MD   17 g at 03/26/18 1037  . potassium chloride (K-DUR) CR tablet 10 mEq  10 mEq Oral BID Henreitta Leber, MD   10 mEq at 03/26/18 1035  . primidone (MYSOLINE) tablet 50 mg  50 mg Oral BID Henreitta Leber, MD   50 mg at 03/26/18 1036  . simvastatin (ZOCOR) tablet 40 mg  40 mg Oral QHS Henreitta Leber, MD   40 mg at 03/25/18 2127  . spironolactone (ALDACTONE) tablet 25 mg  25 mg Oral Daily Henreitta Leber, MD   25 mg at 03/26/18 1036  . tiZANidine (ZANAFLEX)  tablet 2 mg  2 mg Oral Q6H PRN Henreitta Leber, MD      . Vitamin D (Ergocalciferol) (DRISDOL) capsule 50,000 Units  50,000 Units Oral Q30 days Henreitta Leber, MD   50,000 Units at 03/23/18 5974     Discharge Medications: Please see discharge summary for a list of discharge medications.  Relevant Imaging Results:  Relevant Lab Results:   Additional Information [phone]  Zettie Pho, LCSW

## 2018-03-26 NOTE — Clinical Social Work Note (Signed)
Clinical Social Work Assessment  Patient Details  Name: Sara Pierce MRN: [id number] Date of Birth: [birth year]  Date of referral:  03/26/18               Reason for consult:  Facility Placement                Permission sought to share information with:  Chartered certified accountant granted to share information::  Yes, Verbal Permission Granted  Name::        Agency::  [facility]  Relationship::     Contact Information:     Housing/Transportation Living arrangements for the past 2 months:  [place] of Information:  Patient, Medical Team, Spouse Patient Interpreter Needed:  None Criminal Activity/Legal Involvement Pertinent to Current Situation/Hospitalization:  No - Comment as needed Significant Relationships:  Spouse, Warehouse manager Lives with:  Spouse Do you feel safe going back to the place where you live?  Yes Need for family participation in patient care:  No (Coment)  Care giving concerns:  PT recommendation for SNF   Social Worker assessment / plan:  The CSW attempted to meet with the patient at bedside; however, she was involved in personal care and asked the CSW to contact her husband. The CSW telephoned the patient's husband who gave verbal permission to begin a SNF referral in [facility] with the exception of [facility] due to their previous experience at the facility. The patient is recovering from a compression fracture of the back from September 2018 which was her last SNF admission. The patient left the facility due to feeling limitation in care. The patient has had home health PT, RN and aide through [facility] since that time, and she used to be involved in [facility]; however, her leaving of the facility discontinued her relationship with [facility].  The CSW has begun the referral process and will follow up with bed offers. The patient will most likely discharge tomorrow if medically  stable and will require EMS for transport.  Employment status:  Retired Forensic scientist:  Information systems manager, [employer] PT Recommendations:  [place] / Referral to community resources:  [place]  Patient/Family's Response to care:  The patient's family thanked the CSW.  Patient/Family's Understanding of and Emotional Response to Diagnosis, Current Treatment, and Prognosis:  The patient and her family understand and agree with the discharge plan.  Emotional Assessment Appearance:  Appears stated age Attitude/Demeanor/Rapport:  Engaged Affect (typically observed):  Stable Orientation:  Oriented to Self, Oriented to Place, Oriented to  Time, Oriented to Situation Alcohol / Substance use:  Never Used Psych involvement (Current and /or in the community):  No (Comment)  Discharge Needs  Concerns to be addressed:  Care Coordination, Discharge Planning Concerns Readmission within the last 30 days:  Yes Current discharge risk:  Chronically ill Barriers to Discharge:  Continued Medical Work up   [employer], LCSW 03/26/2018, 1:27 PM

## 2018-03-27 MED ORDER — PREDNISONE 10 MG PO TABS: ORAL_TABLET | ORAL | 0 refills | Status: DC

## 2018-03-27 MED ORDER — DOXYCYCLINE HYCLATE 100 MG PO TABS: 100.0000 mg | ORAL_TABLET | Freq: Two times a day (BID) | ORAL | 0 refills | Status: DC

## 2018-03-27 MED ORDER — POLYETHYLENE GLYCOL 3350 17 G PO PACK: 17.0000 g | PACK | Freq: Every day | ORAL | Status: DC

## 2018-03-27 NOTE — Clinical Social Work Note (Signed)
Patient is to discharge today to [facility]. CSW extended bed offers to patient and chose [facility] due to location. Patient is alert and oriented X4 and stated she would call her husband. CSW has notified [facility] John at [facility] and fowarded the discharge information. Patient requests to go via EMS. Shela Leff MSW,LCSW [phone]

## 2018-03-27 NOTE — Discharge Summary (Signed)
[name]    MR#:  [id number]  DATE OF BIRTH:  Mar 26, 1941  DATE OF ADMISSION:  03/23/2018 ADMITTING PHYSICIAN: Henreitta Leber, MD  DATE OF DISCHARGE: 03/27/2018  PRIMARY CARE PHYSICIAN: Marygrace Drought, MD    ADMISSION DIAGNOSIS:  COPD exacerbation ([place]) [J44.1] Chronic respiratory failure with hypoxia (HCC) [J96.11]  DISCHARGE DIAGNOSIS:  acute on chronic respiratory failure secondary to COPD exacerbation chronic home oxygen use acute bronchitis SECONDARY DIAGNOSIS:   Past Medical History:  Diagnosis Date  . Arthritis   . Cardiomyopathy ([place])   . Cataract   . Cerebral hemorrhage ([place])   . CHF (congestive heart failure) ([place])   . Chronic kidney disease   . Clotting disorder ([place])   . COPD (chronic obstructive pulmonary disease) ([place])   . Depression   . Diabetes mellitus without complication ([place])   . Hypertension   . Mixed incontinence   . Obesity   . Sleep apnea   . Thyroid disease   . Urinary frequency   . Vaginal atrophy   . Varicose veins     HOSPITAL COURSE:   77 year old female with past medical history of essential hypertension, diabetes, COPD, chronic respiratory failure, chronic kidney disease stage III, history of previous CVA, osteoarthritis, hypothyroidism who presented to the hospital due to shortness of breath, cough.  1. acute on chronic hypoxic respiratory failure secondary toCOPD exacerbation-this is a cause of patient's worsening respiratory failure. - Etiology unclear as patient's chest x-ray / CT chest are negative for any acute consolidation or pneumonia. -Suspect to be secondary to bronchitis. negative respiratory panel, empirically placed her on doxycycline - Placed on IV steroids, scheduled duo nebs, Pulmicort nebs--- change to oral steroids -Patient is on chronic oxygen at home.  - MRSA screen positive,switch to doxy oral  2. Hypothyroidism-continue  thyroid supplements.  3. History of chronic diastolic CHF-clinically patient is not in congestive heart failure.  -Continue her baseline Lasix, Aldactone.  4. Hyperlipidemia-continue Zocor. Next  5. Anxiety-continue Wellbutrin. Next  6. Chronic iron deficiency anemia-continue iron supplements.  7. Neuropathy-continue gabapentin.  Physical therapy's outpatient. Recommends rehab. Per patient she has not walked in a long time. I encouraged her to do physical therapy intensive at rehab to see  benefits. Patient states she is motivated to do therapy. Will discharge to rehab today.   CONSULTS OBTAINED:    DRUG ALLERGIES:   Allergies  Allergen Reactions  . Aspirin Swelling  . Codeine Itching  . Naprosyn [Naproxen] Other (See Comments)    Per MAR pt allergic  . Other     Elastic Bandages/ Supports Per MAR  . Tape Hives  . Valacyclovir     DISCHARGE MEDICATIONS:   Allergies as of 03/27/2018      Reactions   Aspirin Swelling   Codeine Itching   Naprosyn [naproxen] Other (See Comments)   Per MAR pt allergic   Other    Elastic Bandages/ Supports Per MAR   Tape Hives   Valacyclovir       Medication List    STOP taking these medications   guaiFENesin-dextromethorphan 100-10 MG/5ML syrup Commonly known as:  ROBITUSSIN DM   ipratropium-albuterol 0.5-2.5 (3) MG/3ML Soln Commonly known as:  DUONEB   levofloxacin 500 MG tablet Commonly known as:  LEVAQUIN   multivitamin with minerals Tabs tablet   sodium chloride 0.65 % Soln nasal spray Commonly known as:  OCEAN     TAKE these medications  acetaminophen 650 MG CR tablet Commonly known as:  TYLENOL Take 650 mg by mouth every 8 (eight) hours as needed for pain.   albuterol (2.5 MG/3ML) 0.083% nebulizer solution Commonly known as:  PROVENTIL Take 2.5 mg by nebulization every 4 (four) hours as needed for wheezing or shortness of breath.   albuterol 108 (90 Base) MCG/ACT inhaler Commonly known as:   PROVENTIL HFA;VENTOLIN HFA Inhale 2 puffs into the lungs every 6 (six) hours as needed for wheezing or shortness of breath.   ANORO ELLIPTA 62.5-25 MCG/INH Aepb Generic drug:  umeclidinium-vilanterol Inhale 1 puff into the lungs daily.   budesonide 0.5 MG/2ML nebulizer solution Commonly known as:  PULMICORT Take 2 mLs (0.5 mg total) 2 (two) times daily by nebulization.   buPROPion 150 MG 24 hr tablet Commonly known as:  WELLBUTRIN XL Take 150 mg by mouth daily.   [employer] ZINC OXIDE 20 % ointment Generic drug:  zinc oxide Apply 1 application topically daily as needed for irritation.   diphenhydrAMINE 25 mg capsule Commonly known as:  BENADRYL Take 25 mg by mouth every 6 (six) hours as needed for itching or allergies.   doxycycline 100 MG tablet Commonly known as:  VIBRA-TABS Take 1 tablet (100 mg total) by mouth every 12 (twelve) hours.   ergocalciferol 50000 units capsule Commonly known as:  VITAMIN D2 Take 50,000 Units by mouth every 30 (thirty) days.   ferrous sulfate 325 (65 FE) MG tablet Take 1 tablet (325 mg total) 2 (two) times daily with a meal by mouth. What changed:  when to take this   fluticasone 50 MCG/ACT nasal spray Commonly known as:  FLONASE Place 1 spray into both nostrils daily.   furosemide 40 MG tablet Commonly known as:  LASIX Take 40 mg by mouth 2 (two) times daily.   gabapentin 300 MG capsule Commonly known as:  NEURONTIN Take 300 mg by mouth 3 (three) times daily.   hydrOXYzine 25 MG tablet Commonly known as:  ATARAX/VISTARIL Take 25 mg by mouth 2 (two) times daily as needed for anxiety.   liothyronine 25 MCG tablet Commonly known as:  CYTOMEL Take 25 mcg by mouth daily.   magnesium oxide 400 MG tablet Commonly known as:  MAG-OX Take 400 mg by mouth daily.   montelukast 10 MG tablet Commonly known as:  SINGULAIR Take 10 mg by mouth at bedtime.   nystatin cream Commonly known as:  MYCOSTATIN Apply 1 application topically 2 (two)  times daily.   polyethylene glycol powder powder Commonly known as:  GLYCOLAX/MIRALAX Take 17 g by mouth daily.   potassium chloride 10 MEQ tablet Commonly known as:  K-DUR Take 10 mEq by mouth 2 (two) times daily.   predniSONE 10 MG tablet Commonly known as:  DELTASONE Take 50 mg daily taper by 10 mg daily  Then stop Start taking on:  03/28/2018 What changed:  additional instructions   primidone 50 MG tablet Commonly known as:  MYSOLINE Take 50 mg by mouth 2 (two) times daily.   senna-docusate 8.6-50 MG tablet Commonly known as:  Senokot-S Take 1 tablet by mouth daily.   simvastatin 40 MG tablet Commonly known as:  ZOCOR Take 40 mg by mouth at bedtime.   spironolactone 25 MG tablet Commonly known as:  ALDACTONE Take 25 mg by mouth daily.   tiZANidine 2 MG tablet Commonly known as:  ZANAFLEX Take 2 mg by mouth every 6 (six) hours as needed for muscle spasms.  If you experience worsening of your admission symptoms, develop shortness of breath, life threatening emergency, suicidal or homicidal thoughts you must seek medical attention immediately by calling 911 or calling your MD immediately  if symptoms less severe.  You Must read complete instructions/literature along with all the possible adverse reactions/side effects for all the Medicines you take and that have been prescribed to you. Take any new Medicines after you have completely understood and accept all the possible adverse reactions/side effects.   Please note  You were cared for by a hospitalist during your hospital stay. If you have any questions about your discharge medications or the care you received while you were in the hospital after you are discharged, you can call the unit and asked to speak with the hospitalist on call if the hospitalist that took care of you is not available. Once you are discharged, your primary care physician will handle any further medical issues. Please note that NO REFILLS for  any discharge medications will be authorized once you are discharged, as it is imperative that you return to your primary care physician (or establish a relationship with a primary care physician if you do not have one) for your aftercare needs so that they can reassess your need for medications and monitor your lab values. Today   SUBJECTIVE   Doing well. C/o cough  VITAL SIGNS:  Blood pressure (!) 115/56, pulse 70, temperature (!) 97.5 F (36.4 C), temperature source Oral, resp. rate 20, height 5\' 5"  (1.651 m), weight 99.4 kg (219 lb 1.6 oz), SpO2 99 %.  I/O:    Intake/Output Summary (Last 24 hours) at 03/27/2018 1042 Last data filed at 03/27/2018 0546 Gross per 24 hour  Intake 240 ml  Output 2650 ml  Net -2410 ml    PHYSICAL EXAMINATION:  GENERAL:  77 y.o.-year-old patient lying in the bed with no acute distress. obese EYES: Pupils equal, round, reactive to light and accommodation. No scleral icterus. Extraocular muscles intact.  HEENT: Head atraumatic, normocephalic. Oropharynx and nasopharynx clear.  NECK:  Supple, no jugular venous distention. No thyroid enlargement, no tenderness.  LUNGS: Normal breath sounds bilaterally, scattered wheezing, no rales,rhonchi or crepitation. No use of accessory muscles of respiration.  CARDIOVASCULAR: S1, S2 normal. No murmurs, rubs, or gallops.  ABDOMEN: Soft, non-tender, non-distended. Bowel sounds present. No organomegaly or mass.  EXTREMITIES: No pedal edema, cyanosis, or clubbing.  NEUROLOGIC: Cranial nerves II through XII are intact. Muscle strength 4/5 in all extremities. Sensation intact. Gait not checked.  PSYCHIATRIC: The patient is alert and oriented x 3.  SKIN: No obvious rash, lesion, or ulcer.   DATA REVIEW:   CBC  Recent Labs  Lab 03/24/18 0707  WBC 5.4  HGB 10.9*  HCT 32.3*  PLT 237    Chemistries  Recent Labs  Lab 03/23/18 1112 03/24/18 0707  NA 135 134*  K 4.3 4.1  CL 96* 97*  CO2 30 29  GLUCOSE 106* 135*   BUN 22* 33*  CREATININE 0.83 0.90  CALCIUM 9.7 9.2  AST 16  --   ALT 10*  --   ALKPHOS 86  --   BILITOT 0.8  --     Microbiology Results   Recent Results (from the past 240 hour(s))  Respiratory Panel by PCR     Status: None   Collection Time: 03/23/18  7:50 PM  Result Value Ref Range Status   Adenovirus NOT DETECTED NOT DETECTED Final   Coronavirus 229E NOT DETECTED NOT DETECTED  Final   Coronavirus HKU1 NOT DETECTED NOT DETECTED Final   Coronavirus NL63 NOT DETECTED NOT DETECTED Final   Coronavirus OC43 NOT DETECTED NOT DETECTED Final   Metapneumovirus NOT DETECTED NOT DETECTED Final   Rhinovirus / Enterovirus NOT DETECTED NOT DETECTED Final   Influenza A NOT DETECTED NOT DETECTED Final   Influenza B NOT DETECTED NOT DETECTED Final   Parainfluenza Virus 1 NOT DETECTED NOT DETECTED Final   Parainfluenza Virus 2 NOT DETECTED NOT DETECTED Final   Parainfluenza Virus 3 NOT DETECTED NOT DETECTED Final   Parainfluenza Virus 4 NOT DETECTED NOT DETECTED Final   Respiratory Syncytial Virus NOT DETECTED NOT DETECTED Final   Bordetella pertussis NOT DETECTED NOT DETECTED Final   Chlamydophila pneumoniae NOT DETECTED NOT DETECTED Final   Mycoplasma pneumoniae NOT DETECTED NOT DETECTED Final    Comment: Performed at [facility], [place] [address], [place], [place] [address]  MRSA PCR Screening     Status: Abnormal   Collection Time: 03/23/18 10:10 PM  Result Value Ref Range Status   MRSA by PCR POSITIVE (A) NEGATIVE Final    Comment:        The GeneXpert MRSA Assay (FDA approved for NASAL specimens only), is one component of a comprehensive MRSA colonization surveillance program. It is not intended to diagnose MRSA infection nor to guide or monitor treatment for MRSA infections. RESULT CALLED TO, READ BACK BY AND VERIFIED WITH: STEVEN SYKES ON 03/24/18/AT 0026 JAG Performed at [facility], [address] [place], [place] [address]     RADIOLOGY:  No results  found.   Management plans discussed with the patient, family and they are in agreement.  CODE STATUS:     Code Status Orders  (From admission, onward)        Start     Ordered   03/23/18 1813  Full code  Continuous     03/23/18 1812    Code Status History    Date Active Date Inactive Code Status Order ID Comments User Context   01/04/2018 1228 01/05/2018 2024 Full Code [id number]  Bettey Costa, MD Inpatient   10/24/2017 0523 10/28/2017 1601 Full Code [id number]  Saundra Shelling, MD Inpatient   08/20/2017 0134 08/21/2017 1828 Full Code [id number]  Lance Coon, MD Inpatient   10/03/2016 0540 10/04/2016 1711 Full Code [id number]  Harrie Foreman, MD Inpatient   11/15/2015 2223 11/19/2015 1514 Full Code [id number]  Hower, Aaron Mose, MD ED    Advance Directive Documentation     Most Recent Value  Type of Advance Directive  Healthcare Power of [place], Living will  Pre-existing out of facility DNR order (yellow form or pink MOST form)  -  "MOST" Form in Place?  -      TOTAL TIME TAKING CARE OF THIS PATIENT: *40* minutes.    Fritzi Mandes M.D on 03/27/2018 at 10:42 AM  Between 7am to 6pm - Pager - [phone] After 6pm go to [email or web address] - password [email or web address] [facility]  Office  [phone]  CC: Primary care physician; Marygrace Drought, MD

## 2018-03-27 NOTE — Care Management (Signed)
Sara Pierce with [facility] notified of patient discharging to [facility]

## 2018-03-27 NOTE — Progress Notes (Signed)
Report called to [place], Therapist, sports, at [facility].  Per patient, would like to eat lunch prior to transport by EMS.  Will call EMS as soon as patient finishes eating.

## 2018-03-27 NOTE — Clinical Social Work Placement (Signed)
   CLINICAL SOCIAL WORK PLACEMENT  NOTE  Date:  03/27/2018  Patient Details  Name: Sara Pierce MRN: [id number] Date of Birth: 1941-11-11  Clinical Social Work is seeking post-discharge placement for this patient at the [place] level of care (*CSW will initial, date and re-position this form in  chart as items are completed):  Yes   Patient/family provided with [facility] Work Department's list of facilities offering this level of care within the geographic area requested by the patient (or if unable, by the patient's family).  Yes   Patient/family informed of their freedom to choose among providers that offer the needed level of care, that participate in Medicare, Medicaid or managed care program needed by the patient, have an available bed and are willing to accept the patient.  Yes   Patient/family informed of [facility]'s ownership interest in [facility] and [facility], as well as of the fact that they are under no obligation to receive care at these facilities.  PASRR submitted to EDS on       PASRR number received on       Existing PASRR number confirmed on 03/26/18     FL2 transmitted to all facilities in geographic area requested by pt/family on 03/26/18     FL2 transmitted to all facilities within larger geographic area on       Patient informed that his/her managed care company has contracts with or will negotiate with certain facilities, including the following:        Yes   Patient/family informed of bed offers received.  Patient chooses bed at [facility]     Physician recommends and patient chooses bed at [facility])    Patient to be transferred to [name] on 03/27/18.  Patient to be transferred to facility by EMS     Patient family notified on 03/27/18 of transfer.  Name of family member notified:  Patient Alert and oriented x4 and stated she would notify husband      PHYSICIAN        Additional Comment:    _______________________________________________ Annamaria Boots, [place] 03/27/2018, 11:03 AM

## 2018-03-27 NOTE — Care Management Important Message (Signed)
Important Message  Patient Details  Name: Sara Pierce MRN: [id number] Date of Birth: 1941-04-08   Medicare Important Message Given:  Yes    Beverly Sessions, RN 03/27/2018, 12:09 PM

## 2018-04-15 ENCOUNTER — Inpatient Hospital Stay
Admission: EM | Admit: 2018-04-15 | Discharge: 2018-04-22 | DRG: 280 | Disposition: A | Discharge status: Skilled Nursing Facility | Payer: Medicare Other | Attending: Internal Medicine | Admitting: Internal Medicine

## 2018-04-15 ENCOUNTER — Other Ambulatory Visit: Payer: Self-pay

## 2018-04-15 ENCOUNTER — Emergency Department: Payer: Medicare Other

## 2018-04-15 ENCOUNTER — Inpatient Hospital Stay: Payer: Medicare Other

## 2018-04-15 DIAGNOSIS — M1990 Unspecified osteoarthritis, unspecified site: Secondary | ICD-10-CM | POA: Diagnosis present

## 2018-04-15 DIAGNOSIS — I9589 Other hypotension: Secondary | ICD-10-CM | POA: Diagnosis present

## 2018-04-15 DIAGNOSIS — Z9114 Patient's other noncompliance with medication regimen: Secondary | ICD-10-CM

## 2018-04-15 DIAGNOSIS — E782 Mixed hyperlipidemia: Secondary | ICD-10-CM | POA: Diagnosis present

## 2018-04-15 DIAGNOSIS — M4850XA Collapsed vertebra, not elsewhere classified, site unspecified, initial encounter for fracture: Secondary | ICD-10-CM | POA: Diagnosis present

## 2018-04-15 DIAGNOSIS — A4159 Other Gram-negative sepsis: Secondary | ICD-10-CM | POA: Diagnosis not present

## 2018-04-15 DIAGNOSIS — X58XXXA Exposure to other specified factors, initial encounter: Secondary | ICD-10-CM | POA: Diagnosis present

## 2018-04-15 DIAGNOSIS — Z7951 Long term (current) use of inhaled steroids: Secondary | ICD-10-CM

## 2018-04-15 DIAGNOSIS — G253 Myoclonus: Secondary | ICD-10-CM | POA: Diagnosis not present

## 2018-04-15 DIAGNOSIS — Z7984 Long term (current) use of oral hypoglycemic drugs: Secondary | ICD-10-CM

## 2018-04-15 DIAGNOSIS — E871 Hypo-osmolality and hyponatremia: Secondary | ICD-10-CM | POA: Diagnosis not present

## 2018-04-15 DIAGNOSIS — R570 Cardiogenic shock: Secondary | ICD-10-CM | POA: Diagnosis present

## 2018-04-15 DIAGNOSIS — T7401XA Adult neglect or abandonment, confirmed, initial encounter: Secondary | ICD-10-CM | POA: Diagnosis present

## 2018-04-15 DIAGNOSIS — J440 Chronic obstructive pulmonary disease with acute lower respiratory infection: Secondary | ICD-10-CM | POA: Diagnosis present

## 2018-04-15 DIAGNOSIS — G8929 Other chronic pain: Secondary | ICD-10-CM | POA: Diagnosis present

## 2018-04-15 DIAGNOSIS — Z91048 Other nonmedicinal substance allergy status: Secondary | ICD-10-CM

## 2018-04-15 DIAGNOSIS — I5032 Chronic diastolic (congestive) heart failure: Secondary | ICD-10-CM | POA: Diagnosis present

## 2018-04-15 DIAGNOSIS — I472 Ventricular tachycardia: Secondary | ICD-10-CM | POA: Diagnosis present

## 2018-04-15 DIAGNOSIS — N3946 Mixed incontinence: Secondary | ICD-10-CM | POA: Diagnosis present

## 2018-04-15 DIAGNOSIS — J9811 Atelectasis: Secondary | ICD-10-CM | POA: Diagnosis present

## 2018-04-15 DIAGNOSIS — Z885 Allergy status to narcotic agent status: Secondary | ICD-10-CM

## 2018-04-15 DIAGNOSIS — D649 Anemia, unspecified: Secondary | ICD-10-CM | POA: Diagnosis present

## 2018-04-15 DIAGNOSIS — R0602 Shortness of breath: Secondary | ICD-10-CM | POA: Diagnosis present

## 2018-04-15 DIAGNOSIS — E039 Hypothyroidism, unspecified: Secondary | ICD-10-CM | POA: Diagnosis present

## 2018-04-15 DIAGNOSIS — B964 Proteus (mirabilis) (morganii) as the cause of diseases classified elsewhere: Secondary | ICD-10-CM | POA: Diagnosis present

## 2018-04-15 DIAGNOSIS — Y92009 Unspecified place in unspecified non-institutional (private) residence as the place of occurrence of the external cause: Secondary | ICD-10-CM | POA: Diagnosis not present

## 2018-04-15 DIAGNOSIS — T426X5A Adverse effect of other antiepileptic and sedative-hypnotic drugs, initial encounter: Secondary | ICD-10-CM | POA: Diagnosis present

## 2018-04-15 DIAGNOSIS — J9610 Chronic respiratory failure, unspecified whether with hypoxia or hypercapnia: Secondary | ICD-10-CM | POA: Diagnosis present

## 2018-04-15 DIAGNOSIS — Z8249 Family history of ischemic heart disease and other diseases of the circulatory system: Secondary | ICD-10-CM

## 2018-04-15 DIAGNOSIS — Z791 Long term (current) use of non-steroidal anti-inflammatories (NSAID): Secondary | ICD-10-CM

## 2018-04-15 DIAGNOSIS — N390 Urinary tract infection, site not specified: Secondary | ICD-10-CM | POA: Diagnosis present

## 2018-04-15 DIAGNOSIS — Z79899 Other long term (current) drug therapy: Secondary | ICD-10-CM

## 2018-04-15 DIAGNOSIS — Z9981 Dependence on supplemental oxygen: Secondary | ICD-10-CM

## 2018-04-15 DIAGNOSIS — R6521 Severe sepsis with septic shock: Secondary | ICD-10-CM | POA: Diagnosis not present

## 2018-04-15 DIAGNOSIS — G4733 Obstructive sleep apnea (adult) (pediatric): Secondary | ICD-10-CM | POA: Diagnosis present

## 2018-04-15 DIAGNOSIS — F419 Anxiety disorder, unspecified: Secondary | ICD-10-CM | POA: Diagnosis present

## 2018-04-15 DIAGNOSIS — R7989 Other specified abnormal findings of blood chemistry: Secondary | ICD-10-CM

## 2018-04-15 DIAGNOSIS — I429 Cardiomyopathy, unspecified: Secondary | ICD-10-CM | POA: Diagnosis present

## 2018-04-15 DIAGNOSIS — J189 Pneumonia, unspecified organism: Secondary | ICD-10-CM | POA: Diagnosis present

## 2018-04-15 DIAGNOSIS — I8390 Asymptomatic varicose veins of unspecified lower extremity: Secondary | ICD-10-CM | POA: Diagnosis present

## 2018-04-15 DIAGNOSIS — Z87891 Personal history of nicotine dependence: Secondary | ICD-10-CM

## 2018-04-15 DIAGNOSIS — R778 Other specified abnormalities of plasma proteins: Secondary | ICD-10-CM

## 2018-04-15 DIAGNOSIS — E1165 Type 2 diabetes mellitus with hyperglycemia: Secondary | ICD-10-CM | POA: Diagnosis not present

## 2018-04-15 DIAGNOSIS — I493 Ventricular premature depolarization: Secondary | ICD-10-CM | POA: Diagnosis present

## 2018-04-15 DIAGNOSIS — I161 Hypertensive emergency: Secondary | ICD-10-CM | POA: Diagnosis present

## 2018-04-15 DIAGNOSIS — I130 Hypertensive heart and chronic kidney disease with heart failure and stage 1 through stage 4 chronic kidney disease, or unspecified chronic kidney disease: Secondary | ICD-10-CM | POA: Diagnosis present

## 2018-04-15 DIAGNOSIS — Z886 Allergy status to analgesic agent status: Secondary | ICD-10-CM

## 2018-04-15 DIAGNOSIS — Z9049 Acquired absence of other specified parts of digestive tract: Secondary | ICD-10-CM

## 2018-04-15 DIAGNOSIS — J398 Other specified diseases of upper respiratory tract: Secondary | ICD-10-CM | POA: Diagnosis present

## 2018-04-15 DIAGNOSIS — E1122 Type 2 diabetes mellitus with diabetic chronic kidney disease: Secondary | ICD-10-CM | POA: Diagnosis present

## 2018-04-15 DIAGNOSIS — J9801 Acute bronchospasm: Secondary | ICD-10-CM | POA: Diagnosis present

## 2018-04-15 DIAGNOSIS — Z993 Dependence on wheelchair: Secondary | ICD-10-CM

## 2018-04-15 DIAGNOSIS — Z79891 Long term (current) use of opiate analgesic: Secondary | ICD-10-CM

## 2018-04-15 DIAGNOSIS — Z452 Encounter for adjustment and management of vascular access device: Secondary | ICD-10-CM

## 2018-04-15 DIAGNOSIS — Z6841 Body Mass Index (BMI) 40.0 and over, adult: Secondary | ICD-10-CM | POA: Diagnosis not present

## 2018-04-15 DIAGNOSIS — I214 Non-ST elevation (NSTEMI) myocardial infarction: Principal | ICD-10-CM

## 2018-04-15 DIAGNOSIS — N189 Chronic kidney disease, unspecified: Secondary | ICD-10-CM | POA: Diagnosis present

## 2018-04-15 DIAGNOSIS — E1142 Type 2 diabetes mellitus with diabetic polyneuropathy: Secondary | ICD-10-CM | POA: Diagnosis present

## 2018-04-15 DIAGNOSIS — Z7401 Bed confinement status: Secondary | ICD-10-CM

## 2018-04-15 DIAGNOSIS — Z8349 Family history of other endocrine, nutritional and metabolic diseases: Secondary | ICD-10-CM

## 2018-04-15 LAB — BASIC METABOLIC PANEL
ANION GAP: 8 (ref 5–15)
ANION GAP: 7 (ref 5–15)
BUN: 26 mg/dL — AB (ref 6–20)
BUN: 28 mg/dL — ABNORMAL HIGH (ref 6–20)
CALCIUM: 8.9 mg/dL (ref 8.9–10.3)
CHLORIDE: 99 mmol/L — AB (ref 101–111)
CO2: 31 mmol/L (ref 22–32)
CO2: 30 mmol/L (ref 22–32)
Calcium: 9.5 mg/dL (ref 8.9–10.3)
Chloride: 104 mmol/L (ref 101–111)
Creatinine, Ser: 0.68 mg/dL (ref 0.44–1.00)
Creatinine, Ser: 1.13 mg/dL — ABNORMAL HIGH (ref 0.44–1.00)
GFR calc Af Amer: >60 mL/min (ref >60)
GFR, EST AFRICAN AMERICAN: 54 mL/min — AB (ref >60)
GFR, EST NON AFRICAN AMERICAN: 47 mL/min — AB (ref >60)
Glucose, Bld: 130 mg/dL — ABNORMAL HIGH (ref 65–99)
Glucose, Bld: 127 mg/dL — ABNORMAL HIGH (ref 65–99)
POTASSIUM: 4.5 mmol/L (ref 3.5–5.1)
POTASSIUM: 3.6 mmol/L (ref 3.5–5.1)
Sodium: 138 mmol/L (ref 135–145)
Sodium: 141 mmol/L (ref 135–145)

## 2018-04-15 LAB — CBC WITH DIFFERENTIAL/PLATELET
BASOS ABS: 0.1 10*3/uL (ref 0–0.1)
BASOS ABS: 0.1 10*3/uL (ref 0–0.1)
BASOS PCT: 1 %
Basophils Relative: 1 %
EOS PCT: 1 %
EOS PCT: 1 %
Eosinophils Absolute: 0.1 10*3/uL (ref 0–0.7)
Eosinophils Absolute: 0.1 10*3/uL (ref 0–0.7)
HCT: 33.2 % — ABNORMAL LOW (ref 35.0–47.0)
HEMATOCRIT: 35.9 % (ref 35.0–47.0)
HEMOGLOBIN: 12.1 g/dL (ref 12.0–16.0)
Hemoglobin: 11.1 g/dL — ABNORMAL LOW (ref 12.0–16.0)
LYMPHS PCT: 14 %
Lymphocytes Relative: 16 %
Lymphs Abs: 1.7 10*3/uL (ref 1.0–3.6)
Lymphs Abs: 1.9 10*3/uL (ref 1.0–3.6)
MCH: 31.9 pg (ref 26.0–34.0)
MCH: 31.8 pg (ref 26.0–34.0)
MCHC: 33.8 g/dL (ref 32.0–36.0)
MCHC: 33.6 g/dL (ref 32.0–36.0)
MCV: 94.4 fL (ref 80.0–100.0)
MCV: 94.9 fL (ref 80.0–100.0)
MONO ABS: 0.5 10*3/uL (ref 0.2–0.9)
Monocytes Absolute: 0.4 10*3/uL (ref 0.2–0.9)
Monocytes Relative: 3 %
Monocytes Relative: 4 %
NEUTROS ABS: 10.2 10*3/uL — AB (ref 1.4–6.5)
NEUTROS PCT: 81 %
NEUTROS PCT: 78 %
Neutro Abs: 9 10*3/uL — ABNORMAL HIGH (ref 1.4–6.5)
PLATELETS: 277 10*3/uL (ref 150–440)
PLATELETS: 284 10*3/uL (ref 150–440)
RBC: 3.8 MIL/uL (ref 3.80–5.20)
RBC: 3.5 MIL/uL — AB (ref 3.80–5.20)
RDW: 16.2 % — ABNORMAL HIGH (ref 11.5–14.5)
RDW: 16.6 % — AB (ref 11.5–14.5)
WBC: 12.5 10*3/uL — AB (ref 3.6–11.0)
WBC: 11.5 10*3/uL — AB (ref 3.6–11.0)

## 2018-04-15 LAB — URINALYSIS, COMPLETE (UACMP) WITH MICROSCOPIC
BILIRUBIN URINE: NEGATIVE
Glucose, UA: NEGATIVE mg/dL
Hgb urine dipstick: NEGATIVE
KETONES UR: NEGATIVE mg/dL
NITRITE: NEGATIVE
PH: 7 (ref 5.0–8.0)
PROTEIN: NEGATIVE mg/dL
Specific Gravity, Urine: 1.011 (ref 1.005–1.030)
Squamous Epithelial / LPF: NONE SEEN (ref 0–5)
WBC, UA: >50 WBC/hpf — ABNORMAL HIGH (ref 0–5)

## 2018-04-15 LAB — PROTIME-INR
INR: 0.97
Prothrombin Time: 12.8 seconds (ref 11.4–15.2)

## 2018-04-15 LAB — GLUCOSE, CAPILLARY
Glucose-Capillary: 125 mg/dL — ABNORMAL HIGH (ref 65–99)
Glucose-Capillary: 116 mg/dL — ABNORMAL HIGH (ref 65–99)
Glucose-Capillary: 106 mg/dL — ABNORMAL HIGH (ref 65–99)
Glucose-Capillary: 163 mg/dL — ABNORMAL HIGH (ref 65–99)

## 2018-04-15 LAB — TROPONIN I
Troponin I: 1.8 ng/mL (ref <0.03)
Troponin I: 2.84 ng/mL (ref <0.03)
Troponin I: 3.31 ng/mL (ref <0.03)
Troponin I: 3.38 ng/mL (ref <0.03)

## 2018-04-15 LAB — HEPARIN LEVEL (UNFRACTIONATED): Heparin Unfractionated: 0.16 IU/mL — ABNORMAL LOW (ref 0.30–0.70)

## 2018-04-15 LAB — TSH: TSH: 1.357 u[IU]/mL (ref 0.350–4.500)

## 2018-04-15 LAB — APTT: aPTT: 29 seconds (ref 24–36)

## 2018-04-15 LAB — PHOSPHORUS: PHOSPHORUS: 4.1 mg/dL (ref 2.5–4.6)

## 2018-04-15 LAB — MRSA PCR SCREENING: MRSA by PCR: NEGATIVE

## 2018-04-15 LAB — MAGNESIUM: MAGNESIUM: 2.1 mg/dL (ref 1.7–2.4)

## 2018-04-15 MED ORDER — SODIUM CHLORIDE 0.9 % IV BOLUS
1000.0000 mL | Freq: Once | INTRAVENOUS | Status: AC
  Administered 2018-04-15: 1000 mL via INTRAVENOUS

## 2018-04-15 MED ORDER — HEPARIN BOLUS VIA INFUSION
2000.0000 [IU] | Freq: Once | INTRAVENOUS | Status: AC
  Administered 2018-04-15: 2000 [IU] via INTRAVENOUS
  Filled 2018-04-15: qty 2000

## 2018-04-15 MED ORDER — HEPARIN (PORCINE) IN NACL 100-0.45 UNIT/ML-% IJ SOLN
1250.0000 [IU]/h | INTRAMUSCULAR | Status: DC
  Administered 2018-04-15: 950 [IU]/h via INTRAVENOUS
  Administered 2018-04-16: 1150 [IU]/h via INTRAVENOUS
  Administered 2018-04-16 – 2018-04-18 (×2): 1300 [IU]/h via INTRAVENOUS
  Administered 2018-04-19 – 2018-04-21 (×3): 1450 [IU]/h via INTRAVENOUS
  Administered 2018-04-22: 1250 [IU]/h via INTRAVENOUS
  Filled 2018-04-15 (×10): qty 250

## 2018-04-15 MED ORDER — SIMVASTATIN 20 MG PO TABS: 40.0000 mg | ORAL_TABLET | Freq: Every day | ORAL | Status: DC

## 2018-04-15 MED ORDER — CLOPIDOGREL BISULFATE 75 MG PO TABS
75.0000 mg | ORAL_TABLET | Freq: Every day | ORAL | Status: DC
  Administered 2018-04-16 – 2018-04-22 (×7): 75 mg via ORAL
  Filled 2018-04-15 (×7): qty 1

## 2018-04-15 MED ORDER — POTASSIUM CHLORIDE CRYS ER 10 MEQ PO TBCR
10.0000 meq | EXTENDED_RELEASE_TABLET | Freq: Two times a day (BID) | ORAL | Status: DC
  Administered 2018-04-15: 10 meq via ORAL
  Filled 2018-04-15: qty 1

## 2018-04-15 MED ORDER — SODIUM CHLORIDE 0.9% FLUSH
3.0000 mL | Freq: Two times a day (BID) | INTRAVENOUS | Status: DC
  Administered 2018-04-15 – 2018-04-22 (×9): 3 mL via INTRAVENOUS

## 2018-04-15 MED ORDER — ACETAMINOPHEN 650 MG RE SUPP: 650.0000 mg | Freq: Four times a day (QID) | RECTAL | Status: DC | PRN

## 2018-04-15 MED ORDER — CLONAZEPAM 1 MG PO TABS
1.0000 mg | ORAL_TABLET | Freq: Two times a day (BID) | ORAL | Status: DC
  Administered 2018-04-15: 1 mg via ORAL
  Filled 2018-04-15: qty 1

## 2018-04-15 MED ORDER — HEPARIN (PORCINE) IN NACL 100-0.45 UNIT/ML-% IJ SOLN: 14.0000 [IU]/kg/h | Freq: Once | INTRAMUSCULAR | Status: DC

## 2018-04-15 MED ORDER — ASPIRIN 81 MG PO CHEW
CHEWABLE_TABLET | ORAL | Status: AC
  Filled 2018-04-15: qty 4

## 2018-04-15 MED ORDER — NOREPINEPHRINE 4 MG/250ML-% IV SOLN
0.0000 ug/min | INTRAVENOUS | Status: DC
  Administered 2018-04-15: 5 ug/min via INTRAVENOUS
  Administered 2018-04-15: 30 ug/min via INTRAVENOUS
  Filled 2018-04-15: qty 250

## 2018-04-15 MED ORDER — MIRTAZAPINE 15 MG PO TABS
7.5000 mg | ORAL_TABLET | Freq: Every day | ORAL | Status: DC
  Administered 2018-04-16 – 2018-04-21 (×6): 7.5 mg via ORAL
  Filled 2018-04-15 (×6): qty 1

## 2018-04-15 MED ORDER — GABAPENTIN 400 MG PO CAPS
400.0000 mg | ORAL_CAPSULE | Freq: Three times a day (TID) | ORAL | Status: DC
  Administered 2018-04-15 – 2018-04-21 (×18): 400 mg via ORAL
  Filled 2018-04-15 (×18): qty 1

## 2018-04-15 MED ORDER — SODIUM CHLORIDE 0.9% FLUSH: 3.0000 mL | INTRAVENOUS | Status: DC | PRN

## 2018-04-15 MED ORDER — ONDANSETRON HCL 4 MG PO TABS: 4.0000 mg | ORAL_TABLET | Freq: Four times a day (QID) | ORAL | Status: DC | PRN

## 2018-04-15 MED ORDER — POTASSIUM CHLORIDE 10 MEQ/50ML IV SOLN
10.0000 meq | INTRAVENOUS | Status: AC
  Administered 2018-04-15 (×2): 10 meq via INTRAVENOUS
  Filled 2018-04-15 (×2): qty 50

## 2018-04-15 MED ORDER — METOPROLOL TARTRATE 25 MG PO TABS
25.0000 mg | ORAL_TABLET | Freq: Two times a day (BID) | ORAL | Status: DC
  Filled 2018-04-15: qty 1

## 2018-04-15 MED ORDER — UMECLIDINIUM-VILANTEROL 62.5-25 MCG/INH IN AEPB
1.0000 | INHALATION_SPRAY | Freq: Every day | RESPIRATORY_TRACT | Status: DC
  Administered 2018-04-15 – 2018-04-16 (×2): 1 via RESPIRATORY_TRACT
  Filled 2018-04-15 (×3): qty 14

## 2018-04-15 MED ORDER — MORPHINE SULFATE (PF) 2 MG/ML IV SOLN
2.0000 mg | Freq: Once | INTRAVENOUS | Status: AC
  Administered 2018-04-15: 2 mg via INTRAVENOUS

## 2018-04-15 MED ORDER — SODIUM CHLORIDE 0.9 % IV SOLN
1.0000 g | Freq: Once | INTRAVENOUS | Status: AC
  Administered 2018-04-15: 1 g via INTRAVENOUS
  Filled 2018-04-15: qty 10

## 2018-04-15 MED ORDER — DOPAMINE-DEXTROSE 3.2-5 MG/ML-% IV SOLN
INTRAVENOUS | Status: AC
  Filled 2018-04-15: qty 250

## 2018-04-15 MED ORDER — MORPHINE SULFATE (PF) 2 MG/ML IV SOLN
INTRAVENOUS | Status: AC
  Filled 2018-04-15: qty 1

## 2018-04-15 MED ORDER — POTASSIUM CHLORIDE ER 10 MEQ PO TBCR
10.0000 meq | EXTENDED_RELEASE_TABLET | Freq: Two times a day (BID) | ORAL | Status: DC
  Filled 2018-04-15: qty 1

## 2018-04-15 MED ORDER — TIZANIDINE HCL 2 MG PO TABS
2.0000 mg | ORAL_TABLET | Freq: Four times a day (QID) | ORAL | Status: DC | PRN
  Administered 2018-04-15 – 2018-04-22 (×13): 2 mg via ORAL
  Filled 2018-04-15 (×15): qty 1

## 2018-04-15 MED ORDER — ARIPIPRAZOLE 5 MG PO TABS
5.0000 mg | ORAL_TABLET | Freq: Every day | ORAL | Status: DC
  Administered 2018-04-16 – 2018-04-21 (×6): 5 mg via ORAL
  Filled 2018-04-15 (×8): qty 1

## 2018-04-15 MED ORDER — ATORVASTATIN CALCIUM 20 MG PO TABS
40.0000 mg | ORAL_TABLET | Freq: Every day | ORAL | Status: DC
  Administered 2018-04-15 – 2018-04-21 (×7): 40 mg via ORAL
  Filled 2018-04-15 (×7): qty 2

## 2018-04-15 MED ORDER — BUPROPION HCL ER (XL) 150 MG PO TB24
150.0000 mg | ORAL_TABLET | Freq: Every day | ORAL | Status: DC
  Administered 2018-04-15 – 2018-04-22 (×8): 150 mg via ORAL
  Filled 2018-04-15 (×8): qty 1

## 2018-04-15 MED ORDER — LIOTHYRONINE SODIUM 25 MCG PO TABS
25.0000 ug | ORAL_TABLET | Freq: Every day | ORAL | Status: DC
  Administered 2018-04-15 – 2018-04-22 (×8): 25 ug via ORAL
  Filled 2018-04-15 (×9): qty 1

## 2018-04-15 MED ORDER — MONTELUKAST SODIUM 10 MG PO TABS
10.0000 mg | ORAL_TABLET | Freq: Every day | ORAL | Status: DC
  Administered 2018-04-16: 10 mg via ORAL
  Filled 2018-04-15: qty 1

## 2018-04-15 MED ORDER — SODIUM CHLORIDE 0.9 % IV SOLN
INTRAVENOUS | Status: DC
  Administered 2018-04-15 – 2018-04-21 (×4): via INTRAVENOUS

## 2018-04-15 MED ORDER — MORPHINE SULFATE (PF) 2 MG/ML IV SOLN
1.0000 mg | INTRAVENOUS | Status: AC
  Administered 2018-04-15: 1 mg via INTRAVENOUS
  Filled 2018-04-15: qty 1

## 2018-04-15 MED ORDER — FUROSEMIDE 40 MG PO TABS
40.0000 mg | ORAL_TABLET | Freq: Two times a day (BID) | ORAL | Status: DC
  Administered 2018-04-15: 40 mg via ORAL
  Filled 2018-04-15: qty 1

## 2018-04-15 MED ORDER — NITROGLYCERIN 0.4 MG SL SUBL
SUBLINGUAL_TABLET | SUBLINGUAL | Status: AC
  Filled 2018-04-15: qty 1

## 2018-04-15 MED ORDER — ORAL CARE MOUTH RINSE: 15.0000 mL | Freq: Two times a day (BID) | OROMUCOSAL | Status: DC

## 2018-04-15 MED ORDER — NITROGLYCERIN 0.4 MG SL SUBL
0.4000 mg | SUBLINGUAL_TABLET | SUBLINGUAL | Status: DC | PRN
  Administered 2018-04-15: 0.4 mg via SUBLINGUAL

## 2018-04-15 MED ORDER — TRAMADOL HCL 50 MG PO TABS
50.0000 mg | ORAL_TABLET | Freq: Four times a day (QID) | ORAL | Status: DC | PRN
  Administered 2018-04-16 – 2018-04-22 (×6): 50 mg via ORAL
  Filled 2018-04-15 (×6): qty 1

## 2018-04-15 MED ORDER — ASPIRIN 81 MG PO CHEW: 324.0000 mg | CHEWABLE_TABLET | Freq: Once | ORAL | Status: DC

## 2018-04-15 MED ORDER — SODIUM CHLORIDE 0.9 % IV BOLUS
500.0000 mL | INTRAVENOUS | Status: AC
  Administered 2018-04-15: 500 mL via INTRAVENOUS

## 2018-04-15 MED ORDER — ACETAMINOPHEN 325 MG PO TABS
650.0000 mg | ORAL_TABLET | Freq: Four times a day (QID) | ORAL | Status: DC | PRN
  Administered 2018-04-17 – 2018-04-19 (×3): 650 mg via ORAL
  Filled 2018-04-15 (×3): qty 2

## 2018-04-15 MED ORDER — VITAMIN D (ERGOCALCIFEROL) 1.25 MG (50000 UNIT) PO CAPS: 50000.0000 [IU] | ORAL_CAPSULE | ORAL | Status: DC

## 2018-04-15 MED ORDER — DOCUSATE SODIUM 100 MG PO CAPS
100.0000 mg | ORAL_CAPSULE | Freq: Two times a day (BID) | ORAL | Status: DC
  Administered 2018-04-15 – 2018-04-22 (×14): 100 mg via ORAL
  Filled 2018-04-15 (×14): qty 1

## 2018-04-15 MED ORDER — ALBUTEROL SULFATE (2.5 MG/3ML) 0.083% IN NEBU
3.0000 mL | INHALATION_SOLUTION | Freq: Four times a day (QID) | RESPIRATORY_TRACT | Status: DC | PRN
  Administered 2018-04-16 (×2): 3 mL via RESPIRATORY_TRACT
  Filled 2018-04-15 (×2): qty 3

## 2018-04-15 MED ORDER — LISINOPRIL 5 MG PO TABS
10.0000 mg | ORAL_TABLET | Freq: Every day | ORAL | Status: DC
  Administered 2018-04-15: 10 mg via ORAL
  Filled 2018-04-15: qty 1

## 2018-04-15 MED ORDER — SODIUM CHLORIDE 0.9 % IV SOLN
1.0000 g | Freq: Once | INTRAVENOUS | Status: DC
  Filled 2018-04-15: qty 10

## 2018-04-15 MED ORDER — BUDESONIDE 0.5 MG/2ML IN SUSP
0.5000 mg | Freq: Two times a day (BID) | RESPIRATORY_TRACT | Status: DC
  Administered 2018-04-15 – 2018-04-22 (×15): 0.5 mg via RESPIRATORY_TRACT
  Filled 2018-04-15 (×15): qty 2

## 2018-04-15 MED ORDER — NAPROXEN 500 MG PO TABS
500.0000 mg | ORAL_TABLET | Freq: Two times a day (BID) | ORAL | Status: DC
  Filled 2018-04-15: qty 1

## 2018-04-15 MED ORDER — FLUOXETINE HCL 20 MG PO CAPS
80.0000 mg | ORAL_CAPSULE | Freq: Every day | ORAL | Status: DC
  Administered 2018-04-15 – 2018-04-22 (×8): 80 mg via ORAL
  Filled 2018-04-15 (×9): qty 4

## 2018-04-15 MED ORDER — NOREPINEPHRINE BITARTRATE 1 MG/ML IV SOLN
0.0000 ug/min | INTRAVENOUS | Status: DC
  Administered 2018-04-15: 5 ug/min via INTRAVENOUS
  Administered 2018-04-16: 22 ug/min via INTRAVENOUS
  Administered 2018-04-17 – 2018-04-18 (×2): 30 ug/min via INTRAVENOUS
  Administered 2018-04-18: 25 ug/min via INTRAVENOUS
  Filled 2018-04-15 (×8): qty 16

## 2018-04-15 MED ORDER — HEPARIN SODIUM (PORCINE) 5000 UNIT/ML IJ SOLN
4000.0000 [IU] | Freq: Once | INTRAMUSCULAR | Status: AC
  Administered 2018-04-15: 4000 [IU] via INTRAVENOUS
  Filled 2018-04-15: qty 1

## 2018-04-15 MED ORDER — LINAGLIPTIN 5 MG PO TABS
5.0000 mg | ORAL_TABLET | Freq: Every day | ORAL | Status: DC
  Administered 2018-04-15 – 2018-04-22 (×8): 5 mg via ORAL
  Filled 2018-04-15 (×9): qty 1

## 2018-04-15 MED ORDER — INSULIN ASPART 100 UNIT/ML ~~LOC~~ SOLN
0.0000 [IU] | Freq: Every day | SUBCUTANEOUS | Status: DC
  Administered 2018-04-16: 3 [IU] via SUBCUTANEOUS
  Filled 2018-04-15: qty 1

## 2018-04-15 MED ORDER — SODIUM CHLORIDE 0.9 % IV BOLUS: 1000.0000 mL | Freq: Once | INTRAVENOUS | Status: DC

## 2018-04-15 MED ORDER — INSULIN ASPART 100 UNIT/ML ~~LOC~~ SOLN
0.0000 [IU] | Freq: Three times a day (TID) | SUBCUTANEOUS | Status: DC
  Administered 2018-04-15: 1 [IU] via SUBCUTANEOUS
  Administered 2018-04-17: 2 [IU] via SUBCUTANEOUS
  Filled 2018-04-15 (×2): qty 1

## 2018-04-15 MED ORDER — SODIUM CHLORIDE 0.9% FLUSH
3.0000 mL | Freq: Two times a day (BID) | INTRAVENOUS | Status: DC
  Administered 2018-04-15 – 2018-04-22 (×9): 3 mL via INTRAVENOUS

## 2018-04-15 MED ORDER — HYDROXYZINE HCL 25 MG PO TABS
25.0000 mg | ORAL_TABLET | Freq: Two times a day (BID) | ORAL | Status: DC | PRN
  Administered 2018-04-15: 25 mg via ORAL
  Filled 2018-04-15 (×2): qty 1

## 2018-04-15 MED ORDER — CHLORHEXIDINE GLUCONATE 0.12 % MT SOLN
15.0000 mL | Freq: Two times a day (BID) | OROMUCOSAL | Status: DC
  Administered 2018-04-16 – 2018-04-22 (×11): 15 mL via OROMUCOSAL
  Filled 2018-04-15 (×11): qty 15

## 2018-04-15 MED ORDER — SODIUM CHLORIDE 0.9 % IV SOLN
1.0000 g | INTRAVENOUS | Status: DC
  Administered 2018-04-15: 1 g via INTRAVENOUS
  Filled 2018-04-15 (×2): qty 10

## 2018-04-15 MED ORDER — PANTOPRAZOLE SODIUM 40 MG PO TBEC
40.0000 mg | DELAYED_RELEASE_TABLET | Freq: Every day | ORAL | Status: DC
  Administered 2018-04-15 – 2018-04-22 (×8): 40 mg via ORAL
  Filled 2018-04-15 (×8): qty 1

## 2018-04-15 MED ORDER — HYDRALAZINE HCL 20 MG/ML IJ SOLN
10.0000 mg | Freq: Once | INTRAMUSCULAR | Status: AC
  Administered 2018-04-15: 10 mg via INTRAVENOUS
  Filled 2018-04-15: qty 1

## 2018-04-15 MED ORDER — SPIRONOLACTONE 25 MG PO TABS
25.0000 mg | ORAL_TABLET | Freq: Every day | ORAL | Status: DC
  Administered 2018-04-15: 25 mg via ORAL
  Filled 2018-04-15: qty 1

## 2018-04-15 MED ORDER — ONDANSETRON HCL 4 MG/2ML IJ SOLN: 4.0000 mg | Freq: Four times a day (QID) | INTRAMUSCULAR | Status: DC | PRN

## 2018-04-15 NOTE — ED Notes (Signed)
Verbal order for 0.4 SL nitro by Dr. Marcille Blanco

## 2018-04-15 NOTE — Progress Notes (Signed)
ANTICOAGULATION CONSULT NOTE - Initial Consult  Pharmacy Consult for heparin drip Indication: chest pain/ACS  Allergies  Allergen Reactions  . Aspirin Swelling  . Codeine Itching  . Naprosyn [Naproxen] Other (See Comments)    Per MAR pt allergic  . Other     Elastic Bandages/ Supports Per MAR  . Tape Hives  . Valacyclovir     Patient Measurements: Height: 5\' 5"  (165.1 cm) Weight: 213 lb (96.6 kg) IBW/kg (Calculated) : 57 Heparin Dosing Weight: 79 kg  Vital Signs: Temp: 98.5 F (36.9 C) (05/04 0508) Temp Source: Oral (05/04 0508) BP: 97/68 (05/04 0532) Pulse Rate: 107 (05/04 0540)  Labs: Recent Labs    04/15/18 0506  HGB 12.1  HCT 35.9  PLT 277  CREATININE 0.68  TROPONINI 1.80*    Estimated Creatinine Clearance: 70.9 mL/min (by C-G formula based on SCr of 0.68 mg/dL).   Medical History: Past Medical History:  Diagnosis Date  . Arthritis   . Cardiomyopathy ([place])   . Cataract   . Cerebral hemorrhage ([place])   . CHF (congestive heart failure) ([place])   . Chronic kidney disease   . Clotting disorder ([place])   . COPD (chronic obstructive pulmonary disease) ([place])   . Depression   . Diabetes mellitus without complication ([place])   . Hypertension   . Mixed incontinence   . Obesity   . Sleep apnea   . Thyroid disease   . Urinary frequency   . Vaginal atrophy   . Varicose veins     Medications:  No anticoagulation in PTA meds.  Assessment: Trop 1.8  Goal of Therapy:  Heparin level 0.3-0.7 units/ml Monitor platelets by anticoagulation protocol: Yes   Plan:  4000 unit bolus and initial rate of 950 units/hr. First heparin level 8 hours after start of infusion.  Ladajah Soltys S 04/15/2018,6:12 AM
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
## 2018-04-15 NOTE — ED Notes (Signed)
Attempted to call report. Awaiting return call from receiving nurse.
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
## 2018-04-15 NOTE — Progress Notes (Signed)
NT reports BP of 77/50.  Repeat 72/44, MAP 54. HR 69, O2 sats 96%.  Patient is lethargic but easily arousable.  Dr. Margaretmary Eddy Informed. Cardiology and pulmonary consults ordered. Possible cardiogenic shock, orders to transfer to ICU. NS 1,000 bolus started.

## 2018-04-15 NOTE — ED Notes (Signed)
Pt continues to c/o leg spasms. Pt denies chest pain at this time. Unable to give meds for pain due to BP. Dr Marcille Blanco aware.
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
## 2018-04-15 NOTE — Progress Notes (Signed)
ANTICOAGULATION CONSULT NOTE - Initial Consult  Pharmacy Consult for heparin drip Indication: chest pain/ACS  Allergies  Allergen Reactions  . Aspirin Swelling  . Codeine Itching  . Naprosyn [Naproxen] Other (See Comments)    Per MAR pt allergic  . Other     Elastic Bandages/ Supports Per MAR  . Tape Hives  . Valacyclovir     Patient Measurements: Height: 5\' 5"  (165.1 cm) Weight: 217 lb 9.6 oz (98.7 kg) IBW/kg (Calculated) : 57 Heparin Dosing Weight: 79 kg  Vital Signs: Temp: 98.1 F (36.7 C) (05/04 0833) Temp Source: Oral (05/04 0508) BP: 92/70 (05/04 1210) Pulse Rate: 86 (05/04 1210)  Labs: Recent Labs    04/15/18 0506 04/15/18 0852 04/15/18 1416 04/15/18 1539  HGB 12.1  --   --   --   HCT 35.9  --   --   --   PLT 277  --   --   --   APTT 29  --   --   --   LABPROT 12.8  --   --   --   INR 0.97  --   --   --   HEPARINUNFRC  --   --   --  0.16*  CREATININE 0.68  --   --   --   TROPONINI 1.80* 2.84* 3.31*  --     Estimated Creatinine Clearance: 71.8 mL/min (by C-G formula based on SCr of 0.68 mg/dL).   Medical History: Past Medical History:  Diagnosis Date  . Arthritis   . Cardiomyopathy ([place])   . Cataract   . Cerebral hemorrhage ([place])   . CHF (congestive heart failure) ([place])   . Chronic kidney disease   . Clotting disorder ([place])   . COPD (chronic obstructive pulmonary disease) ([place])   . Depression   . Diabetes mellitus without complication ([place])   . Hypertension   . Mixed incontinence   . Obesity   . Sleep apnea   . Thyroid disease   . Urinary frequency   . Vaginal atrophy   . Varicose veins     Medications:  No anticoagulation in PTA meds.  Assessment: Trop 1.8  Goal of Therapy:  Heparin level 0.3-0.7 units/ml Monitor platelets by anticoagulation protocol: Yes   Plan:  4000 unit bolus and initial rate of 950 units/hr. First heparin level 8 hours after start of infusion.  5/4 1539 HL 0.16- Level is subtherapeutic. Will order 2000  unit bolus and increase infusion to 1150u/hr. Recheck heparin level in 8 hours.   Pernell Dupre, PharmD, BCPS Clinical Pharmacist 04/15/2018 4:37 PM

## 2018-04-15 NOTE — Progress Notes (Signed)
PT Cancellation Note  Patient Details Name: Sara Pierce MRN: [id number] DOB: 08/12/1941   Cancelled Treatment:    Reason Eval/Treat Not Completed: Medical issues which prohibited therapy(Consult received and chart reviewed.  Patient noted with change/decline in status with transfer to CCU.  Per policy, will require new orders to resume care.  Please re-consult as medically appropriate.)   Shadow Stiggers H. Owens Shark, PT, DPT, NCS 04/15/18, 8:32 PM [phone]

## 2018-04-15 NOTE — H&P (Signed)
Sara Pierce is an 77 y.o. female.   Chief Complaint: Leg spasms HPI: The patient with past medical history of COPD, CHF, hypertension, and diabetes presents to the emergency department complaining of spasms in her lower extremities.  Patient was just released from the hospital following an exacerbation of COPD.  She has been unable to walk since discharge she states that she has no feeling in her feet.  She also reports that her husband has neglected to give her her medications today and that she has been incontinent of urine without changing of her undergarments or sheets.  She denies fever, nausea or vomiting.  However in the emergency department the patient reported mild shortness of breath as well as palpitations.  She admitted to diaphoresis.  Telemetry repeatedly alarmed as V. tach which most the time were multiple PVCs but occasionally bigeminy and at one time actual sustained wide-complex QRS.  The patient was given sublingual nitroglycerin as well as morphine 1 mg IV.  Heparin bolus was started in the emergency department staff call the hospitalist service for admission.  Past Medical History:  Diagnosis Date  . Arthritis   . Cardiomyopathy ([place])   . Cataract   . Cerebral hemorrhage ([place])   . CHF (congestive heart failure) ([place])   . Chronic kidney disease   . Clotting disorder ([place])   . COPD (chronic obstructive pulmonary disease) ([place])   . Depression   . Diabetes mellitus without complication ([place])   . Hypertension   . Mixed incontinence   . Obesity   . Sleep apnea   . Thyroid disease   . Urinary frequency   . Vaginal atrophy   . Varicose veins     Past Surgical History:  Procedure Laterality Date  . APPENDECTOMY    . Cardiac Catherization    . COLONOSCOPY WITH PROPOFOL N/A 04/28/2016   Procedure: COLONOSCOPY WITH PROPOFOL;  Surgeon: Manya Silvas, MD;  Location: [facility] ENDOSCOPY;  Service: Endoscopy;  Laterality: N/A;  . COLONOSCOPY WITH PROPOFOL N/A 04/29/2016    Procedure: COLONOSCOPY WITH PROPOFOL;  Surgeon: Manya Silvas, MD;  Location: [facility] ENDOSCOPY;  Service: Endoscopy;  Laterality: N/A;  . JOINT REPLACEMENT    . STRABISMUS SURGERY    . TONSILLECTOMY    . TUBAL LIGATION      Family History  Problem Relation Age of Onset  . Heart attack Father   . Coronary artery disease Father   . Hypertension Father   . Hyperlipidemia Father   . Heart attack Mother   . Hypertension Mother   . Hyperlipidemia Mother   . Breast cancer Neg Hx    Social History:  reports that she has quit smoking. Her smoking use included cigarettes. She has a 22.00 pack-year smoking history. She has never used smokeless tobacco. She reports that she does not drink alcohol or use drugs.  Allergies:  Allergies  Allergen Reactions  . Aspirin Swelling  . Codeine Itching  . Naprosyn [Naproxen] Other (See Comments)    Per MAR pt allergic  . Other     Elastic Bandages/ Supports Per MAR  . Tape Hives  . Valacyclovir      (Not in a hospital admission)  Results for orders placed or performed during the hospital encounter of 04/15/18 (from the past 48 hour(s))  CBC with Differential     Status: Abnormal   Collection Time: 04/15/18  5:06 AM  Result Value Ref Range   WBC 12.5 (H) 3.6 - 11.0 K/uL  RBC 3.80 3.80 - 5.20 MIL/uL   Hemoglobin 12.1 12.0 - 16.0 g/dL   HCT 35.9 35.0 - 47.0 %   MCV 94.4 80.0 - 100.0 fL   MCH 31.9 26.0 - 34.0 pg   MCHC 33.8 32.0 - 36.0 g/dL   RDW 16.2 (H) 11.5 - 14.5 %   Platelets 277 150 - 440 K/uL   Neutrophils Relative % 81 %   Neutro Abs 10.2 (H) 1.4 - 6.5 K/uL   Lymphocytes Relative 14 %   Lymphs Abs 1.7 1.0 - 3.6 K/uL   Monocytes Relative 3 %   Monocytes Absolute 0.4 0.2 - 0.9 K/uL   Eosinophils Relative 1 %   Eosinophils Absolute 0.1 0 - 0.7 K/uL   Basophils Relative 1 %   Basophils Absolute 0.1 0 - 0.1 K/uL    Comment: Performed at [facility], [address] [place], [place] [address]  Basic metabolic panel      Status: Abnormal   Collection Time: 04/15/18  5:06 AM  Result Value Ref Range   Sodium 138 135 - 145 mmol/L   Potassium 4.5 3.5 - 5.1 mmol/L    Comment: HEMOLYSIS AT THIS LEVEL MAY AFFECT RESULT   Chloride 99 (L) 101 - 111 mmol/L   CO2 31 22 - 32 mmol/L   Glucose, Bld 130 (H) 65 - 99 mg/dL   BUN 26 (H) 6 - 20 mg/dL   Creatinine, Ser 0.68 0.44 - 1.00 mg/dL   Calcium 9.5 8.9 - 10.3 mg/dL   GFR calc non Af Amer >60 >60 mL/min   GFR calc Af Amer >60 >60 mL/min    Comment: (NOTE) The eGFR has been calculated using the CKD EPI equation. This calculation has not been validated in all clinical situations. eGFR's persistently <60 mL/min signify possible Chronic Kidney Disease.    Anion gap 8 5 - 15    Comment: Performed at [facility], [address] [place], [place] [address]  Troponin I     Status: Abnormal   Collection Time: 04/15/18  5:06 AM  Result Value Ref Range   Troponin I 1.80 (HH) <0.03 ng/mL    Comment: CRITICAL RESULT CALLED TO, READ BACK BY AND VERIFIED WITH SHERIE [facility] AT 0559 04/15/18.PMH Performed at [facility], [address] [place], [place] [address]   Urinalysis, Complete w Microscopic     Status: Abnormal   Collection Time: 04/15/18  5:06 AM  Result Value Ref Range   Color, Urine YELLOW (A) YELLOW   APPearance HAZY (A) CLEAR   Specific Gravity, Urine 1.011 1.005 - 1.030   pH 7.0 5.0 - 8.0   Glucose, UA NEGATIVE NEGATIVE mg/dL   Hgb urine dipstick NEGATIVE NEGATIVE   Bilirubin Urine NEGATIVE NEGATIVE   Ketones, ur NEGATIVE NEGATIVE mg/dL   Protein, ur NEGATIVE NEGATIVE mg/dL   Nitrite NEGATIVE NEGATIVE   Leukocytes, UA LARGE (A) NEGATIVE   RBC / HPF 0-5 0 - 5 RBC/hpf   WBC, UA >50 (H) 0 - 5 WBC/hpf   Bacteria, UA RARE (A) NONE SEEN   Squamous Epithelial / LPF NONE SEEN 0 - 5    Comment: Please note change in reference range. Performed at [facility], [address] [place], [place] [address]   APTT     Status: None    Collection Time: 04/15/18  5:06 AM  Result Value Ref Range   aPTT 29 24 - 36 seconds    Comment: Performed at [facility]  [place], [place] [address]  Protime-INR     Status: None   Collection Time: 04/15/18  5:06 AM  Result Value Ref Range   Prothrombin Time 12.8 11.4 - 15.2 seconds   INR 0.97     Comment: Performed at [facility], [address], [place], [place] [address]   Dg Chest [place] 1 View  Result Date: 04/15/2018 CLINICAL DATA:  77 year old female with cough. EXAM: PORTABLE CHEST 1 VIEW COMPARISON:  Chest CT dated 03/23/2018 FINDINGS: There is no focal consolidation, pleural effusion, or pneumothorax. The cardiac silhouette is within normal limits. Atherosclerotic calcification of the aorta. Old healed left humeral neck fracture and right shoulder hemiarthroplasty. No acute osseous pathology. IMPRESSION: No active disease. Electronically Signed   By: Anner Crete M.D.   On: 04/15/2018 06:00    Review of Systems  Constitutional: Negative for chills and fever.  HENT: Negative for sore throat and tinnitus.   Eyes: Negative for blurred vision and redness.  Respiratory: Negative for cough and shortness of breath.   Cardiovascular: Positive for chest pain and palpitations. Negative for orthopnea and PND.  Gastrointestinal: Negative for abdominal pain, diarrhea, nausea and vomiting.  Genitourinary: Negative for dysuria, frequency and urgency.  Musculoskeletal: Positive for myalgias. Negative for joint pain.  Skin: Negative for rash.       No lesions  Neurological: Negative for speech change, focal weakness and weakness.  Endo/Heme/Allergies: Does not bruise/bleed easily.       No temperature intolerance  Psychiatric/Behavioral: Negative for depression and suicidal ideas.    Blood pressure 115/72, pulse (!) 110, temperature 98.5 F (36.9 C), temperature source Oral, resp. rate 19, height '5\' 5"'$  (1.651 m), weight 96.6 kg (213 lb), SpO2 95  %. Physical Exam  Vitals reviewed. Constitutional: She is oriented to person, place, and time. She appears well-developed and well-nourished. No distress.  HENT:  Head: Normocephalic and atraumatic.  Mouth/Throat: Oropharynx is clear and moist.  Eyes: Pupils are equal, round, and reactive to light. Conjunctivae and EOM are normal. No scleral icterus.  Neck: Normal range of motion. Neck supple. No JVD present. No tracheal deviation present. No thyromegaly present.  Cardiovascular: Normal rate, regular rhythm and normal heart sounds. Exam reveals no gallop and no friction rub.  No murmur heard. Respiratory: Effort normal and breath sounds normal.  GI: Soft. Bowel sounds are normal. She exhibits no distension. There is no tenderness.  Genitourinary:  Genitourinary Comments: Deferred  Musculoskeletal: Normal range of motion. She exhibits no edema.  Lymphadenopathy:    She has no cervical adenopathy.  Neurological: She is alert and oriented to person, place, and time. No cranial nerve deficit. She exhibits normal muscle tone.  Skin: Skin is warm and dry. No rash noted. No erythema.  Psychiatric: She has a normal mood and affect. Her behavior is normal. Judgment and thought content normal.     Assessment/Plan This is a 77 year old female admitted for NSTEMI. 1.  NSTEMI: Troponin elevated to 1.8.  The patient does not have EKG changes at this time but telemetry repeatedly shows wide QRS complexes.  Electrolytes are within normal ranges.  Morphine has dropped the patient's pressure.  Thus we will give a normal saline bolus of 500 cc.  No aspirin given as the patient has respiratory distress as allergy.  To new therapeutic heparin.  Cardiology consulted.  Patient is n.p.o. in anticipation of possible cardiac catheterization. 2.  Hypertension: Initially controlled; resume lisinopril when blood pressure has normalized 3.  Diabetes mellitus type 2: Hold oral hypoglycemic agents.  Sliding cell insulin  while hospitalized next line 4.  OSA: CPAP nightly 5.  CHF: Appears to be resolved.  The patient does not clinically have heart failure at this time.  Continue Lasix and spironolactone for maintenance per home regimen 6.  COPD: Stable; continue prednisone as well as inhaled corticosteroid.  Albuterol as needed. 7.  Hypothyroidism: Check TSH; continue Cytomel 8.  DVT prophylaxis: Therapeutic anticoagulation 9.  GI prophylaxis: None The patient is a full code.  Time spent on admission was inpatient care approximately 45 minutes  Harrie Foreman, MD 04/15/2018, 6:46 AM

## 2018-04-15 NOTE — Consult Note (Addendum)
Name: Sara Pierce MRN: [id number] DOB: 1941/09/10     CONSULTATION DATE: 04/15/2018  HISTORY OF PRESENT ILLNESS:   77 years old lady with history of COPD, remote tobacco abuse, chronic respiratory failure on home O2 2 L/min nasal cannula, wheelchair-bound because of degenerative joint disease and morbid obesity.  The patient is admitted with an STEMI, has been on heparin drip.  While on the floor she was noticed to have troponin trending up to 3.31 and hypotension was recorded blood pressure of 73/43.  The patient has been followed by cardiology was planned for coronary angiogram and possible intervention on Monday 04/17/2018. All history was obtained from PCP, the patient and EMR. Patient arrived to the intensive care unit awake, in no distress, denied chest pain, with blood pressure recorded 184/160 3 repeat blood pressure systolic 578. PAST MEDICAL HISTORY :   has a past medical history of Arthritis, Cardiomyopathy ([place]), Cataract, Cerebral hemorrhage ([place]), CHF (congestive heart failure) ([place]), Chronic kidney disease, Clotting disorder ([place]), COPD (chronic obstructive pulmonary disease) ([place]), Depression, Diabetes mellitus without complication ([place]), Hypertension, Mixed incontinence, Obesity, Sleep apnea, Thyroid disease, Urinary frequency, Vaginal atrophy, and Varicose veins.  has a past surgical history that includes Appendectomy; Cardiac Catherization; Joint replacement; Strabismus surgery; Tonsillectomy; Tubal ligation; Colonoscopy with propofol (N/A, 04/28/2016); and Colonoscopy with propofol (N/A, 04/29/2016). Prior to Admission medications   Medication Sig Start Date End Date Taking? Authorizing Provider  albuterol (PROVENTIL HFA;VENTOLIN HFA) 108 (90 Base) MCG/ACT inhaler Inhale 2 puffs into the lungs every 6 (six) hours as needed for wheezing or shortness of breath.   Yes [provider]  ARIPiprazole (ABILIFY) 5 MG tablet Take 5 mg by mouth at bedtime.   Yes [provider]  budesonide (PULMICORT) 0.5 MG/2ML nebulizer solution Take 2 mLs (0.5 mg total) 2 (two) times daily by nebulization. 10/28/17  Yes Demetrios Loll, MD  buPROPion (WELLBUTRIN XL) 150 MG 24 hr tablet Take 150 mg by mouth daily.   Yes [provider]  clonazePAM (KLONOPIN) 1 MG tablet Take 1 mg by mouth 2 (two) times daily.   Yes [provider]  ergocalciferol (VITAMIN D2) 50000 units capsule Take 50,000 Units by mouth every 30 (thirty) days. Takes on the 12th   Yes [provider]  FLUoxetine (PROZAC) 40 MG capsule Take 80 mg by mouth daily.   Yes [provider]  furosemide (LASIX) 40 MG tablet Take 40 mg by mouth 2 (two) times daily.   Yes [provider]  gabapentin (NEURONTIN) 400 MG capsule Take 400 mg by mouth 3 (three) times daily.  03/20/18  Yes [provider]  glimepiride (AMARYL) 4 MG tablet Take 4 mg by mouth daily with breakfast.   Yes [provider]  hydrOXYzine (ATARAX/VISTARIL) 25 MG tablet Take 25 mg by mouth 2 (two) times daily as needed for anxiety.    Yes [provider]  liothyronine (CYTOMEL) 25 MCG tablet Take 25 mcg by mouth daily.    Yes [provider]  lisinopril (PRINIVIL,ZESTRIL) 10 MG tablet Take 10 mg by mouth daily.   Yes [provider]  metFORMIN (GLUCOPHAGE) 1000 MG tablet Take 1,000 mg by mouth 2 (two) times daily with a meal.   Yes [provider]  mirtazapine (REMERON) 15 MG tablet Take 7.5 mg by mouth at bedtime.   Yes [provider]  montelukast (SINGULAIR) 10 MG tablet Take 10 mg by mouth at bedtime.    Yes [provider]  naproxen (  NAPROSYN) 500 MG tablet Take 500 mg by mouth 2 (two) times daily with a meal.   Yes [provider]  omeprazole (PRILOSEC) 20 MG capsule Take 20 mg by mouth daily.   Yes [provider]  potassium chloride (K-DUR) 10 MEQ tablet Take 10 mEq by mouth 2 (two) times daily.    Yes [provider]  simvastatin (ZOCOR) 40 MG tablet Take 40 mg by mouth at bedtime.    Yes [provider]  sitaGLIPtin (JANUVIA) 100 MG tablet Take 100 mg by mouth daily.   Yes [provider]  spironolactone (ALDACTONE) 25 MG tablet Take 25 mg by mouth daily.   Yes [provider]  tiZANidine (ZANAFLEX) 2 MG tablet Take 2 mg by mouth every 6 (six) hours as needed for muscle spasms.    Yes [provider]  traMADol (ULTRAM) 50 MG tablet Take 50 mg by mouth every 6 (six) hours as needed for moderate pain.   Yes [provider]  umeclidinium-vilanterol (ANORO ELLIPTA) 62.5-25 MCG/INH AEPB Inhale 1 puff into the lungs daily.   Yes [provider]  doxycycline (VIBRA-TABS) 100 MG tablet Take 1 tablet (100 mg total) by mouth every 12 (twelve) hours. Patient not taking: Reported on 04/15/2018 03/27/18   Fritzi Mandes, MD  ferrous sulfate 325 (65 FE) MG tablet Take 1 tablet (325 mg total) 2 (two) times daily with a meal by mouth. Patient not taking: Reported on 04/15/2018 10/28/17   Demetrios Loll, MD  predniSONE (DELTASONE) 10 MG tablet Take 50 mg daily taper by 10 mg daily  Then stop Patient not taking: Reported on 04/15/2018 03/28/18   Fritzi Mandes, MD   Allergies  Allergen Reactions  . Aspirin Swelling  . Codeine Itching  . Naprosyn [Naproxen] Other (See Comments)    Per MAR pt allergic  . Other     Elastic Bandages/ Supports Per MAR  . Tape Hives  . Valacyclovir     FAMILY HISTORY:  family history includes Coronary artery disease in her father; Heart attack in her father and mother; Hyperlipidemia in her father and mother; Hypertension in her father and mother. SOCIAL HISTORY:  reports that she has quit smoking. Her smoking use included cigarettes. She has a 22.00 pack-year smoking history. She has never used smokeless tobacco. She reports that she does not drink alcohol or use drugs.  REVIEW OF SYSTEMS:   Unable to obtain due to critical  illness   VITAL SIGNS: Temp:  [97.6 F (36.4 C)-98.5 F (36.9 C)] 97.6 F (36.4 C) (05/04 1723) Pulse Rate:  [31-116] 67 (05/04 1745) Resp:  [12-21] 12 (05/04 1745) BP: (64-201)/(31-178) 184/163 (05/04 1745) SpO2:  [91 %-100 %] 98 % (05/04 1745) Weight:  [96.6 kg (213 lb)-98.7 kg (217 lb 9.6 oz)] 97.9 kg (215 lb 13.3 oz) (05/04 1723)  Physical Examination:  A + O and no acute neuro deficits [place], no distress, able to talk in full sentences, BEAE and no rales S1 & S2 audible and no murmur Benign abdomen with normal peristalses Wasted extremities and no edema  ASSESSMENT / PLAN:  Hypertensive emergency with NSTEMI -Optimize antihypertensives and monitor hemodynamics  NSTEMI with troponin continue to trend up, no chest pain -Heparin drip + Plavix for antiplatelet as the patient is allergic to aspirin + beta-blockers + statins. -Monitor cardiac enzymes and follow with cardiology.  Plan as per cardiology for coronary angiogram and possible revascularization on Monday 04/17/2018.  Chronic respiratory failure with history of  COPD on home O2 2 L/min nasal cannula -Monitor work of breathing and O2 sat -Bronchodilator and inhaled steroids.  Transient hypotension/measurement error.  With 2 readings of blood pressure initially 92/70 followed by 73/43.  However since arrival to the ACE unit systolic blood pressure has been more than 180. -Continue to monitor hemodynamics and optimize antihypertensives.  Prerenal azotemia with intravascular volume depletion and possible renal hypoperfusion -Optimize hydration, hemodynamics, avoid nephrotoxins, monitor renal panel and urine output.  UTI -Empiric Rocephin -Follows urine culture  Full code  DVT & GI prophylaxis.  Continue with supportive care Was discussed with the patient at the bedside  Critical care time 40 minutes   05/04 19:00  BP dropped with bradycardia, 73/34, HR 53/min. She was started on levophed ,NS fluid bolus, stat  lab and stat ECHO. Case was discussed with Dr. Ubaldo Glassing, agreed to previous and advised contacting interventional cardiologist on call. Case was discussed with Dr. Tamela Gammon (intervention cardiology), stat EKG was texted. Dr. Bertram Denver read EKG as no significant changes from previous considering base line RBBB and advised no need for emergency LHC. Awaiting Dr. Ubaldo Glassing evaluation. Awaiting ECHO and cardiology evaluation.  19:20 Dr. Ubaldo Glassing at the bedside, reported no regional wall motion abnormality on ECHO and advised medical management and no need for emergency LHC. BP on Levophed 124/44, MAP 67, HR 92. Lt. IJ was placed and no chest pain.

## 2018-04-15 NOTE — Progress Notes (Signed)
Pt arrived on unit alert and oriented at around 1700. BP was elevated with the systolic in the 388'T. MD ordered 10mg  of Hydralazine and her 1L bolus was stopped. About 15 min after receiving the hydralazine, pt blood pressure dropped with the systolic in the 19-59'D and the MAP in the 30's-40's. MD notified and came to bedside. Pt still oriented but complaining of increased leg spasms and seeing white spots in her field of vision. 1L NS bolus ordered. Pt was started on Levophed. This has been titrated to 58mcg per MD. CVC ordered and currently being placed. Stat EKG ordered. Stat BMP ordered. 1g of calcium gluconate ordered. Cardiologist paged. Stemi team paged as well. Report given to [facility].

## 2018-04-15 NOTE — ED Notes (Signed)
Pt denies CP at this time 

## 2018-04-15 NOTE — Procedures (Signed)
Central Venous Catheter Insertion Procedure Note DAWNYA GRAMS [id number] 09-28-1941  Procedure: Insertion of Central Venous Catheter Indications: Assessment of intravascular volume, Drug and/or fluid administration and Frequent blood sampling  Procedure Details Consent: Risks of procedure as well as the alternatives and risks of each were explained to the (patient/caregiver).  Consent for procedure obtained. Time Out: Verified patient identification, verified procedure, site/side was marked, verified correct patient position, special equipment/implants available, medications/allergies/relevent history reviewed, required imaging and test results available.  Performed  Maximum sterile technique was used including antiseptics, cap, gloves, gown, hand hygiene, mask and sheet. Skin prep: Chlorhexidine; local anesthetic administered A antimicrobial bonded/coated triple lumen catheter was placed in the left internal jugular vein using the Seldinger technique.  Evaluation Blood flow good Complications: No apparent complications Patient did tolerate procedure well. Chest X-ray ordered to verify placement.  CXR: normal.  Left IJ CVL placed utilizing ultrasound no complications noted during or following procedure.  Marda Stalker, [place] Pager [phone] (please enter 7 digits) PCCM Consult Pager [phone] (please enter 7 digits)
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
## 2018-04-15 NOTE — Progress Notes (Signed)
Family Meeting Note  Advance Directive:yes  Today a meeting took place with the Patient.  Pt is able  to participate  In conversation  The following clinical team members were present during this meeting:MD  The following were discussed:Patient's diagnosis: Non-STEMI, other comorbidities as documented below discussed with the patient.  The treatment plan of care was discussed in detail with the patient   Arthritis    . Cardiomyopathy ([place])   . Cataract   . Cerebral hemorrhage ([place])   . CHF (congestive heart failure) ([place])   . Chronic kidney disease   . Clotting disorder ([place])   . COPD (chronic obstructive pulmonary disease) ([place])   . Depression   . Diabetes mellitus without complication ([place])   . Hypertension   . Mixed incontinence   . Obesity   . Sleep apnea   . Thyroid disease   . Urinary frequency   . Vaginal atrophy   . Varicose veins      Patient's progosis: Unable to determine and Goals for treatment: Full Code, husband is a healthcare POA  Additional follow-up to be provided: Hospitalist, cardiology  Time spent during discussion:17 min  Sara Mango, MD

## 2018-04-15 NOTE — Progress Notes (Signed)
Dr. Ubaldo Glassing in to see patient. ECHO done. Central line placed by NP. Received 2 mg Morphine for leg spasms. Family was at bedside prior to central line insert. Aware of patient's condition and plan of care for this shift. Dr. Soyla Murphy also at bedside and monitoring blood pressure.

## 2018-04-16 ENCOUNTER — Inpatient Hospital Stay
Admit: 2018-04-16 | Discharge: 2018-04-16 | Disposition: A | Discharge status: Home / Self Care | Payer: Medicare Other | Attending: Internal Medicine | Admitting: Internal Medicine

## 2018-04-16 LAB — BASIC METABOLIC PANEL
Anion gap: 9 (ref 5–15)
BUN: 24 mg/dL — ABNORMAL HIGH (ref 6–20)
CALCIUM: 8.9 mg/dL (ref 8.9–10.3)
CHLORIDE: 104 mmol/L (ref 101–111)
CO2: 26 mmol/L (ref 22–32)
CREATININE: 0.79 mg/dL (ref 0.44–1.00)
GFR calc Af Amer: >60 mL/min (ref >60)
Glucose, Bld: 144 mg/dL — ABNORMAL HIGH (ref 65–99)
Potassium: 3.9 mmol/L (ref 3.5–5.1)
SODIUM: 139 mmol/L (ref 135–145)

## 2018-04-16 LAB — HEPARIN LEVEL (UNFRACTIONATED)
HEPARIN UNFRACTIONATED: 0.41 [IU]/mL (ref 0.30–0.70)
Heparin Unfractionated: 0.35 IU/mL (ref 0.30–0.70)
Heparin Unfractionated: 0.26 IU/mL — ABNORMAL LOW (ref 0.30–0.70)
Heparin Unfractionated: 0.36 IU/mL (ref 0.30–0.70)

## 2018-04-16 LAB — GLUCOSE, CAPILLARY
Glucose-Capillary: 136 mg/dL — ABNORMAL HIGH (ref 65–99)
Glucose-Capillary: 116 mg/dL — ABNORMAL HIGH (ref 65–99)
Glucose-Capillary: 132 mg/dL — ABNORMAL HIGH (ref 65–99)
Glucose-Capillary: 137 mg/dL — ABNORMAL HIGH (ref 65–99)
Glucose-Capillary: 219 mg/dL — ABNORMAL HIGH (ref 65–99)

## 2018-04-16 LAB — LIPID PANEL
CHOL/HDL RATIO: 3.2 ratio
Cholesterol: 181 mg/dL (ref 0–200)
HDL: 56 mg/dL (ref >40)
LDL Cholesterol: 105 mg/dL — ABNORMAL HIGH (ref 0–99)
Triglycerides: 98 mg/dL (ref <150)
VLDL: 20 mg/dL (ref 0–40)

## 2018-04-16 LAB — ECHOCARDIOGRAM COMPLETE
HEIGHTINCHES: 65 in
Weight: 3485.03 oz

## 2018-04-16 LAB — CBC
HCT: 33 % — ABNORMAL LOW (ref 35.0–47.0)
Hemoglobin: 11.2 g/dL — ABNORMAL LOW (ref 12.0–16.0)
MCH: 32.4 pg (ref 26.0–34.0)
MCHC: 33.9 g/dL (ref 32.0–36.0)
MCV: 95.6 fL (ref 80.0–100.0)
Platelets: 321 10*3/uL (ref 150–440)
RBC: 3.45 MIL/uL — ABNORMAL LOW (ref 3.80–5.20)
RDW: 16.5 % — ABNORMAL HIGH (ref 11.5–14.5)
WBC: 14.7 10*3/uL — ABNORMAL HIGH (ref 3.6–11.0)

## 2018-04-16 LAB — MAGNESIUM: Magnesium: 1.9 mg/dL (ref 1.7–2.4)

## 2018-04-16 LAB — PHOSPHORUS: Phosphorus: 3.9 mg/dL (ref 2.5–4.6)

## 2018-04-16 MED ORDER — DM-GUAIFENESIN ER 30-600 MG PO TB12: 1.0000 | ORAL_TABLET | Freq: Two times a day (BID) | ORAL | Status: DC

## 2018-04-16 MED ORDER — HEPARIN BOLUS VIA INFUSION
1200.0000 [IU] | Freq: Once | INTRAVENOUS | Status: AC
  Administered 2018-04-16: 1200 [IU] via INTRAVENOUS
  Filled 2018-04-16: qty 1200

## 2018-04-16 MED ORDER — PIPERACILLIN-TAZOBACTAM 3.375 G IVPB
3.3750 g | Freq: Three times a day (TID) | INTRAVENOUS | Status: DC
  Administered 2018-04-16 – 2018-04-17 (×3): 3.375 g via INTRAVENOUS
  Filled 2018-04-16 (×7): qty 50

## 2018-04-16 MED ORDER — GUAIFENESIN ER 600 MG PO TB12
600.0000 mg | ORAL_TABLET | Freq: Two times a day (BID) | ORAL | Status: DC
  Administered 2018-04-16 – 2018-04-22 (×13): 600 mg via ORAL
  Filled 2018-04-16 (×14): qty 1

## 2018-04-16 MED ORDER — DEXTROMETHORPHAN POLISTIREX ER 30 MG/5ML PO SUER
30.0000 mg | Freq: Two times a day (BID) | ORAL | Status: DC
  Administered 2018-04-16 – 2018-04-22 (×13): 30 mg via ORAL
  Filled 2018-04-16 (×17): qty 5
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
## 2018-04-16 NOTE — Progress Notes (Addendum)
Name: Sara Pierce MRN: [id number] DOB: 1941-12-10     CONSULTATION DATE: 04/15/2018  Subjective & Objective: She remains on Heparin gtt, Levophed, no chest pain  PAST MEDICAL HISTORY :   has a past medical history of Arthritis, Cardiomyopathy ([place]), Cataract, Cerebral hemorrhage ([place]), CHF (congestive heart failure) ([place]), Chronic kidney disease, Clotting disorder ([place]), COPD (chronic obstructive pulmonary disease) ([place]), Depression, Diabetes mellitus without complication ([place]), Hypertension, Mixed incontinence, Obesity, Sleep apnea, Thyroid disease, Urinary frequency, Vaginal atrophy, and Varicose veins.  has a past surgical history that includes Appendectomy; Cardiac Catherization; Joint replacement; Strabismus surgery; Tonsillectomy; Tubal ligation; Colonoscopy with propofol (N/A, 04/28/2016); and Colonoscopy with propofol (N/A, 04/29/2016). Prior to Admission medications   Medication Sig Start Date End Date Taking? Authorizing Provider  albuterol (PROVENTIL HFA;VENTOLIN HFA) 108 (90 Base) MCG/ACT inhaler Inhale 2 puffs into the lungs every 6 (six) hours as needed for wheezing or shortness of breath.   Yes [provider]  ARIPiprazole (ABILIFY) 5 MG tablet Take 5 mg by mouth at bedtime.   Yes [provider]  budesonide (PULMICORT) 0.5 MG/2ML nebulizer solution Take 2 mLs (0.5 mg total) 2 (two) times daily by nebulization. 10/28/17  Yes Demetrios Loll, MD  buPROPion (WELLBUTRIN XL) 150 MG 24 hr tablet Take 150 mg by mouth daily.   Yes [provider]  clonazePAM (KLONOPIN) 1 MG tablet Take 1 mg by mouth 2 (two) times daily.   Yes [provider]  ergocalciferol (VITAMIN D2) 50000 units capsule Take 50,000 Units by mouth every 30 (thirty) days. Takes on the 12th   Yes [provider]  FLUoxetine (PROZAC) 40 MG capsule Take 80 mg by mouth daily.   Yes [provider]  furosemide (LASIX) 40 MG tablet Take 40 mg by mouth 2 (two) times daily.    Yes [provider]  gabapentin (NEURONTIN) 400 MG capsule Take 400 mg by mouth 3 (three) times daily.  03/20/18  Yes [provider]  glimepiride (AMARYL) 4 MG tablet Take 4 mg by mouth daily with breakfast.   Yes [provider]  hydrOXYzine (ATARAX/VISTARIL) 25 MG tablet Take 25 mg by mouth 2 (two) times daily as needed for anxiety.    Yes [provider]  liothyronine (CYTOMEL) 25 MCG tablet Take 25 mcg by mouth daily.    Yes [provider]  lisinopril (PRINIVIL,ZESTRIL) 10 MG tablet Take 10 mg by mouth daily.   Yes [provider]  metFORMIN (GLUCOPHAGE) 1000 MG tablet Take 1,000 mg by mouth 2 (two) times daily with a meal.   Yes [provider]  mirtazapine (REMERON) 15 MG tablet Take 7.5 mg by mouth at bedtime.   Yes [provider]  montelukast (SINGULAIR) 10 MG tablet Take 10 mg by mouth at bedtime.    Yes [provider]  naproxen (NAPROSYN) 500 MG tablet Take 500 mg by mouth 2 (two) times daily with a meal.   Yes [provider]  omeprazole (PRILOSEC) 20 MG capsule Take 20 mg by mouth daily.   Yes [provider]  potassium chloride (K-DUR) 10 MEQ tablet Take 10 mEq by mouth 2 (two) times daily.    Yes [provider]  simvastatin (ZOCOR) 40 MG tablet Take 40 mg by mouth at bedtime.    Yes [provider]  sitaGLIPtin (JANUVIA) 100 MG tablet Take 100 mg by mouth daily.   Yes [provider]  spironolactone (ALDACTONE) 25 MG tablet Take 25 mg by mouth daily.  Yes [provider]  tiZANidine (ZANAFLEX) 2 MG tablet Take 2 mg by mouth every 6 (six) hours as needed for muscle spasms.    Yes [provider]  traMADol (ULTRAM) 50 MG tablet Take 50 mg by mouth every 6 (six) hours as needed for moderate pain.   Yes [provider]  umeclidinium-vilanterol (ANORO ELLIPTA) 62.5-25 MCG/INH AEPB Inhale 1 puff into the lungs daily.   Yes [provider]  doxycycline (VIBRA-TABS) 100 MG tablet Take 1 tablet (100 mg total) by mouth every 12 (twelve) hours. Patient not taking: Reported on 04/15/2018 03/27/18   Fritzi Mandes, MD  ferrous sulfate 325 (65 FE) MG tablet Take 1 tablet (325 mg total) 2 (two) times daily with a meal by mouth. Patient not taking: Reported on 04/15/2018 10/28/17   Demetrios Loll, MD  predniSONE (DELTASONE) 10 MG tablet Take 50 mg daily taper by 10 mg daily  Then stop Patient not taking: Reported on 04/15/2018 03/28/18   Fritzi Mandes, MD   Allergies  Allergen Reactions  . Aspirin Swelling  . Codeine Itching  . Naprosyn [Naproxen] Other (See Comments)    Per MAR pt allergic  . Other     Elastic Bandages/ Supports Per MAR  . Tape Hives  . Valacyclovir     FAMILY HISTORY:  family history includes Coronary artery disease in her father; Heart attack in her father and mother; Hyperlipidemia in her father and mother; Hypertension in her father and mother. SOCIAL HISTORY:  reports that she has quit smoking. Her smoking use included cigarettes. She has a 22.00 pack-year smoking history. She has never used smokeless tobacco. She reports that she does not drink alcohol or use drugs.  REVIEW OF SYSTEMS:   Unable to obtain due to critical illness   VITAL SIGNS: Temp:  [97.6 F (36.4 C)-98.8 F (37.1 C)] 98.6 F (37 C) (05/05 0800) Pulse Rate:  [58-111] 74 (05/05 1430) Resp:  [11-23] 18 (05/05 1430) BP: (51-201)/(20-178) 132/98 (05/05 1430) SpO2:  [94 %-100 %] 97 % (05/05 1430) Weight:  [97.9 kg (215 lb 13.3 oz)-98.8 kg (217 lb 13 oz)] 98.8 kg (217 lb 13 oz) (05/05 0420)  Physical Examination:  A + O and no acute neuro deficits On [place], no distress, able to talk in full sentences, BEAE and no rales S1 & S2 audible and no murmur Benign abdomen with normal peristalses Wasted extremities and no edema  ASSESSMENT / PLAN:  NSTEMI with uncontrolled BP, cardiogenic shock on Levophed gtt. ECHO LV EF 55-65%, no regional  wall motion abnormality and trivial pericardial effusion. -Optimize antihypertensives, monitor hemodynamics and cardiac enzymes -Heparin drip + Plavix for antiplatelet as the patient is allergic to aspirin + beta-blockers on hold + statins. Plan as pr cardiology for LHC in am  Chronic respiratory failure with history of COPD on home O2 2 L/min nasal cannula -Monitor work of breathing and O2 sat -Bronchodilator and inhaled steroids.  Prerenal azotemia (improved) with intravascular volume depletion and possible renal hypoperfusion -Optimize hydration, hemodynamics, avoid nephrotoxins, monitor renal panel and urine output.  UTI (Proteus Mirabilis) -Start n Zosyn and d/c Rocephin -Follows with culture result.  Atelectasis and pneumonia. Lt. L airspace disease -Zosyn -Monitor CXR + CBC + FIO2.  Chronic pain Lt.L extremities (described by patient as cramping pain) was triggered by by BP cuff applied to Lt calf. Patient is wheelchair bound because of degenerative change of the spines and arthritis. -Optimize analgesia and consider MRI lower back when hemodynamically stable.  Full code  DVT & GI prophylaxis.  Continue with supportive care Was discussed with the patient at the bedside  Critical care time 40 minutes

## 2018-04-16 NOTE — Progress Notes (Signed)
*  PRELIMINARY RESULTS* Echocardiogram 2D Echocardiogram has been performed.  Lavell Luster Emaly Boschert 04/16/2018, 11:39 AM

## 2018-04-16 NOTE — Progress Notes (Signed)
Patient sleep all night, and NP said it was okay to hold HS medications. Blood pressure has been on the low side most of the shift. Continue on Levophed gtt. Woke around 0545 and has been calling out for several things including mashed potatoes. BP high this AM but recheck was low, will recheck again. Has occasional leg spasms, and said she also has spasms in her arms. Continue to monitor.

## 2018-04-16 NOTE — Progress Notes (Signed)
Patient Name: Sara Pierce Date of Encounter: 04/16/2018  Hospital Problem List     Active Problems:   NSTEMI (non-ST elevated myocardial infarction) [facility]    Patient Profile     77 year old female with history of morbid with complaints with pain in her feet.  Troponin was elevated.  Patient developed hypotension and was transferred to the ICU.  Cardiac markers remain slightly elevated.  Electrocardiogram showed no acute change.  Requires Levophed for pressure control.  Subjective   Patient feels better this morning.  States her feet hurt.  Inpatient Medications    . ARIPiprazole  5 mg Oral QHS  . atorvastatin  40 mg Oral q1800  . budesonide  0.5 mg Nebulization BID  . buPROPion  150 mg Oral Daily  . chlorhexidine  15 mL Mouth Rinse BID  . clopidogrel  75 mg Oral Daily  . docusate sodium  100 mg Oral BID  . FLUoxetine  80 mg Oral Daily  . gabapentin  400 mg Oral TID  . insulin aspart  0-5 Units Subcutaneous QHS  . insulin aspart  0-9 Units Subcutaneous TID WC  . linagliptin  5 mg Oral Daily  . liothyronine  25 mcg Oral Daily  . mirtazapine  7.5 mg Oral QHS  . montelukast  10 mg Oral QHS  . pantoprazole  40 mg Oral Daily  . sodium chloride flush  3 mL Intravenous Q12H  . sodium chloride flush  3 mL Intravenous Q12H  . umeclidinium-vilanterol  1 puff Inhalation Daily  . [START ON 04/23/2018] Vitamin D (Ergocalciferol)  50,000 Units Oral Q30 days    Vital Signs    Vitals:   04/16/18 0730 04/16/18 0733 04/16/18 0800 04/16/18 0830  BP: 101/73  (!) 98/56 (!) 95/43  Pulse: 74  79 69  Resp: 13  (!) 23 13  Temp:      TempSrc:      SpO2: 97% 96% 98% 98%  Weight:      Height:        Intake/Output Summary (Last 24 hours) at 04/16/2018 0908 Last data filed at 04/16/2018 0800 Gross per 24 hour  Intake 3851.56 ml  Output 1000 ml  Net 2851.56 ml   Filed Weights   04/15/18 0833 04/15/18 1723 04/16/18 0420  Weight: 98.7 kg (217 lb 9.6 oz) 97.9 kg (215 lb 13.3 oz) 98.8 kg  (217 lb 13 oz)    Physical Exam    GEN: Well nourished, well developed, in no acute distress.  HEENT: normal.  Neck: Supple, no JVD, carotid bruits, or masses. Cardiac: RRR, no murmurs, rubs, or gallops. No clubbing, cyanosis, edema.  Radials/DP/PT 2+ and equal bilaterally.  Respiratory:  Respirations regular and unlabored, clear to auscultation bilaterally. GI: Soft, nontender, nondistended, BS + x 4. MS: no deformity or atrophy. Skin: warm and dry, no rash. Neuro:  Strength and sensation are intact. Psych: Normal affect.  Labs    CBC Recent Labs    04/15/18 0506 04/15/18 1929 04/16/18 0517  WBC 12.5* 11.5* 14.7*  NEUTROABS 10.2* 9.0*  --   HGB 12.1 11.1* 11.2*  HCT 35.9 33.2* 33.0*  MCV 94.4 94.9 95.6  PLT 277 284 202   Basic Metabolic Panel Recent Labs    04/15/18 1929 04/16/18 0517  NA 141 139  K 3.6 3.9  CL 104 104  CO2 30 26  GLUCOSE 127* 144*  BUN 28* 24*  CREATININE 1.13* 0.79  CALCIUM 8.9 8.9  MG 2.1 1.9  PHOS 4.1  3.9   Liver Function Tests No results for input(s): AST, ALT, ALKPHOS, BILITOT, PROT, ALBUMIN in the last 72 hours. No results for input(s): LIPASE, AMYLASE in the last 72 hours. Cardiac Enzymes Recent Labs    04/15/18 0852 04/15/18 1416 04/15/18 2032  TROPONINI 2.84* 3.31* 3.38*   BNP No results for input(s): BNP in the last 72 hours. D-Dimer No results for input(s): DDIMER in the last 72 hours. Hemoglobin A1C No results for input(s): HGBA1C in the last 72 hours. Fasting Lipid Panel Recent Labs    04/16/18 0517  CHOL 181  HDL 56  LDLCALC 105*  TRIG 98  CHOLHDL 3.2   Thyroid Function Tests Recent Labs    04/15/18 0852  TSH 1.357    Telemetry    Sinus rhythm with right bundle branch block  ECG    Sinus rhythm with right bundle branch block.  Nonspecific ST-T wave changes.  Radiology    Dg Chest 2 View  Result Date: 03/23/2018 CLINICAL DATA:  Congestion, productive cough, pneumonia, shortness of breath,  chest pain, history CHF, COPD, cardiomyopathy EXAM: CHEST - 2 VIEW COMPARISON:  01/05/2018 FINDINGS: Slightly rotated to the LEFT. Borderline enlargement of cardiac silhouette. Atherosclerotic calcification aorta. Enlarged central pulmonary arteries question pulmonary arterial hypertension. Bibasilar and RIGHT mid lung atelectasis. No definite acute infiltrate, pleural effusion or pneumothorax. Abnormal RIGHT paramediastinal density superior to the RIGHT hilum at the level of the aortic arch is likely related to the marked compression fracture of the T6 vertebral body with paraspinal calcific debris and spurring as noted on prior CT exams, accentuated by rotation to the LEFT. Bones demineralized with note of a RIGHT shoulder prosthesis. IMPRESSION: Bibasilar atelectasis. No new abnormalities. Electronically Signed   By: Lavonia Dana M.D.   On: 03/23/2018 12:37   Ct Chest Wo Contrast  Result Date: 03/23/2018 CLINICAL DATA:  COPD exacerbation.  Dyspnea.  Productive cough. EXAM: CT CHEST WITHOUT CONTRAST TECHNIQUE: Multidetector CT imaging of the chest was performed following the standard protocol without IV contrast. COMPARISON:  Chest radiograph from earlier today. 11/26/2017 chest CT. FINDINGS: Cardiovascular: Normal heart size. No significant pericardial fluid/thickening. Left main and 3 vessel coronary atherosclerosis. Atherosclerotic nonaneurysmal thoracic aorta. Stable prominently dilated main pulmonary artery (4.2 cm diameter). Mediastinum/Nodes: Hypodense 1.0 cm left thyroid lobe nodule. Unremarkable esophagus. No pathologically enlarged axillary, mediastinal or gross hilar lymph nodes, noting limited sensitivity for the detection of hilar adenopathy on this noncontrast study. Lungs/Pleura: No pneumothorax. No pleural effusion. Chronic bandlike opacities in the dependent right upper lobe and dependent/medial bilateral lower lobes with associated volume loss, compatible with chronic  postinfectious/postinflammatory scarring. No acute consolidative airspace disease or lung masses. Sub solid 4 mm right middle lobe pulmonary nodule (series 3/image 80) is stable since 10/24/2017 chest CT. No new significant pulmonary nodules. Upper abdomen: Small hiatal hernia. Stable thickening of the left greater than right adrenal glands without discrete adrenal nodules. Musculoskeletal: No aggressive appearing focal osseous lesions. Stable chronic severe T6 vertebral compression fracture. Marked thoracic spondylosis. Partially visualized right shoulder arthroplasty. Stable subcutaneous upper right back 1.8 cm sebaceous cyst. IMPRESSION: 1. Stable chronic postinfectious/postinflammatory scarring in the dependent right upper lobe and dependent bilateral lower lobes. No superimposed acute consolidative airspace disease. 2. Sub solid 4 mm right middle lobe pulmonary nodule, stable since 10/24/2017 chest CT, probably benign. No follow-up recommended. This recommendation follows the consensus statement: Guidelines for Management of Incidental Pulmonary Nodules Detected on CT Images: From the Fleischner Society 2017; Radiology 2017; 284:228-243. 3.  Stable prominently dilated main pulmonary artery, suggesting chronic pulmonary arterial hypertension. 4. Left main and 3 vessel coronary atherosclerosis. 5. Small hiatal hernia. Aortic Atherosclerosis (ICD10-I70.0). Electronically Signed   By: Ilona Sorrel M.D.   On: 03/23/2018 15:36   Dg Chest Port 1 View  Result Date: 04/15/2018 CLINICAL DATA:  Central line placement. EXAM: PORTABLE CHEST 1 VIEW COMPARISON:  Radiograph of Apr 15, 2018. FINDINGS: Stable cardiomediastinal silhouette. Atherosclerosis of thoracic aorta is noted. Interval placement of left internal jugular catheter with distal tip in expected position of the SVC. No pneumothorax is noted. Right lung is clear. Mild left basilar subsegmental atelectasis or scarring is noted with possible minimal left pleural  effusion. Bony thorax is unremarkable. IMPRESSION: Interval placement of left internal jugular catheter with distal tip in expected position of the SVC. No pneumothorax is noted. Mild left basilar subsegmental atelectasis or scarring is noted with possible minimal left pleural effusion. Electronically Signed   By: Marijo Conception, M.D.   On: 04/15/2018 20:23   Dg Chest Port 1 View  Result Date: 04/15/2018 CLINICAL DATA:  77 year old female with cough. EXAM: PORTABLE CHEST 1 VIEW COMPARISON:  Chest CT dated 03/23/2018 FINDINGS: There is no focal consolidation, pleural effusion, or pneumothorax. The cardiac silhouette is within normal limits. Atherosclerotic calcification of the aorta. Old healed left humeral neck fracture and right shoulder hemiarthroplasty. No acute osseous pathology. IMPRESSION: No active disease. Electronically Signed   By: Anner Crete M.D.   On: 04/15/2018 06:00    Assessment & Plan    Elevated serum troponin-etiology unclear.  EKG shows no obvious injury or ischemia.  Developed hypotension last night with significant changes on electrocardiogram.  Cardiac markers mildly increased from previous study.  Echo pending.  Consider left heart cath in the morning if renal function and hemodynamics allow.  Patient again still has no chest pain.  Hypotension-etiology unclear.  Echo pending.  Continue with Levophed and wean as tolerated  Lower extremity cramping-etiology unclear.  Will need further evaluation.  Signed, Javier Docker Gurpreet Mariani MD 04/16/2018, 9:08 AM  Pager: [phone]

## 2018-04-16 NOTE — NC FL2 (Signed)
[place] LEVEL OF CARE SCREENING TOOL     IDENTIFICATION  Patient Name: Sara Pierce Birthdate: 04/01/1941 Sex: female Admission Date (Current Location): 04/15/2018  [place] and [place] Number:  [name] [phone] [place] and Address:  [facility], [address] [place], [place] [address]      Provider Number: [id number]  Attending Physician Name and Address:  Nicholes Mango, MD  Relative Name and Phone Number:  Joylyn Duggin (spouse) [phone]    Current Level of Care: Hospital Recommended Level of Care: [place] Prior Approval Number:    Date Approved/Denied:   PASRR Number: [id number]  Discharge Plan: SNF    Current Diagnoses: Patient Active Problem List   Diagnosis Date Noted  . NSTEMI (non-ST elevated myocardial infarction) ([place]) 04/15/2018  . COPD exacerbation ([place]) 03/23/2018  . COPD (chronic obstructive pulmonary disease) ([place]) 01/04/2018  . Pressure injury of skin 10/25/2017  . COPD with exacerbation ([place]) 10/24/2017  . HCAP (healthcare-associated pneumonia) 08/19/2017  . CAP (community acquired pneumonia) 10/03/2016  . Sepsis ([place]) 11/15/2015  . Absolute anemia 03/15/2015  . Anxiety 03/15/2015  . Arthritis 03/15/2015  . Cardiomyopathy, secondary ([place]) 03/15/2015  . Cataract 03/15/2015  . Brain bleed ([place]) 03/15/2015  . Chronic constipation 03/15/2015  . Chronic kidney failure 03/15/2015  . Blood clotting disorder ([place]) 03/15/2015  . CAFL (chronic airflow limitation) ([place]) 03/15/2015  . Clinical depression 03/15/2015  . Diabetes ([place]) 03/15/2015  . H/O varicella 03/15/2015  . BP (high blood pressure) 03/15/2015  . Mixed incontinence 03/15/2015  . 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 Adiposity 03/15/2015  . Apnea, sleep 03/15/2015  . Disease of thyroid gland 03/15/2015  . FOM (frequency of micturition) 03/15/2015  . 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 Candida vaginitis 03/15/2015  . Abnormal ECG 03/15/2015    Orientation RESPIRATION BLADDER Height & Weight     Self, Time, Situation, Place  O2(3L o2 chronic) Incontinent Weight: 217 lb 13 oz (98.8 kg) Height:  5\' 5"  (165.1 cm)  BEHAVIORAL SYMPTOMS/MOOD NEUROLOGICAL BOWEL NUTRITION STATUS      Continent Diet(Heart Healthy/Carb modified)  AMBULATORY STATUS COMMUNICATION OF NEEDS Skin   Extensive Assist Verbally Bruising                       Personal Care Assistance Level of Assistance  Bathing, Feeding, Dressing Bathing Assistance: Limited assistance Feeding assistance: Independent Dressing Assistance: Limited assistance     Functional Limitations Info             SPECIAL CARE FACTORS FREQUENCY                       Contractures Contractures Info: Not present    Additional Factors Info  Code Status, Allergies, Psychotropic Code Status Info: Full Allergies Info: Aspirin, Codeine, Naprosyn Naproxen, Other, Tape, Valacyclovir Psychotropic Info: Abilify, Wellbutrin, Prozac, Gabapentin         Current Medications (04/16/2018):  This is the current hospital active medication list Current Facility-Administered Medications  Medication Dose Route Frequency Provider Last Rate Last Dose  . 0.9 %  sodium chloride infusion   Intravenous Continuous Harrie Foreman, MD 125 mL/hr at 04/16/18 0800    . acetaminophen (TYLENOL) tablet 650 mg  650 mg Oral Q6H PRN Harrie Foreman, MD       Or  . acetaminophen (TYLENOL) suppository 650 mg  650 mg Rectal Q6H PRN Harrie Foreman,  MD      . albuterol (PROVENTIL) (2.5 MG/3ML) 0.083% nebulizer solution 3 mL  3 mL Inhalation Q6H PRN Harrie Foreman, MD      . ARIPiprazole (ABILIFY) tablet 5 mg  5 mg Oral QHS Harrie Foreman, MD   Stopped at 04/15/18 2246  . atorvastatin (LIPITOR) tablet 40 mg  40 mg Oral q1800 Teodoro Spray, MD   40 mg at 04/15/18 1753  . budesonide (PULMICORT) nebulizer solution 0.5 mg  0.5 mg  Nebulization BID Harrie Foreman, MD   0.5 mg at 04/16/18 6644  . buPROPion (WELLBUTRIN XL) 24 hr tablet 150 mg  150 mg Oral Daily Harrie Foreman, MD   150 mg at 04/16/18 0911  . cefTRIAXone (ROCEPHIN) 1 g in sodium chloride 0.9 % 100 mL IVPB  1 g Intravenous Q24H Cassandria Santee, MD   Stopped at 04/15/18 2009  . chlorhexidine (PERIDEX) 0.12 % solution 15 mL  15 mL Mouth Rinse BID Gouru, Aruna, MD   15 mL at 04/16/18 0919  . clopidogrel (PLAVIX) tablet 75 mg  75 mg Oral Daily Cassandria Santee, MD   75 mg at 04/16/18 0911  . docusate sodium (COLACE) capsule 100 mg  100 mg Oral BID Harrie Foreman, MD   100 mg at 04/16/18 0911  . FLUoxetine (PROZAC) capsule 80 mg  80 mg Oral Daily Harrie Foreman, MD   80 mg at 04/16/18 0911  . gabapentin (NEURONTIN) capsule 400 mg  400 mg Oral TID Harrie Foreman, MD   400 mg at 04/16/18 0911  . heparin ADULT infusion 100 units/mL (25000 units/274mL sodium chloride 0.45%)  1,300 Units/hr Intravenous Continuous Harrie Foreman, MD 13 mL/hr at 04/16/18 0800 1,300 Units/hr at 04/16/18 0800  . insulin aspart (novoLOG) injection 0-5 Units  0-5 Units Subcutaneous QHS Harrie Foreman, MD      . insulin aspart (novoLOG) injection 0-9 Units  0-9 Units Subcutaneous TID WC Harrie Foreman, MD   1 Units at 04/15/18 1213  . linagliptin (TRADJENTA) tablet 5 mg  5 mg Oral Daily Harrie Foreman, MD   5 mg at 04/16/18 0911  . liothyronine (CYTOMEL) tablet 25 mcg  25 mcg Oral Daily Harrie Foreman, MD   25 mcg at 04/16/18 0911  . mirtazapine (REMERON) tablet 7.5 mg  7.5 mg Oral QHS Harrie Foreman, MD   Stopped at 04/15/18 2247  . montelukast (SINGULAIR) tablet 10 mg  10 mg Oral QHS Harrie Foreman, MD   Stopped at 04/15/18 2247  . nitroGLYCERIN (NITROSTAT) SL tablet 0.4 mg  0.4 mg Sublingual Q5 min PRN Harrie Foreman, MD   0.4 mg at 04/15/18 0347  . norepinephrine (LEVOPHED) 16 mg in dextrose 5 % 250 mL (0.064 mg/mL) infusion  0-40 mcg/min  Intravenous Titrated Awilda Bill, NP 20.6 mL/hr at 04/16/18 0920 22 mcg/min at 04/16/18 0920  . ondansetron (ZOFRAN) tablet 4 mg  4 mg Oral Q6H PRN Harrie Foreman, MD       Or  . ondansetron [facility]) injection 4 mg  4 mg Intravenous Q6H PRN Harrie Foreman, MD      . pantoprazole (PROTONIX) EC tablet 40 mg  40 mg Oral Daily Harrie Foreman, MD   40 mg at 04/16/18 0911  . sodium chloride 0.9 % bolus 1,000 mL  1,000 mL Intravenous Once Gouru, Aruna, MD      . sodium chloride flush (NS) 0.9 % injection 3 mL  3 mL Intravenous Q12H Gladstone Lighter, MD   3 mL at 04/15/18 2200  . sodium chloride flush (NS) 0.9 % injection 3 mL  3 mL Intravenous Q12H Gladstone Lighter, MD   3 mL at 04/15/18 2200  . sodium chloride flush (NS) 0.9 % injection 3 mL  3 mL Intravenous PRN Gladstone Lighter, MD      . tiZANidine (ZANAFLEX) tablet 2 mg  2 mg Oral Q6H PRN Harrie Foreman, MD   2 mg at 04/16/18 1004  . traMADol (ULTRAM) tablet 50 mg  50 mg Oral Q6H PRN Harrie Foreman, MD   50 mg at 04/16/18 0919  . umeclidinium-vilanterol (ANORO ELLIPTA) 62.5-25 MCG/INH 1 puff  1 puff Inhalation Daily Harrie Foreman, MD   1 puff at 04/16/18 [phone]  . [START ON 04/23/2018] Vitamin D (Ergocalciferol) (DRISDOL) capsule 50,000 Units  50,000 Units Oral Q30 days Harrie Foreman, MD         Discharge Medications: Please see discharge summary for a list of discharge medications.  Relevant Imaging Results:  Relevant Lab Results:   Additional Information [phone]  Zettie Pho, LCSW

## 2018-04-16 NOTE — Progress Notes (Signed)
RN called and notified that patient is altered with lethargy, hypotensive and map is 53.  Repeat troponin trended up from 2.8-3.31.  IV fluid bolus was ordered and patient is transferred to intensive care unit as there is a possibility patient going into cardiogenic shock.  Intensivist and cardiology Dr. Ubaldo Glassing notified.  Call placed and discussed with patient's husband Mr. Strollo at [phone].  He is aware of the patient's critical situation

## 2018-04-16 NOTE — Progress Notes (Addendum)
ANTICOAGULATION CONSULT NOTE - Initial Consult  Pharmacy Consult for heparin drip Indication: chest pain/ACS  Allergies  Allergen Reactions  . Aspirin Swelling  . Codeine Itching  . Naprosyn [Naproxen] Other (See Comments)    Per MAR pt allergic  . Other     Elastic Bandages/ Supports Per MAR  . Tape Hives  . Valacyclovir     Patient Measurements: Height: 5\' 5"  (165.1 cm) Weight: 215 lb 13.3 oz (97.9 kg) IBW/kg (Calculated) : 57 Heparin Dosing Weight: 79 kg  Vital Signs: Temp: 98.8 F (37.1 C) (05/05 0000) Temp Source: Axillary (05/04 2000) BP: 123/57 (05/05 0045) Pulse Rate: 70 (05/05 0045)  Labs: Recent Labs    04/15/18 0506 04/15/18 0852 04/15/18 1416 04/15/18 1539 04/15/18 1929 04/15/18 2032 04/16/18 0120  HGB 12.1  --   --   --  11.1*  --   --   HCT 35.9  --   --   --  33.2*  --   --   PLT 277  --   --   --  284  --   --   APTT 29  --   --   --   --   --   --   LABPROT 12.8  --   --   --   --   --   --   INR 0.97  --   --   --   --   --   --   HEPARINUNFRC  --   --   --  0.16*  --   --  0.35  CREATININE 0.68  --   --   --  1.13*  --   --   TROPONINI 1.80* 2.84* 3.31*  --   --  3.38*  --     Estimated Creatinine Clearance: 50.6 mL/min (A) (by C-G formula based on SCr of 1.13 mg/dL (H)).   Medical History: Past Medical History:  Diagnosis Date  . Arthritis   . Cardiomyopathy ([place])   . Cataract   . Cerebral hemorrhage ([place])   . CHF (congestive heart failure) ([place])   . Chronic kidney disease   . Clotting disorder ([place])   . COPD (chronic obstructive pulmonary disease) ([place])   . Depression   . Diabetes mellitus without complication ([place])   . Hypertension   . Mixed incontinence   . Obesity   . Sleep apnea   . Thyroid disease   . Urinary frequency   . Vaginal atrophy   . Varicose veins     Medications:  No anticoagulation in PTA meds.  Assessment: Trop 1.8  Goal of Therapy:  Heparin level 0.3-0.7 units/ml Monitor platelets by  anticoagulation protocol: Yes   Plan:  4000 unit bolus and initial rate of 950 units/hr. First heparin level 8 hours after start of infusion.  5/4 1539 HL 0.16- Level is subtherapeutic. Will order 2000 unit bolus and increase infusion to 1150u/hr. Recheck heparin level in 8 hours.   5/5 0100 heparin level 0.35. Continue current regimen. Recheck with AM labs to confirm.  5/5 AM heparin level 0.26. 1200 unit bolus and increase rate to 1300 units/hr. Recheck heparin level in 8 hours.  Eloise Harman, PharmD, BCPS Clinical Pharmacist 04/16/2018 4:09 AM

## 2018-04-16 NOTE — Progress Notes (Signed)
ANTICOAGULATION CONSULT NOTE -  Pharmacy Consult for heparin drip Indication: chest pain/ACS  Allergies  Allergen Reactions  . Aspirin Swelling  . Codeine Itching  . Naprosyn [Naproxen] Other (See Comments)    Per MAR pt allergic  . Other     Elastic Bandages/ Supports Per MAR  . Tape Hives  . Valacyclovir     Patient Measurements: Height: 5\' 5"  (165.1 cm) Weight: 217 lb 13 oz (98.8 kg) IBW/kg (Calculated) : 57 Heparin Dosing Weight: 79 kg  Vital Signs: Temp: 98.6 F (37 C) (05/05 0800) Temp Source: Oral (05/05 0800) BP: 132/98 (05/05 1430) Pulse Rate: 74 (05/05 1430)  Labs: Recent Labs    04/15/18 0506 04/15/18 0852 04/15/18 1416  04/15/18 1929 04/15/18 2032 04/16/18 0120 04/16/18 0517 04/16/18 1413  HGB 12.1  --   --   --  11.1*  --   --  11.2*  --   HCT 35.9  --   --   --  33.2*  --   --  33.0*  --   PLT 277  --   --   --  284  --   --  321  --   APTT 29  --   --   --   --   --   --   --   --   LABPROT 12.8  --   --   --   --   --   --   --   --   INR 0.97  --   --   --   --   --   --   --   --   HEPARINUNFRC  --   --   --    < >  --   --  0.35 0.26* 0.41  CREATININE 0.68  --   --   --  1.13*  --   --  0.79  --   TROPONINI 1.80* 2.84* 3.31*  --   --  3.38*  --   --   --    < > = values in this interval not displayed.    Estimated Creatinine Clearance: 71.8 mL/min (by C-G formula based on SCr of 0.79 mg/dL).   Medical History: Past Medical History:  Diagnosis Date  . Arthritis   . Cardiomyopathy ([place])   . Cataract   . Cerebral hemorrhage ([place])   . CHF (congestive heart failure) ([place])   . Chronic kidney disease   . Clotting disorder ([place])   . COPD (chronic obstructive pulmonary disease) ([place])   . Depression   . Diabetes mellitus without complication ([place])   . Hypertension   . Mixed incontinence   . Obesity   . Sleep apnea   . Thyroid disease   . Urinary frequency   . Vaginal atrophy   . Varicose veins     Medications:  No  anticoagulation in PTA meds.  Assessment: Trop 1.8  Goal of Therapy:  Heparin level 0.3-0.7 units/ml Monitor platelets by anticoagulation protocol: Yes   Plan:  4000 unit bolus and initial rate of 950 units/hr. First heparin level 8 hours after start of infusion.  5/4 1539 HL 0.16- Level is subtherapeutic. Will order 2000 unit bolus and increase infusion to 1150u/hr. Recheck heparin level in 8 hours.   5/5 0100 heparin level 0.35. Continue current regimen. Recheck with AM labs to confirm.  5/5 AM heparin level 0.26. 1200 unit bolus and increase rate to 1300 units/hr. Recheck heparin level in 8  hours.  5/5 1413 heparin level= 0.41. Will continue current rate and check confirmatory level in 8 hrs.   Noralee Space, PharmD, BCPS Clinical Pharmacist 04/16/2018 2:45 PM

## 2018-04-17 ENCOUNTER — Inpatient Hospital Stay: Payer: Medicare Other

## 2018-04-17 LAB — GLUCOSE, CAPILLARY
Glucose-Capillary: 136 mg/dL — ABNORMAL HIGH (ref 65–99)
Glucose-Capillary: 154 mg/dL — ABNORMAL HIGH (ref 65–99)
Glucose-Capillary: 171 mg/dL — ABNORMAL HIGH (ref 65–99)
Glucose-Capillary: 149 mg/dL — ABNORMAL HIGH (ref 65–99)
Glucose-Capillary: 190 mg/dL — ABNORMAL HIGH (ref 65–99)
Glucose-Capillary: 170 mg/dL — ABNORMAL HIGH (ref 65–99)

## 2018-04-17 LAB — CBC WITH DIFFERENTIAL/PLATELET
BASOS PCT: 0 %
Basophils Absolute: 0.1 10*3/uL (ref 0–0.1)
EOS ABS: 0.1 10*3/uL (ref 0–0.7)
Eosinophils Relative: 1 %
HCT: 30.9 % — ABNORMAL LOW (ref 35.0–47.0)
HEMOGLOBIN: 10.4 g/dL — AB (ref 12.0–16.0)
Lymphocytes Relative: 9 %
Lymphs Abs: 1.3 10*3/uL (ref 1.0–3.6)
MCH: 32.1 pg (ref 26.0–34.0)
MCHC: 33.6 g/dL (ref 32.0–36.0)
MCV: 95.6 fL (ref 80.0–100.0)
MONOS PCT: 5 %
Monocytes Absolute: 0.7 10*3/uL (ref 0.2–0.9)
NEUTROS PCT: 85 %
Neutro Abs: 12.1 10*3/uL — ABNORMAL HIGH (ref 1.4–6.5)
Platelets: 249 10*3/uL (ref 150–440)
RBC: 3.23 MIL/uL — ABNORMAL LOW (ref 3.80–5.20)
RDW: 16.4 % — ABNORMAL HIGH (ref 11.5–14.5)
WBC: 14.3 10*3/uL — ABNORMAL HIGH (ref 3.6–11.0)

## 2018-04-17 LAB — URINE CULTURE: Culture: >=100000 — AB

## 2018-04-17 LAB — BASIC METABOLIC PANEL
Anion gap: 7 (ref 5–15)
BUN: 19 mg/dL (ref 6–20)
CALCIUM: 8.5 mg/dL — AB (ref 8.9–10.3)
CO2: 26 mmol/L (ref 22–32)
CREATININE: 0.78 mg/dL (ref 0.44–1.00)
Chloride: 104 mmol/L (ref 101–111)
GFR calc non Af Amer: >60 mL/min (ref >60)
Glucose, Bld: 149 mg/dL — ABNORMAL HIGH (ref 65–99)
Potassium: 4.2 mmol/L (ref 3.5–5.1)
SODIUM: 137 mmol/L (ref 135–145)

## 2018-04-17 LAB — MAGNESIUM: MAGNESIUM: 1.8 mg/dL (ref 1.7–2.4)

## 2018-04-17 LAB — CALCIUM, IONIZED: Calcium, Ionized, Serum: 4.8 mg/dL (ref 4.5–5.6)

## 2018-04-17 LAB — HEPARIN LEVEL (UNFRACTIONATED): Heparin Unfractionated: 0.38 IU/mL (ref 0.30–0.70)

## 2018-04-17 LAB — PHOSPHORUS: PHOSPHORUS: 3.3 mg/dL (ref 2.5–4.6)

## 2018-04-17 MED ORDER — INSULIN ASPART 100 UNIT/ML ~~LOC~~ SOLN: 0.0000 [IU] | Freq: Every day | SUBCUTANEOUS | Status: DC

## 2018-04-17 MED ORDER — IOPAMIDOL (ISOVUE-370) INJECTION 76%
75.0000 mL | Freq: Once | INTRAVENOUS | Status: AC | PRN
  Administered 2018-04-17: 75 mL via INTRAVENOUS

## 2018-04-17 MED ORDER — VASOPRESSIN 20 UNIT/ML IV SOLN
0.0300 [IU]/min | INTRAVENOUS | Status: DC
  Administered 2018-04-17 – 2018-04-18 (×2): 0.03 [IU]/min via INTRAVENOUS
  Filled 2018-04-17 (×3): qty 2

## 2018-04-17 MED ORDER — PHENYLEPHRINE HCL 10 MG/ML IJ SOLN
0.0000 ug/min | INTRAMUSCULAR | Status: DC
  Administered 2018-04-17: 100 ug/min via INTRAVENOUS
  Administered 2018-04-18: 20 ug/min via INTRAVENOUS
  Filled 2018-04-17: qty 10
  Filled 2018-04-17 (×2): qty 1

## 2018-04-17 MED ORDER — SODIUM CHLORIDE 0.9 % IV SOLN
2.0000 g | Freq: Two times a day (BID) | INTRAVENOUS | Status: DC
  Administered 2018-04-17 – 2018-04-22 (×10): 2 g via INTRAVENOUS
  Filled 2018-04-17 (×11): qty 2

## 2018-04-17 MED ORDER — VANCOMYCIN HCL IN DEXTROSE 1-5 GM/200ML-% IV SOLN
1000.0000 mg | Freq: Once | INTRAVENOUS | Status: DC
  Filled 2018-04-17: qty 200

## 2018-04-17 MED ORDER — CEFAZOLIN SODIUM-DEXTROSE 1-4 GM/50ML-% IV SOLN
1.0000 g | Freq: Three times a day (TID) | INTRAVENOUS | Status: DC
  Filled 2018-04-17 (×2): qty 50

## 2018-04-17 MED ORDER — HYDROCORTISONE NA SUCCINATE PF 100 MG IJ SOLR
100.0000 mg | Freq: Three times a day (TID) | INTRAMUSCULAR | Status: DC
  Administered 2018-04-17 – 2018-04-21 (×13): 100 mg via INTRAVENOUS
  Filled 2018-04-17 (×14): qty 2

## 2018-04-17 MED ORDER — GUAIFENESIN 100 MG/5ML PO SOLN
5.0000 mL | ORAL | Status: DC | PRN
  Administered 2018-04-18 – 2018-04-21 (×4): 100 mg via ORAL
  Filled 2018-04-17 (×7): qty 5

## 2018-04-17 MED ORDER — HYDROCORTISONE NA SUCCINATE PF 250 MG IJ SOLR
1000.0000 mg | Freq: Three times a day (TID) | INTRAMUSCULAR | Status: DC
  Filled 2018-04-17: qty 1000

## 2018-04-17 MED ORDER — SODIUM CHLORIDE 0.9 % IV SOLN
1500.0000 mg | Freq: Once | INTRAVENOUS | Status: AC
  Administered 2018-04-17: 1500 mg via INTRAVENOUS
  Filled 2018-04-17: qty 1500

## 2018-04-17 MED ORDER — INSULIN ASPART 100 UNIT/ML ~~LOC~~ SOLN
0.0000 [IU] | Freq: Three times a day (TID) | SUBCUTANEOUS | Status: DC
  Administered 2018-04-17 – 2018-04-19 (×6): 3 [IU] via SUBCUTANEOUS
  Administered 2018-04-19 – 2018-04-22 (×4): 2 [IU] via SUBCUTANEOUS
  Filled 2018-04-17 (×9): qty 1

## 2018-04-17 MED ORDER — HYDROCORTISONE NA SUCCINATE PF 1000 MG IJ SOLR
1000.0000 mg | Freq: Three times a day (TID) | INTRAMUSCULAR | Status: DC
  Filled 2018-04-17 (×2): qty 1000

## 2018-04-17 MED ORDER — MAGNESIUM SULFATE IN D5W 1-5 GM/100ML-% IV SOLN
1.0000 g | Freq: Once | INTRAVENOUS | Status: AC
  Administered 2018-04-17: 1 g via INTRAVENOUS
  Filled 2018-04-17: qty 100

## 2018-04-17 MED ORDER — CEFAZOLIN SODIUM-DEXTROSE 1-4 GM/50ML-% IV SOLN
1.0000 g | Freq: Two times a day (BID) | INTRAVENOUS | Status: DC
  Administered 2018-04-17: 1 g via INTRAVENOUS
  Filled 2018-04-17 (×3): qty 50

## 2018-04-17 MED ORDER — VANCOMYCIN HCL IN DEXTROSE 1-5 GM/200ML-% IV SOLN
1000.0000 mg | Freq: Two times a day (BID) | INTRAVENOUS | Status: DC
  Administered 2018-04-18 – 2018-04-19 (×4): 1000 mg via INTRAVENOUS
  Filled 2018-04-17 (×6): qty 200

## 2018-04-17 MED ORDER — IPRATROPIUM-ALBUTEROL 0.5-2.5 (3) MG/3ML IN SOLN
3.0000 mL | RESPIRATORY_TRACT | Status: DC
  Administered 2018-04-17 – 2018-04-18 (×9): 3 mL via RESPIRATORY_TRACT
  Filled 2018-04-17 (×9): qty 3

## 2018-04-17 NOTE — Clinical Social Work Note (Signed)
CSW met with patient to go over SNF bed offers. [facility] are able to accept patient. Patient prefers [facility] but states that she is fine with going to [facility]. Per Tammy at [facility] could come to [facility] but she would have to pay $1193.50 for 7 copay days up front. Tammy explained that patient has been to [facility] in the past and patient had not applied for Medicaid at that time.    Annamaria Boots MSW, [employer]

## 2018-04-17 NOTE — Progress Notes (Signed)
Patient Name: Sara Pierce Date of Encounter: 04/17/2018  Hospital Problem List     Active Problems:   NSTEMI (non-ST elevated myocardial infarction) [facility])    Patient Profile     No chest pain.  Wheezing.  Shortness of breath.  Continues to have bilateral leg pain  Subjective   Bilateral leg pain and shortness of breath with wheezing.  Inpatient Medications    . ARIPiprazole  5 mg Oral QHS  . atorvastatin  40 mg Oral q1800  . budesonide  0.5 mg Nebulization BID  . buPROPion  150 mg Oral Daily  . chlorhexidine  15 mL Mouth Rinse BID  . clopidogrel  75 mg Oral Daily  . guaiFENesin  600 mg Oral BID   And  . dextromethorphan  30 mg Oral BID  . docusate sodium  100 mg Oral BID  . FLUoxetine  80 mg Oral Daily  . gabapentin  400 mg Oral TID  . insulin aspart  0-5 Units Subcutaneous QHS  . insulin aspart  0-9 Units Subcutaneous TID WC  . linagliptin  5 mg Oral Daily  . liothyronine  25 mcg Oral Daily  . mirtazapine  7.5 mg Oral QHS  . montelukast  10 mg Oral QHS  . pantoprazole  40 mg Oral Daily  . sodium chloride flush  3 mL Intravenous Q12H  . sodium chloride flush  3 mL Intravenous Q12H  . umeclidinium-vilanterol  1 puff Inhalation Daily  . [START ON 04/23/2018] Vitamin D (Ergocalciferol)  50,000 Units Oral Q30 days    Vital Signs    Vitals:   04/17/18 0530 04/17/18 0545 04/17/18 0600 04/17/18 0615  BP: 106/73 (!) 118/52 (!) 120/49 111/64  Pulse: 82 81 82 93  Resp: 14 16 13 17   Temp:      TempSrc:      SpO2: 94% 95% 95% 90%  Weight:      Height:        Intake/Output Summary (Last 24 hours) at 04/17/2018 0811 Last data filed at 04/17/2018 0600 Gross per 24 hour  Intake 3148.52 ml  Output 1800 ml  Net 1348.52 ml   Filed Weights   04/15/18 1723 04/16/18 0420 04/17/18 0442  Weight: 97.9 kg (215 lb 13.3 oz) 98.8 kg (217 lb 13 oz) 102.9 kg (226 lb 13.7 oz)    Physical Exam    GEN: Well nourished, well developed, in no acute distress.  HEENT: normal.   Neck: Supple, no JVD, carotid bruits, or masses. Cardiac: RRR, no murmurs, rubs, or gallops. No clubbing, cyanosis, edema.  Radials/DP/PT 2+ and equal bilaterally.  Respiratory:  Respirations regular and unlabored, clear to auscultation bilaterally. GI: Soft, nontender, nondistended, BS + x 4. MS: no deformity or atrophy. Skin: warm and dry, no rash. Neuro:  Strength and sensation are intact. Psych: Normal affect.  Labs    CBC Recent Labs    04/15/18 1929 04/16/18 0517 04/17/18 0438  WBC 11.5* 14.7* 14.3*  NEUTROABS 9.0*  --  12.1*  HGB 11.1* 11.2* 10.4*  HCT 33.2* 33.0* 30.9*  MCV 94.9 95.6 95.6  PLT 284 321 956   Basic Metabolic Panel Recent Labs    04/16/18 0517 04/17/18 0438  NA 139 137  K 3.9 4.2  CL 104 104  CO2 26 26  GLUCOSE 144* 149*  BUN 24* 19  CREATININE 0.79 0.78  CALCIUM 8.9 8.5*  MG 1.9 1.8  PHOS 3.9 3.3   Liver Function Tests No results for input(s): AST, ALT,  ALKPHOS, BILITOT, PROT, ALBUMIN in the last 72 hours. No results for input(s): LIPASE, AMYLASE in the last 72 hours. Cardiac Enzymes Recent Labs    04/15/18 0852 04/15/18 1416 04/15/18 2032  TROPONINI 2.84* 3.31* 3.38*   BNP No results for input(s): BNP in the last 72 hours. D-Dimer No results for input(s): DDIMER in the last 72 hours. Hemoglobin A1C No results for input(s): HGBA1C in the last 72 hours. Fasting Lipid Panel Recent Labs    04/16/18 0517  CHOL 181  HDL 56  LDLCALC 105*  TRIG 98  CHOLHDL 3.2   Thyroid Function Tests Recent Labs    04/15/18 0852  TSH 1.357    Telemetry    Normal sinus rhythm with right bundle branch block  ECG    Sinus rhythm with right bundle branch block  Radiology    Dg Chest 2 View  Result Date: 03/23/2018 CLINICAL DATA:  Congestion, productive cough, pneumonia, shortness of breath, chest pain, history CHF, COPD, cardiomyopathy EXAM: CHEST - 2 VIEW COMPARISON:  01/05/2018 FINDINGS: Slightly rotated to the LEFT. Borderline  enlargement of cardiac silhouette. Atherosclerotic calcification aorta. Enlarged central pulmonary arteries question pulmonary arterial hypertension. Bibasilar and RIGHT mid lung atelectasis. No definite acute infiltrate, pleural effusion or pneumothorax. Abnormal RIGHT paramediastinal density superior to the RIGHT hilum at the level of the aortic arch is likely related to the marked compression fracture of the T6 vertebral body with paraspinal calcific debris and spurring as noted on prior CT exams, accentuated by rotation to the LEFT. Bones demineralized with note of a RIGHT shoulder prosthesis. IMPRESSION: Bibasilar atelectasis. No new abnormalities. Electronically Signed   By: Lavonia Dana M.D.   On: 03/23/2018 12:37   Ct Chest Wo Contrast  Result Date: 03/23/2018 CLINICAL DATA:  COPD exacerbation.  Dyspnea.  Productive cough. EXAM: CT CHEST WITHOUT CONTRAST TECHNIQUE: Multidetector CT imaging of the chest was performed following the standard protocol without IV contrast. COMPARISON:  Chest radiograph from earlier today. 11/26/2017 chest CT. FINDINGS: Cardiovascular: Normal heart size. No significant pericardial fluid/thickening. Left main and 3 vessel coronary atherosclerosis. Atherosclerotic nonaneurysmal thoracic aorta. Stable prominently dilated main pulmonary artery (4.2 cm diameter). Mediastinum/Nodes: Hypodense 1.0 cm left thyroid lobe nodule. Unremarkable esophagus. No pathologically enlarged axillary, mediastinal or gross hilar lymph nodes, noting limited sensitivity for the detection of hilar adenopathy on this noncontrast study. Lungs/Pleura: No pneumothorax. No pleural effusion. Chronic bandlike opacities in the dependent right upper lobe and dependent/medial bilateral lower lobes with associated volume loss, compatible with chronic postinfectious/postinflammatory scarring. No acute consolidative airspace disease or lung masses. Sub solid 4 mm right middle lobe pulmonary nodule (series 3/image 80)  is stable since 10/24/2017 chest CT. No new significant pulmonary nodules. Upper abdomen: Small hiatal hernia. Stable thickening of the left greater than right adrenal glands without discrete adrenal nodules. Musculoskeletal: No aggressive appearing focal osseous lesions. Stable chronic severe T6 vertebral compression fracture. Marked thoracic spondylosis. Partially visualized right shoulder arthroplasty. Stable subcutaneous upper right back 1.8 cm sebaceous cyst. IMPRESSION: 1. Stable chronic postinfectious/postinflammatory scarring in the dependent right upper lobe and dependent bilateral lower lobes. No superimposed acute consolidative airspace disease. 2. Sub solid 4 mm right middle lobe pulmonary nodule, stable since 10/24/2017 chest CT, probably benign. No follow-up recommended. This recommendation follows the consensus statement: Guidelines for Management of Incidental Pulmonary Nodules Detected on CT Images: From the Fleischner Society 2017; Radiology 2017; 284:228-243. 3. Stable prominently dilated main pulmonary artery, suggesting chronic pulmonary arterial hypertension. 4. Left main and 3  vessel coronary atherosclerosis. 5. Small hiatal hernia. Aortic Atherosclerosis (ICD10-I70.0). Electronically Signed   By: Ilona Sorrel M.D.   On: 03/23/2018 15:36   Dg Chest Port 1 View  Result Date: 04/17/2018 CLINICAL DATA:  Follow-up pneumonia EXAM: PORTABLE CHEST 1 VIEW COMPARISON:  04/15/2018 FINDINGS: Cardiac shadow is stable. Left jugular central line is again seen. Lungs are well aerated bilaterally. Persistent changes in the left base are seen. No new focal infiltrate is noted. Postsurgical changes in the right shoulder are seen. IMPRESSION: Stable left basilar changes.  No acute abnormality noted. Electronically Signed   By: Inez Catalina M.D.   On: 04/17/2018 07:17   Dg Chest Port 1 View  Result Date: 04/15/2018 CLINICAL DATA:  Central line placement. EXAM: PORTABLE CHEST 1 VIEW COMPARISON:  Radiograph  of Apr 15, 2018. FINDINGS: Stable cardiomediastinal silhouette. Atherosclerosis of thoracic aorta is noted. Interval placement of left internal jugular catheter with distal tip in expected position of the SVC. No pneumothorax is noted. Right lung is clear. Mild left basilar subsegmental atelectasis or scarring is noted with possible minimal left pleural effusion. Bony thorax is unremarkable. IMPRESSION: Interval placement of left internal jugular catheter with distal tip in expected position of the SVC. No pneumothorax is noted. Mild left basilar subsegmental atelectasis or scarring is noted with possible minimal left pleural effusion. Electronically Signed   By: Marijo Conception, M.D.   On: 04/15/2018 20:23   Dg Chest Port 1 View  Result Date: 04/15/2018 CLINICAL DATA:  77 year old female with cough. EXAM: PORTABLE CHEST 1 VIEW COMPARISON:  Chest CT dated 03/23/2018 FINDINGS: There is no focal consolidation, pleural effusion, or pneumothorax. The cardiac silhouette is within normal limits. Atherosclerotic calcification of the aorta. Old healed left humeral neck fracture and right shoulder hemiarthroplasty. No acute osseous pathology. IMPRESSION: No active disease. Electronically Signed   By: Anner Crete M.D.   On: 04/15/2018 06:00    Assessment & Plan    77 year old female admitted with bilateral lower extremity pain and cramping.  Mild troponin elevation.  Still somewhat hypotensive requiring levo fed.  Echo showed preserved LV function although images are somewhat difficult to assess.  Right side was mildly elevated.  Troponin 3.5.  Wheezing today.  Chest x-ray showed no pulmonary edema.  We will proceed with chest CT to rule out pulmonary embolus.  We will also aggressively treat with bronchodilators.  Will defer left heart cath today and consider later this week when hemodynamically stable and off Levophed.  Does not appear to be actively ischemic.  Signed, Javier Docker Cheila Wickstrom MD 04/17/2018, 8:11 AM   Pager: [phone]

## 2018-04-17 NOTE — Progress Notes (Signed)
Pharmacy Electrolyte Monitoring Consult:  Pharmacy consulted to assist in monitoring and replacing electrolytes in this 77 y.o. female admitted on 04/15/2018 with NSTEMI.   Labs:  Sodium (mmol/L)  Date Value  04/17/2018 137  01/14/2015 137   Potassium (mmol/L)  Date Value  04/17/2018 4.2  01/14/2015 4.4   Magnesium (mg/dL)  Date Value  04/17/2018 1.8  05/05/2014 2.3   Phosphorus (mg/dL)  Date Value  04/17/2018 3.3   Calcium (mg/dL)  Date Value  04/17/2018 8.5 (L)   Calcium, Total (mg/dL)  Date Value  01/14/2015 9.1   Albumin (g/dL)  Date Value  03/23/2018 4.0  10/05/2014 3.4    Plan:  Will order magnesium 1g IV x 1 for goal magnesium ~2.   Will obtain electrolyte with am labs.   Pharmacy will continue to monitor and adjust per consult.   Devone Bonilla L 04/17/2018 5:33 PM

## 2018-04-17 NOTE — Progress Notes (Signed)
Pt started to become diaphoretic and shaky at around 1500: PT BG was 149, temp of 98.5 axillary and 97.5 oral. Pt's breathing appeared more labored with audible wheezes. MD notified and came to bedside. CVP was taken. PT CVP was 20, NS stopped. Bipap ordered as needed. Pt started to have a prolonged coughing fit at 1630: HR increased to the 140's, BP elevated as well. PT was unable to wear the Bipap due to her coughing. As of right now, pt is resting calmly on 3L [place]. Wheezing has decreased, pt states that she feels a little better. Will continue to monitor.

## 2018-04-17 NOTE — Progress Notes (Signed)
Patient slept most of the night. Had medication for leg spasms at HS. NPO for cardiac catherization this AM.

## 2018-04-17 NOTE — Care Management (Addendum)
RNCM reached out to [facility] as they are following patient for [place], [place], and [facility]. Per [facility], patient's needs are beyond home care unless she has tremendous improvement at hospital. Her husband is also frequently drinking alcohol before visits as well. CSW following. Note update at 1044AM: private duty agency [facility] had to drop her as a patient a couple of weeks before her last hospitalization due to problems in the home and with the husband. Patient has an active case with [facility].

## 2018-04-17 NOTE — Progress Notes (Signed)
Follow up - Critical Care Medicine Note  Patient Details:    Sara Pierce is an 77 y.o. female.with a past medical history remarkable for arthritis, cardiomyopathy, chronic renal insufficiency, COPD, depression, diabetes, hypertension, obesity, sleep apnea admitted for an STEMI, hypotension. Echocardiogram reveals EF of 55-65%.  Lines, Airways, Drains: CVC Triple Lumen 04/15/18 Left Internal jugular (Active)  Indication for Insertion or Continuance of Line Vasoactive infusions 04/17/2018  8:00 AM  Site Assessment Clean;Dry;Intact 04/16/2018  8:00 PM  Proximal Lumen Status Infusing 04/16/2018  8:00 PM  Medial Lumen Status Infusing 04/16/2018  8:00 PM  Distal Lumen Status Infusing 04/16/2018  8:00 PM  Dressing Type Transparent;Occlusive 04/16/2018  8:00 PM  Dressing Status Clean;Dry;Intact;Antimicrobial disc in place 04/16/2018  8:00 PM  Line Care Connections checked and tightened 04/16/2018  8:00 PM  Dressing Change Due 04/22/18 04/16/2018  8:00 PM     External Urinary Catheter (Active)  Collection Container Dedicated Suction Canister 04/17/2018  4:00 AM  Securement Method Other (Comment) 04/15/2018  5:33 PM  Intervention Equipment Changed 04/17/2018  5:00 AM  Output (mL) 300 mL 04/17/2018  5:00 AM    Anti-infectives:  Anti-infectives (From admission, onward)   Start     Dose/Rate Route Frequency Ordered Stop   04/16/18 1545  piperacillin-tazobactam (ZOSYN) IVPB 3.375 g     3.375 g 12.5 mL/hr over 240 Minutes Intravenous Every 8 hours 04/16/18 1539     04/15/18 1900  cefTRIAXone (ROCEPHIN) 1 g in sodium chloride 0.9 % 100 mL IVPB  Status:  Discontinued     1 g 200 mL/hr over 30 Minutes Intravenous Every 24 hours 04/15/18 1838 04/16/18 1539   04/15/18 1100  cefTRIAXone (ROCEPHIN) 1 g in sodium chloride 0.9 % 100 mL IVPB  Status:  Discontinued     1 g 200 mL/hr over 30 Minutes Intravenous  Once 04/15/18 1013 04/15/18 1046   04/15/18 0615  cefTRIAXone (ROCEPHIN) 1 g in sodium chloride 0.9 % 100 mL IVPB      1 g 200 mL/hr over 30 Minutes Intravenous  Once 04/15/18 0602 04/15/18 5643      Microbiology: Results for orders placed or performed during the hospital encounter of 04/15/18  Urine culture     Status: Abnormal   Collection Time: 04/15/18  5:06 AM  Result Value Ref Range Status   Specimen Description   Final    URINE, CATHETERIZED Performed at [facility], [address] [place], [place] [address]    Special Requests   Final    NONE Performed at [facility], [address] [place], [place] [address]    Culture >=100,000 COLONIES/mL PROTEUS MIRABILIS (A)  Final   Report Status 04/17/2018 FINAL  Final   Organism ID, Bacteria PROTEUS MIRABILIS (A)  Final      Susceptibility   Proteus mirabilis - MIC*    AMPICILLIN <=2 SENSITIVE Sensitive     CEFAZOLIN <=4 SENSITIVE Sensitive     CEFTRIAXONE <=1 SENSITIVE Sensitive     CIPROFLOXACIN >=4 RESISTANT Resistant     GENTAMICIN <=1 SENSITIVE Sensitive     IMIPENEM 1 SENSITIVE Sensitive     NITROFURANTOIN 128 RESISTANT Resistant     TRIMETH/SULFA >=320 RESISTANT Resistant     AMPICILLIN/SULBACTAM <=2 SENSITIVE Sensitive     PIP/TAZO <=4 SENSITIVE Sensitive     * >=100,000 COLONIES/mL PROTEUS MIRABILIS  MRSA PCR Screening     Status: None   Collection Time: 04/15/18  3:25 PM  Result Value Ref Range Status  MRSA by PCR NEGATIVE NEGATIVE Final    Comment:        The GeneXpert MRSA Assay (FDA approved for NASAL specimens only), is one component of a comprehensive MRSA colonization surveillance program. It is not intended to diagnose MRSA infection nor to guide or monitor treatment for MRSA infections. Performed at [facility], [address] [place], [place] [address]     Studies: Dg Chest 2 View  Result Date: 03/23/2018 CLINICAL DATA:  Congestion, productive cough, pneumonia, shortness of breath, chest pain, history CHF, COPD, cardiomyopathy EXAM: CHEST - 2 VIEW COMPARISON:  01/05/2018  FINDINGS: Slightly rotated to the LEFT. Borderline enlargement of cardiac silhouette. Atherosclerotic calcification aorta. Enlarged central pulmonary arteries question pulmonary arterial hypertension. Bibasilar and RIGHT mid lung atelectasis. No definite acute infiltrate, pleural effusion or pneumothorax. Abnormal RIGHT paramediastinal density superior to the RIGHT hilum at the level of the aortic arch is likely related to the marked compression fracture of the T6 vertebral body with paraspinal calcific debris and spurring as noted on prior CT exams, accentuated by rotation to the LEFT. Bones demineralized with note of a RIGHT shoulder prosthesis. IMPRESSION: Bibasilar atelectasis. No new abnormalities. Electronically Signed   By: Lavonia Dana M.D.   On: 03/23/2018 12:37   Ct Chest Wo Contrast  Result Date: 03/23/2018 CLINICAL DATA:  COPD exacerbation.  Dyspnea.  Productive cough. EXAM: CT CHEST WITHOUT CONTRAST TECHNIQUE: Multidetector CT imaging of the chest was performed following the standard protocol without IV contrast. COMPARISON:  Chest radiograph from earlier today. 11/26/2017 chest CT. FINDINGS: Cardiovascular: Normal heart size. No significant pericardial fluid/thickening. Left main and 3 vessel coronary atherosclerosis. Atherosclerotic nonaneurysmal thoracic aorta. Stable prominently dilated main pulmonary artery (4.2 cm diameter). Mediastinum/Nodes: Hypodense 1.0 cm left thyroid lobe nodule. Unremarkable esophagus. No pathologically enlarged axillary, mediastinal or gross hilar lymph nodes, noting limited sensitivity for the detection of hilar adenopathy on this noncontrast study. Lungs/Pleura: No pneumothorax. No pleural effusion. Chronic bandlike opacities in the dependent right upper lobe and dependent/medial bilateral lower lobes with associated volume loss, compatible with chronic postinfectious/postinflammatory scarring. No acute consolidative airspace disease or lung masses. Sub solid 4 mm  right middle lobe pulmonary nodule (series 3/image 80) is stable since 10/24/2017 chest CT. No new significant pulmonary nodules. Upper abdomen: Small hiatal hernia. Stable thickening of the left greater than right adrenal glands without discrete adrenal nodules. Musculoskeletal: No aggressive appearing focal osseous lesions. Stable chronic severe T6 vertebral compression fracture. Marked thoracic spondylosis. Partially visualized right shoulder arthroplasty. Stable subcutaneous upper right back 1.8 cm sebaceous cyst. IMPRESSION: 1. Stable chronic postinfectious/postinflammatory scarring in the dependent right upper lobe and dependent bilateral lower lobes. No superimposed acute consolidative airspace disease. 2. Sub solid 4 mm right middle lobe pulmonary nodule, stable since 10/24/2017 chest CT, probably benign. No follow-up recommended. This recommendation follows the consensus statement: Guidelines for Management of Incidental Pulmonary Nodules Detected on CT Images: From the Fleischner Society 2017; Radiology 2017; 284:228-243. 3. Stable prominently dilated main pulmonary artery, suggesting chronic pulmonary arterial hypertension. 4. Left main and 3 vessel coronary atherosclerosis. 5. Small hiatal hernia. Aortic Atherosclerosis (ICD10-I70.0). Electronically Signed   By: Ilona Sorrel M.D.   On: 03/23/2018 15:36   Dg Chest Port 1 View  Result Date: 04/17/2018 CLINICAL DATA:  Follow-up pneumonia EXAM: PORTABLE CHEST 1 VIEW COMPARISON:  04/15/2018 FINDINGS: Cardiac shadow is stable. Left jugular central line is again seen. Lungs are well aerated bilaterally. Persistent changes in the left base are seen. No new  focal infiltrate is noted. Postsurgical changes in the right shoulder are seen. IMPRESSION: Stable left basilar changes.  No acute abnormality noted. Electronically Signed   By: Inez Catalina M.D.   On: 04/17/2018 07:17   Dg Chest Port 1 View  Result Date: 04/15/2018 CLINICAL DATA:  Central line  placement. EXAM: PORTABLE CHEST 1 VIEW COMPARISON:  Radiograph of Apr 15, 2018. FINDINGS: Stable cardiomediastinal silhouette. Atherosclerosis of thoracic aorta is noted. Interval placement of left internal jugular catheter with distal tip in expected position of the SVC. No pneumothorax is noted. Right lung is clear. Mild left basilar subsegmental atelectasis or scarring is noted with possible minimal left pleural effusion. Bony thorax is unremarkable. IMPRESSION: Interval placement of left internal jugular catheter with distal tip in expected position of the SVC. No pneumothorax is noted. Mild left basilar subsegmental atelectasis or scarring is noted with possible minimal left pleural effusion. Electronically Signed   By: Marijo Conception, M.D.   On: 04/15/2018 20:23   Dg Chest Port 1 View  Result Date: 04/15/2018 CLINICAL DATA:  77 year old female with cough. EXAM: PORTABLE CHEST 1 VIEW COMPARISON:  Chest CT dated 03/23/2018 FINDINGS: There is no focal consolidation, pleural effusion, or pneumothorax. The cardiac silhouette is within normal limits. Atherosclerotic calcification of the aorta. Old healed left humeral neck fracture and right shoulder hemiarthroplasty. No acute osseous pathology. IMPRESSION: No active disease. Electronically Signed   By: Anner Crete M.D.   On: 04/15/2018 06:00    Consults: Treatment Team:  Nicholes Mango, MD Teodoro Spray, MD Cassandria Santee, MD   Subjective:    Overnight Issues: patient complaining of leg cramping and discomfort.   Objective:  Vital signs for last 24 hours: Temp:  [98.1 F (36.7 C)-99.1 F (37.3 C)] 99.1 F (37.3 C) (05/06 0400) Pulse Rate:  [66-93] 93 (05/06 0615) Resp:  [12-25] 17 (05/06 0615) BP: (54-152)/(29-120) 111/64 (05/06 0615) SpO2:  [86 %-100 %] 90 % (05/06 0615) FiO2 (%):  [32 %] 32 % (05/05 1934) Weight:  [226 lb 13.7 oz (102.9 kg)] 226 lb 13.7 oz (102.9 kg) (05/06 0442)  Hemodynamic parameters for last 24 hours:     Intake/Output from previous day: 05/05 0701 - 05/06 0700 In: 3305.1 [P.O.:200; I.V.:3005.1; IV Piggyback:100] Out: 1800 [Urine:1800]  Intake/Output this shift: No intake/output data recorded.  Vent settings for last 24 hours: FiO2 (%):  [32 %] 32 %  Physical Exam:   Vital signs: Please see the above listed vital signs HEENT: A shin has a left internal jugular central line, trachea is midline, no jugular venous distention is noted, no oral lesions appreciated, minimal increased work of breathing Cardiovascular: Regular rate and rhythm Abdominal: Positive bowel sounds, soft exam Extremities: No lower extremity edema appreciated   Assessment/Plan:   Patient with NSTEMI. Mildly elevated troponin, requiring levo fed. Patient with preserved LV function. Troponin was 3.5. Catheterization been deferred by cardiology to later this week. Pending CT scan of the chest to rule out thromboembolic disease  Leukocytosis.  Patient is presently on Zosyn. Urinalysis shows Proteus  Anemia. No evidence of active bleeding  Critical Care Total Time. Critical care time 40 minutes  Allante Beane 04/17/2018  *Care during the described time interval was provided by me and/or other providers on the critical care team.  I have reviewed this patient's available data, including medical history, events of note, physical examination and test results as part of my evaluation.
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
## 2018-04-17 NOTE — Progress Notes (Signed)
[facility] for heparin drip management  Indication: NSTEMI  Allergies  Allergen Reactions  . Aspirin Swelling  . Codeine Itching  . Naprosyn [Naproxen] Other (See Comments)    Per MAR pt allergic  . Other     Elastic Bandages/ Supports Per MAR  . Tape Hives  . Valacyclovir     Patient Measurements: Height: 5\' 5"  (165.1 cm) Weight: 226 lb 13.7 oz (102.9 kg) IBW/kg (Calculated) : 57 Heparin Dosing Weight: 79 kg   Vital Signs: Temp: 98.5 F (36.9 C) (05/06 1700) Temp Source: Oral (05/06 1700) BP: 186/163 (05/06 1700) Pulse Rate: 114 (05/06 1700)  Labs: Recent Labs    04/15/18 0506 04/15/18 0852 04/15/18 1416  04/15/18 1929 04/15/18 2032  04/16/18 0517 04/16/18 1413 04/16/18 2147 04/17/18 0438  HGB 12.1  --   --   --  11.1*  --   --  11.2*  --   --  10.4*  HCT 35.9  --   --   --  33.2*  --   --  33.0*  --   --  30.9*  PLT 277  --   --   --  284  --   --  321  --   --  249  APTT 29  --   --   --   --   --   --   --   --   --   --   LABPROT 12.8  --   --   --   --   --   --   --   --   --   --   INR 0.97  --   --   --   --   --   --   --   --   --   --   HEPARINUNFRC  --   --   --    < >  --   --    < > 0.26* 0.41 0.36 0.38  CREATININE 0.68  --   --   --  1.13*  --   --  0.79  --   --  0.78  TROPONINI 1.80* 2.84* 3.31*  --   --  3.38*  --   --   --   --   --    < > = values in this interval not displayed.    Estimated Creatinine Clearance: 73.4 mL/min (by C-G formula based on SCr of 0.78 mg/dL).   Medical History: Past Medical History:  Diagnosis Date  . Arthritis   . Cardiomyopathy ([place])   . Cataract   . Cerebral hemorrhage ([place])   . CHF (congestive heart failure) ([place])   . Chronic kidney disease   . Clotting disorder ([place])   . COPD (chronic obstructive pulmonary disease) ([place])   . Depression   . Diabetes mellitus without complication ([place])   . Hypertension   . Mixed incontinence   . Obesity   . Sleep apnea   .  Thyroid disease   . Urinary frequency   . Vaginal atrophy   . Varicose veins     Medications:  Scheduled:  . ARIPiprazole  5 mg Oral QHS  . atorvastatin  40 mg Oral q1800  . budesonide  0.5 mg Nebulization BID  . buPROPion  150 mg Oral Daily  . chlorhexidine  15 mL Mouth Rinse BID  . clopidogrel  75 mg Oral Daily  . guaiFENesin  600 mg  Oral BID   And  . dextromethorphan  30 mg Oral BID  . docusate sodium  100 mg Oral BID  . FLUoxetine  80 mg Oral Daily  . gabapentin  400 mg Oral TID  . hydrocortisone sod succinate (SOLU-CORTEF) inj  100 mg Intravenous Q8H  . insulin aspart  0-15 Units Subcutaneous TID WC  . insulin aspart  0-5 Units Subcutaneous QHS  . ipratropium-albuterol  3 mL Nebulization Q4H  . linagliptin  5 mg Oral Daily  . liothyronine  25 mcg Oral Daily  . mirtazapine  7.5 mg Oral QHS  . pantoprazole  40 mg Oral Daily  . sodium chloride flush  3 mL Intravenous Q12H  . sodium chloride flush  3 mL Intravenous Q12H  . [START ON 04/23/2018] Vitamin D (Ergocalciferol)  50,000 Units Oral Q30 days   Infusions:  . sodium chloride 75 mL/hr at 04/17/18 1400  .  ceFAZolin (ANCEF) IV Stopped (04/17/18 1340)  . heparin 1,300 Units/hr (04/17/18 1400)  . norepinephrine (LEVOPHED) Adult infusion 15 mcg/min (04/17/18 1732)  . phenylephrine (NEO-SYNEPHRINE) Adult infusion Stopped (04/17/18 1726)  . sodium chloride    . vasopressin (PITRESSIN) infusion - *FOR SHOCK* 0.03 Units/min (04/17/18 1400)    Assessment: Pharmacy consulted for heparin drip management for 77 yo female admitted with NSTEMI. Patient for CT on 5/6 with no PE identified. Patient currently receiving heparin at 1300 units/hr.   Goal of Therapy:  Heparin level 0.3-0.7 units/ml Monitor platelets by anticoagulation protocol: Yes   Plan:  Anti-Xa level currently in range, will continue patient on heparin 1300 units/hr. Will obtain follow up anti-Xa level with am labs.   Pharmacy will continue to monitor and  adjust per consult.   Edwinna Rochette L 04/17/2018,5:34 PM

## 2018-04-17 NOTE — Progress Notes (Addendum)
ANTICOAGULATION CONSULT NOTE -  Pharmacy Consult for heparin drip Indication: chest pain/ACS  Allergies  Allergen Reactions  . Aspirin Swelling  . Codeine Itching  . Naprosyn [Naproxen] Other (See Comments)    Per MAR pt allergic  . Other     Elastic Bandages/ Supports Per MAR  . Tape Hives  . Valacyclovir     Patient Measurements: Height: 5\' 5"  (165.1 cm) Weight: 217 lb 13 oz (98.8 kg) IBW/kg (Calculated) : 57 Heparin Dosing Weight: 79 kg  Vital Signs: Temp: 98.7 F (37.1 C) (05/05 2000) Temp Source: Oral (05/05 2000) BP: 92/32 (05/05 2315) Pulse Rate: 84 (05/05 2315)  Labs: Recent Labs    04/15/18 0506 04/15/18 0852 04/15/18 1416  04/15/18 1929 04/15/18 2032  04/16/18 0517 04/16/18 1413 04/16/18 2147  HGB 12.1  --   --   --  11.1*  --   --  11.2*  --   --   HCT 35.9  --   --   --  33.2*  --   --  33.0*  --   --   PLT 277  --   --   --  284  --   --  321  --   --   APTT 29  --   --   --   --   --   --   --   --   --   LABPROT 12.8  --   --   --   --   --   --   --   --   --   INR 0.97  --   --   --   --   --   --   --   --   --   HEPARINUNFRC  --   --   --    < >  --   --    < > 0.26* 0.41 0.36  CREATININE 0.68  --   --   --  1.13*  --   --  0.79  --   --   TROPONINI 1.80* 2.84* 3.31*  --   --  3.38*  --   --   --   --    < > = values in this interval not displayed.    Estimated Creatinine Clearance: 71.8 mL/min (by C-G formula based on SCr of 0.79 mg/dL).   Medical History: Past Medical History:  Diagnosis Date  . Arthritis   . Cardiomyopathy ([place])   . Cataract   . Cerebral hemorrhage ([place])   . CHF (congestive heart failure) ([place])   . Chronic kidney disease   . Clotting disorder ([place])   . COPD (chronic obstructive pulmonary disease) ([place])   . Depression   . Diabetes mellitus without complication ([place])   . Hypertension   . Mixed incontinence   . Obesity   . Sleep apnea   . Thyroid disease   . Urinary frequency   . Vaginal atrophy   .  Varicose veins     Medications:  No anticoagulation in PTA meds.  Assessment: Trop 1.8  Goal of Therapy:  Heparin level 0.3-0.7 units/ml Monitor platelets by anticoagulation protocol: Yes   Plan:  4000 unit bolus and initial rate of 950 units/hr. First heparin level 8 hours after start of infusion.  5/4 1539 HL 0.16- Level is subtherapeutic. Will order 2000 unit bolus and increase infusion to 1150u/hr. Recheck heparin level in 8 hours.   5/5 0100 heparin level 0.35.  Continue current regimen. Recheck with AM labs to confirm.  5/5 AM heparin level 0.26. 1200 unit bolus and increase rate to 1300 units/hr. Recheck heparin level in 8 hours.  5/5 1413 heparin level= 0.41. Will continue current rate and check confirmatory level in 8 hrs.   5/5 2200 heparin level 0.36. Continue current regimen. Recheck heparin level and CBC with tomorrow AM labs.  5/6 AM heparin level 0.38. Continue current regimen. Recheck heparin level and CBC with tomorrow AM labs.   Eloise Harman, PharmD, BCPS Clinical Pharmacist 04/17/2018 12:01 AM

## 2018-04-17 NOTE — Progress Notes (Signed)
Pharmacy Antibiotic Note  Sara Pierce is a 77 y.o. female admitted on 04/15/2018. Patient admitted with NSTEMI. Pharmacy has been consulted for cefepime and vancomycin dosing for sepsis. Patient previously narrowed to cefazolin for proteus UTI on 5/6.   Plan: Cefepime 2g IV Q12hr   Vancomycin 1500mg  IV x 1 followed by vancomycin 1000mg  IV Q12hr for goal trough of 15-20. Will follow renal function daily and obtain trough as clinically indicated.    Height: 5\' 5"  (165.1 cm) Weight: 226 lb 13.7 oz (102.9 kg) IBW/kg (Calculated) : 57  Temp (24hrs), Avg:98.5 F (36.9 C), Min:97.5 F (36.4 C), Max:99.5 F (37.5 C)  Recent Labs  Lab 04/15/18 0506 04/15/18 1929 04/16/18 0517 04/17/18 0438  WBC 12.5* 11.5* 14.7* 14.3*  CREATININE 0.68 1.13* 0.79 0.78    Estimated Creatinine Clearance: 73.4 mL/min (by C-G formula based on SCr of 0.78 mg/dL).    Allergies  Allergen Reactions  . Aspirin Swelling  . Codeine Itching  . Naprosyn [Naproxen] Other (See Comments)    Per MAR pt allergic  . Other     Elastic Bandages/ Supports Per MAR  . Tape Hives  . Valacyclovir     Antimicrobials this admission: Ceftriaxone 5/4 >> 5/5 Zosyn 5/5 >> 5/6  Cefazolin 5/6 >> 5/6 Cefepime 5/6 >> Vancomycin 5/6 >>  Dose adjustments this admission: N/A  Microbiology results: 5/4  UCx: > 100K Proteus Mirabilis Sensitive to Cefazolin  5/4 MRSA PCR: negative   Thank you for allowing pharmacy to be a part of this patient's care.  Simpson,Michael L 04/17/2018 10:15 PM

## 2018-04-17 NOTE — Progress Notes (Signed)
Pharmacy Antibiotic Note  Sara Pierce is a 77 y.o. female admitted on 04/15/2018. Patient admitted with NSTEMI. Pharmacy has been consulted for Cefazolin dosing for Proteus UTI. Patient is currently ordered Zosyn EI 3.375g IV Q8hr.   Plan: Per AM ICU rounds, will narrow patient to cefazolin for total treatment course of 7 days for UTI. Will initiate patient on cefazolin 1g IV Q12hr.   Height: 5\' 5"  (165.1 cm) Weight: 226 lb 13.7 oz (102.9 kg) IBW/kg (Calculated) : 57  Temp (24hrs), Avg:98.6 F (37 C), Min:98.1 F (36.7 C), Max:99.1 F (37.3 C)  Recent Labs  Lab 04/15/18 0506 04/15/18 1929 04/16/18 0517 04/17/18 0438  WBC 12.5* 11.5* 14.7* 14.3*  CREATININE 0.68 1.13* 0.79 0.78    Estimated Creatinine Clearance: 73.4 mL/min (by C-G formula based on SCr of 0.78 mg/dL).    Allergies  Allergen Reactions  . Aspirin Swelling  . Codeine Itching  . Naprosyn [Naproxen] Other (See Comments)    Per MAR pt allergic  . Other     Elastic Bandages/ Supports Per MAR  . Tape Hives  . Valacyclovir     Antimicrobials this admission: Ceftriaxone 5/4 >> 5/5 Zosyn 5/5 >> 5/6  Cefazolin 5/6 >> 5/10  Dose adjustments this admission: N/A  Microbiology results: 5/4  UCx: > 100K Proteus Mirabilis Sensitive to Cefazolin  5/4 MRSA PCR: negative   Thank you for allowing pharmacy to be a part of this patient's care.  Ricardo Jericho 04/17/2018 11:12 AM

## 2018-04-18 LAB — CBC
HCT: 28.9 % — ABNORMAL LOW (ref 35.0–47.0)
Hemoglobin: 9.6 g/dL — ABNORMAL LOW (ref 12.0–16.0)
MCH: 31.9 pg (ref 26.0–34.0)
MCHC: 33.4 g/dL (ref 32.0–36.0)
MCV: 95.4 fL (ref 80.0–100.0)
Platelets: 232 10*3/uL (ref 150–440)
RBC: 3.02 MIL/uL — AB (ref 3.80–5.20)
RDW: 16.6 % — AB (ref 11.5–14.5)
WBC: 15.2 10*3/uL — AB (ref 3.6–11.0)

## 2018-04-18 LAB — BASIC METABOLIC PANEL
ANION GAP: 6 (ref 5–15)
BUN: 14 mg/dL (ref 6–20)
CALCIUM: 8.8 mg/dL — AB (ref 8.9–10.3)
CHLORIDE: 104 mmol/L (ref 101–111)
CO2: 25 mmol/L (ref 22–32)
CREATININE: 0.55 mg/dL (ref 0.44–1.00)
GFR calc Af Amer: >60 mL/min (ref >60)
GFR calc non Af Amer: >60 mL/min (ref >60)
GLUCOSE: 173 mg/dL — AB (ref 65–99)
Potassium: 3.8 mmol/L (ref 3.5–5.1)
Sodium: 135 mmol/L (ref 135–145)

## 2018-04-18 LAB — HEPARIN LEVEL (UNFRACTIONATED): HEPARIN UNFRACTIONATED: 0.39 [IU]/mL (ref 0.30–0.70)

## 2018-04-18 LAB — GLUCOSE, CAPILLARY
Glucose-Capillary: 175 mg/dL — ABNORMAL HIGH (ref 65–99)
Glucose-Capillary: 161 mg/dL — ABNORMAL HIGH (ref 65–99)
Glucose-Capillary: 154 mg/dL — ABNORMAL HIGH (ref 65–99)
Glucose-Capillary: 136 mg/dL — ABNORMAL HIGH (ref 65–99)

## 2018-04-18 LAB — MAGNESIUM: MAGNESIUM: 1.9 mg/dL (ref 1.7–2.4)

## 2018-04-18 NOTE — Progress Notes (Signed)
Follow up - Critical Care Medicine Note  Patient Details:    Sara Pierce is an 77 y.o. female.with a past medical history remarkable for arthritis, cardiomyopathy, chronic renal insufficiency, COPD, depression, diabetes, hypertension, obesity, sleep apnea admitted for an STEMI, hypotension. Echocardiogram reveals EF of 55-65%.  Lines, Airways, Drains: CVC Triple Lumen 04/15/18 Left Internal jugular (Active)  Indication for Insertion or Continuance of Line Vasoactive infusions 04/17/2018  8:00 AM  Site Assessment Clean;Dry;Intact 04/16/2018  8:00 PM  Proximal Lumen Status Infusing 04/16/2018  8:00 PM  Medial Lumen Status Infusing 04/16/2018  8:00 PM  Distal Lumen Status Infusing 04/16/2018  8:00 PM  Dressing Type Transparent;Occlusive 04/16/2018  8:00 PM  Dressing Status Clean;Dry;Intact;Antimicrobial disc in place 04/16/2018  8:00 PM  Line Care Connections checked and tightened 04/16/2018  8:00 PM  Dressing Change Due 04/22/18 04/16/2018  8:00 PM     External Urinary Catheter (Active)  Collection Container Dedicated Suction Canister 04/17/2018  4:00 AM  Securement Method Other (Comment) 04/15/2018  5:33 PM  Intervention Equipment Changed 04/17/2018  5:00 AM  Output (mL) 300 mL 04/17/2018  5:00 AM    Anti-infectives:  Anti-infectives (From admission, onward)   Start     Dose/Rate Route Frequency Ordered Stop   04/18/18 0800  vancomycin (VANCOCIN) IVPB 1000 mg/200 mL premix     1,000 mg 200 mL/hr over 60 Minutes Intravenous Every 12 hours 04/17/18 2215     04/17/18 2230  vancomycin (VANCOCIN) 1,500 mg in sodium chloride 0.9 % 500 mL IVPB     1,500 mg 250 mL/hr over 120 Minutes Intravenous  Once 04/17/18 2215 04/18/18 0150   04/17/18 2215  ceFEPIme (MAXIPIME) 2 g in sodium chloride 0.9 % 100 mL IVPB     2 g 200 mL/hr over 30 Minutes Intravenous Every 12 hours 04/17/18 2213     04/17/18 2215  vancomycin (VANCOCIN) IVPB 1000 mg/200 mL premix  Status:  Discontinued     1,000 mg 200 mL/hr over 60 Minutes  Intravenous  Once 04/17/18 2213 04/17/18 2215   04/17/18 1400  ceFAZolin (ANCEF) IVPB 1 g/50 mL premix  Status:  Discontinued     1 g 100 mL/hr over 30 Minutes Intravenous Every 8 hours 04/17/18 1012 04/17/18 1111   04/17/18 1115  ceFAZolin (ANCEF) IVPB 1 g/50 mL premix  Status:  Discontinued     1 g 100 mL/hr over 30 Minutes Intravenous Every 12 hours 04/17/18 1111 04/17/18 2204   04/16/18 1545  piperacillin-tazobactam (ZOSYN) IVPB 3.375 g  Status:  Discontinued     3.375 g 12.5 mL/hr over 240 Minutes Intravenous Every 8 hours 04/16/18 1539 04/17/18 1012   04/15/18 1900  cefTRIAXone (ROCEPHIN) 1 g in sodium chloride 0.9 % 100 mL IVPB  Status:  Discontinued     1 g 200 mL/hr over 30 Minutes Intravenous Every 24 hours 04/15/18 1838 04/16/18 1539   04/15/18 1100  cefTRIAXone (ROCEPHIN) 1 g in sodium chloride 0.9 % 100 mL IVPB  Status:  Discontinued     1 g 200 mL/hr over 30 Minutes Intravenous  Once 04/15/18 1013 04/15/18 1046   04/15/18 0615  cefTRIAXone (ROCEPHIN) 1 g in sodium chloride 0.9 % 100 mL IVPB     1 g 200 mL/hr over 30 Minutes Intravenous  Once 04/15/18 0602 04/15/18 1914      Microbiology: Results for orders placed or performed during the hospital encounter of 04/15/18  Urine culture     Status: Abnormal   Collection Time: 04/15/18  5:06 AM  Result Value Ref Range Status   Specimen Description   Final    URINE, CATHETERIZED Performed at [facility], [address] [place], [place] [address]    Special Requests   Final    NONE Performed at [facility], [address] [place], [place] [address]    Culture >=100,000 COLONIES/mL PROTEUS MIRABILIS (A)  Final   Report Status 04/17/2018 FINAL  Final   Organism ID, Bacteria PROTEUS MIRABILIS (A)  Final      Susceptibility   Proteus mirabilis - MIC*    AMPICILLIN <=2 SENSITIVE Sensitive     CEFAZOLIN <=4 SENSITIVE Sensitive     CEFTRIAXONE <=1 SENSITIVE Sensitive     CIPROFLOXACIN >=4 RESISTANT  Resistant     GENTAMICIN <=1 SENSITIVE Sensitive     IMIPENEM 1 SENSITIVE Sensitive     NITROFURANTOIN 128 RESISTANT Resistant     TRIMETH/SULFA >=320 RESISTANT Resistant     AMPICILLIN/SULBACTAM <=2 SENSITIVE Sensitive     PIP/TAZO <=4 SENSITIVE Sensitive     * >=100,000 COLONIES/mL PROTEUS MIRABILIS  MRSA PCR Screening     Status: None   Collection Time: 04/15/18  3:25 PM  Result Value Ref Range Status   MRSA by PCR NEGATIVE NEGATIVE Final    Comment:        The GeneXpert MRSA Assay (FDA approved for NASAL specimens only), is one component of a comprehensive MRSA colonization surveillance program. It is not intended to diagnose MRSA infection nor to guide or monitor treatment for MRSA infections. Performed at [facility], [address] [place],  [address]     Studies: Dg Chest 2 View  Result Date: 03/23/2018 CLINICAL DATA:  Congestion, productive cough, pneumonia, shortness of breath, chest pain, history CHF, COPD, cardiomyopathy EXAM: CHEST - 2 VIEW COMPARISON:  01/05/2018 FINDINGS: Slightly rotated to the LEFT. Borderline enlargement of cardiac silhouette. Atherosclerotic calcification aorta. Enlarged central pulmonary arteries question pulmonary arterial hypertension. Bibasilar and RIGHT mid lung atelectasis. No definite acute infiltrate, pleural effusion or pneumothorax. Abnormal RIGHT paramediastinal density superior to the RIGHT hilum at the level of the aortic arch is likely related to the marked compression fracture of the T6 vertebral body with paraspinal calcific debris and spurring as noted on prior CT exams, accentuated by rotation to the LEFT. Bones demineralized with note of a RIGHT shoulder prosthesis. IMPRESSION: Bibasilar atelectasis. No new abnormalities. Electronically Signed   By: Lavonia Dana M.D.   On: 03/23/2018 12:37   Ct Chest Wo Contrast  Result Date: 03/23/2018 CLINICAL DATA:  COPD exacerbation.  Dyspnea.  Productive cough. EXAM: CT  CHEST WITHOUT CONTRAST TECHNIQUE: Multidetector CT imaging of the chest was performed following the standard protocol without IV contrast. COMPARISON:  Chest radiograph from earlier today. 11/26/2017 chest CT. FINDINGS: Cardiovascular: Normal heart size. No significant pericardial fluid/thickening. Left main and 3 vessel coronary atherosclerosis. Atherosclerotic nonaneurysmal thoracic aorta. Stable prominently dilated main pulmonary artery (4.2 cm diameter). Mediastinum/Nodes: Hypodense 1.0 cm left thyroid lobe nodule. Unremarkable esophagus. No pathologically enlarged axillary, mediastinal or gross hilar lymph nodes, noting limited sensitivity for the detection of hilar adenopathy on this noncontrast study. Lungs/Pleura: No pneumothorax. No pleural effusion. Chronic bandlike opacities in the dependent right upper lobe and dependent/medial bilateral lower lobes with associated volume loss, compatible with chronic postinfectious/postinflammatory scarring. No acute consolidative airspace disease or lung masses. Sub solid 4 mm right middle lobe pulmonary nodule (series 3/image 80) is stable since 10/24/2017 chest CT. No new significant pulmonary  nodules. Upper abdomen: Small hiatal hernia. Stable thickening of the left greater than right adrenal glands without discrete adrenal nodules. Musculoskeletal: No aggressive appearing focal osseous lesions. Stable chronic severe T6 vertebral compression fracture. Marked thoracic spondylosis. Partially visualized right shoulder arthroplasty. Stable subcutaneous upper right back 1.8 cm sebaceous cyst. IMPRESSION: 1. Stable chronic postinfectious/postinflammatory scarring in the dependent right upper lobe and dependent bilateral lower lobes. No superimposed acute consolidative airspace disease. 2. Sub solid 4 mm right middle lobe pulmonary nodule, stable since 10/24/2017 chest CT, probably benign. No follow-up recommended. This recommendation follows the consensus statement:  Guidelines for Management of Incidental Pulmonary Nodules Detected on CT Images: From the Fleischner Society 2017; Radiology 2017; 284:228-243. 3. Stable prominently dilated main pulmonary artery, suggesting chronic pulmonary arterial hypertension. 4. Left main and 3 vessel coronary atherosclerosis. 5. Small hiatal hernia. Aortic Atherosclerosis (ICD10-I70.0). Electronically Signed   By: Ilona Sorrel M.D.   On: 03/23/2018 15:36   Ct Angio Chest Pe W Or Wo Contrast  Result Date: 04/17/2018 CLINICAL DATA:  77 year old female admitted with bilateral lower extremity pain and cramping. Mildly elevated troponin. Wheezing. EXAM: CT ANGIOGRAPHY CHEST WITH CONTRAST TECHNIQUE: Multidetector CT imaging of the chest was performed using the standard protocol during bolus administration of intravenous contrast. Multiplanar CT image reconstructions and MIPs were obtained to evaluate the vascular anatomy. CONTRAST:  79mL ISOVUE-370 IOPAMIDOL (ISOVUE-370) INJECTION 76% COMPARISON:  March 23, 2018 chest CT.  Chest x-ray Apr 17, 2018. FINDINGS: Cardiovascular: Atherosclerotic changes are seen in the thoracic aorta without aneurysm or dissection. Cardiomegaly is noted. Coronary artery calcifications are identified. The main pulmonary artery measures 4.3 cm which is dilated. Evaluation of pulmonary arteries is somewhat limited due to respiratory motion. Within this limitation, no emboli identified. Mediastinum/Nodes: A sebaceous cyst is seen over the right posterior shoulder, unchanged. The thyroid and esophagus are normal. Small pleural effusions. No pericardial effusion. No adenopathy. Lungs/Pleura: Small bilateral pleural effusions with underlying atelectasis. No suspicious infiltrate. Suspected mild edema with interlobular septal thickening in the apices. No suspicious nodules or masses. Upper Abdomen: No acute abnormality. Musculoskeletal: There is a compression fracture of an upper thoracic vertebral body, unchanged since March 23, 2018. Review of the MIP images confirms the above findings. IMPRESSION: 1. No pulmonary emboli identified. 2. Cardiomegaly, small effusions, and mild edema. 3. Atherosclerotic changes in the nonaneurysmal aorta. Coronary artery calcifications. 4. Dilatation of the main pulmonary artery measuring 4.3 cm. This is suggestive of pulmonary arterial hypertension. 5. Stable compression fracture of an upper thoracic vertebral body, unchanged since March 23, 2018. Aortic Atherosclerosis (ICD10-I70.0). Electronically Signed   By: Dorise Bullion III M.D   On: 04/17/2018 10:07   Dg Chest Port 1 View  Result Date: 04/17/2018 CLINICAL DATA:  Follow-up pneumonia EXAM: PORTABLE CHEST 1 VIEW COMPARISON:  04/15/2018 FINDINGS: Cardiac shadow is stable. Left jugular central line is again seen. Lungs are well aerated bilaterally. Persistent changes in the left base are seen. No new focal infiltrate is noted. Postsurgical changes in the right shoulder are seen. IMPRESSION: Stable left basilar changes.  No acute abnormality noted. Electronically Signed   By: Inez Catalina M.D.   On: 04/17/2018 07:17   Dg Chest Port 1 View  Result Date: 04/15/2018 CLINICAL DATA:  Central line placement. EXAM: PORTABLE CHEST 1 VIEW COMPARISON:  Radiograph of Apr 15, 2018. FINDINGS: Stable cardiomediastinal silhouette. Atherosclerosis of thoracic aorta is noted. Interval placement of left internal jugular catheter with distal tip in expected position of the SVC. No  pneumothorax is noted. Right lung is clear. Mild left basilar subsegmental atelectasis or scarring is noted with possible minimal left pleural effusion. Bony thorax is unremarkable. IMPRESSION: Interval placement of left internal jugular catheter with distal tip in expected position of the SVC. No pneumothorax is noted. Mild left basilar subsegmental atelectasis or scarring is noted with possible minimal left pleural effusion. Electronically Signed   By: Marijo Conception, M.D.   On:  04/15/2018 20:23   Dg Chest Port 1 View  Result Date: 04/15/2018 CLINICAL DATA:  77 year old female with cough. EXAM: PORTABLE CHEST 1 VIEW COMPARISON:  Chest CT dated 03/23/2018 FINDINGS: There is no focal consolidation, pleural effusion, or pneumothorax. The cardiac silhouette is within normal limits. Atherosclerotic calcification of the aorta. Old healed left humeral neck fracture and right shoulder hemiarthroplasty. No acute osseous pathology. IMPRESSION: No active disease. Electronically Signed   By: Anner Crete M.D.   On: 04/15/2018 06:00    Consults: Treatment Team:  Nicholes Mango, MD Teodoro Spray, MD Cassandria Santee, MD   Subjective:    Overnight Issues: yesterday patient developed increasing shortness of breath and work of breathing. Also requiring higher doses of pressors, Neo-Synephrine along with vasopressin added. Empirically antibiotics were broadened. She was also started on BiPAP. Appears more comfortable this morning with decreasing pressor requirements. Now on 25 g of norepinephrine and vasopressin.  Objective:  Vital signs for last 24 hours: Temp:  [97.5 F (36.4 C)-99.5 F (37.5 C)] 98.3 F (36.8 C) (05/07 0737) Pulse Rate:  [73-114] 107 (05/07 0700) Resp:  [13-26] 20 (05/07 0700) BP: (61-186)/(33-163) 92/62 (05/07 0700) SpO2:  [88 %-100 %] 97 % (05/07 0711) FiO2 (%):  [32 %] 32 % (05/06 1939) Weight:  [232 lb 2.3 oz (105.3 kg)] 232 lb 2.3 oz (105.3 kg) (05/07 0500)  Hemodynamic parameters for last 24 hours:    Intake/Output from previous day: 05/06 0701 - 05/07 0700 In: 1915.1 [I.V.:1765.1; IV Piggyback:150] Out: 600 [Urine:600]  Intake/Output this shift: No intake/output data recorded.  Vent settings for last 24 hours: FiO2 (%):  [32 %] 32 %  Physical Exam:   Vital signs: Please see the above listed vital signs HEENT: A shin has a left internal jugular central line, trachea is midline, no jugular venous distention is noted, no oral lesions  appreciated, minimal increased work of breathing Cardiovascular: Regular rate and rhythm Abdominal: Positive bowel sounds, soft exam Extremities: No lower extremity edema appreciated   Assessment/Plan:   Patient with NSTEMI. Mildly elevated troponin, requiring levofed. CT scan did not reveal pulmonary embolism. 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 She had a difficult time tolerating BiPAP last evening. Will add flutter valve. She is generally not responsive to bronchodilators. Pending cardiac catheterization when patient off of pressors and more stable  Shock/Leukocytosis.  Patient is presently on cefepime and vancomycin. Urinalysis shows Proteus. Antibiotics will broaden yesterday secondary to worsening pressor requirements.   Anemia. No evidence of active bleeding  Critical Care Total Time. Critical care time 40 minutes  Paulo Keimig 04/18/2018  *Care during the described time interval was provided by me and/or other providers on the critical care team.  I have reviewed this patient's available data, including medical history, events of note, physical examination and test results as part of my evaluation. Patient ID: Sara Pierce, female   DOB: 07-22-1941, 77 y.o.   MRN: [id number]

## 2018-04-18 NOTE — Progress Notes (Signed)
Pharmacy Electrolyte Monitoring Consult:  Pharmacy consulted to assist in monitoring and replacing electrolytes in this 77 y.o. female admitted on 04/15/2018 with NSTEMI.   Labs:  Sodium (mmol/L)  Date Value  04/18/2018 135  01/14/2015 137   Potassium (mmol/L)  Date Value  04/18/2018 3.8  01/14/2015 4.4   Magnesium (mg/dL)  Date Value  04/18/2018 1.9  05/05/2014 2.3   Phosphorus (mg/dL)  Date Value  04/17/2018 3.3   Calcium (mg/dL)  Date Value  04/18/2018 8.8 (L)   Calcium, Total (mg/dL)  Date Value  01/14/2015 9.1   Albumin (g/dL)  Date Value  03/23/2018 4.0  10/05/2014 3.4    Plan:  No replacement warranted at this time.   Will obtain electrolyte with am labs.   Pharmacy will continue to monitor and adjust per consult.   Rejina Odle L 04/18/2018 8:10 PM
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
## 2018-04-18 NOTE — Progress Notes (Addendum)
Patient Name: Sara Pierce Date of Encounter: 04/18/2018  Hospital Problem List     Active Problems:   NSTEMI (non-ST elevated myocardial infarction) [facility])    Patient Profile     Pt admitted with bilateral leg and foot pain with abnormal troponin. Now requiring levophed to maintain MAP of 65.    Subjective   No chest pain. Wheezing. Bilateral foot and leg pain.   Inpatient Medications    . ARIPiprazole  5 mg Oral QHS  . atorvastatin  40 mg Oral q1800  . budesonide  0.5 mg Nebulization BID  . buPROPion  150 mg Oral Daily  . chlorhexidine  15 mL Mouth Rinse BID  . clopidogrel  75 mg Oral Daily  . guaiFENesin  600 mg Oral BID   And  . dextromethorphan  30 mg Oral BID  . docusate sodium  100 mg Oral BID  . FLUoxetine  80 mg Oral Daily  . gabapentin  400 mg Oral TID  . hydrocortisone sod succinate (SOLU-CORTEF) inj  100 mg Intravenous Q8H  . insulin aspart  0-15 Units Subcutaneous TID WC  . insulin aspart  0-5 Units Subcutaneous QHS  . ipratropium-albuterol  3 mL Nebulization Q4H  . linagliptin  5 mg Oral Daily  . liothyronine  25 mcg Oral Daily  . mirtazapine  7.5 mg Oral QHS  . pantoprazole  40 mg Oral Daily  . sodium chloride flush  3 mL Intravenous Q12H  . sodium chloride flush  3 mL Intravenous Q12H  . [START ON 04/23/2018] Vitamin D (Ergocalciferol)  50,000 Units Oral Q30 days    Vital Signs    Vitals:   04/18/18 0300 04/18/18 0400 04/18/18 0500 04/18/18 0711  BP: (!) 115/46 121/77 (!) 135/116   Pulse: (!) 103 (!) 102 100   Resp: 16 15 (!) 23   Temp:      TempSrc:      SpO2: 99% 100% (!) 88% 97%  Weight:   105.3 kg (232 lb 2.3 oz)   Height:        Intake/Output Summary (Last 24 hours) at 04/18/2018 0723 Last data filed at 04/18/2018 0300 Gross per 24 hour  Intake 1915.07 ml  Output 600 ml  Net 1315.07 ml   Filed Weights   04/16/18 0420 04/17/18 0442 04/18/18 0500  Weight: 98.8 kg (217 lb 13 oz) 102.9 kg (226 lb 13.7 oz) 105.3 kg (232 lb 2.3 oz)     Physical Exam    GEN: Well nourished, well developed, complaining of leg pain and sob.  HEENT: normal.  Neck: Supple, no JVD, carotid bruits, or masses. Cardiac: RRR, no murmurs, rubs, or gallops. No edema  Radials/DP/PT 2+ and equal bilaterally.  Respiratory:  Respirations somewhat labored with bilaterl wheezing.  GI: Soft, nontender, nondistended, BS + x 4. MS: no deformity or atrophy. Skin: warm and dry, no rash. Neuro:  Strength and sensation are intact. Psych: Normal affect.  Labs    CBC Recent Labs    04/15/18 1929  04/17/18 0438 04/18/18 0552  WBC 11.5*   < > 14.3* 15.2*  NEUTROABS 9.0*  --  12.1*  --   HGB 11.1*   < > 10.4* 9.6*  HCT 33.2*   < > 30.9* 28.9*  MCV 94.9   < > 95.6 95.4  PLT 284   < > 249 232   < > = values in this interval not displayed.   Basic Metabolic Panel Recent Labs    04/16/18 0517  04/17/18 0438 04/18/18 0552  NA 139 137  --   K 3.9 4.2  --   CL 104 104  --   CO2 26 26  --   GLUCOSE 144* 149*  --   BUN 24* 19  --   CREATININE 0.79 0.78  --   CALCIUM 8.9 8.5*  --   MG 1.9 1.8 1.9  PHOS 3.9 3.3  --    Liver Function Tests No results for input(s): AST, ALT, ALKPHOS, BILITOT, PROT, ALBUMIN in the last 72 hours. No results for input(s): LIPASE, AMYLASE in the last 72 hours. Cardiac Enzymes Recent Labs    04/15/18 0852 04/15/18 1416 04/15/18 2032  TROPONINI 2.84* 3.31* 3.38*   BNP No results for input(s): BNP in the last 72 hours. D-Dimer No results for input(s): DDIMER in the last 72 hours. Hemoglobin A1C No results for input(s): HGBA1C in the last 72 hours. Fasting Lipid Panel Recent Labs    04/16/18 0517  CHOL 181  HDL 56  LDLCALC 105*  TRIG 98  CHOLHDL 3.2   Thyroid Function Tests Recent Labs    04/15/18 0852  TSH 1.357    Telemetry    Sinus tach  ECG    nsr with incomplete rbbb  Radiology    Dg Chest 2 View  Result Date: 03/23/2018 CLINICAL DATA:  Congestion, productive cough, pneumonia,  shortness of breath, chest pain, history CHF, COPD, cardiomyopathy EXAM: CHEST - 2 VIEW COMPARISON:  01/05/2018 FINDINGS: Slightly rotated to the LEFT. Borderline enlargement of cardiac silhouette. Atherosclerotic calcification aorta. Enlarged central pulmonary arteries question pulmonary arterial hypertension. Bibasilar and RIGHT mid lung atelectasis. No definite acute infiltrate, pleural effusion or pneumothorax. Abnormal RIGHT paramediastinal density superior to the RIGHT hilum at the level of the aortic arch is likely related to the marked compression fracture of the T6 vertebral body with paraspinal calcific debris and spurring as noted on prior CT exams, accentuated by rotation to the LEFT. Bones demineralized with note of a RIGHT shoulder prosthesis. IMPRESSION: Bibasilar atelectasis. No new abnormalities. Electronically Signed   By: Lavonia Dana M.D.   On: 03/23/2018 12:37   Ct Chest Wo Contrast  Result Date: 03/23/2018 CLINICAL DATA:  COPD exacerbation.  Dyspnea.  Productive cough. EXAM: CT CHEST WITHOUT CONTRAST TECHNIQUE: Multidetector CT imaging of the chest was performed following the standard protocol without IV contrast. COMPARISON:  Chest radiograph from earlier today. 11/26/2017 chest CT. FINDINGS: Cardiovascular: Normal heart size. No significant pericardial fluid/thickening. Left main and 3 vessel coronary atherosclerosis. Atherosclerotic nonaneurysmal thoracic aorta. Stable prominently dilated main pulmonary artery (4.2 cm diameter). Mediastinum/Nodes: Hypodense 1.0 cm left thyroid lobe nodule. Unremarkable esophagus. No pathologically enlarged axillary, mediastinal or gross hilar lymph nodes, noting limited sensitivity for the detection of hilar adenopathy on this noncontrast study. Lungs/Pleura: No pneumothorax. No pleural effusion. Chronic bandlike opacities in the dependent right upper lobe and dependent/medial bilateral lower lobes with associated volume loss, compatible with chronic  postinfectious/postinflammatory scarring. No acute consolidative airspace disease or lung masses. Sub solid 4 mm right middle lobe pulmonary nodule (series 3/image 80) is stable since 10/24/2017 chest CT. No new significant pulmonary nodules. Upper abdomen: Small hiatal hernia. Stable thickening of the left greater than right adrenal glands without discrete adrenal nodules. Musculoskeletal: No aggressive appearing focal osseous lesions. Stable chronic severe T6 vertebral compression fracture. Marked thoracic spondylosis. Partially visualized right shoulder arthroplasty. Stable subcutaneous upper right back 1.8 cm sebaceous cyst. IMPRESSION: 1. Stable chronic postinfectious/postinflammatory scarring in the dependent right upper  lobe and dependent bilateral lower lobes. No superimposed acute consolidative airspace disease. 2. Sub solid 4 mm right middle lobe pulmonary nodule, stable since 10/24/2017 chest CT, probably benign. No follow-up recommended. This recommendation follows the consensus statement: Guidelines for Management of Incidental Pulmonary Nodules Detected on CT Images: From the Fleischner Society 2017; Radiology 2017; 284:228-243. 3. Stable prominently dilated main pulmonary artery, suggesting chronic pulmonary arterial hypertension. 4. Left main and 3 vessel coronary atherosclerosis. 5. Small hiatal hernia. Aortic Atherosclerosis (ICD10-I70.0). Electronically Signed   By: Ilona Sorrel M.D.   On: 03/23/2018 15:36   Ct Angio Chest Pe W Or Wo Contrast  Result Date: 04/17/2018 CLINICAL DATA:  77 year old female admitted with bilateral lower extremity pain and cramping. Mildly elevated troponin. Wheezing. EXAM: CT ANGIOGRAPHY CHEST WITH CONTRAST TECHNIQUE: Multidetector CT imaging of the chest was performed using the standard protocol during bolus administration of intravenous contrast. Multiplanar CT image reconstructions and MIPs were obtained to evaluate the vascular anatomy. CONTRAST:  28mL ISOVUE-370  IOPAMIDOL (ISOVUE-370) INJECTION 76% COMPARISON:  March 23, 2018 chest CT.  Chest x-ray Apr 17, 2018. FINDINGS: Cardiovascular: Atherosclerotic changes are seen in the thoracic aorta without aneurysm or dissection. Cardiomegaly is noted. Coronary artery calcifications are identified. The main pulmonary artery measures 4.3 cm which is dilated. Evaluation of pulmonary arteries is somewhat limited due to respiratory motion. Within this limitation, no emboli identified. Mediastinum/Nodes: A sebaceous cyst is seen over the right posterior shoulder, unchanged. The thyroid and esophagus are normal. Small pleural effusions. No pericardial effusion. No adenopathy. Lungs/Pleura: Small bilateral pleural effusions with underlying atelectasis. No suspicious infiltrate. Suspected mild edema with interlobular septal thickening in the apices. No suspicious nodules or masses. Upper Abdomen: No acute abnormality. Musculoskeletal: There is a compression fracture of an upper thoracic vertebral body, unchanged since March 23, 2018. Review of the MIP images confirms the above findings. IMPRESSION: 1. No pulmonary emboli identified. 2. Cardiomegaly, small effusions, and mild edema. 3. Atherosclerotic changes in the nonaneurysmal aorta. Coronary artery calcifications. 4. Dilatation of the main pulmonary artery measuring 4.3 cm. This is suggestive of pulmonary arterial hypertension. 5. Stable compression fracture of an upper thoracic vertebral body, unchanged since March 23, 2018. Aortic Atherosclerosis (ICD10-I70.0). Electronically Signed   By: Dorise Bullion III M.D   On: 04/17/2018 10:07   Dg Chest Port 1 View  Result Date: 04/17/2018 CLINICAL DATA:  Follow-up pneumonia EXAM: PORTABLE CHEST 1 VIEW COMPARISON:  04/15/2018 FINDINGS: Cardiac shadow is stable. Left jugular central line is again seen. Lungs are well aerated bilaterally. Persistent changes in the left base are seen. No new focal infiltrate is noted. Postsurgical changes in  the right shoulder are seen. IMPRESSION: Stable left basilar changes.  No acute abnormality noted. Electronically Signed   By: Inez Catalina M.D.   On: 04/17/2018 07:17   Dg Chest Port 1 View  Result Date: 04/15/2018 CLINICAL DATA:  Central line placement. EXAM: PORTABLE CHEST 1 VIEW COMPARISON:  Radiograph of Apr 15, 2018. FINDINGS: Stable cardiomediastinal silhouette. Atherosclerosis of thoracic aorta is noted. Interval placement of left internal jugular catheter with distal tip in expected position of the SVC. No pneumothorax is noted. Right lung is clear. Mild left basilar subsegmental atelectasis or scarring is noted with possible minimal left pleural effusion. Bony thorax is unremarkable. IMPRESSION: Interval placement of left internal jugular catheter with distal tip in expected position of the SVC. No pneumothorax is noted. Mild left basilar subsegmental atelectasis or scarring is noted with possible minimal left pleural effusion.  Electronically Signed   By: Marijo Conception, M.D.   On: 04/15/2018 20:23   Dg Chest Port 1 View  Result Date: 04/15/2018 CLINICAL DATA:  77 year old female with cough. EXAM: PORTABLE CHEST 1 VIEW COMPARISON:  Chest CT dated 03/23/2018 FINDINGS: There is no focal consolidation, pleural effusion, or pneumothorax. The cardiac silhouette is within normal limits. Atherosclerotic calcification of the aorta. Old healed left humeral neck fracture and right shoulder hemiarthroplasty. No acute osseous pathology. IMPRESSION: No active disease. Electronically Signed   By: Anner Crete M.D.   On: 04/15/2018 06:00    Assessment & Plan    Elevated troponin-chest ct negative for pulmonary emboli. Still requiring levophed for maintaining MAP of 65. Mg 1.9. H/H 9.6/28.9. WIll conintue with heparin for now and attempt levophed wean. Will need consideration for cardiac cath when broncho spasm and hemodynamics are stable.   Hypotension-etiology unclear. EF appeared preserved by echo. No  PE by chest ct. No infiltrate of edema noted on cxr. Levophed wean. Emperic abx.  Leg pain-etiology unclear. APpears to be spasm   Bronchospasm. Bronchodilators.   Signed, Javier Docker Karmela Bram MD 04/18/2018, 7:23 AM  Pager: [phone]

## 2018-04-18 NOTE — Progress Notes (Signed)
Pharmacy Antibiotic Note  Sara Pierce is a 77 y.o. female admitted on 04/15/2018. Patient admitted with NSTEMI. Pharmacy has been consulted for cefepime and vancomycin dosing for sepsis.  Plan: Cefepime 2g IV Q12hr   Continue vancomycin 1000mg  IV Q12hr for goal trough of 15-20. Will follow renal function daily and obtain trough as clinically indicated.    Height: 5\' 5"  (165.1 cm) Weight: 232 lb 2.3 oz (105.3 kg) IBW/kg (Calculated) : 57  Temp (24hrs), Avg:98.4 F (36.9 C), Min:98.3 F (36.8 C), Max:98.5 F (36.9 C)  Recent Labs  Lab 04/15/18 0506 04/15/18 1929 04/16/18 0517 04/17/18 0438 04/18/18 0552  WBC 12.5* 11.5* 14.7* 14.3* 15.2*  CREATININE 0.68 1.13* 0.79 0.78 0.55    Estimated Creatinine Clearance: 74.3 mL/min (by C-G formula based on SCr of 0.55 mg/dL).    Allergies  Allergen Reactions  . Aspirin Swelling  . Codeine Itching  . Naprosyn [Naproxen] Other (See Comments)    Per MAR pt allergic  . Other     Elastic Bandages/ Supports Per MAR  . Tape Hives  . Valacyclovir     Antimicrobials this admission: Ceftriaxone 5/4 >> 5/5 Zosyn 5/5 >> 5/6  Cefazolin 5/6 >> 5/6 Cefepime 5/6 >> Vancomycin 5/6 >>  Dose adjustments this admission: N/A  Microbiology results: 5/4  UCx: > 100K Proteus Mirabilis Sensitive to Cefazolin  5/4 MRSA PCR: negative   Thank you for allowing pharmacy to be a part of this patient's care.  Simpson,Michael L 04/18/2018 8:10 PM

## 2018-04-18 NOTE — Progress Notes (Signed)
[place] for heparin drip management  Indication: NSTEMI  Allergies  Allergen Reactions  . Aspirin Swelling  . Codeine Itching  . Naprosyn [Naproxen] Other (See Comments)    Per MAR pt allergic  . Other     Elastic Bandages/ Supports Per MAR  . Tape Hives  . Valacyclovir     Patient Measurements: Height: 5\' 5"  (165.1 cm) Weight: 232 lb 2.3 oz (105.3 kg) IBW/kg (Calculated) : 57 Heparin Dosing Weight: 79 kg   Vital Signs: Temp: 98.2 F (36.8 C) (05/06 2000) Temp Source: Oral (05/06 2000) BP: 135/116 (05/07 0500) Pulse Rate: 100 (05/07 0500)  Labs: Recent Labs    04/15/18 0852 04/15/18 1416  04/15/18 1929 04/15/18 2032  04/16/18 0517  04/16/18 2147 04/17/18 0438 04/18/18 0552  HGB  --   --    < > 11.1*  --   --  11.2*  --   --  10.4* 9.6*  HCT  --   --    < > 33.2*  --   --  33.0*  --   --  30.9* 28.9*  PLT  --   --    < > 284  --   --  321  --   --  249 232  HEPARINUNFRC  --   --    < >  --   --    < > 0.26*   < > 0.36 0.38 0.39  CREATININE  --   --   --  1.13*  --   --  0.79  --   --  0.78  --   TROPONINI 2.84* 3.31*  --   --  3.38*  --   --   --   --   --   --    < > = values in this interval not displayed.    Estimated Creatinine Clearance: 74.3 mL/min (by C-G formula based on SCr of 0.78 mg/dL).   Medical History: Past Medical History:  Diagnosis Date  . Arthritis   . Cardiomyopathy ([place])   . Cataract   . Cerebral hemorrhage ([place])   . CHF (congestive heart failure) ([place])   . Chronic kidney disease   . Clotting disorder ([place])   . COPD (chronic obstructive pulmonary disease) ([place])   . Depression   . Diabetes mellitus without complication ([place])   . Hypertension   . Mixed incontinence   . Obesity   . Sleep apnea   . Thyroid disease   . Urinary frequency   . Vaginal atrophy   . Varicose veins     Medications:  Scheduled:  . ARIPiprazole  5 mg Oral QHS  . atorvastatin  40 mg Oral q1800  . budesonide   0.5 mg Nebulization BID  . buPROPion  150 mg Oral Daily  . chlorhexidine  15 mL Mouth Rinse BID  . clopidogrel  75 mg Oral Daily  . guaiFENesin  600 mg Oral BID   And  . dextromethorphan  30 mg Oral BID  . docusate sodium  100 mg Oral BID  . FLUoxetine  80 mg Oral Daily  . gabapentin  400 mg Oral TID  . hydrocortisone sod succinate (SOLU-CORTEF) inj  100 mg Intravenous Q8H  . insulin aspart  0-15 Units Subcutaneous TID WC  . insulin aspart  0-5 Units Subcutaneous QHS  . ipratropium-albuterol  3 mL Nebulization Q4H  . linagliptin  5 mg Oral Daily  . liothyronine  25 mcg Oral  Daily  . mirtazapine  7.5 mg Oral QHS  . pantoprazole  40 mg Oral Daily  . sodium chloride flush  3 mL Intravenous Q12H  . sodium chloride flush  3 mL Intravenous Q12H  . [START ON 04/23/2018] Vitamin D (Ergocalciferol)  50,000 Units Oral Q30 days   Infusions:  . sodium chloride Stopped (04/17/18 1530)  . ceFEPime (MAXIPIME) IV Stopped (04/18/18 0045)  . heparin 1,300 Units/hr (04/17/18 2000)  . norepinephrine (LEVOPHED) Adult infusion 25 mcg/min (04/18/18 0531)  . phenylephrine (NEO-SYNEPHRINE) Adult infusion Stopped (04/18/18 0412)  . sodium chloride    . vancomycin    . vasopressin (PITRESSIN) infusion - *FOR SHOCK* 0.03 Units/min (04/17/18 2000)    Assessment: Pharmacy consulted for heparin drip management for 77 yo female admitted with NSTEMI. Patient for CT on 5/6 with no PE identified. Patient currently receiving heparin at 1300 units/hr.   Goal of Therapy:  Heparin level 0.3-0.7 units/ml Monitor platelets by anticoagulation protocol: Yes   Plan:  Anti-Xa level currently in range, will continue patient on heparin 1300 units/hr. Will obtain follow up anti-Xa level with am labs.   05/07 @ 0600 HL 0.39 therapeutic. Will continue current rate and will recheck w/ am labs. CBC slowly trending down, will continue to monitor.  Pharmacy will continue to monitor and adjust per consult.   Tobie Lords,  PharmD, BCPS Clinical Pharmacist 04/18/2018

## 2018-04-18 NOTE — Progress Notes (Signed)
[place] for heparin drip management  Indication: NSTEMI  Allergies  Allergen Reactions  . Aspirin Swelling  . Codeine Itching  . Naprosyn [Naproxen] Other (See Comments)    Per MAR pt allergic  . Other     Elastic Bandages/ Supports Per MAR  . Tape Hives  . Valacyclovir     Patient Measurements: Height: 5\' 5"  (165.1 cm) Weight: 232 lb 2.3 oz (105.3 kg) IBW/kg (Calculated) : 57 Heparin Dosing Weight: 79 kg   Vital Signs: Temp: 98.5 F (36.9 C) (05/07 1600) Temp Source: Oral (05/07 1600) BP: 105/64 (05/07 1700) Pulse Rate: 95 (05/07 1700)  Labs: Recent Labs    04/15/18 2032  04/16/18 0517  04/16/18 2147 04/17/18 0438 04/18/18 0552  HGB  --    < > 11.2*  --   --  10.4* 9.6*  HCT  --   --  33.0*  --   --  30.9* 28.9*  PLT  --   --  321  --   --  249 232  HEPARINUNFRC  --    < > 0.26*   < > 0.36 0.38 0.39  CREATININE  --   --  0.79  --   --  0.78 0.55  TROPONINI 3.38*  --   --   --   --   --   --    < > = values in this interval not displayed.    Estimated Creatinine Clearance: 74.3 mL/min (by C-G formula based on SCr of 0.55 mg/dL).   Medical History: Past Medical History:  Diagnosis Date  . Arthritis   . Cardiomyopathy ([place])   . Cataract   . Cerebral hemorrhage ([place])   . CHF (congestive heart failure) ([place])   . Chronic kidney disease   . Clotting disorder ([place])   . COPD (chronic obstructive pulmonary disease) ([place])   . Depression   . Diabetes mellitus without complication ([place])   . Hypertension   . Mixed incontinence   . Obesity   . Sleep apnea   . Thyroid disease   . Urinary frequency   . Vaginal atrophy   . Varicose veins     Medications:  Scheduled:  . ARIPiprazole  5 mg Oral QHS  . atorvastatin  40 mg Oral q1800  . budesonide  0.5 mg Nebulization BID  . buPROPion  150 mg Oral Daily  . chlorhexidine  15 mL Mouth Rinse BID  . clopidogrel  75 mg Oral Daily  . guaiFENesin  600 mg Oral BID   And  .  dextromethorphan  30 mg Oral BID  . docusate sodium  100 mg Oral BID  . FLUoxetine  80 mg Oral Daily  . gabapentin  400 mg Oral TID  . hydrocortisone sod succinate (SOLU-CORTEF) inj  100 mg Intravenous Q8H  . insulin aspart  0-15 Units Subcutaneous TID WC  . insulin aspart  0-5 Units Subcutaneous QHS  . ipratropium-albuterol  3 mL Nebulization Q4H  . linagliptin  5 mg Oral Daily  . liothyronine  25 mcg Oral Daily  . mirtazapine  7.5 mg Oral QHS  . pantoprazole  40 mg Oral Daily  . sodium chloride flush  3 mL Intravenous Q12H  . sodium chloride flush  3 mL Intravenous Q12H  . [START ON 04/23/2018] Vitamin D (Ergocalciferol)  50,000 Units Oral Q30 days   Infusions:  . sodium chloride Stopped (04/17/18 1530)  . ceFEPime (MAXIPIME) IV Stopped (04/18/18 1035)  . heparin  1,300 Units/hr (04/18/18 1240)  . norepinephrine (LEVOPHED) Adult infusion 20 mcg/min (04/18/18 1441)  . phenylephrine (NEO-SYNEPHRINE) Adult infusion Stopped (04/18/18 0412)  . sodium chloride    . vancomycin 1,000 mg (04/18/18 1948)  . vasopressin (PITRESSIN) infusion - *FOR SHOCK* 0.03 Units/min (04/18/18 2003)    Assessment: Pharmacy consulted for heparin drip management for 77 yo female admitted with NSTEMI. Patient for CT on 5/6 with no PE identified. Patient currently receiving heparin at 1300 units/hr.   Goal of Therapy:  Heparin level 0.3-0.7 units/ml Monitor platelets by anticoagulation protocol: Yes   Plan:  Continue heparin at 1300 units/hr. Will recheck anti-Xa and CBC with am labs.  Pharmacy will continue to monitor and adjust per consult.   MLS 04/18/2018

## 2018-04-19 LAB — CBC WITH DIFFERENTIAL/PLATELET
BASOS ABS: 0 10*3/uL (ref 0–0.1)
BASOS PCT: 0 %
EOS ABS: 0 10*3/uL (ref 0–0.7)
Eosinophils Relative: 0 %
HEMATOCRIT: 26.8 % — AB (ref 35.0–47.0)
HEMOGLOBIN: 9 g/dL — AB (ref 12.0–16.0)
Lymphocytes Relative: 13 %
Lymphs Abs: 1.5 10*3/uL (ref 1.0–3.6)
MCH: 31.8 pg (ref 26.0–34.0)
MCHC: 33.6 g/dL (ref 32.0–36.0)
MCV: 94.7 fL (ref 80.0–100.0)
MONOS PCT: 6 %
Monocytes Absolute: 0.7 10*3/uL (ref 0.2–0.9)
NEUTROS ABS: 8.8 10*3/uL — AB (ref 1.4–6.5)
NEUTROS PCT: 81 %
Platelets: 226 10*3/uL (ref 150–440)
RBC: 2.84 MIL/uL — ABNORMAL LOW (ref 3.80–5.20)
RDW: 16.3 % — AB (ref 11.5–14.5)
WBC: 11 10*3/uL (ref 3.6–11.0)

## 2018-04-19 LAB — HEPARIN LEVEL (UNFRACTIONATED)
HEPARIN UNFRACTIONATED: 0.25 [IU]/mL — AB (ref 0.30–0.70)
HEPARIN UNFRACTIONATED: 0.46 [IU]/mL (ref 0.30–0.70)
HEPARIN UNFRACTIONATED: 0.42 [IU]/mL (ref 0.30–0.70)

## 2018-04-19 LAB — GLUCOSE, CAPILLARY
Glucose-Capillary: 160 mg/dL — ABNORMAL HIGH (ref 65–99)
Glucose-Capillary: 148 mg/dL — ABNORMAL HIGH (ref 65–99)
Glucose-Capillary: 111 mg/dL — ABNORMAL HIGH (ref 65–99)
Glucose-Capillary: 128 mg/dL — ABNORMAL HIGH (ref 65–99)

## 2018-04-19 MED ORDER — HEPARIN BOLUS VIA INFUSION
1200.0000 [IU] | Freq: Once | INTRAVENOUS | Status: AC
  Administered 2018-04-19: 1200 [IU] via INTRAVENOUS
  Filled 2018-04-19: qty 1200

## 2018-04-19 MED ORDER — SODIUM CHLORIDE 0.9% FLUSH
10.0000 mL | Freq: Two times a day (BID) | INTRAVENOUS | Status: DC
  Administered 2018-04-20 – 2018-04-22 (×3): 10 mL

## 2018-04-19 MED ORDER — SODIUM CHLORIDE 0.9% FLUSH
10.0000 mL | INTRAVENOUS | Status: DC | PRN
  Administered 2018-04-21: 10 mL
  Filled 2018-04-19: qty 40

## 2018-04-19 NOTE — Progress Notes (Signed)
[place] for heparin drip management  Indication: NSTEMI  Allergies  Allergen Reactions  . Aspirin Swelling  . Codeine Itching  . Naprosyn [Naproxen] Other (See Comments)    Per MAR pt allergic  . Other     Elastic Bandages/ Supports Per MAR  . Tape Hives  . Valacyclovir     Patient Measurements: Height: 5\' 5"  (165.1 cm) Weight: 242 lb 8.1 oz (110 kg) IBW/kg (Calculated) : 57 Heparin Dosing Weight: 79 kg   Vital Signs: Temp: 96.5 F (35.8 C) (05/08 1500) Temp Source: Axillary (05/08 1500) BP: 103/62 (05/08 1500) Pulse Rate: 83 (05/08 1500)  Labs: Recent Labs    04/17/18 0438 04/18/18 0552 04/19/18 0441 04/19/18 1428  HGB 10.4* 9.6* 9.0*  --   HCT 30.9* 28.9* 26.8*  --   PLT 249 232 226  --   HEPARINUNFRC 0.38 0.39 0.25* 0.46  CREATININE 0.78 0.55  --   --     Estimated Creatinine Clearance: 76.2 mL/min (by C-G formula based on SCr of 0.55 mg/dL).   Medical History: Past Medical History:  Diagnosis Date  . Arthritis   . Cardiomyopathy ([place])   . Cataract   . Cerebral hemorrhage ([place])   . CHF (congestive heart failure) ([place])   . Chronic kidney disease   . Clotting disorder ([place])   . COPD (chronic obstructive pulmonary disease) ([place])   . Depression   . Diabetes mellitus without complication ([place])   . Hypertension   . Mixed incontinence   . Obesity   . Sleep apnea   . Thyroid disease   . Urinary frequency   . Vaginal atrophy   . Varicose veins     Medications:  Scheduled:  . ARIPiprazole  5 mg Oral QHS  . atorvastatin  40 mg Oral q1800  . budesonide  0.5 mg Nebulization BID  . buPROPion  150 mg Oral Daily  . chlorhexidine  15 mL Mouth Rinse BID  . clopidogrel  75 mg Oral Daily  . guaiFENesin  600 mg Oral BID   And  . dextromethorphan  30 mg Oral BID  . docusate sodium  100 mg Oral BID  . FLUoxetine  80 mg Oral Daily  . gabapentin  400 mg Oral TID  . hydrocortisone sod succinate (SOLU-CORTEF) inj  100  mg Intravenous Q8H  . insulin aspart  0-15 Units Subcutaneous TID WC  . insulin aspart  0-5 Units Subcutaneous QHS  . linagliptin  5 mg Oral Daily  . liothyronine  25 mcg Oral Daily  . mirtazapine  7.5 mg Oral QHS  . pantoprazole  40 mg Oral Daily  . sodium chloride flush  3 mL Intravenous Q12H  . sodium chloride flush  3 mL Intravenous Q12H  . [START ON 04/23/2018] Vitamin D (Ergocalciferol)  50,000 Units Oral Q30 days   Infusions:  . sodium chloride Stopped (04/17/18 1530)  . ceFEPime (MAXIPIME) IV Stopped (04/19/18 1021)  . heparin 1,450 Units/hr (04/19/18 0912)  . norepinephrine (LEVOPHED) Adult infusion Stopped (04/19/18 1500)  . phenylephrine (NEO-SYNEPHRINE) Adult infusion Stopped (04/18/18 0412)  . sodium chloride    . vancomycin Stopped (04/19/18 1021)  . vasopressin (PITRESSIN) infusion - *FOR SHOCK* Stopped (04/19/18 0930)    Assessment: Pharmacy consulted for heparin drip management for 77 yo female admitted with NSTEMI. Patient for CT on 5/6 with no PE identified. Patient currently receiving heparin at 1450 units/hr.   Goal of Therapy:  Heparin level 0.3-0.7 units/ml Monitor platelets  by anticoagulation protocol: Yes   Plan:  Anti-Xa level is in range. Will continue patient on heparin drip at 1450 units/hr and obtain confirmatory level at 2230.   Pharmacy will continue to monitor and adjust per consult.   MLS 04/19/2018

## 2018-04-19 NOTE — Progress Notes (Addendum)
Pharmacy Antibiotic Note  Sara Pierce is a 77 y.o. female admitted on 04/15/2018. Patient admitted with NSTEMI. Pharmacy has been consulted for cefepime and vancomycin dosing for sepsis.   Plan: Cefepime 2g IV Q12hr   Continue vancomycin 1000mg  IV Q12hr for goal trough of 15-20. Will follow renal function daily and obtain trough prior to am dose on 5/9.   Per ICU rounds on 5/8, will treat for 8 days.    Height: 5\' 5"  (165.1 cm) Weight: 242 lb 8.1 oz (110 kg) IBW/kg (Calculated) : 57  Temp (24hrs), Avg:98 F (36.7 C), Min:96.5 F (35.8 C), Max:98.5 F (36.9 C)  Recent Labs  Lab 04/15/18 0506 04/15/18 1929 04/16/18 0517 04/17/18 0438 04/18/18 0552 04/19/18 0441  WBC 12.5* 11.5* 14.7* 14.3* 15.2* 11.0  CREATININE 0.68 1.13* 0.79 0.78 0.55  --     Estimated Creatinine Clearance: 76.2 mL/min (by C-G formula based on SCr of 0.55 mg/dL).    Allergies  Allergen Reactions  . Aspirin Swelling  . Codeine Itching  . Naprosyn [Naproxen] Other (See Comments)    Per MAR pt allergic  . Other     Elastic Bandages/ Supports Per MAR  . Tape Hives  . Valacyclovir     Antimicrobials this admission: Ceftriaxone 5/4 >> 5/5 Zosyn 5/5 >> 5/6  Cefazolin 5/6 >> 5/6 Cefepime 5/6 >> Vancomycin 5/6 >>  Dose adjustments this admission: N/A  Microbiology results: 5/4  UCx: > 100K Proteus Mirabilis Sensitive to Cefazolin  5/4 MRSA PCR: negative   Thank you for allowing pharmacy to be a part of this patient's care.  Simpson,Michael L 04/19/2018 4:52 PM
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
## 2018-04-19 NOTE — Progress Notes (Signed)
Follow up - Critical Care Medicine Note  Patient Details:    Sara Pierce is an 77 y.o. female.with a past medical history remarkable for arthritis, cardiomyopathy, chronic renal insufficiency, COPD, depression, diabetes, hypertension, obesity, sleep apnea admitted for an STEMI, hypotension. Echocardiogram reveals EF of 55-65%.  Lines, Airways, Drains: CVC Triple Lumen 04/15/18 Left Internal jugular (Active)  Indication for Insertion or Continuance of Line Vasoactive infusions 04/17/2018  8:00 AM  Site Assessment Clean;Dry;Intact 04/16/2018  8:00 PM  Proximal Lumen Status Infusing 04/16/2018  8:00 PM  Medial Lumen Status Infusing 04/16/2018  8:00 PM  Distal Lumen Status Infusing 04/16/2018  8:00 PM  Dressing Type Transparent;Occlusive 04/16/2018  8:00 PM  Dressing Status Clean;Dry;Intact;Antimicrobial disc in place 04/16/2018  8:00 PM  Line Care Connections checked and tightened 04/16/2018  8:00 PM  Dressing Change Due 04/22/18 04/16/2018  8:00 PM     External Urinary Catheter (Active)  Collection Container Dedicated Suction Canister 04/17/2018  4:00 AM  Securement Method Other (Comment) 04/15/2018  5:33 PM  Intervention Equipment Changed 04/17/2018  5:00 AM  Output (mL) 300 mL 04/17/2018  5:00 AM    Anti-infectives:  Anti-infectives (From admission, onward)   Start     Dose/Rate Route Frequency Ordered Stop   04/18/18 0800  vancomycin (VANCOCIN) IVPB 1000 mg/200 mL premix     1,000 mg 200 mL/hr over 60 Minutes Intravenous Every 12 hours 04/17/18 2215     04/17/18 2230  vancomycin (VANCOCIN) 1,500 mg in sodium chloride 0.9 % 500 mL IVPB     1,500 mg 250 mL/hr over 120 Minutes Intravenous  Once 04/17/18 2215 04/18/18 0150   04/17/18 2215  ceFEPIme (MAXIPIME) 2 g in sodium chloride 0.9 % 100 mL IVPB     2 g 200 mL/hr over 30 Minutes Intravenous Every 12 hours 04/17/18 2213     04/17/18 2215  vancomycin (VANCOCIN) IVPB 1000 mg/200 mL premix  Status:  Discontinued     1,000 mg 200 mL/hr over 60 Minutes  Intravenous  Once 04/17/18 2213 04/17/18 2215   04/17/18 1400  ceFAZolin (ANCEF) IVPB 1 g/50 mL premix  Status:  Discontinued     1 g 100 mL/hr over 30 Minutes Intravenous Every 8 hours 04/17/18 1012 04/17/18 1111   04/17/18 1115  ceFAZolin (ANCEF) IVPB 1 g/50 mL premix  Status:  Discontinued     1 g 100 mL/hr over 30 Minutes Intravenous Every 12 hours 04/17/18 1111 04/17/18 2204   04/16/18 1545  piperacillin-tazobactam (ZOSYN) IVPB 3.375 g  Status:  Discontinued     3.375 g 12.5 mL/hr over 240 Minutes Intravenous Every 8 hours 04/16/18 1539 04/17/18 1012   04/15/18 1900  cefTRIAXone (ROCEPHIN) 1 g in sodium chloride 0.9 % 100 mL IVPB  Status:  Discontinued     1 g 200 mL/hr over 30 Minutes Intravenous Every 24 hours 04/15/18 1838 04/16/18 1539   04/15/18 1100  cefTRIAXone (ROCEPHIN) 1 g in sodium chloride 0.9 % 100 mL IVPB  Status:  Discontinued     1 g 200 mL/hr over 30 Minutes Intravenous  Once 04/15/18 1013 04/15/18 1046   04/15/18 0615  cefTRIAXone (ROCEPHIN) 1 g in sodium chloride 0.9 % 100 mL IVPB     1 g 200 mL/hr over 30 Minutes Intravenous  Once 04/15/18 0602 04/15/18 8338      Microbiology: Results for orders placed or performed during the hospital encounter of 04/15/18  Urine culture     Status: Abnormal   Collection Time: 04/15/18  5:06 AM  Result Value Ref Range Status   Specimen Description   Final    URINE, CATHETERIZED Performed at [facility], [address] [place], [place] [address]    Special Requests   Final    NONE Performed at [facility], [address] [place], [place] [address]    Culture >=100,000 COLONIES/mL PROTEUS MIRABILIS (A)  Final   Report Status 04/17/2018 FINAL  Final   Organism ID, Bacteria PROTEUS MIRABILIS (A)  Final      Susceptibility   Proteus mirabilis - MIC*    AMPICILLIN <=2 SENSITIVE Sensitive     CEFAZOLIN <=4 SENSITIVE Sensitive     CEFTRIAXONE <=1 SENSITIVE Sensitive     CIPROFLOXACIN >=4 RESISTANT  Resistant     GENTAMICIN <=1 SENSITIVE Sensitive     IMIPENEM 1 SENSITIVE Sensitive     NITROFURANTOIN 128 RESISTANT Resistant     TRIMETH/SULFA >=320 RESISTANT Resistant     AMPICILLIN/SULBACTAM <=2 SENSITIVE Sensitive     PIP/TAZO <=4 SENSITIVE Sensitive     * >=100,000 COLONIES/mL PROTEUS MIRABILIS  MRSA PCR Screening     Status: None   Collection Time: 04/15/18  3:25 PM  Result Value Ref Range Status   MRSA by PCR NEGATIVE NEGATIVE Final    Comment:        The GeneXpert MRSA Assay (FDA approved for NASAL specimens only), is one component of a comprehensive MRSA colonization surveillance program. It is not intended to diagnose MRSA infection nor to guide or monitor treatment for MRSA infections. Performed at [facility], [address] [place],  [address]     Studies: Dg Chest 2 View  Result Date: 03/23/2018 CLINICAL DATA:  Congestion, productive cough, pneumonia, shortness of breath, chest pain, history CHF, COPD, cardiomyopathy EXAM: CHEST - 2 VIEW COMPARISON:  01/05/2018 FINDINGS: Slightly rotated to the LEFT. Borderline enlargement of cardiac silhouette. Atherosclerotic calcification aorta. Enlarged central pulmonary arteries question pulmonary arterial hypertension. Bibasilar and RIGHT mid lung atelectasis. No definite acute infiltrate, pleural effusion or pneumothorax. Abnormal RIGHT paramediastinal density superior to the RIGHT hilum at the level of the aortic arch is likely related to the marked compression fracture of the T6 vertebral body with paraspinal calcific debris and spurring as noted on prior CT exams, accentuated by rotation to the LEFT. Bones demineralized with note of a RIGHT shoulder prosthesis. IMPRESSION: Bibasilar atelectasis. No new abnormalities. Electronically Signed   By: Lavonia Dana M.D.   On: 03/23/2018 12:37   Ct Chest Wo Contrast  Result Date: 03/23/2018 CLINICAL DATA:  COPD exacerbation.  Dyspnea.  Productive cough. EXAM: CT  CHEST WITHOUT CONTRAST TECHNIQUE: Multidetector CT imaging of the chest was performed following the standard protocol without IV contrast. COMPARISON:  Chest radiograph from earlier today. 11/26/2017 chest CT. FINDINGS: Cardiovascular: Normal heart size. No significant pericardial fluid/thickening. Left main and 3 vessel coronary atherosclerosis. Atherosclerotic nonaneurysmal thoracic aorta. Stable prominently dilated main pulmonary artery (4.2 cm diameter). Mediastinum/Nodes: Hypodense 1.0 cm left thyroid lobe nodule. Unremarkable esophagus. No pathologically enlarged axillary, mediastinal or gross hilar lymph nodes, noting limited sensitivity for the detection of hilar adenopathy on this noncontrast study. Lungs/Pleura: No pneumothorax. No pleural effusion. Chronic bandlike opacities in the dependent right upper lobe and dependent/medial bilateral lower lobes with associated volume loss, compatible with chronic postinfectious/postinflammatory scarring. No acute consolidative airspace disease or lung masses. Sub solid 4 mm right middle lobe pulmonary nodule (series 3/image 80) is stable since 10/24/2017 chest CT. No new significant pulmonary  nodules. Upper abdomen: Small hiatal hernia. Stable thickening of the left greater than right adrenal glands without discrete adrenal nodules. Musculoskeletal: No aggressive appearing focal osseous lesions. Stable chronic severe T6 vertebral compression fracture. Marked thoracic spondylosis. Partially visualized right shoulder arthroplasty. Stable subcutaneous upper right back 1.8 cm sebaceous cyst. IMPRESSION: 1. Stable chronic postinfectious/postinflammatory scarring in the dependent right upper lobe and dependent bilateral lower lobes. No superimposed acute consolidative airspace disease. 2. Sub solid 4 mm right middle lobe pulmonary nodule, stable since 10/24/2017 chest CT, probably benign. No follow-up recommended. This recommendation follows the consensus statement:  Guidelines for Management of Incidental Pulmonary Nodules Detected on CT Images: From the Fleischner Society 2017; Radiology 2017; 284:228-243. 3. Stable prominently dilated main pulmonary artery, suggesting chronic pulmonary arterial hypertension. 4. Left main and 3 vessel coronary atherosclerosis. 5. Small hiatal hernia. Aortic Atherosclerosis (ICD10-I70.0). Electronically Signed   By: Ilona Sorrel M.D.   On: 03/23/2018 15:36   Ct Angio Chest Pe W Or Wo Contrast  Result Date: 04/17/2018 CLINICAL DATA:  77 year old female admitted with bilateral lower extremity pain and cramping. Mildly elevated troponin. Wheezing. EXAM: CT ANGIOGRAPHY CHEST WITH CONTRAST TECHNIQUE: Multidetector CT imaging of the chest was performed using the standard protocol during bolus administration of intravenous contrast. Multiplanar CT image reconstructions and MIPs were obtained to evaluate the vascular anatomy. CONTRAST:  62mL ISOVUE-370 IOPAMIDOL (ISOVUE-370) INJECTION 76% COMPARISON:  March 23, 2018 chest CT.  Chest x-ray Apr 17, 2018. FINDINGS: Cardiovascular: Atherosclerotic changes are seen in the thoracic aorta without aneurysm or dissection. Cardiomegaly is noted. Coronary artery calcifications are identified. The main pulmonary artery measures 4.3 cm which is dilated. Evaluation of pulmonary arteries is somewhat limited due to respiratory motion. Within this limitation, no emboli identified. Mediastinum/Nodes: A sebaceous cyst is seen over the right posterior shoulder, unchanged. The thyroid and esophagus are normal. Small pleural effusions. No pericardial effusion. No adenopathy. Lungs/Pleura: Small bilateral pleural effusions with underlying atelectasis. No suspicious infiltrate. Suspected mild edema with interlobular septal thickening in the apices. No suspicious nodules or masses. Upper Abdomen: No acute abnormality. Musculoskeletal: There is a compression fracture of an upper thoracic vertebral body, unchanged since March 23, 2018. Review of the MIP images confirms the above findings. IMPRESSION: 1. No pulmonary emboli identified. 2. Cardiomegaly, small effusions, and mild edema. 3. Atherosclerotic changes in the nonaneurysmal aorta. Coronary artery calcifications. 4. Dilatation of the main pulmonary artery measuring 4.3 cm. This is suggestive of pulmonary arterial hypertension. 5. Stable compression fracture of an upper thoracic vertebral body, unchanged since March 23, 2018. Aortic Atherosclerosis (ICD10-I70.0). Electronically Signed   By: Dorise Bullion III M.D   On: 04/17/2018 10:07   Dg Chest Port 1 View  Result Date: 04/17/2018 CLINICAL DATA:  Follow-up pneumonia EXAM: PORTABLE CHEST 1 VIEW COMPARISON:  04/15/2018 FINDINGS: Cardiac shadow is stable. Left jugular central line is again seen. Lungs are well aerated bilaterally. Persistent changes in the left base are seen. No new focal infiltrate is noted. Postsurgical changes in the right shoulder are seen. IMPRESSION: Stable left basilar changes.  No acute abnormality noted. Electronically Signed   By: Inez Catalina M.D.   On: 04/17/2018 07:17   Dg Chest Port 1 View  Result Date: 04/15/2018 CLINICAL DATA:  Central line placement. EXAM: PORTABLE CHEST 1 VIEW COMPARISON:  Radiograph of Apr 15, 2018. FINDINGS: Stable cardiomediastinal silhouette. Atherosclerosis of thoracic aorta is noted. Interval placement of left internal jugular catheter with distal tip in expected position of the SVC. No  pneumothorax is noted. Right lung is clear. Mild left basilar subsegmental atelectasis or scarring is noted with possible minimal left pleural effusion. Bony thorax is unremarkable. IMPRESSION: Interval placement of left internal jugular catheter with distal tip in expected position of the SVC. No pneumothorax is noted. Mild left basilar subsegmental atelectasis or scarring is noted with possible minimal left pleural effusion. Electronically Signed   By: Marijo Conception, M.D.   On:  04/15/2018 20:23   Dg Chest Port 1 View  Result Date: 04/15/2018 CLINICAL DATA:  77 year old female with cough. EXAM: PORTABLE CHEST 1 VIEW COMPARISON:  Chest CT dated 03/23/2018 FINDINGS: There is no focal consolidation, pleural effusion, or pneumothorax. The cardiac silhouette is within normal limits. Atherosclerotic calcification of the aorta. Old healed left humeral neck fracture and right shoulder hemiarthroplasty. No acute osseous pathology. IMPRESSION: No active disease. Electronically Signed   By: Anner Crete M.D.   On: 04/15/2018 06:00    Consults: Treatment Team:  Nicholes Mango, MD Teodoro Spray, MD Cassandria Santee, MD   Subjective:    Overnight Issues:patient improved. Respiratory status is less labored with stable oxygen saturation. Has decreased pressor requirements now down to 13 g of norepinephrine, Neo-Synephrine has been weaned off. Still complaining of chronic lower extremity pain, on gabapentin  Objective:  Vital signs for last 24 hours: Temp:  [98.4 F (36.9 C)-98.5 F (36.9 C)] 98.4 F (36.9 C) (05/07 2000) Pulse Rate:  [81-108] 92 (05/08 0700) Resp:  [13-22] 15 (05/08 0700) BP: (76-129)/(34-94) 127/78 (05/08 0700) SpO2:  [94 %-100 %] 98 % (05/08 0700) Weight:  [242 lb 8.1 oz (110 kg)] 242 lb 8.1 oz (110 kg) (05/08 0500)  Hemodynamic parameters for last 24 hours:    Intake/Output from previous day: 05/07 0701 - 05/08 0700 In: 2042.7 [P.O.:360; I.V.:1682.7] Out: 950 [Urine:950]  Intake/Output this shift: No intake/output data recorded.  Vent settings for last 24 hours:    Physical Exam:   Vital signs: Please see the above listed vital signs HEENT: A shin has a left internal jugular central line, trachea is midline, no jugular venous distention is noted, no oral lesions appreciated, minimal increased work of breathing Cardiovascular: Regular rate and rhythm Abdominal: Positive bowel sounds, soft exam Extremities: No lower extremity edema  appreciated   Assessment/Plan:   Patient with NSTEMI. Mildly elevated troponin, requiring levofed. CT scan did not reveal pulmonary embolism. 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  Pending cardiac catheterization when patient off of pressors and more stable  Shock/Leukocytosis.  Patient is presently on cefepime and vancomycin. Urinalysis shows Proteus. Antibiotics will broaden yesterday secondary to worsening pressor requirements. Has had improved hemodynamics over the last 24 hours, Neo-Synephrine has been weaned off and reducing dosing of norepinephrine. Continuing vasopressin  Anemia. No evidence of active bleeding  Critical Care Total Time. Critical care time 40 minutes  Vallerie Hentz 04/19/2018  *Care during the described time interval was provided by me and/or other providers on the critical care team.  I have reviewed this patient's available data, including medical history, events of note, physical examination and test results as part of my evaluation. Patient ID: Sara Pierce, female   DOB: May 21, 1941, 77 y.o.   MRN: [id number] Patient ID: Sara Pierce, female   DOB: 05-04-1941, 77 y.o.   MRN: [id number]

## 2018-04-19 NOTE — Progress Notes (Signed)
[facility] for heparin drip management  Indication: NSTEMI  Allergies  Allergen Reactions  . Aspirin Swelling  . Codeine Itching  . Naprosyn [Naproxen] Other (See Comments)    Per MAR pt allergic  . Other     Elastic Bandages/ Supports Per MAR  . Tape Hives  . Valacyclovir     Patient Measurements: Height: 5\' 5"  (165.1 cm) Weight: 242 lb 8.1 oz (110 kg) IBW/kg (Calculated) : 57 Heparin Dosing Weight: 79 kg   Vital Signs: Temp: 98.4 F (36.9 C) (05/07 2000) Temp Source: Oral (05/07 2000) BP: 128/81 (05/08 0530) Pulse Rate: 91 (05/08 0530)  Labs: Recent Labs    04/17/18 0438 04/18/18 0552 04/19/18 0441  HGB 10.4* 9.6* 9.0*  HCT 30.9* 28.9* 26.8*  PLT 249 232 226  HEPARINUNFRC 0.38 0.39 0.25*  CREATININE 0.78 0.55  --     Estimated Creatinine Clearance: 76.2 mL/min (by C-G formula based on SCr of 0.55 mg/dL).   Medical History: Past Medical History:  Diagnosis Date  . Arthritis   . Cardiomyopathy ([place])   . Cataract   . Cerebral hemorrhage ([place])   . CHF (congestive heart failure) ([place])   . Chronic kidney disease   . Clotting disorder ([place])   . COPD (chronic obstructive pulmonary disease) ([place])   . Depression   . Diabetes mellitus without complication ([place])   . Hypertension   . Mixed incontinence   . Obesity   . Sleep apnea   . Thyroid disease   . Urinary frequency   . Vaginal atrophy   . Varicose veins     Medications:  Scheduled:  . ARIPiprazole  5 mg Oral QHS  . atorvastatin  40 mg Oral q1800  . budesonide  0.5 mg Nebulization BID  . buPROPion  150 mg Oral Daily  . chlorhexidine  15 mL Mouth Rinse BID  . clopidogrel  75 mg Oral Daily  . guaiFENesin  600 mg Oral BID   And  . dextromethorphan  30 mg Oral BID  . docusate sodium  100 mg Oral BID  . FLUoxetine  80 mg Oral Daily  . gabapentin  400 mg Oral TID  . heparin  1,200 Units Intravenous Once  . hydrocortisone sod succinate (SOLU-CORTEF) inj  100  mg Intravenous Q8H  . insulin aspart  0-15 Units Subcutaneous TID WC  . insulin aspart  0-5 Units Subcutaneous QHS  . linagliptin  5 mg Oral Daily  . liothyronine  25 mcg Oral Daily  . mirtazapine  7.5 mg Oral QHS  . pantoprazole  40 mg Oral Daily  . sodium chloride flush  3 mL Intravenous Q12H  . sodium chloride flush  3 mL Intravenous Q12H  . [START ON 04/23/2018] Vitamin D (Ergocalciferol)  50,000 Units Oral Q30 days   Infusions:  . sodium chloride Stopped (04/17/18 1530)  . ceFEPime (MAXIPIME) IV 2 g (04/18/18 2236)  . heparin 1,300 Units/hr (04/18/18 1240)  . norepinephrine (LEVOPHED) Adult infusion 15 mcg/min (04/19/18 0430)  . phenylephrine (NEO-SYNEPHRINE) Adult infusion Stopped (04/18/18 0412)  . sodium chloride    . vancomycin 1,000 mg (04/18/18 1948)  . vasopressin (PITRESSIN) infusion - *FOR SHOCK* 0.03 Units/min (04/18/18 1610)    Assessment: Pharmacy consulted for heparin drip management for 77 yo female admitted with NSTEMI. Patient for CT on 5/6 with no PE identified. Patient currently receiving heparin at 1300 units/hr.   Goal of Therapy:  Heparin level 0.3-0.7 units/ml Monitor platelets by anticoagulation protocol:  Yes   Plan:  05/08 @ 0500 HL 0.25 subtherapeutic. Will rebolus w/ heparin 1200 units IV x 1 and increase rate to 1450 units/hr and will recheck @ 1300, CBC trending down, will continue to monitor.  Tobie Lords, PharmD, BCPS Clinical Pharmacist 04/19/2018

## 2018-04-20 LAB — CBC
HCT: 26.5 % — ABNORMAL LOW (ref 35.0–47.0)
Hemoglobin: 9 g/dL — ABNORMAL LOW (ref 12.0–16.0)
MCH: 32.6 pg (ref 26.0–34.0)
MCHC: 34 g/dL (ref 32.0–36.0)
MCV: 95.8 fL (ref 80.0–100.0)
PLATELETS: 180 10*3/uL (ref 150–440)
RBC: 2.77 MIL/uL — AB (ref 3.80–5.20)
RDW: 16.4 % — AB (ref 11.5–14.5)
WBC: 5.3 10*3/uL (ref 3.6–11.0)

## 2018-04-20 LAB — GLUCOSE, CAPILLARY
Glucose-Capillary: 133 mg/dL — ABNORMAL HIGH (ref 65–99)
Glucose-Capillary: 113 mg/dL — ABNORMAL HIGH (ref 65–99)
Glucose-Capillary: 137 mg/dL — ABNORMAL HIGH (ref 65–99)
Glucose-Capillary: 111 mg/dL — ABNORMAL HIGH (ref 65–99)

## 2018-04-20 LAB — BASIC METABOLIC PANEL
ANION GAP: 4 — AB (ref 5–15)
BUN: 17 mg/dL (ref 6–20)
CALCIUM: 9.3 mg/dL (ref 8.9–10.3)
CO2: 27 mmol/L (ref 22–32)
Chloride: 100 mmol/L — ABNORMAL LOW (ref 101–111)
Creatinine, Ser: 0.6 mg/dL (ref 0.44–1.00)
GFR calc Af Amer: >60 mL/min (ref >60)
Glucose, Bld: 139 mg/dL — ABNORMAL HIGH (ref 65–99)
POTASSIUM: 4 mmol/L (ref 3.5–5.1)
SODIUM: 131 mmol/L — AB (ref 135–145)

## 2018-04-20 LAB — VANCOMYCIN, TROUGH: VANCOMYCIN TR: 25 ug/mL — AB (ref 15–20)

## 2018-04-20 LAB — HEPARIN LEVEL (UNFRACTIONATED): HEPARIN UNFRACTIONATED: 0.6 [IU]/mL (ref 0.30–0.70)

## 2018-04-20 MED ORDER — VANCOMYCIN HCL IN DEXTROSE 1-5 GM/200ML-% IV SOLN
1000.0000 mg | INTRAVENOUS | Status: DC
  Administered 2018-04-20 – 2018-04-21 (×3): 1000 mg via INTRAVENOUS
  Filled 2018-04-20 (×4): qty 200

## 2018-04-20 NOTE — Progress Notes (Signed)
Pharmacy Antibiotic Note  Sara Pierce is a 77 y.o. female admitted on 04/15/2018. Patient admitted with NSTEMI. Pharmacy has been consulted for cefepime and vancomycin dosing for sepsis.   Plan: Cefepime 2g IV Q12hr   Vancomycin trough 25, will transition patient to vancomycin 1000mg  IV Q18hr for goal trough of 15-20. Will follow renal function every 24-48 hours and obtain trough as clinically indicated.   Per ICU rounds on 5/8, will treat for 8 days.    Height: 5\' 5"  (165.1 cm) Weight: 242 lb 8.1 oz (110 kg) IBW/kg (Calculated) : 57  Temp (24hrs), Avg:97.5 F (36.4 C), Min:96.5 F (35.8 C), Max:98.4 F (36.9 C)  Recent Labs  Lab 04/15/18 1929 04/16/18 0517 04/17/18 0438 04/18/18 0552 04/19/18 0441 04/20/18 0446 04/20/18 0817  WBC 11.5* 14.7* 14.3* 15.2* 11.0 5.3  --   CREATININE 1.13* 0.79 0.78 0.55  --  0.60  --   VANCOTROUGH  --   --   --   --   --   --  25*    Estimated Creatinine Clearance: 76.2 mL/min (by C-G formula based on SCr of 0.6 mg/dL).    Allergies  Allergen Reactions  . Aspirin Swelling  . Codeine Itching  . Naprosyn [Naproxen] Other (See Comments)    Per MAR pt allergic  . Other     Elastic Bandages/ Supports Per MAR  . Tape Hives  . Valacyclovir     Antimicrobials this admission: Ceftriaxone 5/4 >> 5/5 Zosyn 5/5 >> 5/6  Cefazolin 5/6 >> 5/6 Cefepime 5/6 >> 5/13 Vancomycin 5/6 >> 5/13  Dose adjustments this admission: 5/9 Vancomycin transitioned to 1000mg  IV Q18hr.   Microbiology results: 5/4  UCx: > 100K Proteus Mirabilis Sensitive to Cefazolin  5/4 MRSA PCR: negative   Thank you for allowing pharmacy to be a part of this patient's care.  Simpson,Michael L 04/20/2018 11:52 AM

## 2018-04-20 NOTE — Progress Notes (Signed)
[facility] for heparin drip management  Indication: NSTEMI  Allergies  Allergen Reactions  . Aspirin Swelling  . Codeine Itching  . Naprosyn [Naproxen] Other (See Comments)    Per MAR pt allergic  . Other     Elastic Bandages/ Supports Per MAR  . Tape Hives  . Valacyclovir     Patient Measurements: Height: 5\' 5"  (165.1 cm) Weight: 242 lb 8.1 oz (110 kg) IBW/kg (Calculated) : 57 Heparin Dosing Weight: 79 kg   Vital Signs: Temp: 97.7 F (36.5 C) (05/09 0200) Temp Source: Axillary (05/09 0200) BP: 90/56 (05/09 0530) Pulse Rate: 81 (05/09 0530)  Labs: Recent Labs    04/18/18 0552 04/19/18 0441 04/19/18 1428 04/19/18 2146 04/20/18 0446  HGB 9.6* 9.0*  --   --  9.0*  HCT 28.9* 26.8*  --   --  26.5*  PLT 232 226  --   --  180  HEPARINUNFRC 0.39 0.25* 0.46 0.42 0.60  CREATININE 0.55  --   --   --  0.60    Estimated Creatinine Clearance: 76.2 mL/min (by C-G formula based on SCr of 0.6 mg/dL).   Medical History: Past Medical History:  Diagnosis Date  . Arthritis   . Cardiomyopathy ([place])   . Cataract   . Cerebral hemorrhage ([place])   . CHF (congestive heart failure) ([place])   . Chronic kidney disease   . Clotting disorder ([place])   . COPD (chronic obstructive pulmonary disease) ([place])   . Depression   . Diabetes mellitus without complication ([place])   . Hypertension   . Mixed incontinence   . Obesity   . Sleep apnea   . Thyroid disease   . Urinary frequency   . Vaginal atrophy   . Varicose veins     Medications:  Scheduled:  . ARIPiprazole  5 mg Oral QHS  . atorvastatin  40 mg Oral q1800  . budesonide  0.5 mg Nebulization BID  . buPROPion  150 mg Oral Daily  . chlorhexidine  15 mL Mouth Rinse BID  . clopidogrel  75 mg Oral Daily  . guaiFENesin  600 mg Oral BID   And  . dextromethorphan  30 mg Oral BID  . docusate sodium  100 mg Oral BID  . FLUoxetine  80 mg Oral Daily  . gabapentin  400 mg Oral TID  . hydrocortisone  sod succinate (SOLU-CORTEF) inj  100 mg Intravenous Q8H  . insulin aspart  0-15 Units Subcutaneous TID WC  . insulin aspart  0-5 Units Subcutaneous QHS  . linagliptin  5 mg Oral Daily  . liothyronine  25 mcg Oral Daily  . mirtazapine  7.5 mg Oral QHS  . pantoprazole  40 mg Oral Daily  . sodium chloride flush  10-40 mL Intracatheter Q12H  . sodium chloride flush  3 mL Intravenous Q12H  . sodium chloride flush  3 mL Intravenous Q12H  . [START ON 04/23/2018] Vitamin D (Ergocalciferol)  50,000 Units Oral Q30 days   Infusions:  . sodium chloride Stopped (04/17/18 1530)  . ceFEPime (MAXIPIME) IV 2 g (04/19/18 2128)  . heparin 1,450 Units/hr (04/20/18 0500)  . norepinephrine (LEVOPHED) Adult infusion Stopped (04/19/18 1500)  . phenylephrine (NEO-SYNEPHRINE) Adult infusion Stopped (04/18/18 0412)  . sodium chloride    . vancomycin 1,000 mg (04/19/18 2128)  . vasopressin (PITRESSIN) infusion - *FOR SHOCK* Stopped (04/19/18 0930)    Assessment: Pharmacy consulted for heparin drip management for 77 yo female admitted with NSTEMI. Patient  for CT on 5/6 with no PE identified. Patient currently receiving heparin at 1300 units/hr.   Goal of Therapy:  Heparin level 0.3-0.7 units/ml Monitor platelets by anticoagulation protocol: Yes   Plan:  05/09 @ 0500 HL 0.60 therapeutic. Will continue @ 1450 units/hr and recheck w/ am labs. CBC stable, but trended down from baseline.  Tobie Lords, PharmD, BCPS Clinical Pharmacist 04/20/2018

## 2018-04-20 NOTE — Progress Notes (Signed)
[facility] Encounter Note  Patient: Sara Pierce / Admit Date: 04/15/2018 / Date of Encounter: 04/20/2018, 8:14 AM   Subjective: She had a good night with no further evidence of chest pain shortness of breath or new cough or congestion.  Patient is hemodynamically stable and improving overnight.  The patient has had improvements in infection.  No evidence of rhythm disturbances overnight  Review of Systems: Positive for: Weakness Negative for: Vision change, hearing change, syncope, dizziness, nausea, vomiting,diarrhea, bloody stool, stomach pain, cough, congestion, diaphoresis, urinary frequency, urinary pain,skin lesions, skin rashes Others previously listed  Objective: Telemetry: Normal sinus rhythm Physical Exam: Blood pressure (!) 89/60, pulse 84, temperature 97.7 F (36.5 C), temperature source Axillary, resp. rate 13, height 5\' 5"  (1.651 m), weight 242 lb 8.1 oz (110 kg), SpO2 100 %. Body mass index is 40.36 kg/m. General: Well developed, well nourished, in no acute distress. Head: Normocephalic, atraumatic, sclera non-icteric, no xanthomas, nares are without discharge. Neck: No apparent masses Lungs: Normal respirations with no wheezes, some rhonchi, no rales , no crackles   Heart: Regular rate and rhythm, normal S1 S2, no murmur, no rub, no gallop, PMI is normal size and placement, carotid upstroke normal without bruit, jugular venous pressure normal Abdomen: Soft, non-tender, non-distended with normoactive bowel sounds. No hepatosplenomegaly. Abdominal aorta is normal size without bruit Extremities: Trace edema, no clubbing, no cyanosis, no ulcers,  Peripheral: 2+ radial, 2+ femoral, 2+ dorsal pedal pulses Neuro: Alert and oriented. Moves all extremities spontaneously. Psych:  Responds to questions appropriately with a normal affect.   Intake/Output Summary (Last 24 hours) at 04/20/2018 0814 Last data filed at 04/20/2018 0600 Gross per 24 hour  Intake  1071.55 ml  Output 865 ml  Net 206.55 ml    Inpatient Medications:  . ARIPiprazole  5 mg Oral QHS  . atorvastatin  40 mg Oral q1800  . budesonide  0.5 mg Nebulization BID  . buPROPion  150 mg Oral Daily  . chlorhexidine  15 mL Mouth Rinse BID  . clopidogrel  75 mg Oral Daily  . guaiFENesin  600 mg Oral BID   And  . dextromethorphan  30 mg Oral BID  . docusate sodium  100 mg Oral BID  . FLUoxetine  80 mg Oral Daily  . gabapentin  400 mg Oral TID  . hydrocortisone sod succinate (SOLU-CORTEF) inj  100 mg Intravenous Q8H  . insulin aspart  0-15 Units Subcutaneous TID WC  . insulin aspart  0-5 Units Subcutaneous QHS  . linagliptin  5 mg Oral Daily  . liothyronine  25 mcg Oral Daily  . mirtazapine  7.5 mg Oral QHS  . pantoprazole  40 mg Oral Daily  . sodium chloride flush  10-40 mL Intracatheter Q12H  . sodium chloride flush  3 mL Intravenous Q12H  . sodium chloride flush  3 mL Intravenous Q12H  . [START ON 04/23/2018] Vitamin D (Ergocalciferol)  50,000 Units Oral Q30 days   Infusions:  . sodium chloride Stopped (04/17/18 1530)  . ceFEPime (MAXIPIME) IV 2 g (04/19/18 2128)  . heparin 1,450 Units/hr (04/20/18 0500)  . norepinephrine (LEVOPHED) Adult infusion Stopped (04/19/18 1500)  . phenylephrine (NEO-SYNEPHRINE) Adult infusion Stopped (04/18/18 0412)  . sodium chloride    . vancomycin 1,000 mg (04/19/18 2128)  . vasopressin (PITRESSIN) infusion - *FOR SHOCK* Stopped (04/19/18 0930)    Labs: Recent Labs    04/18/18 0552 04/20/18 0446  NA 135 131*  K 3.8 4.0  CL 104  100*  CO2 25 27  GLUCOSE 173* 139*  BUN 14 17  CREATININE 0.55 0.60  CALCIUM 8.8* 9.3  MG 1.9  --    No results for input(s): AST, ALT, ALKPHOS, BILITOT, PROT, ALBUMIN in the last 72 hours. Recent Labs    04/19/18 0441 04/20/18 0446  WBC 11.0 5.3  NEUTROABS 8.8*  --   HGB 9.0* 9.0*  HCT 26.8* 26.5*  MCV 94.7 95.8  PLT 226 180   No results for input(s): CKTOTAL, CKMB, TROPONINI in the last 72  hours. Invalid input(s): POCBNP No results for input(s): HGBA1C in the last 72 hours.   Weights: Filed Weights   04/18/18 0500 04/19/18 0500 04/20/18 0500  Weight: 232 lb 2.3 oz (105.3 kg) 242 lb 8.1 oz (110 kg) 242 lb 8.1 oz (110 kg)     Radiology/Studies:  Dg Chest 2 View  Result Date: 03/23/2018 CLINICAL DATA:  Congestion, productive cough, pneumonia, shortness of breath, chest pain, history CHF, COPD, cardiomyopathy EXAM: CHEST - 2 VIEW COMPARISON:  01/05/2018 FINDINGS: Slightly rotated to the LEFT. Borderline enlargement of cardiac silhouette. Atherosclerotic calcification aorta. Enlarged central pulmonary arteries question pulmonary arterial hypertension. Bibasilar and RIGHT mid lung atelectasis. No definite acute infiltrate, pleural effusion or pneumothorax. Abnormal RIGHT paramediastinal density superior to the RIGHT hilum at the level of the aortic arch is likely related to the marked compression fracture of the T6 vertebral body with paraspinal calcific debris and spurring as noted on prior CT exams, accentuated by rotation to the LEFT. Bones demineralized with note of a RIGHT shoulder prosthesis. IMPRESSION: Bibasilar atelectasis. No new abnormalities. Electronically Signed   By: Lavonia Dana M.D.   On: 03/23/2018 12:37   Ct Chest Wo Contrast  Result Date: 03/23/2018 CLINICAL DATA:  COPD exacerbation.  Dyspnea.  Productive cough. EXAM: CT CHEST WITHOUT CONTRAST TECHNIQUE: Multidetector CT imaging of the chest was performed following the standard protocol without IV contrast. COMPARISON:  Chest radiograph from earlier today. 11/26/2017 chest CT. FINDINGS: Cardiovascular: Normal heart size. No significant pericardial fluid/thickening. Left main and 3 vessel coronary atherosclerosis. Atherosclerotic nonaneurysmal thoracic aorta. Stable prominently dilated main pulmonary artery (4.2 cm diameter). Mediastinum/Nodes: Hypodense 1.0 cm left thyroid lobe nodule. Unremarkable esophagus. No  pathologically enlarged axillary, mediastinal or gross hilar lymph nodes, noting limited sensitivity for the detection of hilar adenopathy on this noncontrast study. Lungs/Pleura: No pneumothorax. No pleural effusion. Chronic bandlike opacities in the dependent right upper lobe and dependent/medial bilateral lower lobes with associated volume loss, compatible with chronic postinfectious/postinflammatory scarring. No acute consolidative airspace disease or lung masses. Sub solid 4 mm right middle lobe pulmonary nodule (series 3/image 80) is stable since 10/24/2017 chest CT. No new significant pulmonary nodules. Upper abdomen: Small hiatal hernia. Stable thickening of the left greater than right adrenal glands without discrete adrenal nodules. Musculoskeletal: No aggressive appearing focal osseous lesions. Stable chronic severe T6 vertebral compression fracture. Marked thoracic spondylosis. Partially visualized right shoulder arthroplasty. Stable subcutaneous upper right back 1.8 cm sebaceous cyst. IMPRESSION: 1. Stable chronic postinfectious/postinflammatory scarring in the dependent right upper lobe and dependent bilateral lower lobes. No superimposed acute consolidative airspace disease. 2. Sub solid 4 mm right middle lobe pulmonary nodule, stable since 10/24/2017 chest CT, probably benign. No follow-up recommended. This recommendation follows the consensus statement: Guidelines for Management of Incidental Pulmonary Nodules Detected on CT Images: From the Fleischner Society 2017; Radiology 2017; 284:228-243. 3. Stable prominently dilated main pulmonary artery, suggesting chronic pulmonary arterial hypertension. 4. Left main and 3 vessel  coronary atherosclerosis. 5. Small hiatal hernia. Aortic Atherosclerosis (ICD10-I70.0). Electronically Signed   By: Ilona Sorrel M.D.   On: 03/23/2018 15:36   Ct Angio Chest Pe W Or Wo Contrast  Result Date: 04/17/2018 CLINICAL DATA:  77 year old female admitted with bilateral  lower extremity pain and cramping. Mildly elevated troponin. Wheezing. EXAM: CT ANGIOGRAPHY CHEST WITH CONTRAST TECHNIQUE: Multidetector CT imaging of the chest was performed using the standard protocol during bolus administration of intravenous contrast. Multiplanar CT image reconstructions and MIPs were obtained to evaluate the vascular anatomy. CONTRAST:  77mL ISOVUE-370 IOPAMIDOL (ISOVUE-370) INJECTION 76% COMPARISON:  March 23, 2018 chest CT.  Chest x-ray Apr 17, 2018. FINDINGS: Cardiovascular: Atherosclerotic changes are seen in the thoracic aorta without aneurysm or dissection. Cardiomegaly is noted. Coronary artery calcifications are identified. The main pulmonary artery measures 4.3 cm which is dilated. Evaluation of pulmonary arteries is somewhat limited due to respiratory motion. Within this limitation, no emboli identified. Mediastinum/Nodes: A sebaceous cyst is seen over the right posterior shoulder, unchanged. The thyroid and esophagus are normal. Small pleural effusions. No pericardial effusion. No adenopathy. Lungs/Pleura: Small bilateral pleural effusions with underlying atelectasis. No suspicious infiltrate. Suspected mild edema with interlobular septal thickening in the apices. No suspicious nodules or masses. Upper Abdomen: No acute abnormality. Musculoskeletal: There is a compression fracture of an upper thoracic vertebral body, unchanged since March 23, 2018. Review of the MIP images confirms the above findings. IMPRESSION: 1. No pulmonary emboli identified. 2. Cardiomegaly, small effusions, and mild edema. 3. Atherosclerotic changes in the nonaneurysmal aorta. Coronary artery calcifications. 4. Dilatation of the main pulmonary artery measuring 4.3 cm. This is suggestive of pulmonary arterial hypertension. 5. Stable compression fracture of an upper thoracic vertebral body, unchanged since March 23, 2018. Aortic Atherosclerosis (ICD10-I70.0). Electronically Signed   By: Dorise Bullion III M.D    On: 04/17/2018 10:07   Dg Chest Port 1 View  Result Date: 04/17/2018 CLINICAL DATA:  Follow-up pneumonia EXAM: PORTABLE CHEST 1 VIEW COMPARISON:  04/15/2018 FINDINGS: Cardiac shadow is stable. Left jugular central line is again seen. Lungs are well aerated bilaterally. Persistent changes in the left base are seen. No new focal infiltrate is noted. Postsurgical changes in the right shoulder are seen. IMPRESSION: Stable left basilar changes.  No acute abnormality noted. Electronically Signed   By: Inez Catalina M.D.   On: 04/17/2018 07:17   Dg Chest Port 1 View  Result Date: 04/15/2018 CLINICAL DATA:  Central line placement. EXAM: PORTABLE CHEST 1 VIEW COMPARISON:  Radiograph of Apr 15, 2018. FINDINGS: Stable cardiomediastinal silhouette. Atherosclerosis of thoracic aorta is noted. Interval placement of left internal jugular catheter with distal tip in expected position of the SVC. No pneumothorax is noted. Right lung is clear. Mild left basilar subsegmental atelectasis or scarring is noted with possible minimal left pleural effusion. Bony thorax is unremarkable. IMPRESSION: Interval placement of left internal jugular catheter with distal tip in expected position of the SVC. No pneumothorax is noted. Mild left basilar subsegmental atelectasis or scarring is noted with possible minimal left pleural effusion. Electronically Signed   By: Marijo Conception, M.D.   On: 04/15/2018 20:23   Dg Chest Port 1 View  Result Date: 04/15/2018 CLINICAL DATA:  77 year old female with cough. EXAM: PORTABLE CHEST 1 VIEW COMPARISON:  Chest CT dated 03/23/2018 FINDINGS: There is no focal consolidation, pleural effusion, or pneumothorax. The cardiac silhouette is within normal limits. Atherosclerotic calcification of the aorta. Old healed left humeral neck fracture and right shoulder  hemiarthroplasty. No acute osseous pathology. IMPRESSION: No active disease. Electronically Signed   By: Anner Crete M.D.   On: 04/15/2018 06:00      Assessment and Recommendation  77 y.o. female with known cardiovascular disease essential hypertension mixed hyperlipidemia diabetes having acute sepsis and non-ST elevation myocardial infarction with probable cause of infarction due to stress-induced ischemia.  No further significant symptoms of heart failure angina or EKG changes or hemodynamic instability 1.  Continue supportive care of sepsis and infection 2.  Begin ambulation and follow for improvements of symptoms 3.  No further cardiac diagnostics necessary at this time 4.  We will further evaluate the possibility of further intervention from myocardial infarction depending on ability for medication management and ambulation without symptoms  Signed, Serafina Royals M.D. FACC

## 2018-04-20 NOTE — Progress Notes (Signed)
Room assignment received.  Report called to receiving RN, Brittney with no further questions.  Patient updated.
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
## 2018-04-20 NOTE — Clinical Social Work Note (Signed)
CSW met with patient to discuss discharge planning. Patient has changed her mind about SNF and would like to go to [facility] now. [facility] had originally offered patient a bed but she declined. CSW sent referral again over the HUB to [facility]. CSW also spoke to Gr[place] at [facility] and notified him that patient is interested in a bed there and that information was resent. CSW contacted Otila Kluver with [facility] [phone] to notify her of change of SNF at discharge. Rick at [facility] has called back and stated that they can accept patient at discharge. CSW will continue to follow for discharge needs.    Annamaria Boots MSW, [place]  [phone]

## 2018-04-20 NOTE — Progress Notes (Signed)
Follow up - Critical Care Medicine Note  Patient Details:    Sara Pierce is an 77 y.o. female.with a past medical history remarkable for arthritis, cardiomyopathy, chronic renal insufficiency, COPD, depression, diabetes, hypertension, obesity, sleep apnea admitted for an STEMI, hypotension. Echocardiogram reveals EF of 55-65%.  Lines, Airways, Drains: CVC Triple Lumen 04/15/18 Left Internal jugular (Active)  Indication for Insertion or Continuance of Line Vasoactive infusions 04/17/2018  8:00 AM  Site Assessment Clean;Dry;Intact 04/16/2018  8:00 PM  Proximal Lumen Status Infusing 04/16/2018  8:00 PM  Medial Lumen Status Infusing 04/16/2018  8:00 PM  Distal Lumen Status Infusing 04/16/2018  8:00 PM  Dressing Type Transparent;Occlusive 04/16/2018  8:00 PM  Dressing Status Clean;Dry;Intact;Antimicrobial disc in place 04/16/2018  8:00 PM  Line Care Connections checked and tightened 04/16/2018  8:00 PM  Dressing Change Due 04/22/18 04/16/2018  8:00 PM     External Urinary Catheter (Active)  Collection Container Dedicated Suction Canister 04/17/2018  4:00 AM  Securement Method Other (Comment) 04/15/2018  5:33 PM  Intervention Equipment Changed 04/17/2018  5:00 AM  Output (mL) 300 mL 04/17/2018  5:00 AM    Anti-infectives:  Anti-infectives (From admission, onward)   Start     Dose/Rate Route Frequency Ordered Stop   04/18/18 0800  vancomycin (VANCOCIN) IVPB 1000 mg/200 mL premix     1,000 mg 200 mL/hr over 60 Minutes Intravenous Every 12 hours 04/17/18 2215     04/17/18 2230  vancomycin (VANCOCIN) 1,500 mg in sodium chloride 0.9 % 500 mL IVPB     1,500 mg 250 mL/hr over 120 Minutes Intravenous  Once 04/17/18 2215 04/18/18 0150   04/17/18 2215  ceFEPIme (MAXIPIME) 2 g in sodium chloride 0.9 % 100 mL IVPB     2 g 200 mL/hr over 30 Minutes Intravenous Every 12 hours 04/17/18 2213     04/17/18 2215  vancomycin (VANCOCIN) IVPB 1000 mg/200 mL premix  Status:  Discontinued     1,000 mg 200 mL/hr over 60 Minutes  Intravenous  Once 04/17/18 2213 04/17/18 2215   04/17/18 1400  ceFAZolin (ANCEF) IVPB 1 g/50 mL premix  Status:  Discontinued     1 g 100 mL/hr over 30 Minutes Intravenous Every 8 hours 04/17/18 1012 04/17/18 1111   04/17/18 1115  ceFAZolin (ANCEF) IVPB 1 g/50 mL premix  Status:  Discontinued     1 g 100 mL/hr over 30 Minutes Intravenous Every 12 hours 04/17/18 1111 04/17/18 2204   04/16/18 1545  piperacillin-tazobactam (ZOSYN) IVPB 3.375 g  Status:  Discontinued     3.375 g 12.5 mL/hr over 240 Minutes Intravenous Every 8 hours 04/16/18 1539 04/17/18 1012   04/15/18 1900  cefTRIAXone (ROCEPHIN) 1 g in sodium chloride 0.9 % 100 mL IVPB  Status:  Discontinued     1 g 200 mL/hr over 30 Minutes Intravenous Every 24 hours 04/15/18 1838 04/16/18 1539   04/15/18 1100  cefTRIAXone (ROCEPHIN) 1 g in sodium chloride 0.9 % 100 mL IVPB  Status:  Discontinued     1 g 200 mL/hr over 30 Minutes Intravenous  Once 04/15/18 1013 04/15/18 1046   04/15/18 0615  cefTRIAXone (ROCEPHIN) 1 g in sodium chloride 0.9 % 100 mL IVPB     1 g 200 mL/hr over 30 Minutes Intravenous  Once 04/15/18 0602 04/15/18 3825      Microbiology: Results for orders placed or performed during the hospital encounter of 04/15/18  Urine culture     Status: Abnormal   Collection Time: 04/15/18  5:06 AM  Result Value Ref Range Status   Specimen Description   Final    URINE, CATHETERIZED Performed at [facility], [address] [place], [place] [address]    Special Requests   Final    NONE Performed at [facility], [address] [place],  [address]    Culture >=100,000 COLONIES/mL PROTEUS MIRABILIS (A)  Final   Report Status 04/17/2018 FINAL  Final   Organism ID, Bacteria PROTEUS MIRABILIS (A)  Final      Susceptibility   Proteus mirabilis - MIC*    AMPICILLIN <=2 SENSITIVE Sensitive     CEFAZOLIN <=4 SENSITIVE Sensitive     CEFTRIAXONE <=1 SENSITIVE Sensitive     CIPROFLOXACIN >=4 RESISTANT  Resistant     GENTAMICIN <=1 SENSITIVE Sensitive     IMIPENEM 1 SENSITIVE Sensitive     NITROFURANTOIN 128 RESISTANT Resistant     TRIMETH/SULFA >=320 RESISTANT Resistant     AMPICILLIN/SULBACTAM <=2 SENSITIVE Sensitive     PIP/TAZO <=4 SENSITIVE Sensitive     * >=100,000 COLONIES/mL PROTEUS MIRABILIS  MRSA PCR Screening     Status: None   Collection Time: 04/15/18  3:25 PM  Result Value Ref Range Status   MRSA by PCR NEGATIVE NEGATIVE Final    Comment:        The GeneXpert MRSA Assay (FDA approved for NASAL specimens only), is one component of a comprehensive MRSA colonization surveillance program. It is not intended to diagnose MRSA infection nor to guide or monitor treatment for MRSA infections. Performed at [facility], [address] [place],  [address]     Studies: Dg Chest 2 View  Result Date: 03/23/2018 CLINICAL DATA:  Congestion, productive cough, pneumonia, shortness of breath, chest pain, history CHF, COPD, cardiomyopathy EXAM: CHEST - 2 VIEW COMPARISON:  01/05/2018 FINDINGS: Slightly rotated to the LEFT. Borderline enlargement of cardiac silhouette. Atherosclerotic calcification aorta. Enlarged central pulmonary arteries question pulmonary arterial hypertension. Bibasilar and RIGHT mid lung atelectasis. No definite acute infiltrate, pleural effusion or pneumothorax. Abnormal RIGHT paramediastinal density superior to the RIGHT hilum at the level of the aortic arch is likely related to the marked compression fracture of the T6 vertebral body with paraspinal calcific debris and spurring as noted on prior CT exams, accentuated by rotation to the LEFT. Bones demineralized with note of a RIGHT shoulder prosthesis. IMPRESSION: Bibasilar atelectasis. No new abnormalities. Electronically Signed   By: Lavonia Dana M.D.   On: 03/23/2018 12:37   Ct Chest Wo Contrast  Result Date: 03/23/2018 CLINICAL DATA:  COPD exacerbation.  Dyspnea.  Productive cough. EXAM: CT  CHEST WITHOUT CONTRAST TECHNIQUE: Multidetector CT imaging of the chest was performed following the standard protocol without IV contrast. COMPARISON:  Chest radiograph from earlier today. 11/26/2017 chest CT. FINDINGS: Cardiovascular: Normal heart size. No significant pericardial fluid/thickening. Left main and 3 vessel coronary atherosclerosis. Atherosclerotic nonaneurysmal thoracic aorta. Stable prominently dilated main pulmonary artery (4.2 cm diameter). Mediastinum/Nodes: Hypodense 1.0 cm left thyroid lobe nodule. Unremarkable esophagus. No pathologically enlarged axillary, mediastinal or gross hilar lymph nodes, noting limited sensitivity for the detection of hilar adenopathy on this noncontrast study. Lungs/Pleura: No pneumothorax. No pleural effusion. Chronic bandlike opacities in the dependent right upper lobe and dependent/medial bilateral lower lobes with associated volume loss, compatible with chronic postinfectious/postinflammatory scarring. No acute consolidative airspace disease or lung masses. Sub solid 4 mm right middle lobe pulmonary nodule (series 3/image 80) is stable since 10/24/2017 chest CT. No new significant pulmonary  nodules. Upper abdomen: Small hiatal hernia. Stable thickening of the left greater than right adrenal glands without discrete adrenal nodules. Musculoskeletal: No aggressive appearing focal osseous lesions. Stable chronic severe T6 vertebral compression fracture. Marked thoracic spondylosis. Partially visualized right shoulder arthroplasty. Stable subcutaneous upper right back 1.8 cm sebaceous cyst. IMPRESSION: 1. Stable chronic postinfectious/postinflammatory scarring in the dependent right upper lobe and dependent bilateral lower lobes. No superimposed acute consolidative airspace disease. 2. Sub solid 4 mm right middle lobe pulmonary nodule, stable since 10/24/2017 chest CT, probably benign. No follow-up recommended. This recommendation follows the consensus statement:  Guidelines for Management of Incidental Pulmonary Nodules Detected on CT Images: From the Fleischner Society 2017; Radiology 2017; 284:228-243. 3. Stable prominently dilated main pulmonary artery, suggesting chronic pulmonary arterial hypertension. 4. Left main and 3 vessel coronary atherosclerosis. 5. Small hiatal hernia. Aortic Atherosclerosis (ICD10-I70.0). Electronically Signed   By: Ilona Sorrel M.D.   On: 03/23/2018 15:36   Ct Angio Chest Pe W Or Wo Contrast  Result Date: 04/17/2018 CLINICAL DATA:  77 year old female admitted with bilateral lower extremity pain and cramping. Mildly elevated troponin. Wheezing. EXAM: CT ANGIOGRAPHY CHEST WITH CONTRAST TECHNIQUE: Multidetector CT imaging of the chest was performed using the standard protocol during bolus administration of intravenous contrast. Multiplanar CT image reconstructions and MIPs were obtained to evaluate the vascular anatomy. CONTRAST:  77mL ISOVUE-370 IOPAMIDOL (ISOVUE-370) INJECTION 76% COMPARISON:  March 23, 2018 chest CT.  Chest x-ray Apr 17, 2018. FINDINGS: Cardiovascular: Atherosclerotic changes are seen in the thoracic aorta without aneurysm or dissection. Cardiomegaly is noted. Coronary artery calcifications are identified. The main pulmonary artery measures 4.3 cm which is dilated. Evaluation of pulmonary arteries is somewhat limited due to respiratory motion. Within this limitation, no emboli identified. Mediastinum/Nodes: A sebaceous cyst is seen over the right posterior shoulder, unchanged. The thyroid and esophagus are normal. Small pleural effusions. No pericardial effusion. No adenopathy. Lungs/Pleura: Small bilateral pleural effusions with underlying atelectasis. No suspicious infiltrate. Suspected mild edema with interlobular septal thickening in the apices. No suspicious nodules or masses. Upper Abdomen: No acute abnormality. Musculoskeletal: There is a compression fracture of an upper thoracic vertebral body, unchanged since March 23, 2018. Review of the MIP images confirms the above findings. IMPRESSION: 1. No pulmonary emboli identified. 2. Cardiomegaly, small effusions, and mild edema. 3. Atherosclerotic changes in the nonaneurysmal aorta. Coronary artery calcifications. 4. Dilatation of the main pulmonary artery measuring 4.3 cm. This is suggestive of pulmonary arterial hypertension. 5. Stable compression fracture of an upper thoracic vertebral body, unchanged since March 23, 2018. Aortic Atherosclerosis (ICD10-I70.0). Electronically Signed   By: Dorise Bullion III M.D   On: 04/17/2018 10:07   Dg Chest Port 1 View  Result Date: 04/17/2018 CLINICAL DATA:  Follow-up pneumonia EXAM: PORTABLE CHEST 1 VIEW COMPARISON:  04/15/2018 FINDINGS: Cardiac shadow is stable. Left jugular central line is again seen. Lungs are well aerated bilaterally. Persistent changes in the left base are seen. No new focal infiltrate is noted. Postsurgical changes in the right shoulder are seen. IMPRESSION: Stable left basilar changes.  No acute abnormality noted. Electronically Signed   By: Inez Catalina M.D.   On: 04/17/2018 07:17   Dg Chest Port 1 View  Result Date: 04/15/2018 CLINICAL DATA:  Central line placement. EXAM: PORTABLE CHEST 1 VIEW COMPARISON:  Radiograph of Apr 15, 2018. FINDINGS: Stable cardiomediastinal silhouette. Atherosclerosis of thoracic aorta is noted. Interval placement of left internal jugular catheter with distal tip in expected position of the SVC. No  pneumothorax is noted. Right lung is clear. Mild left basilar subsegmental atelectasis or scarring is noted with possible minimal left pleural effusion. Bony thorax is unremarkable. IMPRESSION: Interval placement of left internal jugular catheter with distal tip in expected position of the SVC. No pneumothorax is noted. Mild left basilar subsegmental atelectasis or scarring is noted with possible minimal left pleural effusion. Electronically Signed   By: Marijo Conception, M.D.   On:  04/15/2018 20:23   Dg Chest Port 1 View  Result Date: 04/15/2018 CLINICAL DATA:  77 year old female with cough. EXAM: PORTABLE CHEST 1 VIEW COMPARISON:  Chest CT dated 03/23/2018 FINDINGS: There is no focal consolidation, pleural effusion, or pneumothorax. The cardiac silhouette is within normal limits. Atherosclerotic calcification of the aorta. Old healed left humeral neck fracture and right shoulder hemiarthroplasty. No acute osseous pathology. IMPRESSION: No active disease. Electronically Signed   By: Anner Crete M.D.   On: 04/15/2018 06:00    Consults: Treatment Team:  Nicholes Mango, MD Teodoro Spray, MD Cassandria Santee, MD   Subjective:    Overnight Issues:patient improved. Patient has been weaned off of pressors, leg pain has significantly improved per patient. Pending cardiology for cardiac catheterization.  Objective:  Vital signs for last 24 hours: Temp:  [96.5 F (35.8 C)-98.5 F (36.9 C)] 97.7 F (36.5 C) (05/09 0200) Pulse Rate:  [74-98] 81 (05/09 0700) Resp:  [11-19] 15 (05/09 0700) BP: (78-171)/(49-139) 100/86 (05/09 0700) SpO2:  [91 %-100 %] 95 % (05/09 0700) Weight:  [242 lb 8.1 oz (110 kg)] 242 lb 8.1 oz (110 kg) (05/09 0500)  Hemodynamic parameters for last 24 hours:    Intake/Output from previous day: 05/08 0701 - 05/09 0700 In: 1110.5 [P.O.:720; I.V.:390.5] Out: 865 [Urine:865]  Intake/Output this shift: No intake/output data recorded.  Vent settings for last 24 hours:    Physical Exam:   Vital signs: Please see the above listed vital signs HEENT: A shin has a left internal jugular central line, trachea is midline, no jugular venous distention is noted, no oral lesions appreciated, minimal increased work of breathing Cardiovascular: Regular rate and rhythm Abdominal: Positive bowel sounds, soft exam Extremities: No lower extremity edema appreciated   Assessment/Plan:   Patient with NSTEMI. Mildly elevated troponin. CT scan did not reveal  pulmonary embolism. 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 Pending cardiac catheterization.  Shock/Leukocytosis.  Patient is presently on cefepime and vancomycin. Urinalysis shows Proteus. Will complete a seven-day course for empiric septic shock. This is significantly improved after broadening antibiotics, pressors are now weaned off  Anemia. No evidence of active bleedingArea 8 H&H is stable  Hyponatremia. Will replace with saline  Hyperglycemia. Blood sugars are under control  At this point patient is stable for telemetry transfer  [place] 04/20/2018  *Care during the described time interval was provided by me and/or other providers on the critical care team.  I have reviewed this patient's available data, including medical history, events of note, physical examination and test results as part of my evaluation. Patient ID: Sara Pierce, female   DOB: 03-03-1941, 77 y.o.   MRN: [id number] Patient ID: Sara Pierce, female   DOB: [birth year], 77 y.o.   MRN: [id number] Patient ID: Sara Pierce, female   DOB: May 07, 1941, 77 y.o.   MRN: [id number]

## 2018-04-20 NOTE — Care Management (Signed)
Sara Pierce with [facility] called this RNCM from [phone] requesting update. I notified Otila Kluver that per CSW note 04/16/18, plan was for patient to go to [facility] at discharge.

## 2018-04-20 NOTE — Progress Notes (Signed)
Pharmacy Electrolyte Monitoring Consult:  Pharmacy consulted to assist in monitoring and replacing electrolytes in this 77 y.o. female admitted on 04/15/2018 with NSTEMI.   Patient is receiving NS @ 37mL/hr.   Labs:  Sodium (mmol/L)  Date Value  04/20/2018 131 (L)  01/14/2015 137   Potassium (mmol/L)  Date Value  04/20/2018 4.0  01/14/2015 4.4   Magnesium (mg/dL)  Date Value  04/18/2018 1.9  05/05/2014 2.3   Phosphorus (mg/dL)  Date Value  04/17/2018 3.3   Calcium (mg/dL)  Date Value  04/20/2018 9.3   Calcium, Total (mg/dL)  Date Value  01/14/2015 9.1   Albumin (g/dL)  Date Value  03/23/2018 4.0  10/05/2014 3.4    Plan:  Continue NS @ 25mL/hr.   Will obtain electrolyte with am labs.   Pharmacy will continue to monitor and adjust per consult.   Osvaldo Lamping L 04/20/2018 11:56 AM

## 2018-04-20 NOTE — Progress Notes (Signed)
[place] for heparin drip management  Indication: NSTEMI  Allergies  Allergen Reactions  . Aspirin Swelling  . Codeine Itching  . Naprosyn [Naproxen] Other (See Comments)    Per MAR pt allergic  . Other     Elastic Bandages/ Supports Per MAR  . Tape Hives  . Valacyclovir     Patient Measurements: Height: 5\' 5"  (165.1 cm) Weight: 242 lb 8.1 oz (110 kg) IBW/kg (Calculated) : 57 Heparin Dosing Weight: 79 kg   Vital Signs: Temp: 97.5 F (36.4 C) (05/08 2000) Temp Source: Axillary (05/08 2000) BP: 87/56 (05/08 2030) Pulse Rate: 89 (05/08 2030)  Labs: Recent Labs    04/17/18 0438 04/18/18 0552 04/19/18 0441 04/19/18 1428 04/19/18 2146  HGB 10.4* 9.6* 9.0*  --   --   HCT 30.9* 28.9* 26.8*  --   --   PLT 249 232 226  --   --   HEPARINUNFRC 0.38 0.39 0.25* 0.46 0.42  CREATININE 0.78 0.55  --   --   --     Estimated Creatinine Clearance: 76.2 mL/min (by C-G formula based on SCr of 0.55 mg/dL).   Medical History: Past Medical History:  Diagnosis Date  . Arthritis   . Cardiomyopathy ([place])   . Cataract   . Cerebral hemorrhage ([place])   . CHF (congestive heart failure) ([place])   . Chronic kidney disease   . Clotting disorder ([place])   . COPD (chronic obstructive pulmonary disease) ([place])   . Depression   . Diabetes mellitus without complication ([place])   . Hypertension   . Mixed incontinence   . Obesity   . Sleep apnea   . Thyroid disease   . Urinary frequency   . Vaginal atrophy   . Varicose veins     Medications:  Scheduled:  . ARIPiprazole  5 mg Oral QHS  . atorvastatin  40 mg Oral q1800  . budesonide  0.5 mg Nebulization BID  . buPROPion  150 mg Oral Daily  . chlorhexidine  15 mL Mouth Rinse BID  . clopidogrel  75 mg Oral Daily  . guaiFENesin  600 mg Oral BID   And  . dextromethorphan  30 mg Oral BID  . docusate sodium  100 mg Oral BID  . FLUoxetine  80 mg Oral Daily  . gabapentin  400 mg Oral TID  .  hydrocortisone sod succinate (SOLU-CORTEF) inj  100 mg Intravenous Q8H  . insulin aspart  0-15 Units Subcutaneous TID WC  . insulin aspart  0-5 Units Subcutaneous QHS  . linagliptin  5 mg Oral Daily  . liothyronine  25 mcg Oral Daily  . mirtazapine  7.5 mg Oral QHS  . pantoprazole  40 mg Oral Daily  . sodium chloride flush  10-40 mL Intracatheter Q12H  . sodium chloride flush  3 mL Intravenous Q12H  . sodium chloride flush  3 mL Intravenous Q12H  . [START ON 04/23/2018] Vitamin D (Ergocalciferol)  50,000 Units Oral Q30 days   Infusions:  . sodium chloride Stopped (04/17/18 1530)  . ceFEPime (MAXIPIME) IV 2 g (04/19/18 2128)  . heparin 1,450 Units/hr (04/19/18 1800)  . norepinephrine (LEVOPHED) Adult infusion Stopped (04/19/18 1500)  . phenylephrine (NEO-SYNEPHRINE) Adult infusion Stopped (04/18/18 0412)  . sodium chloride    . vancomycin 1,000 mg (04/19/18 2128)  . vasopressin (PITRESSIN) infusion - *FOR SHOCK* Stopped (04/19/18 0930)    Assessment: Pharmacy consulted for heparin drip management for 77 yo female admitted with NSTEMI. Patient  for CT on 5/6 with no PE identified. Patient currently receiving heparin at 1300 units/hr.   Goal of Therapy:  Heparin level 0.3-0.7 units/ml Monitor platelets by anticoagulation protocol: Yes   Plan:  05/08 @ 2146 HL 0.42 therapeutic. Will continue @ 1450 units/hr and will recheck next anti-Xa w/ am labs. Will monitor CBC w/ am labs.  Tobie Lords, PharmD, BCPS Clinical Pharmacist 04/20/2018

## 2018-04-21 ENCOUNTER — Inpatient Hospital Stay: Payer: Medicare Other

## 2018-04-21 DIAGNOSIS — G253 Myoclonus: Secondary | ICD-10-CM

## 2018-04-21 LAB — CBC
HEMATOCRIT: 25.1 % — AB (ref 35.0–47.0)
HEMOGLOBIN: 8.5 g/dL — AB (ref 12.0–16.0)
MCH: 32.6 pg (ref 26.0–34.0)
MCHC: 34 g/dL (ref 32.0–36.0)
MCV: 95.8 fL (ref 80.0–100.0)
Platelets: 205 10*3/uL (ref 150–440)
RBC: 2.62 MIL/uL — ABNORMAL LOW (ref 3.80–5.20)
RDW: 16.8 % — AB (ref 11.5–14.5)
WBC: 9.2 10*3/uL (ref 3.6–11.0)

## 2018-04-21 LAB — BASIC METABOLIC PANEL
ANION GAP: 4 — AB (ref 5–15)
BUN: 21 mg/dL — AB (ref 6–20)
CHLORIDE: 105 mmol/L (ref 101–111)
CO2: 27 mmol/L (ref 22–32)
Calcium: 9.3 mg/dL (ref 8.9–10.3)
Creatinine, Ser: 0.7 mg/dL (ref 0.44–1.00)
GFR calc Af Amer: >60 mL/min (ref >60)
GFR calc non Af Amer: >60 mL/min (ref >60)
GLUCOSE: 105 mg/dL — AB (ref 65–99)
POTASSIUM: 4.2 mmol/L (ref 3.5–5.1)
Sodium: 136 mmol/L (ref 135–145)

## 2018-04-21 LAB — HEPARIN LEVEL (UNFRACTIONATED): Heparin Unfractionated: 0.61 IU/mL (ref 0.30–0.70)

## 2018-04-21 LAB — GLUCOSE, CAPILLARY
Glucose-Capillary: 112 mg/dL — ABNORMAL HIGH (ref 65–99)
Glucose-Capillary: 97 mg/dL (ref 65–99)
Glucose-Capillary: 112 mg/dL — ABNORMAL HIGH (ref 65–99)
Glucose-Capillary: 111 mg/dL — ABNORMAL HIGH (ref 65–99)

## 2018-04-21 MED ORDER — IPRATROPIUM-ALBUTEROL 0.5-2.5 (3) MG/3ML IN SOLN
3.0000 mL | Freq: Four times a day (QID) | RESPIRATORY_TRACT | Status: DC
  Administered 2018-04-21 – 2018-04-22 (×4): 3 mL via RESPIRATORY_TRACT
  Filled 2018-04-21 (×4): qty 3

## 2018-04-21 MED ORDER — HYDROCOD POLST-CPM POLST ER 10-8 MG/5ML PO SUER
5.0000 mL | Freq: Two times a day (BID) | ORAL | Status: DC
  Administered 2018-04-21 – 2018-04-22 (×3): 5 mL via ORAL
  Filled 2018-04-21 (×3): qty 5

## 2018-04-21 MED ORDER — ACETYLCYSTEINE 20 % IN SOLN
3.0000 mL | Freq: Two times a day (BID) | RESPIRATORY_TRACT | Status: DC
  Administered 2018-04-21: 4 mL via ORAL
  Administered 2018-04-22: 3 mL via ORAL
  Filled 2018-04-21 (×3): qty 4

## 2018-04-21 MED ORDER — ACETYLCYSTEINE 20 % IN SOLN
600.0000 mg | Freq: Two times a day (BID) | RESPIRATORY_TRACT | Status: DC
  Filled 2018-04-21 (×2): qty 4

## 2018-04-21 MED ORDER — FUROSEMIDE 10 MG/ML IJ SOLN
20.0000 mg | Freq: Once | INTRAMUSCULAR | Status: AC
  Administered 2018-04-21: 20 mg via INTRAVENOUS
  Filled 2018-04-21: qty 2

## 2018-04-21 MED ORDER — FUROSEMIDE 20 MG PO TABS
20.0000 mg | ORAL_TABLET | Freq: Two times a day (BID) | ORAL | Status: DC
  Administered 2018-04-22: 20 mg via ORAL
  Filled 2018-04-21: qty 1

## 2018-04-21 MED ORDER — ACETYLCYSTEINE 20 % IN SOLN
3.0000 mL | Freq: Two times a day (BID) | RESPIRATORY_TRACT | Status: DC
Start: 2018-04-22 — End: 2018-04-21

## 2018-04-21 MED ORDER — GABAPENTIN 100 MG PO CAPS
200.0000 mg | ORAL_CAPSULE | Freq: Three times a day (TID) | ORAL | Status: DC
  Administered 2018-04-21 – 2018-04-22 (×4): 200 mg via ORAL
  Filled 2018-04-21 (×4): qty 2

## 2018-04-21 MED ORDER — HYDROCORTISONE NA SUCCINATE PF 100 MG IJ SOLR
12.5000 mg | Freq: Two times a day (BID) | INTRAMUSCULAR | Status: DC
  Filled 2018-04-21: qty 0.25

## 2018-04-21 MED ORDER — FUROSEMIDE 20 MG PO TABS: 20.0000 mg | ORAL_TABLET | Freq: Two times a day (BID) | ORAL | Status: DC

## 2018-04-21 NOTE — Progress Notes (Signed)
Pharmacy Antibiotic Note  Sara FOLSON is a 77 y.o. female admitted on 04/15/2018. Patient admitted with NSTEMI. Pharmacy has been consulted for cefepime and vancomycin dosing for sepsis.   Plan: Continue cefepime 2g IV Q12hr   Continue vancomycin 1000 mg iv q 18 hours with trough tomorrow. This should be the last trough needed with therapy scheduled to end 5/13.  Per ICU rounds on 5/8, will treat for 8 days.    Height: 5\' 5"  (165.1 cm) Weight: 238 lb 14.4 oz (108.4 kg) IBW/kg (Calculated) : 57  Temp (24hrs), Avg:98 F (36.7 C), Min:97.5 F (36.4 C), Max:98.3 F (36.8 C)  Recent Labs  Lab 04/16/18 0517 04/17/18 0438 04/18/18 0552 04/19/18 0441 04/20/18 0446 04/20/18 0817 04/21/18 0528  WBC 14.7* 14.3* 15.2* 11.0 5.3  --  9.2  CREATININE 0.79 0.78 0.55  --  0.60  --  0.70  VANCOTROUGH  --   --   --   --   --  25*  --     Estimated Creatinine Clearance: 75.6 mL/min (by C-G formula based on SCr of 0.7 mg/dL).    Allergies  Allergen Reactions  . Aspirin Swelling  . Codeine Itching  . Naprosyn [Naproxen] Other (See Comments)    Per MAR pt allergic  . Other     Elastic Bandages/ Supports Per MAR  . Tape Hives  . Valacyclovir     Antimicrobials this admission: Ceftriaxone 5/4 >> 5/5 Zosyn 5/5 >> 5/6  Cefazolin 5/6 >> 5/6 Cefepime 5/6 >> 5/13 Vancomycin 5/6 >> 5/13  Dose adjustments this admission: 5/9 Vancomycin transitioned to 1000mg  IV Q18hr.   Microbiology results: 5/4  UCx: > 100K Proteus Mirabilis Sensitive to Cefazolin  5/4 MRSA PCR: negative   Thank you for allowing pharmacy to be a part of this patient's care.  Ulice Dash D 04/21/2018 8:08 AM

## 2018-04-21 NOTE — Consult Note (Signed)
Reason for Consult:Jerking movements Referring Physician: Gouru  CC: Jerking  HPI: Sara Pierce is an 76 y.o. female who reports that she has had a worsening respiratory status for the past 6 months.  Has noticed a tremor during that period of time.  Was started on Neurontin about a month ago for her continuous toe movement and neuropathy.  After start of Neurontin her symptoms seemed to worsen from being a tremor to her extremities flinging and her being unable to hold onto objects.  TSH normal.    Past Medical History:  Diagnosis Date  . Arthritis   . Cardiomyopathy ([place])   . Cataract   . Cerebral hemorrhage ([place])   . CHF (congestive heart failure) ([place])   . Chronic kidney disease   . Clotting disorder ([place])   . COPD (chronic obstructive pulmonary disease) ([place])   . Depression   . Diabetes mellitus without complication ([place])   . Hypertension   . Mixed incontinence   . Obesity   . Sleep apnea   . Thyroid disease   . Urinary frequency   . Vaginal atrophy   . Varicose veins     Past Surgical History:  Procedure Laterality Date  . APPENDECTOMY    . Cardiac Catherization    . COLONOSCOPY WITH PROPOFOL N/A 04/28/2016   Procedure: COLONOSCOPY WITH PROPOFOL;  Surgeon: Manya Silvas, MD;  Location: [facility] ENDOSCOPY;  Service: Endoscopy;  Laterality: N/A;  . COLONOSCOPY WITH PROPOFOL N/A 04/29/2016   Procedure: COLONOSCOPY WITH PROPOFOL;  Surgeon: Manya Silvas, MD;  Location: [facility] ENDOSCOPY;  Service: Endoscopy;  Laterality: N/A;  . JOINT REPLACEMENT    . STRABISMUS SURGERY    . TONSILLECTOMY    . TUBAL LIGATION      Family History  Problem Relation Age of Onset  . Heart attack Father   . Coronary artery disease Father   . Hypertension Father   . Hyperlipidemia Father   . Heart attack Mother   . Hypertension Mother   . Hyperlipidemia Mother   . Breast cancer Neg Hx     Social History:  reports that she has quit smoking. Her smoking use included cigarettes. She has  a 22.00 pack-year smoking history. She has never used smokeless tobacco. She reports that she does not drink alcohol or use drugs.  Allergies  Allergen Reactions  . Aspirin Swelling  . Codeine Itching  . Naprosyn [Naproxen] Other (See Comments)    Per MAR pt allergic  . Other     Elastic Bandages/ Supports Per MAR  . Tape Hives  . Valacyclovir     Medications:  I have reviewed the patient's current medications. Prior to Admission:  Medications Prior to Admission  Medication Sig Dispense Refill Last Dose  . albuterol (PROVENTIL HFA;VENTOLIN HFA) 108 (90 Base) MCG/ACT inhaler Inhale 2 puffs into the lungs every 6 (six) hours as needed for wheezing or shortness of breath.   prn at prn  . ARIPiprazole (ABILIFY) 5 MG tablet Take 5 mg by mouth at bedtime.   Past Month at Unknown time  . budesonide (PULMICORT) 0.5 MG/2ML nebulizer solution Take 2 mLs (0.5 mg total) 2 (two) times daily by nebulization.  12 Past Month at Unknown time  . buPROPion (WELLBUTRIN XL) 150 MG 24 hr tablet Take 150 mg by mouth daily.   Past Month at Unknown time  . clonazePAM (KLONOPIN) 1 MG tablet Take 1 mg by mouth 2 (two) times daily.   Past Month at Unknown time  .  ergocalciferol (VITAMIN D2) 50000 units capsule Take 50,000 Units by mouth every 30 (thirty) days. Takes on the 12th   Past Month at Unknown time  . FLUoxetine (PROZAC) 40 MG capsule Take 80 mg by mouth daily.   Past Month at Unknown time  . furosemide (LASIX) 40 MG tablet Take 40 mg by mouth 2 (two) times daily.   Past Month at Unknown time  . gabapentin (NEURONTIN) 400 MG capsule Take 400 mg by mouth 3 (three) times daily.   1 Past Month at Unknown time  . glimepiride (AMARYL) 4 MG tablet Take 4 mg by mouth daily with breakfast.   Past Month at Unknown time  . hydrOXYzine (ATARAX/VISTARIL) 25 MG tablet Take 25 mg by mouth 2 (two) times daily as needed for anxiety.    prn at prn  . liothyronine (CYTOMEL) 25 MCG tablet Take 25 mcg by mouth daily.    Past  Month at Unknown time  . lisinopril (PRINIVIL,ZESTRIL) 10 MG tablet Take 10 mg by mouth daily.   Past Month at Unknown time  . metFORMIN (GLUCOPHAGE) 1000 MG tablet Take 1,000 mg by mouth 2 (two) times daily with a meal.   Past Month at Unknown time  . mirtazapine (REMERON) 15 MG tablet Take 7.5 mg by mouth at bedtime.   Past Month at Unknown time  . montelukast (SINGULAIR) 10 MG tablet Take 10 mg by mouth at bedtime.    Past Month at Unknown time  . naproxen (NAPROSYN) 500 MG tablet Take 500 mg by mouth 2 (two) times daily with a meal.   Past Month at Unknown time  . omeprazole (PRILOSEC) 20 MG capsule Take 20 mg by mouth daily.   Past Month at Unknown time  . potassium chloride (K-DUR) 10 MEQ tablet Take 10 mEq by mouth 2 (two) times daily.    Past Month at Unknown time  . simvastatin (ZOCOR) 40 MG tablet Take 40 mg by mouth at bedtime.    Past Month at Unknown time  . sitaGLIPtin (JANUVIA) 100 MG tablet Take 100 mg by mouth daily.   Past Month at Unknown time  . spironolactone (ALDACTONE) 25 MG tablet Take 25 mg by mouth daily.   Past Month at Unknown time  . tiZANidine (ZANAFLEX) 2 MG tablet Take 2 mg by mouth every 6 (six) hours as needed for muscle spasms.    prn at prn  . traMADol (ULTRAM) 50 MG tablet Take 50 mg by mouth every 6 (six) hours as needed for moderate pain.   prn at prn  . umeclidinium-vilanterol (ANORO ELLIPTA) 62.5-25 MCG/INH AEPB Inhale 1 puff into the lungs daily.   Past Month at Unknown time  . doxycycline (VIBRA-TABS) 100 MG tablet Take 1 tablet (100 mg total) by mouth every 12 (twelve) hours. (Patient not taking: Reported on 04/15/2018) 8 tablet 0 Completed Course at Unknown time  . ferrous sulfate 325 (65 FE) MG tablet Take 1 tablet (325 mg total) 2 (two) times daily with a meal by mouth. (Patient not taking: Reported on 04/15/2018)  3 Not Taking at Unknown time  . predniSONE (DELTASONE) 10 MG tablet Take 50 mg daily taper by 10 mg daily  Then stop (Patient not taking:  Reported on 04/15/2018) 15 tablet 0 Completed Course at Unknown time   Scheduled: . ARIPiprazole  5 mg Oral QHS  . atorvastatin  40 mg Oral q1800  . budesonide  0.5 mg Nebulization BID  . buPROPion  150 mg Oral Daily  .  chlorhexidine  15 mL Mouth Rinse BID  . clopidogrel  75 mg Oral Daily  . guaiFENesin  600 mg Oral BID   And  . dextromethorphan  30 mg Oral BID  . docusate sodium  100 mg Oral BID  . FLUoxetine  80 mg Oral Daily  . gabapentin  200 mg Oral TID  . hydrocortisone sod succinate (SOLU-CORTEF) inj  100 mg Intravenous Q8H  . insulin aspart  0-15 Units Subcutaneous TID WC  . insulin aspart  0-5 Units Subcutaneous QHS  . ipratropium-albuterol  3 mL Nebulization Q6H  . linagliptin  5 mg Oral Daily  . liothyronine  25 mcg Oral Daily  . mirtazapine  7.5 mg Oral QHS  . pantoprazole  40 mg Oral Daily  . sodium chloride flush  10-40 mL Intracatheter Q12H  . sodium chloride flush  3 mL Intravenous Q12H  . sodium chloride flush  3 mL Intravenous Q12H  . [START ON 04/23/2018] Vitamin D (Ergocalciferol)  50,000 Units Oral Q30 days    ROS: History obtained from the patient  General ROS: negative for - chills, fatigue, fever, night sweats, weight gain or weight loss Psychological ROS: negative for - behavioral disorder, hallucinations, memory difficulties, mood swings or suicidal ideation Ophthalmic ROS: negative for - blurry vision, double vision, eye pain or loss of vision ENT ROS: negative for - epistaxis, nasal discharge, oral lesions, sore throat, tinnitus or vertigo Allergy and Immunology ROS: negative for - hives or itchy/watery eyes Hematological and Lymphatic ROS: negative for - bleeding problems, bruising or swollen lymph nodes Endocrine ROS: negative for - galactorrhea, hair pattern changes, polydipsia/polyuria or temperature intolerance Respiratory ROS: shortness of breath, wheezing Cardiovascular ROS: negative for - chest pain, dyspnea on exertion, edema or irregular  heartbeat Gastrointestinal ROS: negative for - abdominal pain, diarrhea, hematemesis, nausea/vomiting or stool incontinence Genito-Urinary ROS: negative for - dysuria, hematuria, incontinence or urinary frequency/urgency Musculoskeletal ROS: left arm decreased ROM Neurological ROS: as noted in HPI Dermatological ROS: negative for rash and skin lesion changes  Physical Examination: Blood pressure (!) 91/49, pulse 72, temperature 97.8 F (36.6 C), temperature source Oral, resp. rate 18, height 5\' 5"  (1.651 m), weight 108.4 kg (238 lb 14.4 oz), SpO2 100 %.  HEENT-  Normocephalic, no lesions, without obvious abnormality.  Normal external eye and conjunctiva.  Normal TM's bilaterally.  Normal auditory canals and external ears. Normal external nose, mucus membranes and septum.  Normal pharynx. Cardiovascular- S1, S2 normal, pulses palpable throughout   Lungs- wheezes bilaterally Abdomen- soft, non-tender; bowel sounds normal; no masses,  no organomegaly Extremities- BLE edema Lymph-no adenopathy palpable Musculoskeletal-contractured LUE Skin-warm and dry, no hyperpigmentation, vitiligo, or suspicious lesions  Neurological Examination   Mental Status: Alert, oriented, thought content appropriate.  Speech fluent without evidence of aphasia.  Able to follow 3 step commands without difficulty. Cranial Nerves: II: Discs flat bilaterally; Visual fields grossly normal, pupils equal, round, reactive to light and accommodation III,IV, VI: ptosis not present, extra-ocular motions intact bilaterally V,VII: smile symmetric, facial light touch sensation normal bilaterally VIII: hearing normal bilaterally IX,X: gag reflex present XI: bilateral shoulder shrug XII: midline tongue extension Motor: LUE movement limited secondary to contracture but full strength within ROM.  5/5 RUE.  Moves all muscle groups in lower extremities but unable to maintain lifted off the bed.  Myoclonus noted in the upper  extremities Sensory: Pinprick and light touch intact throughout, bilaterally Deep Tendon Reflexes: 2+ in the upper extremities and absent in the lower extremities.  Plantars: Right: mute   Left: mute Cerebellar: Normal finger-to-nose testing Gait: patient reports being w/c bound    Laboratory Studies:   Basic Metabolic Panel: Recent Labs  Lab 04/15/18 1929 04/16/18 0517 04/17/18 0438 04/18/18 0552 04/20/18 0446 04/21/18 0528  NA 141 139 137 135 131* 136  K 3.6 3.9 4.2 3.8 4.0 4.2  CL 104 104 104 104 100* 105  CO2 30 26 26 25 27 27   GLUCOSE 127* 144* 149* 173* 139* 105*  BUN 28* 24* 19 14 17  21*  CREATININE 1.13* 0.79 0.78 0.55 0.60 0.70  CALCIUM 8.9 8.9 8.5* 8.8* 9.3 9.3  MG 2.1 1.9 1.8 1.9  --   --   PHOS 4.1 3.9 3.3  --   --   --     Liver Function Tests: No results for input(s): AST, ALT, ALKPHOS, BILITOT, PROT, ALBUMIN in the last 168 hours. No results for input(s): LIPASE, AMYLASE in the last 168 hours. No results for input(s): AMMONIA in the last 168 hours.  CBC: Recent Labs  Lab 04/15/18 0506 04/15/18 1929  04/17/18 0438 04/18/18 0552 04/19/18 0441 04/20/18 0446 04/21/18 0528  WBC 12.5* 11.5*   < > 14.3* 15.2* 11.0 5.3 9.2  NEUTROABS 10.2* 9.0*  --  12.1*  --  8.8*  --   --   HGB 12.1 11.1*   < > 10.4* 9.6* 9.0* 9.0* 8.5*  HCT 35.9 33.2*   < > 30.9* 28.9* 26.8* 26.5* 25.1*  MCV 94.4 94.9   < > 95.6 95.4 94.7 95.8 95.8  PLT 277 284   < > 249 232 226 180 205   < > = values in this interval not displayed.    Cardiac Enzymes: Recent Labs  Lab 04/15/18 0506 04/15/18 0852 04/15/18 1416 04/15/18 2032  TROPONINI 1.80* 2.84* 3.31* 3.38*    BNP: Invalid input(s): POCBNP  CBG: Recent Labs  Lab 04/20/18 1203 04/20/18 1635 04/20/18 2132 04/21/18 0804 04/21/18 1128  GLUCAP 113* 137* 111* 112* 97    Microbiology: Results for orders placed or performed during the hospital encounter of 04/15/18  Urine culture     Status: Abnormal   Collection  Time: 04/15/18  5:06 AM  Result Value Ref Range Status   Specimen Description   Final    URINE, CATHETERIZED Performed at [facility], [address], [place], [place] [address]    Special Requests   Final    NONE Performed at [facility], [address] [place], [place] [address]    Culture >=100,000 COLONIES/mL PROTEUS MIRABILIS (A)  Final   Report Status 04/17/2018 FINAL  Final   Organism ID, Bacteria PROTEUS MIRABILIS (A)  Final      Susceptibility   Proteus mirabilis - MIC*    AMPICILLIN <=2 SENSITIVE Sensitive     CEFAZOLIN <=4 SENSITIVE Sensitive     CEFTRIAXONE <=1 SENSITIVE Sensitive     CIPROFLOXACIN >=4 RESISTANT Resistant     GENTAMICIN <=1 SENSITIVE Sensitive     IMIPENEM 1 SENSITIVE Sensitive     NITROFURANTOIN 128 RESISTANT Resistant     TRIMETH/SULFA >=320 RESISTANT Resistant     AMPICILLIN/SULBACTAM <=2 SENSITIVE Sensitive     PIP/TAZO <=4 SENSITIVE Sensitive     * >=100,000 COLONIES/mL PROTEUS MIRABILIS  MRSA PCR Screening     Status: None   Collection Time: 04/15/18  3:25 PM  Result Value Ref Range Status   MRSA by PCR NEGATIVE NEGATIVE Final    Comment:  The GeneXpert MRSA Assay (FDA approved for NASAL specimens only), is one component of a comprehensive MRSA colonization surveillance program. It is not intended to diagnose MRSA infection nor to guide or monitor treatment for MRSA infections. Performed at [facility], [address] [place], [place] [address]     Coagulation Studies: No results for input(s): LABPROT, INR in the last 72 hours.  Urinalysis:  Recent Labs  Lab 04/15/18 0506  COLORURINE YELLOW*  LABSPEC 1.011  PHURINE 7.0  GLUCOSEU NEGATIVE  HGBUR NEGATIVE  BILIRUBINUR NEGATIVE  KETONESUR NEGATIVE  PROTEINUR NEGATIVE  NITRITE NEGATIVE  LEUKOCYTESUR LARGE*    Lipid Panel:     Component Value Date/Time   CHOL 181 04/16/2018 0517   TRIG 98 04/16/2018 0517   HDL 56 04/16/2018 0517    CHOLHDL 3.2 04/16/2018 0517   VLDL 20 04/16/2018 0517   LDLCALC 105 (H) 04/16/2018 0517    HgbA1C:  Lab Results  Component Value Date   HGBA1C 5.1 01/04/2018    Urine Drug Screen:  No results found for: LABOPIA, COCAINSCRNUR, LABBENZ, AMPHETMU, THCU, LABBARB  Alcohol Level: No results for input(s): ETH in the last 168 hours.  Other results: EKG: normal sinus rhythm at 62 bpm with sinus arrhythmia  Imaging: No results found.   Assessment/Plan: 77 year old female presenting with complaints consistent with myoclonus.  Has history of multiple vertebral compression fractures.  Has been followed by neurosurgery.  Reports having some tremor that worsened with the start of Neurontin.  Neurontin can cause some movement disorders.  Will consider taper to see if symptoms imporve.  Patient also with respiratory issues that may lead to movement disorder issues as well.  Polypharmacy may be contributing as well.  TSH normal.    Recommendations: 1.  Agree with continued addressing of respiratory issues 2.  Taper Neurontin.  Would decrease to 200mg  TID starting today. 3.  Will continue to follow clinically with continued taper as required.      Alexis Goodell, MD Neurology [phone] 04/21/2018, 1:27 PM

## 2018-04-21 NOTE — Progress Notes (Addendum)
[facility] [place] NAME: Sara Pierce    MR#:  [id number]  DATE OF BIRTH:  04-12-1941  SUBJECTIVE:  CHIEF COMPLAINT: Patient is moved to the telemetry floor.  Denies any chest pain but reporting jerking movements in her hands and legs which are new.  Denies any dysarthria or weakness   REVIEW OF SYSTEMS:  CONSTITUTIONAL: No fever, fatigue or weakness.  EYES: No blurred or double vision.  EARS, NOSE, AND THROAT: No tinnitus or ear pain.  RESPIRATORY: No cough, shortness of breath, wheezing or hemoptysis.  CARDIOVASCULAR: No chest pain, orthopnea, edema.  GASTROINTESTINAL: No nausea, vomiting, diarrhea or abdominal pain.  GENITOURINARY: No dysuria, hematuria.  ENDOCRINE: No polyuria, nocturia,  HEMATOLOGY: No anemia, easy bruising or bleeding SKIN: No rash or lesion. MUSCULOSKELETAL: Reporting leg cramps NEUROLOGIC: No tingling, numbness, weakness.  PSYCHIATRY: No anxiety or depression.   DRUG ALLERGIES:   Allergies  Allergen Reactions  . Aspirin Swelling  . Codeine Itching  . Naprosyn [Naproxen] Other (See Comments)    Per MAR pt allergic  . Other     Elastic Bandages/ Supports Per MAR  . Tape Hives  . Valacyclovir     VITALS:  Blood pressure (!) 91/49, pulse 80, temperature 97.8 F (36.6 C), temperature source Oral, resp. rate 18, height 5\' 5"  (1.651 m), weight 108.4 kg (238 lb 14.4 oz), SpO2 97 %.  PHYSICAL EXAMINATION:  GENERAL:  77 y.o.-year-old patient lying in the bed with no acute distress.  EYES: Pupils equal, round, reactive to light and accommodation. No scleral icterus. Extraocular muscles intact.  HEENT: Head atraumatic, normocephalic. Oropharynx and nasopharynx clear.  NECK:  Supple, no jugular venous distention. No thyroid enlargement, no tenderness.  LUNGS: Normal breath sounds bilaterally, no wheezing, rales,rhonchi or crepitation. No use of accessory muscles of respiration.  CARDIOVASCULAR: S1, S2  normal. No murmurs, rubs, or gallops.  No anterior chest wall tenderness on palpation ABDOMEN: Soft, nontender, nondistended. Bowel sounds present.  EXTREMITIES: No calf tenderness, no erythema , no pedal edema, cyanosis, or clubbing.  NEUROLOGIC: Lethargic but arousable PSYCHIATRIC: The patient is lethargic but arousable and answers  questions SKIN: No obvious rash, lesion, or ulcer.    LABORATORY PANEL:   CBC Recent Labs  Lab 04/21/18 0528  WBC 9.2  HGB 8.5*  HCT 25.1*  PLT 205   ------------------------------------------------------------------------------------------------------------------  Chemistries  Recent Labs  Lab 04/18/18 0552  04/21/18 0528  NA 135   < > 136  K 3.8   < > 4.2  CL 104   < > 105  CO2 25   < > 27  GLUCOSE 173*   < > 105*  BUN 14   < > 21*  CREATININE 0.55   < > 0.70  CALCIUM 8.8*   < > 9.3  MG 1.9  --   --    < > = values in this interval not displayed.   ------------------------------------------------------------------------------------------------------------------  Cardiac Enzymes Recent Labs  Lab 04/15/18 2032  TROPONINI 3.38*   ------------------------------------------------------------------------------------------------------------------  RADIOLOGY:  No results found.  EKG:   Orders placed or performed during the hospital encounter of 04/15/18  . EKG 12-Lead  . EKG 12-Lead  . ED EKG  . ED EKG  . EKG 12-Lead  . EKG 12-Lead  . EKG 12-Lead  . EKG 12-Lead  . EKG 12-Lead  . EKG 12-Lead  . EKG 12-Lead  . EKG 12-Lead  . EKG 12-Lead  . EKG 12-Lead  .  EKG 12-Lead  . EKG 12-Lead    ASSESSMENT AND PLAN:   1.  Myoclonic jerks could be from high-dose of Neurontin Seen by neurologist recommending to reduce the dose of Neurontin to 200 mg and reassess the patient tomorrow  2.   septic shock  2/2 UTI with Proteus [name] sensitive to ampicillin and cefazolin Patient is on cefepime Patient was  on stress dose steroids,  discontinue steroids  Weaned  off pressors , discontinued vasopressin and Levophed  hold beta-blocker and lisinopril   3.  Diabetes mellitus type 2: Hold oral hypoglycemic agents.  Sliding cell insulin while hospitalized next line   4. NSTEMI:   patient is asymptomatic Cardiology not recommending any procedures at this time outpatient follow-up is recommended LDL 105, patient is on high intensity statin CTA chest no PE   No aspirin given as the patient has respiratory distress as allergy.    Add beta-blocker if blood pressure is acceptable Not a candidate for cardiac rehab in view of non-STEMI Plan of care discussed with Dr. Ubaldo Glassing     5.  CHF:  stable, not fluid overloaded Holding Lasix, spironolactone and potassium supplements at this time in view of hypotension   6.  COPD: Stable; continue prednisone as well as inhaled corticosteroid.  Albuterol as needed.  7.  Hypothyroidism: NML TSH; continue Cytomel  8.  DVT prophylaxis: Therapeutic anticoagulation    Disposition: patient is requesting alternative living environment as patient's husband will not take care of her.  DSS involved.  Patient is going to go to skilled nursing facility [facility], APS will follow the patient at SNF   All the records are reviewed and case discussed with Care Management/Social Workerr. Management plans discussed with the patient, can reach out to husband if needed as per my discussion with the patient but does not how to give an update on daily basis CODE STATUS: fc , husband is a healthcare POA  TOTAL TIME TAKING CARE OF THIS PATIENT:29  minutes.   POSSIBLE D/C IN ?  DAYS, DEPENDING ON CLINICAL CONDITION.  Note: This dictation was prepared with Dragon dictation along with smaller phrase technology. Any transcriptional errors that result from this process are unintentional.   Nicholes Mango M.D on 04/21/2018 at 2:08 PM  Between 7am to 6pm - Pager - [phone] After 6pm go to [email or web address] -  password [email or web address] [facility]  [facility]  Office  [phone]  CC: Primary care physician; Marygrace Drought, MD

## 2018-04-21 NOTE — Progress Notes (Signed)
[place] for heparin drip management  Indication: NSTEMI  Allergies  Allergen Reactions  . Aspirin Swelling  . Codeine Itching  . Naprosyn [Naproxen] Other (See Comments)    Per MAR pt allergic  . Other     Elastic Bandages/ Supports Per MAR  . Tape Hives  . Valacyclovir     Patient Measurements: Height: 5\' 5"  (165.1 cm) Weight: 238 lb 14.4 oz (108.4 kg) IBW/kg (Calculated) : 57 Heparin Dosing Weight: 79 kg   Vital Signs: Temp: 98.2 F (36.8 C) (05/10 0339) Temp Source: Oral (05/10 0339) BP: 150/133 (05/10 0341) Pulse Rate: 87 (05/10 0339)  Labs: Recent Labs    04/19/18 0441  04/19/18 2146 04/20/18 0446 04/21/18 0528  HGB 9.0*  --   --  9.0* 8.5*  HCT 26.8*  --   --  26.5* 25.1*  PLT 226  --   --  180 205  HEPARINUNFRC 0.25*   < > 0.42 0.60 0.61  CREATININE  --   --   --  0.60 0.70   < > = values in this interval not displayed.    Estimated Creatinine Clearance: 75.6 mL/min (by C-G formula based on SCr of 0.7 mg/dL).   Medical History: Past Medical History:  Diagnosis Date  . Arthritis   . Cardiomyopathy ([place])   . Cataract   . Cerebral hemorrhage ([place])   . CHF (congestive heart failure) ([place])   . Chronic kidney disease   . Clotting disorder ([place])   . COPD (chronic obstructive pulmonary disease) ([place])   . Depression   . Diabetes mellitus without complication ([place])   . Hypertension   . Mixed incontinence   . Obesity   . Sleep apnea   . Thyroid disease   . Urinary frequency   . Vaginal atrophy   . Varicose veins     Medications:  Scheduled:  . ARIPiprazole  5 mg Oral QHS  . atorvastatin  40 mg Oral q1800  . budesonide  0.5 mg Nebulization BID  . buPROPion  150 mg Oral Daily  . chlorhexidine  15 mL Mouth Rinse BID  . clopidogrel  75 mg Oral Daily  . guaiFENesin  600 mg Oral BID   And  . dextromethorphan  30 mg Oral BID  . docusate sodium  100 mg Oral BID  . FLUoxetine  80 mg Oral Daily  . gabapentin   400 mg Oral TID  . hydrocortisone sod succinate (SOLU-CORTEF) inj  100 mg Intravenous Q8H  . insulin aspart  0-15 Units Subcutaneous TID WC  . insulin aspart  0-5 Units Subcutaneous QHS  . linagliptin  5 mg Oral Daily  . liothyronine  25 mcg Oral Daily  . mirtazapine  7.5 mg Oral QHS  . pantoprazole  40 mg Oral Daily  . sodium chloride flush  10-40 mL Intracatheter Q12H  . sodium chloride flush  3 mL Intravenous Q12H  . sodium chloride flush  3 mL Intravenous Q12H  . [START ON 04/23/2018] Vitamin D (Ergocalciferol)  50,000 Units Oral Q30 days   Infusions:  . sodium chloride 75 mL/hr at 04/21/18 0610  . ceFEPime (MAXIPIME) IV Stopped (04/20/18 2310)  . heparin 1,450 Units/hr (04/20/18 2158)  . sodium chloride    . vancomycin 1,000 mg (04/21/18 0615)    Assessment: Pharmacy consulted for heparin drip management for 77 yo female admitted with NSTEMI. Patient for CT on 5/6 with no PE identified. Patient currently receiving heparin at 1300 units/hr.  Goal of Therapy:  Heparin level 0.3-0.7 units/ml Monitor platelets by anticoagulation protocol: Yes   Plan:  05/10 @ 0530 HL 0.61 therapeutic. Will continue current rate @ 1450 units/hr and will recheck anti-Xa w/ am labs.  Tobie Lords, PharmD, BCPS Clinical Pharmacist 04/21/2018

## 2018-04-21 NOTE — Progress Notes (Signed)
Pharmacy Electrolyte Monitoring Consult:  Pharmacy consulted to assist in monitoring and replacing electrolytes in this 77 y.o. female admitted on 04/15/2018 with NSTEMI.   Patient is receiving NS @ 55mL/hr.   Labs:  Sodium (mmol/L)  Date Value  04/21/2018 136  01/14/2015 137   Potassium (mmol/L)  Date Value  04/21/2018 4.2  01/14/2015 4.4   Magnesium (mg/dL)  Date Value  04/18/2018 1.9  05/05/2014 2.3   Phosphorus (mg/dL)  Date Value  04/17/2018 3.3   Calcium (mg/dL)  Date Value  04/21/2018 9.3   Calcium, Total (mg/dL)  Date Value  01/14/2015 9.1   Albumin (g/dL)  Date Value  03/23/2018 4.0  10/05/2014 3.4    Plan:  Electrolytes are WNL. Will monitor every other day for now unless otherwise ordered per MD.   Ulice Dash D 04/21/2018 7:50 AM

## 2018-04-21 NOTE — Care Management (Signed)
Patient transferred out of icu 5/9.  Bed search for SNF for long term care as her husband is not able to meet her care needs in the home due to his alcoholism. Has been offered a bed at [facility]

## 2018-04-21 NOTE — Care Management Important Message (Signed)
Copy of signed IM left in patient's room.    

## 2018-04-22 LAB — CREATININE, SERUM
Creatinine, Ser: 0.71 mg/dL (ref 0.44–1.00)
GFR calc Af Amer: >60 mL/min (ref >60)
GFR calc non Af Amer: >60 mL/min (ref >60)

## 2018-04-22 LAB — GLUCOSE, CAPILLARY
Glucose-Capillary: 72 mg/dL (ref 65–99)
Glucose-Capillary: 135 mg/dL — ABNORMAL HIGH (ref 65–99)
Glucose-Capillary: 114 mg/dL — ABNORMAL HIGH (ref 65–99)

## 2018-04-22 LAB — HEPARIN LEVEL (UNFRACTIONATED): HEPARIN UNFRACTIONATED: 0.75 [IU]/mL — AB (ref 0.30–0.70)

## 2018-04-22 LAB — CBC
HCT: 26.3 % — ABNORMAL LOW (ref 35.0–47.0)
Hemoglobin: 8.9 g/dL — ABNORMAL LOW (ref 12.0–16.0)
MCH: 32.6 pg (ref 26.0–34.0)
MCHC: 33.7 g/dL (ref 32.0–36.0)
MCV: 96.7 fL (ref 80.0–100.0)
PLATELETS: 219 10*3/uL (ref 150–440)
RBC: 2.72 MIL/uL — ABNORMAL LOW (ref 3.80–5.20)
RDW: 16.8 % — AB (ref 11.5–14.5)
WBC: 8 10*3/uL (ref 3.6–11.0)

## 2018-04-22 MED ORDER — GABAPENTIN 100 MG PO CAPS: 200.0000 mg | ORAL_CAPSULE | Freq: Three times a day (TID) | ORAL | Status: AC

## 2018-04-22 MED ORDER — ALBUTEROL SULFATE (2.5 MG/3ML) 0.083% IN NEBU: 3.0000 mL | INHALATION_SOLUTION | Freq: Four times a day (QID) | RESPIRATORY_TRACT | 12 refills | Status: DC | PRN

## 2018-04-22 MED ORDER — CLOPIDOGREL BISULFATE 75 MG PO TABS: 75.0000 mg | ORAL_TABLET | Freq: Every day | ORAL | Status: DC

## 2018-04-22 MED ORDER — DEXTROMETHORPHAN POLISTIREX ER 30 MG/5ML PO SUER: 30.0000 mg | Freq: Two times a day (BID) | ORAL | 0 refills | Status: DC

## 2018-04-22 MED ORDER — FUROSEMIDE 20 MG PO TABS: 20.0000 mg | ORAL_TABLET | Freq: Two times a day (BID) | ORAL | Status: DC

## 2018-04-22 MED ORDER — DOCUSATE SODIUM 100 MG PO CAPS: 100.0000 mg | ORAL_CAPSULE | Freq: Two times a day (BID) | ORAL | 0 refills | Status: DC

## 2018-04-22 MED ORDER — ACETAMINOPHEN 325 MG PO TABS: 650.0000 mg | ORAL_TABLET | Freq: Four times a day (QID) | ORAL | Status: DC | PRN

## 2018-04-22 MED ORDER — TRAMADOL HCL 50 MG PO TABS: 50.0000 mg | ORAL_TABLET | Freq: Four times a day (QID) | ORAL | 0 refills | Status: DC | PRN

## 2018-04-22 MED ORDER — GUAIFENESIN ER 600 MG PO TB12: 600.0000 mg | ORAL_TABLET | Freq: Two times a day (BID) | ORAL | Status: DC

## 2018-04-22 MED ORDER — ATORVASTATIN CALCIUM 40 MG PO TABS: 40.0000 mg | ORAL_TABLET | Freq: Every day | ORAL | Status: DC

## 2018-04-22 MED ORDER — TIZANIDINE HCL 2 MG PO TABS: 2.0000 mg | ORAL_TABLET | Freq: Four times a day (QID) | ORAL | 0 refills | Status: DC | PRN

## 2018-04-22 MED ORDER — CLONAZEPAM 1 MG PO TABS: 1.0000 mg | ORAL_TABLET | Freq: Two times a day (BID) | ORAL | 0 refills | Status: DC

## 2018-04-22 MED ORDER — ACETYLCYSTEINE 20 % IN SOLN: 3.0000 mL | Freq: Two times a day (BID) | RESPIRATORY_TRACT | 12 refills | Status: DC

## 2018-04-22 NOTE — Progress Notes (Signed)
[facility] Cardiology [facility] Encounter Note  Patient: Sara Pierce / Admit Date: 04/15/2018 / Date of Encounter: 04/22/2018, 6:59 AM   Subjective: She had a good night with no further evidence of chest pain shortness of breath or new cough or congestion.  Patient is hemodynamically stable and improving overnight.  The patient has had improvements in infection.  No evidence of rhythm disturbances overnight and no concerns of true ischemia  Review of Systems: Positive for: Weakness Negative for: Vision change, hearing change, syncope, dizziness, nausea, vomiting,diarrhea, bloody stool, stomach pain, cough, congestion, diaphoresis, urinary frequency, urinary pain,skin lesions, skin rashes Others previously listed  Objective: Telemetry: Normal sinus rhythm Physical Exam: Blood pressure 112/64, pulse 88, temperature 98.4 F (36.9 C), temperature source Oral, resp. rate 17, height 5\' 5"  (1.651 m), weight 237 lb 12.8 oz (107.9 kg), SpO2 99 %. Body mass index is 39.57 kg/m. General: Well developed, well nourished, in no acute distress. Head: Normocephalic, atraumatic, sclera non-icteric, no xanthomas, nares are without discharge. Neck: No apparent masses Lungs: Normal respirations with no wheezes, some rhonchi, no rales , no crackles   Heart: Regular rate and rhythm, normal S1 S2, no murmur, no rub, no gallop, PMI is normal size and placement, carotid upstroke normal without bruit, jugular venous pressure normal Abdomen: Soft, non-tender, non-distended with normoactive bowel sounds. No hepatosplenomegaly. Abdominal aorta is normal size without bruit Extremities: Trace edema, no clubbing, no cyanosis, no ulcers,  Peripheral: 2+ radial, 2+ femoral, 2+ dorsal pedal pulses Neuro: Alert and oriented. Moves all extremities spontaneously. Psych:  Responds to questions appropriately with a normal affect.   Intake/Output Summary (Last 24 hours) at 04/22/2018 0659 Last data filed at 04/22/2018  0300 Gross per 24 hour  Intake 1426.17 ml  Output 2350 ml  Net -923.83 ml    Inpatient Medications:  . acetylcysteine  3 mL Oral BID  . ARIPiprazole  5 mg Oral QHS  . atorvastatin  40 mg Oral q1800  . budesonide  0.5 mg Nebulization BID  . buPROPion  150 mg Oral Daily  . chlorhexidine  15 mL Mouth Rinse BID  . chlorpheniramine-HYDROcodone  5 mL Oral Q12H  . clopidogrel  75 mg Oral Daily  . guaiFENesin  600 mg Oral BID   And  . dextromethorphan  30 mg Oral BID  . docusate sodium  100 mg Oral BID  . FLUoxetine  80 mg Oral Daily  . furosemide  20 mg Oral BID  . gabapentin  200 mg Oral TID  . hydrocortisone sod succinate (SOLU-CORTEF) inj  12.5 mg Intravenous Q12H  . insulin aspart  0-15 Units Subcutaneous TID WC  . insulin aspart  0-5 Units Subcutaneous QHS  . ipratropium-albuterol  3 mL Nebulization Q6H  . linagliptin  5 mg Oral Daily  . liothyronine  25 mcg Oral Daily  . mirtazapine  7.5 mg Oral QHS  . pantoprazole  40 mg Oral Daily  . sodium chloride flush  10-40 mL Intracatheter Q12H  . sodium chloride flush  3 mL Intravenous Q12H  . sodium chloride flush  3 mL Intravenous Q12H  . [START ON 04/23/2018] Vitamin D (Ergocalciferol)  50,000 Units Oral Q30 days   Infusions:  . ceFEPime (MAXIPIME) IV Stopped (04/21/18 2142)  . heparin 1,250 Units/hr (04/22/18 0548)  . vancomycin Stopped (04/22/18 0014)    Labs: Recent Labs    04/20/18 0446 04/21/18 0528 04/22/18 0452  NA 131* 136  --   K 4.0 4.2  --   CL  100* 105  --   CO2 27 27  --   GLUCOSE 139* 105*  --   BUN 17 21*  --   CREATININE 0.60 0.70 0.71  CALCIUM 9.3 9.3  --    No results for input(s): AST, ALT, ALKPHOS, BILITOT, PROT, ALBUMIN in the last 72 hours. Recent Labs    04/21/18 0528 04/22/18 0452  WBC 9.2 8.0  HGB 8.5* 8.9*  HCT 25.1* 26.3*  MCV 95.8 96.7  PLT 205 219   No results for input(s): CKTOTAL, CKMB, TROPONINI in the last 72 hours. Invalid input(s): POCBNP No results for input(s): HGBA1C  in the last 72 hours.   Weights: Filed Weights   04/20/18 2002 04/21/18 0339 04/22/18 0332  Weight: 239 lb 11.2 oz (108.7 kg) 238 lb 14.4 oz (108.4 kg) 237 lb 12.8 oz (107.9 kg)     Radiology/Studies:  Dg Chest 2 View  Result Date: 04/21/2018 CLINICAL DATA:  77 year old with increased shortness of breath and cough. EXAM: CHEST - 2 VIEW COMPARISON:  04/17/2018 FINDINGS: Again noted is a left jugular central line. The catheter tip is near the junction of left innominate vein and SVC. Persistent densities at the left lung base. Patchy hazy densities in the mid and lower right lung. Heart size remains upper limits of normal. Atherosclerotic calcifications at the aortic arch. Negative for a pneumothorax. Evidence for small to moderate sized pleural effusions based on the lateral view. Right shoulder replacement. IMPRESSION: Bibasilar chest densities are most compatible with pleural effusions with atelectasis/consolidation. Stable position of central line with the tip near the junction of the SVC and left innominate vein. Electronically Signed   By: Markus Daft M.D.   On: 04/21/2018 16:17   Dg Chest 2 View  Result Date: 03/23/2018 CLINICAL DATA:  Congestion, productive cough, pneumonia, shortness of breath, chest pain, history CHF, COPD, cardiomyopathy EXAM: CHEST - 2 VIEW COMPARISON:  01/05/2018 FINDINGS: Slightly rotated to the LEFT. Borderline enlargement of cardiac silhouette. Atherosclerotic calcification aorta. Enlarged central pulmonary arteries question pulmonary arterial hypertension. Bibasilar and RIGHT mid lung atelectasis. No definite acute infiltrate, pleural effusion or pneumothorax. Abnormal RIGHT paramediastinal density superior to the RIGHT hilum at the level of the aortic arch is likely related to the marked compression fracture of the T6 vertebral body with paraspinal calcific debris and spurring as noted on prior CT exams, accentuated by rotation to the LEFT. Bones demineralized with  note of a RIGHT shoulder prosthesis. IMPRESSION: Bibasilar atelectasis. No new abnormalities. Electronically Signed   By: Lavonia Dana M.D.   On: 03/23/2018 12:37   Ct Chest Wo Contrast  Result Date: 03/23/2018 CLINICAL DATA:  COPD exacerbation.  Dyspnea.  Productive cough. EXAM: CT CHEST WITHOUT CONTRAST TECHNIQUE: Multidetector CT imaging of the chest was performed following the standard protocol without IV contrast. COMPARISON:  Chest radiograph from earlier today. 11/26/2017 chest CT. FINDINGS: Cardiovascular: Normal heart size. No significant pericardial fluid/thickening. Left main and 3 vessel coronary atherosclerosis. Atherosclerotic nonaneurysmal thoracic aorta. Stable prominently dilated main pulmonary artery (4.2 cm diameter). Mediastinum/Nodes: Hypodense 1.0 cm left thyroid lobe nodule. Unremarkable esophagus. No pathologically enlarged axillary, mediastinal or gross hilar lymph nodes, noting limited sensitivity for the detection of hilar adenopathy on this noncontrast study. Lungs/Pleura: No pneumothorax. No pleural effusion. Chronic bandlike opacities in the dependent right upper lobe and dependent/medial bilateral lower lobes with associated volume loss, compatible with chronic postinfectious/postinflammatory scarring. No acute consolidative airspace disease or lung masses. Sub solid 4 mm right middle lobe pulmonary nodule (  series 3/image 80) is stable since 10/24/2017 chest CT. No new significant pulmonary nodules. Upper abdomen: Small hiatal hernia. Stable thickening of the left greater than right adrenal glands without discrete adrenal nodules. Musculoskeletal: No aggressive appearing focal osseous lesions. Stable chronic severe T6 vertebral compression fracture. Marked thoracic spondylosis. Partially visualized right shoulder arthroplasty. Stable subcutaneous upper right back 1.8 cm sebaceous cyst. IMPRESSION: 1. Stable chronic postinfectious/postinflammatory scarring in the dependent right  upper lobe and dependent bilateral lower lobes. No superimposed acute consolidative airspace disease. 2. Sub solid 4 mm right middle lobe pulmonary nodule, stable since 10/24/2017 chest CT, probably benign. No follow-up recommended. This recommendation follows the consensus statement: Guidelines for Management of Incidental Pulmonary Nodules Detected on CT Images: From the Fleischner Society 2017; Radiology 2017; 284:228-243. 3. Stable prominently dilated main pulmonary artery, suggesting chronic pulmonary arterial hypertension. 4. Left main and 3 vessel coronary atherosclerosis. 5. Small hiatal hernia. Aortic Atherosclerosis (ICD10-I70.0). Electronically Signed   By: Ilona Sorrel M.D.   On: 03/23/2018 15:36   Ct Angio Chest Pe W Or Wo Contrast  Result Date: 04/17/2018 CLINICAL DATA:  77 year old female admitted with bilateral lower extremity pain and cramping. Mildly elevated troponin. Wheezing. EXAM: CT ANGIOGRAPHY CHEST WITH CONTRAST TECHNIQUE: Multidetector CT imaging of the chest was performed using the standard protocol during bolus administration of intravenous contrast. Multiplanar CT image reconstructions and MIPs were obtained to evaluate the vascular anatomy. CONTRAST:  82mL ISOVUE-370 IOPAMIDOL (ISOVUE-370) INJECTION 76% COMPARISON:  March 23, 2018 chest CT.  Chest x-ray Apr 17, 2018. FINDINGS: Cardiovascular: Atherosclerotic changes are seen in the thoracic aorta without aneurysm or dissection. Cardiomegaly is noted. Coronary artery calcifications are identified. The main pulmonary artery measures 4.3 cm which is dilated. Evaluation of pulmonary arteries is somewhat limited due to respiratory motion. Within this limitation, no emboli identified. Mediastinum/Nodes: A sebaceous cyst is seen over the right posterior shoulder, unchanged. The thyroid and esophagus are normal. Small pleural effusions. No pericardial effusion. No adenopathy. Lungs/Pleura: Small bilateral pleural effusions with underlying  atelectasis. No suspicious infiltrate. Suspected mild edema with interlobular septal thickening in the apices. No suspicious nodules or masses. Upper Abdomen: No acute abnormality. Musculoskeletal: There is a compression fracture of an upper thoracic vertebral body, unchanged since March 23, 2018. Review of the MIP images confirms the above findings. IMPRESSION: 1. No pulmonary emboli identified. 2. Cardiomegaly, small effusions, and mild edema. 3. Atherosclerotic changes in the nonaneurysmal aorta. Coronary artery calcifications. 4. Dilatation of the main pulmonary artery measuring 4.3 cm. This is suggestive of pulmonary arterial hypertension. 5. Stable compression fracture of an upper thoracic vertebral body, unchanged since March 23, 2018. Aortic Atherosclerosis (ICD10-I70.0). Electronically Signed   By: Dorise Bullion III M.D   On: 04/17/2018 10:07   Dg Chest Port 1 View  Result Date: 04/17/2018 CLINICAL DATA:  Follow-up pneumonia EXAM: PORTABLE CHEST 1 VIEW COMPARISON:  04/15/2018 FINDINGS: Cardiac shadow is stable. Left jugular central line is again seen. Lungs are well aerated bilaterally. Persistent changes in the left base are seen. No new focal infiltrate is noted. Postsurgical changes in the right shoulder are seen. IMPRESSION: Stable left basilar changes.  No acute abnormality noted. Electronically Signed   By: Inez Catalina M.D.   On: 04/17/2018 07:17   Dg Chest Port 1 View  Result Date: 04/15/2018 CLINICAL DATA:  Central line placement. EXAM: PORTABLE CHEST 1 VIEW COMPARISON:  Radiograph of Apr 15, 2018. FINDINGS: Stable cardiomediastinal silhouette. Atherosclerosis of thoracic aorta is noted. Interval placement of left  internal jugular catheter with distal tip in expected position of the SVC. No pneumothorax is noted. Right lung is clear. Mild left basilar subsegmental atelectasis or scarring is noted with possible minimal left pleural effusion. Bony thorax is unremarkable. IMPRESSION: Interval  placement of left internal jugular catheter with distal tip in expected position of the SVC. No pneumothorax is noted. Mild left basilar subsegmental atelectasis or scarring is noted with possible minimal left pleural effusion. Electronically Signed   By: Marijo Conception, M.D.   On: 04/15/2018 20:23   Dg Chest Port 1 View  Result Date: 04/15/2018 CLINICAL DATA:  77 year old female with cough. EXAM: PORTABLE CHEST 1 VIEW COMPARISON:  Chest CT dated 03/23/2018 FINDINGS: There is no focal consolidation, pleural effusion, or pneumothorax. The cardiac silhouette is within normal limits. Atherosclerotic calcification of the aorta. Old healed left humeral neck fracture and right shoulder hemiarthroplasty. No acute osseous pathology. IMPRESSION: No active disease. Electronically Signed   By: Anner Crete M.D.   On: 04/15/2018 06:00     Assessment and Recommendation  77 y.o. female with known cardiovascular disease essential hypertension mixed hyperlipidemia diabetes having acute sepsis and non-ST elevation myocardial infarction with probable cause of infarction due to stress-induced ischemia.  No further significant symptoms of heart failure angina or EKG changes or hemodynamic instability 1.  Continue supportive care of sepsis and infection overall improving 2.  Begin ambulation and physical rehabilitation and follow for improvements of symptoms 3.  No further cardiac diagnostics necessary at this time 4.  We will further evaluate the possibility of further intervention from myocardial infarction depending on ability for medication management and ambulation without symptoms although at this point the patient is doing quite well and will not need further intervention other than medication management 5.  Call if further questions  Signed, Serafina Royals M.D. FACC

## 2018-04-22 NOTE — Progress Notes (Addendum)
Called receiving facility and gave nurse-to-nurse report on patient. All questions answered. Medications to remain with patient during transport. [facility] for non-emergent patient transport to the facility via EMS. Awaiting arrival. Wenda Low [facility]

## 2018-04-22 NOTE — Progress Notes (Signed)
[facility] for heparin drip management  Indication: NSTEMI  Allergies  Allergen Reactions  . Aspirin Swelling  . Codeine Itching  . Naprosyn [Naproxen] Other (See Comments)    Per MAR pt allergic  . Other     Elastic Bandages/ Supports Per MAR  . Tape Hives  . Valacyclovir     Patient Measurements: Height: 5\' 5"  (165.1 cm) Weight: 237 lb 12.8 oz (107.9 kg) IBW/kg (Calculated) : 57 Heparin Dosing Weight: 79 kg   Vital Signs: Temp: 98.4 F (36.9 C) (05/11 0332) Temp Source: Oral (05/11 0332) BP: 112/64 (05/11 0332) Pulse Rate: 88 (05/11 0332)  Labs: Recent Labs    04/20/18 0446 04/21/18 0528 04/22/18 0452  HGB 9.0* 8.5* 8.9*  HCT 26.5* 25.1* 26.3*  PLT 180 205 219  HEPARINUNFRC 0.60 0.61 0.75*  CREATININE 0.60 0.70 0.71    Estimated Creatinine Clearance: 75.4 mL/min (by C-G formula based on SCr of 0.71 mg/dL).   Medical History: Past Medical History:  Diagnosis Date  . Arthritis   . Cardiomyopathy ([place])   . Cataract   . Cerebral hemorrhage ([place])   . CHF (congestive heart failure) ([place])   . Chronic kidney disease   . Clotting disorder ([place])   . COPD (chronic obstructive pulmonary disease) ([place])   . Depression   . Diabetes mellitus without complication ([place])   . Hypertension   . Mixed incontinence   . Obesity   . Sleep apnea   . Thyroid disease   . Urinary frequency   . Vaginal atrophy   . Varicose veins     Medications:  Scheduled:  . acetylcysteine  3 mL Oral BID  . ARIPiprazole  5 mg Oral QHS  . atorvastatin  40 mg Oral q1800  . budesonide  0.5 mg Nebulization BID  . buPROPion  150 mg Oral Daily  . chlorhexidine  15 mL Mouth Rinse BID  . chlorpheniramine-HYDROcodone  5 mL Oral Q12H  . clopidogrel  75 mg Oral Daily  . guaiFENesin  600 mg Oral BID   And  . dextromethorphan  30 mg Oral BID  . docusate sodium  100 mg Oral BID  . FLUoxetine  80 mg Oral Daily  . furosemide  20 mg Oral BID  . gabapentin   200 mg Oral TID  . hydrocortisone sod succinate (SOLU-CORTEF) inj  12.5 mg Intravenous Q12H  . insulin aspart  0-15 Units Subcutaneous TID WC  . insulin aspart  0-5 Units Subcutaneous QHS  . ipratropium-albuterol  3 mL Nebulization Q6H  . linagliptin  5 mg Oral Daily  . liothyronine  25 mcg Oral Daily  . mirtazapine  7.5 mg Oral QHS  . pantoprazole  40 mg Oral Daily  . sodium chloride flush  10-40 mL Intracatheter Q12H  . sodium chloride flush  3 mL Intravenous Q12H  . sodium chloride flush  3 mL Intravenous Q12H  . [START ON 04/23/2018] Vitamin D (Ergocalciferol)  50,000 Units Oral Q30 days   Infusions:  . ceFEPime (MAXIPIME) IV Stopped (04/21/18 2142)  . heparin 1,450 Units/hr (04/21/18 1651)  . vancomycin Stopped (04/22/18 0014)    Assessment: Pharmacy consulted for heparin drip management for 77 yo female admitted with NSTEMI. Patient for CT on 5/6 with no PE identified. Patient currently receiving heparin at 1300 units/hr.   Goal of Therapy:  Heparin level 0.3-0.7 units/ml Monitor platelets by anticoagulation protocol: Yes   Plan:  05/11 @ 0500 HL 0.75 supratherapeutic. Will decrease rate  to 1250 units/hr and will recheck anti-Xa @ 1300.  Tobie Lords, PharmD, BCPS Clinical Pharmacist 04/22/2018

## 2018-04-22 NOTE — Clinical Social Work Note (Addendum)
CSW contacted [facility] and spoke with Lavella Lemons to ensure that they can receive the client on the weekend with no notice given yesterday of a discharge for the weekend. The weekend supervisor will call the CSW back with information once she speaks to an admissions professional who is on call. CSW is following for discharge facilitation.  UPDATE: CSW attempted to contact the weekend supervisor at [facility] at 2:45 PM. No supervisor was available. The front desk representative (A female who would not give his name) took a message. CSW will continue to attempt discharge today as the admission coordinator Liliane Channel during the week indicated to the [facility] team that the patient would be able to discharge at any time.   UPDATE: The CSW contacted [facility] again at 3:52 PM to inquire after the weekend supervisor. The representative Kenney Houseman) told this CSW that the facility does not have a weekend supervisor. She explained that Liliane Channel, the admissions coordinator, "only gives the offers. The Director of Nursing does the admission". Lavella Lemons went on to explain that they have attempted multiple times to contact the DON with no return call. Lavella Lemons reported that they would not be able to accept the patient until Monday. The CSW advised Lavella Lemons that the situation would be escalated to the [facility] leadership. Lavella Lemons did give the name of the DON: Particia Lather. CSW has updated the attending MD and the attending RN of the issue. CSW is following.  UPDATE: At 4:06PM, a representative from [facility] called and reported that the patient can discharge today, after all. The CSW has updated the team and will deliver the discharge packet when able, after which time, the CSW will sign off. The CSW has updated the on-call APS worker of the discharge so that APS can continue to follow the patient in the community.  Santiago Bumpers, MSW, Latanya Presser [phone]

## 2018-04-22 NOTE — Progress Notes (Signed)
When Dr. Margaretmary Eddy began talking with the patient she became upset and started crying. A few seconds later somehow I was accused of not liking my job because I didn't give a PRN pain medication that was never asked for. Patient to be discharged sometime this afternoon to a SNF for rehabilitation. Will continue to monitor. Wenda Low [facility]

## 2018-04-22 NOTE — Progress Notes (Signed)
PT Cancellation Note  Patient Details Name: EIMI VINEY MRN: [id number] DOB: 1941-04-27   Cancelled Treatment:    Reason Eval/Treat Not Completed: Patient declined, no reason specified.  Order received.  Chart reviewed.  Pt declined, stating that she needed "one more day" and that her legs were in too much pain to participate with therapy.  Will re-attempt at a later date.   Roxanne Gates, PT, DPT 04/22/2018, 4:41 PM
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
## 2018-04-22 NOTE — Discharge Instructions (Addendum)
follow up with primary care physician at the facility in 5 to 7 days Follow-up with CHF clinic in 2 to 3 days Follow-up with cardiology Dr. Ubaldo Glassing in 1 week Daily weight monitoring, intake and output Muscle Cramps and Spasms Muscle cramps and spasms occur when a muscle or muscles tighten and you have no control over this tightening (involuntary muscle contraction). They are a common problem and can develop in any muscle. The most common place is in the calf muscles of the leg. Muscle cramps and muscle spasms are both involuntary muscle contractions, but there are some differences between the two:  Muscle cramps are painful. They come and go and may last a few seconds to 15 minutes. Muscle cramps are often more forceful and last longer than muscle spasms.  Muscle spasms may or may not be painful. They may also last just a few seconds or much longer.  Certain medical conditions, such as diabetes or Parkinson disease, can make it more likely to develop cramps or spasms. However, cramps or spasms are usually not caused by a serious underlying problem. Common causes include:  Overexertion.  Overuse from repetitive motions, or doing the same thing over and over.  Remaining in a certain position for a long period of time.  Improper preparation, form, or technique while playing a sport or doing an activity.  Dehydration.  Injury.  Side effects of some medicines.  Abnormally low levels of the salts and ions in your blood (electrolytes), especially potassium and calcium. This could happen if you are taking water pills (diuretics) or if you are pregnant.  In many cases, the cause of muscle cramps or spasms is unknown. Follow these instructions at home:  Stay well hydrated. Drink enough fluid to keep your urine clear or pale yellow.  Try massaging, stretching, and relaxing the affected muscle.  If directed, apply heat to tight or tense muscles as often as told by your health care provider. Use  the heat source that your health care provider recommends, such as a moist heat pack or a heating pad. ? Place a towel between your skin and the heat source. ? Leave the heat on for 20-30 minutes. ? Remove the heat if your skin turns bright red. This is especially important if you are unable to feel pain, heat, or cold. You may have a greater risk of getting burned.  If directed, put ice on the affected area. This may help if you are sore or have pain after a cramp or spasm. ? Put ice in a plastic bag. ? Place a towel between your skin and the bag. ? Leavethe ice on for 20 minutes, 2-3 times a day.  Take over-the-counter and prescription medicines only as told by your health care provider.  Pay attention to any changes in your symptoms. Contact a health care provider if:  Your cramps or spasms get more severe or happen more often.  Your cramps or spasms do not improve over time. This information is not intended to replace advice given to you by your health care provider. Make sure you discuss any questions you have with your health care provider. Document Released: 05/21/2002 Document Revised: 12/31/2015 Document Reviewed: 09/02/2015 Elsevier Interactive Patient Education  2018 [employer].

## 2018-05-12 ENCOUNTER — Encounter: Payer: Self-pay | Admitting: Family

## 2018-05-12 ENCOUNTER — Ambulatory Visit: Payer: No Typology Code available for payment source | Attending: Family | Admitting: Family

## 2018-05-12 VITALS — BP 113/59 | HR 66 | Resp 18 | Ht 65.0 in | Wt 211.0 lb

## 2018-05-12 DIAGNOSIS — I509 Heart failure, unspecified: Secondary | ICD-10-CM | POA: Diagnosis present

## 2018-05-12 DIAGNOSIS — I252 Old myocardial infarction: Secondary | ICD-10-CM | POA: Insufficient documentation

## 2018-05-12 DIAGNOSIS — E079 Disorder of thyroid, unspecified: Secondary | ICD-10-CM | POA: Diagnosis not present

## 2018-05-12 DIAGNOSIS — I130 Hypertensive heart and chronic kidney disease with heart failure and stage 1 through stage 4 chronic kidney disease, or unspecified chronic kidney disease: Secondary | ICD-10-CM | POA: Insufficient documentation

## 2018-05-12 DIAGNOSIS — Z8249 Family history of ischemic heart disease and other diseases of the circulatory system: Secondary | ICD-10-CM | POA: Insufficient documentation

## 2018-05-12 DIAGNOSIS — I5032 Chronic diastolic (congestive) heart failure: Secondary | ICD-10-CM | POA: Diagnosis not present

## 2018-05-12 DIAGNOSIS — N189 Chronic kidney disease, unspecified: Secondary | ICD-10-CM | POA: Diagnosis not present

## 2018-05-12 DIAGNOSIS — G4733 Obstructive sleep apnea (adult) (pediatric): Secondary | ICD-10-CM | POA: Diagnosis not present

## 2018-05-12 DIAGNOSIS — E1122 Type 2 diabetes mellitus with diabetic chronic kidney disease: Secondary | ICD-10-CM | POA: Diagnosis not present

## 2018-05-12 DIAGNOSIS — Z885 Allergy status to narcotic agent status: Secondary | ICD-10-CM | POA: Diagnosis not present

## 2018-05-12 DIAGNOSIS — Z888 Allergy status to other drugs, medicaments and biological substances status: Secondary | ICD-10-CM | POA: Insufficient documentation

## 2018-05-12 DIAGNOSIS — Z87891 Personal history of nicotine dependence: Secondary | ICD-10-CM | POA: Diagnosis not present

## 2018-05-12 DIAGNOSIS — Z7984 Long term (current) use of oral hypoglycemic drugs: Secondary | ICD-10-CM | POA: Insufficient documentation

## 2018-05-12 DIAGNOSIS — Z6835 Body mass index (BMI) 35.0-35.9, adult: Secondary | ICD-10-CM | POA: Insufficient documentation

## 2018-05-12 DIAGNOSIS — E119 Type 2 diabetes mellitus without complications: Secondary | ICD-10-CM

## 2018-05-12 DIAGNOSIS — Z886 Allergy status to analgesic agent status: Secondary | ICD-10-CM | POA: Insufficient documentation

## 2018-05-12 DIAGNOSIS — Z7902 Long term (current) use of antithrombotics/antiplatelets: Secondary | ICD-10-CM | POA: Diagnosis not present

## 2018-05-12 DIAGNOSIS — F329 Major depressive disorder, single episode, unspecified: Secondary | ICD-10-CM | POA: Diagnosis not present

## 2018-05-12 DIAGNOSIS — E669 Obesity, unspecified: Secondary | ICD-10-CM | POA: Diagnosis not present

## 2018-05-12 DIAGNOSIS — Z79899 Other long term (current) drug therapy: Secondary | ICD-10-CM | POA: Insufficient documentation

## 2018-05-12 DIAGNOSIS — J449 Chronic obstructive pulmonary disease, unspecified: Secondary | ICD-10-CM | POA: Insufficient documentation

## 2018-05-12 DIAGNOSIS — I10 Essential (primary) hypertension: Secondary | ICD-10-CM

## 2018-05-12 DIAGNOSIS — Z7951 Long term (current) use of inhaled steroids: Secondary | ICD-10-CM | POA: Diagnosis not present

## 2018-05-12 NOTE — Progress Notes (Signed)
Patient ID: Sara Pierce, female    DOB: 1941/03/31, 77 y.o.   MRN: [id number]  HPI  Ms Thole is a 77 y/o female with a history of clotting disorder, DM, HTN, CKD, thyroid disease, COPD, depression, obstructive sleep apnea, previous tobacco use and chronic heart failure.   Echo report from 04/16/18 reviewed and showed an EF of 55-65% along with trivial AR and mild MR.  Admitted 04/15/18 due to NSTEMI. Cardiology consult obtained. CTA was negative for PE. Neurology consult also obtained as she had myoclonic jerks thought to be due to neurontin dosage. Antibiotics were given due to septic shock. She was discharged after 7 days. Admitted 03/23/18 due to COPD exacerbation. IV steroids initially given and then transitioned or oral prednisone. Antibiotics given for positive MRSA. Discharged to rehab after 4 days.  She presents today for her initial visit with a chief complaint of minimal fatigue upon moderate exertion. She describes this as chronic in nature having been present for several years. She has associated palpitations, dizziness (due to vertigo), occasional chest pain (with anxiety), easy bruising and anxiety along with this. She denies any difficulty sleeping, abdominal distention, shortness of breath, cough or weight gain. Has been unable to stand since ~ September 2018 due to broken back/shoulder. Uses a hoyer lift to transfer at home. Currently at [facility] for rehab.   Past Medical History:  Diagnosis Date  . Arthritis   . Cardiomyopathy ([place])   . Cataract   . Cerebral hemorrhage ([place])   . CHF (congestive heart failure) ([place])   . Chronic kidney disease   . Clotting disorder ([place])   . COPD (chronic obstructive pulmonary disease) ([place])   . Depression   . Diabetes mellitus without complication ([place])   . Hypertension   . Mixed incontinence   . Obesity   . Sleep apnea   . Thyroid disease   . Urinary frequency   . Vaginal atrophy   . Varicose veins    Past Surgical History:   Procedure Laterality Date  . APPENDECTOMY    . Cardiac Catherization    . COLONOSCOPY WITH PROPOFOL N/A 04/28/2016   Procedure: COLONOSCOPY WITH PROPOFOL;  Surgeon: Manya Silvas, MD;  Location: [facility] ENDOSCOPY;  Service: Endoscopy;  Laterality: N/A;  . COLONOSCOPY WITH PROPOFOL N/A 04/29/2016   Procedure: COLONOSCOPY WITH PROPOFOL;  Surgeon: Manya Silvas, MD;  Location: [facility] ENDOSCOPY;  Service: Endoscopy;  Laterality: N/A;  . JOINT REPLACEMENT    . STRABISMUS SURGERY    . TONSILLECTOMY    . TUBAL LIGATION     Family History  Problem Relation Age of Onset  . Heart attack Father   . Coronary artery disease Father   . Hypertension Father   . Hyperlipidemia Father   . Heart attack Mother   . Hypertension Mother   . Hyperlipidemia Mother   . Breast cancer Neg Hx    Social History   Tobacco Use  . Smoking status: Former Smoker    Packs/day: 0.50    Years: 44.00    Pack years: 22.00    Types: Cigarettes  . Smokeless tobacco: Never Used  Substance Use Topics  . Alcohol use: No   Allergies  Allergen Reactions  . Aspirin Swelling  . Codeine Itching  . Naprosyn [Naproxen] Other (See Comments)    Per MAR pt allergic  . Other     Elastic Bandages/ Supports Per MAR  . Tape Hives  . Valacyclovir    Prior to Admission  medications   Medication Sig Start Date End Date Taking? Authorizing Provider  acetaminophen (TYLENOL) 325 MG tablet Take 2 tablets (650 mg total) by mouth every 6 (six) hours as needed for mild pain (or Fever >/= 101). 04/22/18  Yes Gouru, Aruna, MD  ARIPiprazole (ABILIFY) 5 MG tablet Take 5 mg by mouth at bedtime.   Yes [provider]  budesonide (PULMICORT) 0.5 MG/2ML nebulizer solution Take 2 mLs (0.5 mg total) 2 (two) times daily by nebulization. 10/28/17  Yes Demetrios Loll, MD  buPROPion (WELLBUTRIN XL) 150 MG 24 hr tablet Take 150 mg by mouth daily.   Yes [provider]  clonazePAM (KLONOPIN) 1 MG tablet Take 1 tablet (1 mg total) by  mouth 2 (two) times daily. 04/22/18  Yes Gouru, Illene Silver, MD  clopidogrel (PLAVIX) 75 MG tablet Take 1 tablet (75 mg total) by mouth daily. 04/23/18  Yes Gouru, Illene Silver, MD  dextromethorphan (DELSYM) 30 MG/5ML liquid Take 5 mLs (30 mg total) by mouth 2 (two) times daily. 04/22/18  Yes Gouru, Illene Silver, MD  docusate sodium (COLACE) 100 MG capsule Take 1 capsule (100 mg total) by mouth 2 (two) times daily. 04/22/18  Yes Gouru, Illene Silver, MD  FLUoxetine (PROZAC) 40 MG capsule Take 80 mg by mouth daily.   Yes [provider]  furosemide (LASIX) 20 MG tablet Take 1 tablet (20 mg total) by mouth 2 (two) times daily. 04/22/18  Yes Gouru, Illene Silver, MD  gabapentin (NEURONTIN) 100 MG capsule Take 2 capsules (200 mg total) by mouth 3 (three) times daily. 04/22/18  Yes Gouru, Illene Silver, MD  guaiFENesin (MUCINEX) 600 MG 12 hr tablet Take 1 tablet (600 mg total) by mouth 2 (two) times daily. 04/22/18  Yes Gouru, Illene Silver, MD  liothyronine (CYTOMEL) 25 MCG tablet Take 25 mcg by mouth daily.    Yes [provider]  montelukast (SINGULAIR) 10 MG tablet Take 10 mg by mouth at bedtime.    Yes [provider]  omeprazole (PRILOSEC) 20 MG capsule Take 20 mg by mouth daily.   Yes [provider]  potassium chloride (K-DUR) 10 MEQ tablet Take 10 mEq by mouth 2 (two) times daily.    Yes [provider]  simvastatin (ZOCOR) 40 MG tablet Take 40 mg by mouth at bedtime.    Yes [provider]  sitaGLIPtin (JANUVIA) 100 MG tablet Take 100 mg by mouth daily.   Yes [provider]  tiZANidine (ZANAFLEX) 2 MG tablet Take 1 tablet (2 mg total) by mouth every 6 (six) hours as needed for muscle spasms. 04/22/18  Yes Gouru, Illene Silver, MD  traMADol (ULTRAM) 50 MG tablet Take 1 tablet (50 mg total) by mouth every 6 (six) hours as needed for moderate pain. 04/22/18  Yes Gouru, Aruna, MD  umeclidinium-vilanterol (ANORO ELLIPTA) 62.5-25 MCG/INH AEPB Inhale 1 puff into the lungs daily.   Yes [provider]  albuterol (PROVENTIL) (2.5 MG/3ML) 0.083% nebulizer solution Inhale 3 mLs into the lungs every 6 (six) hours as needed for wheezing or shortness of breath. Patient not taking: Reported on 05/12/2018 04/22/18   Nicholes Mango, MD  mirtazapine (REMERON) 15 MG tablet Take 7.5 mg by mouth at bedtime.    [provider]    Review of Systems  Constitutional: Positive for fatigue. Negative for appetite change.  HENT: Negative for congestion, postnasal drip and sore throat.   Eyes: Negative.   Respiratory: Negative for cough, chest tightness and shortness of breath.   Cardiovascular: Positive for chest pain (with  anxiety) and palpitations. Negative for leg swelling.  Gastrointestinal: Negative for abdominal distention and abdominal pain.  Endocrine: Negative.   Genitourinary: Negative.   Musculoskeletal: Negative for back pain and neck pain.  Skin: Negative.   Allergic/Immunologic: Negative.   Neurological: Positive for dizziness (with vertigo). Negative for light-headedness.  Hematological: Negative for adenopathy. Bruises/bleeds easily.  Psychiatric/Behavioral: Negative for dysphoric mood and sleep disturbance (sleeping with oxygen on). The patient is nervous/anxious.     Vitals:   05/12/18 1318  BP: (!) 113/59  Pulse: 66  Resp: 18  SpO2: 97%  Weight: 211 lb (95.7 kg)  Height: 5\' 5"  (1.651 m)   Wt Readings from Last 3 Encounters:  05/12/18 211 lb (95.7 kg)  04/22/18 237 lb 12.8 oz (107.9 kg)  03/27/18 219 lb 1.6 oz (99.4 kg)   Lab Results  Component Value Date   CREATININE 0.71 04/22/2018   CREATININE 0.70 04/21/2018   CREATININE 0.60 04/20/2018   Physical Exam  Constitutional: She is oriented to person, place, and time. She appears well-developed and well-nourished.  HENT:  Head: Normocephalic and atraumatic.  Neck: Normal range of motion. Neck supple. No JVD present.  Cardiovascular: Normal rate and regular rhythm.  Pulmonary/Chest: Effort normal. No respiratory  distress. She has no wheezes. She has no rales.  Abdominal: Soft. She exhibits no distension.  Musculoskeletal: She exhibits no edema or tenderness.  Neurological: She is alert and oriented to person, place, and time.  Skin: Skin is warm and dry.  Psychiatric: She has a normal mood and affect. Her behavior is normal. Thought content normal.  Nursing note and vitals reviewed.  Assessment & Plan:  1: Chronic heart failure with preserved ejection fraction- - NYHA class II - euvolemic today - being weighed inconsistently; order written for her to be weighed daily and to call for an overnight weight gain of >2 pounds or a weekly weight gain of >5 pounds - not adding salt to her food and wasn't adding salt when she was home. Reviewed the importance of closely following a 2000mg  sodium diet once she returns home. Written information given to her about this - is not physically active as she's unable to stand and uses a hoyer lift at home to transfer - saw cardiology Etta Quill) 03/23/18 - currently wearing oxygen at 2L around the clock - BNP 03/23/18 was 68.0  2: HTN- - BP looks good today - sees PCP at [facility] - BMP on 04/21/18 reviewed and showed sodium 136, potassium 4.2 and GFR >60  3: DM- - glucose being checked at the facility - A1c on 01/04/18 was 5.1%  Facility medication list was reviewed.  Return in 6 weeks or sooner for any questions/problems before then.

## 2018-05-12 NOTE — Patient Instructions (Addendum)
Continue weighing daily and call for an overnight weight gain of > 2 pounds or a weekly weight gain of >5 pounds.   Low-Sodium Eating Plan Sodium, which is an element that makes up salt, helps you maintain a healthy balance of fluids in your body. Too much sodium can increase your blood pressure and cause fluid and waste to be held in your body. Your health care provider or dietitian may recommend following this plan if you have high blood pressure (hypertension), kidney disease, liver disease, or heart failure. Eating less sodium can help lower your blood pressure, reduce swelling, and protect your heart, liver, and kidneys. What are tips for following this plan? General guidelines  Most people on this plan should limit their sodium intake to 2,000 mg (milligrams) of sodium each day. Reading food labels  The Nutrition Facts label lists the amount of sodium in one serving of the food. If you eat more than one serving, you must multiply the listed amount of sodium by the number of servings.  Choose foods with less than 140 mg of sodium per serving.  Avoid foods with 300 mg of sodium or more per serving. Shopping  Look for lower-sodium products, often labeled as "low-sodium" or "no salt added."  Always check the sodium content even if foods are labeled as "unsalted" or "no salt added".  Buy fresh foods. ? Avoid canned foods and premade or frozen meals. ? Avoid canned, cured, or processed meats  Buy breads that have less than 80 mg of sodium per slice. Cooking  Eat more home-cooked food and less restaurant, buffet, and fast food.  Avoid adding salt when cooking. Use salt-free seasonings or herbs instead of table salt or sea salt. Check with your health care provider or pharmacist before using salt substitutes.  Cook with plant-based oils, such as canola, sunflower, or olive oil. Meal planning  When eating at a restaurant, ask that your food be prepared with less salt or no salt, if  possible.  Avoid foods that contain MSG (monosodium glutamate). MSG is sometimes added to Chinese food, bouillon, and some canned foods. What foods are recommended? The items listed may not be a complete list. Talk with your dietitian about what dietary choices are best for you. Grains Low-sodium cereals, including oats, puffed wheat and rice, and shredded wheat. Low-sodium crackers. Unsalted rice. Unsalted pasta. Low-sodium bread. Whole-grain breads and whole-grain pasta. Vegetables Fresh or frozen vegetables. "No salt added" canned vegetables. "No salt added" tomato sauce and paste. Low-sodium or reduced-sodium tomato and vegetable juice. Fruits Fresh, frozen, or canned fruit. Fruit juice. Meats and other protein foods Fresh or frozen (no salt added) meat, poultry, seafood, and fish. Low-sodium canned tuna and salmon. Unsalted nuts. Dried peas, beans, and lentils without added salt. Unsalted canned beans. Eggs. Unsalted nut butters. Dairy Milk. Soy milk. Cheese that is naturally low in sodium, such as ricotta cheese, fresh mozzarella, or Swiss cheese Low-sodium or reduced-sodium cheese. Cream cheese. Yogurt. Fats and oils Unsalted butter. Unsalted margarine with no trans fat. Vegetable oils such as canola or olive oils. Seasonings and other foods Fresh and dried herbs and spices. Salt-free seasonings. Low-sodium mustard and ketchup. Sodium-free salad dressing. Sodium-free light mayonnaise. Fresh or refrigerated horseradish. Lemon juice. Vinegar. Homemade, reduced-sodium, or low-sodium soups. Unsalted popcorn and pretzels. Low-salt or salt-free chips. What foods are not recommended? The items listed may not be a complete list. Talk with your dietitian about what dietary choices are best for you. Grains Instant hot cereals.   Bread stuffing, pancake, and biscuit mixes. Croutons. Seasoned rice or pasta mixes. Noodle soup cups. Boxed or frozen macaroni and cheese. Regular salted crackers.  Self-rising flour. Vegetables Sauerkraut, pickled vegetables, and relishes. Olives. French fries. Onion rings. Regular canned vegetables (not low-sodium or reduced-sodium). Regular canned tomato sauce and paste (not low-sodium or reduced-sodium). Regular tomato and vegetable juice (not low-sodium or reduced-sodium). Frozen vegetables in sauces. Meats and other protein foods Meat or fish that is salted, canned, smoked, spiced, or pickled. Bacon, ham, sausage, hotdogs, corned beef, chipped beef, packaged lunch meats, salt pork, jerky, pickled herring, anchovies, regular canned tuna, sardines, salted nuts. Dairy Processed cheese and cheese spreads. Cheese curds. Blue cheese. Feta cheese. String cheese. Regular cottage cheese. Buttermilk. Canned milk. Fats and oils Salted butter. Regular margarine. Ghee. Bacon fat. Seasonings and other foods Onion salt, garlic salt, seasoned salt, table salt, and sea salt. Canned and packaged gravies. Worcestershire sauce. Tartar sauce. Barbecue sauce. Teriyaki sauce. Soy sauce, including reduced-sodium. Steak sauce. Fish sauce. Oyster sauce. Cocktail sauce. Horseradish that you find on the shelf. Regular ketchup and mustard. Meat flavorings and tenderizers. Bouillon cubes. Hot sauce and Tabasco sauce. Premade or packaged marinades. Premade or packaged taco seasonings. Relishes. Regular salad dressings. Salsa. Potato and tortilla chips. Corn chips and puffs. Salted popcorn and pretzels. Canned or dried soups. Pizza. Frozen entrees and pot pies. Summary  Eating less sodium can help lower your blood pressure, reduce swelling, and protect your heart, liver, and kidneys.  Most people on this plan should limit their sodium intake to 1,500-2,000 mg (milligrams) of sodium each day.  Canned, boxed, and frozen foods are high in sodium. Restaurant foods, fast foods, and pizza are also very high in sodium. You also get sodium by adding salt to food.  Try to cook at home, eat  more fresh fruits and vegetables, and eat less fast food, canned, processed, or prepared foods. This information is not intended to replace advice given to you by your health care provider. Make sure you discuss any questions you have with your health care provider. Document Released: 05/21/2002 Document Revised: 11/22/2016 Document Reviewed: 11/22/2016 Elsevier Interactive Patient Education  2018 [employer]  

## 2018-05-13 IMAGING — CR DG CHEST 2V
1 series · 2 of 2 positions shown · non-contrast
Comparison: Chest radiograph dated 11/18/2015

CLINICAL DATA: 75-year-old female with cough and shortness of
breath

EXAM:
CHEST  2 VIEW

[Series 1: dg chest 2 view · 0.14mm/px · 2 of 2 slices shown]
[im 1/2]
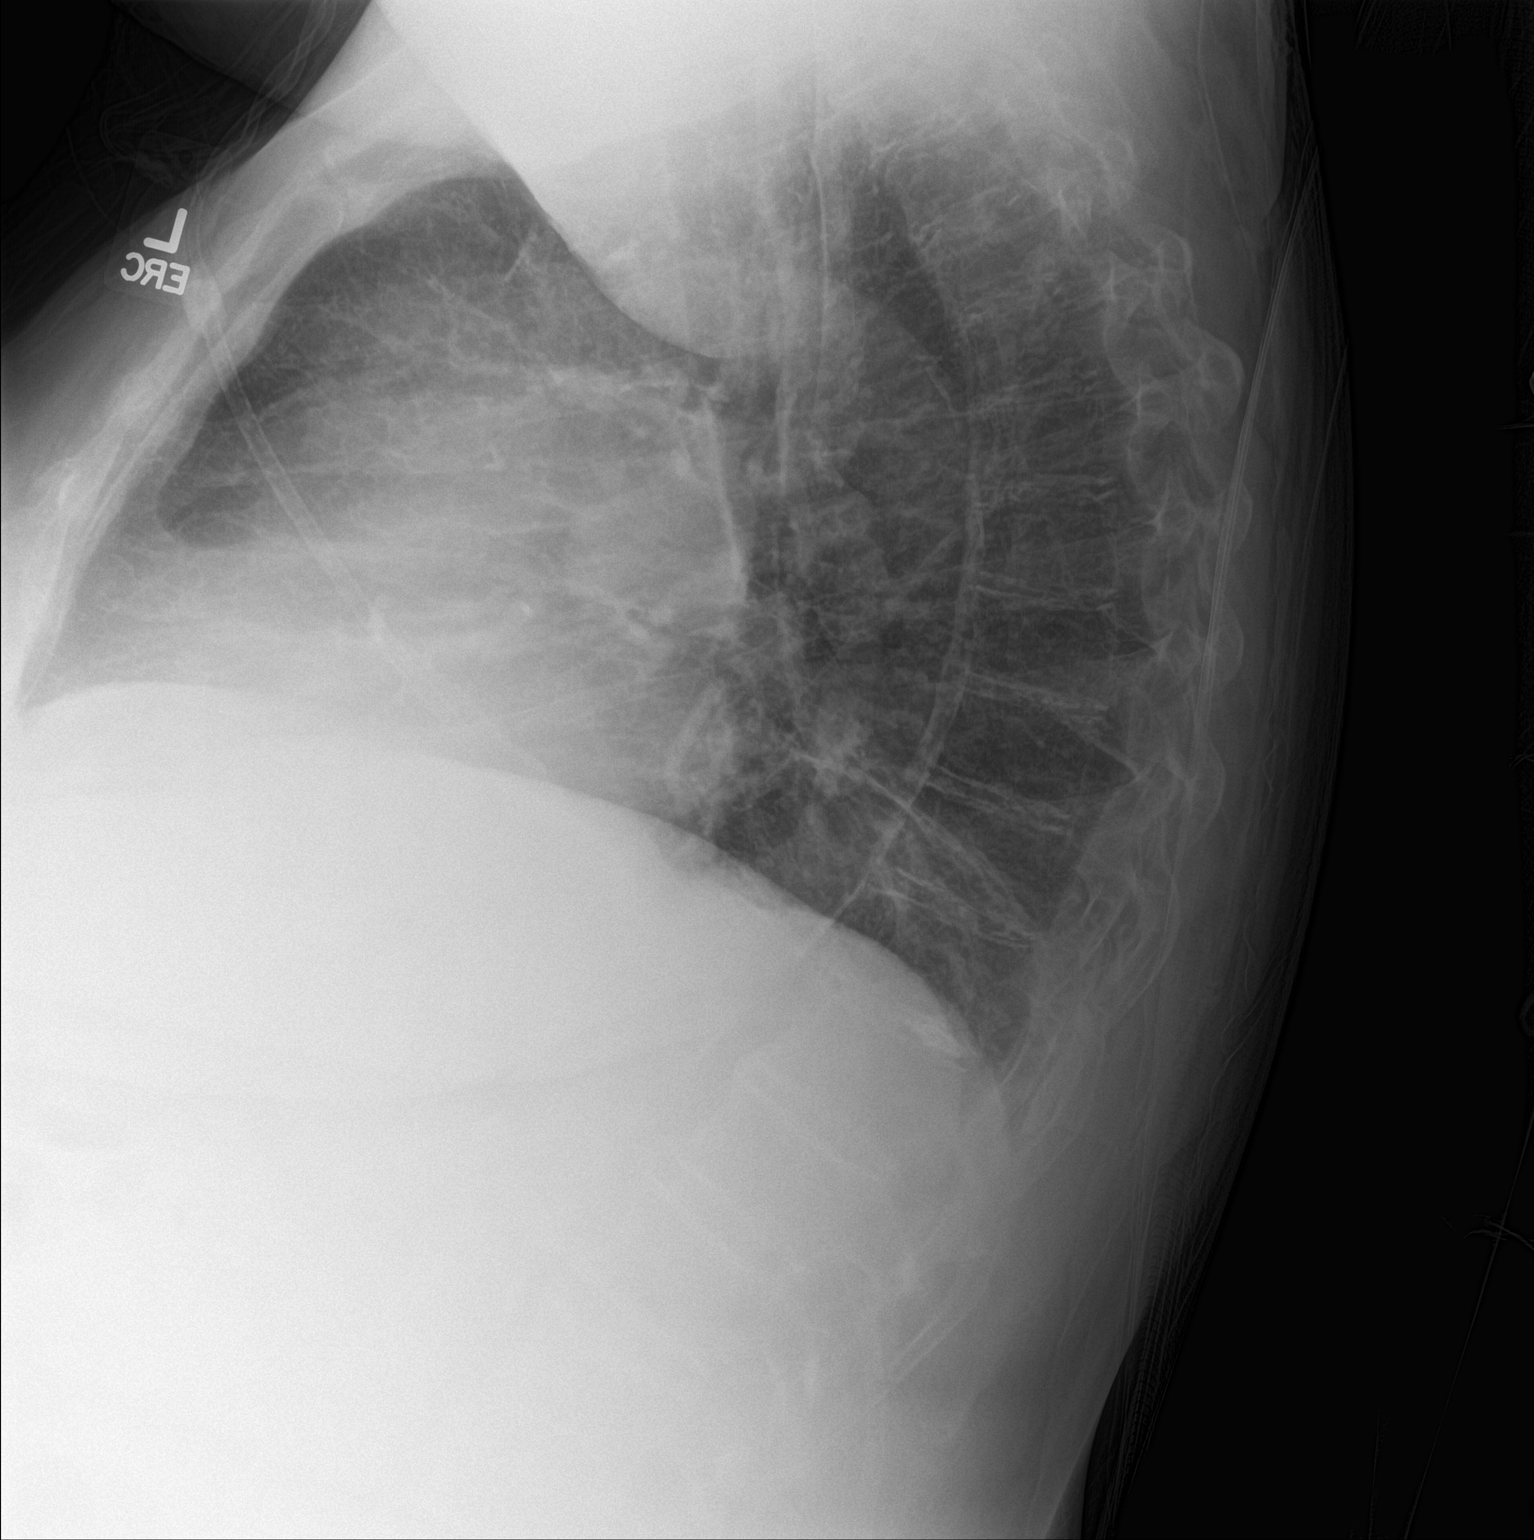
[im 2/2]
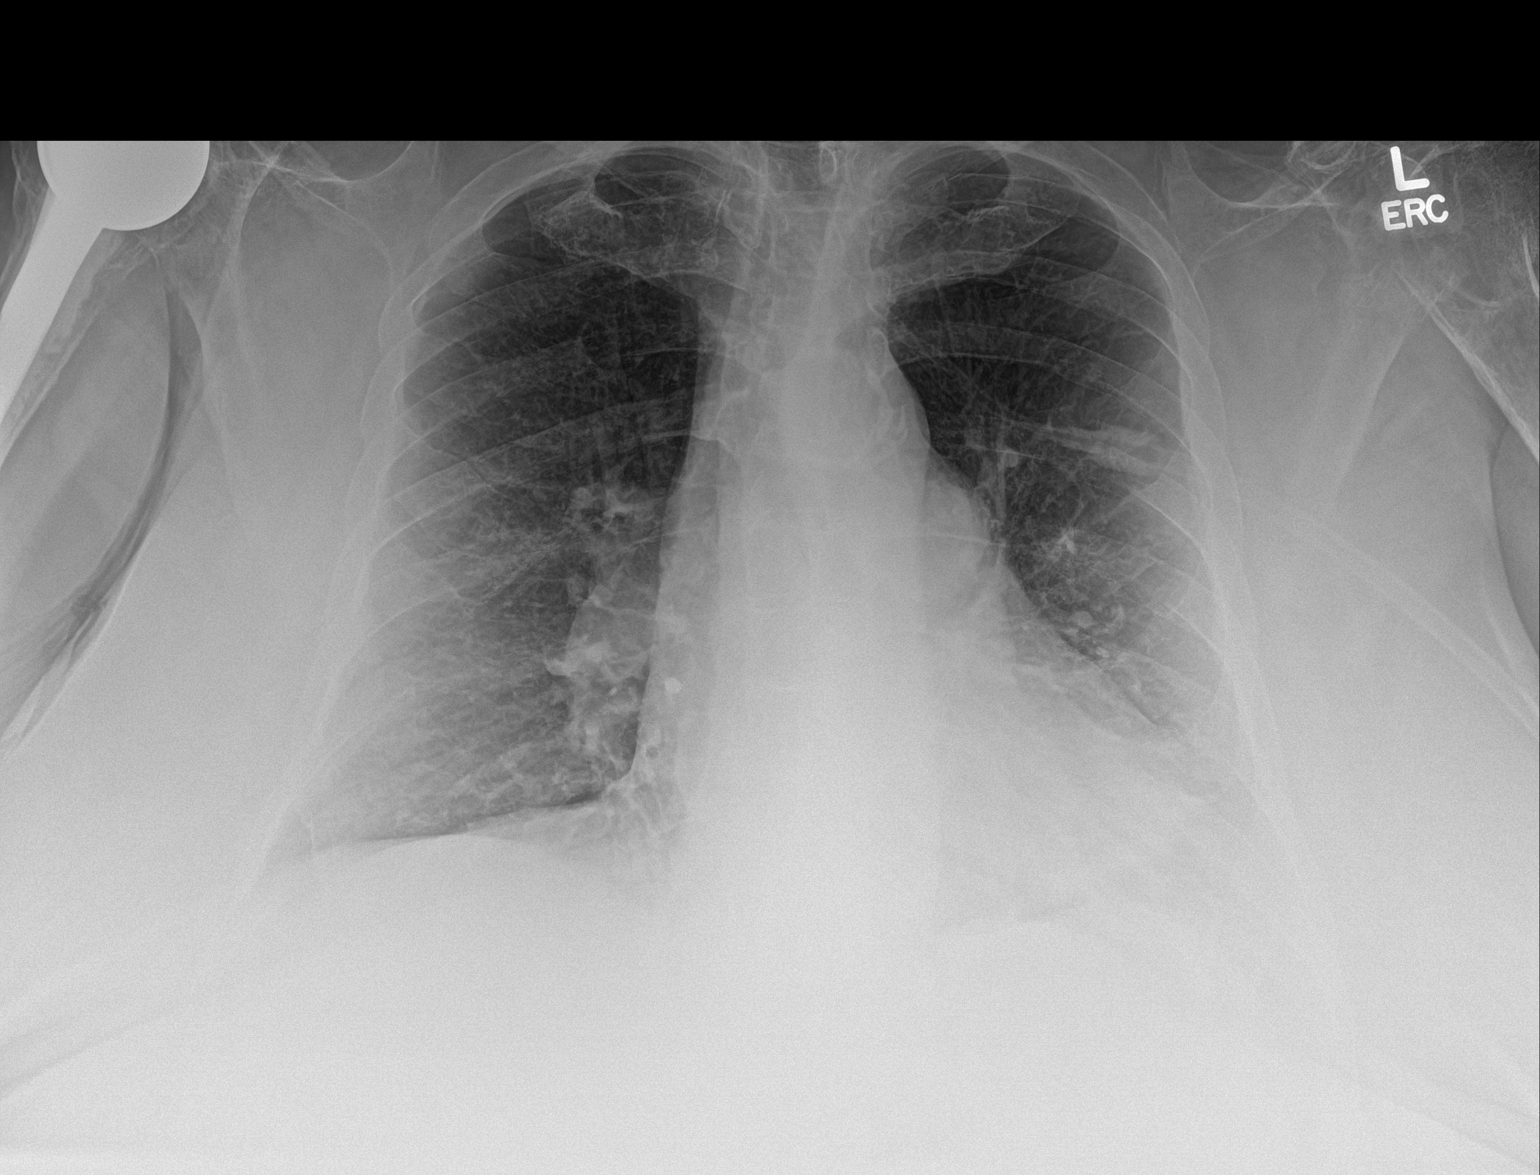

[2 of 2 positions shown; findings below may reference images not displayed]

FINDINGS: Left lung base opacity may represent atelectasis/scarring versus
pneumonia. The right lung is clear. No significant pleural effusion.
There is no pneumothorax. Stable top-normal cardiac size. Osteopenia
with degenerative changes of the spine. Partially visualized old
left humeral neck fracture. Right shoulder hemiarthroplasty. No
acute fracture.
IMPRESSION: Left lung base atelectasis versus infiltrate. Clinical correlation
is recommended.

## 2018-06-02 ENCOUNTER — Other Ambulatory Visit: Payer: Self-pay

## 2018-06-02 ENCOUNTER — Inpatient Hospital Stay
Admission: EM | Admit: 2018-06-02 | Discharge: 2018-06-05 | DRG: 394 | Disposition: A | Discharge status: Home / Self Care | Payer: Medicare Other | Attending: Family Medicine | Admitting: Family Medicine

## 2018-06-02 DIAGNOSIS — Z886 Allergy status to analgesic agent status: Secondary | ICD-10-CM

## 2018-06-02 DIAGNOSIS — N183 Chronic kidney disease, stage 3 (moderate): Secondary | ICD-10-CM | POA: Diagnosis present

## 2018-06-02 DIAGNOSIS — F329 Major depressive disorder, single episode, unspecified: Secondary | ICD-10-CM | POA: Diagnosis present

## 2018-06-02 DIAGNOSIS — N3946 Mixed incontinence: Secondary | ICD-10-CM | POA: Diagnosis present

## 2018-06-02 DIAGNOSIS — Z6835 Body mass index (BMI) 35.0-35.9, adult: Secondary | ICD-10-CM

## 2018-06-02 DIAGNOSIS — J449 Chronic obstructive pulmonary disease, unspecified: Secondary | ICD-10-CM | POA: Diagnosis present

## 2018-06-02 DIAGNOSIS — I8390 Asymptomatic varicose veins of unspecified lower extremity: Secondary | ICD-10-CM | POA: Diagnosis present

## 2018-06-02 DIAGNOSIS — Z8249 Family history of ischemic heart disease and other diseases of the circulatory system: Secondary | ICD-10-CM

## 2018-06-02 DIAGNOSIS — Z7401 Bed confinement status: Secondary | ICD-10-CM

## 2018-06-02 DIAGNOSIS — Z7902 Long term (current) use of antithrombotics/antiplatelets: Secondary | ICD-10-CM

## 2018-06-02 DIAGNOSIS — Z87891 Personal history of nicotine dependence: Secondary | ICD-10-CM

## 2018-06-02 DIAGNOSIS — L03317 Cellulitis of buttock: Secondary | ICD-10-CM | POA: Diagnosis present

## 2018-06-02 DIAGNOSIS — Z79899 Other long term (current) drug therapy: Secondary | ICD-10-CM

## 2018-06-02 DIAGNOSIS — B9562 Methicillin resistant Staphylococcus aureus infection as the cause of diseases classified elsewhere: Secondary | ICD-10-CM | POA: Diagnosis present

## 2018-06-02 DIAGNOSIS — H269 Unspecified cataract: Secondary | ICD-10-CM | POA: Diagnosis present

## 2018-06-02 DIAGNOSIS — L0291 Cutaneous abscess, unspecified: Secondary | ICD-10-CM

## 2018-06-02 DIAGNOSIS — I429 Cardiomyopathy, unspecified: Secondary | ICD-10-CM | POA: Diagnosis present

## 2018-06-02 DIAGNOSIS — I130 Hypertensive heart and chronic kidney disease with heart failure and stage 1 through stage 4 chronic kidney disease, or unspecified chronic kidney disease: Secondary | ICD-10-CM | POA: Diagnosis present

## 2018-06-02 DIAGNOSIS — F419 Anxiety disorder, unspecified: Secondary | ICD-10-CM | POA: Diagnosis present

## 2018-06-02 DIAGNOSIS — R350 Frequency of micturition: Secondary | ICD-10-CM | POA: Diagnosis present

## 2018-06-02 DIAGNOSIS — N952 Postmenopausal atrophic vaginitis: Secondary | ICD-10-CM | POA: Diagnosis present

## 2018-06-02 DIAGNOSIS — I252 Old myocardial infarction: Secondary | ICD-10-CM

## 2018-06-02 DIAGNOSIS — E669 Obesity, unspecified: Secondary | ICD-10-CM | POA: Diagnosis present

## 2018-06-02 DIAGNOSIS — Z885 Allergy status to narcotic agent status: Secondary | ICD-10-CM

## 2018-06-02 DIAGNOSIS — K5909 Other constipation: Secondary | ICD-10-CM | POA: Diagnosis present

## 2018-06-02 DIAGNOSIS — I5032 Chronic diastolic (congestive) heart failure: Secondary | ICD-10-CM | POA: Diagnosis present

## 2018-06-02 DIAGNOSIS — K611 Rectal abscess: Principal | ICD-10-CM | POA: Diagnosis present

## 2018-06-02 DIAGNOSIS — Z888 Allergy status to other drugs, medicaments and biological substances status: Secondary | ICD-10-CM

## 2018-06-02 DIAGNOSIS — M1990 Unspecified osteoarthritis, unspecified site: Secondary | ICD-10-CM | POA: Diagnosis present

## 2018-06-02 DIAGNOSIS — Z8349 Family history of other endocrine, nutritional and metabolic diseases: Secondary | ICD-10-CM

## 2018-06-02 DIAGNOSIS — E1122 Type 2 diabetes mellitus with diabetic chronic kidney disease: Secondary | ICD-10-CM | POA: Diagnosis present

## 2018-06-02 DIAGNOSIS — L0231 Cutaneous abscess of buttock: Secondary | ICD-10-CM | POA: Diagnosis present

## 2018-06-02 DIAGNOSIS — G4730 Sleep apnea, unspecified: Secondary | ICD-10-CM | POA: Diagnosis present

## 2018-06-02 DIAGNOSIS — Z7951 Long term (current) use of inhaled steroids: Secondary | ICD-10-CM

## 2018-06-02 DIAGNOSIS — Z993 Dependence on wheelchair: Secondary | ICD-10-CM

## 2018-06-02 DIAGNOSIS — J9611 Chronic respiratory failure with hypoxia: Secondary | ICD-10-CM | POA: Diagnosis present

## 2018-06-02 DIAGNOSIS — Z9981 Dependence on supplemental oxygen: Secondary | ICD-10-CM

## 2018-06-02 DIAGNOSIS — Z91048 Other nonmedicinal substance allergy status: Secondary | ICD-10-CM

## 2018-06-02 DIAGNOSIS — Z8673 Personal history of transient ischemic attack (TIA), and cerebral infarction without residual deficits: Secondary | ICD-10-CM | POA: Diagnosis not present

## 2018-06-02 LAB — CBC WITH DIFFERENTIAL/PLATELET
BASOS ABS: 0 10*3/uL (ref 0–0.1)
Basophils Relative: 0 %
EOS ABS: 0.1 10*3/uL (ref 0–0.7)
EOS PCT: 1 %
HCT: 31.1 % — ABNORMAL LOW (ref 35.0–47.0)
Hemoglobin: 10.7 g/dL — ABNORMAL LOW (ref 12.0–16.0)
Lymphocytes Relative: 7 %
Lymphs Abs: 0.9 10*3/uL — ABNORMAL LOW (ref 1.0–3.6)
MCH: 31.7 pg (ref 26.0–34.0)
MCHC: 34.2 g/dL (ref 32.0–36.0)
MCV: 92.8 fL (ref 80.0–100.0)
Monocytes Absolute: 0.6 10*3/uL (ref 0.2–0.9)
Monocytes Relative: 5 %
Neutro Abs: 10.8 10*3/uL — ABNORMAL HIGH (ref 1.4–6.5)
Neutrophils Relative %: 87 %
PLATELETS: 241 10*3/uL (ref 150–440)
RBC: 3.36 MIL/uL — ABNORMAL LOW (ref 3.80–5.20)
RDW: 15.7 % — ABNORMAL HIGH (ref 11.5–14.5)
WBC: 12.4 10*3/uL — AB (ref 3.6–11.0)

## 2018-06-02 LAB — COMPREHENSIVE METABOLIC PANEL
ALT: 9 U/L — ABNORMAL LOW (ref 14–54)
AST: 12 U/L — ABNORMAL LOW (ref 15–41)
Albumin: 3.1 g/dL — ABNORMAL LOW (ref 3.5–5.0)
Alkaline Phosphatase: 72 U/L (ref 38–126)
Anion gap: 9 (ref 5–15)
BUN: 12 mg/dL (ref 6–20)
CHLORIDE: 102 mmol/L (ref 101–111)
CO2: 25 mmol/L (ref 22–32)
CREATININE: 0.67 mg/dL (ref 0.44–1.00)
Calcium: 9 mg/dL (ref 8.9–10.3)
GFR calc non Af Amer: >60 mL/min (ref >60)
Glucose, Bld: 116 mg/dL — ABNORMAL HIGH (ref 65–99)
POTASSIUM: 3.8 mmol/L (ref 3.5–5.1)
SODIUM: 136 mmol/L (ref 135–145)
Total Bilirubin: 0.8 mg/dL (ref 0.3–1.2)
Total Protein: 6.4 g/dL — ABNORMAL LOW (ref 6.5–8.1)

## 2018-06-02 LAB — MRSA PCR SCREENING: MRSA by PCR: POSITIVE — AB

## 2018-06-02 MED ORDER — BUDESONIDE 0.5 MG/2ML IN SUSP
0.5000 mg | Freq: Two times a day (BID) | RESPIRATORY_TRACT | Status: DC
  Administered 2018-06-02 – 2018-06-05 (×6): 0.5 mg via RESPIRATORY_TRACT
  Filled 2018-06-02 (×5): qty 2

## 2018-06-02 MED ORDER — CLOPIDOGREL BISULFATE 75 MG PO TABS
75.0000 mg | ORAL_TABLET | Freq: Every day | ORAL | Status: DC
  Administered 2018-06-02 – 2018-06-05 (×4): 75 mg via ORAL
  Filled 2018-06-02 (×4): qty 1

## 2018-06-02 MED ORDER — GUAIFENESIN ER 600 MG PO TB12
600.0000 mg | ORAL_TABLET | Freq: Two times a day (BID) | ORAL | Status: DC
  Administered 2018-06-02 – 2018-06-05 (×6): 600 mg via ORAL
  Filled 2018-06-02 (×6): qty 1

## 2018-06-02 MED ORDER — LINAGLIPTIN 5 MG PO TABS
5.0000 mg | ORAL_TABLET | Freq: Every day | ORAL | Status: DC
  Administered 2018-06-02 – 2018-06-05 (×4): 5 mg via ORAL
  Filled 2018-06-02 (×4): qty 1

## 2018-06-02 MED ORDER — SODIUM CHLORIDE 0.9% FLUSH
3.0000 mL | Freq: Two times a day (BID) | INTRAVENOUS | Status: DC
  Administered 2018-06-02 – 2018-06-05 (×7): 3 mL via INTRAVENOUS

## 2018-06-02 MED ORDER — VANCOMYCIN HCL IN DEXTROSE 1-5 GM/200ML-% IV SOLN
1000.0000 mg | Freq: Once | INTRAVENOUS | Status: AC
  Administered 2018-06-02: 1000 mg via INTRAVENOUS
  Filled 2018-06-02: qty 200

## 2018-06-02 MED ORDER — ONDANSETRON HCL 4 MG PO TABS: 4.0000 mg | ORAL_TABLET | Freq: Four times a day (QID) | ORAL | Status: DC | PRN

## 2018-06-02 MED ORDER — BISACODYL 5 MG PO TBEC: 5.0000 mg | DELAYED_RELEASE_TABLET | Freq: Every day | ORAL | Status: DC | PRN

## 2018-06-02 MED ORDER — SODIUM CHLORIDE 0.9% FLUSH: 3.0000 mL | INTRAVENOUS | Status: DC | PRN

## 2018-06-02 MED ORDER — GUAIFENESIN ER 600 MG PO TB12
600.0000 mg | ORAL_TABLET | Freq: Two times a day (BID) | ORAL | Status: DC
  Filled 2018-06-02: qty 1

## 2018-06-02 MED ORDER — MORPHINE SULFATE (PF) 4 MG/ML IV SOLN
INTRAVENOUS | Status: AC
  Administered 2018-06-02: 4 mg via INTRAVENOUS
  Filled 2018-06-02: qty 1

## 2018-06-02 MED ORDER — SODIUM CHLORIDE 0.9 % IV SOLN: 250.0000 mL | INTRAVENOUS | Status: DC | PRN

## 2018-06-02 MED ORDER — ACETAMINOPHEN 325 MG PO TABS: 650.0000 mg | ORAL_TABLET | Freq: Four times a day (QID) | ORAL | Status: DC | PRN

## 2018-06-02 MED ORDER — MONTELUKAST SODIUM 10 MG PO TABS
10.0000 mg | ORAL_TABLET | Freq: Every day | ORAL | Status: DC
  Administered 2018-06-02 – 2018-06-04 (×3): 10 mg via ORAL
  Filled 2018-06-02 (×3): qty 1

## 2018-06-02 MED ORDER — MORPHINE SULFATE (PF) 2 MG/ML IV SOLN: 2.0000 mg | INTRAVENOUS | Status: DC | PRN

## 2018-06-02 MED ORDER — DEXTROMETHORPHAN POLISTIREX ER 30 MG/5ML PO SUER
30.0000 mg | Freq: Two times a day (BID) | ORAL | Status: DC
  Filled 2018-06-02 (×2): qty 5

## 2018-06-02 MED ORDER — POTASSIUM CHLORIDE CRYS ER 10 MEQ PO TBCR
10.0000 meq | EXTENDED_RELEASE_TABLET | Freq: Two times a day (BID) | ORAL | Status: DC
  Administered 2018-06-02 – 2018-06-05 (×7): 10 meq via ORAL
  Filled 2018-06-02 (×7): qty 1

## 2018-06-02 MED ORDER — SIMVASTATIN 40 MG PO TABS
40.0000 mg | ORAL_TABLET | Freq: Every day | ORAL | Status: DC
  Administered 2018-06-02 – 2018-06-04 (×3): 40 mg via ORAL
  Filled 2018-06-02 (×4): qty 1

## 2018-06-02 MED ORDER — MORPHINE SULFATE (PF) 4 MG/ML IV SOLN
4.0000 mg | Freq: Once | INTRAVENOUS | Status: AC
  Administered 2018-06-02: 4 mg via INTRAVENOUS

## 2018-06-02 MED ORDER — FUROSEMIDE 20 MG PO TABS
20.0000 mg | ORAL_TABLET | Freq: Two times a day (BID) | ORAL | Status: DC
  Administered 2018-06-02 – 2018-06-05 (×6): 20 mg via ORAL
  Filled 2018-06-02 (×6): qty 1

## 2018-06-02 MED ORDER — MIRTAZAPINE 15 MG PO TABS
7.5000 mg | ORAL_TABLET | Freq: Every day | ORAL | Status: DC
  Administered 2018-06-02 – 2018-06-04 (×3): 7.5 mg via ORAL
  Filled 2018-06-02 (×3): qty 1

## 2018-06-02 MED ORDER — DOCUSATE SODIUM 100 MG PO CAPS
100.0000 mg | ORAL_CAPSULE | Freq: Two times a day (BID) | ORAL | Status: DC
  Administered 2018-06-02 – 2018-06-04 (×4): 100 mg via ORAL
  Filled 2018-06-02 (×4): qty 1

## 2018-06-02 MED ORDER — TIZANIDINE HCL 2 MG PO TABS
2.0000 mg | ORAL_TABLET | Freq: Four times a day (QID) | ORAL | Status: DC | PRN
  Administered 2018-06-03 – 2018-06-04 (×3): 2 mg via ORAL
  Filled 2018-06-02 (×4): qty 1

## 2018-06-02 MED ORDER — BUPROPION HCL ER (XL) 150 MG PO TB24
150.0000 mg | ORAL_TABLET | Freq: Every day | ORAL | Status: DC
  Administered 2018-06-02 – 2018-06-05 (×4): 150 mg via ORAL
  Filled 2018-06-02 (×4): qty 1

## 2018-06-02 MED ORDER — TRAMADOL HCL 50 MG PO TABS
50.0000 mg | ORAL_TABLET | Freq: Four times a day (QID) | ORAL | Status: DC | PRN
  Administered 2018-06-02: 50 mg via ORAL
  Filled 2018-06-02: qty 1

## 2018-06-02 MED ORDER — FLUOXETINE HCL 20 MG PO CAPS
80.0000 mg | ORAL_CAPSULE | Freq: Every day | ORAL | Status: DC
  Administered 2018-06-02 – 2018-06-05 (×4): 80 mg via ORAL
  Filled 2018-06-02 (×4): qty 4

## 2018-06-02 MED ORDER — ACETAMINOPHEN 650 MG RE SUPP: 650.0000 mg | Freq: Four times a day (QID) | RECTAL | Status: DC | PRN

## 2018-06-02 MED ORDER — ONDANSETRON HCL 4 MG/2ML IJ SOLN: 4.0000 mg | Freq: Four times a day (QID) | INTRAMUSCULAR | Status: DC | PRN

## 2018-06-02 MED ORDER — VANCOMYCIN HCL IN DEXTROSE 1-5 GM/200ML-% IV SOLN
1000.0000 mg | Freq: Two times a day (BID) | INTRAVENOUS | Status: DC
  Administered 2018-06-02 – 2018-06-05 (×6): 1000 mg via INTRAVENOUS
  Filled 2018-06-02 (×8): qty 200

## 2018-06-02 MED ORDER — LIOTHYRONINE SODIUM 25 MCG PO TABS
25.0000 ug | ORAL_TABLET | Freq: Every day | ORAL | Status: DC
  Administered 2018-06-02 – 2018-06-05 (×4): 25 ug via ORAL
  Filled 2018-06-02 (×4): qty 1

## 2018-06-02 MED ORDER — DEXTROMETHORPHAN POLISTIREX ER 30 MG/5ML PO SUER
30.0000 mg | Freq: Two times a day (BID) | ORAL | Status: DC
  Administered 2018-06-02 – 2018-06-05 (×6): 30 mg via ORAL
  Filled 2018-06-02 (×7): qty 5

## 2018-06-02 MED ORDER — PANTOPRAZOLE SODIUM 40 MG PO TBEC
40.0000 mg | DELAYED_RELEASE_TABLET | Freq: Every day | ORAL | Status: DC
  Administered 2018-06-02 – 2018-06-05 (×4): 40 mg via ORAL
  Filled 2018-06-02 (×4): qty 1

## 2018-06-02 MED ORDER — LIDOCAINE-EPINEPHRINE (PF) 1 %-1:200000 IJ SOLN
30.0000 mL | Freq: Once | INTRAMUSCULAR | Status: AC
  Administered 2018-06-02: 30 mL
  Filled 2018-06-02 (×2): qty 30

## 2018-06-02 MED ORDER — CLONAZEPAM 1 MG PO TABS
1.0000 mg | ORAL_TABLET | Freq: Two times a day (BID) | ORAL | Status: DC
  Administered 2018-06-02 – 2018-06-05 (×6): 1 mg via ORAL
  Filled 2018-06-02 (×6): qty 1

## 2018-06-02 MED ORDER — ARIPIPRAZOLE 5 MG PO TABS
5.0000 mg | ORAL_TABLET | Freq: Every day | ORAL | Status: DC
  Administered 2018-06-02 – 2018-06-04 (×3): 5 mg via ORAL
  Filled 2018-06-02 (×4): qty 1

## 2018-06-02 MED ORDER — GABAPENTIN 100 MG PO CAPS
200.0000 mg | ORAL_CAPSULE | Freq: Three times a day (TID) | ORAL | Status: DC
  Administered 2018-06-02 – 2018-06-05 (×9): 200 mg via ORAL
  Filled 2018-06-02 (×9): qty 2

## 2018-06-02 MED ORDER — BUDESONIDE 0.5 MG/2ML IN SUSP
0.5000 mg | Freq: Two times a day (BID) | RESPIRATORY_TRACT | Status: DC
  Filled 2018-06-02: qty 2

## 2018-06-02 MED ORDER — ENOXAPARIN SODIUM 40 MG/0.4ML ~~LOC~~ SOLN
40.0000 mg | SUBCUTANEOUS | Status: DC
  Administered 2018-06-02 – 2018-06-04 (×3): 40 mg via SUBCUTANEOUS
  Filled 2018-06-02 (×3): qty 0.4

## 2018-06-02 NOTE — ED Notes (Signed)
Report to Terry, RN

## 2018-06-02 NOTE — ED Notes (Signed)
Left buttocks has abscess appearing area to it, reddened around area. Pus draining from area.

## 2018-06-02 NOTE — H&P (Signed)
[facility] NAME: Sara Pierce    MR#:  [id number]  DATE OF BIRTH:  [birth year]  DATE OF ADMISSION:  06/02/2018  PRIMARY CARE PHYSICIAN: Marygrace Drought, MD   REQUESTING/REFERRING PHYSICIAN:   CHIEF COMPLAINT:   Chief Complaint  Patient presents with  . Wound Infection    HISTORY OF PRESENT ILLNESS: Sara Pierce  is a 77 y.o. female with a known history of cardiomyopathy, congestive heart failure, chronic kidney disease, COPD, diabetes mellitus type 2, hypertension, arthritis, sleep apnea presented to the emergency room with discharge from the buttock area.  Patient came evaluation for pain and discharge in the left buttock area.  She was seen in the emergency room she was found to have abscess in the left buttock perirectal area.  Incision and drainage was done in the emergency room and patient was started on IV vancomycin antibiotic.  Patient is usually bedbound she moves with the help of wheelchair.  Patient gets onto the wheelchair with the help of a Hoyer lift at home.  No complaints of any chest pain, shortness of breath.  On oxygen via nasal cannula at 3 L at home.  Hospitalist service was consulted for further care.  PAST MEDICAL HISTORY:   Past Medical History:  Diagnosis Date  . Arthritis   . Cardiomyopathy ([place])   . Cataract   . Cerebral hemorrhage ([place])   . CHF (congestive heart failure) ([place])   . Chronic kidney disease   . Clotting disorder ([place])   . COPD (chronic obstructive pulmonary disease) ([place])   . Depression   . Diabetes mellitus without complication ([place])   . Hypertension   . Mixed incontinence   . Obesity   . Sleep apnea   . Thyroid disease   . Urinary frequency   . Vaginal atrophy   . Varicose veins     PAST SURGICAL HISTORY:  Past Surgical History:  Procedure Laterality Date  . APPENDECTOMY    . Cardiac Catherization    . COLONOSCOPY WITH PROPOFOL N/A 04/28/2016   Procedure: COLONOSCOPY  WITH PROPOFOL;  Surgeon: Manya Silvas, MD;  Location: [facility] END[facility]COPY;  Service: Endoscopy;  Laterality: N/A;  . COLONOSCOPY WITH PROPOFOL N/A 04/29/2016   Procedure: COLONOSCOPY WITH PROPOFOL;  Surgeon: Manya Silvas, MD;  Location: [facility] ENDOSCOPY;  Service: Endoscopy;  Laterality: N/A;  . JOINT REPLACEMENT    . STRABISMUS SURGERY    . TONSILLECTOMY    . TUBAL LIGATION      SOCIAL HISTORY:  Social History   Tobacco Use  . Smoking status: Former Smoker    Packs/day: 0.50    Years: 44.00    Pack years: 22.00    Types: Cigarettes  . Smokeless tobacco: Never Used  Substance Use Topics  . Alcohol use: No    FAMILY HISTORY:  Family History  Problem Relation Age of Onset  . Heart attack Father   . Coronary artery disease Father   . Hypertension Father   . Hyperlipidemia Father   . Heart attack Mother   . Hypertension Mother   . Hyperlipidemia Mother   . Breast cancer Neg Hx     DRUG ALLERGIES:  Allergies  Allergen Reactions  . Aspirin Swelling  . Codeine Itching  . Naprosyn [Naproxen] Other (See Comments)    Per MAR pt allergic  . Other     Elastic Bandages/ Supports Per MAR  . Tape Hives  . Valacyclovir  REVIEW OF SYSTEMS:   CONSTITUTIONAL: No fever, fatigue or weakness.  EYES: No blurred or double vision.  EARS, NOSE, AND THROAT: No tinnitus or ear pain.  RESPIRATORY: No cough, shortness of breath, wheezing or hemoptysis.  CARDIOVASCULAR: No chest pain, orthopnea, edema.  GASTROINTESTINAL: No nausea, vomiting, diarrhea or abdominal pain.  GENITOURINARY: No dysuria, hematuria.  ENDOCRINE: No polyuria, nocturia,  HEMATOLOGY: No anemia, easy bruising or bleeding SKIN: redness and skin break down buttock area MUSCULOSKELETAL: No joint pain or arthritis.   NEUROLOGIC: No tingling, numbness, weakness.  PSYCHIATRY: No anxiety or depression.   MEDICATIONS AT HOME:  Prior to Admission medications   Medication Sig Start Date End Date Taking? Authorizing  Provider  albuterol (PROVENTIL) (2.5 MG/3ML) 0.083% nebulizer solution Inhale 3 mLs into the lungs every 6 (six) hours as needed for wheezing or shortness of breath. 04/22/18  Yes Gouru, Aruna, MD  ARIPiprazole (ABILIFY) 5 MG tablet Take 5 mg by mouth at bedtime.   Yes [provider]  budesonide (PULMICORT) 0.5 MG/2ML nebulizer solution Take 2 mLs (0.5 mg total) 2 (two) times daily by nebulization. 10/28/17  Yes Demetrios Loll, MD  buPROPion (WELLBUTRIN XL) 150 MG 24 hr tablet Take 150 mg by mouth daily.   Yes [provider]  clopidogrel (PLAVIX) 75 MG tablet Take 1 tablet (75 mg total) by mouth daily. 04/23/18  Yes Gouru, Illene Silver, MD  dextromethorphan (DELSYM) 30 MG/5ML liquid Take 5 mLs (30 mg total) by mouth 2 (two) times daily. 04/22/18  Yes Gouru, Illene Silver, MD  FLUoxetine (PROZAC) 40 MG capsule Take 80 mg by mouth daily.   Yes [provider]  fluticasone (FLONASE) 50 MCG/ACT nasal spray Place 2 sprays into both nostrils daily.   Yes [provider]  furosemide (LASIX) 20 MG tablet Take 1 tablet (20 mg total) by mouth 2 (two) times daily. 04/22/18  Yes Gouru, Illene Silver, MD  gabapentin (NEURONTIN) 100 MG capsule Take 2 capsules (200 mg total) by mouth 3 (three) times daily. 04/22/18  Yes Gouru, Illene Silver, MD  guaiFENesin (MUCINEX) 600 MG 12 hr tablet Take 1 tablet (600 mg total) by mouth 2 (two) times daily. 04/22/18  Yes Gouru, Aruna, MD  ipratropium-albuterol (DUONEB) 0.5-2.5 (3) MG/3ML SOLN Take 3 mLs by nebulization 4 (four) times daily.   Yes [provider]  liothyronine (CYTOMEL) 25 MCG tablet Take 25 mcg by mouth daily.    Yes [provider]  mirtazapine (REMERON) 15 MG tablet Take 7.5 mg by mouth at bedtime.   Yes [provider]  montelukast (SINGULAIR) 10 MG tablet Take 10 mg by mouth at bedtime.    Yes [provider]  omeprazole (PRILOSEC) 20 MG capsule Take 20 mg by mouth daily.   Yes [provider]  polyethylene glycol  (MIRALAX / GLYCOLAX) packet Take 17 g by mouth daily.   Yes [provider]  potassium chloride (K-DUR) 10 MEQ tablet Take 10 mEq by mouth 2 (two) times daily.    Yes [provider]  simvastatin (ZOCOR) 40 MG tablet Take 40 mg by mouth at bedtime.    Yes [provider]  tiotropium (SPIRIVA) 18 MCG inhalation capsule Place 18 mcg into inhaler and inhale daily.   Yes [provider]  tiZANidine (ZANAFLEX) 2 MG tablet Take 1 tablet (2 mg total) by mouth every 6 (six) hours as needed for muscle spasms. 04/22/18  Yes Gouru, Aruna, MD  umeclidinium-vilanterol (ANORO ELLIPTA) 62.5-25 MCG/INH AEPB Inhale 1 puff into the lungs daily.  Yes [provider]  Vitamin D, Ergocalciferol, (DRISDOL) 50000 units CAPS capsule Take 50,000 Units by mouth every 30 (thirty) days.   Yes [provider]  acetaminophen (TYLENOL) 325 MG tablet Take 2 tablets (650 mg total) by mouth every 6 (six) hours as needed for mild pain (or Fever >/= 101). Patient not taking: Reported on 06/02/2018 04/22/18   Nicholes Mango, MD  clonazePAM (KLONOPIN) 1 MG tablet Take 1 tablet (1 mg total) by mouth 2 (two) times daily. Patient not taking: Reported on 06/02/2018 04/22/18   Nicholes Mango, MD  docusate sodium (COLACE) 100 MG capsule Take 1 capsule (100 mg total) by mouth 2 (two) times daily. Patient not taking: Reported on 06/02/2018 04/22/18   Nicholes Mango, MD  traMADol (ULTRAM) 50 MG tablet Take 1 tablet (50 mg total) by mouth every 6 (six) hours as needed for moderate pain. Patient not taking: Reported on 06/02/2018 04/22/18   Nicholes Mango, MD      PHYSICAL EXAMINATION:   VITAL SIGNS: Blood pressure 104/75, pulse 86, temperature 98 F (36.7 C), temperature source Oral, resp. rate 16, height 5\' 5"  (1.651 m), weight 95.3 kg (210 lb), SpO2 96 %.  GENERAL:  77 y.o.-year-old patient lying in the bed with no acute distress.  EYES: Pupils equal, round, reactive to light and accommodation. No  scleral icterus. Extraocular muscles intact.  HEENT: Head atraumatic, normocephalic. Oropharynx and nasopharynx clear.  NECK:  Supple, no jugular venous distention. No thyroid enlargement, no tenderness.  LUNGS: Normal breath sounds bilaterally, no wheezing, rales,rhonchi or crepitation. No use of accessory muscles of respiration.  CARDIOVASCULAR: S1, S2 normal. No murmurs, rubs, or gallops.  ABDOMEN: Soft, nontender, nondistended. Bowel sounds present. No organomegaly or mass.  EXTREMITIES: No pedal edema, cyanosis, or clubbing.  NEUROLOGIC: Cranial nerves II through XII are intact. Muscle strength 5/5 in all extremities. Sensation intact. Gait not checked.  PSYCHIATRIC: The patient is alert and oriented x 3.  SKIN: redness, swelling left buttock Bandage noted  LABORATORY PANEL:   CBC Recent Labs  Lab 06/02/18 1146  WBC 12.4*  HGB 10.7*  HCT 31.1*  PLT 241  MCV 92.8  MCH 31.7  MCHC 34.2  RDW 15.7*  LYMPHSABS 0.9*  MONOABS 0.6  EOSABS 0.1  BASOSABS 0.0   ------------------------------------------------------------------------------------------------------------------  Chemistries  Recent Labs  Lab 06/02/18 1146  NA 136  K 3.8  CL 102  CO2 25  GLUCOSE 116*  BUN 12  CREATININE 0.67  CALCIUM 9.0  AST 12*  ALT 9*  ALKPHOS 72  BILITOT 0.8   ------------------------------------------------------------------------------------------------------------------ estimated creatinine clearance is 69.4 mL/min (by C-G formula based on SCr of 0.67 mg/dL). ------------------------------------------------------------------------------------------------------------------ No results for input(s): TSH, T4TOTAL, T3FREE, THYROIDAB in the last 72 hours.  Invalid input(s): FREET3   Coagulation profile No results for input(s): INR, PROTIME in the last 168 hours. ------------------------------------------------------------------------------------------------------------------- No  results for input(s): DDIMER in the last 72 hours. -------------------------------------------------------------------------------------------------------------------  Cardiac Enzymes No results for input(s): CKMB, TROPONINI, MYOGLOBIN in the last 168 hours.  Invalid input(s): CK ------------------------------------------------------------------------------------------------------------------ Invalid input(s): POCBNP  ---------------------------------------------------------------------------------------------------------------  Urinalysis    Component Value Date/Time   COLORURINE YELLOW (A) 04/15/2018 0506   APPEARANCEUR HAZY (A) 04/15/2018 0506   APPEARANCEUR Clear 05/19/2014 2245   LABSPEC 1.011 04/15/2018 0506   LABSPEC 1.008 05/19/2014 2245   PHURINE 7.0 04/15/2018 0506   GLUCOSEU NEGATIVE 04/15/2018 0506   GLUCOSEU Negative 05/19/2014 2245   HGBUR NEGATIVE 04/15/2018 0506   BILIRUBINUR NEGATIVE 04/15/2018 0506   BILIRUBINUR  Negative 05/19/2014 2245   KETONESUR NEGATIVE 04/15/2018 0506   PROTEINUR NEGATIVE 04/15/2018 0506   UROBILINOGEN 1.0 03/05/2011 1312   NITRITE NEGATIVE 04/15/2018 0506   LEUKOCYTESUR LARGE (A) 04/15/2018 0506   LEUKOCYTESUR Negative 05/19/2014 2245     RADIOLOGY: No results found.  EKG: Orders placed or performed during the hospital encounter of 04/15/18  . EKG 12-Lead  . EKG 12-Lead  . ED EKG  . ED EKG  . EKG 12-Lead  . EKG 12-Lead  . EKG 12-Lead  . EKG 12-Lead  . EKG 12-Lead  . EKG 12-Lead  . EKG 12-Lead  . EKG 12-Lead  . EKG 12-Lead  . EKG 12-Lead  . EKG 12-Lead  . EKG 12-Lead  . EKG    IMPRESSION AND PLAN:  77 year old elderly female patient with history of congestive heart failure, cardiomyopathy, chronic kidney disease, COPD, diabetes mellitus type 2, hypertension, sleep apnea presented to the emergency room for left buttock swelling and infection.  -Perirectal abscess   status post incision drainage in the emergency  room IV vancomycin antibiotic daily Follow-up cultures Wound care consultation  -Leukocytosis secondary to perirectal abscess  Monitor WBC count  -COPD Continue oxygen via nasal canula at 3 liter  - DVT prophylaxis with [place] lovenox daily  All the records are reviewed and case discussed with ED provider. Management plans discussed with the patient, family and they are in agreement.  CODE STATUS:Full code Code Status History    Date Active Date Inactive Code Status Order ID Comments User Context   04/15/2018 0831 04/22/2018 2121 Full Code [id number]  Harrie Foreman, MD Inpatient   03/23/2018 1813 03/27/2018 2027 Full Code [id number]  Henreitta Leber, MD Inpatient   01/04/2018 1228 01/05/2018 2024 Full Code [id number]  Bettey Costa, MD Inpatient   10/24/2017 0523 10/28/2017 1601 Full Code [id number]  Saundra Shelling, MD Inpatient   08/20/2017 0134 08/21/2017 1828 Full Code [id number]  Lance Coon, MD Inpatient   10/03/2016 0540 10/04/2016 1711 Full Code [id number]  Harrie Foreman, MD Inpatient   11/15/2015 2223 11/19/2015 1514 Full Code [id number]  Hower, Aaron Mose, MD ED       TOTAL TIME TAKING CARE OF THIS PATIENT: 52 minutes.    Saundra Shelling M.D on 06/02/2018 at 2:16 PM  Between 7am to 6pm - Pager - [phone]  After 6pm go to [email or web address] - password [email or web address] [facility]  [facility]  Office  [phone]  CC: Primary care physician; Marygrace Drought, MD

## 2018-06-02 NOTE — ED Notes (Addendum)
Called pharmacy for lidocaine with epi, none in ER at this time.   EDP informed.

## 2018-06-02 NOTE — ED Provider Notes (Signed)
[facility] Emergency Department Provider Note       Time seen: ----------------------------------------- 11:30 AM on 06/02/2018 -----------------------------------------   I have reviewed the triage vital signs and the nursing notes.  HISTORY   Chief Complaint No chief complaint on file.    HPI Sara Pierce is a 77 y.o. female with a history of cardiomyopathy, CHF, COPD, depression, diabetes, hypertension who presents to the ED for pressure sores.  Patient arrives via EMS from home with pressure sores on the sacrum that appear to be infected.  She has a home health nurse that comes and checks on her and manages them.  She typically wears 3 L of nasal cannula oxygen chronically at home.  She was noted to have purulent drainage from her left buttock area.  She denies fevers, chills or other complaints.  Past Medical History:  Diagnosis Date  . Arthritis   . Cardiomyopathy ([place])   . Cataract   . Cerebral hemorrhage ([place])   . CHF (congestive heart failure) ([place])   . Chronic kidney disease   . Clotting disorder ([place])   . COPD (chronic obstructive pulmonary disease) ([place])   . Depression   . Diabetes mellitus without complication ([place])   . Hypertension   . Mixed incontinence   . Obesity   . Sleep apnea   . Thyroid disease   . Urinary frequency   . Vaginal atrophy   . Varicose veins     Patient Active Problem List   Diagnosis Date Noted  . Chronic diastolic heart failure ([place]) 05/12/2018  . NSTEMI (non-ST elevated myocardial infarction) ([place]) 04/15/2018  . COPD exacerbation ([place]) 03/23/2018  . COPD (chronic obstructive pulmonary disease) ([place]) 01/04/2018  . Pressure injury of skin 10/25/2017  . Absolute anemia 03/15/2015  . Anxiety 03/15/2015  . Arthritis 03/15/2015  . Cataract 03/15/2015  . Brain bleed ([place]) 03/15/2015  . Chronic constipation 03/15/2015  . Blood clotting disorder ([place]) 03/15/2015  . CAFL (chronic airflow limitation) ([place])  03/15/2015  . Clinical depression 03/15/2015  . Diabetes ([place]) 03/15/2015  . H/O varicella 03/15/2015  . BP (high blood pressure) 03/15/2015  . Mixed incontinence 03/15/2015  . 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 Adiposity 03/15/2015  . Apnea, sleep 03/15/2015  . Disease of thyroid gland 03/15/2015  . FOM (frequency of micturition) 03/15/2015  . 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 Candida vaginitis 03/15/2015  . Abnormal ECG 03/15/2015    Past Surgical History:  Procedure Laterality Date  . APPENDECTOMY    . Cardiac Catherization    . COLONOSCOPY WITH PROPOFOL N/A 04/28/2016   Procedure: COLONOSCOPY WITH PROPOFOL;  Surgeon: Manya Silvas, MD;  Location: [facility] ENDOSCOPY;  Service: Endoscopy;  Laterality: N/A;  . COLONOSCOPY WITH PROPOFOL N/A 04/29/2016   Procedure: COLONOSCOPY WITH PROPOFOL;  Surgeon: Manya Silvas, MD;  Location: [facility] ENDOSCOPY;  Service: Endoscopy;  Laterality: N/A;  . JOINT REPLACEMENT    . STRABISMUS SURGERY    . TONSILLECTOMY    . TUBAL LIGATION      Allergies Aspirin; Codeine; Naprosyn [naproxen]; Other; Tape; and Valacyclovir  Social History Social History   Tobacco Use  . Smoking status: Former Smoker    Packs/day: 0.50    Years: 44.00    Pack years: 22.00    Types: Cigarettes  . Smokeless tobacco: Never Used  Substance Use Topics  . Alcohol use: No  . Drug use: No   Review of Systems Constitutional: Negative for  fever. Cardiovascular: Negative for chest pain. Respiratory: Negative for shortness of breath. Gastrointestinal: Negative for abdominal pain, vomiting and diarrhea. Musculoskeletal: Negative for back pain. Skin: Positive for pressure sores and wound drainage from the left buttocks Neurological: Negative for headaches, focal weakness or numbness.  All systems negative/normal/unremarkable except as stated in the HPI  ____________________________________________   PHYSICAL  EXAM:  VITAL SIGNS: ED Triage Vitals  Enc Vitals Group     BP      Pulse      Resp      Temp      Temp src      SpO2      Weight      Height      Head Circumference      Peak Flow      Pain Score      Pain Loc      Pain Edu?      Excl. in [place]?    Constitutional: Alert and oriented.  Tonically ill-appearing, mild distress Eyes: Conjunctivae are normal. Normal extraocular movements. ENT   Head: Normocephalic and atraumatic.   Nose: No congestion/rhinnorhea.   Mouth/Throat: Mucous membranes are moist.   Neck: No stridor. Cardiovascular: Normal rate, regular rhythm. No murmurs, rubs, or gallops. Respiratory: Normal respiratory effort without tachypnea nor retractions. Breath sounds are clear and equal bilaterally. No wheezes/rales/rhonchi. Gastrointestinal: Soft and nontender. Normal bowel sounds Musculoskeletal: Range of motion of the extremities, the left buttocks area has induration and erythema with draining purulence suggesting underlying abscess Neurologic:  Normal speech and language. No gross focal neurologic deficits are appreciated.  Skin: Left superior buttock abscess as noted above Psychiatric: Mood and affect are normal. Speech and behavior are normal.  ____________________________________________  ED COURSE:  As part of my medical decision making, I reviewed the following data within the [place] History obtained from family if available, nursing notes, old chart and ekg, as well as notes from prior ED visits. Patient presented for abscess to the left buttocks region, we will assess with labs as indicated at this time.  She will need incision and drainage as well as IV antibiotics   .Marland KitchenIncision and Drainage Date/Time: 06/02/2018 1:32 PM Performed by: Earleen Newport, MD Authorized by: Earleen Newport, MD   Consent:    Consent obtained:  Verbal   Consent given by:  Patient   Risks discussed:  Bleeding, infection,  incomplete drainage and pain   Alternatives discussed:  Alternative treatment, delayed treatment and observation Location:    Type:  Abscess   Location:  Anogenital   Anogenital location:  Perirectal Pre-procedure details:    Skin preparation:  Betadine Anesthesia (see MAR for exact dosages):    Anesthesia method:  Local infiltration   Local anesthetic:  Lidocaine 1% WITH epi Procedure type:    Complexity:  Complex Procedure details:    Incision types:  Single straight   Incision depth:  Subcutaneous   Scalpel blade:  15   Wound management:  Probed and deloculated   Drainage:  Purulent and bloody   Drainage amount:  Copious   Wound treatment:  Drain placed   Packing materials:  1/4 in gauze Post-procedure details:    Patient tolerance of procedure:  Tolerated well, no immediate complications   ____________________________________________   LABS (pertinent positives/negatives)  Labs Reviewed  CBC WITH DIFFERENTIAL/PLATELET - Abnormal; Notable for the following components:      Result Value   WBC 12.4 (*)    RBC 3.36 (*)  Hemoglobin 10.7 (*)    HCT 31.1 (*)    RDW 15.7 (*)    Neutro Abs 10.8 (*)    Lymphs Abs 0.9 (*)    All other components within normal limits  COMPREHENSIVE METABOLIC PANEL - Abnormal; Notable for the following components:   Glucose, Bld 116 (*)    Total Protein 6.4 (*)    Albumin 3.1 (*)    AST 12 (*)    ALT 9 (*)    All other components within normal limits  AEROBIC/ANAEROBIC CULTURE (SURGICAL/DEEP WOUND)  ____________________________________________  DIFFERENTIAL DIAGNOSIS   Abscess, cellulitis, decubitus ulcer  FINAL ASSESSMENT AND PLAN  Abscess, cellulitis   Plan: The patient had presented for an abscess and cellulitis to the left buttock region. Patient's labs did reveal leukocytosis likely reflecting this infection.  As dictated above and had copious bloody and purulent material expressed from the wound.  We did send a wound  culture.  She received IV vancomycin as well.  She will need wound care and would benefit from hospital observation for antibiotics and wound recheck.   Laurence Aly, MD   Note: This note was generated in part or whole with voice recognition software. Voice recognition is usually quite accurate but there are transcription errors that can and very often do occur. I apologize for any typographical errors that were not detected and corrected.     Earleen Newport, MD 06/02/18 [phone]

## 2018-06-02 NOTE — Progress Notes (Signed)
Advanced care plan. Purpose of the Encounter: CODE STATUS Parties in Attendance:Patient Patient's Decision Capacity: Good Subjective/Patient's story: Resented to the emergency room with infection in the buttock area Objective/Medical story Patient has a left buttock abscess which needs incision and drainage and IV antibiotics Goals of care determination:  Advance care directives and goals of care discussed with the patient Patient wants everything done for now which includes cardiac resuscitation and intubation Goals not met CODE STATUS: Full code Time spent discussing advanced care planning: 16 minutes

## 2018-06-02 NOTE — ED Triage Notes (Signed)
To ER via [facility] from home with c/o pressure sores to sacrum that appear infected. VSS. Wears 3L [place] chronically at home, baseline nonproductive cough.  Has had bed sores X 2 weeks when she left previous nursing facility.

## 2018-06-02 NOTE — Consult Note (Signed)
Pharmacy Antibiotic Note  Sara Pierce is a 77 y.o. female admitted on 06/02/2018 with perirectal abscess.  Pharmacy has been consulted for vancomycin dosing.  Plan: Vancomycin 1g given in the ED. Will give next dose in 6 hours for stacked dosing Vancomycin 1000mg  IV every 12 hours.  Goal trough 15-20 mcg/mL. Trough prior to the 5th dose  Height: 5\' 5"  (165.1 cm) Weight: 210 lb (95.3 kg) IBW/kg (Calculated) : 57  Temp (24hrs), Avg:98.2 F (36.8 C), Min:98 F (36.7 C), Max:98.3 F (36.8 C)  Recent Labs  Lab 06/02/18 1146  WBC 12.4*  CREATININE 0.67    Estimated Creatinine Clearance: 69.4 mL/min (by C-G formula based on SCr of 0.67 mg/dL).    Allergies  Allergen Reactions  . Aspirin Swelling  . Codeine Itching  . Naprosyn [Naproxen] Other (See Comments)    Per MAR pt allergic  . Other     Elastic Bandages/ Supports Per MAR  . Tape Hives  . Valacyclovir     Antimicrobials this admission: vancomycin 6/21 >>   Dose adjustments this admission:   Microbiology results: 6/21 wound cx:  Thank you for allowing pharmacy to be a part of this patient's care.  Ramond Dial, Pharm.D, BCPS Clinical Pharmacist  06/02/2018 4:11 PM

## 2018-06-02 NOTE — ED Notes (Signed)
Pt abscess drained by EDP. This RN assisted. pericare provided and linens changed. Pt tolerated well.

## 2018-06-03 LAB — CBC
HCT: 29.1 % — ABNORMAL LOW (ref 35.0–47.0)
Hemoglobin: 9.9 g/dL — ABNORMAL LOW (ref 12.0–16.0)
MCH: 31.7 pg (ref 26.0–34.0)
MCHC: 33.9 g/dL (ref 32.0–36.0)
MCV: 93.4 fL (ref 80.0–100.0)
Platelets: 231 K/uL (ref 150–440)
RBC: 3.11 MIL/uL — ABNORMAL LOW (ref 3.80–5.20)
RDW: 15.8 % — ABNORMAL HIGH (ref 11.5–14.5)
WBC: 8.3 K/uL (ref 3.6–11.0)

## 2018-06-03 LAB — BASIC METABOLIC PANEL WITH GFR
Anion gap: 9 (ref 5–15)
BUN: 12 mg/dL (ref 6–20)
CO2: 26 mmol/L (ref 22–32)
Calcium: 8.7 mg/dL — ABNORMAL LOW (ref 8.9–10.3)
Chloride: 102 mmol/L (ref 101–111)
Creatinine, Ser: 0.63 mg/dL (ref 0.44–1.00)
GFR calc Af Amer: >60 mL/min
GFR calc non Af Amer: >60 mL/min
Glucose, Bld: 113 mg/dL — ABNORMAL HIGH (ref 65–99)
Potassium: 3.6 mmol/L (ref 3.5–5.1)
Sodium: 137 mmol/L (ref 135–145)

## 2018-06-03 MED ORDER — PIPERACILLIN-TAZOBACTAM 3.375 G IVPB
3.3750 g | Freq: Three times a day (TID) | INTRAVENOUS | Status: DC
  Administered 2018-06-03 – 2018-06-04 (×4): 3.375 g via INTRAVENOUS
  Filled 2018-06-03 (×5): qty 50

## 2018-06-03 MED ORDER — IPRATROPIUM-ALBUTEROL 0.5-2.5 (3) MG/3ML IN SOLN
3.0000 mL | Freq: Four times a day (QID) | RESPIRATORY_TRACT | Status: DC
  Administered 2018-06-03 – 2018-06-05 (×8): 3 mL via RESPIRATORY_TRACT
  Filled 2018-06-03 (×7): qty 3

## 2018-06-03 NOTE — Progress Notes (Signed)
Dressing changed on left buttock packing still in place

## 2018-06-03 NOTE — Progress Notes (Signed)
Dressing to L buttock changed, the packing is serosanguiness, I didn't disturb it. I changed the ABD pad that was on top of the wound/packing, old dressing had clear serous fluid only and no odor. Pt tol well. Repositioned in bed, Both her legs are contracted and it's hard to turn her ore reposition her.

## 2018-06-03 NOTE — Progress Notes (Signed)
[facility] [place] NAME: Sara Pierce    MR#:  [id number]  DATE OF BIRTH:  10/25/1941  SUBJECTIVE:  CHIEF COMPLAINT:   Chief Complaint  Patient presents with  . Wound Infection  Patient would like to have her breathing treatments scheduled 4 times daily light at home, noted cough, no events overnight per nursing staff  REVIEW OF SYSTEMS:  CONSTITUTIONAL: No fever, fatigue or weakness.  EYES: No blurred or double vision.  EARS, NOSE, AND THROAT: No tinnitus or ear pain.  RESPIRATORY: No cough, shortness of breath, wheezing or hemoptysis.  CARDIOVASCULAR: No chest pain, orthopnea, edema.  GASTROINTESTINAL: No nausea, vomiting, diarrhea or abdominal pain.  GENITOURINARY: No dysuria, hematuria.  ENDOCRINE: No polyuria, nocturia,  HEMATOLOGY: No anemia, easy bruising or bleeding SKIN: No rash or lesion. MUSCULOSKELETAL: No joint pain or arthritis.   NEUROLOGIC: No tingling, numbness, weakness.  PSYCHIATRY: No anxiety or depression.   ROS  DRUG ALLERGIES:   Allergies  Allergen Reactions  . Aspirin Swelling  . Codeine Itching  . Naprosyn [Naproxen] Other (See Comments)    Per MAR pt allergic  . Other     Elastic Bandages/ Supports Per MAR  . Tape Hives  . Valacyclovir     VITALS:  Blood pressure (!) 106/33, pulse 70, temperature (!) 97.4 F (36.3 C), temperature source Oral, resp. rate 20, height 5\' 5"  (1.651 m), weight 96.6 kg (213 lb), SpO2 99 %.  PHYSICAL EXAMINATION:  GENERAL:  77 y.o.-year-old patient lying in the bed with no acute distress.  EYES: Pupils equal, round, reactive to light and accommodation. No scleral icterus. Extraocular muscles intact.  HEENT: Head atraumatic, normocephalic. Oropharynx and nasopharynx clear.  NECK:  Supple, no jugular venous distention. No thyroid enlargement, no tenderness.  LUNGS: Normal breath sounds bilaterally, no wheezing, rales,rhonchi or crepitation. No use of accessory muscles of  respiration.  CARDIOVASCULAR: S1, S2 normal. No murmurs, rubs, or gallops.  ABDOMEN: Soft, nontender, nondistended. Bowel sounds present. No organomegaly or mass.  EXTREMITIES: No pedal edema, cyanosis, or clubbing.  NEUROLOGIC: Cranial nerves II through XII are intact. Muscle strength 5/5 in all extremities. Sensation intact. Gait not checked.  PSYCHIATRIC: The patient is alert and oriented x 3.  SKIN: No obvious rash, lesion, or ulcer.   Physical Exam LABORATORY PANEL:   CBC Recent Labs  Lab 06/03/18 0552  WBC 8.3  HGB 9.9*  HCT 29.1*  PLT 231   ------------------------------------------------------------------------------------------------------------------  Chemistries  Recent Labs  Lab 06/02/18 1146 06/03/18 0552  NA 136 137  K 3.8 3.6  CL 102 102  CO2 25 26  GLUCOSE 116* 113*  BUN 12 12  CREATININE 0.67 0.63  CALCIUM 9.0 8.7*  AST 12*  --   ALT 9*  --   ALKPHOS 72  --   BILITOT 0.8  --    ------------------------------------------------------------------------------------------------------------------  Cardiac Enzymes No results for input(s): TROPONINI in the last 168 hours. ------------------------------------------------------------------------------------------------------------------  RADIOLOGY:  No results found.  ASSESSMENT AND PLAN:  77 year old elderly female patient with history of congestive heart failure, cardiomyopathy, chronic kidney disease, COPD, diabetes mellitus type 2, hypertension, sleep apnea presented to the emergency room for left buttock swelling and infection.  *Acute large left perirectal abscess  Stable status post incision drainage with packing in the emergency room Continue empiric Zosyn/vancomycin, follow-up on outstanding cultures, dressing changes as needed, continue close medical monitoring  Wound care consulted  *Chronic hypoxic respiratory failure Stable On 3 3 L  continuous at home  *COPD without  exacerbation Scheduled duo nebs 4 times daily, mucolytic agents, inhaled corticosteroids twice daily, and continue close medical monitoring  *Chronic obesity Stable Most likely secondary to excess calories Lifestyle modification recommended   DVT prophylaxis with [place] lovenox daily Disposition 1-3 days pending clinical course    All the records are reviewed and case discussed with Care Management/Social Workerr. Management plans discussed with the patient, family and they are in agreement.  CODE STATUS: full  TOTAL TIME TAKING CARE OF THIS PATIENT: 35 minutes.     POSSIBLE D/C IN 1-3 DAYS, DEPENDING ON CLINICAL CONDITION.   Avel Peace Salary M.D on 06/03/2018   Between 7am to 6pm - Pager - [phone]  After 6pm go to [email or web address] - password [email or web address] [facility]  Office  [phone]  CC: Primary care physician; Marygrace Drought, MD  Note: This dictation was prepared with Dragon dictation along with smaller phrase technology. Any transcriptional errors that result from this process are unintentional.

## 2018-06-04 LAB — VANCOMYCIN, TROUGH: Vancomycin Tr: 20 ug/mL (ref 15–20)

## 2018-06-04 MED ORDER — DOXYCYCLINE HYCLATE 100 MG PO TABS
100.0000 mg | ORAL_TABLET | Freq: Two times a day (BID) | ORAL | Status: DC
  Administered 2018-06-04 – 2018-06-05 (×3): 100 mg via ORAL
  Filled 2018-06-04 (×3): qty 1

## 2018-06-04 MED ORDER — POLYETHYLENE GLYCOL 3350 17 G PO PACK
17.0000 g | PACK | Freq: Every day | ORAL | Status: DC
  Administered 2018-06-04 – 2018-06-05 (×2): 17 g via ORAL
  Filled 2018-06-04 (×2): qty 1

## 2018-06-04 MED ORDER — PREMIER PROTEIN SHAKE
11.0000 [oz_av] | Freq: Two times a day (BID) | ORAL | Status: DC
  Administered 2018-06-05: 11 [oz_av] via ORAL

## 2018-06-04 MED ORDER — OCUVITE-LUTEIN PO CAPS
1.0000 | ORAL_CAPSULE | Freq: Every day | ORAL | Status: DC
  Administered 2018-06-05: 1 via ORAL
  Filled 2018-06-04: qty 1

## 2018-06-04 MED ORDER — POLYETHYLENE GLYCOL 3350 17 GM/SCOOP PO POWD
1.0000 | Freq: Once | ORAL | Status: DC
  Filled 2018-06-04: qty 255

## 2018-06-04 MED ORDER — SENNOSIDES-DOCUSATE SODIUM 8.6-50 MG PO TABS
2.0000 | ORAL_TABLET | Freq: Every day | ORAL | Status: DC
  Administered 2018-06-04: 2 via ORAL
  Filled 2018-06-04: qty 2

## 2018-06-04 NOTE — Evaluation (Signed)
Physical Therapy Evaluation Patient Details Name: Sara Pierce MRN: [id number] DOB: [birth year] Today's Date: 06/04/2018   History of Present Illness  Pt.  is a 77 y.o. female with a known history of cardiomyopathy, congestive heart failure, chronic kidney disease, COPD, diabetes mellitus type 2, hypertension, arthritis, sleep apnea presented to the emergency room with discharge from the buttock area. Pt was found to have abscess in the left buttock perirectal area.  Incision and drainage was done in the emergency room and patient was started on IV vancomycin antibiotic.  Patient is usually bedbound she moves with the help of wheelchair.  Patient gets onto the wheelchair with the help of a Hoyer lift at home.  Has been in wc or in chair/bed since husband had her leave rehab early in late 2018.  Had spinal (T6)fracture and has not walked since then per pt. report.  Previous notes state patietn has TLSO brace however none was noted to be in room. Pt reports her wc does not get through doorways.  Pt is in [facility] program and reports has an aide come in the morning and at night.. No complaints of any chest pain, shortness of breath.  On oxygen via nasal cannula at 3 L at home.  Clinical Impression  Patient is a pleasant 77 year old female who presents to hospital for perirectal abscess secondary to bedbound mobility status. Patient utilizes hoyer lift at baseline since T6 fx and has not ambulated since that incident. Was unable to perform transition to EOB sitting at evaluation. Require Min A for rolling to R and Mod A for rolling to L. Noted breakdown of bilateral heels with patient educated on how to perform bed strengthening interventions without worsening breakdown. Patient has severe plantarflexion contractures bilaterally and unable to obtain neutral alignment actively or passively indicating need for nightsplints/splinting to reduce contractures to allow for increased patient engagement with mobility and ADL  performance. Foot position limits patient's ability to perform pressure relief in bed and is a possible factor for current breakdowns at this time. Pt demonstrates deficits with functional independence including strength, ROM, endurance, and mobility. Would benefit from skilled PT to address above deficits and promote optimal return to PLOF. Will keep on caseload during acute admission to see if progress is possible and acceptable for HHPT. Will continue to progress.    Follow Up Recommendations Home health PT;Supervision/Assistance - 24 hour    Equipment Recommendations  (nightsplints to reduce pf contracture of bilateral ankles)    Recommendations for Other Services       Precautions / Restrictions Precautions Precautions: Other (comment) Precaution Comments: Patient has pressure ulcer of buttocks and intial breakdown of bilateral heels.  Required Braces or Orthoses: (per previous notes has TLSO ) Restrictions Weight Bearing Restrictions: No Other Position/Activity Restrictions: Patient has noted contractions and breakdown of bilateral LE's.       Mobility  Bed Mobility Overal bed mobility: Needs Assistance Bed Mobility: Rolling Rolling: Mod assist;Min assist         General bed mobility comments: Min A to roll to R side due to ability to use R arm for assistance, require mod A to roll to L side due to inability to use LUE for assistance.   Transfers                 General transfer comment: unable to transfer at this time, uses hoyer lift at baseline to transfer to wc.   Ambulation/Gait  General Gait Details: has not ambulated in >1 year  Stairs            Wheelchair Mobility    Modified Rankin (Stroke Patients Only)       Balance Overall balance assessment: (unable to assess due to unable to obtain EOB seated position)                                           Pertinent Vitals/Pain Pain Assessment: No/denies pain     Home Living Family/patient expects to be discharged to:: Private residence Living Arrangements: Spouse/significant other Available Help at Discharge: Available PRN/intermittently;Family Type of Home: House Home Access: Ramped entrance     Home Layout: One level Home Equipment: Environmental consultant - 2 wheels Additional Comments: Lives with her husband at home.  Has hospital bed, hoyer lift and manual w/c at home. Requires hoyer lift to transfer to wc. Has not ambulated in greater than a year    Prior Function Level of Independence: Needs assistance   Gait / Transfers Assistance Needed: Patient dependent upon hoyer lift for transfer to w/c at baseline since sustaining thoracic spinal fx (T6) and L UE fx August 31st, 2018. She reports they had her standing at rehab until she left. Reports she has not walked since fracture and has difficulty using L hand for rolling resulting in need for assis for bed mobility. Patient is pushed in w/c at home per pt. report but reports doorways are too small for her to go through  ADL's / Homemaking Assistance Needed: relies on husband and home aid  Comments: Patient reports fear of standing and sitting EOB since fall. Has noted breakdown of skin on heels as well as bandaged region of perianal due to pressure ulcer     Hand Dominance   Dominant Hand: Right    Extremity/Trunk Assessment   Upper Extremity Assessment Upper Extremity Assessment: LUE deficits/detail;RUE deficits/detail(refused to move LUE ) RUE Deficits / Details: able to move overhead, use for assist for bed mobility  LUE Deficits / Details: refused to move LUE     Lower Extremity Assessment Lower Extremity Assessment: LLE deficits/detail;RLE deficits/detail RLE Deficits / Details: unable to assess fully due to contractures of hip and ankle. Ankle flexed in pf with inability to obtain neutral position. Able to move hips in supine position however limited in duration due to skin breakdown.  RLE  Sensation: (breakdown of skin on R heel) LLE Deficits / Details: unable to assess fully due to contractures of hip and ankle. Ankle flexed into PF with inability to actively or passively obtain neutral position.  LLE Sensation: (breakdown of L heel )       Communication   Communication: No difficulties  Cognition Arousal/Alertness: Awake/alert Behavior During Therapy: WFL for tasks assessed/performed Overall Cognitive Status: Difficult to assess                                 General Comments: Patient in pleasant mood and mindset, able to orient to time and place. However, required cueing for task orientaiton due to fixation on various aspects of conversation.       General Comments General comments (skin integrity, edema, etc.): breakdown of bilateral heels initially noted. Stage II pressure ulcer of perirectal region. PF contractures bilaterally     Exercises Total Joint Exercises  Ankle Circles/Pumps: AROM;Both;10 reps;Supine(towel under ankle to raise heel from bed to reduce friction) Heel Slides: AROM;Both;10 reps(carefully to avoid friction to open wounds. ) Hip ABduction/ADduction: AROM;Both;5 reps(carefully with use of propping to reduce friction to wounds) Goniometric ROM: PF contracture ~-15 PROM bilaterally  Other Exercises Other Exercises: roll R and L for pressure relief Other Exercises: education on performing heel pumps with towel to reduce friction, heel slides, etc. for bed strengthening interventions    Assessment/Plan    PT Assessment    PT Problem List         PT Treatment Interventions      PT Goals (Current goals can be found in the Care Plan section)  Acute Rehab PT Goals Patient Stated Goal: return home, increase independance with ADLs PT Goal Formulation: With patient Time For Goal Achievement: 06/18/18 Potential to Achieve Goals: Fair    Frequency     Barriers to discharge        Co-evaluation               AM-PAC PT  "6 Clicks" Daily Activity  Outcome Measure Difficulty turning over in bed (including adjusting bedclothes, sheets and blankets)?: Unable Difficulty moving from lying on back to sitting on the side of the bed? : Unable Difficulty sitting down on and standing up from a chair with arms (e.g., wheelchair, bedside commode, etc,.)?: Unable Help needed moving to and from a bed to chair (including a wheelchair)?: Total Help needed walking in hospital room?: Total Help needed climbing 3-5 steps with a railing? : Total 6 Click Score: 6    End of Session Equipment Utilized During Treatment: Oxygen(3 L 02 ) Activity Tolerance: Treatment limited secondary to medical complications (Comment) Patient left: in bed;with nursing/sitter in room;with call bell/phone within reach;with bed alarm set Nurse Communication: Mobility status PT Visit Diagnosis: Muscle weakness (generalized) (M62.81);Other abnormalities of gait and mobility (R26.89)    Time: 9937-1696 PT Time Calculation (min) (ACUTE ONLY): 25 min   Charges:   PT Evaluation $PT Eval Low Complexity: 1 Low PT Treatments $Therapeutic Exercise: 8-22 mins   PT G Codes:        Janna Arch, PT, DPT   Janna Arch 06/04/2018, 5:24 PM

## 2018-06-04 NOTE — Progress Notes (Signed)
L buttock dressing changed, packing left in place, small amt serosand. Fluid to old dressing, no odor. Pt tol well.

## 2018-06-04 NOTE — Progress Notes (Signed)
ANTIBIOTIC CONSULT NOTE - INITIAL  Pharmacy Consult for Vancomycin Indication: perirectal abscess  Allergies  Allergen Reactions  . Aspirin Swelling  . Codeine Itching  . Naprosyn [Naproxen] Other (See Comments)    Per MAR pt allergic  . Other     Elastic Bandages/ Supports Per MAR  . Tape Hives  . Valacyclovir     Patient Measurements: Height: 5\' 5"  (165.1 cm) Weight: 213 lb (96.6 kg) IBW/kg (Calculated) : 57 Adjusted Body Weight:   Vital Signs: Temp: 97.6 F (36.4 C) (06/23 0546) Temp Source: Oral (06/23 0546) BP: 126/62 (06/23 0546) Pulse Rate: 66 (06/23 0546) Intake/Output from previous day: 06/22 0701 - 06/23 0700 In: 100 [IV Piggyback:100] Out: 1500 [Urine:1500] Intake/Output from this shift: Total I/O In: 100 [IV Piggyback:100] Out: 1000 [Urine:1000]  Labs: Recent Labs    06/02/18 1146 06/03/18 0552  WBC 12.4* 8.3  HGB 10.7* 9.9*  PLT 241 231  CREATININE 0.67 0.63   Estimated Creatinine Clearance: 69.8 mL/min (by C-G formula based on SCr of 0.63 mg/dL). Recent Labs    06/04/18 0523  [facility] 20     Microbiology: Recent Results (from the past 720 hour(s))  Aerobic/Anaerobic Culture (surgical/deep wound)     Status: None (Preliminary result)   Collection Time: 06/02/18 11:46 AM  Result Value Ref Range Status   Specimen Description   Final    ABSCESS Performed at [facility], [address], [place], [place] [address]    Special Requests   Final    Normal Performed at [facility], [address] [place], [place] [address]    Gram Stain   Final    RARE WBC PRESENT, PREDOMINANTLY PMN FEW GRAM POSITIVE COCCI    Culture   Final    MODERATE STAPHYLOCOCCUS AUREUS SUSCEPTIBILITIES TO FOLLOW NO ANAEROBES ISOLATED; CULTURE IN PROGRESS FOR 5 DAYS    Report Status PENDING  Incomplete  MRSA PCR Screening     Status: Abnormal   Collection Time: 06/02/18  5:23 PM  Result Value Ref Range Status   MRSA by PCR POSITIVE (A)  NEGATIVE Final    Comment:        The GeneXpert MRSA Assay (FDA approved for NASAL specimens only), is one component of a comprehensive MRSA colonization surveillance program. It is not intended to diagnose MRSA infection nor to guide or monitor treatment for MRSA infections. RESULT CALLED TO, READ BACK BY AND VERIFIED WITH: TRISHA KING @1951  06/02/18 AKT Performed at [facility], [address] [place], [place] [address]     Medical History: Past Medical History:  Diagnosis Date  . Arthritis   . Cardiomyopathy ([place])   . Cataract   . Cerebral hemorrhage ([place])   . CHF (congestive heart failure) ([employer])   . Chronic kidney disease   . Clotting disorder ([place])   . COPD (chronic obstructive pulmonary disease) ([place])   . Depression   . Diabetes mellitus without complication ([place])   . Hypertension   . Mixed incontinence   . Obesity   . Sleep apnea   . Thyroid disease   . Urinary frequency   . Vaginal atrophy   . Varicose veins     Medications:  Scheduled:  . ARIPiprazole  5 mg Oral QHS  . budesonide  0.5 mg Nebulization BID  . buPROPion  150 mg Oral Daily  . clonazePAM  1 mg Oral BID  . clopidogrel  75 mg Oral Daily  . dextromethorphan  30 mg Oral BID  .  docusate sodium  100 mg Oral BID  . enoxaparin (LOVENOX) injection  40 mg Subcutaneous Q24H  . FLUoxetine  80 mg Oral Daily  . furosemide  20 mg Oral BID  . gabapentin  200 mg Oral TID  . guaiFENesin  600 mg Oral BID  . ipratropium-albuterol  3 mL Nebulization QID  . linagliptin  5 mg Oral Daily  . liothyronine  25 mcg Oral Daily  . mirtazapine  7.5 mg Oral QHS  . montelukast  10 mg Oral QHS  . pantoprazole  40 mg Oral Daily  . potassium chloride  10 mEq Oral BID  . simvastatin  40 mg Oral QHS  . sodium chloride flush  3 mL Intravenous Q12H   Assessment:   Goal of Therapy:  Vancomycin trough level 15-20 mcg/ml  Plan:  WINDY DUDEK is a 77 y.o. female admitted on 06/02/2018 with perirectal  abscess.  Pharmacy has been consulted for vancomycin dosing.   Vancomycin 1g given in the ED. Will give next dose in 6 hours for stacked dosing Vancomycin 1000mg  IV every 12 hours.  Goal trough 15-20 mcg/mL. Trough prior to the 5th dose  6/23:  VT @ 0530 = 20 mcg/mL Will continue this pt on current vancomycin dose.     Solly Derasmo D 06/04/2018,6:18 AM

## 2018-06-04 NOTE — Progress Notes (Signed)
Initial Nutrition Assessment  DOCUMENTATION CODES:   Obesity unspecified  INTERVENTION:  Provide Premier Protein po BID, each supplement provides 160 kcal and 30 grams of protein.  Provide Ocuvite daily to promote wound healing.  Encouraged patient to choose an adequate source of protein at meals. Discussed importance of protein intake in preventing loss of lean body mass (patient has been experiencing unintentional weight loss) and also in promoting healing of her new abscess/wound.  NUTRITION DIAGNOSIS:   Increased nutrient needs related to wound healing as evidenced by estimated needs.  GOAL:   Patient will meet greater than or equal to 90% of their needs  MONITOR:   PO intake, Supplement acceptance, Labs, Weight trends, Skin, I & O's  REASON FOR ASSESSMENT:   Malnutrition Screening Tool    ASSESSMENT:   77 year old female with PMHx of COPD, CKD, depression, cardiomyopathy, DM type 2, HTN, sleep apnea, thyroid disease, CHF who was admitted with acute large left perirectal abscess s/p incision and drainage with packing in ED.   Met with patient at bedside. She is known to this RD from several previous admissions. She reports her appetite has improved and she has been eating better at meals. In January patient was only able to eat small bites of applesauce or gelatin at meals. She reports she is now able to eat a good source of protein at most meals and also is eating fruits and vegetables. She reports the wound is new and has developed over the past 2 weeks. Patient is reporting some constipation. She has previously reported difficulty chewing, but today reports that she is not having any difficulty chewing or swallowing her regular texture meals (on heart healthy/carbohydrate modified diet).  UBW had been 250 lbs. Patient was 241.7 lbs on 11/01/2017 and has had unintentional weight loss. Weight loss is difficult to trend since most of the weights in the chart are not true  measured weights. Patient reports she is down to 210 lbs now.  Medications reviewed and include: clonazepam, doxycycline, Remeron 7.5 mg daily at bedtime, pantoprazole, potassium chloride 10 mEq BID, senna-docusate, vancomycin.  Labs reviewed.  Patient does not meet criteria for malnutrition but is at risk for malnutrition.  NUTRITION - FOCUSED PHYSICAL EXAM:    Most Recent Value  Orbital Region  No depletion  Upper Arm Region  Mild depletion  Thoracic and Lumbar Region  No depletion  Buccal Region  No depletion  Temple Region  Mild depletion  Clavicle Bone Region  No depletion  Clavicle and Acromion Bone Region  Mild depletion  Scapular Bone Region  No depletion  Dorsal Hand  No depletion  Patellar Region  No depletion  Anterior Thigh Region  No depletion  Posterior Calf Region  No depletion  Edema (RD Assessment)  None  Hair  Reviewed  Eyes  Reviewed  Mouth  Reviewed [poor dentition]  Skin  Reviewed  Nails  Reviewed     Diet Order:   Diet Order           Diet heart healthy/carb modified Room service appropriate? Yes; Fluid consistency: Thin  Diet effective now          EDUCATION NEEDS:   Education needs have been addressed  Skin:  Skin Assessment: Skin Integrity Issues: Skin Integrity Issues:: Other (Comment) Other: MSAD to groin; large left perirectal abscess  Last BM:  06/02/2018  Height:   Ht Readings from Last 1 Encounters:  06/02/18 _0  (1.651 m)    Weight:  Wt Readings from Last 1 Encounters:  06/03/18 213 lb (96.6 kg)    Ideal Body Weight:  56.8 kg  BMI:  Body mass index is 35.45 kg/m.  Estimated Nutritional Needs:   Kcal:  1760-2050 (MSJ x 1.2-1.4)  Protein:  95-115 grams (1-1.2 grams/kg)  Fluid:  1.7-2 L/day (30-35 mL/kg IBW)  Willey Blade, MS, RD, LDN Office: [phone] Pager: [phone] After Hours/Weekend Pager: [phone]

## 2018-06-04 NOTE — Progress Notes (Signed)
[place] [facility] [place] NAME: Mana Haberl    MR#:  [id number]  DATE OF BIRTH:  06/06/1941  SUBJECTIVE:  CHIEF COMPLAINT:   Chief Complaint  Patient presents with  . Wound Infection  Complains of constipation only, cultures noted for MRSA, start doxycycline, discontinue Zosyn  REVIEW OF SYSTEMS:  CONSTITUTIONAL: No fever, fatigue or weakness.  EYES: No blurred or double vision.  EARS, NOSE, AND THROAT: No tinnitus or ear pain.  RESPIRATORY: No cough, shortness of breath, wheezing or hemoptysis.  CARDIOVASCULAR: No chest pain, orthopnea, edema.  GASTROINTESTINAL: No nausea, vomiting, diarrhea or abdominal pain.  GENITOURINARY: No dysuria, hematuria.  ENDOCRINE: No polyuria, nocturia,  HEMATOLOGY: No anemia, easy bruising or bleeding SKIN: No rash or lesion. MUSCULOSKELETAL: No joint pain or arthritis.   NEUROLOGIC: No tingling, numbness, weakness.  PSYCHIATRY: No anxiety or depression.   ROS  DRUG ALLERGIES:   Allergies  Allergen Reactions  . Aspirin Swelling  . Codeine Itching  . Naprosyn [Naproxen] Other (See Comments)    Per MAR pt allergic  . Other     Elastic Bandages/ Supports Per MAR  . Tape Hives  . Valacyclovir     VITALS:  Blood pressure 126/62, pulse 66, temperature 97.6 F (36.4 C), temperature source Oral, resp. rate 16, height 5\' 5"  (1.651 m), weight 96.6 kg (213 lb), SpO2 97 %.  PHYSICAL EXAMINATION:  GENERAL:  77 y.o.-year-old patient lying in the bed with no acute distress.  EYES: Pupils equal, round, reactive to light and accommodation. No scleral icterus. Extraocular muscles intact.  HEENT: Head atraumatic, normocephalic. Oropharynx and nasopharynx clear.  NECK:  Supple, no jugular venous distention. No thyroid enlargement, no tenderness.  LUNGS: Normal breath sounds bilaterally, no wheezing, rales,rhonchi or crepitation. No use of accessory muscles of respiration.  CARDIOVASCULAR: S1, S2 normal. No murmurs,  rubs, or gallops.  ABDOMEN: Soft, nontender, nondistended. Bowel sounds present. No organomegaly or mass.  EXTREMITIES: No pedal edema, cyanosis, or clubbing.  NEUROLOGIC: Cranial nerves II through XII are intact. Muscle strength 5/5 in all extremities. Sensation intact. Gait not checked.  PSYCHIATRIC: The patient is alert and oriented x 3.  SKIN: No obvious rash, lesion, or ulcer.   Physical Exam LABORATORY PANEL:   CBC Recent Labs  Lab 06/03/18 0552  WBC 8.3  HGB 9.9*  HCT 29.1*  PLT 231   ------------------------------------------------------------------------------------------------------------------  Chemistries  Recent Labs  Lab 06/02/18 1146 06/03/18 0552  NA 136 137  K 3.8 3.6  CL 102 102  CO2 25 26  GLUCOSE 116* 113*  BUN 12 12  CREATININE 0.67 0.63  CALCIUM 9.0 8.7*  AST 12*  --   ALT 9*  --   ALKPHOS 72  --   BILITOT 0.8  --    ------------------------------------------------------------------------------------------------------------------  Cardiac Enzymes No results for input(s): TROPONINI in the last 168 hours. ------------------------------------------------------------------------------------------------------------------  RADIOLOGY:  No results found.  ASSESSMENT AND PLAN:  77 year old elderly female patient with history of congestive heart failure, cardiomyopathy, chronic kidney disease, COPD, diabetes mellitus type 2, hypertension, sleep apnea presented to the emergency room for left buttock swelling and infection.  *Acute large MRSA left perirectal abscess  Resolving status post incision drainage with packing in the emergency room Continue empiric vancomycin, add doxycycline-sensitivities noted, dressing changes/local wound care, and plan for tentative discharge on tomorrow to home in the care of family  Wound care consulted  *Chronic hypoxic respiratory failure Stable On 3 L continuous at home  *  COPD without  exacerbation Stable Continue duo nebs 4 times daily, mucolytic agents, inhaled corticosteroids twice daily  *Chronic obesity Stable Most likely secondary to excess calories Lifestyle modification recommended  DVT prophylaxis with Vander lovenox daily Disposition 1-2 days pending clinical course  All the records are reviewed and case discussed with Care Management/Social Workerr. Management plans discussed with the patient, family and they are in agreement.  CODE STATUS: full  TOTAL TIME TAKING CARE OF THIS PATIENT: 35 minutes.     POSSIBLE D/C IN 1-2 DAYS, DEPENDING ON CLINICAL CONDITION.   Avel Peace Salary M.D on 06/04/2018   Between 7am to 6pm - Pager - [phone]  After 6pm go to [email or web address] - password [email or web address] [facility]  Office  [phone]  CC: Primary care physician; Marygrace Drought, MD  Note: This dictation was prepared with Dragon dictation along with smaller phrase technology. Any transcriptional errors that result from this process are unintentional.

## 2018-06-05 MED ORDER — MUPIROCIN 2 % EX OINT
1.0000 "application " | TOPICAL_OINTMENT | Freq: Two times a day (BID) | CUTANEOUS | Status: DC
  Administered 2018-06-05: 1 via NASAL
  Filled 2018-06-05: qty 22

## 2018-06-05 MED ORDER — DOXYCYCLINE HYCLATE 100 MG PO TABS: 100.0000 mg | ORAL_TABLET | Freq: Two times a day (BID) | ORAL | 0 refills | Status: DC

## 2018-06-05 MED ORDER — CHLORHEXIDINE GLUCONATE CLOTH 2 % EX PADS
6.0000 | MEDICATED_PAD | Freq: Every day | CUTANEOUS | Status: DC
  Administered 2018-06-05: 6 via TOPICAL

## 2018-06-05 NOTE — Progress Notes (Signed)
Sara Pierce  A and O x 4. VSS. Pt tolerating diet well. No complaints of pain or nausea. IV removed intact, prescriptions given. Pt voiced understanding of discharge instructions with no further questions. Pt discharged via EMS to her house.     Allergies as of 06/05/2018      Reactions   Aspirin Swelling   Codeine Itching   Naprosyn [naproxen] Other (See Comments)   Per MAR pt allergic   Other    Elastic Bandages/ Supports Per MAR   Tape Hives   Valacyclovir       Medication List    TAKE these medications   acetaminophen 325 MG tablet Commonly known as:  TYLENOL Take 2 tablets (650 mg total) by mouth every 6 (six) hours as needed for mild pain (or Fever >/= 101).   albuterol (2.5 MG/3ML) 0.083% nebulizer solution Commonly known as:  PROVENTIL Inhale 3 mLs into the lungs every 6 (six) hours as needed for wheezing or shortness of breath.   ANORO ELLIPTA 62.5-25 MCG/INH Aepb Generic drug:  umeclidinium-vilanterol Inhale 1 puff into the lungs daily.   ARIPiprazole 5 MG tablet Commonly known as:  ABILIFY Take 5 mg by mouth at bedtime.   budesonide 0.5 MG/2ML nebulizer solution Commonly known as:  PULMICORT Take 2 mLs (0.5 mg total) 2 (two) times daily by nebulization.   buPROPion 150 MG 24 hr tablet Commonly known as:  WELLBUTRIN XL Take 150 mg by mouth daily.   clonazePAM 1 MG tablet Commonly known as:  KLONOPIN Take 1 tablet (1 mg total) by mouth 2 (two) times daily.   clopidogrel 75 MG tablet Commonly known as:  PLAVIX Take 1 tablet (75 mg total) by mouth daily.   dextromethorphan 30 MG/5ML liquid Commonly known as:  DELSYM Take 5 mLs (30 mg total) by mouth 2 (two) times daily.   docusate sodium 100 MG capsule Commonly known as:  COLACE Take 1 capsule (100 mg total) by mouth 2 (two) times daily.   doxycycline 100 MG tablet Commonly known as:  VIBRA-TABS Take 1 tablet (100 mg total) by mouth every 12 (twelve) hours.   FLUoxetine 40 MG capsule Commonly  known as:  PROZAC Take 80 mg by mouth daily.   fluticasone 50 MCG/ACT nasal spray Commonly known as:  FLONASE Place 2 sprays into both nostrils daily.   furosemide 20 MG tablet Commonly known as:  LASIX Take 1 tablet (20 mg total) by mouth 2 (two) times daily.   gabapentin 100 MG capsule Commonly known as:  NEURONTIN Take 2 capsules (200 mg total) by mouth 3 (three) times daily.   guaiFENesin 600 MG 12 hr tablet Commonly known as:  MUCINEX Take 1 tablet (600 mg total) by mouth 2 (two) times daily.   ipratropium-albuterol 0.5-2.5 (3) MG/3ML Soln Commonly known as:  DUONEB Take 3 mLs by nebulization 4 (four) times daily.   liothyronine 25 MCG tablet Commonly known as:  CYTOMEL Take 25 mcg by mouth daily.   mirtazapine 15 MG tablet Commonly known as:  REMERON Take 7.5 mg by mouth at bedtime.   montelukast 10 MG tablet Commonly known as:  SINGULAIR Take 10 mg by mouth at bedtime.   omeprazole 20 MG capsule Commonly known as:  PRILOSEC Take 20 mg by mouth daily.   polyethylene glycol packet Commonly known as:  MIRALAX / GLYCOLAX Take 17 g by mouth daily.   potassium chloride 10 MEQ tablet Commonly known as:  K-DUR Take 10 mEq by mouth 2 (  two) times daily.   simvastatin 40 MG tablet Commonly known as:  ZOCOR Take 40 mg by mouth at bedtime.   tiotropium 18 MCG inhalation capsule Commonly known as:  SPIRIVA Place 18 mcg into inhaler and inhale daily.   tiZANidine 2 MG tablet Commonly known as:  ZANAFLEX Take 1 tablet (2 mg total) by mouth every 6 (six) hours as needed for muscle spasms.   traMADol 50 MG tablet Commonly known as:  ULTRAM Take 1 tablet (50 mg total) by mouth every 6 (six) hours as needed for moderate pain.   Vitamin D (Ergocalciferol) 50000 units Caps capsule Commonly known as:  DRISDOL Take 50,000 Units by mouth every 30 (thirty) days.       Vitals:   06/05/18 1253 06/05/18 1521  BP: 128/66 (!) 104/58  Pulse: 77 75  Resp: 19   Temp:  98 F (36.7 C)   SpO2: 99% 100%    Francesco Sor

## 2018-06-05 NOTE — Consult Note (Addendum)
[facility] Nurse wound consult note Reason for Consult: Consult requested for left buttock wound; pt had I&D of abscess in the ER for this location on 6/21, according to the EMR. Wound type: Full thickness wound Measurement: .3X.3X1cm Wound bed: narrow opening, unable to visualize wound Drainage (amount, consistency, odor) mod amt tan drainage when previous packing was removed, no odor Periwound: intact skin surrounding Dressing procedure/placement/frequency: Iodoform packing to absorb drainage and promote healing.  Foam outer dressing to protect from further injury.  Recommend home health assistance for dressing changes after discharge; please order if desired. Discussed plan of care with patient, no family members at the bedside. Please re-consult if further assistance is needed.  Thank-you,  Julien Girt MSN, [place], [place], [place], [place]

## 2018-06-05 NOTE — Discharge Summary (Signed)
[facility] [place] NAME: Sara Pierce    MR#:  [id number]  DATE OF BIRTH:  1941/07/24  DATE OF ADMISSION:  06/02/2018 ADMITTING PHYSICIAN: Saundra Shelling, MD  DATE OF DISCHARGE: No discharge date for patient encounter.  PRIMARY CARE PHYSICIAN: Marygrace Drought, MD    ADMISSION DIAGNOSIS:  Cellulitis of buttock [H20.947] Abscess [L02.91]  DISCHARGE DIAGNOSIS:  Active Problems:   Perirectal abscess   SECONDARY DIAGNOSIS:   Past Medical History:  Diagnosis Date  . Arthritis   . Cardiomyopathy ([place])   . Cataract   . Cerebral hemorrhage ([place])   . CHF (congestive heart failure) ([place])   . Chronic kidney disease   . Clotting disorder ([place])   . COPD (chronic obstructive pulmonary disease) ([place])   . Depression   . Diabetes mellitus without complication ([place])   . Hypertension   . Mixed incontinence   . Obesity   . Sleep apnea   . Thyroid disease   . Urinary frequency   . Vaginal atrophy   . Varicose veins     HOSPITAL COURSE:  77 year old elderly female patient with history of congestive heart failure, cardiomyopathy, chronic kidney disease, COPD, diabetes mellitus type 2, hypertension, sleep apnea presented to the emergency room for left buttock swelling and infection.  *Acute large MRSA leftperirectal abscess Resolving status post incision drainagewith packingin the emergency room Treated with empiric vancomycin, sensitivities noted-started on doxycycline, seen by wound care nurse, dressing changes/local wound care done while in house  *Chronic hypoxic respiratory failure Stable Continued On3 L continuous at home  *COPD without exacerbation Stable Continue duo nebs 4 times daily, mucolytic agents, inhaled corticosteroids twice daily  *Chronic obesity Stable Most likely secondary to excess calories Lifestyle modification recommended   DISCHARGE CONDITIONS:   stable  CONSULTS OBTAINED:    DRUG  ALLERGIES:   Allergies  Allergen Reactions  . Aspirin Swelling  . Codeine Itching  . Naprosyn [Naproxen] Other (See Comments)    Per MAR pt allergic  . Other     Elastic Bandages/ Supports Per MAR  . Tape Hives  . Valacyclovir     DISCHARGE MEDICATIONS:   Allergies as of 06/05/2018      Reactions   Aspirin Swelling   Codeine Itching   Naprosyn [naproxen] Other (See Comments)   Per MAR pt allergic   Other    Elastic Bandages/ Supports Per MAR   Tape Hives   Valacyclovir       Medication List    TAKE these medications   acetaminophen 325 MG tablet Commonly known as:  TYLENOL Take 2 tablets (650 mg total) by mouth every 6 (six) hours as needed for mild pain (or Fever >/= 101).   albuterol (2.5 MG/3ML) 0.083% nebulizer solution Commonly known as:  PROVENTIL Inhale 3 mLs into the lungs every 6 (six) hours as needed for wheezing or shortness of breath.   ANORO ELLIPTA 62.5-25 MCG/INH Aepb Generic drug:  umeclidinium-vilanterol Inhale 1 puff into the lungs daily.   ARIPiprazole 5 MG tablet Commonly known as:  ABILIFY Take 5 mg by mouth at bedtime.   budesonide 0.5 MG/2ML nebulizer solution Commonly known as:  PULMICORT Take 2 mLs (0.5 mg total) 2 (two) times daily by nebulization.   buPROPion 150 MG 24 hr tablet Commonly known as:  WELLBUTRIN XL Take 150 mg by mouth daily.   clonazePAM 1 MG tablet Commonly known as:  KLONOPIN Take 1 tablet (1 mg total) by mouth  2 (two) times daily.   clopidogrel 75 MG tablet Commonly known as:  PLAVIX Take 1 tablet (75 mg total) by mouth daily.   dextromethorphan 30 MG/5ML liquid Commonly known as:  DELSYM Take 5 mLs (30 mg total) by mouth 2 (two) times daily.   docusate sodium 100 MG capsule Commonly known as:  COLACE Take 1 capsule (100 mg total) by mouth 2 (two) times daily.   doxycycline 100 MG tablet Commonly known as:  VIBRA-TABS Take 1 tablet (100 mg total) by mouth every 12 (twelve) hours.   FLUoxetine 40 MG  capsule Commonly known as:  PROZAC Take 80 mg by mouth daily.   fluticasone 50 MCG/ACT nasal spray Commonly known as:  FLONASE Place 2 sprays into both nostrils daily.   furosemide 20 MG tablet Commonly known as:  LASIX Take 1 tablet (20 mg total) by mouth 2 (two) times daily.   gabapentin 100 MG capsule Commonly known as:  NEURONTIN Take 2 capsules (200 mg total) by mouth 3 (three) times daily.   guaiFENesin 600 MG 12 hr tablet Commonly known as:  MUCINEX Take 1 tablet (600 mg total) by mouth 2 (two) times daily.   ipratropium-albuterol 0.5-2.5 (3) MG/3ML Soln Commonly known as:  DUONEB Take 3 mLs by nebulization 4 (four) times daily.   liothyronine 25 MCG tablet Commonly known as:  CYTOMEL Take 25 mcg by mouth daily.   mirtazapine 15 MG tablet Commonly known as:  REMERON Take 7.5 mg by mouth at bedtime.   montelukast 10 MG tablet Commonly known as:  SINGULAIR Take 10 mg by mouth at bedtime.   omeprazole 20 MG capsule Commonly known as:  PRILOSEC Take 20 mg by mouth daily.   polyethylene glycol packet Commonly known as:  MIRALAX / GLYCOLAX Take 17 g by mouth daily.   potassium chloride 10 MEQ tablet Commonly known as:  K-DUR Take 10 mEq by mouth 2 (two) times daily.   simvastatin 40 MG tablet Commonly known as:  ZOCOR Take 40 mg by mouth at bedtime.   tiotropium 18 MCG inhalation capsule Commonly known as:  SPIRIVA Place 18 mcg into inhaler and inhale daily.   tiZANidine 2 MG tablet Commonly known as:  ZANAFLEX Take 1 tablet (2 mg total) by mouth every 6 (six) hours as needed for muscle spasms.   traMADol 50 MG tablet Commonly known as:  ULTRAM Take 1 tablet (50 mg total) by mouth every 6 (six) hours as needed for moderate pain.   Vitamin D (Ergocalciferol) 50000 units Caps capsule Commonly known as:  DRISDOL Take 50,000 Units by mouth every 30 (thirty) days.        DISCHARGE INSTRUCTIONS:  If you experience worsening of your admission  symptoms, develop shortness of breath, life threatening emergency, suicidal or homicidal thoughts you must seek medical attention immediately by calling 911 or calling your MD immediately  if symptoms less severe.  You Must read complete instructions/literature along with all the possible adverse reactions/side effects for all the Medicines you take and that have been prescribed to you. Take any new Medicines after you have completely understood and accept all the possible adverse reactions/side effects.   Please note  You were cared for by a hospitalist during your hospital stay. If you have any questions about your discharge medications or the care you received while you were in the hospital after you are discharged, you can call the unit and asked to speak with the hospitalist on call if the hospitalist that  took care of you is not available. Once you are discharged, your primary care physician will handle any further medical issues. Please note that NO REFILLS for any discharge medications will be authorized once you are discharged, as it is imperative that you return to your primary care physician (or establish a relationship with a primary care physician if you do not have one) for your aftercare needs so that they can reassess your need for medications and monitor your lab values.    Today   CHIEF COMPLAINT:   Chief Complaint  Patient presents with  . Wound Infection    HISTORY OF PRESENT ILLNESS:  77 y.o. female with a known history of cardiomyopathy, congestive heart failure, chronic kidney disease, COPD, diabetes mellitus type 2, hypertension, arthritis, sleep apnea presented to the emergency room with discharge from the buttock area.  Patient came evaluation for pain and discharge in the left buttock area.  She was seen in the emergency room she was found to have abscess in the left buttock perirectal area.  Incision and drainage was done in the emergency room and patient was started  on IV vancomycin antibiotic.  Patient is usually bedbound she moves with the help of wheelchair.  Patient gets onto the wheelchair with the help of a Hoyer lift at home.  No complaints of any chest pain, shortness of breath.  On oxygen via nasal cannula at 3 L at home.  Hospitalist service was consulted for further care.  VITAL SIGNS:  Blood pressure 137/82, pulse 80, temperature 97.7 F (36.5 C), temperature source Oral, resp. rate 20, height 5\' 5"  (1.651 m), weight 96.6 kg (213 lb), SpO2 100 %.  I/O:    Intake/Output Summary (Last 24 hours) at 06/05/2018 1122 Last data filed at 06/05/2018 0517 Gross per 24 hour  Intake 3 ml  Output 2700 ml  Net -2697 ml    PHYSICAL EXAMINATION:  GENERAL:  77 y.o.-year-old patient lying in the bed with no acute distress.  EYES: Pupils equal, round, reactive to light and accommodation. No scleral icterus. Extraocular muscles intact.  HEENT: Head atraumatic, normocephalic. Oropharynx and nasopharynx clear.  NECK:  Supple, no jugular venous distention. No thyroid enlargement, no tenderness.  LUNGS: Normal breath sounds bilaterally, no wheezing, rales,rhonchi or crepitation. No use of accessory muscles of respiration.  CARDIOVASCULAR: S1, S2 normal. No murmurs, rubs, or gallops.  ABDOMEN: Soft, non-tender, non-distended. Bowel sounds present. No organomegaly or mass.  EXTREMITIES: No pedal edema, cyanosis, or clubbing.  NEUROLOGIC: Cranial nerves II through XII are intact. Muscle strength 5/5 in all extremities. Sensation intact. Gait not checked.  PSYCHIATRIC: The patient is alert and oriented x 3.  SKIN: No obvious rash, lesion, or ulcer.   DATA REVIEW:   CBC Recent Labs  Lab 06/03/18 0552  WBC 8.3  HGB 9.9*  HCT 29.1*  PLT 231    Chemistries  Recent Labs  Lab 06/02/18 1146 06/03/18 0552  NA 136 137  K 3.8 3.6  CL 102 102  CO2 25 26  GLUCOSE 116* 113*  BUN 12 12  CREATININE 0.67 0.63  CALCIUM 9.0 8.7*  AST 12*  --   ALT 9*  --    ALKPHOS 72  --   BILITOT 0.8  --     Cardiac Enzymes No results for input(s): TROPONINI in the last 168 hours.  Microbiology Results  Results for orders placed or performed during the hospital encounter of 06/02/18  Aerobic/Anaerobic Culture (surgical/deep wound)     Status:  None (Preliminary result)   Collection Time: 06/02/18 11:46 AM  Result Value Ref Range Status   Specimen Description   Final    ABSCESS Performed at [facility], [address] [place], [place] [address]    Special Requests   Final    Normal Performed at [facility], [place], [place] [address]    Gram Stain   Final    RARE WBC PRESENT, PREDOMINANTLY PMN FEW GRAM POSITIVE COCCI    Culture   Final    MODERATE METHICILLIN RESISTANT STAPHYLOCOCCUS AUREUS NO ANAEROBES ISOLATED; CULTURE IN PROGRESS FOR 5 DAYS    Report Status PENDING  Incomplete   Organism ID, Bacteria METHICILLIN RESISTANT STAPHYLOCOCCUS AUREUS  Final      Susceptibility   Methicillin resistant staphylococcus aureus - MIC*    CIPROFLOXACIN >=8 RESISTANT Resistant     ERYTHROMYCIN >=8 RESISTANT Resistant     GENTAMICIN <=0.5 SENSITIVE Sensitive     OXACILLIN >=4 RESISTANT Resistant     TETRACYCLINE <=1 SENSITIVE Sensitive     VANCOMYCIN 1 SENSITIVE Sensitive     TRIMETH/SULFA <=10 SENSITIVE Sensitive     CLINDAMYCIN <=0.25 SENSITIVE Sensitive     RIFAMPIN <=0.5 SENSITIVE Sensitive     Inducible Clindamycin NEGATIVE Sensitive     * MODERATE METHICILLIN RESISTANT STAPHYLOCOCCUS AUREUS  MRSA PCR Screening     Status: Abnormal   Collection Time: 06/02/18  5:23 PM  Result Value Ref Range Status   MRSA by PCR POSITIVE (A) NEGATIVE Final    Comment:        The GeneXpert MRSA Assay (FDA approved for NASAL specimens only), is one component of a comprehensive MRSA colonization surveillance program. It is not intended to diagnose MRSA infection nor to guide or monitor treatment for MRSA  infections. RESULT CALLED TO, READ BACK BY AND VERIFIED WITH: TRISHA KING @1949  06/02/18 AKT Performed at [facility], [address] [place], [place] [address]     RADIOLOGY:  No results found.  EKG:   Orders placed or performed during the hospital encounter of 04/15/18  . EKG 12-Lead  . EKG 12-Lead  . ED EKG  . ED EKG  . EKG 12-Lead  . EKG 12-Lead  . EKG 12-Lead  . EKG 12-Lead  . EKG 12-Lead  . EKG 12-Lead  . EKG 12-Lead  . EKG 12-Lead  . EKG 12-Lead  . EKG 12-Lead  . EKG 12-Lead  . EKG 12-Lead  . EKG      Management plans discussed with the patient, family and they are in agreement.  CODE STATUS:     Code Status Orders  (From admission, onward)        Start     Ordered   06/02/18 1531  Full code  Continuous     06/02/18 1530    Code Status History    Date Active Date Inactive Code Status Order ID Comments User Context   04/15/2018 0831 04/22/2018 2121 Full Code [id number]  Harrie Foreman, MD Inpatient   03/23/2018 1813 03/27/2018 2027 Full Code [id number]  Henreitta Leber, MD Inpatient   01/04/2018 1228 01/05/2018 2024 Full Code [id number]  Bettey Costa, MD Inpatient   10/24/2017 0523 10/28/2017 1601 Full Code [id number]  Saundra Shelling, MD Inpatient   08/20/2017 0134 08/21/2017 1828 Full Code [id number]  Lance Coon, MD Inpatient   10/03/2016 0540 10/04/2016 1711 Full Code [id number]  Harrie Foreman, MD Inpatient   11/15/2015 2223 11/19/2015 1514 Full  Code [id number]  Hower, Aaron Mose, MD ED    Advance Directive Documentation     Most Recent Value  Type of Advance Directive  Healthcare Power of [place], Living will  Pre-existing out of facility DNR order (yellow form or pink MOST form)  -  "MOST" Form in Place?  -      TOTAL TIME TAKING CARE OF THIS PATIENT: 45 minutes.    Avel Peace Carole Deere M.D on 06/05/2018 at 11:22 AM  Between 7am to 6pm - Pager - [phone]  After 6pm go to [email or web address] - password [email or web address]  Office  [phone]  CC: Primary care physician; Marygrace Drought, MD   Note: This dictation was prepared with Dragon dictation along with smaller phrase technology. Any transcriptional errors that result from this process are unintentional.

## 2018-06-05 NOTE — Care Management Note (Signed)
Case Management Note  Patient Details  Name: Sara Pierce MRN: [id number] Date of Birth: 1941-08-12   Patient admitted for perirectal abscess.   Patient Lives with husband. Sees Dr. Marygrace Drought at [facility] in [place]  Patient open with [facility]. Patient states that she has 4 hour of PCS services through [employer].  Patient has hospital bed, manual WC, and hoywer lift in the home.  Patient also has chronic Home O2.    Joelene Millin with [facility] notified of discharge.  Bedside RN has obtained [employer] from supply chain prior to discharge.     Subjective/Objective:                    Action/Plan:   Expected Discharge Date:  06/05/18               Expected Discharge Plan:  [place]  In-House Referral:     Discharge planning Services  CM Consult  Post Acute Care Choice:  Resumption of Svcs/PTA Provider Choice offered to:     DME Arranged:    DME Agency:     HH Arranged:  RN, PT, Nurse's Aide, Social Work [employer] Agency:  [facility]  Status of Service:  Completed, signed off  If discussed at [employer], dates discussed:    Additional Comments:  Beverly Sessions, RN 06/05/2018, 1:53 PM

## 2018-06-05 NOTE — Discharge Instructions (Signed)
Dressing change instructions  Apply Iodoform packing to buttock wound Q day, using swab to fill, then cover with foam dressing.  (Change foam dressing Q 3 days or PRN soiling.)

## 2018-06-05 NOTE — Progress Notes (Signed)
RN call pt's husband to notified pt is going to be discharge via EMS. Pt's husband will be at his house waiting for her.

## 2018-06-05 NOTE — Care Management Important Message (Signed)
Important Message  Patient Details  Name: Sara Pierce MRN: [id number] Date of Birth: March 13, 1941   Medicare Important Message Given:  Yes    Beverly Sessions, RN 06/05/2018, 11:48 AM

## 2018-06-07 ENCOUNTER — Telehealth: Payer: Self-pay

## 2018-06-07 NOTE — Telephone Encounter (Signed)
EMMI Follow-up: Received a call from Mr. Sara Pierce said they had missed the call as his phone was charging.  Said everything was going okay for his wife.  I let him know she would be receiving a second automated call with a different series of questions and to let us know if they have any questions or concerns at that time. No needs noted for today.

## 2018-06-08 LAB — AEROBIC/ANAEROBIC CULTURE W GRAM STAIN (SURGICAL/DEEP WOUND): Special Requests: NORMAL

## 2018-06-08 LAB — AEROBIC/ANAEROBIC CULTURE (SURGICAL/DEEP WOUND)

## 2018-06-27 ENCOUNTER — Ambulatory Visit: Payer: Medicaid Other | Admitting: Family

## 2018-06-28 ENCOUNTER — Ambulatory Visit: Payer: Medicaid Other | Admitting: Family

## 2018-06-30 ENCOUNTER — Other Ambulatory Visit: Payer: Self-pay

## 2018-06-30 ENCOUNTER — Emergency Department
Admission: EM | Admit: 2018-06-30 | Discharge: 2018-07-01 | Disposition: A | Discharge status: Home / Self Care | Payer: Medicare Other | Attending: Emergency Medicine | Admitting: Emergency Medicine

## 2018-06-30 ENCOUNTER — Emergency Department: Payer: Medicare Other

## 2018-06-30 DIAGNOSIS — N189 Chronic kidney disease, unspecified: Secondary | ICD-10-CM | POA: Insufficient documentation

## 2018-06-30 DIAGNOSIS — S50311D Abrasion of right elbow, subsequent encounter: Secondary | ICD-10-CM | POA: Insufficient documentation

## 2018-06-30 DIAGNOSIS — Z7902 Long term (current) use of antithrombotics/antiplatelets: Secondary | ICD-10-CM | POA: Diagnosis not present

## 2018-06-30 DIAGNOSIS — Z9660 Presence of unspecified orthopedic joint implant: Secondary | ICD-10-CM | POA: Diagnosis not present

## 2018-06-30 DIAGNOSIS — J449 Chronic obstructive pulmonary disease, unspecified: Secondary | ICD-10-CM | POA: Diagnosis not present

## 2018-06-30 DIAGNOSIS — I130 Hypertensive heart and chronic kidney disease with heart failure and stage 1 through stage 4 chronic kidney disease, or unspecified chronic kidney disease: Secondary | ICD-10-CM | POA: Insufficient documentation

## 2018-06-30 DIAGNOSIS — I5032 Chronic diastolic (congestive) heart failure: Secondary | ICD-10-CM | POA: Insufficient documentation

## 2018-06-30 DIAGNOSIS — Z79899 Other long term (current) drug therapy: Secondary | ICD-10-CM | POA: Diagnosis not present

## 2018-06-30 DIAGNOSIS — Z87891 Personal history of nicotine dependence: Secondary | ICD-10-CM | POA: Insufficient documentation

## 2018-06-30 DIAGNOSIS — L03113 Cellulitis of right upper limb: Secondary | ICD-10-CM | POA: Insufficient documentation

## 2018-06-30 DIAGNOSIS — W268XXD Contact with other sharp object(s), not elsewhere classified, subsequent encounter: Secondary | ICD-10-CM | POA: Diagnosis not present

## 2018-06-30 LAB — CBC WITH DIFFERENTIAL/PLATELET
BASOS ABS: 0.1 10*3/uL (ref 0–0.1)
Basophils Relative: 1 %
Eosinophils Absolute: 0.1 10*3/uL (ref 0–0.7)
Eosinophils Relative: 1 %
HEMATOCRIT: 31.8 % — AB (ref 35.0–47.0)
Hemoglobin: 11 g/dL — ABNORMAL LOW (ref 12.0–16.0)
LYMPHS ABS: 1.7 10*3/uL (ref 1.0–3.6)
LYMPHS PCT: 18 %
MCH: 31.9 pg (ref 26.0–34.0)
MCHC: 34.5 g/dL (ref 32.0–36.0)
MCV: 92.5 fL (ref 80.0–100.0)
MONO ABS: 0.5 10*3/uL (ref 0.2–0.9)
MONOS PCT: 5 %
NEUTROS ABS: 7.2 10*3/uL — AB (ref 1.4–6.5)
Neutrophils Relative %: 75 %
PLATELETS: 218 10*3/uL (ref 150–440)
RBC: 3.44 MIL/uL — ABNORMAL LOW (ref 3.80–5.20)
RDW: 17.3 % — AB (ref 11.5–14.5)
WBC: 9.6 10*3/uL (ref 3.6–11.0)

## 2018-06-30 LAB — COMPREHENSIVE METABOLIC PANEL
ALBUMIN: 3.6 g/dL (ref 3.5–5.0)
ALT: 10 U/L (ref 0–44)
ANION GAP: 6 (ref 5–15)
AST: 17 U/L (ref 15–41)
Alkaline Phosphatase: 79 U/L (ref 38–126)
BILIRUBIN TOTAL: 0.6 mg/dL (ref 0.3–1.2)
BUN: 20 mg/dL (ref 8–23)
CHLORIDE: 98 mmol/L (ref 98–111)
CO2: 33 mmol/L — ABNORMAL HIGH (ref 22–32)
Calcium: 9 mg/dL (ref 8.9–10.3)
Creatinine, Ser: 0.71 mg/dL (ref 0.44–1.00)
GFR calc Af Amer: >60 mL/min (ref >60)
Glucose, Bld: 113 mg/dL — ABNORMAL HIGH (ref 70–99)
POTASSIUM: 4.4 mmol/L (ref 3.5–5.1)
Sodium: 137 mmol/L (ref 135–145)
TOTAL PROTEIN: 6.7 g/dL (ref 6.5–8.1)

## 2018-06-30 LAB — LACTIC ACID, PLASMA: Lactic Acid, Venous: 1 mmol/L (ref 0.5–1.9)

## 2018-06-30 MED ORDER — SULFAMETHOXAZOLE-TRIMETHOPRIM 800-160 MG PO TABS
1.0000 | ORAL_TABLET | Freq: Once | ORAL | Status: AC
  Administered 2018-06-30: 1 via ORAL
  Filled 2018-06-30: qty 1

## 2018-06-30 MED ORDER — CEPHALEXIN 500 MG PO CAPS: 500.0000 mg | ORAL_CAPSULE | Freq: Three times a day (TID) | ORAL | 0 refills | Status: AC

## 2018-06-30 MED ORDER — SODIUM CHLORIDE 0.9 % IV SOLN
1.0000 g | Freq: Once | INTRAVENOUS | Status: AC
  Administered 2018-06-30: 1 g via INTRAVENOUS
  Filled 2018-06-30: qty 10

## 2018-06-30 MED ORDER — SULFAMETHOXAZOLE-TRIMETHOPRIM 800-160 MG PO TABS: 1.0000 | ORAL_TABLET | Freq: Two times a day (BID) | ORAL | 0 refills | Status: AC

## 2018-06-30 MED ORDER — ACETAMINOPHEN 500 MG PO TABS
1000.0000 mg | ORAL_TABLET | Freq: Once | ORAL | Status: AC
  Administered 2018-06-30: 1000 mg via ORAL
  Filled 2018-06-30: qty 2

## 2018-06-30 MED ORDER — ALBUTEROL SULFATE (2.5 MG/3ML) 0.083% IN NEBU
2.5000 mg | INHALATION_SOLUTION | Freq: Once | RESPIRATORY_TRACT | Status: AC
  Administered 2018-06-30: 2.5 mg via RESPIRATORY_TRACT
  Filled 2018-06-30: qty 3

## 2018-06-30 NOTE — ED Notes (Signed)
Pt states she needs an albuterol neb. Pt states is time for albuterol at home. md notified. Order for albuterol received.

## 2018-06-30 NOTE — ED Notes (Signed)
Pt continues to sleep, awaiting ems transport.

## 2018-06-30 NOTE — Discharge Instructions (Signed)

## 2018-06-30 NOTE — ED Provider Notes (Signed)
[facility] Emergency Department Provider Note  ____________________________________________  Time seen: Approximately 9:14 PM  I have reviewed the triage vital signs and the nursing notes.   HISTORY  Chief Complaint Wound Check   HPI Sara Pierce is a 77 y.o. female with history as listed below who presents for evaluation of pain and infection of her right elbow.  Patient reports that 2 weeks ago she scraped her elbow onto the metal of her bed.  Today she noticed that the area was getting red and warm.  The redness has spread.  She has had chills but no fever.  No nausea or vomiting.  She is complaining of a constant sharp pain that she rates as 5 out of 10 at this time.  She has no pain with flexion and extension of the elbow.  She is not a diabetic.  Past Medical History:  Diagnosis Date  . Arthritis   . Cardiomyopathy ([place])   . Cataract   . Cerebral hemorrhage ([place])   . CHF (congestive heart failure) ([place])   . Chronic kidney disease   . Clotting disorder ([place])   . COPD (chronic obstructive pulmonary disease) ([place])   . Depression   . Diabetes mellitus without complication ([place])   . Hypertension   . Mixed incontinence   . Obesity   . Sleep apnea   . Thyroid disease   . Urinary frequency   . Vaginal atrophy   . Varicose veins     Patient Active Problem List   Diagnosis Date Noted  . Perirectal abscess 06/02/2018  . Chronic diastolic heart failure ([place]) 05/12/2018  . NSTEMI (non-ST elevated myocardial infarction) ([place]) 04/15/2018  . COPD exacerbation ([place]) 03/23/2018  . COPD (chronic obstructive pulmonary disease) ([place]) 01/04/2018  . Pressure injury of skin 10/25/2017  . Absolute anemia 03/15/2015  . Anxiety 03/15/2015  . Arthritis 03/15/2015  . Cataract 03/15/2015  . Brain bleed ([place]) 03/15/2015  . Chronic constipation 03/15/2015  . Blood clotting disorder ([place]) 03/15/2015  . CAFL (chronic airflow limitation) ([place]) 03/15/2015  .  Clinical depression 03/15/2015  . Diabetes ([place]) 03/15/2015  . H/O varicella 03/15/2015  . BP (high blood pressure) 03/15/2015  . Mixed incontinence 03/15/2015  . 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 Adiposity 03/15/2015  . Apnea, sleep 03/15/2015  . Disease of thyroid gland 03/15/2015  . FOM (frequency of micturition) 03/15/2015  . 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 Candida vaginitis 03/15/2015  . Abnormal ECG 03/15/2015    Past Surgical History:  Procedure Laterality Date  . APPENDECTOMY    . Cardiac Catherization    . COLONOSCOPY WITH PROPOFOL N/A 04/28/2016   Procedure: COLONOSCOPY WITH PROPOFOL;  Surgeon: Manya Silvas, MD;  Location: [facility] ENDOSCOPY;  Service: Endoscopy;  Laterality: N/A;  . COLONOSCOPY WITH PROPOFOL N/A 04/29/2016   Procedure: COLONOSCOPY WITH PROPOFOL;  Surgeon: Manya Silvas, MD;  Location: [facility] ENDOSCOPY;  Service: Endoscopy;  Laterality: N/A;  . JOINT REPLACEMENT    . STRABISMUS SURGERY    . TONSILLECTOMY    . TUBAL LIGATION      Prior to Admission medications   Medication Sig Start Date End Date Taking? Authorizing Provider  acetaminophen (TYLENOL) 325 MG tablet Take 2 tablets (650 mg total) by mouth every 6 (six) hours as needed for mild pain (or Fever >/= 101). Patient not taking: Reported on 06/02/2018 04/22/18   Nicholes Mango, MD  albuterol (PROVENTIL) (2.5 MG/3ML) 0.083% nebulizer solution  Inhale 3 mLs into the lungs every 6 (six) hours as needed for wheezing or shortness of breath. 04/22/18   Gouru, Illene Silver, MD  ARIPiprazole (ABILIFY) 5 MG tablet Take 5 mg by mouth at bedtime.    [provider]  budesonide (PULMICORT) 0.5 MG/2ML nebulizer solution Take 2 mLs (0.5 mg total) 2 (two) times daily by nebulization. 10/28/17   Demetrios Loll, MD  buPROPion (WELLBUTRIN XL) 150 MG 24 hr tablet Take 150 mg by mouth daily.    [provider]  cephALEXin (KEFLEX) 500 MG capsule Take 1 capsule (500 mg  total) by mouth 3 (three) times daily for 7 days. 06/30/18 07/07/18  Rudene Re, MD  clonazePAM (KLONOPIN) 1 MG tablet Take 1 tablet (1 mg total) by mouth 2 (two) times daily. Patient not taking: Reported on 06/02/2018 04/22/18   Nicholes Mango, MD  clopidogrel (PLAVIX) 75 MG tablet Take 1 tablet (75 mg total) by mouth daily. 04/23/18   Nicholes Mango, MD  dextromethorphan (DELSYM) 30 MG/5ML liquid Take 5 mLs (30 mg total) by mouth 2 (two) times daily. 04/22/18   Nicholes Mango, MD  docusate sodium (COLACE) 100 MG capsule Take 1 capsule (100 mg total) by mouth 2 (two) times daily. Patient not taking: Reported on 06/02/2018 04/22/18   Nicholes Mango, MD  doxycycline (VIBRA-TABS) 100 MG tablet Take 1 tablet (100 mg total) by mouth every 12 (twelve) hours. 06/05/18   Salary, Avel Peace, MD  FLUoxetine (PROZAC) 40 MG capsule Take 80 mg by mouth daily.    [provider]  fluticasone (FLONASE) 50 MCG/ACT nasal spray Place 2 sprays into both nostrils daily.    [provider]  furosemide (LASIX) 20 MG tablet Take 1 tablet (20 mg total) by mouth 2 (two) times daily. 04/22/18   Nicholes Mango, MD  gabapentin (NEURONTIN) 100 MG capsule Take 2 capsules (200 mg total) by mouth 3 (three) times daily. 04/22/18   Nicholes Mango, MD  guaiFENesin (MUCINEX) 600 MG 12 hr tablet Take 1 tablet (600 mg total) by mouth 2 (two) times daily. 04/22/18   Gouru, Illene Silver, MD  ipratropium-albuterol (DUONEB) 0.5-2.5 (3) MG/3ML SOLN Take 3 mLs by nebulization 4 (four) times daily.    [provider]  liothyronine (CYTOMEL) 25 MCG tablet Take 25 mcg by mouth daily.     [provider]  mirtazapine (REMERON) 15 MG tablet Take 7.5 mg by mouth at bedtime.    [provider]  montelukast (SINGULAIR) 10 MG tablet Take 10 mg by mouth at bedtime.     [provider]  omeprazole (PRILOSEC) 20 MG capsule Take 20 mg by mouth daily.    [provider]  polyethylene glycol (MIRALAX / GLYCOLAX)  packet Take 17 g by mouth daily.    [provider]  potassium chloride (K-DUR) 10 MEQ tablet Take 10 mEq by mouth 2 (two) times daily.     [provider]  simvastatin (ZOCOR) 40 MG tablet Take 40 mg by mouth at bedtime.     [provider]  sulfamethoxazole-trimethoprim (BACTRIM DS,SEPTRA DS) 800-160 MG tablet Take 1 tablet by mouth 2 (two) times daily for 10 days. 06/30/18 07/10/18  Rudene Re, MD  tiotropium (SPIRIVA) 18 MCG inhalation capsule Place 18 mcg into inhaler and inhale daily.    [provider]  tiZANidine (ZANAFLEX) 2 MG tablet Take 1 tablet (2 mg total) by mouth every 6 (six) hours as needed for muscle spasms. 04/22/18   Nicholes Mango, MD  traMADol Veatrice [place])  50 MG tablet Take 1 tablet (50 mg total) by mouth every 6 (six) hours as needed for moderate pain. Patient not taking: Reported on 06/02/2018 04/22/18   Nicholes Mango, MD  umeclidinium-vilanterol (ANORO ELLIPTA) 62.5-25 MCG/INH AEPB Inhale 1 puff into the lungs daily.    [provider]  Vitamin D, Ergocalciferol, (DRISDOL) 50000 units CAPS capsule Take 50,000 Units by mouth every 30 (thirty) days.    [provider]    Allergies Aspirin; Codeine; Naprosyn [naproxen]; Other; Tape; and Valacyclovir  Family History  Problem Relation Age of Onset  . Heart attack Father   . Coronary artery disease Father   . Hypertension Father   . Hyperlipidemia Father   . Heart attack Mother   . Hypertension Mother   . Hyperlipidemia Mother   . Breast cancer Neg Hx     Social History Social History   Tobacco Use  . Smoking status: Former Smoker    Packs/day: 0.50    Years: 44.00    Pack years: 22.00    Types: Cigarettes  . Smokeless tobacco: Never Used  Substance Use Topics  . Alcohol use: No  . Drug use: No    Review of Systems  Constitutional: Negative for fever. Eyes: Negative for visual changes. ENT: Negative for sore throat. Neck: No neck pain    Cardiovascular: Negative for chest pain. Respiratory: Negative for shortness of breath. Gastrointestinal: Negative for abdominal pain, vomiting or diarrhea. Genitourinary: Negative for dysuria. Musculoskeletal: Negative for back pain. + R elbow pain and redness Skin: Negative for rash. Neurological: Negative for headaches, weakness or numbness. Psych: No SI or HI  ____________________________________________   PHYSICAL EXAM:  VITAL SIGNS: ED Triage Vitals  Enc Vitals Group     BP 06/30/18 2103 (!) 107/45     Pulse Rate 06/30/18 2103 72     Resp 06/30/18 2103 (!) 22     Temp 06/30/18 2103 98.1 F (36.7 C)     Temp Source 06/30/18 2103 Oral     SpO2 06/30/18 2103 97 %     Weight 06/30/18 2104 210 lb (95.3 kg)     Height 06/30/18 2104 5\' 5"  (1.651 m)     Head Circumference --      Peak Flow --      Pain Score 06/30/18 2104 6     Pain Loc --      Pain Edu? --      Excl. in [place]? --     Constitutional: Alert and oriented. Well appearing and in no apparent distress. HEENT:      Head: Normocephalic and atraumatic.         Eyes: Conjunctivae are normal. Sclera is non-icteric.       Mouth/Throat: Mucous membranes are moist.       Neck: Supple with no signs of meningismus. Cardiovascular: Regular rate and rhythm. No murmurs, gallops, or rubs. 2+ symmetrical distal pulses are present in all extremities. No JVD. Respiratory: Normal respiratory effort. Lungs are clear to auscultation bilaterally. No wheezes, crackles, or rhonchi.  Gastrointestinal: Soft, non tender, and non distended with positive bowel sounds. No rebound or guarding. Musculoskeletal: There is a superficial abrasion on the lateral aspect of the right elbow with overlying erythema and warmth, tenderness to palpation in the area.  There is no fluctuance or abscess, she has painless full range of motion of the elbow Neurologic: Normal speech and language. Face is symmetric. Moving all extremities. No gross focal  neurologic deficits are appreciated. Skin:  Skin is warm, dry and intact. No rash noted. Psychiatric: Mood and affect are normal. Speech and behavior are normal.      ____________________________________________   LABS (all labs ordered are listed, but only abnormal results are displayed)  Labs Reviewed  COMPREHENSIVE METABOLIC PANEL - Abnormal; Notable for the following components:      Result Value   CO2 33 (*)    Glucose, Bld 113 (*)    All other components within normal limits  CBC WITH DIFFERENTIAL/PLATELET - Abnormal; Notable for the following components:   RBC 3.44 (*)    Hemoglobin 11.0 (*)    HCT 31.8 (*)    RDW 17.3 (*)    Neutro Abs 7.2 (*)    All other components within normal limits  LACTIC ACID, PLASMA   ____________________________________________  EKG  none  ____________________________________________  RADIOLOGY  I have personally reviewed the images performed during this visit and I agree with the Radiologist's read.   Interpretation by Radiologist:  Dg Elbow Complete Right  Result Date: 06/30/2018 CLINICAL DATA:  Right elbow erythema. Scratch injury with wound 1 week ago. Purulent drainage from the right lateral elbow. EXAM: RIGHT ELBOW - COMPLETE 3+ VIEW COMPARISON:  None. FINDINGS: Spurring along the medial and lateral epicondyles and also along the coronoid process and radial head. 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 No elbow joint effusion. There may be a small chronically fragmented ossicle along the coronoid process. Minimal soft tissue swelling overlying the olecranon. No gas in the soft tissues. IMPRESSION: 1. Degenerative findings in the elbow without elbow joint effusion or gas in the soft tissues. No acute bony findings. Electronically Signed   By: Van Clines M.D.   On: 06/30/2018 21:41       ____________________________________________   PROCEDURES  Procedure(s) performed: None Procedures Critical Care performed:  None ____________________________________________   INITIAL IMPRESSION / ASSESSMENT AND PLAN / ED COURSE   77 y.o. female with history as listed below who presents for evaluation of cellulitis of the right elbow after sustaining a abrasion on her bed 2 weeks ago.  Right elbow has a superficial abrasion with overlying warmth erythema and tenderness.  There is no clinical suspicion of septic joint.  There is no crepitus, there is no evidence of septic joint or abscess.  Patient is afebrile with normal vital signs.  Will check labs including CBC, BMP and lactic acid.  Patient does have a history of MRSA and although this infection seems to be nonpurulent I will cover for MRSA and strep with Bactrim and Rocephin.  Will give Tylenol for pain    _________________________ 11:18 PM on 06/30/2018 -----------------------------------------  Normal lactic, normal white count.  No signs of sepsis.  Wound was demarcated.  Patient was given Rocephin and Bactrim.  Will be discharged home on Keflex and Bactrim.  Discussed return precautions for worsening infection, fever or chills.  Otherwise she will follow-up with her primary care doctor Monday.   As part of my medical decision making, I reviewed the following data within the [place] notes reviewed and incorporated, Labs reviewed , Old chart reviewed, Radiograph reviewed , Notes from prior ED visits and [place] Controlled Substance Database    Pertinent labs & imaging results that were available during my care of the patient were reviewed by me and considered in my medical decision making (see chart for details).    ____________________________________________   FINAL  CLINICAL IMPRESSION(S) / ED DIAGNOSES  Final diagnoses:  Cellulitis of right upper extremity      NEW MEDICATIONS STARTED  DURING THIS VISIT:  ED Discharge Orders        Ordered    cephALEXin (KEFLEX) 500 MG capsule  3 times daily     06/30/18 2148    sulfamethoxazole-trimethoprim (BACTRIM DS,SEPTRA DS) 800-160 MG tablet  2 times daily     06/30/18 2148       Note:  This document was prepared using Dragon voice recognition software and may include unintentional dictation errors.    Alfred Levins, [place], MD 06/30/18 [id number]

## 2018-06-30 NOTE — ED Notes (Signed)
Pt sleeping. 

## 2018-06-30 NOTE — ED Triage Notes (Signed)
Pt from home with ems with right elbow redness. Pt has a wound from a "scratch" one week ago. Area is hot to touch, painful. Pt with scabbed wound noted with purulent drainage to right lateral elbow. Pt wears 3lpm oxygen chronically.

## 2018-07-10 ENCOUNTER — Ambulatory Visit: Payer: Medicaid Other | Admitting: Family

## 2018-07-17 ENCOUNTER — Ambulatory Visit: Payer: Medicare Other | Attending: Family | Admitting: Family

## 2018-07-17 ENCOUNTER — Encounter: Payer: Self-pay | Admitting: Family

## 2018-07-17 VITALS — BP 113/65 | HR 82 | Resp 18 | Ht 65.0 in | Wt 210.0 lb

## 2018-07-17 DIAGNOSIS — Z886 Allergy status to analgesic agent status: Secondary | ICD-10-CM | POA: Insufficient documentation

## 2018-07-17 DIAGNOSIS — F419 Anxiety disorder, unspecified: Secondary | ICD-10-CM | POA: Insufficient documentation

## 2018-07-17 DIAGNOSIS — I252 Old myocardial infarction: Secondary | ICD-10-CM | POA: Insufficient documentation

## 2018-07-17 DIAGNOSIS — J449 Chronic obstructive pulmonary disease, unspecified: Secondary | ICD-10-CM | POA: Diagnosis not present

## 2018-07-17 DIAGNOSIS — Z888 Allergy status to other drugs, medicaments and biological substances status: Secondary | ICD-10-CM | POA: Insufficient documentation

## 2018-07-17 DIAGNOSIS — E669 Obesity, unspecified: Secondary | ICD-10-CM | POA: Diagnosis not present

## 2018-07-17 DIAGNOSIS — N189 Chronic kidney disease, unspecified: Secondary | ICD-10-CM | POA: Insufficient documentation

## 2018-07-17 DIAGNOSIS — I130 Hypertensive heart and chronic kidney disease with heart failure and stage 1 through stage 4 chronic kidney disease, or unspecified chronic kidney disease: Secondary | ICD-10-CM | POA: Insufficient documentation

## 2018-07-17 DIAGNOSIS — I10 Essential (primary) hypertension: Secondary | ICD-10-CM

## 2018-07-17 DIAGNOSIS — Z79899 Other long term (current) drug therapy: Secondary | ICD-10-CM | POA: Insufficient documentation

## 2018-07-17 DIAGNOSIS — E079 Disorder of thyroid, unspecified: Secondary | ICD-10-CM | POA: Insufficient documentation

## 2018-07-17 DIAGNOSIS — I5032 Chronic diastolic (congestive) heart failure: Secondary | ICD-10-CM

## 2018-07-17 DIAGNOSIS — Z87891 Personal history of nicotine dependence: Secondary | ICD-10-CM | POA: Insufficient documentation

## 2018-07-17 DIAGNOSIS — R5383 Other fatigue: Secondary | ICD-10-CM | POA: Insufficient documentation

## 2018-07-17 DIAGNOSIS — J410 Simple chronic bronchitis: Secondary | ICD-10-CM

## 2018-07-17 DIAGNOSIS — F329 Major depressive disorder, single episode, unspecified: Secondary | ICD-10-CM | POA: Diagnosis not present

## 2018-07-17 DIAGNOSIS — E1122 Type 2 diabetes mellitus with diabetic chronic kidney disease: Secondary | ICD-10-CM | POA: Insufficient documentation

## 2018-07-17 DIAGNOSIS — Z7902 Long term (current) use of antithrombotics/antiplatelets: Secondary | ICD-10-CM | POA: Insufficient documentation

## 2018-07-17 DIAGNOSIS — G4733 Obstructive sleep apnea (adult) (pediatric): Secondary | ICD-10-CM | POA: Insufficient documentation

## 2018-07-17 DIAGNOSIS — I429 Cardiomyopathy, unspecified: Secondary | ICD-10-CM | POA: Diagnosis not present

## 2018-07-17 DIAGNOSIS — Z885 Allergy status to narcotic agent status: Secondary | ICD-10-CM | POA: Diagnosis not present

## 2018-07-17 NOTE — Patient Instructions (Addendum)
Continue weighing daily and call for an overnight weight gain of > 2 pounds or a weekly weight gain of >5 pounds.  Call [facility] pulmonology at [phone]

## 2018-07-17 NOTE — Progress Notes (Signed)
Patient ID: Sara Pierce, female    DOB: 04-17-1941, 77 y.o.   MRN: [id number]  HPI  Sara Pierce is a 77 y/o female with a history of clotting disorder, DM, HTN, CKD, thyroid disease, COPD, depression, obstructive sleep apnea, previous tobacco use and chronic heart failure.   Echo report from 04/16/18 reviewed and showed an EF of 55-65% along with trivial AR and mild MR.  Was in the ED 06/30/18 due to cellulitis or right elbow after abrasion 2 weeks prior. Labs normal, antibiotics were given and she was released. Admitted 06/02/18 due to large MRSA left perirectal abscess. Needed I & D, packing and antibiotics. Wound consult obtained. Discharged after 3 days. Admitted 04/15/18 due to NSTEMI. Cardiology consult obtained. CTA was negative for PE. Neurology consult also obtained as she had myoclonic jerks thought to be due to neurontin dosage. Antibiotics were given due to septic shock. She was discharged after 7 days. Admitted 03/23/18 due to COPD exacerbation. IV steroids initially given and then transitioned or oral prednisone. Antibiotics given for positive MRSA. Discharged to rehab after 4 days.  She presents today for a follow-up visit with a chief complaint of minimal fatigue upon moderate exertion. She says that this has been chronic in nature having been present for several years. She has associated cough, wheezing, dizziness and anxiety along with this. She denies any difficulty sleeping, abdominal distention, palpitations, pedal edema, chest pain, shortness of breath or weight gain. Would like the office number to [facility] pulmonology as she is thinking about switching to a different pulmonologist.   Past Medical History:  Diagnosis Date  . Arthritis   . Cardiomyopathy ([place])   . Cataract   . Cerebral hemorrhage ([place])   . CHF (congestive heart failure) ([place])   . Chronic kidney disease   . Clotting disorder ([place])   . COPD (chronic obstructive pulmonary disease) ([place])   . Depression   . Diabetes  mellitus without complication ([place])   . Hypertension   . Mixed incontinence   . Obesity   . Sleep apnea   . Thyroid disease   . Urinary frequency   . Vaginal atrophy   . Varicose veins    Past Surgical History:  Procedure Laterality Date  . APPENDECTOMY    . Cardiac Catherization    . COLONOSCOPY WITH PROPOFOL N/A 04/28/2016   Procedure: COLONOSCOPY WITH PROPOFOL;  Surgeon: Manya Silvas, MD;  Location: [facility] ENDOSCOPY;  Service: Endoscopy;  Laterality: N/A;  . COLONOSCOPY WITH PROPOFOL N/A 04/29/2016   Procedure: COLONOSCOPY WITH PROPOFOL;  Surgeon: Manya Silvas, MD;  Location: [facility] ENDOSCOPY;  Service: Endoscopy;  Laterality: N/A;  . JOINT REPLACEMENT    . STRABISMUS SURGERY    . TONSILLECTOMY    . TUBAL LIGATION     Family History  Problem Relation Age of Onset  . Heart attack Father   . Coronary artery disease Father   . Hypertension Father   . Hyperlipidemia Father   . Heart attack Mother   . Hypertension Mother   . Hyperlipidemia Mother   . Breast cancer Neg Hx    Social History   Tobacco Use  . Smoking status: Former Smoker    Packs/day: 0.50    Years: 44.00    Pack years: 22.00    Types: Cigarettes  . Smokeless tobacco: Never Used  Substance Use Topics  . Alcohol use: No   Allergies  Allergen Reactions  . Aspirin Swelling  . Codeine Itching  . Naprosyn [  Naproxen] Other (See Comments)    Per MAR pt allergic  . Other     Elastic Bandages/ Supports Per MAR  . Tape Hives  . Valacyclovir    Prior to Admission medications   Medication Sig Start Date End Date Taking? Authorizing Provider  acetaminophen (TYLENOL) 325 MG tablet Take 2 tablets (650 mg total) by mouth every 6 (six) hours as needed for mild pain (or Fever >/= 101). 04/22/18  Yes Gouru, Illene Silver, MD  albuterol (PROVENTIL) (2.5 MG/3ML) 0.083% nebulizer solution Inhale 3 mLs into the lungs every 6 (six) hours as needed for wheezing or shortness of breath. 04/22/18  Yes Gouru, Aruna, MD   ARIPiprazole (ABILIFY) 5 MG tablet Take 5 mg by mouth at bedtime.   Yes [provider]  budesonide (PULMICORT) 0.5 MG/2ML nebulizer solution Take 2 mLs (0.5 mg total) 2 (two) times daily by nebulization. 10/28/17  Yes Demetrios Loll, MD  buPROPion (WELLBUTRIN XL) 150 MG 24 hr tablet Take 150 mg by mouth daily.   Yes [provider]  clonazePAM (KLONOPIN) 1 MG tablet Take 1 tablet (1 mg total) by mouth 2 (two) times daily. 04/22/18  Yes Gouru, Illene Silver, MD  clopidogrel (PLAVIX) 75 MG tablet Take 1 tablet (75 mg total) by mouth daily. 04/23/18  Yes Gouru, Illene Silver, MD  dextromethorphan (DELSYM) 30 MG/5ML liquid Take 5 mLs (30 mg total) by mouth 2 (two) times daily. 04/22/18  Yes Gouru, Illene Silver, MD  docusate sodium (COLACE) 100 MG capsule Take 1 capsule (100 mg total) by mouth 2 (two) times daily. 04/22/18  Yes Gouru, Illene Silver, MD  FLUoxetine (PROZAC) 40 MG capsule Take 80 mg by mouth daily.   Yes [provider]  fluticasone (FLONASE) 50 MCG/ACT nasal spray Place 2 sprays into both nostrils daily.   Yes [provider]  furosemide (LASIX) 20 MG tablet Take 1 tablet (20 mg total) by mouth 2 (two) times daily. 04/22/18  Yes Gouru, Illene Silver, MD  gabapentin (NEURONTIN) 100 MG capsule Take 2 capsules (200 mg total) by mouth 3 (three) times daily. 04/22/18  Yes Gouru, Illene Silver, MD  guaiFENesin (MUCINEX) 600 MG 12 hr tablet Take 1 tablet (600 mg total) by mouth 2 (two) times daily. 04/22/18  Yes Gouru, Aruna, MD  ipratropium-albuterol (DUONEB) 0.5-2.5 (3) MG/3ML SOLN Take 3 mLs by nebulization 4 (four) times daily.   Yes [provider]  liothyronine (CYTOMEL) 25 MCG tablet Take 25 mcg by mouth daily.    Yes [provider]  mirtazapine (REMERON) 15 MG tablet Take 7.5 mg by mouth at bedtime.   Yes [provider]  montelukast (SINGULAIR) 10 MG tablet Take 10 mg by mouth at bedtime.    Yes [provider]  omeprazole (PRILOSEC) 20 MG capsule Take 20 mg by mouth  daily.   Yes [provider]  polyethylene glycol (MIRALAX / GLYCOLAX) packet Take 17 g by mouth daily.   Yes [provider]  potassium chloride (K-DUR) 10 MEQ tablet Take 10 mEq by mouth 2 (two) times daily.    Yes [provider]  simvastatin (ZOCOR) 40 MG tablet Take 40 mg by mouth at bedtime.    Yes [provider]  tiZANidine (ZANAFLEX) 2 MG tablet Take 1 tablet (2 mg total) by mouth every 6 (six) hours as needed for muscle spasms. 04/22/18  Yes Gouru, Illene Silver, MD  traMADol (ULTRAM) 50 MG tablet Take 1 tablet (50 mg total) by mouth every 6 (six) hours as needed for moderate pain. 04/22/18  Yes Gouru, Aruna, MD  umeclidinium-vilanterol (ANORO ELLIPTA) 62.5-25 MCG/INH AEPB Inhale 1 puff into the lungs daily.   Yes [provider]  Vitamin D, Ergocalciferol, (DRISDOL) 50000 units CAPS capsule Take 50,000 Units by mouth every 30 (thirty) days.   Yes [provider]    Review of Systems  Constitutional: Positive for fatigue. Negative for appetite change.  HENT: Negative for congestion, postnasal drip and sore throat.   Eyes: Negative.   Respiratory: Positive for cough (loose cough) and wheezing (at times). Negative for chest tightness and shortness of breath.   Cardiovascular: Negative for chest pain, palpitations and leg swelling.  Gastrointestinal: Negative for abdominal distention and abdominal pain.  Endocrine: Negative.   Genitourinary: Negative.   Musculoskeletal: Negative for back pain and neck pain.  Skin: Negative.   Allergic/Immunologic: Negative.   Neurological: Positive for dizziness (with vertigo). Negative for light-headedness.  Hematological: Negative for adenopathy. Bruises/bleeds easily.  Psychiatric/Behavioral: Negative for dysphoric mood and sleep disturbance (sleeping with oxygen on). The patient is nervous/anxious.    Vitals:   07/17/18 1432  BP: 113/65  Pulse: 82  Resp: 18  SpO2: 93%  Weight: 210 lb (95.3 kg)   Height: 5\' 5"  (1.651 m)   Wt Readings from Last 3 Encounters:  07/17/18 210 lb (95.3 kg)  06/30/18 210 lb (95.3 kg)  06/03/18 213 lb (96.6 kg)   Lab Results  Component Value Date   CREATININE 0.71 06/30/2018   CREATININE 0.63 06/03/2018   CREATININE 0.67 06/02/2018    Physical Exam  Constitutional: She is oriented to person, place, and time. She appears well-developed and well-nourished.  HENT:  Head: Normocephalic and atraumatic.  Neck: Normal range of motion. Neck supple. No JVD present.  Cardiovascular: Normal rate and regular rhythm.  Pulmonary/Chest: Effort normal. No respiratory distress. She has wheezes. She has no rales.  Abdominal: Soft. She exhibits no distension.  Musculoskeletal: She exhibits no edema or tenderness.  Neurological: She is alert and oriented to person, place, and time.  Skin: Skin is warm and dry.  Psychiatric: She has a normal mood and affect. Her behavior is normal. Thought content normal.  Nursing note and vitals reviewed.  Assessment & Plan:  1: Chronic heart failure with preserved ejection fraction- - NYHA class II - euvolemic today - being weighed inconsistently due to inability to stand without great assist. To call for an overnight weight gain of >2 pounds or a weekly weight gain of >5 pounds when she does weigh  - stated weight unchanged from last time she was here 2 months ago - not adding salt to her food. Reviewed the importance of closely following a 2000mg  sodium diet   - is not physically active as she's unable to stand and uses a hoyer lift at home to transfer - saw cardiology Sara Pierce) 03/23/18 - currently wearing oxygen at 3L around the clock although left it at home today - BNP 03/23/18 was 68.0  2: HTN- - BP looks good today - saw PCP (Sara Pierce) 06/08/18 - BMP on 06/30/18 reviewed and showed sodium 137, potassium 4.4 and GFR >60  3: COPD- - wearing oxygen at 3L - using nebulizer and inhalers - number given to  [facility] pulmonology per patient's request  Patient did not bring her medications nor a list. Each medication was verbally reviewed with the patient and she was encouraged to bring the bottles to every visit to confirm accuracy of list.  Patient opts to not make a return appointment at this  time. Advised her that she could call back at anytime to scheduled another appointment.

## 2018-07-27 ENCOUNTER — Ambulatory Visit (INDEPENDENT_AMBULATORY_CARE_PROVIDER_SITE_OTHER): Payer: Medicare Other | Admitting: Internal Medicine

## 2018-07-27 ENCOUNTER — Encounter: Payer: Self-pay | Admitting: Internal Medicine

## 2018-07-27 VITALS — BP 106/60 | HR 76 | Resp 16 | Ht 65.0 in

## 2018-07-27 DIAGNOSIS — J479 Bronchiectasis, uncomplicated: Secondary | ICD-10-CM

## 2018-07-27 MED ORDER — GUAIFENESIN ER 600 MG PO TB12: 600.0000 mg | ORAL_TABLET | Freq: Two times a day (BID) | ORAL | 1 refills | Status: DC

## 2018-07-27 MED ORDER — IPRATROPIUM-ALBUTEROL 0.5-2.5 (3) MG/3ML IN SOLN: 3.0000 mL | Freq: Four times a day (QID) | RESPIRATORY_TRACT | 11 refills | Status: DC

## 2018-07-27 NOTE — Progress Notes (Signed)
[facility] Consultation      Assessment and Plan:  COPD and bronchiectasis with exacerbation. Chronic excess mucus production. Intractable cough. -We will start an aggressive regimen to try to help with mucus clearance. - Patient started on duo nebs 3 times daily followed by 5 minutes of flutter valve use 3 times daily. - She started on Mucinex 600 mg 3 times daily, as well as Allegra-D. -We will check sputum culture. - If not improved at next visit will consider percussion vest, chronic azithromycin therapy, long-acting nebulizer treatments.  We discussed bronchoscopy, though with her comorbidities she would be at high risk of complications, therefore would like to avoid this.  Obstructive sleep apnea. - CPAP use every night.  Obesity. - Weight loss would be beneficial.  Orders Placed This Encounter  Procedures  . Expectorated sputum assessment w rflx to resp cult   Meds ordered this encounter  Medications  . guaiFENesin (MUCINEX) 600 MG 12 hr tablet    Sig: Take 1 tablet (600 mg total) by mouth 2 (two) times daily.    Dispense:  60 tablet    Refill:  1   Return in about 1 month (around 08/27/2018).   Date: 07/27/2018  MRN# [id number] Sara Pierce November 06, 1941  Referring Physician: Self referral.   Sara Pierce is a 77 y.o. old female seen in consultation for chief complaint of:    Chief Complaint  Patient presents with  . 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  . Emphysema    02:    HPI:   [name] is a 77 y/o female with a history of clotting disorder, DM, HTN, CKD, thyroid disease, COPD, depression, obstructive sleep apnea, previous tobacco use and chronic heart failure.  She has had multiple hospital admissions over the last few months for COPD exacerbation and cardiac issues.  She has a diagnosis of COPD, she has been coughing for several years, for about 4 years or more. She has seen other doctors but never had a  resolution to the problem.  She is not a smoker, last was about 30 years ago.  She has a dog and sleeps in bed. She has never been tested for allergies.  She has reflux, she does not take anything for it.  She believes she has been prescribed a flutter valve in the past, though she does not remember where it is and does not currently use it  She has a constant rattling cough for several years. She thinks that benadryl helps. Nebulizer helps. Steroids helps.  Tessalon does not help. She takes Anoro but does not seem to help, rescue inhaler seems to help. She is oxygen at 3L chronically.   CT chest 04/17/2018 ,chest x-ray 04/21/2018>> images personally reviewed, there is bibasilar atelectasis with pleural effusions, volume overload with cardiomegaly, pulmonary hypertension.  Left lower lobe atelectasis, small pleural effusion.  Echocardiogram 04/16/18>>EF is 55%  PMHX:   Past Medical History:  Diagnosis Date  . Arthritis   . Cardiomyopathy ([place])   . Cataract   . Cerebral hemorrhage ([place])   . CHF (congestive heart failure) ([place])   . Chronic kidney disease   . Clotting disorder ([place])   . COPD (chronic obstructive pulmonary disease) ([place])   . Depression   . Diabetes mellitus without complication ([place])   . Hypertension   . Mixed incontinence   . Obesity   . Sleep apnea   . Thyroid disease   . Urinary  frequency   . Vaginal atrophy   . Varicose veins    Surgical Hx:  Past Surgical History:  Procedure Laterality Date  . APPENDECTOMY    . Cardiac Catherization    . COLONOSCOPY WITH PROPOFOL N/A 04/28/2016   Procedure: COLONOSCOPY WITH PROPOFOL;  Surgeon: Manya Silvas, MD;  Location: [facility] ENDOSCOPY;  Service: Endoscopy;  Laterality: N/A;  . COLONOSCOPY WITH PROPOFOL N/A 04/29/2016   Procedure: COLONOSCOPY WITH PROPOFOL;  Surgeon: Manya Silvas, MD;  Location: [facility] ENDOSCOPY;  Service: Endoscopy;  Laterality: N/A;  . JOINT REPLACEMENT    . STRABISMUS SURGERY    . TONSILLECTOMY    .  TUBAL LIGATION     Family Hx:  Family History  Problem Relation Age of Onset  . Heart attack Father   . Coronary artery disease Father   . Hypertension Father   . Hyperlipidemia Father   . Heart attack Mother   . Hypertension Mother   . Hyperlipidemia Mother   . Breast cancer Neg Hx    Social Hx:   Social History   Tobacco Use  . Smoking status: Former Smoker    Packs/day: 0.50    Years: 44.00    Pack years: 22.00    Types: Cigarettes  . Smokeless tobacco: Never Used  Substance Use Topics  . Alcohol use: No  . Drug use: No   Medication:    Current Outpatient Medications:  .  acetaminophen (TYLENOL) 325 MG tablet, Take 2 tablets (650 mg total) by mouth every 6 (six) hours as needed for mild pain (or Fever >/= 101)., Disp: , Rfl:  .  albuterol (PROVENTIL) (2.5 MG/3ML) 0.083% nebulizer solution, Inhale 3 mLs into the lungs every 6 (six) hours as needed for wheezing or shortness of breath., Disp: 75 mL, Rfl: 12 .  ARIPiprazole (ABILIFY) 5 MG tablet, Take 5 mg by mouth at bedtime., Disp: , Rfl:  .  budesonide (PULMICORT) 0.5 MG/2ML nebulizer solution, Take 2 mLs (0.5 mg total) 2 (two) times daily by nebulization., Disp: , Rfl: 12 .  buPROPion (WELLBUTRIN XL) 150 MG 24 hr tablet, Take 150 mg by mouth daily., Disp: , Rfl:  .  clonazePAM (KLONOPIN) 1 MG tablet, Take 1 tablet (1 mg total) by mouth 2 (two) times daily., Disp: 30 tablet, Rfl: 0 .  clopidogrel (PLAVIX) 75 MG tablet, Take 1 tablet (75 mg total) by mouth daily., Disp: , Rfl:  .  dextromethorphan (DELSYM) 30 MG/5ML liquid, Take 5 mLs (30 mg total) by mouth 2 (two) times daily., Disp: 89 mL, Rfl: 0 .  docusate sodium (COLACE) 100 MG capsule, Take 1 capsule (100 mg total) by mouth 2 (two) times daily., Disp: 10 capsule, Rfl: 0 .  FLUoxetine (PROZAC) 40 MG capsule, Take 80 mg by mouth daily., Disp: , Rfl:  .  fluticasone (FLONASE) 50 MCG/ACT nasal spray, Place 2 sprays into both nostrils daily., Disp: , Rfl:  .  furosemide  (LASIX) 20 MG tablet, Take 1 tablet (20 mg total) by mouth 2 (two) times daily., Disp: 30 tablet, Rfl:  .  gabapentin (NEURONTIN) 100 MG capsule, Take 2 capsules (200 mg total) by mouth 3 (three) times daily., Disp: , Rfl:  .  guaiFENesin (MUCINEX) 600 MG 12 hr tablet, Take 1 tablet (600 mg total) by mouth 2 (two) times daily., Disp: , Rfl:  .  ipratropium-albuterol (DUONEB) 0.5-2.5 (3) MG/3ML SOLN, Take 3 mLs by nebulization 4 (four) times daily., Disp: , Rfl:  .  liothyronine (CYTOMEL) 25 MCG  tablet, Take 25 mcg by mouth daily. , Disp: , Rfl:  .  mirtazapine (REMERON) 15 MG tablet, Take 7.5 mg by mouth at bedtime., Disp: , Rfl:  .  montelukast (SINGULAIR) 10 MG tablet, Take 10 mg by mouth at bedtime. , Disp: , Rfl:  .  omeprazole (PRILOSEC) 20 MG capsule, Take 20 mg by mouth daily., Disp: , Rfl:  .  polyethylene glycol (MIRALAX / GLYCOLAX) packet, Take 17 g by mouth daily., Disp: , Rfl:  .  potassium chloride (K-DUR) 10 MEQ tablet, Take 10 mEq by mouth 2 (two) times daily. , Disp: , Rfl:  .  simvastatin (ZOCOR) 40 MG tablet, Take 40 mg by mouth at bedtime. , Disp: , Rfl:  .  tiZANidine (ZANAFLEX) 2 MG tablet, Take 1 tablet (2 mg total) by mouth every 6 (six) hours as needed for muscle spasms., Disp: 30 tablet, Rfl: 0 .  traMADol (ULTRAM) 50 MG tablet, Take 1 tablet (50 mg total) by mouth every 6 (six) hours as needed for moderate pain., Disp: 30 tablet, Rfl: 0 .  umeclidinium-vilanterol (ANORO ELLIPTA) 62.5-25 MCG/INH AEPB, Inhale 1 puff into the lungs daily., Disp: , Rfl:  .  Vitamin D, Ergocalciferol, (DRISDOL) 50000 units CAPS capsule, Take 50,000 Units by mouth every 30 (thirty) days., Disp: , Rfl:    Allergies:  Aspirin; Codeine; Naprosyn [naproxen]; Other; Tape; and Valacyclovir  Review of Systems: Gen:  Denies  fever, sweats, chills HEENT: Denies blurred vision, double vision. bleeds, sore throat Cvc:  No dizziness, chest pain. Resp:   Denies hemoptysis Gi: Denies swallowing  difficulty, stomach pain. Gu:  Denies bladder incontinence, burning urine Ext:   No Joint pain, stiffness. Skin: No skin rash,  hives  Endoc:  No polyuria, polydipsia. Psych: No depression, insomnia. Other:  All other systems were reviewed with the patient and were negative other that what is mentioned in the HPI.   Physical Examination:   VS: BP 106/60 (BP Location: Left Arm, Cuff Size: Large)   Pulse 76   Resp 16   Ht 5\' 5"  (1.651 m)   SpO2 93%   BMI 34.95 kg/m   General Appearance: No distress  Neuro:without focal findings,  speech normal,  HEENT: PERRLA, EOM intact.   Pulmonary: Diffuse bilateral wheezing and coarse breath sounds. CardiovascularNormal S1,S2.  No m/r/g.   Abdomen: Benign, Soft, non-tender. Renal:  No costovertebral tenderness  GU:  No performed at this time. Endoc: No evident thyromegaly, no signs of acromegaly. Skin:   warm, no rashes, no ecchymosis  Extremities: normal, no cyanosis, clubbing.  Other findings:    LABORATORY PANEL:   CBC No results for input(s): WBC, HGB, HCT, PLT in the last 168 hours. ------------------------------------------------------------------------------------------------------------------  Chemistries  No results for input(s): NA, K, CL, CO2, GLUCOSE, BUN, CREATININE, CALCIUM, MG, AST, ALT, ALKPHOS, BILITOT in the last 168 hours.  Invalid input(s): GFRCGP ------------------------------------------------------------------------------------------------------------------  Cardiac Enzymes No results for input(s): TROPONINI in the last 168 hours. ------------------------------------------------------------  RADIOLOGY:  No results found.     Thank  you for the consultation and for allowing [facility] to assist in the care of your patient. Our recommendations are noted above.  Please contact us if we can be of further service.   Marda Stalker, M.D., F.C.C.P.  Board Certified in Internal  Medicine, Pulmonary Medicine, [place], and Sleep Medicine.  [facility] Office Number: [phone]   07/27/2018

## 2018-07-27 NOTE — Patient Instructions (Addendum)
Start Allegra-D once daily. Mucinex 600 mg twice daily.  Do duonebs three times daily (at least) follow up with flutter valve immediately after.  Will Check sputum culture.

## 2018-08-24 ENCOUNTER — Ambulatory Visit: Payer: Medicare Other | Attending: Family Medicine | Admitting: Physical Therapy

## 2018-08-24 ENCOUNTER — Encounter: Payer: Self-pay | Admitting: Physical Therapy

## 2018-08-24 ENCOUNTER — Other Ambulatory Visit: Payer: Self-pay

## 2018-08-24 DIAGNOSIS — R293 Abnormal posture: Secondary | ICD-10-CM | POA: Diagnosis present

## 2018-08-24 DIAGNOSIS — R2681 Unsteadiness on feet: Secondary | ICD-10-CM | POA: Diagnosis present

## 2018-08-24 DIAGNOSIS — M6281 Muscle weakness (generalized): Secondary | ICD-10-CM

## 2018-08-24 NOTE — Therapy (Signed)
[place] MAIN [facility] SERVICES [address] [place], [place], [address] Phone: [phone]   Fax:  [phone]  Physical Therapy Evaluation  Patient Details  Name: HADAS JESSOP MRN: [id number] Date of Birth: [birth year] Referring Provider: Marygrace Drought MD   Encounter Date: 08/24/2018  PT End of Session - 08/24/18 1651    Visit Number  1    Number of Visits  17    Date for PT Re-Evaluation  10/19/18    Authorization Type  1/10 progress note    Authorization Time Period  eval on 08/24/18    PT Start Time  1532    PT Stop Time  1636    PT Time Calculation (min)  64 min    Equipment Utilized During Treatment  Gait belt    Activity Tolerance  Patient tolerated treatment well    Behavior During Therapy  [facility] for tasks assessed/performed       Past Medical History:  Diagnosis Date  . Arthritis   . Cardiomyopathy ([place])   . Cataract   . Cerebral hemorrhage ([place])   . CHF (congestive heart failure) ([place])   . Chronic kidney disease   . Clotting disorder ([place])   . COPD (chronic obstructive pulmonary disease) ([place])   . Depression   . Diabetes mellitus without complication ([place])   . Hypertension   . Mixed incontinence   . Obesity   . Sleep apnea   . Thyroid disease   . Urinary frequency   . Vaginal atrophy   . Varicose veins     Past Surgical History:  Procedure Laterality Date  . APPENDECTOMY    . Cardiac Catherization    . COLONOSCOPY WITH PROPOFOL N/A 04/28/2016   Procedure: COLONOSCOPY WITH PROPOFOL;  Surgeon: Manya Silvas, MD;  Location: [facility] ENDOSCOPY;  Service: Endoscopy;  Laterality: N/A;  . COLONOSCOPY WITH PROPOFOL N/A 04/29/2016   Procedure: COLONOSCOPY WITH PROPOFOL;  Surgeon: Manya Silvas, MD;  Location: [facility] ENDOSCOPY;  Service: Endoscopy;  Laterality: N/A;  . JOINT REPLACEMENT    . STRABISMUS SURGERY    . TONSILLECTOMY    . TUBAL LIGATION      There were no vitals filed for this visit.   Subjective  Assessment - 08/24/18 1555    Subjective  "I'd like to be able to get up and move around a little bit. I'd like to use a walker to walk around." Pt reports receiving a shot in L knee for L knee OA; shot was given about 6 months ago per patient report.     Patient is accompained by:  Family member   Husband and son   Pertinent History  Patient is a 77 yo female with history of L humeral fracture in 09/2017; status post ORIF in early 2019 and T6 compression fracture in 09/2017 secondary to a fall at home. Pt has chronic L UE and LE pain. Pt current has pain in lower back and L arm. Pt currently lives at home with husband and son. 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 Pt has been in a w/c last year. Pt denies any falls but transfers using hoyer lift at home and has very high fear of falling. Pt denies any history of CVA and current HTN and DM.    Limitations  Standing;Walking;House hold activities;Sitting    How long can you sit comfortably?  "My butt gets uncomfortable"    How long can you stand comfortably?  Unable  How long can you walk comfortably?  Unable     Patient Stated Goals  "To stand up and walk if I can"    Currently in Pain?  No/denies    Multiple Pain Sites  No         [facility] PT Assessment - 08/24/18 0001      Assessment   Medical Diagnosis  Left knee pain; s/p fall    Referring Provider  Marygrace Drought MD    Onset Date/Surgical Date  --   August 2018   Hand Dominance  Right    Next MD Visit  --   In the next 6 months   Prior Therapy  Home health PT, PT SNF      Precautions   Precautions  Fall      Restrictions   Weight Bearing Restrictions  No      Balance Screen   Has the patient fallen in the past 6 months  No    Has the patient had a decrease in activity level because of a fear of falling?   Yes    Is the patient reluctant to leave their home because of a fear of falling?   Yes      [place] residence    Living  Arrangements  Spouse/significant other;Children    Available Help at Discharge  Family    Type of [place]  One level    [place] - 4 wheels;Walker - 2 wheels;Cane - single point;Wheelchair - [employer];Hospital bed;Other (comment)   Hoyer lift     Prior Function   Level of Independence  Independent with community mobility with device;Independent with household mobility with device;Independent with basic ADLs;Independent with transfers    Vocation  Retired    Leisure  Watch TV, cooking      Cognition   Overall Cognitive Status  Within Functional Limits for tasks assessed      Observation/Other Assessments   Lower Extremity Functional Scale   2/80      Sensation   Light Touch  Appears Intact      Coordination   Fine Motor Movements are Fluid and Coordinated  No      AROM   Right Knee Extension  0   measured seated in w/c   Right Knee Flexion  91   measured seated in w/c   Left Knee Extension  15   measured seated in w/c   Left Knee Flexion  80   measured seated in w/c     Strength   Overall Strength  Deficits    Overall Strength Comments  Measured seated in w/c    Right Hip Flexion  3-/5    Right Hip ABduction  4-/5    Right Hip ADduction  4-/5    Left Hip Flexion  3-/5    Left Hip ABduction  4-/5    Left Hip ADduction  4-/5    Right Knee Flexion  4-/5    Right Knee Extension  4-/5    Left Knee Flexion  4-/5    Left Knee Extension  3+/5    Right Ankle Dorsiflexion  4-/5    Left Ankle Dorsiflexion  3+/5      Transfers   Comments  Slide board transfer max assist +2, dependent for partial Radio producer  Mobility  Yes    Environmental health practitioner  Other (comment)   Dependent   Wheelchair Parts Management  Needs assistance    Distance  0   Unable to self-propel     High Level Balance   High Level Balance Comments  Poor dynamic sitting balance, good static  sitting balance                Objective measurements completed on examination: See above findings.  Recommended patient continue with HEP for LE strengthening; will address HEP next visit;             PT Education - 08/24/18 1651    Education Details  plan of care, recommendations    Person(s) Educated  Patient;Spouse;Child(ren)    Methods  Explanation    Comprehension  Verbalized understanding       PT Short Term Goals - 08/24/18 1643      PT SHORT TERM GOAL #1   Title  Patient will be adherent to HEP at least 3x a week to improve functional strength and balance for better safety at home.    Time  4    Period  Weeks    Status  New    Target Date  09/21/18      PT SHORT TERM GOAL #2   Title  Patient will exhibits good dynamic sitting balance by improving functional reach test to >5 inches for increased forward weight shift to prepare for sit<>Stand transfers.     Baseline  patient unable to shift forward independently; exhibits poor dynamic sitting balance;     Time  4    Period  Weeks    Status  New    Target Date  09/21/18      PT SHORT TERM GOAL #3   Title  Patient will increase BLE gross strength to > 4/5 in BLE to faciltiate better strength for ADL ability;     Time  4    Period  Weeks    Status  New    Target Date  09/21/18        PT Long Term Goals - 08/24/18 1645      PT LONG TERM GOAL #1   Title  Patient will deny any falls over past 4 weeks to demonstrate improved safety awareness at home and work.     Time  8    Period  Weeks    Status  New    Target Date  10/19/18      PT LONG TERM GOAL #2   Title  Patient will be min A for sit<>stand transfer with LRAD to improve ADLs such as toileting.     Baseline  dependent for partial stand    Time  8    Period  Weeks    Status  New    Target Date  10/19/18      PT LONG TERM GOAL #3   Title  Patient will be able to stand with min A with LRAD for at least 2 min to improve ADL ability such  as dressing and bathing;     Baseline  unable    Time  8    Period  Weeks    Status  New    Target Date  10/19/18             Plan - 08/24/18 1648    Clinical Impression Statement  Patient is a 77 yo female with h/o fall in 07/2017 resulting in L  humeral fracture and T6 compression fracture. Pt spent time in SNF following fall as well as home health PT; worked primarily on transfers and learned how to do slide board transfers in SNF. Pt currently utilizes hoyer lift at home to transfer between hospital bed and wheelchair with husband and son assist. Pt has occasional L knee pain but received a shot 6 months ago and has no pain at this time. Pt is extremely fearful of falling even while sitting unsupported and required CGA when sitting edge of mat table for comfort. Pt demonstrates gross LE weakness as well. Pt is dependent in partial stand and max assist +2 in slide board transfers requiring VCs for proper positioning and technique. Pt would benefit from skilled PT intervention for improvements in strength, balance, and transfers.     History and Personal Factors relevant to plan of care:  (+) no recent falls, family support, motivated (-) high fear of falling, decreased mobility, comorbidities     Clinical Presentation  Evolving    Clinical Presentation due to:  high fear of falling, decreased mobility, co-morbidities which are progressive;     Clinical Decision Making  Moderate    Rehab Potential  Fair    Clinical Impairments Affecting Rehab Potential  positive: motivated, good family support, negative: chronic condition, low physical functioning;     PT Frequency  2x / week    PT Duration  8 weeks    PT Treatment/Interventions  ADLs/Self Care Home Management;Cryotherapy;Electrical Stimulation;Moist Heat;Therapeutic exercise;Therapeutic activities;Functional mobility training;Gait training;Balance training;Neuromuscular re-education;Patient/family education;Energy conservation;Passive range  of motion    PT Next Visit Plan  work on dynamic sitting balance, functional reach test    PT Home Exercise Plan  continue with HEP lower extremity exercise;     Consulted and Agree with Plan of Care  Patient;Family member/caregiver    Family Member Consulted  husband;        Patient will benefit from skilled therapeutic intervention in order to improve the following deficits and impairments:  Decreased endurance, Decreased activity tolerance, Decreased knowledge of use of DME, Decreased strength, Impaired UE functional use, Decreased balance, Decreased mobility, Difficulty walking, Decreased range of motion, Improper body mechanics, Decreased coordination, Decreased safety awareness, Postural dysfunction  Visit Diagnosis: Muscle weakness (generalized)  Unsteadiness on feet  Abnormal posture     Problem List Patient Active Problem List   Diagnosis Date Noted  . Perirectal abscess 06/02/2018  . Chronic diastolic heart failure ([place]) 05/12/2018  . NSTEMI (non-ST elevated myocardial infarction) ([place]) 04/15/2018  . COPD (chronic obstructive pulmonary disease) ([place]) 01/04/2018  . Pressure injury of skin 10/25/2017  . Absolute anemia 03/15/2015  . Anxiety 03/15/2015  . Arthritis 03/15/2015  . Cataract 03/15/2015  . Chronic constipation 03/15/2015  . Blood clotting disorder ([place]) 03/15/2015  . CAFL (chronic airflow limitation) ([place]) 03/15/2015  . Clinical depression 03/15/2015  . Diabetes ([place]) 03/15/2015  . H/O varicella 03/15/2015  . BP (high blood pressure) 03/15/2015  . Mixed incontinence 03/15/2015  . 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 Adiposity 03/15/2015  . Apnea, sleep 03/15/2015  . Disease of thyroid gland 03/15/2015  . FOM (frequency of micturition) 03/15/2015  . 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 Candida vaginitis 03/15/2015  . Abnormal ECG 03/15/2015   Kayla Holder SPT, This entire session was performed under direct  supervision and direction of a licensed therapist/therapist assistant . I have personally read, edited and approve of the note as written.   Trotter,Margaret  PT, DPT 08/24/2018, 4:57 PM  [place] MAIN [facility] SERVICES [address] [place], [place], [address] Phone: [phone]   Fax:  [phone]  Name: JERRIYAH LOUIS MRN: [id number] Date of Birth: 03/17/1941

## 2018-08-29 ENCOUNTER — Ambulatory Visit: Payer: Medicare Other

## 2018-08-29 DIAGNOSIS — M6281 Muscle weakness (generalized): Secondary | ICD-10-CM

## 2018-08-29 DIAGNOSIS — R2681 Unsteadiness on feet: Secondary | ICD-10-CM

## 2018-08-29 DIAGNOSIS — R293 Abnormal posture: Secondary | ICD-10-CM

## 2018-08-29 NOTE — Therapy (Addendum)
[place] MAIN [facility] SERVICES [address] [place], [place], [address] Phone: [phone]   Fax:  [phone]  Physical Therapy Treatment  Patient Details  Name: Sara Pierce MRN: [id number] Date of Birth: [birth year] Referring Provider: Marygrace Drought MD   Encounter Date: 08/29/2018  PT End of Session - 08/29/18 1543    Visit Number  2    Number of Visits  17    Date for PT Re-Evaluation  10/19/18    Authorization Type  2/10 progress note    Authorization Time Period  eval on 08/24/18    PT Start Time  1430    PT Stop Time  1515    PT Time Calculation (min)  45 min    Equipment Utilized During Treatment  Gait belt    Activity Tolerance  Patient tolerated treatment well    Behavior During Therapy  [facility] for tasks assessed/performed       Past Medical History:  Diagnosis Date  . Arthritis   . Cardiomyopathy ([place])   . Cataract   . Cerebral hemorrhage ([place])   . CHF (congestive heart failure) ([place])   . Chronic kidney disease   . Clotting disorder ([place])   . COPD (chronic obstructive pulmonary disease) ([place])   . Depression   . Diabetes mellitus without complication ([place])   . Hypertension   . Mixed incontinence   . Obesity   . Sleep apnea   . Thyroid disease   . Urinary frequency   . Vaginal atrophy   . Varicose veins     Past Surgical History:  Procedure Laterality Date  . APPENDECTOMY    . Cardiac Catherization    . COLONOSCOPY WITH PROPOFOL N/A 04/28/2016   Procedure: COLONOSCOPY WITH PROPOFOL;  Surgeon: Manya Silvas, MD;  [facility] ENDOSCOPY;  Service: Endoscopy;  Laterality: N/A;  . COLONOSCOPY WITH PROPOFOL N/A 04/29/2016   Procedure: COLONOSCOPY WITH PROPOFOL;  Surgeon: Manya Silvas, MD;  Location: [facility] ENDOSCOPY;  Service: Endoscopy;  Laterality: N/A;  . JOINT REPLACEMENT    . STRABISMUS SURGERY    . TONSILLECTOMY    . TUBAL LIGATION      There were no vitals filed for this visit.  Subjective  Assessment - 08/29/18 1542    Subjective  Patient reports she is doing good today; no complaints of pain and no new falls since last visit.     Patient is accompained by:  Family member   Husband and son   Pertinent History  Patient is a 77 yo female with history of L humeral fracture in 09/2017; status post ORIF in early 2019 and T6 compression fracture in 09/2017 secondary to a fall at home. Pt has chronic L UE and LE pain. Pt current has pain in lower back and L arm. Pt currently lives at home with husband and son. 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 Pt has been in a w/c last year. Pt denies any falls but transfers using hoyer lift at home and has very high fear of falling. Pt denies any history of CVA and current HTN and DM.    Limitations  Standing;Walking;House hold activities;Sitting    How long can you sit comfortably?  "My butt gets uncomfortable"    How long can you stand comfortably?  Unable     How long can you walk comfortably?  Unable     Patient Stated Goals  "To stand up and walk if I can"  Currently in Pain?  No/denies       Treatment Transfer w/c > mat table max assist +2 with slideboard; VCs for hand placement and proper sequencing  Dynamic sitting balance: -Cone reaching with RUE out to R side and then across body to L side x7 cones x2 rounds -Cone reaching with RUE above head and down to place cone on 6 inch step x7 cones x2 rounds -Backwards shoulder circles x10 reps -Push/pull with PT with 1.5# dowel x15 reps each direction with PT providing resistance when pulling back to body -Overhead reaches with R arm x10 reps -Straight forward reaches with L arm x10 reps -Posterior lean and then coming back upright to sitting x10 reps, rest, x10 reps going back a little farther to therapist hand Required demonstration and VCs for proper technique and sequencing as well as performance; CGA required for patient safety and comfort when reaching outside BOS especially when  reaching forwards  Transfer mat table > w/c max assist +2 with slideboard; VCs for hand placement and proper technique for transfer              PT Education - 08/29/18 1543    Education Details  sitting balance, transfers    Person(s) Educated  Patient    Methods  Explanation;Demonstration;Verbal cues    Comprehension  Verbalized understanding;Returned demonstration;Verbal cues required;Need further instruction       PT Short Term Goals - 08/24/18 1643      PT SHORT TERM GOAL #1   Title  Patient will be adherent to HEP at least 3x a week to improve functional strength and balance for better safety at home.    Time  4    Period  Weeks    Status  New    Target Date  09/21/18      PT SHORT TERM GOAL #2   Title  Patient will exhibits good dynamic sitting balance by improving functional reach test to >5 inches for increased forward weight shift to prepare for sit<>Stand transfers.     Baseline  patient unable to shift forward independently; exhibits poor dynamic sitting balance;     Time  4    Period  Weeks    Status  New    Target Date  09/21/18      PT SHORT TERM GOAL #3   Title  Patient will increase BLE gross strength to > 4/5 in BLE to faciltiate better strength for ADL ability;     Time  4    Period  Weeks    Status  New    Target Date  09/21/18        PT Long Term Goals - 08/24/18 1645      PT LONG TERM GOAL #1   Title  Patient will deny any falls over past 4 weeks to demonstrate improved safety awareness at home and work.     Time  8    Period  Weeks    Status  New    Target Date  10/19/18      PT LONG TERM GOAL #2   Title  Patient will be min A for sit<>stand transfer with LRAD to improve ADLs such as toileting.     Baseline  dependent for partial stand    Time  8    Period  Weeks    Status  New    Target Date  10/19/18      PT LONG TERM GOAL #3   Title  Patient  will be able to stand with min A with LRAD for at least 2 min to improve ADL  ability such as dressing and bathing;     Baseline  unable    Time  8    Period  Weeks    Status  New    Target Date  10/19/18            Plan - 08/29/18 1544    Clinical Impression Statement  Patient tolerated therapy session well. Pt required max assist +2 for slide board transfer from wheelchair to mat table with VCs for leaning forwards and correct technique for transfer. Pt presents with ankle PF contractures L > R and is unable to place feet flat on floor today due to this. Pt performed seated dynamic balance challenges with reaching as well as weight shifting activities; required CGA for pt comfort and safety and VCs for proper technique and form for exercises. Pt did not have any discomfort during session but did report some back pain she attributed to fatigue at the end of session. Pt requires verbal encouragement and CGA for participation in activities when reaching outside BOS. Pt would benefit from continued skilled PT intervention for improvements in strength, balance, and transfers.     Rehab Potential  Fair    Clinical Impairments Affecting Rehab Potential  positive: motivated, good family support, negative: chronic condition, low physical functioning;     PT Frequency  2x / week    PT Duration  8 weeks    PT Treatment/Interventions  ADLs/Self Care Home Management;Cryotherapy;Electrical Stimulation;Moist Heat;Therapeutic exercise;Therapeutic activities;Functional mobility training;Gait training;Balance training;Neuromuscular re-education;Patient/family education;Energy conservation;Passive range of motion    PT Next Visit Plan  work on dynamic sitting balance, functional reach test    PT Home Exercise Plan  continue with HEP lower extremity exercise;     Consulted and Agree with Plan of Care  Patient;Family member/caregiver    Family Member Consulted  husband;        Patient will benefit from skilled therapeutic intervention in order to improve the following deficits and  impairments:  Decreased endurance, Decreased activity tolerance, Decreased knowledge of use of DME, Decreased strength, Impaired UE functional use, Decreased balance, Decreased mobility, Difficulty walking, Decreased range of motion, Improper body mechanics, Decreased coordination, Decreased safety awareness, Postural dysfunction  Visit Diagnosis: Muscle weakness (generalized)  Unsteadiness on feet  Abnormal posture     Problem List Patient Active Problem List   Diagnosis Date Noted  . Perirectal abscess 06/02/2018  . Chronic diastolic heart failure ([place]) 05/12/2018  . NSTEMI (non-ST elevated myocardial infarction) ([place]) 04/15/2018  . COPD (chronic obstructive pulmonary disease) ([place]) 01/04/2018  . Pressure injury of skin 10/25/2017  . Absolute anemia 03/15/2015  . Anxiety 03/15/2015  . Arthritis 03/15/2015  . Cataract 03/15/2015  . Chronic constipation 03/15/2015  . Blood clotting disorder ([place]) 03/15/2015  . CAFL (chronic airflow limitation) ([place]) 03/15/2015  . Clinical depression 03/15/2015  . Diabetes ([place]) 03/15/2015  . H/O varicella 03/15/2015  . BP (high blood pressure) 03/15/2015  . Mixed incontinence 03/15/2015  . 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 Adiposity 03/15/2015  . Apnea, sleep 03/15/2015  . Disease of thyroid gland 03/15/2015  . FOM (frequency of micturition) 03/15/2015  . 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 Candida vaginitis 03/15/2015  . Abnormal ECG 03/15/2015   Harriet Masson, SPT Lyndel Safe Huprich PT, DPT, GCS  Huprich,Jason 08/29/2018, 5:11 PM  [place]  MAIN [facility] SERVICES [place], [place], [address] Phone: [phone]   Fax:  [phone]  Name: Sara Pierce MRN: [id number] Date of Birth: Feb 08, 1941

## 2018-08-30 ENCOUNTER — Encounter: Payer: Self-pay | Admitting: Internal Medicine

## 2018-08-30 ENCOUNTER — Ambulatory Visit (INDEPENDENT_AMBULATORY_CARE_PROVIDER_SITE_OTHER): Payer: Medicare Other | Admitting: Internal Medicine

## 2018-08-30 VITALS — BP 104/60 | HR 100 | Ht 65.0 in

## 2018-08-30 DIAGNOSIS — J471 Bronchiectasis with (acute) exacerbation: Secondary | ICD-10-CM | POA: Diagnosis not present

## 2018-08-30 MED ORDER — AZITHROMYCIN 250 MG PO TABS
250.0000 mg | ORAL_TABLET | Freq: Every day | ORAL | 0 refills | Status: DC
Start: 2018-08-30 — End: 2020-01-22

## 2018-08-30 NOTE — Patient Instructions (Signed)
Will check sputum Will start azithromycin 5 days per week, Monday-Friday.

## 2018-08-30 NOTE — Progress Notes (Signed)
[facility]    Assessment and Plan:  Copd with bronchiectasis.  Intractable cough. -Needs aggressive airway clearance, continue duo nebs 3 times daily followed by flutter valve. - Patient was not able to do sputum culture, will order once again. - We will start on Mucinex 600 mg 3 times daily, she is currently taking an unknown known amount once daily, asked to call us back and tell us how much she is taking. -We will check sputum culture.   Chronic excess mucus production. - Suspect this is related to chronic bronchiectasis. - We will start azithromycin 250 mg 5 days weekly.  If not improved at next visit, will consider initiation of percussion vest. - Given her comorbidities and very poor functional status, bronchoscopy would likely be high risk.  Obstructive sleep apnea. - Not on cpap. - Continue oxygen at night and during the day.  Obesity. - Discussed importance of weight loss.   Meds ordered this encounter  Medications  . azithromycin (ZITHROMAX) 250 MG tablet    Sig: Take 1 tablet (250 mg total) by mouth daily. Take 1 tablet 5 days per week (Monday thru Friday).    Dispense:  21 tablet    Refill:  0   Return in about 8 weeks (around 10/25/2018).   Date: 08/30/2018  MRN# [id number] Sara Pierce 09/24/1941  Referring Physician: Self referral.   Sara Pierce is a 77 y.o. old female seen in consultation for chief complaint of:    Chief Complaint  Patient presents with  . COPD    pt still has sob and cough with mucus production    HPI:   Sara Pierce is a 77 y/o female with a history of clotting disorder, DM, HTN, CKD, thyroid disease, COPD, depression, obstructive sleep apnea, previous tobacco use and chronic heart failure.  She has had multiple hospital admissions over the last few months for COPD exacerbation and cardiac issues. At last visit she was noted to have a diagnosis of COPD with copious excess mucus production.  She was started on  duo nebs 3 times daily followed by flutter valve, asked to use Mucinex 600 mg 3 times daily as well as Allegra-D.  She was sent for sputum culture but was not done.  Since her last visit she is using nebs tid, followed by flutter valve. She is using mucinex, unknown dose once daily. She thinks that she is taking allegra. She continues to have excess mucus production.  The dog still sleeps in bed.  She remains on 3L oxygen.   CT chest 04/17/2018 ,chest x-ray 04/21/2018>> images personally reviewed, there is bibasilar atelectasis with pleural effusions, volume overload with cardiomegaly, pulmonary hypertension.  Left lower lobe atelectasis, small pleural effusion.  Echocardiogram 04/16/18>>EF is 55%  Medication:    Current Outpatient Medications:  .  acetaminophen (TYLENOL) 325 MG tablet, Take 2 tablets (650 mg total) by mouth every 6 (six) hours as needed for mild pain (or Fever >/= 101)., Disp: , Rfl:  .  ARIPiprazole (ABILIFY) 5 MG tablet, Take 5 mg by mouth at bedtime., Disp: , Rfl:  .  budesonide (PULMICORT) 0.5 MG/2ML nebulizer solution, Take 2 mLs (0.5 mg total) 2 (two) times daily by nebulization., Disp: , Rfl: 12 .  buPROPion (WELLBUTRIN XL) 150 MG 24 hr tablet, Take 150 mg by mouth daily., Disp: , Rfl:  .  clonazePAM (KLONOPIN) 1 MG tablet, Take 1 tablet (1 mg total) by mouth 2 (two) times daily., Disp: 30 tablet, Rfl:  0 .  clopidogrel (PLAVIX) 75 MG tablet, Take 1 tablet (75 mg total) by mouth daily., Disp: , Rfl:  .  cyclobenzaprine (FLEXERIL) 10 MG tablet, Take 10 mg by mouth 3 (three) times daily as needed for muscle spasms., Disp: , Rfl:  .  dextromethorphan (DELSYM) 30 MG/5ML liquid, Take 5 mLs (30 mg total) by mouth 2 (two) times daily. (Patient not taking: Reported on 08/24/2018), Disp: 89 mL, Rfl: 0 .  FLUoxetine (PROZAC) 40 MG capsule, Take 80 mg by mouth daily., Disp: , Rfl:  .  fluticasone (FLONASE) 50 MCG/ACT nasal spray, Place 2 sprays into both nostrils daily., Disp: , Rfl:  .   furosemide (LASIX) 20 MG tablet, Take 1 tablet (20 mg total) by mouth 2 (two) times daily., Disp: 30 tablet, Rfl:  .  gabapentin (NEURONTIN) 100 MG capsule, Take 2 capsules (200 mg total) by mouth 3 (three) times daily., Disp: , Rfl:  .  guaiFENesin (MUCINEX) 600 MG 12 hr tablet, Take 1 tablet (600 mg total) by mouth 2 (two) times daily., Disp: , Rfl:  .  guaiFENesin (MUCINEX) 600 MG 12 hr tablet, Take 1 tablet (600 mg total) by mouth 2 (two) times daily. (Patient not taking: Reported on 08/24/2018), Disp: 60 tablet, Rfl: 1 .  hydrOXYzine (ATARAX/VISTARIL) 25 MG tablet, Take 25 mg by mouth 3 (three) times daily as needed., Disp: , Rfl:  .  ipratropium-albuterol (DUONEB) 0.5-2.5 (3) MG/3ML SOLN, Take 3 mLs by nebulization 4 (four) times daily. Dx:J44.9, Disp: 360 mL, Rfl: 11 .  magnesium oxide (MAG-OX) 400 MG tablet, Take 400 mg by mouth daily., Disp: , Rfl:  .  mirtazapine (REMERON) 15 MG tablet, Take 7.5 mg by mouth at bedtime., Disp: , Rfl:  .  montelukast (SINGULAIR) 10 MG tablet, Take 10 mg by mouth at bedtime. , Disp: , Rfl:  .  omeprazole (PRILOSEC) 20 MG capsule, Take 20 mg by mouth daily., Disp: , Rfl:  .  polyethylene glycol (MIRALAX / GLYCOLAX) packet, Take 17 g by mouth daily., Disp: , Rfl:  .  potassium chloride (K-DUR) 10 MEQ tablet, Take 10 mEq by mouth 2 (two) times daily. , Disp: , Rfl:  .  primidone (MYSOLINE) 250 MG tablet, Take 500 mg by mouth at bedtime., Disp: , Rfl:  .  simvastatin (ZOCOR) 40 MG tablet, Take 40 mg by mouth at bedtime. , Disp: , Rfl:  .  umeclidinium-vilanterol (ANORO ELLIPTA) 62.5-25 MCG/INH AEPB, Inhale 1 puff into the lungs daily., Disp: , Rfl:    Allergies:  Aspirin; Codeine; Naprosyn [naproxen]; Other; Tape; Valacyclovir; and Latex  Review of Systems:  Constitutional: Feels well. Cardiovascular: No chest pain.  Pulmonary: Denies dyspnea.   The remainder of systems were reviewed and were found to be negative other than what is documented in the HPI.      Physical Examination:   VS: BP 104/60 (BP Location: Left Arm, Cuff Size: Large)   Pulse 100   Ht 5\' 5"  (1.651 m)   SpO2 92%   BMI 34.95 kg/m   General Appearance: No distress  Neuro:without focal findings, mental status, speech normal, alert and oriented HEENT: PERRLA, EOM intact Pulmonary: No wheezing, No rales scattered bilateral rhonchi. CardiovascularNormal S1,S2.  No m/r/g.  Abdomen: Benign, Soft, non-tender, No masses Renal:  No costovertebral tenderness  GU:  No performed at this time. Endoc: No evident thyromegaly, no signs of acromegaly or Cushing features Skin:   warm, no rashes, no ecchymosis  Extremities: normal, no cyanosis, clubbing.  LABORATORY PANEL:   CBC No results for input(s): WBC, HGB, HCT, PLT in the last 168 hours. ------------------------------------------------------------------------------------------------------------------  Chemistries  No results for input(s): NA, K, CL, CO2, GLUCOSE, BUN, CREATININE, CALCIUM, MG, AST, ALT, ALKPHOS, BILITOT in the last 168 hours.  Invalid input(s): GFRCGP ------------------------------------------------------------------------------------------------------------------  Cardiac Enzymes No results for input(s): TROPONINI in the last 168 hours. ------------------------------------------------------------  RADIOLOGY:  No results found.     Thank  you for the consultation and for allowing [facility] to assist in the care of your patient. Our recommendations are noted above.  Please contact us if we can be of further service.   Marda Stalker, M.D., F.C.C.P.  Board Certified in Internal Medicine, Pulmonary Medicine, [place], and Sleep Medicine.  [facility] Office Number: [phone]   08/30/2018

## 2018-08-31 ENCOUNTER — Encounter: Payer: Self-pay | Admitting: Physical Therapy

## 2018-08-31 ENCOUNTER — Other Ambulatory Visit
Admission: RE | Admit: 2018-08-31 | Discharge: 2018-08-31 | Disposition: A | Discharge status: Home / Self Care | Payer: Medicare Other | Source: Ambulatory Visit | Attending: Internal Medicine | Admitting: Internal Medicine

## 2018-08-31 ENCOUNTER — Ambulatory Visit: Payer: Medicare Other | Admitting: Physical Therapy

## 2018-08-31 DIAGNOSIS — R2681 Unsteadiness on feet: Secondary | ICD-10-CM

## 2018-08-31 DIAGNOSIS — J479 Bronchiectasis, uncomplicated: Secondary | ICD-10-CM | POA: Diagnosis present

## 2018-08-31 DIAGNOSIS — R293 Abnormal posture: Secondary | ICD-10-CM

## 2018-08-31 DIAGNOSIS — M6281 Muscle weakness (generalized): Secondary | ICD-10-CM

## 2018-08-31 LAB — EXPECTORATED SPUTUM ASSESSMENT W REFEX TO RESP CULTURE

## 2018-08-31 LAB — EXPECTORATED SPUTUM ASSESSMENT W GRAM STAIN, RFLX TO RESP C

## 2018-08-31 NOTE — Patient Instructions (Addendum)
CAREGIVER ASSISTED: Hamstrings - Supine    Caregiver holds leg at ankle and over knee; raises leg straight. Hold 30 seconds. 3 reps per set each leg, 3 sets per day each leg, 7 days per week.   Copyright  VHI. All rights reserved.   CAREGIVER ASSISTED: Hip Flexors - Side-Lying    Caregiver pulls leg backward, using other hand to push hips gently forward. Hold 30 seconds. 3 reps per set on each leg, 3 sets per day on each leg, 7 days per week. Place moving hand under knee to keep leg parallel to floor during backward pull.  Copyright  VHI. All rights reserved.   Dorsiflexion: Stretch - Heel Cord / Gastrocnemius    Position (A) Patient: Lie or sit with knee straight. Helper: Cup heel. Make sure grip is firm. Motion (B) - Helper uses forearm to apply pressure to entire sole of foot, stretching foot toward shin. CAUTION: Stretch should be felt in calf. Do not allow foot to twist. Hold 30 seconds. Repeat 3 times. Repeat with other leg. Do 3 sessions per day.   Copyright  VHI. All rights reserved.

## 2018-08-31 NOTE — Therapy (Addendum)
[place] MAIN [facility] SERVICES [address] [place], [place], [address] Phone: [phone]   Fax:  [phone]  Physical Therapy Treatment  Patient Details  Name: Sara Pierce MRN: [id number] Date of Birth: 01-07-1941 Referring Provider: Marygrace Drought MD   Encounter Date: 08/31/2018  PT End of Session - 08/31/18 1256    Visit Number  3    Number of Visits  17    Date for PT Re-Evaluation  10/19/18    Authorization Type  3/10 progress note    Authorization Time Period  eval on 08/24/18    PT Start Time  1300    PT Stop Time  1345    PT Time Calculation (min)  45 min    Equipment Utilized During Treatment  Gait belt    Activity Tolerance  Patient tolerated treatment well    Behavior During Therapy  [facility] for tasks assessed/performed       Past Medical History:  Diagnosis Date  . Arthritis   . Cardiomyopathy ([place])   . Cataract   . Cerebral hemorrhage ([place])   . CHF (congestive heart failure) ([place])   . Chronic kidney disease   . Clotting disorder ([place])   . COPD (chronic obstructive pulmonary disease) ([place])   . Depression   . Diabetes mellitus without complication ([place])   . Hypertension   . Mixed incontinence   . Obesity   . Sleep apnea   . Thyroid disease   . Urinary frequency   . Vaginal atrophy   . Varicose veins     Past Surgical History:  Procedure Laterality Date  . APPENDECTOMY    . Cardiac Catherization    . COLONOSCOPY WITH PROPOFOL N/A 04/28/2016   Procedure: COLONOSCOPY WITH PROPOFOL;  Surgeon: Manya Silvas, MD;  Location: [facility] ENDOSCOPY;  Service: Endoscopy;  Laterality: N/A;  . COLONOSCOPY WITH PROPOFOL N/A 04/29/2016   Procedure: COLONOSCOPY WITH PROPOFOL;  Surgeon: Manya Silvas, MD;  Location: [facility] ENDOSCOPY;  Service: Endoscopy;  Laterality: N/A;  . JOINT REPLACEMENT    . STRABISMUS SURGERY    . TONSILLECTOMY    . TUBAL LIGATION      There were no vitals filed for this visit.  Subjective  Assessment - 08/31/18 1342    Subjective  Patient reports she is doing well today; does complain of soreness in B legs following last session.     Patient is accompained by:  Family member   Husband and son   Pertinent History  Patient is a 77 yo female with history of L humeral fracture in 09/2017; status post ORIF in early 2019 and T6 compression fracture in 09/2017 secondary to a fall at home. Pt has chronic L UE and LE pain. Pt current has pain in lower back and L arm. Pt currently lives at home with husband and son. 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 Pt has been in a w/c last year. Pt denies any falls but transfers using hoyer lift at home and has very high fear of falling. Pt denies any history of CVA and current HTN and DM.    Limitations  Standing;Walking;House hold activities;Sitting    How long can you sit comfortably?  "My butt gets uncomfortable"    How long can you stand comfortably?  Unable     How long can you walk comfortably?  Unable     Patient Stated Goals  "To stand up and walk if I can"    Currently  in Pain?  No/denies    Multiple Pain Sites  No          Treatment Transfer w/c > mat table max assist +2 with slideboard; VCs for hand placement and proper sequencing   Passive stretches, performed by PT due to pt weakness and decreased mobility; provided education to pt's husband to perform stretches at home: -Hamstring stretch x30 sec holds x2 reps each leg  -Calf stretches x30 sec holds x1 rep each leg -Sidelying hip flexor stretch x30 sec holds x1 rep on RLE, x2 reps on LLE   Dynamic sitting balance: -Backwards shoulder circles x10 reps -Push/pull with PT with 1.5# dowel x15 reps each direction with PT providing resistance when pulling back to body Required demonstration and VCs for proper technique and sequencing as well as performance;   Transfer mat table > w/c max assist +2 with slideboard; VCs for hand placement and proper technique for  transfer  Seated hamstring stretch in w/c x1 rep x20 sec hold on each leg; VCs for proper technique and sequencing               PT Education - 08/31/18 1256    Education Details  sitting balance, transfers, stretches    Person(s) Educated  Patient    Methods  Explanation;Demonstration;Verbal cues    Comprehension  Verbalized understanding;Returned demonstration;Verbal cues required;Need further instruction       PT Short Term Goals - 08/24/18 1643      PT SHORT TERM GOAL #1   Title  Patient will be adherent to HEP at least 3x a week to improve functional strength and balance for better safety at home.    Time  4    Period  Weeks    Status  New    Target Date  09/21/18      PT SHORT TERM GOAL #2   Title  Patient will exhibits good dynamic sitting balance by improving functional reach test to >5 inches for increased forward weight shift to prepare for sit<>Stand transfers.     Baseline  patient unable to shift forward independently; exhibits poor dynamic sitting balance;     Time  4    Period  Weeks    Status  New    Target Date  09/21/18      PT SHORT TERM GOAL #3   Title  Patient will increase BLE gross strength to > 4/5 in BLE to faciltiate better strength for ADL ability;     Time  4    Period  Weeks    Status  New    Target Date  09/21/18        PT Long Term Goals - 08/24/18 1645      PT LONG TERM GOAL #1   Title  Patient will deny any falls over past 4 weeks to demonstrate improved safety awareness at home and work.     Time  8    Period  Weeks    Status  New    Target Date  10/19/18      PT LONG TERM GOAL #2   Title  Patient will be min A for sit<>stand transfer with LRAD to improve ADLs such as toileting.     Baseline  dependent for partial stand    Time  8    Period  Weeks    Status  New    Target Date  10/19/18      PT LONG TERM GOAL #3   Title  Patient will be able to stand with min A with LRAD for at least 2 min to improve ADL ability  such as dressing and bathing;     Baseline  unable    Time  8    Period  Weeks    Status  New    Target Date  10/19/18            Plan - 08/31/18 1408    Clinical Impression Statement  Patient required max assist +2 for slide board transfer from wheelchair to mat table with VCs for leaning forwards and correct sequencing and technique for transfer. PT performed passive hamstring stretch and calf/DF stretch in supine; instructed pt's husband in stretches to perform at home with proper technique and positioning for stretching. Pt required max assist +2 for rolling with limited UE support; required mod assist +1 to maintain sidelying position. PT performed sidelying hip flexion stretch and provided instruction to pt's husband for stretch at home. Pt performed dynamic sitting balance exercises including posterior shoulder rolls and push/pull with PT holding 1# dowel; required VCs for proper technique. PT enouraged pt to perform these exercises when sitting at home. Pt verbalized feeling soreness in legs following session as well. PT educated pt in seated hamstring stretch with heel on stool to perform at home when sitting in w/c. Pt will continue to benefit from skilled PT intervention for improvements in strength, balance, and transfers.     Rehab Potential  Fair    Clinical Impairments Affecting Rehab Potential  positive: motivated, good family support, negative: chronic condition, low physical functioning;     PT Frequency  2x / week    PT Duration  8 weeks    PT Treatment/Interventions  ADLs/Self Care Home Management;Cryotherapy;Electrical Stimulation;Moist Heat;Therapeutic exercise;Therapeutic activities;Functional mobility training;Gait training;Balance training;Neuromuscular re-education;Patient/family education;Energy conservation;Passive range of motion    PT Next Visit Plan  work on dynamic sitting balance, functional reach test    PT Home Exercise Plan  continue with HEP lower extremity  exercise;     Consulted and Agree with Plan of Care  Patient;Family member/caregiver    Family Member Consulted  husband;        Patient will benefit from skilled therapeutic intervention in order to improve the following deficits and impairments:  Decreased endurance, Decreased activity tolerance, Decreased knowledge of use of DME, Decreased strength, Impaired UE functional use, Decreased balance, Decreased mobility, Difficulty walking, Decreased range of motion, Improper body mechanics, Decreased coordination, Decreased safety awareness, Postural dysfunction  Visit Diagnosis: Muscle weakness (generalized)  Unsteadiness on feet  Abnormal posture     Problem List Patient Active Problem List   Diagnosis Date Noted  . Perirectal abscess 06/02/2018  . Chronic diastolic heart failure ([place]) 05/12/2018  . NSTEMI (non-ST elevated myocardial infarction) ([place]) 04/15/2018  . COPD (chronic obstructive pulmonary disease) ([place]) 01/04/2018  . Pressure injury of skin 10/25/2017  . Absolute anemia 03/15/2015  . Anxiety 03/15/2015  . Arthritis 03/15/2015  . Cataract 03/15/2015  . Chronic constipation 03/15/2015  . Blood clotting disorder ([place]) 03/15/2015  . CAFL (chronic airflow limitation) ([place]) 03/15/2015  . Clinical depression 03/15/2015  . Diabetes ([place]) 03/15/2015  . H/O varicella 03/15/2015  . BP (high blood pressure) 03/15/2015  . Mixed incontinence 03/15/2015  . 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 Adiposity 03/15/2015  . Apnea, sleep 03/15/2015  . Disease of thyroid gland 03/15/2015  . FOM (frequency of micturition) 03/15/2015  . 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  [place] vaginitis 03/15/2015  . Abnormal ECG 03/15/2015   Harriet Masson, SPT This entire session was performed under direct supervision and direction of a licensed therapist/therapist assistant . I have personally read, edited and approve of the note as  written.  Trotter,Margaret PT, DPT 08/31/2018, 2:53 PM  [place] MAIN [facility] SERVICES [address] [place], [place], [address] Phone: [phone]   Fax:  [phone]  Name: GWYNNETH FABIO MRN: [id number] Date of Birth: Sep 27, 1941

## 2018-09-03 LAB — CULTURE, RESPIRATORY W GRAM STAIN: Culture: NORMAL

## 2018-09-05 ENCOUNTER — Ambulatory Visit: Payer: Medicare Other | Admitting: Physical Therapy

## 2018-09-05 ENCOUNTER — Encounter: Payer: Self-pay | Admitting: Physical Therapy

## 2018-09-05 DIAGNOSIS — M6281 Muscle weakness (generalized): Secondary | ICD-10-CM | POA: Diagnosis not present

## 2018-09-05 DIAGNOSIS — R293 Abnormal posture: Secondary | ICD-10-CM

## 2018-09-05 DIAGNOSIS — R2681 Unsteadiness on feet: Secondary | ICD-10-CM

## 2018-09-05 NOTE — Patient Instructions (Addendum)
Access Code: [facility]  URL: [email or web address]  Date: 09/05/2018  Prepared by: Blanche East   Exercises  Hip Flexor Stretch at [facility] of Bed - 5 reps - 1 sets - 30 hold - 3x daily - 7x weekly  Seated Calf Stretch with Strap - 5 reps - 1 sets - 30 hold - 3x daily - 7x weekly  Seated Shoulder Flexion Towel Slide at Table Top - 15 reps - 3 sets - 3x daily - 7x weekly

## 2018-09-05 NOTE — Therapy (Addendum)
[place] MAIN [facility] SERVICES [address] [place], [place], [address] Phone: [phone]   Fax:  [phone]  Physical Therapy Treatment  Patient Details  Name: Sara Pierce MRN: [id number] Date of Birth: 1941-03-09 Referring Provider: Marygrace Drought MD   Encounter Date: 09/05/2018  PT End of Session - 09/05/18 1120    Visit Number  4    Number of Visits  17    Date for PT Re-Evaluation  10/19/18    Authorization Type  4/10 progress note    Authorization Time Period  eval on 08/24/18    PT Start Time  1130    PT Stop Time  1215    PT Time Calculation (min)  45 min    Equipment Utilized During Treatment  Gait belt    Activity Tolerance  Patient tolerated treatment well    Behavior During Therapy  [facility] for tasks assessed/performed       Past Medical History:  Diagnosis Date  . Arthritis   . Cardiomyopathy ([place])   . Cataract   . Cerebral hemorrhage ([place])   . CHF (congestive heart failure) ([place])   . Chronic kidney disease   . Clotting disorder ([place])   . COPD (chronic obstructive pulmonary disease) ([place])   . Depression   . Diabetes mellitus without complication ([place])   . Hypertension   . Mixed incontinence   . Obesity   . Sleep apnea   . Thyroid disease   . Urinary frequency   . Vaginal atrophy   . Varicose veins     Past Surgical History:  Procedure Laterality Date  . APPENDECTOMY    . Cardiac Catherization    . COLONOSCOPY WITH PROPOFOL N/A 04/28/2016   Procedure: COLONOSCOPY WITH PROPOFOL;  Surgeon: Manya Silvas, MD;  Location: [facility] ENDOSCOPY;  Service: Endoscopy;  Laterality: N/A;  . COLONOSCOPY WITH PROPOFOL N/A 04/29/2016   Procedure: COLONOSCOPY WITH PROPOFOL;  Surgeon: Manya Silvas, MD;  Location: [facility] ENDOSCOPY;  Service: Endoscopy;  Laterality: N/A;  . JOINT REPLACEMENT    . STRABISMUS SURGERY    . TONSILLECTOMY    . TUBAL LIGATION      There were no vitals filed for this visit.  Subjective  Assessment - 09/05/18 1154    Subjective  Patient reports she has only done stretching 3 times since last visit; pt reports she has to have help to perform stretches, and pt's husband reports she did not always want to do the stretches when he tried. Pt reports she is able to do her own make up and comb her hair but does not do any other ADLs. Pt does not express any verbalization of pain today.     Patient is accompained by:  Family member   Husband and son   Pertinent History  Patient is a 77 yo female with history of L humeral fracture in 09/2017; status post ORIF in early 2019 and T6 compression fracture in 09/2017 secondary to a fall at home. Pt has chronic L UE and LE pain. Pt current has pain in lower back and L arm. Pt currently lives at home with husband and son. 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 Pt has been in a w/c last year. Pt denies any falls but transfers using hoyer lift at home and has very high fear of falling. Pt denies any history of CVA and current HTN and DM.    Limitations  Standing;Walking;House hold activities;Sitting    How long  can you sit comfortably?  "My butt gets uncomfortable"    How long can you stand comfortably?  Unable     How long can you walk comfortably?  Unable     Patient Stated Goals  "To stand up and walk if I can"    Currently in Pain?  No/denies         [facility] PT Assessment - 09/05/18 0001      PROM   Left Hip Extension  -8 deg   R hip ext -10 deg   Right Knee Extension  -10    Left Knee Extension  -14    Right Ankle Dorsiflexion  -37    Left Ankle Dorsiflexion  -52     Patient exhibits significant ankle PF contractures with limited ankle DF. She has been given calf stretches and exhibits the potential and motivation to perform stretches and make improvements. However due to weakness and limited weight bearing ability, she would benefit from night splints to improve ankle DF ROM. Patient is currently unable to stand due to joint flexion  contractures. This significantly affects her ADLs. In addition, due to limited ankle DF, she is unable to sit comfortably in her manual wheelchair; Night splints will significantly improve her ROM and therefore also improve her functional mobility;   Treatment Transfer w/c > mat table max assist +2 with slideboard; VCs for hand placement and proper sequencing   Seated calf stretch with sheet, leg resting on stool, pt unable to perform with stool due to unable to prevent ER at hip and knee with stool top spinning and rolling wheels   Measured joint flexion contractures, see above;   Stretch with hanging leg off side of table, RLE only x20 sec hold with VCs for positioning to get stretch across hip flexor and front of hip   Provided education on importance of getting out of bed and spending time in w/c; importance of alternating positions throughout the day rather than just staying in the bed or w/c for majority of the day without moving around; importance of stretching legs out straight in bed for improved mobility in legs   Dynamic sitting balance: -Backwards shoulder circles x10 reps -Push/pull with PT with PVC pipe x15 reps each direction with PT providing resistance when pulling back to body -Scapular retractions with red tband, PT providing anchor point, x10 reps, VCs for keeping elbows bent and moving straight back -Lifting PVC pipe up overhead with BUE x10 reps Required demonstration and VCs for proper technique and sequencing as well as performance;  Transfer mat table > w/c max assist +2 with slideboard; VCs for hand placement and proper technique for transfer                    PT Education - 09/05/18 1119    Education Details  sitting balance, transfers, stretches/HEP    Person(s) Educated  Patient    Methods  Explanation;Demonstration;Verbal cues    Comprehension  Verbalized understanding;Returned demonstration;Verbal cues required;Need further instruction        PT Short Term Goals - 08/24/18 1643      PT SHORT TERM GOAL #1   Title  Patient will be adherent to HEP at least 3x a week to improve functional strength and balance for better safety at home.    Time  4    Period  Weeks    Status  New    Target Date  09/21/18      PT SHORT TERM  GOAL #2   Title  Patient will exhibits good dynamic sitting balance by improving functional reach test to >5 inches for increased forward weight shift to prepare for sit<>Stand transfers.     Baseline  patient unable to shift forward independently; exhibits poor dynamic sitting balance;     Time  4    Period  Weeks    Status  New    Target Date  09/21/18      PT SHORT TERM GOAL #3   Title  Patient will increase BLE gross strength to > 4/5 in BLE to faciltiate better strength for ADL ability;     Time  4    Period  Weeks    Status  New    Target Date  09/21/18        PT Long Term Goals - 08/24/18 1645      PT LONG TERM GOAL #1   Title  Patient will deny any falls over past 4 weeks to demonstrate improved safety awareness at home and work.     Time  8    Period  Weeks    Status  New    Target Date  10/19/18      PT LONG TERM GOAL #2   Title  Patient will be min A for sit<>stand transfer with LRAD to improve ADLs such as toileting.     Baseline  dependent for partial stand    Time  8    Period  Weeks    Status  New    Target Date  10/19/18      PT LONG TERM GOAL #3   Title  Patient will be able to stand with min A with LRAD for at least 2 min to improve ADL ability such as dressing and bathing;     Baseline  unable    Time  8    Period  Weeks    Status  New    Target Date  10/19/18            Plan - 09/05/18 1232    Clinical Impression Statement  Patient required max assist +2 for slide board transfer to and from the mat table; pt unable to lift leg to get slide board positioned under hip. PT provided education on importance of changing positioning throughout the day and  stretching both in bed as well as in w/c. Pt performed dynamic balance challenges in sitting with shoulder circles, push/pull with PT, scapular retractions, and lifting PVC pipe overhead; required VCs for proper technique as well as continued engagement in activities. PT emphasized importance of stretching at least 5 times before next appointment for better mobility to process to standing. Pt will continue to benefit from skilled PT intervention for improvements in balance, strength, and transfers.      Rehab Potential  Fair    Clinical Impairments Affecting Rehab Potential  positive: motivated, good family support, negative: chronic condition, low physical functioning;     PT Frequency  2x / week    PT Duration  8 weeks    PT Treatment/Interventions  ADLs/Self Care Home Management;Cryotherapy;Electrical Stimulation;Moist Heat;Therapeutic exercise;Therapeutic activities;Functional mobility training;Gait training;Balance training;Neuromuscular re-education;Patient/family education;Energy conservation;Passive range of motion    PT Next Visit Plan  work on dynamic sitting balance, functional reach test    PT Home Exercise Plan  continue with HEP lower extremity exercise;     Consulted and Agree with Plan of Care  Patient;Family member/caregiver    Family Member Consulted  husband;        Patient will benefit from skilled therapeutic intervention in order to improve the following deficits and impairments:  Decreased endurance, Decreased activity tolerance, Decreased knowledge of use of DME, Decreased strength, Impaired UE functional use, Decreased balance, Decreased mobility, Difficulty walking, Decreased range of motion, Improper body mechanics, Decreased coordination, Decreased safety awareness, Postural dysfunction  Visit Diagnosis: Muscle weakness (generalized)  Unsteadiness on feet  Abnormal posture     Problem List Patient Active Problem List   Diagnosis Date Noted  . Perirectal abscess  06/02/2018  . Chronic diastolic heart failure ([place]) 05/12/2018  . NSTEMI (non-ST elevated myocardial infarction) ([place]) 04/15/2018  . COPD (chronic obstructive pulmonary disease) ([place]) 01/04/2018  . Pressure injury of skin 10/25/2017  . Absolute anemia 03/15/2015  . Anxiety 03/15/2015  . Arthritis 03/15/2015  . Cataract 03/15/2015  . Chronic constipation 03/15/2015  . Blood clotting disorder ([place]) 03/15/2015  . CAFL (chronic airflow limitation) ([place]) 03/15/2015  . Clinical depression 03/15/2015  . Diabetes ([place]) 03/15/2015  . H/O varicella 03/15/2015  . BP (high blood pressure) 03/15/2015  . Mixed incontinence 03/15/2015  . 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 Adiposity 03/15/2015  . Apnea, sleep 03/15/2015  . Disease of thyroid gland 03/15/2015  . FOM (frequency of micturition) 03/15/2015  . 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 Candida vaginitis 03/15/2015  . Abnormal ECG 03/15/2015   Harriet Masson, SPT This entire session was performed under direct supervision and direction of a licensed therapist/therapist assistant . I have personally read, edited and approve of the note as written.  Trotter,Margaret PT, DPT 09/05/2018, 1:15 PM  [place] MAIN [facility] SERVICES [address] [place], [place], [address] Phone: [phone]   Fax:  [phone]  Name: Sara Pierce MRN: [id number] Date of Birth: 15-Oct-1941

## 2018-09-07 ENCOUNTER — Encounter: Payer: Self-pay | Admitting: Physical Therapy

## 2018-09-07 ENCOUNTER — Ambulatory Visit: Payer: Medicare Other | Admitting: Physical Therapy

## 2018-09-07 DIAGNOSIS — M6281 Muscle weakness (generalized): Secondary | ICD-10-CM

## 2018-09-07 DIAGNOSIS — R293 Abnormal posture: Secondary | ICD-10-CM

## 2018-09-07 DIAGNOSIS — R2681 Unsteadiness on feet: Secondary | ICD-10-CM

## 2018-09-07 NOTE — Therapy (Addendum)
[place] MAIN [facility] SERVICES [address] [place], [place], [address] Phone: [phone]   Fax:  [phone]  Physical Therapy Treatment  Patient Details  Name: Sara Pierce MRN: [id number] Date of Birth: 05/11/1941 Referring Provider (PT): Marygrace Drought MD   Encounter Date: 09/07/2018  PT End of Session - 09/07/18 1205    Visit Number  5    Number of Visits  17    Date for PT Re-Evaluation  10/19/18    Authorization Type  5/10 progress note    Authorization Time Period  eval on 08/24/18    PT Start Time  1145    PT Stop Time  1230    PT Time Calculation (min)  45 min    Equipment Utilized During Treatment  Gait belt    Activity Tolerance  Patient tolerated treatment well    Behavior During Therapy  WFL for tasks assessed/performed       Past Medical History:  Diagnosis Date  . Arthritis   . Cardiomyopathy ([place])   . Cataract   . Cerebral hemorrhage ([place])   . CHF (congestive heart failure) ([place])   . Chronic kidney disease   . Clotting disorder ([place])   . COPD (chronic obstructive pulmonary disease) ([place])   . Depression   . Diabetes mellitus without complication ([place])   . Hypertension   . Mixed incontinence   . Obesity   . Sleep apnea   . Thyroid disease   . Urinary frequency   . Vaginal atrophy   . Varicose veins     Past Surgical History:  Procedure Laterality Date  . APPENDECTOMY    . Cardiac Catherization    . COLONOSCOPY WITH PROPOFOL N/A 04/28/2016   Procedure: COLONOSCOPY WITH PROPOFOL;  Surgeon: Manya Silvas, MD;  [facility] ENDOSCOPY;  Service: Endoscopy;  Laterality: N/A;  . COLONOSCOPY WITH PROPOFOL N/A 04/29/2016   Procedure: COLONOSCOPY WITH PROPOFOL;  Surgeon: Manya Silvas, MD;  Location: [facility] ENDOSCOPY;  Service: Endoscopy;  Laterality: N/A;  . JOINT REPLACEMENT    . STRABISMUS SURGERY    . TONSILLECTOMY    . TUBAL LIGATION      There were no vitals filed for this visit.  Subjective  Assessment - 09/07/18 1203    Subjective  Patient reports she did her stretches 3 times since last visit rather than the 5 recommended by PT. Pt reports husband sometimes hurts when stretching; educated pt on soreness that is likely to occur with beginning stretches.     Patient is accompained by:  Family member   Husband and son   Pertinent History  Patient is a 78 yo female with history of L humeral fracture in 09/2017; status post ORIF in early 2019 and T6 compression fracture in 09/2017 secondary to a fall at home. Pt has chronic L UE and LE pain. Pt current has pain in lower back and L arm. Pt currently lives at home with husband and son. 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 Pt has been in a w/c last year. Pt denies any falls but transfers using hoyer lift at home and has very high fear of falling. Pt denies any history of CVA and current HTN and DM.    Limitations  Standing;Walking;House hold activities;Sitting    How long can you sit comfortably?  "My butt gets uncomfortable"    How long can you stand comfortably?  Unable     How long can you walk comfortably?  Unable     Patient Stated Goals  "To stand up and walk if I can"    Currently in Pain?  No/denies    Multiple Pain Sites  No         [facility] PT Assessment - 09/07/18 0001      PROM   Right Ankle Dorsiflexion  -42      Treatment Transfer w/c > mat table max assist +2 with slideboard; VCs for hand placement and proper sequencing  Attempted ankle mobilization and stretching but unable to perform due to significant joint hypomobility with no motion occurring at joints; unable to get L ankle into position for proper mobilization   Exercises  Seated Pillow Squeezes - 15 reps - 2 sets Seated Knee Extension with red theraband resistance - 15 reps - 2 sets Seated March with red theraband resistance - 15 reps - 2 sets Seated Hip Abduction with red theraband resistance - 15 reps - 2 sets Seated Scapular Retraction with red  theraband resistance - 15 reps - 2 sets  Pt required VCs and tactile cuing for proper form, technique, and sequencing; VCs for engagement in exercises and education on importance of performing exercises and stretches regularly at home    Transfer mat table > w/c max assist +2 with slideboard; VCs for hand placement and proper technique for transfer                   PT Education - 09/07/18 1204    Education Details  transfers, stretches/HEP    Person(s) Educated  Patient    Methods  Explanation;Demonstration;Verbal cues    Comprehension  Verbalized understanding;Need further instruction       PT Short Term Goals - 08/24/18 1643      PT SHORT TERM GOAL #1   Title  Patient will be adherent to HEP at least 3x a week to improve functional strength and balance for better safety at home.    Time  4    Period  Weeks    Status  New    Target Date  09/21/18      PT SHORT TERM GOAL #2   Title  Patient will exhibits good dynamic sitting balance by improving functional reach test to >5 inches for increased forward weight shift to prepare for sit<>Stand transfers.     Baseline  patient unable to shift forward independently; exhibits poor dynamic sitting balance;     Time  4    Period  Weeks    Status  New    Target Date  09/21/18      PT SHORT TERM GOAL #3   Title  Patient will increase BLE gross strength to > 4/5 in BLE to faciltiate better strength for ADL ability;     Time  4    Period  Weeks    Status  New    Target Date  09/21/18        PT Long Term Goals - 08/24/18 1645      PT LONG TERM GOAL #1   Title  Patient will deny any falls over past 4 weeks to demonstrate improved safety awareness at home and work.     Time  8    Period  Weeks    Status  New    Target Date  10/19/18      PT LONG TERM GOAL #2   Title  Patient will be min A for sit<>stand transfer with LRAD to improve ADLs such as  toileting.     Baseline  dependent for partial stand    Time  8     Period  Weeks    Status  New    Target Date  10/19/18      PT LONG TERM GOAL #3   Title  Patient will be able to stand with min A with LRAD for at least 2 min to improve ADL ability such as dressing and bathing;     Baseline  unable    Time  8    Period  Weeks    Status  New    Target Date  10/19/18            Plan - 09/07/18 1233    Clinical Impression Statement  Patient continues to require max assist +2 for slide board transfer to and from the mat table; pt unable to lift leg to get board under hip as well as UE control to push board under hip. PT attempted ankle mobilization but was unable to complete mobilization on L ankle due to not being able to get ankle in correct position for mobilization. Pt has slightly more motion in R ankle but unable to get into proper position for most effective joint mobilizations. Pt performed LE strengthening exercises in sitting; VCs for proper technique and sequencing as well as increasing engagement in exercises. PT emphasized importance of stretching at home as well as doing LE strengthening as prescribed for exercise. Pt will continue to benefit from skilled PT intervention for improvements in sitting balance, strength, and transfers.     Rehab Potential  Fair    Clinical Impairments Affecting Rehab Potential  positive: motivated, good family support, negative: chronic condition, low physical functioning;     PT Frequency  2x / week    PT Duration  8 weeks    PT Treatment/Interventions  ADLs/Self Care Home Management;Cryotherapy;Electrical Stimulation;Moist Heat;Therapeutic exercise;Therapeutic activities;Functional mobility training;Gait training;Balance training;Neuromuscular re-education;Patient/family education;Energy conservation;Passive range of motion    PT Next Visit Plan  work on dynamic sitting balance, functional reach test    PT Home Exercise Plan  continue with HEP lower extremity exercise;     Consulted and Agree with Plan of Care   Patient;Family member/caregiver    Family Member Consulted  husband;        Patient will benefit from skilled therapeutic intervention in order to improve the following deficits and impairments:  Decreased endurance, Decreased activity tolerance, Decreased knowledge of use of DME, Decreased strength, Impaired UE functional use, Decreased balance, Decreased mobility, Difficulty walking, Decreased range of motion, Improper body mechanics, Decreased coordination, Decreased safety awareness, Postural dysfunction  Visit Diagnosis: Muscle weakness (generalized)  Unsteadiness on feet  Abnormal posture     Problem List Patient Active Problem List   Diagnosis Date Noted  . Perirectal abscess 06/02/2018  . Chronic diastolic heart failure ([place]) 05/12/2018  . NSTEMI (non-ST elevated myocardial infarction) ([place]) 04/15/2018  . COPD (chronic obstructive pulmonary disease) ([place]) 01/04/2018  . Pressure injury of skin 10/25/2017  . Absolute anemia 03/15/2015  . Anxiety 03/15/2015  . Arthritis 03/15/2015  . Cataract 03/15/2015  . Chronic constipation 03/15/2015  . Blood clotting disorder ([place]) 03/15/2015  . CAFL (chronic airflow limitation) ([place]) 03/15/2015  . Clinical depression 03/15/2015  . Diabetes ([place]) 03/15/2015  . H/O varicella 03/15/2015  . BP (high blood pressure) 03/15/2015  . Mixed incontinence 03/15/2015  . 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 Adiposity 03/15/2015  . Apnea, sleep 03/15/2015  .  Disease of thyroid gland 03/15/2015  . FOM (frequency of micturition) 03/15/2015  . 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 Candida vaginitis 03/15/2015  . Abnormal ECG 03/15/2015   Harriet Masson, SPT This entire session was performed under direct supervision and direction of a licensed therapist/therapist assistant . I have personally read, edited and approve of the note as written.  Trotter,Margaret PT, DPT 09/07/2018, 3:59 PM  [place] MAIN [facility] SERVICES [address] [place], [place], [address] Phone: [phone]   Fax:  [phone]  Name: Sara Pierce MRN: [id number] Date of Birth: 11/11/1941

## 2018-09-07 NOTE — Patient Instructions (Signed)
Access Code: [email or web address]  URL: [email or web address]  Date: 09/07/2018  Prepared by: Blanche East   Exercises  Seated Pillow Squeezes - 15 reps - 2 sets - 2x daily - 7x weekly  Seated Knee Extension with Resistance - 15 reps - 2 sets - 2x daily - 7x weekly  Seated March with Resistance - 15 reps - 2 sets - 2x daily - 7x weekly  Seated Hip Abduction with Resistance - 15 reps - 2 sets - 2x daily - 7x weekly  Seated Scapular Retraction with Resistance - 15 reps - 2 sets - 2x daily - 7x weekly

## 2018-09-12 ENCOUNTER — Encounter: Payer: Self-pay | Admitting: Physical Therapy

## 2018-09-12 ENCOUNTER — Ambulatory Visit: Payer: Medicare Other | Attending: Family Medicine | Admitting: Physical Therapy

## 2018-09-12 DIAGNOSIS — M6281 Muscle weakness (generalized): Secondary | ICD-10-CM | POA: Diagnosis present

## 2018-09-12 DIAGNOSIS — R293 Abnormal posture: Secondary | ICD-10-CM | POA: Insufficient documentation

## 2018-09-12 DIAGNOSIS — R2681 Unsteadiness on feet: Secondary | ICD-10-CM | POA: Insufficient documentation

## 2018-09-12 NOTE — Therapy (Addendum)
[place] [facility] SERVICES [address] [place], [place], [address] Phone: [phone]   Fax:  [phone]  Physical Therapy Treatment  Patient Details  Name: Sara Pierce MRN: [id number] Date of Birth: December 09, 1941 Referring Provider (PT): Marygrace Drought MD   Encounter Date: 09/12/2018  PT End of Session - 09/12/18 1159    Visit Number  6    Number of Visits  17    Date for PT Re-Evaluation  10/19/18    Authorization Type  6/10 progress note    Authorization Time Period  eval on 08/24/18    PT Start Time  1142    PT Stop Time  1225    PT Time Calculation (min)  43 min    Equipment Utilized During Treatment  Gait belt    Activity Tolerance  Patient tolerated treatment well    Behavior During Therapy  WFL for tasks assessed/performed       Past Medical History:  Diagnosis Date  . Arthritis   . Cardiomyopathy ([place])   . Cataract   . Cerebral hemorrhage ([place])   . CHF (congestive heart failure) ([place])   . Chronic kidney disease   . Clotting disorder ([place])   . COPD (chronic obstructive pulmonary disease) ([place])   . Depression   . Diabetes mellitus without complication ([place])   . Hypertension   . Mixed incontinence   . Obesity   . Sleep apnea   . Thyroid disease   . Urinary frequency   . Vaginal atrophy   . Varicose veins     Past Surgical History:  Procedure Laterality Date  . APPENDECTOMY    . Cardiac Catherization    . COLONOSCOPY WITH PROPOFOL N/A 04/28/2016   Procedure: COLONOSCOPY WITH PROPOFOL;  Surgeon: Manya Silvas, MD;  Location: [facility] ENDOSCOPY;  Service: Endoscopy;  Laterality: N/A;  . COLONOSCOPY WITH PROPOFOL N/A 04/29/2016   Procedure: COLONOSCOPY WITH PROPOFOL;  Surgeon: Manya Silvas, MD;  Location: [facility] ENDOSCOPY;  Service: Endoscopy;  Laterality: N/A;  . JOINT REPLACEMENT    . STRABISMUS SURGERY    . TONSILLECTOMY    . TUBAL LIGATION      There were no vitals filed for this visit.  Subjective  Assessment - 09/12/18 1231    Subjective  Patient states she is doing well; has been doing her stretches at least 3 times a day at home.     Patient is accompained by:  Family member   Husband and son   Pertinent History  Patient is a 77 yo female with history of L humeral fracture in 09/2017; status post ORIF in early 2019 and T6 compression fracture in 09/2017 secondary to a fall at home. Pt has chronic L UE and LE pain. Pt current has pain in lower back and L arm. Pt currently lives at home with husband and son. 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 Pt has been in a w/c last year. Pt denies any falls but transfers using hoyer lift at home and has very high fear of falling. Pt denies any history of CVA and current HTN and DM.    Limitations  Standing;Walking;House hold activities;Sitting    How long can you sit comfortably?  "My butt gets uncomfortable"    How long can you stand comfortably?  Unable     How long can you walk comfortably?  Unable     Patient Stated Goals  "To stand up and walk if I can"  Currently in Pain?  No/denies       Treatment  Transfer w/c > mat table max assist +2 with slideboard; VCs for hand placement and proper sequencing; pt attempted to get slide board into position mod I but unable to pull with R arm effectively and requires max A to lift leg as well as shift weight to the opposite side   L hand stretch with husband applying pressure to MCP joints x5 min; educated to perform 5-10 min without rest between to try to stretch out MCP joints, provided tactile cuing to husband for appropriate amount of pressure   Reaching exercise with R hand to take cones with R hand on R side and place on the L side x5 cones, reverse with taking cone from L side and placing on R side x5 cones; VCs for increasing forward trunk lean and engaging core to maintain balance; performed 2 bouts total while husband applied pressure to L MCP joints for stretch   Holding 3# bar, perform  seated alternating marches x10 reps, VCs and visual cues to bring knee up to touch bar for full ROM and increased strengthening  Bicep curls with 3# weighted bar x15 reps bilaterally, VCs for proper technique and moving all the way through the ROM  [place] reaching with R hand to move 4 balls up top 3 rungs, CGA for safety with VCs for sequencing and technique as well as foot placement; pt attempted to move balls from bottom rung to second from bottom; pt verbalized stretch in low back when reaching to bottom rung and on second attempt, pt verbalized intense pain in back with reach  PT provided education on importance of moving and stretching to increase mobility in the trunk and spine; educated pt on fear avoidance behavior and moving when not in pain despite fear      Transfer mat table > w/c max assist +2 with slideboard; VCs for hand placement and proper technique for transfer                     PT Education - 09/12/18 1158    Education Details  transfers, stretches/HEP, sitting balance    Person(s) Educated  Patient    Methods  Explanation;Demonstration;Verbal cues    Comprehension  Verbalized understanding;Need further instruction       PT Short Term Goals - 08/24/18 1643      PT SHORT TERM GOAL #1   Title  Patient will be adherent to HEP at least 3x a week to improve functional strength and balance for better safety at home.    Time  4    Period  Weeks    Status  New    Target Date  09/21/18      PT SHORT TERM GOAL #2   Title  Patient will exhibits good dynamic sitting balance by improving functional reach test to >5 inches for increased forward weight shift to prepare for sit<>Stand transfers.     Baseline  patient unable to shift forward independently; exhibits poor dynamic sitting balance;     Time  4    Period  Weeks    Status  New    Target Date  09/21/18      PT SHORT TERM GOAL #3   Title  Patient will increase BLE gross strength to > 4/5 in BLE to  faciltiate better strength for ADL ability;     Time  4    Period  Weeks  Status  New    Target Date  09/21/18        PT Long Term Goals - 08/24/18 1645      PT LONG TERM GOAL #1   Title  Patient will deny any falls over past 4 weeks to demonstrate improved safety awareness at home and work.     Time  8    Period  Weeks    Status  New    Target Date  10/19/18      PT LONG TERM GOAL #2   Title  Patient will be min A for sit<>stand transfer with LRAD to improve ADLs such as toileting.     Baseline  dependent for partial stand    Time  8    Period  Weeks    Status  New    Target Date  10/19/18      PT LONG TERM GOAL #3   Title  Patient will be able to stand with min A with LRAD for at least 2 min to improve ADL ability such as dressing and bathing;     Baseline  unable    Time  8    Period  Weeks    Status  New    Target Date  10/19/18            Plan - 09/12/18 1233    Clinical Impression Statement  Patient continues to require max assist +2 for slide baord transfers; demonstrated improved ability to get slide board into position at end of session to transfer back to w/c. PT provided education on stretching L MCP joints with pt and pt's husband; tactile and VCs for proper amount of pressure to avoid pain. Pt performed dynamic postural control exercises with reaching and weight shifting exercises; required VCs for proper technique and sequencing. Pt will continue to benefit from skilled PT intervention for improvements in sitting balance, strength, and transfers.     Rehab Potential  Fair    Clinical Impairments Affecting Rehab Potential  positive: motivated, good family support, negative: chronic condition, low physical functioning;     PT Frequency  2x / week    PT Duration  8 weeks    PT Treatment/Interventions  ADLs/Self Care Home Management;Cryotherapy;Electrical Stimulation;Moist Heat;Therapeutic exercise;Therapeutic activities;Functional mobility training;Gait  training;Balance training;Neuromuscular re-education;Patient/family education;Energy conservation;Passive range of motion    PT Next Visit Plan  work on dynamic sitting balance, functional reach test    PT Home Exercise Plan  continue with HEP lower extremity exercise;     Consulted and Agree with Plan of Care  Patient;Family member/caregiver    Family Member Consulted  husband;        Patient will benefit from skilled therapeutic intervention in order to improve the following deficits and impairments:  Decreased endurance, Decreased activity tolerance, Decreased knowledge of use of DME, Decreased strength, Impaired UE functional use, Decreased balance, Decreased mobility, Difficulty walking, Decreased range of motion, Improper body mechanics, Decreased coordination, Decreased safety awareness, Postural dysfunction  Visit Diagnosis: Muscle weakness (generalized)  Unsteadiness on feet  Abnormal posture     Problem List Patient Active Problem List   Diagnosis Date Noted  . Perirectal abscess 06/02/2018  . Chronic diastolic heart failure ([place]) 05/12/2018  . NSTEMI (non-ST elevated myocardial infarction) ([place]) 04/15/2018  . COPD (chronic obstructive pulmonary disease) ([place]) 01/04/2018  . Pressure injury of skin 10/25/2017  . Absolute anemia 03/15/2015  . Anxiety 03/15/2015  . Arthritis 03/15/2015  . Cataract 03/15/2015  . Chronic constipation  03/15/2015  . Blood clotting disorder ([place]) 03/15/2015  . CAFL (chronic airflow limitation) ([place]) 03/15/2015  . Clinical depression 03/15/2015  . Diabetes ([place]) 03/15/2015  . H/O varicella 03/15/2015  . BP (high blood pressure) 03/15/2015  . Mixed incontinence 03/15/2015  . 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 Adiposity 03/15/2015  . Apnea, sleep 03/15/2015  . Disease of thyroid gland 03/15/2015  . FOM (frequency of micturition) 03/15/2015  . 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 Candida vaginitis  03/15/2015  . Abnormal ECG 03/15/2015   Harriet Masson, SPT This entire session was performed under direct supervision and direction of a licensed therapist/therapist assistant . I have personally read, edited and approve of the note as written.  Trotter,Margaret PT, DPT 09/12/2018, 1:03 PM  [place] MAIN [facility] SERVICES [address] [place], [place], [address] Phone: [phone]   Fax:  [phone]  Name: Sara Pierce MRN: [id number] Date of Birth: 05-Jul-1941

## 2018-09-12 NOTE — Patient Instructions (Signed)
Advised patient to get a folder and put all papers with exercises in it for reference.  Left Hand Stretch: -Place hand on surface, like edge of bed or hard chair. Husband will apply mild pressure to joint for 5-10 minutes without any rest breaks to encourage increased flexibility in left hand. Perform at least 2 times each day.

## 2018-09-14 ENCOUNTER — Ambulatory Visit: Payer: Medicare Other | Admitting: Physical Therapy

## 2018-09-16 ENCOUNTER — Encounter: Payer: Self-pay | Admitting: Emergency Medicine

## 2018-09-16 ENCOUNTER — Other Ambulatory Visit: Payer: Self-pay

## 2018-09-16 ENCOUNTER — Emergency Department: Payer: Medicare Other

## 2018-09-16 ENCOUNTER — Emergency Department
Admission: EM | Admit: 2018-09-16 | Discharge: 2018-09-19 | Disposition: A | Discharge status: Skilled Nursing Facility | Payer: Medicare Other | Attending: Emergency Medicine | Admitting: Emergency Medicine

## 2018-09-16 DIAGNOSIS — Z7902 Long term (current) use of antithrombotics/antiplatelets: Secondary | ICD-10-CM | POA: Diagnosis not present

## 2018-09-16 DIAGNOSIS — R0602 Shortness of breath: Secondary | ICD-10-CM | POA: Diagnosis present

## 2018-09-16 DIAGNOSIS — Z79899 Other long term (current) drug therapy: Secondary | ICD-10-CM | POA: Diagnosis not present

## 2018-09-16 DIAGNOSIS — I5032 Chronic diastolic (congestive) heart failure: Secondary | ICD-10-CM | POA: Diagnosis not present

## 2018-09-16 DIAGNOSIS — N189 Chronic kidney disease, unspecified: Secondary | ICD-10-CM | POA: Insufficient documentation

## 2018-09-16 DIAGNOSIS — J441 Chronic obstructive pulmonary disease with (acute) exacerbation: Secondary | ICD-10-CM | POA: Diagnosis not present

## 2018-09-16 DIAGNOSIS — I130 Hypertensive heart and chronic kidney disease with heart failure and stage 1 through stage 4 chronic kidney disease, or unspecified chronic kidney disease: Secondary | ICD-10-CM | POA: Insufficient documentation

## 2018-09-16 DIAGNOSIS — E1122 Type 2 diabetes mellitus with diabetic chronic kidney disease: Secondary | ICD-10-CM | POA: Diagnosis not present

## 2018-09-16 DIAGNOSIS — Z87891 Personal history of nicotine dependence: Secondary | ICD-10-CM | POA: Diagnosis not present

## 2018-09-16 LAB — CBC
HEMATOCRIT: 34.9 % — AB (ref 35.0–47.0)
HEMOGLOBIN: 11.5 g/dL — AB (ref 12.0–16.0)
MCH: 31.5 pg (ref 26.0–34.0)
MCHC: 33 g/dL (ref 32.0–36.0)
MCV: 95.6 fL (ref 80.0–100.0)
Platelets: 240 10*3/uL (ref 150–440)
RBC: 3.65 MIL/uL — AB (ref 3.80–5.20)
RDW: 17.5 % — AB (ref 11.5–14.5)
WBC: 8.7 10*3/uL (ref 3.6–11.0)

## 2018-09-16 LAB — BASIC METABOLIC PANEL
Anion gap: 6 (ref 5–15)
BUN: 16 mg/dL (ref 8–23)
CALCIUM: 9.3 mg/dL (ref 8.9–10.3)
CHLORIDE: 105 mmol/L (ref 98–111)
CO2: 30 mmol/L (ref 22–32)
CREATININE: 0.7 mg/dL (ref 0.44–1.00)
GFR calc Af Amer: >60 mL/min (ref >60)
GFR calc non Af Amer: >60 mL/min (ref >60)
Glucose, Bld: 107 mg/dL — ABNORMAL HIGH (ref 70–99)
Potassium: 4.3 mmol/L (ref 3.5–5.1)
SODIUM: 141 mmol/L (ref 135–145)

## 2018-09-16 LAB — TROPONIN I: Troponin I: <0.03 ng/mL (ref <0.03)

## 2018-09-16 LAB — BRAIN NATRIURETIC PEPTIDE: B NATRIURETIC PEPTIDE 5: 86 pg/mL (ref 0.0–100.0)

## 2018-09-16 MED ORDER — GABAPENTIN 300 MG PO CAPS
300.0000 mg | ORAL_CAPSULE | Freq: Three times a day (TID) | ORAL | Status: DC
  Administered 2018-09-16 – 2018-09-19 (×9): 300 mg via ORAL
  Filled 2018-09-16 (×10): qty 1

## 2018-09-16 MED ORDER — SIMVASTATIN 10 MG PO TABS
40.0000 mg | ORAL_TABLET | Freq: Every day | ORAL | Status: DC
  Administered 2018-09-17 – 2018-09-18 (×3): 40 mg via ORAL
  Filled 2018-09-16 (×2): qty 4

## 2018-09-16 MED ORDER — IPRATROPIUM-ALBUTEROL 0.5-2.5 (3) MG/3ML IN SOLN
3.0000 mL | Freq: Once | RESPIRATORY_TRACT | Status: AC
  Administered 2018-09-16: 3 mL via RESPIRATORY_TRACT
  Filled 2018-09-16: qty 3

## 2018-09-16 MED ORDER — CYCLOBENZAPRINE HCL 10 MG PO TABS
10.0000 mg | ORAL_TABLET | Freq: Three times a day (TID) | ORAL | Status: DC | PRN
  Administered 2018-09-17 – 2018-09-19 (×3): 10 mg via ORAL
  Filled 2018-09-16 (×2): qty 1

## 2018-09-16 MED ORDER — METHYLPREDNISOLONE SODIUM SUCC 125 MG IJ SOLR
125.0000 mg | Freq: Once | INTRAMUSCULAR | Status: AC
  Administered 2018-09-16: 125 mg via INTRAVENOUS
  Filled 2018-09-16: qty 2

## 2018-09-16 MED ORDER — BUDESONIDE 0.5 MG/2ML IN SUSP
0.5000 mg | Freq: Two times a day (BID) | RESPIRATORY_TRACT | Status: DC
  Administered 2018-09-16 – 2018-09-19 (×7): 0.5 mg via RESPIRATORY_TRACT
  Filled 2018-09-16 (×7): qty 2

## 2018-09-16 MED ORDER — FUROSEMIDE 40 MG PO TABS
40.0000 mg | ORAL_TABLET | Freq: Two times a day (BID) | ORAL | Status: DC
  Administered 2018-09-16 – 2018-09-19 (×6): 40 mg via ORAL
  Filled 2018-09-16 (×5): qty 1

## 2018-09-16 MED ORDER — FLUTICASONE PROPIONATE 50 MCG/ACT NA SUSP
2.0000 | Freq: Every day | NASAL | Status: DC
  Administered 2018-09-17 – 2018-09-19 (×3): 2 via NASAL
  Filled 2018-09-16: qty 16

## 2018-09-16 MED ORDER — ARIPIPRAZOLE 5 MG PO TABS
5.0000 mg | ORAL_TABLET | Freq: Every day | ORAL | Status: DC
  Administered 2018-09-16 – 2018-09-18 (×3): 5 mg via ORAL
  Filled 2018-09-16 (×5): qty 1

## 2018-09-16 MED ORDER — FLUOXETINE HCL 20 MG PO CAPS
80.0000 mg | ORAL_CAPSULE | Freq: Every day | ORAL | Status: DC
  Administered 2018-09-16 – 2018-09-19 (×4): 80 mg via ORAL
  Filled 2018-09-16 (×4): qty 4

## 2018-09-16 MED ORDER — POTASSIUM CHLORIDE CRYS ER 10 MEQ PO TBCR
10.0000 meq | EXTENDED_RELEASE_TABLET | Freq: Every day | ORAL | Status: DC
  Administered 2018-09-16 – 2018-09-19 (×4): 10 meq via ORAL
  Filled 2018-09-16 (×4): qty 1

## 2018-09-16 MED ORDER — PRIMIDONE 50 MG PO TABS
50.0000 mg | ORAL_TABLET | Freq: Every day | ORAL | Status: DC
  Administered 2018-09-16 – 2018-09-18 (×3): 50 mg via ORAL
  Filled 2018-09-16 (×5): qty 1

## 2018-09-16 MED ORDER — BUPROPION HCL ER (XL) 150 MG PO TB24
150.0000 mg | ORAL_TABLET | Freq: Every day | ORAL | Status: DC
  Administered 2018-09-16 – 2018-09-19 (×4): 150 mg via ORAL
  Filled 2018-09-16 (×4): qty 1

## 2018-09-16 MED ORDER — MAGNESIUM OXIDE 400 (241.3 MG) MG PO TABS
400.0000 mg | ORAL_TABLET | Freq: Every day | ORAL | Status: DC
  Administered 2018-09-16 – 2018-09-19 (×4): 400 mg via ORAL
  Filled 2018-09-16 (×4): qty 1

## 2018-09-16 MED ORDER — ACETAMINOPHEN 325 MG PO TABS
650.0000 mg | ORAL_TABLET | Freq: Four times a day (QID) | ORAL | Status: DC | PRN
  Administered 2018-09-17: 650 mg via ORAL
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
## 2018-09-16 NOTE — ED Notes (Signed)
Pt asleep and resting at this time.

## 2018-09-16 NOTE — ED Notes (Signed)
Physical Therapy in room to assess patient. Will continue to monitor.

## 2018-09-16 NOTE — ED Notes (Signed)
Report given to Rebecca RN.

## 2018-09-16 NOTE — ED Notes (Signed)
Pt's son at bedside

## 2018-09-16 NOTE — ED Notes (Signed)
Dr. Alfred Levins in room to update patient on plan of care.  Will continue to monitor.
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
## 2018-09-16 NOTE — ED Notes (Signed)
Called Pharmacy and spoke with Gerald Stabs and he stated they would send up patients medications once verified and completed.

## 2018-09-16 NOTE — ED Notes (Signed)
Called Lisa from Pharmacy and informed her that I was missing 2 medications. She stated they would send up shortly.

## 2018-09-16 NOTE — ED Notes (Signed)
Pt checked and dry at this time. 
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
## 2018-09-16 NOTE — ED Notes (Signed)
Hospital bed requested for patient throughout her stay in the ED

## 2018-09-16 NOTE — NC FL2 (Addendum)
[place] LEVEL OF CARE SCREENING TOOL     IDENTIFICATION  Patient Name: Sara Pierce Birthdate: [birth year] Sex: female Admission Date (Current Location): 09/16/2018  [place] and [place] Number:  Engineering geologist and Address:  [facility], [address] [place], [place] [address]      Provider Number: [id number]  Attending Physician Name and Address:  Rudene Re, MD  Relative Name and Phone Number:       Current Level of Care: Hospital Recommended Level of Care: [place] Prior Approval Number:    Date Approved/Denied:   PASRR Number:   [id number]  Discharge Plan: SNF    Current Diagnoses: Patient Active Problem List   Diagnosis Date Noted  . Perirectal abscess 06/02/2018  . Chronic diastolic heart failure ([place]) 05/12/2018  . NSTEMI (non-ST elevated myocardial infarction) ([place]) 04/15/2018  . COPD (chronic obstructive pulmonary disease) ([place]) 01/04/2018  . Pressure injury of skin 10/25/2017  . Absolute anemia 03/15/2015  . Anxiety 03/15/2015  . Arthritis 03/15/2015  . Cataract 03/15/2015  . Chronic constipation 03/15/2015  . Blood clotting disorder ([place]) 03/15/2015  . CAFL (chronic airflow limitation) ([place]) 03/15/2015  . Clinical depression 03/15/2015  . Diabetes ([place]) 03/15/2015  . H/O varicella 03/15/2015  . BP (high blood pressure) 03/15/2015  . Mixed incontinence 03/15/2015  . 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 Adiposity 03/15/2015  . Apnea, sleep 03/15/2015  . Disease of thyroid gland 03/15/2015  . FOM (frequency of micturition) 03/15/2015  . 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 Candida vaginitis 03/15/2015  . Abnormal ECG 03/15/2015    Orientation RESPIRATION BLADDER Height & Weight     Self, Time, Situation, Place  O2(3 litres) Incontinent Weight: 210 lb 1.6 oz (95.3 kg) Height:     BEHAVIORAL SYMPTOMS/MOOD NEUROLOGICAL BOWEL NUTRITION  STATUS      Continent Diet(Low sodium)  AMBULATORY STATUS COMMUNICATION OF NEEDS Skin   Extensive Assist Verbally Normal                       Personal Care Assistance Level of Assistance  Bathing, Feeding, Dressing, Total care Bathing Assistance: Maximum assistance Feeding assistance: Independent Dressing Assistance: Maximum assistance Total Care Assistance: Maximum assistance   Functional Limitations Info  Sight, Hearing, Speech Sight Info: Adequate Hearing Info: Adequate Speech Info: Adequate    SPECIAL CARE FACTORS FREQUENCY                       Contractures Contractures Info: Not present    Additional Factors Info  Code Status, Allergies Code Status Info: Full code Allergies Info: Aspirin, Codeine, Naprosyn Naproxen, Other, Tape, Valacyclovir, Latex           Current Medications (09/16/2018):  This is the current hospital active medication list No current facility-administered medications for this encounter.    Current Outpatient Medications  Medication Sig Dispense Refill  . buPROPion (WELLBUTRIN XL) 150 MG 24 hr tablet Take 150 mg by mouth daily.    . furosemide (LASIX) 20 MG tablet Take 1 tablet (20 mg total) by mouth 2 (two) times daily. (Patient taking differently: Take 40 mg by mouth 2 (two) times daily. ) 30 tablet   . gabapentin (NEURONTIN) 100 MG capsule Take 2 capsules (200 mg total) by mouth 3 (three) times daily. (Patient taking differently: Take 300 mg by mouth 3 (three) times daily. )    . magnesium oxide (  MAG-OX) 400 MG tablet Take 400 mg by mouth daily.    . montelukast (SINGULAIR) 10 MG tablet Take 10 mg by mouth at bedtime.     . potassium chloride (K-DUR) 10 MEQ tablet Take 10 mEq by mouth daily.     . primidone (MYSOLINE) 50 MG tablet Take 50 mg by mouth at bedtime.     . simvastatin (ZOCOR) 40 MG tablet Take 40 mg by mouth at bedtime.     Marland Kitchen acetaminophen (TYLENOL) 325 MG tablet Take 2 tablets (650 mg total) by mouth every 6 (six)  hours as needed for mild pain (or Fever >/= 101).    . ARIPiprazole (ABILIFY) 5 MG tablet Take 5 mg by mouth at bedtime.    Marland Kitchen azithromycin (ZITHROMAX) 250 MG tablet Take 1 tablet (250 mg total) by mouth daily. Take 1 tablet 5 days per week (Monday thru Friday). 21 tablet 0  . budesonide (PULMICORT) 0.5 MG/2ML nebulizer solution Take 2 mLs (0.5 mg total) 2 (two) times daily by nebulization.  12  . clonazePAM (KLONOPIN) 1 MG tablet Take 1 tablet (1 mg total) by mouth 2 (two) times daily. 30 tablet 0  . clopidogrel (PLAVIX) 75 MG tablet Take 1 tablet (75 mg total) by mouth daily.    . cyclobenzaprine (FLEXERIL) 10 MG tablet Take 10 mg by mouth 3 (three) times daily as needed for muscle spasms.    Marland Kitchen FLUoxetine (PROZAC) 40 MG capsule Take 80 mg by mouth daily.    . fluticasone (FLONASE) 50 MCG/ACT nasal spray Place 2 sprays into both nostrils daily.    Marland Kitchen guaiFENesin (MUCINEX) 600 MG 12 hr tablet Take 1 tablet (600 mg total) by mouth 2 (two) times daily.    Marland Kitchen guaiFENesin (MUCINEX) 600 MG 12 hr tablet Take 1 tablet (600 mg total) by mouth 2 (two) times daily. 60 tablet 1  . hydrOXYzine (ATARAX/VISTARIL) 25 MG tablet Take 25 mg by mouth 3 (three) times daily as needed.    Marland Kitchen ipratropium-albuterol (DUONEB) 0.5-2.5 (3) MG/3ML SOLN Take 3 mLs by nebulization 4 (four) times daily. Dx:J44.9 360 mL 11  . mirtazapine (REMERON) 15 MG tablet Take 7.5 mg by mouth at bedtime.    Marland Kitchen omeprazole (PRILOSEC) 20 MG capsule Take 20 mg by mouth daily.    . polyethylene glycol (MIRALAX / GLYCOLAX) packet Take 17 g by mouth daily.    Marland Kitchen umeclidinium-vilanterol (ANORO ELLIPTA) 62.5-25 MCG/INH AEPB Inhale 1 puff into the lungs daily.       Discharge Medications: Please see discharge summary for a list of discharge medications.  Relevant Imaging Results:  Relevant Lab Results:   Additional Information [id number]  Joana Reamer, [place]

## 2018-09-16 NOTE — ED Notes (Signed)
Patient ate 100% of her lunch tray.  Patient is in no obvious distress at this time.  Will continue to monitor.

## 2018-09-16 NOTE — ED Notes (Signed)
Pt requesting food tray - checking with nurse to see if tray ordered

## 2018-09-16 NOTE — ED Notes (Signed)
Pt moved to hospital bed and repositioned. Pt cleaned up and new duoderm applied to buttocks. Barrier cream also applied to bottom. Pt had large formed BM. Clean gown applied and sheets. Pt resting comfortably in bed at this time.
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
## 2018-09-16 NOTE — ED Notes (Signed)
Pt given meal tray and drink. Pt watching TV and resting comfortably at this time.
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
## 2018-09-17 DIAGNOSIS — J441 Chronic obstructive pulmonary disease with (acute) exacerbation: Secondary | ICD-10-CM | POA: Diagnosis not present

## 2018-09-17 NOTE — ED Notes (Signed)
Pt is awake. Breakfast tray given. Will administer medication.

## 2018-09-17 NOTE — Progress Notes (Signed)
LCSW spoke to APS worker Randi Hammil to see which APS worker will be assigned to patient and she reported she would not know until Monday. She has agreed to have the new worker follow up with me in the morning.  [employer] LCSW [phone]

## 2018-09-17 NOTE — ED Notes (Signed)
Pt soiled with stool and urine. Diaper and chux changed. Pt repositioned in bed.
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
## 2018-09-17 NOTE — ED Notes (Signed)
Pt resting in bed. Given graham crackers, peanut butter, and decaf black coffee.

## 2018-09-17 NOTE — ED Notes (Signed)
Pt denies needs at this time. Denies pain. Eating lunch and watching TV.

## 2018-09-17 NOTE — ED Notes (Signed)
Pt currently asleep. Rise and fall of chest noted. Call bell within reach.

## 2018-09-17 NOTE — ED Notes (Signed)
Pt was soiled. Large stool and urine noted. Pt was cleaned well. New linens, purwick, diaper, and linens placed.

## 2018-09-17 NOTE — ED Notes (Signed)
Lunch tray provided. 

## 2018-09-17 NOTE — ED Notes (Signed)
Pt sleeping. Will administer meds once awake.

## 2018-09-17 NOTE — ED Notes (Signed)
Pt given dinner tray.

## 2018-09-18 ENCOUNTER — Encounter: Payer: Self-pay | Admitting: Physical Therapy

## 2018-09-18 ENCOUNTER — Ambulatory Visit: Payer: Medicare Other | Admitting: Physical Therapy

## 2018-09-18 DIAGNOSIS — J441 Chronic obstructive pulmonary disease with (acute) exacerbation: Secondary | ICD-10-CM | POA: Diagnosis not present
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
## 2018-09-18 NOTE — NC FL2 (Signed)
[place] LEVEL OF CARE SCREENING TOOL     IDENTIFICATION  Patient Name: Sara Pierce Birthdate: 10-01-1941 Sex: female Admission Date (Current Location): 09/16/2018  [place] and [place] Number:  Engineering geologist and Address:  [facility], [address] [place], [place] [address]      Provider Number: [id number]  Attending Physician Name and Address:  No att. providers found  Relative Name and Phone Number:       Current Level of Care: Hospital Recommended Level of Care: [place] Prior Approval Number:    Date Approved/Denied:   PASRR Number:   [id number]  Discharge Plan: SNF    Current Diagnoses: Patient Active Problem List   Diagnosis Date Noted  . Perirectal abscess 06/02/2018  . Chronic diastolic heart failure ([place]) 05/12/2018  . NSTEMI (non-ST elevated myocardial infarction) ([place]) 04/15/2018  . COPD (chronic obstructive pulmonary disease) ([place]) 01/04/2018  . Pressure injury of skin 10/25/2017  . Absolute anemia 03/15/2015  . Anxiety 03/15/2015  . Arthritis 03/15/2015  . Cataract 03/15/2015  . Chronic constipation 03/15/2015  . Blood clotting disorder ([place]) 03/15/2015  . CAFL (chronic airflow limitation) ([place]) 03/15/2015  . Clinical depression 03/15/2015  . Diabetes ([place]) 03/15/2015  . H/O varicella 03/15/2015  . BP (high blood pressure) 03/15/2015  . Mixed incontinence 03/15/2015  . 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 Adiposity 03/15/2015  . Apnea, sleep 03/15/2015  . Disease of thyroid gland 03/15/2015  . FOM (frequency of micturition) 03/15/2015  . 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 Candida vaginitis 03/15/2015  . Abnormal ECG 03/15/2015    Orientation RESPIRATION BLADDER Height & Weight     Self, Time, Situation, Place  O2(3 litres) Incontinent Weight: 210 lb 1.6 oz (95.3 kg) Height:     BEHAVIORAL SYMPTOMS/MOOD NEUROLOGICAL BOWEL NUTRITION  STATUS      Continent Diet(Low sodium)  AMBULATORY STATUS COMMUNICATION OF NEEDS Skin   Extensive Assist Verbally Normal                       Personal Care Assistance Level of Assistance  Bathing, Feeding, Dressing, Total care Bathing Assistance: Maximum assistance Feeding assistance: Independent Dressing Assistance: Maximum assistance Total Care Assistance: Maximum assistance   Functional Limitations Info  Sight, Hearing, Speech Sight Info: Adequate Hearing Info: Adequate Speech Info: Adequate    SPECIAL CARE FACTORS FREQUENCY                       Contractures Contractures Info: Not present    Additional Factors Info  Code Status, Allergies Code Status Info: Full code Allergies Info: Aspirin, Codeine, Naprosyn Naproxen, Other, Tape, Valacyclovir, Latex           Current Medications (09/18/2018):  This is the current hospital active medication list Current Facility-Administered Medications  Medication Dose Route Frequency Provider Last Rate Last Dose  . acetaminophen (TYLENOL) tablet 650 mg  650 mg Oral Q6H PRN Gouru, Aruna, MD   650 mg at 09/17/18 0028  . ARIPiprazole (ABILIFY) tablet 5 mg  5 mg Oral QHS Gouru, Aruna, MD   5 mg at 09/17/18 2212  . budesonide (PULMICORT) nebulizer solution 0.5 mg  0.5 mg Nebulization BID Gouru, Aruna, MD   0.5 mg at 09/18/18 0916  . buPROPion (WELLBUTRIN XL) 24 hr tablet 150 mg  150 mg Oral Daily Gouru, Aruna, MD   150 mg at 09/18/18 0912  .  cyclobenzaprine (FLEXERIL) tablet 10 mg  10 mg Oral TID PRN Nicholes Mango, MD   10 mg at 09/17/18 0027  . FLUoxetine (PROZAC) capsule 80 mg  80 mg Oral Daily Gouru, Aruna, MD   80 mg at 09/18/18 0913  . fluticasone (FLONASE) 50 MCG/ACT nasal spray 2 spray  2 spray Each Nare Daily Gouru, Aruna, MD   2 spray at 09/18/18 0917  . furosemide (LASIX) tablet 40 mg  40 mg Oral BID Gouru, Illene Silver, MD   40 mg at 09/18/18 0912  . gabapentin (NEURONTIN) capsule 300 mg  300 mg Oral TID Nicholes Mango, MD    300 mg at 09/18/18 0915  . magnesium oxide (MAG-OX) tablet 400 mg  400 mg Oral Daily Gouru, Aruna, MD   400 mg at 09/18/18 0915  . potassium chloride (K-DUR,KLOR-CON) CR tablet 10 mEq  10 mEq Oral Daily Gouru, Aruna, MD   10 mEq at 09/18/18 0916  . primidone (MYSOLINE) tablet 50 mg  50 mg Oral QHS Gouru, Aruna, MD   50 mg at 09/17/18 2213  . simvastatin (ZOCOR) tablet 40 mg  40 mg Oral QHS Gouru, Illene Silver, MD   40 mg at 09/17/18 2216   Current Outpatient Medications  Medication Sig Dispense Refill  . acetaminophen (TYLENOL) 325 MG tablet Take 2 tablets (650 mg total) by mouth every 6 (six) hours as needed for mild pain (or Fever >/= 101).    . ARIPiprazole (ABILIFY) 5 MG tablet Take 5 mg by mouth at bedtime.    . budesonide (PULMICORT) 0.5 MG/2ML nebulizer solution Take 2 mLs (0.5 mg total) 2 (two) times daily by nebulization.  12  . buPROPion (WELLBUTRIN XL) 150 MG 24 hr tablet Take 150 mg by mouth daily.    . cyclobenzaprine (FLEXERIL) 10 MG tablet Take 10 mg by mouth 3 (three) times daily as needed for muscle spasms.    Marland Kitchen FLUoxetine (PROZAC) 40 MG capsule Take 80 mg by mouth daily.    . fluticasone (FLONASE) 50 MCG/ACT nasal spray Place 2 sprays into both nostrils daily.    . furosemide (LASIX) 20 MG tablet Take 1 tablet (20 mg total) by mouth 2 (two) times daily. (Patient taking differently: Take 40 mg by mouth 2 (two) times daily. ) 30 tablet   . gabapentin (NEURONTIN) 100 MG capsule Take 2 capsules (200 mg total) by mouth 3 (three) times daily. (Patient taking differently: Take 300 mg by mouth 3 (three) times daily. )    . hydrOXYzine (ATARAX/VISTARIL) 25 MG tablet Take 25 mg by mouth 3 (three) times daily as needed.    Marland Kitchen ipratropium-albuterol (DUONEB) 0.5-2.5 (3) MG/3ML SOLN Take 3 mLs by nebulization 4 (four) times daily. Dx:J44.9 360 mL 11  . magnesium oxide (MAG-OX) 400 MG tablet Take 400 mg by mouth daily.    . mirtazapine (REMERON) 15 MG tablet Take 7.5 mg by mouth at bedtime.    .  montelukast (SINGULAIR) 10 MG tablet Take 10 mg by mouth at bedtime.     Marland Kitchen omeprazole (PRILOSEC) 20 MG capsule Take 20 mg by mouth daily as needed.     . polyethylene glycol (MIRALAX / GLYCOLAX) packet Take 17 g by mouth daily.    . potassium chloride (K-DUR) 10 MEQ tablet Take 10 mEq by mouth daily.     . primidone (MYSOLINE) 50 MG tablet Take 50 mg by mouth at bedtime.     . simvastatin (ZOCOR) 40 MG tablet Take 40 mg by mouth at bedtime.     Marland Kitchen  umeclidinium-vilanterol (ANORO ELLIPTA) 62.5-25 MCG/INH AEPB Inhale 1 puff into the lungs daily.    Marland Kitchen azithromycin (ZITHROMAX) 250 MG tablet Take 1 tablet (250 mg total) by mouth daily. Take 1 tablet 5 days per week (Monday thru Friday). (Patient not taking: Reported on 09/16/2018) 21 tablet 0  . clonazePAM (KLONOPIN) 1 MG tablet Take 1 tablet (1 mg total) by mouth 2 (two) times daily. (Patient not taking: Reported on 09/16/2018) 30 tablet 0  . clopidogrel (PLAVIX) 75 MG tablet Take 1 tablet (75 mg total) by mouth daily. (Patient not taking: Reported on 09/16/2018)    . guaiFENesin (MUCINEX) 600 MG 12 hr tablet Take 1 tablet (600 mg total) by mouth 2 (two) times daily. (Patient not taking: Reported on 09/16/2018)    . guaiFENesin (MUCINEX) 600 MG 12 hr tablet Take 1 tablet (600 mg total) by mouth 2 (two) times daily. (Patient not taking: Reported on 09/16/2018) 60 tablet 1     Discharge Medications: Please see discharge summary for a list of discharge medications.  Relevant Imaging Results:  Relevant Lab Results:   Additional Information [id number]  Joana Reamer, [place]

## 2018-09-18 NOTE — ED Notes (Signed)
Snack given.

## 2018-09-18 NOTE — Progress Notes (Signed)
Spoke to [place] from [place] and he is obtaining authorization- LCSW confirmed she does not need PT or OT. ( she would be coming in under APS- LTC medicaid)  Awaiting call back. CPS and [employer] APS and left message for Dortha Schwalbe [phone] to see who will be attached to patient   Enis Slipper LCSW [phone]

## 2018-09-18 NOTE — ED Notes (Signed)
Report received from Georgie RN. Patient care assumed. Patient/RN introduction complete. Will continue to monitor.   

## 2018-09-18 NOTE — ED Notes (Signed)
Pt noted to be resting comfortably with eyes closed, no distress noted at this time.

## 2018-09-18 NOTE — Progress Notes (Signed)
LCSW called and spoke to April on her cell phone and she is reviewing patient information and will let either Mel Almond and or myself  Know about bed offer. LCSW consulted with medical LCSW for assistance in placing patient.   [employer] LCSW [phone]

## 2018-09-18 NOTE — Progress Notes (Signed)
Called and left message for [facility] admissions director to call me back- Awaiting call back from new assigned APS worker. The plan is to have patient DC to [facility] today.  [employer] LCSW [phone]

## 2018-09-18 NOTE — Progress Notes (Addendum)
[facility] admissions coordinator made a bed offer for patient and will accept her under long term care medicaid. [facility] can accept patient tomorrow. Clinical Social Worker (CSW) met with patient and made her aware of above. CSW made patient aware that she would have to stay for 30 days and sign over her disability check to the facility. Patient verbalized her understanding and accepted bed offer. Plan is for patient to D/C to [facility] tomorrow. RN aware of above. Admissions coordinator at [facility] is aware of above. APS worker Otila Kluver is aware of above.   [employer], LCSW [phone]

## 2018-09-18 NOTE — ED Notes (Signed)
Pt incontinent of stool and urine, peri care done. Meds given per MD order.

## 2018-09-18 NOTE — ED Notes (Signed)
Pt resting in bed watching tv. 

## 2018-09-18 NOTE — Progress Notes (Signed)
Lcsw called and unable to leave message for April/ no answer  at [facility] [phone]  Will call later/check HUB in a bit info was sent to other [facility] SNF for consideration.  [employer] LCSW [phone]

## 2018-09-18 NOTE — Progress Notes (Signed)
LCSW received call from [place] at [facility] and he stated NO- admissions for this patient. He reported it was an [facility] ( she was there before). Reviewed HUB and all facilities selected have declined patient so far.  [employer] LCSW [phone]

## 2018-09-18 NOTE — Progress Notes (Signed)
Sara Pierce from [employer] care called and will be reviewing her information and will get back to coverage worker by early afternoon.  LCSW reviewed with patient the 2 possibilities and advised her she will need to sign over her income and stay 30 days.   [employer] LCSW [phone]

## 2018-09-19 DIAGNOSIS — J441 Chronic obstructive pulmonary disease with (acute) exacerbation: Secondary | ICD-10-CM | POA: Diagnosis not present

## 2018-09-19 NOTE — ED Notes (Signed)
Pt incontinent of urine, purewik in place.

## 2018-09-19 NOTE — ED Notes (Signed)
Pt watching tv and eating breakfast.

## 2018-09-19 NOTE — ED Notes (Signed)
Patient given coffee, graham crackers and peanut butter. Sitting up in bed watching tv at this time.

## 2018-09-19 NOTE — Progress Notes (Signed)
Per [facility] admissions coordinator patient can come today to room 318 bed B. RN will call report and arrange EMS for transport. Clinical Education officer, museum (CSW) sent signed FL2 to [facility] today via [facility]. Patient and her son Kyung Rudd are aware of above. Please reconsult if future social work needs arise. CSW signing off.   [employer], LCSW [phone]

## 2018-09-19 NOTE — ED Notes (Signed)
[facility] admissions coordinator made a bed offer for patient and will accept her under long term care medicaid.

## 2018-09-19 NOTE — ED Notes (Signed)
[facility]  CALLED FOR  TRANSPORT  TO  [place]

## 2018-09-19 NOTE — ED Provider Notes (Signed)
-----------------------------------------   7:15 AM on 09/19/2018 -----------------------------------------   Blood pressure (!) 119/50, pulse 68, temperature (!) 97.5 F (36.4 C), temperature source Oral, resp. rate 16, weight 95.3 kg, SpO2 98 %.  The patient had no acute events since last update.  Calm and cooperative at this time.  Disposition is pending clinical social work team recommendations.  I am told she was accepted at [facility].     Paulette Blanch, MD 09/19/18 [phone]

## 2018-09-19 NOTE — ED Notes (Signed)
Pt resting comfortably with eyes closed

## 2018-09-20 ENCOUNTER — Ambulatory Visit: Payer: Medicare Other | Admitting: Physical Therapy

## 2018-09-26 ENCOUNTER — Ambulatory Visit: Payer: Medicaid Other | Admitting: Physical Therapy

## 2018-09-28 ENCOUNTER — Ambulatory Visit: Payer: Medicaid Other | Admitting: Physical Therapy

## 2018-10-03 ENCOUNTER — Ambulatory Visit: Payer: Medicaid Other | Admitting: Physical Therapy

## 2018-10-05 ENCOUNTER — Ambulatory Visit: Payer: Medicaid Other | Admitting: Physical Therapy

## 2018-10-10 ENCOUNTER — Ambulatory Visit: Payer: Medicaid Other | Admitting: Physical Therapy

## 2018-10-12 ENCOUNTER — Ambulatory Visit: Payer: Medicaid Other | Admitting: Physical Therapy

## 2018-10-17 ENCOUNTER — Ambulatory Visit: Payer: Medicaid Other | Admitting: Physical Therapy

## 2018-10-19 ENCOUNTER — Ambulatory Visit: Payer: Medicaid Other | Admitting: Physical Therapy

## 2018-10-25 ENCOUNTER — Ambulatory Visit: Payer: Medicare Other | Admitting: Internal Medicine

## 2018-10-25 NOTE — Progress Notes (Deleted)
Sara Pierce    Assessment and Plan:  Intractable cough of bronchiectasis. -Needs aggressive airway clearance, continue duo nebs 3 times daily followed by flutter valve. - Patient was not able to do sputum culture, will order once again. - We will start on Mucinex 600 mg 3 times daily, she is currently taking an unknown known amount once daily, asked to call us back and tell us how much she is taking. -We will check sputum culture.   Chronic bronchitis with excess mucus production. - Suspect this is related to chronic bronchiectasis. - We will start azithromycin 250 mg 5 days weekly.  If not improved at next visit, will consider initiation of percussion vest. - Given her comorbidities and very poor functional status, bronchoscopy would likely be high risk.  Obstructive sleep apnea. - Not on cpap. - Continue oxygen at night and during the day.  Obesity. - Discussed importance of weight loss.   No orders of the defined types were placed in this encounter.  No follow-ups on file.   Date: 10/25/2018  MRN# [id number] Sara Pierce 31-Jan-1941  Referring Physician: Self referral.   Sara Pierce is a 77 y.o. old female seen in consultation for chief complaint of:    No chief complaint on file.   HPI:   Sara Pierce is a 77 y/o female with a history of clotting disorder, DM, HTN, CKD, thyroid disease, COPD, depression, obstructive sleep apnea, previous tobacco use and chronic heart failure.  Last visit he was noted that she had several hospital visits due to COPD exacerbation.  She was noted to have diagnosis of COPD with copious excess mucus production, she was asked to use DuoNeb 3 times daily followed by flutter valve, and to take Mucinex 600 mg 3 times daily.  She was sent for sputum culture which showed normal respiratory flora.  CT chest 04/17/2018 ,chest x-ray 04/21/2018>> there is bibasilar atelectasis with pleural effusions, volume overload with cardiomegaly,  pulmonary hypertension.  Left lower lobe atelectasis, small pleural effusion.  Echocardiogram 04/16/18>>EF is 55%  Medication:    Current Outpatient Medications:  .  acetaminophen (TYLENOL) 325 MG tablet, Take 2 tablets (650 mg total) by mouth every 6 (six) hours as needed for mild pain (or Fever >/= 101)., Disp: , Rfl:  .  ARIPiprazole (ABILIFY) 5 MG tablet, Take 5 mg by mouth at bedtime., Disp: , Rfl:  .  azithromycin (ZITHROMAX) 250 MG tablet, Take 1 tablet (250 mg total) by mouth daily. Take 1 tablet 5 days per week (Monday thru Friday). (Patient not taking: Reported on 09/16/2018), Disp: 21 tablet, Rfl: 0 .  budesonide (PULMICORT) 0.5 MG/2ML nebulizer solution, Take 2 mLs (0.5 mg total) 2 (two) times daily by nebulization., Disp: , Rfl: 12 .  buPROPion (WELLBUTRIN XL) 150 MG 24 hr tablet, Take 150 mg by mouth daily., Disp: , Rfl:  .  clonazePAM (KLONOPIN) 1 MG tablet, Take 1 tablet (1 mg total) by mouth 2 (two) times daily. (Patient not taking: Reported on 09/16/2018), Disp: 30 tablet, Rfl: 0 .  clopidogrel (PLAVIX) 75 MG tablet, Take 1 tablet (75 mg total) by mouth daily. (Patient not taking: Reported on 09/16/2018), Disp: , Rfl:  .  cyclobenzaprine (FLEXERIL) 10 MG tablet, Take 10 mg by mouth 3 (three) times daily as needed for muscle spasms., Disp: , Rfl:  .  FLUoxetine (PROZAC) 40 MG capsule, Take 80 mg by mouth daily., Disp: , Rfl:  .  fluticasone (FLONASE) 50 MCG/ACT nasal spray, Place  2 sprays into both nostrils daily., Disp: , Rfl:  .  furosemide (LASIX) 20 MG tablet, Take 1 tablet (20 mg total) by mouth 2 (two) times daily. (Patient taking differently: Take 40 mg by mouth 2 (two) times daily. ), Disp: 30 tablet, Rfl:  .  gabapentin (NEURONTIN) 100 MG capsule, Take 2 capsules (200 mg total) by mouth 3 (three) times daily. (Patient taking differently: Take 300 mg by mouth 3 (three) times daily. ), Disp: , Rfl:  .  guaiFENesin (MUCINEX) 600 MG 12 hr tablet, Take 1 tablet (600 mg total) by  mouth 2 (two) times daily. (Patient not taking: Reported on 09/16/2018), Disp: , Rfl:  .  guaiFENesin (MUCINEX) 600 MG 12 hr tablet, Take 1 tablet (600 mg total) by mouth 2 (two) times daily. (Patient not taking: Reported on 09/16/2018), Disp: 60 tablet, Rfl: 1 .  hydrOXYzine (ATARAX/VISTARIL) 25 MG tablet, Take 25 mg by mouth 3 (three) times daily as needed., Disp: , Rfl:  .  ipratropium-albuterol (DUONEB) 0.5-2.5 (3) MG/3ML SOLN, Take 3 mLs by nebulization 4 (four) times daily. Dx:J44.9, Disp: 360 mL, Rfl: 11 .  magnesium oxide (MAG-OX) 400 MG tablet, Take 400 mg by mouth daily., Disp: , Rfl:  .  mirtazapine (REMERON) 15 MG tablet, Take 7.5 mg by mouth at bedtime., Disp: , Rfl:  .  montelukast (SINGULAIR) 10 MG tablet, Take 10 mg by mouth at bedtime. , Disp: , Rfl:  .  omeprazole (PRILOSEC) 20 MG capsule, Take 20 mg by mouth daily as needed. , Disp: , Rfl:  .  polyethylene glycol (MIRALAX / GLYCOLAX) packet, Take 17 g by mouth daily., Disp: , Rfl:  .  potassium chloride (K-DUR) 10 MEQ tablet, Take 10 mEq by mouth daily. , Disp: , Rfl:  .  primidone (MYSOLINE) 50 MG tablet, Take 50 mg by mouth at bedtime. , Disp: , Rfl:  .  simvastatin (ZOCOR) 40 MG tablet, Take 40 mg by mouth at bedtime. , Disp: , Rfl:  .  umeclidinium-vilanterol (ANORO ELLIPTA) 62.5-25 MCG/INH AEPB, Inhale 1 puff into the lungs daily., Disp: , Rfl:    Allergies:  Aspirin; Codeine; Naprosyn [naproxen]; Other; Tape; Valacyclovir; and Latex        LABORATORY PANEL:   CBC No results for input(s): WBC, HGB, HCT, PLT in the last 168 hours. ------------------------------------------------------------------------------------------------------------------  Chemistries  No results for input(s): NA, K, CL, CO2, GLUCOSE, BUN, CREATININE, CALCIUM, MG, AST, ALT, ALKPHOS, BILITOT in the last 168 hours.  Invalid input(s):  GFRCGP ------------------------------------------------------------------------------------------------------------------  Cardiac Enzymes No results for input(s): TROPONINI in the last 168 hours. ------------------------------------------------------------  RADIOLOGY:  No results found.     Thank  you for the consultation and for allowing [facility] to assist in the care of your patient. Our recommendations are noted above.  Please contact us if we can be of further service.   Marda Stalker, M.D., F.C.C.P.  Board Certified in Internal [place], Pulmonary [place], [place], and Sleep [place].  [facility] Office Number: [phone]   10/25/2018

## 2018-10-26 ENCOUNTER — Encounter: Payer: Self-pay | Admitting: Internal Medicine

## 2018-11-13 ENCOUNTER — Other Ambulatory Visit: Payer: Self-pay

## 2018-11-13 ENCOUNTER — Emergency Department
Admission: EM | Admit: 2018-11-13 | Discharge: 2018-11-13 | Disposition: A | Discharge status: Skilled Nursing Facility | Payer: Medicare Other | Attending: Emergency Medicine | Admitting: Emergency Medicine

## 2018-11-13 DIAGNOSIS — Z7901 Long term (current) use of anticoagulants: Secondary | ICD-10-CM | POA: Diagnosis not present

## 2018-11-13 DIAGNOSIS — I509 Heart failure, unspecified: Secondary | ICD-10-CM | POA: Insufficient documentation

## 2018-11-13 DIAGNOSIS — Z91040 Latex allergy status: Secondary | ICD-10-CM | POA: Diagnosis not present

## 2018-11-13 DIAGNOSIS — E119 Type 2 diabetes mellitus without complications: Secondary | ICD-10-CM | POA: Insufficient documentation

## 2018-11-13 DIAGNOSIS — Z87891 Personal history of nicotine dependence: Secondary | ICD-10-CM | POA: Diagnosis not present

## 2018-11-13 DIAGNOSIS — N189 Chronic kidney disease, unspecified: Secondary | ICD-10-CM | POA: Diagnosis not present

## 2018-11-13 DIAGNOSIS — Z022 Encounter for examination for admission to residential institution: Secondary | ICD-10-CM | POA: Insufficient documentation

## 2018-11-13 DIAGNOSIS — J449 Chronic obstructive pulmonary disease, unspecified: Secondary | ICD-10-CM | POA: Insufficient documentation

## 2018-11-13 DIAGNOSIS — I5032 Chronic diastolic (congestive) heart failure: Secondary | ICD-10-CM | POA: Insufficient documentation

## 2018-11-13 DIAGNOSIS — I130 Hypertensive heart and chronic kidney disease with heart failure and stage 1 through stage 4 chronic kidney disease, or unspecified chronic kidney disease: Secondary | ICD-10-CM | POA: Insufficient documentation

## 2018-11-13 DIAGNOSIS — Z0000 Encounter for general adult medical examination without abnormal findings: Secondary | ICD-10-CM
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
## 2018-11-13 NOTE — ED Notes (Signed)
[facility] here to transport pt

## 2018-11-13 NOTE — ED Notes (Signed)
Per Dr Alfred Levins no labs needed at this time

## 2018-11-13 NOTE — ED Notes (Signed)
Paper copy of discharge signed

## 2018-11-13 NOTE — ED Notes (Addendum)
Dr Alfred Levins cleared pt for [place] worker at bedside and states that pt needs transportation to the facility [facility]) and that she has been accepted Nurse, learning disability notified to call [facility] or [facility] for transport to [facility] in [place]
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
## 2019-01-23 IMAGING — CR DG CHEST 2V
2 series · 2 of 2 positions shown · non-contrast
Comparison: Chest radiograph 10/03/2016.

CLINICAL DATA: Patient with productive cough and shortness of
breath for 3- 4 days.

EXAM:
CHEST  2 VIEW

[chest pa]
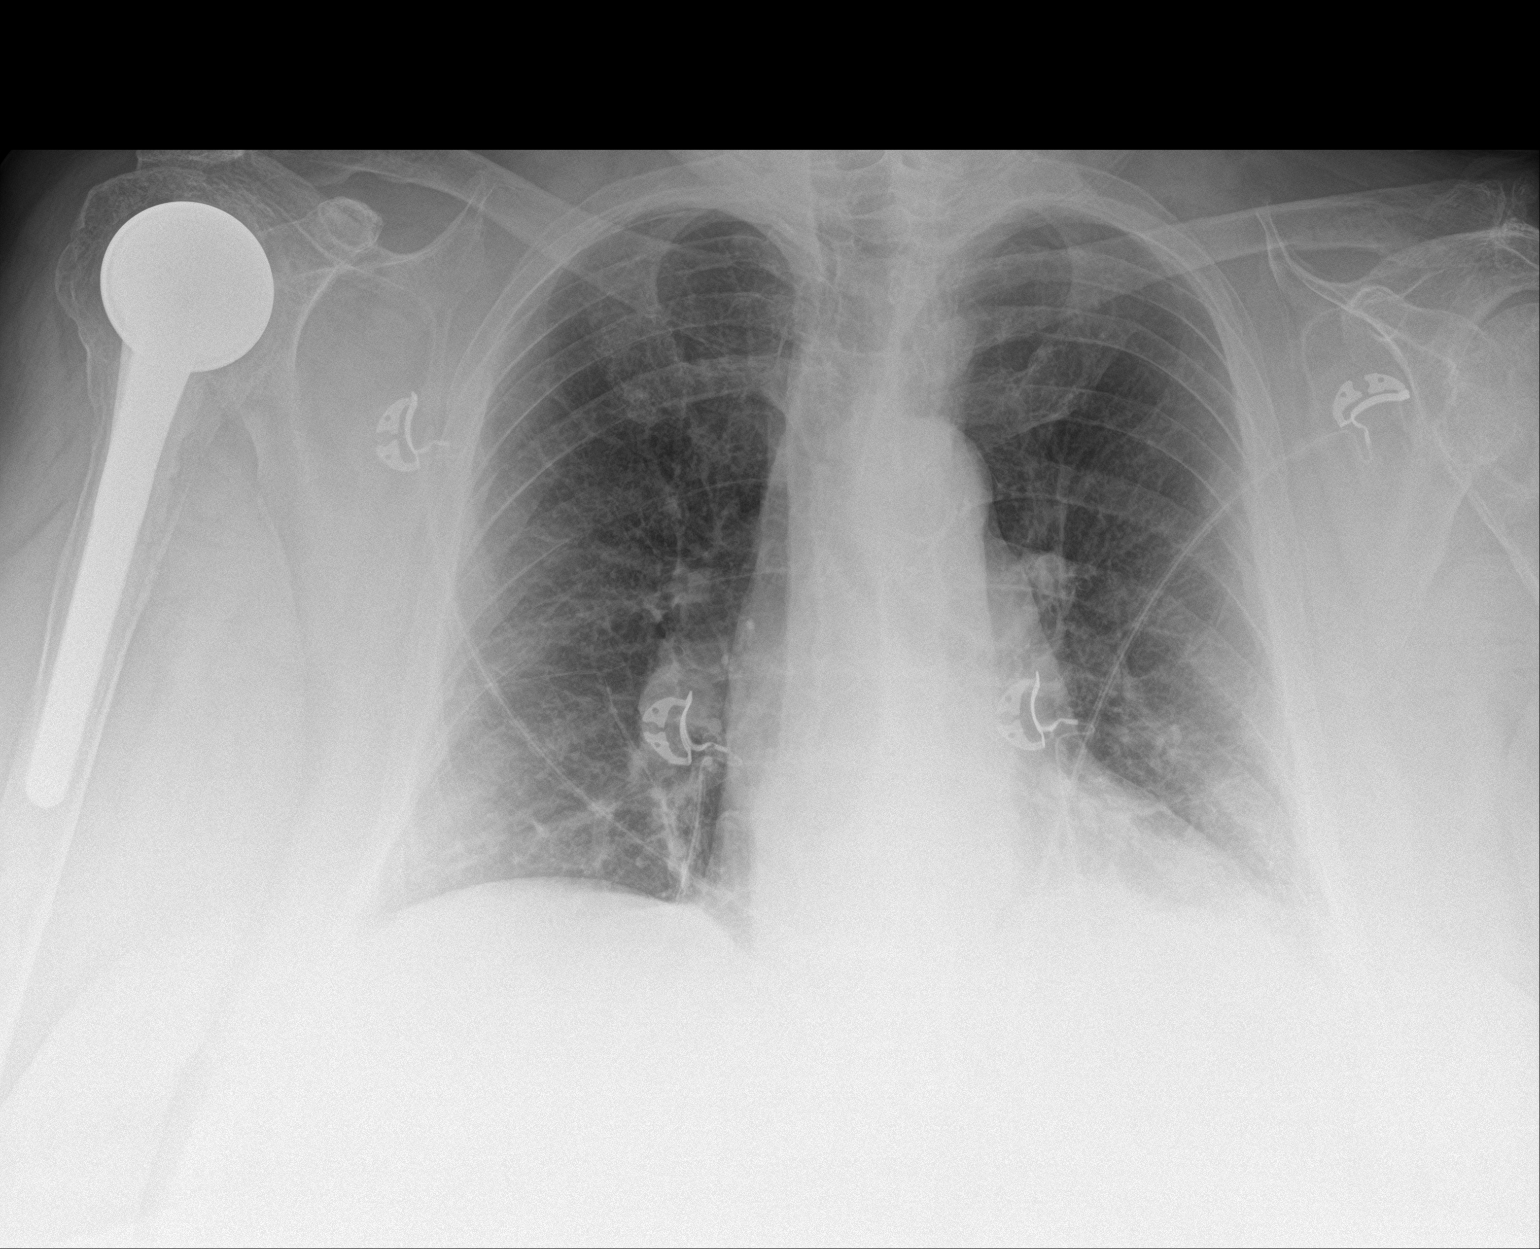

[chest lat]
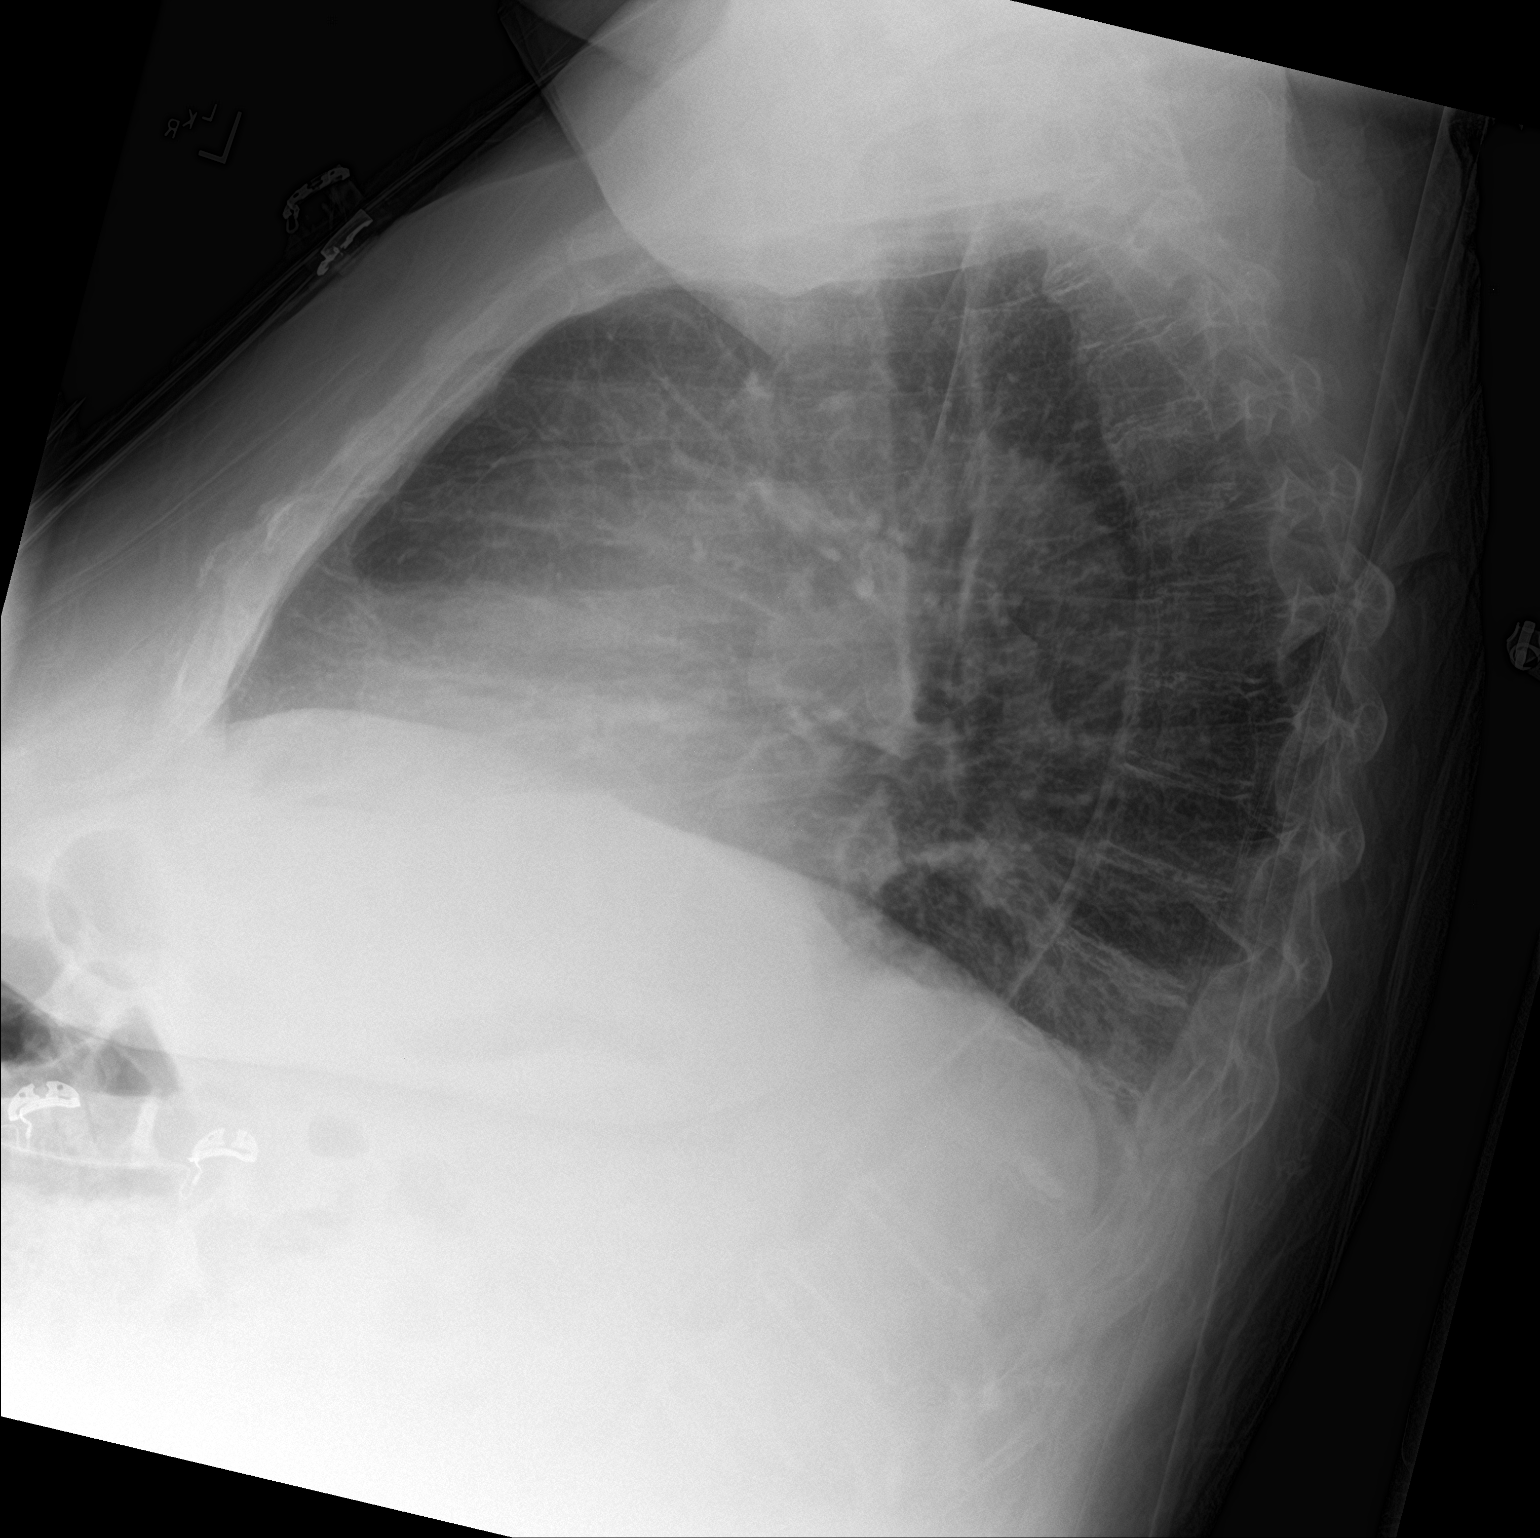

[2 of 2 positions shown; findings below may reference images not displayed]

FINDINGS: Multiple monitoring leads overlie the patient. Stable cardiac and
mediastinal contours. Patchy consolidative opacities left lung base.
No pleural effusion or pneumothorax. Thoracic spine degenerative
changes.
IMPRESSION: Patchy consolidation left lung base may represent atelectasis or
infection.

## 2019-02-13 ENCOUNTER — Encounter (INDEPENDENT_AMBULATORY_CARE_PROVIDER_SITE_OTHER): Payer: Self-pay | Admitting: Orthopaedic Surgery

## 2019-02-13 ENCOUNTER — Ambulatory Visit (INDEPENDENT_AMBULATORY_CARE_PROVIDER_SITE_OTHER): Payer: Medicare Other | Admitting: Orthopaedic Surgery

## 2019-02-13 ENCOUNTER — Ambulatory Visit (INDEPENDENT_AMBULATORY_CARE_PROVIDER_SITE_OTHER): Payer: Medicare Other

## 2019-02-13 DIAGNOSIS — M79642 Pain in left hand: Secondary | ICD-10-CM

## 2019-02-13 NOTE — Addendum Note (Signed)
Addended by: Precious Bard on: 02/13/2019 11:16 AM   Modules accepted: Orders

## 2019-02-13 NOTE — Progress Notes (Signed)
Office Visit Note   Patient: Sara Pierce           Date of Birth: 1941-08-02           MRN: [id number] Visit Date: 02/13/2019              Requested by: Marygrace Drought, MD [address], [place] [id number] PCP: Marygrace Drought, MD   Assessment & Plan: Visit Diagnoses:  1. Pain in left hand     Plan: Impression is left hand flexion contractures versus denervation from the ulnar nerve following left elbow surgery.  We will obtain a nerve conduction study/EMG to further evaluate for nerve damage.  She will follow-up with [place] once that has been completed.  Follow-Up Instructions: Return in about 2 weeks (around 02/27/2019).   Orders:  Orders Placed This Encounter  Procedures  . XR Hand Complete Left   No orders of the defined types were placed in this encounter.     Procedures: No procedures performed   Clinical Data: No additional findings.   Subjective: Chief Complaint  Patient presents with  . Left Knee - Pain    HPI patient is a pleasant 78 year old right-hand-dominant female who presents to our clinic today with pain in the inability to straighten the fingers of the left hand.  She noticed this approximately 1-1/2 years ago following ORIF of the left elbow fracture by surgeon in [place].  No specific injury to the left hand or fingers.  She does reside at a skilled nursing facility and has been evaluated by OT.  She is not wearing any type of bracing.  The pain she has when she is trying to straighten the fingers.  She does note decreased sensation to all 5 fingers.  Review of Systems as detailed in HPI.  All others reviewed and are negative.   Objective: Vital Signs: There were no vitals taken for this visit.  Physical Exam well-developed and well-nourished female in no acute distress.  Alert and oriented x3.  Ortho Exam examination of her left hand shows what appears to be flexion contractures to all 5 fingers worse in the  long and ring fingers.  She is unable to actively extend any of her fingers.  I can hardly extend them due to pain.  Decreased sensation to all 5 fingers.  Specialty Comments:  No specialty comments available.  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 Perirectal abscess 06/02/2018  . Chronic diastolic heart failure ([place]) 05/12/2018  . NSTEMI (non-ST elevated myocardial infarction) ([place]) 04/15/2018  . COPD (chronic obstructive pulmonary disease) ([place]) 01/04/2018  . Pressure injury of skin 10/25/2017  . Absolute anemia 03/15/2015  . Anxiety 03/15/2015  . Arthritis 03/15/2015  . Cataract 03/15/2015  . Chronic constipation 03/15/2015  . Blood clotting disorder ([place]) 03/15/2015  . CAFL (chronic airflow limitation) ([place]) 03/15/2015  . Clinical depression 03/15/2015  . Diabetes ([place]) 03/15/2015  . H/O varicella 03/15/2015  . BP (high blood pressure) 03/15/2015  . Mixed incontinence 03/15/2015  . 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 Adiposity 03/15/2015  . Apnea, sleep 03/15/2015  . Disease of thyroid gland 03/15/2015  . FOM (frequency of micturition) 03/15/2015  . 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 Candida vaginitis 03/15/2015  . Abnormal ECG 03/15/2015   Past Medical History:  Diagnosis Date  . Arthritis   . Cardiomyopathy ([place])   . Cataract   . Cerebral hemorrhage ([place])   . CHF (congestive heart failure) ([place])   . Chronic kidney disease   . Clotting disorder ([place])   . COPD (chronic obstructive pulmonary disease) ([place])   . Depression   . Diabetes mellitus without complication ([place])   . Hypertension   . Mixed incontinence   . Obesity   . Sleep apnea   . Thyroid disease   . Urinary frequency   . Vaginal atrophy   . Varicose veins     Family History  Problem Relation Age of Onset  . Heart attack  Father   . Coronary artery disease Father   . Hypertension Father   . Hyperlipidemia Father   . Heart attack Mother   . Hypertension Mother   . Hyperlipidemia Mother   . Breast cancer Neg Hx     Past Surgical History:  Procedure Laterality Date  . APPENDECTOMY    . Cardiac Catherization    . COLONOSCOPY WITH PROPOFOL N/A 04/28/2016   Procedure: COLONOSCOPY WITH PROPOFOL;  Surgeon: Manya Silvas, MD;  Location: [facility] ENDOSCOPY;  Service: Endoscopy;  Laterality: N/A;  . COLONOSCOPY WITH PROPOFOL N/A 04/29/2016   Procedure: COLONOSCOPY WITH PROPOFOL;  Surgeon: Manya Silvas, MD;  Location: [facility] ENDOSCOPY;  Service: Endoscopy;  Laterality: N/A;  . JOINT REPLACEMENT    . STRABISMUS SURGERY    . TONSILLECTOMY    . TUBAL LIGATION     Social History   Occupational History  . Occupation: retired  Tobacco Use  . Smoking status: Former Smoker    Packs/day: 0.50    Years: 44.00    Pack years: 22.00    Types: Cigarettes  . Smokeless tobacco: Never Used  Substance and Sexual Activity  . Alcohol use: No  . Drug use: No  . Sexual activity: Never

## 2019-02-27 ENCOUNTER — Ambulatory Visit (INDEPENDENT_AMBULATORY_CARE_PROVIDER_SITE_OTHER): Payer: Medicare Other | Admitting: Orthopaedic Surgery

## 2019-03-07 ENCOUNTER — Encounter (INDEPENDENT_AMBULATORY_CARE_PROVIDER_SITE_OTHER): Payer: Self-pay | Admitting: Physical Medicine and Rehabilitation

## 2019-03-22 IMAGING — CT CT CHEST W/ CM
2 of 5 series · 13 of 36 positions shown, 16 images · IV contrast (iopamidol)
Comparison: Abdomen pelvis CT dated 10/11/2013. Portable chest
obtained earlier today.

CLINICAL DATA: Left humerus compound fracture after falling down 3
steps onto a brick walkway.

EXAM:
CT CHEST, ABDOMEN, AND PELVIS WITH CONTRAST
TECHNIQUE: Multidetector CT imaging of the chest, abdomen and pelvis was
performed following the standard protocol during bolus
administration of intravenous contrast.
CONTRAST:  100mL [id number] IOPAMIDOL (DZFRYR-311) INJECTION 61%

[Series 2: cap with · axial · 0.98mm/px · z∈[-796,-301]mm · 10 of 123 slices shown, 13 images]
[im 12/123  mediastinal]
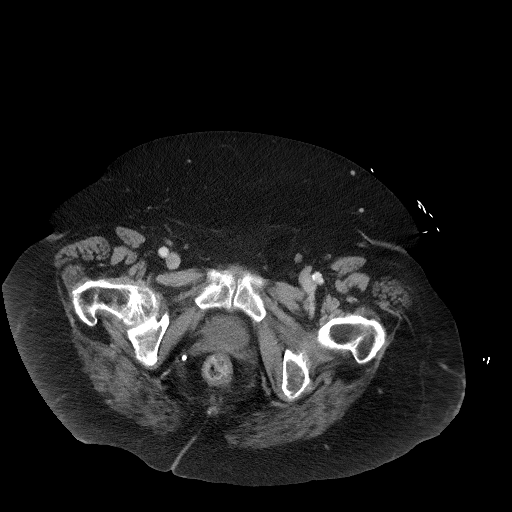
[im 12/123  lung]
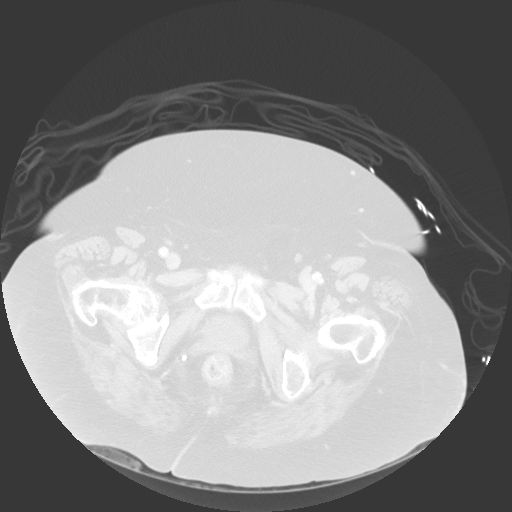
[im 23/123  lung]
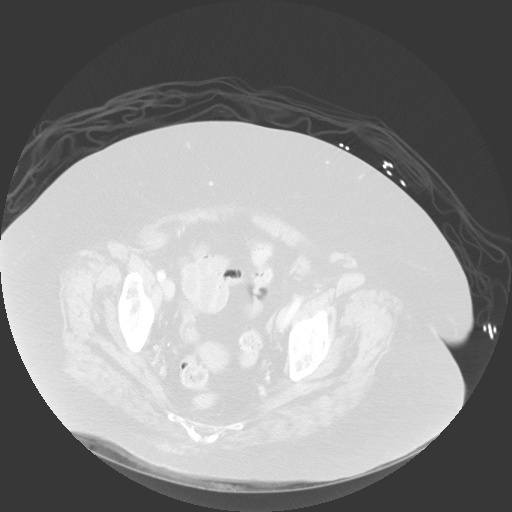
[im 34/123  lung]
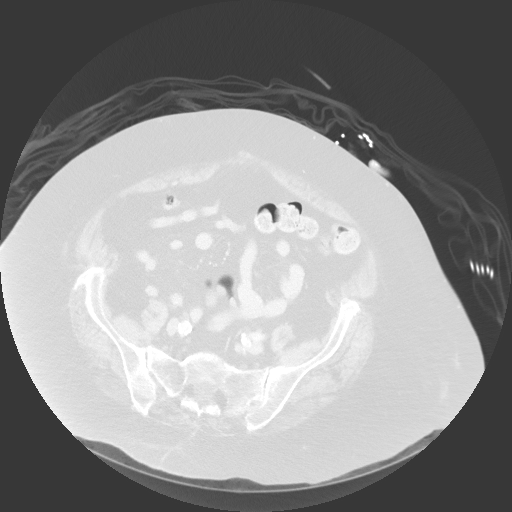
[im 45/123  lung]
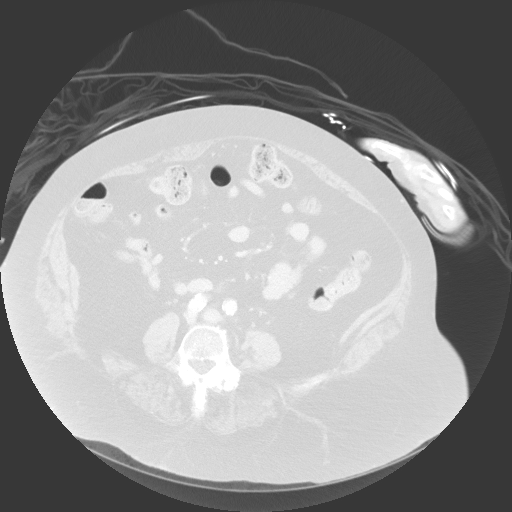
[im 56/123  mediastinal]
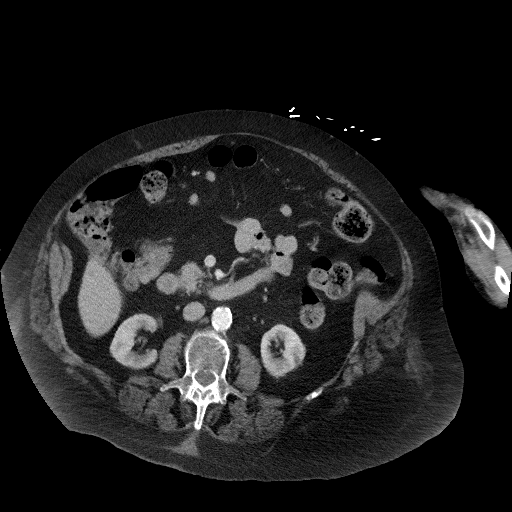
[im 56/123  lung]
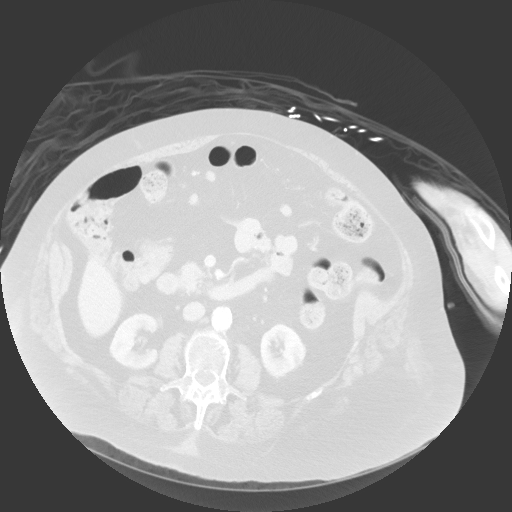
[im 67/123  lung]
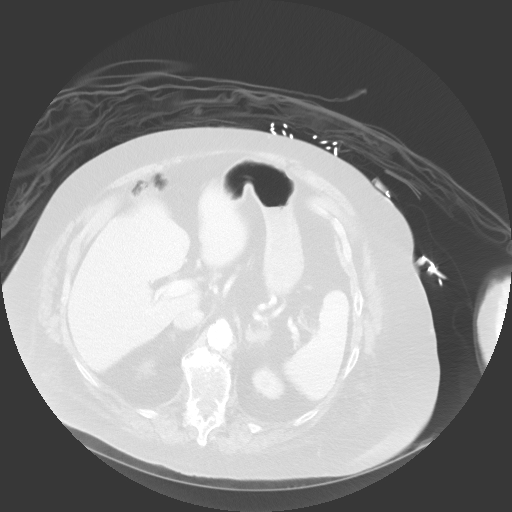
[im 78/123  lung]
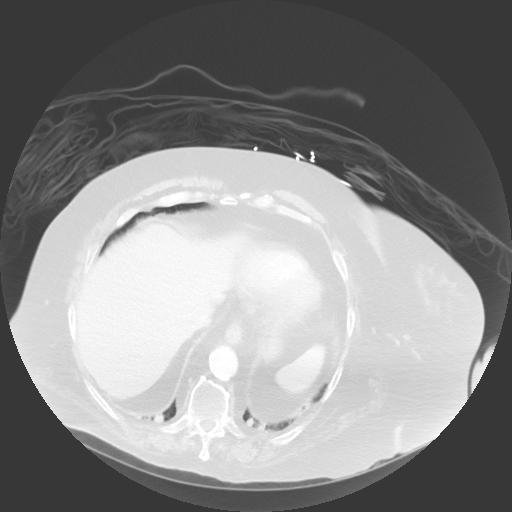
[im 89/123  lung]
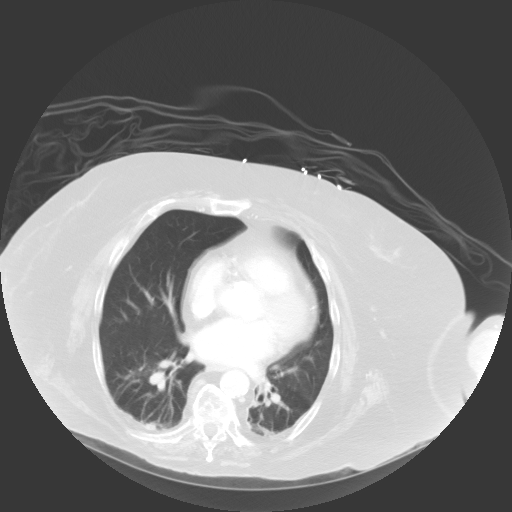
[im 100/123  mediastinal]
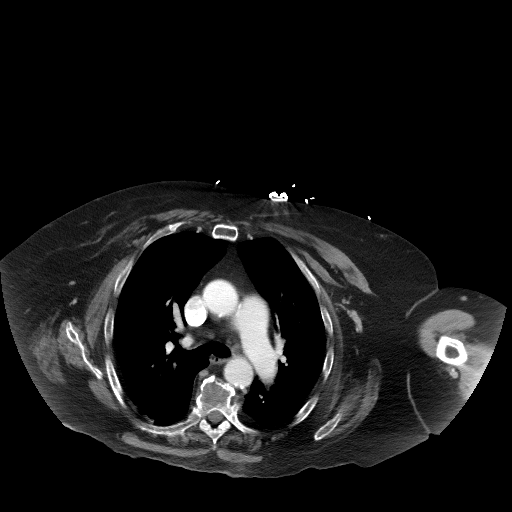
[im 100/123  lung]
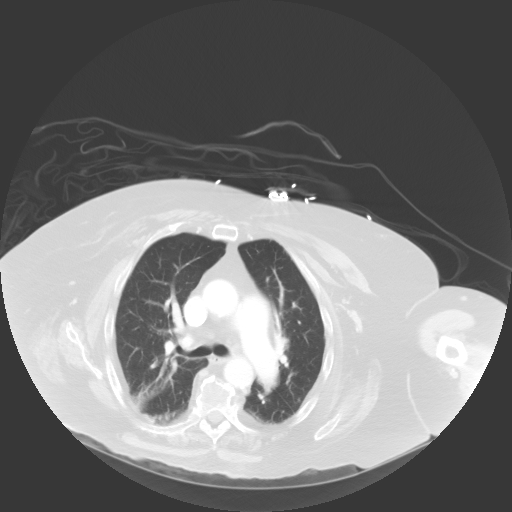
[im 111/123  lung]
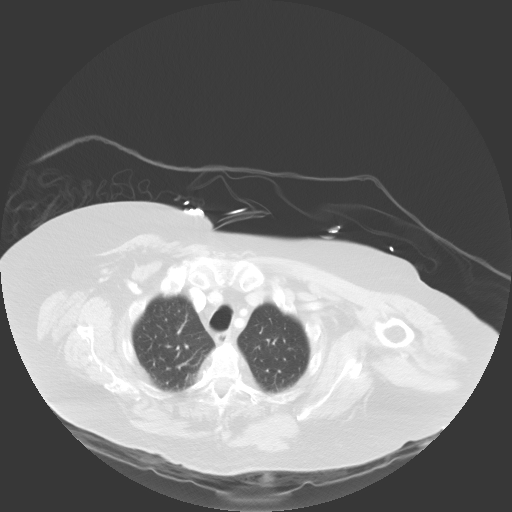

[Series 5: coronals · coronal · 0.82mm/px · 3 of 168 slices shown]
[im 34/168  lung]
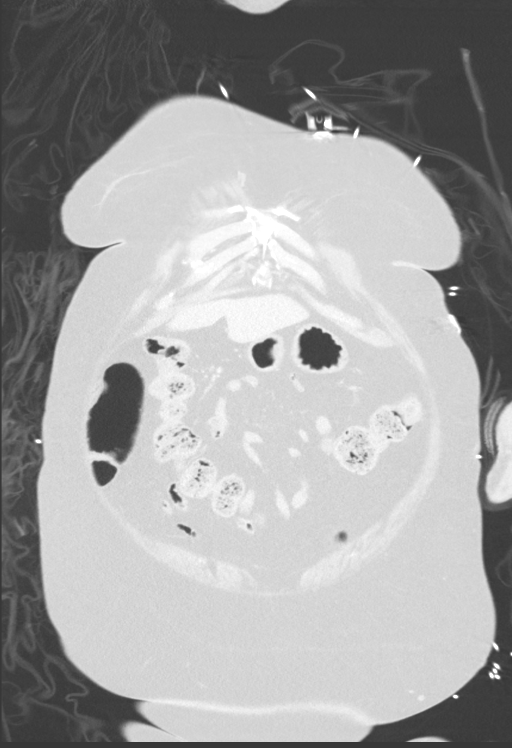
[im 67/168  lung]
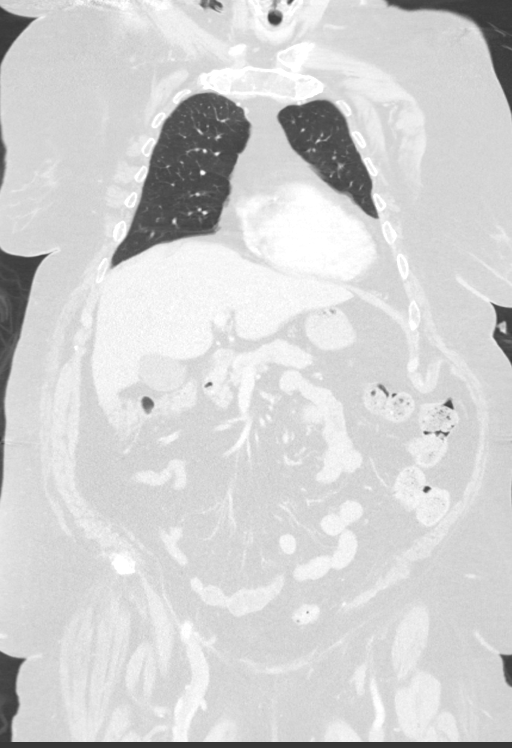
[im 101/168  lung]
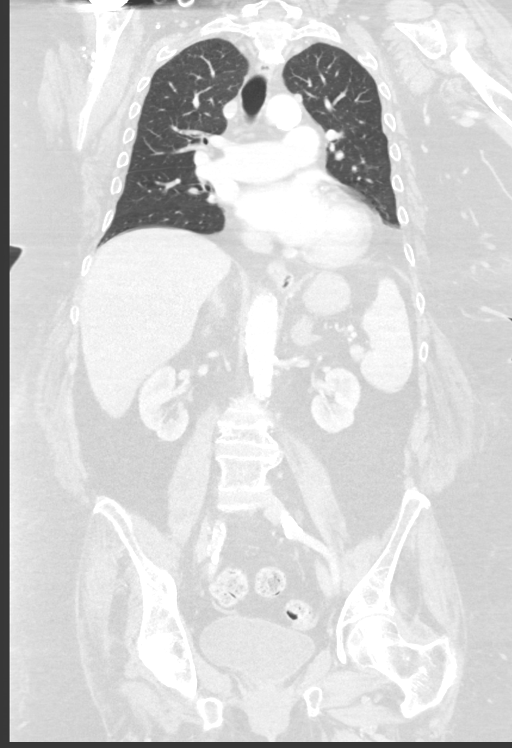

[13 of 36 positions shown; findings below may reference images not displayed]

FINDINGS: CT CHEST FINDINGS

Cardiovascular: Atheromatous arterial calcifications, including the
coronary arteries and aorta. Borderline enlarged heart.

Mediastinum/Nodes: Small hiatal hernia. No enlarged lymph nodes.
Unremarkable thyroid gland.

Lungs/Pleura: Mild bilateral lower lobe and right upper lobe
dependent atelectasis. No pneumothorax or pleural fluid. No lung
nodules.

Musculoskeletal: Right shoulder prosthesis. Old, healed left humeral
neck fracture. No vertebral fracture or subluxation. Thoracic and
lower cervical spine degenerative changes. There is ossification of
the anterior longitudinal ligament with a fracture through the
anterior longitudinal ligament at the T6 level. No T6 vertebral
fracture is seen.

CT ABDOMEN PELVIS FINDINGS

Hepatobiliary: No focal liver abnormality is seen. No gallstones,
gallbladder wall thickening, or biliary dilatation.

Pancreas: Duodenal diverticulum in the head of the pancreas.
Otherwise, normal appearing pancreas.

Spleen: Normal in size without focal abnormality.

Adrenals/Urinary Tract: Elongated low-density enlargement of
portions of both adrenal glands. On coronal image number 100, this
measures 2.7 x 1.7 cm on the left. On coronal image number 105 this
measures 3.2 x 1.43 cm on the right. Small left renal cyst. Right
renal cortical scarring in the lower pole. Unremarkable ureters and
urinary bladder.

Stomach/Bowel: Small hiatal hernia. Unremarkable colon and small
bowel. Surgically absent appendix.

Vascular/Lymphatic: Atheromatous arterial calcifications without
aneurysm. No enlarged lymph nodes.

Reproductive: Small uterus with calcified fibroids. No adnexal mass.

Other: No abdominal wall hernia or abnormality. No abdominopelvic
ascites.

Musculoskeletal: L1, L3 and L4 vertebral compression deformities
with minimal bony retropulsion at the L4 level. No acute fracture
lines. Lumbar spine degenerative changes. Mild right hip
degenerative changes.
IMPRESSION: 1. Age indeterminate fracture through the ossified anterior
longitudinal ligament at the T6 level.
2. Probable old L1, L3 and L4 vertebral compression deformities.
3. Small hiatal hernia.
4. Densely calcified coronary artery and aortic atherosclerosis.
5. Bilateral adrenal adenomas or focal hyperplasia.

## 2019-03-22 IMAGING — DX DG CHEST 1V
1 series · 1 of 1 positions shown · non-contrast
Comparison: 06/15/2017 and prior radiographs

CLINICAL DATA: Fall with elbow fracture.

EXAM:
CHEST 1 VIEW

[chest ap]
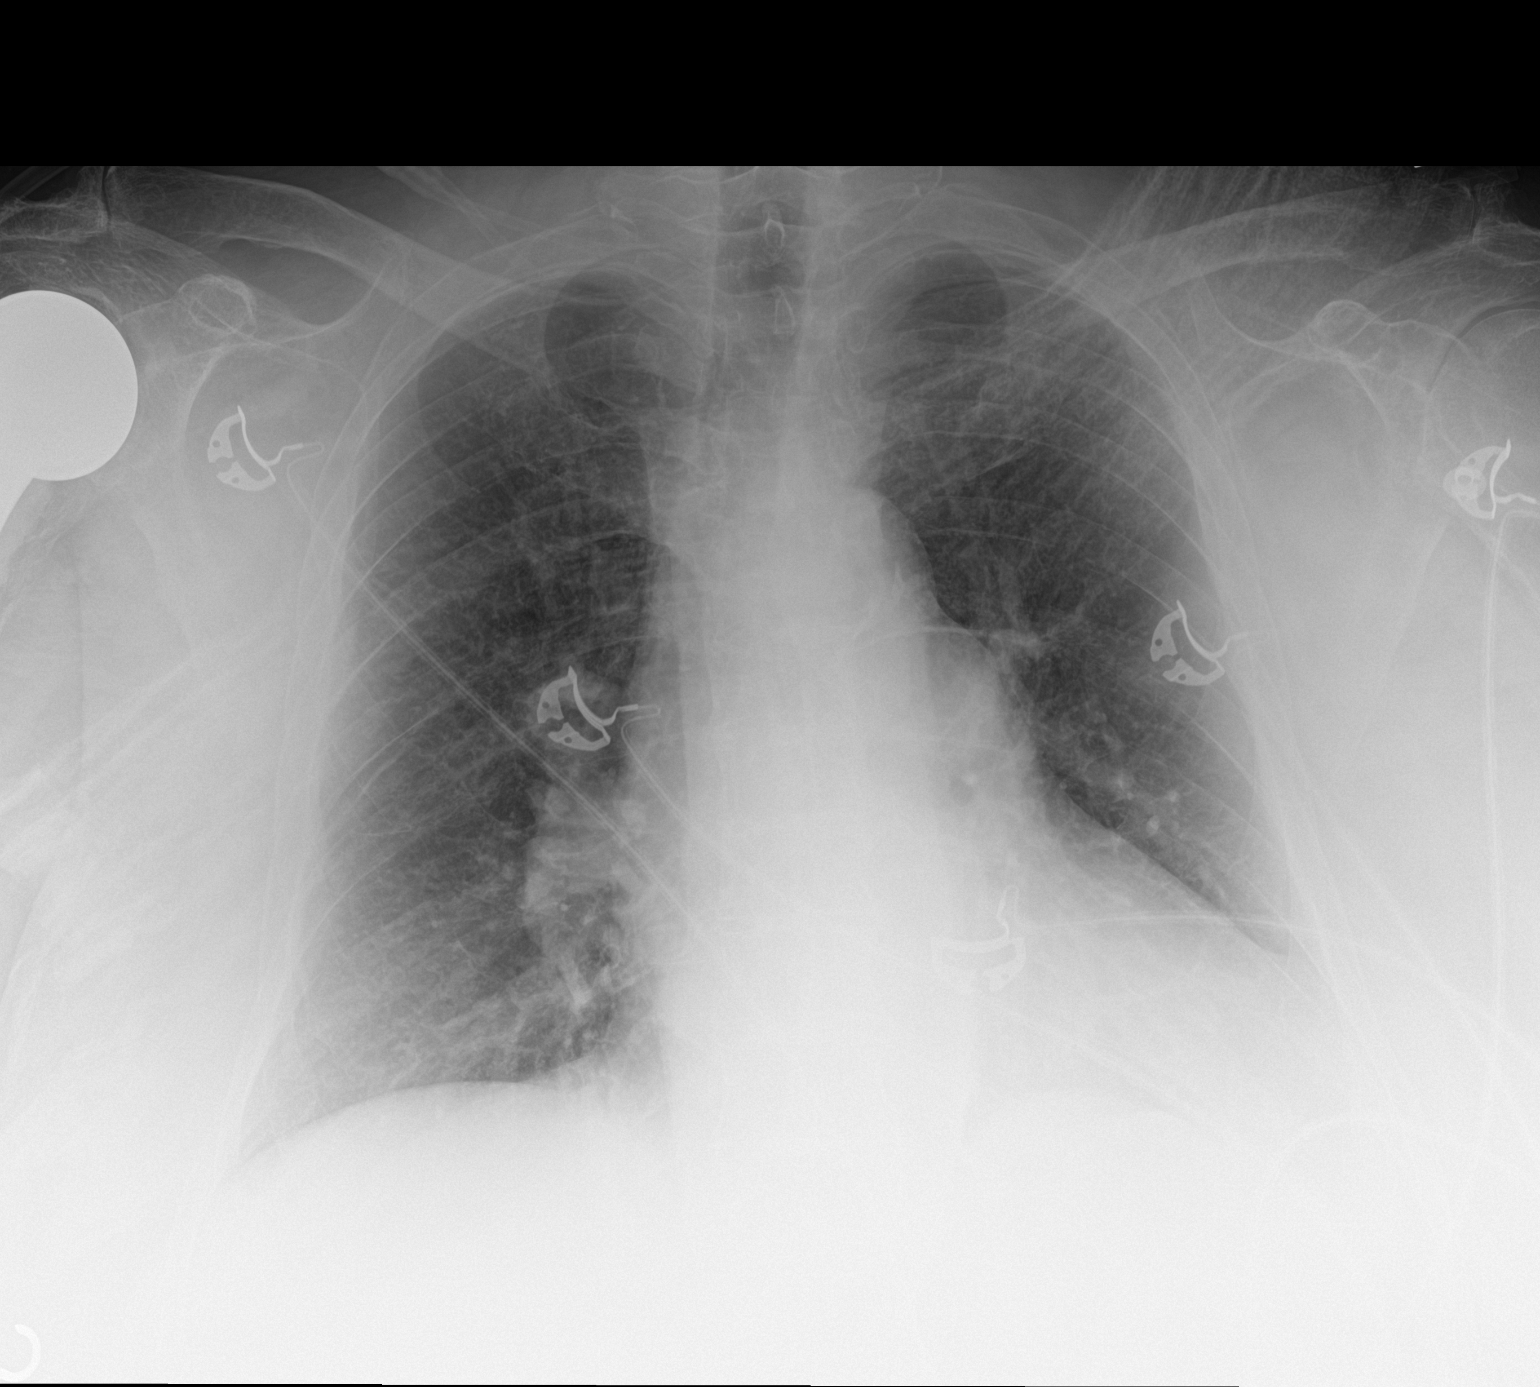

[1 of 1 positions shown; findings below may reference images not displayed]

FINDINGS: Cardiomegaly noted.

There is no evidence of focal airspace disease, pulmonary edema,
suspicious pulmonary nodule/mass, pleural effusion, or pneumothorax.

No acute bony abnormalities are identified. Right shoulder
arthroplasty changes again noted.
IMPRESSION: Cardiomegaly without evidence of acute cardiopulmonary disease.

## 2019-03-22 IMAGING — CT CT CERVICAL SPINE W/O CM
4 of 7 series · 15 of 33 positions shown, 16 images · non-contrast
Comparison: 04/28/2015 and prior CTs

CLINICAL DATA: 76-year-old female with acute fall and altered level
of consciousness.

EXAM:
CT HEAD WITHOUT CONTRAST
CT CERVICAL SPINE WITHOUT CONTRAST
TECHNIQUE: Multidetector CT imaging of the head and cervical spine was
performed following the standard protocol without intravenous
contrast. Multiplanar CT image reconstructions of the cervical spine
were also generated.

[Series 4: coronal soft tissue · coronal · 0.29mm/px · 3 of 60 slices shown]
[im 15/60  bone]
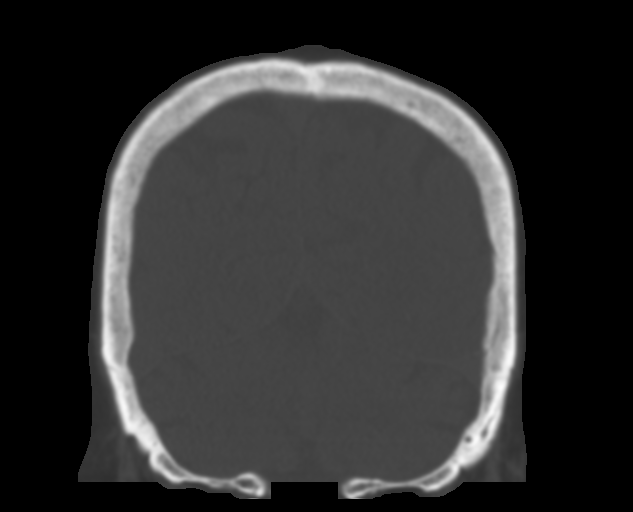
[im 30/60  bone]
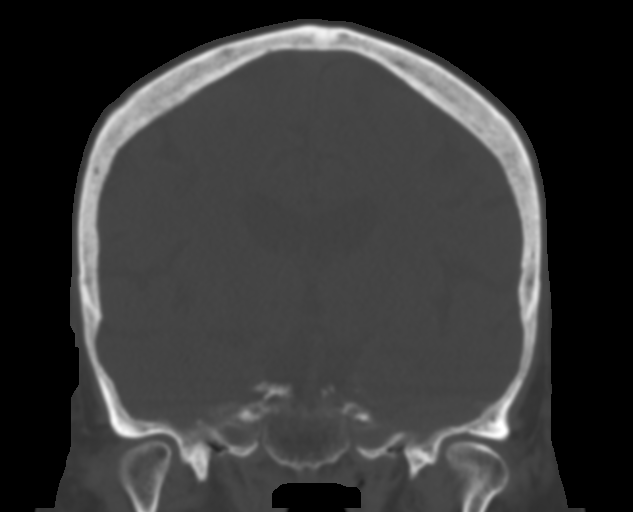
[im 45/60  bone]
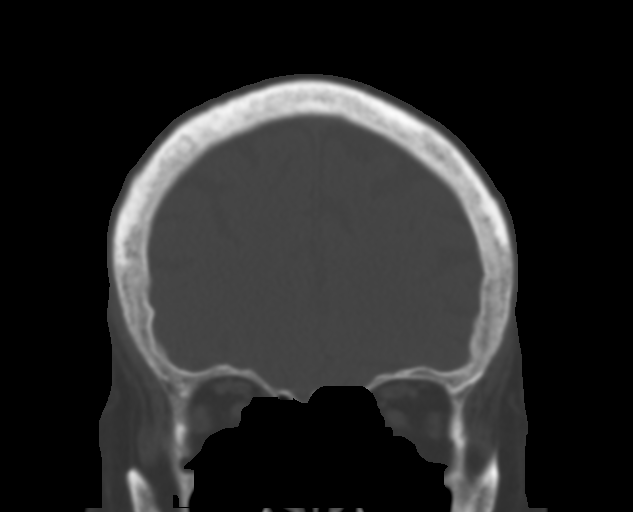

[Series 7: c spine soft · axial · 0.35mm/px · z∈[-270,-204]mm · 3 of 84 slices shown]
[im 17/84  soft-tissue]
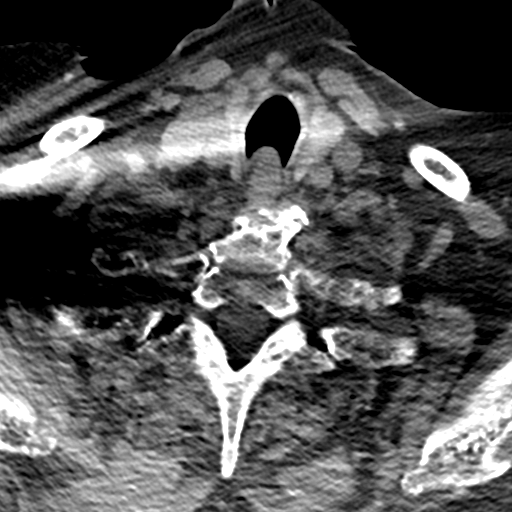
[im 34/84  soft-tissue]
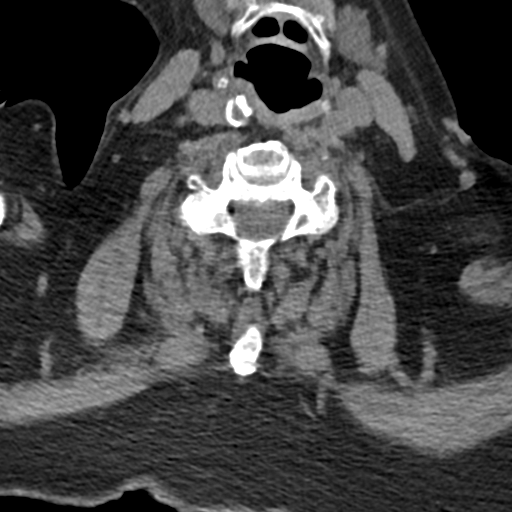
[im 50/84  soft-tissue]
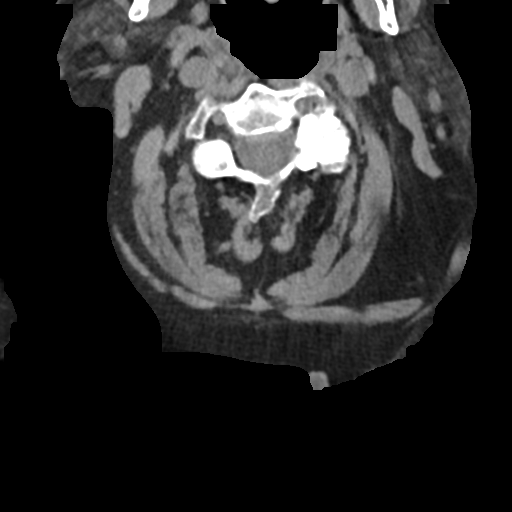

[Series 8: sagittal bone · sagittal · 0.24mm/px · 5 of 61 slices shown]
[im 11/61  bone]
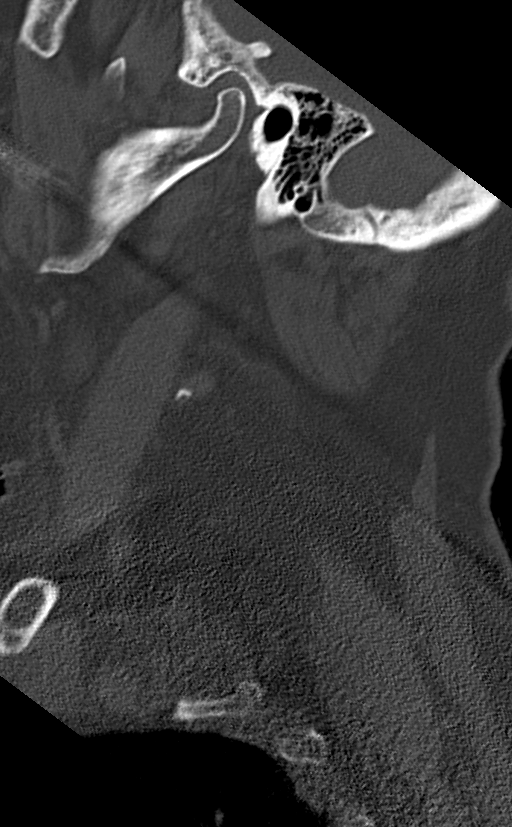
[im 21/61  bone]
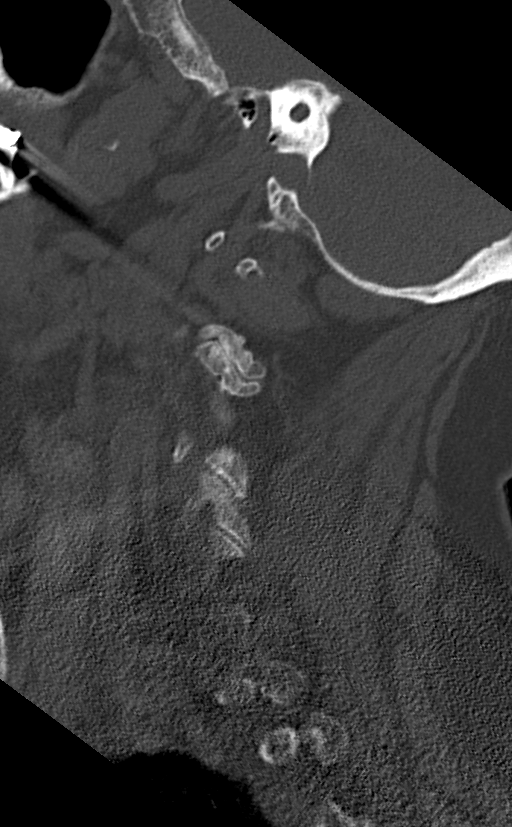
[im 31/61  bone]
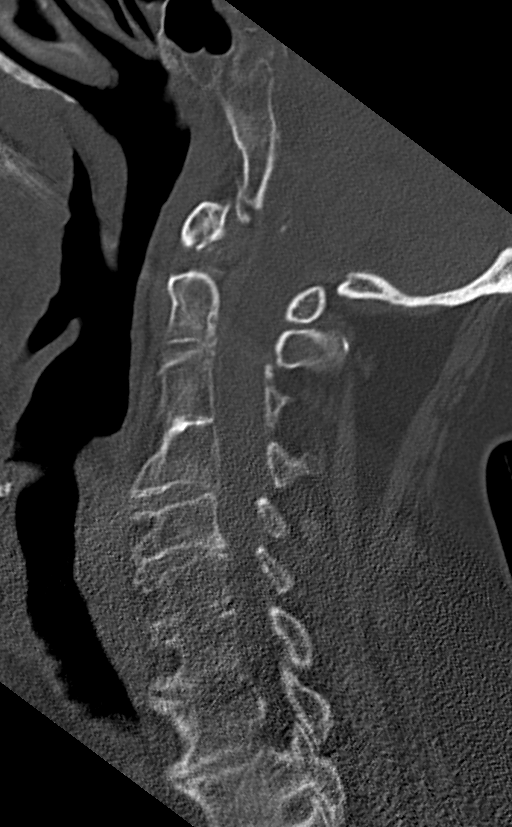
[im 41/61  bone]
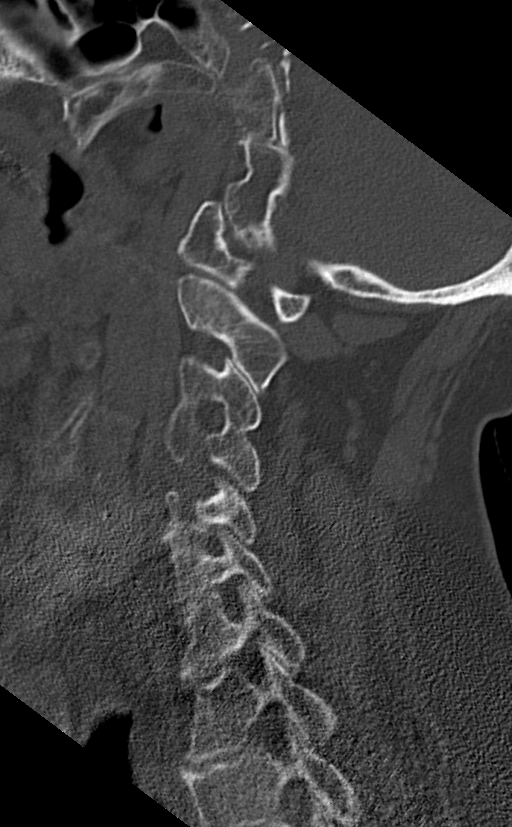
[im 51/61  bone]
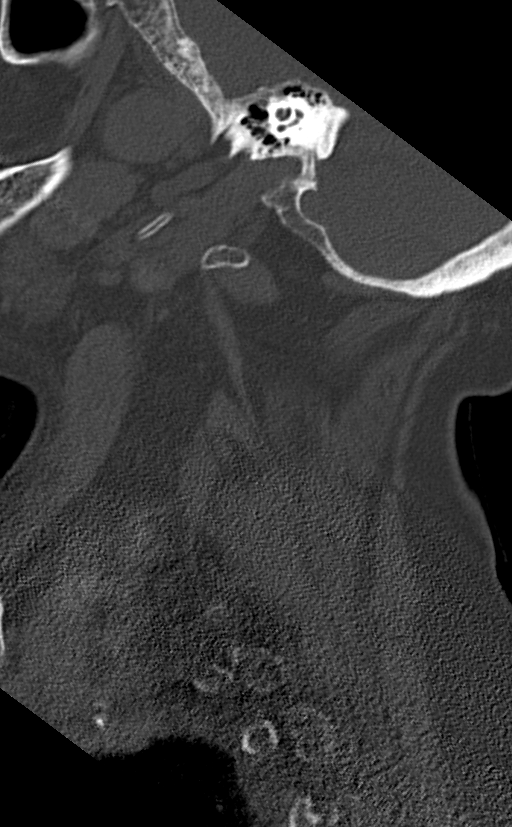

[Series 10: orthogonal bone · axial · 0.23mm/px · z∈[-287,-201]mm · 4 of 86 slices shown, 5 images]
[im 18/86  soft-tissue]
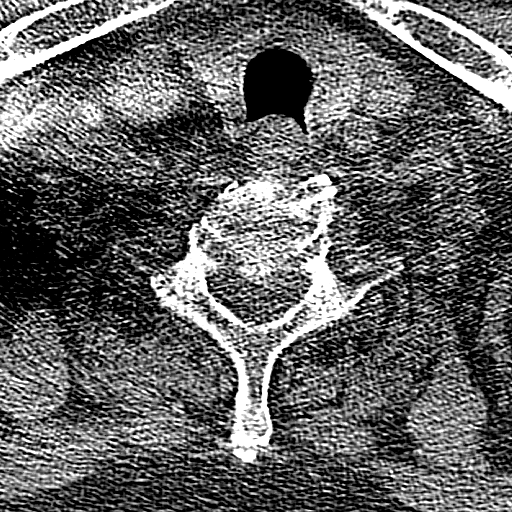
[im 18/86  bone]
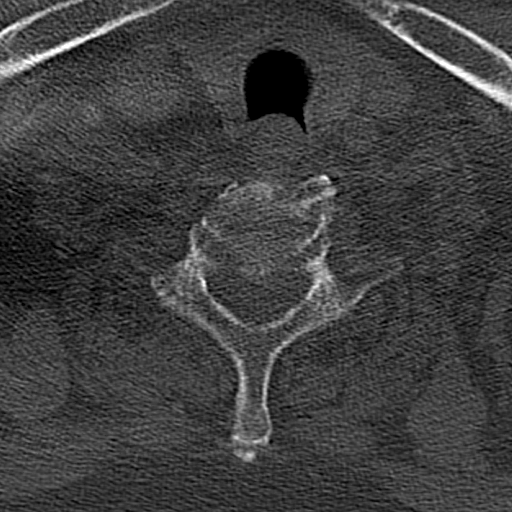
[im 35/86  bone]
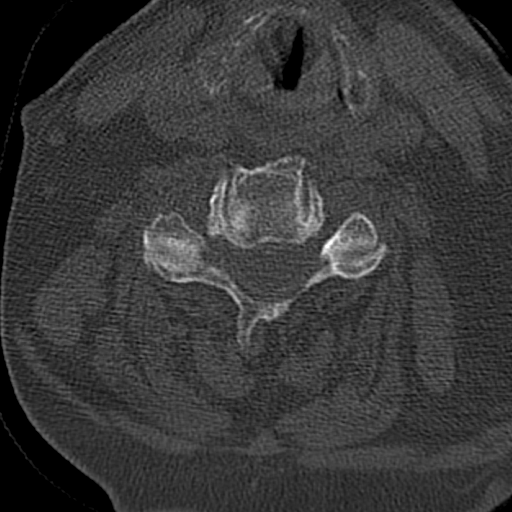
[im 52/86  bone]
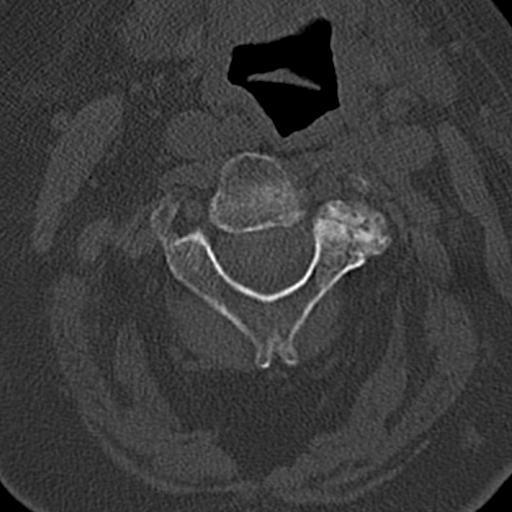
[im 69/86  bone]
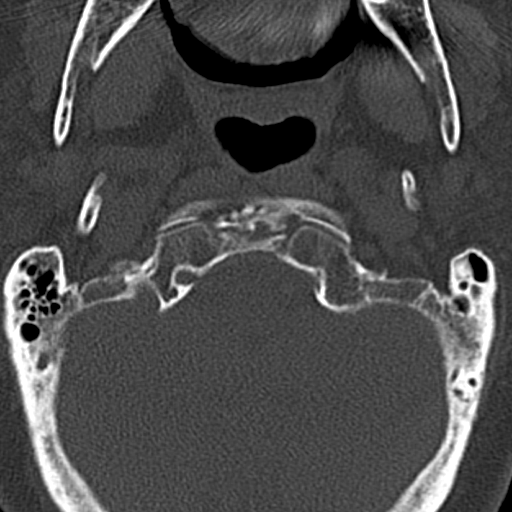

[15 of 33 positions shown; findings below may reference images not displayed]

FINDINGS: CT HEAD FINDINGS

Brain: No evidence of acute infarction, hemorrhage, hydrocephalus,
extra-axial collection or mass lesion/mass effect.

Mild chronic small-vessel white matter ischemic changes again noted.

Vascular: Intracranial atherosclerotic calcifications noted

Skull: Normal. Negative for fracture or focal lesion.

Sinuses/Orbits: No acute finding.

Other: None.

CT CERVICAL SPINE FINDINGS

Alignment: Normal.

Skull base and vertebrae: No acute fracture. No primary bone lesion
or focal pathologic process.

Soft tissues and spinal canal: No prevertebral fluid or swelling. No
visible canal hematoma.

Disc levels: Mild multilevel degenerative disc disease, spondylosis
and facet arthropathy noted. C3-4 bony fusion noted.

Upper chest: No acute abnormality

Other: None
IMPRESSION: No evidence of acute intracranial abnormality. Mild chronic
small-vessel white matter ischemic changes

No static evidence of acute injury to the cervical spine.

## 2019-03-22 IMAGING — DX DG HUMERUS 2V *L*
2 series · 2 of 2 positions shown · non-contrast
Comparison: 06/05/2015 radiographs

CLINICAL DATA: Acute left arm pain following fall today. Initial
encounter.

EXAM:
LEFT HUMERUS - 2+ VIEW

[humerus ap]
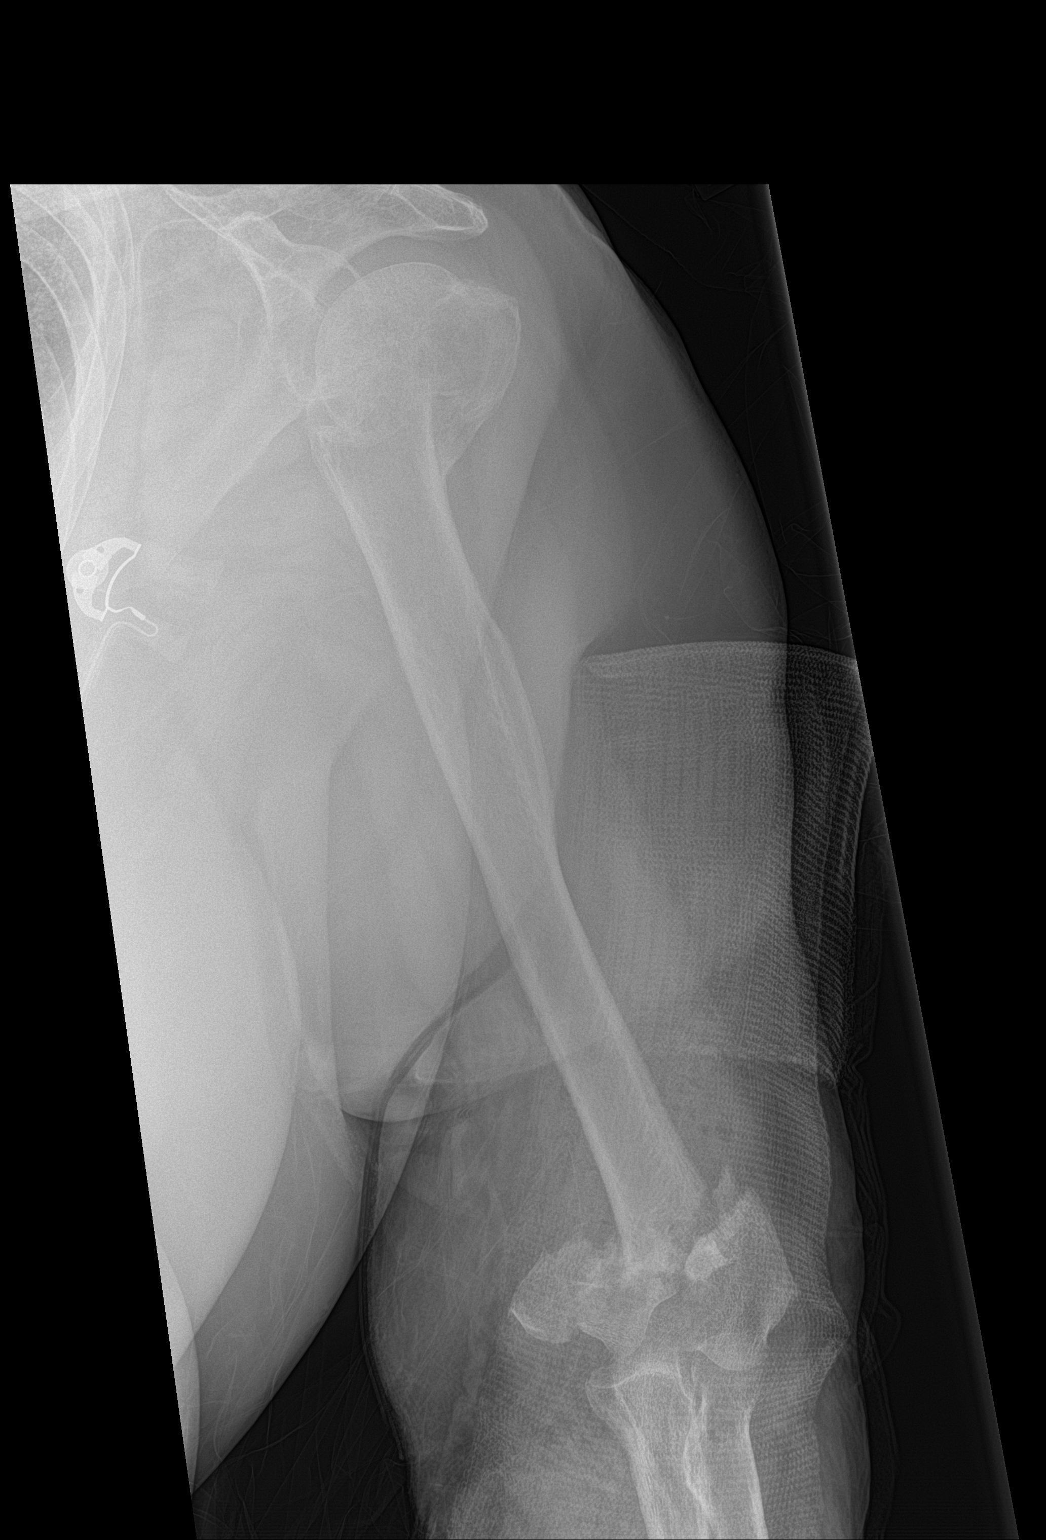

[humerus lat]
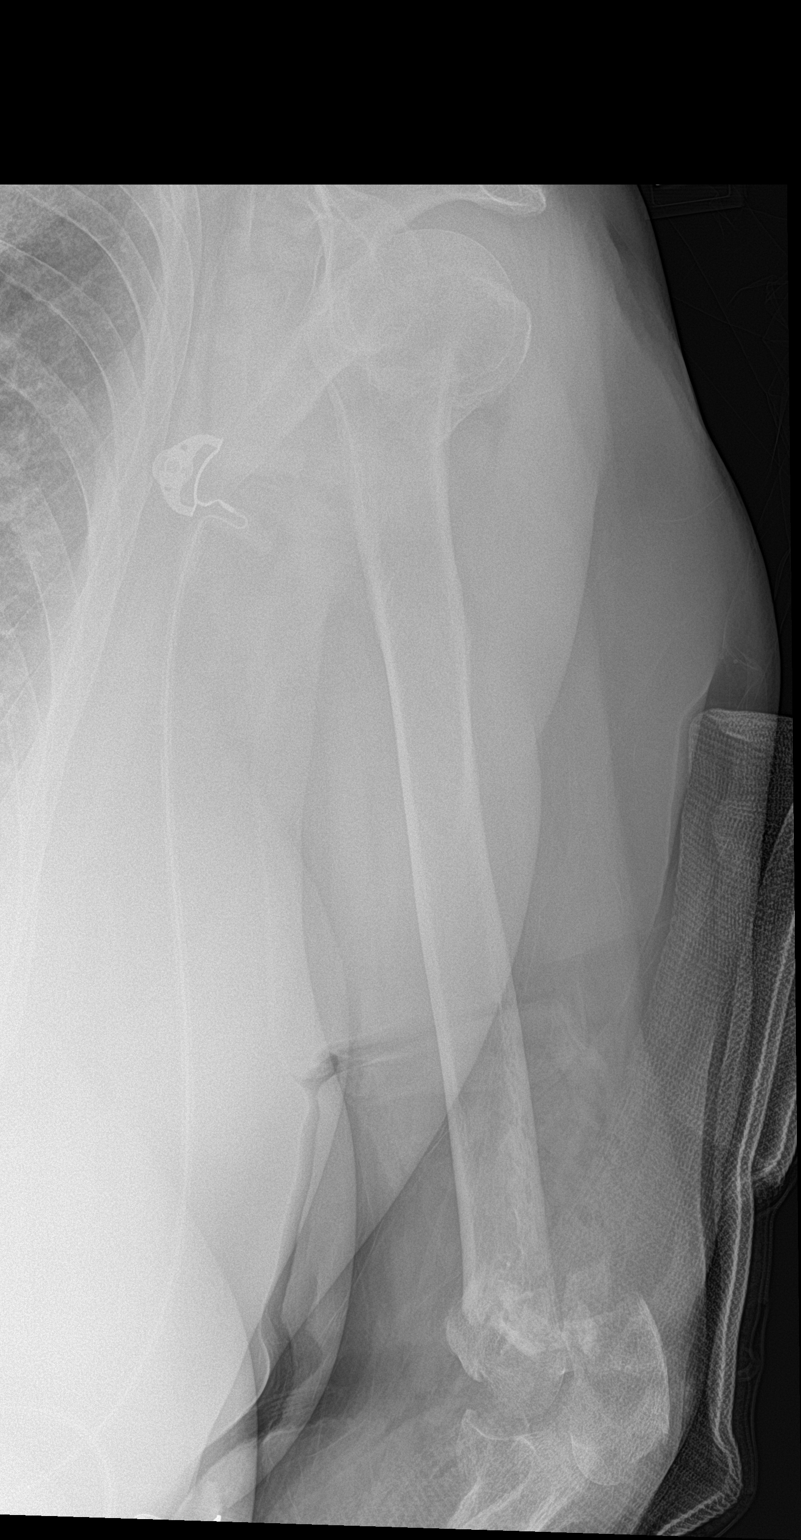

[2 of 2 positions shown; findings below may reference images not displayed]

FINDINGS: A comminuted displaced intraarticular fracture of the distal humerus
is identified with approximately 1 shaft width dorsal displacement.

There appears to be subluxation at the humeral ulnar joint.

A remote fracture of the left humeral neck is noted.
IMPRESSION: Comminuted displaced intraarticular fracture the distal humerus with
probable disruption of the humeral ulnar joint.

## 2019-03-22 IMAGING — DX DG FOREARM 2V*L*
2 series · 2 of 2 positions shown · non-contrast
Comparison: None.

CLINICAL DATA: Acute forearm pain following fall. Initial
encounter.

EXAM:
LEFT FOREARM - 2 VIEW

[forearm ap]
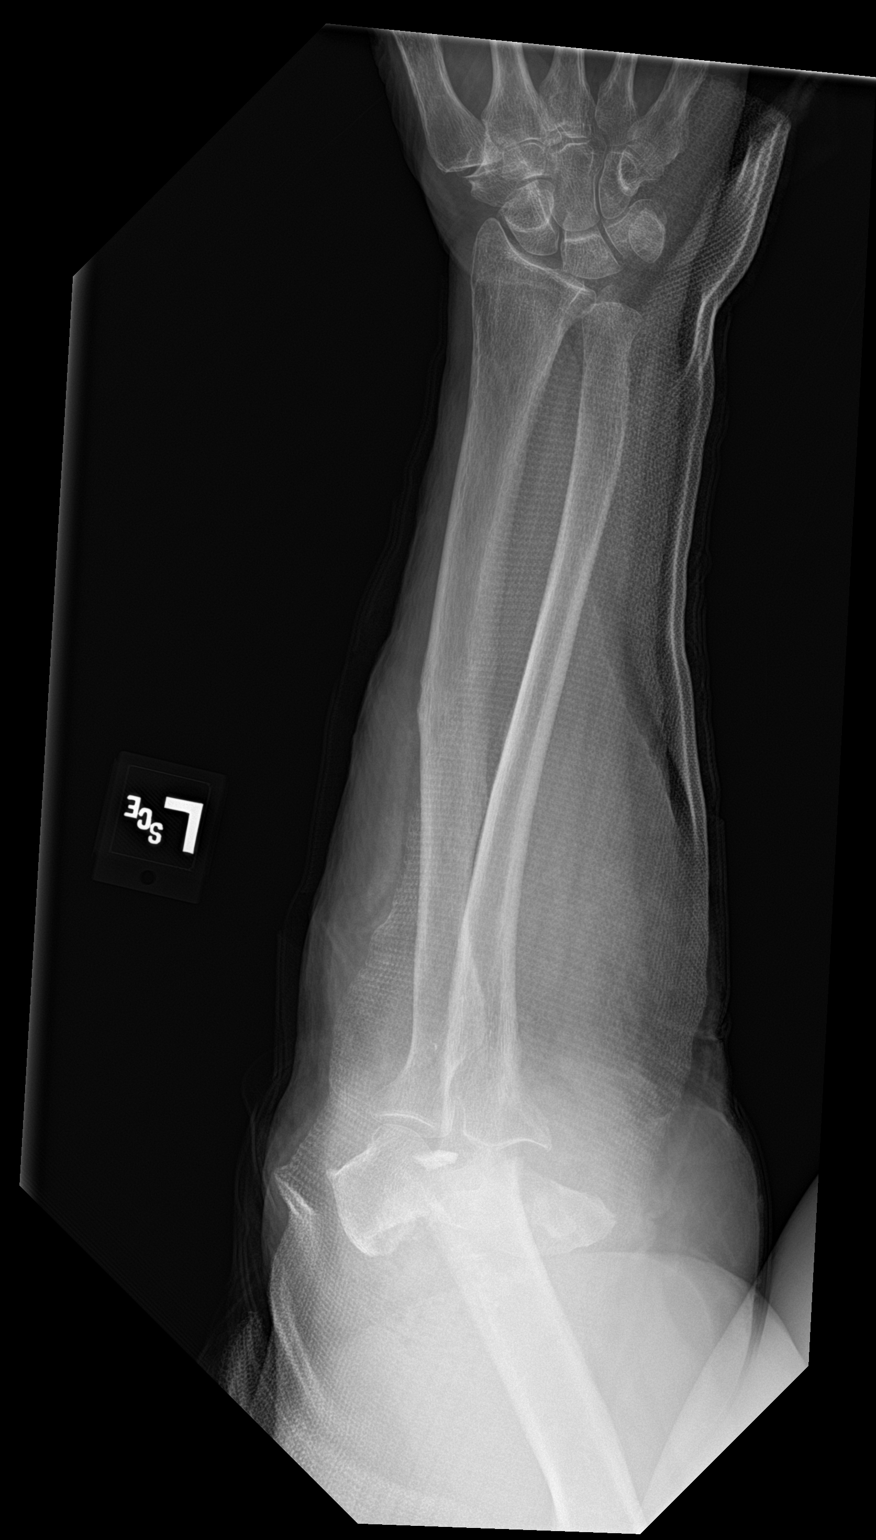

[forearm lat]
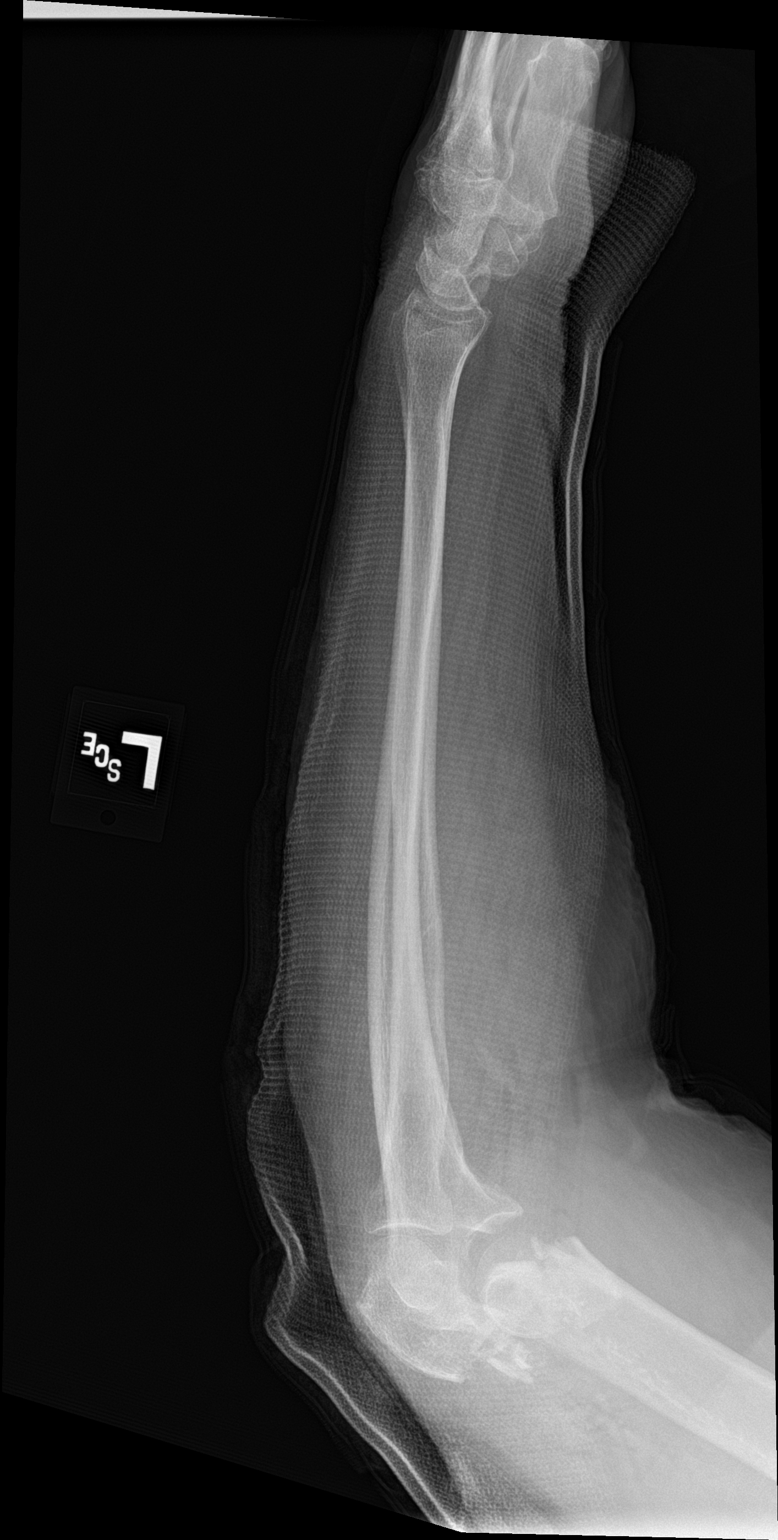

[2 of 2 positions shown; findings below may reference images not displayed]

FINDINGS: A comminuted displaced intra-articular distal humeral fracture is
identified with 2 cm dorsal displacement of the lateral humeral
fragment.

There appears to be subluxation at the humeral ulnar joint.

No other acute abnormalities are identified.
IMPRESSION: Comminuted displaced intra-articular distal humeral fracture with
probable subluxation at the humeral ulnar joint.

## 2019-03-29 IMAGING — DX DG ELBOW COMPLETE 3+V*L*
4 series · 4 of 4 positions shown · non-contrast
Comparison: 08/12/2017

CLINICAL DATA: Patient fell with fracture dislocation of the left
elbow on 08/12/2017. Now with worsening pain after repair.

EXAM:
LEFT ELBOW - COMPLETE 3+ VIEW

[elbow ap]
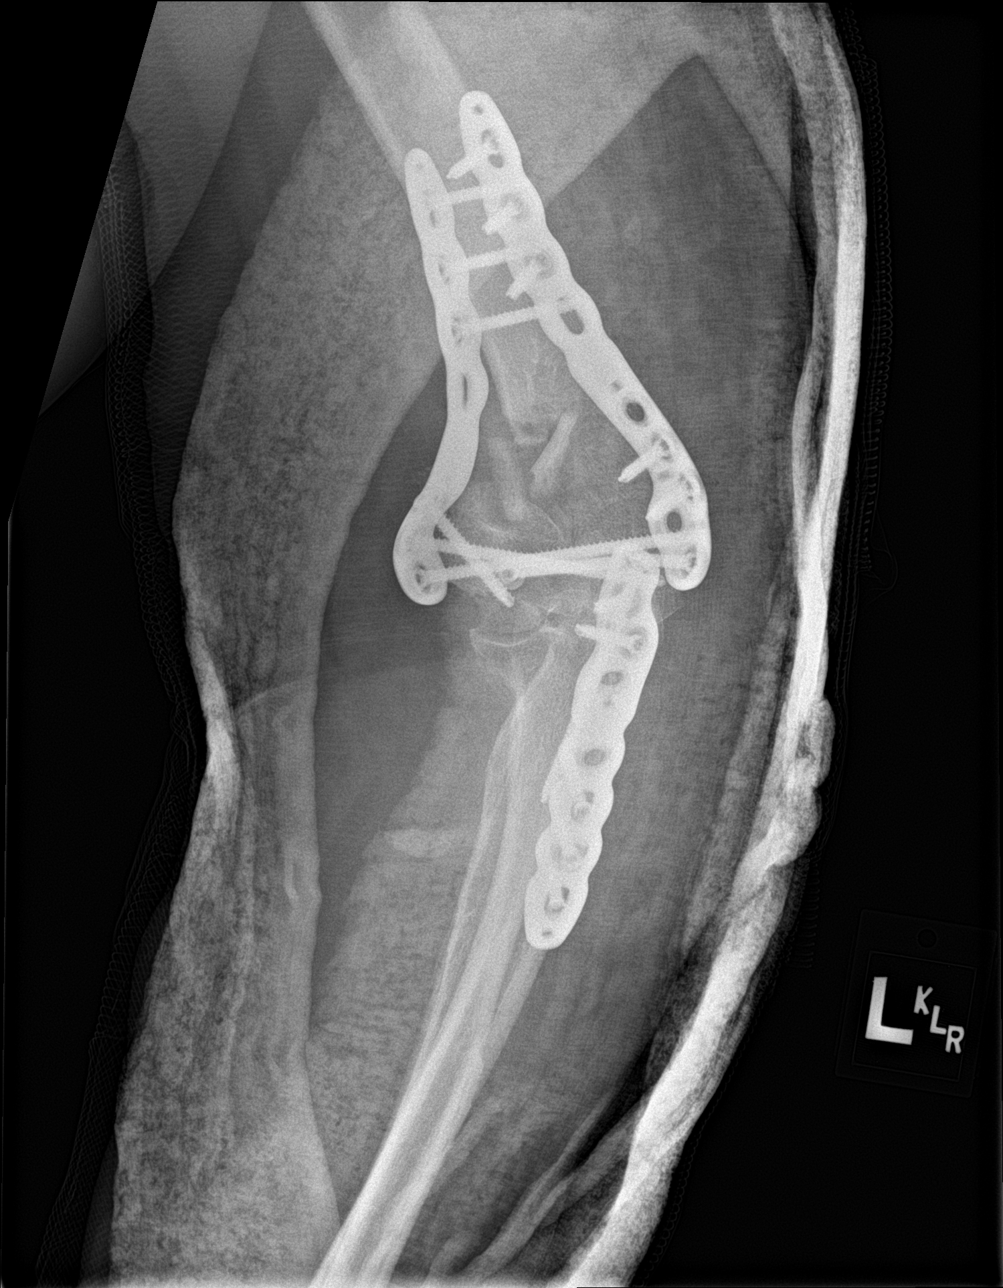

[elbow obl (1 of 2)]
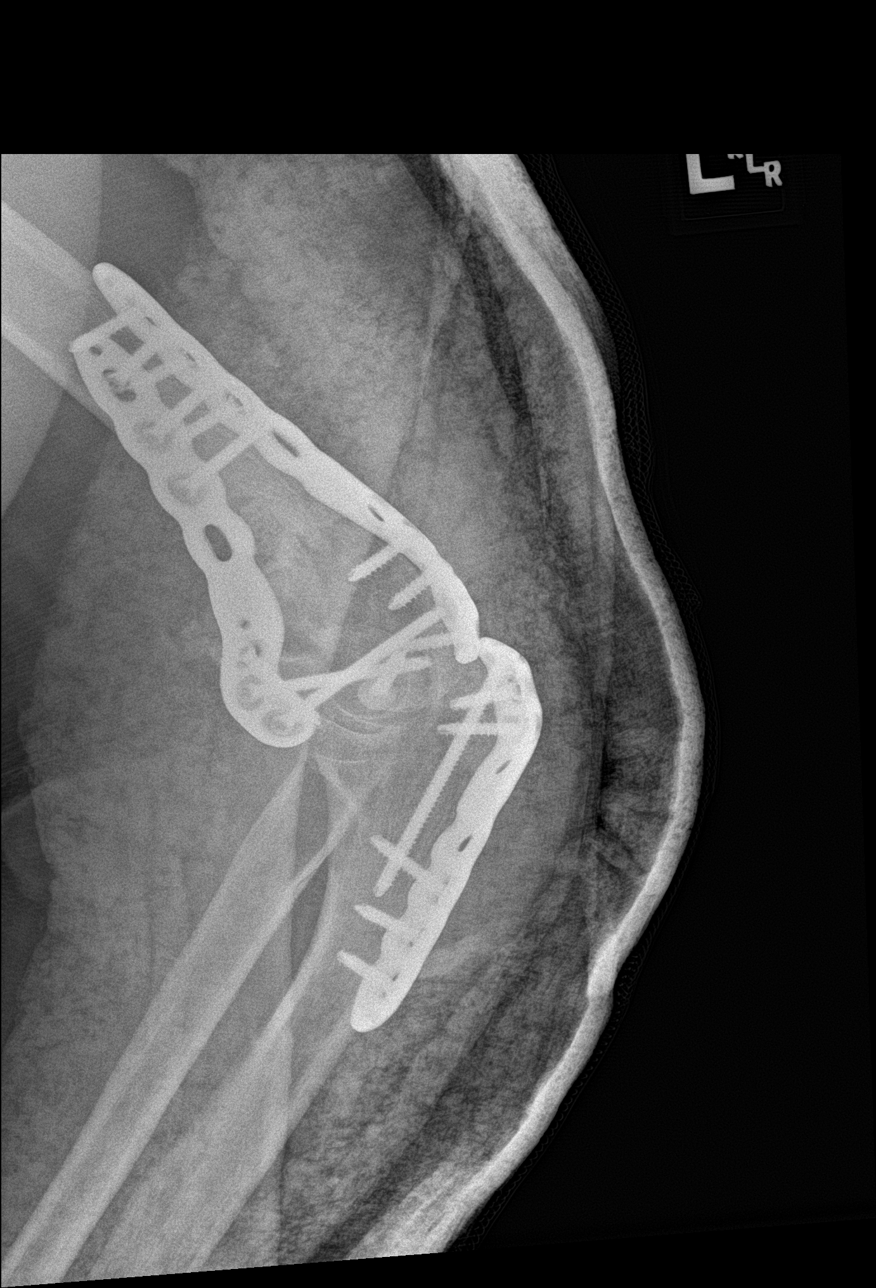

[elbow obl (2 of 2)]
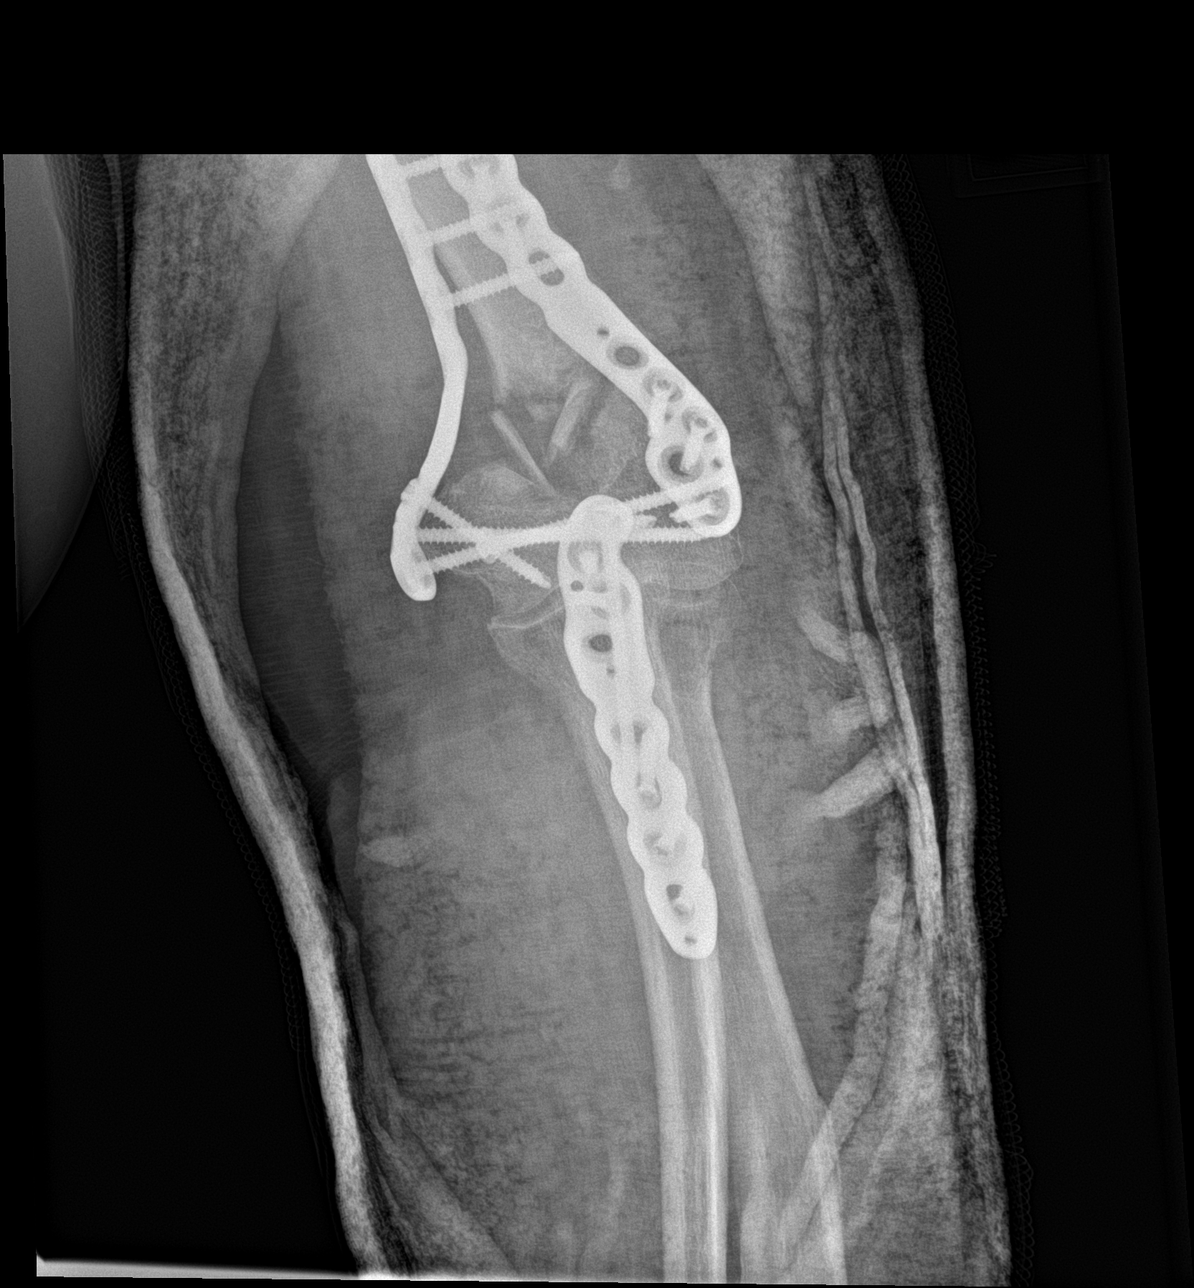

[elbow lat]
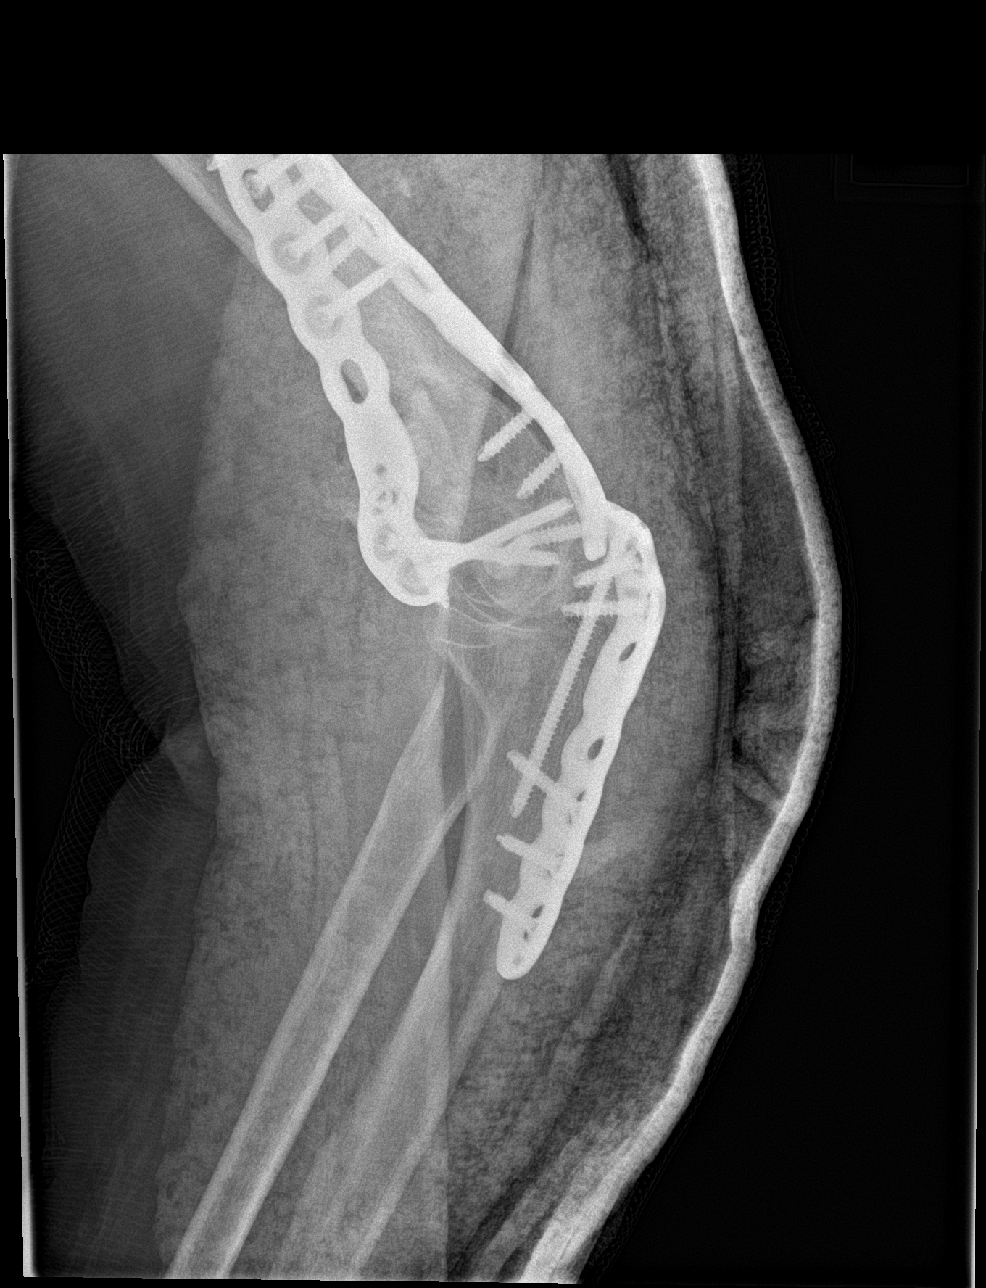

[4 of 4 positions shown; findings below may reference images not displayed]

FINDINGS: Cast material is present which obscures bone detail. There has been
interval plate and screw fixation of fractures of the distal humerus
and proximal ulna. Fracture lines remain visible. Fragmentation of
the supracondylar region of the humerus. Fracture fragments appear
to be in near anatomic position. There is elevation of the distal
aspect of the medial humeral plate. Screws appear flush with the
plates. No prior postoperative views are available for comparison.
IMPRESSION: Postoperative plate and screw fixation of the distal humerus and
proximal ulna with cast placement. Fracture lines remain visible
although the bones appear to be in near anatomic position.
Correlation with any prior postoperative views would be useful to
look for any interval change.

## 2019-03-29 IMAGING — DX DG CHEST 1V PORT
1 series · 1 of 1 positions shown · non-contrast
Comparison: Radiograph and CT 08/12/2017

CLINICAL DATA: Productive cough.

EXAM:
PORTABLE CHEST 1 VIEW

[chest ap]
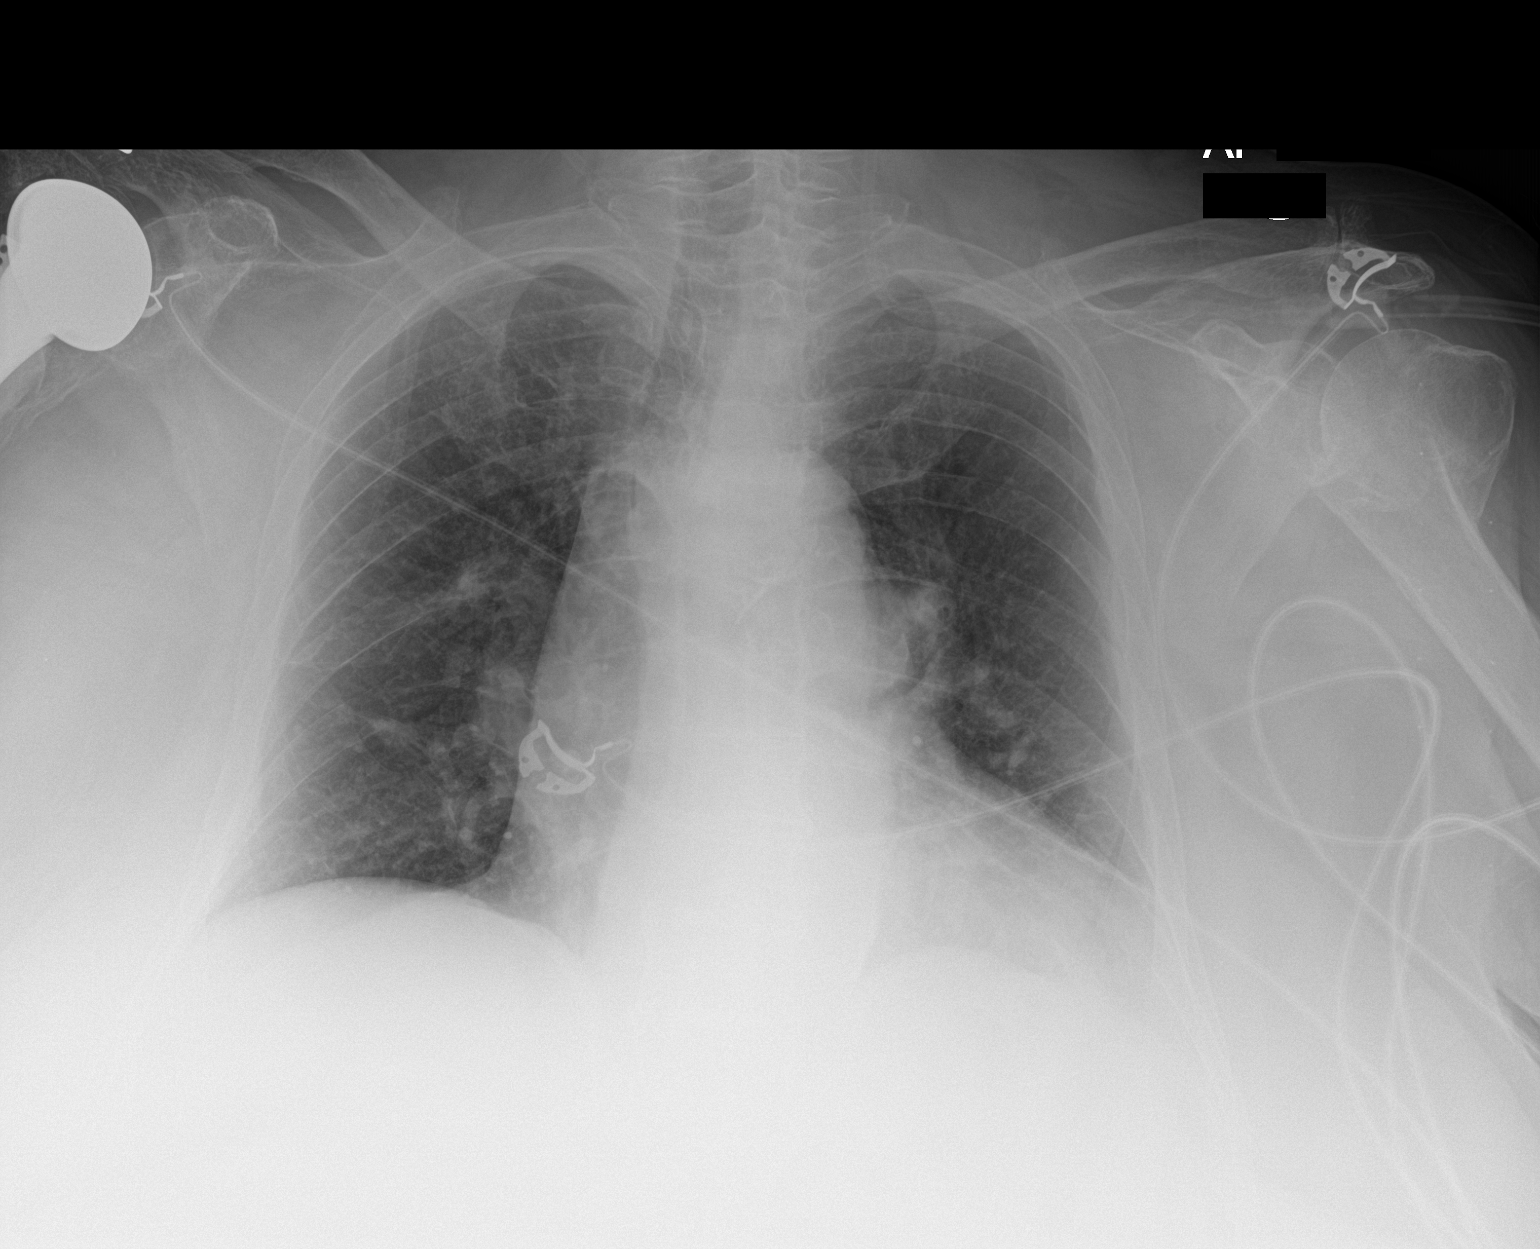

[1 of 1 positions shown; findings below may reference images not displayed]

FINDINGS: Unchanged heart size and mediastinal contours with stable
cardiomegaly atherosclerosis. No pulmonary edema, confluent
consolidation, large pleural effusion or pneumothorax. Remote left
proximal humerus fracture and right humeral prosthesis partially
included.
IMPRESSION: Stable cardiomegaly without acute abnormality.

## 2019-04-14 IMAGING — CT CT ABD-PELV W/ CM
2 of 5 series · 16 of 46 positions shown, 18 images · IV contrast (iopamidol)
Comparison: 08/12/2017

CLINICAL DATA: Abdominal pain. Recent diagnosis of urinary tract
infection. Unable to urinate since 6 o'clock this morning.

EXAM:
CT ABDOMEN AND PELVIS WITH CONTRAST
TECHNIQUE: Multidetector CT imaging of the abdomen and pelvis was performed
using the standard protocol following bolus administration of
intravenous contrast.
CONTRAST:  100mL [id number] IOPAMIDOL ([id number]-2YY) INJECTION 61%

[Series 2: routine abd/pel with · axial · 0.91mm/px · z∈[-1057,-662]mm · 13 of 89 slices shown, 15 images]
[im 5/89  soft-tissue]
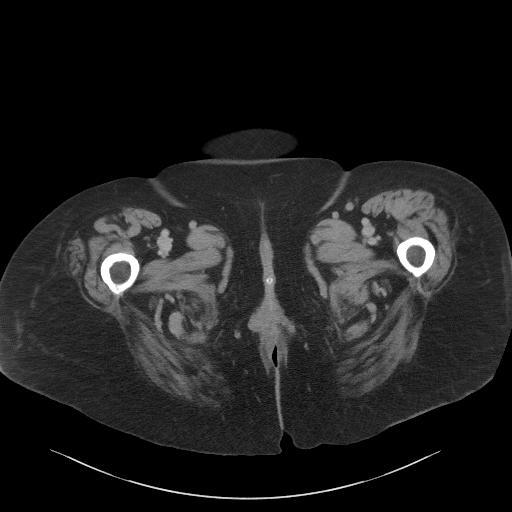
[im 5/89  bone]
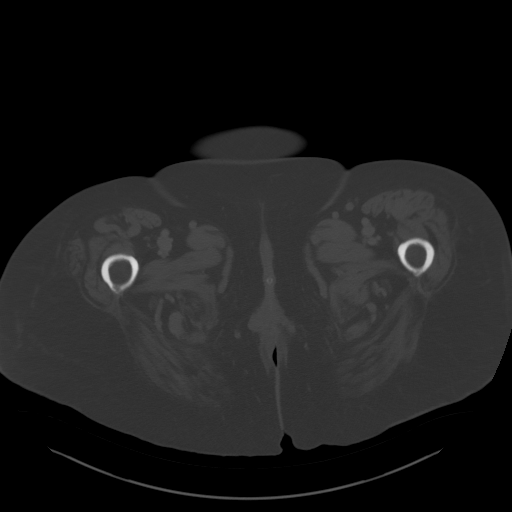
[im 14/89  soft-tissue]
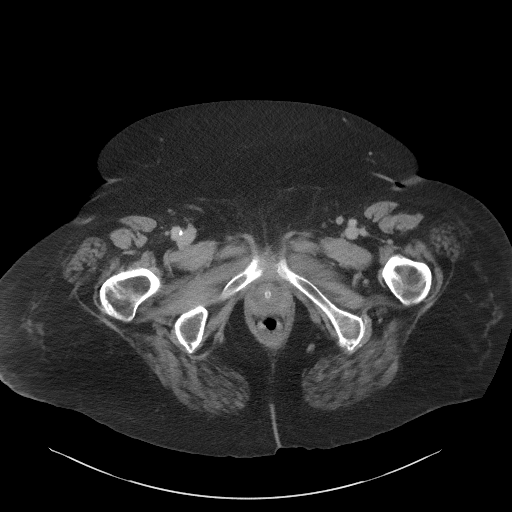
[im 18/89  soft-tissue]
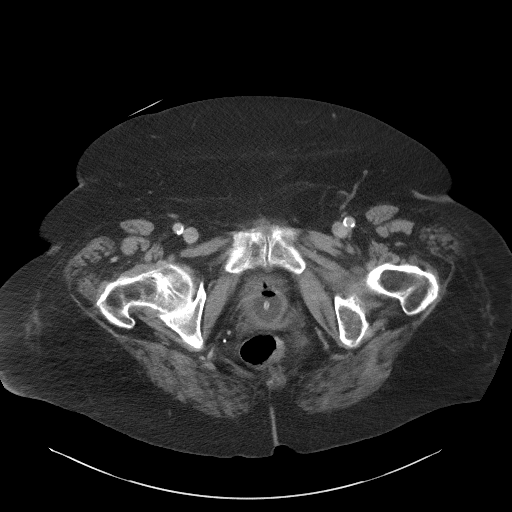
[im 27/89  soft-tissue]
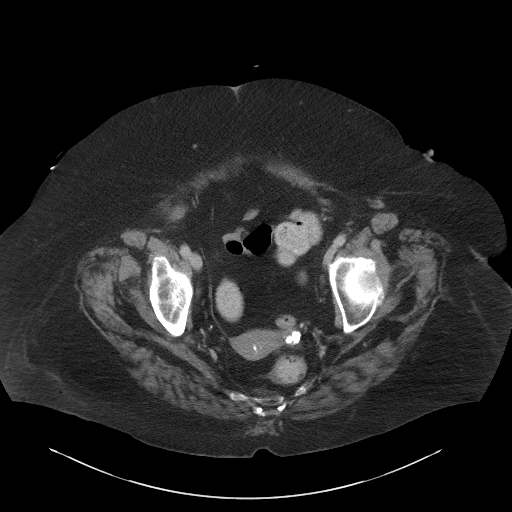
[im 31/89  soft-tissue]
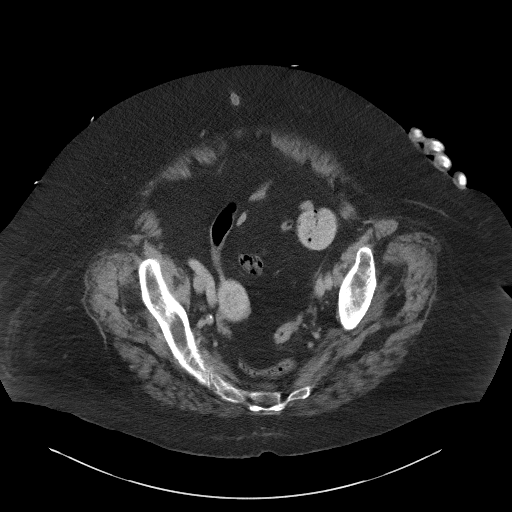
[im 40/89  soft-tissue]
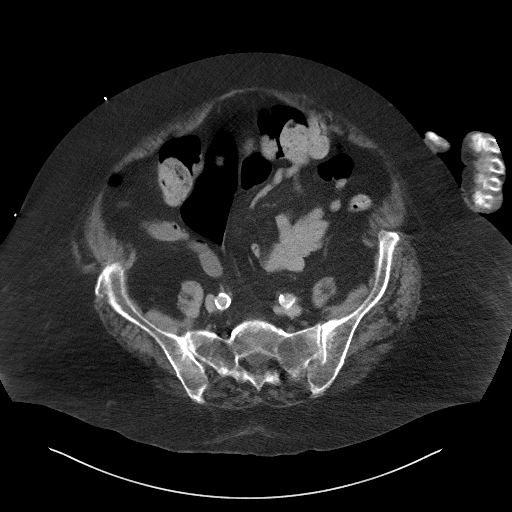
[im 45/89  soft-tissue]
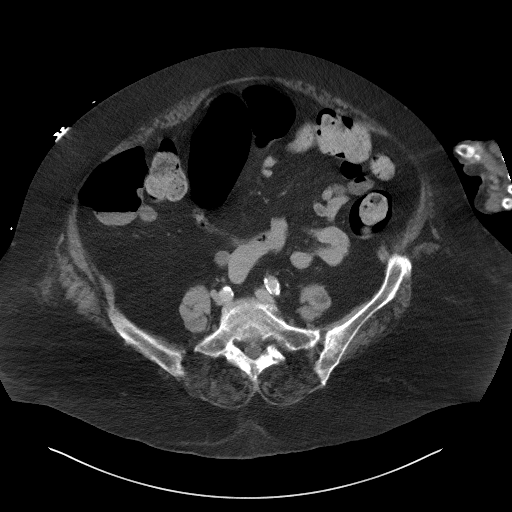
[im 49/89  soft-tissue]
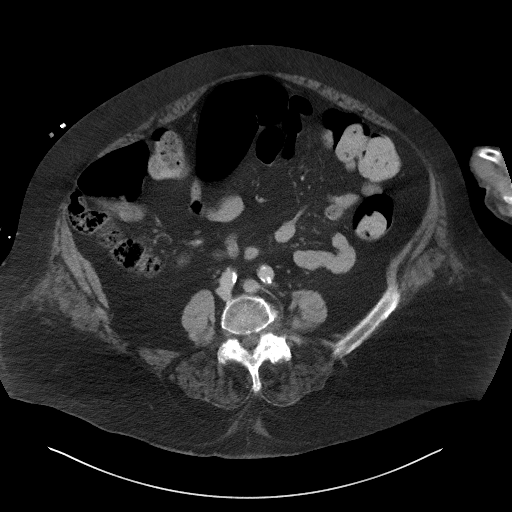
[im 58/89  soft-tissue]
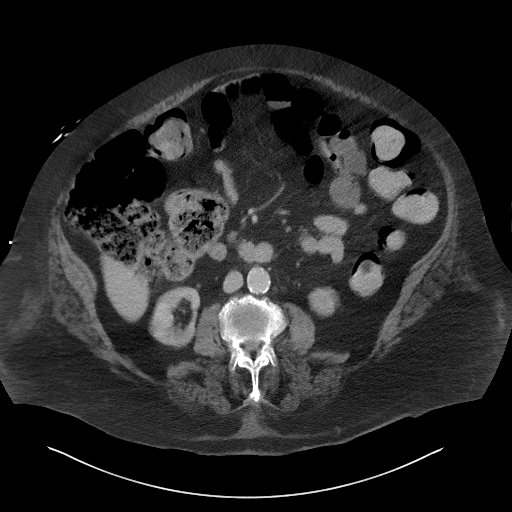
[im 58/89  bone]
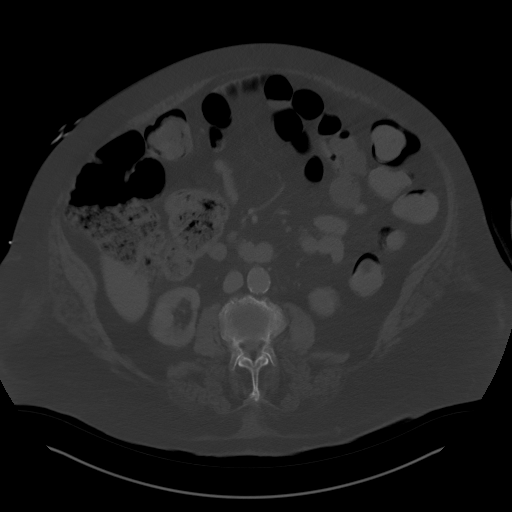
[im 62/89  soft-tissue]
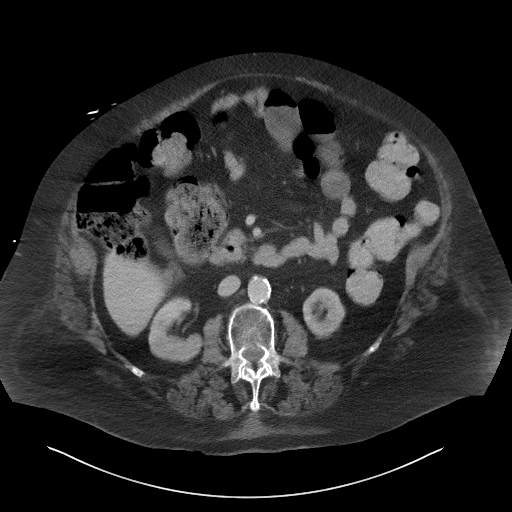
[im 71/89  soft-tissue]
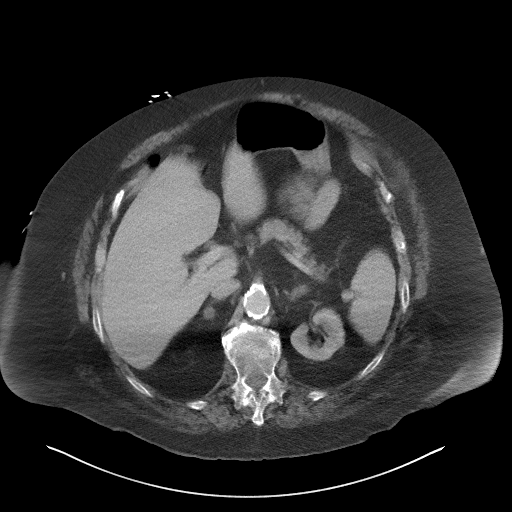
[im 75/89  soft-tissue]
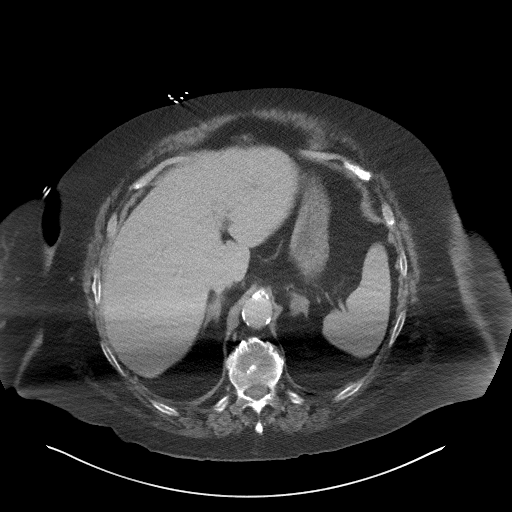
[im 84/89  soft-tissue]
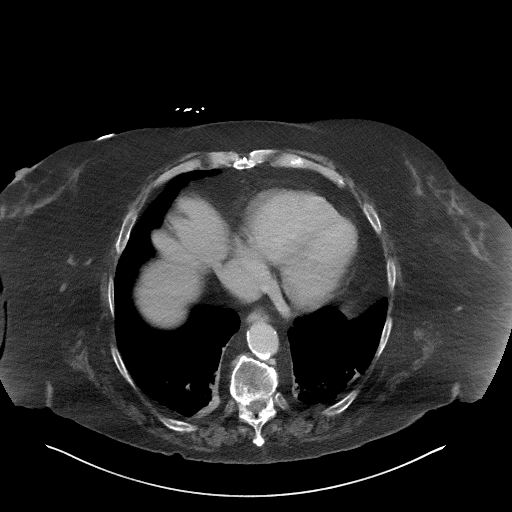

[Series 5: coronal st · coronal · 0.84mm/px · 3 of 122 slices shown]
[im 41/122  soft-tissue]
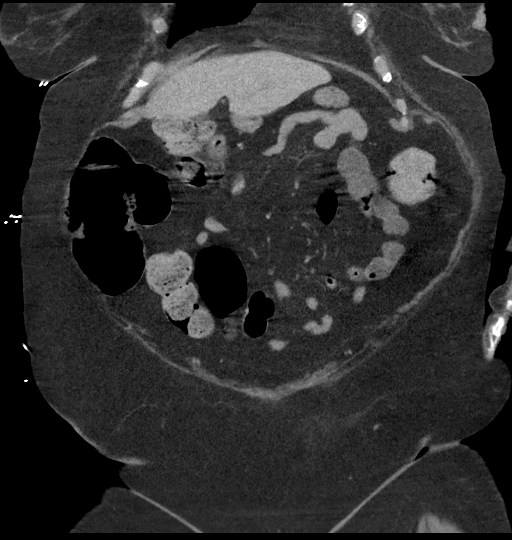
[im 54/122  soft-tissue]
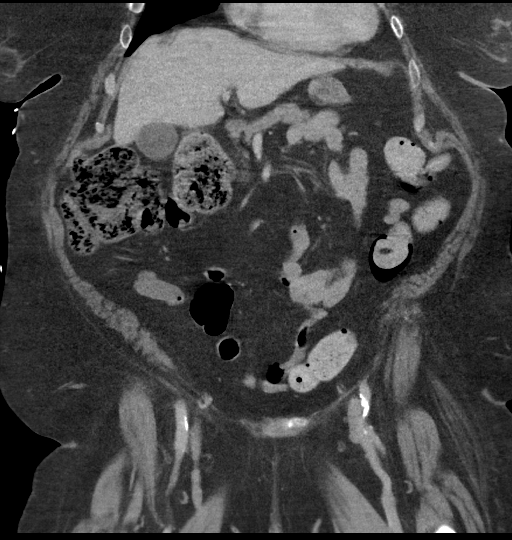
[im 68/122  soft-tissue]
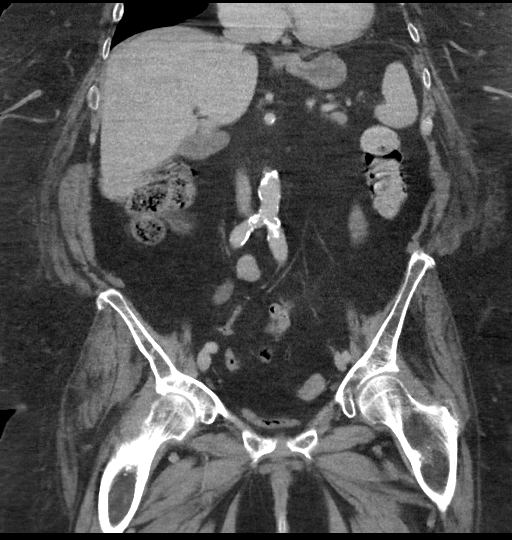

[16 of 46 positions shown; findings below may reference images not displayed]

FINDINGS: Lower chest: Minimal bilateral pleural effusions with basilar
atelectasis. Prominent retrocrural fat.

Hepatobiliary: No focal liver abnormality is seen. No gallstones,
gallbladder wall thickening, or biliary dilatation.

Pancreas: Unremarkable. No pancreatic ductal dilatation or
surrounding inflammatory changes.

Spleen: Normal in size without focal abnormality.

Adrenals/Urinary Tract: 17 mm left adrenal gland nodule unchanged
since previous study. Subcentimeter right adrenal gland nodule is
also unchanged. Kidneys are atrophic. No hydronephrosis or
hydroureter. Bladder is decompressed with a Foley catheter.

Stomach/Bowel: Stomach is not abnormally distended. There is
suggestion of wall thickening around the proximal duodenum which
could represent duodenitis or duodenal ulcer. Duodenal mass not
entirely excluded but no proximal obstruction is seen. Small bowel
are mostly decompressed. Scattered stool in the colon. No colonic
distention or wall thickening. Appendix is not identified.

Vascular/Lymphatic: Aortic atherosclerosis. No enlarged abdominal or
pelvic lymph nodes. Calcification demonstrated in the superior
mesenteric artery may indicate an area of stenosis. The vessel
remains patent.

Reproductive: Calcified uterine fibroids. Uterus and ovaries are not
enlarged.

Other: No free air or free fluid in the abdomen. Abdominal wall
musculature appears intact with fatty atrophy diffusely.

Musculoskeletal: Degenerative changes in the spine. Diffuse
demineralization. Multiple lumbar vertebral compression fractures.
Changes likely represent osteoporosis.
IMPRESSION: 1. Small bilateral pleural effusions with basilar atelectasis.
2. Bilateral adrenal gland nodules unchanged since previous study.
Possible adrenal hyperplasia.
3. No evidence of bowel obstruction. Suggestion of wall thickening
around the proximal duodenum this may represent duodenitis or ulcer
disease. Can't entirely exclude a mass although the lack of proximal
obstruction would argue against that.
4. Aortic atherosclerosis.
5. Calcified uterine fibroids.

## 2019-04-21 IMAGING — MR MR LUMBAR SPINE W/O CM
8 of 11 series · 36 of 48 positions shown · non-contrast
Comparison: Prior CT from 09/04/2017.

CLINICAL DATA: Initial evaluation for acute mid back/ thoracic
spine pain.

EXAM:
MRI THORACIC AND LUMBAR SPINE WITHOUT CONTRAST
TECHNIQUE: Multiplanar and multiecho pulse sequences of the thoracic and lumbar
spine were obtained without intravenous contrast.

[Series 4: T2 · sagittal · 4.0mm · 1.33mm/px · 3 of 15 slices shown (1 of 4)]
[im 1/15]
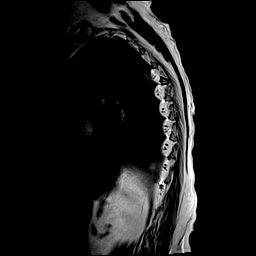
[im 8/15]
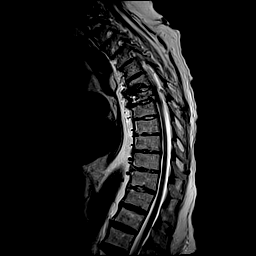
[im 15/15]
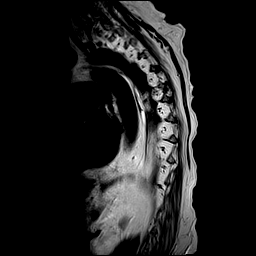

[Series 5: T1 · sagittal · 4.0mm · 1.33mm/px · 3 of 15 slices shown (1 of 3)]
[im 1/15]
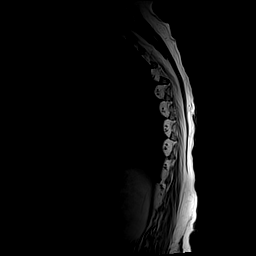
[im 8/15]
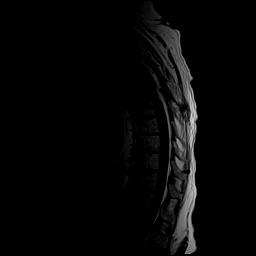
[im 15/15]
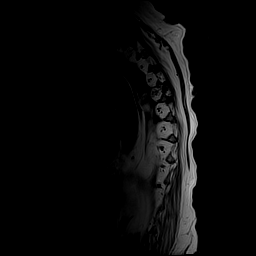

[Series 6: STIR · sagittal · 4.0mm · 1.33mm/px · 3 of 15 slices shown]
[im 1/15]
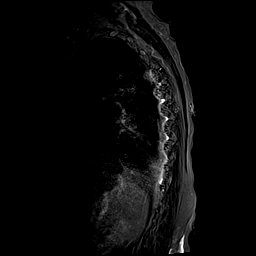
[im 8/15]
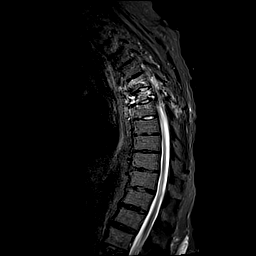
[im 15/15]
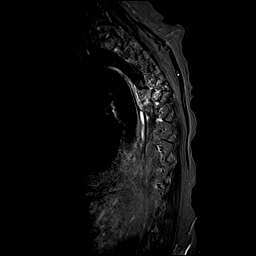

[Series 7: T2 · axial · 5.0mm · 0.86mm/px · z∈[-256,+10]mm · 7 of 40 slices shown (2 of 4)]
[im 1/40]
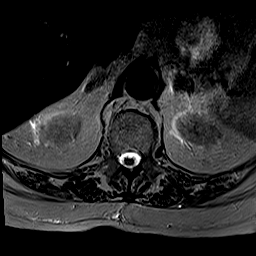
[im 7/40]
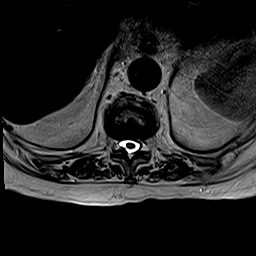
[im 14/40]
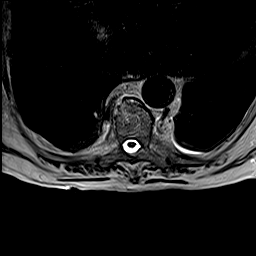
[im 20/40]
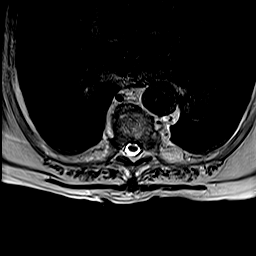
[im 27/40]
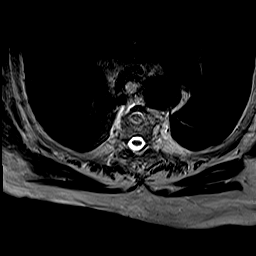
[im 33/40]
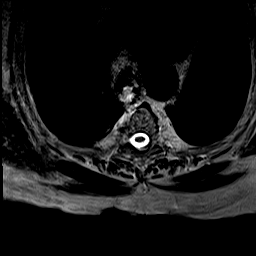
[im 40/40]
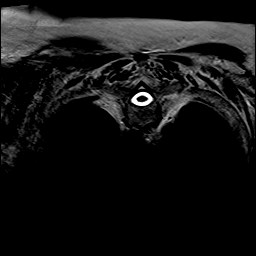

[Series 10: T2 · sagittal · 4.0mm · 1.02mm/px · 3 of 17 slices shown (3 of 4)]
[im 1/17]
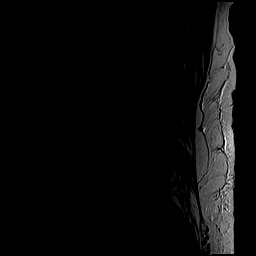
[im 9/17]
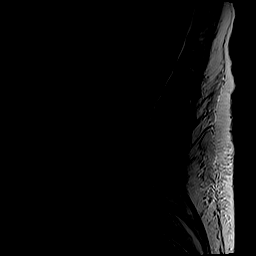
[im 17/17]
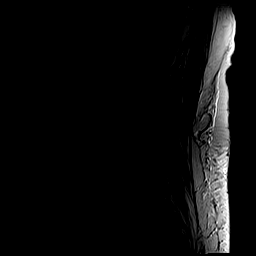

[Series 11: T1 · sagittal · 4.0mm · 1.02mm/px · 3 of 17 slices shown (2 of 3)]
[im 1/17]
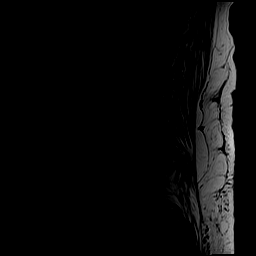
[im 9/17]
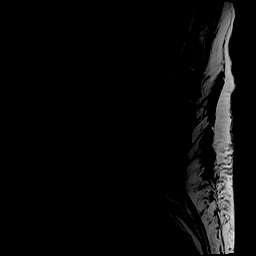
[im 17/17]
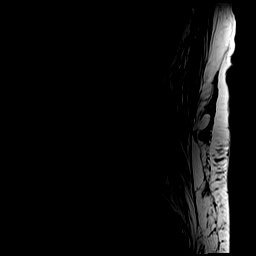

[Series 13: T2 · axial · 4.0mm · 0.78mm/px · z∈[-486,-283]mm · 7 of 38 slices shown (4 of 4)]
[im 1/38]
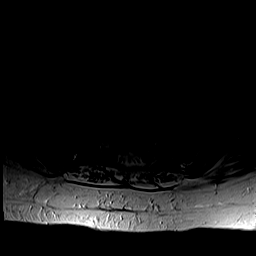
[im 7/38]
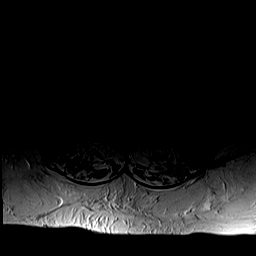
[im 13/38]
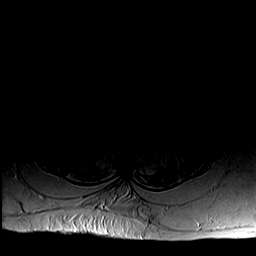
[im 19/38]
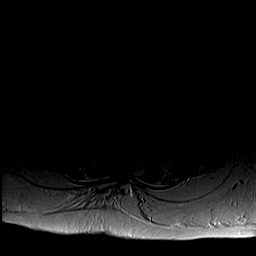
[im 25/38]
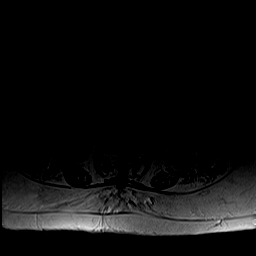
[im 31/38]
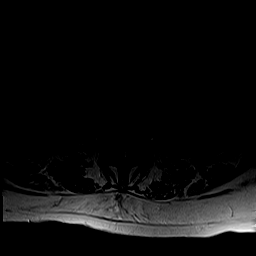
[im 38/38]
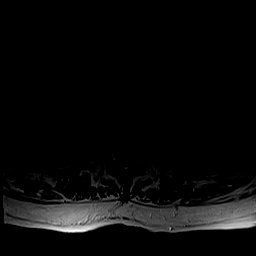

[Series 14: T1 · axial · 4.0mm · 0.39mm/px · z∈[-486,-283]mm · 7 of 38 slices shown (3 of 3)]
[im 1/38]
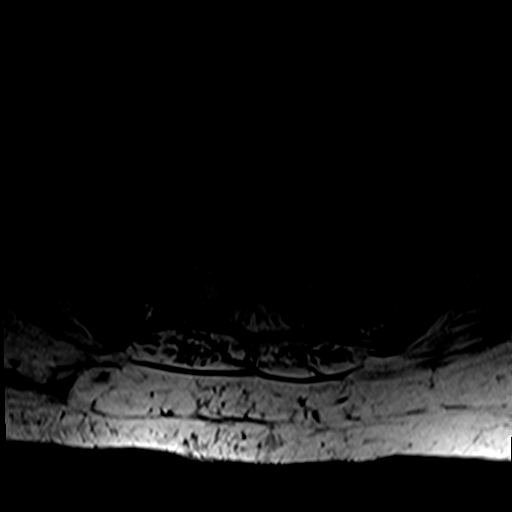
[im 7/38]
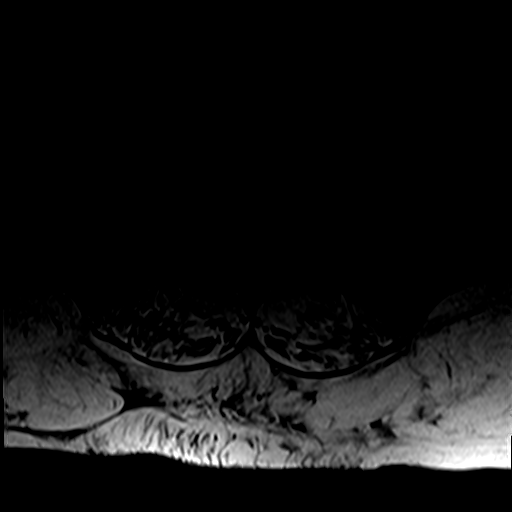
[im 13/38]
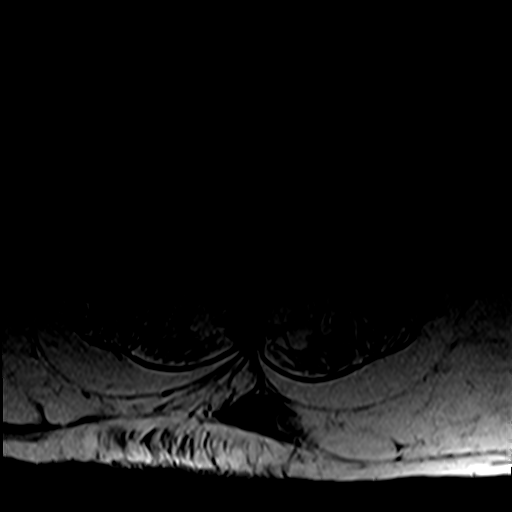
[im 19/38]
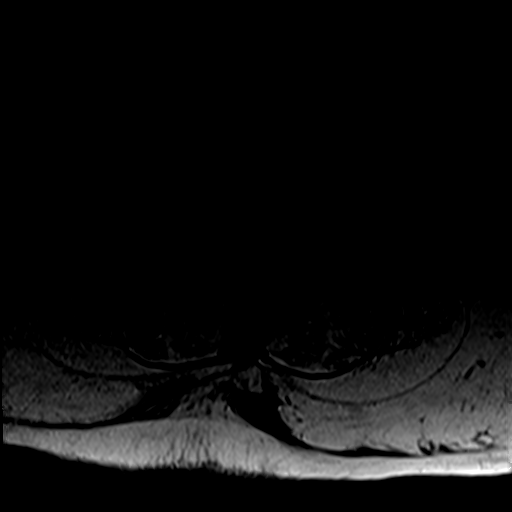
[im 25/38]
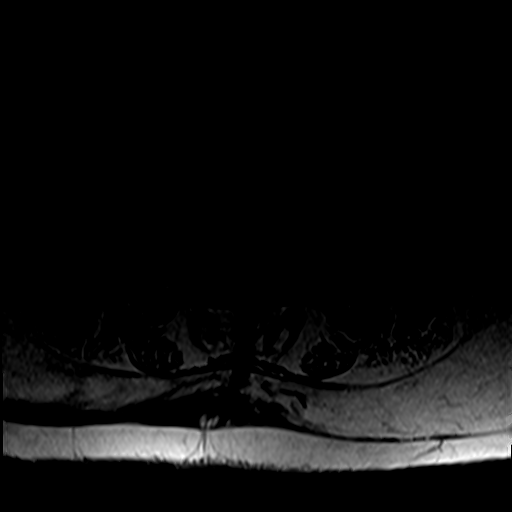
[im 31/38]
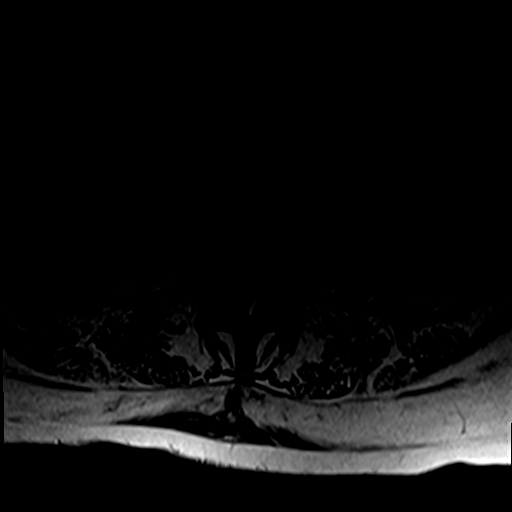
[im 38/38]
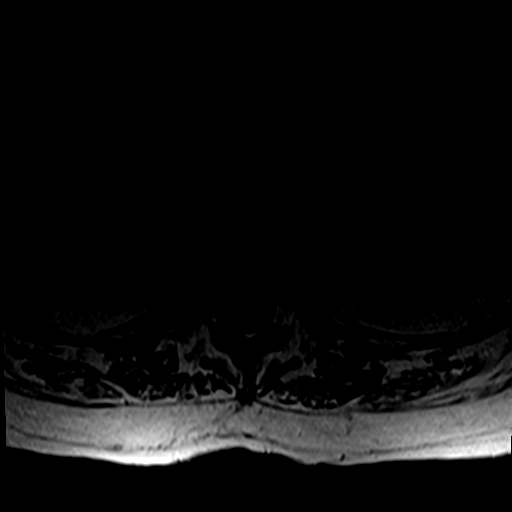

[36 of 48 positions shown; findings below may reference images not displayed]

FINDINGS: MRI THORACIC SPINE FINDINGS

Alignment: Exaggeration of the normal thoracic kyphosis. No
listhesis.

Vertebrae: There is abnormal T1 hypointense, T2/STIR hyperintense
signal intensity extending through the T5 and T6 vertebral bodies
subtle linear hypodensities extending through the inferior aspect of
T5 and mid aspect of T6 suggestive of acute fractures, most obvious
at the T6 level. There is associated height loss of up to 40% at the
central aspect of the T5 level without bony retropulsion. Associated
height loss of up to 40% at T6 with trace 2-3 mm bony retropulsion.
Abnormal edema extends into the posterior elements, particularly at
the T6 level with extension into the T6-7 interspinous ligaments.
Edema present within the T5-6 and T6-7 interspaces as well, with
small amount of edema within the adjacent superior endplate of T7.
No definite associated fracture at the T7 level identified.
Endplates are relatively preserved at this time without significant
erosive changes. There is a superimposed epidural collection at the
level of T6-7 involving the dorsal epidural space measuring
approximately 4 x 11 x 819 mm (series 6, image 9). Secondary severe
canal stenosis due to retropulsed bone and epidural collection with
impingement of the thoracic spinal cord at the level of T6. Thecal
sac measures 5 mm in AP diameter at its most narrow point (series 7,
image 19). No appreciable cord signal changes. While these findings
may reflect acute fractures with associated epidural hematoma,
possible acute infection with osteomyelitis discitis could also be
considered in the correct clinical setting.

Cord: Cord impingement and compression at the T6 level. No definite
cord signal changes. Signal intensity within the thoracic spinal
cord otherwise normal.

Paraspinal and other soft tissues: Extensive paraspinous edema
adjacent to the thoracic spine none at the level of T4-5 through
T6-7. This may reflect edema related to fracture or possibly
paraspinous phlegmon. Paraspinous soft tissues otherwise within
normal limits.

Disc levels:

On counter sequence, degenerative disc bulging noted within the
cervical spine without significant stenosis.

No other significant degenerative changes seen within the thoracic
spine.

MRI LUMBAR SPINE FINDINGS

Segmentation: Transitional lumbosacral anatomy with partial
lumbarization of the S1 segment. Vertebral body count is made from
the C2 level.

Alignment: Trace retrolisthesis of L5 on S1. Mild exaggeration of
the normal lumbar lordosis.

Vertebrae: Compression deformities involving the L3 and L4 vertebral
bodies are chronic in appearance without associated marrow edema.
Vertebral body heights otherwise maintained. No acute fracture or
malalignment. Bone marrow signal intensity within normal limits.
Benign hemangioma noted within the inferior endplate of L2. No
worrisome osseous lesions.

Conus medullaris: Extends to the L2 level and appears normal.

Paraspinal and other soft tissues: Paraspinous soft tissues
demonstrate no acute abnormality. Chronic fatty atrophy noted within
the posterior paraspinous musculature. Atherosclerotic change
present within the intra-abdominal aorta. Visualized visceral
structures within normal limits.

Disc levels:

L1-2:  Unremarkable.

L2-3: Mild diffuse circumferential disc bulge with disc desiccation.
Mild facet and ligamentum flavum hypertrophy. No stenosis.

L3-4: Mild diffuse disc bulge with disc desiccation. Trace
retropulsion of the L4 vertebral body related to remote compression
fracture. Facet and ligamentum flavum hypertrophy. Resultant
moderate spinal stenosis. No significant foraminal encroachment.

L4-5: Disc desiccation without significant disc bulge. Moderate
facet and ligamentum flavum hypertrophy. Mild canal stenosis. No
significant foraminal encroachment.

L5-S1: Shallow posterior disc bulge. Moderate facet arthrosis, worse
on the left. Ligamentum flavum hypertrophy. Mild epidural
lipomatosis. Mild spinal stenosis. No significant lateral recess
narrowing. Foramina are patent.
IMPRESSION: MR THORACIC SPINE IMPRESSION

1. Abnormal signal intensity involving the T5 and T6 vertebral
bodies with associated fractures as above. Associated height loss of
up to 40% at T5 and T6 with 2-3 mm bony retropulsion at the level of
T6. Edema extends through the posterior elements at the T6 level,
with possible posterior ligamentous injury. Fractures should be
treated as an unstable injury. Associated epidural collection
(presumably hemorrhage) at the level of T6 with fairly severe spinal
stenosis and impingement of the thoracic spinal cord. Extensive
associated paraspinous edema as well. While these findings are most
likely posttraumatic in nature, appearance is somewhat unusual, and
possible underlying infection could be considered in the correct
clinical setting. Correlation with symptomatology and laboratory
values recommended. Postcontrast imaging may be helpful for further
delineation as indicated.

MR LUMBAR SPINE IMPRESSION

1. No acute abnormality within the lumbar spine.
2. Chronic compression deformities involving the L3 and L4 vertebral
bodies.
3. Multifactorial degenerative changes at L3-4 with resultant
moderate spinal stenosis.
4. Prominent facet arthrosis at L5-S1, greater on the left.

## 2019-04-21 IMAGING — CT CT ANGIO CHEST
2 of 6 series · 18 of 46 positions shown · IV contrast (APPLIED)
Comparison: Chest radiographs obtained earlier today.

CLINICAL DATA: Increased shortness of breath. Bilateral lower
extremity edema. Ex-smoker.

EXAM:
CT ANGIOGRAPHY CHEST WITH CONTRAST
TECHNIQUE: Multidetector CT imaging of the chest was performed using the
standard protocol during bolus administration of intravenous
contrast. Multiplanar CT image reconstructions and MIPs were
obtained to evaluate the vascular anatomy.
CONTRAST:  75 cc Isovue 370

[Series 5: thins · axial · 0.73mm/px · z∈[-323,-76]mm · 16 of 271 slices shown]
[im 12/271  lung]
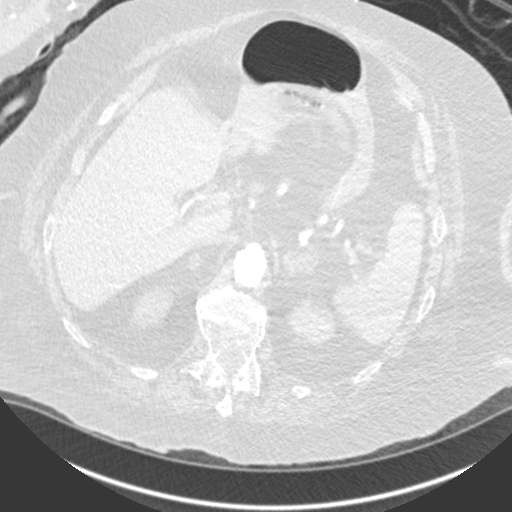
[im 36/271  soft-tissue]
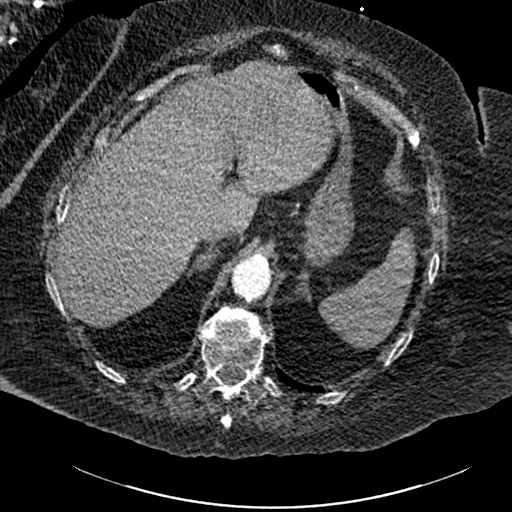
[im 47/271  lung]
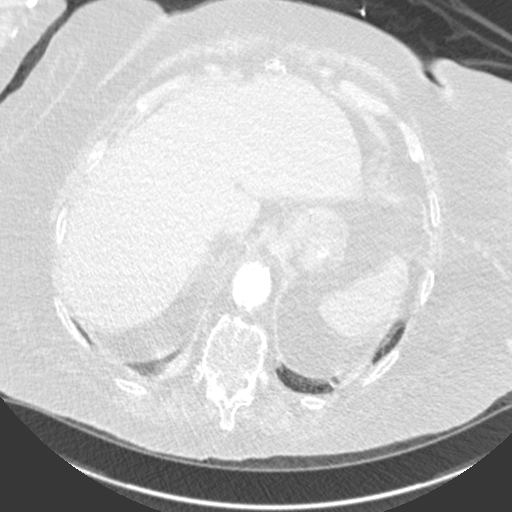
[im 59/271  soft-tissue]
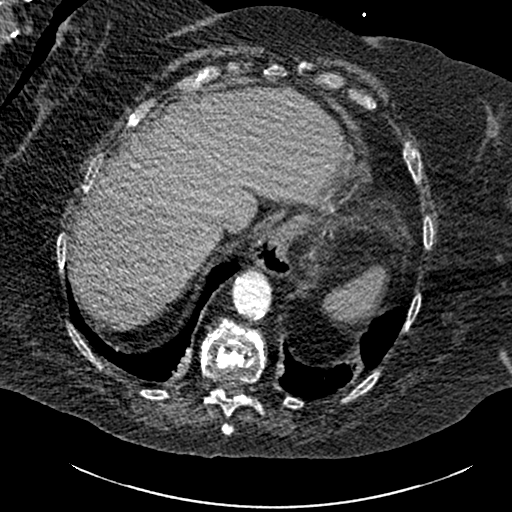
[im 83/271  lung]
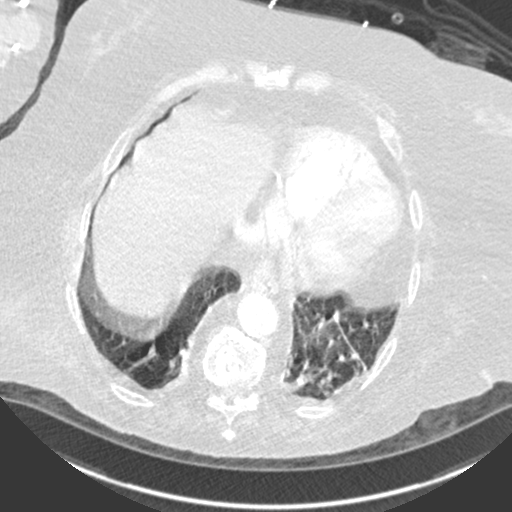
[im 94/271  soft-tissue]
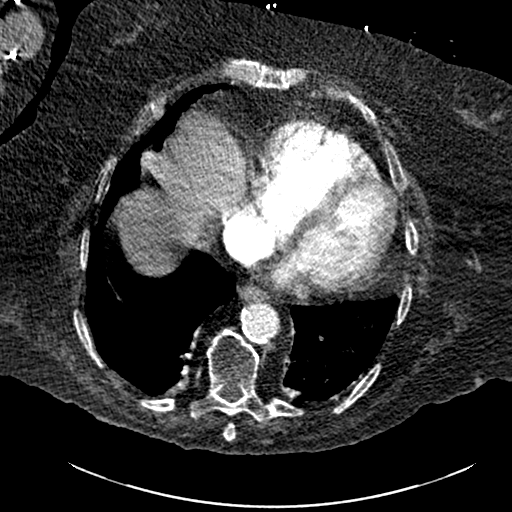
[im 106/271  lung]
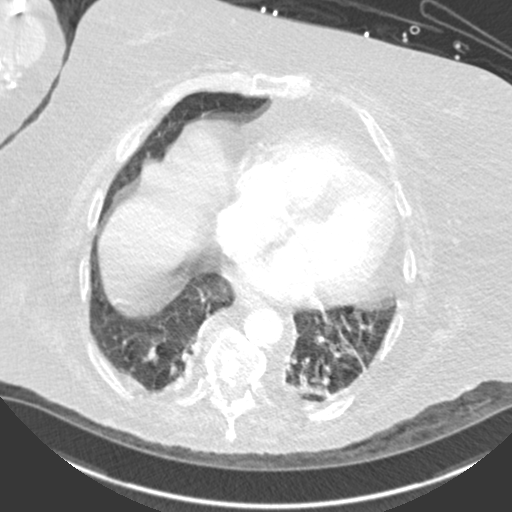
[im 130/271  soft-tissue]
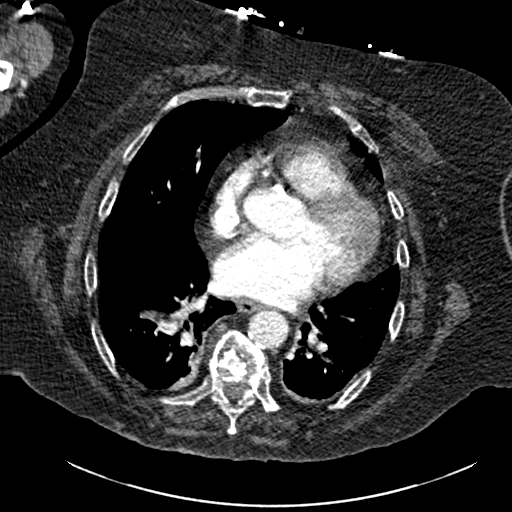
[im 141/271  lung]
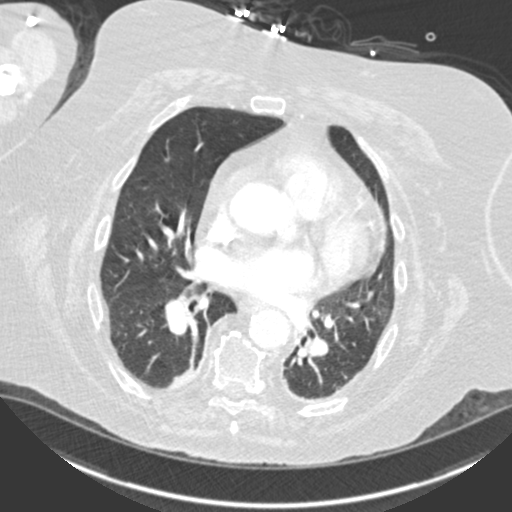
[im 165/271  soft-tissue]
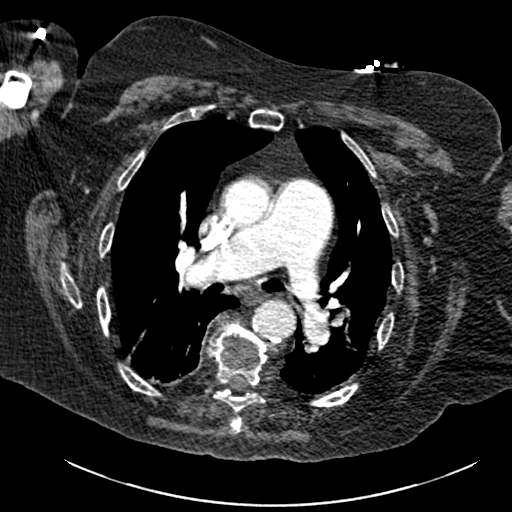
[im 177/271  lung]
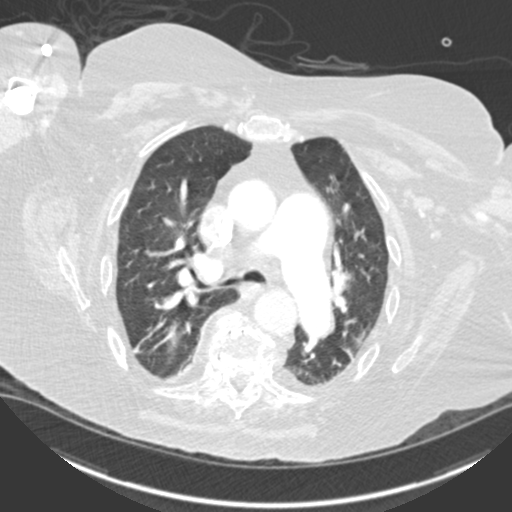
[im 188/271  soft-tissue]
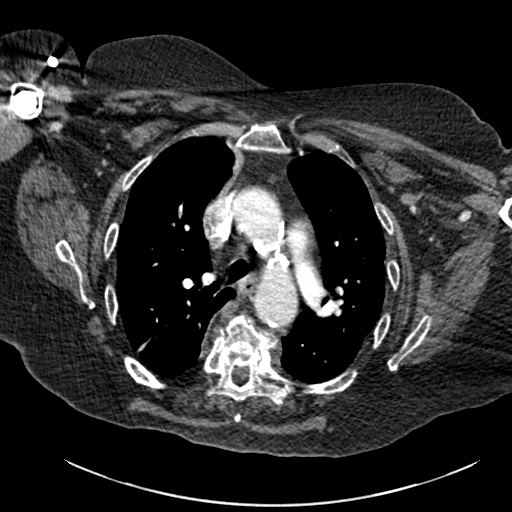
[im 212/271  lung]
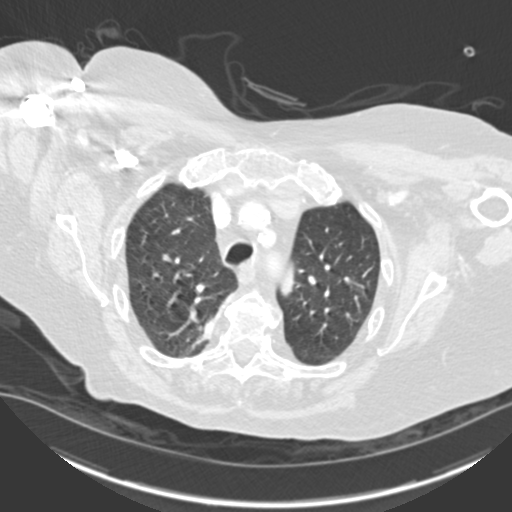
[im 224/271  soft-tissue]
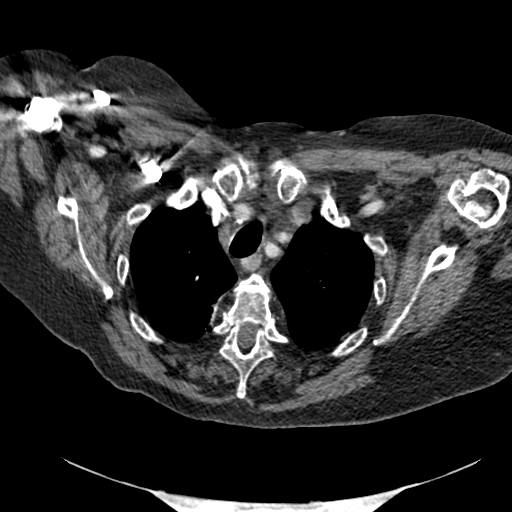
[im 235/271  lung]
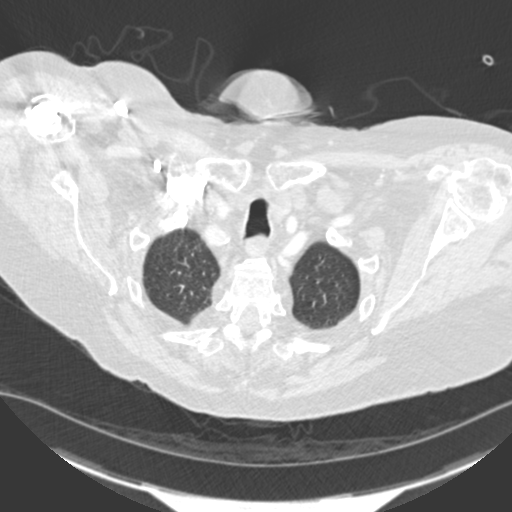
[im 259/271  soft-tissue]
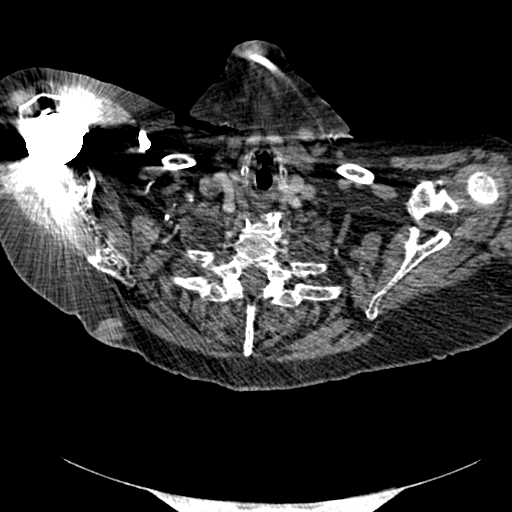

[Series 7: coronal mpr · coronal · 0.53mm/px · 2 of 101 slices shown]
[im 34/101  soft-tissue]
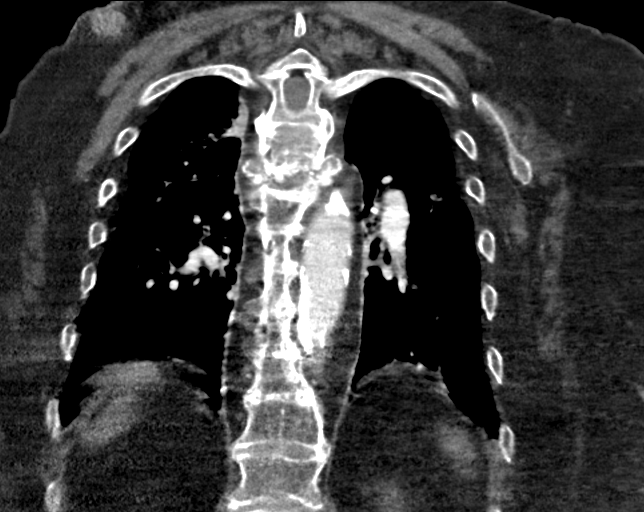
[im 67/101  soft-tissue]
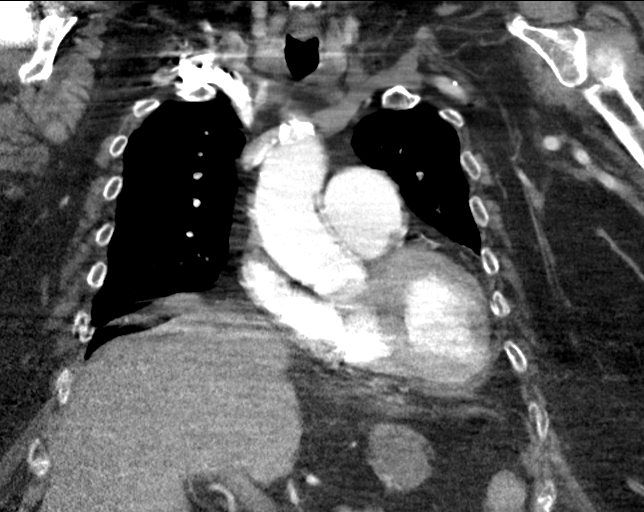

[18 of 46 positions shown; findings below may reference images not displayed]

FINDINGS: Cardiovascular: Atheromatous calcifications, including the coronary
arteries and aorta. Normally opacified pulmonary arteries with no
pulmonary arterial filling defects seen. The central pulmonary
arteries are enlarged. The main pulmonary artery measures 4.4 cm in
maximum diameter in the right main pulmonary artery measures 3.7 cm
in maximum diameter. The left main pulmonary artery measures 2.9 cm
in maximum diameter.

Mediastinum/Nodes: Small hiatal hernia. No enlarged lymph nodes.
Unremarkable thyroid gland.

Lungs/Pleura: Bilateral linear atelectasis and/or scarring. No lung
nodules or pleural fluid.

Upper Abdomen: Unremarkable.

Musculoskeletal: Interval 70% compression fracture of the T6
vertebral body with anteriorly displaced fragments and spur
formation with further disruption of the ossified anterior
longitudinal ligament. Thoracic spine degenerative changes with
anterior longitudinal ligament ossification elsewhere. Right
shoulder prosthesis.

Review of the MIP images confirms the above findings.
IMPRESSION: 1. No pulmonary emboli.
2. Enlarged central pulmonary arteries, compatible with pulmonary
arterial hypertension.
3. Interval approximately 70% T6 vertebral body compression fracture
with anteriorly displaced fragments and no significant retropulsion.
4. Small hiatal hernia.
5. Bilateral linear atelectasis and/or scarring.
6. Calcified aortic atherosclerosis and minimal calcified coronary
artery atherosclerosis.

Aortic Atherosclerosis (LILUK-S3J.J).

## 2019-04-21 IMAGING — CR DG CHEST 2V
2 series · 3 of 3 positions shown · non-contrast
Comparison: Radiograph August 19, 2017.

CLINICAL DATA: Cough, shortness of breath.

EXAM:
CHEST  2 VIEW

[chest pa]
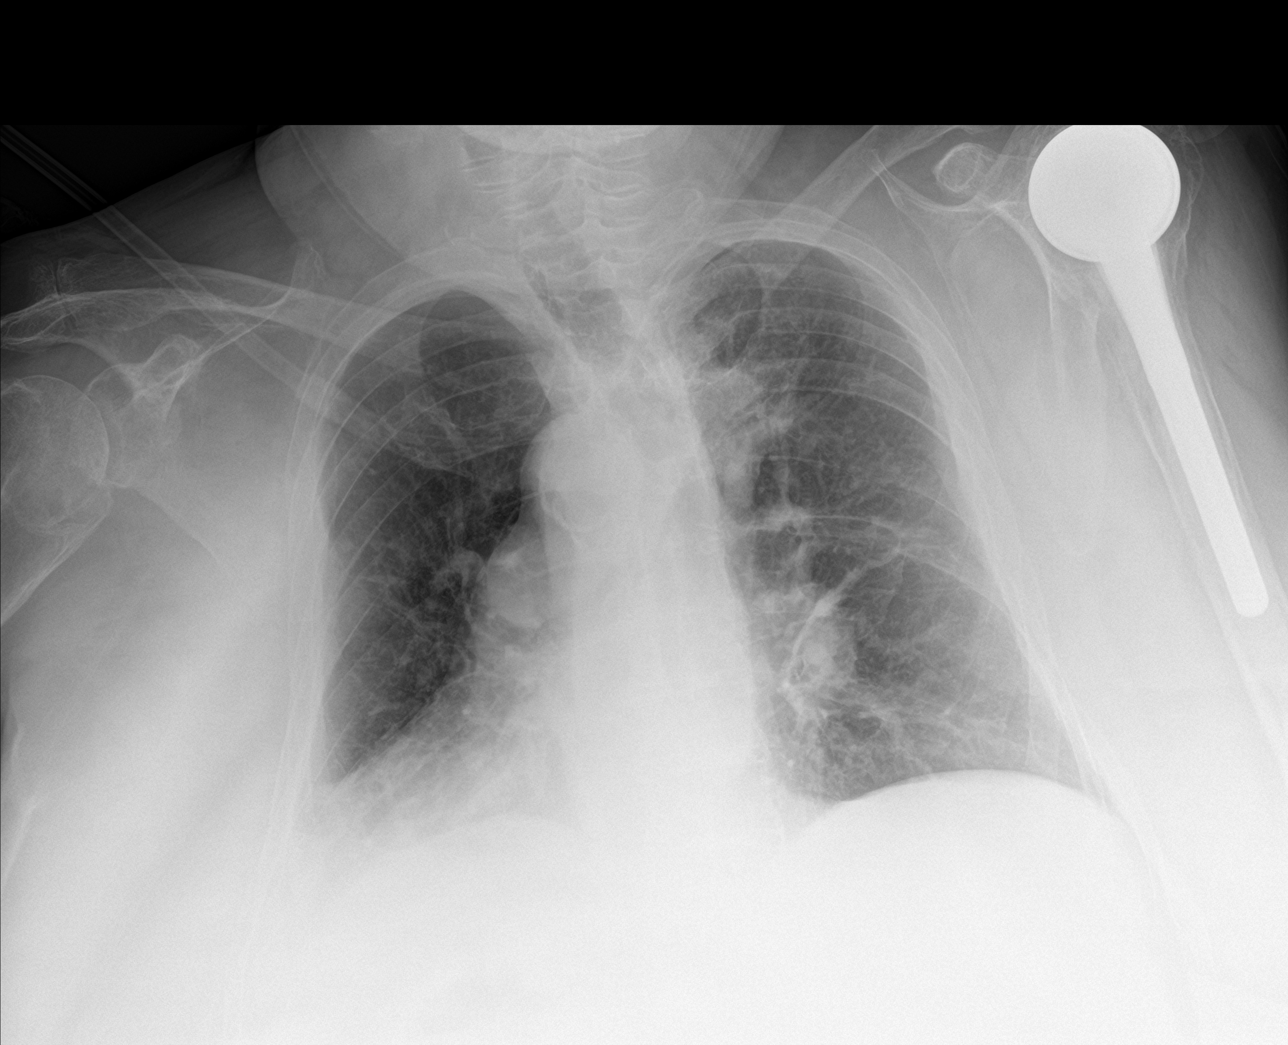

[Series 2: chest lat · 0.14mm/px · 2 of 2 slices shown]
[im 1/2]
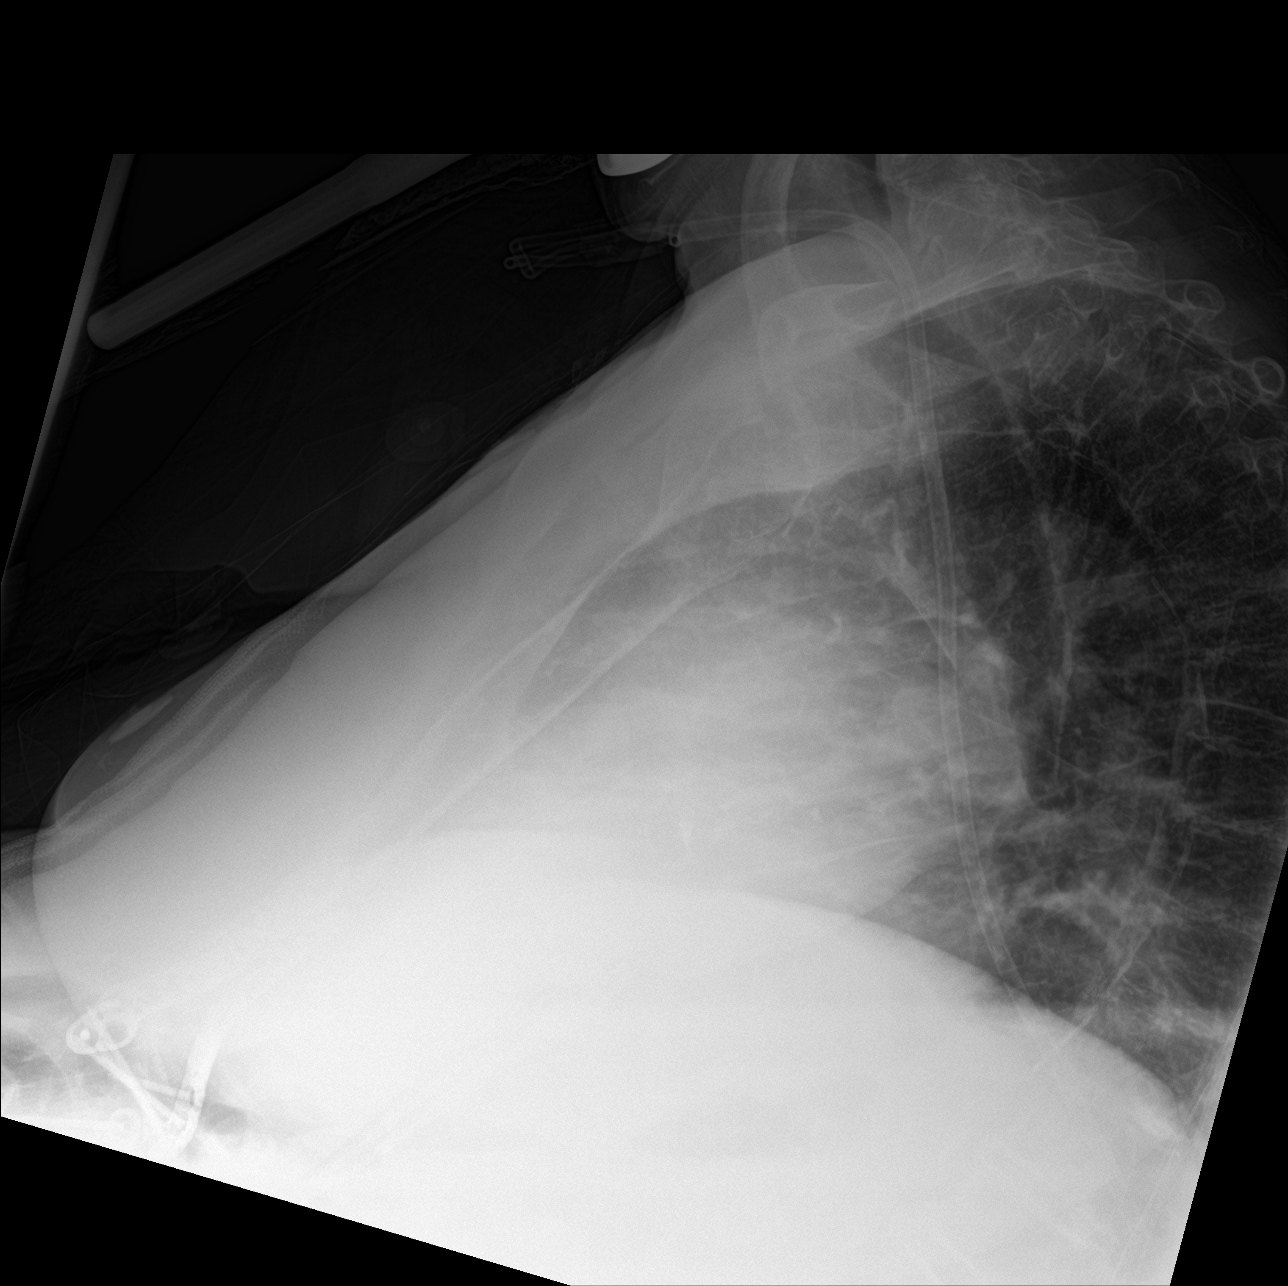
[im 2/2]
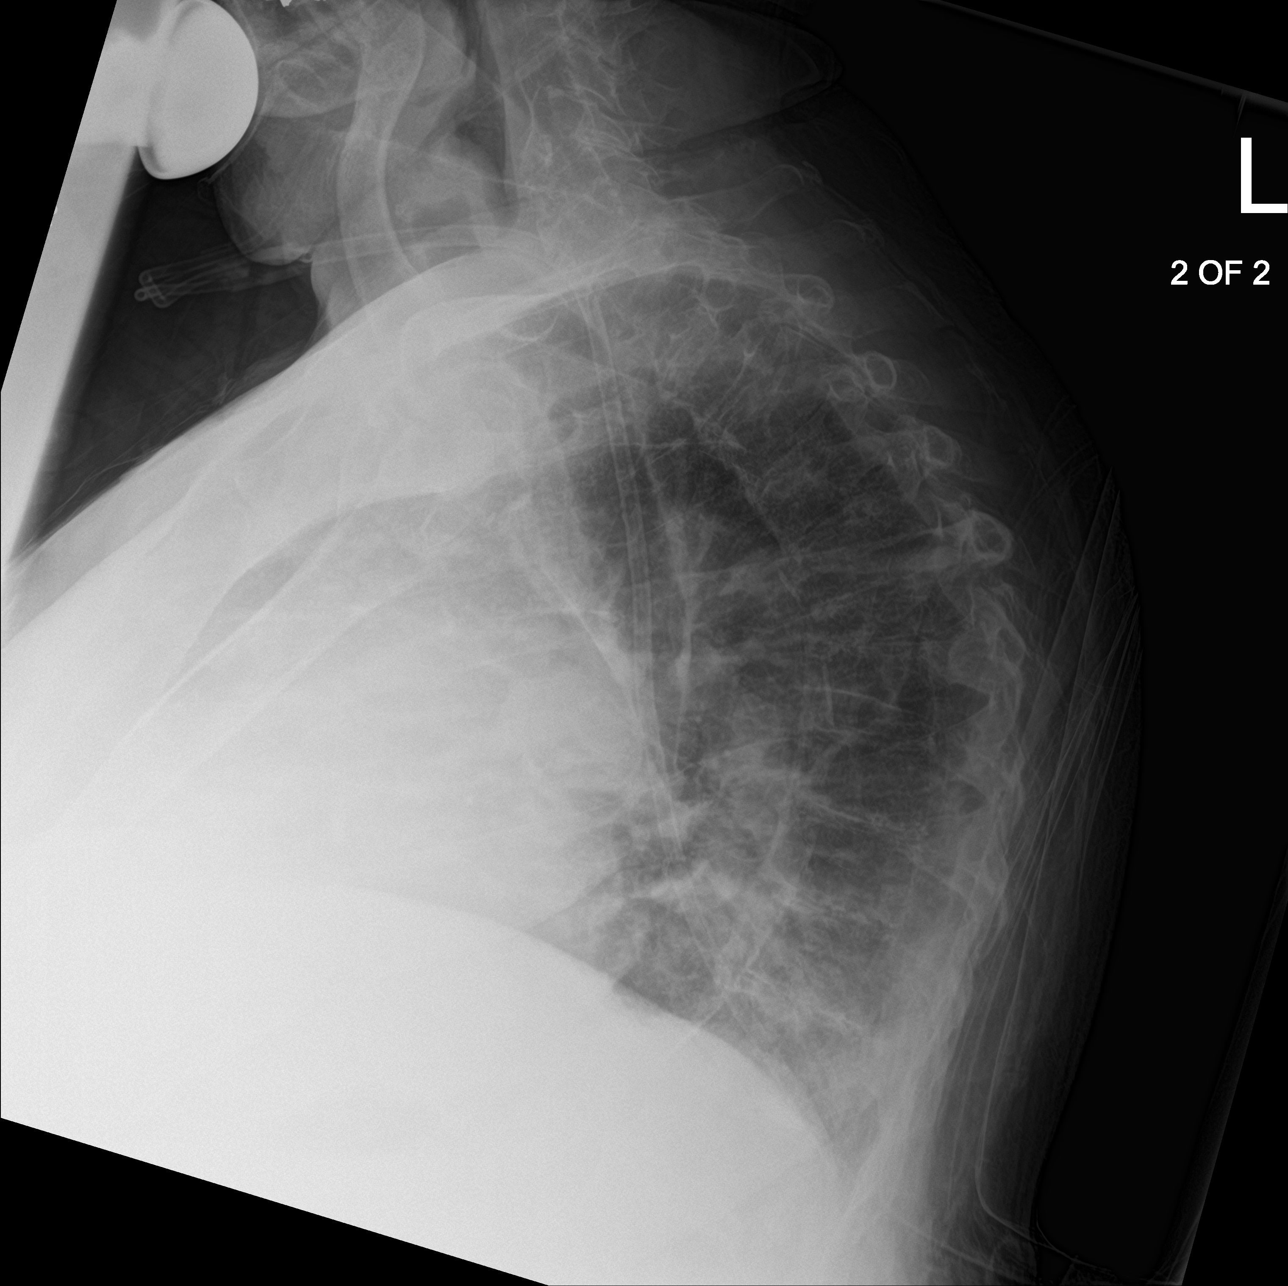

[3 of 3 positions shown; findings below may reference images not displayed]

FINDINGS: Stable cardiomediastinal silhouette. Atherosclerosis of thoracic
aorta is noted. Status post right shoulder arthroplasty. No
pneumothorax is noted. Left basilar subsegmental atelectasis is
noted with probable minimal left pleural effusion. Right perihilar
subsegmental atelectasis is noted.
IMPRESSION: Aortic atherosclerosis. Left basilar subsegmental atelectasis with
probable minimal left pleural effusion. Right perihilar subsegmental
atelectasis.

## 2019-05-28 IMAGING — CR DG THORACIC SPINE 2V
1 series · 3 of 3 positions shown · non-contrast
Comparison: None.

CLINICAL DATA: 76-year-old female with compression fracture.
Paralyzed from waist down. Subsequent encounter.

EXAM:
THORACIC SPINE 2 VIEWS

[Series 1: dg thoracic spine 2 view · 0.14mm/px · 3 of 3 slices shown]
[im 1/3]
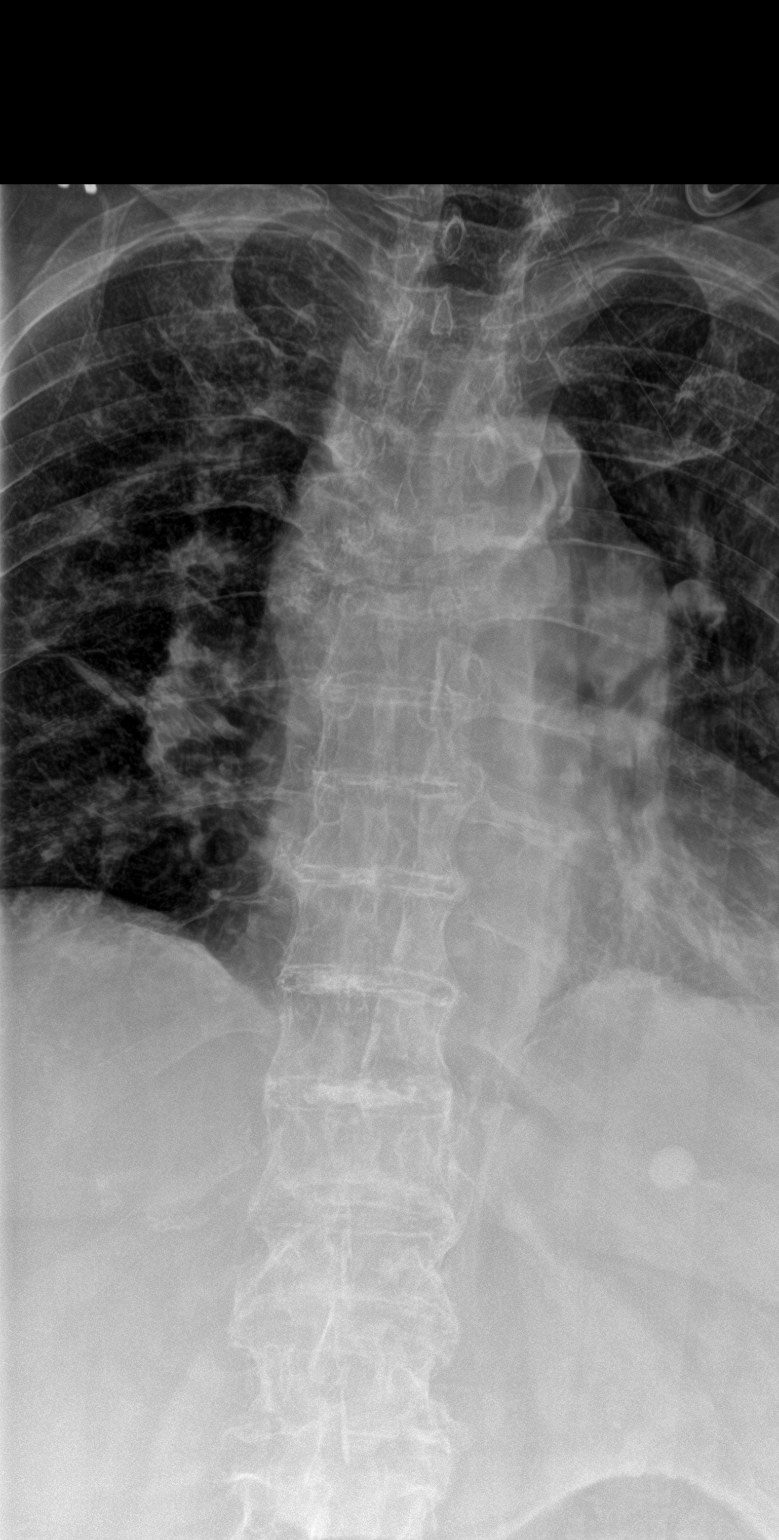
[im 2/3]
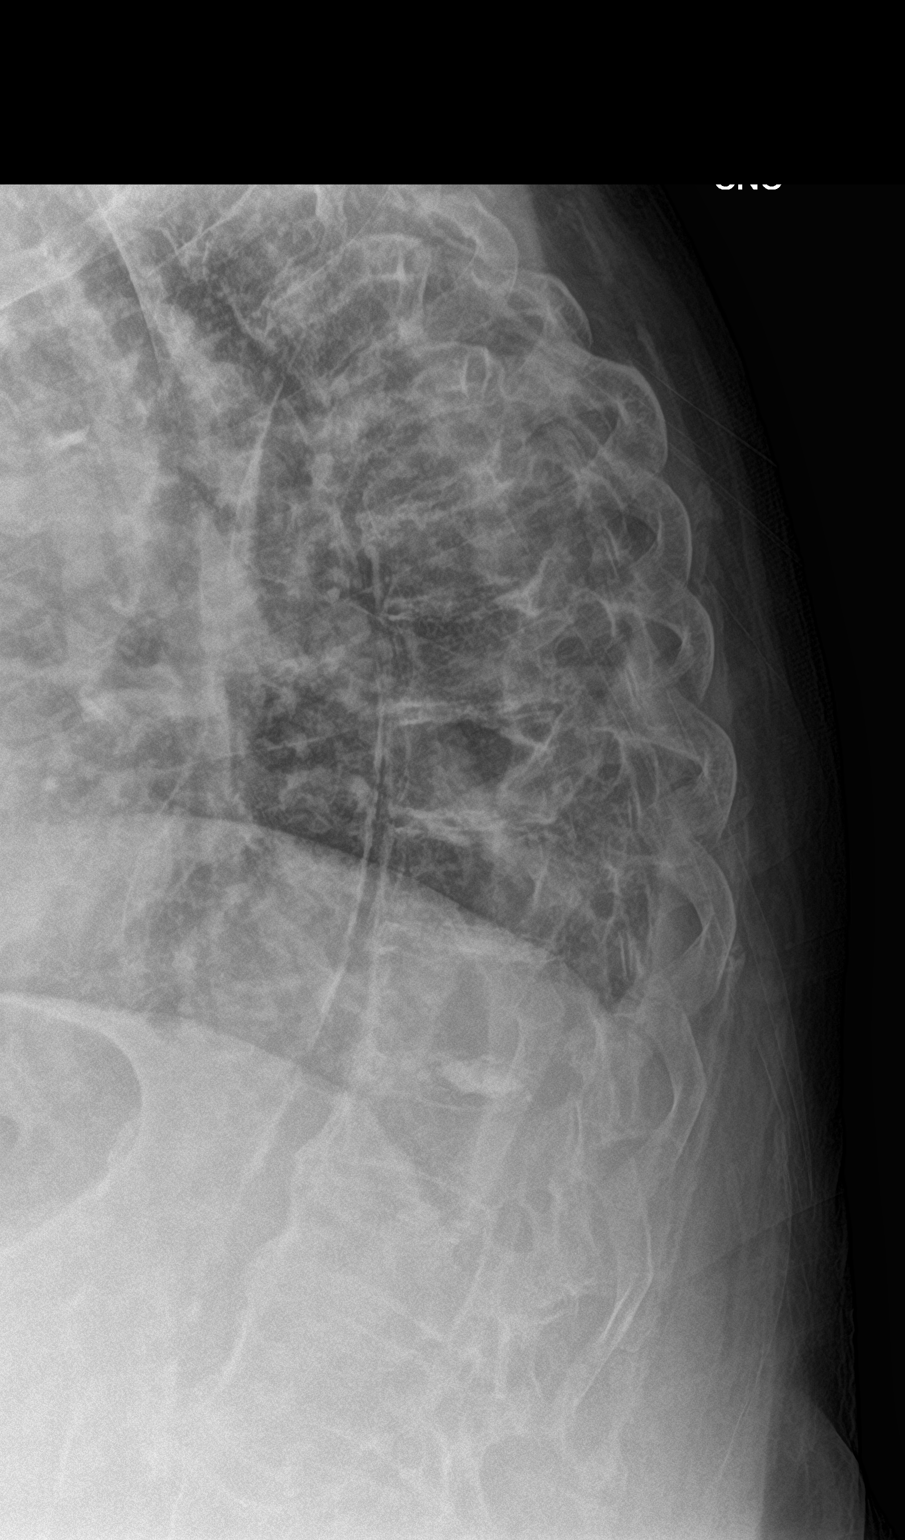
[im 3/3]
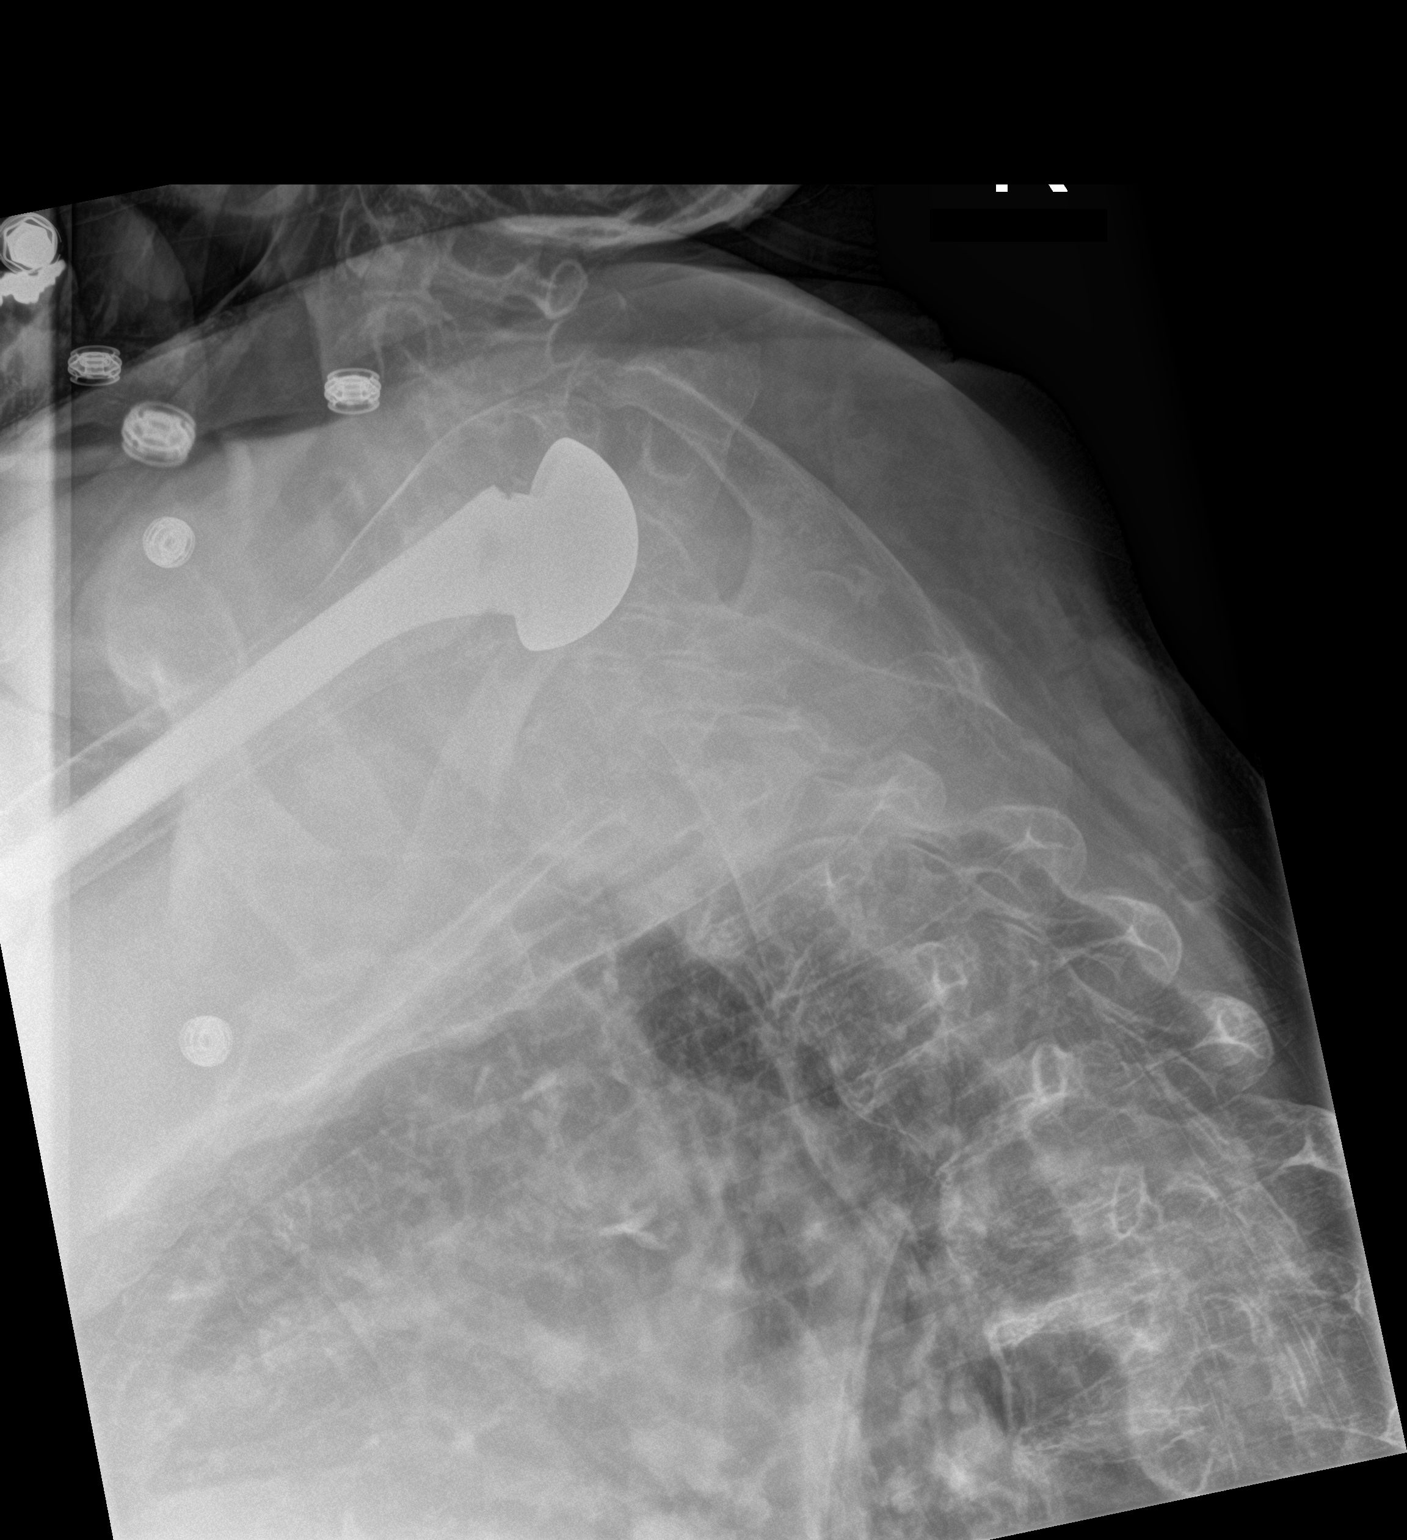

[3 of 3 positions shown; findings below may reference images not displayed]

FINDINGS: Rotated lateral view and osteopenia limit evaluation of the
compression fractures involving portions of T5, T6 and T7. Cannot
evaluate if there has been progressive retropulsion of the
compressed T6 vertebral body. CT/ MR Imaging would be necessary for
further delineation.

Vascular calcifications.
IMPRESSION: Rotated lateral view and osteopenia limit evaluation of the
compression fractures involving portions of T5, T6 and T7. Cannot
evaluate if there has been progressive retropulsion of the
compressed T6 vertebral body. CT/ MR Imaging would be necessary for
further delineation.

Aortic Atherosclerosis ([id number]).

## 2019-06-02 IMAGING — DX DG CHEST 1V PORT
1 series · 1 of 1 positions shown · non-contrast
Comparison: 09/11/2017 chest radiograph.

CLINICAL DATA: 76 y/o  F; acute onset of shortness of breath.

EXAM:
PORTABLE CHEST 1 VIEW

[chest ap]
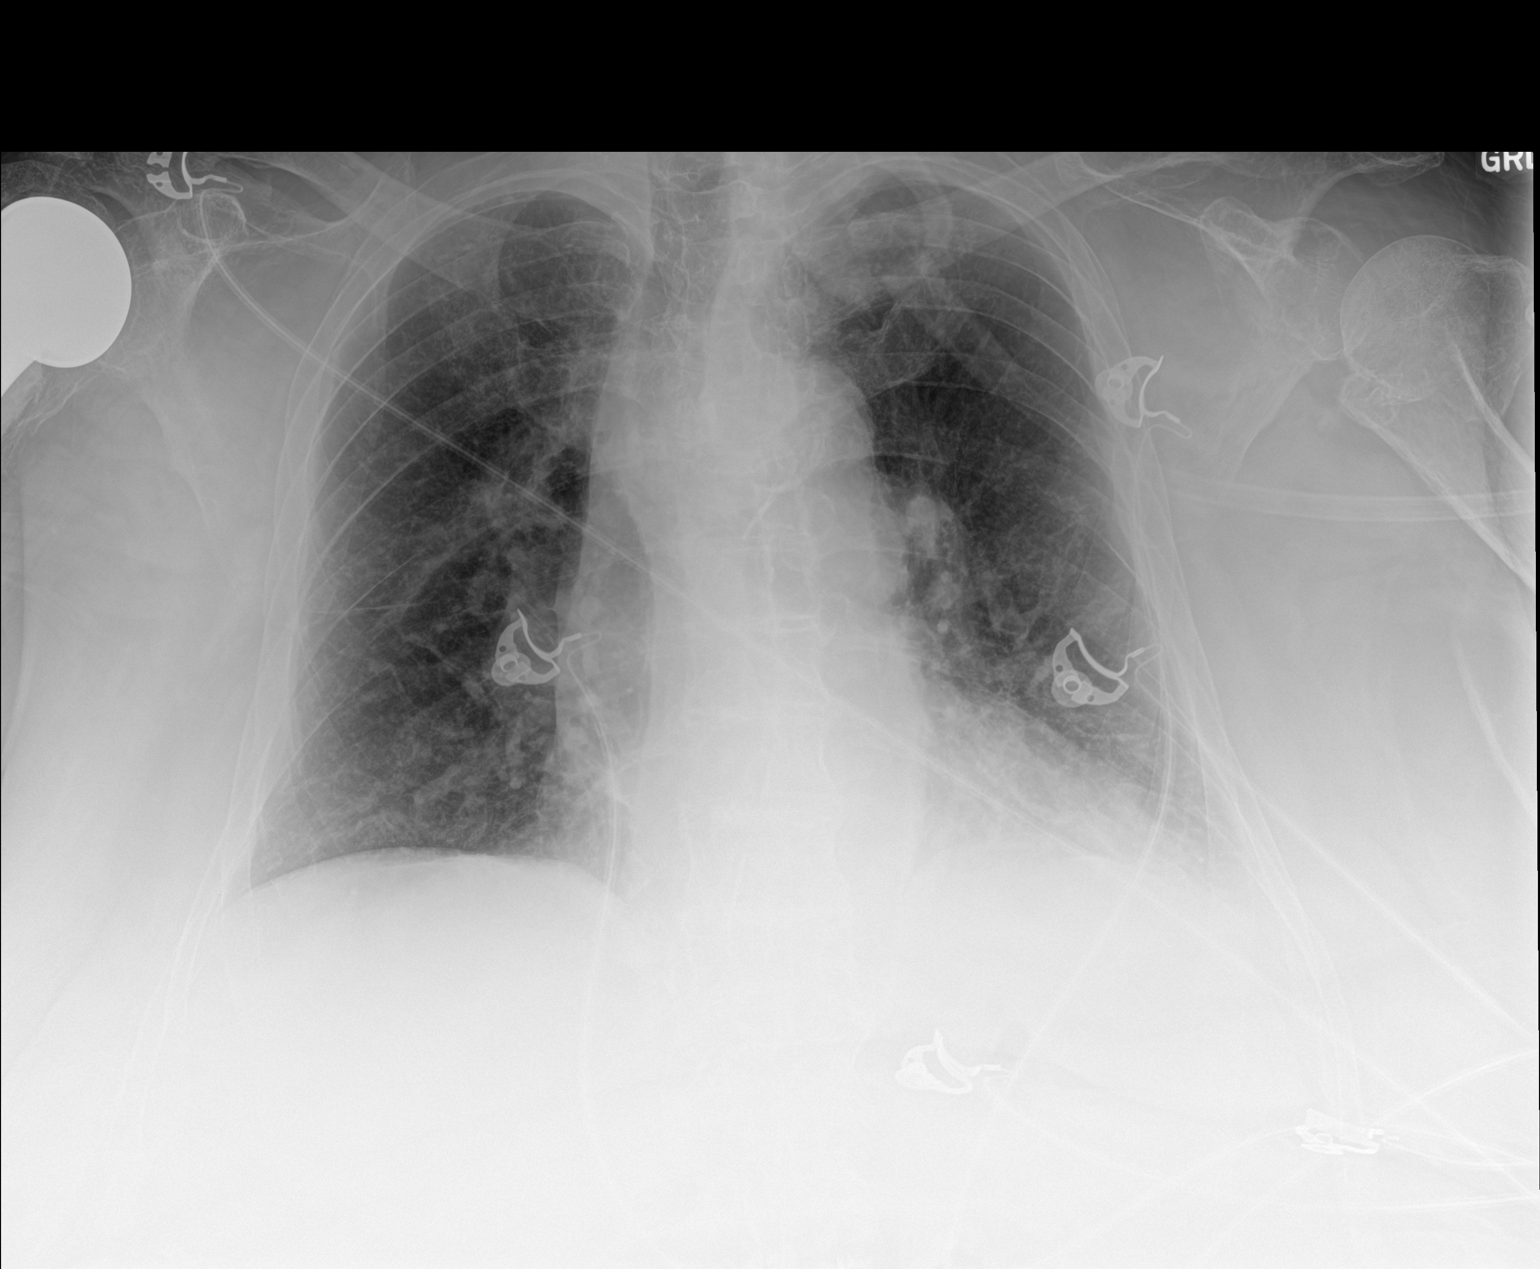

[1 of 1 positions shown; findings below may reference images not displayed]

FINDINGS: Normal cardiac silhouette. Aortic atherosclerosis with
calcification. Bibasilar bronchitic changes. No focal consolidation.
Possible small left effusion. No pneumothorax. Multilevel
degenerative changes of the spine. Chronic left proximal humerus
fracture and right shoulder arthroplasty partially visualized.
IMPRESSION: Bronchitic changes in lung bases. Stable cardiac silhouette. Aortic
atherosclerosis. Possible small left effusion.

By: Isaac Jacob Era M.D.

## 2019-06-03 IMAGING — CT CT ANGIO CHEST
2 of 6 series · 18 of 46 positions shown · IV contrast (APPLIED)
Comparison: Chest radiograph performed 10/23/2017, and CTA of the
chest performed 09/11/2017

CLINICAL DATA: Acute onset of generalized chest pain and difficulty
breathing. Syncope.

EXAM:
CT ANGIOGRAPHY CHEST WITH CONTRAST
TECHNIQUE: Multidetector CT imaging of the chest was performed using the
standard protocol during bolus administration of intravenous
contrast. Multiplanar CT image reconstructions and MIPs were
obtained to evaluate the vascular anatomy.
CONTRAST:  75mL SAXFHP-S02 IOPAMIDOL (SAXFHP-S02) INJECTION 76%

[Series 5: thins · axial · 0.71mm/px · z∈[-23,+250]mm · 15 of 300 slices shown]
[im 14/300  lung]
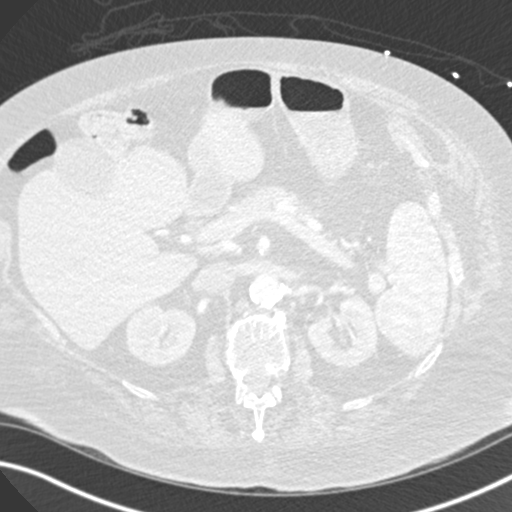
[im 40/300  soft-tissue]
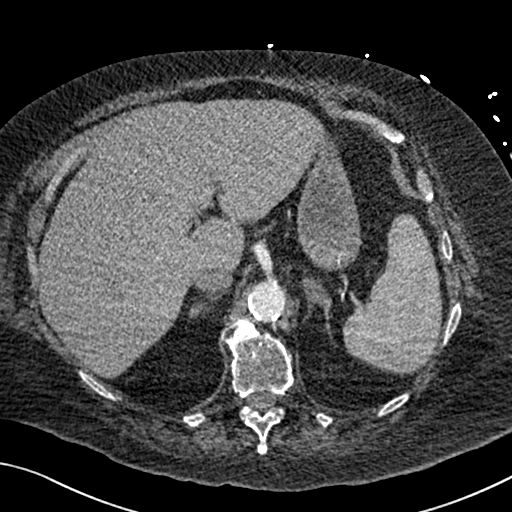
[im 53/300  lung]
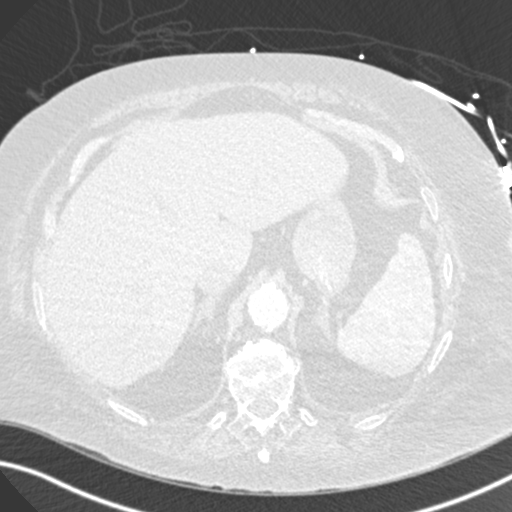
[im 79/300  soft-tissue]
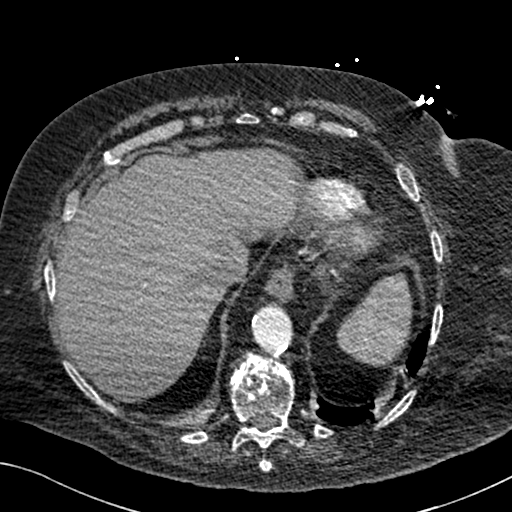
[im 92/300  lung]
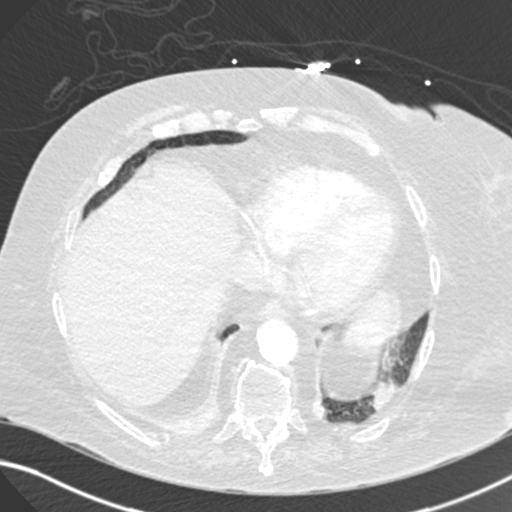
[im 118/300  soft-tissue]
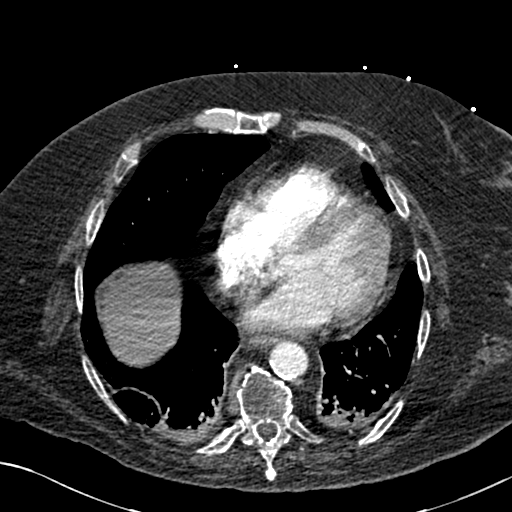
[im 131/300  lung]
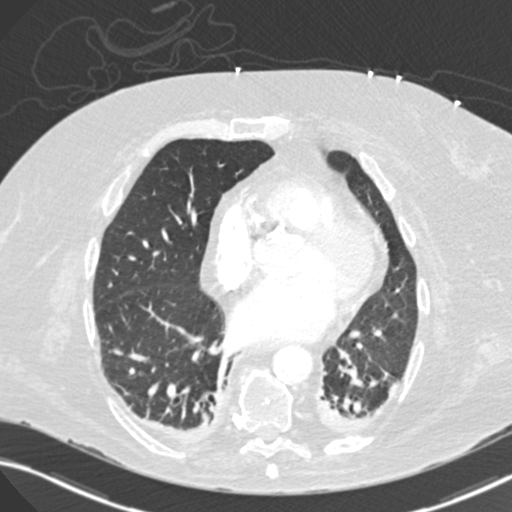
[im 157/300  soft-tissue]
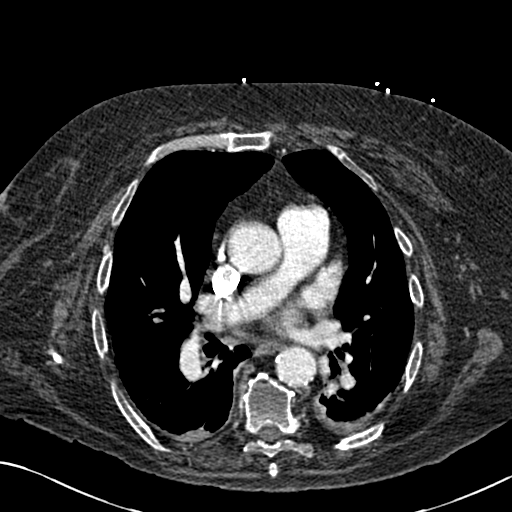
[im 170/300  lung]
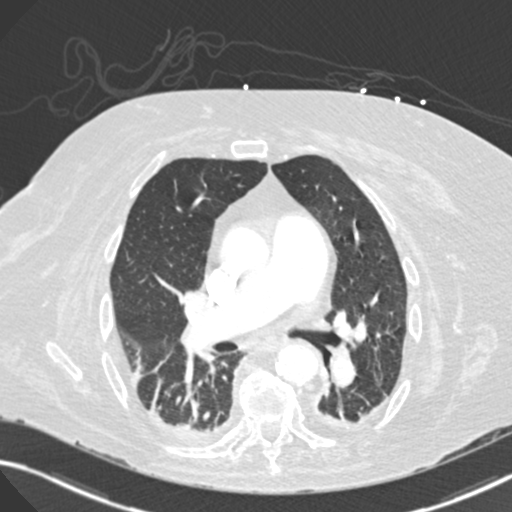
[im 183/300  soft-tissue]
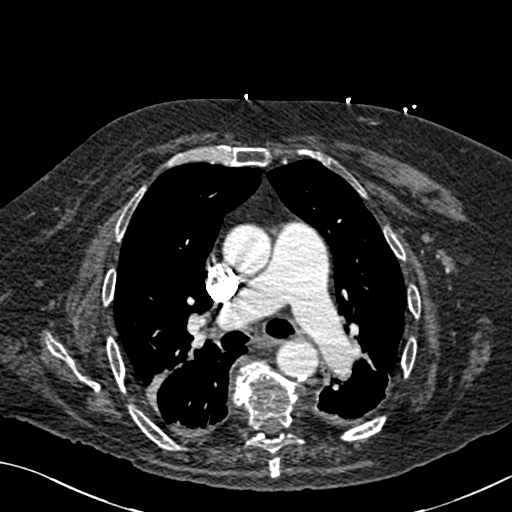
[im 209/300  lung]
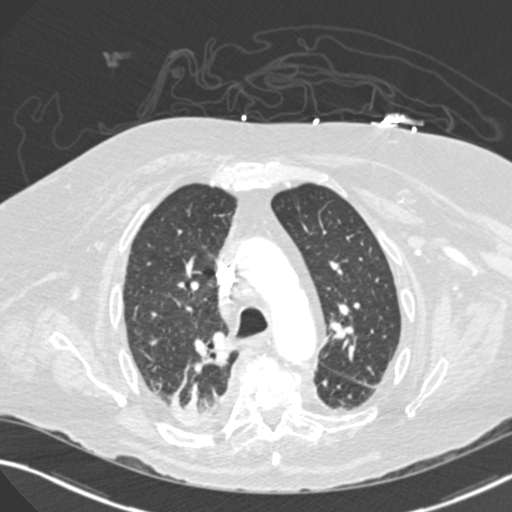
[im 222/300  soft-tissue]
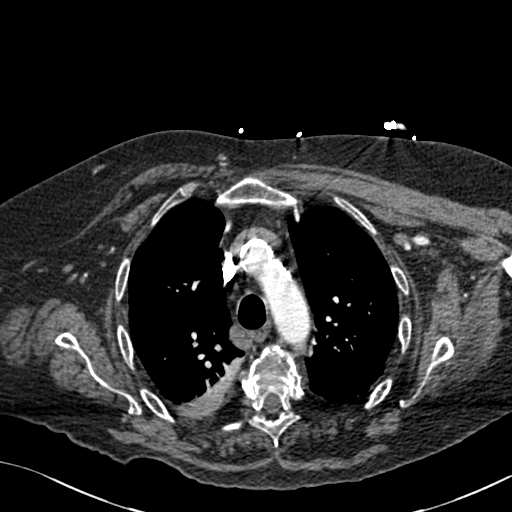
[im 248/300  lung]
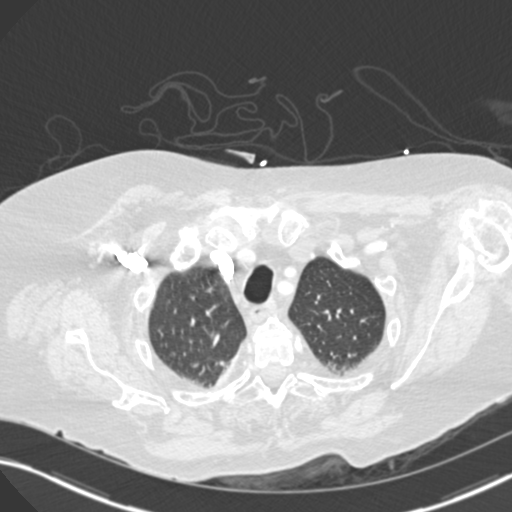
[im 261/300  soft-tissue]
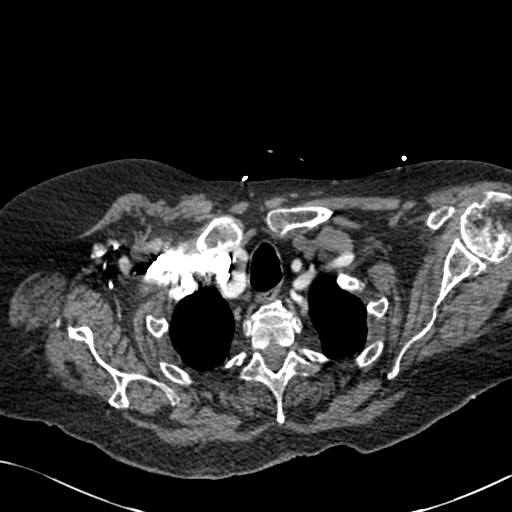
[im 287/300  lung]
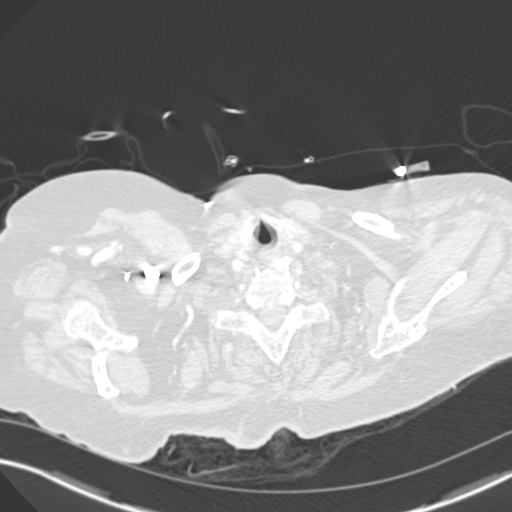

[Series 7: coronal mpr · coronal · 0.59mm/px · 3 of 105 slices shown]
[im 27/105  soft-tissue]
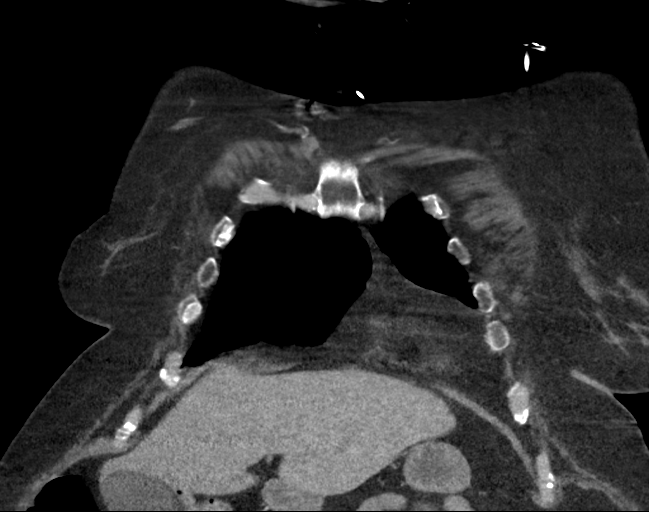
[im 53/105  soft-tissue]
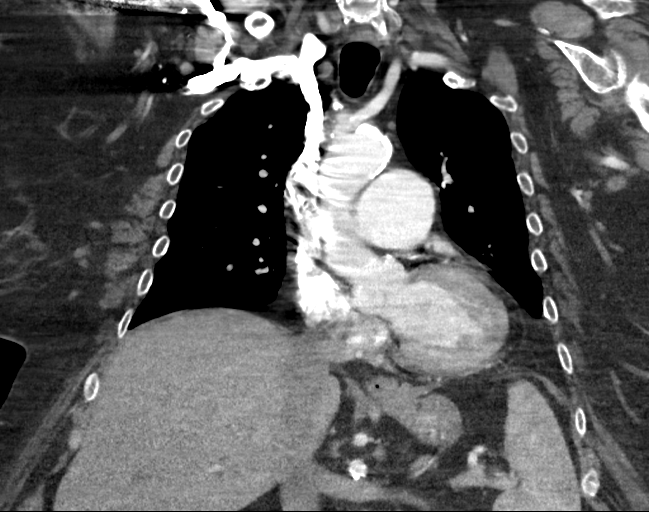
[im 79/105  soft-tissue]
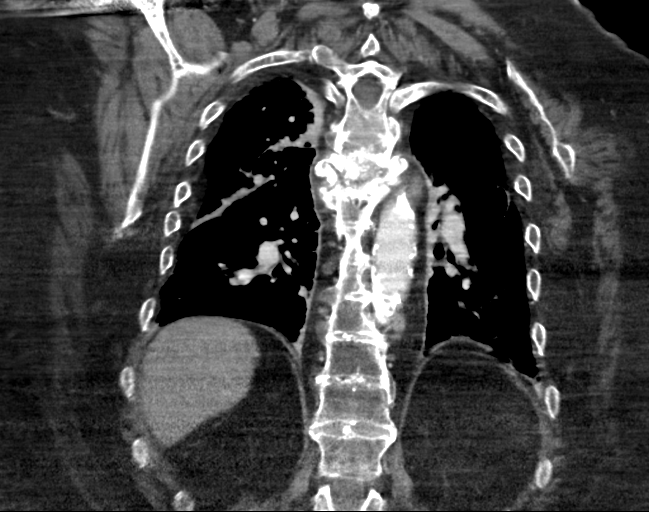

[18 of 46 positions shown; findings below may reference images not displayed]

FINDINGS: Cardiovascular: There is no definite evidence of pulmonary embolus.
Evaluation for pulmonary embolus is mildly suboptimal due to low
beam hardening artifact.

Scattered coronary artery calcifications are seen. Scattered
calcification is noted along the aortic arch and great vessels. The
heart is normal in size.

Mediastinum/Nodes: The mediastinum is otherwise unremarkable in
appearance. No mediastinal lymphadenopathy is seen. No pericardial
effusion is identified. The thyroid gland is unremarkable. No
axillary lymphadenopathy is seen.

Lungs/Pleura: Bilateral dependent subsegmental atelectasis is noted.
No pleural effusion or pneumothorax is seen. No masses are
identified.

Upper Abdomen: The visualized portions of the liver and spleen are
unremarkable. There is mild nodularity at the left adrenal gland,
without a well-defined mass. The visualized portions of the kidneys
are grossly unremarkable.

Musculoskeletal: No acute osseous abnormalities are identified.
There is chronic loss of height at vertebral body T6. The visualized
musculature is unremarkable in appearance. There is healed chronic
deformity of the proximal left humerus. A right humeral head
prosthesis is partially imaged.

Review of the MIP images confirms the above findings.
IMPRESSION: 1. No definite evidence of pulmonary embolus.
2. Bilateral fat sulcal atelectasis noted.  Lungs otherwise clear.
3. Scattered coronary artery calcifications.
4. Chronic loss of height at vertebral body T6.

## 2019-06-06 IMAGING — DX DG WRIST COMPLETE 3+V*L*
4 series · 4 of 4 positions shown · non-contrast
Comparison: Radiographs August 12, 2017.

CLINICAL DATA: Left wrist pain after fall 2 weeks ago.

EXAM:
LEFT WRIST - COMPLETE 3+ VIEW

[wrist ap (1 of 2)]
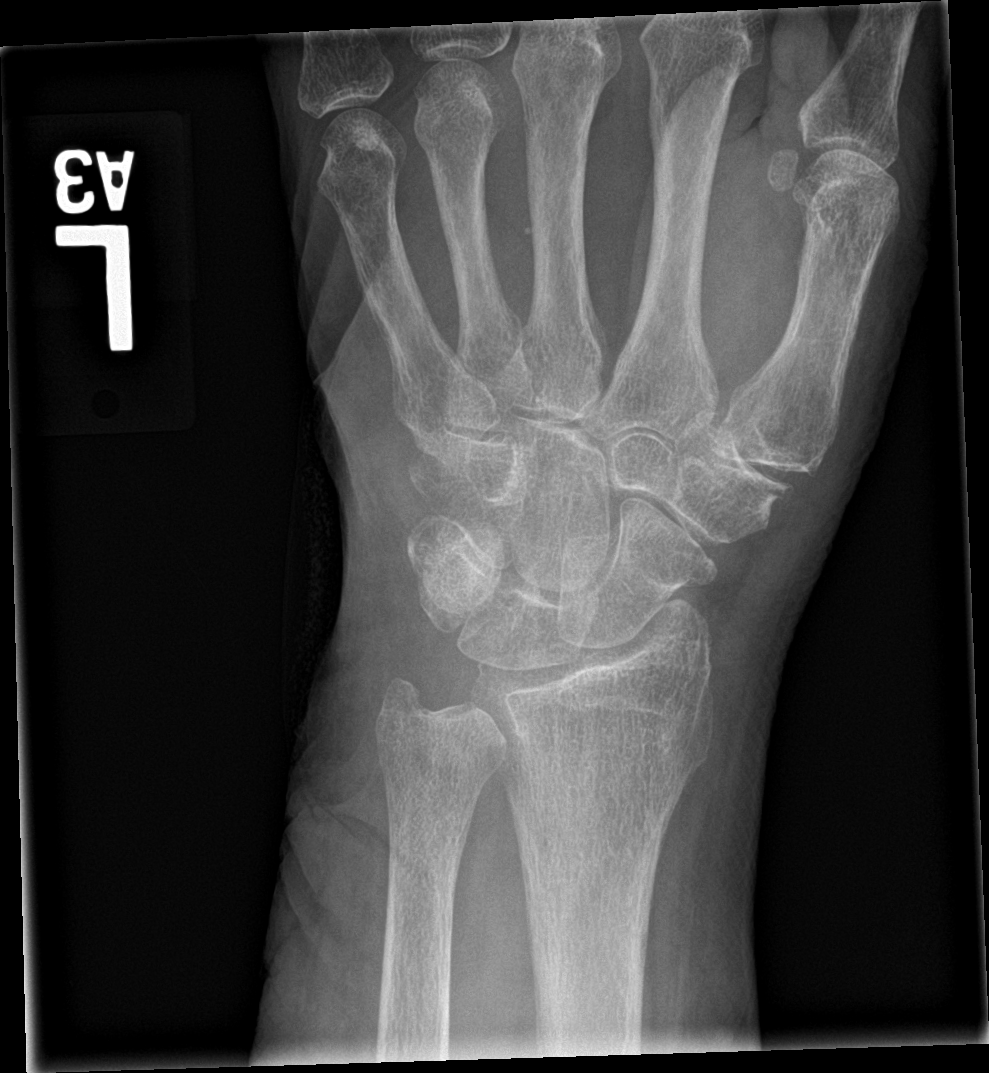

[wrist obl]
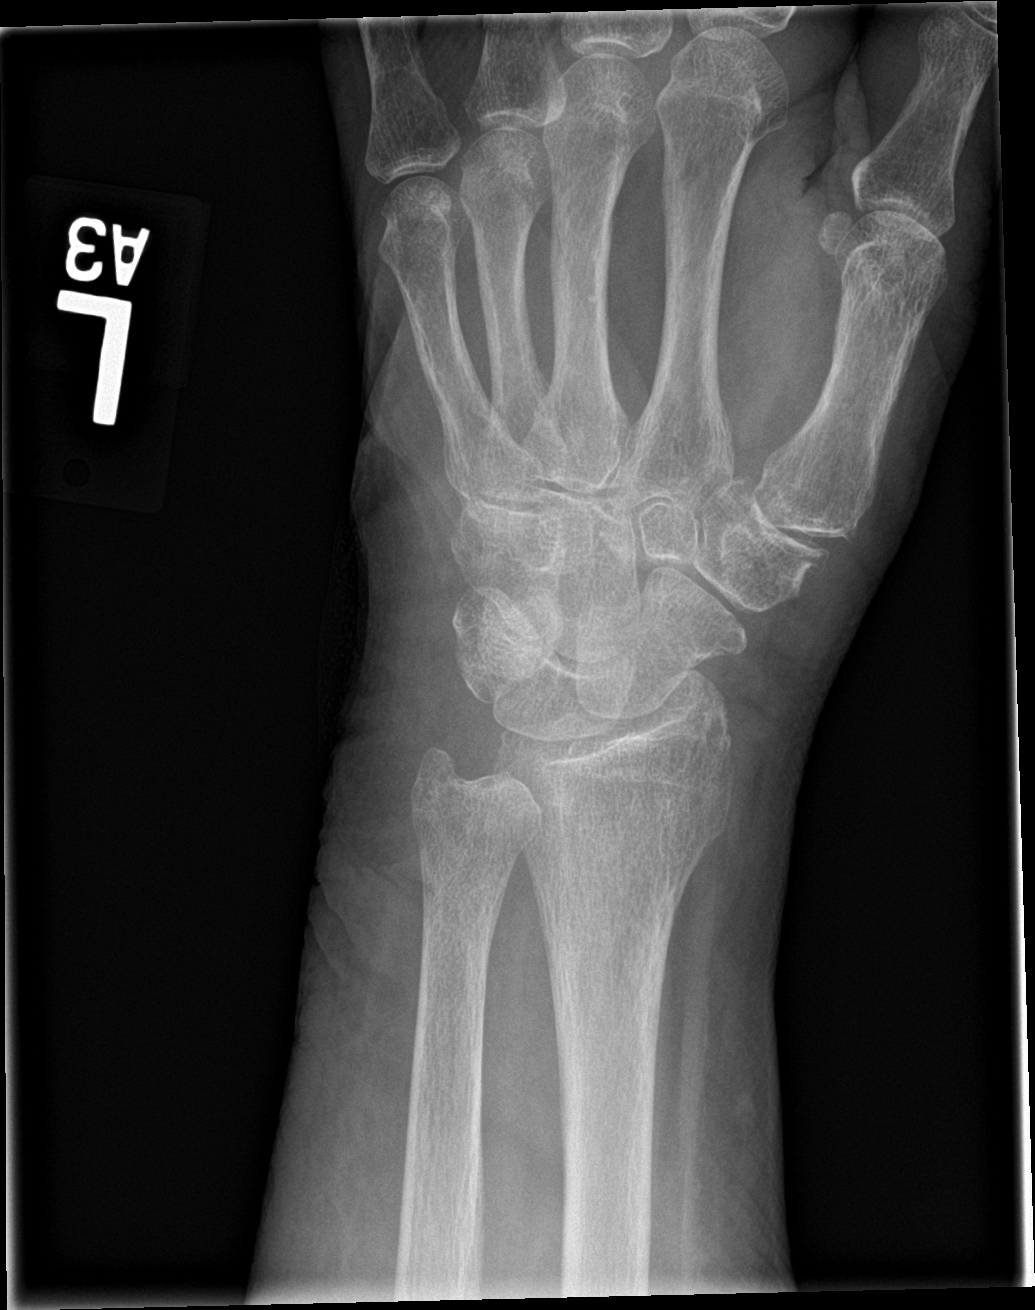

[wrist ap (2 of 2)]
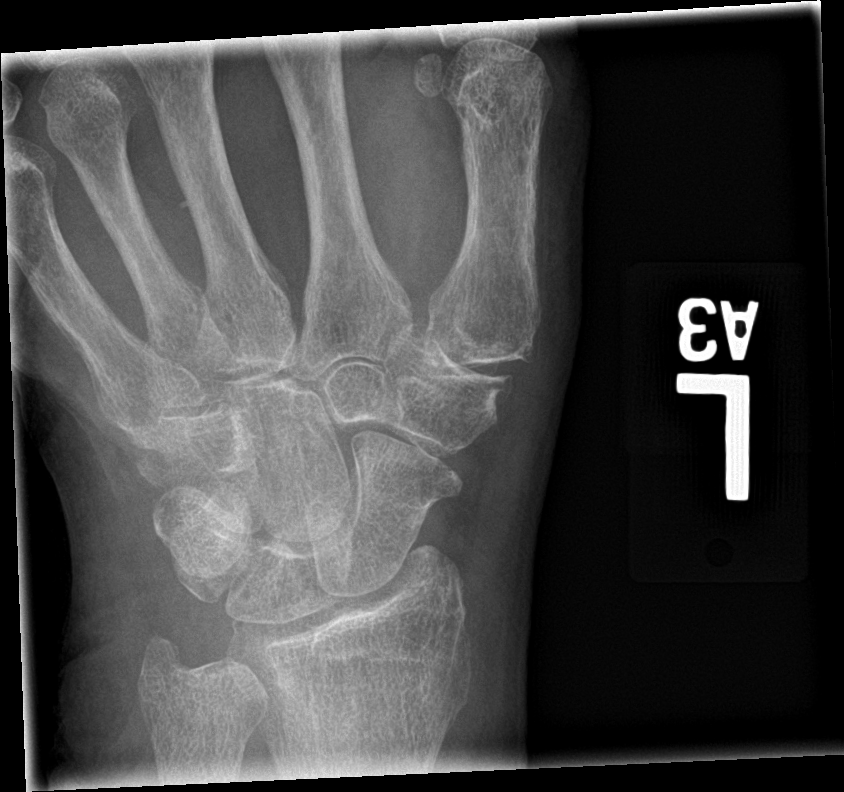

[wrist lat]
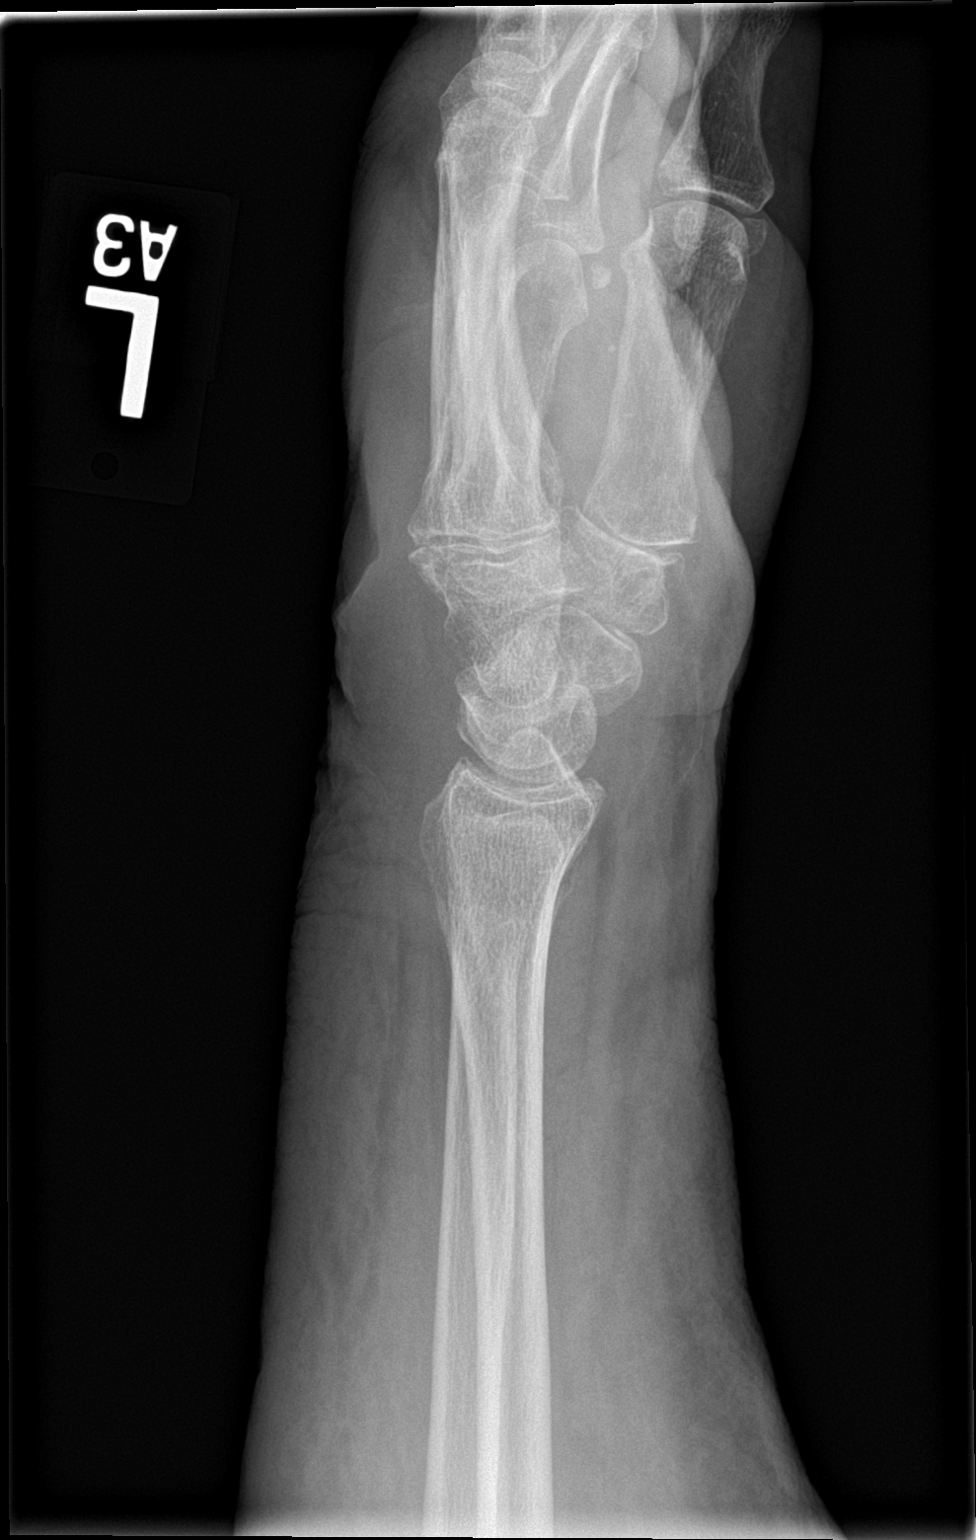

[4 of 4 positions shown; findings below may reference images not displayed]

FINDINGS: There is no evidence of fracture or dislocation. There is no
evidence of arthropathy or other focal bone abnormality. Soft
tissues are unremarkable.
IMPRESSION: No significant abnormality seen in the left wrist.

## 2019-06-07 IMAGING — DX DG CHEST 1V PORT
1 series · 1 of 1 positions shown · non-contrast
Comparison: Chest x-ray dated October 23, 2017.

CLINICAL DATA: PICC line placement.

EXAM:
PORTABLE CHEST 1 VIEW

[chest ap]
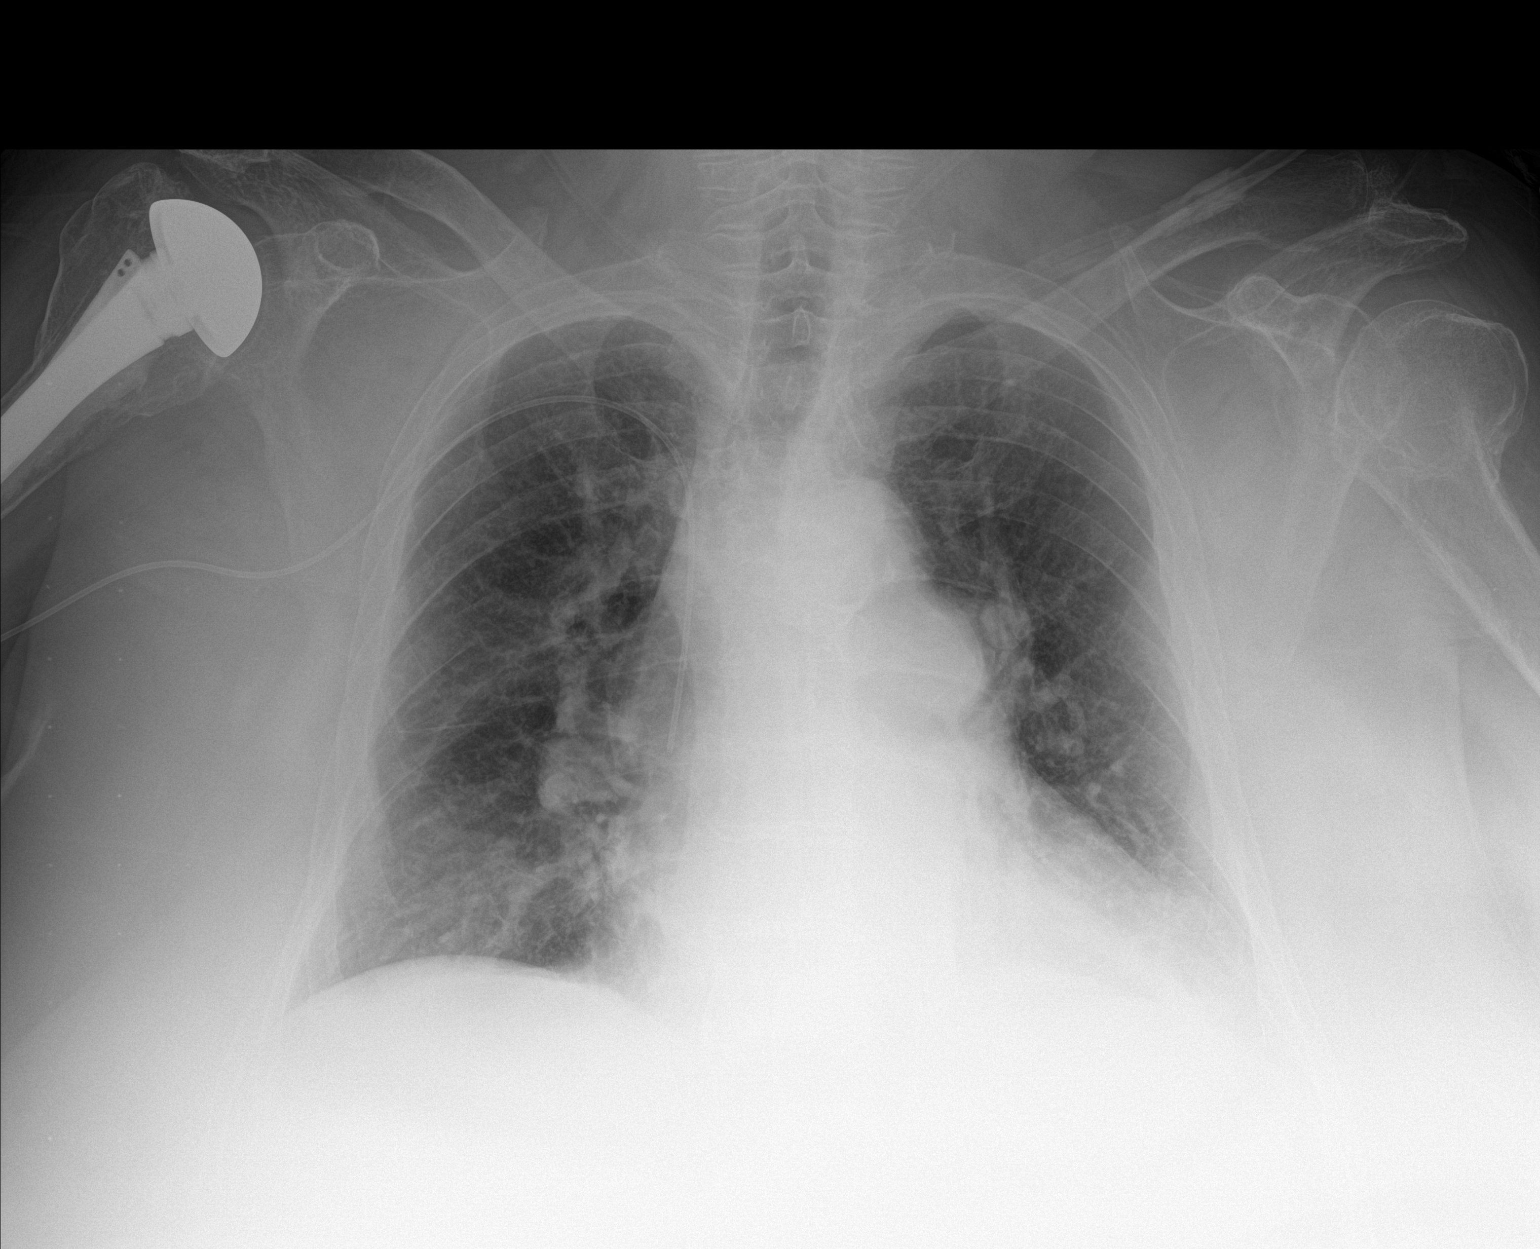

[1 of 1 positions shown; findings below may reference images not displayed]

FINDINGS: Interval placement of a right-sided PICC catheter with the tip
projecting over the cavoatrial junction. The cardiomediastinal
silhouette is normal in size. Normal pulmonary vascularity. The
prominent central pulmonary arteries. Unchanged bibasilar
atelectasis. No large pleural effusion. No pneumothorax. No acute
osseous abnormality.
IMPRESSION: 1. Right-sided PICC catheter in good position with the tip at the
cavoatrial junction.
2. Unchanged bibasilar atelectasis.

## 2019-07-05 IMAGING — DX DG CHEST 1V
1 series · 1 of 1 positions shown · non-contrast
Comparison: 10/28/2017

CLINICAL DATA: Dyspnea and respiratory distress.

EXAM:
CHEST 1 VIEW

[chest ap]
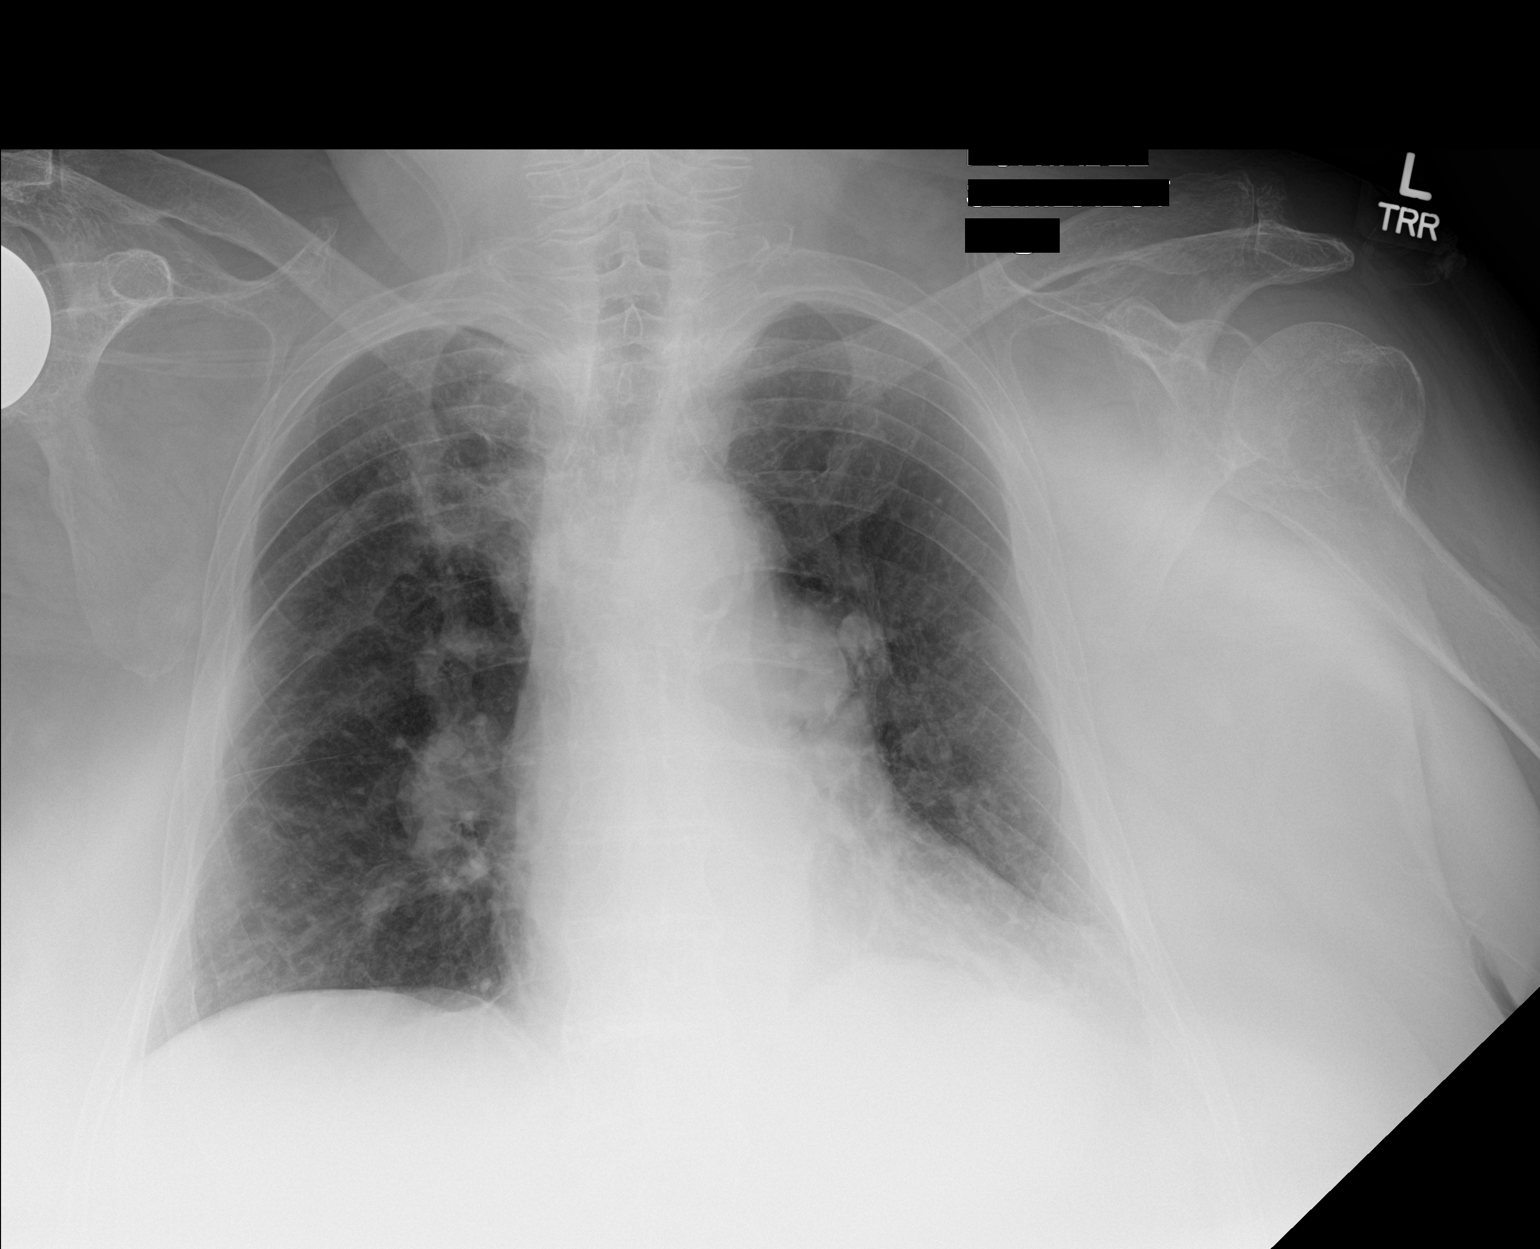

[1 of 1 positions shown; findings below may reference images not displayed]

FINDINGS: Heart is top-normal in size. There is moderate aortic
atherosclerosis. PICC line has been removed since prior exam. Stable
mild hilar prominence on the right likely vascular in etiology.
There is atelectasis at the left lung base. No overt pulmonary edema
or pneumonic consolidation. Old fracture deformity of the left
surgical neck. Partially visualized right shoulder arthroplasty.
IMPRESSION: Atelectasis at the left lung base. Aortic atherosclerosis. No active
pulmonary disease.

## 2019-07-06 IMAGING — CT CT ANGIO CHEST
2 of 6 series · 19 of 46 positions shown · IV contrast (APPLIED)
Comparison: Radiograph yesterday.  Chest CT 10/24/2017

CLINICAL DATA: Chronic dyspnea.

EXAM:
CT ANGIOGRAPHY CHEST WITH CONTRAST
TECHNIQUE: Multidetector CT imaging of the chest was performed using the
standard protocol during bolus administration of intravenous
contrast. Multiplanar CT image reconstructions and MIPs were
obtained to evaluate the vascular anatomy.
CONTRAST:  75mL [id number] IOPAMIDOL ([id number]-AI0) INJECTION 76%

[Series 5: thins · axial · 0.71mm/px · z∈[-492,-261]mm · 16 of 253 slices shown]
[im 11/253  lung]
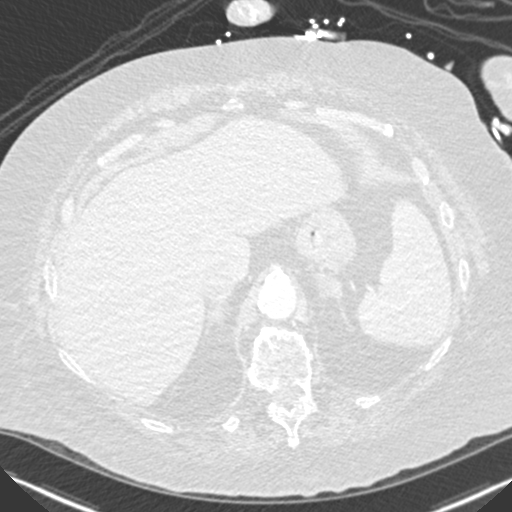
[im 33/253  soft-tissue]
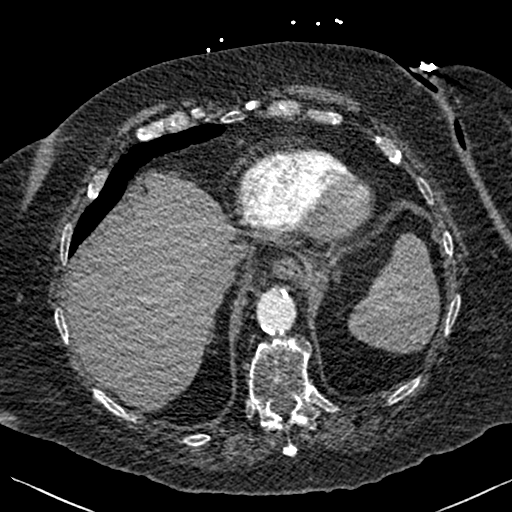
[im 44/253  lung]
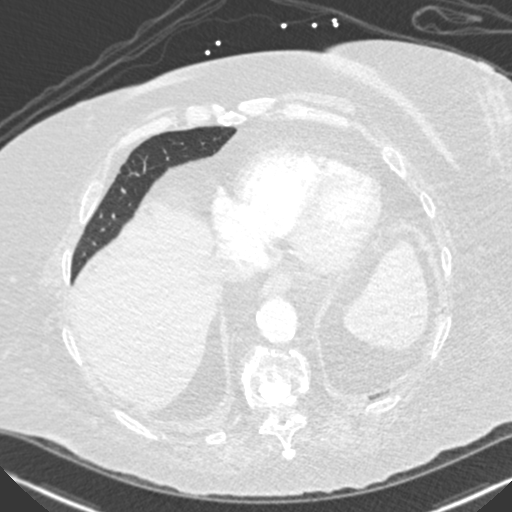
[im 55/253  soft-tissue]
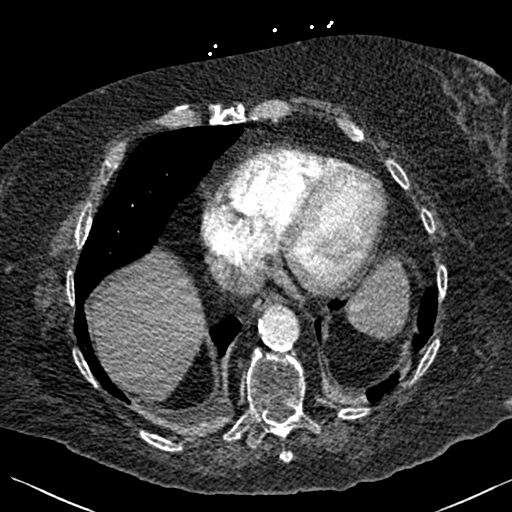
[im 77/253  lung]
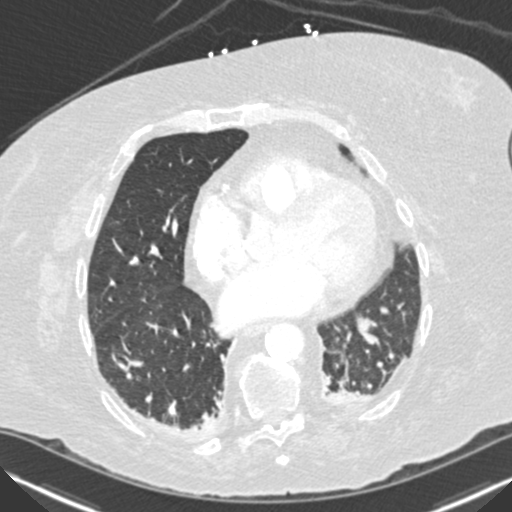
[im 88/253  soft-tissue]
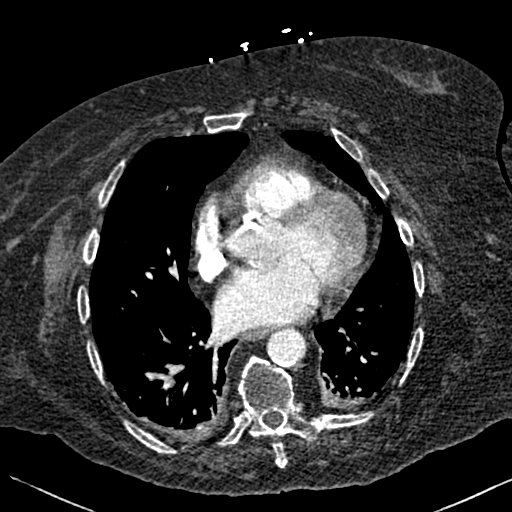
[im 99/253  lung]
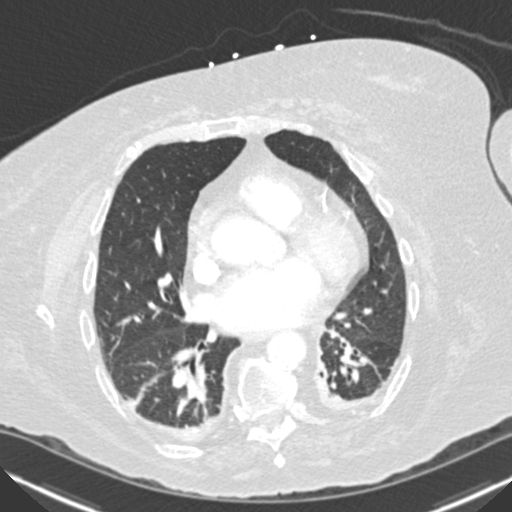
[im 121/253  soft-tissue]
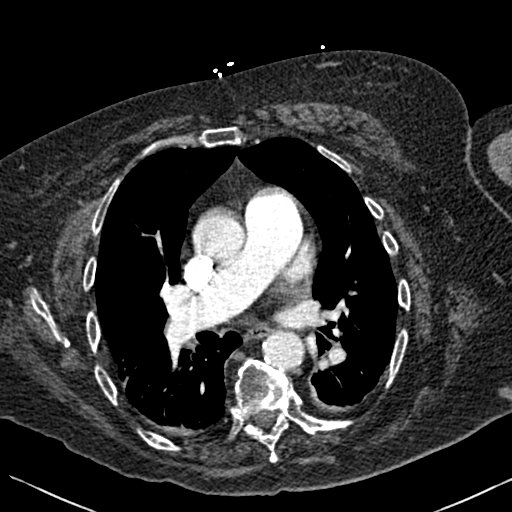
[im 132/253  lung]
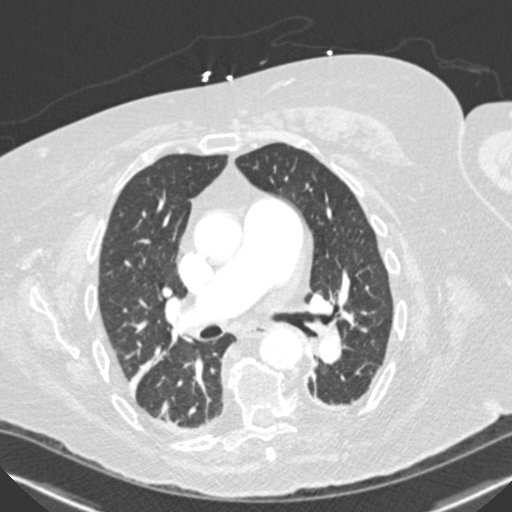
[im 154/253  soft-tissue]
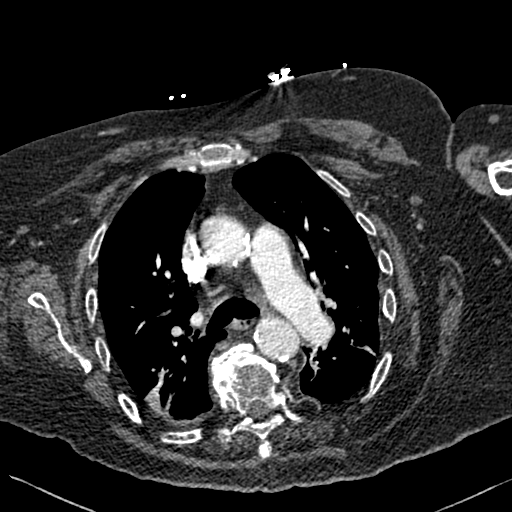
[im 165/253  lung]
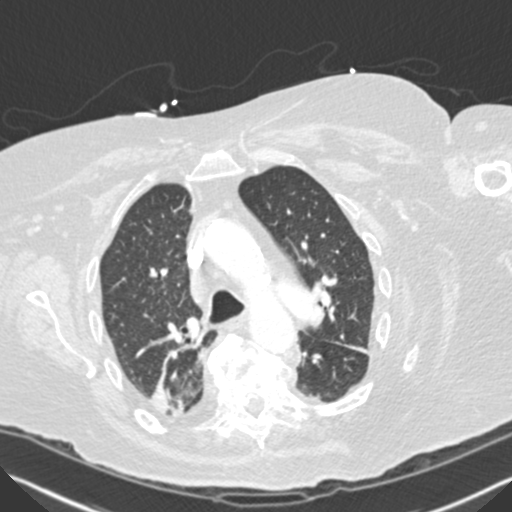
[im 176/253  soft-tissue]
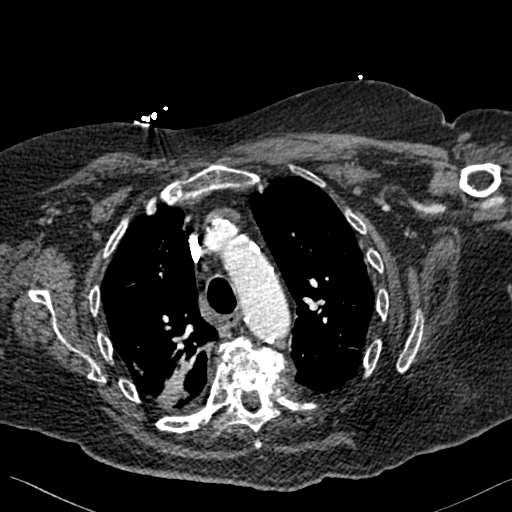
[im 198/253  lung]
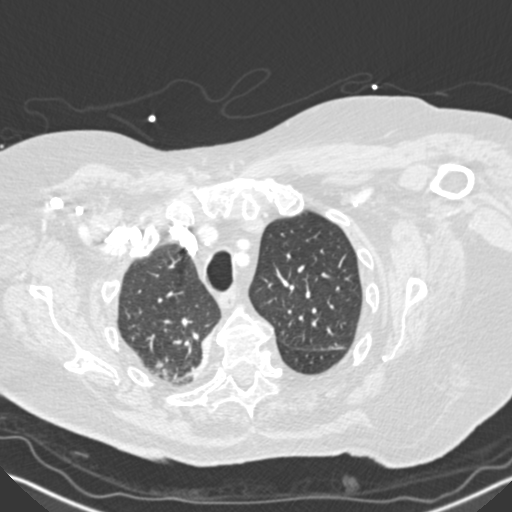
[im 209/253  soft-tissue]
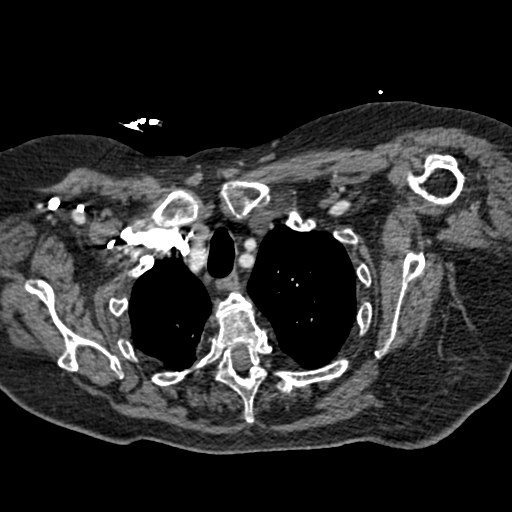
[im 220/253  lung]
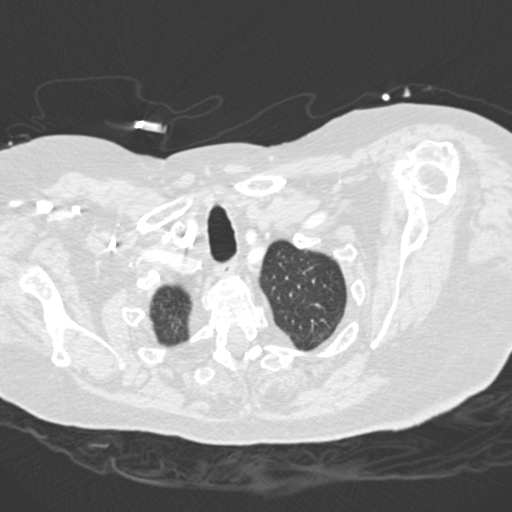
[im 242/253  soft-tissue]
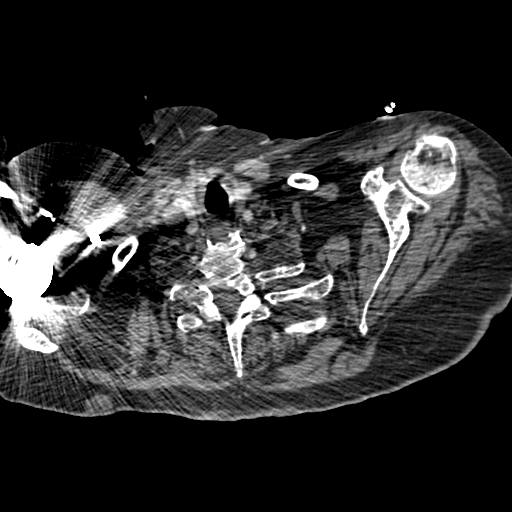

[Series 7: coronal mpr · coronal · 0.61mm/px · 3 of 95 slices shown]
[im 24/95  soft-tissue]
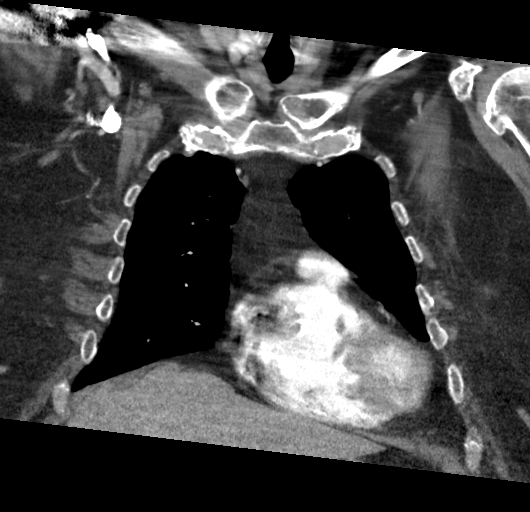
[im 48/95  soft-tissue]
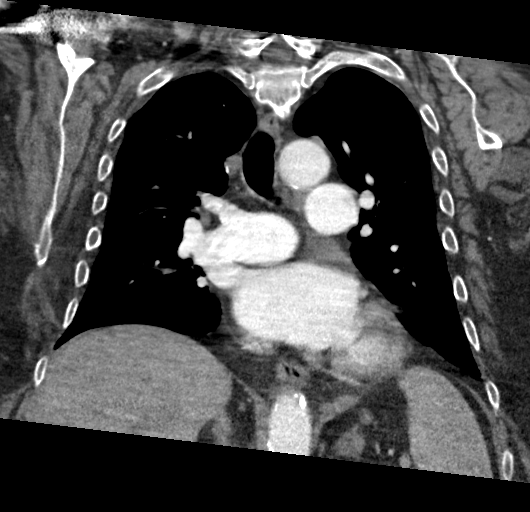
[im 71/95  soft-tissue]
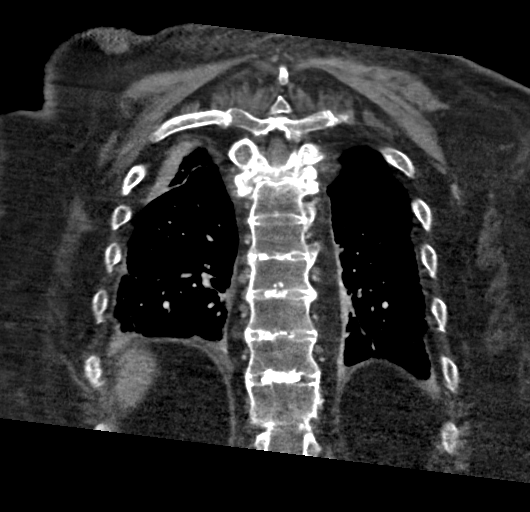

[19 of 46 positions shown; findings below may reference images not displayed]

FINDINGS: Cardiovascular: There are no filling defects within the pulmonary
arteries to suggest pulmonary embolus. Dilated main pulmonary artery
measuring 4.2 cm. Tortuous aorta with atherosclerosis. No aortic
dissection. There are coronary artery calcifications. Heart size
upper normal.

Mediastinum/Nodes: No enlarged mediastinal or hilar nodes. No
axillary adenopathy. The esophagus is decompressed. No visualized
thyroid nodule.

Lungs/Pleura: Dependent right upper lobe atelectasis with some
improvement from prior exam. Persistent but improved bilateral lower
lobe atelectasis. No new airspace disease. No pulmonary edema. Trace
right pleural effusion.

Upper Abdomen: No acute abnormality. Unchanged bilateral adrenal
nodularity.

Musculoskeletal: Unchanged T5 and T6 vertebral body fractures with
unchanged retropulsion of T6. Remote right anterior rib fractures.

Review of the MIP images confirms the above findings.
IMPRESSION: 1. No pulmonary embolus.
2. Dilated main pulmonary arteries consistent with pulmonary
arterial hypertension.
3. Aortic atherosclerosis and coronary artery calcifications.
4. Multifocal atelectasis with some improvement from prior CT.

Aortic Atherosclerosis ([id number]).

## 2019-07-08 IMAGING — CT CT T SPINE W/O CM
3 series · 14 of 33 positions shown, 17 images · non-contrast
Comparison: Chest CT scans 08/12/2017, 09/11/2017, 10/24/2017 and
11/26/2017.

CLINICAL DATA: History of a fall in August 2017 with persistent
mid back pain.

EXAM:
CT THORACIC SPINE WITHOUT CONTRAST
TECHNIQUE: Multidetector CT images of the thoracic were obtained using the
standard protocol without intravenous contrast.

[Series 4: sagittal bone · sagittal · 0.65mm/px · 5 of 67 slices shown, 6 images]
[im 23/67  bone]
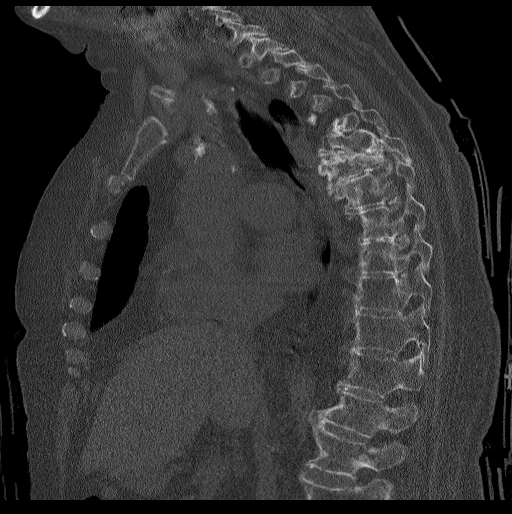
[im 28/67  bone]
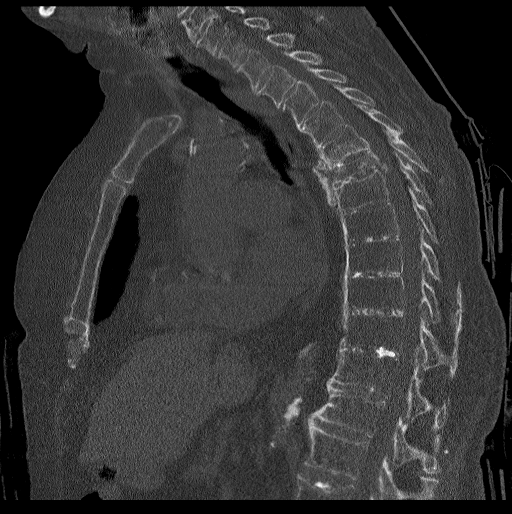
[im 34/67  soft-tissue]
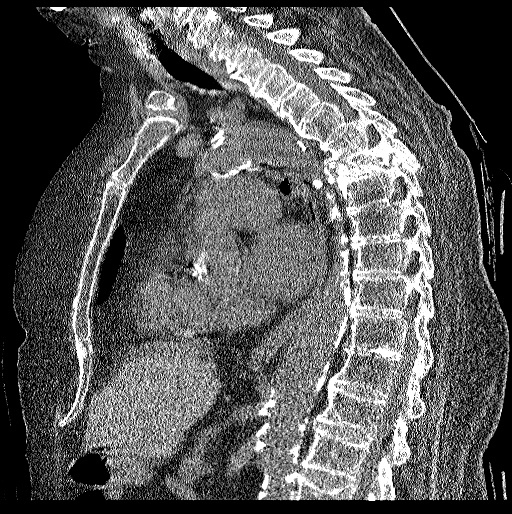
[im 34/67  bone]
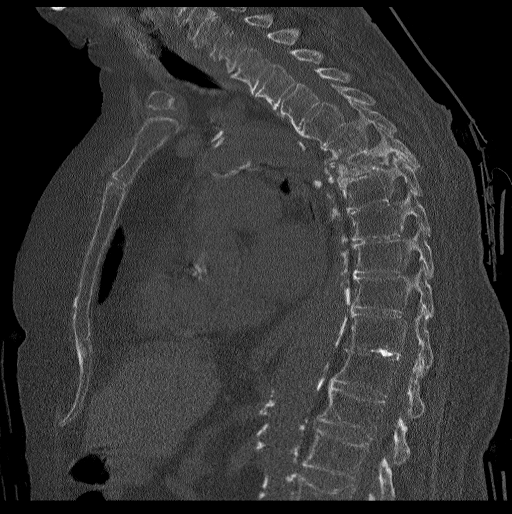
[im 39/67  bone]
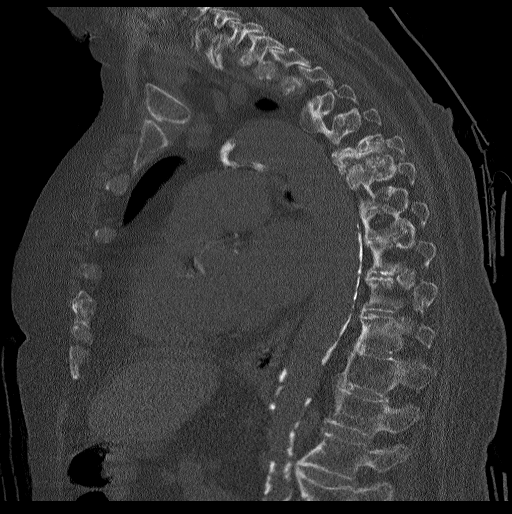
[im 45/67  bone]
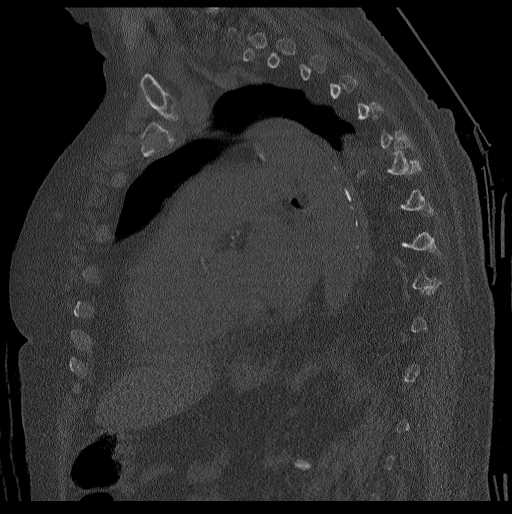

[Series 5: t spine soft · axial · 0.50mm/px · z∈[+314,+560]mm · 6 of 161 slices shown, 8 images]
[im 25/161  soft-tissue]
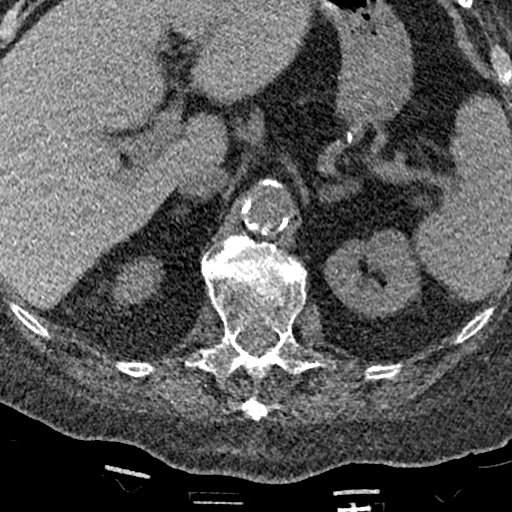
[im 25/161  bone]
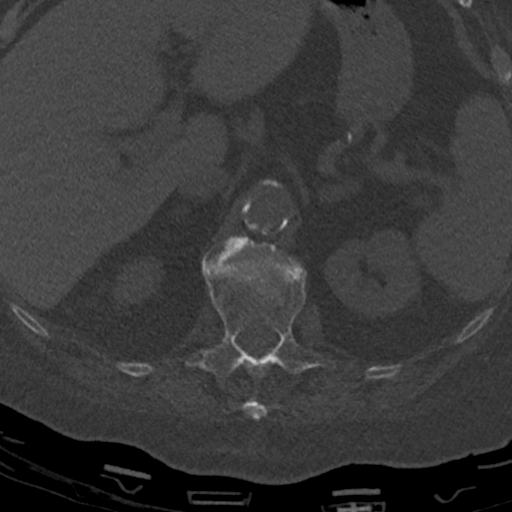
[im 50/161  bone]
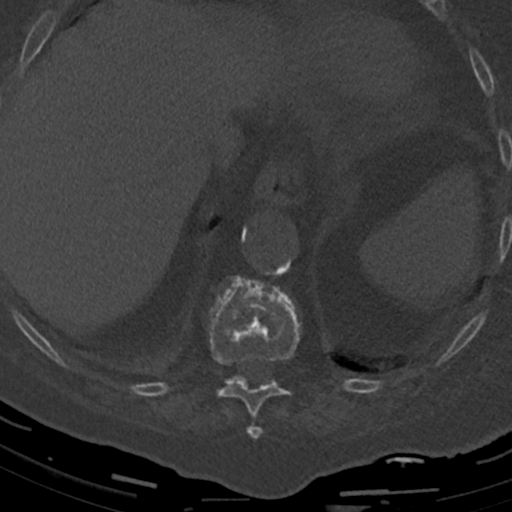
[im 74/161  bone]
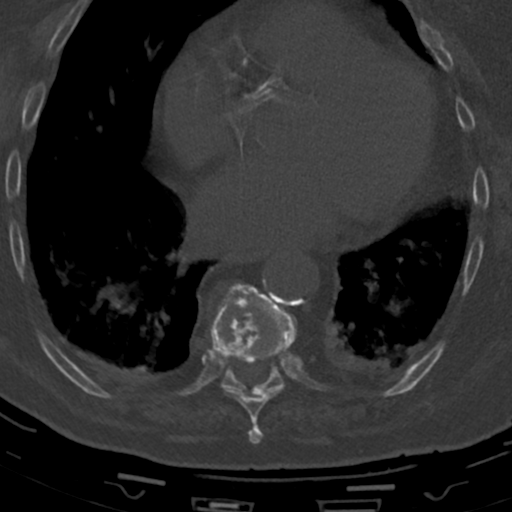
[im 99/161  bone]
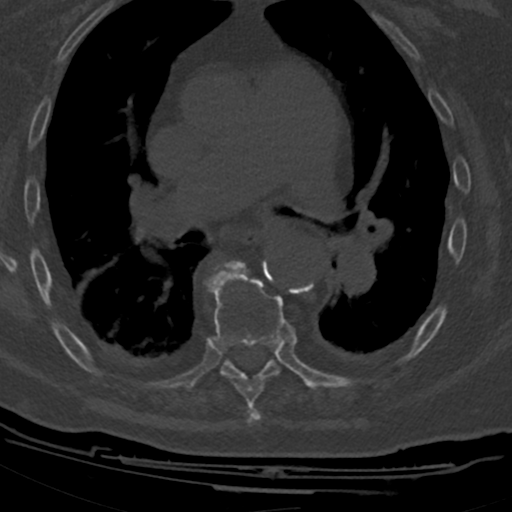
[im 124/161  soft-tissue]
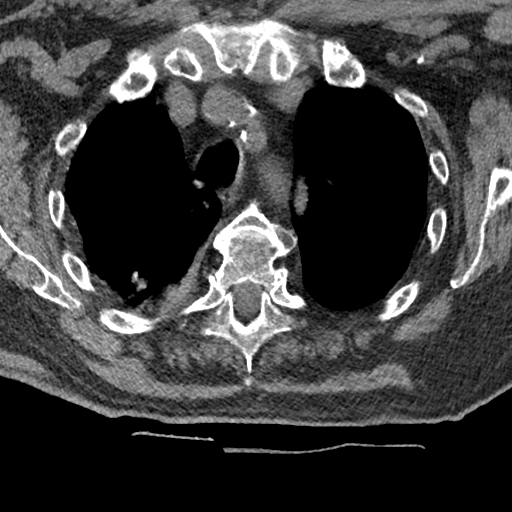
[im 124/161  bone]
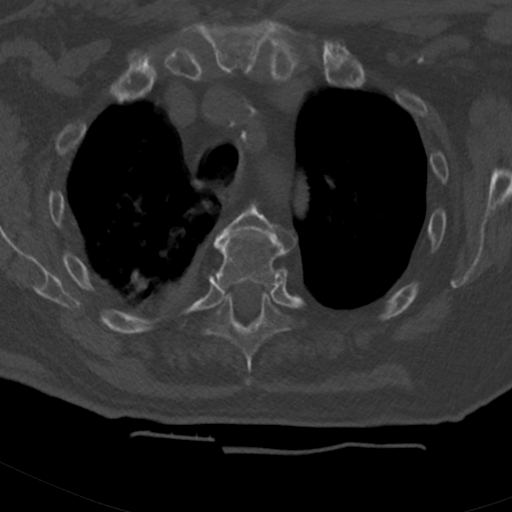
[im 148/161  bone]
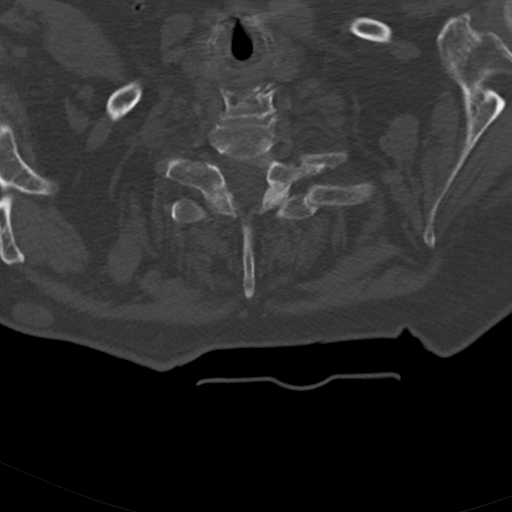

[Series 6: coronal bone · coronal · 0.41mm/px · 3 of 110 slices shown]
[im 22/110  bone]
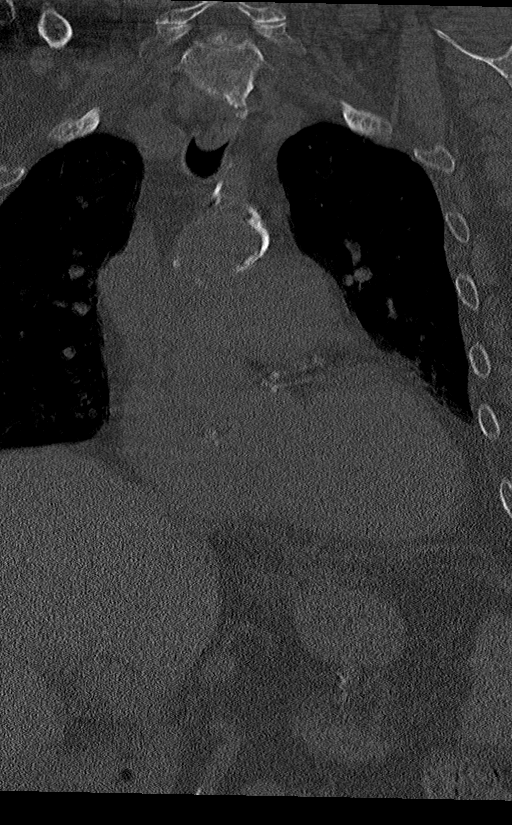
[im 44/110  bone]
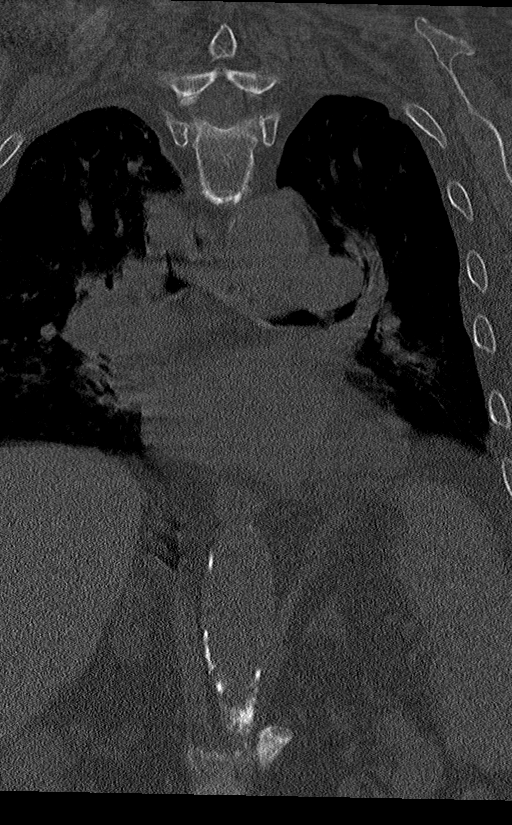
[im 66/110  bone]
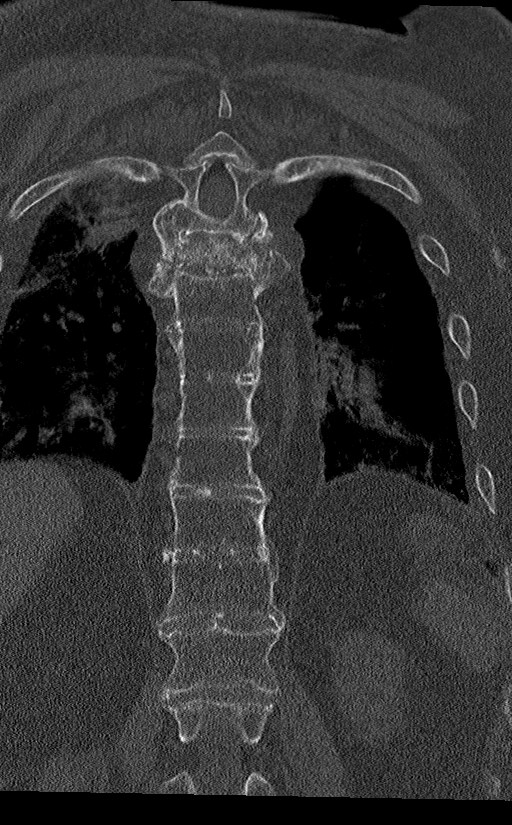

[14 of 33 positions shown; findings below may reference images not displayed]

FINDINGS: Alignment: Exaggerated thoracic kyphosis but the overall alignment
is maintained. Suspect changes of ankylosing spondylitis.

Vertebrae: Compression fracture of T6, progressive since prior
studies with estimated 50% compression deformity. There is anterior
bony protrusion and posterior retropulsion. The posterior
retropulsion appears stable and there is no significant canal
compromise. Extensive healing changes are noted anteriorly with
attempted bridging callus formation. Some of this also could be
calcifying hematoma. No new compression fractures identified.
Suspect ankylosing spondylitis.

Paraspinal and other soft tissues: No significant findings.
Prominent pleural fat noted. There are small bilateral pleural
effusions and bibasilar atelectasis. Three-vessel coronary artery
calcifications are noted along with dense aortic calcifications.

Disc levels: No significant thoracic disc protrusions, spinal or
foraminal stenosis. Mild spinal encroachment at the fracture level
but no significant stenosis.
IMPRESSION: 1. The significant compression fracture of T6 with progressive
compression deformity since [REDACTED]. Stable mild retropulsion but
no canal compromise.
2. No new/acute thoracic compression fractures.
3. Suspect ankylosing spondylitis.
4. Small bilateral pleural effusions and bibasilar atelectasis.

## 2019-07-24 IMAGING — DX DG SHOULDER 2+V*L*
3 series · 3 of 3 positions shown · non-contrast
Comparison: CXR dating back to 09/11/2017

CLINICAL DATA: Left arm pain. Patient fell in [REDACTED] and broke are
left arm.

EXAM:
LEFT SHOULDER - 2+ VIEW

[shoulder axial]
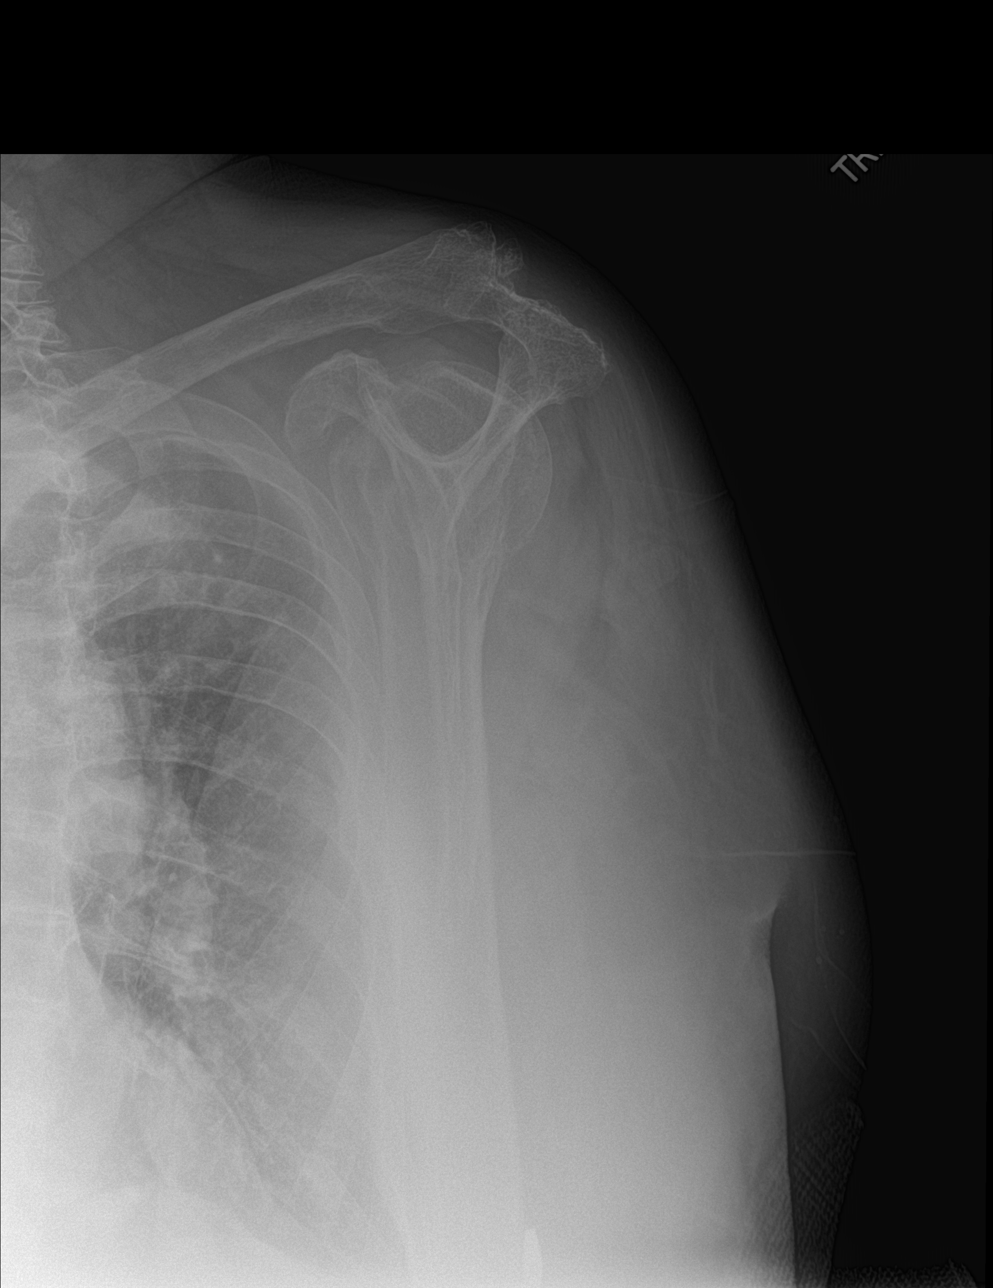

[shoulder ap]
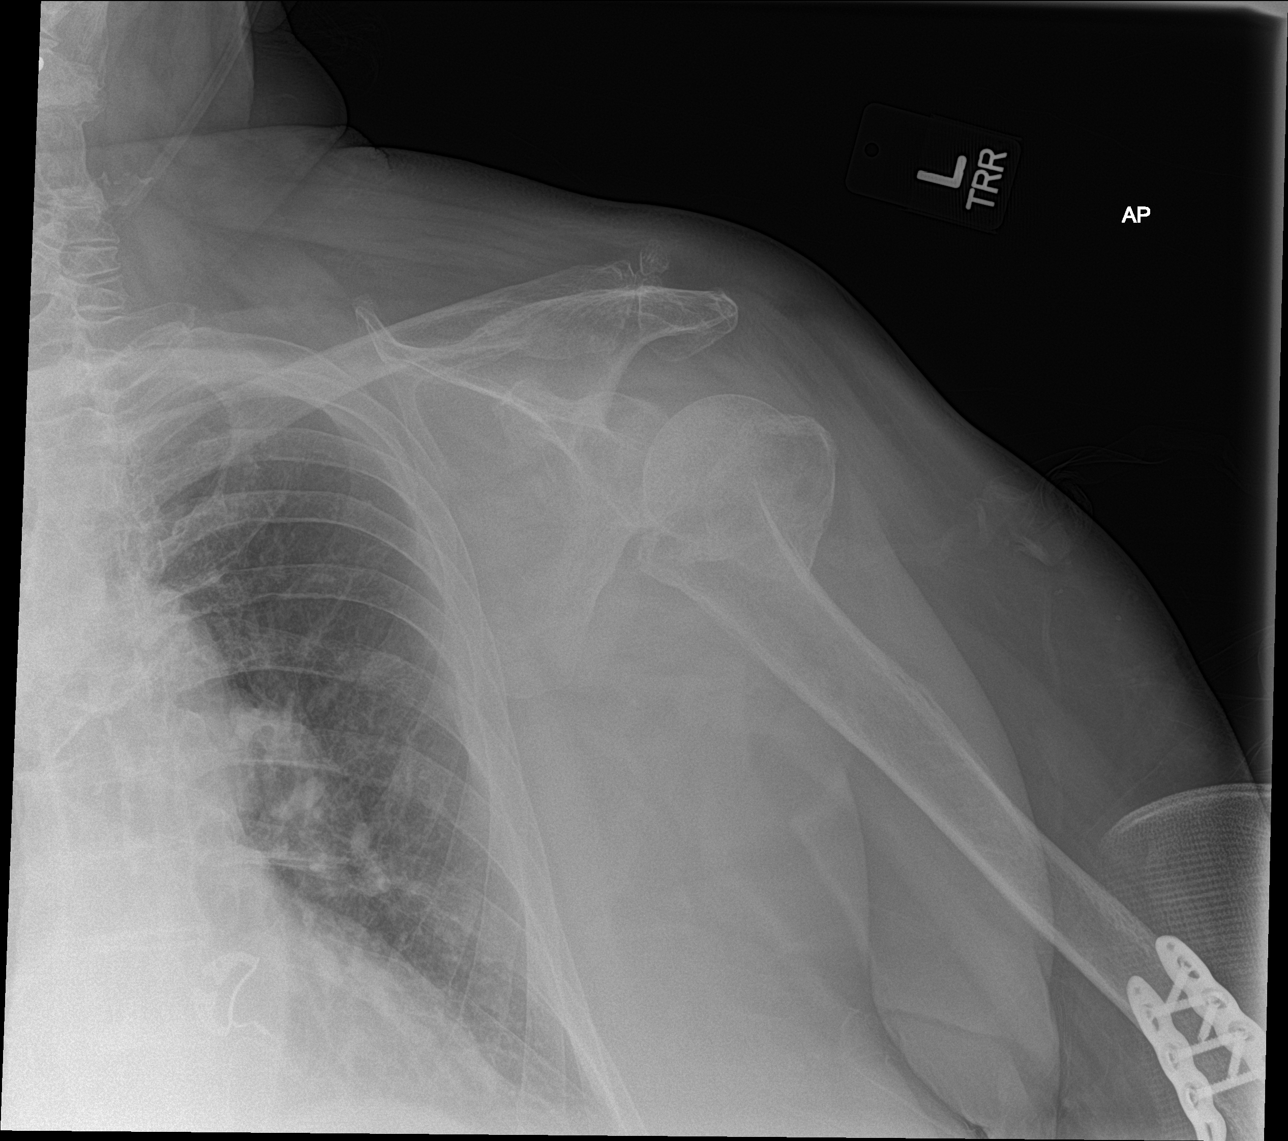

[shoulder obl]
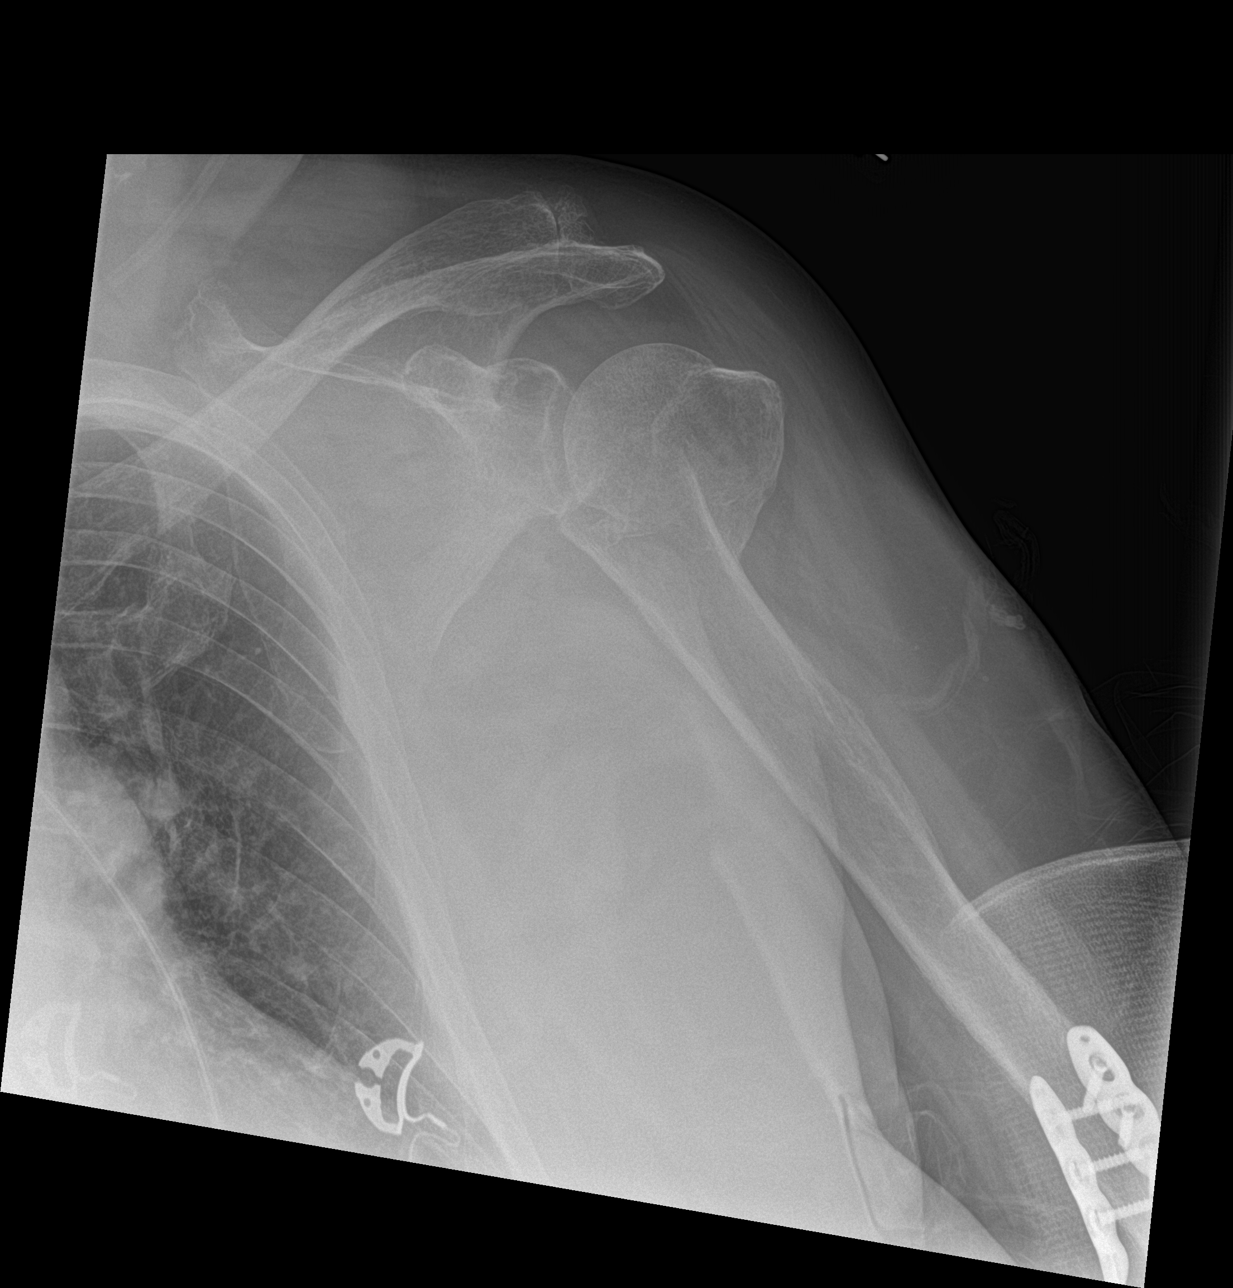

[3 of 3 positions shown; findings below may reference images not displayed]

FINDINGS: Chronic surgical neck fracture of the left humerus without change.
No joint dislocations. Mild AC joint osteoarthritis. Partially
imaged plate and screw fixation of the distal left humerus. The
adjacent ribs and lung are nonacute in appearance.
IMPRESSION: No acute osseous abnormality. Re- demonstration of chronic fracture
deformity of the left surgical neck of the humerus as well as plate
and screw fixation of the included distal humerus.

## 2019-07-24 IMAGING — DX DG CHEST 1V
1 series · 1 of 1 positions shown · non-contrast
Comparison: 11/25/2017 CXR and 11/28/2017 CT

CLINICAL DATA: Dyspnea

EXAM:
CHEST 1 VIEW

[chest ap]
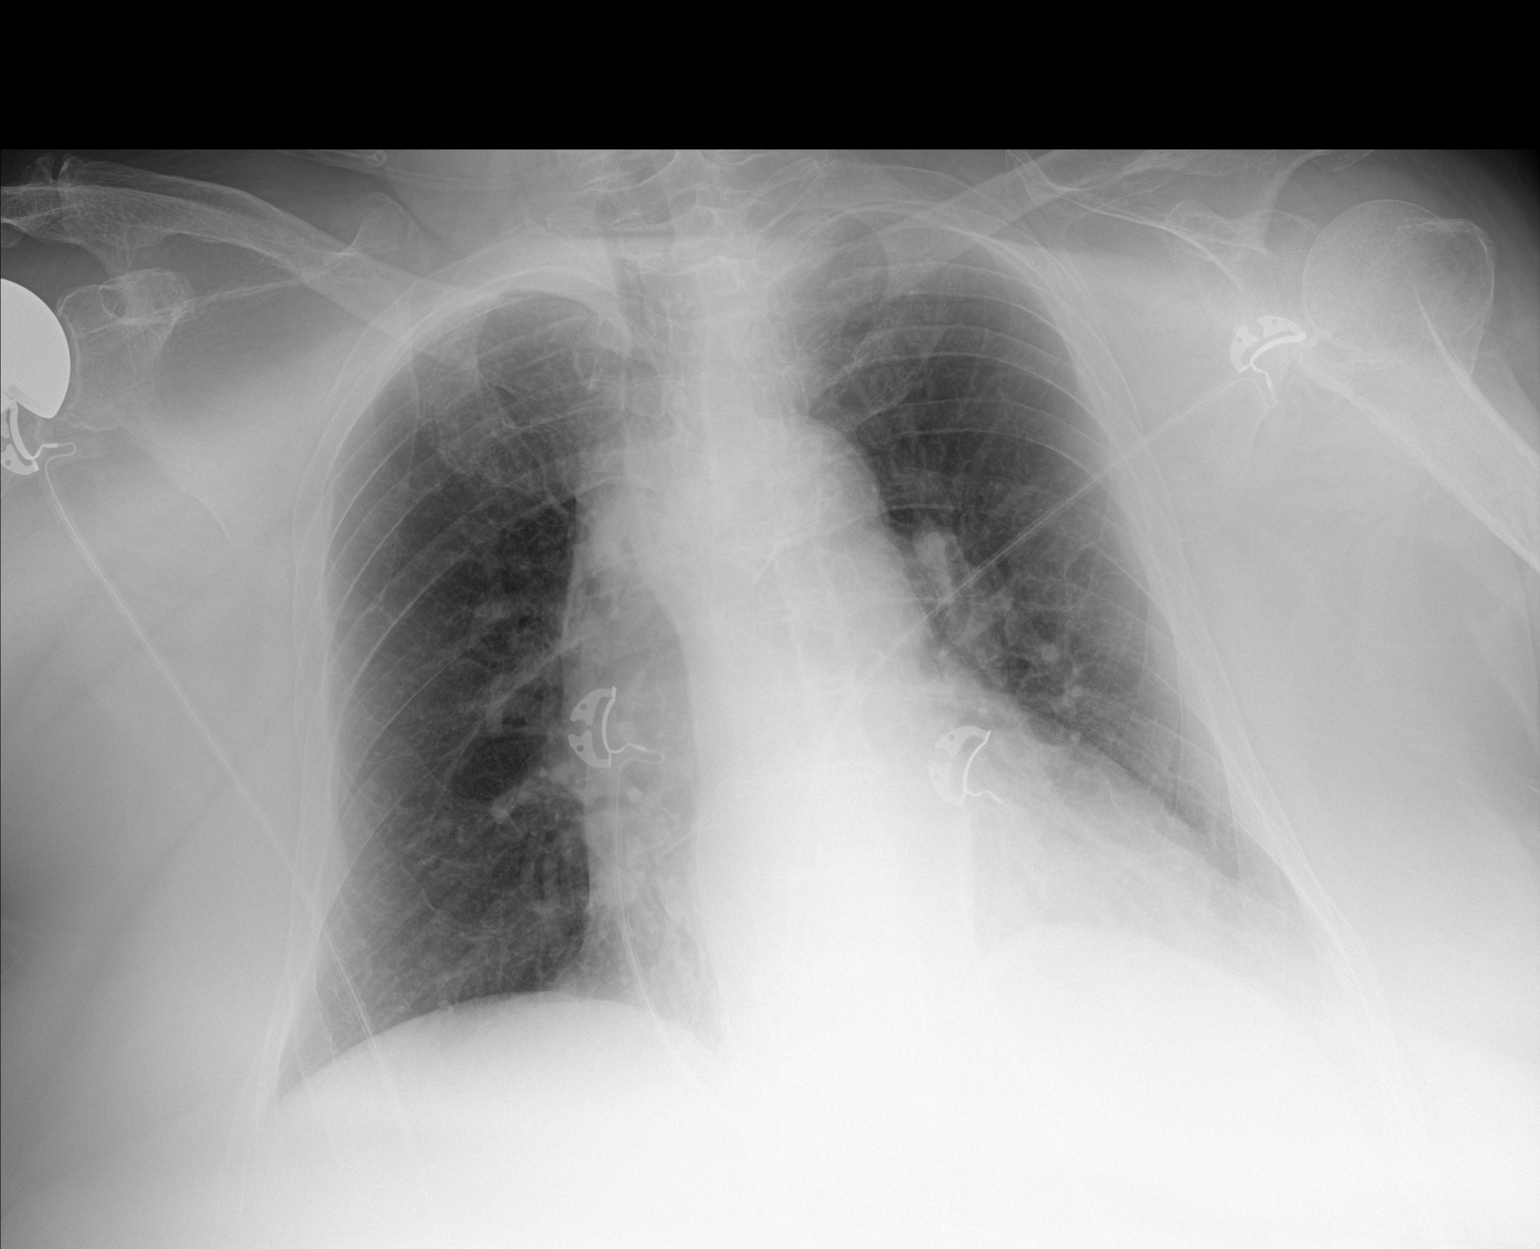

[1 of 1 positions shown; findings below may reference images not displayed]

FINDINGS: Stable cardiomegaly with aortic atherosclerosis. No pneumonic
consolidation or CHF. Chronic fracture deformity of the left humeral
surgical neck. Partially imaged right shoulder arthroplasty.
IMPRESSION: Stable cardiomegaly with aortic atherosclerosis.  No active disease.

## 2019-08-04 IMAGING — DX DG CHEST 1V PORT
1 series · 1 of 1 positions shown · non-contrast
Comparison: 12/14/2017

CLINICAL DATA: Hx of cardiomyopathy, CKD, COPD, depression,
diabetes, hypertension, presents to the emergency department for
shortness of breath cough and congestion. Known COPD, diabetes,
CHF,CKD, HTN

EXAM:
PORTABLE CHEST 1 VIEW

[chest ap]
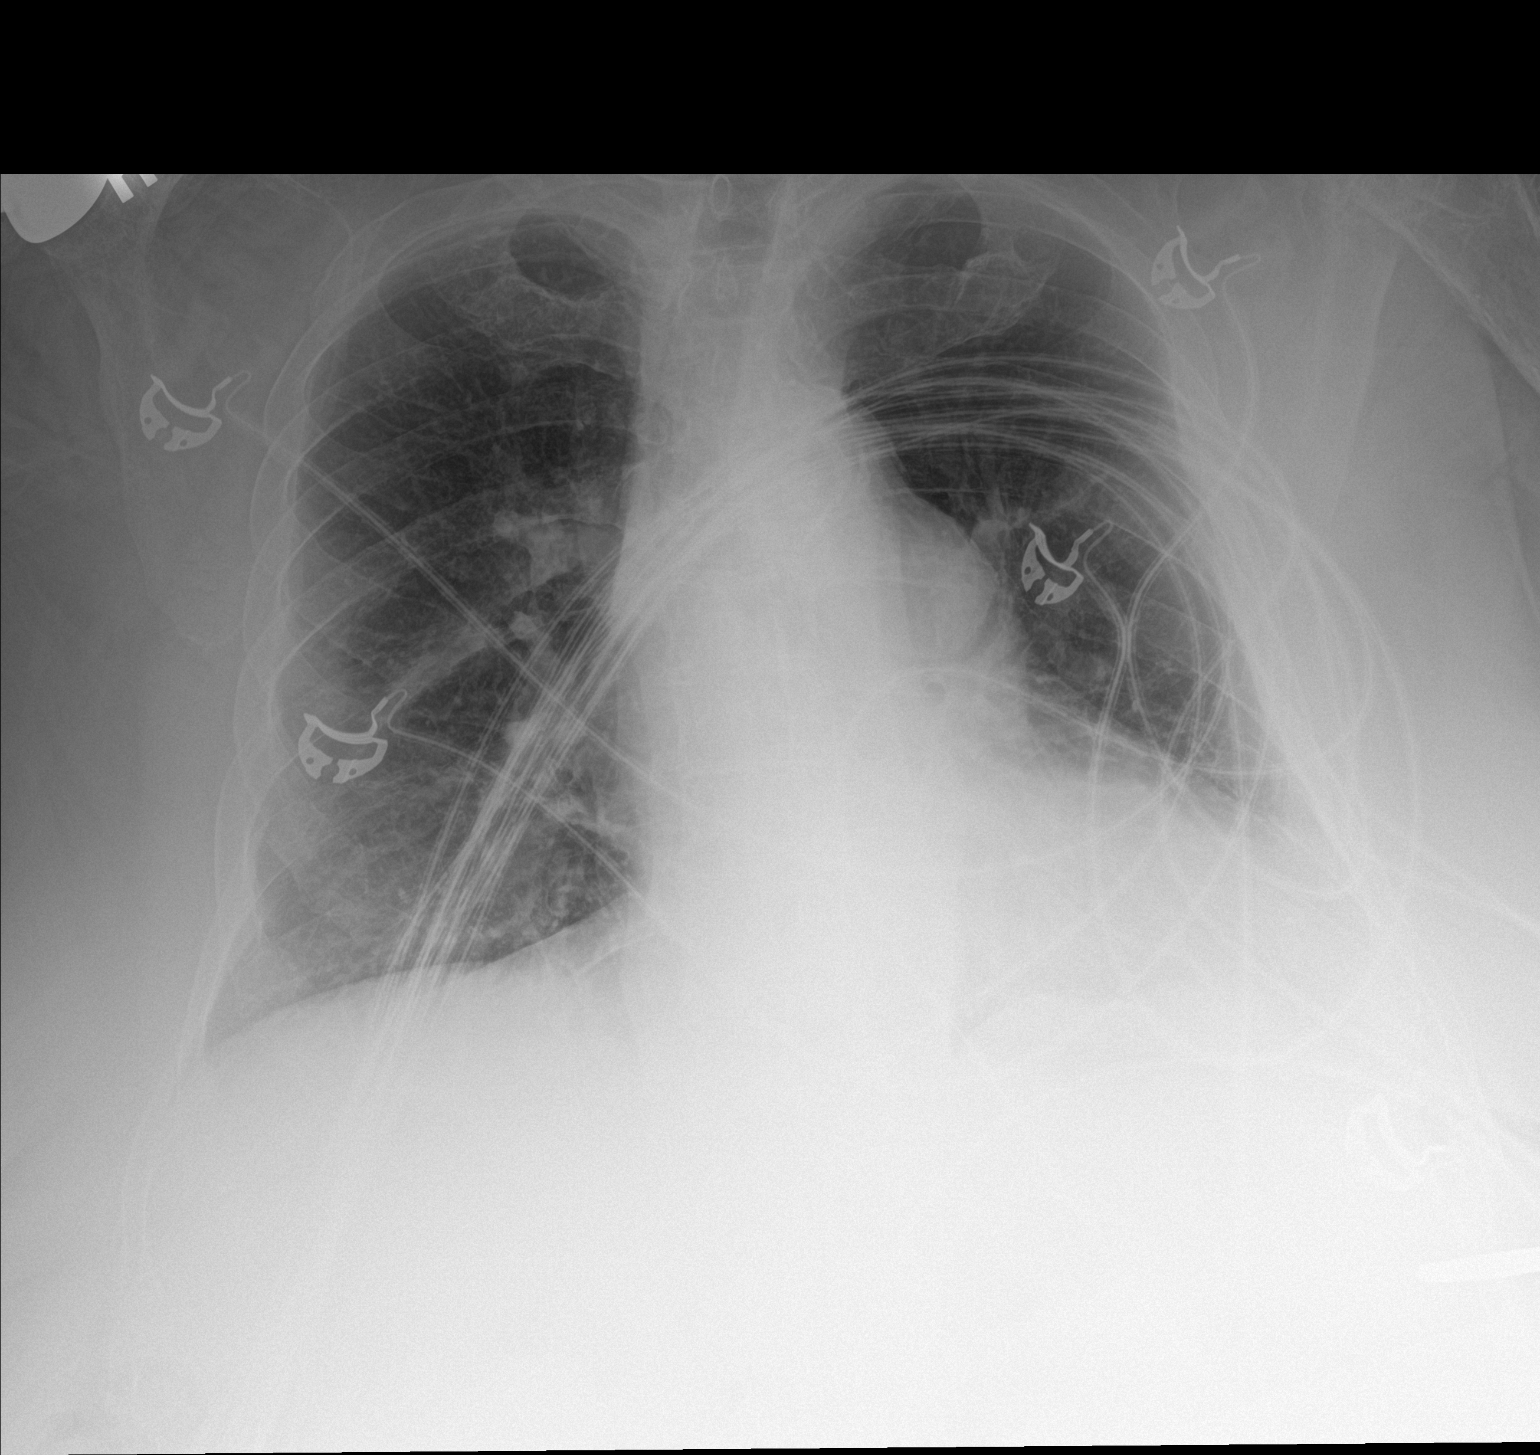

[1 of 1 positions shown; findings below may reference images not displayed]

FINDINGS: Mildly degraded exam due to AP portable technique and patient body
habitus. Midline trachea. Cardiomegaly accentuated by AP portable
technique. No pleural effusion or pneumothorax. No congestive
failure. Right shoulder arthroplasty.
IMPRESSION: Cardiomegaly without congestive failure.

## 2019-08-14 IMAGING — DX DG CHEST 1V PORT
1 series · 1 of 1 positions shown · non-contrast
Comparison: 12/25/2017

CLINICAL DATA: Dyspnea

EXAM:
PORTABLE CHEST 1 VIEW

[chest ap]
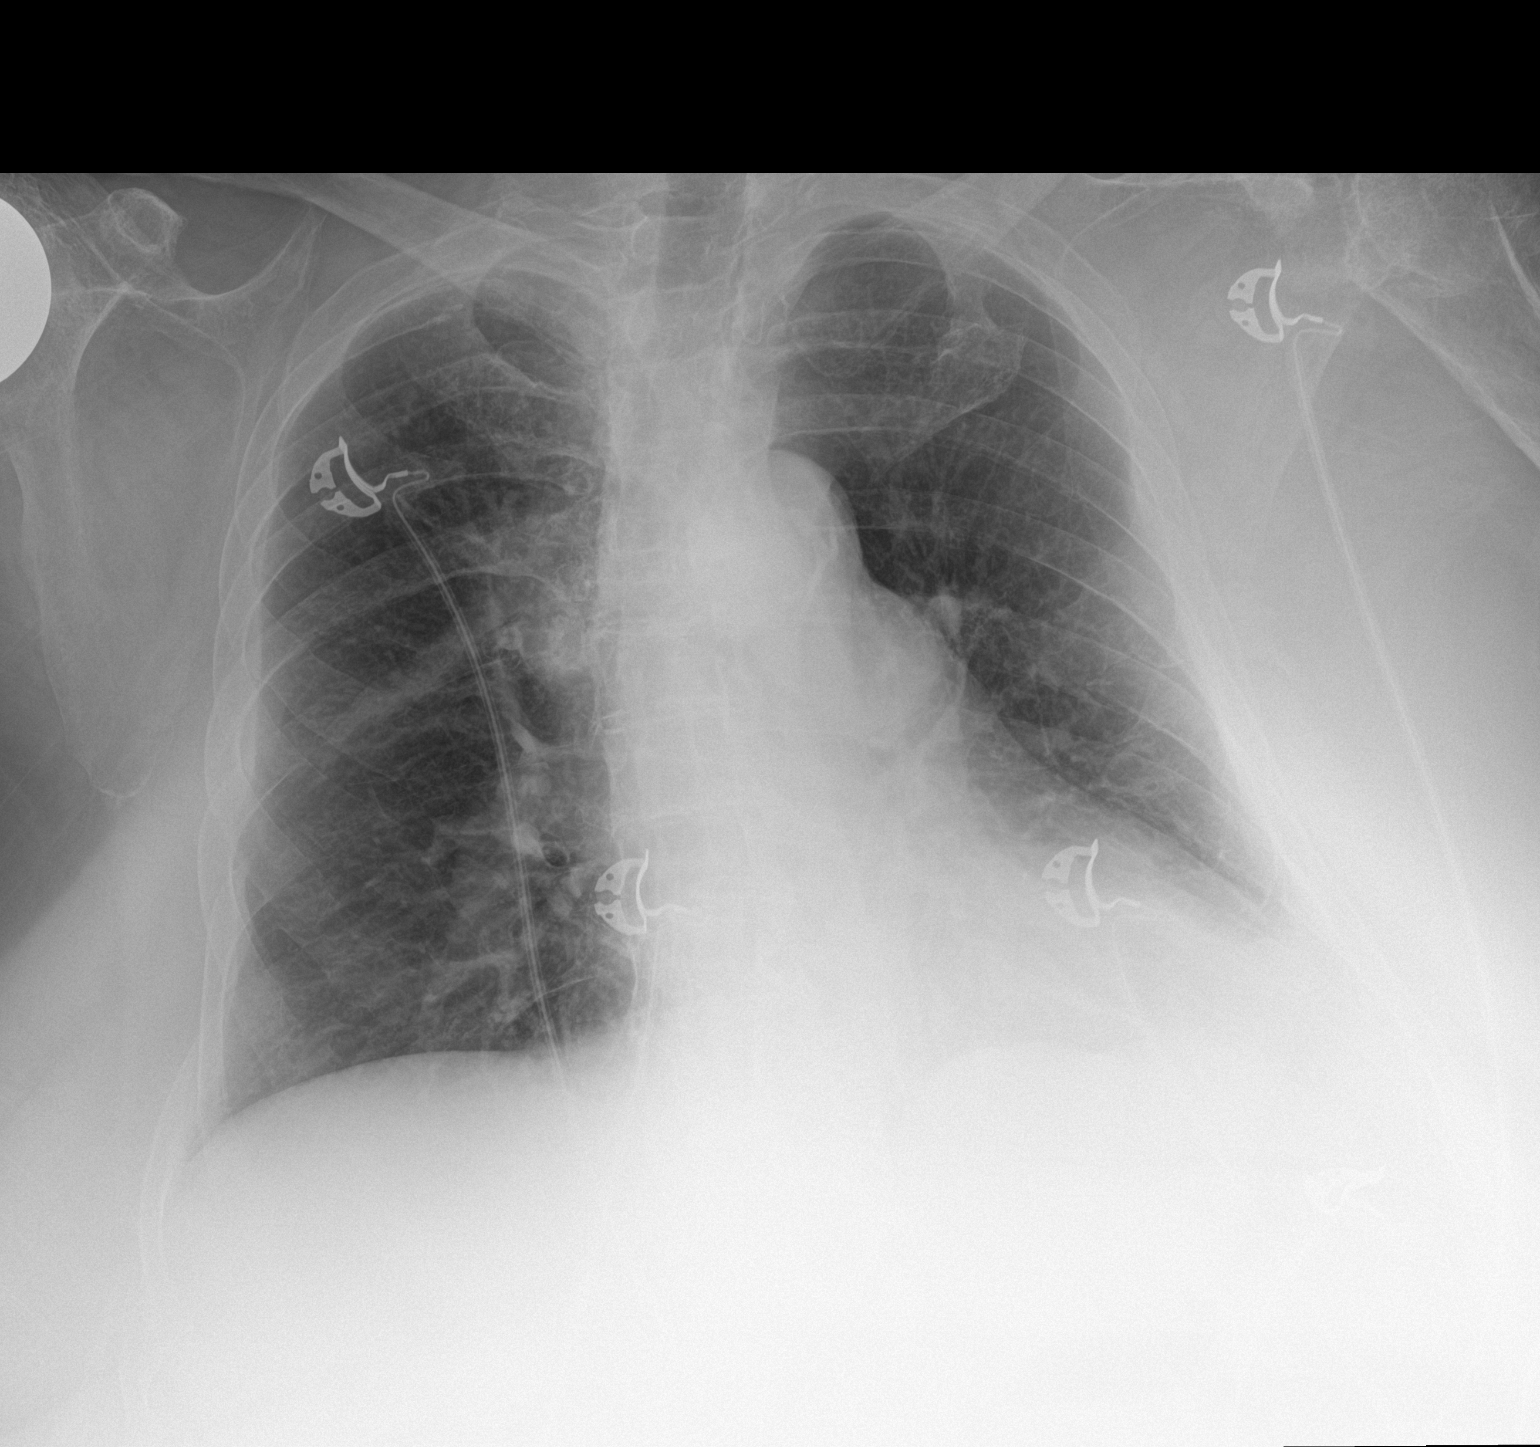

[1 of 1 positions shown; findings below may reference images not displayed]

FINDINGS: Chronic cardiomegaly and main pulmonary artery enlargement. Large
lung volumes. Patient has history of COPD. There is a linear density
over the right mid lung that is stable. This correlates with a
scar-like appearance on a November 2017 chest CT. There is no edema,
consolidation, effusion, or pneumothorax. Remote left humeral neck
fracture.
IMPRESSION: 1. No acute finding when compared to prior.
2. Cardiomegaly and pulmonary artery enlargement/hypertension.
3. COPD.

## 2019-08-15 IMAGING — DX DG CHEST 1V
1 series · 1 of 1 positions shown · non-contrast
Comparison: 01/04/2018

CLINICAL DATA: Followup pneumonia

EXAM:
CHEST 1 VIEW

[chest ap]
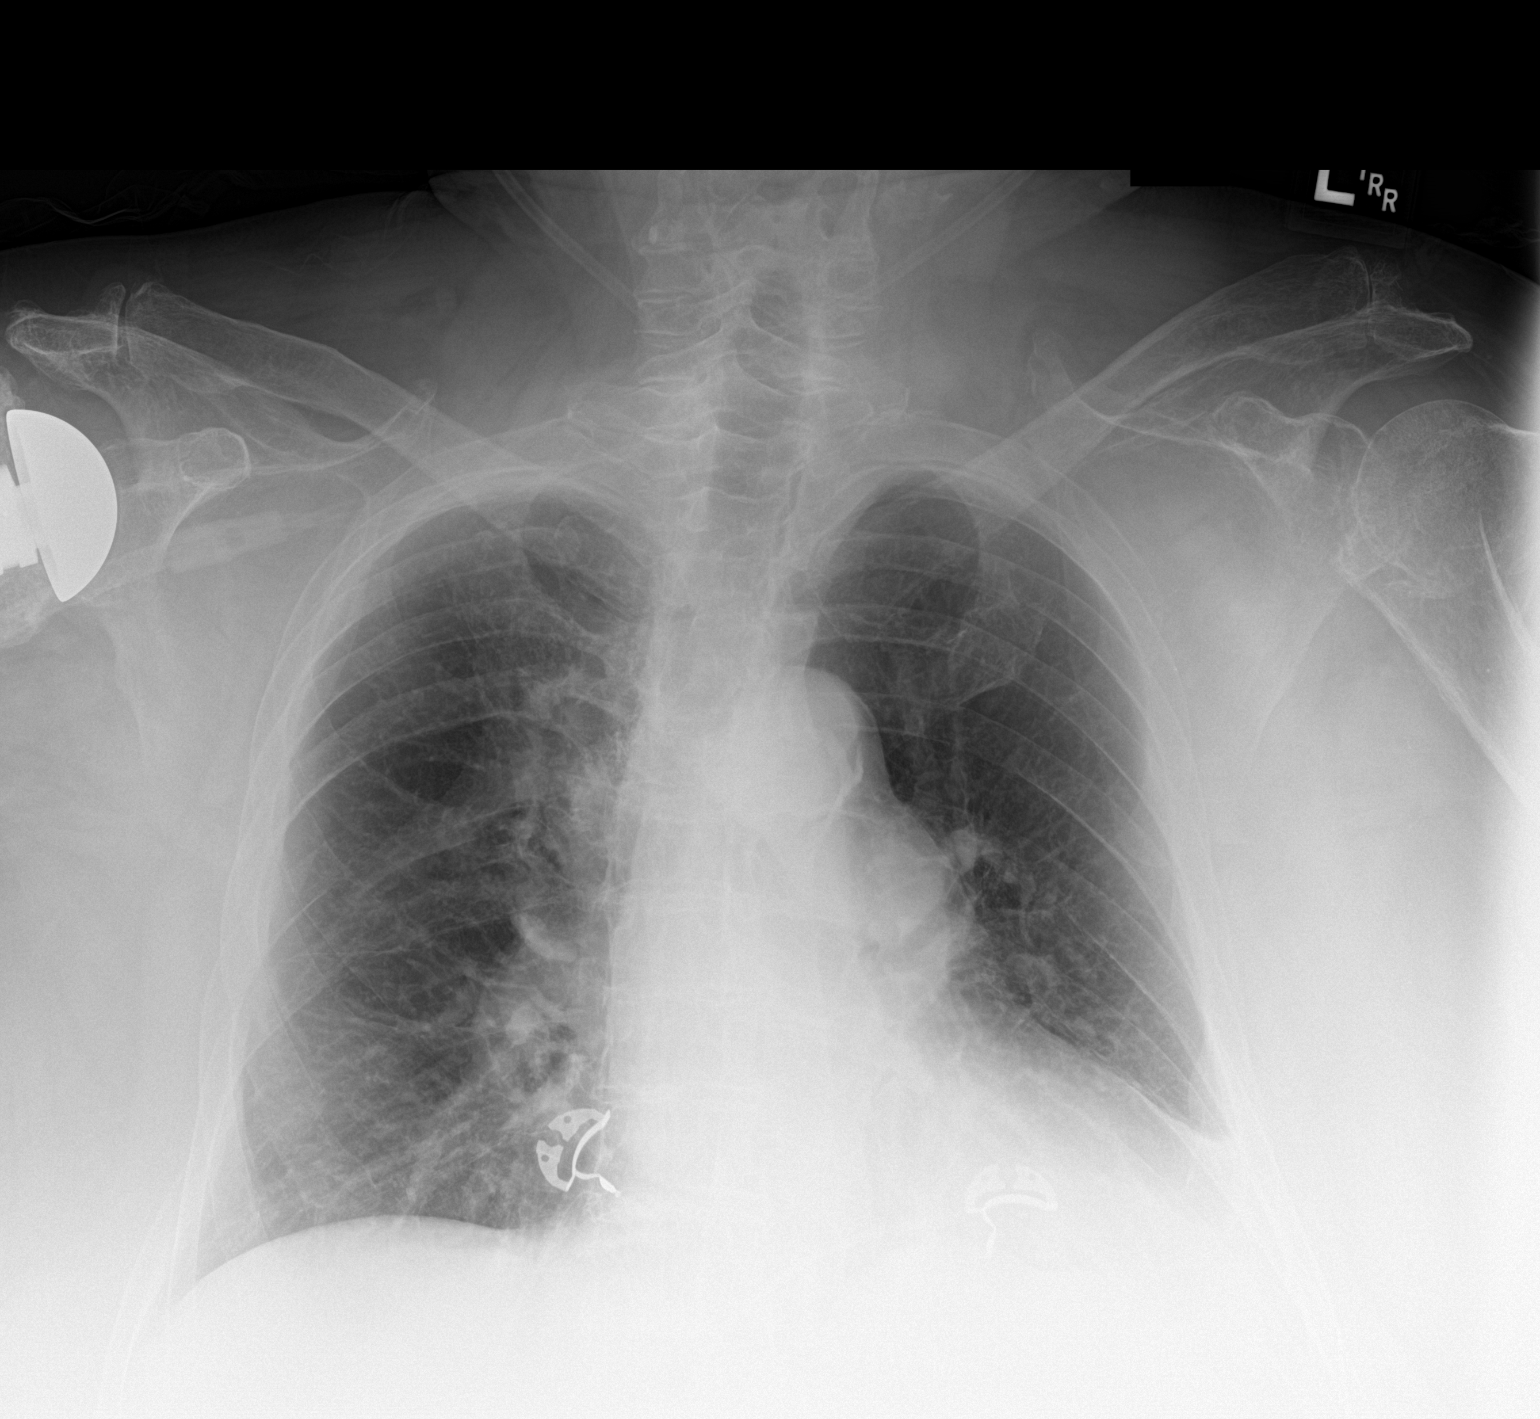

[1 of 1 positions shown; findings below may reference images not displayed]

FINDINGS: Heart size is normal. Aortic atherosclerosis. Right lung is clear.
Patchy infiltrate and volume loss in the left lower lobe is
unchanged. No worsening or new finding.
IMPRESSION: Patchy infiltrate and volume loss in the left lower lobe.

## 2019-08-15 IMAGING — CR DG SHOULDER 2+V*L*
1 series · 2 of 2 positions shown · non-contrast
Comparison: Left shoulder series of December 14, 2017

CLINICAL DATA: Left shoulder pain since falling out of bed.

EXAM:
LEFT SHOULDER - 2+ VIEW

[Series 1: x shoulder ap left · 0.14mm/px · 2 of 2 slices shown]
[im 1/2]
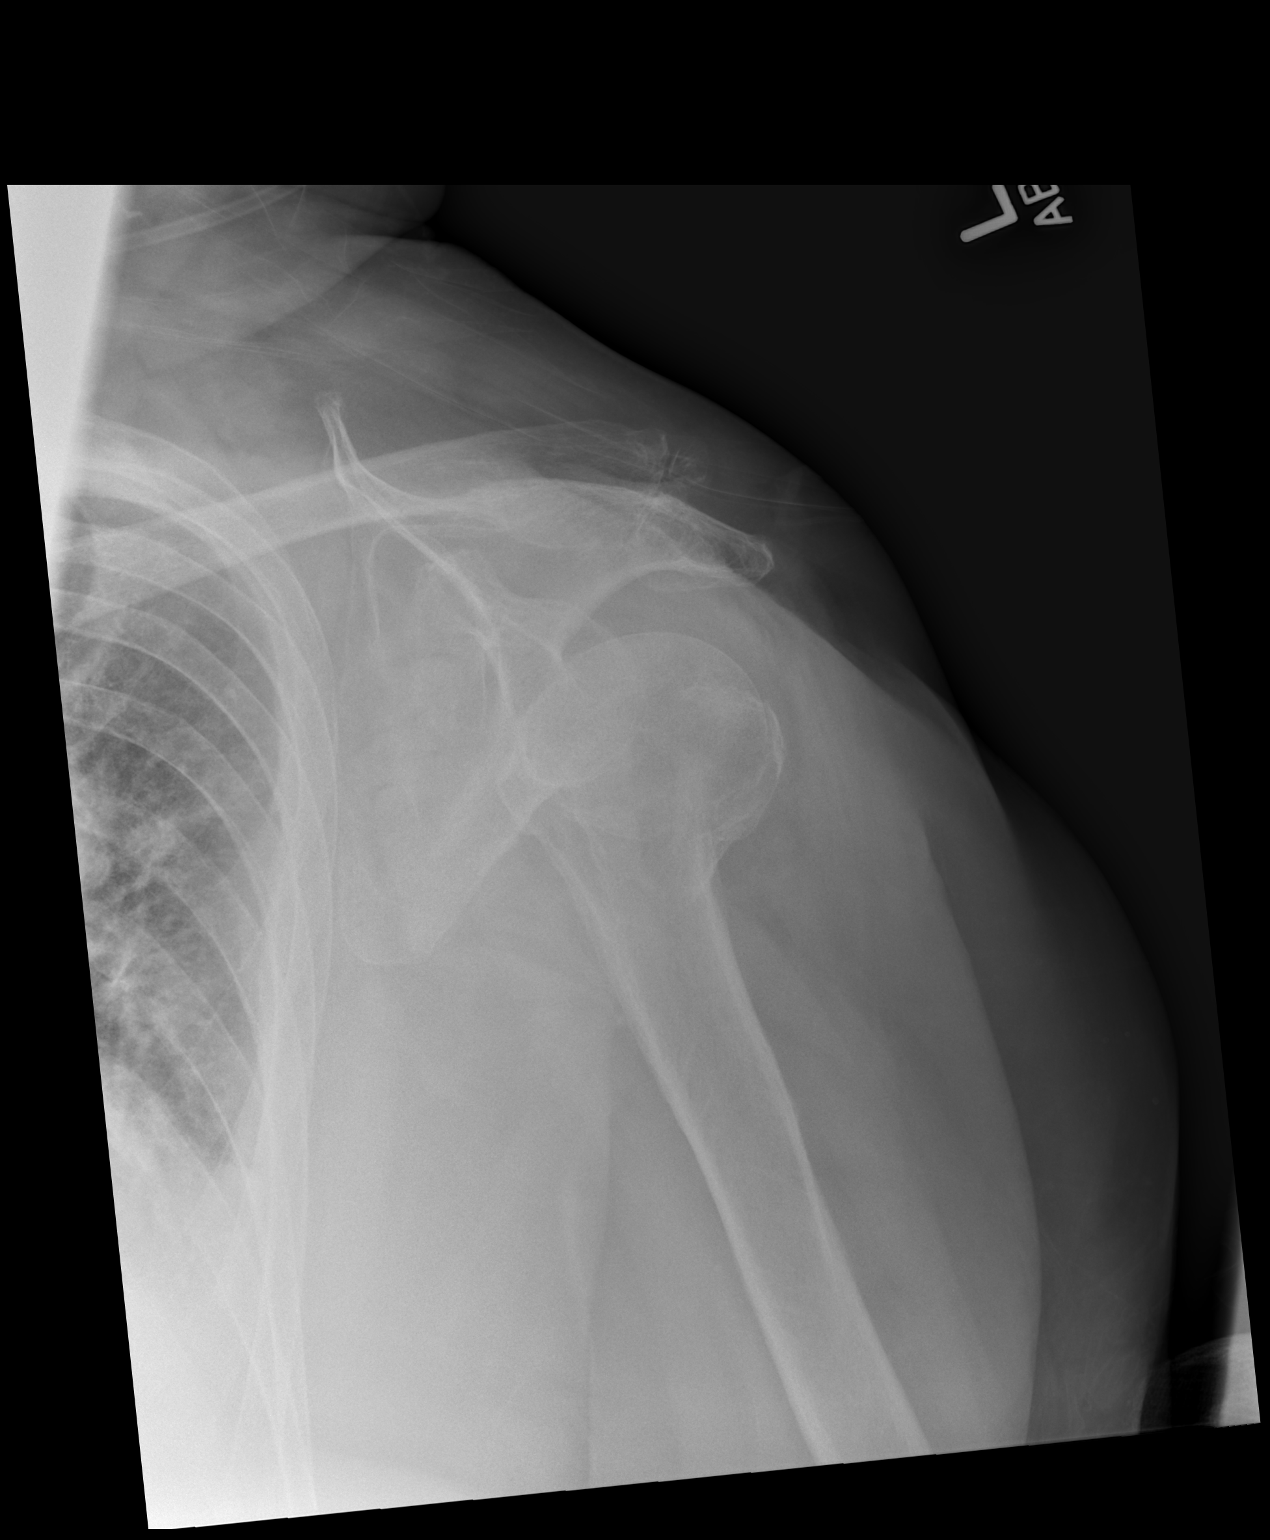
[im 2/2]
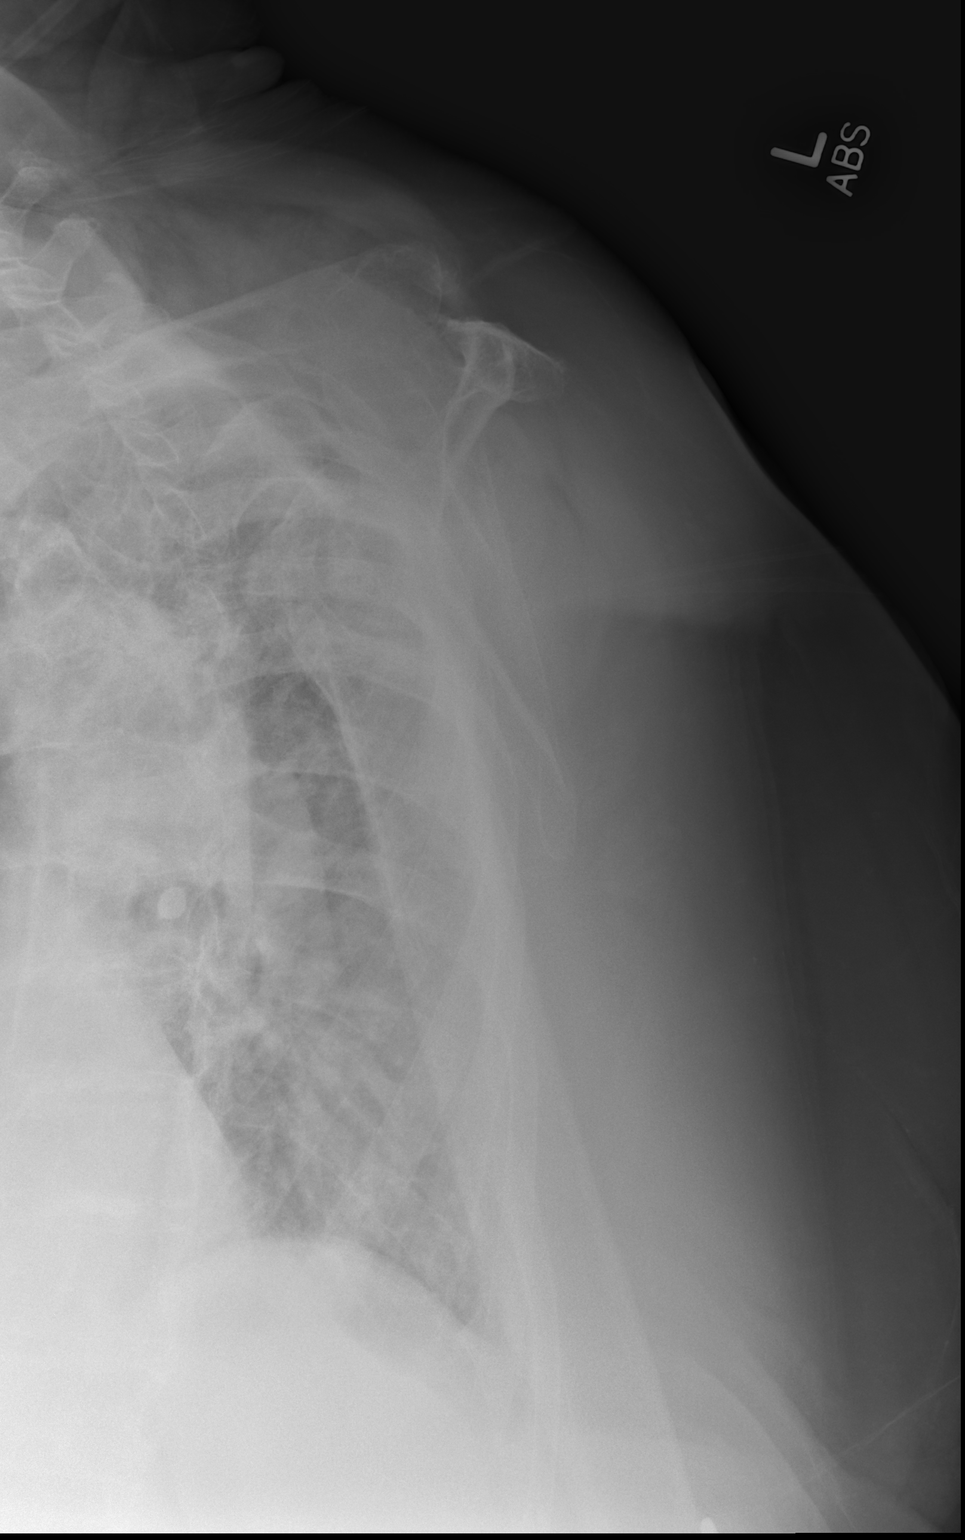

[2 of 2 positions shown; findings below may reference images not displayed]

FINDINGS: The images are limited due to scatter effects from overlying soft
tissues. There is chronic deformity of the left humeral neck. No
acute fracture or dislocation is observed. There are degenerative
changes of the AC joint.
IMPRESSION: Old left surgical neck fracture which has healed with deformity. No
acute bony abnormality.

## 2019-09-10 IMAGING — DX DG KNEE COMPLETE 4+V*L*
4 series · 4 of 4 positions shown · non-contrast
Comparison: June 05, 2015

CLINICAL DATA: Pain for several weeks

EXAM:
LEFT KNEE - COMPLETE 4+ VIEW

[knee tunnel]
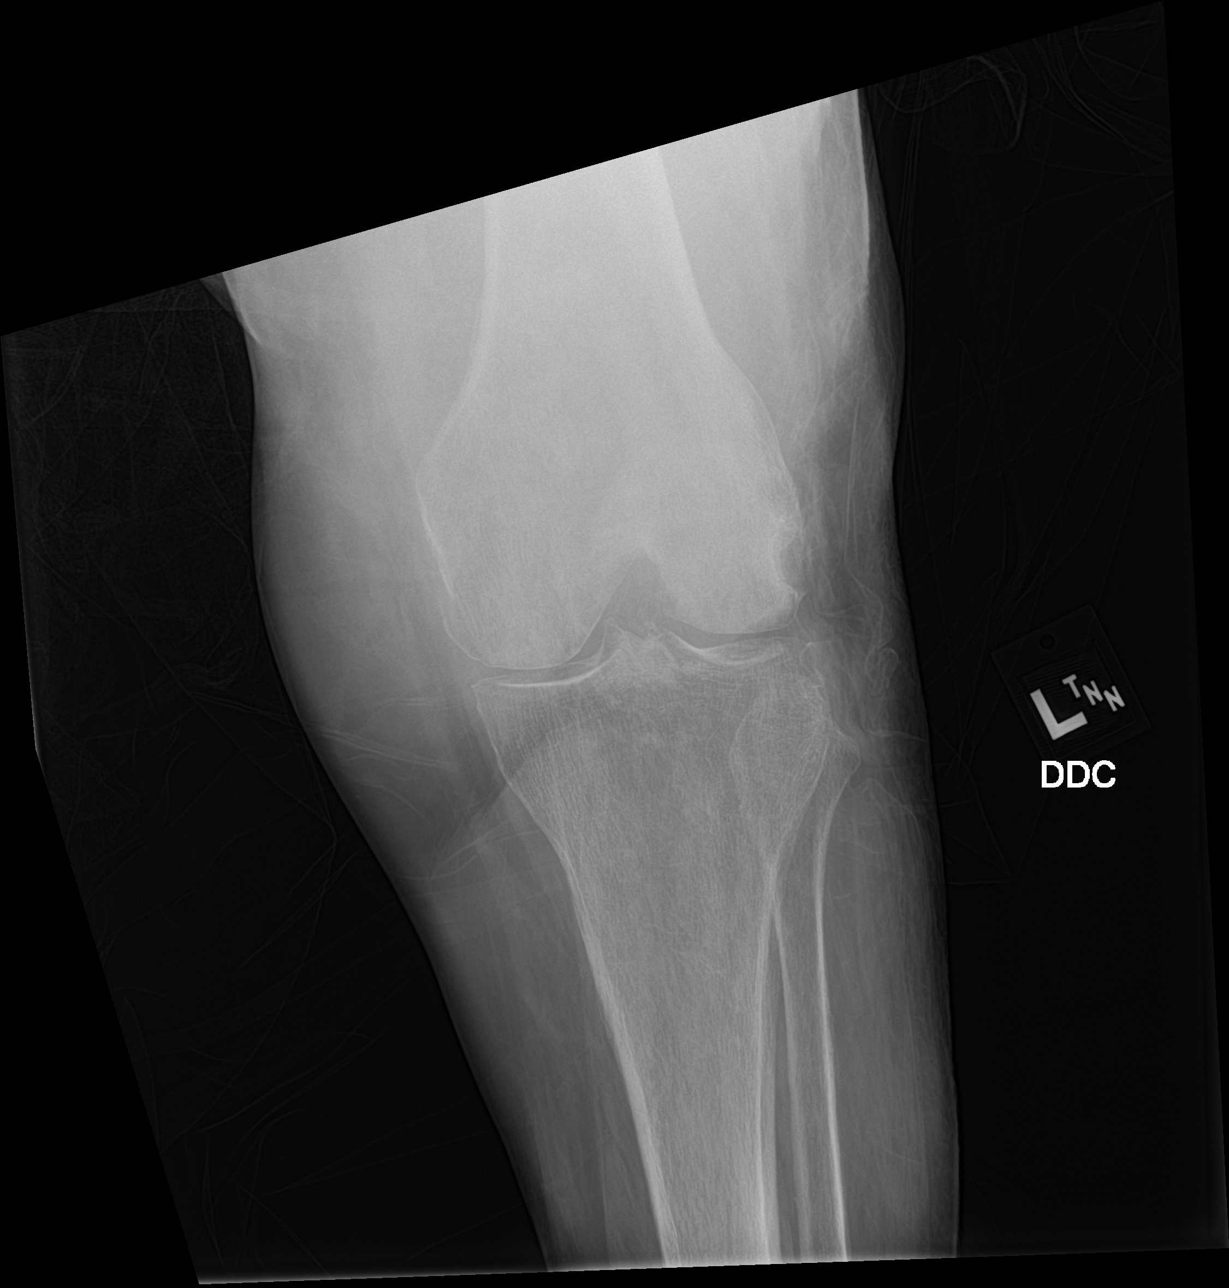

[knee lat]
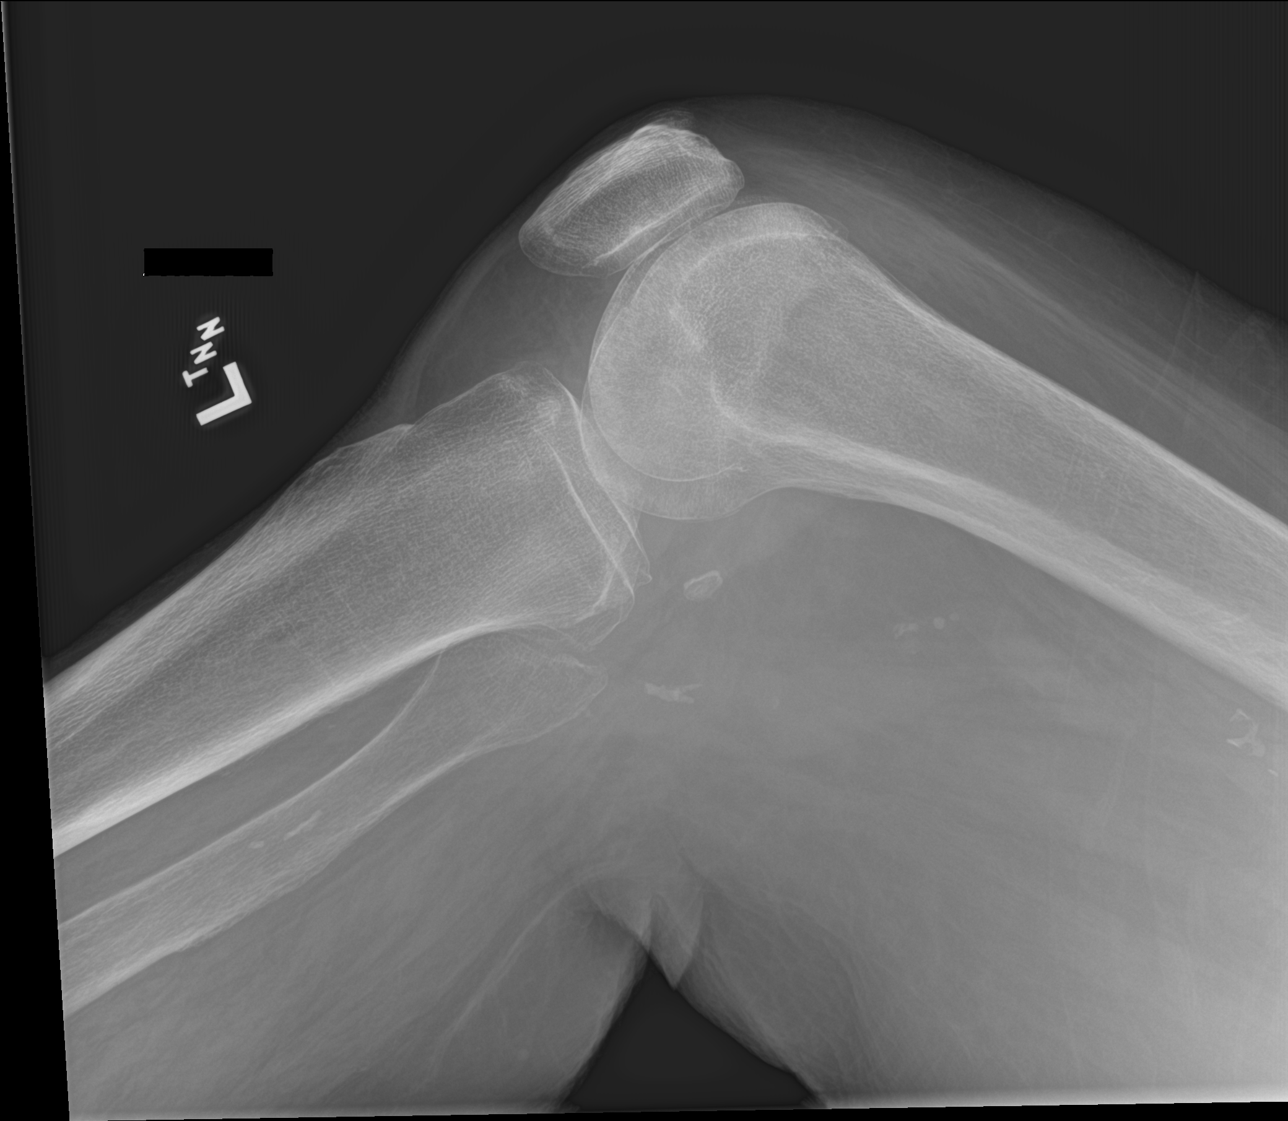

[knee obl (1 of 2)]
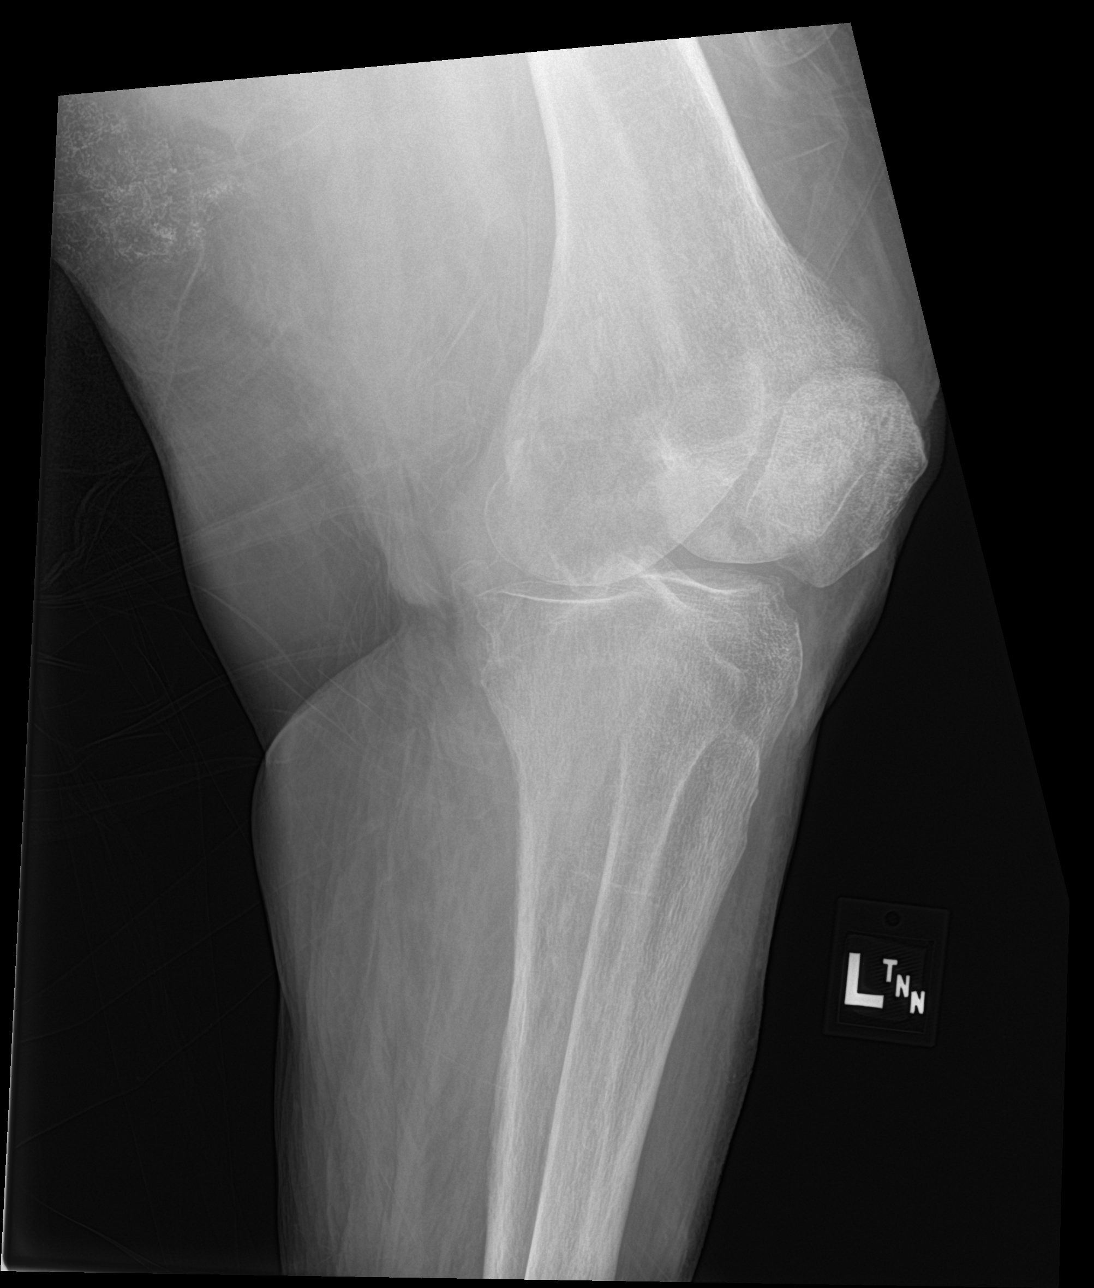

[knee obl (2 of 2)]
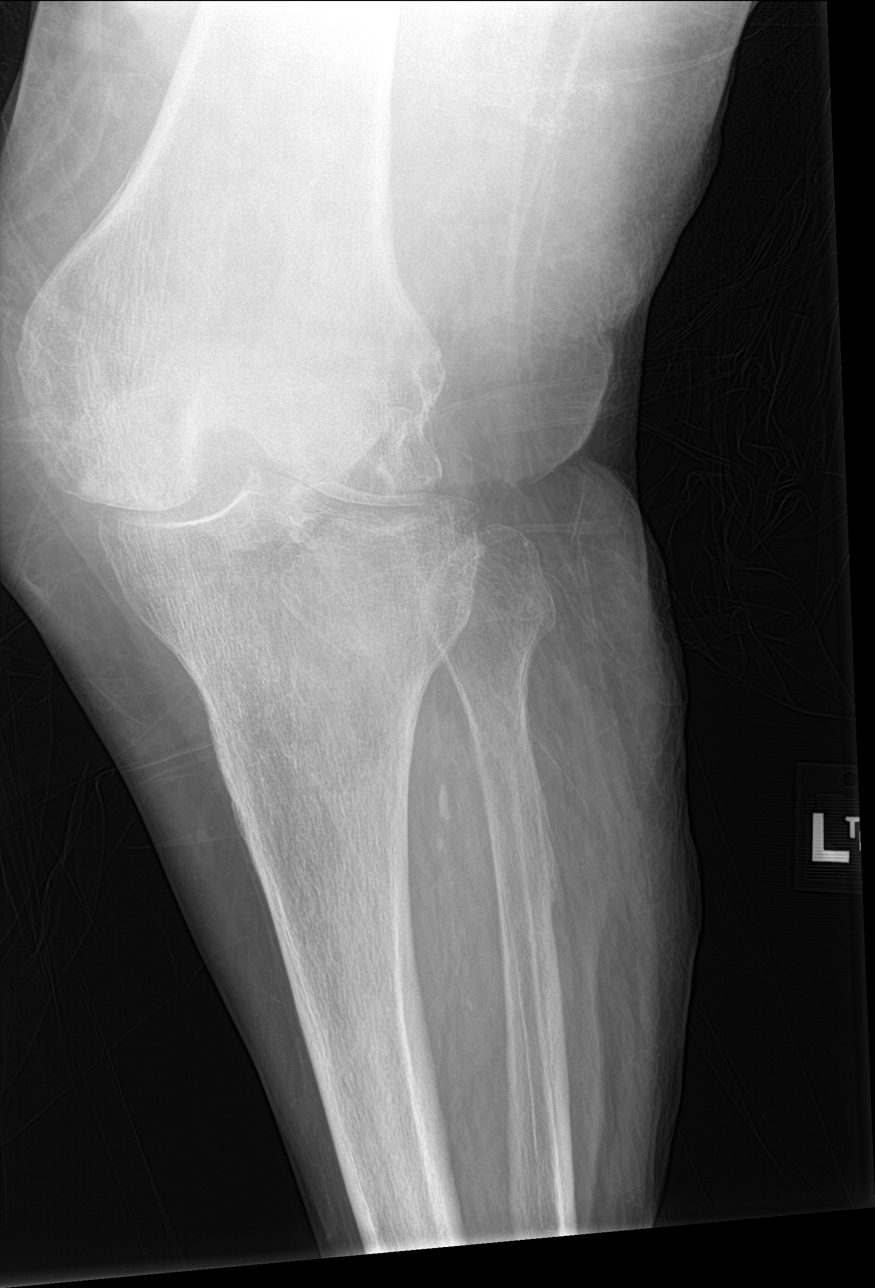

[4 of 4 positions shown; findings below may reference images not displayed]

FINDINGS: Frontal, lateral, and bilateral oblique views were obtained. No
fracture or dislocation. No joint effusion. There is narrowing
medially and in the patellofemoral joint regions. There is mild
spurring medially and laterally. No erosive change.
IMPRESSION: Areas of osteoarthritic change. No fracture or dislocation. No joint
effusion.

## 2019-09-10 IMAGING — DX DG SHOULDER 2+V*L*
4 series · 4 of 4 positions shown · non-contrast
Comparison: December 05, 2018

CLINICAL DATA: Pain following fall

EXAM:
LEFT SHOULDER - 2+ VIEW

[shoulder ap (1 of 2)]
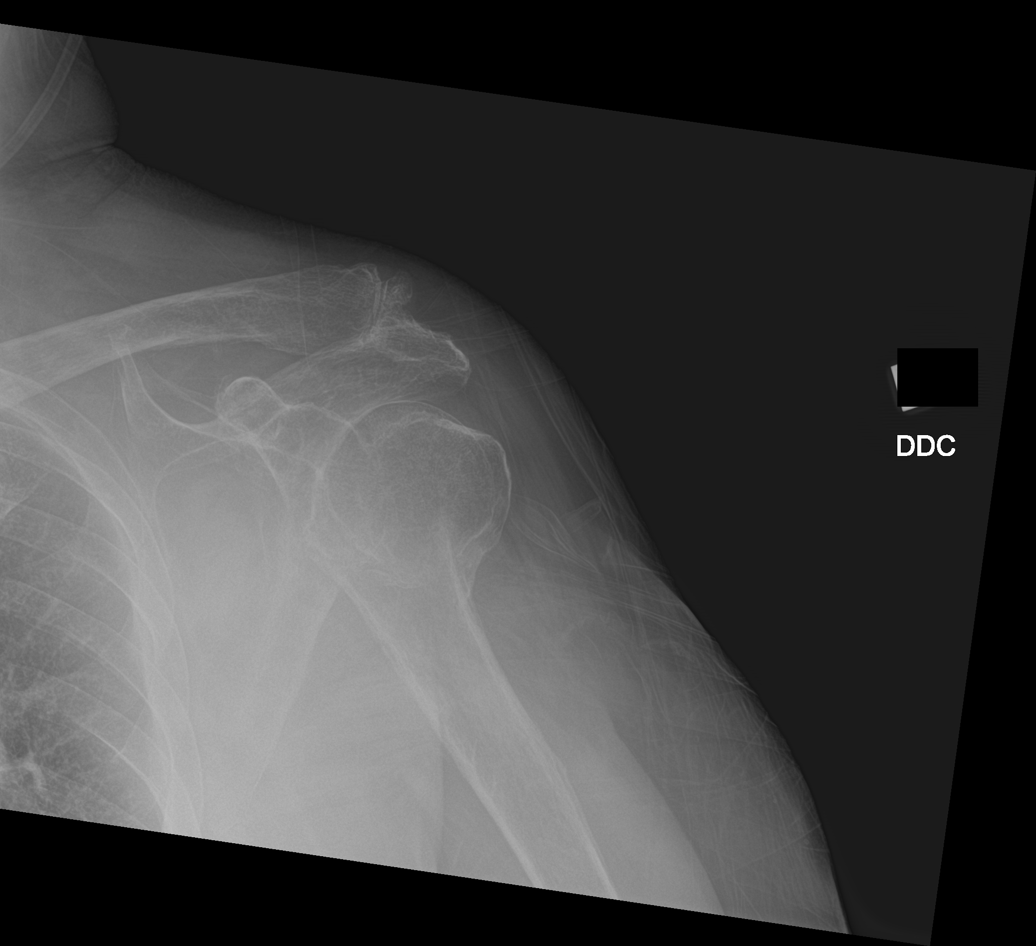

[shoulder obl (1 of 2)]
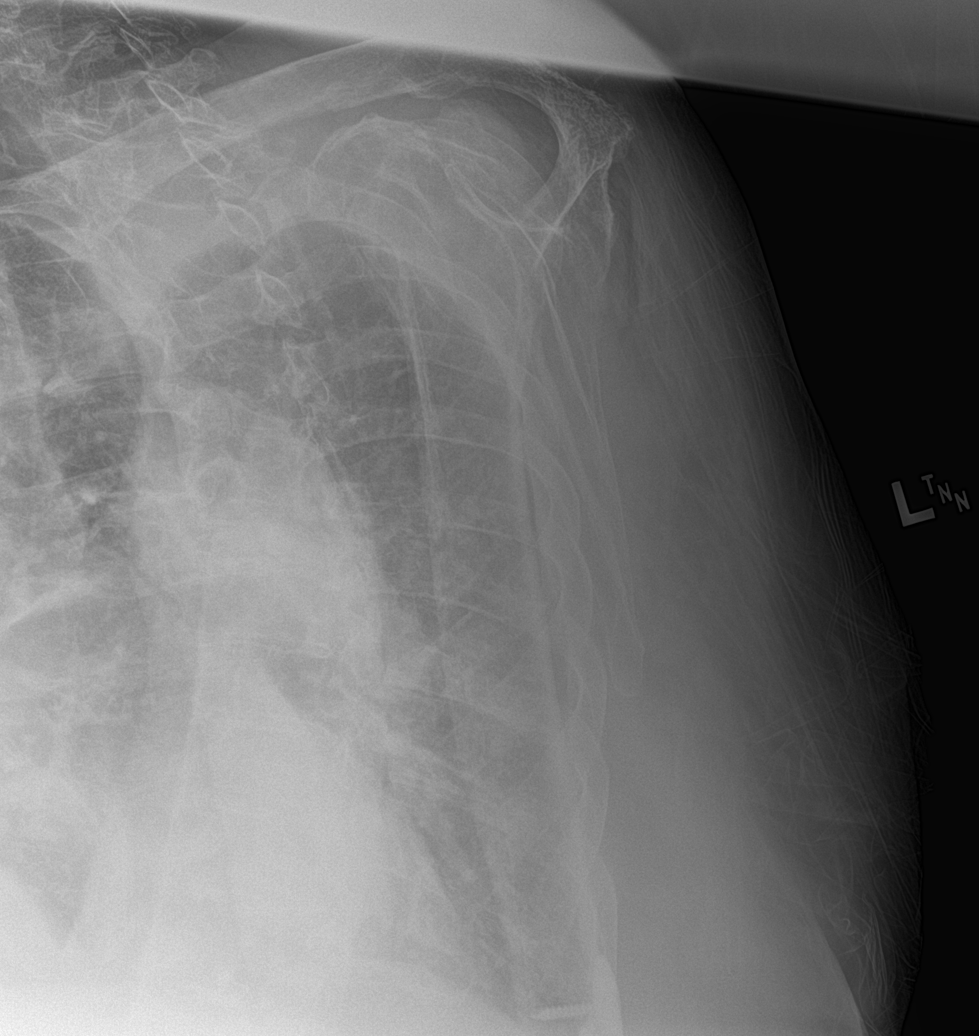

[shoulder ap (2 of 2)]
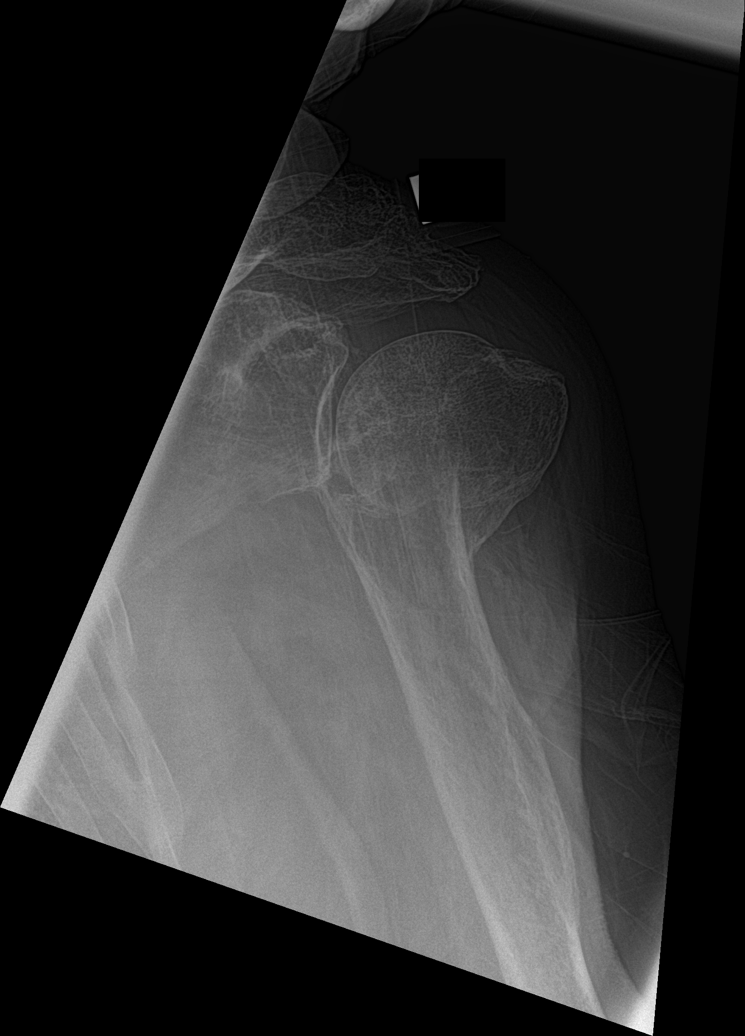

[shoulder obl (2 of 2)]
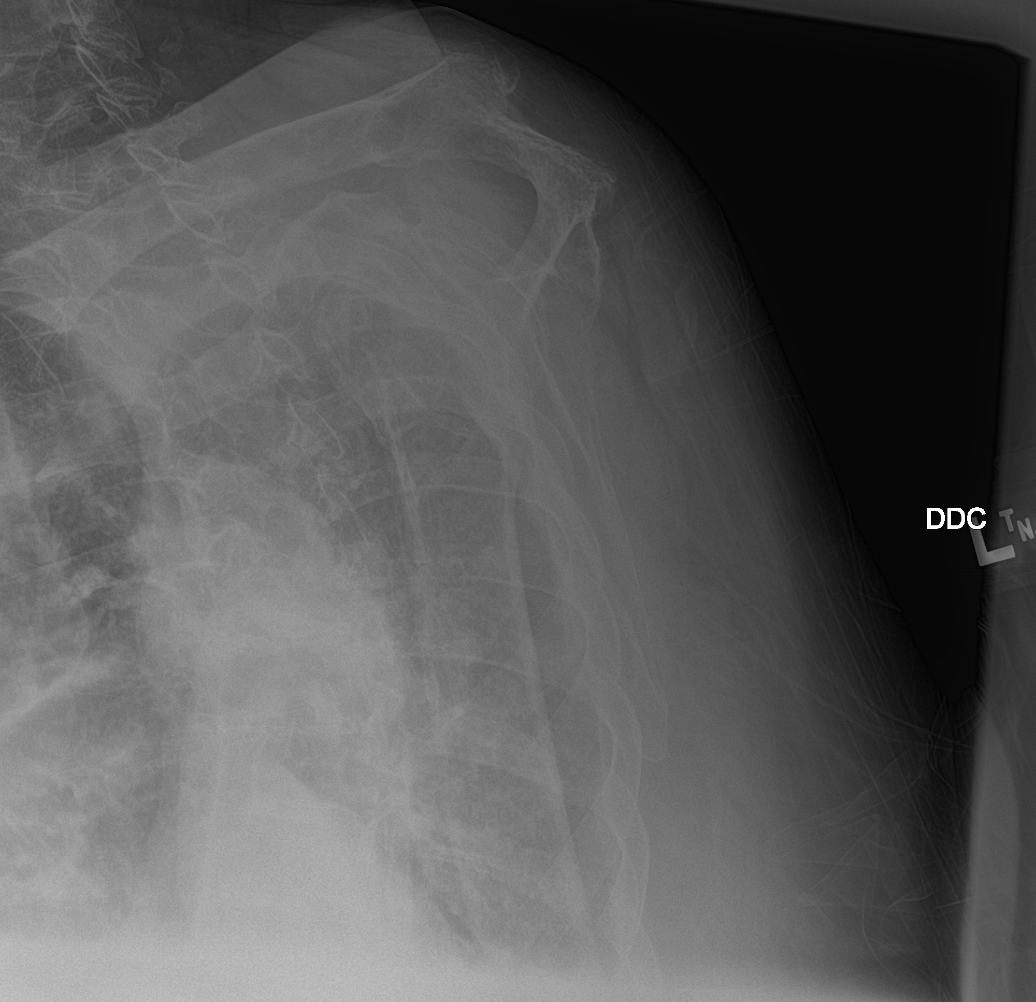

[4 of 4 positions shown; findings below may reference images not displayed]

FINDINGS: Frontal, oblique, and Y scapular images were obtained. There is
evidence of a prior fracture of the proximal humeral metaphysis with
remodeling, stable. No acute fracture or dislocation. There is
moderately severe generalized osteoarthritic change. No erosive
change. Visualized left lung clear.
IMPRESSION: Old fracture proximal humeral metaphysis with remodeling. Extensive
arthropathy. No acute fracture or dislocation.

## 2019-10-09 IMAGING — CT CT PELVIS W/O CM
2 of 3 series · 15 of 46 positions shown, 17 images · non-contrast
Comparison: CT abdomen and pelvis 09/04/2017.

CLINICAL DATA: The patient fell out of [place] [employer] lift that was
helping her into bed this evening. Initial encounter.

EXAM:
CT PELVIS WITHOUT CONTRAST
TECHNIQUE: Multidetector CT imaging of the pelvis was performed following the
standard protocol without intravenous contrast.

[Series 3: axial st · axial · 0.98mm/px · z∈[-1174,-926]mm · 12 of 144 slices shown, 14 images]
[im 10/144  soft-tissue]
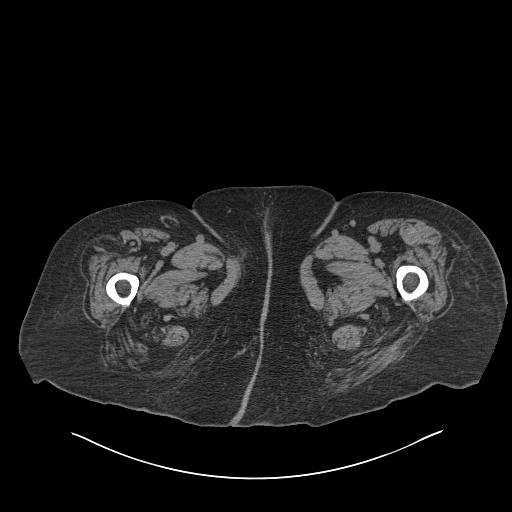
[im 10/144  bone]
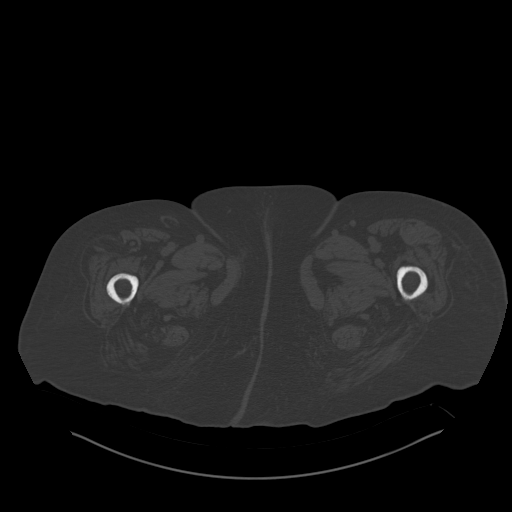
[im 19/144  soft-tissue]
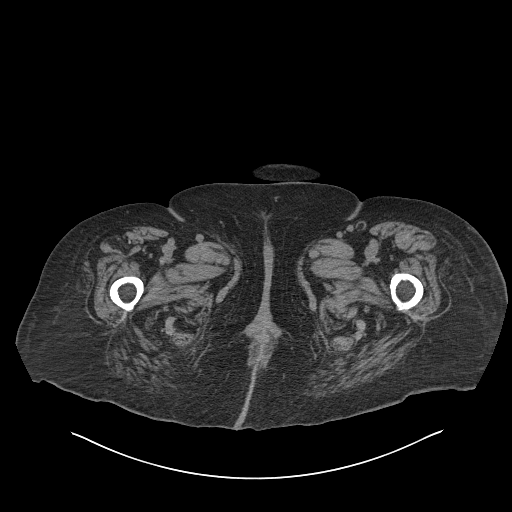
[im 33/144  soft-tissue]
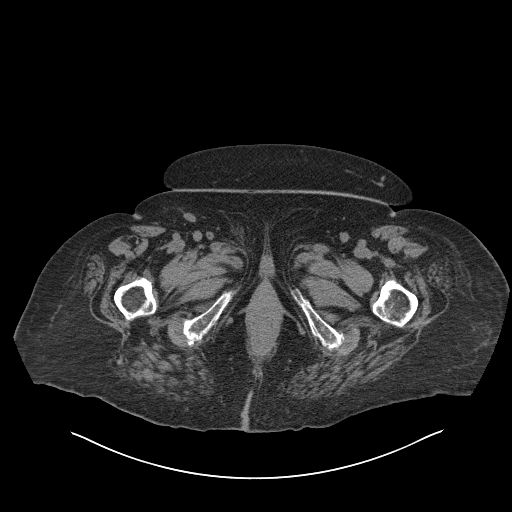
[im 42/144  soft-tissue]
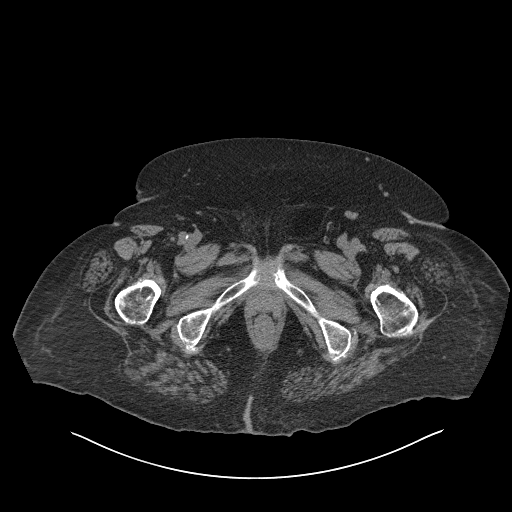
[im 56/144  soft-tissue]
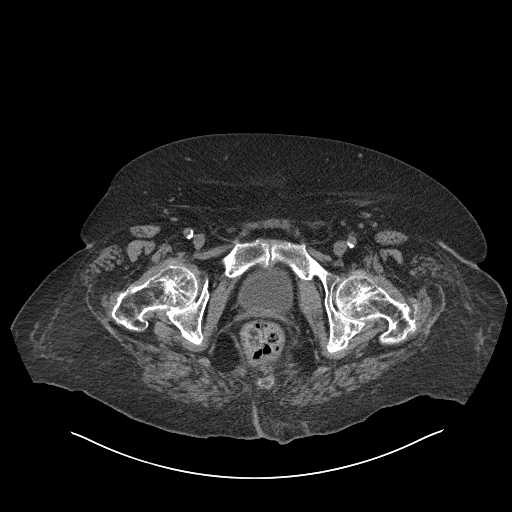
[im 65/144  soft-tissue]
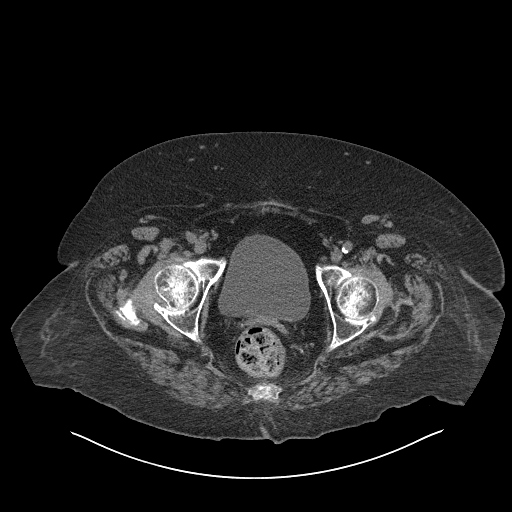
[im 79/144  soft-tissue]
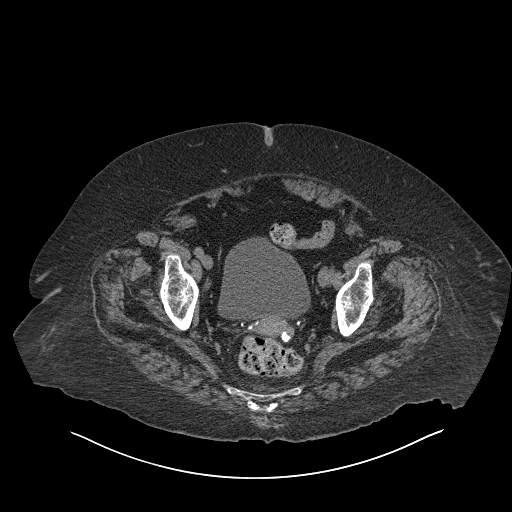
[im 88/144  soft-tissue]
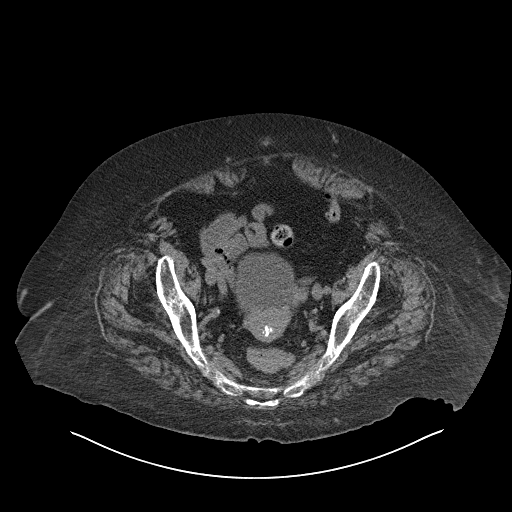
[im 102/144  soft-tissue]
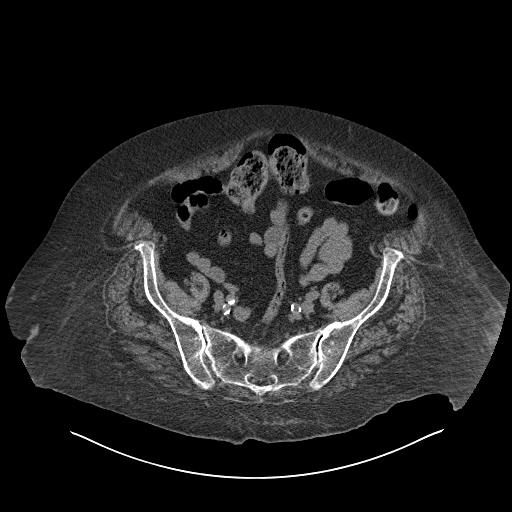
[im 102/144  bone]
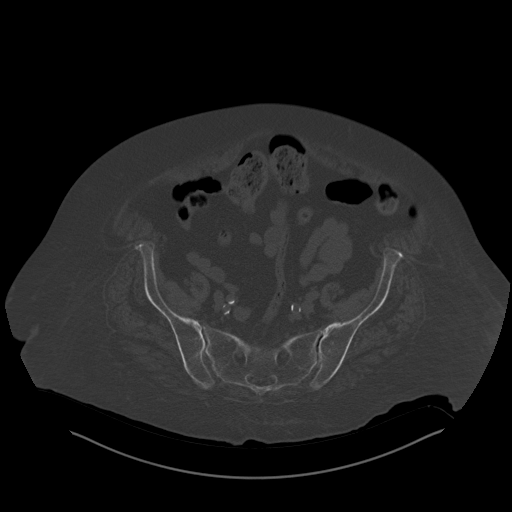
[im 111/144  soft-tissue]
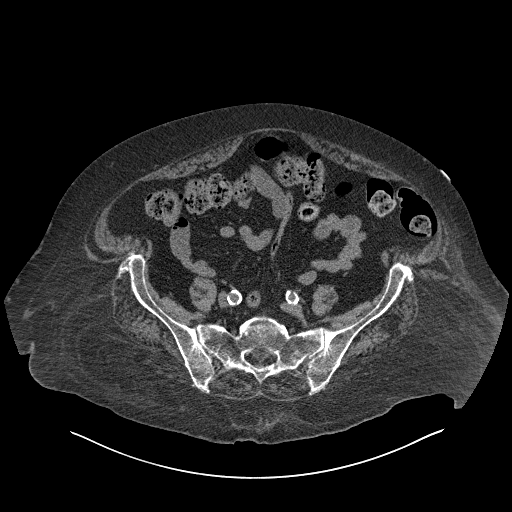
[im 125/144  soft-tissue]
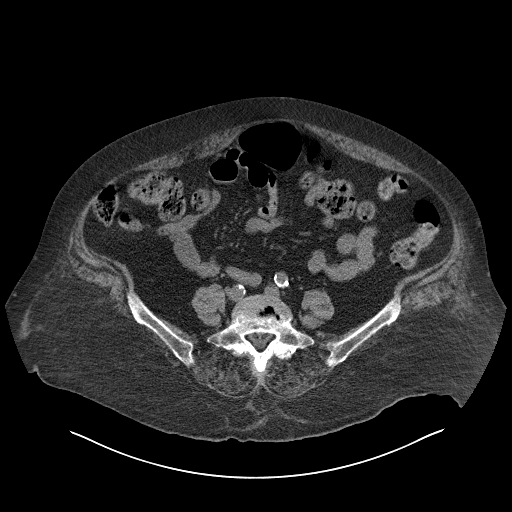
[im 134/144  soft-tissue]
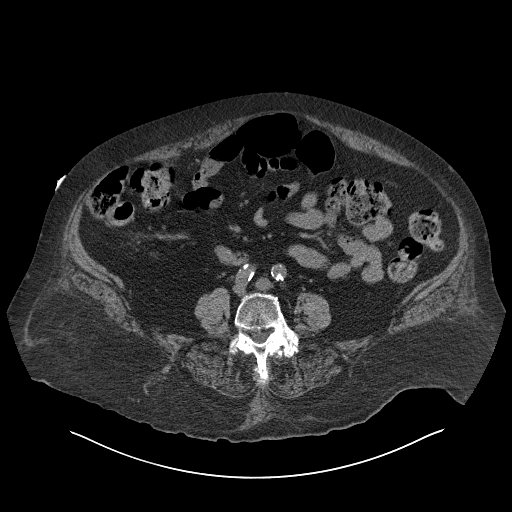

[Series 6: coronal st · coronal · 0.56mm/px · 3 of 168 slices shown]
[im 56/168  soft-tissue]
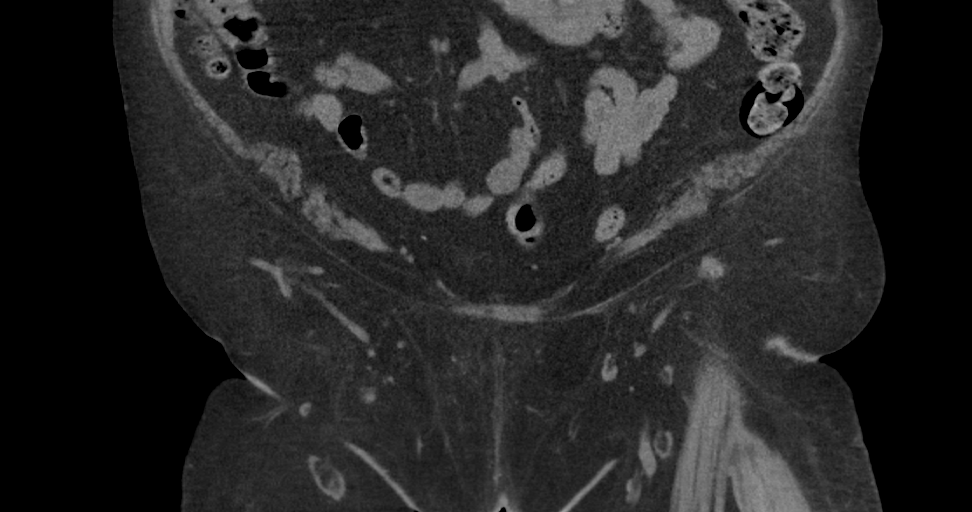
[im 75/168  soft-tissue]
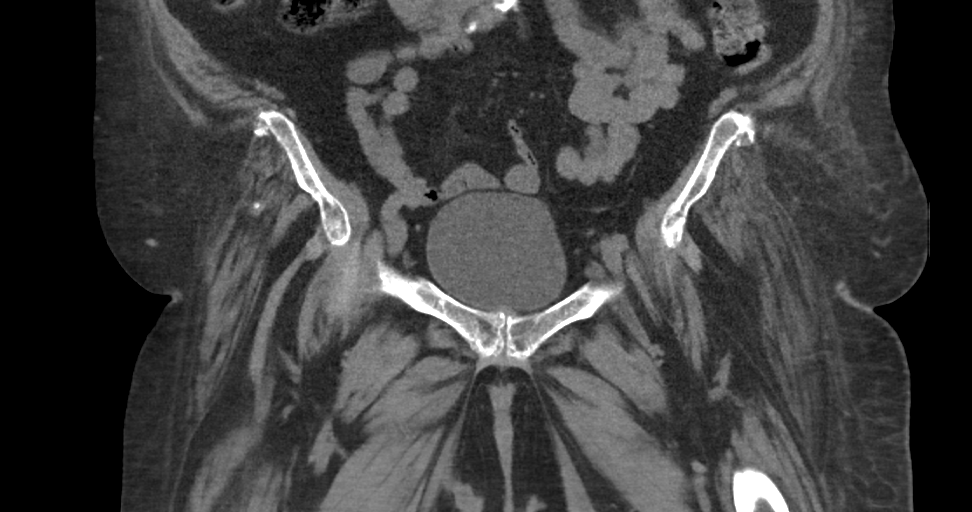
[im 93/168  soft-tissue]
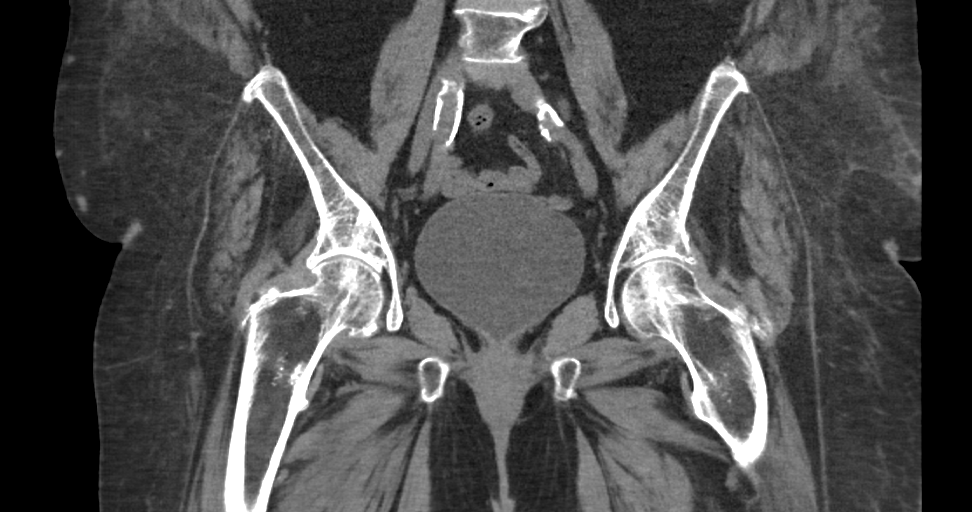

[15 of 46 positions shown; findings below may reference images not displayed]

FINDINGS: No fracture is identified. The hips are located. Degenerative
disease is seen in the lower lumbar spine. Mild to moderate
bilateral hip osteoarthritis is also noted. No lytic or sclerotic
lesion. No avascular necrosis of the femoral heads.

Imaged intrapelvic contents demonstrate no acute abnormality. Small
calcified uterine fibroids are noted. Atherosclerotic vascular
disease is seen. Imaged bowel loops are unremarkable.
IMPRESSION: No acute abnormality.

Lower lumbar spondylosis. Mild-to-moderate degenerative disease
about the hips also noted.

Atherosclerosis.

## 2019-10-09 IMAGING — CT CT HEAD W/O CM
3 series · 16 of 47 positions shown, 19 images · non-contrast
Comparison: He 01/11/2017

CLINICAL DATA: Fell getting into bed with [employer] lift, fell out of
bed and landed on side, recovering from vertebral fracture in
[REDACTED], history cardiomyopathy, CHF, chronic kidney disease,
COPD, diabetes mellitus, hypertension, former smoker

EXAM:
CT HEAD WITHOUT CONTRAST
TECHNIQUE: Contiguous axial images were obtained from the base of the skull
through the vertex without intravenous contrast. Sagittal and
coronal MPR images reconstructed from axial data set.

[Series 2: head wo · axial · 0.43mm/px · z∈[-219,-94]mm · 10 of 31 slices shown, 13 images]
[im 3/31  brain]
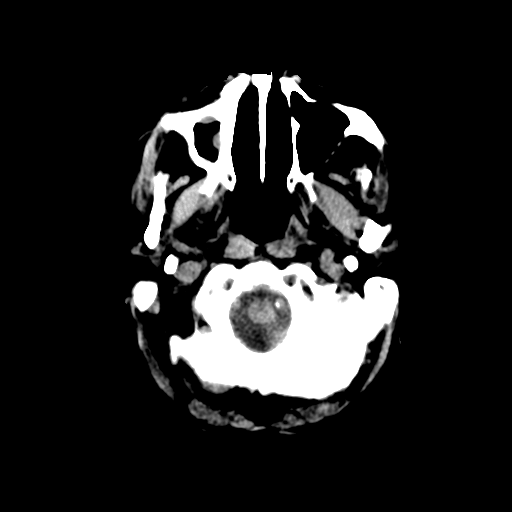
[im 3/31  bone]
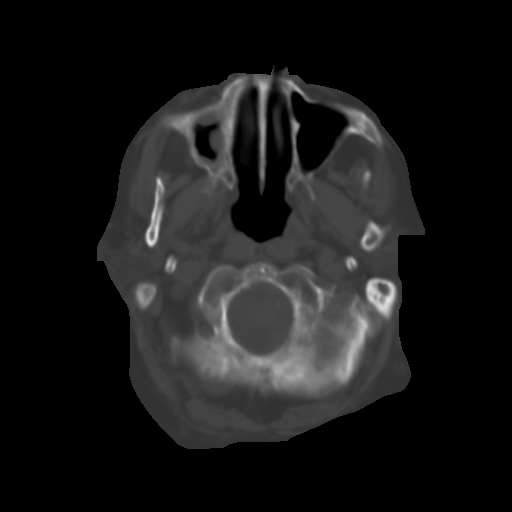
[im 6/31  brain]
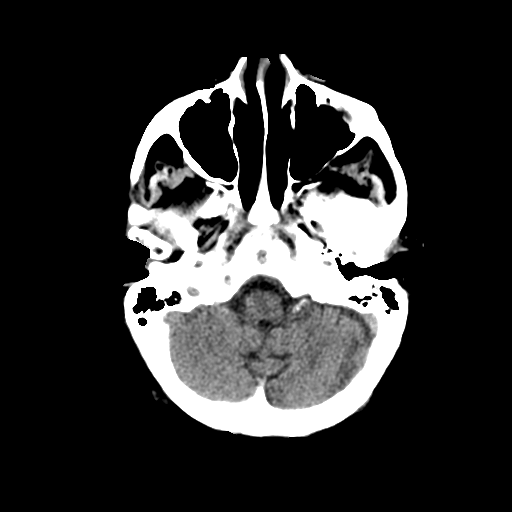
[im 9/31  brain]
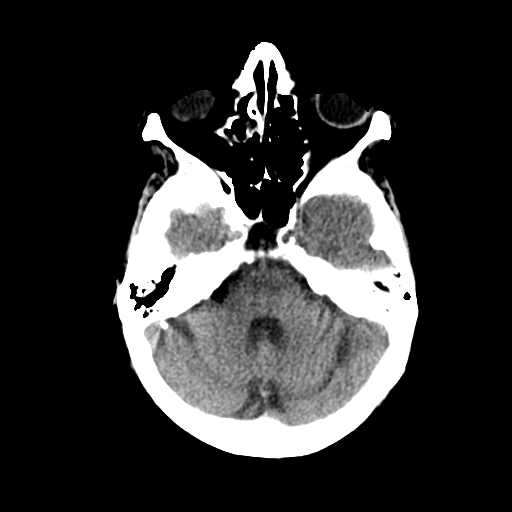
[im 11/31  brain]
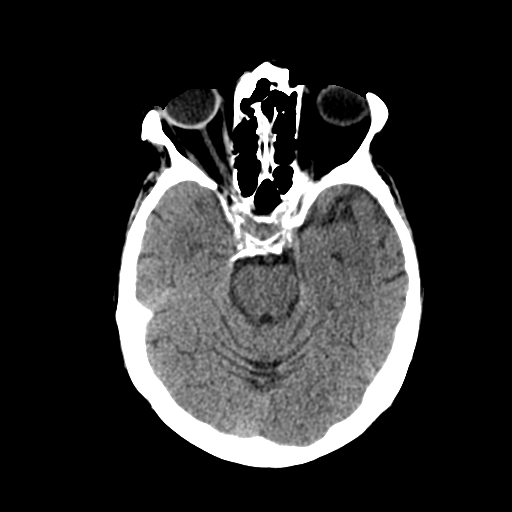
[im 14/31  brain]
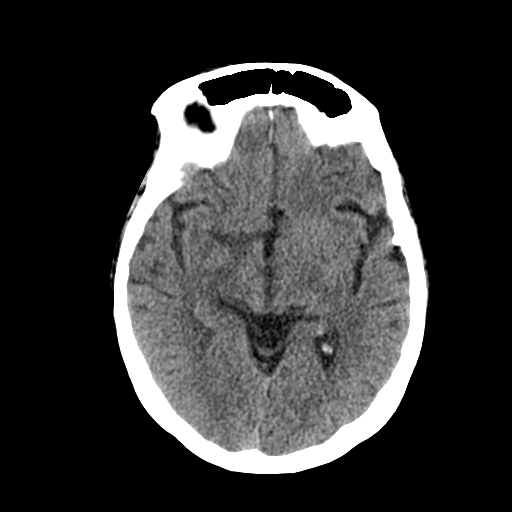
[im 14/31  bone]
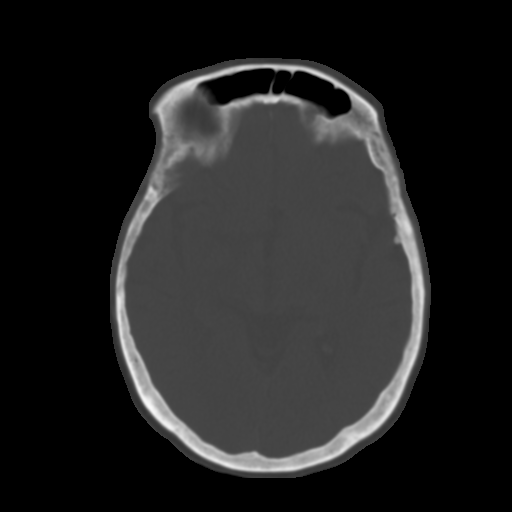
[im 17/31  brain]
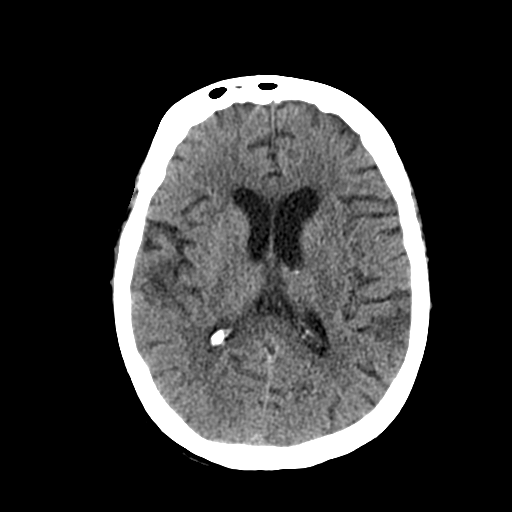
[im 20/31  brain]
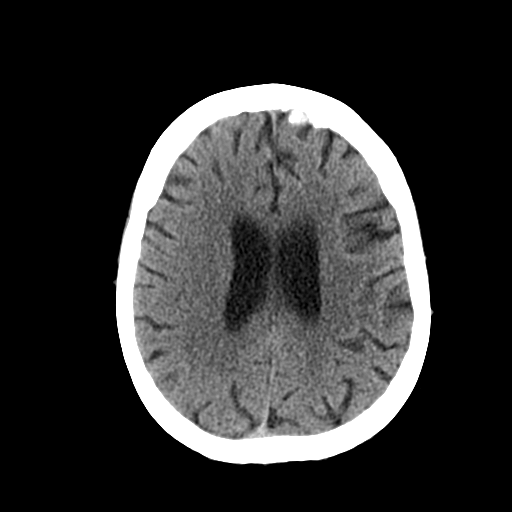
[im 23/31  brain]
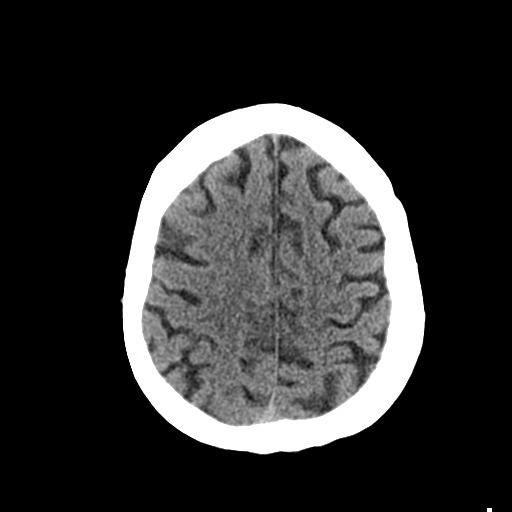
[im 25/31  brain]
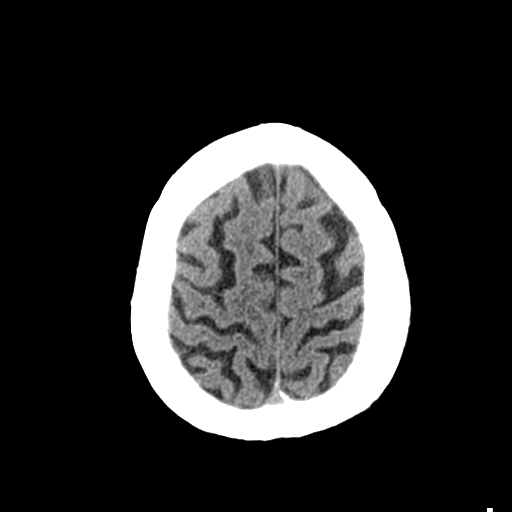
[im 25/31  bone]
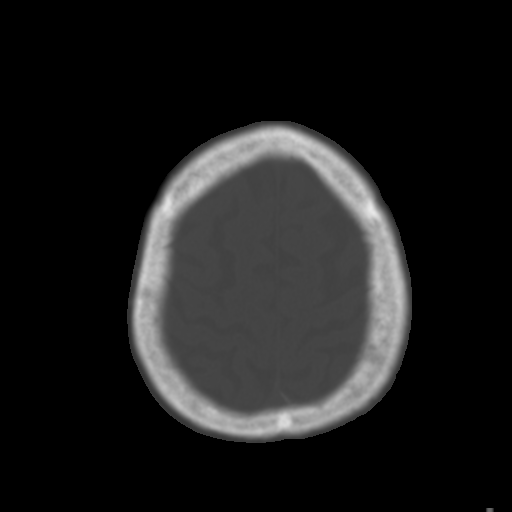
[im 28/31  brain]
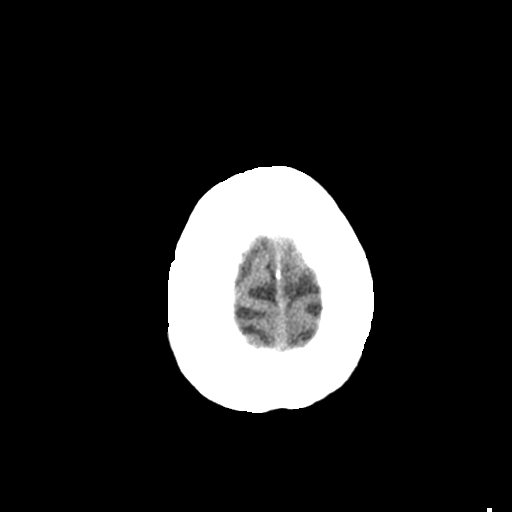

[Series 4: coronal soft tissue · coronal · 0.31mm/px · 3 of 64 slices shown]
[im 22/64  brain]
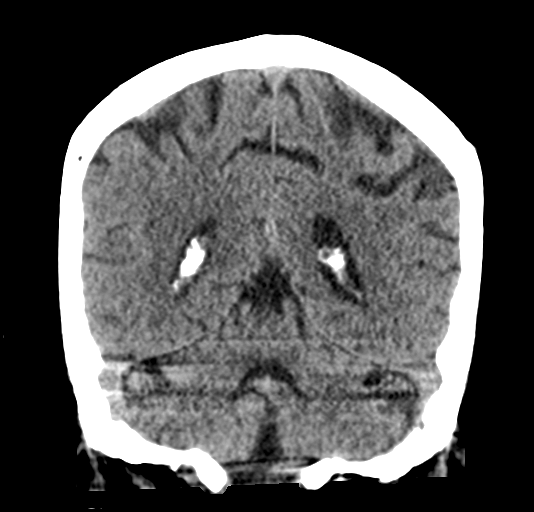
[im 29/64  brain]
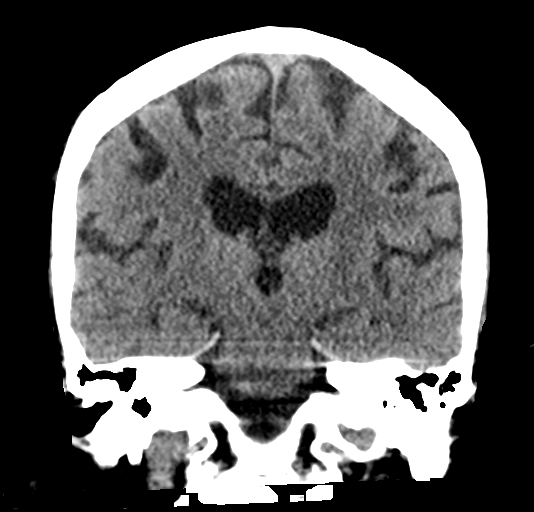
[im 36/64  brain]
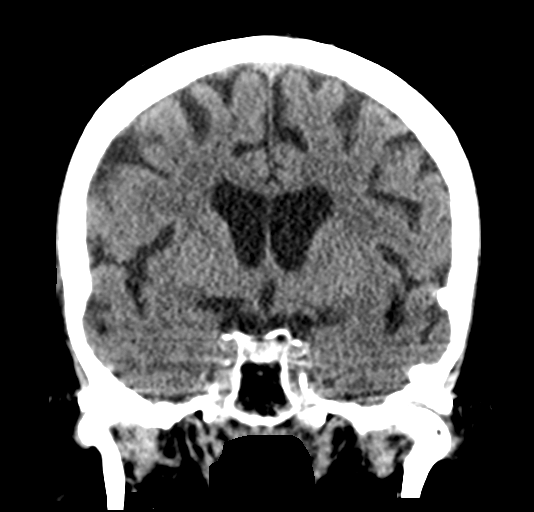

[Series 5: sagittal soft tissue · sagittal · 0.32mm/px · 3 of 48 slices shown]
[im 16/48  brain]
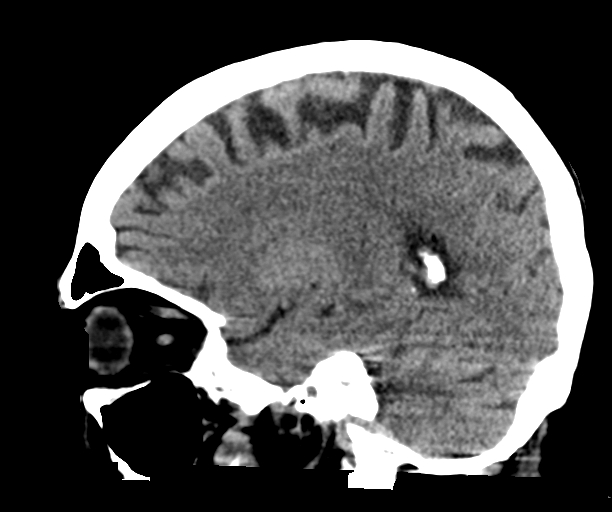
[im 24/48  brain]
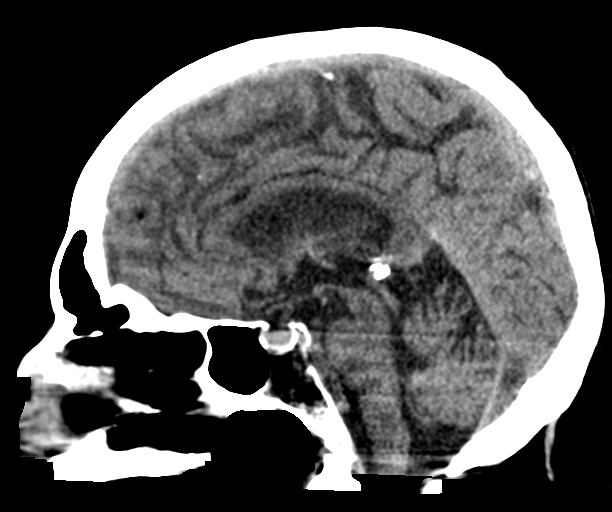
[im 32/48  brain]
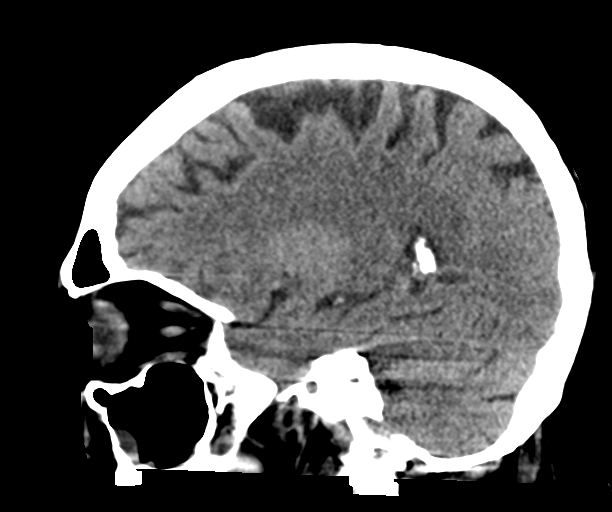

[16 of 47 positions shown; findings below may reference images not displayed]

FINDINGS: Brain: Mild atrophy. Normal ventricular morphology. No midline shift
or mass effect. Otherwise normal appearance of brain parenchyma. No
intracranial hemorrhage, mass lesion or evidence of acute
infarction. No extra-axial fluid collections.

Vascular: Atherosclerotic calcification of internal carotid arteries
at skull base

Skull: Bones appear demineralized but intact

Sinuses/Orbits: Small amount of fluid dependently in RIGHT sphenoid
sinus. Remaining paranasal sinuses and mastoid air cells clear.

Other: N/A
IMPRESSION: No acute intracranial abnormalities.

## 2019-10-31 IMAGING — CR DG CHEST 2V
1 series · 2 of 2 positions shown · non-contrast
Comparison: 01/05/2018

CLINICAL DATA: Congestion, productive cough, pneumonia, shortness
of breath, chest pain, history CHF, COPD, cardiomyopathy

EXAM:
CHEST - 2 VIEW

[Series 1: dg chest 2 view · 0.14mm/px · 2 of 2 slices shown]
[im 1/2]
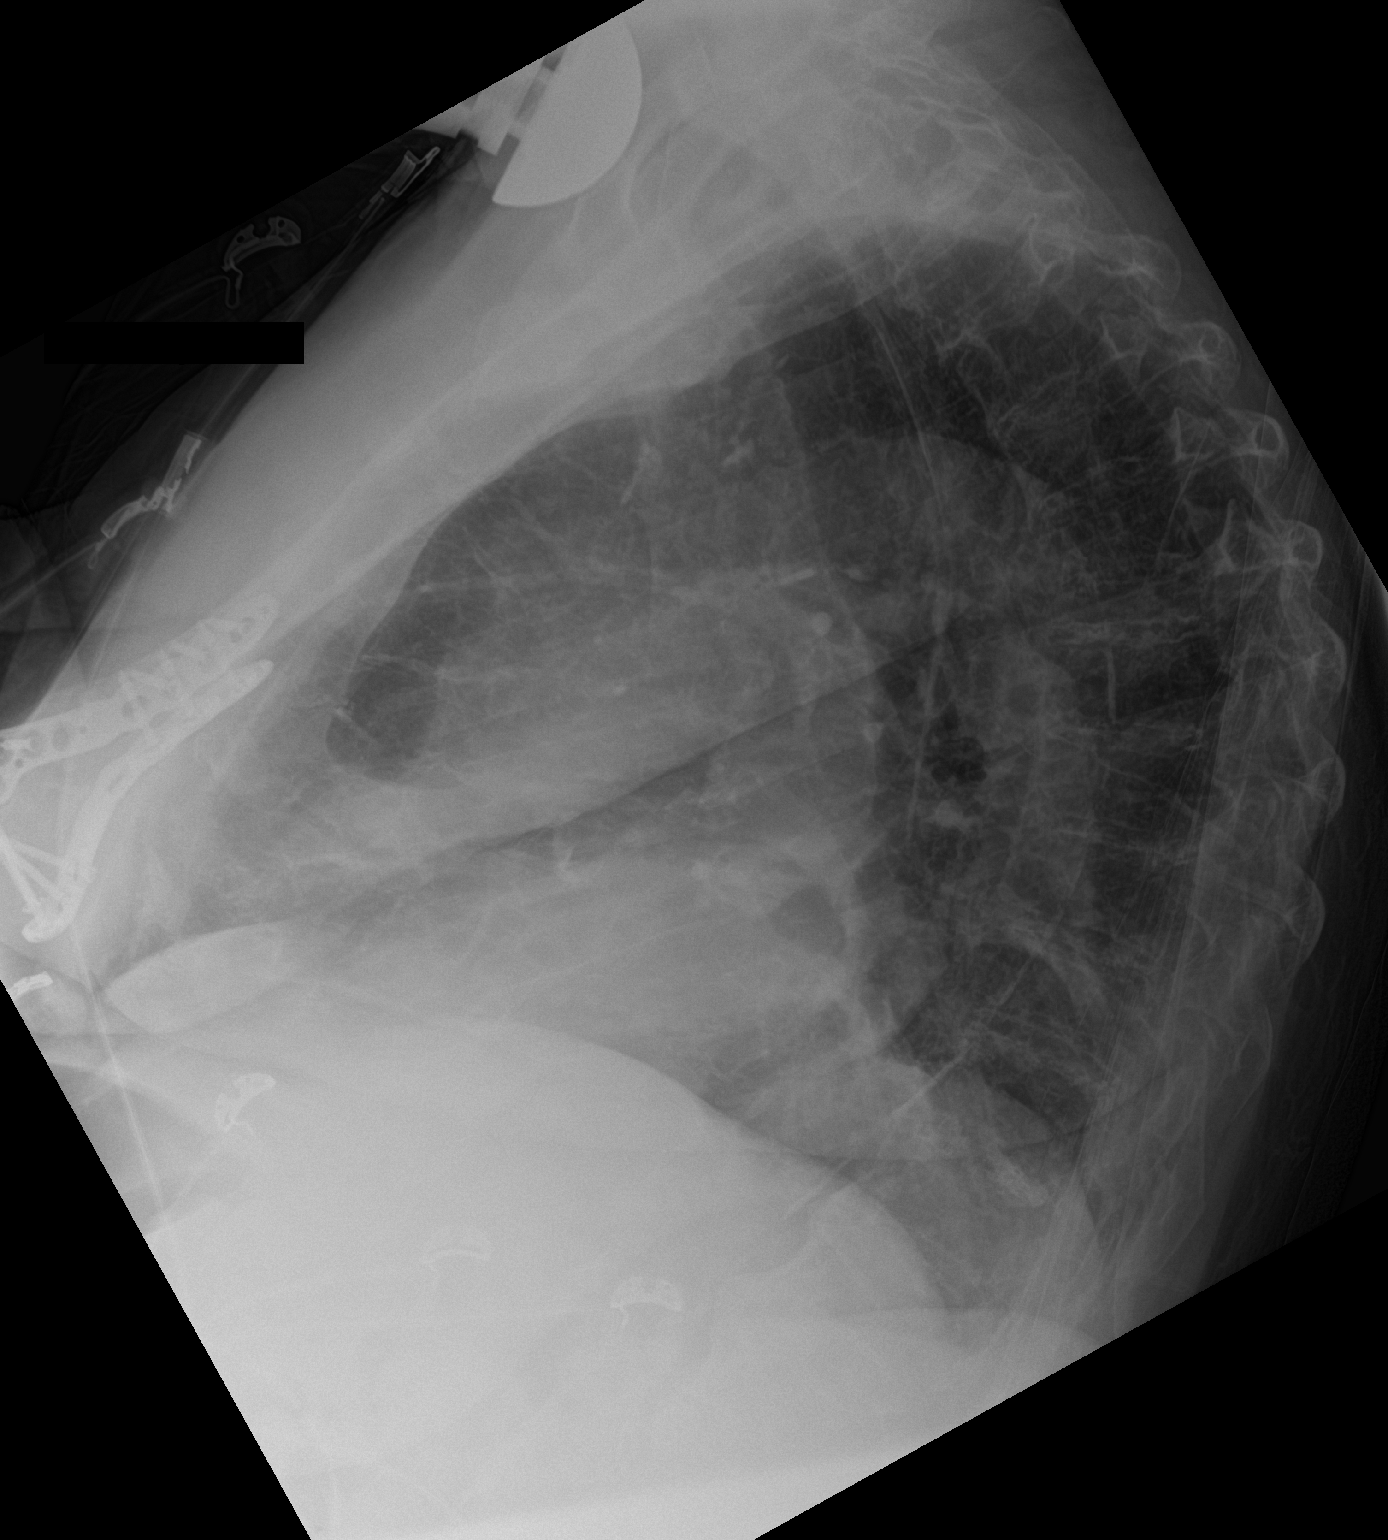
[im 2/2]
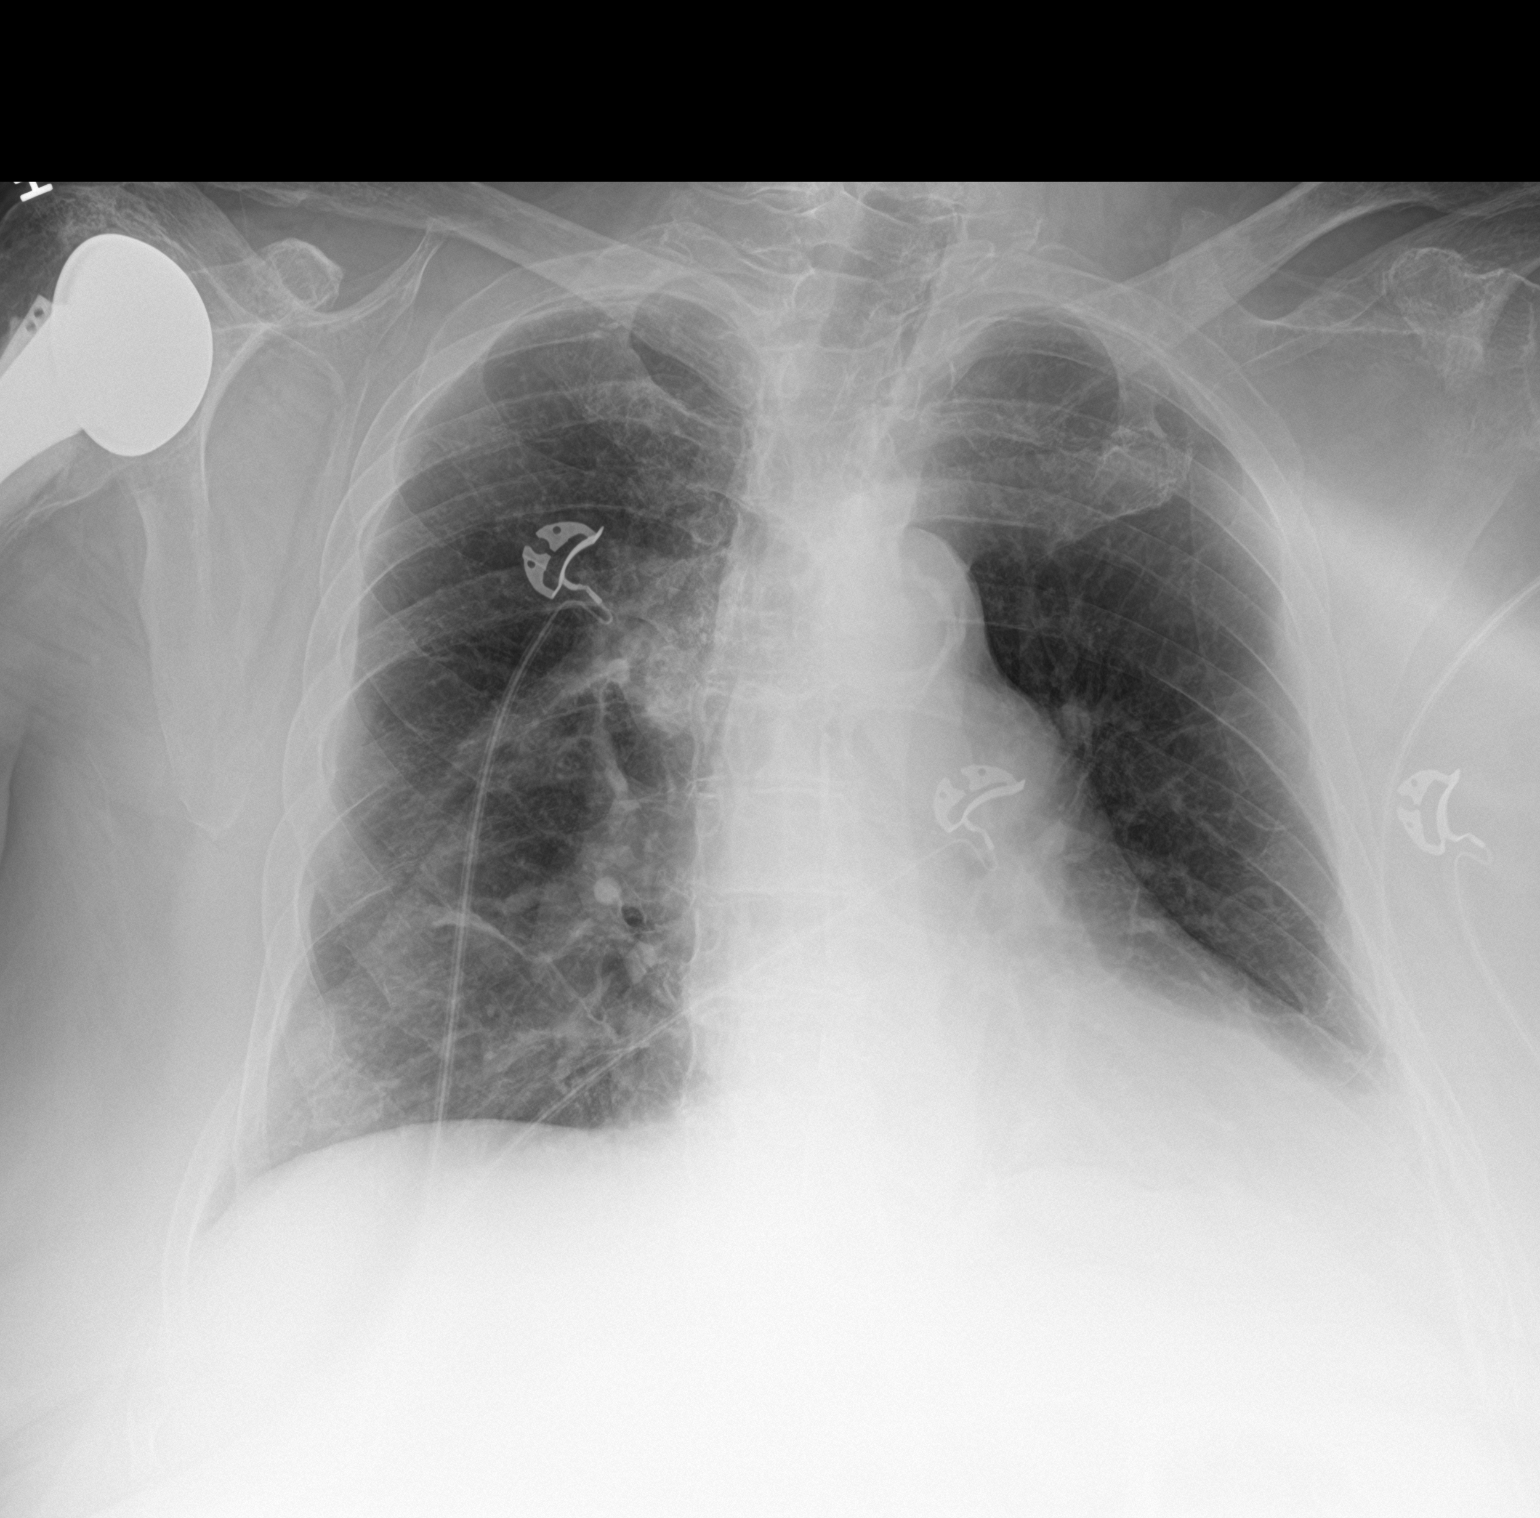

[2 of 2 positions shown; findings below may reference images not displayed]

FINDINGS: Slightly rotated to the LEFT.

Borderline enlargement of cardiac silhouette.

Atherosclerotic calcification aorta.

Enlarged central pulmonary arteries question pulmonary arterial
hypertension.

Bibasilar and RIGHT mid lung atelectasis.

No definite acute infiltrate, pleural effusion or pneumothorax.

Abnormal RIGHT paramediastinal density superior to the RIGHT hilum
at the level of the aortic arch is likely related to the marked
compression fracture of the T6 vertebral body with paraspinal
calcific debris and spurring as noted on prior CT exams, accentuated
by rotation to the LEFT.

Bones demineralized with note of a RIGHT shoulder prosthesis.
IMPRESSION: Bibasilar atelectasis.

No new abnormalities.

## 2019-10-31 IMAGING — CT CT CHEST W/O CM
2 of 3 series · 15 of 36 positions shown, 18 images · non-contrast
Comparison: Chest radiograph from earlier today. 11/26/2017 chest
CT.

CLINICAL DATA: COPD exacerbation.  Dyspnea.  Productive cough.

EXAM:
CT CHEST WITHOUT CONTRAST
TECHNIQUE: Multidetector CT imaging of the chest was performed following the
standard protocol without IV contrast.

[Series 2: thorax · axial · 0.81mm/px · z∈[-677,-445]mm · 12 of 138 slices shown, 15 images]
[im 11/138  mediastinal]
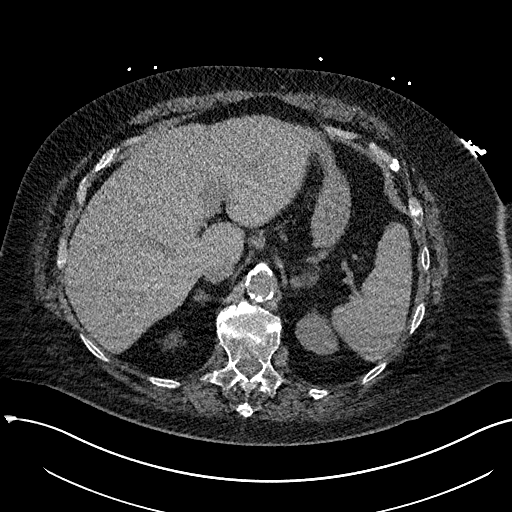
[im 11/138  lung]
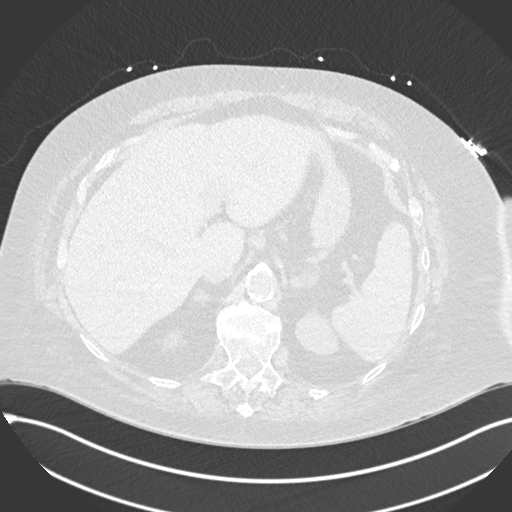
[im 21/138  lung]
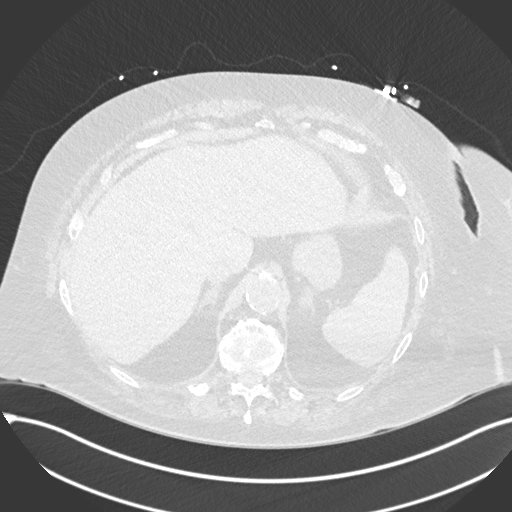
[im 31/138  lung]
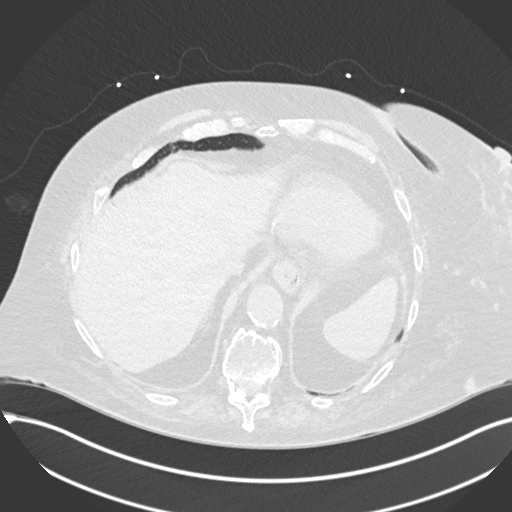
[im 41/138  lung]
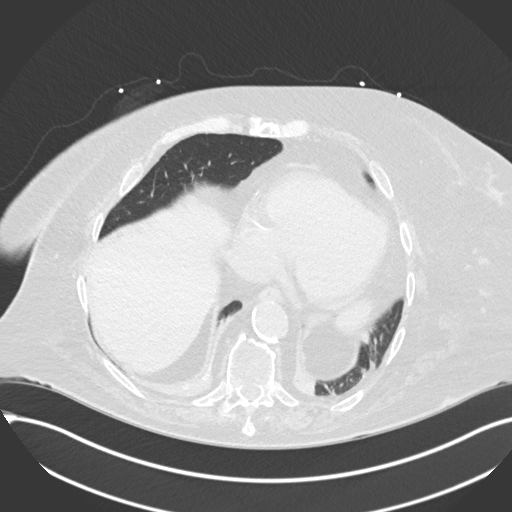
[im 51/138  mediastinal]
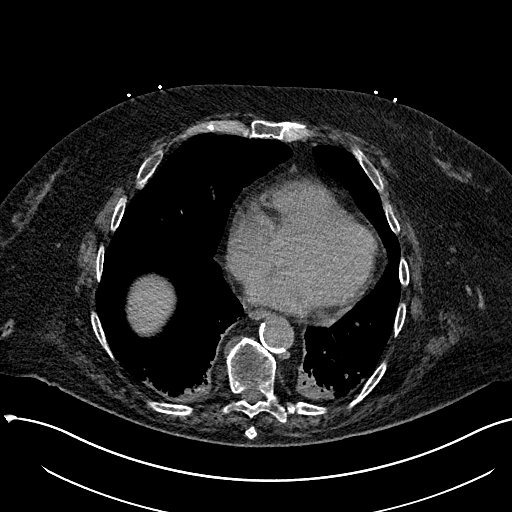
[im 51/138  lung]
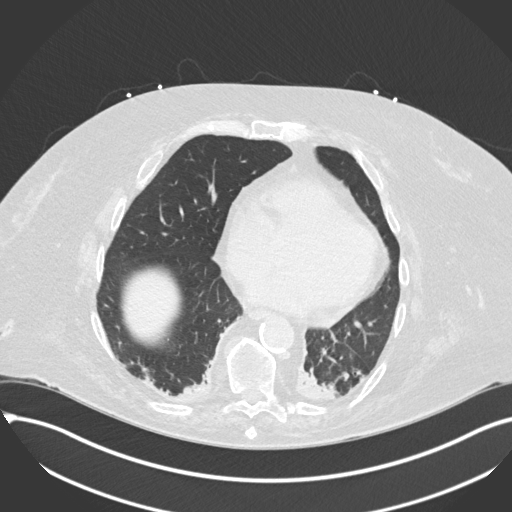
[im 61/138  lung]
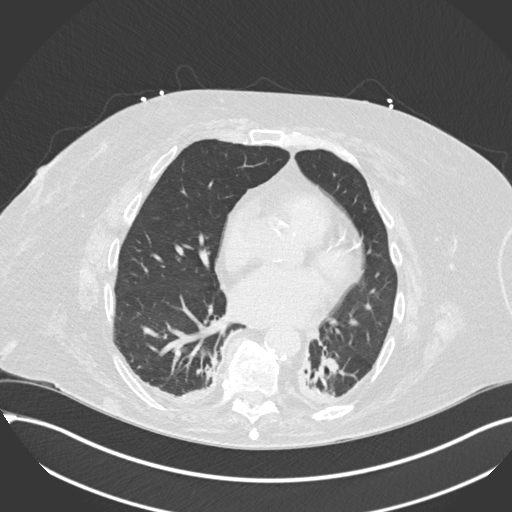
[im 77/138  lung]
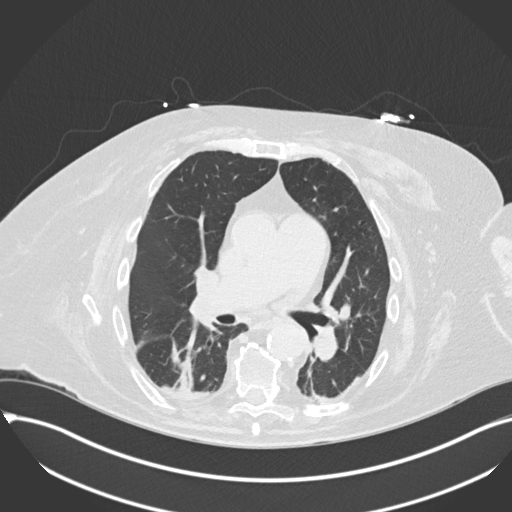
[im 87/138  lung]
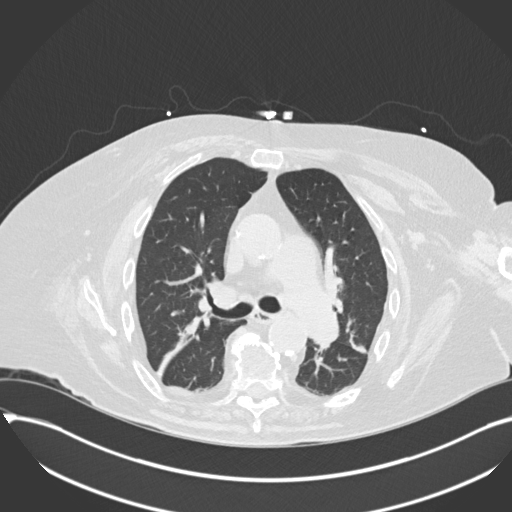
[im 97/138  mediastinal]
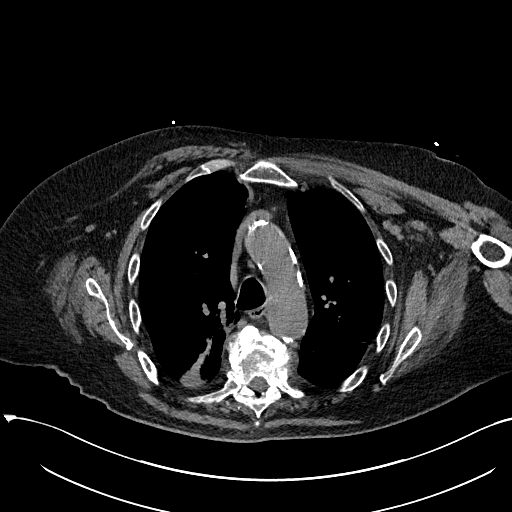
[im 97/138  lung]
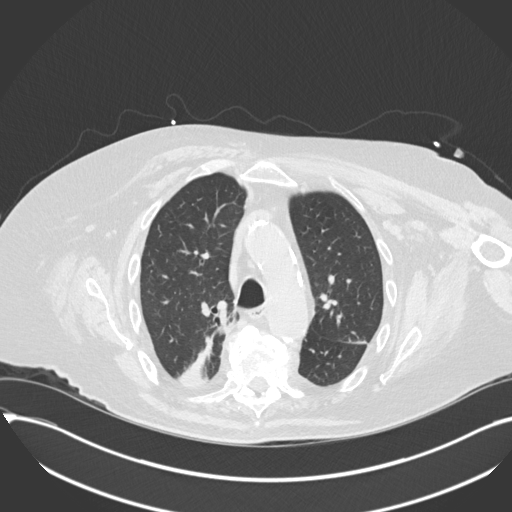
[im 107/138  lung]
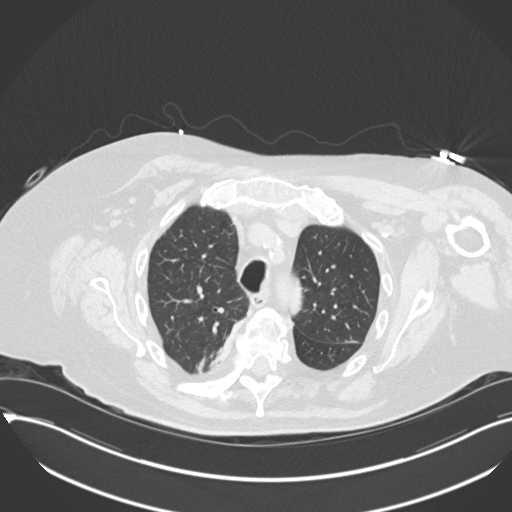
[im 117/138  lung]
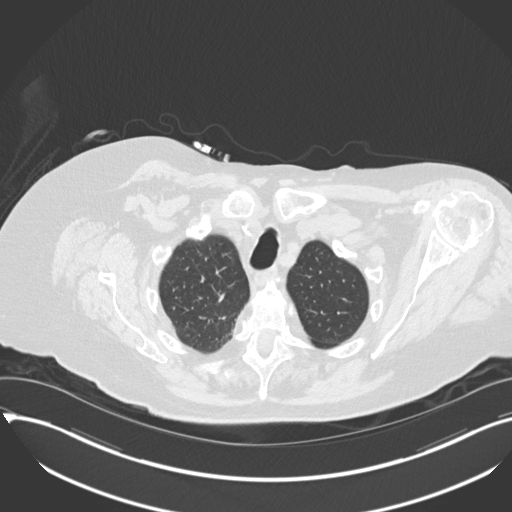
[im 127/138  lung]
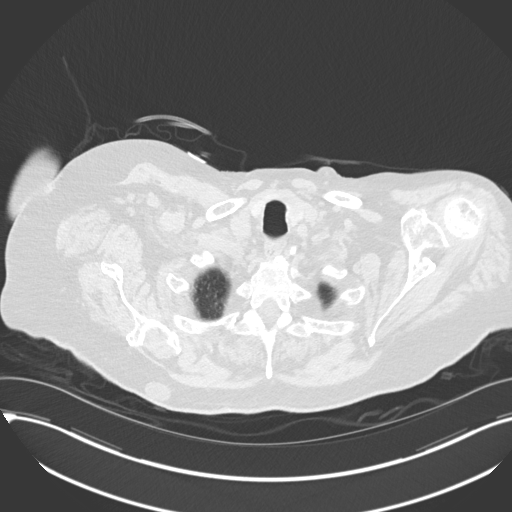

[Series 5: coronal · coronal · 0.59mm/px · 3 of 145 slices shown]
[im 29/145  lung]
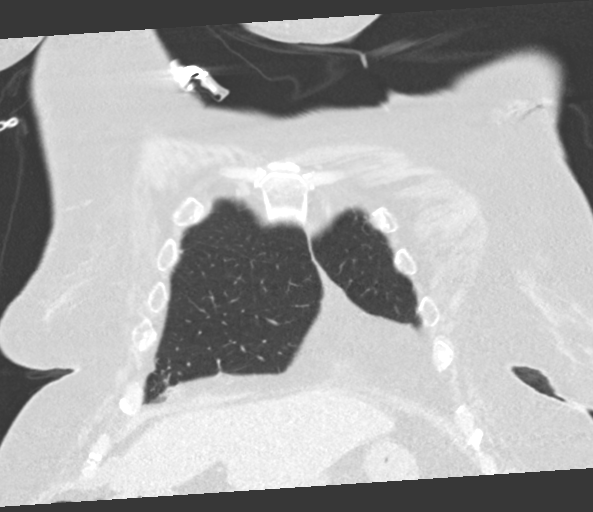
[im 58/145  lung]
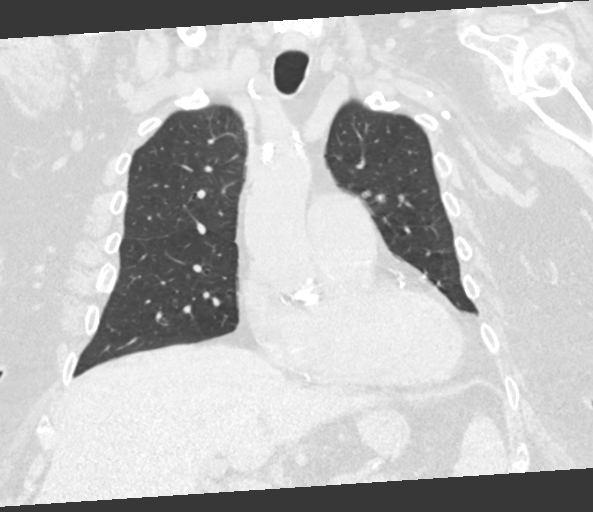
[im 87/145  lung]
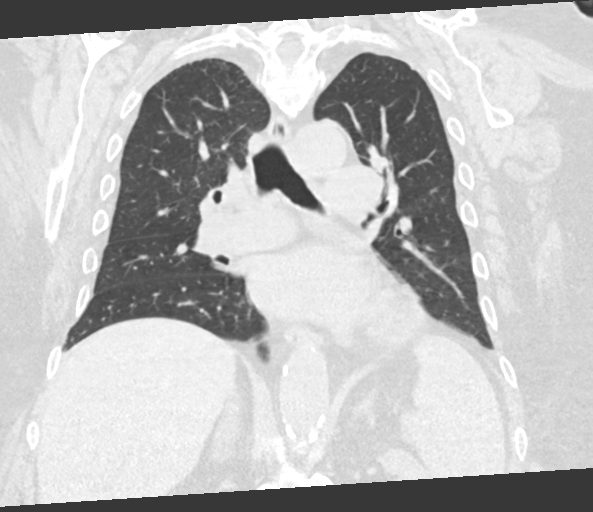

[15 of 36 positions shown; findings below may reference images not displayed]

FINDINGS: Cardiovascular: Normal heart size. No significant pericardial
fluid/thickening. Left main and 3 vessel coronary atherosclerosis.
Atherosclerotic nonaneurysmal thoracic aorta. Stable prominently
dilated main pulmonary artery (4.2 cm diameter).

Mediastinum/Nodes: Hypodense 1.0 cm left thyroid lobe nodule.
Unremarkable esophagus. No pathologically enlarged axillary,
mediastinal or gross hilar lymph nodes, noting limited sensitivity
for the detection of hilar adenopathy on this noncontrast study.

Lungs/Pleura: No pneumothorax. No pleural effusion. Chronic bandlike
opacities in the dependent right upper lobe and dependent/medial
bilateral lower lobes with associated volume loss, compatible with
chronic postinfectious/postinflammatory scarring. No acute
consolidative airspace disease or lung masses. Sub solid 4 mm right
middle lobe pulmonary nodule (series 3/image 80) is stable since
10/24/2017 chest CT. No new significant pulmonary nodules.

Upper abdomen: Small hiatal hernia. Stable thickening of the left
greater than right adrenal glands without discrete adrenal nodules.

Musculoskeletal: No aggressive appearing focal osseous lesions.
Stable chronic severe T6 vertebral compression fracture. Marked
thoracic spondylosis. Partially visualized right shoulder
arthroplasty. Stable subcutaneous upper right back 1.8 cm sebaceous
cyst.
IMPRESSION: 1. Stable chronic postinfectious/postinflammatory scarring in the
dependent right upper lobe and dependent bilateral lower lobes. No
superimposed acute consolidative airspace disease.
2. Sub solid 4 mm right middle lobe pulmonary nodule, stable since
10/24/2017 chest CT, probably benign. No follow-up recommended. This
recommendation follows the consensus statement: Guidelines for
Management of Incidental Pulmonary Nodules Detected on CT Images:
3. Stable prominently dilated main pulmonary artery, suggesting
chronic pulmonary arterial hypertension.
4. Left main and 3 vessel coronary atherosclerosis.
5. Small hiatal hernia.

Aortic Atherosclerosis ([id number]).

## 2019-11-23 IMAGING — DX DG CHEST 1V PORT
1 series · 1 of 1 positions shown · non-contrast
Comparison: Radiograph April 15, 2018.

CLINICAL DATA: Central line placement.

EXAM:
PORTABLE CHEST 1 VIEW

[chest ap]
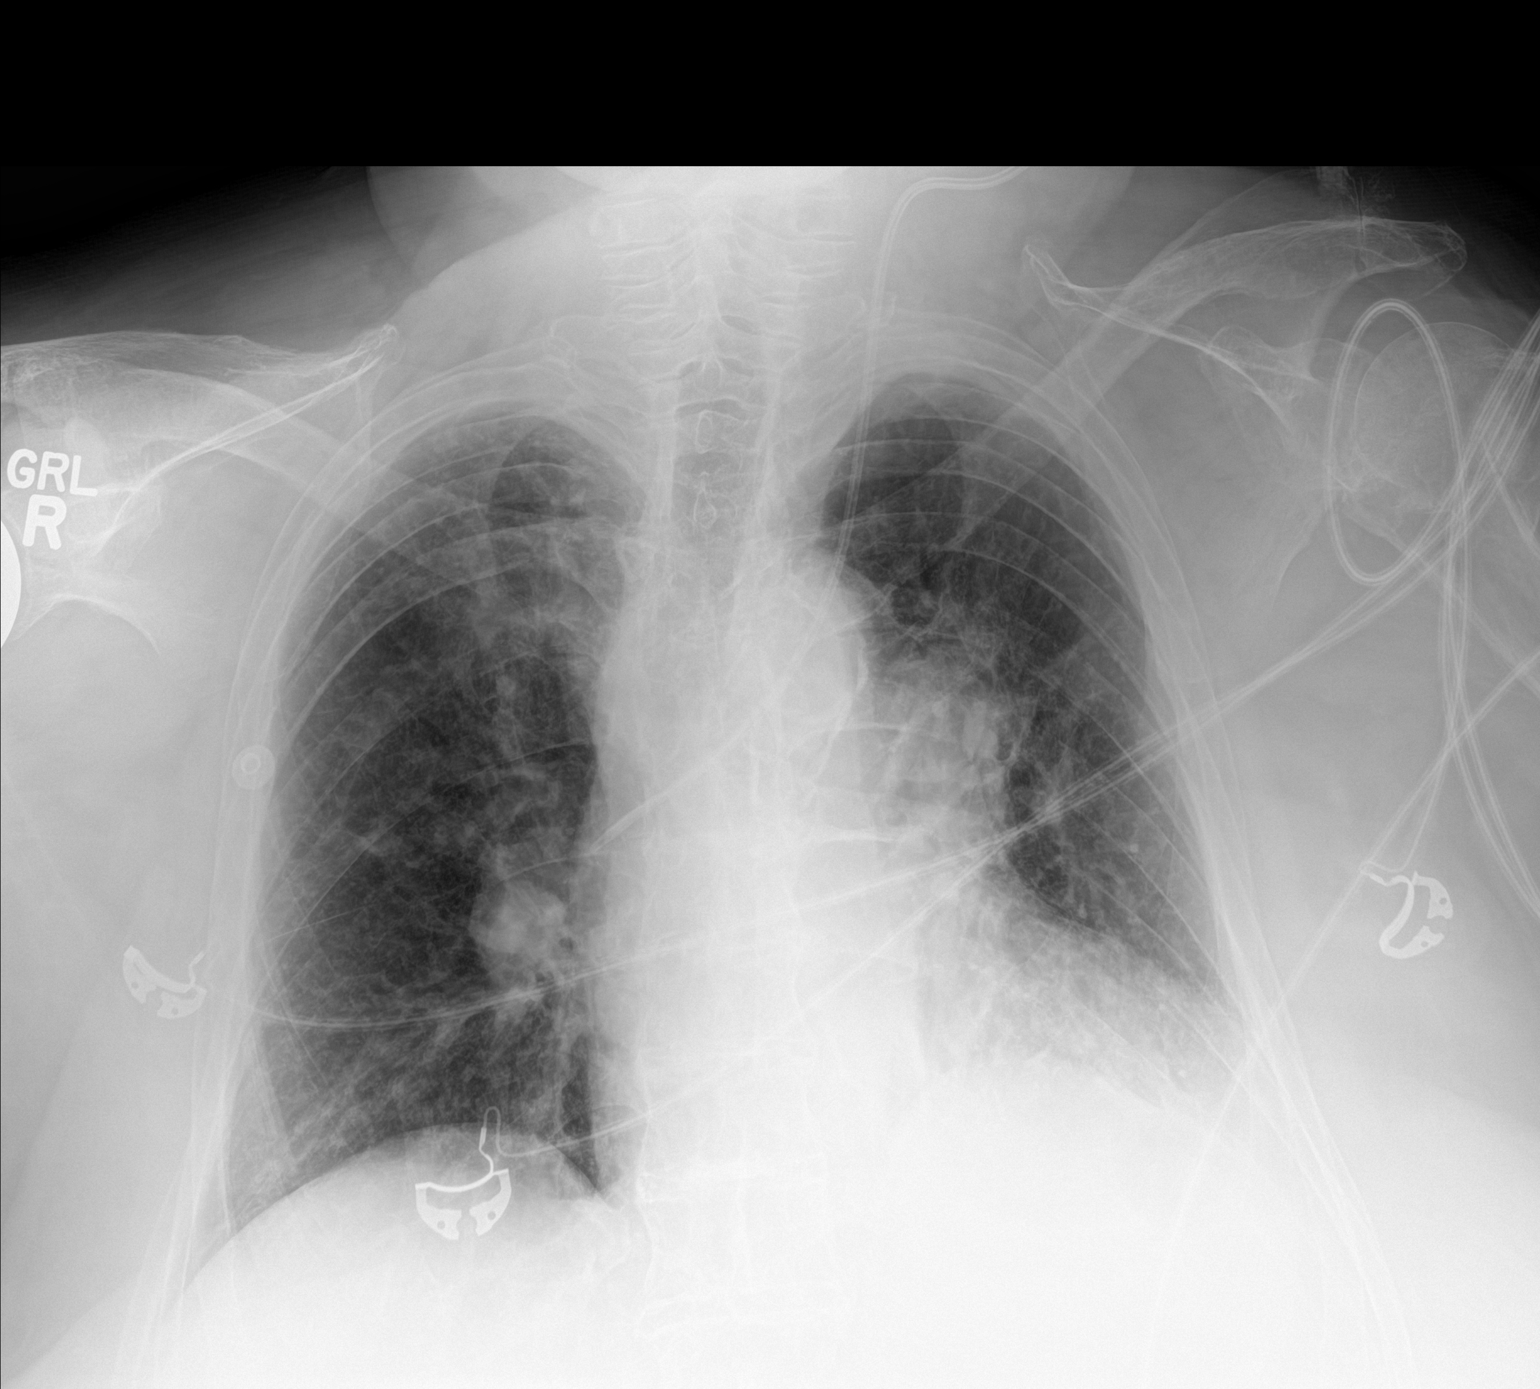

[1 of 1 positions shown; findings below may reference images not displayed]

FINDINGS: Stable cardiomediastinal silhouette. Atherosclerosis of thoracic
aorta is noted. Interval placement of left internal jugular catheter
with distal tip in expected position of the SVC. No pneumothorax is
noted. Right lung is clear. Mild left basilar subsegmental
atelectasis or scarring is noted with possible minimal left pleural
effusion. Bony thorax is unremarkable.
IMPRESSION: Interval placement of left internal jugular catheter with distal tip
in expected position of the SVC. No pneumothorax is noted. Mild left
basilar subsegmental atelectasis or scarring is noted with possible
minimal left pleural effusion.

## 2019-11-23 IMAGING — DX DG CHEST 1V PORT
1 series · 1 of 1 positions shown · non-contrast
Comparison: Chest CT dated 03/23/2018

CLINICAL DATA: 76-year-old female with cough.

EXAM:
PORTABLE CHEST 1 VIEW

[chest ap]
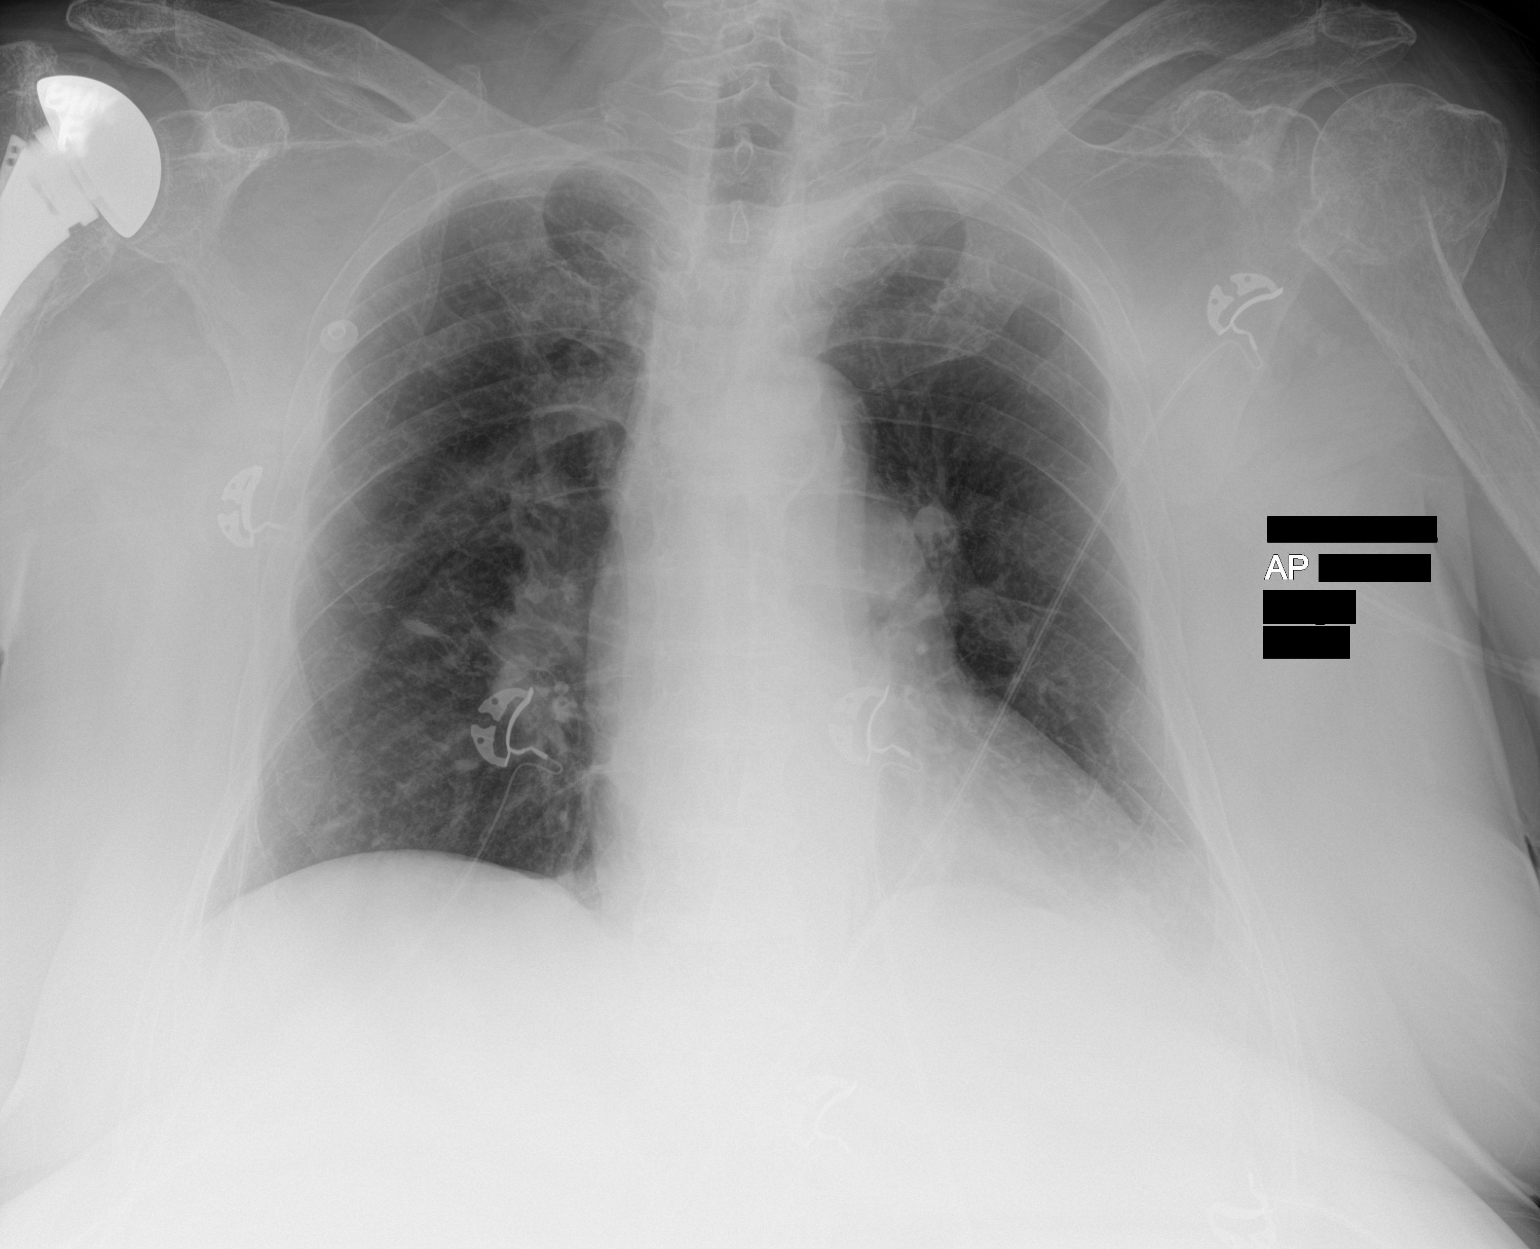

[1 of 1 positions shown; findings below may reference images not displayed]

FINDINGS: There is no focal consolidation, pleural effusion, or pneumothorax.
The cardiac silhouette is within normal limits. Atherosclerotic
calcification of the aorta. Old healed left humeral neck fracture
and right shoulder hemiarthroplasty. No acute osseous pathology.
IMPRESSION: No active disease.

## 2019-11-25 IMAGING — DX DG CHEST 1V PORT
1 series · 1 of 1 positions shown · non-contrast
Comparison: 04/15/2018

CLINICAL DATA: Follow-up pneumonia

EXAM:
PORTABLE CHEST 1 VIEW

[chest ap]
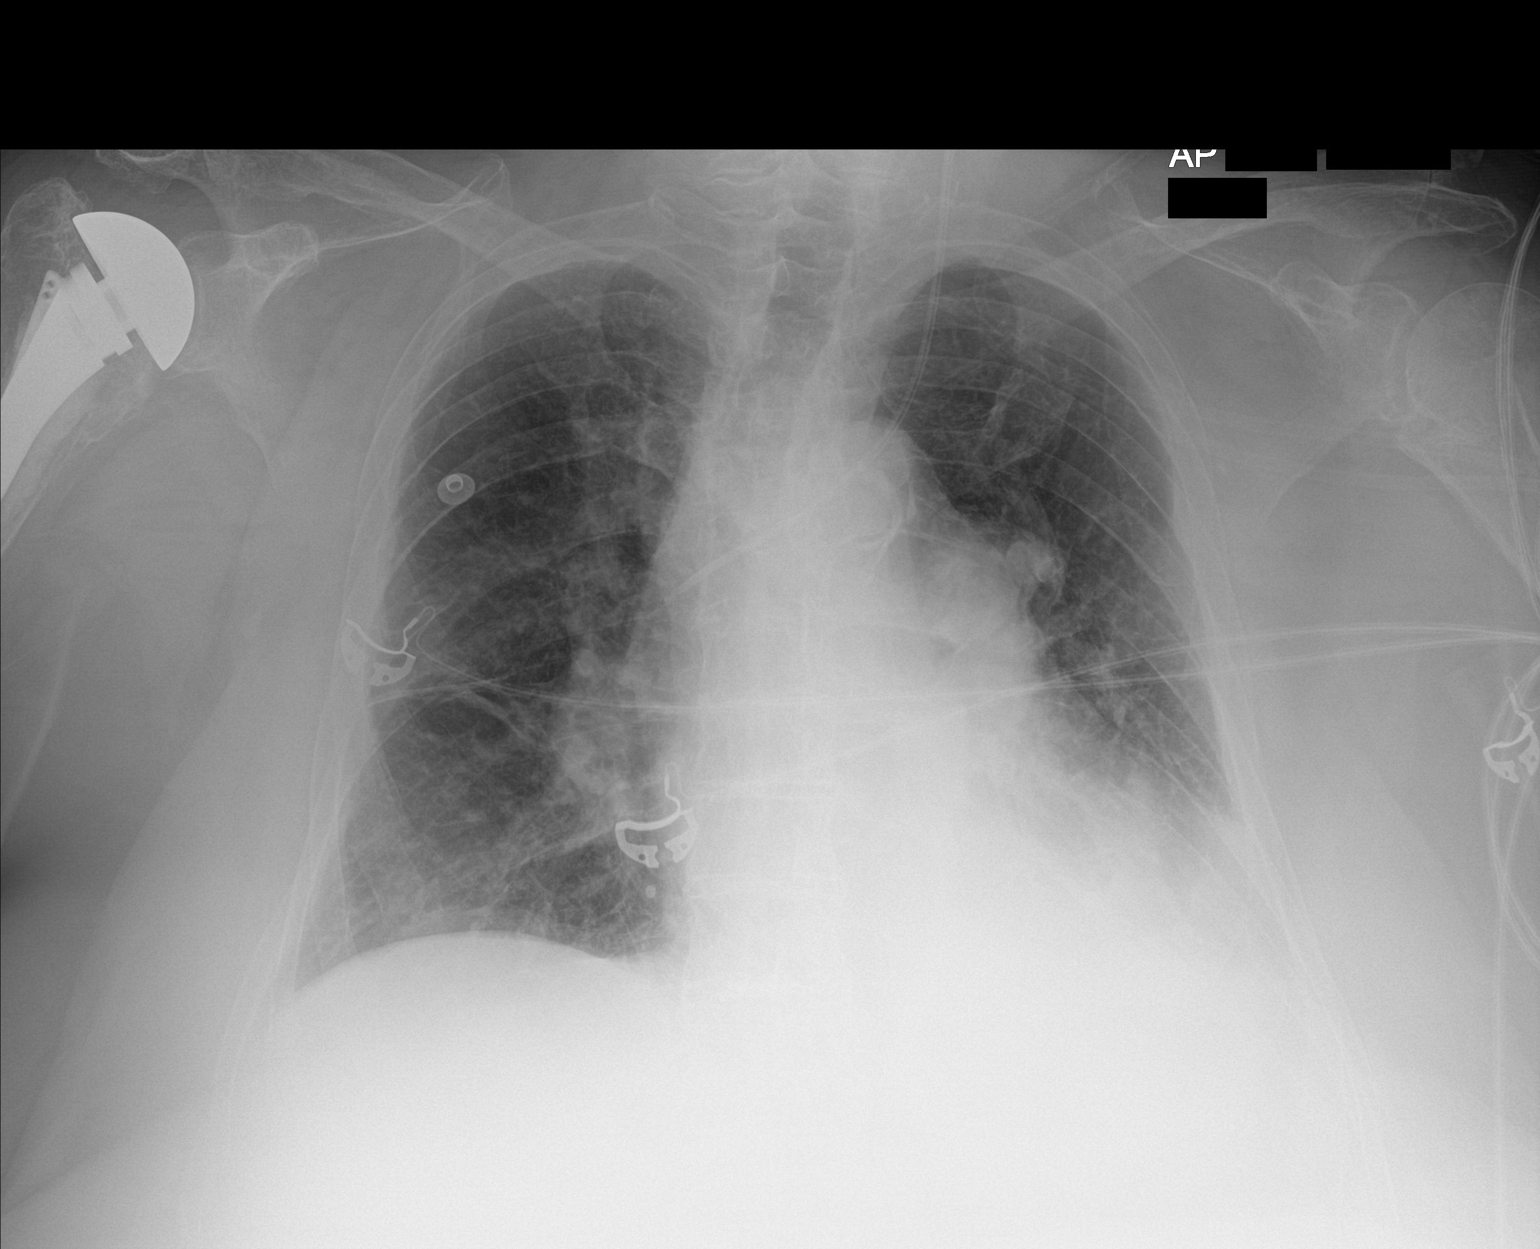

[1 of 1 positions shown; findings below may reference images not displayed]

FINDINGS: Cardiac shadow is stable. Left jugular central line is again seen.
Lungs are well aerated bilaterally. Persistent changes in the left
base are seen. No new focal infiltrate is noted. Postsurgical
changes in the right shoulder are seen.
IMPRESSION: Stable left basilar changes.  No acute abnormality noted.

## 2019-11-29 IMAGING — CR DG CHEST 2V
2 series · 3 of 3 positions shown · non-contrast
Comparison: 04/17/2018

CLINICAL DATA: 76-year-old with increased shortness of breath and
cough.

EXAM:
CHEST - 2 VIEW

[Series 2: chest lat · 0.14mm/px · 2 of 2 slices shown]
[im 1/2]
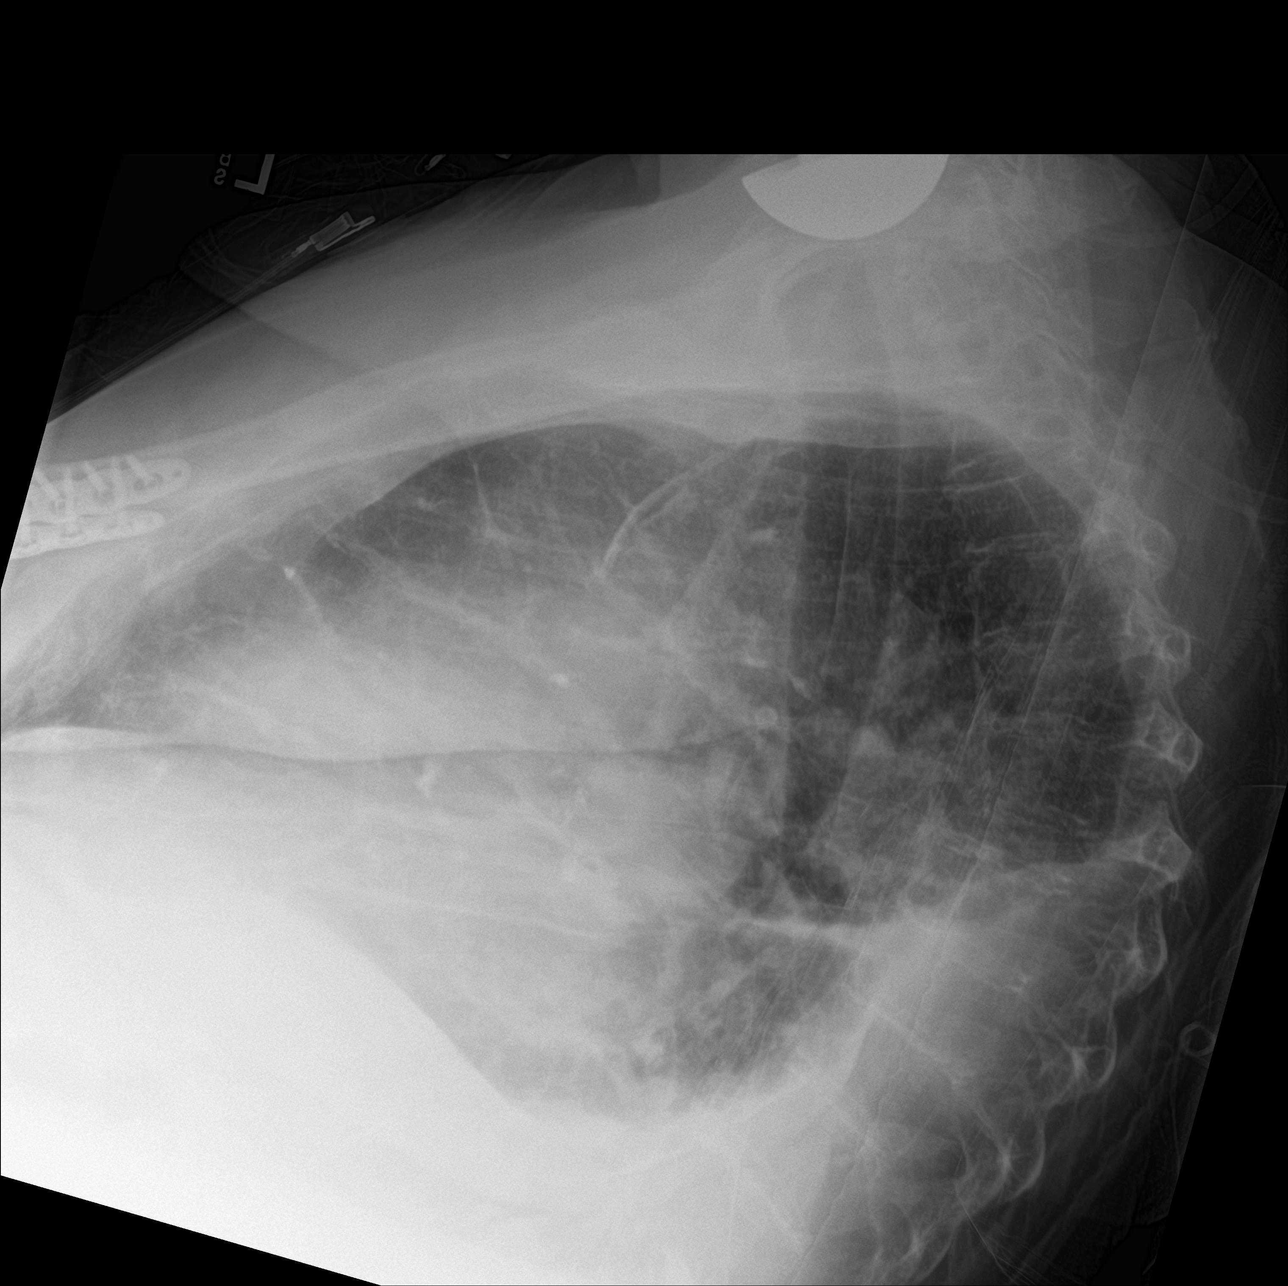
[im 2/2]
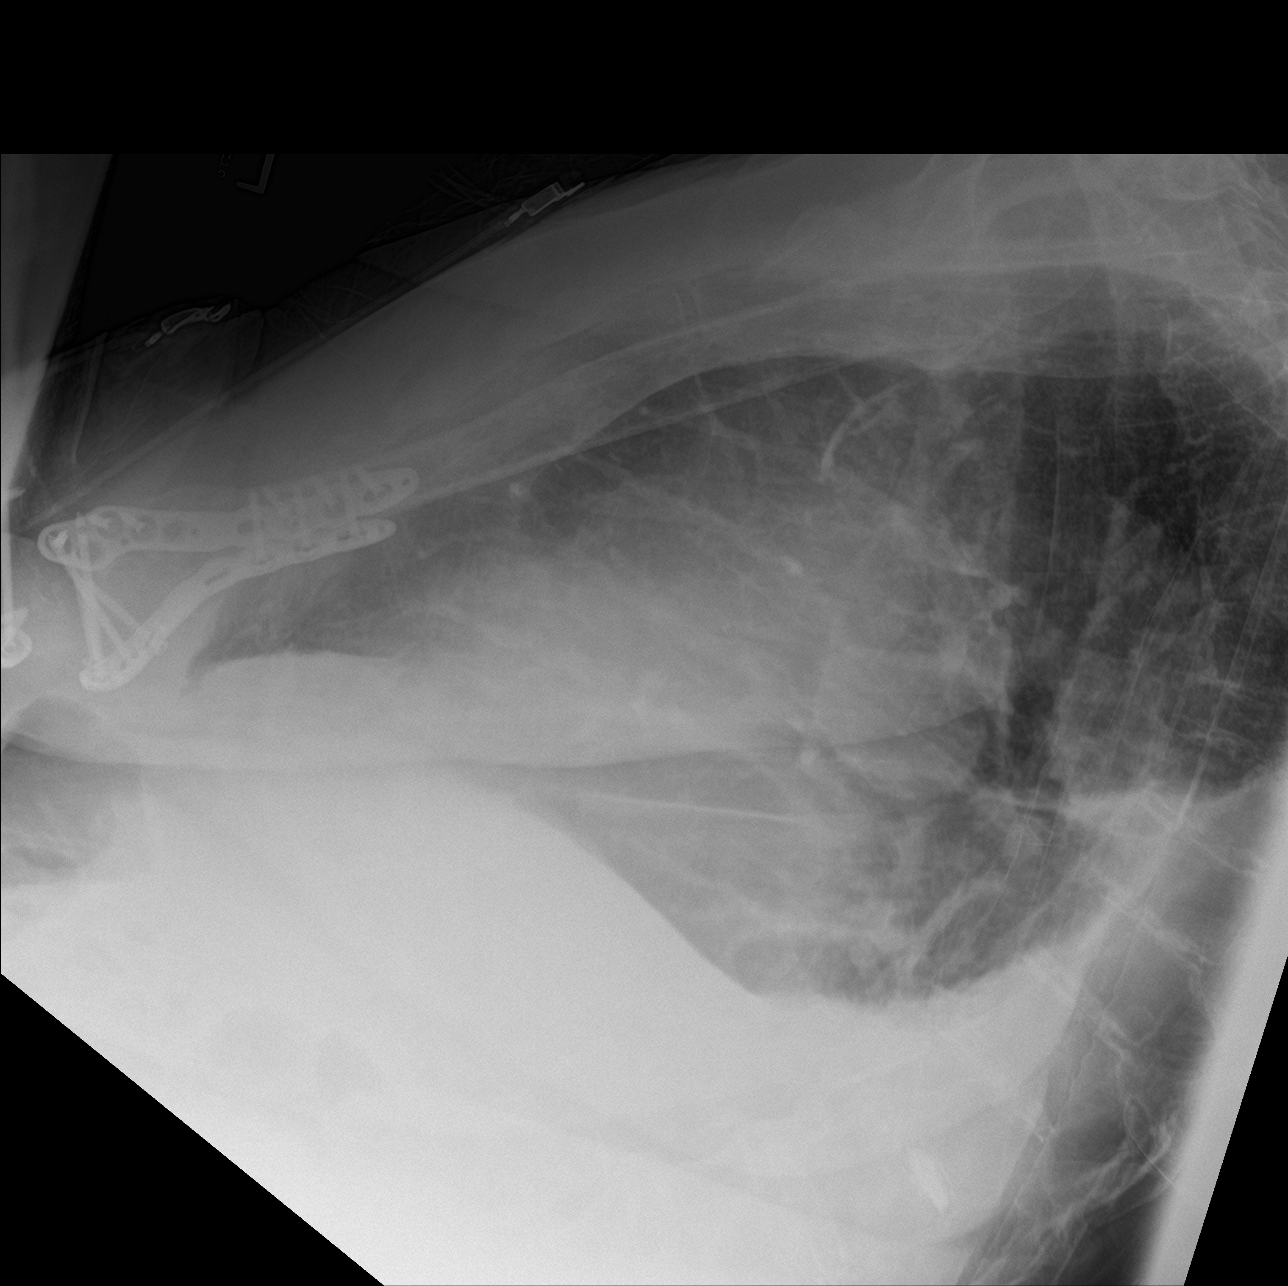

[chest ap]
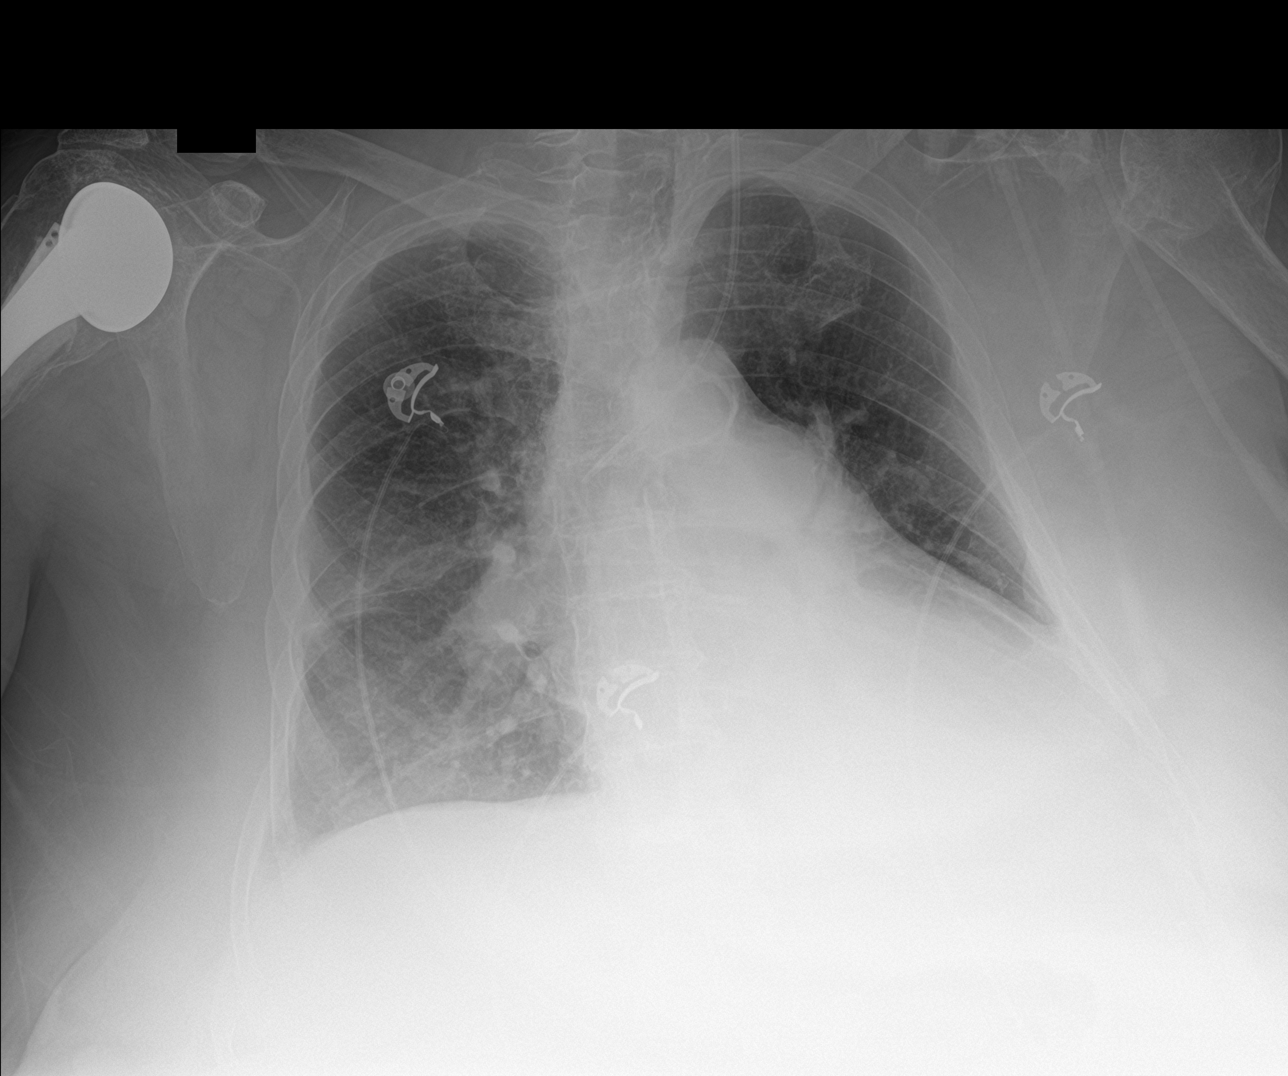

[3 of 3 positions shown; findings below may reference images not displayed]

FINDINGS: Again noted is a left jugular central line. The catheter tip is near
the junction of left innominate vein and SVC. Persistent densities
at the left lung base. Patchy hazy densities in the mid and lower
right lung. Heart size remains upper limits of normal.
Atherosclerotic calcifications at the aortic arch. Negative for a
pneumothorax. Evidence for small to moderate sized pleural effusions
based on the lateral view. Right shoulder replacement.
IMPRESSION: Bibasilar chest densities are most compatible with pleural effusions
with atelectasis/consolidation.

Stable position of central line with the tip near the junction of
the SVC and left innominate vein.

## 2020-01-08 ENCOUNTER — Non-Acute Institutional Stay: Payer: Medicare Other | Admitting: Hospice

## 2020-01-08 ENCOUNTER — Other Ambulatory Visit: Payer: Self-pay

## 2020-01-08 DIAGNOSIS — I5032 Chronic diastolic (congestive) heart failure: Secondary | ICD-10-CM

## 2020-01-08 DIAGNOSIS — Z515 Encounter for palliative care: Secondary | ICD-10-CM

## 2020-01-08 NOTE — Progress Notes (Signed)
[facility] Consult Note Telephone: [phone]  Fax: [phone]  PATIENT NAME: Sara Pierce DOB: 01-25-1941 MRN: [id number]  PRIMARY CARE PROVIDER:   Dr. Jules Husbands  REFERRING PROVIDER:  Dr Jules Husbands RESPONSIBLE PARTY:   Self Contact_ Spouse Spaziani Parminder [phone]   RECOMMENDATIONS/PLAN:   Advance Care Planning/Goals of Care: Visit consisted of building trust and discussions on Palliative Medicine as specialized medical care for people living with serious illness, aimed at facilitating better quality of life through symptoms relief, assisting with advance care plan and establishing goals of care. Patient affirmed she is a full code. Goals of care include to maximize quality of life and symptom management. Patient said she has been through a lot of health and family challenges but feels a lot better today. Therapeutic listening provided. Symptom management: Patient just completed Levaquin for pneumonia. Continuing on Prednisone taper. Currently on Macrobid for UTI. She denied urinary symptoms. Continues on oxygen supplementation 3L/Min for COPD and Lasix for chronic diastolic CHF. No s/s of CHF exacerbation at this time. She denied chest pain, SOB. Nursing Stacy with no concerns/complaints. Encouraged ongoing nursing care.  Follow up: Palliative care will continue to follow patient for goals of care clarification and symptom management. I spent 60  minutes providing this consultation.  More than 50% of the time in this consultation was spent on coordinating communication  HISTORY OF PRESENT ILLNESS:  Sara Pierce is a 79 y.o. year old female with multiple medical problems including chronic diastolic CHF, COPD, CKD 3. Palliative Care was asked to help address goals of care.   CODE STATUS: Full  PPS: 30% HOSPICE ELIGIBILITY/DIAGNOSIS: TBD  PAST MEDICAL HISTORY:  Past Medical History:  Diagnosis Date  . Arthritis   .  Cardiomyopathy ([place])   . Cataract   . Cerebral hemorrhage ([place])   . CHF (congestive heart failure) ([place])   . Chronic kidney disease   . Clotting disorder ([place])   . COPD (chronic obstructive pulmonary disease) ([place])   . Depression   . Diabetes mellitus without complication ([place])   . Hypertension   . Mixed incontinence   . Obesity   . Sleep apnea   . Thyroid disease   . Urinary frequency   . Vaginal atrophy   . Varicose veins     SOCIAL HX:  Social History   Tobacco Use  . Smoking status: Former Smoker    Packs/day: 0.50    Years: 44.00    Pack years: 22.00    Types: Cigarettes  . Smokeless tobacco: Never Used  Substance Use Topics  . Alcohol use: No    ALLERGIES:  Allergies  Allergen Reactions  . Aspirin Swelling  . Codeine Itching  . Naprosyn [Naproxen] Other (See Comments)    Per MAR pt allergic  . Other     Elastic Bandages/ Supports Per MAR  . Tape Hives  . Valacyclovir   . Latex Rash     PERTINENT MEDICATIONS:  Outpatient Encounter Medications as of 01/08/2020  Medication Sig  . acetaminophen (TYLENOL) 325 MG tablet Take 2 tablets (650 mg total) by mouth every 6 (six) hours as needed for mild pain (or Fever >/= 101).  . ARIPiprazole (ABILIFY) 5 MG tablet Take 5 mg by mouth at bedtime.  Marland Kitchen azithromycin (ZITHROMAX) 250 MG tablet Take 1 tablet (250 mg total) by mouth daily. Take 1 tablet 5 days per week (Monday thru Friday). (Patient not taking: Reported on 09/16/2018)  .  budesonide (PULMICORT) 0.5 MG/2ML nebulizer solution Take 2 mLs (0.5 mg total) 2 (two) times daily by nebulization.  Marland Kitchen buPROPion (WELLBUTRIN XL) 150 MG 24 hr tablet Take 150 mg by mouth daily.  . clonazePAM (KLONOPIN) 1 MG tablet Take 1 tablet (1 mg total) by mouth 2 (two) times daily. (Patient not taking: Reported on 09/16/2018)  . clopidogrel (PLAVIX) 75 MG tablet Take 1 tablet (75 mg total) by mouth daily. (Patient not taking: Reported on 09/16/2018)  . cyclobenzaprine (FLEXERIL) 10 MG tablet  Take 10 mg by mouth 3 (three) times daily as needed for muscle spasms.  Marland Kitchen FLUoxetine (PROZAC) 40 MG capsule Take 80 mg by mouth daily.  . fluticasone (FLONASE) 50 MCG/ACT nasal spray Place 2 sprays into both nostrils daily.  . furosemide (LASIX) 20 MG tablet Take 1 tablet (20 mg total) by mouth 2 (two) times daily. (Patient taking differently: Take 40 mg by mouth 2 (two) times daily. )  . gabapentin (NEURONTIN) 100 MG capsule Take 2 capsules (200 mg total) by mouth 3 (three) times daily. (Patient taking differently: Take 300 mg by mouth 3 (three) times daily. )  . guaiFENesin (MUCINEX) 600 MG 12 hr tablet Take 1 tablet (600 mg total) by mouth 2 (two) times daily. (Patient not taking: Reported on 09/16/2018)  . guaiFENesin (MUCINEX) 600 MG 12 hr tablet Take 1 tablet (600 mg total) by mouth 2 (two) times daily. (Patient not taking: Reported on 09/16/2018)  . hydrOXYzine (ATARAX/VISTARIL) 25 MG tablet Take 25 mg by mouth 3 (three) times daily as needed.  Marland Kitchen ipratropium-albuterol (DUONEB) 0.5-2.5 (3) MG/3ML SOLN Take 3 mLs by nebulization 4 (four) times daily. Dx:J44.9  . magnesium oxide (MAG-OX) 400 MG tablet Take 400 mg by mouth daily.  . mirtazapine (REMERON) 15 MG tablet Take 7.5 mg by mouth at bedtime.  . montelukast (SINGULAIR) 10 MG tablet Take 10 mg by mouth at bedtime.   Marland Kitchen omeprazole (PRILOSEC) 20 MG capsule Take 20 mg by mouth daily as needed.   . polyethylene glycol (MIRALAX / GLYCOLAX) packet Take 17 g by mouth daily.  . potassium chloride (K-DUR) 10 MEQ tablet Take 10 mEq by mouth daily.   . primidone (MYSOLINE) 50 MG tablet Take 50 mg by mouth at bedtime.   . simvastatin (ZOCOR) 40 MG tablet Take 40 mg by mouth at bedtime.   Marland Kitchen umeclidinium-vilanterol (ANORO ELLIPTA) 62.5-25 MCG/INH AEPB Inhale 1 puff into the lungs daily.   No facility-administered encounter medications on file as of 01/08/2020.    PHYSICAL EXAM:   General: NAD, frail appearing, thin Cardiovascular: denied chest pain;  regular rate and rhythm Pulmonary:on 0n 3L/Min, normal respiratory effort, no adventitious lung sounds GI: non tender, +bowel sounds Extremities: no edema Skin: skin is warm, no rashes to exposed skin Neurological: Weakness but otherwise nonfocal  Teodoro Spray, NP

## 2020-01-16 ENCOUNTER — Emergency Department (HOSPITAL_COMMUNITY): Payer: Medicare Other

## 2020-01-16 ENCOUNTER — Inpatient Hospital Stay (HOSPITAL_COMMUNITY)
Admission: EM | Admit: 2020-01-16 | Discharge: 2020-01-22 | DRG: 371 | Disposition: A | Discharge status: Skilled Nursing Facility | Payer: Medicare Other | Source: Skilled Nursing Facility | Attending: Internal Medicine | Admitting: Internal Medicine

## 2020-01-16 DIAGNOSIS — E876 Hypokalemia: Secondary | ICD-10-CM | POA: Diagnosis not present

## 2020-01-16 DIAGNOSIS — E039 Hypothyroidism, unspecified: Secondary | ICD-10-CM | POA: Diagnosis present

## 2020-01-16 DIAGNOSIS — J9811 Atelectasis: Secondary | ICD-10-CM | POA: Diagnosis present

## 2020-01-16 DIAGNOSIS — A0472 Enterocolitis due to Clostridium difficile, not specified as recurrent: Principal | ICD-10-CM | POA: Diagnosis present

## 2020-01-16 DIAGNOSIS — N179 Acute kidney failure, unspecified: Secondary | ICD-10-CM | POA: Diagnosis present

## 2020-01-16 DIAGNOSIS — J9621 Acute and chronic respiratory failure with hypoxia: Secondary | ICD-10-CM | POA: Diagnosis present

## 2020-01-16 DIAGNOSIS — R0602 Shortness of breath: Secondary | ICD-10-CM

## 2020-01-16 DIAGNOSIS — R062 Wheezing: Secondary | ICD-10-CM

## 2020-01-16 DIAGNOSIS — Z8249 Family history of ischemic heart disease and other diseases of the circulatory system: Secondary | ICD-10-CM

## 2020-01-16 DIAGNOSIS — I2510 Atherosclerotic heart disease of native coronary artery without angina pectoris: Secondary | ICD-10-CM | POA: Diagnosis present

## 2020-01-16 DIAGNOSIS — E785 Hyperlipidemia, unspecified: Secondary | ICD-10-CM | POA: Diagnosis present

## 2020-01-16 DIAGNOSIS — K529 Noninfective gastroenteritis and colitis, unspecified: Secondary | ICD-10-CM

## 2020-01-16 DIAGNOSIS — R001 Bradycardia, unspecified: Secondary | ICD-10-CM | POA: Diagnosis present

## 2020-01-16 DIAGNOSIS — I5042 Chronic combined systolic (congestive) and diastolic (congestive) heart failure: Secondary | ICD-10-CM | POA: Diagnosis present

## 2020-01-16 DIAGNOSIS — D62 Acute posthemorrhagic anemia: Secondary | ICD-10-CM | POA: Diagnosis present

## 2020-01-16 DIAGNOSIS — I480 Paroxysmal atrial fibrillation: Secondary | ICD-10-CM | POA: Diagnosis present

## 2020-01-16 DIAGNOSIS — Z886 Allergy status to analgesic agent status: Secondary | ICD-10-CM

## 2020-01-16 DIAGNOSIS — E1122 Type 2 diabetes mellitus with diabetic chronic kidney disease: Secondary | ICD-10-CM | POA: Diagnosis present

## 2020-01-16 DIAGNOSIS — L89212 Pressure ulcer of right hip, stage 2: Secondary | ICD-10-CM | POA: Diagnosis present

## 2020-01-16 DIAGNOSIS — Z87891 Personal history of nicotine dependence: Secondary | ICD-10-CM

## 2020-01-16 DIAGNOSIS — Z9981 Dependence on supplemental oxygen: Secondary | ICD-10-CM

## 2020-01-16 DIAGNOSIS — Z66 Do not resuscitate: Secondary | ICD-10-CM

## 2020-01-16 DIAGNOSIS — E875 Hyperkalemia: Secondary | ICD-10-CM | POA: Diagnosis present

## 2020-01-16 DIAGNOSIS — I700 Atherosclerosis of aorta: Secondary | ICD-10-CM | POA: Diagnosis present

## 2020-01-16 DIAGNOSIS — R680 Hypothermia, not associated with low environmental temperature: Secondary | ICD-10-CM | POA: Diagnosis present

## 2020-01-16 DIAGNOSIS — D539 Nutritional anemia, unspecified: Secondary | ICD-10-CM | POA: Diagnosis present

## 2020-01-16 DIAGNOSIS — N189 Chronic kidney disease, unspecified: Secondary | ICD-10-CM

## 2020-01-16 DIAGNOSIS — N182 Chronic kidney disease, stage 2 (mild): Secondary | ICD-10-CM | POA: Diagnosis present

## 2020-01-16 DIAGNOSIS — Z7952 Long term (current) use of systemic steroids: Secondary | ICD-10-CM

## 2020-01-16 DIAGNOSIS — I130 Hypertensive heart and chronic kidney disease with heart failure and stage 1 through stage 4 chronic kidney disease, or unspecified chronic kidney disease: Secondary | ICD-10-CM | POA: Diagnosis present

## 2020-01-16 DIAGNOSIS — R059 Cough, unspecified: Secondary | ICD-10-CM

## 2020-01-16 DIAGNOSIS — R627 Adult failure to thrive: Secondary | ICD-10-CM | POA: Diagnosis not present

## 2020-01-16 DIAGNOSIS — R05 Cough: Secondary | ICD-10-CM

## 2020-01-16 DIAGNOSIS — Z6841 Body Mass Index (BMI) 40.0 and over, adult: Secondary | ICD-10-CM

## 2020-01-16 DIAGNOSIS — F419 Anxiety disorder, unspecified: Secondary | ICD-10-CM | POA: Diagnosis present

## 2020-01-16 DIAGNOSIS — N289 Disorder of kidney and ureter, unspecified: Secondary | ICD-10-CM

## 2020-01-16 DIAGNOSIS — F329 Major depressive disorder, single episode, unspecified: Secondary | ICD-10-CM | POA: Diagnosis present

## 2020-01-16 DIAGNOSIS — Z8616 Personal history of COVID-19: Secondary | ICD-10-CM | POA: Diagnosis not present

## 2020-01-16 DIAGNOSIS — J449 Chronic obstructive pulmonary disease, unspecified: Secondary | ICD-10-CM | POA: Diagnosis present

## 2020-01-16 DIAGNOSIS — Z8349 Family history of other endocrine, nutritional and metabolic diseases: Secondary | ICD-10-CM

## 2020-01-16 DIAGNOSIS — M24542 Contracture, left hand: Secondary | ICD-10-CM | POA: Diagnosis present

## 2020-01-16 DIAGNOSIS — L89322 Pressure ulcer of left buttock, stage 2: Secondary | ICD-10-CM | POA: Diagnosis present

## 2020-01-16 DIAGNOSIS — D849 Immunodeficiency, unspecified: Secondary | ICD-10-CM | POA: Diagnosis present

## 2020-01-16 DIAGNOSIS — Z79899 Other long term (current) drug therapy: Secondary | ICD-10-CM

## 2020-01-16 DIAGNOSIS — K625 Hemorrhage of anus and rectum: Secondary | ICD-10-CM | POA: Diagnosis not present

## 2020-01-16 DIAGNOSIS — Z515 Encounter for palliative care: Secondary | ICD-10-CM

## 2020-01-16 LAB — COMPREHENSIVE METABOLIC PANEL
ALT: 11 U/L (ref 0–44)
AST: 15 U/L (ref 15–41)
Albumin: 2.6 g/dL — ABNORMAL LOW (ref 3.5–5.0)
Alkaline Phosphatase: 66 U/L (ref 38–126)
Anion gap: 15 (ref 5–15)
BUN: 98 mg/dL — ABNORMAL HIGH (ref 8–23)
CO2: 26 mmol/L (ref 22–32)
Calcium: 8 mg/dL — ABNORMAL LOW (ref 8.9–10.3)
Chloride: 91 mmol/L — ABNORMAL LOW (ref 98–111)
Creatinine, Ser: 2.94 mg/dL — ABNORMAL HIGH (ref 0.44–1.00)
GFR calc Af Amer: 17 mL/min — ABNORMAL LOW (ref >60)
GFR calc non Af Amer: 15 mL/min — ABNORMAL LOW (ref >60)
Glucose, Bld: 110 mg/dL — ABNORMAL HIGH (ref 70–99)
Potassium: 5.9 mmol/L — ABNORMAL HIGH (ref 3.5–5.1)
Sodium: 132 mmol/L — ABNORMAL LOW (ref 135–145)
Total Bilirubin: 0.7 mg/dL (ref 0.3–1.2)
Total Protein: 5.4 g/dL — ABNORMAL LOW (ref 6.5–8.1)

## 2020-01-16 LAB — URINALYSIS, ROUTINE W REFLEX MICROSCOPIC
Glucose, UA: NEGATIVE mg/dL
Hgb urine dipstick: NEGATIVE
Ketones, ur: NEGATIVE mg/dL
Nitrite: NEGATIVE
Protein, ur: 30 mg/dL — AB
Specific Gravity, Urine: 1.018 (ref 1.005–1.030)
pH: 5 (ref 5.0–8.0)

## 2020-01-16 LAB — CBC WITH DIFFERENTIAL/PLATELET
Abs Immature Granulocytes: 0 10*3/uL (ref 0.00–0.07)
Basophils Absolute: 0 10*3/uL (ref 0.0–0.1)
Basophils Relative: 0 %
Eosinophils Absolute: 0 10*3/uL (ref 0.0–0.5)
Eosinophils Relative: 0 %
HCT: 26.1 % — ABNORMAL LOW (ref 36.0–46.0)
Hemoglobin: 7.8 g/dL — ABNORMAL LOW (ref 12.0–15.0)
Lymphocytes Relative: 0 %
Lymphs Abs: 0 10*3/uL — ABNORMAL LOW (ref 0.7–4.0)
MCH: 30.6 pg (ref 26.0–34.0)
MCHC: 29.9 g/dL — ABNORMAL LOW (ref 30.0–36.0)
MCV: 102.4 fL — ABNORMAL HIGH (ref 80.0–100.0)
Monocytes Absolute: 0.6 10*3/uL (ref 0.1–1.0)
Monocytes Relative: 2 %
Neutro Abs: 29.2 10*3/uL — ABNORMAL HIGH (ref 1.7–7.7)
Neutrophils Relative %: 98 %
Platelets: 214 10*3/uL (ref 150–400)
RBC: 2.55 MIL/uL — ABNORMAL LOW (ref 3.87–5.11)
RDW: 18.6 % — ABNORMAL HIGH (ref 11.5–15.5)
WBC: 29.8 10*3/uL — ABNORMAL HIGH (ref 4.0–10.5)
nRBC: 0 /100 WBC
nRBC: 0.4 % — ABNORMAL HIGH (ref 0.0–0.2)

## 2020-01-16 LAB — LIPASE, BLOOD: Lipase: 17 U/L (ref 11–51)

## 2020-01-16 MED ORDER — SODIUM CHLORIDE 0.9 % IV SOLN
2.0000 g | Freq: Once | INTRAVENOUS | Status: AC
  Administered 2020-01-16: 2 g via INTRAVENOUS
  Filled 2020-01-16: qty 2

## 2020-01-16 MED ORDER — SODIUM CHLORIDE 0.9 % IV BOLUS: 500.0000 mL | Freq: Once | INTRAVENOUS | Status: DC

## 2020-01-16 MED ORDER — METRONIDAZOLE IN NACL 5-0.79 MG/ML-% IV SOLN
500.0000 mg | Freq: Once | INTRAVENOUS | Status: AC
  Administered 2020-01-16: 22:00:00 500 mg via INTRAVENOUS
  Filled 2020-01-16: qty 100

## 2020-01-16 NOTE — ED Provider Notes (Addendum)
[facility] EMERGENCY DEPARTMENT Provider Note   CSN: [id number] Arrival date & time: 01/16/20  1513     History Chief Complaint  Patient presents with  . Shortness of Breath  . Emesis    Sara Pierce is a 79 y.o. female.  HPI Patient presents to the emergency department with abdominal pain that started last night.  The patient is in a nursing facility and I guess this morning they felt that she was not able to help complete her ADLs and they felt that she was in cardiac arrest so they started CPR on her.  The patient is normally on 3 L of oxygen chronically.  Patient states she vomited last night and she has been more short of breath since that time.  Patient is very immobile chronically.  The patient denies chest pain, headache,blurred vision, neck pain, fever, cough, weakness, numbness, dizziness, anorexia, edema,  diarrhea, rash, back pain, dysuria, hematemesis, bloody stool, near syncope, or syncope.    Past Medical History:  Diagnosis Date  . Arthritis   . Cardiomyopathy ([place])   . Cataract   . Cerebral hemorrhage ([place])   . CHF (congestive heart failure) ([place])   . Chronic kidney disease   . Clotting disorder ([place])   . COPD (chronic obstructive pulmonary disease) ([place])   . Depression   . Diabetes mellitus without complication ([place])   . Hypertension   . Mixed incontinence   . Obesity   . Sleep apnea   . Thyroid disease   . Urinary frequency   . Vaginal atrophy   . Varicose veins     Patient Active Problem List   Diagnosis Date Noted  . Perirectal abscess 06/02/2018  . Chronic diastolic heart failure ([place]) 05/12/2018  . NSTEMI (non-ST elevated myocardial infarction) ([place]) 04/15/2018  . COPD (chronic obstructive pulmonary disease) ([place]) 01/04/2018  . Pressure injury of skin 10/25/2017  . Absolute anemia 03/15/2015  . Anxiety 03/15/2015  . Arthritis 03/15/2015  . Cataract 03/15/2015  . Chronic constipation 03/15/2015  . Blood clotting disorder  ([place]) 03/15/2015  . CAFL (chronic airflow limitation) ([place]) 03/15/2015  . Clinical depression 03/15/2015  . Diabetes ([place]) 03/15/2015  . H/O varicella 03/15/2015  . BP (high blood pressure) 03/15/2015  . Mixed incontinence 03/15/2015  . 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 Adiposity 03/15/2015  . Apnea, sleep 03/15/2015  . Disease of thyroid gland 03/15/2015  . FOM (frequency of micturition) 03/15/2015  . 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 Candida vaginitis 03/15/2015  . Abnormal ECG 03/15/2015    Past Surgical History:  Procedure Laterality Date  . APPENDECTOMY    . Cardiac Catherization    . COLONOSCOPY WITH PROPOFOL N/A 04/28/2016   Procedure: COLONOSCOPY WITH PROPOFOL;  Surgeon: Manya Silvas, MD;  Location: [facility] ENDOSCOPY;  Service: Endoscopy;  Laterality: N/A;  . COLONOSCOPY WITH PROPOFOL N/A 04/29/2016   Procedure: COLONOSCOPY WITH PROPOFOL;  Surgeon: Manya Silvas, MD;  Location: [facility] ENDOSCOPY;  Service: Endoscopy;  Laterality: N/A;  . JOINT REPLACEMENT    . STRABISMUS SURGERY    . TONSILLECTOMY    . TUBAL LIGATION       OB History   No obstetric history on file.     Family History  Problem Relation Age of Onset  . Heart attack Father   . Coronary artery disease Father   . Hypertension Father   . Hyperlipidemia Father   . Heart attack Mother   .  Hypertension Mother   . Hyperlipidemia Mother   . Breast cancer Neg Hx     Social History   Tobacco Use  . Smoking status: Former Smoker    Packs/day: 0.50    Years: 44.00    Pack years: 22.00    Types: Cigarettes  . Smokeless tobacco: Never Used  Substance Use Topics  . Alcohol use: No  . Drug use: No    Home Medications Prior to Admission medications   Medication Sig Start Date End Date Taking? Authorizing Provider  acetaminophen (TYLENOL) 325 MG tablet Take 2 tablets (650 mg total) by mouth every 6 (six) hours as needed for mild pain (or Fever >/= 101).  04/22/18   Gouru, Illene Silver, MD  ARIPiprazole (ABILIFY) 5 MG tablet Take 5 mg by mouth at bedtime.    [provider]  azithromycin (ZITHROMAX) 250 MG tablet Take 1 tablet (250 mg total) by mouth daily. Take 1 tablet 5 days per week (Monday thru Friday). Patient not taking: Reported on 09/16/2018 08/30/18   Laverle Hobby, MD  budesonide (PULMICORT) 0.5 MG/2ML nebulizer solution Take 2 mLs (0.5 mg total) 2 (two) times daily by nebulization. 10/28/17   Demetrios Loll, MD  buPROPion (WELLBUTRIN XL) 150 MG 24 hr tablet Take 150 mg by mouth daily.    [provider]  clonazePAM (KLONOPIN) 1 MG tablet Take 1 tablet (1 mg total) by mouth 2 (two) times daily. Patient not taking: Reported on 09/16/2018 04/22/18   Nicholes Mango, MD  clopidogrel (PLAVIX) 75 MG tablet Take 1 tablet (75 mg total) by mouth daily. Patient not taking: Reported on 09/16/2018 04/23/18   Nicholes Mango, MD  cyclobenzaprine (FLEXERIL) 10 MG tablet Take 10 mg by mouth 3 (three) times daily as needed for muscle spasms.    [provider]  FLUoxetine (PROZAC) 40 MG capsule Take 80 mg by mouth daily.    [provider]  fluticasone (FLONASE) 50 MCG/ACT nasal spray Place 2 sprays into both nostrils daily.    [provider]  furosemide (LASIX) 20 MG tablet Take 1 tablet (20 mg total) by mouth 2 (two) times daily. Patient taking differently: Take 40 mg by mouth 2 (two) times daily.  04/22/18   Nicholes Mango, MD  gabapentin (NEURONTIN) 100 MG capsule Take 2 capsules (200 mg total) by mouth 3 (three) times daily. Patient taking differently: Take 300 mg by mouth 3 (three) times daily.  04/22/18   Nicholes Mango, MD  guaiFENesin (MUCINEX) 600 MG 12 hr tablet Take 1 tablet (600 mg total) by mouth 2 (two) times daily. Patient not taking: Reported on 09/16/2018 04/22/18   Nicholes Mango, MD  guaiFENesin (MUCINEX) 600 MG 12 hr tablet Take 1 tablet (600 mg total) by mouth 2 (two) times daily. Patient not taking: Reported  on 09/16/2018 07/27/18   Laverle Hobby, MD  hydrOXYzine (ATARAX/VISTARIL) 25 MG tablet Take 25 mg by mouth 3 (three) times daily as needed.    [provider]  ipratropium-albuterol (DUONEB) 0.5-2.5 (3) MG/3ML SOLN Take 3 mLs by nebulization 4 (four) times daily. Dx:J44.9 07/27/18   Laverle Hobby, MD  magnesium oxide (MAG-OX) 400 MG tablet Take 400 mg by mouth daily.    [provider]  mirtazapine (REMERON) 15 MG tablet Take 7.5 mg by mouth at bedtime.    [provider]  montelukast (SINGULAIR) 10 MG tablet Take 10 mg by mouth at bedtime.     [provider]  omeprazole (PRILOSEC) 20 MG capsule Take 20 mg  by mouth daily as needed.     [provider]  polyethylene glycol (MIRALAX / GLYCOLAX) packet Take 17 g by mouth daily.    [provider]  potassium chloride (K-DUR) 10 MEQ tablet Take 10 mEq by mouth daily.     [provider]  primidone (MYSOLINE) 50 MG tablet Take 50 mg by mouth at bedtime.     [provider]  simvastatin (ZOCOR) 40 MG tablet Take 40 mg by mouth at bedtime.     [provider]  umeclidinium-vilanterol (ANORO ELLIPTA) 62.5-25 MCG/INH AEPB Inhale 1 puff into the lungs daily.    [provider]    Allergies    Aspirin, Codeine, Naprosyn [naproxen], Other, Tape, Valacyclovir, and Latex  Review of Systems   Review of Systems All other systems negative except as documented in the HPI. All pertinent positives and negatives as reviewed in the HPI. Physical Exam Updated Vital Signs BP 125/89 (BP Location: Right Arm)   Pulse 74   Temp (!) 97.4 F (36.3 C) (Oral)   Resp 19   SpO2 99%   Physical Exam Vitals and nursing note reviewed.  Constitutional:      General: She is not in acute distress.    Appearance: She is well-developed.  HENT:     Head: Normocephalic and atraumatic.  Eyes:     Pupils: Pupils are equal, round, and reactive to light.  Cardiovascular:      Rate and Rhythm: Normal rate and regular rhythm.     Heart sounds: Normal heart sounds. No murmur. No friction rub. No gallop.   Pulmonary:     Effort: Pulmonary effort is normal. No respiratory distress.     Breath sounds: Rhonchi present. No wheezing.  Abdominal:     General: Bowel sounds are normal. There is no distension.     Palpations: Abdomen is soft.     Tenderness: There is abdominal tenderness. There is no guarding or rebound.  Musculoskeletal:     Cervical back: Normal range of motion and neck supple.  Skin:    General: Skin is warm and dry.     Capillary Refill: Capillary refill takes less than 2 seconds.     Findings: No erythema or rash.  Neurological:     Mental Status: She is alert and oriented to person, place, and time.     Motor: No abnormal muscle tone.     Coordination: Coordination normal.  Psychiatric:        Behavior: Behavior normal.     ED Results / Procedures / Treatments   Labs (all labs ordered are listed, but only abnormal results are displayed) Labs Reviewed  COMPREHENSIVE METABOLIC PANEL  CBC WITH DIFFERENTIAL/PLATELET  URINALYSIS, ROUTINE W REFLEX MICROSCOPIC  LIPASE, BLOOD    EKG None  Radiology No results found.  Procedures Procedures (including critical care time)  Medications Ordered in ED Medications - No data to display  ED Course  I have reviewed the triage vital signs and the nursing notes.  Pertinent labs & imaging results that were available during my care of the patient were reviewed by me and considered in my medical decision making (see chart for details).    MDM Rules/Calculators/A&P                      Patient's oxygen is reduced back down to her normal amount.  While she was off the oxygen though she maintained 100% oxygen saturation.  Patient's abdominal pain  and vomiting and feeling need to be examined further at this point.  She does not have any upper respiratory symptoms although her lungs do sound like the  have some abnormal breath sounds but unclear what is chronic versus acute.  Patient is chronically on oxygen at the facility.  I feel that at this point we need to further evaluate her abdominal discomfort and nausea and vomiting.  CT scan and laboratory testing have been ordered as well as a chest x-ray. Final Clinical Impression(s) / ED Diagnoses Final diagnoses:  None    Rx / DC Orders ED Discharge Orders    None       Dalia Heading, PA-C 01/16/20 1549    Fernado Brigante, [place], PA-C 01/16/20 1550    Sherwood Gambler, MD 01/17/20 [phone]

## 2020-01-16 NOTE — H&P (Signed)
History and Physical    Sara Pierce ZDG:[id number] DOB: Mar 16, 1941 DOA: 01/16/2020  PCP: Marygrace Drought, MD  Patient coming from: [facility] skilled nursing facility  I have personally briefly reviewed patient's old medical records in [place]  Chief Complaint: Vomiting, abdominal pain  HPI: Sara Pierce is a 79 y.o. female with medical history significant for COPD on 3 L home oxygen and chronic steroids, CHF, CAD, atrial fibrillation, hyperlipidemia, type 2 diabetes and hypothyroidism who presents from [place] having skilled nursing facility via EMS today. Her history is difficult to fully reconcile, given differences in report from EMS that are noted in the chart and the patient's and history, which seems intact.  Unfortunately family not able to provide collateral information.  She has been living at her current skilled nursing facility for the better part of 2 years, with brief interruptions for hospitalizations, including most recently on 12/12/2019 when she was hospitalized for Covid pneumonia.  She was discharged back to the facility on 12/25/2019, and has reportedly been treated for pneumonia and volume overload since then. She reports she was in her usual state of health until approximately 3 days ago when she began having abdominal pain associated with intermittent loose stools as well as one episode of vomiting which occurred last night after drinking prune juice.  Over the course of the day she has had persistent nausea, vomiting and abdominal pain.  She denies any fevers, chills, chest pain or increasing dyspnea, though notes she does feel slightly congested, which appears to be potentially her baseline.  She denies melena, hematochezia, hematuria, dysuria, hematemesis or other blood loss.  A complicating factor in her history is that reportedly she was performing her ADLs at the facility today when there was acutely concerned for cardiac arrest with CPR reportedly initiated,  though aborted by the patient herself.  The patient is unable to confirm specific details of this, and has no physical exam findings consistent with attempted cardiopulmonary resuscitation.  Of note, patient met with palliative care on 01/08/2020 who initiated goals of care discussions, as the patient has been considering going home with home hospice.  Discussed her goals of care at the time of admission, patient states she would like to proceed with care at this time, though would like to update her CODE STATUS to DNR/limited and potentially go home with home hospice pending further discussions and progression of her symptoms/clinical status overall.   ED Course: On arrival to the ED pt was hypothermic at 97.4, intermittently bradycardic from 51-74, respiratory rate 15, blood pressure 125/89 and saturating well initially on 8 L nonrebreather, though subsequently weaned to slightly above her home oxygen requirement.  Labs and imaging studies acquired. Initial labwork notable for leukocytosis 29.8, hemoglobin 7.8 with an MCV of 102.4,, potassium 5.9, creatinine 2.94.  No recent baselines to compare to, though creatinine on October 2019 noted to be 0.7.  Hemoglobin at the same time noted to be 11.4.  Lipase 17. UA with squamous epithelial cells, few bacteria, moderate leukocytes. Imaging studies notable for chest x-ray with left lung base showing atelectasis/infiltrate with a small left pleural effusion potentially findings of cardiomegaly. 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  Patient was administered cefepime and Flagyl. Given concern for colitis, decision was made to admit.   Review of Systems: As per  HPI otherwise 10 point review of systems negative.   Past Medical History:   Diagnosis Date  . Arthritis   . Cardiomyopathy ([place])   . Cataract   . Cerebral hemorrhage ([place])   . CHF (congestive heart failure) ([place])   . Chronic kidney disease   . Clotting disorder ([place])   . COPD (chronic obstructive pulmonary disease) ([place])   . Depression   . Diabetes mellitus without complication ([place])   . Hypertension   . Mixed incontinence   . Obesity   . Sleep apnea   . Thyroid disease   . Urinary frequency   . Vaginal atrophy   . Varicose veins     Past Surgical History:  Procedure Laterality Date  . APPENDECTOMY    . Cardiac Catherization    . COLONOSCOPY WITH PROPOFOL N/A 04/28/2016   Procedure: COLONOSCOPY WITH PROPOFOL;  Surgeon: Manya Silvas, MD;  Location[facility] ENDOSCOPY;  Service: Endoscopy;  Laterality: N/A;  . COLONOSCOPY WITH PROPOFOL N/A 04/29/2016   Procedure: COLONOSCOPY WITH PROPOFOL;  Surgeon: Manya Silvas, MD;  Location: [facility] ENDOSCOPY;  Service: Endoscopy;  Laterality: N/A;  . JOINT REPLACEMENT    . STRABISMUS SURGERY    . TONSILLECTOMY    . TUBAL LIGATION       reports that she has quit smoking. Her smoking use included cigarettes. She has a 22.00 pack-year smoking history. She has never used smokeless tobacco. She reports that she does not drink alcohol or use drugs.  Allergies  Allergen Reactions  . Aspirin Swelling  . Codeine Itching  . Naprosyn [Naproxen] Other (See Comments)    Per MAR pt allergic  . Other     Elastic Bandages/ Supports Per MAR  . Tape Hives  . Valacyclovir   . Latex Rash    Family History  Problem Relation Age of Onset  . Heart attack Father   . Coronary artery disease Father   . Hypertension Father   . Hyperlipidemia Father   . Heart attack Mother   . Hypertension Mother   . Hyperlipidemia Mother   . Breast cancer Neg Hx     Prior to Admission medications   Medication Sig Start Date End Date Taking? Authorizing Provider  acetaminophen (TYLENOL) 325 MG tablet Take 2 tablets (650 mg total) by  mouth every 6 (six) hours as needed for mild pain (or Fever >/= 101). 04/22/18   Gouru, Illene Silver, MD  ARIPiprazole (ABILIFY) 5 MG tablet Take 5 mg by mouth at bedtime.    [provider]  azithromycin (ZITHROMAX) 250 MG tablet Take 1 tablet (250 mg total) by mouth daily. Take 1 tablet 5 days per week (Monday thru Friday). Patient not taking: Reported on 09/16/2018 08/30/18   Laverle Hobby, MD  budesonide (PULMICORT) 0.5 MG/2ML nebulizer solution Take 2 mLs (0.5 mg total) 2 (two) times daily by nebulization. 10/28/17   Demetrios Loll, MD  buPROPion (WELLBUTRIN XL) 150 MG 24 hr tablet Take 150 mg by mouth daily.    [provider]  clonazePAM (KLONOPIN) 1 MG tablet Take 1 tablet (1 mg total) by mouth 2 (two) times daily. Patient not taking: Reported on 09/16/2018 04/22/18   Nicholes Mango, MD  clopidogrel (PLAVIX) 75 MG tablet Take 1 tablet (75 mg total) by mouth daily. Patient not taking: Reported on 09/16/2018 04/23/18   Nicholes Mango, MD  cyclobenzaprine (FLEXERIL) 10 MG tablet Take 10 mg by mouth 3 (three) times daily as needed for muscle spasms.    [provider]  FLUoxetine (PROZAC) 40 MG capsule Take 80 mg by mouth daily.    [provider]  fluticasone (FLONASE) 50 MCG/ACT nasal spray Place 2 sprays into both nostrils daily.    [provider]  furosemide (LASIX) 20 MG tablet Take 1 tablet (20 mg total) by mouth 2 (two) times daily. Patient taking differently: Take 40 mg by mouth 2 (two) times daily.  04/22/18   Nicholes Mango, MD  gabapentin (NEURONTIN) 100 MG capsule Take 2 capsules (200 mg total) by mouth 3 (three) times daily. Patient taking differently: Take 300 mg by mouth 3 (three) times daily.  04/22/18   Nicholes Mango, MD  guaiFENesin (MUCINEX) 600 MG 12 hr tablet Take 1 tablet (600 mg total) by mouth 2 (two) times daily. Patient not taking: Reported on 09/16/2018 04/22/18   Nicholes Mango, MD  guaiFENesin (MUCINEX) 600 MG 12 hr tablet Take 1 tablet (600  mg total) by mouth 2 (two) times daily. Patient not taking: Reported on 09/16/2018 07/27/18   Laverle Hobby, MD  hydrOXYzine (ATARAX/VISTARIL) 25 MG tablet Take 25 mg by mouth 3 (three) times daily as needed.    [provider]  ipratropium-albuterol (DUONEB) 0.5-2.5 (3) MG/3ML SOLN Take 3 mLs by nebulization 4 (four) times daily. Dx:J44.9 07/27/18   Laverle Hobby, MD  magnesium oxide (MAG-OX) 400 MG tablet Take 400 mg by mouth daily.    [provider]  mirtazapine (REMERON) 15 MG tablet Take 7.5 mg by mouth at bedtime.    [provider]  montelukast (SINGULAIR) 10 MG tablet Take 10 mg by mouth at bedtime.     [provider]  omeprazole (PRILOSEC) 20 MG capsule Take 20 mg by mouth daily as needed.     [provider]  polyethylene glycol (MIRALAX / GLYCOLAX) packet Take 17 g by mouth daily.    [provider]  potassium chloride (K-DUR) 10 MEQ tablet Take 10 mEq by mouth daily.     [provider]  primidone (MYSOLINE) 50 MG tablet Take 50 mg by mouth at bedtime.     [provider]  simvastatin (ZOCOR) 40 MG tablet Take 40 mg by mouth at bedtime.     [provider]  umeclidinium-vilanterol (ANORO ELLIPTA) 62.5-25 MCG/INH AEPB Inhale 1 puff into the lungs daily.    [provider]    Physical Exam: Vitals:   01/16/20 1815 01/16/20 1830 01/16/20 1845 01/16/20 1900  BP:      Pulse: (!) 51 (!) 51 (!) 52 (!) 55  Resp: '15 15 14 14  '$ Temp:      TempSrc:      SpO2: 99% 99% 99% 99%   Constitutional: NAD, calm, appears chronically ill Eyes: PERRL, lids and conjunctivae normal ENMT: Mucous membranes are moist. Posterior pharynx clear of any exudate or lesions. Neck: normal, supple, no masses, no thyromegaly Respiratory: Left lower rib mild rhonchi, upper airway congestion sounds, no wheezing, faint bibasilar crackles.  Saturating well on 6 L of oxygen via nasal cannula.  Normal respiratory  effort. No accessory muscle use.  Cardiovascular: Regular rate and rhythm, no murmurs / rubs / gallops. No extremity edema. 2+ pedal pulses. No carotid bruits.   Abdomen: Diffuse mild to moderate tenderness, no masses palpated. No hepatosplenomegaly. Bowel sounds positive.  Musculoskeletal: no clubbing / cyanosis. No joint deformity upper and lower extremities. Good ROM, no contractures. Normal muscle tone.  Skin: no rashes, lesions, ulcers. No induration Neurologic: CN 2-12 grossly intact. Sensation intact. Strength  5/5 in all 4.  Psychiatric: Normal judgment and insight. Alert and oriented x 3. Normal mood.   Labs on Admission: I have personally reviewed following labs and imaging studies  CBC: Recent Labs  Lab 01/16/20 1540  WBC 29.8*  NEUTROABS 29.2*  HGB 7.8*  HCT 26.1*  MCV 102.4*  PLT 833   Basic Metabolic Panel: Recent Labs  Lab 01/16/20 1540  NA 132*  K 5.9*  CL 91*  CO2 26  GLUCOSE 110*  BUN 98*  CREATININE 2.94*  CALCIUM 8.0*   GFR: CrCl cannot be calculated (Unknown ideal weight.). Liver Function Tests: Recent Labs  Lab 01/16/20 1540  AST 15  ALT 11  ALKPHOS 66  BILITOT 0.7  PROT 5.4*  ALBUMIN 2.6*   Recent Labs  Lab 01/16/20 1540  LIPASE 17   No results for input(s): AMMONIA in the last 168 hours. Coagulation Profile: No results for input(s): INR, PROTIME in the last 168 hours. Cardiac Enzymes: No results for input(s): CKTOTAL, CKMB, CKMBINDEX, TROPONINI in the last 168 hours. BNP (last 3 results) No results for input(s): PROBNP in the last 8760 hours. HbA1C: No results for input(s): HGBA1C in the last 72 hours. CBG: No results for input(s): GLUCAP in the last 168 hours. Lipid Profile: No results for input(s): CHOL, HDL, LDLCALC, TRIG, CHOLHDL, LDLDIRECT in the last 72 hours. Thyroid Function Tests: No results for input(s): TSH, T4TOTAL, FREET4, T3FREE, THYROIDAB in the last 72 hours. Anemia Panel: No results for input(s): VITAMINB12,  FOLATE, FERRITIN, TIBC, IRON, RETICCTPCT in the last 72 hours. Urine analysis:    Component Value Date/Time   COLORURINE AMBER (A) 01/16/2020 1723   APPEARANCEUR CLOUDY (A) 01/16/2020 1723   APPEARANCEUR Clear 05/19/2014 2245   LABSPEC 1.018 01/16/2020 1723   LABSPEC 1.008 05/19/2014 2245   PHURINE 5.0 01/16/2020 1723   GLUCOSEU NEGATIVE 01/16/2020 1723   GLUCOSEU Negative 05/19/2014 2245   HGBUR NEGATIVE 01/16/2020 1723   BILIRUBINUR SMALL (A) 01/16/2020 1723   BILIRUBINUR Negative 05/19/2014 2245   KETONESUR NEGATIVE 01/16/2020 1723   PROTEINUR 30 (A) 01/16/2020 1723   UROBILINOGEN 1.0 03/05/2011 1312   NITRITE NEGATIVE 01/16/2020 1723   LEUKOCYTESUR MODERATE (A) 01/16/2020 1723   LEUKOCYTESUR Negative 05/19/2014 2245    Radiological Exams on Admission: CT ABDOMEN PELVIS WO CONTRAST  Result Date: 01/16/2020 CLINICAL DATA:  Nausea and vomiting. EXAM: CT ABDOMEN AND PELVIS WITHOUT CONTRAST TECHNIQUE: Multidetector CT imaging of the abdomen and pelvis was performed following the standard protocol without IV contrast. COMPARISON:  March 01, 2018 FINDINGS: Lower chest: Moderate-severity atelectatic changes are seen within the bilateral lung bases. Hepatobiliary: No focal liver abnormality is seen. No gallstones, gallbladder wall thickening, or biliary dilatation. Pancreas: Unremarkable. No pancreatic ductal dilatation or surrounding inflammatory changes. Spleen: Normal in size without focal abnormality. Adrenals/Urinary Tract: There is mild diffuse enlargement of the right adrenal gland. 1.4 cm x 1.2 cm and 1.3 cm x 1.1 cm low-attenuation left adrenal masses are noted. Kidneys are normal, without renal calculi, focal lesion, or hydronephrosis. Bladder is unremarkable. Stomach/Bowel: There is a small hiatal hernia. The appendix is not identified. No evidence of bowel dilatation. There is mild diffuse thickening of the mid and distal sigmoid colon with a mild amount of pericolonic inflammatory  fat stranding. Vascular/Lymphatic: Marked severity aortic atherosclerosis. No enlarged abdominal or pelvic lymph nodes. Reproductive: Ill-defined coarse appearing uterine calcifications are seen. Other: No abdominal wall hernia or abnormality. No abdominopelvic ascites. Musculoskeletal: Multilevel degenerative changes seen throughout the lumbar spine.  IMPRESSION: 1. Mild to moderate severity colitis involving the mid and distal sigmoid colon. 2. Small hiatal hernia. 3. 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 4. Marked severity aortic atherosclerosis. Aortic Atherosclerosis (ICD10-I70.0). Electronically Signed   By: Virgina Norfolk M.D.   On: 01/16/2020 21:03   DG Chest 2 View  Result Date: 01/16/2020 CLINICAL DATA:  79 year old female with concern for aspiration. EXAM: CHEST - 2 VIEW COMPARISON:  Chest radiograph dated 09/16/2018 and 04/21/2018. FINDINGS: Stable cardiomegaly. There is prominence of the hilar vasculature, likely representing a degree of pulmonary hypertension. Left lung base density may represent atelectasis or infiltrate. Small layering pleural effusion may be present on the lateral view. No pneumothorax. Atherosclerotic calcification of the aorta. No acute osseous pathology. Partially visualized right shoulder arthroplasty. IMPRESSION: 1. Left lung base atelectasis or infiltrate. A small left pleural effusion may be present. 2. Cardiomegaly with findings suggestive of pulmonary hypertension. Electronically Signed   By: Anner Crete M.D.   On: 01/16/2020 16:13    Assessment/Plan Active Problems:   Colitis  Colitis Leukocytosis -Currently hemodynamically stable, though hypothermic and bradycardic, with impressive leukocytosis and evidence of colitis on CT scan with associated abdominal pain, nausea and vomiting as well as occasional loose stools in the preceding days -DDx includes infectious colitis, particularly C. difficile versus other  gastrointestinal organism, ischemic colitis on differential although appears less likely at this time given lack of bloody stool and only mild to moderate abdominal pain. Plan: -Obtain lactate, will continue antibiotics for now -Obtain blood cultures -Obtain TSH given bradycardia and hypothermia.  Of note patient has hypothyroidism on her problem list obtained from skilled nursing facility, though levothyroxine replacement is not on her medication list obtained from facility -Obtain stool sample to send for C. difficile and GIP, stop antibiotics and transition to appropriate therapy pending results  AKI Hyperkalemia -Likely secondary to prerenal in the setting of vomiting/loose stools and infection, differential includes ATN.  Urinalysis difficult to interpret given squamous epithelial cells, though mild hyaline casts and crystals noted.   Plan -obtain urine studies to evaluate further -Patient's overall fluid status is challenging, given slight pleural effusion though total body mildly volume depleted, will proceed with cautious IVF in the setting of her known CHF.   -Holding her home torsemide 20 mg as well as her home Aldactone 12.5 mg daily daily at this time. -Obtain mag/Phos -Trend potassium, obtain EKG in the setting of hyperkalemia to evaluate for potential EKG changes and temporize if positive  Acute on chronic hypoxic respiratory failure COPD CHF Aspiration pneumonitis -Presents saturating well on 4 to 6 L of oxygen which is above her chronic requirement of 3 L of oxygen via nasal cannula at home.  Likely etiology is in the setting of aspiration pneumonitis in setting of vomiting with findings compatible with this on her chest x-ray. Plan: -Continue home Anoro Ellipta, Singulair, as well as her 10 mg of chronic prednisone which she is administered for severe COPD reportedly -Wean oxygen as tolerated with symptomatic improvement, continue incentive spirometer for  atelectasis/aspiration as well as treating any symptoms of nausea she may have. -For her CHF, we are continuing her home Coreg, though holding her home torsemide 20 mg daily as well as Aldactone 12.5 mg daily in the setting of likely hypovolemic AKI with hyperkalemia. -Pending fluid status and metabolic derangement correction, add on as tolerated.  Close attention to patient's fluid status at this time.  Macrocytic anemia -Hemoglobin 7.8, previous 1 year ago 11.5,  baseline appears around 11.  No active bleeding. -Obtain B12, folate, iron profile, type and screen  Chronic depression -Continue home Prozac, Wellbutrin  Paroxysmal atrial fibrillation Coronary artery disease -Continue home Eliquis, Coreg, Plavix.  Patient has aspirin allergy.  Goals of Care -Of note, as mentioned in HPI patient recently met with palliative care representative and has expressed some initial interest in pursuing home hospice, though there is some reluctance and a final decision regarding this at this time.  For now, patient wishes to be admitted with routine medical care as indicated, though updating her CODE STATUS to DNR/limited.  Patient expresses potential desire to go home with home hospice pending her clinical course. Plan: -Consult palliative care in a.m. Attempted to reach husband to discuss this further, though unfortunately was not able to reach him.   DVT prophylaxis: Eliquis Code Status: DNR, see above for additional details of goals of care discussions that are ongoing  Family Communication: I called the patient's husband at his updated number of [phone] (listed in papers from the patient's skilled nursing facility) and instead her son Louie Casa answered, and said her husband was sleeping.  I requested a call back from her husband whenever he was able to speak further.  Additionally, patient's other listed emergency contact son Kyung Rudd, without success. Disposition Plan: Possible discharge home with  home hospice versus skilled nursing facility pending goals of care discussions Consults called: None Admission status: Inpatient telemetry   S. Carolyne Fiscal, DO [facility] Pager [phone]  If 7PM-7AM, please contact night-coverage2 [email or web address] Password [facility]  01/16/2020, 9:52 PM

## 2020-01-16 NOTE — ED Provider Notes (Signed)
Patient received in sign out from C. Lawyer, PA-C. Patient seen today for evaluation of abdominal pain onset last night with episode of vomiting. She has COPD and is normally on 3 LPM via . She reported her shortness of breath worsened after the episode of vomiting. She is awaiting completion of an abdominal CT.  Results for orders placed or performed during the hospital encounter of 01/16/20  Comprehensive metabolic panel  Result Value Ref Range   Sodium 132 (L) 135 - 145 mmol/L   Potassium 5.9 (H) 3.5 - 5.1 mmol/L   Chloride 91 (L) 98 - 111 mmol/L   CO2 26 22 - 32 mmol/L   Glucose, Bld 110 (H) 70 - 99 mg/dL   BUN 98 (H) 8 - 23 mg/dL   Creatinine, Ser 2.94 (H) 0.44 - 1.00 mg/dL   Calcium 8.0 (L) 8.9 - 10.3 mg/dL   Total Protein 5.4 (L) 6.5 - 8.1 g/dL   Albumin 2.6 (L) 3.5 - 5.0 g/dL   AST 15 15 - 41 U/L   ALT 11 0 - 44 U/L   Alkaline Phosphatase 66 38 - 126 U/L   Total Bilirubin 0.7 0.3 - 1.2 mg/dL   GFR calc non Af Amer 15 (L) >60 mL/min   GFR calc Af Amer 17 (L) >60 mL/min   Anion gap 15 5 - 15  CBC with Differential  Result Value Ref Range   WBC 29.8 (H) 4.0 - 10.5 K/uL   RBC 2.55 (L) 3.87 - 5.11 MIL/uL   Hemoglobin 7.8 (L) 12.0 - 15.0 g/dL   HCT 26.1 (L) 36.0 - 46.0 %   MCV 102.4 (H) 80.0 - 100.0 fL   MCH 30.6 26.0 - 34.0 pg   MCHC 29.9 (L) 30.0 - 36.0 g/dL   RDW 18.6 (H) 11.5 - 15.5 %   Platelets 214 150 - 400 K/uL   nRBC 0.4 (H) 0.0 - 0.2 %   Neutrophils Relative % 98 %   Neutro Abs 29.2 (H) 1.7 - 7.7 K/uL   Lymphocytes Relative 0 %   Lymphs Abs 0.0 (L) 0.7 - 4.0 K/uL   Monocytes Relative 2 %   Monocytes Absolute 0.6 0.1 - 1.0 K/uL   Eosinophils Relative 0 %   Eosinophils Absolute 0.0 0.0 - 0.5 K/uL   Basophils Relative 0 %   Basophils Absolute 0.0 0.0 - 0.1 K/uL   nRBC 0 0 /100 WBC   Abs Immature Granulocytes 0.00 0.00 - 0.07 K/uL   Tear Drop Cells PRESENT    Polychromasia PRESENT   Urinalysis, Routine w reflex microscopic  Result Value Ref Range   Color,  Urine AMBER (A) YELLOW   APPearance CLOUDY (A) CLEAR   Specific Gravity, Urine 1.018 1.005 - 1.030   pH 5.0 5.0 - 8.0   Glucose, UA NEGATIVE NEGATIVE mg/dL   Hgb urine dipstick NEGATIVE NEGATIVE   Bilirubin Urine SMALL (A) NEGATIVE   Ketones, ur NEGATIVE NEGATIVE mg/dL   Protein, ur 30 (A) NEGATIVE mg/dL   Nitrite NEGATIVE NEGATIVE   Leukocytes,Ua MODERATE (A) NEGATIVE   RBC / HPF 0-5 0 - 5 RBC/hpf   WBC, UA 11-20 0 - 5 WBC/hpf   Bacteria, UA FEW (A) NONE SEEN   Squamous Epithelial / LPF 6-10 0 - 5   Mucus PRESENT    Hyaline Casts, UA PRESENT    Amorphous Crystal PRESENT    Non Squamous Epithelial 0-5 (A) NONE SEEN  Lipase, blood  Result Value Ref Range   Lipase 17  11 - 51 U/L   CT ABDOMEN PELVIS WO CONTRAST  Result Date: 01/16/2020 CLINICAL DATA:  Nausea and vomiting. EXAM: CT ABDOMEN AND PELVIS WITHOUT CONTRAST TECHNIQUE: Multidetector CT imaging of the abdomen and pelvis was performed following the standard protocol without IV contrast. COMPARISON:  March 01, 2018 FINDINGS: Lower chest: Moderate-severity atelectatic changes are seen within the bilateral lung bases. Hepatobiliary: No focal liver abnormality is seen. No gallstones, gallbladder wall thickening, or biliary dilatation. Pancreas: Unremarkable. No pancreatic ductal dilatation or surrounding inflammatory changes. Spleen: Normal in size without focal abnormality. Adrenals/Urinary Tract: There is mild diffuse enlargement of the right adrenal gland. 1.4 cm x 1.2 cm and 1.3 cm x 1.1 cm low-attenuation left adrenal masses are noted. Kidneys are normal, without renal calculi, focal lesion, or hydronephrosis. Bladder is unremarkable. Stomach/Bowel: There is a small hiatal hernia. The appendix is not identified. No evidence of bowel dilatation. There is mild diffuse thickening of the mid and distal sigmoid colon with a mild amount of pericolonic inflammatory fat stranding. Vascular/Lymphatic: Marked severity aortic atherosclerosis. No  enlarged abdominal or pelvic lymph nodes. Reproductive: Ill-defined coarse appearing uterine calcifications are seen. Other: No abdominal wall hernia or abnormality. No abdominopelvic ascites. Musculoskeletal: Multilevel degenerative changes seen throughout the lumbar spine. IMPRESSION: 1. Mild to moderate severity colitis involving the mid and distal sigmoid colon. 2. Small hiatal hernia. 3. 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 4. Marked severity aortic atherosclerosis. Aortic Atherosclerosis (ICD10-I70.0). Electronically Signed   By: Virgina Norfolk M.D.   On: 01/16/2020 21:03   DG Chest 2 View  Result Date: 01/16/2020 CLINICAL DATA:  79 year old female with concern for aspiration. EXAM: CHEST - 2 VIEW COMPARISON:  Chest radiograph dated 09/16/2018 and 04/21/2018. FINDINGS: Stable cardiomegaly. There is prominence of the hilar vasculature, likely representing a degree of pulmonary hypertension. Left lung base density may represent atelectasis or infiltrate. Small layering pleural effusion may be present on the lateral view. No pneumothorax. Atherosclerotic calcification of the aorta. No acute osseous pathology. Partially visualized right shoulder arthroplasty. IMPRESSION: 1. Left lung base atelectasis or infiltrate. A small left pleural effusion may be present. 2. Cardiomegaly with findings suggestive of pulmonary hypertension. Electronically Signed   By: Anner Crete M.D.   On: 01/16/2020 16:13    Labs and radiology results reviewed. Ct abdomen reveals colitis. Leukocytosis with acute on chronic renal insufficiency. She continues to report lower abdominal discomfort. Fluids and antibiotics initiated. Will request admission.   Etta Quill, NP 01/16/20 2158    Dorie Rank, MD 01/16/20 [phone]
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
## 2020-01-17 ENCOUNTER — Inpatient Hospital Stay (HOSPITAL_COMMUNITY): Payer: Medicare Other

## 2020-01-17 DIAGNOSIS — I5042 Chronic combined systolic (congestive) and diastolic (congestive) heart failure: Secondary | ICD-10-CM

## 2020-01-17 DIAGNOSIS — J449 Chronic obstructive pulmonary disease, unspecified: Secondary | ICD-10-CM

## 2020-01-17 DIAGNOSIS — I480 Paroxysmal atrial fibrillation: Secondary | ICD-10-CM

## 2020-01-17 DIAGNOSIS — J9621 Acute and chronic respiratory failure with hypoxia: Secondary | ICD-10-CM

## 2020-01-17 DIAGNOSIS — N179 Acute kidney failure, unspecified: Secondary | ICD-10-CM

## 2020-01-17 LAB — CBC WITH DIFFERENTIAL/PLATELET
Abs Immature Granulocytes: 0.2 10*3/uL — ABNORMAL HIGH (ref 0.00–0.07)
Basophils Absolute: 0 10*3/uL (ref 0.0–0.1)
Basophils Relative: 0 %
Eosinophils Absolute: 0.1 10*3/uL (ref 0.0–0.5)
Eosinophils Relative: 1 %
HCT: 25.4 % — ABNORMAL LOW (ref 36.0–46.0)
Hemoglobin: 7.8 g/dL — ABNORMAL LOW (ref 12.0–15.0)
Immature Granulocytes: 1 %
Lymphocytes Relative: 5 %
Lymphs Abs: 1 10*3/uL (ref 0.7–4.0)
MCH: 31.2 pg (ref 26.0–34.0)
MCHC: 30.7 g/dL (ref 30.0–36.0)
MCV: 101.6 fL — ABNORMAL HIGH (ref 80.0–100.0)
Monocytes Absolute: 1 10*3/uL (ref 0.1–1.0)
Monocytes Relative: 5 %
Neutro Abs: 17.7 10*3/uL — ABNORMAL HIGH (ref 1.7–7.7)
Neutrophils Relative %: 88 %
Platelets: 184 10*3/uL (ref 150–400)
RBC: 2.5 MIL/uL — ABNORMAL LOW (ref 3.87–5.11)
RDW: 18.3 % — ABNORMAL HIGH (ref 11.5–15.5)
WBC: 20 10*3/uL — ABNORMAL HIGH (ref 4.0–10.5)
nRBC: 0.2 % (ref 0.0–0.2)

## 2020-01-17 LAB — BASIC METABOLIC PANEL
Anion gap: 9 (ref 5–15)
Anion gap: 12 (ref 5–15)
BUN: 85 mg/dL — ABNORMAL HIGH (ref 8–23)
BUN: 101 mg/dL — ABNORMAL HIGH (ref 8–23)
CO2: 22 mmol/L (ref 22–32)
CO2: 27 mmol/L (ref 22–32)
Calcium: 5.9 mg/dL — CL (ref 8.9–10.3)
Calcium: 7.3 mg/dL — ABNORMAL LOW (ref 8.9–10.3)
Chloride: 102 mmol/L (ref 98–111)
Chloride: 92 mmol/L — ABNORMAL LOW (ref 98–111)
Creatinine, Ser: 2.12 mg/dL — ABNORMAL HIGH (ref 0.44–1.00)
Creatinine, Ser: 2.31 mg/dL — ABNORMAL HIGH (ref 0.44–1.00)
GFR calc Af Amer: 26 mL/min — ABNORMAL LOW (ref >60)
GFR calc Af Amer: 23 mL/min — ABNORMAL LOW (ref >60)
GFR calc non Af Amer: 22 mL/min — ABNORMAL LOW (ref >60)
GFR calc non Af Amer: 20 mL/min — ABNORMAL LOW (ref >60)
Glucose, Bld: 83 mg/dL (ref 70–99)
Glucose, Bld: 114 mg/dL — ABNORMAL HIGH (ref 70–99)
Potassium: 4.5 mmol/L (ref 3.5–5.1)
Potassium: 5.4 mmol/L — ABNORMAL HIGH (ref 3.5–5.1)
Sodium: 133 mmol/L — ABNORMAL LOW (ref 135–145)
Sodium: 131 mmol/L — ABNORMAL LOW (ref 135–145)

## 2020-01-17 LAB — IRON AND TIBC
Iron: 22 ug/dL — ABNORMAL LOW (ref 28–170)
Saturation Ratios: 10 % — ABNORMAL LOW (ref 10.4–31.8)
TIBC: 213 ug/dL — ABNORMAL LOW (ref 250–450)
UIBC: 191 ug/dL

## 2020-01-17 LAB — COMPREHENSIVE METABOLIC PANEL
ALT: 12 U/L (ref 0–44)
AST: 20 U/L (ref 15–41)
Albumin: 2.6 g/dL — ABNORMAL LOW (ref 3.5–5.0)
Alkaline Phosphatase: 71 U/L (ref 38–126)
Anion gap: 12 (ref 5–15)
BUN: 104 mg/dL — ABNORMAL HIGH (ref 8–23)
CO2: 26 mmol/L (ref 22–32)
Calcium: 7.4 mg/dL — ABNORMAL LOW (ref 8.9–10.3)
Chloride: 93 mmol/L — ABNORMAL LOW (ref 98–111)
Creatinine, Ser: 2.63 mg/dL — ABNORMAL HIGH (ref 0.44–1.00)
GFR calc Af Amer: 20 mL/min — ABNORMAL LOW (ref >60)
GFR calc non Af Amer: 17 mL/min — ABNORMAL LOW (ref >60)
Glucose, Bld: 89 mg/dL (ref 70–99)
Potassium: 5.7 mmol/L — ABNORMAL HIGH (ref 3.5–5.1)
Sodium: 131 mmol/L — ABNORMAL LOW (ref 135–145)
Total Bilirubin: 1 mg/dL (ref 0.3–1.2)
Total Protein: 5.8 g/dL — ABNORMAL LOW (ref 6.5–8.1)

## 2020-01-17 LAB — MAGNESIUM: Magnesium: 1.8 mg/dL (ref 1.7–2.4)

## 2020-01-17 LAB — HEMOGLOBIN A1C
Hgb A1c MFr Bld: 5.4 % (ref 4.8–5.6)
Mean Plasma Glucose: 108.28 mg/dL

## 2020-01-17 LAB — NA AND K (SODIUM & POTASSIUM), RAND UR
Potassium Urine: 67 mmol/L
Sodium, Ur: <10 mmol/L

## 2020-01-17 LAB — PROTEIN / CREATININE RATIO, URINE
Creatinine, Urine: 103.56 mg/dL
Protein Creatinine Ratio: 0.6 mg/mg{Cre} — ABNORMAL HIGH (ref 0.00–0.15)
Total Protein, Urine: 62 mg/dL

## 2020-01-17 LAB — C DIFFICILE QUICK SCREEN W PCR REFLEX
C Diff antigen: POSITIVE — AB
C Diff toxin: NEGATIVE

## 2020-01-17 LAB — TSH: TSH: 1.11 u[IU]/mL (ref 0.350–4.500)

## 2020-01-17 LAB — SARS CORONAVIRUS 2 (TAT 6-24 HRS): SARS Coronavirus 2: NEGATIVE

## 2020-01-17 LAB — CLOSTRIDIUM DIFFICILE BY PCR, REFLEXED: Toxigenic C. Difficile by PCR: POSITIVE — AB

## 2020-01-17 LAB — MRSA PCR SCREENING: MRSA by PCR: NEGATIVE

## 2020-01-17 LAB — LACTIC ACID, PLASMA: Lactic Acid, Venous: 0.9 mmol/L (ref 0.5–1.9)

## 2020-01-17 LAB — VITAMIN B12: Vitamin B-12: 363 pg/mL (ref 180–914)

## 2020-01-17 LAB — BRAIN NATRIURETIC PEPTIDE: B Natriuretic Peptide: 259.7 pg/mL — ABNORMAL HIGH (ref 0.0–100.0)

## 2020-01-17 LAB — PHOSPHORUS: Phosphorus: 5.3 mg/dL — ABNORMAL HIGH (ref 2.5–4.6)

## 2020-01-17 LAB — FERRITIN: Ferritin: 384 ng/mL — ABNORMAL HIGH (ref 11–307)

## 2020-01-17 MED ORDER — MONTELUKAST SODIUM 10 MG PO TABS
10.0000 mg | ORAL_TABLET | Freq: Every day | ORAL | Status: DC
  Administered 2020-01-17 – 2020-01-21 (×6): 10 mg via ORAL
  Filled 2020-01-17 (×6): qty 1

## 2020-01-17 MED ORDER — GABAPENTIN 100 MG PO CAPS
200.0000 mg | ORAL_CAPSULE | Freq: Three times a day (TID) | ORAL | Status: DC
  Administered 2020-01-17 (×2): 200 mg via ORAL
  Filled 2020-01-17 (×2): qty 2

## 2020-01-17 MED ORDER — CLOPIDOGREL BISULFATE 75 MG PO TABS
75.0000 mg | ORAL_TABLET | Freq: Every day | ORAL | Status: DC
  Administered 2020-01-17 – 2020-01-18 (×2): 75 mg via ORAL
  Filled 2020-01-17 (×2): qty 1

## 2020-01-17 MED ORDER — BISACODYL 10 MG RE SUPP: 10.0000 mg | Freq: Every day | RECTAL | Status: DC | PRN

## 2020-01-17 MED ORDER — BUPROPION HCL 100 MG PO TABS
100.0000 mg | ORAL_TABLET | Freq: Two times a day (BID) | ORAL | Status: DC
  Administered 2020-01-17 – 2020-01-22 (×11): 100 mg via ORAL
  Filled 2020-01-17 (×12): qty 1

## 2020-01-17 MED ORDER — GABAPENTIN 100 MG PO CAPS
200.0000 mg | ORAL_CAPSULE | Freq: Two times a day (BID) | ORAL | Status: DC
  Administered 2020-01-18 – 2020-01-22 (×9): 200 mg via ORAL
  Filled 2020-01-17 (×9): qty 2

## 2020-01-17 MED ORDER — MIRTAZAPINE 15 MG PO TABS
7.5000 mg | ORAL_TABLET | Freq: Every day | ORAL | Status: DC
  Administered 2020-01-17 – 2020-01-21 (×6): 7.5 mg via ORAL
  Filled 2020-01-17 (×6): qty 1

## 2020-01-17 MED ORDER — POLYETHYLENE GLYCOL 3350 17 G PO PACK: 17.0000 g | PACK | Freq: Every day | ORAL | Status: DC | PRN

## 2020-01-17 MED ORDER — CIPROFLOXACIN IN D5W 200 MG/100ML IV SOLN
200.0000 mg | Freq: Two times a day (BID) | INTRAVENOUS | Status: DC
  Administered 2020-01-17: 200 mg via INTRAVENOUS
  Filled 2020-01-17 (×3): qty 100

## 2020-01-17 MED ORDER — GUAIFENESIN ER 600 MG PO TB12
600.0000 mg | ORAL_TABLET | Freq: Two times a day (BID) | ORAL | Status: DC
  Administered 2020-01-17 – 2020-01-22 (×11): 600 mg via ORAL
  Filled 2020-01-17 (×11): qty 1

## 2020-01-17 MED ORDER — SODIUM CHLORIDE 0.9 % IV SOLN
2.0000 g | INTRAVENOUS | Status: DC
  Filled 2020-01-17: qty 20

## 2020-01-17 MED ORDER — UMECLIDINIUM-VILANTEROL 62.5-25 MCG/INH IN AEPB
1.0000 | INHALATION_SPRAY | Freq: Every day | RESPIRATORY_TRACT | Status: DC
  Administered 2020-01-17 – 2020-01-18 (×2): 1 via RESPIRATORY_TRACT
  Filled 2020-01-17: qty 14

## 2020-01-17 MED ORDER — PREDNISONE 10 MG PO TABS
10.0000 mg | ORAL_TABLET | Freq: Every day | ORAL | Status: DC
  Administered 2020-01-17 – 2020-01-22 (×6): 10 mg via ORAL
  Filled 2020-01-17 (×6): qty 1

## 2020-01-17 MED ORDER — CARVEDILOL 3.125 MG PO TABS
3.1250 mg | ORAL_TABLET | Freq: Two times a day (BID) | ORAL | Status: DC
  Administered 2020-01-17 – 2020-01-18 (×3): 3.125 mg via ORAL
  Filled 2020-01-17 (×3): qty 1

## 2020-01-17 MED ORDER — PRIMIDONE 50 MG PO TABS
50.0000 mg | ORAL_TABLET | Freq: Every day | ORAL | Status: DC
  Administered 2020-01-17 – 2020-01-21 (×6): 50 mg via ORAL
  Filled 2020-01-17 (×8): qty 1

## 2020-01-17 MED ORDER — IPRATROPIUM-ALBUTEROL 0.5-2.5 (3) MG/3ML IN SOLN
3.0000 mL | Freq: Three times a day (TID) | RESPIRATORY_TRACT | Status: DC
  Administered 2020-01-17 – 2020-01-18 (×3): 3 mL via RESPIRATORY_TRACT
  Filled 2020-01-17 (×4): qty 3

## 2020-01-17 MED ORDER — ONDANSETRON HCL 4 MG PO TABS: 4.0000 mg | ORAL_TABLET | Freq: Four times a day (QID) | ORAL | Status: DC | PRN

## 2020-01-17 MED ORDER — METRONIDAZOLE IN NACL 5-0.79 MG/ML-% IV SOLN
500.0000 mg | Freq: Three times a day (TID) | INTRAVENOUS | Status: DC
  Administered 2020-01-17 – 2020-01-20 (×11): 500 mg via INTRAVENOUS
  Filled 2020-01-17 (×11): qty 100

## 2020-01-17 MED ORDER — SIMVASTATIN 20 MG PO TABS
40.0000 mg | ORAL_TABLET | Freq: Every day | ORAL | Status: DC
  Administered 2020-01-17 – 2020-01-21 (×6): 40 mg via ORAL
  Filled 2020-01-17 (×6): qty 2

## 2020-01-17 MED ORDER — APIXABAN 2.5 MG PO TABS
2.5000 mg | ORAL_TABLET | Freq: Two times a day (BID) | ORAL | Status: DC
  Administered 2020-01-17 (×3): 2.5 mg via ORAL
  Filled 2020-01-17 (×5): qty 1

## 2020-01-17 MED ORDER — VANCOMYCIN 50 MG/ML ORAL SOLUTION
500.0000 mg | Freq: Four times a day (QID) | ORAL | Status: DC
  Administered 2020-01-17 – 2020-01-20 (×14): 500 mg via ORAL
  Filled 2020-01-17 (×16): qty 10

## 2020-01-17 MED ORDER — ONDANSETRON HCL 4 MG/2ML IJ SOLN: 4.0000 mg | Freq: Four times a day (QID) | INTRAMUSCULAR | Status: DC | PRN

## 2020-01-17 MED ORDER — FLUOXETINE HCL 20 MG PO CAPS
60.0000 mg | ORAL_CAPSULE | Freq: Every day | ORAL | Status: DC
  Administered 2020-01-17 – 2020-01-22 (×6): 60 mg via ORAL
  Filled 2020-01-17 (×6): qty 3
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
## 2020-01-17 NOTE — Progress Notes (Signed)
[facility] Documentation  Pt is an active pt with [facility] Palliative program.   [place] liaison to follow pt through the course of admission to assure proper follow up for discharge.   Please do not hesitate to outreach with any questions.   Thank you! Freddie Breech, RN [facility] Liaison  [phone]

## 2020-01-17 NOTE — NC FL2 (Signed)
[place] MEDICAID FL2 LEVEL OF CARE SCREENING TOOL     IDENTIFICATION  Patient Name: Sara Pierce Birthdate: January 26, 1941 Sex: female Admission Date (Current Location): 01/16/2020  [facility] and [place] Number:  Herbalist and Address:  [facility] [place], [place] [address], [place], [place] [address]      Provider Number: [id number]  Attending Physician Name and Address:  Florencia Reasons, MD  Relative Name and Phone Number:       Current Level of Care: Hospital Recommended Level of Care: [place] Prior Approval Number:    Date Approved/Denied:   PASRR Number:    Discharge Plan: SNF    Current Diagnoses: Patient Active Problem List   Diagnosis Date Noted  . Colitis 01/16/2020  . Perirectal abscess 06/02/2018  . Chronic diastolic heart failure ([place]) 05/12/2018  . NSTEMI (non-ST elevated myocardial infarction) ([place]) 04/15/2018  . COPD (chronic obstructive pulmonary disease) ([place]) 01/04/2018  . Pressure injury of skin 10/25/2017  . Absolute anemia 03/15/2015  . Anxiety 03/15/2015  . Arthritis 03/15/2015  . Cataract 03/15/2015  . Chronic constipation 03/15/2015  . Blood clotting disorder ([place]) 03/15/2015  . CAFL (chronic airflow limitation) ([place]) 03/15/2015  . Clinical depression 03/15/2015  . Diabetes ([place]) 03/15/2015  . H/O varicella 03/15/2015  . BP (high blood pressure) 03/15/2015  . Mixed incontinence 03/15/2015  . 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 Adiposity 03/15/2015  . Apnea, sleep 03/15/2015  . Disease of thyroid gland 03/15/2015  . FOM (frequency of micturition) 03/15/2015  . 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 Candida vaginitis 03/15/2015  . Abnormal ECG 03/15/2015    Orientation RESPIRATION BLADDER Height & Weight     Self, Place  O2([place] 3L) Incontinent Weight: 253 lb 15.5 oz (115.2 kg) Height:  5\' 5"  (165.1 cm)  BEHAVIORAL SYMPTOMS/MOOD NEUROLOGICAL BOWEL NUTRITION STATUS       Incontinent Diet(see DC summary)  AMBULATORY STATUS COMMUNICATION OF NEEDS Skin   Extensive Assist Verbally PU Stage and Appropriate Care   PU Stage 2 Dressing: (right thigh, foam dressing: lift to assess every shift and change PRN; left buttocks, foam dressing: lift every shift to assess and change PRN)                   Personal Care Assistance Level of Assistance  Bathing, Feeding, Dressing Bathing Assistance: Maximum assistance Feeding assistance: Limited assistance Dressing Assistance: Maximum assistance     Functional Limitations Info  Hearing, Sight Sight Info: Impaired Hearing Info: Impaired      SPECIAL CARE FACTORS FREQUENCY                       Contractures Contractures Info: Not present    Additional Factors Info  Code Status, Allergies, Psychotropic Code Status Info: DNR Allergies Info: Aspirin, Codeine, Naprosyn Naproxen, Other, Tape, Valacyclovir, Latex Psychotropic Info: Wellbutrin 100mg  2x/day; Prozac 60mg  daily; Remeron 7.5mg  daily at bed         Current Medications (01/17/2020):  This is the current hospital active medication list Current Facility-Administered Medications  Medication Dose Route Frequency Provider Last Rate Last Admin  . apixaban (ELIQUIS) tablet 2.5 mg  2.5 mg Oral BID [name] Sawyer A, DO   2.5 mg at 01/17/20 0957  . bisacodyl (DULCOLAX) suppository 10 mg  10 mg Rectal Daily PRN Margart Sickles, DO      . buPROPion [facility]) tablet 100 mg  100 mg Oral  BID [name] Sawyer A, DO   100 mg at 01/17/20 [id number]  . carvedilol (COREG) tablet 3.125 mg  3.125 mg Oral BID WC [name] Sawyer A, DO   3.125 mg at 01/17/20 0756  . clopidogrel (PLAVIX) tablet 75 mg  75 mg Oral Daily [name] Sawyer A, DO   75 mg at 01/17/20 0957  . FLUoxetine (PROZAC) capsule 60 mg  60 mg Oral Daily [name] Sawyer A, DO   60 mg at 01/17/20 [id number]  . gabapentin (NEURONTIN) capsule 200 mg  200 mg Oral TID [name] Sawyer A, DO   200 mg at 01/17/20 0957  .  guaiFENesin (MUCINEX) 12 hr tablet 600 mg  600 mg Oral BID Florencia Reasons, MD   600 mg at 01/17/20 1310  . ipratropium-albuterol (DUONEB) 0.5-2.5 (3) MG/3ML nebulizer solution 3 mL  3 mL Nebulization TID Florencia Reasons, MD   3 mL at 01/17/20 1444  . metroNIDAZOLE (FLAGYL) IVPB 500 mg  500 mg Intravenous Q8H [name] Sawyer A, DO 100 mL/hr at 01/17/20 1310 500 mg at 01/17/20 1310  . mirtazapine (REMERON) tablet 7.5 mg  7.5 mg Oral QHS [name] Sawyer A, DO   7.5 mg at 01/17/20 0238  . montelukast (SINGULAIR) tablet 10 mg  10 mg Oral QHS [name] Sawyer A, DO   10 mg at 01/17/20 [id number]  . ondansetron (ZOFRAN) tablet 4 mg  4 mg Oral Q6H PRN [name] Sawyer A, DO       Or  . ondansetron (ZOFRAN) injection 4 mg  4 mg Intravenous Q6H PRN [name] Sawyer A, DO      . polyethylene glycol (MIRALAX / GLYCOLAX) packet 17 g  17 g Oral Daily PRN [name] Sawyer A, DO      . predniSONE (DELTASONE) tablet 10 mg  10 mg Oral Q breakfast [name] Sawyer A, DO   10 mg at 01/17/20 0756  . primidone (MYSOLINE) tablet 50 mg  50 mg Oral QHS [name] Sawyer A, DO   50 mg at 01/17/20 [id number]  . simvastatin (ZOCOR) tablet 40 mg  40 mg Oral QHS [name] Sawyer A, DO   40 mg at 01/17/20 0239  . umeclidinium-vilanterol (ANORO ELLIPTA) 62.5-25 MCG/INH 1 puff  1 puff Inhalation Daily [name] Sawyer A, DO   1 puff at 01/17/20 0750  . vancomycin (VANCOCIN) 50 mg/mL oral solution 500 mg  500 mg Oral Q6H Florencia Reasons, MD   500 mg at 01/17/20 1310     Discharge Medications: Please see discharge summary for a list of discharge medications.  Relevant Imaging Results:  Relevant Lab Results:   Additional Information SS#: [id number]  Geralynn Ochs, LCSW

## 2020-01-17 NOTE — Social Work (Addendum)
Pt is LTC resident at [facility].  CSW will complete assessment and follow up with pt regarding preferred disposition.  CSW also alerted [place], RN, that pt is here and had been seen by their team in the community.   Westley Hummer, MSW, [place] [facility]

## 2020-01-17 NOTE — Progress Notes (Addendum)
CRITICAL VALUE ALERT  Critical Value: calcium 5.9  Date & Time Notied: 01/17/2020 2am  Provider Notified: Dr. Baltazar Najjar  Orders Received/Actions taken:

## 2020-01-17 NOTE — ED Notes (Signed)
Called report to [facility]

## 2020-01-17 NOTE — Progress Notes (Signed)
Patient's husband called and stated when the patient is ready to come home he wants her to come home as this is her wish. I informed him his wife is not ready to be discharged as she is still receiving IV antibiotics. He states she is able to do for herself. I do not agree with this statement. [place], CSW informed.

## 2020-01-18 ENCOUNTER — Other Ambulatory Visit: Payer: Self-pay

## 2020-01-18 ENCOUNTER — Encounter (HOSPITAL_COMMUNITY): Payer: Self-pay | Admitting: Internal Medicine

## 2020-01-18 LAB — CBC WITH DIFFERENTIAL/PLATELET
Abs Immature Granulocytes: 0.12 10*3/uL — ABNORMAL HIGH (ref 0.00–0.07)
Basophils Absolute: 0 10*3/uL (ref 0.0–0.1)
Basophils Relative: 0 %
Eosinophils Absolute: 0.1 10*3/uL (ref 0.0–0.5)
Eosinophils Relative: 1 %
HCT: 21.1 % — ABNORMAL LOW (ref 36.0–46.0)
Hemoglobin: 6.6 g/dL — CL (ref 12.0–15.0)
Immature Granulocytes: 1 %
Lymphocytes Relative: 9 %
Lymphs Abs: 1.1 10*3/uL (ref 0.7–4.0)
MCH: 30.6 pg (ref 26.0–34.0)
MCHC: 31.3 g/dL (ref 30.0–36.0)
MCV: 97.7 fL (ref 80.0–100.0)
Monocytes Absolute: 0.7 10*3/uL (ref 0.1–1.0)
Monocytes Relative: 6 %
Neutro Abs: 9.8 10*3/uL — ABNORMAL HIGH (ref 1.7–7.7)
Neutrophils Relative %: 83 %
Platelets: 190 10*3/uL (ref 150–400)
RBC: 2.16 MIL/uL — ABNORMAL LOW (ref 3.87–5.11)
RDW: 17.6 % — ABNORMAL HIGH (ref 11.5–15.5)
WBC: 11.8 10*3/uL — ABNORMAL HIGH (ref 4.0–10.5)
nRBC: 0.2 % (ref 0.0–0.2)

## 2020-01-18 LAB — COMPREHENSIVE METABOLIC PANEL
ALT: 9 U/L (ref 0–44)
AST: 10 U/L — ABNORMAL LOW (ref 15–41)
Albumin: 2.3 g/dL — ABNORMAL LOW (ref 3.5–5.0)
Alkaline Phosphatase: 49 U/L (ref 38–126)
Anion gap: 11 (ref 5–15)
BUN: 100 mg/dL — ABNORMAL HIGH (ref 8–23)
CO2: 26 mmol/L (ref 22–32)
Calcium: 7.5 mg/dL — ABNORMAL LOW (ref 8.9–10.3)
Chloride: 95 mmol/L — ABNORMAL LOW (ref 98–111)
Creatinine, Ser: 2.08 mg/dL — ABNORMAL HIGH (ref 0.44–1.00)
GFR calc Af Amer: 26 mL/min — ABNORMAL LOW (ref >60)
GFR calc non Af Amer: 23 mL/min — ABNORMAL LOW (ref >60)
Glucose, Bld: 98 mg/dL (ref 70–99)
Potassium: 4.2 mmol/L (ref 3.5–5.1)
Sodium: 132 mmol/L — ABNORMAL LOW (ref 135–145)
Total Bilirubin: 0.9 mg/dL (ref 0.3–1.2)
Total Protein: 4.8 g/dL — ABNORMAL LOW (ref 6.5–8.1)

## 2020-01-18 LAB — HEMOGLOBIN AND HEMATOCRIT, BLOOD
HCT: 21.4 % — ABNORMAL LOW (ref 36.0–46.0)
HCT: 26.1 % — ABNORMAL LOW (ref 36.0–46.0)
HCT: 29.1 % — ABNORMAL LOW (ref 36.0–46.0)
Hemoglobin: 6.7 g/dL — CL (ref 12.0–15.0)
Hemoglobin: 8.2 g/dL — ABNORMAL LOW (ref 12.0–15.0)
Hemoglobin: 9.3 g/dL — ABNORMAL LOW (ref 12.0–15.0)

## 2020-01-18 LAB — FOLATE RBC
Folate, Hemolysate: 295 ng/mL
Folate, RBC: 1209 ng/mL (ref >498)
Hematocrit: 24.4 % — ABNORMAL LOW (ref 34.0–46.6)

## 2020-01-18 LAB — PREPARE RBC (CROSSMATCH)

## 2020-01-18 LAB — EXPECTORATED SPUTUM ASSESSMENT W GRAM STAIN, RFLX TO RESP C

## 2020-01-18 MED ORDER — IPRATROPIUM-ALBUTEROL 0.5-2.5 (3) MG/3ML IN SOLN
3.0000 mL | Freq: Three times a day (TID) | RESPIRATORY_TRACT | Status: DC
  Administered 2020-01-19 – 2020-01-22 (×12): 3 mL via RESPIRATORY_TRACT
  Filled 2020-01-18 (×13): qty 3

## 2020-01-18 MED ORDER — SODIUM CHLORIDE 0.9% IV SOLUTION: Freq: Once | INTRAVENOUS | Status: AC

## 2020-01-18 MED ORDER — IPRATROPIUM-ALBUTEROL 0.5-2.5 (3) MG/3ML IN SOLN: 3.0000 mL | Freq: Two times a day (BID) | RESPIRATORY_TRACT | Status: DC

## 2020-01-18 MED ORDER — HYDROCORTISONE NA SUCCINATE PF 100 MG IJ SOLR
50.0000 mg | Freq: Four times a day (QID) | INTRAMUSCULAR | Status: DC
  Administered 2020-01-18 – 2020-01-19 (×3): 50 mg via INTRAVENOUS
  Filled 2020-01-18 (×3): qty 2

## 2020-01-18 MED ORDER — IPRATROPIUM-ALBUTEROL 0.5-2.5 (3) MG/3ML IN SOLN
3.0000 mL | Freq: Four times a day (QID) | RESPIRATORY_TRACT | Status: DC
  Administered 2020-01-18 (×2): 3 mL via RESPIRATORY_TRACT
  Filled 2020-01-18: qty 3

## 2020-01-18 MED ORDER — ORAL CARE MOUTH RINSE
15.0000 mL | Freq: Two times a day (BID) | OROMUCOSAL | Status: DC
  Administered 2020-01-19 – 2020-01-22 (×5): 15 mL via OROMUCOSAL

## 2020-01-18 NOTE — Evaluation (Signed)
Clinical/Bedside Swallow Evaluation Patient Details  Name: Sara Pierce MRN: [id number] Date of Birth: May 08, 1941  Today's Date: 01/18/2020 Time: SLP Start Time (ACUTE ONLY): 1147 SLP Stop Time (ACUTE ONLY): 1215 SLP Time Calculation (min) (ACUTE ONLY): 28 min  Past Medical History:  Past Medical History:  Diagnosis Date  . Arthritis   . Cardiomyopathy ([place])   . Cataract   . Cerebral hemorrhage ([place])   . CHF (congestive heart failure) ([place])   . Chronic kidney disease   . Clotting disorder ([place])   . COPD (chronic obstructive pulmonary disease) ([place])   . Depression   . Diabetes mellitus without complication ([place])   . Hypertension   . Mixed incontinence   . Obesity   . Sleep apnea   . Thyroid disease   . Urinary frequency   . Vaginal atrophy   . Varicose veins    Past Surgical History:  Past Surgical History:  Procedure Laterality Date  . APPENDECTOMY    . Cardiac Catherization    . COLONOSCOPY WITH PROPOFOL N/A 04/28/2016   Procedure: COLONOSCOPY WITH PROPOFOL;  Surgeon: Manya Silvas, MD;  Location[facility] ENDOSCOPY;  Service: Endoscopy;  Laterality: N/A;  . COLONOSCOPY WITH PROPOFOL N/A 04/29/2016   Procedure: COLONOSCOPY WITH PROPOFOL;  Surgeon: Manya Silvas, MD;  [facility];  Service: Endoscopy;  Laterality: N/A;  . JOINT REPLACEMENT    . STRABISMUS SURGERY    . TONSILLECTOMY    . TUBAL LIGATION     HPI:  Ms Sara Pierce, 78y/f, presented to ED with vomiting and abdominal pain, likely related to Colitis. PMH of COPD (home O2 3L), CHF, CAD, A-fib, hyperlipidemia, DM type 2, and hypothyroidism. No history of dsyphagia per chart. MD noted wheezing and congestion this morning on examination.    Assessment / Plan / Recommendation Clinical Impression  Clinical swallow evalaution completed at bedside. Pt exhibits oropharyngeal dysphagia with a suspected esophageal componenet. Pt reports choking and belching all the time. Congested and productive cough  with wheezing at baseline. Oral care provided. Oral motor exam revealed generalized weakness with missing teeth. Prolonged mastication, bolus manipulation and transfer with cracker. Immediate throat clear with liquids and purees and delayed throat clear with cracker. Belching noted throughout evaluation. Recommend instrumental exam to further evaluate swallow function. Pt is agreeable to MBS. SLP Visit Diagnosis: Dysphagia, oropharyngeal phase (R13.12)                     Other  Recommendations Oral Care Recommendations: Oral care QID   Follow up Recommendations          Swallow Study   General Date of Onset: 01/17/20 HPI: Ms Sara Pierce, 78y/f, presented to ED with vomiting and abdominal pain, likely related to Colitis. PMH of COPD (home O2 3L), CHF, CAD, A-fib, hyperlipidemia, DM type 2, and hypothyroidism. No history of dsyphagia per chart. MD noted wheezing and congestion this morning on examination.  Type of Study: Bedside Swallow Evaluation Previous Swallow Assessment: none Diet Prior to this Study: Regular;Thin liquids Temperature Spikes Noted: No Respiratory Status: Nasal cannula History of Recent Intubation: No Behavior/Cognition: Alert;Cooperative;Pleasant mood Oral Cavity Assessment: Within Functional Limits Oral Care Completed by SLP: Yes Oral Cavity - Dentition: Poor condition;Missing dentition Vision: Functional for self-feeding Self-Feeding Abilities: Able to feed self Patient Positioning: Upright in bed Baseline Vocal Quality: Normal Volitional Cough: Weak;Congested(wheezing) Volitional Swallow: Able to elicit    Oral/Motor/Sensory Function Overall Oral Motor/Sensory Function: Generalized oral weakness  Ice Chips Ice chips: Within functional limits   Thin Liquid Thin Liquid: Impaired Presentation: Cup;Straw Pharyngeal  Phase Impairments: Throat Clearing - Immediate    Nectar Thick Nectar Thick Liquid: Not tested   Honey Thick Honey Thick Liquid: Not  tested   Puree Puree: Within functional limits Presentation: Spoon   Solid     Solid: Impaired Presentation: Self Fed Oral Phase Functional Implications: Prolonged oral transit;Oral residue Pharyngeal Phase Impairments: Throat Clearing - Delayed      Wynelle Bourgeois., MA, CCC-SLP 01/18/2020,12:47 PM

## 2020-01-18 NOTE — Progress Notes (Signed)
Patient had a large amount of bloody mucous from her rectum. Dr. Erlinda Hong made aware. Will continue to monitor.

## 2020-01-18 NOTE — Progress Notes (Addendum)
PROGRESS NOTE  Sara Pierce [id number] DOB: 09-12-1941 DOA: 01/16/2020 PCP: Marygrace Drought, MD  Brief summary: Per ED triage note at 3pm on 2/3"Pt from [facility] via EMS . Pt A/O with 100% with 8 liters via non re breather . EMS reported to during ADLs the staff thought the Pt died and staff preformed CPR.  Pt reported to EMS that last night she vometed and has been [facility] since then ." Patient reports cpr aborted by the patient herself.  Per patient she vomited the night prior to coming to the hospital, has been more short of breath since vomited, she also reports ab pain and diarrhea  on presentation. Per EDP oxygen requirement has improved since arrival to the ED.   HPI/Recap of past 24 hours:  hgb dropped, getting 1prbc transfusion  Wbc and cr improved some Reports diarrhea slowed down, denies ab pain, no n/v, she is sitting up in bed eating breakfast.  She continue to sound congested and wheezing, she does not appear to be in respiratory distress  She is not a reliable historian  Assessment/Plan: Active Problems:   Colitis  cdiff colitis Severe with significant leukocytosis and cr elevation Continue  oval vanc,  iv flagyl Appear improving, diarrhea slowed down, wbc and cr remain elevated but slightly improved    Acute on chronic hypoxic respiratory failure, h/o copd home o2 (3liter) and prednisone dependent -cxr :No acute disease.  Left basilar atelectasis noted +wheezing on exam ? Upper airway sounds -schedule nebs, add on mucinex, continue prednisone  Speech eval  AKI Likely from cdiff infection, no obstruction on CT ab Renal dosing meds Cr remain elevated but trending down slightly  Hyperkalemia: K5.9 on presentation,  normalized  Paroxysmal atrial fibrillation Coronary artery disease Denies chest pain, currently in sinus rhythm -Continue home meds Coreg, Plavix.  Patient has aspirin allergy. Hold eliquis due to drop of hgb  Chronic combined  chf lvef slightly reduced at 50%  Hold home meds metolazone/demadex due to [facility] monitor volume status, no edema, cxr no acute findings, does not appear to be in acute exacerbation  Macrocytic anemia - baseline hgb appears around 11 a year ago - tbili wnl, b12 363, mildly iron deficiency, retic count pending -Hemoglobin 7.8 on presentation, dropped to 6.6 this am, No overt active bleeding. Though I won/t be surprised if her  occult blood test turn positive due to colitis, -fobt pending -getting prbc transfusionx1 now, repeat cbc in am  Addendum: She had a large amount of bloody mucous from her rectum this afternoon around 3:45pm, she denies abdominal pain, no n/v, no fever, she does not appear symptomatic at this moment but systolic blood pressure dropped to 87. There is no tachycardia.  Will go ahead give another prbc, hold plavix and eliquis Start stress dose steroid for blood pressure supports, will avoid ivf due to h/o chf Case discussed with GI Dr Benson Norway who graciously accepted the consult and will evaluate patient.   Morbid obesity: Body mass index is 43.07 kg/m.    Per HPI patient " recently on 12/12/2019 when she was hospitalized for Covid pneumonia.  She was discharged back to the facility on 12/25/2019, and has reportedly been treated for pneumonia and volume overload since then"  "Of note, patient met with palliative care on 01/08/2020 who initiated goals of care discussions, as the patient has been considering going home with home hospice.  Discussed her goals of care at the time of admission, patient states she  would like to proceed with care at this time, though would like to update her CODE STATUS to DNR/limited and potentially go home with home hospice pending further discussions and progression of her symptoms/clinical status overall."  Pressure Injury 01/04/18 Stage II -  Partial thickness loss of dermis presenting as a shallow open ulcer with a red, pink wound bed  without slough. some adipose tissue showing (Active)  01/04/18 1300  Location: [place]  Location Orientation:   Staging: Stage II -  Partial thickness loss of dermis presenting as a shallow open ulcer with a red, pink wound bed without slough.  Wound Description (Comments): some adipose tissue showing  Present on Admission: Yes     Pressure Injury 01/17/20 Thigh Posterior;Proximal;Right Stage 2 -  Partial thickness loss of dermis presenting as a shallow open injury with a red, pink wound bed without slough. stage 2 pressure injury (Active)  01/17/20 0145  Location: Thigh  Location Orientation: Posterior;Proximal;Right  Staging: Stage 2 -  Partial thickness loss of dermis presenting as a shallow open injury with a red, pink wound bed without slough.  Wound Description (Comments): stage 2 pressure injury  Present on Admission: Yes     Pressure Injury 01/17/20 Buttocks Left Stage 2 -  Partial thickness loss of dermis presenting as a shallow open injury with a red, pink wound bed without slough. stage 2 pressure ulcer (Active)  01/17/20 0146  Location: Buttocks  Location Orientation: Left  Staging: Stage 2 -  Partial thickness loss of dermis presenting as a shallow open injury with a red, pink wound bed without slough.  Wound Description (Comments): stage 2 pressure ulcer  Present on Admission: Yes        DVT Prophylaxis: scd  Code Status: DNR  Family Communication: patient   Disposition Plan: Per Education officer, museum,  "At this time pt requires total care; per notes pt was not being cared for at home and made the decision that she wanted to go to SNF for long term"  Patient give permission to talk to her husband   Consultants:  Palliative care  Speech therapy  Social worker  Procedures:  none  Antibiotics:  Oral vanc and iv flagyl   Objective: BP 107/68   Pulse 61   Temp 98 F (36.7 C) (Oral)   Resp 18   Ht '5\' 5"'$  (1.651 m)   Wt 117.4 kg   SpO2 95%   BMI 43.07  kg/m   Intake/Output Summary (Last 24 hours) at 01/18/2020 0750 Last data filed at 01/18/2020 0645 Gross per 24 hour  Intake 772.5 ml  Output 1200 ml  Net -427.5 ml   Filed Weights   01/17/20 0114 01/18/20 0645  Weight: 115.2 kg 117.4 kg    Exam: Patient is examined daily including today on 01/18/2020, exams remain the same as of yesterday except that has changed    General:  Frail, chronically ill, NAD  Cardiovascular: RRR  Respiratory: + wheezing (uuper airway sound?)  Abdomen: Soft/ND/NT, positive BS  Musculoskeletal: No Edema, bilateral feet chronically  inversly rotated ( reports has not been able to stand up for two yrs), no edema appreciated  Neuro: alert, oriented to the months, know she is at [facility], reports it is year 2001, 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   Skin: see pic below      Data Reviewed: Basic Metabolic Panel: Recent Labs  Lab 01/16/20 1540 01/17/20 0047 01/17/20 0641 01/17/20 1250 01/18/20 0155  NA 132* 133* 131* 131*  132*  K 5.9* 4.5 5.7* 5.4* 4.2  CL 91* 102 93* 92* 95*  CO2 _0 GLUCOSE 110* 83 89 114* 98  BUN 98* 85* 104* 101* 100*  CREATININE 2.94* 2.12* 2.63* 2.31* 2.08*  CALCIUM 8.0* 5.9* 7.4* 7.3* 7.5*  MG  --  1.8  --   --   --   PHOS  --  5.3*  --   --   --    Liver Function Tests: Recent Labs  Lab 01/16/20 1540 01/17/20 0641 01/18/20 0155  AST 15 20 10*  ALT _1 ALKPHOS 66 71 49  BILITOT 0.7 1.0 0.9  PROT 5.4* 5.8* 4.8*  ALBUMIN 2.6* 2.6* 2.3*   Recent Labs  Lab 01/16/20 1540  LIPASE 17   No results for input(s): AMMONIA in the last 168 hours. CBC: Recent Labs  Lab 01/16/20 1540 01/17/20 0641 01/18/20 0155 01/18/20 0402  WBC 29.8* 20.0* 11.8*  --   NEUTROABS 29.2* 17.7* 9.8*  --   HGB 7.8* 7.8* 6.6* 6.7*  HCT 26.1* 25.4* 21.1* 21.4*  MCV 102.4* 101.6* 97.7  --   PLT 214 184 190  --    Cardiac Enzymes:   No results for input(s): CKTOTAL, CKMB, CKMBINDEX, TROPONINI in the last  168 hours. BNP (last 3 results) Recent Labs    01/17/20 0144  BNP 259.7*    ProBNP (last 3 results) No results for input(s): PROBNP in the last 8760 hours.  CBG: No results for input(s): GLUCAP in the last 168 hours.  Recent Results (from the past 240 hour(s))  SARS CORONAVIRUS 2 (TAT 6-24 HRS) Nasopharyngeal Nasopharyngeal Swab     Status: None   Collection Time: 01/16/20 11:30 PM   Specimen: Nasopharyngeal Swab  Result Value Ref Range Status   SARS Coronavirus 2 NEGATIVE NEGATIVE Final    Comment: (NOTE) SARS-CoV-2 target nucleic acids are NOT DETECTED. The SARS-CoV-2 RNA is generally detectable in upper and lower respiratory specimens during the acute phase of infection. Negative results do not preclude SARS-CoV-2 infection, do not rule out co-infections with other pathogens, and should not be used as the sole basis for treatment or other patient management decisions. Negative results must be combined with clinical observations, patient history, and epidemiological information. The expected result is Negative. Fact Sheet for Patients: SugarRoll.be Fact Sheet for Healthcare Providers: https://www.woods-mathews.com/ This test is not yet approved or cleared by the [place] FDA and  has been authorized for detection and/or diagnosis of SARS-CoV-2 by FDA under an Emergency Use Authorization (EUA). This EUA will remain  in effect (meaning this test can be used) for the duration of the COVID-19 declaration under Section 56 4(b)(1) of the Act, 21 U.S.C. section 360bbb-3(b)(1), unless the authorization is terminated or revoked sooner. Performed at [facility], [place] [address], [place], [place] [address]   C difficile quick scan w PCR reflex     Status: Abnormal   Collection Time: 01/17/20  3:05 AM   Specimen: STOOL  Result Value Ref Range Status   C Diff antigen POSITIVE (A) NEGATIVE Final   C Diff toxin NEGATIVE NEGATIVE  Final   C Diff interpretation Results are indeterminate. See PCR results.  Final    Comment: Performed at [facility], [place] [address], [place], [place] [address]  C. Diff by PCR, Reflexed     Status: Abnormal   Collection Time: 01/17/20  3:05 AM  Result Value Ref Range Status  Toxigenic C. Difficile by PCR POSITIVE (A) NEGATIVE Final    Comment: Positive for toxigenic C. difficile with little to no toxin production. Only treat if clinical presentation suggests symptomatic illness. Performed at [facility], [place] [address] [place], [place] [address]   MRSA PCR Screening     Status: None   Collection Time: 01/17/20  4:32 PM   Specimen: Nasal Mucosa; Nasopharyngeal  Result Value Ref Range Status   MRSA by PCR NEGATIVE NEGATIVE Final    Comment:        The GeneXpert MRSA Assay (FDA approved for NASAL specimens only), is one component of a comprehensive MRSA colonization surveillance program. It is not intended to diagnose MRSA infection nor to guide or monitor treatment for MRSA infections. Performed at [facility], [place] [address] [place], [place] [address]      Studies: DG CHEST PORT 1 VIEW  Result Date: 01/17/2020 CLINICAL DATA:  Cough in a patient with a history of COPD. EXAM: PORTABLE CHEST 1 VIEW COMPARISON:  PA and lateral chest 01/16/2020. Single-view of the chest 09/16/2018. FINDINGS: There is left basilar atelectasis. Right lung is clear. Heart size is upper normal. Aortic atherosclerosis noted. No pneumothorax or pleural fluid. No acute bony abnormality. IMPRESSION: No acute disease.  Left basilar atelectasis noted. Atherosclerosis. Electronically Signed   By: Inge Rise M.D.   On: 01/17/2020 12:29    Scheduled Meds: . buPROPion  100 mg Oral BID  . carvedilol  3.125 mg Oral BID WC  . clopidogrel  75 mg Oral Daily  . FLUoxetine  60 mg Oral Daily  . gabapentin  200 mg Oral BID  . guaiFENesin  600 mg Oral BID  . ipratropium-albuterol  3 mL  Nebulization TID  . mirtazapine  7.5 mg Oral QHS  . montelukast  10 mg Oral QHS  . predniSONE  10 mg Oral Q breakfast  . primidone  50 mg Oral QHS  . simvastatin  40 mg Oral QHS  . umeclidinium-vilanterol  1 puff Inhalation Daily  . vancomycin  500 mg Oral Q6H    Continuous Infusions: . metronidazole Stopped (01/18/20 0622)     Time spent: 8mns I have personally reviewed and interpreted on  01/18/2020 daily labs,  imagings as discussed above under date review session and assessment and plans.  I reviewed all nursing notes, pharmacy notes,   vitals, pertinent old records  I have discussed plan of care as described above with RN , patient  on 01/18/2020   FFlorencia ReasonsMD, PhD, FACP  [facility]  Available via Epic secure chat 7am-7pm for nonurgent issues Please page for urgent issues, pager number available through [email or web address] .   01/18/2020, 7:50 AM  LOS: 2 days

## 2020-01-18 NOTE — Plan of Care (Signed)

## 2020-01-18 NOTE — Progress Notes (Signed)
CRITICAL VALUE ALERT  Critical Value:  Hemoglobin 6.6  Date & Time Notied:  01/18/2020 @ 0307  Provider Notified: Beaulah Corin, NP @ [phone]  Orders Received/Actions taken: Repeat H&H to confirm and type and screen ordered.

## 2020-01-18 NOTE — Progress Notes (Signed)
Paged Dewaine Oats, NP about pt. Hemoglobin of 6.7 after redraw. Received an order to transfuse 1 unit of blood.

## 2020-01-18 NOTE — Consult Note (Signed)
Reason for Consult: Hematochezia, anemia, and C. diff Referring Physician: [facility]  Jessica Priest HPI: This is a 79 year old female with a PMH of COPD on 3 liters of home oxygen and steroids, CAD, CHF, afib and multiple other medical comorbidities admitted for c. Diff colitis.  She was recently hospitalized for COVID-19 the end of December through the beginning of January.  She was recovering well until 4 days prior to admission.  At that time she started to have diarrhea, vomiting, and abdominal pain in the LLQ.  There was no report of hematochezia, melena, or fever.  As a result of her symptoms she presented to the ER and she was found to have a colits in the sigmoid colon.  Further testing showed that she was positive for C. Diff.  Vancomycin and metronidazole were initiated and she started to improve with a decline in her WBC and a decrease in her stooling.  She has a history of anemia, but her HGB was noted to be at 7.8 g/dL, which is a drop from her HGB on 09/16/2018 at 11.5 g/dL. The patient was on Plavix and Eliquis for her afib.  There is some LLQ abdominal pain.  This afternoon she had a large bloody mucus bowel movement and her HGB dropped down to the 6 range.  One unit of PRBC was added onto her regimen.  Past Medical History:  Diagnosis Date  . Arthritis   . Cardiomyopathy ([place])   . Cataract   . Cerebral hemorrhage ([place])   . CHF (congestive heart failure) ([place])   . Chronic kidney disease   . Clotting disorder ([place])   . COPD (chronic obstructive pulmonary disease) ([place])   . Depression   . Diabetes mellitus without complication ([place])   . Hypertension   . Mixed incontinence   . Obesity   . Sleep apnea   . Thyroid disease   . Urinary frequency   . Vaginal atrophy   . Varicose veins     Past Surgical History:  Procedure Laterality Date  . APPENDECTOMY    . Cardiac Catherization    . COLONOSCOPY WITH PROPOFOL N/A 04/28/2016   Procedure: COLONOSCOPY WITH PROPOFOL;   Surgeon: Manya Silvas, MD;  Location: [facility] ENDOSCOPY;  Service: Endoscopy;  Laterality: N/A;  . COLONOSCOPY WITH PROPOFOL N/A 04/29/2016   Procedure: COLONOSCOPY WITH PROPOFOL;  Surgeon: Manya Silvas, MD;  Location: [facility] ENDOSCOPY;  Service: Endoscopy;  Laterality: N/A;  . JOINT REPLACEMENT    . STRABISMUS SURGERY    . TONSILLECTOMY    . TUBAL LIGATION      Family History  Problem Relation Age of Onset  . Heart attack Father   . Coronary artery disease Father   . Hypertension Father   . Hyperlipidemia Father   . Heart attack Mother   . Hypertension Mother   . Hyperlipidemia Mother   . Breast cancer Neg Hx     Social History:  reports that she has quit smoking. Her smoking use included cigarettes. She has a 22.00 pack-year smoking history. She has never used smokeless tobacco. She reports that she does not drink alcohol or use drugs.  Allergies:  Allergies  Allergen Reactions  . Aspirin Swelling  . Codeine Itching  . Naprosyn [Naproxen] Other (See Comments)    Per MAR pt allergic  . Other     Elastic Bandages/ Supports Per MAR  . Tape Hives  . Valacyclovir   . Latex Rash    Medications:  Scheduled: . buPROPion  100 mg Oral BID  . FLUoxetine  60 mg Oral Daily  . gabapentin  200 mg Oral BID  . guaiFENesin  600 mg Oral BID  . hydrocortisone sod succinate (SOLU-CORTEF) inj  50 mg Intravenous Q6H  . ipratropium-albuterol  3 mL Nebulization Q6H  . mouth rinse  15 mL Mouth Rinse BID  . mirtazapine  7.5 mg Oral QHS  . montelukast  10 mg Oral QHS  . predniSONE  10 mg Oral Q breakfast  . primidone  50 mg Oral QHS  . simvastatin  40 mg Oral QHS  . vancomycin  500 mg Oral Q6H   Continuous: . metronidazole Stopped (01/18/20 1416)    Results for orders placed or performed during the hospital encounter of 01/16/20 (from the past 24 hour(s))  Comprehensive metabolic panel     Status: Abnormal   Collection Time: 01/18/20  1:55 AM  Result Value Ref Range   Sodium 132  (L) 135 - 145 mmol/L   Potassium 4.2 3.5 - 5.1 mmol/L   Chloride 95 (L) 98 - 111 mmol/L   CO2 26 22 - 32 mmol/L   Glucose, Bld 98 70 - 99 mg/dL   BUN 100 (H) 8 - 23 mg/dL   Creatinine, Ser 2.08 (H) 0.44 - 1.00 mg/dL   Calcium 7.5 (L) 8.9 - 10.3 mg/dL   Total Protein 4.8 (L) 6.5 - 8.1 g/dL   Albumin 2.3 (L) 3.5 - 5.0 g/dL   AST 10 (L) 15 - 41 U/L   ALT 9 0 - 44 U/L   Alkaline Phosphatase 49 38 - 126 U/L   Total Bilirubin 0.9 0.3 - 1.2 mg/dL   GFR calc non Af Amer 23 (L) >60 mL/min   GFR calc Af Amer 26 (L) >60 mL/min   Anion gap 11 5 - 15  CBC WITH DIFFERENTIAL     Status: Abnormal   Collection Time: 01/18/20  1:55 AM  Result Value Ref Range   WBC 11.8 (H) 4.0 - 10.5 K/uL   RBC 2.16 (L) 3.87 - 5.11 MIL/uL   Hemoglobin 6.6 (LL) 12.0 - 15.0 g/dL   HCT 21.1 (L) 36.0 - 46.0 %   MCV 97.7 80.0 - 100.0 fL   MCH 30.6 26.0 - 34.0 pg   MCHC 31.3 30.0 - 36.0 g/dL   RDW 17.6 (H) 11.5 - 15.5 %   Platelets 190 150 - 400 K/uL   nRBC 0.2 0.0 - 0.2 %   Neutrophils Relative % 83 %   Neutro Abs 9.8 (H) 1.7 - 7.7 K/uL   Lymphocytes Relative 9 %   Lymphs Abs 1.1 0.7 - 4.0 K/uL   Monocytes Relative 6 %   Monocytes Absolute 0.7 0.1 - 1.0 K/uL   Eosinophils Relative 1 %   Eosinophils Absolute 0.1 0.0 - 0.5 K/uL   Basophils Relative 0 %   Basophils Absolute 0.0 0.0 - 0.1 K/uL   Immature Granulocytes 1 %   Abs Immature Granulocytes 0.12 (H) 0.00 - 0.07 K/uL  Type and screen [place]     Status: None (Preliminary result)   Collection Time: 01/18/20  4:00 AM  Result Value Ref Range   ABO/RH(D) O POS    Antibody Screen NEG    Sample Expiration 01/21/2020,2359    Unit Number [id number]    Blood Component Type RED CELLS,LR    Unit division 00    Status of Unit ISSUED    Transfusion Status OK TO TRANSFUSE  Crossmatch Result Compatible    Unit Number [id number]    Blood Component Type RED CELLS,LR    Unit division 00    Status of Unit ISSUED    Transfusion Status  OK TO TRANSFUSE    Crossmatch Result      Compatible Performed at [facility], [place] [address] [place], [place] [address]   Hemoglobin and hematocrit, blood     Status: Abnormal   Collection Time: 01/18/20  4:02 AM  Result Value Ref Range   Hemoglobin 6.7 (LL) 12.0 - 15.0 g/dL   HCT 21.4 (L) 36.0 - 46.0 %  Prepare RBC     Status: None   Collection Time: 01/18/20  5:08 AM  Result Value Ref Range   Order Confirmation      ORDER PROCESSED BY BLOOD BANK Performed at [facility], [place] [address], [place], [place] [address]   Hemoglobin and hematocrit, blood     Status: Abnormal   Collection Time: 01/18/20 12:17 PM  Result Value Ref Range   Hemoglobin 8.2 (L) 12.0 - 15.0 g/dL   HCT 26.1 (L) 36.0 - 46.0 %  Prepare RBC     Status: None   Collection Time: 01/18/20  3:54 PM  Result Value Ref Range   Order Confirmation      ORDER PROCESSED BY BLOOD BANK Performed at [facility], [place] [address] [place], [place] [address]      CT ABDOMEN PELVIS WO CONTRAST  Result Date: 01/16/2020 CLINICAL DATA:  Nausea and vomiting. EXAM: CT ABDOMEN AND PELVIS WITHOUT CONTRAST TECHNIQUE: Multidetector CT imaging of the abdomen and pelvis was performed following the standard protocol without IV contrast. COMPARISON:  March 01, 2018 FINDINGS: Lower chest: Moderate-severity atelectatic changes are seen within the bilateral lung bases. Hepatobiliary: No focal liver abnormality is seen. No gallstones, gallbladder wall thickening, or biliary dilatation. Pancreas: Unremarkable. No pancreatic ductal dilatation or surrounding inflammatory changes. Spleen: Normal in size without focal abnormality. Adrenals/Urinary Tract: There is mild diffuse enlargement of the right adrenal gland. 1.4 cm x 1.2 cm and 1.3 cm x 1.1 cm low-attenuation left adrenal masses are noted. Kidneys are normal, without renal calculi, focal lesion, or hydronephrosis. Bladder is unremarkable. Stomach/Bowel: There is a small hiatal  hernia. The appendix is not identified. No evidence of bowel dilatation. There is mild diffuse thickening of the mid and distal sigmoid colon with a mild amount of pericolonic inflammatory fat stranding. Vascular/Lymphatic: Marked severity aortic atherosclerosis. No enlarged abdominal or pelvic lymph nodes. Reproductive: Ill-defined coarse appearing uterine calcifications are seen. Other: No abdominal wall hernia or abnormality. No abdominopelvic ascites. Musculoskeletal: Multilevel degenerative changes seen throughout the lumbar spine. IMPRESSION: 1. Mild to moderate severity colitis involving the mid and distal sigmoid colon. 2. Small hiatal hernia. 3. 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 4. Marked severity aortic atherosclerosis. Aortic Atherosclerosis (ICD10-I70.0). Electronically Signed   By: Virgina Norfolk M.D.   On: 01/16/2020 21:03   DG CHEST PORT 1 VIEW  Result Date: 01/17/2020 CLINICAL DATA:  Cough in a patient with a history of COPD. EXAM: PORTABLE CHEST 1 VIEW COMPARISON:  PA and lateral chest 01/16/2020. Single-view of the chest 09/16/2018. FINDINGS: There is left basilar atelectasis. Right lung is clear. Heart size is upper normal. Aortic atherosclerosis noted. No pneumothorax or pleural fluid. No acute bony abnormality. IMPRESSION: No acute disease.  Left basilar atelectasis noted. Atherosclerosis. Electronically Signed   By: Inge Rise M.D.   On: 01/17/2020  12:29    ROS:  As stated above in the HPI otherwise negative.  Blood pressure 106/66, pulse 70, temperature 98.4 F (36.9 C), temperature source Oral, resp. rate 20, height 5\' 5"  (1.651 m), weight 117.4 kg, SpO2 95 %.    PE: Gen: NAD, Alert and Oriented HEENT:  [place]/AT, EOMI Neck: Supple, no LAD Lungs: CTA Bilaterally CV: RRR without M/G/R ABM: Soft, NTND, +BS Ext: No C/C/E  Assessment/Plan: 1) Hematochezia. 2) C. Diff colitis. 3) Anemia. 4) Severe COPD.   The patient is  currently stable.  She is responding to her antibiotic treatment.  Her HGB did drop.  The prior colonoscopy in 2017 and her prior CT scans were negative for any evidence of diverticula.  Currently she is stable.  The thought is that her bleeding originated from her C. Diff colitis.  There was no pain with this episode of bleeding to suggest an ischemic colitis.  Plan: 1) Agree with tranfsusion. 2) Continue with antibiotics. 3) Follow HGB. 4) If necessary, an FFS without sedation can be performed.  Arcola Freshour D 01/18/2020, 5:02 PM

## 2020-01-19 DIAGNOSIS — K625 Hemorrhage of anus and rectum: Secondary | ICD-10-CM

## 2020-01-19 DIAGNOSIS — A0472 Enterocolitis due to Clostridium difficile, not specified as recurrent: Principal | ICD-10-CM

## 2020-01-19 LAB — TYPE AND SCREEN
ABO/RH(D): O POS
Antibody Screen: NEGATIVE
Unit division: 0
Unit division: 0

## 2020-01-19 LAB — CBC WITH DIFFERENTIAL/PLATELET
Abs Immature Granulocytes: 0.15 10*3/uL — ABNORMAL HIGH (ref 0.00–0.07)
Basophils Absolute: 0 10*3/uL (ref 0.0–0.1)
Basophils Relative: 0 %
Eosinophils Absolute: 0 10*3/uL (ref 0.0–0.5)
Eosinophils Relative: 0 %
HCT: 28 % — ABNORMAL LOW (ref 36.0–46.0)
Hemoglobin: 9 g/dL — ABNORMAL LOW (ref 12.0–15.0)
Immature Granulocytes: 2 %
Lymphocytes Relative: 5 %
Lymphs Abs: 0.5 10*3/uL — ABNORMAL LOW (ref 0.7–4.0)
MCH: 31 pg (ref 26.0–34.0)
MCHC: 32.1 g/dL (ref 30.0–36.0)
MCV: 96.6 fL (ref 80.0–100.0)
Monocytes Absolute: 0.5 10*3/uL (ref 0.1–1.0)
Monocytes Relative: 5 %
Neutro Abs: 8.6 10*3/uL — ABNORMAL HIGH (ref 1.7–7.7)
Neutrophils Relative %: 88 %
Platelets: 190 10*3/uL (ref 150–400)
RBC: 2.9 MIL/uL — ABNORMAL LOW (ref 3.87–5.11)
RDW: 16.5 % — ABNORMAL HIGH (ref 11.5–15.5)
WBC: 9.7 10*3/uL (ref 4.0–10.5)
nRBC: 0.2 % (ref 0.0–0.2)

## 2020-01-19 LAB — MAGNESIUM: Magnesium: 2.4 mg/dL (ref 1.7–2.4)

## 2020-01-19 LAB — BPAM RBC
Blood Product Expiration Date: 202102112359
Blood Product Expiration Date: 202102262359
ISSUE DATE / TIME: 202102050621
ISSUE DATE / TIME: 202102051616
Unit Type and Rh: 5100
Unit Type and Rh: 5100

## 2020-01-19 LAB — COMPREHENSIVE METABOLIC PANEL
ALT: 11 U/L (ref 0–44)
AST: 18 U/L (ref 15–41)
Albumin: 2.5 g/dL — ABNORMAL LOW (ref 3.5–5.0)
Alkaline Phosphatase: 45 U/L (ref 38–126)
Anion gap: 10 (ref 5–15)
BUN: 58 mg/dL — ABNORMAL HIGH (ref 8–23)
CO2: 27 mmol/L (ref 22–32)
Calcium: 8.1 mg/dL — ABNORMAL LOW (ref 8.9–10.3)
Chloride: 101 mmol/L (ref 98–111)
Creatinine, Ser: 0.93 mg/dL (ref 0.44–1.00)
GFR calc Af Amer: >60 mL/min (ref >60)
GFR calc non Af Amer: 60 mL/min — ABNORMAL LOW (ref >60)
Glucose, Bld: 142 mg/dL — ABNORMAL HIGH (ref 70–99)
Potassium: 4.3 mmol/L (ref 3.5–5.1)
Sodium: 138 mmol/L (ref 135–145)
Total Bilirubin: 0.9 mg/dL (ref 0.3–1.2)
Total Protein: 5.3 g/dL — ABNORMAL LOW (ref 6.5–8.1)

## 2020-01-19 LAB — RETICULOCYTES
Immature Retic Fract: 15.4 % (ref 2.3–15.9)
RBC.: 2.89 MIL/uL — ABNORMAL LOW (ref 3.87–5.11)
Retic Count, Absolute: 58.1 10*3/uL (ref 19.0–186.0)
Retic Ct Pct: 2 % (ref 0.4–3.1)

## 2020-01-19 LAB — OCCULT BLOOD X 1 CARD TO LAB, STOOL
Fecal Occult Bld: POSITIVE — AB
Fecal Occult Bld: POSITIVE — AB

## 2020-01-19 MED ORDER — CLONAZEPAM 0.5 MG PO TABS: 0.2500 mg | ORAL_TABLET | Freq: Two times a day (BID) | ORAL | Status: DC | PRN

## 2020-01-19 MED ORDER — CLONAZEPAM 0.125 MG PO TBDP
0.2500 mg | ORAL_TABLET | Freq: Two times a day (BID) | ORAL | Status: DC | PRN
  Administered 2020-01-19 – 2020-01-21 (×5): 0.25 mg via ORAL
  Filled 2020-01-19 (×5): qty 2

## 2020-01-19 MED ORDER — HYDROCORTISONE NA SUCCINATE PF 100 MG IJ SOLR: 50.0000 mg | Freq: Two times a day (BID) | INTRAMUSCULAR | Status: DC

## 2020-01-19 MED ORDER — CLONAZEPAM 0.1 MG/ML ORAL SUSPENSION
0.2500 mg | Freq: Two times a day (BID) | ORAL | Status: DC | PRN
  Filled 2020-01-19: qty 2.5

## 2020-01-19 MED ORDER — HYDROCORTISONE NA SUCCINATE PF 100 MG IJ SOLR
50.0000 mg | Freq: Two times a day (BID) | INTRAMUSCULAR | Status: AC
  Administered 2020-01-19 – 2020-01-20 (×2): 50 mg via INTRAVENOUS
  Filled 2020-01-19 (×2): qty 2

## 2020-01-19 MED ORDER — FUROSEMIDE 10 MG/ML IJ SOLN
40.0000 mg | Freq: Once | INTRAMUSCULAR | Status: AC
  Administered 2020-01-19: 40 mg via INTRAVENOUS
  Filled 2020-01-19: qty 4

## 2020-01-19 NOTE — Plan of Care (Signed)

## 2020-01-19 NOTE — Plan of Care (Signed)
  Problem: Education: Goal: Knowledge of General Education information will improve Description: Including pain rating scale, medication(s)/side effects and non-pharmacologic comfort measures 01/19/2020 2353 by Keturah Shavers, RN Outcome: Progressing 01/19/2020 2352 by Keturah Shavers, RN Outcome: Progressing   Problem: Clinical Measurements: Goal: Ability to maintain clinical measurements within normal limits will improve 01/19/2020 2353 by Keturah Shavers, RN Outcome: Progressing 01/19/2020 2352 by Keturah Shavers, RN Outcome: Progressing Goal: Will remain free from infection 01/19/2020 2353 by Keturah Shavers, RN Outcome: Progressing 01/19/2020 2352 by Keturah Shavers, RN Outcome: Progressing Goal: Diagnostic test results will improve 01/19/2020 2353 by Keturah Shavers, RN Outcome: Progressing 01/19/2020 2352 by Keturah Shavers, RN Outcome: Progressing Goal: Respiratory complications will improve 01/19/2020 2353 by Keturah Shavers, RN Outcome: Progressing 01/19/2020 2352 by Keturah Shavers, RN Outcome: Progressing Goal: Cardiovascular complication will be avoided 01/19/2020 2353 by Keturah Shavers, RN Outcome: Progressing 01/19/2020 2352 by Keturah Shavers, RN Outcome: Progressing   Problem: Nutrition: Goal: Adequate nutrition will be maintained 01/19/2020 2353 by Keturah Shavers, RN Outcome: Progressing 01/19/2020 2352 by Keturah Shavers, RN Outcome: Progressing   Problem: Coping: Goal: Level of anxiety will decrease 01/19/2020 2353 by Keturah Shavers, RN Outcome: Progressing 01/19/2020 2352 by Keturah Shavers, RN Outcome: Progressing   Problem: Elimination: Goal: Will not experience complications related to bowel motility 01/19/2020 2353 by Keturah Shavers, RN Outcome: Progressing 01/19/2020 2352 by Keturah Shavers, RN Outcome: Progressing Goal: Will not experience complications related to urinary retention 01/19/2020 2353 by Keturah Shavers, RN Outcome:  Progressing 01/19/2020 2352 by Keturah Shavers, RN Outcome: Progressing   Problem: Pain Managment: Goal: General experience of comfort will improve 01/19/2020 2353 by Keturah Shavers, RN Outcome: Progressing 01/19/2020 2352 by Keturah Shavers, RN Outcome: Progressing

## 2020-01-19 NOTE — Progress Notes (Signed)
Gastroenterology Inpatient Follow-up Note   PATIENT IDENTIFICATION  Sara Pierce is a 79 y.o. female with a pmh significant for CHF, chronic renal insufficiency COPD, anxiety/depression, diabetes, hypertension, sleep apnea who was admitted with respiratory failure and C. difficile colitis. Hospital Day: 4  SUBJECTIVE  No significant events from yesterday into today though still has reported mucousy blood in less quantity than when she came in. Hemoglobin noted based on packed RBCs. No fevers or chills. Abdominal pain still present. Wheezing still present.   OBJECTIVE  Scheduled Inpatient Medications:  . buPROPion  100 mg Oral BID  . FLUoxetine  60 mg Oral Daily  . gabapentin  200 mg Oral BID  . guaiFENesin  600 mg Oral BID  . hydrocortisone sod succinate (SOLU-CORTEF) inj  50 mg Intravenous Q12H  . ipratropium-albuterol  3 mL Nebulization TID  . mouth rinse  15 mL Mouth Rinse BID  . mirtazapine  7.5 mg Oral QHS  . montelukast  10 mg Oral QHS  . predniSONE  10 mg Oral Q breakfast  . primidone  50 mg Oral QHS  . simvastatin  40 mg Oral QHS  . vancomycin  500 mg Oral Q6H   Continuous Inpatient Infusions:  . metronidazole 500 mg (01/19/20 2140)   PRN Inpatient Medications: bisacodyl, clonazepam, ondansetron **OR** ondansetron (ZOFRAN) IV, polyethylene glycol   Physical Examination  Temp:  [97.9 F (36.6 C)-98.4 F (36.9 C)] 98.2 F (36.8 C) (02/06 2231) Pulse Rate:  [59-77] 71 (02/06 2231) Resp:  [16-23] 23 (02/06 2231) BP: (103-119)/(48-56) 119/56 (02/06 2231) SpO2:  [95 %-99 %] 97 % (02/06 1949) Weight:  [118.2 kg] 118.2 kg (02/06 0500) Temp (24hrs), Avg:98.2 F (36.8 C), Min:97.9 F (36.6 C), Max:98.4 F (36.9 C)  Weight: 118.2 kg GEN: Elderly, resting in bed  PSYCH: Cooperative EYE: Conjunctivae pink, sclerae anicteric CV: Nontachycardic RESP: Audible wheezing present GI: NABS, soft, mild tenderness to palpation deeply, no rebound  MSK/EXT: Trace lower  extremity edema SKIN: No jaundice NEURO: No focal deficits   Review of Data   Laboratory Studies   Recent Labs  Lab 01/17/20 0047 01/17/20 0641 01/19/20 0442  NA 133*   < > 138  K 4.5   < > 4.3  CL 102   < > 101  CO2 22   < > 27  BUN 85*   < > 58*  CREATININE 2.12*   < > 0.93  GLUCOSE 83   < > 142*  CALCIUM 5.9*   < > 8.1*  MG 1.8  --  2.4  PHOS 5.3*  --   --    < > = values in this interval not displayed.   Recent Labs  Lab 01/19/20 0442  AST 18  ALT 11  ALKPHOS 45    Recent Labs  Lab 01/17/20 0641 01/17/20 0641 01/18/20 0155 01/18/20 0402 01/19/20 0442  WBC 20.0*   < > 11.8*   < > 9.7  HGB 7.8*   < > 6.6*   < > 9.0*  HCT 25.4*   < > 21.1*   < > 28.0*  PLT 184  --  190  --  190   < > = values in this interval not displayed.   No results for input(s): APTT, INR in the last 168 hours.   Imaging Studies  No new studies to review  GI Procedures and Studies  No relevant studies to review   ASSESSMENT  Ms. Sara Pierce is a 79 y.o. female with  a pmh significant for CHF, chronic renal insufficiency COPD, anxiety/depression, diabetes, hypertension, sleep apnea who was admitted with respiratory failure and C. difficile colitis.  From a GI perspective the patient is stable.  I suspect her bleeding as Dr. Benson Pierce did is a result of her C. difficile colitis.  High risk for endoscopic intervention if the patient has severe C. difficile since that can increase risk for perforation when endoscopic evaluations performed.  If the patient has significant hemodynamic compromise then patient needs to be admitted to the ICU and then we can consider a flexible sigmoidoscopy.  Otherwise, mucosal healing mucosal inflammation will lead to bleeding at times especially in patients were on dual antiplatelet/anticoagulation therapy.  We will monitor her hemoglobin into tomorrow but if she is otherwise stable we will likely sign off and continue her hemoglobin trend while she is in the hospital  being treated for C. difficile.   PLAN/RECOMMENDATIONS  Trend hemoglobin hematocrit Continue C. difficile treatment as you are with p.o. vancomycin/Flagyl for at least 14-day treatment and if she is doing well may be able to transition IV Flagyl to oral Flagyl and complete vancomycin Holding on endoscopic evaluation due to high risk nature unless she is having overt hemodynamically unstable bleeding at which point she needs to go to the ICU and further discussion as to role of angiography first flexible sigmoidoscopy   Please page/call with questions or concerns.   Sara Britain, MD [facility] Office # [phone]    LOS: 3 days  Sara Pierce  01/19/2020, 10:37 PM

## 2020-01-19 NOTE — Progress Notes (Signed)
  Speech Language Pathology Treatment: Dysphagia  Patient Details Name: Sara Pierce MRN: [id number] DOB: 1940/12/17 Today's Date: 01/19/2020 Time: 2542-7062 SLP Time Calculation (min) (ACUTE ONLY): 13 min  Assessment / Plan / Recommendation Clinical Impression  Pt seen for dysphagia therapy with baseline wheezing. Frequent eructation following solids and liquids- no overt s/s aspiration. Suspect symptoms are esophageal related versus overt pharyngeal dysphagia. CXR negative for acute abnormalities. SLP educated pt on esophageal precautions with pt verbalizing comprehension. Hx COPD does increase risk of aspiration however MBS is not warranted at this time. She affirms difficulty masticating meat and agreeable to downgrade to Dys 3 (chopped meats). Will discharge ST.   HPI HPI: Sara Pierce, 78y/f, presented to ED with vomiting and abdominal pain, likely related to Colitis. PMH of COPD (home O2 3L), CHF, CAD, A-fib, hyperlipidemia, DM type 2, and hypothyroidism. No history of dsyphagia per chart. MD noted wheezing and congestion this morning on examination.       SLP Plan  All goals met;Discharge SLP treatment due to (comment)       Recommendations  Diet recommendations: Dysphagia 3 (mechanical soft);Thin liquid Liquids provided via: Cup;Straw Medication Administration: Whole meds with liquid Supervision: Patient able to self feed Compensations: Slow rate;Small sips/bites;Follow solids with liquid Postural Changes and/or Swallow Maneuvers: Seated upright 90 degrees;Upright 30-60 min after meal                Oral Care Recommendations: Oral care BID Follow up Recommendations: None SLP Visit Diagnosis: Dysphagia, unspecified (R13.10) Plan: All goals met;Discharge SLP treatment due to (comment)       GO                [place] Siren 01/19/2020, 9:55 AM  Orbie Pyo Colvin Caroli.Ed Risk analyst [phone] Office [phone]

## 2020-01-19 NOTE — Progress Notes (Signed)
Patient consistently coughing, spitting up clear mucus. Scheduled nebulizer administered to patient. Reaching [phone] on IS. MD made aware- received order for Lasix, strict I&O, and daily weights. Will continue to monitor.

## 2020-01-19 NOTE — Progress Notes (Addendum)
PROGRESS NOTE  Sara Pierce DGU:[id number] DOB: Jul 11, 1941 DOA: 01/16/2020 PCP: Marygrace Drought, MD  Brief summary: Per ED triage note at 3pm on 2/3"Pt from [facility] via EMS . Pt A/O with 100% with 8 liters via non re breather . EMS reported to during ADLs the staff thought the Pt died and staff preformed CPR.  Pt reported to EMS that last night she vometed and has been [facility] since then ." Patient reports cpr aborted by the patient herself.  Per patient she vomited the night prior to coming to the hospital, has been more short of breath since vomited, she also reports ab pain and diarrhea  on presentation. Per EDP oxygen requirement has improved since arrival to the ED.   HPI/Recap of past 24 hours:  hgb improved after 2prbc transfusion yesterday on 2/5 Stool is still mucosy and bloody but in very small quantity She denies ab pain, no n/v, no fever She is sitting up in chair    Wbc and cr improved She  Sounds less  congested and less wheezy, still has intermittent cough but seems improved as well, she does not appear to be in respiratory distress  She is not a reliable historian  Assessment/Plan: Active Problems:   Colitis  cdiff colitis Severe with significant leukocytosis and cr elevation on presentation Continue  oval vanc,  iv flagyl Appear improving, diarrhea slowed down, wbc and cr  improved  Hematochezia with acute blood loss anemia -Had a large bloody mucousy discharge per rectum on 2/5 pm with brief drop of blood pressure , no ab pain -home meds plavix and eliquis discontinued -s/p 2prbc transfusion on 2/5 -GI Dr Benson Norway consulted on 2/5  "If necessary, an FFS without sedation can be performed." -improved, still has bloody discharge per rectum but in very small quantity today, hgb has improved -monitor  AKI on CKDII  Likely from cdiff infection, no obstruction on CT ab Renal dosing meds Cr much improved today   Hyperkalemia: K5.9 on presentation,    normalized   Acute on chronic hypoxic respiratory failure, h/o copd home o2 (3liter) and prednisone dependent/chronic immunosuppressed status -she arrived to the ED "with 100% with 8 liters via non re breather " by EMS -cxr on presentation :No acute disease.  Left basilar atelectasis noted +wheezing on exam ? Upper airway sounds, but no hypoxia, not in respiratory distress since admitted to the hospital -schedule nebs, add on mucinex, continue prednisone  -Speech eval Diet recommendations: Dysphagia 3 (mechanical soft);Thin liquid Liquids provided via: Cup;Straw Medication Administration: Whole meds with liquid Supervision: Patient able to self feed Compensations: Slow rate;Small sips/bites;Follow solids with liquid Postural Changes and/or Swallow Maneuvers: Seated upright 90 degrees;Upright 30-60 min after meal  Chronic combined chf lvef slightly reduced at 50%  home meds metolazone/demadex held since admission due to [facility] Close monitor volume status, no edema, cxr no acute findings,  But start to cough whitish mucus, will give iv lasix now that aki has resolved Plan to resume home diuretic regiment tomorrow   Paroxysmal atrial fibrillation Coronary artery disease Denies chest pain, currently in sinus rhythm -Continue home meds Coreg.  Patient has aspirin allergy. Hold eliquis and plavix  due to drop of hgb and hematochezia    Morbid obesity: Body mass index is 43.36 kg/m.    Per HPI patient " recently on 12/12/2019 when she was hospitalized for Covid pneumonia.  She was discharged back to the facility on 12/25/2019, and has reportedly been treated for pneumonia  and volume overload since then"  "Of note, patient met with palliative care on 01/08/2020 who initiated goals of care discussions, as the patient has been considering going home with home hospice.  Discussed her goals of care at the time of admission, patient states she would like to proceed with care at this time,  though would like to update her CODE STATUS to DNR/limited and potentially go home with home hospice pending further discussions and progression of her symptoms/clinical status overall."  Pressure Injury 01/17/20 Thigh Posterior;Proximal;Right Stage 2 -  Partial thickness loss of dermis presenting as a shallow open injury with a red, pink wound bed without slough. stage 2 pressure injury (Active)  01/17/20 0145  Location: Thigh  Location Orientation: Posterior;Proximal;Right  Staging: Stage 2 -  Partial thickness loss of dermis presenting as a shallow open injury with a red, pink wound bed without slough.  Wound Description (Comments): stage 2 pressure injury  Present on Admission: Yes     Pressure Injury 01/17/20 Buttocks Left Stage 2 -  Partial thickness loss of dermis presenting as a shallow open injury with a red, pink wound bed without slough. stage 2 pressure ulcer (Active)  01/17/20 0146  Location: Buttocks  Location Orientation: Left  Staging: Stage 2 -  Partial thickness loss of dermis presenting as a shallow open injury with a red, pink wound bed without slough.  Wound Description (Comments): stage 2 pressure ulcer  Present on Admission: Yes        DVT Prophylaxis: scd  Code Status: DNR  Family Communication: patient   Disposition Plan: Per Education officer, museum,  "At this time pt requires total care; per notes pt was not being cared for at home and made the decision that she wanted to go to SNF for long term"  Patient give permission to talk to her husband   Consultants:  Palliative care  Speech therapy  Social worker  Procedures:  none  Antibiotics:  Oral vanc and iv flagyl   Objective: BP (!) 103/52 (BP Location: Right Arm)   Pulse (!) 59   Temp 97.9 F (36.6 C) (Oral)   Resp 16   Ht '5\' 5"'$  (1.651 m)   Wt 118.2 kg   SpO2 98%   BMI 43.36 kg/m   Intake/Output Summary (Last 24 hours) at 01/19/2020 0746 Last data filed at 01/19/2020 0300 Gross per 24 hour   Intake 2168 ml  Output 700 ml  Net 1468 ml   Filed Weights   01/17/20 0114 01/18/20 0645 01/19/20 0500  Weight: 115.2 kg 117.4 kg 118.2 kg    Exam: Patient is examined daily including today on 01/19/2020, exams remain the same as of yesterday except that has changed    General:  Frail, chronically ill, NAD  Cardiovascular: RRR  Respiratory: diminished but no wheezing today, no rales, no rhonchi  Abdomen: Soft/ND/NT, positive BS  Musculoskeletal: No Edema, bilateral feet chronically  inversly rotated ( reports has not been able to stand up for two yrs), no edema appreciated  Neuro: alert, oriented to the months, know she is at [facility], reports it is year 2001, 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   Skin: see pic below      Data Reviewed: Basic Metabolic Panel: Recent Labs  Lab 01/17/20 0047 01/17/20 0641 01/17/20 1250 01/18/20 0155 01/19/20 0442  NA 133* 131* 131* 132* 138  K 4.5 5.7* 5.4* 4.2 4.3  CL 102 93* 92* 95* 101  CO2 '22 26 27 26 '$ 27  GLUCOSE 83 89 114* 98 142*  BUN 85* 104* 101* 100* 58*  CREATININE 2.12* 2.63* 2.31* 2.08* 0.93  CALCIUM 5.9* 7.4* 7.3* 7.5* 8.1*  MG 1.8  --   --   --  2.4  PHOS 5.3*  --   --   --   --    Liver Function Tests: Recent Labs  Lab 01/16/20 1540 01/17/20 0641 01/18/20 0155 01/19/20 0442  AST 15 20 10* 18  ALT '11 12 9 11  '$ ALKPHOS 66 71 49 45  BILITOT 0.7 1.0 0.9 0.9  PROT 5.4* 5.8* 4.8* 5.3*  ALBUMIN 2.6* 2.6* 2.3* 2.5*   Recent Labs  Lab 01/16/20 1540  LIPASE 17   No results for input(s): AMMONIA in the last 168 hours. CBC: Recent Labs  Lab 01/16/20 1540 01/17/20 0144 01/17/20 0641 01/17/20 0641 01/18/20 0155 01/18/20 0402 01/18/20 1217 01/18/20 2132 01/19/20 0442  WBC 29.8*  --  20.0*  --  11.8*  --   --   --  9.7  NEUTROABS 29.2*  --  17.7*  --  9.8*  --   --   --  8.6*  HGB 7.8*  --  7.8*   < > 6.6* 6.7* 8.2* 9.3* 9.0*  HCT 26.1*   < > 25.4*   < > 21.1* 21.4* 26.1* 29.1* 28.0*  MCV 102.4*   --  101.6*  --  97.7  --   --   --  96.6  PLT 214  --  184  --  190  --   --   --  190   < > = values in this interval not displayed.   Cardiac Enzymes:   No results for input(s): CKTOTAL, CKMB, CKMBINDEX, TROPONINI in the last 168 hours. BNP (last 3 results) Recent Labs    01/17/20 0144  BNP 259.7*    ProBNP (last 3 results) No results for input(s): PROBNP in the last 8760 hours.  CBG: No results for input(s): GLUCAP in the last 168 hours.  Recent Results (from the past 240 hour(s))  SARS CORONAVIRUS 2 (TAT 6-24 HRS) Nasopharyngeal Nasopharyngeal Swab     Status: None   Collection Time: 01/16/20 11:30 PM   Specimen: Nasopharyngeal Swab  Result Value Ref Range Status   SARS Coronavirus 2 NEGATIVE NEGATIVE Final    Comment: (NOTE) SARS-CoV-2 target nucleic acids are NOT DETECTED. The SARS-CoV-2 RNA is generally detectable in upper and lower respiratory specimens during the acute phase of infection. Negative results do not preclude SARS-CoV-2 infection, do not rule out co-infections with other pathogens, and should not be used as the sole basis for treatment or other patient management decisions. Negative results must be combined with clinical observations, patient history, and epidemiological information. The expected result is Negative. Fact Sheet for Patients: SugarRoll.be Fact Sheet for Healthcare Providers: https://www.woods-mathews.com/ This test is not yet approved or cleared by the [place] FDA and  has been authorized for detection and/or diagnosis of SARS-CoV-2 by FDA under an Emergency Use Authorization (EUA). This EUA will remain  in effect (meaning this test can be used) for the duration of the COVID-19 declaration under Section 56 4(b)(1) of the Act, 21 U.S.C. section 360bbb-3(b)(1), unless the authorization is terminated or revoked sooner. Performed at [facility], [place] [address] [place],  [place] [address]   Culture, blood (routine x 2)     Status: None (Preliminary result)   Collection Time: 01/16/20 11:30 PM   Specimen: BLOOD  Result Value Ref  Range Status   Specimen Description BLOOD LEFT ANTECUBITAL  Final   Special Requests   Final    BOTTLES DRAWN AEROBIC AND ANAEROBIC Blood Culture adequate volume   Culture   Final    NO GROWTH 2 DAYS Performed at [facility], [address], [place], [place] [address]    Report Status PENDING  Incomplete  Culture, blood (routine x 2)     Status: None (Preliminary result)   Collection Time: 01/16/20 11:30 PM   Specimen: BLOOD RIGHT FOREARM  Result Value Ref Range Status   Specimen Description BLOOD RIGHT FOREARM  Final   Special Requests   Final    BOTTLES DRAWN AEROBIC AND ANAEROBIC Blood Culture adequate volume   Culture   Final    NO GROWTH 2 DAYS Performed at [facility], [place] [address] [place], [place] [address]    Report Status PENDING  Incomplete  C difficile quick scan w PCR reflex     Status: Abnormal   Collection Time: 01/17/20  3:05 AM   Specimen: STOOL  Result Value Ref Range Status   C Diff antigen POSITIVE (A) NEGATIVE Final   C Diff toxin NEGATIVE NEGATIVE Final   C Diff interpretation Results are indeterminate. See PCR results.  Final    Comment: Performed at [facility], [place] [address], [place], [place] [address]  C. Diff by PCR, Reflexed     Status: Abnormal   Collection Time: 01/17/20  3:05 AM  Result Value Ref Range Status   Toxigenic C. Difficile by PCR POSITIVE (A) NEGATIVE Final    Comment: Positive for toxigenic C. difficile with little to no toxin production. Only treat if clinical presentation suggests symptomatic illness. Performed at [facility], [place] [address] [place], [place] [address]   MRSA PCR Screening     Status: None   Collection Time: 01/17/20  4:32 PM   Specimen: Nasal Mucosa; Nasopharyngeal  Result Value Ref Range Status   MRSA by PCR NEGATIVE NEGATIVE  Final    Comment:        The GeneXpert MRSA Assay (FDA approved for NASAL specimens only), is one component of a comprehensive MRSA colonization surveillance program. It is not intended to diagnose MRSA infection nor to guide or monitor treatment for MRSA infections. Performed at [facility], [place] [address], [place], [place] [address]   Expectorated sputum assessment w rflx to resp cult     Status: None   Collection Time: 01/18/20  3:53 PM   Specimen: Expectorated Sputum  Result Value Ref Range Status   Specimen Description EXPECTORATED SPUTUM  Final   Special Requests NONE  Final   Sputum evaluation   Final    THIS SPECIMEN IS ACCEPTABLE FOR SPUTUM CULTURE Performed at [facility], [place] [address] [place],  [address]    Report Status 01/18/2020 FINAL  Final  Culture, respiratory     Status: None (Preliminary result)   Collection Time: 01/18/20  3:53 PM  Result Value Ref Range Status   Specimen Description EXPECTORATED SPUTUM  Final   Special Requests NONE Reflexed from [id number]  Final   Gram Stain   Final    RARE WBC PRESENT, PREDOMINANTLY PMN FEW SQUAMOUS EPITHELIAL CELLS PRESENT FEW YEAST RARE GRAM POSITIVE RODS Performed at [facility], [place] [address], [place],  [address]    Culture PENDING  Incomplete   Report Status PENDING  Incomplete  Studies: No results found.  Scheduled Meds: . buPROPion  100 mg Oral BID  . FLUoxetine  60 mg Oral Daily  . gabapentin  200 mg Oral BID  . guaiFENesin  600 mg Oral BID  . hydrocortisone sod succinate (SOLU-CORTEF) inj  50 mg Intravenous Q12H  . ipratropium-albuterol  3 mL Nebulization TID  . mouth rinse  15 mL Mouth Rinse BID  . mirtazapine  7.5 mg Oral QHS  . montelukast  10 mg Oral QHS  . predniSONE  10 mg Oral Q breakfast  . primidone  50 mg Oral QHS  . simvastatin  40 mg Oral QHS  . vancomycin  500 mg Oral Q6H    Continuous Infusions: . metronidazole 500 mg (01/19/20 0508)      Time spent: 8mns I have personally reviewed and interpreted on  01/19/2020 daily labs,  imagings as discussed above under date review session and assessment and plans.  I reviewed all nursing notes, pharmacy notes,   vitals, pertinent old records  I have discussed plan of care as described above with RN , patient  on 01/19/2020   FFlorencia ReasonsMD, PhD, FACP  [facility]  Available via Epic secure chat 7am-7pm for nonurgent issues Please page for urgent issues, pager number available through [email or web address] .   01/19/2020, 7:46 AM  LOS: 3 days

## 2020-01-20 ENCOUNTER — Inpatient Hospital Stay (HOSPITAL_COMMUNITY): Payer: Medicare Other

## 2020-01-20 DIAGNOSIS — N189 Chronic kidney disease, unspecified: Secondary | ICD-10-CM

## 2020-01-20 DIAGNOSIS — N289 Disorder of kidney and ureter, unspecified: Secondary | ICD-10-CM

## 2020-01-20 DIAGNOSIS — R627 Adult failure to thrive: Secondary | ICD-10-CM

## 2020-01-20 DIAGNOSIS — Z515 Encounter for palliative care: Secondary | ICD-10-CM

## 2020-01-20 DIAGNOSIS — Z66 Do not resuscitate: Secondary | ICD-10-CM

## 2020-01-20 LAB — CBC WITH DIFFERENTIAL/PLATELET
Abs Immature Granulocytes: 0.47 10*3/uL — ABNORMAL HIGH (ref 0.00–0.07)
Basophils Absolute: 0.1 10*3/uL (ref 0.0–0.1)
Basophils Relative: 0 %
Eosinophils Absolute: 0 10*3/uL (ref 0.0–0.5)
Eosinophils Relative: 0 %
HCT: 29.1 % — ABNORMAL LOW (ref 36.0–46.0)
Hemoglobin: 9.2 g/dL — ABNORMAL LOW (ref 12.0–15.0)
Immature Granulocytes: 3 %
Lymphocytes Relative: 9 %
Lymphs Abs: 1.3 10*3/uL (ref 0.7–4.0)
MCH: 31.2 pg (ref 26.0–34.0)
MCHC: 31.6 g/dL (ref 30.0–36.0)
MCV: 98.6 fL (ref 80.0–100.0)
Monocytes Absolute: 1.1 10*3/uL — ABNORMAL HIGH (ref 0.1–1.0)
Monocytes Relative: 7 %
Neutro Abs: 12.2 10*3/uL — ABNORMAL HIGH (ref 1.7–7.7)
Neutrophils Relative %: 81 %
Platelets: 214 10*3/uL (ref 150–400)
RBC: 2.95 MIL/uL — ABNORMAL LOW (ref 3.87–5.11)
RDW: 16.2 % — ABNORMAL HIGH (ref 11.5–15.5)
WBC: 15.2 10*3/uL — ABNORMAL HIGH (ref 4.0–10.5)
nRBC: 0.1 % (ref 0.0–0.2)

## 2020-01-20 LAB — BASIC METABOLIC PANEL
Anion gap: 9 (ref 5–15)
BUN: 35 mg/dL — ABNORMAL HIGH (ref 8–23)
CO2: 29 mmol/L (ref 22–32)
Calcium: 8.4 mg/dL — ABNORMAL LOW (ref 8.9–10.3)
Chloride: 100 mmol/L (ref 98–111)
Creatinine, Ser: 0.75 mg/dL (ref 0.44–1.00)
GFR calc Af Amer: >60 mL/min (ref >60)
GFR calc non Af Amer: >60 mL/min (ref >60)
Glucose, Bld: 120 mg/dL — ABNORMAL HIGH (ref 70–99)
Potassium: 3.4 mmol/L — ABNORMAL LOW (ref 3.5–5.1)
Sodium: 138 mmol/L (ref 135–145)

## 2020-01-20 LAB — MAGNESIUM: Magnesium: 2 mg/dL (ref 1.7–2.4)

## 2020-01-20 MED ORDER — POTASSIUM CHLORIDE CRYS ER 20 MEQ PO TBCR
40.0000 meq | EXTENDED_RELEASE_TABLET | Freq: Once | ORAL | Status: AC
  Administered 2020-01-20: 40 meq via ORAL
  Filled 2020-01-20: qty 2

## 2020-01-20 MED ORDER — VANCOMYCIN 50 MG/ML ORAL SOLUTION
125.0000 mg | Freq: Four times a day (QID) | ORAL | Status: DC
  Administered 2020-01-21 – 2020-01-22 (×8): 125 mg via ORAL
  Filled 2020-01-20 (×11): qty 2.5

## 2020-01-20 MED ORDER — FUROSEMIDE 10 MG/ML IJ SOLN
40.0000 mg | Freq: Two times a day (BID) | INTRAMUSCULAR | Status: DC
  Administered 2020-01-20 – 2020-01-22 (×4): 40 mg via INTRAVENOUS
  Filled 2020-01-20 (×4): qty 4

## 2020-01-20 MED ORDER — METRONIDAZOLE 500 MG PO TABS
500.0000 mg | ORAL_TABLET | Freq: Three times a day (TID) | ORAL | Status: DC
  Administered 2020-01-20 – 2020-01-22 (×6): 500 mg via ORAL
  Filled 2020-01-20 (×6): qty 1

## 2020-01-20 NOTE — Progress Notes (Signed)
     [facility] Progress Note Covering for Dr. Benson Norway   CC:  C. Diff colitis   Subjective: She is sitting up in the chair. No abdominal pain. Her RN reported she continues to pass bloody mucous per the rectum x 2 thus far on day shift, no stool output.   Objective:  Vital signs in last 24 hours: Temp:  [98.1 F (36.7 C)-98.4 F (36.9 C)] 98.1 F (36.7 C) (02/07 0604) Pulse Rate:  [67-77] 67 (02/07 0604) Resp:  [20-23] 22 (02/07 0604) BP: (111-119)/(32-56) 111/32 (02/07 0604) SpO2:  [88 %-97 %] 88 % (02/07 0604) Weight:  [119.8 kg] 119.8 kg (02/07 0600) Last BM Date: 01/19/20 General:  Alert 79 year old female sitting up in the chair in NAD.  Heart: RRR, no murmur.  Pulm:  Coarse inspiratory and expiratory wheezes throughout lung fields. Congested cough.  Abdomen: Obese abdomen, soft, nontender.  Extremities:  LE deformities. Left hand contracture.  Neurologic:  Alert and  oriented x 2. Speech is clear. Responds to questions appropriately. Psych:  Alert and cooperative. Normal mood and affect.  Intake/Output from previous day: 02/06 0701 - 02/07 0700 In: 2080 [P.O.:1680; IV Piggyback:400] Out: 800 [Urine:800] Intake/Output this shift: Total I/O In: 424 [P.O.:424] Out: -   Lab Results: Recent Labs    01/18/20 0155 01/18/20 0402 01/18/20 2132 01/19/20 0442 01/20/20 0336  WBC 11.8*  --   --  9.7 15.2*  HGB 6.6*   < > 9.3* 9.0* 9.2*  HCT 21.1*   < > 29.1* 28.0* 29.1*  PLT 190  --   --  190 214   < > = values in this interval not displayed.   BMET Recent Labs    01/18/20 0155 01/19/20 0442 01/20/20 0336  NA 132* 138 138  K 4.2 4.3 3.4*  CL 95* 101 100  CO2 26 27 29   GLUCOSE 98 142* 120*  BUN 100* 58* 35*  CREATININE 2.08* 0.93 0.75  CALCIUM 7.5* 8.1* 8.4*   LFT Recent Labs    01/19/20 0442  PROT 5.3*  ALBUMIN 2.5*  AST 18  ALT 11  ALKPHOS 45  BILITOT 0.9   PT/INR No results for input(s): LABPROT, INR in the last 72  hours. Hepatitis Panel No results for input(s): HEPBSAG, HCVAB, HEPAIGM, HEPBIGM in the last 72 hours.  No results found.  Assessment / Plan:  1. C. Diff colitis. WBC 15.2 up from 9.7. She is afebrile. She is on Prednisone 10mg  QD. She continues to pass small amounts of red bloody mucous per the rectum.  - Continue Vanc po and Flagyl IV -No plans for endoscopic evaluation at this time -Repeat CBC with diff in am -Monitor temp Q shift  2. Acute on Chronic Anemia. Transfused 2 units of PRBCs on 2/5. Stable Hg 9.2.   3. CAD. CHF. Afib. Plavix and Eliquis on hold.  4. COPD with acute respiratory failure.   -Recommend repeat chest xray in setting of leukocytosis, defer to the hospitalist.  5. Hypokalemia. K+ 3.4 -Defer KCl replacement to hospitalist   4. + COVID end of Dec. 2020 -Jan. 2021.   Further recommendations per Dr. Rush Landmark.  Dr. Benson Norway to resume service tomorrow   Active Problems:   Colitis     LOS: 4 days   Sara Pierce  01/20/2020, 1:40 PM

## 2020-01-20 NOTE — Consult Note (Signed)
Consultation Note Date: 01/20/2020   Patient Name: Sara Pierce  DOB: 10/22/1941  MRN: [id number]  Age / Sex: 79 y.o., female  PCP: Marygrace Drought, MD Referring Physician: Florencia Reasons, MD  Reason for Consultation: Establishing goals of care and Psychosocial/spiritual support  HPI/Patient Profile: 79 y.o. female   admitted on 01/16/2020 from [facility] skilled nursing facility with a significant past medical history for COPD on 3 L of home oxygen, CHF, CAD, atrial fib, hyperlipidemia, type 2 diabetes, hypothyroidism, she was transported via EMS and admitted through the emergency room. On admission patient reported abdominal pain with loose stool for 3 days along with nausea.  She has been living at the facility for the past 2 years and has had outpatient palliative services through [facility]. She was hospitalized in December 2020 with Covid pneumonia  She was discharged back to the facility on 12/25/2019, and has reportedly been treated for pneumonia and volume overload since then.   Discussed her goals of care at the time of admission, patient states she would like to proceed with care at this time, though would like to update her CODE STATUS to DNR/limited and potentially go home with home hospice pending further discussions and progression of her symptoms/clinical status overall.  Patient faces treatment option decisions, advanced directive decisions and anticipatory care needs.   Clinical Assessment and Goals of Care:  This NP Wadie Lessen reviewed medical records, received report from team, assessed the patient and then meet at the patient's bedside  to discuss diagnosis, prognosis, GOC, EOL wishes disposition and options.  Concept of Hospice and Palliative Care were discussed  Created space and opportunity for patient to explore her thoughts and feelings regarding current medical situation.   She understands her multiple co-morbid ites and likely long term poor prognosis.  "I'm not afraid to die, I'm good with God"      " I just want to be cared for and be comfortable".  She verbalizes that she hopes to go back to [facility], she feels comfortable there and cared for, by the people there.   A detailed discussion was had today regarding advanced directives.  Concepts specific to code status, artifical feeding and hydration, continued IV antibiotics and rehospitalization was had.  The difference between a aggressive medical intervention path  and a palliative comfort care path for this patient at this time was had.  Values and goals of care important to patient and family were attempted to be elicited.   Questions and concerns addressed.   Family encouraged to call with questions or concerns.    PMT will continue to support holistically.    There is no documented healthcare power of attorney. I will attempt to get in touch with the patient's husband, Parminder Peppard. However the patient herself tells me she thinks that her husband has "left her for his first wife"   There is no documented healthcare power of attorney or advanced directive PATIENT currently has capacity to make her own medical decisions.    SUMMARY  OF RECOMMENDATIONS    Code Status/Advance Care Planning:  DNR   Comfort and dignity are the priority/ MOST form completed and in hard chart  Palliative Prophylaxis:   Aspiration, Bowel Regimen, Delirium Protocol, Frequent Pain Assessment and Oral Care  Additional Recommendations (Limitations, Scope, Preferences):  Avoid Hospitalization and No Artificial Feeding  Psycho-social/Spiritual:   Desire for further Chaplaincy support:yes  Additional Recommendations: Education on Hospice  Prognosis:  - less than 6 months   Discharge Planning: [facility] with Hospice      Primary Diagnoses: Present on Admission: . Colitis   I have reviewed  the medical record, interviewed the patient and family, and examined the patient. The following aspects are pertinent.  Past Medical History:  Diagnosis Date  . Arthritis   . Cardiomyopathy ([place])   . Cataract   . Cerebral hemorrhage ([place])   . CHF (congestive heart failure) ([place])   . Chronic kidney disease   . Clotting disorder ([place])   . COPD (chronic obstructive pulmonary disease) ([place])   . Depression   . Diabetes mellitus without complication ([place])   . Hypertension   . Mixed incontinence   . Obesity   . Sleep apnea   . Thyroid disease   . Urinary frequency   . Vaginal atrophy   . Varicose veins    Social History   Socioeconomic History  . Marital status: Married    Spouse name: Not on file  . Number of children: Not on file  . Years of education: Not on file  . Highest education level: Not on file  Occupational History  . Occupation: retired  Tobacco Use  . Smoking status: Former Smoker    Packs/day: 0.50    Years: 44.00    Pack years: 22.00    Types: Cigarettes  . Smokeless tobacco: Never Used  Substance and Sexual Activity  . Alcohol use: No  . Drug use: No  . Sexual activity: Never  Other Topics Concern  . Not on file  Social History Narrative  . Not on file   Social Determinants of Health   Financial Resource Strain:   . Difficulty of Paying Living Expenses: Not on file  Food Insecurity:   . Worried About Charity fundraiser in the Last Year: Not on file  . Ran Out of Food in the Last Year: Not on file  Transportation Needs:   . Lack of Transportation (Medical): Not on file  . Lack of Transportation (Non-Medical): Not on file  Physical Activity:   . Days of Exercise per Week: Not on file  . Minutes of Exercise per Session: Not on file  Stress:   . Feeling of Stress : Not on file  Social Connections:   . Frequency of Communication with Friends and Family: Not on file  . Frequency of Social Gatherings with Friends and Family: Not on file  .  Attends Religious Services: Not on file  . Active Member of Clubs or Organizations: Not on file  . Attends Archivist Meetings: Not on file  . Marital Status: Not on file   Family History  Problem Relation Age of Onset  . Heart attack Father   . Coronary artery disease Father   . Hypertension Father   . Hyperlipidemia Father   . Heart attack Mother   . Hypertension Mother   . Hyperlipidemia Mother   . Breast cancer Neg Hx    Scheduled Meds: . buPROPion  100 mg Oral BID  .  FLUoxetine  60 mg Oral Daily  . gabapentin  200 mg Oral BID  . guaiFENesin  600 mg Oral BID  . ipratropium-albuterol  3 mL Nebulization TID  . mouth rinse  15 mL Mouth Rinse BID  . mirtazapine  7.5 mg Oral QHS  . montelukast  10 mg Oral QHS  . predniSONE  10 mg Oral Q breakfast  . primidone  50 mg Oral QHS  . simvastatin  40 mg Oral QHS  . vancomycin  500 mg Oral Q6H   Continuous Infusions: . metronidazole 500 mg (01/20/20 0531)   PRN Meds:.bisacodyl, clonazepam, ondansetron **OR** ondansetron (ZOFRAN) IV, polyethylene glycol Medications Prior to Admission:  Prior to Admission medications   Medication Sig Start Date End Date Taking? Authorizing Provider  acetaminophen (TYLENOL) 325 MG tablet Take 2 tablets (650 mg total) by mouth every 6 (six) hours as needed for mild pain (or Fever >/= 101). 04/22/18  Yes Gouru, Aruna, MD  albuterol (VENTOLIN HFA) 108 (90 Base) MCG/ACT inhaler Inhale 2 puffs into the lungs every 6 (six) hours as needed for wheezing or shortness of breath.   Yes [provider]  apixaban (ELIQUIS) 2.5 MG TABS tablet Take 2.5 mg by mouth 2 (two) times daily.   Yes [provider]  B Complex-C (B-COMPLEX WITH VITAMIN C) tablet Take 1 tablet by mouth daily.   Yes [provider]  buPROPion (WELLBUTRIN SR) 100 MG 12 hr tablet Take 100 mg by mouth daily.   Yes [provider]  carvedilol (COREG) 3.125 MG tablet Take 3.125 mg by mouth at bedtime.   Yes  [provider]  clopidogrel (PLAVIX) 75 MG tablet Take 1 tablet (75 mg total) by mouth daily. Patient taking differently: Take 75 mg by mouth daily at 6 PM.  04/23/18  Yes Gouru, Aruna, MD  FLUoxetine (PROZAC) 20 MG capsule Take 20 mg by mouth daily. Take with Fluoxetine 40 mg to equal 60 mg   Yes [provider]  FLUoxetine (PROZAC) 40 MG capsule Take 40 mg by mouth daily. Take with Fluoxetine 20 mg to equal 60 mg   Yes [provider]  fluticasone (FLONASE) 50 MCG/ACT nasal spray Place 2 sprays into both nostrils 2 (two) times daily as needed for allergies.    Yes [provider]  gabapentin (NEURONTIN) 100 MG capsule Take 2 capsules (200 mg total) by mouth 3 (three) times daily. 04/22/18  Yes Gouru, Illene Silver, MD  guaiFENesin (MUCINEX) 600 MG 12 hr tablet Take 1 tablet (600 mg total) by mouth 2 (two) times daily. 04/22/18  Yes Gouru, Aruna, MD  Ipratropium-Albuterol (COMBIVENT RESPIMAT) 20-100 MCG/ACT AERS respimat Inhale 1 puff into the lungs 3 (three) times daily.   Yes [provider]  ipratropium-albuterol (DUONEB) 0.5-2.5 (3) MG/3ML SOLN Take 3 mLs by nebulization 4 (four) times daily. Dx:J44.9 Patient taking differently: Take 3 mLs by nebulization every 12 (twelve) hours as needed (for cough and wheezing). Dx:J44.9 07/27/18  Yes Laverle Hobby, MD  magnesium oxide (MAG-OX) 400 MG tablet Take 400 mg by mouth daily.   Yes [provider]  Melatonin 3 MG TABS Take 3 mg by mouth at bedtime.   Yes [provider]  metolazone (ZAROXOLYN) 2.5 MG tablet Take 2.5 mg by mouth daily.   Yes [provider]  mirtazapine (REMERON) 15 MG tablet Take 7.5 mg by mouth at bedtime.   Yes [provider]  montelukast (SINGULAIR) 10 MG tablet Take 10 mg by mouth at bedtime.  Yes [provider]  polyethylene glycol (MIRALAX / GLYCOLAX) packet Take 17 g by mouth daily.   Yes [provider]  potassium chloride SA  (KLOR-CON) 20 MEQ tablet Take 20 mEq by mouth daily.   Yes [provider]  predniSONE (DELTASONE) 10 MG tablet Take 10 mg by mouth every evening.   Yes [provider]  primidone (MYSOLINE) 50 MG tablet Take 50 mg by mouth at bedtime.    Yes [provider]  simvastatin (ZOCOR) 40 MG tablet Take 40 mg by mouth at bedtime.    Yes [provider]  spironolactone (ALDACTONE) 25 MG tablet Take 25 mg by mouth daily.   Yes [provider]  torsemide (DEMADEX) 20 MG tablet Take 40 mg by mouth 2 (two) times daily.   Yes [provider]  umeclidinium-vilanterol (ANORO ELLIPTA) 62.5-25 MCG/INH AEPB Inhale 1 puff into the lungs daily.   Yes [provider]  azithromycin (ZITHROMAX) 250 MG tablet Take 1 tablet (250 mg total) by mouth daily. Take 1 tablet 5 days per week (Monday thru Friday). Patient not taking: Reported on 09/16/2018 08/30/18   Laverle Hobby, MD  budesonide (PULMICORT) 0.5 MG/2ML nebulizer solution Take 2 mLs (0.5 mg total) 2 (two) times daily by nebulization. Patient not taking: Reported on 01/16/2020 10/28/17   Demetrios Loll, MD  clonazePAM (KLONOPIN) 1 MG tablet Take 1 tablet (1 mg total) by mouth 2 (two) times daily. Patient not taking: Reported on 09/16/2018 04/22/18   Nicholes Mango, MD  furosemide (LASIX) 20 MG tablet Take 1 tablet (20 mg total) by mouth 2 (two) times daily. Patient not taking: Reported on 01/16/2020 04/22/18   Nicholes Mango, MD  guaiFENesin (MUCINEX) 600 MG 12 hr tablet Take 1 tablet (600 mg total) by mouth 2 (two) times daily. Patient not taking: Reported on 01/16/2020 07/27/18   Laverle Hobby, MD   Allergies  Allergen Reactions  . Aspirin Swelling  . Codeine Itching  . Naprosyn [Naproxen] Other (See Comments)    Per MAR pt allergic  . Other     Elastic Bandages/ Supports Per MAR  . Tape Hives  . Valacyclovir   . Latex Rash   Review of Systems  Constitutional: Positive for fatigue.     Physical Exam Cardiovascular:     Rate and Rhythm: Normal rate.  Musculoskeletal:     Comments: Left hand contracture noted  Skin:    General: Skin is warm and dry.  Neurological:     Mental Status: She is alert and oriented to person, place, and time.     Motor: Weakness present.     Vital Signs: BP (!) 111/32 (BP Location: Right Arm)   Pulse 67   Temp 98.1 F (36.7 C) (Oral)   Resp (!) 22   Ht 5\' 5"  (1.651 m)   Wt 119.8 kg   SpO2 (!) 88%   BMI 43.95 kg/m  Pain Scale: 0-10   Pain Score: Asleep   SpO2: SpO2: (!) 88 % O2 Device:SpO2: (!) 88 % O2 Flow Rate: .O2 Flow Rate (L/min): 2 L/min  IO: Intake/output summary:   Intake/Output Summary (Last 24 hours) at 01/20/2020 0743 Last data filed at 01/20/2020 0531 Gross per 24 hour  Intake 2080 ml  Output 800 ml  Net 1280 ml    LBM: Last BM Date: 01/19/20 Baseline Weight: Weight: 115.2 kg Most recent weight: Weight: 119.8 kg     Palliative Assessment/Data:  30 % at best   Discussed with Dr  Fang  Time In: 0730 Time Out: 0845 Time Total: 75 minutes Greater than 50%  of this time was spent counseling and coordinating care related to the above assessment and plan.  Signed by: Wadie Lessen, NP   Please contact Palliative Medicine Team phone at [phone] for questions and concerns.  For individual provider: See Shea Evans

## 2020-01-20 NOTE — Progress Notes (Addendum)
PROGRESS NOTE  Sara Pierce EXN:[id number] DOB: 29-Jun-1941 DOA: 01/16/2020 PCP: Marygrace Drought, MD  Brief summary: Per ED triage note at 3pm on 2/3"Pt from [facility] via EMS . Pt A/O with 100% with 8 liters via non re breather . EMS reported to during ADLs the staff thought the Pt died and staff preformed CPR.  Pt reported to EMS that last night she vometed and has been [facility] since then ." Patient reports cpr aborted by the patient herself.  Per patient she vomited the night prior to coming to the hospital, has been more short of breath since vomited, she also reports ab pain and diarrhea  on presentation. Per EDP oxygen requirement has improved since arrival to the ED.   HPI/Recap of past 24 hours:  hgb stable, Stool is still mucosy and bloody but in very small quantity She denies ab pain, no n/v, no fever She is sitting up in chair, reports feeling sob Wbc elevated today   Assessment/Plan: Active Problems:   Colitis  Cdiff colitis Severe with significant leukocytosis and cr elevation on presentation Continue  oval vanc ( was on '500mg'$  q6hr, decreased to '125mg'$ q6hr with clinical improvement),  Change flagyl to oral, plan for total of 14days treatment improving,   Hematochezia with acute blood loss anemia -Had a large bloody mucousy discharge per rectum on 2/5 pm with brief drop of blood pressure , no ab pain -home meds plavix and eliquis discontinued -s/p 2prbc transfusion on 2/5, hemoglobin stable after transfusion -GI Dr Benson Norway consulted on 2/5  "If necessary, an FFS without sedation can be performed." -improved, no plan for scope -monitor  AKI on CKDII  Likely from cdiff infection, no obstruction on CT ab Renal dosing meds AKI resolved, creatinine back to baseline  Hyperkalemia: K5.9 on presentation,  Now hypokalemia, replaced K, repeat in a.m.   Acute on chronic hypoxic respiratory failure, h/o copd home o2 (3liter) and prednisone dependent/chronic  immunosuppressed status -she arrived to the ED "with 100% with 8 liters via non re breather " by EMS -cxr on presentation :No acute disease.  Left basilar atelectasis noted +wheezing on exam ? Upper airway sounds, but no hypoxia, not in respiratory distress since admitted to the hospital -schedule nebs, add on mucinex, continue prednisone  -Speech eval Diet recommendations: Dysphagia 3 (mechanical soft);Thin liquid Liquids provided via: Cup;Straw Medication Administration: Whole meds with liquid Supervision: Patient able to self feed Compensations: Slow rate;Small sips/bites;Follow solids with liquid Postural Changes and/or Swallow Maneuvers: Seated upright 90 degrees;Upright 30-60 min after meal  Chronic combined chf lvef slightly reduced at 50%  home meds metolazone/demadex held since admission due to [facility] Reports feeling short of breath today , repeat chest x-ray , give Lasix 40 mg IV twice daily  close monitor volume status, no edema on exam, f/u chest x-ray   Paroxysmal atrial fibrillation Coronary artery disease Denies chest pain, currently in sinus rhythm -Continue home meds Coreg.  Patient has aspirin allergy. Hold eliquis and plavix  due to drop of hgb and hematochezia    Morbid obesity: Body mass index is 43.95 kg/m.    Per HPI patient " recently on 12/12/2019 when she was hospitalized for Covid pneumonia.  She was discharged back to the facility on 12/25/2019, and has reportedly been treated for pneumonia and volume overload since then"  "Of note, patient met with palliative care on 01/08/2020 who initiated goals of care discussions, as the patient has been considering going home with home hospice.  Discussed her goals of care at the time of admission, patient states she would like to proceed with care at this time, though would like to update her CODE STATUS to DNR/limited and potentially go home with home hospice pending further discussions and progression of her  symptoms/clinical status overall."  Pressure Injury 01/17/20 Thigh Posterior;Proximal;Right Stage 2 -  Partial thickness loss of dermis presenting as a shallow open injury with a red, pink wound bed without slough. stage 2 pressure injury (Active)  01/17/20 0145  Location: Thigh  Location Orientation: Posterior;Proximal;Right  Staging: Stage 2 -  Partial thickness loss of dermis presenting as a shallow open injury with a red, pink wound bed without slough.  Wound Description (Comments): stage 2 pressure injury  Present on Admission: Yes     Pressure Injury 01/17/20 Buttocks Left Stage 2 -  Partial thickness loss of dermis presenting as a shallow open injury with a red, pink wound bed without slough. stage 2 pressure ulcer (Active)  01/17/20 0146  Location: Buttocks  Location Orientation: Left  Staging: Stage 2 -  Partial thickness loss of dermis presenting as a shallow open injury with a red, pink wound bed without slough.  Wound Description (Comments): stage 2 pressure ulcer  Present on Admission: Yes        DVT Prophylaxis: scd  Code Status: DNR  Family Communication: patient   Disposition Plan: Return to SNF with hospice on Monday if clinically stable  Consultants:  Palliative care  Speech therapy  Social worker  GI  Procedures:  prbc transfusion x2 on 2/5  Antibiotics:  Oral vanc   flagyl   Objective: BP (!) 111/32 (BP Location: Right Arm)   Pulse 67   Temp 98.1 F (36.7 C) (Oral)   Resp (!) 22   Ht '5\' 5"'$  (1.651 m)   Wt 119.8 kg   SpO2 (!) 88%   BMI 43.95 kg/m   Intake/Output Summary (Last 24 hours) at 01/20/2020 0901 Last data filed at 01/20/2020 0800 Gross per 24 hour  Intake 2320 ml  Output 800 ml  Net 1520 ml   Filed Weights   01/18/20 0645 01/19/20 0500 01/20/20 0600  Weight: 117.4 kg 118.2 kg 119.8 kg    Exam: Patient is examined daily including today on 01/20/2020, exams remain the same as of yesterday except that has changed     General:  Frail, chronically ill, NAD  Cardiovascular: RRR  Respiratory: + bilateral wheezing , not sure if upper airway sounds or true wheezing, no rales, no rhonchi  Abdomen: Soft/ND/NT, positive BS  Musculoskeletal: No Edema, bilateral feet chronically  internally rotated ( reports has not been able to stand up for two yrs), no edema appreciated  Neuro: alert, oriented to the months, know she is at [facility], reports it is year 2001, 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   Skin: see pic below      Data Reviewed: Basic Metabolic Panel: Recent Labs  Lab 01/17/20 0047 01/17/20 0047 01/17/20 0641 01/17/20 1250 01/18/20 0155 01/19/20 0442 01/20/20 0336  NA 133*   < > 131* 131* 132* 138 138  K 4.5   < > 5.7* 5.4* 4.2 4.3 3.4*  CL 102   < > 93* 92* 95* 101 100  CO2 22   < > '26 27 26 27 29  '$ GLUCOSE 83   < > 89 114* 98 142* 120*  BUN 85*   < > 104* 101* 100* 58* 35*  CREATININE 2.12*   < >  2.63* 2.31* 2.08* 0.93 0.75  CALCIUM 5.9*   < > 7.4* 7.3* 7.5* 8.1* 8.4*  MG 1.8  --   --   --   --  2.4 2.0  PHOS 5.3*  --   --   --   --   --   --    < > = values in this interval not displayed.   Liver Function Tests: Recent Labs  Lab 01/16/20 1540 01/17/20 0641 01/18/20 0155 01/19/20 0442  AST 15 20 10* 18  ALT '11 12 9 11  '$ ALKPHOS 66 71 49 45  BILITOT 0.7 1.0 0.9 0.9  PROT 5.4* 5.8* 4.8* 5.3*  ALBUMIN 2.6* 2.6* 2.3* 2.5*   Recent Labs  Lab 01/16/20 1540  LIPASE 17   No results for input(s): AMMONIA in the last 168 hours. CBC: Recent Labs  Lab 01/16/20 1540 01/17/20 0144 01/17/20 0254 01/17/20 2706 01/18/20 0155 01/18/20 0155 01/18/20 0402 01/18/20 1217 01/18/20 2132 01/19/20 0442 01/20/20 0336  WBC 29.8*  --  20.0*  --  11.8*  --   --   --   --  9.7 15.2*  NEUTROABS 29.2*  --  17.7*  --  9.8*  --   --   --   --  8.6* 12.2*  HGB 7.8*  --  7.8*   < > 6.6*   < > 6.7* 8.2* 9.3* 9.0* 9.2*  HCT 26.1*   < > 25.4*   < > 21.1*   < > 21.4* 26.1* 29.1* 28.0*  29.1*  MCV 102.4*  --  101.6*  --  97.7  --   --   --   --  96.6 98.6  PLT 214  --  184  --  190  --   --   --   --  190 214   < > = values in this interval not displayed.   Cardiac Enzymes:   No results for input(s): CKTOTAL, CKMB, CKMBINDEX, TROPONINI in the last 168 hours. BNP (last 3 results) Recent Labs    01/17/20 0144  BNP 259.7*    ProBNP (last 3 results) No results for input(s): PROBNP in the last 8760 hours.  CBG: No results for input(s): GLUCAP in the last 168 hours.  Recent Results (from the past 240 hour(s))  SARS CORONAVIRUS 2 (TAT 6-24 HRS) Nasopharyngeal Nasopharyngeal Swab     Status: None   Collection Time: 01/16/20 11:30 PM   Specimen: Nasopharyngeal Swab  Result Value Ref Range Status   SARS Coronavirus 2 NEGATIVE NEGATIVE Final    Comment: (NOTE) SARS-CoV-2 target nucleic acids are NOT DETECTED. The SARS-CoV-2 RNA is generally detectable in upper and lower respiratory specimens during the acute phase of infection. Negative results do not preclude SARS-CoV-2 infection, do not rule out co-infections with other pathogens, and should not be used as the sole basis for treatment or other patient management decisions. Negative results must be combined with clinical observations, patient history, and epidemiological information. The expected result is Negative. Fact Sheet for Patients: SugarRoll.be Fact Sheet for Healthcare Providers: https://www.woods-mathews.com/ This test is not yet approved or cleared by the [place] FDA and  has been authorized for detection and/or diagnosis of SARS-CoV-2 by FDA under an Emergency Use Authorization (EUA). This EUA will remain  in effect (meaning this test can be used) for the duration of the COVID-19 declaration under Section 56 4(b)(1) of the Act, 21 U.S.C. section 360bbb-3(b)(1), unless the authorization is terminated or revoked sooner. Performed at [facility]  [place], [place] [address] [place], [place] [address]   Culture, blood (routine x 2)     Status: None (Preliminary result)   Collection Time: 01/16/20 11:30 PM   Specimen: BLOOD  Result Value Ref Range Status   Specimen Description BLOOD LEFT ANTECUBITAL  Final   Special Requests   Final    BOTTLES DRAWN AEROBIC AND ANAEROBIC Blood Culture adequate volume   Culture   Final    NO GROWTH 3 DAYS Performed at [facility], [place] [address] [place], [place] [address]    Report Status PENDING  Incomplete  Culture, blood (routine x 2)     Status: None (Preliminary result)   Collection Time: 01/16/20 11:30 PM   Specimen: BLOOD RIGHT FOREARM  Result Value Ref Range Status   Specimen Description BLOOD RIGHT FOREARM  Final   Special Requests   Final    BOTTLES DRAWN AEROBIC AND ANAEROBIC Blood Culture adequate volume   Culture   Final    NO GROWTH 3 DAYS Performed at [facility], [place] [address], [place], [place] [address]    Report Status PENDING  Incomplete  C difficile quick scan w PCR reflex     Status: Abnormal   Collection Time: 01/17/20  3:05 AM   Specimen: STOOL  Result Value Ref Range Status   C Diff antigen POSITIVE (A) NEGATIVE Final   C Diff toxin NEGATIVE NEGATIVE Final   C Diff interpretation Results are indeterminate. See PCR results.  Final    Comment: Performed at [facility], [place] [address] [place], [place] [address]  C. Diff by PCR, Reflexed     Status: Abnormal   Collection Time: 01/17/20  3:05 AM  Result Value Ref Range Status   Toxigenic C. Difficile by PCR POSITIVE (A) NEGATIVE Final    Comment: Positive for toxigenic C. difficile with little to no toxin production. Only treat if clinical presentation suggests symptomatic illness. Performed at [facility], [place] [address] [place], [place] [address]   MRSA PCR Screening     Status: None   Collection Time: 01/17/20  4:32 PM   Specimen: Nasal Mucosa; Nasopharyngeal  Result Value Ref Range Status    MRSA by PCR NEGATIVE NEGATIVE Final    Comment:        The GeneXpert MRSA Assay (FDA approved for NASAL specimens only), is one component of a comprehensive MRSA colonization surveillance program. It is not intended to diagnose MRSA infection nor to guide or monitor treatment for MRSA infections. Performed at [facility], [place] [address], [place], [place] [address]   Expectorated sputum assessment w rflx to resp cult     Status: None   Collection Time: 01/18/20  3:53 PM   Specimen: Expectorated Sputum  Result Value Ref Range Status   Specimen Description EXPECTORATED SPUTUM  Final   Special Requests NONE  Final   Sputum evaluation   Final    THIS SPECIMEN IS ACCEPTABLE FOR SPUTUM CULTURE Performed at [facility], [place] [address] [place],  [address]    Report Status 01/18/2020 FINAL  Final  Culture, respiratory     Status: None (Preliminary result)   Collection Time: 01/18/20  3:53 PM  Result Value Ref Range Status   Specimen Description EXPECTORATED SPUTUM  Final   Special Requests NONE Reflexed from [id number]  Final   Gram Stain   Final    RARE WBC PRESENT, PREDOMINANTLY PMN  FEW SQUAMOUS EPITHELIAL CELLS PRESENT FEW YEAST RARE GRAM POSITIVE RODS    Culture   Final    CULTURE REINCUBATED FOR BETTER GROWTH Performed at [facility], [place] [address] [place], [place] [address]    Report Status PENDING  Incomplete     Studies: No results found.  Scheduled Meds: . buPROPion  100 mg Oral BID  . FLUoxetine  60 mg Oral Daily  . gabapentin  200 mg Oral BID  . guaiFENesin  600 mg Oral BID  . ipratropium-albuterol  3 mL Nebulization TID  . mouth rinse  15 mL Mouth Rinse BID  . mirtazapine  7.5 mg Oral QHS  . montelukast  10 mg Oral QHS  . predniSONE  10 mg Oral Q breakfast  . primidone  50 mg Oral QHS  . simvastatin  40 mg Oral QHS  . vancomycin  500 mg Oral Q6H    Continuous Infusions: . metronidazole 500 mg (01/20/20 0531)     Time spent:  19mns I have personally reviewed and interpreted on  01/20/2020 daily labs,  imagings as discussed above under date review session and assessment and plans.  I reviewed all nursing notes, pharmacy notes,   vitals, pertinent old records  I have discussed plan of care as described above with RN , patient  on 01/20/2020   FFlorencia ReasonsMD, PhD, FACP  [facility]  Available via Epic secure chat 7am-7pm for nonurgent issues Please page for urgent issues, pager number available through [email or web address] .   01/20/2020, 9:01 AM  LOS: 4 days

## 2020-01-21 DIAGNOSIS — R0602 Shortness of breath: Secondary | ICD-10-CM

## 2020-01-21 DIAGNOSIS — F419 Anxiety disorder, unspecified: Secondary | ICD-10-CM

## 2020-01-21 LAB — CBC WITH DIFFERENTIAL/PLATELET
Abs Immature Granulocytes: 0.78 10*3/uL — ABNORMAL HIGH (ref 0.00–0.07)
Basophils Absolute: 0.1 10*3/uL (ref 0.0–0.1)
Basophils Relative: 0 %
Eosinophils Absolute: 0.1 10*3/uL (ref 0.0–0.5)
Eosinophils Relative: 1 %
HCT: 29.6 % — ABNORMAL LOW (ref 36.0–46.0)
Hemoglobin: 9.2 g/dL — ABNORMAL LOW (ref 12.0–15.0)
Immature Granulocytes: 5 %
Lymphocytes Relative: 11 %
Lymphs Abs: 1.7 10*3/uL (ref 0.7–4.0)
MCH: 31.3 pg (ref 26.0–34.0)
MCHC: 31.1 g/dL (ref 30.0–36.0)
MCV: 100.7 fL — ABNORMAL HIGH (ref 80.0–100.0)
Monocytes Absolute: 1 10*3/uL (ref 0.1–1.0)
Monocytes Relative: 6 %
Neutro Abs: 12.3 10*3/uL — ABNORMAL HIGH (ref 1.7–7.7)
Neutrophils Relative %: 77 %
Platelets: 224 10*3/uL (ref 150–400)
RBC: 2.94 MIL/uL — ABNORMAL LOW (ref 3.87–5.11)
RDW: 16.4 % — ABNORMAL HIGH (ref 11.5–15.5)
WBC: 16.1 10*3/uL — ABNORMAL HIGH (ref 4.0–10.5)
nRBC: 0.3 % — ABNORMAL HIGH (ref 0.0–0.2)

## 2020-01-21 LAB — GI PATHOGEN PANEL BY PCR, STOOL

## 2020-01-21 LAB — COMPREHENSIVE METABOLIC PANEL
ALT: 14 U/L (ref 0–44)
AST: 18 U/L (ref 15–41)
Albumin: 2.6 g/dL — ABNORMAL LOW (ref 3.5–5.0)
Alkaline Phosphatase: 44 U/L (ref 38–126)
Anion gap: 11 (ref 5–15)
BUN: 24 mg/dL — ABNORMAL HIGH (ref 8–23)
CO2: 31 mmol/L (ref 22–32)
Calcium: 8.4 mg/dL — ABNORMAL LOW (ref 8.9–10.3)
Chloride: 99 mmol/L (ref 98–111)
Creatinine, Ser: 0.68 mg/dL (ref 0.44–1.00)
GFR calc Af Amer: >60 mL/min (ref >60)
GFR calc non Af Amer: >60 mL/min (ref >60)
Glucose, Bld: 96 mg/dL (ref 70–99)
Potassium: 3.9 mmol/L (ref 3.5–5.1)
Sodium: 141 mmol/L (ref 135–145)
Total Bilirubin: 0.7 mg/dL (ref 0.3–1.2)
Total Protein: 5.2 g/dL — ABNORMAL LOW (ref 6.5–8.1)

## 2020-01-21 LAB — CULTURE, RESPIRATORY W GRAM STAIN

## 2020-01-21 LAB — MAGNESIUM: Magnesium: 1.9 mg/dL (ref 1.7–2.4)

## 2020-01-21 LAB — OCCULT BLOOD X 1 CARD TO LAB, STOOL: Fecal Occult Bld: POSITIVE — AB

## 2020-01-21 LAB — SARS CORONAVIRUS 2 (TAT 6-24 HRS): SARS Coronavirus 2: NEGATIVE

## 2020-01-21 MED ORDER — CLONAZEPAM 0.125 MG PO TBDP
0.2500 mg | ORAL_TABLET | Freq: Three times a day (TID) | ORAL | Status: DC
  Administered 2020-01-21 – 2020-01-22 (×3): 0.25 mg via ORAL
  Filled 2020-01-21 (×3): qty 2

## 2020-01-21 MED ORDER — MORPHINE SULFATE (CONCENTRATE) 10 MG/0.5ML PO SOLN
5.0000 mg | ORAL | Status: DC | PRN
  Administered 2020-01-21 (×3): 5 mg via ORAL
  Filled 2020-01-21 (×4): qty 0.5

## 2020-01-21 NOTE — Plan of Care (Signed)
  Problem: Coping: Goal: Level of anxiety will decrease Outcome: Progressing   Problem: Pain Managment: Goal: General experience of comfort will improve Outcome: Progressing   Problem: Safety: Goal: Ability to remain free from injury will improve Outcome: Progressing   

## 2020-01-21 NOTE — TOC Progression Note (Addendum)
Transition of Care [facility]) - Progression Note    Patient Details  Name: Sara Pierce MRN: [id number] Date of Birth: 1941-02-10  Transition of Care [facility]) CM/SW [place], [place] Phone Number: 01/21/2020, 3:06 PM  Clinical Narrative:    CSW spoke with PMT NP Wadie Lessen. Discussed pt care needs, at current time discussions have revolved around return to [place] with hospice. CSW provided [facility] with # that this Probation officer has for pt husband Parminder which is  [phone].  CSW following, confirmed with [place] liaison that pt can return with hospice if that is preferred disposition as early as tomorrow.   Also of note this writer did not complete call to [facility], as documented that I may pursue on 2/4, as pt does not have any involvement per [employer]. PMT NP also let this writer know that she believes pt has capacity at this time to make her own decisions regarding care.    Expected Discharge Plan: [place] Barriers to Discharge: Continued Medical Work up  Expected Discharge Plan and Services Expected Discharge Plan: [place] In-house Referral: Clinical Social Work Discharge Planning Services: CM Consult   Living arrangements for the past 2 months: [place]  Readmission Risk Interventions Readmission Risk Prevention Plan 01/17/2020  Transportation Screening Complete  PCP or Specialist Appt within 3-5 Days Not Complete  Not Complete comments SNF resident  Social Work Consult for [place] Planning/Counseling Complete  Palliative Care Screening Complete  Medication Review (RN Transport planner) Referral to Pharmacy  Some recent data might be hidden

## 2020-01-21 NOTE — Progress Notes (Addendum)
Patient ID: Sara Pierce, female   DOB: 1941-05-10, 79 y.o.   MRN: [id number]  This NP visited patient at the bedside as a follow up to  yesterday's [place], for palliative medicine needs and emotional support.  Patient is alert and oriented however she is in distress from dyspnea and coughing.  Continue conversation regarding current medical situation; diagnosis, prognosis, goals of care, end-of-life wishes, disposition and options.  Patient verbalizes an understanding of her overall poor condition and likely limited prognosis.  She tells me "I am not afraid to die".  Again she tells me "I just want someone to take care of me and to be comfortable".   She expresses her desire to return to [place] when she is medically stable and agrees to hospice services at that time.  We discussed symptom management in order to enhance her comfort and dignity.  She agrees.    We will add Roxanol 5 mg p.o./sublingual every 2 hours as needed for dyspnea and cough, and Klonopin 0.25 mg p.o. 3 times daily.  Attempted to contact husband without success.  Was unable to leave voicemail. Questions and concerns addressed   Discussed with bedside RN   Total time spent on the unit was 35 minutes     Palliaitve medicine will continue to support holistically.  Greater than 50% of the time was spent in counseling and coordination of care  Wadie Lessen NP  Palliative Medicine Team Team Phone # [phone] Pager [phone]

## 2020-01-21 NOTE — Progress Notes (Signed)
PROGRESS NOTE    Sara Pierce  ELF:[id number] DOB: 04-Jan-1941 DOA: 01/16/2020 PCP: Marygrace Drought, MD   Brief Narrative:  Per ED triage note at 3pm on 2/3"Pt from [facility] via EMS . Pt A/O with 100% with 8 liters via non re breather . EMS reported to during ADLs the staff thought the Pt died and staff preformed CPR.  Pt reported to EMS that last night she vometed and has been [facility] since then ." Patient reports cpr aborted by the patient herself.  Per patient she vomited the night prior to coming to the hospital, has been more short of breath since vomited, she also reports ab pain and diarrhea  on presentation. Per EDP oxygen requirement has improved since arrival to the ED.    Assessment & Plan:   Principal Problem:   C. difficile colitis Active Problems:   Palliative care by specialist   DNR (do not resuscitate)   Acute on chronic renal insufficiency   Adult failure to thrive  Cdiff colitis Severe with significant leukocytosis and cr elevation on presentation Continue  oval vanc ( was on '500mg'$  q6hr, decreased to '125mg'$ q6hr with clinical improvement),  Change flagyl to oral, plan for total of 14days treatment improving,   Hematochezia with acute blood loss anemia -Had a large bloody mucousy discharge per rectum on 2/5 pm with brief drop of blood pressure , no ab pain -home meds plavix and eliquis discontinued -s/p 2prbc transfusion on 2/5, hemoglobin stable after transfusion -GI Dr Benson [place] consulted on 2/5  "If necessary, an FFS without sedation can be performed." -improved, no plan for scope -monitor, no further blood with BM  AKI on CKDII  resolved  Hyperkalemia: K5.9 on presentation,  Now hypokalemia, resolved with replacement   Acute on chronic hypoxic respiratory failure, h/o copd home o2 (3liter) and prednisone dependent/chronic immunosuppressed status -she arrived to the ED "with 100% with 8 liters via non re breather " by EMS -cxr on presentation :No  acute disease. Left basilar atelectasis noted +wheezing on exam ? Upper airway sounds, but no hypoxia -thick mucus on exam today causing some distress to patient -schedule nebs, add on mucinex, continue prednisone   Chronic combined chf lvef slightly reduced at 50%  home meds metolazone/demadex held since admission due to [facility] Reports feeling short of breath today, palliative care adjusted medications, repeat CXR without edema -closely monitor volume status   Paroxysmal atrial fibrillation Coronary artery disease Denies chest pain, currently in sinus rhythm -Continue home meds Coreg. Patient has aspirin allergy. Hold eliquis and plavix  due to drop of hgb and hematochezia    Morbid obesity: Body mass index is 43.95 kg/m.    Per HPI patient " recently on 12/12/2019 when she was hospitalized for Covid pneumonia. She was discharged back to the facility on 12/25/2019, and has reportedly been treated for pneumonia and volume overload since then"  "Of note, patient met with palliative care on 01/08/2020 who initiated goals of care discussions, as the patient has been considering going home with home hospice. Discussed her goals of care at the time of admission, patient states she would like to proceed with care at this time, though would like to update her CODE STATUS to DNR/limited and potentially go home with home hospice pending further discussions and progression of her symptoms/clinical status overall."  Pressure Injury 01/17/20 Thigh Posterior;Proximal;Right Stage 2 -  Partial thickness loss of dermis presenting as a shallow open injury with a red, pink wound bed without  slough. stage 2 pressure injury (Active)  01/17/20 0145  Location: Thigh  Location Orientation: Posterior;Proximal;Right  Staging: Stage 2 -  Partial thickness loss of dermis presenting as a shallow open injury with a red, pink wound bed without slough.  Wound Description (Comments): stage 2 pressure  injury  Present on Admission: Yes     Pressure Injury 01/17/20 Buttocks Left Stage 2 -  Partial thickness loss of dermis presenting as a shallow open injury with a red, pink wound bed without slough. stage 2 pressure ulcer (Active)  01/17/20 0146  Location: Buttocks  Location Orientation: Left  Staging: Stage 2 -  Partial thickness loss of dermis presenting as a shallow open injury with a red, pink wound bed without slough.  Wound Description (Comments): stage 2 pressure ulcer  Present on Admission: Yes     DVT prophylaxis: SCD Code Status: DNR, confirmed Family Communication: patient only Disposition Plan:  . Patient came from: SNF with hospice ([facility])            . Anticipated d/c place: SNF with hospice . Barriers to d/c OR conditions which need to be met to effect a safe d/c: having more SOB today, adjusted meds via palliative care, hopefully will be cleared to D/C back to SNF with hospice tomorrow 01/22/20  Consultants:   Palliative care  Antimicrobials:   Vancomycin oral  Flagyl oral start    Subjective: Having more SOB today and coughing a lot. Having some stool leaking with the coughing. Denies large volume diarrhea. Denies constipation. Denies abdominal pain. Poor appetite and mild nausea. Denies chest pains. Is not feeling good today.   Objective: Vitals:   01/20/20 2040 01/20/20 2132 01/21/20 0454 01/21/20 0809  BP:  (!) 106/58 137/67   Pulse:  74 80   Resp:  16 18   Temp:  98.2 F (36.8 C) 97.7 F (36.5 C)   TempSrc:  Oral Axillary   SpO2: 91% 99% 98% 96%  Weight:   116.7 kg   Height:        Intake/Output Summary (Last 24 hours) at 01/21/2020 1228 Last data filed at 01/21/2020 7341 Gross per 24 hour  Intake 622.87 ml  Output 1500 ml  Net -877.13 ml   Filed Weights   01/19/20 0500 01/20/20 0600 01/21/20 0454  Weight: 118.2 kg 119.8 kg 116.7 kg    Examination:  General exam: Appears coughing and mildly SOB  Respiratory system: Poor effort,  some wheezing, coughing throughout visit and suctioning mouth without production. Respiratory effort poor Cardiovascular system: S1 & S2 heard, RRR. No JVD, murmurs, rubs, gallops or clicks. No pedal edema. Gastrointestinal system: Abdomen is obese, soft and nontender. No organomegaly or masses felt. Normal bowel sounds heard. Central nervous system: Alert and oriented. No focal neurological deficits. Extremities: Symmetric 5 x 5 power. Skin: No rashes, lesions or ulcers Psychiatry: Judgement and insight appear normal. Mood & affect appropriate.   Data Reviewed: I have personally reviewed following labs and imaging studies  CBC: Recent Labs  Lab 01/17/20 0641 01/17/20 0641 01/18/20 0155 01/18/20 0402 01/18/20 1217 01/18/20 2132 01/19/20 0442 01/20/20 0336 01/21/20 0159  WBC 20.0*  --  11.8*  --   --   --  9.7 15.2* 16.1*  NEUTROABS 17.7*  --  9.8*  --   --   --  8.6* 12.2* 12.3*  HGB 7.8*   < > 6.6*   < > 8.2* 9.3* 9.0* 9.2* 9.2*  HCT 25.4*   < > 21.1*   < >  26.1* 29.1* 28.0* 29.1* 29.6*  MCV 101.6*  --  97.7  --   --   --  96.6 98.6 100.7*  PLT 184  --  190  --   --   --  190 214 224   < > = values in this interval not displayed.   Basic Metabolic Panel: Recent Labs  Lab 01/17/20 0047 01/17/20 0641 01/17/20 1250 01/18/20 0155 01/19/20 0442 01/20/20 0336 01/21/20 0159  NA 133*   < > 131* 132* 138 138 141  K 4.5   < > 5.4* 4.2 4.3 3.4* 3.9  CL 102   < > 92* 95* 101 100 99  CO2 22   < > '27 26 27 29 31  '$ GLUCOSE 83   < > 114* 98 142* 120* 96  BUN 85*   < > 101* 100* 58* 35* 24*  CREATININE 2.12*   < > 2.31* 2.08* 0.93 0.75 0.68  CALCIUM 5.9*   < > 7.3* 7.5* 8.1* 8.4* 8.4*  MG 1.8  --   --   --  2.4 2.0 1.9  PHOS 5.3*  --   --   --   --   --   --    < > = values in this interval not displayed.   GFR: Estimated Creatinine Clearance: 76.4 mL/min (by C-G formula based on SCr of 0.68 mg/dL). Liver Function Tests: Recent Labs  Lab 01/16/20 1540 01/17/20 0641  01/18/20 0155 01/19/20 0442 01/21/20 0159  AST 15 20 10* 18 18  ALT '11 12 9 11 14  '$ ALKPHOS 66 71 49 45 44  BILITOT 0.7 1.0 0.9 0.9 0.7  PROT 5.4* 5.8* 4.8* 5.3* 5.2*  ALBUMIN 2.6* 2.6* 2.3* 2.5* 2.6*   Recent Labs  Lab 01/16/20 1540  LIPASE 17   No results for input(s): AMMONIA in the last 168 hours. Coagulation Profile: No results for input(s): INR, PROTIME in the last 168 hours. Cardiac Enzymes: No results for input(s): CKTOTAL, CKMB, CKMBINDEX, TROPONINI in the last 168 hours. BNP (last 3 results) No results for input(s): PROBNP in the last 8760 hours. HbA1C: No results for input(s): HGBA1C in the last 72 hours. CBG: No results for input(s): GLUCAP in the last 168 hours. Lipid Profile: No results for input(s): CHOL, HDL, LDLCALC, TRIG, CHOLHDL, LDLDIRECT in the last 72 hours. Thyroid Function Tests: No results for input(s): TSH, T4TOTAL, FREET4, T3FREE, THYROIDAB in the last 72 hours. Anemia Panel: Recent Labs    01/19/20 0442  RETICCTPCT 2.0   Sepsis Labs: Recent Labs  Lab 01/16/20 2253  LATICACIDVEN 0.9    Recent Results (from the past 240 hour(s))  SARS CORONAVIRUS 2 (TAT 6-24 HRS) Nasopharyngeal Nasopharyngeal Swab     Status: None   Collection Time: 01/16/20 11:30 PM   Specimen: Nasopharyngeal Swab  Result Value Ref Range Status   SARS Coronavirus 2 NEGATIVE NEGATIVE Final    Comment: (NOTE) SARS-CoV-2 target nucleic acids are NOT DETECTED. The SARS-CoV-2 RNA is generally detectable in upper and lower respiratory specimens during the acute phase of infection. Negative results do not preclude SARS-CoV-2 infection, do not rule out co-infections with other pathogens, and should not be used as the sole basis for treatment or other patient management decisions. Negative results must be combined with clinical observations, patient history, and epidemiological information. The expected result is Negative. Fact Sheet for  Patients: SugarRoll.be Fact Sheet for Healthcare Providers: https://www.woods-mathews.com/ This test is not yet approved or cleared by the [place] FDA and  has been authorized for detection and/or diagnosis of SARS-CoV-2 by FDA under an Emergency Use Authorization (EUA). This EUA will remain  in effect (meaning this test can be used) for the duration of the COVID-19 declaration under Section 56 4(b)(1) of the Act, 21 U.S.C. section 360bbb-3(b)(1), unless the authorization is terminated or revoked sooner. Performed at [facility], [place] [address] [place], [place] [address]   Culture, blood (routine x 2)     Status: None (Preliminary result)   Collection Time: 01/16/20 11:30 PM   Specimen: BLOOD  Result Value Ref Range Status   Specimen Description BLOOD LEFT ANTECUBITAL  Final   Special Requests   Final    BOTTLES DRAWN AEROBIC AND ANAEROBIC Blood Culture adequate volume   Culture   Final    NO GROWTH 3 DAYS Performed at [facility], [place] [address] [place], [place] [address]    Report Status PENDING  Incomplete  Culture, blood (routine x 2)     Status: None (Preliminary result)   Collection Time: 01/16/20 11:30 PM   Specimen: BLOOD RIGHT FOREARM  Result Value Ref Range Status   Specimen Description BLOOD RIGHT FOREARM  Final   Special Requests   Final    BOTTLES DRAWN AEROBIC AND ANAEROBIC Blood Culture adequate volume   Culture   Final    NO GROWTH 3 DAYS Performed at [facility], [place] [address], [place], [place] [address]    Report Status PENDING  Incomplete  C difficile quick scan w PCR reflex     Status: Abnormal   Collection Time: 01/17/20  3:05 AM   Specimen: STOOL  Result Value Ref Range Status   C Diff antigen POSITIVE (A) NEGATIVE Final   C Diff toxin NEGATIVE NEGATIVE Final   C Diff interpretation Results are indeterminate. See PCR results.  Final    Comment: Performed at [facility],  [place] [address], [place], [place] [address]  C. Diff by PCR, Reflexed     Status: Abnormal   Collection Time: 01/17/20  3:05 AM  Result Value Ref Range Status   Toxigenic C. Difficile by PCR POSITIVE (A) NEGATIVE Final    Comment: Positive for toxigenic C. difficile with little to no toxin production. Only treat if clinical presentation suggests symptomatic illness. Performed at [facility], [place] [address], [place], [place] [address]   MRSA PCR Screening     Status: None   Collection Time: 01/17/20  4:32 PM   Specimen: Nasal Mucosa; Nasopharyngeal  Result Value Ref Range Status   MRSA by PCR NEGATIVE NEGATIVE Final    Comment:        The GeneXpert MRSA Assay (FDA approved for NASAL specimens only), is one component of a comprehensive MRSA colonization surveillance program. It is not intended to diagnose MRSA infection nor to guide or monitor treatment for MRSA infections. Performed at [facility], [place] [address] [place], [place] [address]   Expectorated sputum assessment w rflx to resp cult     Status: None   Collection Time: 01/18/20  3:53 PM   Specimen: Expectorated Sputum  Result Value Ref Range Status   Specimen Description EXPECTORATED SPUTUM  Final   Special Requests NONE  Final   Sputum evaluation   Final    THIS SPECIMEN IS ACCEPTABLE FOR SPUTUM CULTURE Performed at [facility], [place] [address] [place], [place] [address]    Report Status 01/18/2020 FINAL  Final  Culture, respiratory     Status: None (Preliminary result)   Collection Time: 01/18/20  3:53 PM  Result Value Ref Range Status   Specimen Description EXPECTORATED SPUTUM  Final   Special Requests NONE Reflexed from [id number]  Final   Gram Stain   Final    RARE WBC PRESENT, PREDOMINANTLY PMN FEW SQUAMOUS EPITHELIAL CELLS PRESENT FEW YEAST RARE GRAM POSITIVE RODS    Culture   Final    FEW YEAST IDENTIFICATION TO FOLLOW Performed at [facility], [place] [address], [place], [place]  [address]    Report Status PENDING  Incomplete  SARS CORONAVIRUS 2 (TAT 6-24 HRS) Nasopharyngeal Nasopharyngeal Swab     Status: None   Collection Time: 01/21/20  5:05 AM   Specimen: Nasopharyngeal Swab  Result Value Ref Range Status   SARS Coronavirus 2 NEGATIVE NEGATIVE Final    Comment: (NOTE) SARS-CoV-2 target nucleic acids are NOT DETECTED. The SARS-CoV-2 RNA is generally detectable in upper and lower respiratory specimens during the acute phase of infection. Negative results do not preclude SARS-CoV-2 infection, do not rule out co-infections with other pathogens, and should not be used as the sole basis for treatment or other patient management decisions. Negative results must be combined with clinical observations, patient history, and epidemiological information. The expected result is Negative. Fact Sheet for Patients: SugarRoll.be Fact Sheet for Healthcare Providers: https://www.woods-mathews.com/ This test is not yet approved or cleared by the [place] FDA and  has been authorized for detection and/or diagnosis of SARS-CoV-2 by FDA under an Emergency Use Authorization (EUA). This EUA will remain  in effect (meaning this test can be used) for the duration of the COVID-19 declaration under Section 56 4(b)(1) of the Act, 21 U.S.C. section 360bbb-3(b)(1), unless the authorization is terminated or revoked sooner. Performed at [facility], [place] [address], [place], [place] [address]     Radiology Studies: DG CHEST PORT 1 VIEW  Result Date: 01/20/2020 CLINICAL DATA:  Wheezing EXAM: PORTABLE CHEST 1 VIEW COMPARISON:  January 17, 2020 FINDINGS: The heart size and mediastinal contours are unchanged with mild cardiomegaly. Aortic knob calcifications. There is again noted left basilar subsegmental atelectasis. Overall shallow degree of aeration. The right lung remains clear. No acute osseous abnormality. IMPRESSION: No active disease.   Unchanged left basilar atelectasis. Electronically Signed   By: Prudencio Pair M.D.   On: 01/20/2020 19:04   Scheduled Meds: . buPROPion  100 mg Oral BID  . FLUoxetine  60 mg Oral Daily  . furosemide  40 mg Intravenous BID  . gabapentin  200 mg Oral BID  . guaiFENesin  600 mg Oral BID  . ipratropium-albuterol  3 mL Nebulization TID  . mouth rinse  15 mL Mouth Rinse BID  . metroNIDAZOLE  500 mg Oral Q8H  . mirtazapine  7.5 mg Oral QHS  . montelukast  10 mg Oral QHS  . predniSONE  10 mg Oral Q breakfast  . primidone  50 mg Oral QHS  . simvastatin  40 mg Oral QHS  . vancomycin  125 mg Oral Q6H   Continuous Infusions:   LOS: 5 days   Time spent: 25  Hoyt Koch, MD [facility]  To contact the attending provider between 7A-7P or the covering provider during after hours 7P-7A, please log into the web site [email or web address] and access using universal [place] password for that web site. If you do not have the password, please call the hospital operator.  01/21/2020, 12:28  PM

## 2020-01-22 LAB — BASIC METABOLIC PANEL
Anion gap: 9 (ref 5–15)
BUN: 19 mg/dL (ref 8–23)
CO2: 32 mmol/L (ref 22–32)
Calcium: 8.6 mg/dL — ABNORMAL LOW (ref 8.9–10.3)
Chloride: 100 mmol/L (ref 98–111)
Creatinine, Ser: 0.63 mg/dL (ref 0.44–1.00)
GFR calc Af Amer: >60 mL/min (ref >60)
GFR calc non Af Amer: >60 mL/min (ref >60)
Glucose, Bld: 96 mg/dL (ref 70–99)
Potassium: 4 mmol/L (ref 3.5–5.1)
Sodium: 141 mmol/L (ref 135–145)

## 2020-01-22 LAB — CBC
HCT: 32.7 % — ABNORMAL LOW (ref 36.0–46.0)
Hemoglobin: 9.8 g/dL — ABNORMAL LOW (ref 12.0–15.0)
MCH: 30.9 pg (ref 26.0–34.0)
MCHC: 30 g/dL (ref 30.0–36.0)
MCV: 103.2 fL — ABNORMAL HIGH (ref 80.0–100.0)
Platelets: 239 10*3/uL (ref 150–400)
RBC: 3.17 MIL/uL — ABNORMAL LOW (ref 3.87–5.11)
RDW: 16.1 % — ABNORMAL HIGH (ref 11.5–15.5)
WBC: 19.7 10*3/uL — ABNORMAL HIGH (ref 4.0–10.5)
nRBC: 0.4 % — ABNORMAL HIGH (ref 0.0–0.2)

## 2020-01-22 LAB — CULTURE, BLOOD (ROUTINE X 2)
Culture: NO GROWTH
Culture: NO GROWTH
Special Requests: ADEQUATE
Special Requests: ADEQUATE

## 2020-01-22 MED ORDER — METRONIDAZOLE 500 MG PO TABS: 500.0000 mg | ORAL_TABLET | Freq: Three times a day (TID) | ORAL | 0 refills | Status: AC

## 2020-01-22 MED ORDER — CLONAZEPAM 0.5 MG PO TABS: 0.5000 mg | ORAL_TABLET | Freq: Three times a day (TID) | ORAL | 0 refills | Status: DC | PRN

## 2020-01-22 MED ORDER — MORPHINE SULFATE (CONCENTRATE) 10 MG/0.5ML PO SOLN: 5.0000 mg | ORAL | 0 refills | Status: AC | PRN

## 2020-01-22 MED ORDER — ONDANSETRON HCL 4 MG PO TABS: 4.0000 mg | ORAL_TABLET | Freq: Four times a day (QID) | ORAL | 0 refills | Status: AC | PRN

## 2020-01-22 MED ORDER — MORPHINE SULFATE (CONCENTRATE) 10 MG/0.5ML PO SOLN: 5.0000 mg | ORAL | 0 refills | Status: DC | PRN

## 2020-01-22 MED ORDER — FUROSEMIDE 40 MG PO TABS
40.0000 mg | ORAL_TABLET | Freq: Two times a day (BID) | ORAL | Status: DC
  Administered 2020-01-22: 40 mg via ORAL
  Filled 2020-01-22: qty 1

## 2020-01-22 MED ORDER — VANCOMYCIN 50 MG/ML ORAL SOLUTION: 125.0000 mg | Freq: Four times a day (QID) | ORAL | 0 refills | Status: AC

## 2020-01-22 NOTE — Progress Notes (Addendum)
[facility] is here to transport pt to [facility]. Pt belongings sent with pt.

## 2020-01-22 NOTE — Progress Notes (Signed)
Called report to [place] with Etheleen Nicks, RN. All questions answered.

## 2020-01-22 NOTE — Social Work (Signed)
Clinical Social Worker facilitated patient discharge including contacting patient family and facility to confirm patient discharge plans.  Clinical information faxed to facility and family agreeable with plan.  CSW arranged ambulance transport via [facility] to [place] at 2:30pm RN to call [phone]  with report prior to discharge.  Clinical Social Worker will sign off for now as social work intervention is no longer needed. Please consult [place] again if new need arises.  Westley Hummer, MSW, LCSW Clinical Social Worker

## 2020-01-22 NOTE — Discharge Summary (Signed)
Physician Discharge Summary  Sara Pierce [id number] DOB: 04-11-1941 DOA: 01/16/2020  PCP: Marygrace Drought, MD  Admit date: 01/16/2020 Discharge date: 01/22/2020  Admitted From: [facility] with hospice Disposition:  [facility] with hospice  Recommendations for Outpatient Follow-up:  1. Follow up with SNF MD in 1-2 weeks 2. Please obtain BMP/CBC in one week 3. Finish oral vancomycin and flagyl for C dif infection which finishes on 01/30/20 4. Please follow up on the following pending results:  Home Health: defer to SNF Equipment/Devices:oxygen   Discharge Condition:stable CODE STATUS:DNR Diet recommendation: Heart Healthy  Brief/Interim Summary: Per ED triage note at 3pm on 2/3"Pt from [facility] via EMS . Pt A/O with 100% with 8 liters via non re breather . EMS reported to during ADLs the staff thought the Pt died and staff preformed CPR.  Pt reported to EMS that last night she vometed and has been [facility] since then  Patient reports cpr aborted by the patient herself.  Per patient she vomited the night prior to coming to the hospital, has been more short of breath since vomited, she also reports ab pain and diarrhea on presentation. Per EDP oxygen requirement has improved since arrival to the ED.   Hospital course: Diagnosed with C dif with rectal bleeding and treated appropriately with vancomycin and flagyl with improvement of the stools to semi-formed on day of discharge. She did receive 2 units PRBC on 01/18/20 with good stabilization of Hg and no further bleeding after admission. Medications were adjusted for anxiety and breathing while inpatient per palliative care. She is aware that she does have declining health and is comfortable with her quality at this time. Her eliquis and plavix were held through the admission and would not recommend resuming eliquis after discharge. Will need assessment if restarting plavix is appropriate in 1 week. She does have code status of DNR. We  did plan for oral flagyl and vancomycin for 14 days with stop date of 01/30/20.  Discharge Diagnoses:  Principal Problem:   C. difficile colitis Active Problems:   Palliative care by specialist   DNR (do not resuscitate)   Acute on chronic renal insufficiency   Adult failure to thrive   Shortness of breath  Discharge Instructions Discharge Instructions    Call MD for:  persistant nausea and vomiting   Complete by: As directed    Call MD for:  temperature >100.4   Complete by: As directed    Diet - low sodium heart healthy   Complete by: As directed    Increase activity slowly   Complete by: As directed      Allergies as of 01/22/2020      Reactions   Aspirin Swelling   Codeine Itching   Naprosyn [naproxen] Other (See Comments)   Per MAR pt allergic   Other    Elastic Bandages/ Supports Per MAR   Tape Hives   Valacyclovir    Latex Rash      Medication List    STOP taking these medications   azithromycin 250 MG tablet Commonly known as: ZITHROMAX   clopidogrel 75 MG tablet Commonly known as: PLAVIX   Eliquis 2.5 MG Tabs tablet Generic drug: apixaban   metolazone 2.5 MG tablet Commonly known as: ZAROXOLYN     TAKE these medications   acetaminophen 325 MG tablet Commonly known as: TYLENOL Take 2 tablets (650 mg total) by mouth every 6 (six) hours as needed for mild pain (or Fever >/= 101).   albuterol  108 (90 Base) MCG/ACT inhaler Commonly known as: VENTOLIN HFA Inhale 2 puffs into the lungs every 6 (six) hours as needed for wheezing or shortness of breath.   Anoro Ellipta 62.5-25 MCG/INH Aepb Generic drug: umeclidinium-vilanterol Inhale 1 puff into the lungs daily.   B-complex with vitamin C tablet Take 1 tablet by mouth daily.   budesonide 0.5 MG/2ML nebulizer solution Commonly known as: PULMICORT Take 2 mLs (0.5 mg total) 2 (two) times daily by nebulization.   buPROPion 100 MG 12 hr tablet Commonly known as: WELLBUTRIN SR Take 100 mg by mouth  daily.   carvedilol 3.125 MG tablet Commonly known as: COREG Take 3.125 mg by mouth at bedtime.   clonazePAM 0.5 MG tablet Commonly known as: KLONOPIN Take 1 tablet (0.5 mg total) by mouth 3 (three) times daily as needed for anxiety. What changed:   medication strength  how much to take  when to take this  reasons to take this   FLUoxetine 40 MG capsule Commonly known as: PROZAC Take 40 mg by mouth daily. Take with Fluoxetine 20 mg to equal 60 mg   FLUoxetine 20 MG capsule Commonly known as: PROZAC Take 20 mg by mouth daily. Take with Fluoxetine 40 mg to equal 60 mg   fluticasone 50 MCG/ACT nasal spray Commonly known as: FLONASE Place 2 sprays into both nostrils 2 (two) times daily as needed for allergies.   furosemide 20 MG tablet Commonly known as: LASIX Take 1 tablet (20 mg total) by mouth 2 (two) times daily.   gabapentin 100 MG capsule Commonly known as: NEURONTIN Take 2 capsules (200 mg total) by mouth 3 (three) times daily.   guaiFENesin 600 MG 12 hr tablet Commonly known as: MUCINEX Take 1 tablet (600 mg total) by mouth 2 (two) times daily.   guaiFENesin 600 MG 12 hr tablet Commonly known as: Mucinex Take 1 tablet (600 mg total) by mouth 2 (two) times daily.   Combivent Respimat 20-100 MCG/ACT Aers respimat Generic drug: Ipratropium-Albuterol Inhale 1 puff into the lungs 3 (three) times daily. What changed: Another medication with the same name was changed. Make sure you understand how and when to take each.   ipratropium-albuterol 0.5-2.5 (3) MG/3ML Soln Commonly known as: DUONEB Take 3 mLs by nebulization 4 (four) times daily. Dx:J44.9 What changed:   when to take this  reasons to take this   magnesium oxide 400 MG tablet Commonly known as: MAG-OX Take 400 mg by mouth daily.   Melatonin 3 MG Tabs Take 3 mg by mouth at bedtime.   metroNIDAZOLE 500 MG tablet Commonly known as: FLAGYL Take 1 tablet (500 mg total) by mouth every 8 (eight)  hours for 8 days.   mirtazapine 15 MG tablet Commonly known as: REMERON Take 7.5 mg by mouth at bedtime.   montelukast 10 MG tablet Commonly known as: SINGULAIR Take 10 mg by mouth at bedtime.   morphine CONCENTRATE 10 MG/0.5ML Soln concentrated solution Take 0.25 mLs (5 mg total) by mouth every 2 (two) hours as needed for up to 5 days for moderate pain or shortness of breath.   ondansetron 4 MG tablet Commonly known as: ZOFRAN Take 1 tablet (4 mg total) by mouth every 6 (six) hours as needed for nausea.   polyethylene glycol 17 g packet Commonly known as: MIRALAX / GLYCOLAX Take 17 g by mouth daily.   potassium chloride SA 20 MEQ tablet Commonly known as: KLOR-CON Take 20 mEq by mouth daily.   predniSONE 10 MG  tablet Commonly known as: DELTASONE Take 10 mg by mouth every evening.   primidone 50 MG tablet Commonly known as: MYSOLINE Take 50 mg by mouth at bedtime.   simvastatin 40 MG tablet Commonly known as: ZOCOR Take 40 mg by mouth at bedtime.   spironolactone 25 MG tablet Commonly known as: ALDACTONE Take 25 mg by mouth daily.   torsemide 20 MG tablet Commonly known as: DEMADEX Take 40 mg by mouth 2 (two) times daily.   vancomycin 50 mg/mL  oral solution Commonly known as: VANCOCIN Take 2.5 mLs (125 mg total) by mouth every 6 (six) hours for 8 days.      Contact information for after-discharge care    Destination    [facility] .   Service: Skilled Nursing Contact information: [place] [phone]             Allergies  Allergen Reactions  . Aspirin Swelling  . Codeine Itching  . Naprosyn [Naproxen] Other (See Comments)    Per MAR pt allergic  . Other     Elastic Bandages/ Supports Per MAR  . Tape Hives  . Valacyclovir   . Latex Rash    Consultations:  GI  Palliative care  Procedures/Studies: CT ABDOMEN PELVIS WO CONTRAST  Result Date: 01/16/2020 CLINICAL DATA:  Nausea and  vomiting. EXAM: CT ABDOMEN AND PELVIS WITHOUT CONTRAST TECHNIQUE: Multidetector CT imaging of the abdomen and pelvis was performed following the standard protocol without IV contrast. COMPARISON:  March 01, 2018 FINDINGS: Lower chest: Moderate-severity atelectatic changes are seen within the bilateral lung bases. Hepatobiliary: No focal liver abnormality is seen. No gallstones, gallbladder wall thickening, or biliary dilatation. Pancreas: Unremarkable. No pancreatic ductal dilatation or surrounding inflammatory changes. Spleen: Normal in size without focal abnormality. Adrenals/Urinary Tract: There is mild diffuse enlargement of the right adrenal gland. 1.4 cm x 1.2 cm and 1.3 cm x 1.1 cm low-attenuation left adrenal masses are noted. Kidneys are normal, without renal calculi, focal lesion, or hydronephrosis. Bladder is unremarkable. Stomach/Bowel: There is a small hiatal hernia. The appendix is not identified. No evidence of bowel dilatation. There is mild diffuse thickening of the mid and distal sigmoid colon with a mild amount of pericolonic inflammatory fat stranding. Vascular/Lymphatic: Marked severity aortic atherosclerosis. No enlarged abdominal or pelvic lymph nodes. Reproductive: Ill-defined coarse appearing uterine calcifications are seen. Other: No abdominal wall hernia or abnormality. No abdominopelvic ascites. Musculoskeletal: Multilevel degenerative changes seen throughout the lumbar spine. IMPRESSION: 1. Mild to moderate severity colitis involving the mid and distal sigmoid colon. 2. Small hiatal hernia. 3. 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 4. Marked severity aortic atherosclerosis. Aortic Atherosclerosis (ICD10-I70.0). Electronically Signed   By: Virgina Norfolk M.D.   On: 01/16/2020 21:03   DG Chest 2 View  Result Date: 01/16/2020 CLINICAL DATA:  79 year old female with concern for aspiration. EXAM: CHEST - 2 VIEW COMPARISON:  Chest radiograph dated  09/16/2018 and 04/21/2018. FINDINGS: Stable cardiomegaly. There is prominence of the hilar vasculature, likely representing a degree of pulmonary hypertension. Left lung base density may represent atelectasis or infiltrate. Small layering pleural effusion may be present on the lateral view. No pneumothorax. Atherosclerotic calcification of the aorta. No acute osseous pathology. Partially visualized right shoulder arthroplasty. IMPRESSION: 1. Left lung base atelectasis or infiltrate. A small left pleural effusion may be present. 2. Cardiomegaly with findings suggestive of pulmonary hypertension. Electronically Signed   By: Anner Crete M.D.   On: 01/16/2020 16:13  DG CHEST PORT 1 VIEW  Result Date: 01/20/2020 CLINICAL DATA:  Wheezing EXAM: PORTABLE CHEST 1 VIEW COMPARISON:  January 17, 2020 FINDINGS: The heart size and mediastinal contours are unchanged with mild cardiomegaly. Aortic knob calcifications. There is again noted left basilar subsegmental atelectasis. Overall shallow degree of aeration. The right lung remains clear. No acute osseous abnormality. IMPRESSION: No active disease.  Unchanged left basilar atelectasis. Electronically Signed   By: Prudencio Pair M.D.   On: 01/20/2020 19:04   DG CHEST PORT 1 VIEW  Result Date: 01/17/2020 CLINICAL DATA:  Cough in a patient with a history of COPD. EXAM: PORTABLE CHEST 1 VIEW COMPARISON:  PA and lateral chest 01/16/2020. Single-view of the chest 09/16/2018. FINDINGS: There is left basilar atelectasis. Right lung is clear. Heart size is upper normal. Aortic atherosclerosis noted. No pneumothorax or pleural fluid. No acute bony abnormality. IMPRESSION: No acute disease.  Left basilar atelectasis noted. Atherosclerosis. Electronically Signed   By: Inge Rise M.D.   On: 01/17/2020 12:29    Subjective: Feeling some better today, breathing easier. Denies chest pains or abdominal pain. Some semi-formed stool this morning which is an improvement from mucus  only. Denies nausea or vomiting. Appetite still not great. Wants to go back to her SNF today if possible.   Discharge Exam: Vitals:   01/22/20 0500 01/22/20 0827  BP: 108/60   Pulse: 70   Resp: 14   Temp: 98.3 F (36.8 C)   SpO2: 95% 94%   Vitals:   01/21/20 2133 01/21/20 2155 01/22/20 0500 01/22/20 0827  BP:  102/61 108/60   Pulse:  69 70   Resp:  14 14   Temp:  98 F (36.7 C) 98.3 F (36.8 C)   TempSrc:  Oral Oral   SpO2: 93% 95% 95% 94%  Weight:      Height:        General: Pt is alert, awake, not in acute distress, breathing much better than yesterday Cardiovascular: RRR, S1/S2 +, no rubs, no gallops Respiratory: CTA bilaterally, no wheezing, no rhonchi Abdominal: Soft, NT, ND, bowel sounds + Extremities: no edema, no cyanosis  The results of significant diagnostics from this hospitalization (including imaging, microbiology, ancillary and laboratory) are listed below for reference.     Microbiology: Recent Results (from the past 240 hour(s))  SARS CORONAVIRUS 2 (TAT 6-24 HRS) Nasopharyngeal Nasopharyngeal Swab     Status: None   Collection Time: 01/16/20 11:30 PM   Specimen: Nasopharyngeal Swab  Result Value Ref Range Status   SARS Coronavirus 2 NEGATIVE NEGATIVE Final    Comment: (NOTE) SARS-CoV-2 target nucleic acids are NOT DETECTED. The SARS-CoV-2 RNA is generally detectable in upper and lower respiratory specimens during the acute phase of infection. Negative results do not preclude SARS-CoV-2 infection, do not rule out co-infections with other pathogens, and should not be used as the sole basis for treatment or other patient management decisions. Negative results must be combined with clinical observations, patient history, and epidemiological information. The expected result is Negative. Fact Sheet for Patients: SugarRoll.be Fact Sheet for Healthcare Providers: https://www.woods-mathews.com/ This test is not  yet approved or cleared by the [place] FDA and  has been authorized for detection and/or diagnosis of SARS-CoV-2 by FDA under an Emergency Use Authorization (EUA). This EUA will remain  in effect (meaning this test can be used) for the duration of the COVID-19 declaration under Section 56 4(b)(1) of the Act, 21 U.S.C. section 360bbb-3(b)(1), unless the authorization is terminated or  revoked sooner. Performed at [facility], [place] [address], [place], [place] [address]   Culture, blood (routine x 2)     Status: None   Collection Time: 01/16/20 11:30 PM   Specimen: BLOOD  Result Value Ref Range Status   Specimen Description BLOOD LEFT ANTECUBITAL  Final   Special Requests   Final    BOTTLES DRAWN AEROBIC AND ANAEROBIC Blood Culture adequate volume   Culture   Final    NO GROWTH 5 DAYS Performed at [facility], [place] [address], [place], [place] [address]    Report Status 01/22/2020 FINAL  Final  Culture, blood (routine x 2)     Status: None   Collection Time: 01/16/20 11:30 PM   Specimen: BLOOD RIGHT FOREARM  Result Value Ref Range Status   Specimen Description BLOOD RIGHT FOREARM  Final   Special Requests   Final    BOTTLES DRAWN AEROBIC AND ANAEROBIC Blood Culture adequate volume   Culture   Final    NO GROWTH 5 DAYS Performed at [facility], [place] [address] [place], [place] [address]    Report Status 01/22/2020 FINAL  Final  C difficile quick scan w PCR reflex     Status: Abnormal   Collection Time: 01/17/20  3:05 AM   Specimen: STOOL  Result Value Ref Range Status   C Diff antigen POSITIVE (A) NEGATIVE Final   C Diff toxin NEGATIVE NEGATIVE Final   C Diff interpretation Results are indeterminate. See PCR results.  Final    Comment: Performed at [facility], [place] [address], [place], [place] [address]  C. Diff by PCR, Reflexed     Status: Abnormal   Collection Time: 01/17/20  3:05 AM  Result Value Ref Range Status   Toxigenic C. Difficile by  PCR POSITIVE (A) NEGATIVE Final    Comment: Positive for toxigenic C. difficile with little to no toxin production. Only treat if clinical presentation suggests symptomatic illness. Performed at [facility], [place] [address], [place], [place] [address]   GI pathogen panel by PCR, stool     Status: None   Collection Time: 01/17/20  3:06 AM   Specimen: STOOL  Result Value Ref Range Status   Plesiomonas shigelloides NOT DETECTED NOT DETECTED Final   Yersinia enterocolitica NOT DETECTED NOT DETECTED Final   Vibrio NOT DETECTED NOT DETECTED Final   Enteropathogenic E coli NOT DETECTED NOT DETECTED Final   E coli (ETEC) LT/ST NOT DETECTED NOT DETECTED Final   E coli [id number] by PCR Not applicable NOT DETECTED Final   Cryptosporidium by PCR NOT DETECTED NOT DETECTED Final   Entamoeba histolytica NOT DETECTED NOT DETECTED Final   Adenovirus F 40/41 NOT DETECTED NOT DETECTED Final   Norovirus GI/GII NOT DETECTED NOT DETECTED Final   Sapovirus NOT DETECTED NOT DETECTED Final    Comment: (NOTE) Performed At: [facility] [place], [place] [id number] Rush Farmer MD [id number]    Vibrio cholerae NOT DETECTED NOT DETECTED Final   Campylobacter by PCR NOT DETECTED NOT DETECTED Final   Salmonella by PCR NOT DETECTED NOT DETECTED Final   E coli (STEC) NOT DETECTED NOT DETECTED Final   Enteroaggregative E coli NOT DETECTED NOT DETECTED Final   Shigella by PCR NOT DETECTED NOT DETECTED Final   Cyclospora cayetanensis NOT DETECTED NOT DETECTED Final   Astrovirus NOT DETECTED NOT DETECTED Final   G lamblia by PCR NOT DETECTED NOT DETECTED Final  Rotavirus A by PCR NOT DETECTED NOT DETECTED Final  MRSA PCR Screening     Status: None   Collection Time: 01/17/20  4:32 PM   Specimen: Nasal Mucosa; Nasopharyngeal  Result Value Ref Range Status   MRSA by PCR NEGATIVE NEGATIVE Final    Comment:        The GeneXpert MRSA Assay (FDA approved for NASAL specimens only), is one  component of a comprehensive MRSA colonization surveillance program. It is not intended to diagnose MRSA infection nor to guide or monitor treatment for MRSA infections. Performed at [facility], [place] [address] [place], [place] [address]   Expectorated sputum assessment w rflx to resp cult     Status: None   Collection Time: 01/18/20  3:53 PM   Specimen: Expectorated Sputum  Result Value Ref Range Status   Specimen Description EXPECTORATED SPUTUM  Final   Special Requests NONE  Final   Sputum evaluation   Final    THIS SPECIMEN IS ACCEPTABLE FOR SPUTUM CULTURE Performed at [facility], [place] [address], [place], [place] [address]    Report Status 01/18/2020 FINAL  Final  Culture, respiratory     Status: None   Collection Time: 01/18/20  3:53 PM  Result Value Ref Range Status   Specimen Description EXPECTORATED SPUTUM  Final   Special Requests NONE Reflexed from [id number]  Final   Gram Stain   Final    RARE WBC PRESENT, PREDOMINANTLY PMN FEW SQUAMOUS EPITHELIAL CELLS PRESENT FEW YEAST RARE GRAM POSITIVE RODS Performed at [facility], [place] [address] [place], [place] [address]    Culture FEW CANDIDA ALBICANS  Final   Report Status 01/21/2020 FINAL  Final  SARS CORONAVIRUS 2 (TAT 6-24 HRS) Nasopharyngeal Nasopharyngeal Swab     Status: None   Collection Time: 01/21/20  5:05 AM   Specimen: Nasopharyngeal Swab  Result Value Ref Range Status   SARS Coronavirus 2 NEGATIVE NEGATIVE Final    Comment: (NOTE) SARS-CoV-2 target nucleic acids are NOT DETECTED. The SARS-CoV-2 RNA is generally detectable in upper and lower respiratory specimens during the acute phase of infection. Negative results do not preclude SARS-CoV-2 infection, do not rule out co-infections with other pathogens, and should not be used as the sole basis for treatment or other patient management decisions. Negative results must be combined with clinical observations, patient history, and  epidemiological information. The expected result is Negative. Fact Sheet for Patients: SugarRoll.be Fact Sheet for Healthcare Providers: https://www.woods-mathews.com/ This test is not yet approved or cleared by the [place] FDA and  has been authorized for detection and/or diagnosis of SARS-CoV-2 by FDA under an Emergency Use Authorization (EUA). This EUA will remain  in effect (meaning this test can be used) for the duration of the COVID-19 declaration under Section 56 4(b)(1) of the Act, 21 U.S.C. section 360bbb-3(b)(1), unless the authorization is terminated or revoked sooner. Performed at [facility], [place] [address] [place], [place] [address]     Labs: BNP (last 3 results) Recent Labs    01/17/20 0144  BNP [id number]*   Basic Metabolic Panel: Recent Labs  Lab 01/17/20 0047 01/17/20 0641 01/18/20 0155 01/19/20 0442 01/20/20 0336 01/21/20 0159 01/22/20 0232  NA 133*   < > 132* 138 138 141 141  K 4.5   < > 4.2 4.3 3.4* 3.9 4.0  CL 102   < > 95* 101 100 99 100  CO2 22   < > 26 27  29 31 32  GLUCOSE 83   < > 98 142* 120* 96 96  BUN 85*   < > 100* 58* 35* 24* 19  CREATININE 2.12*   < > 2.08* 0.93 0.75 0.68 0.63  CALCIUM 5.9*   < > 7.5* 8.1* 8.4* 8.4* 8.6*  MG 1.8  --   --  2.4 2.0 1.9  --   PHOS 5.3*  --   --   --   --   --   --    < > = values in this interval not displayed.   Liver Function Tests: Recent Labs  Lab 01/16/20 1540 01/17/20 0641 01/18/20 0155 01/19/20 0442 01/21/20 0159  AST 15 20 10* 18 18  ALT 11 12 9 11 14   ALKPHOS 66 71 49 45 44  BILITOT 0.7 1.0 0.9 0.9 0.7  PROT 5.4* 5.8* 4.8* 5.3* 5.2*  ALBUMIN 2.6* 2.6* 2.3* 2.5* 2.6*   Recent Labs  Lab 01/16/20 1540  LIPASE 17   No results for input(s): AMMONIA in the last 168 hours. CBC: Recent Labs  Lab 01/17/20 0641 01/17/20 0641 01/18/20 0155 01/18/20 0402 01/18/20 2132 01/19/20 0442 01/20/20 0336 01/21/20 0159 01/22/20 0232  WBC  20.0*   < > 11.8*  --   --  9.7 15.2* 16.1* 19.7*  NEUTROABS 17.7*  --  9.8*  --   --  8.6* 12.2* 12.3*  --   HGB 7.8*   < > 6.6*   < > 9.3* 9.0* 9.2* 9.2* 9.8*  HCT 25.4*   < > 21.1*   < > 29.1* 28.0* 29.1* 29.6* 32.7*  MCV 101.6*   < > 97.7  --   --  96.6 98.6 100.7* 103.2*  PLT 184   < > 190  --   --  190 214 224 239   < > = values in this interval not displayed.   Cardiac Enzymes: No results for input(s): CKTOTAL, CKMB, CKMBINDEX, TROPONINI in the last 168 hours. BNP: Invalid input(s): POCBNP CBG: No results for input(s): GLUCAP in the last 168 hours. D-Dimer No results for input(s): DDIMER in the last 72 hours. Hgb A1c No results for input(s): HGBA1C in the last 72 hours. Lipid Profile No results for input(s): CHOL, HDL, LDLCALC, TRIG, CHOLHDL, LDLDIRECT in the last 72 hours. Thyroid function studies No results for input(s): TSH, T4TOTAL, T3FREE, THYROIDAB in the last 72 hours.  Invalid input(s): FREET3 Anemia work up No results for input(s): VITAMINB12, FOLATE, FERRITIN, TIBC, IRON, RETICCTPCT in the last 72 hours. Urinalysis    Component Value Date/Time   COLORURINE AMBER (A) 01/16/2020 1723   APPEARANCEUR CLOUDY (A) 01/16/2020 1723   APPEARANCEUR Clear 05/19/2014 2245   LABSPEC 1.018 01/16/2020 1723   LABSPEC 1.008 05/19/2014 2245   PHURINE 5.0 01/16/2020 1723   GLUCOSEU NEGATIVE 01/16/2020 1723   GLUCOSEU Negative 05/19/2014 2245   HGBUR NEGATIVE 01/16/2020 1723   BILIRUBINUR SMALL (A) 01/16/2020 1723   BILIRUBINUR Negative 05/19/2014 2245   KETONESUR NEGATIVE 01/16/2020 1723   PROTEINUR 30 (A) 01/16/2020 1723   UROBILINOGEN 1.0 03/05/2011 1312   NITRITE NEGATIVE 01/16/2020 1723   LEUKOCYTESUR MODERATE (A) 01/16/2020 1723   LEUKOCYTESUR Negative 05/19/2014 2245   Sepsis Labs Invalid input(s): PROCALCITONIN,  WBC,  LACTICIDVEN Microbiology Recent Results (from the past 240 hour(s))  SARS CORONAVIRUS 2 (TAT 6-24 HRS) Nasopharyngeal Nasopharyngeal Swab      Status: None   Collection Time: 01/16/20 11:30 PM   Specimen: Nasopharyngeal Swab  Result Value Ref  Range Status   SARS Coronavirus 2 NEGATIVE NEGATIVE Final    Comment: (NOTE) SARS-CoV-2 target nucleic acids are NOT DETECTED. The SARS-CoV-2 RNA is generally detectable in upper and lower respiratory specimens during the acute phase of infection. Negative results do not preclude SARS-CoV-2 infection, do not rule out co-infections with other pathogens, and should not be used as the sole basis for treatment or other patient management decisions. Negative results must be combined with clinical observations, patient history, and epidemiological information. The expected result is Negative. Fact Sheet for Patients: SugarRoll.be Fact Sheet for Healthcare Providers: https://www.woods-mathews.com/ This test is not yet approved or cleared by the [place] FDA and  has been authorized for detection and/or diagnosis of SARS-CoV-2 by FDA under an Emergency Use Authorization (EUA). This EUA will remain  in effect (meaning this test can be used) for the duration of the COVID-19 declaration under Section 56 4(b)(1) of the Act, 21 U.S.C. section 360bbb-3(b)(1), unless the authorization is terminated or revoked sooner. Performed at [facility], [place] [address] [place], [place] [address]   Culture, blood (routine x 2)     Status: None   Collection Time: 01/16/20 11:30 PM   Specimen: BLOOD  Result Value Ref Range Status   Specimen Description BLOOD LEFT ANTECUBITAL  Final   Special Requests   Final    BOTTLES DRAWN AEROBIC AND ANAEROBIC Blood Culture adequate volume   Culture   Final    NO GROWTH 5 DAYS Performed at [facility], [place] [address], [place], [place] [address]    Report Status 01/22/2020 FINAL  Final  Culture, blood (routine x 2)     Status: None   Collection Time: 01/16/20 11:30 PM   Specimen: BLOOD RIGHT FOREARM  Result  Value Ref Range Status   Specimen Description BLOOD RIGHT FOREARM  Final   Special Requests   Final    BOTTLES DRAWN AEROBIC AND ANAEROBIC Blood Culture adequate volume   Culture   Final    NO GROWTH 5 DAYS Performed at [facility], [place] [address], [place], [place] [address]    Report Status 01/22/2020 FINAL  Final  C difficile quick scan w PCR reflex     Status: Abnormal   Collection Time: 01/17/20  3:05 AM   Specimen: STOOL  Result Value Ref Range Status   C Diff antigen POSITIVE (A) NEGATIVE Final   C Diff toxin NEGATIVE NEGATIVE Final   C Diff interpretation Results are indeterminate. See PCR results.  Final    Comment: Performed at [facility], [place] [address], [place], [place] [address]  C. Diff by PCR, Reflexed     Status: Abnormal   Collection Time: 01/17/20  3:05 AM  Result Value Ref Range Status   Toxigenic C. Difficile by PCR POSITIVE (A) NEGATIVE Final    Comment: Positive for toxigenic C. difficile with little to no toxin production. Only treat if clinical presentation suggests symptomatic illness. Performed at [facility], [place] [address] [place], [place] [address]   GI pathogen panel by PCR, stool     Status: None   Collection Time: 01/17/20  3:06 AM   Specimen: STOOL  Result Value Ref Range Status   Plesiomonas shigelloides NOT DETECTED NOT DETECTED Final   Yersinia enterocolitica NOT DETECTED NOT DETECTED Final   Vibrio NOT DETECTED NOT DETECTED Final   Enteropathogenic E coli NOT DETECTED NOT DETECTED Final   E coli (ETEC) LT/ST NOT DETECTED  NOT DETECTED Final   E coli [id number] by PCR Not applicable NOT DETECTED Final   Cryptosporidium by PCR NOT DETECTED NOT DETECTED Final   Entamoeba histolytica NOT DETECTED NOT DETECTED Final   Adenovirus F 40/41 NOT DETECTED NOT DETECTED Final   Norovirus GI/GII NOT DETECTED NOT DETECTED Final   Sapovirus NOT DETECTED NOT DETECTED Final    Comment: (NOTE) Performed At: [facility] [place], [place] [id number] Rush Farmer MD [id number]    Vibrio cholerae NOT DETECTED NOT DETECTED Final   Campylobacter by PCR NOT DETECTED NOT DETECTED Final   Salmonella by PCR NOT DETECTED NOT DETECTED Final   E coli (STEC) NOT DETECTED NOT DETECTED Final   Enteroaggregative E coli NOT DETECTED NOT DETECTED Final   Shigella by PCR NOT DETECTED NOT DETECTED Final   Cyclospora cayetanensis NOT DETECTED NOT DETECTED Final   Astrovirus NOT DETECTED NOT DETECTED Final   G lamblia by PCR NOT DETECTED NOT DETECTED Final   Rotavirus A by PCR NOT DETECTED NOT DETECTED Final  MRSA PCR Screening     Status: None   Collection Time: 01/17/20  4:32 PM   Specimen: Nasal Mucosa; Nasopharyngeal  Result Value Ref Range Status   MRSA by PCR NEGATIVE NEGATIVE Final    Comment:        The GeneXpert MRSA Assay (FDA approved for NASAL specimens only), is one component of a comprehensive MRSA colonization surveillance program. It is not intended to diagnose MRSA infection nor to guide or monitor treatment for MRSA infections. Performed at [facility], [place] [address] [place], [place] [address]   Expectorated sputum assessment w rflx to resp cult     Status: None   Collection Time: 01/18/20  3:53 PM   Specimen: Expectorated Sputum  Result Value Ref Range Status   Specimen Description EXPECTORATED SPUTUM  Final   Special Requests NONE  Final   Sputum evaluation   Final    THIS SPECIMEN IS ACCEPTABLE FOR SPUTUM CULTURE Performed at [facility], [place] [address], [place], [place] [address]    Report Status 01/18/2020 FINAL  Final  Culture, respiratory     Status: None   Collection Time: 01/18/20  3:53 PM  Result Value Ref Range Status   Specimen Description EXPECTORATED SPUTUM  Final   Special Requests NONE Reflexed from [id number]  Final   Gram Stain   Final    RARE WBC PRESENT, PREDOMINANTLY PMN FEW SQUAMOUS EPITHELIAL CELLS PRESENT FEW YEAST RARE GRAM POSITIVE  RODS Performed at [facility], [place] [address], [place], [place] [address]    Culture FEW CANDIDA ALBICANS  Final   Report Status 01/21/2020 FINAL  Final  SARS CORONAVIRUS 2 (TAT 6-24 HRS) Nasopharyngeal Nasopharyngeal Swab     Status: None   Collection Time: 01/21/20  5:05 AM   Specimen: Nasopharyngeal Swab  Result Value Ref Range Status   SARS Coronavirus 2 NEGATIVE NEGATIVE Final    Comment: (NOTE) SARS-CoV-2 target nucleic acids are NOT DETECTED. The SARS-CoV-2 RNA is generally detectable in upper and lower respiratory specimens during the acute phase of infection. Negative results do not preclude SARS-CoV-2 infection, do not rule out co-infections with other pathogens, and should not be used as the sole basis for treatment or other patient management decisions. Negative results must be combined with clinical observations, patient history, and epidemiological information. The expected result is Negative. Fact Sheet for Patients: SugarRoll.be Fact Sheet for Healthcare Providers: https://www.woods-mathews.com/  This test is not yet approved or cleared by the [place] and  has been authorized for detection and/or diagnosis of SARS-CoV-2 by FDA under an Emergency Use Authorization (EUA). This EUA will remain  in effect (meaning this test can be used) for the duration of the COVID-19 declaration under Section 56 4(b)(1) of the Act, 21 U.S.C. section 360bbb-3(b)(1), unless the authorization is terminated or revoked sooner. Performed at [facility], [place] [address] [place], [place] [address]    Time coordinating discharge: Over 30 minutes  SIGNED:  Hoyt Koch, MD  [facility] 01/22/2020, 12:55 PM Pager   If 7PM-7AM, please contact night-coverage [email or web address] Password [email or web address]

## 2020-01-22 NOTE — TOC Transition Note (Addendum)
Transition of Care [facility]) - CM/SW Discharge Note   Patient Details  Name: Sara Pierce MRN: [id number] Date of Birth: [birth year]  [facility] CM/SW Contact:  Alexander Mt, LCSW Phone Number: 01/22/2020, 11:48 AM   Clinical Narrative:    CSW sent referral to [facility] liaison about pt preferred plan to return to SNF with hospice; she was active with palliative services prior to admission. CSW spoke with MD, pt will need signed DNR form, discharge summary and orders. Spoke with pt at bedside- she will call her family when she returns to SNF. Pt eager to get back; expresses gratitude to CSW for care here at hospital.    Final next level of care: Skilled Nursing Facility Barriers to Discharge: Barriers Resolved   Patient Goals and CMS Choice Patient states their goals for this hospitalization and ongoing recovery are:: return to SNF with hospice CMS Medicare.gov Compare Post Acute Care list provided to:: Patient(return to LTC SNF) Choice offered to / list presented to : Patient  Discharge Placement   Existing PASRR number confirmed : 01/18/20          Patient chooses bed at: [facility] Patient to be transferred to facility by: [facility] Name of family member notified: pt responsible for self Patient and family notified of of transfer: 01/22/20  Discharge Plan and Services In-house Referral: Clinical Social Work Discharge Planning Services: CM Consult              Readmission Risk Interventions Readmission Risk Prevention Plan 01/17/2020  Transportation Screening Complete  PCP or Specialist Appt within 3-5 Days Not Complete  Not Complete comments SNF resident  Social Work Consult for [place] Planning/Counseling Complete  Palliative Care Screening Complete  Medication Review Press photographer) Referral to Pharmacy  Some recent data might be hidden

## 2020-03-28 ENCOUNTER — Other Ambulatory Visit: Payer: Self-pay

## 2020-03-28 ENCOUNTER — Non-Acute Institutional Stay: Payer: Medicare Other | Admitting: Hospice

## 2020-03-28 DIAGNOSIS — Z515 Encounter for palliative care: Secondary | ICD-10-CM

## 2020-03-28 DIAGNOSIS — J449 Chronic obstructive pulmonary disease, unspecified: Secondary | ICD-10-CM

## 2020-03-28 DIAGNOSIS — I5032 Chronic diastolic (congestive) heart failure: Secondary | ICD-10-CM

## 2020-03-28 NOTE — Progress Notes (Signed)
[facility] Palliative Care Consult Note Telephone: [phone]  Fax: [phone]  PATIENT NAME: Sara Pierce DOB: [birth year] MRN: [id number]  PRIMARY CARE PROVIDER:   Marygrace Drought, MD  PRIMARY CARE PROVIDER:   Dr. Jules Husbands  REFERRING PROVIDER:  Dr Jules Husbands RESPONSIBLE PARTY:   Self Contact Spouse Waide Parminder [phone]   RECOMMENDATIONS/PLAN:   Advance Care Planning/Goals of Care: Visit consisted of building trust and follow up on palliative care. Patient  is a Full code. Goals of care include to maximize quality of life and symptom management.  Symptom management: Patient coughing during visit, likely related to COPD. Expiratory wheeze noted in bilateral upper lungs. Discussed with [facility] nurse who said this is patient's baseline; coughs often. We went through her medications and patient has different breathing treatments and Guaifenesin. Encouraged for patient to get Albuterol breathing treatment and also Guaifenesin; to monitor closely and call with worsening symptoms. Patient is on diuretics for chronic diastolic CHF. No s/s of CHF exacerbation at this time. She denies chest pain, SOB. Encouraged ongoing nursing care.  Follow up: Palliative care will continue to follow patient for goals of care clarification and symptom management. I spent 50 minutes providing this consultation; time includes chart review and documentation. More than 50% of the time in this consultation was spent on coordinating communication  HISTORY OF PRESENT ILLNESS:  Sara Pierce is a 79 y.o. year old female with multiple medical problems including chronic diastolic CHF, COPD, CKD 3. Palliative Care was asked to help address goals of care.   CODE STATUS: DNR  PPS: 30% HOSPICE ELIGIBILITY/DIAGNOSIS: TBD  PAST MEDICAL HISTORY:  Past Medical History:  Diagnosis Date  . Arthritis   . Cardiomyopathy ([place])   . Cataract   . Cerebral hemorrhage  ([place])   . CHF (congestive heart failure) ([place])   . Chronic kidney disease   . Clotting disorder ([place])   . COPD (chronic obstructive pulmonary disease) ([place])   . Depression   . Diabetes mellitus without complication ([place])   . Hypertension   . Mixed incontinence   . Obesity   . Sleep apnea   . Thyroid disease   . Urinary frequency   . Vaginal atrophy   . Varicose veins     SOCIAL HX:  Social History   Tobacco Use  . Smoking status: Former Smoker    Packs/day: 0.50    Years: 44.00    Pack years: 22.00    Types: Cigarettes  . Smokeless tobacco: Never Used  Substance Use Topics  . Alcohol use: No    ALLERGIES:  Allergies  Allergen Reactions  . Aspirin Swelling  . Codeine Itching  . Naprosyn [Naproxen] Other (See Comments)    Per MAR pt allergic  . Other     Elastic Bandages/ Supports Per MAR  . Tape Hives  . Valacyclovir   . Latex Rash     PERTINENT MEDICATIONS:  Outpatient Encounter Medications as of 03/28/2020  Medication Sig  . acetaminophen (TYLENOL) 325 MG tablet Take 2 tablets (650 mg total) by mouth every 6 (six) hours as needed for mild pain (or Fever >/= 101).  Marland Kitchen albuterol (VENTOLIN HFA) 108 (90 Base) MCG/ACT inhaler Inhale 2 puffs into the lungs every 6 (six) hours as needed for wheezing or shortness of breath.  . B Complex-C (B-COMPLEX WITH VITAMIN C) tablet Take 1 tablet by mouth daily.  . budesonide (PULMICORT) 0.5 MG/2ML nebulizer solution Take 2 mLs (  0.5 mg total) 2 (two) times daily by nebulization. (Patient not taking: Reported on 01/16/2020)  . buPROPion (WELLBUTRIN SR) 100 MG 12 hr tablet Take 100 mg by mouth daily.  . carvedilol (COREG) 3.125 MG tablet Take 3.125 mg by mouth at bedtime.  . clonazePAM (KLONOPIN) 0.5 MG tablet Take 1 tablet (0.5 mg total) by mouth 3 (three) times daily as needed for anxiety.  Marland Kitchen FLUoxetine (PROZAC) 20 MG capsule Take 20 mg by mouth daily. Take with Fluoxetine 40 mg to equal 60 mg  . FLUoxetine (PROZAC) 40 MG capsule Take  40 mg by mouth daily. Take with Fluoxetine 20 mg to equal 60 mg  . fluticasone (FLONASE) 50 MCG/ACT nasal spray Place 2 sprays into both nostrils 2 (two) times daily as needed for allergies.   . furosemide (LASIX) 20 MG tablet Take 1 tablet (20 mg total) by mouth 2 (two) times daily. (Patient not taking: Reported on 01/16/2020)  . gabapentin (NEURONTIN) 100 MG capsule Take 2 capsules (200 mg total) by mouth 3 (three) times daily.  Marland Kitchen guaiFENesin (MUCINEX) 600 MG 12 hr tablet Take 1 tablet (600 mg total) by mouth 2 (two) times daily.  Marland Kitchen guaiFENesin (MUCINEX) 600 MG 12 hr tablet Take 1 tablet (600 mg total) by mouth 2 (two) times daily. (Patient not taking: Reported on 01/16/2020)  . Ipratropium-Albuterol (COMBIVENT RESPIMAT) 20-100 MCG/ACT AERS respimat Inhale 1 puff into the lungs 3 (three) times daily.  Marland Kitchen ipratropium-albuterol (DUONEB) 0.5-2.5 (3) MG/3ML SOLN Take 3 mLs by nebulization 4 (four) times daily. Dx:J44.9 (Patient taking differently: Take 3 mLs by nebulization every 12 (twelve) hours as needed (for cough and wheezing). Dx:J44.9)  . magnesium oxide (MAG-OX) 400 MG tablet Take 400 mg by mouth daily.  . Melatonin 3 MG TABS Take 3 mg by mouth at bedtime.  . mirtazapine (REMERON) 15 MG tablet Take 7.5 mg by mouth at bedtime.  . montelukast (SINGULAIR) 10 MG tablet Take 10 mg by mouth at bedtime.   . ondansetron (ZOFRAN) 4 MG tablet Take 1 tablet (4 mg total) by mouth every 6 (six) hours as needed for nausea.  . polyethylene glycol (MIRALAX / GLYCOLAX) packet Take 17 g by mouth daily.  . potassium chloride SA (KLOR-CON) 20 MEQ tablet Take 20 mEq by mouth daily.  . predniSONE (DELTASONE) 10 MG tablet Take 10 mg by mouth every evening.  . primidone (MYSOLINE) 50 MG tablet Take 50 mg by mouth at bedtime.   . simvastatin (ZOCOR) 40 MG tablet Take 40 mg by mouth at bedtime.   Marland Kitchen spironolactone (ALDACTONE) 25 MG tablet Take 25 mg by mouth daily.  Marland Kitchen torsemide (DEMADEX) 20 MG tablet Take 40 mg by mouth 2  (two) times daily.  Marland Kitchen umeclidinium-vilanterol (ANORO ELLIPTA) 62.5-25 MCG/INH AEPB Inhale 1 puff into the lungs daily.   No facility-administered encounter medications on file as of 03/28/2020.    PHYSICAL EXAM/ROS:   General: NAD, ooperative Cardiovascular: regular rate and rhythm; denies chest pain/discomfort Pulmonary: expiratory wheeze auscultated in upper lobes bilaterally, on 02 4L/Min, coughing - non productive. Normal respiratory effort.  Abdomen: soft, nontender, + bowel sounds GU: no suprapubic tenderness Extremities: no edema Skin: no rashes to exposed skin Neurological: Weakness but otherwise nonfocal  Teodoro Spray, NP

## 2020-04-25 IMAGING — DX DG CHEST 1V PORT
1 series · 1 of 1 positions shown · non-contrast
Comparison: 04/21/2018.

CLINICAL DATA: Shortness of breath today. Ex-smoker.

EXAM:
PORTABLE CHEST 1 VIEW

[chest ap]
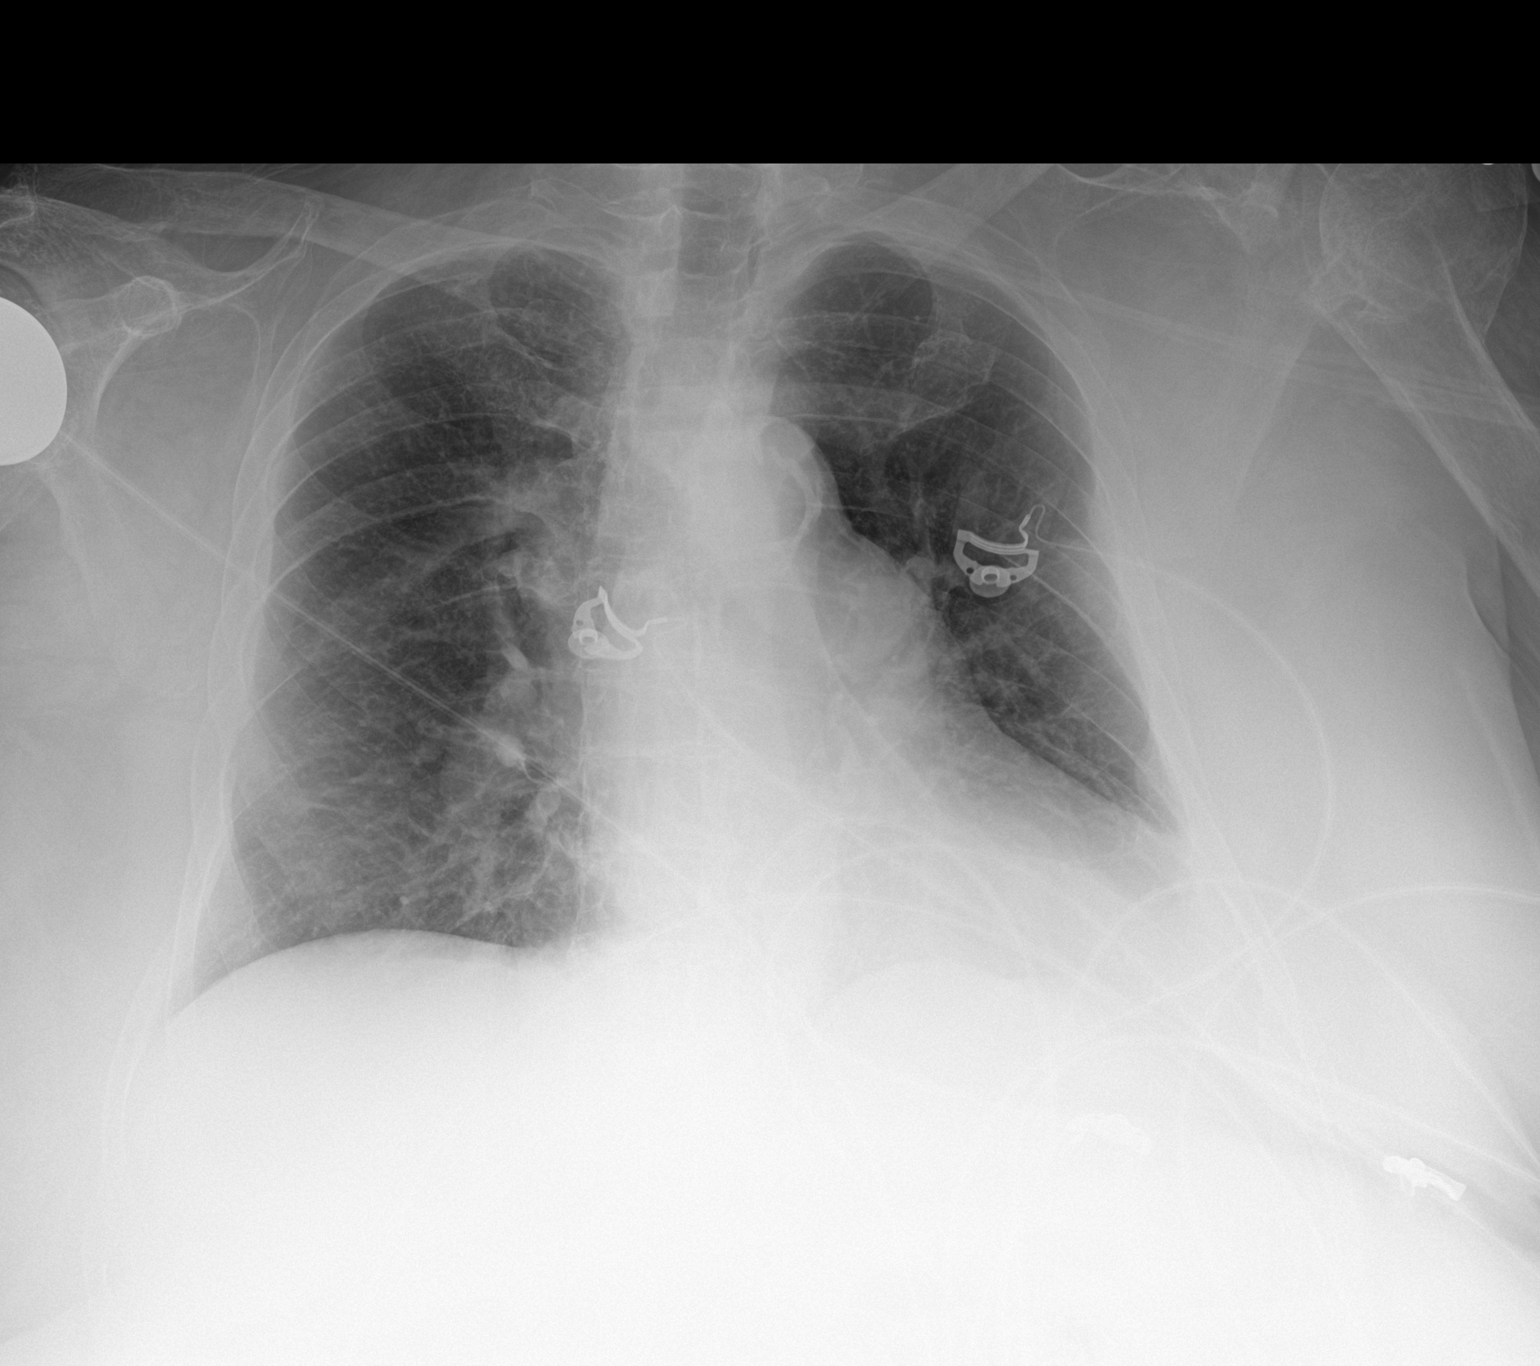

[1 of 1 positions shown; findings below may reference images not displayed]

FINDINGS: Stable borderline enlarged cardiac silhouette. Clear lungs with
stable mildly prominent interstitial markings. Right humeral head
prosthesis. Stable old left humeral neck fracture. Atheromatous
aortic calcifications.
IMPRESSION: No acute abnormality. Stable borderline cardiomegaly and mild
chronic interstitial lung disease.

## 2020-05-08 ENCOUNTER — Non-Acute Institutional Stay: Payer: Medicare Other | Admitting: Hospice

## 2020-05-08 ENCOUNTER — Other Ambulatory Visit: Payer: Self-pay

## 2020-05-08 DIAGNOSIS — Z515 Encounter for palliative care: Secondary | ICD-10-CM

## 2020-05-08 NOTE — Progress Notes (Signed)
[facility] Palliative Care Consult Note Telephone: [phone]  Fax: [phone]  PATIENT NAME: Sara Pierce DOB: [birth year] MRN: [id number]  PRIMARY CARE PROVIDER:   Marygrace Drought, MD  PRIMARY CARE PROVIDER:Dr. Jules Husbands  REFERRING PROVIDER:Dr Jules Husbands RESPONSIBLE PARTY:Self Contact Spouse Halderman Parminder [phone]   RECOMMENDATIONS/PLAN:  Advance Care Planning/Goals of Care: Visit consisted of building trust and follow up on palliative care.Patient  is a Full code.Goals of care include to maximize quality of life and symptom management.  Visit consisted of counseling and education dealing with the complex and emotionally intense issues of symptom management and palliative care in the setting of serious and potentially life-threatening illness.  Patient expressed appreciation for palliative care.  Palliative care team will continue to support patient, patient's family, and medical team. Symptom management:Patient in no respiratory distress today; no coughing, no work of breathing.  Patient is compliant with her breathing treatments and continues  on diuretics for chronic diastolic CHF. No s/s of CHF exacerbation at this time. She denies chest pain, no SOB.  No adventitious lung sounds auscultated.  Nursing staff with no concerns.  Encouraged ongoing nursing care. Follow [id number] care will continue to follow patient for goals of care clarification and symptom management. I spent55minutes providing this consultation; time includes chart review and documentation.More than 50% of the time in this consultation was spent on coordinating communication  [place] a 79 y.o.year oldfemalewith multiple medical problems including chronic diastolic CHF, COPD, CKD 3. Palliative Care was asked to help address goals of care.   CODE STATUS: DNR  PPS: 30% HOSPICE ELIGIBILITY/DIAGNOSIS:  TBD  PAST MEDICAL HISTORY:  Past Medical History:  Diagnosis Date  . Arthritis   . Cardiomyopathy ([place])   . Cataract   . Cerebral hemorrhage ([place])   . CHF (congestive heart failure) ([place])   . Chronic kidney disease   . Clotting disorder ([place])   . COPD (chronic obstructive pulmonary disease) ([place])   . Depression   . Diabetes mellitus without complication ([place])   . Hypertension   . Mixed incontinence   . Obesity   . Sleep apnea   . Thyroid disease   . Urinary frequency   . Vaginal atrophy   . Varicose veins     SOCIAL HX:  Social History   Tobacco Use  . Smoking status: Former Smoker    Packs/day: 0.50    Years: 44.00    Pack years: 22.00    Types: Cigarettes  . Smokeless tobacco: Never Used  Substance Use Topics  . Alcohol use: No    ALLERGIES:  Allergies  Allergen Reactions  . Aspirin Swelling  . Codeine Itching  . Naprosyn [Naproxen] Other (See Comments)    Per MAR pt allergic  . Other     Elastic Bandages/ Supports Per MAR  . Tape Hives  . Valacyclovir   . Latex Rash     PERTINENT MEDICATIONS:  Outpatient Encounter Medications as of 05/08/2020  Medication Sig  . acetaminophen (TYLENOL) 325 MG tablet Take 2 tablets (650 mg total) by mouth every 6 (six) hours as needed for mild pain (or Fever >/= 101).  Marland Kitchen albuterol (VENTOLIN HFA) 108 (90 Base) MCG/ACT inhaler Inhale 2 puffs into the lungs every 6 (six) hours as needed for wheezing or shortness of breath.  . B Complex-C (B-COMPLEX WITH VITAMIN C) tablet Take 1 tablet by mouth daily.  . budesonide (PULMICORT) 0.5 MG/2ML nebulizer solution Take  2 mLs (0.5 mg total) 2 (two) times daily by nebulization. (Patient not taking: Reported on 01/16/2020)  . buPROPion (WELLBUTRIN SR) 100 MG 12 hr tablet Take 100 mg by mouth daily.  . carvedilol (COREG) 3.125 MG tablet Take 3.125 mg by mouth at bedtime.  . clonazePAM (KLONOPIN) 0.5 MG tablet Take 1 tablet (0.5 mg total) by mouth 3 (three) times daily as needed for anxiety.   Marland Kitchen FLUoxetine (PROZAC) 20 MG capsule Take 20 mg by mouth daily. Take with Fluoxetine 40 mg to equal 60 mg  . FLUoxetine (PROZAC) 40 MG capsule Take 40 mg by mouth daily. Take with Fluoxetine 20 mg to equal 60 mg  . fluticasone (FLONASE) 50 MCG/ACT nasal spray Place 2 sprays into both nostrils 2 (two) times daily as needed for allergies.   . furosemide (LASIX) 20 MG tablet Take 1 tablet (20 mg total) by mouth 2 (two) times daily. (Patient not taking: Reported on 01/16/2020)  . gabapentin (NEURONTIN) 100 MG capsule Take 2 capsules (200 mg total) by mouth 3 (three) times daily.  Marland Kitchen guaiFENesin (MUCINEX) 600 MG 12 hr tablet Take 1 tablet (600 mg total) by mouth 2 (two) times daily.  Marland Kitchen guaiFENesin (MUCINEX) 600 MG 12 hr tablet Take 1 tablet (600 mg total) by mouth 2 (two) times daily. (Patient not taking: Reported on 01/16/2020)  . Ipratropium-Albuterol (COMBIVENT RESPIMAT) 20-100 MCG/ACT AERS respimat Inhale 1 puff into the lungs 3 (three) times daily.  Marland Kitchen ipratropium-albuterol (DUONEB) 0.5-2.5 (3) MG/3ML SOLN Take 3 mLs by nebulization 4 (four) times daily. Dx:J44.9 (Patient taking differently: Take 3 mLs by nebulization every 12 (twelve) hours as needed (for cough and wheezing). Dx:J44.9)  . magnesium oxide (MAG-OX) 400 MG tablet Take 400 mg by mouth daily.  . Melatonin 3 MG TABS Take 3 mg by mouth at bedtime.  . mirtazapine (REMERON) 15 MG tablet Take 7.5 mg by mouth at bedtime.  . montelukast (SINGULAIR) 10 MG tablet Take 10 mg by mouth at bedtime.   . ondansetron (ZOFRAN) 4 MG tablet Take 1 tablet (4 mg total) by mouth every 6 (six) hours as needed for nausea.  . polyethylene glycol (MIRALAX / GLYCOLAX) packet Take 17 g by mouth daily.  . potassium chloride SA (KLOR-CON) 20 MEQ tablet Take 20 mEq by mouth daily.  . predniSONE (DELTASONE) 10 MG tablet Take 10 mg by mouth every evening.  . primidone (MYSOLINE) 50 MG tablet Take 50 mg by mouth at bedtime.   . simvastatin (ZOCOR) 40 MG tablet Take 40 mg by  mouth at bedtime.   Marland Kitchen spironolactone (ALDACTONE) 25 MG tablet Take 25 mg by mouth daily.  Marland Kitchen torsemide (DEMADEX) 20 MG tablet Take 40 mg by mouth 2 (two) times daily.  Marland Kitchen umeclidinium-vilanterol (ANORO ELLIPTA) 62.5-25 MCG/INH AEPB Inhale 1 puff into the lungs daily.   No facility-administered encounter medications on file as of 05/08/2020.    Teodoro Spray, NP

## 2020-05-20 ENCOUNTER — Other Ambulatory Visit: Payer: Self-pay

## 2020-05-20 ENCOUNTER — Emergency Department (HOSPITAL_COMMUNITY): Payer: Medicare Other

## 2020-05-20 ENCOUNTER — Emergency Department (HOSPITAL_COMMUNITY)
Admission: EM | Admit: 2020-05-20 | Discharge: 2020-05-21 | Disposition: A | Discharge status: Home / Self Care | Payer: Medicare Other | Attending: Emergency Medicine | Admitting: Emergency Medicine

## 2020-05-20 ENCOUNTER — Encounter (HOSPITAL_COMMUNITY): Payer: Self-pay

## 2020-05-20 DIAGNOSIS — Z79899 Other long term (current) drug therapy: Secondary | ICD-10-CM | POA: Insufficient documentation

## 2020-05-20 DIAGNOSIS — Z87891 Personal history of nicotine dependence: Secondary | ICD-10-CM | POA: Diagnosis not present

## 2020-05-20 DIAGNOSIS — I130 Hypertensive heart and chronic kidney disease with heart failure and stage 1 through stage 4 chronic kidney disease, or unspecified chronic kidney disease: Secondary | ICD-10-CM | POA: Insufficient documentation

## 2020-05-20 DIAGNOSIS — N189 Chronic kidney disease, unspecified: Secondary | ICD-10-CM | POA: Diagnosis not present

## 2020-05-20 DIAGNOSIS — R0989 Other specified symptoms and signs involving the circulatory and respiratory systems: Secondary | ICD-10-CM | POA: Insufficient documentation

## 2020-05-20 DIAGNOSIS — I5032 Chronic diastolic (congestive) heart failure: Secondary | ICD-10-CM | POA: Diagnosis not present

## 2020-05-20 DIAGNOSIS — E1122 Type 2 diabetes mellitus with diabetic chronic kidney disease: Secondary | ICD-10-CM | POA: Diagnosis not present

## 2020-05-20 DIAGNOSIS — Z91040 Latex allergy status: Secondary | ICD-10-CM | POA: Diagnosis not present

## 2020-05-20 DIAGNOSIS — J449 Chronic obstructive pulmonary disease, unspecified: Secondary | ICD-10-CM | POA: Insufficient documentation

## 2020-05-20 LAB — CBC WITH DIFFERENTIAL/PLATELET
Abs Immature Granulocytes: 0.22 10*3/uL — ABNORMAL HIGH (ref 0.00–0.07)
Basophils Absolute: 0.1 10*3/uL (ref 0.0–0.1)
Basophils Relative: 0 %
Eosinophils Absolute: 0.1 10*3/uL (ref 0.0–0.5)
Eosinophils Relative: 1 %
HCT: 39.9 % (ref 36.0–46.0)
Hemoglobin: 12.4 g/dL (ref 12.0–15.0)
Immature Granulocytes: 2 %
Lymphocytes Relative: 21 %
Lymphs Abs: 2.9 10*3/uL (ref 0.7–4.0)
MCH: 31.5 pg (ref 26.0–34.0)
MCHC: 31.1 g/dL (ref 30.0–36.0)
MCV: 101.3 fL — ABNORMAL HIGH (ref 80.0–100.0)
Monocytes Absolute: 0.9 10*3/uL (ref 0.1–1.0)
Monocytes Relative: 7 %
Neutro Abs: 9.9 10*3/uL — ABNORMAL HIGH (ref 1.7–7.7)
Neutrophils Relative %: 69 %
Platelets: 216 10*3/uL (ref 150–400)
RBC: 3.94 MIL/uL (ref 3.87–5.11)
RDW: 16 % — ABNORMAL HIGH (ref 11.5–15.5)
WBC: 14.1 10*3/uL — ABNORMAL HIGH (ref 4.0–10.5)
nRBC: 0.2 % (ref 0.0–0.2)

## 2020-05-20 LAB — BASIC METABOLIC PANEL
Anion gap: 12 (ref 5–15)
BUN: 36 mg/dL — ABNORMAL HIGH (ref 8–23)
CO2: 35 mmol/L — ABNORMAL HIGH (ref 22–32)
Calcium: 8.7 mg/dL — ABNORMAL LOW (ref 8.9–10.3)
Chloride: 89 mmol/L — ABNORMAL LOW (ref 98–111)
Creatinine, Ser: 1.1 mg/dL — ABNORMAL HIGH (ref 0.44–1.00)
GFR calc Af Amer: 56 mL/min — ABNORMAL LOW (ref >60)
GFR calc non Af Amer: 48 mL/min — ABNORMAL LOW (ref >60)
Glucose, Bld: 122 mg/dL — ABNORMAL HIGH (ref 70–99)
Potassium: 4.3 mmol/L (ref 3.5–5.1)
Sodium: 136 mmol/L (ref 135–145)

## 2020-05-20 MED ORDER — IOHEXOL 300 MG/ML  SOLN
75.0000 mL | Freq: Once | INTRAMUSCULAR | Status: AC | PRN
  Administered 2020-05-20: 75 mL via INTRAVENOUS

## 2020-05-20 MED ORDER — SODIUM CHLORIDE (PF) 0.9 % IJ SOLN
INTRAMUSCULAR | Status: AC
  Filled 2020-05-20: qty 50

## 2020-05-20 NOTE — ED Triage Notes (Signed)
Pt is from [place] and the staff states that she was eating a cheeseburger and part of it got stuck in her throat, they state that she desatted to 79% and was cyanotic, EMS reports that she was satting at 96% and was red faced Pt states that she can't get it out of her throat

## 2020-05-20 NOTE — ED Provider Notes (Signed)
[facility] DEPT Provider Note   CSN: [id number] Arrival date & time: 05/20/20  1912     History Chief Complaint  Patient presents with  . food in throat    Sara Pierce is a 79 y.o. female.  Patient presents from nursing facility after having a choking episode earlier tonight.  Patient was eating a cheeseburger and began choking and could not swallow it.  Per EMS report, patient was 79% on room air and cyanotic.  EMS reports patient at 96% with a red face upon their arrival.  Patient states that now she is feeling better.  She is not coughing or having any shortness of breath.  She denies any chest pain.  She states that she is feeling back to her baseline.  No h/o EGD in Epic.         Past Medical History:  Diagnosis Date  . Arthritis   . Cardiomyopathy ([place])   . Cataract   . Cerebral hemorrhage ([place])   . CHF (congestive heart failure) ([place])   . Chronic kidney disease   . Clotting disorder ([place])   . COPD (chronic obstructive pulmonary disease) ([place])   . Depression   . Diabetes mellitus without complication ([place])   . Hypertension   . Mixed incontinence   . Obesity   . Sleep apnea   . Thyroid disease   . Urinary frequency   . Vaginal atrophy   . Varicose veins     Patient Active Problem List   Diagnosis Date Noted  . Shortness of breath   . Palliative care by specialist   . DNR (do not resuscitate)   . Acute on chronic renal insufficiency   . Adult failure to thrive   . C. difficile colitis 01/16/2020  . Perirectal abscess 06/02/2018  . Chronic diastolic heart failure ([place]) 05/12/2018  . NSTEMI (non-ST elevated myocardial infarction) ([place]) 04/15/2018  . COPD (chronic obstructive pulmonary disease) ([place]) 01/04/2018  . Pressure injury of skin 10/25/2017  . Absolute anemia 03/15/2015  . Anxiety 03/15/2015  . Arthritis 03/15/2015  . Cataract 03/15/2015  . Chronic constipation 03/15/2015  . Blood clotting disorder ([place])  03/15/2015  . CAFL (chronic airflow limitation) ([place]) 03/15/2015  . Clinical depression 03/15/2015  . Diabetes ([place]) 03/15/2015  . H/O varicella 03/15/2015  . BP (high blood pressure) 03/15/2015  . Mixed incontinence 03/15/2015  . 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 Adiposity 03/15/2015  . Apnea, sleep 03/15/2015  . Disease of thyroid gland 03/15/2015  . FOM (frequency of micturition) 03/15/2015  . 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 Candida vaginitis 03/15/2015  . Abnormal ECG 03/15/2015    Past Surgical History:  Procedure Laterality Date  . APPENDECTOMY    . Cardiac Catherization    . COLONOSCOPY WITH PROPOFOL N/A 04/28/2016   Procedure: COLONOSCOPY WITH PROPOFOL;  Surgeon: Manya Silvas, MD;  Location[facility] END[facility]COPY;  Service: Endoscopy;  Laterality: N/A;  . COLONOSCOPY WITH PROPOFOL N/A 04/29/2016   Procedure: COLONOSCOPY WITH PROPOFOL;  Surgeon: Manya Silvas, MD;  Location: [facility] ENDOSCOPY;  Service: Endoscopy;  Laterality: N/A;  . JOINT REPLACEMENT    . STRABISMUS SURGERY    . TONSILLECTOMY    . TUBAL LIGATION       OB History   No obstetric history on file.     Family History  Problem Relation Age of Onset  . Heart attack Father   . Coronary artery disease Father   .  Hypertension Father   . Hyperlipidemia Father   . Heart attack Mother   . Hypertension Mother   . Hyperlipidemia Mother   . Breast cancer Neg Hx     Social History   Tobacco Use  . Smoking status: Former Smoker    Packs/day: 0.50    Years: 44.00    Pack years: 22.00    Types: Cigarettes  . Smokeless tobacco: Never Used  Substance Use Topics  . Alcohol use: No  . Drug use: No    Home Medications Prior to Admission medications   Medication Sig Start Date End Date Taking? Authorizing Provider  acetaminophen (TYLENOL) 325 MG tablet Take 2 tablets (650 mg total) by mouth every 6 (six) hours as needed for mild pain (or Fever >/= 101). 04/22/18    Gouru, Illene Silver, MD  albuterol (VENTOLIN HFA) 108 (90 Base) MCG/ACT inhaler Inhale 2 puffs into the lungs every 6 (six) hours as needed for wheezing or shortness of breath.    [provider]  B Complex-C (B-COMPLEX WITH VITAMIN C) tablet Take 1 tablet by mouth daily.    [provider]  budesonide (PULMICORT) 0.5 MG/2ML nebulizer solution Take 2 mLs (0.5 mg total) 2 (two) times daily by nebulization. Patient not taking: Reported on 01/16/2020 10/28/17   Demetrios Loll, MD  buPROPion [facility] SR) 100 MG 12 hr tablet Take 100 mg by mouth daily.    [provider]  carvedilol (COREG) 3.125 MG tablet Take 3.125 mg by mouth at bedtime.    [provider]  clonazePAM (KLONOPIN) 0.5 MG tablet Take 1 tablet (0.5 mg total) by mouth 3 (three) times daily as needed for anxiety. 01/22/20   Hoyt Koch, MD  FLUoxetine (PROZAC) 20 MG capsule Take 20 mg by mouth daily. Take with Fluoxetine 40 mg to equal 60 mg    [provider]  FLUoxetine (PROZAC) 40 MG capsule Take 40 mg by mouth daily. Take with Fluoxetine 20 mg to equal 60 mg    [provider]  fluticasone (FLONASE) 50 MCG/ACT nasal spray Place 2 sprays into both nostrils 2 (two) times daily as needed for allergies.     [provider]  furosemide (LASIX) 20 MG tablet Take 1 tablet (20 mg total) by mouth 2 (two) times daily. Patient not taking: Reported on 01/16/2020 04/22/18   Nicholes Mango, MD  gabapentin (NEURONTIN) 100 MG capsule Take 2 capsules (200 mg total) by mouth 3 (three) times daily. 04/22/18   Nicholes Mango, MD  guaiFENesin (MUCINEX) 600 MG 12 hr tablet Take 1 tablet (600 mg total) by mouth 2 (two) times daily. 04/22/18   Nicholes Mango, MD  guaiFENesin (MUCINEX) 600 MG 12 hr tablet Take 1 tablet (600 mg total) by mouth 2 (two) times daily. Patient not taking: Reported on 01/16/2020 07/27/18   Laverle Hobby, MD  Ipratropium-Albuterol (COMBIVENT RESPIMAT) 20-100 MCG/ACT AERS respimat  Inhale 1 puff into the lungs 3 (three) times daily.    [provider]  ipratropium-albuterol (DUONEB) 0.5-2.5 (3) MG/3ML SOLN Take 3 mLs by nebulization 4 (four) times daily. Dx:J44.9 Patient taking differently: Take 3 mLs by nebulization every 12 (twelve) hours as needed (for cough and wheezing). Dx:J44.9 07/27/18   Laverle Hobby, MD  magnesium oxide (MAG-OX) 400 MG tablet Take 400 mg by mouth daily.    [provider]  Melatonin 3 MG TABS Take 3 mg by mouth at bedtime.    [provider]  mirtazapine (REMERON) 15 MG tablet Take 7.5  mg by mouth at bedtime.    [provider]  montelukast (SINGULAIR) 10 MG tablet Take 10 mg by mouth at bedtime.     [provider]  ondansetron (ZOFRAN) 4 MG tablet Take 1 tablet (4 mg total) by mouth every 6 (six) hours as needed for nausea. 01/22/20   Hoyt Koch, MD  polyethylene glycol [facility] / Floria Raveling) packet Take 17 g by mouth daily.    [provider]  potassium chloride SA (KLOR-CON) 20 MEQ tablet Take 20 mEq by mouth daily.    [provider]  predniSONE (DELTASONE) 10 MG tablet Take 10 mg by mouth every evening.    [provider]  primidone (MYSOLINE) 50 MG tablet Take 50 mg by mouth at bedtime.     [provider]  simvastatin (ZOCOR) 40 MG tablet Take 40 mg by mouth at bedtime.     [provider]  spironolactone (ALDACTONE) 25 MG tablet Take 25 mg by mouth daily.    [provider]  torsemide (DEMADEX) 20 MG tablet Take 40 mg by mouth 2 (two) times daily.    [provider]  umeclidinium-vilanterol (ANORO ELLIPTA) 62.5-25 MCG/INH AEPB Inhale 1 puff into the lungs daily.    [provider]    Allergies    Aspirin, Codeine, Naprosyn [naproxen], Other, Tape, Valacyclovir, and Latex  Review of Systems   Review of Systems  Constitutional: Negative for fever.  HENT: Positive for trouble swallowing. Negative for  rhinorrhea and sore throat.   Eyes: Negative for redness.  Respiratory: Positive for choking and shortness of breath. Negative for cough.   Cardiovascular: Negative for chest pain.  Gastrointestinal: Negative for abdominal pain, diarrhea, nausea and vomiting.  Genitourinary: Negative for dysuria.  Musculoskeletal: Negative for myalgias.  Skin: Positive for color change. Negative for rash.  Neurological: Negative for headaches.    Physical Exam Updated Vital Signs BP 115/67 (BP Location: Left Arm)   Pulse 63   Temp 98.6 F (37 C) (Oral)   Resp 18   SpO2 97%   Physical Exam Vitals and nursing note reviewed.  Constitutional:      Appearance: She is well-developed.  HENT:     Head: Normocephalic and atraumatic.     Mouth/Throat:     Mouth: Mucous membranes are moist.     Pharynx: No posterior oropharyngeal erythema.  Eyes:     General:        Right eye: No discharge.        Left eye: No discharge.     Conjunctiva/sclera: Conjunctivae normal.  Cardiovascular:     Rate and Rhythm: Normal rate and regular rhythm.     Heart sounds: Normal heart sounds.  Pulmonary:     Effort: Pulmonary effort is normal.     Breath sounds: No stridor. Rhonchi (scattered) present.     Comments: Transmitted upper airway sounds Abdominal:     Palpations: Abdomen is soft.     Tenderness: There is no abdominal tenderness.  Musculoskeletal:     Cervical back: Normal range of motion and neck supple.  Skin:    General: Skin is warm and dry.  Neurological:     Mental Status: She is alert.     ED Results / Procedures / Treatments   Labs (all labs ordered are listed, but only abnormal results are displayed) Labs Reviewed  CBC WITH DIFFERENTIAL/PLATELET - Abnormal; Notable for the following components:      Result Value   WBC 14.1 (*)  MCV 101.3 (*)    RDW 16.0 (*)    Neutro Abs 9.9 (*)    Abs Immature Granulocytes 0.22 (*)    All other components within normal limits  BASIC METABOLIC  PANEL - Abnormal; Notable for the following components:   Chloride 89 (*)    CO2 35 (*)    Glucose, Bld 122 (*)    BUN 36 (*)    Creatinine, Ser 1.10 (*)    Calcium 8.7 (*)    GFR calc non Af Amer 48 (*)    GFR calc Af Amer 56 (*)    All other components within normal limits    EKG None  Radiology DG Chest 2 View  Result Date: 05/20/2020 CLINICAL DATA:  Choking episode. EXAM: CHEST - 2 VIEW COMPARISON:  Chest radiograph 01/20/2020.  CT 04/17/2018 FINDINGS: Low lung volumes. Unchanged heart size and mediastinal contours. Small left pleural effusions suspected, may be chronic. Scattered bilateral subsegmental atelectasis. No pneumothorax or evidence of pneumomediastinum. Right proximal humeral arthroplasty. Chronic deformity of the left proximal humerus. Bones are diffusely under mineralized. IMPRESSION: 1. Low lung volumes with scattered subsegmental atelectasis. 2. Small left pleural effusion suspected, may be chronic. Electronically Signed   By: Keith Rake M.D.   On: 05/20/2020 20:51    Procedures Procedures (including critical care time)  Medications Ordered in ED Medications - No data to display  ED Course  I have reviewed the triage vital signs and the nursing notes.  Pertinent labs & imaging results that were available during my care of the patient were reviewed by me and considered in my medical decision making (see chart for details).  Patient seen and examined. Pt looks comfortable, tolerating secretions. Will check CXR. Will perform PO trial if this is reassuring.   Vital signs reviewed and are as follows: BP 115/67 (BP Location: Left Arm)   Pulse 63   Temp 98.6 F (37 C) (Oral)   Resp 18   SpO2 97%   12:14 AM patient discussed with and seen earlier by Dr. Sedonia Small.  After discussion with patient, patient with extra reassurance that nothing is stuck in her throat.  CT of the neck ordered.  At shift change, patient care signed out to [facility].  Plan: Follow-up  on CT results.  If no abnormalities, fluid challenge.  If she passes, she can go home with PCP follow-up as needed.    MDM Rules/Calculators/A&P                      Patient with choking episode earlier tonight.  X-ray was clear without signs of aspiration or obstruction.  Pending CT of the neck.  Lung sounds are clear bilaterally.  Patient is handling her secretions.  Low concern for esophageal food impaction.  Final Clinical Impression(s) / ED Diagnoses Final diagnoses:  Choking episode    Rx / DC Orders ED Discharge Orders    None       Carlisle Cater, PA-C 05/21/20 0017    Maudie Flakes, MD 05/21/20 [phone]

## 2020-05-21 NOTE — Discharge Instructions (Signed)
Please read and follow all provided instructions.  Your diagnoses today include:  1. Choking episode     Tests performed today include:  Chest x-ray - was okay  CT scan of your neck  Blood counts and electrolytes -slightly weak kidney function, please have this rechecked by your doctor and drink plenty fluids  Vital signs. See below for your results today.   Medications prescribed:   None  Take any prescribed medications only as directed.  Home care instructions:  Follow any educational materials contained in this packet.  BE VERY CAREFUL not to take multiple medicines containing Tylenol (also called acetaminophen). Doing so can lead to an overdose which can damage your liver and cause liver failure and possibly death.   Follow-up instructions: Please follow-up with your primary care provider in the next 3 days for further evaluation of your symptoms if not improved.   Return instructions:   Please return to the Emergency Department if you experience worsening symptoms.   Please return if you have any other emergent concerns.  Additional Information:  Your vital signs today were: BP 137/72   Pulse 61   Temp 98.6 F (37 C) (Oral)   Resp 15   SpO2 100%  If your blood pressure (BP) was elevated above 135/85 this visit, please have this repeated by your doctor within one month. --------------

## 2020-05-21 NOTE — ED Notes (Signed)
[facility] called  

## 2020-05-21 NOTE — ED Provider Notes (Signed)
Patient care signed out at end of shift from [facility], [place], after evaluation of choking episode while eating earlier tonight. NF reports cyanosis, resolved on arrival to ED Pending CT neck  Will need PO challenge, review of CT  12:45 - CT negative for any FB or airway or digestive tract abnormality. I gave her PO challenge with water which she drank with difficulty or pain. She is felt appropriate for discharge home per plan of previous treatment team.    Charlann Lange, PA-C 05/21/20 0056    Maudie Flakes, MD 05/21/20 [phone]

## 2020-05-21 NOTE — ED Notes (Signed)
Patient's linen and diaper changed; Purewick replaced

## 2020-07-21 ENCOUNTER — Other Ambulatory Visit: Payer: Self-pay | Admitting: Family Medicine

## 2020-07-21 DIAGNOSIS — R918 Other nonspecific abnormal finding of lung field: Secondary | ICD-10-CM

## 2020-08-05 ENCOUNTER — Encounter: Payer: Self-pay | Admitting: Registered Nurse

## 2020-08-05 ENCOUNTER — Encounter
Admission: EM | Disposition: A | Discharge status: Skilled Nursing Facility | Payer: Self-pay | Source: Skilled Nursing Facility | Attending: Internal Medicine

## 2020-08-05 ENCOUNTER — Emergency Department: Payer: Medicare Other

## 2020-08-05 ENCOUNTER — Other Ambulatory Visit: Payer: Self-pay

## 2020-08-05 ENCOUNTER — Inpatient Hospital Stay
Admission: EM | Admit: 2020-08-05 | Discharge: 2020-08-12 | DRG: 480 | Disposition: A | Discharge status: Skilled Nursing Facility | Payer: Medicare Other | Source: Skilled Nursing Facility | Attending: Internal Medicine | Admitting: Internal Medicine

## 2020-08-05 DIAGNOSIS — I959 Hypotension, unspecified: Secondary | ICD-10-CM | POA: Diagnosis not present

## 2020-08-05 DIAGNOSIS — R251 Tremor, unspecified: Secondary | ICD-10-CM | POA: Diagnosis present

## 2020-08-05 DIAGNOSIS — E669 Obesity, unspecified: Secondary | ICD-10-CM | POA: Diagnosis present

## 2020-08-05 DIAGNOSIS — M62838 Other muscle spasm: Secondary | ICD-10-CM | POA: Diagnosis not present

## 2020-08-05 DIAGNOSIS — M80052A Age-related osteoporosis with current pathological fracture, left femur, initial encounter for fracture: Principal | ICD-10-CM

## 2020-08-05 DIAGNOSIS — S72002A Fracture of unspecified part of neck of left femur, initial encounter for closed fracture: Secondary | ICD-10-CM | POA: Diagnosis not present

## 2020-08-05 DIAGNOSIS — J449 Chronic obstructive pulmonary disease, unspecified: Secondary | ICD-10-CM | POA: Diagnosis not present

## 2020-08-05 DIAGNOSIS — E785 Hyperlipidemia, unspecified: Secondary | ICD-10-CM | POA: Diagnosis present

## 2020-08-05 DIAGNOSIS — I4510 Unspecified right bundle-branch block: Secondary | ICD-10-CM | POA: Diagnosis present

## 2020-08-05 DIAGNOSIS — I5032 Chronic diastolic (congestive) heart failure: Secondary | ICD-10-CM | POA: Diagnosis present

## 2020-08-05 DIAGNOSIS — J441 Chronic obstructive pulmonary disease with (acute) exacerbation: Secondary | ICD-10-CM | POA: Diagnosis present

## 2020-08-05 DIAGNOSIS — F329 Major depressive disorder, single episode, unspecified: Secondary | ICD-10-CM | POA: Diagnosis present

## 2020-08-05 DIAGNOSIS — R059 Cough, unspecified: Secondary | ICD-10-CM

## 2020-08-05 DIAGNOSIS — J9621 Acute and chronic respiratory failure with hypoxia: Secondary | ICD-10-CM | POA: Diagnosis present

## 2020-08-05 DIAGNOSIS — Z91048 Other nonmedicinal substance allergy status: Secondary | ICD-10-CM

## 2020-08-05 DIAGNOSIS — S72009A Fracture of unspecified part of neck of unspecified femur, initial encounter for closed fracture: Secondary | ICD-10-CM

## 2020-08-05 DIAGNOSIS — Z87891 Personal history of nicotine dependence: Secondary | ICD-10-CM

## 2020-08-05 DIAGNOSIS — D689 Coagulation defect, unspecified: Secondary | ICD-10-CM | POA: Diagnosis present

## 2020-08-05 DIAGNOSIS — E1142 Type 2 diabetes mellitus with diabetic polyneuropathy: Secondary | ICD-10-CM | POA: Diagnosis present

## 2020-08-05 DIAGNOSIS — E1122 Type 2 diabetes mellitus with diabetic chronic kidney disease: Secondary | ICD-10-CM | POA: Diagnosis present

## 2020-08-05 DIAGNOSIS — I428 Other cardiomyopathies: Secondary | ICD-10-CM | POA: Diagnosis present

## 2020-08-05 DIAGNOSIS — R05 Cough: Secondary | ICD-10-CM | POA: Diagnosis present

## 2020-08-05 DIAGNOSIS — Z885 Allergy status to narcotic agent status: Secondary | ICD-10-CM

## 2020-08-05 DIAGNOSIS — I130 Hypertensive heart and chronic kidney disease with heart failure and stage 1 through stage 4 chronic kidney disease, or unspecified chronic kidney disease: Secondary | ICD-10-CM | POA: Diagnosis present

## 2020-08-05 DIAGNOSIS — G4733 Obstructive sleep apnea (adult) (pediatric): Secondary | ICD-10-CM | POA: Diagnosis present

## 2020-08-05 DIAGNOSIS — E039 Hypothyroidism, unspecified: Secondary | ICD-10-CM | POA: Diagnosis not present

## 2020-08-05 DIAGNOSIS — D509 Iron deficiency anemia, unspecified: Secondary | ICD-10-CM | POA: Diagnosis present

## 2020-08-05 DIAGNOSIS — Z91040 Latex allergy status: Secondary | ICD-10-CM

## 2020-08-05 DIAGNOSIS — J9611 Chronic respiratory failure with hypoxia: Secondary | ICD-10-CM

## 2020-08-05 DIAGNOSIS — Z96611 Presence of right artificial shoulder joint: Secondary | ICD-10-CM | POA: Diagnosis present

## 2020-08-05 DIAGNOSIS — N182 Chronic kidney disease, stage 2 (mild): Secondary | ICD-10-CM | POA: Diagnosis present

## 2020-08-05 DIAGNOSIS — Z7952 Long term (current) use of systemic steroids: Secondary | ICD-10-CM

## 2020-08-05 DIAGNOSIS — E876 Hypokalemia: Secondary | ICD-10-CM | POA: Diagnosis present

## 2020-08-05 DIAGNOSIS — R627 Adult failure to thrive: Secondary | ICD-10-CM | POA: Diagnosis present

## 2020-08-05 DIAGNOSIS — Z20822 Contact with and (suspected) exposure to covid-19: Secondary | ICD-10-CM | POA: Diagnosis present

## 2020-08-05 DIAGNOSIS — G4730 Sleep apnea, unspecified: Secondary | ICD-10-CM | POA: Diagnosis present

## 2020-08-05 DIAGNOSIS — Z886 Allergy status to analgesic agent status: Secondary | ICD-10-CM

## 2020-08-05 DIAGNOSIS — M1612 Unilateral primary osteoarthritis, left hip: Secondary | ICD-10-CM | POA: Diagnosis present

## 2020-08-05 DIAGNOSIS — Z9981 Dependence on supplemental oxygen: Secondary | ICD-10-CM

## 2020-08-05 DIAGNOSIS — F419 Anxiety disorder, unspecified: Secondary | ICD-10-CM | POA: Diagnosis present

## 2020-08-05 DIAGNOSIS — Z66 Do not resuscitate: Secondary | ICD-10-CM | POA: Diagnosis present

## 2020-08-05 DIAGNOSIS — Z888 Allergy status to other drugs, medicaments and biological substances status: Secondary | ICD-10-CM

## 2020-08-05 DIAGNOSIS — F32A Depression, unspecified: Secondary | ICD-10-CM | POA: Diagnosis present

## 2020-08-05 DIAGNOSIS — E1165 Type 2 diabetes mellitus with hyperglycemia: Secondary | ICD-10-CM | POA: Diagnosis present

## 2020-08-05 DIAGNOSIS — I252 Old myocardial infarction: Secondary | ICD-10-CM

## 2020-08-05 DIAGNOSIS — T148XXA Other injury of unspecified body region, initial encounter: Secondary | ICD-10-CM

## 2020-08-05 DIAGNOSIS — Z6839 Body mass index (BMI) 39.0-39.9, adult: Secondary | ICD-10-CM

## 2020-08-05 DIAGNOSIS — I272 Pulmonary hypertension, unspecified: Secondary | ICD-10-CM | POA: Diagnosis present

## 2020-08-05 DIAGNOSIS — Z79899 Other long term (current) drug therapy: Secondary | ICD-10-CM

## 2020-08-05 DIAGNOSIS — Z7902 Long term (current) use of antithrombotics/antiplatelets: Secondary | ICD-10-CM

## 2020-08-05 DIAGNOSIS — Z01818 Encounter for other preprocedural examination: Secondary | ICD-10-CM | POA: Diagnosis not present

## 2020-08-05 DIAGNOSIS — Z8249 Family history of ischemic heart disease and other diseases of the circulatory system: Secondary | ICD-10-CM

## 2020-08-05 DIAGNOSIS — Z7951 Long term (current) use of inhaled steroids: Secondary | ICD-10-CM

## 2020-08-05 DIAGNOSIS — E119 Type 2 diabetes mellitus without complications: Secondary | ICD-10-CM

## 2020-08-05 DIAGNOSIS — Z90722 Acquired absence of ovaries, bilateral: Secondary | ICD-10-CM

## 2020-08-05 LAB — CBC WITH DIFFERENTIAL/PLATELET
Abs Immature Granulocytes: 0.18 10*3/uL — ABNORMAL HIGH (ref 0.00–0.07)
Basophils Absolute: 0 10*3/uL (ref 0.0–0.1)
Basophils Relative: 0 %
Eosinophils Absolute: 0 10*3/uL (ref 0.0–0.5)
Eosinophils Relative: 0 %
HCT: 36.5 % (ref 36.0–46.0)
Hemoglobin: 12.2 g/dL (ref 12.0–15.0)
Immature Granulocytes: 1 %
Lymphocytes Relative: 10 %
Lymphs Abs: 1.3 10*3/uL (ref 0.7–4.0)
MCH: 31.4 pg (ref 26.0–34.0)
MCHC: 33.4 g/dL (ref 30.0–36.0)
MCV: 93.8 fL (ref 80.0–100.0)
Monocytes Absolute: 0.5 10*3/uL (ref 0.1–1.0)
Monocytes Relative: 4 %
Neutro Abs: 10.5 10*3/uL — ABNORMAL HIGH (ref 1.7–7.7)
Neutrophils Relative %: 85 %
Platelets: 241 10*3/uL (ref 150–400)
RBC: 3.89 MIL/uL (ref 3.87–5.11)
RDW: 15.8 % — ABNORMAL HIGH (ref 11.5–15.5)
WBC: 12.4 10*3/uL — ABNORMAL HIGH (ref 4.0–10.5)
nRBC: 0.2 % (ref 0.0–0.2)

## 2020-08-05 LAB — COMPREHENSIVE METABOLIC PANEL
ALT: 19 U/L (ref 0–44)
AST: 17 U/L (ref 15–41)
Albumin: 4 g/dL (ref 3.5–5.0)
Alkaline Phosphatase: 87 U/L (ref 38–126)
Anion gap: 11 (ref 5–15)
BUN: 31 mg/dL — ABNORMAL HIGH (ref 8–23)
CO2: 32 mmol/L (ref 22–32)
Calcium: 8.9 mg/dL (ref 8.9–10.3)
Chloride: 92 mmol/L — ABNORMAL LOW (ref 98–111)
Creatinine, Ser: 0.83 mg/dL (ref 0.44–1.00)
GFR calc Af Amer: >60 mL/min (ref >60)
GFR calc non Af Amer: >60 mL/min (ref >60)
Glucose, Bld: 137 mg/dL — ABNORMAL HIGH (ref 70–99)
Potassium: 4.1 mmol/L (ref 3.5–5.1)
Sodium: 135 mmol/L (ref 135–145)
Total Bilirubin: 1.1 mg/dL (ref 0.3–1.2)
Total Protein: 7 g/dL (ref 6.5–8.1)

## 2020-08-05 LAB — TROPONIN I (HIGH SENSITIVITY)
Troponin I (High Sensitivity): 13 ng/L (ref <18)
Troponin I (High Sensitivity): 12 ng/L (ref <18)

## 2020-08-05 LAB — HEMOGLOBIN A1C
Hgb A1c MFr Bld: 5.4 % (ref 4.8–5.6)
Mean Plasma Glucose: 108 mg/dL

## 2020-08-05 LAB — BRAIN NATRIURETIC PEPTIDE: B Natriuretic Peptide: 143.8 pg/mL — ABNORMAL HIGH (ref 0.0–100.0)

## 2020-08-05 LAB — GLUCOSE, CAPILLARY
Glucose-Capillary: 123 mg/dL — ABNORMAL HIGH (ref 70–99)
Glucose-Capillary: 106 mg/dL — ABNORMAL HIGH (ref 70–99)
Glucose-Capillary: 93 mg/dL (ref 70–99)
Glucose-Capillary: 165 mg/dL — ABNORMAL HIGH (ref 70–99)
Glucose-Capillary: 162 mg/dL — ABNORMAL HIGH (ref 70–99)

## 2020-08-05 LAB — SARS CORONAVIRUS 2 BY RT PCR (HOSPITAL ORDER, PERFORMED IN ~~LOC~~ HOSPITAL LAB): SARS Coronavirus 2: NEGATIVE

## 2020-08-05 SURGERY — FIXATION, FEMUR, NECK, PERCUTANEOUS, USING SCREW
Anesthesia: Choice

## 2020-08-05 MED ORDER — ALBUTEROL SULFATE (2.5 MG/3ML) 0.083% IN NEBU: 2.5000 mg | INHALATION_SOLUTION | Freq: Four times a day (QID) | RESPIRATORY_TRACT | Status: DC | PRN

## 2020-08-05 MED ORDER — METHYLPREDNISOLONE SODIUM SUCC 125 MG IJ SOLR
INTRAMUSCULAR | Status: AC
  Administered 2020-08-05: 40 mg via INTRAVENOUS
  Filled 2020-08-05: qty 2

## 2020-08-05 MED ORDER — ROCURONIUM BROMIDE 10 MG/ML (PF) SYRINGE
PREFILLED_SYRINGE | INTRAVENOUS | Status: AC
  Filled 2020-08-05: qty 10

## 2020-08-05 MED ORDER — FUROSEMIDE 10 MG/ML IJ SOLN
INTRAMUSCULAR | Status: AC
  Administered 2020-08-05: 20 mg via INTRAVENOUS
  Filled 2020-08-05: qty 2

## 2020-08-05 MED ORDER — BUDESONIDE 0.25 MG/2ML IN SUSP
0.2500 mg | Freq: Two times a day (BID) | RESPIRATORY_TRACT | Status: DC
  Administered 2020-08-05 – 2020-08-12 (×14): 0.25 mg via RESPIRATORY_TRACT
  Filled 2020-08-05 (×14): qty 2

## 2020-08-05 MED ORDER — MORPHINE SULFATE (PF) 2 MG/ML IV SOLN: 0.5000 mg | INTRAVENOUS | Status: DC | PRN

## 2020-08-05 MED ORDER — FUROSEMIDE 10 MG/ML IJ SOLN: 20.0000 mg | Freq: Once | INTRAMUSCULAR | Status: AC

## 2020-08-05 MED ORDER — UMECLIDINIUM-VILANTEROL 62.5-25 MCG/INH IN AEPB: 1.0000 | INHALATION_SPRAY | Freq: Every day | RESPIRATORY_TRACT | Status: DC

## 2020-08-05 MED ORDER — PRIMIDONE 50 MG PO TABS
50.0000 mg | ORAL_TABLET | Freq: Every day | ORAL | Status: DC
  Administered 2020-08-05 – 2020-08-12 (×7): 50 mg via ORAL
  Filled 2020-08-05 (×10): qty 1

## 2020-08-05 MED ORDER — PREDNISONE 10 MG PO TABS: 10.0000 mg | ORAL_TABLET | Freq: Every day | ORAL | Status: DC

## 2020-08-05 MED ORDER — MORPHINE SULFATE (PF) 4 MG/ML IV SOLN
4.0000 mg | Freq: Once | INTRAVENOUS | Status: AC
  Administered 2020-08-05: 4 mg via INTRAVENOUS
  Filled 2020-08-05: qty 1

## 2020-08-05 MED ORDER — METHYLPREDNISOLONE SODIUM SUCC 125 MG IJ SOLR
40.0000 mg | INTRAMUSCULAR | Status: DC
  Administered 2020-08-06: 40 mg via INTRAVENOUS
  Filled 2020-08-05: qty 2

## 2020-08-05 MED ORDER — MORPHINE SULFATE (PF) 2 MG/ML IV SOLN
0.5000 mg | INTRAVENOUS | Status: DC | PRN
  Administered 2020-08-05 (×3): 0.5 mg via INTRAVENOUS
  Filled 2020-08-05 (×3): qty 1

## 2020-08-05 MED ORDER — SODIUM CHLORIDE 0.9 % IV SOLN: INTRAVENOUS | Status: DC

## 2020-08-05 MED ORDER — IPRATROPIUM-ALBUTEROL 0.5-2.5 (3) MG/3ML IN SOLN: 3.0000 mL | RESPIRATORY_TRACT | Status: DC | PRN

## 2020-08-05 MED ORDER — CARVEDILOL 3.125 MG PO TABS
3.1250 mg | ORAL_TABLET | Freq: Every day | ORAL | Status: DC
  Administered 2020-08-05 – 2020-08-10 (×6): 3.125 mg via ORAL
  Filled 2020-08-05 (×5): qty 1
  Filled 2020-08-05 (×2): qty 0.5
  Filled 2020-08-05: qty 1
  Filled 2020-08-05: qty 0.5
  Filled 2020-08-05 (×2): qty 1
  Filled 2020-08-05: qty 0.5

## 2020-08-05 MED ORDER — ONDANSETRON HCL 4 MG/2ML IJ SOLN
INTRAMUSCULAR | Status: AC
  Filled 2020-08-05: qty 2

## 2020-08-05 MED ORDER — PREDNISONE 20 MG PO TABS: 20.0000 mg | ORAL_TABLET | Freq: Every day | ORAL | Status: DC

## 2020-08-05 MED ORDER — IPRATROPIUM-ALBUTEROL 0.5-2.5 (3) MG/3ML IN SOLN
3.0000 mL | Freq: Once | RESPIRATORY_TRACT | Status: AC
  Administered 2020-08-05: 3 mL via RESPIRATORY_TRACT
  Filled 2020-08-05: qty 3

## 2020-08-05 MED ORDER — SPIRONOLACTONE 25 MG PO TABS
25.0000 mg | ORAL_TABLET | Freq: Every day | ORAL | Status: DC
  Administered 2020-08-06 – 2020-08-12 (×6): 25 mg via ORAL
  Filled 2020-08-05 (×6): qty 1

## 2020-08-05 MED ORDER — IPRATROPIUM-ALBUTEROL 0.5-2.5 (3) MG/3ML IN SOLN
3.0000 mL | Freq: Three times a day (TID) | RESPIRATORY_TRACT | Status: DC
  Administered 2020-08-05 – 2020-08-12 (×20): 3 mL via RESPIRATORY_TRACT
  Filled 2020-08-05 (×20): qty 3

## 2020-08-05 MED ORDER — POTASSIUM CHLORIDE CRYS ER 20 MEQ PO TBCR
20.0000 meq | EXTENDED_RELEASE_TABLET | Freq: Every day | ORAL | Status: DC
  Administered 2020-08-06 – 2020-08-07 (×2): 20 meq via ORAL
  Filled 2020-08-05 (×2): qty 1

## 2020-08-05 MED ORDER — INSULIN ASPART 100 UNIT/ML ~~LOC~~ SOLN
0.0000 [IU] | SUBCUTANEOUS | Status: DC
  Administered 2020-08-05: 3 [IU] via SUBCUTANEOUS
  Administered 2020-08-05: 4 [IU] via SUBCUTANEOUS
  Administered 2020-08-05: 3 [IU] via SUBCUTANEOUS
  Administered 2020-08-06: 4 [IU] via SUBCUTANEOUS
  Administered 2020-08-06: 11 [IU] via SUBCUTANEOUS
  Administered 2020-08-06 – 2020-08-07 (×2): 3 [IU] via SUBCUTANEOUS
  Administered 2020-08-07 (×4): 4 [IU] via SUBCUTANEOUS
  Administered 2020-08-08: 7 [IU] via SUBCUTANEOUS
  Administered 2020-08-08: 4 [IU] via SUBCUTANEOUS
  Administered 2020-08-08: 3 [IU] via SUBCUTANEOUS
  Administered 2020-08-08: 4 [IU] via SUBCUTANEOUS
  Administered 2020-08-08 – 2020-08-09 (×3): 3 [IU] via SUBCUTANEOUS
  Administered 2020-08-09: 4 [IU] via SUBCUTANEOUS
  Administered 2020-08-10 – 2020-08-11 (×4): 3 [IU] via SUBCUTANEOUS
  Administered 2020-08-12: 4 [IU] via SUBCUTANEOUS
  Administered 2020-08-12: 3 [IU] via SUBCUTANEOUS
  Filled 2020-08-05 (×25): qty 1

## 2020-08-05 MED ORDER — LIDOCAINE HCL (PF) 2 % IJ SOLN
INTRAMUSCULAR | Status: AC
  Filled 2020-08-05: qty 5

## 2020-08-05 MED ORDER — ACETAMINOPHEN 500 MG PO TABS
1000.0000 mg | ORAL_TABLET | Freq: Three times a day (TID) | ORAL | Status: DC
  Administered 2020-08-05 – 2020-08-09 (×12): 1000 mg via ORAL
  Filled 2020-08-05 (×11): qty 2

## 2020-08-05 MED ORDER — OXYCODONE HCL 5 MG PO TABS
5.0000 mg | ORAL_TABLET | ORAL | Status: DC | PRN
  Administered 2020-08-05 – 2020-08-08 (×9): 5 mg via ORAL
  Filled 2020-08-05 (×9): qty 1

## 2020-08-05 MED ORDER — MIRTAZAPINE 15 MG PO TABS
7.5000 mg | ORAL_TABLET | Freq: Every day | ORAL | Status: DC
  Administered 2020-08-05 – 2020-08-11 (×7): 7.5 mg via ORAL
  Filled 2020-08-05 (×7): qty 1

## 2020-08-05 MED ORDER — BUPROPION HCL ER (SR) 100 MG PO TB12
100.0000 mg | ORAL_TABLET | Freq: Every day | ORAL | Status: DC
  Administered 2020-08-05 – 2020-08-12 (×7): 100 mg via ORAL
  Filled 2020-08-05 (×8): qty 1

## 2020-08-05 MED ORDER — MONTELUKAST SODIUM 10 MG PO TABS
10.0000 mg | ORAL_TABLET | Freq: Every day | ORAL | Status: DC
  Administered 2020-08-05 – 2020-08-11 (×7): 10 mg via ORAL
  Filled 2020-08-05 (×7): qty 1

## 2020-08-05 MED ORDER — DEXAMETHASONE SODIUM PHOSPHATE 10 MG/ML IJ SOLN
INTRAMUSCULAR | Status: AC
  Filled 2020-08-05: qty 1

## 2020-08-05 MED ORDER — IPRATROPIUM-ALBUTEROL 0.5-2.5 (3) MG/3ML IN SOLN
RESPIRATORY_TRACT | Status: AC
  Administered 2020-08-05: 3 mL via RESPIRATORY_TRACT
  Filled 2020-08-05: qty 3

## 2020-08-05 MED ORDER — SODIUM CHLORIDE (PF) 0.9 % IJ SOLN
INTRAMUSCULAR | Status: AC
  Filled 2020-08-05: qty 10

## 2020-08-05 MED ORDER — HYDROCODONE-ACETAMINOPHEN 5-325 MG PO TABS: 1.0000 | ORAL_TABLET | Freq: Four times a day (QID) | ORAL | Status: DC | PRN

## 2020-08-05 MED ORDER — GABAPENTIN 100 MG PO CAPS
200.0000 mg | ORAL_CAPSULE | Freq: Three times a day (TID) | ORAL | Status: DC
  Administered 2020-08-05 – 2020-08-12 (×20): 200 mg via ORAL
  Filled 2020-08-05 (×20): qty 2

## 2020-08-05 MED ORDER — FLUOXETINE HCL 20 MG PO CAPS
60.0000 mg | ORAL_CAPSULE | Freq: Every day | ORAL | Status: DC
  Administered 2020-08-05 – 2020-08-12 (×6): 60 mg via ORAL
  Filled 2020-08-05 (×8): qty 3

## 2020-08-05 MED ORDER — DM-GUAIFENESIN ER 30-600 MG PO TB12
1.0000 | ORAL_TABLET | Freq: Two times a day (BID) | ORAL | Status: DC
  Administered 2020-08-05 – 2020-08-06 (×3): 1 via ORAL
  Filled 2020-08-05 (×3): qty 1

## 2020-08-05 MED ORDER — MELATONIN 5 MG PO TABS
5.0000 mg | ORAL_TABLET | Freq: Every day | ORAL | Status: DC
  Administered 2020-08-05 – 2020-08-11 (×7): 5 mg via ORAL
  Filled 2020-08-05 (×7): qty 1

## 2020-08-05 MED ORDER — SIMVASTATIN 20 MG PO TABS
20.0000 mg | ORAL_TABLET | Freq: Every day | ORAL | Status: DC
  Administered 2020-08-05 – 2020-08-12 (×7): 20 mg via ORAL
  Filled 2020-08-05: qty 2
  Filled 2020-08-05 (×3): qty 1
  Filled 2020-08-05 (×4): qty 2

## 2020-08-05 MED ORDER — PROPOFOL 10 MG/ML IV BOLUS
INTRAVENOUS | Status: AC
  Filled 2020-08-05: qty 20

## 2020-08-05 MED ORDER — TORSEMIDE 20 MG PO TABS
40.0000 mg | ORAL_TABLET | Freq: Two times a day (BID) | ORAL | Status: DC
  Administered 2020-08-05 – 2020-08-12 (×13): 40 mg via ORAL
  Filled 2020-08-05 (×13): qty 2

## 2020-08-05 SURGICAL SUPPLY — 29 items
BLADE SURG SZ10 CARB STEEL (BLADE) ×4 IMPLANT
BNDG COHESIVE 6X5 TAN STRL LF (GAUZE/BANDAGES/DRESSINGS) ×8 IMPLANT
CANISTER SUCT 1200ML W/VALVE (MISCELLANEOUS) ×4 IMPLANT
COVER WAND RF STERILE (DRAPES) ×4 IMPLANT
DRAPE SURG 17X11 SM STRL (DRAPES) ×8 IMPLANT
DRAPE U-SHAPE 47X51 STRL (DRAPES) ×4 IMPLANT
DRSG OPSITE POSTOP 3X4 (GAUZE/BANDAGES/DRESSINGS) IMPLANT
DRSG OPSITE POSTOP 4X6 (GAUZE/BANDAGES/DRESSINGS) IMPLANT
DURAPREP 26ML APPLICATOR (WOUND CARE) ×8 IMPLANT
ELECT REM PT RETURN 9FT ADLT (ELECTROSURGICAL) ×3
ELECTRODE REM PT RTRN 9FT ADLT (ELECTROSURGICAL) ×2 IMPLANT
GAUZE SPONGE 4X4 12PLY STRL (GAUZE/BANDAGES/DRESSINGS) ×4 IMPLANT
GLOVE BIOGEL PI IND STRL 9 (GLOVE) ×2 IMPLANT
GLOVE BIOGEL PI INDICATOR 9 (GLOVE) ×2
GLOVE SURG 9.0 ORTHO LTXF (GLOVE) ×8 IMPLANT
GOWN STRL REUS TWL 2XL XL LVL4 (GOWN DISPOSABLE) ×4 IMPLANT
GOWN STRL REUS W/ TWL LRG LVL3 (GOWN DISPOSABLE) ×2 IMPLANT
GOWN STRL REUS W/TWL LRG LVL3 (GOWN DISPOSABLE) ×3
HOLDER FOLEY CATH W/STRAP (MISCELLANEOUS) IMPLANT
MAT ABSORB  FLUID 56X50 GRAY (MISCELLANEOUS) ×2
MAT ABSORB FLUID 56X50 GRAY (MISCELLANEOUS) ×2 IMPLANT
NS IRRIG 1000ML POUR BTL (IV SOLUTION) ×4 IMPLANT
PACK HIP COMPR (MISCELLANEOUS) ×4 IMPLANT
STAPLER SKIN PROX 35W (STAPLE) ×4 IMPLANT
STRAP SAFETY 5IN WIDE (MISCELLANEOUS) ×4 IMPLANT
SUT VIC AB 0 CT1 36 (SUTURE) ×4 IMPLANT
SUT VIC AB 2-0 CT2 27 (SUTURE) ×4 IMPLANT
SUT VICRYL 0 AB UR-6 (SUTURE) ×8 IMPLANT
TRAY FOLEY MTR SLVR 16FR STAT (SET/KITS/TRAYS/PACK) IMPLANT

## 2020-08-05 NOTE — Consult Note (Signed)
ORTHOPAEDIC CONSULTATION  REQUESTING PHYSICIAN: Edwin Dada, *  Chief Complaint: Left hip pain  HPI: Sara Pierce is a 79 y.o. female who complains of left hip pain after a fall at [facility] facility while she was being transferred with a physical therapist.  Patient felt pain in her left hip and was unable to stand after this injury.  Patient is nonambulatory at baseline.  Patient is O2 dependent at baseline.  Her husband is at the bedside.  Past Medical History:  Diagnosis Date  . Arthritis   . Cardiomyopathy ([place])   . Cataract   . Cerebral hemorrhage ([name])   . CHF (congestive heart failure) ([place])   . Chronic kidney disease   . Clotting disorder ([place])   . COPD (chronic obstructive pulmonary disease) ([place])   . Depression   . Diabetes mellitus without complication ([place])   . Hypertension   . Mixed incontinence   . Obesity   . Sleep apnea   . Thyroid disease   . Urinary frequency   . Vaginal atrophy   . Varicose veins    Past Surgical History:  Procedure Laterality Date  . APPENDECTOMY    . Cardiac Catherization    . COLONOSCOPY WITH PROPOFOL N/A 04/28/2016   Procedure: COLONOSCOPY WITH PROPOFOL;  Surgeon: Manya Silvas, MD;  Location: [facility] ENDOSCOPY;  Service: Endoscopy;  Laterality: N/A;  . COLONOSCOPY WITH PROPOFOL N/A 04/29/2016   Procedure: COLONOSCOPY WITH PROPOFOL;  Surgeon: Manya Silvas, MD;  Location: [facility] ENDOSCOPY;  Service: Endoscopy;  Laterality: N/A;  . JOINT REPLACEMENT    . STRABISMUS SURGERY    . TONSILLECTOMY    . TUBAL LIGATION     Social History   Socioeconomic History  . Marital status: Married    Spouse name: Not on file  . Number of children: Not on file  . Years of education: Not on file  . Highest education level: Not on file  Occupational History  . Occupation: retired  Tobacco Use  . Smoking status: Former Smoker    Packs/day: 0.50    Years: 44.00    Pack years: 22.00    Types: Cigarettes  .  Smokeless tobacco: Never Used  Vaping Use  . Vaping Use: Never used  Substance and Sexual Activity  . Alcohol use: No  . Drug use: No  . Sexual activity: Never  Other Topics Concern  . Not on file  Social History Narrative  . Not on file   Social Determinants of Health   Financial Resource Strain:   . Difficulty of Paying Living Expenses: Not on file  Food Insecurity:   . Worried About Charity fundraiser in the Last Year: Not on file  . Ran Out of Food in the Last Year: Not on file  Transportation Needs:   . Lack of Transportation (Medical): Not on file  . Lack of Transportation (Non-Medical): Not on file  Physical Activity:   . Days of Exercise per Week: Not on file  . Minutes of Exercise per Session: Not on file  Stress:   . Feeling of Stress : Not on file  Social Connections:   . Frequency of Communication with Friends and Family: Not on file  . Frequency of Social Gatherings with Friends and Family: Not on file  . Attends Religious Services: Not on file  . Active Member of Clubs or Organizations: Not on file  . Attends Archivist Meetings: Not on file  . Marital  Status: Not on file   Family History  Problem Relation Age of Onset  . Heart attack Father   . Coronary artery disease Father   . Hypertension Father   . Hyperlipidemia Father   . Heart attack Mother   . Hypertension Mother   . Hyperlipidemia Mother   . Breast cancer Neg Hx    Allergies  Allergen Reactions  . Aspirin Swelling  . Codeine Itching  . Naprosyn [Naproxen] Other (See Comments)    Per MAR pt allergic  . Other Other (See Comments)    Elastic Bandages/ Supports Per MAR  . Tape Hives  . Valacyclovir Other (See Comments)    Reaction: unknown  . Latex Rash   Prior to Admission medications   Medication Sig Start Date End Date Taking? Authorizing Provider  acetaminophen (TYLENOL) 500 MG tablet Take 500 mg by mouth every 6 (six) hours as needed for mild pain.    Yes [provider]  albuterol (VENTOLIN HFA) 108 (90 Base) MCG/ACT inhaler Inhale 2 puffs into the lungs every 6 (six) hours as needed for wheezing or shortness of breath.   Yes [provider]  ALPRAZolam (XANAX) 0.25 MG tablet Take 0.25 mg by mouth 2 (two) times daily.   Yes [provider]  Amino Acids-Protein Hydrolys (FEEDING SUPPLEMENT, PRO-STAT SUGAR FREE 64,) LIQD Take 30 mLs by mouth daily.   Yes [provider]  ascorbic acid (VITAMIN C) 500 MG tablet Take 500 mg by mouth daily.   Yes [provider]  budesonide (PULMICORT) 180 MCG/ACT inhaler Inhale 1 puff into the lungs 2 (two) times daily.    Yes [provider]  buPROPion (WELLBUTRIN SR) 100 MG 12 hr tablet Take 100 mg by mouth daily.   Yes [provider]  carvedilol (COREG) 3.125 MG tablet Take 3.125 mg by mouth at bedtime.   Yes [provider]  clopidogrel (PLAVIX) 75 MG tablet Take 75 mg by mouth daily.   Yes [provider]  dextromethorphan-guaiFENesin (MUCINEX DM) 30-600 MG 12hr tablet Take 1 tablet by mouth 2 (two) times daily.   Yes [provider]  ferrous sulfate 325 (65 FE) MG EC tablet Take 325 mg by mouth every other day.   Yes [provider]  FLUoxetine (PROZAC) 20 MG capsule Take 60 mg by mouth daily.    Yes [provider]  fluticasone (VERAMYST) 27.5 MCG/SPRAY nasal spray Place 2 sprays into the nose daily.   Yes [provider]  gabapentin (NEURONTIN) 100 MG capsule Take 2 capsules (200 mg total) by mouth 3 (three) times daily. 04/22/18  Yes Gouru, Illene Silver, MD  hydrOXYzine (ATARAX/VISTARIL) 25 MG tablet Take 25 mg by mouth 2 (two) times daily.    Yes [provider]  hydrOXYzine (ATARAX/VISTARIL) 25 MG tablet Take 25 mg by mouth 4 (four) times daily as needed for anxiety.   Yes [provider]  Ipratropium-Albuterol (COMBIVENT RESPIMAT) 20-100 MCG/ACT AERS respimat Inhale 1 puff into the lungs 3  (three) times daily.   Yes [provider]  magnesium oxide (MAG-OX) 400 MG tablet Take 400 mg by mouth daily.   Yes [provider]  melatonin 3 MG TABS tablet Take 3 mg by mouth at bedtime.   Yes [provider]  mirtazapine (REMERON) 7.5 MG tablet Take 7.5 mg by mouth at bedtime.    Yes [provider]  montelukast (SINGULAIR) 10 MG tablet Take 10 mg by mouth at bedtime.    Yes  [provider]  Multiple Vitamin (MULTIVITAMIN WITH MINERALS) TABS tablet Take 1 tablet by mouth daily.   Yes [provider]  ondansetron (ZOFRAN) 4 MG tablet Take 1 tablet (4 mg total) by mouth every 6 (six) hours as needed for nausea. 01/22/20  Yes Hoyt Koch, MD  polyethylene glycol (MIRALAX / GLYCOLAX) 17 g packet Take 17 g by mouth daily as needed for mild constipation.   Yes [provider]  potassium chloride SA (KLOR-CON) 20 MEQ tablet Take 20 mEq by mouth daily.   Yes [provider]  predniSONE (DELTASONE) 10 MG tablet Take 10 mg by mouth every evening.   Yes [provider]  primidone (MYSOLINE) 50 MG tablet Take 50 mg by mouth at bedtime.    Yes [provider]  saccharomyces boulardii (FLORASTOR) 250 MG capsule Take 250 mg by mouth daily.   Yes [provider]  simvastatin (ZOCOR) 20 MG tablet Take 20 mg by mouth daily.   Yes [provider]  spironolactone (ALDACTONE) 25 MG tablet Take 25 mg by mouth daily.   Yes [provider]  torsemide (DEMADEX) 20 MG tablet Take 40 mg by mouth 2 (two) times daily.   Yes [provider]  umeclidinium-vilanterol (ANORO ELLIPTA) 62.5-25 MCG/INH AEPB Inhale 1 puff into the lungs daily.   Yes [provider]  vitamin B-12 (CYANOCOBALAMIN) 1000 MCG tablet Take 1,000 mcg by mouth daily.   Yes [provider]  B Complex-C (B-COMPLEX WITH VITAMIN C) tablet Take 1 tablet by mouth daily.    [provider]   DG Chest 1  View  Result Date: 08/05/2020 CLINICAL DATA:  Cough EXAM: CHEST  1 VIEW COMPARISON:  05/20/2020 FINDINGS: Lung volumes are small, however, pulmonary insufflation remain stable since prior examination. Left basilar scarring is unchanged. No superimposed confluent pulmonary infiltrate. No pneumothorax or pleural effusion. Superior mediastinal widening is noted, likely accentuated by supine positioning and poor pulmonary insufflation. Mild cardiomegaly appears stable. Pulmonary vascularity is normal. No acute bone abnormality. 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 IMPRESSION: 1. No acute cardiopulmonary disease. 2. Superior mediastinal widening, likely accentuated by supine positioning and poor pulmonary insufflation. This may be better assessed with a standard two view chest radiograph or CT imaging. Electronically Signed   By: Fidela Salisbury MD   On: 08/05/2020 01:36   DG Hip Unilat W or Wo Pelvis 2-3 Views Left  Result Date: 08/05/2020 CLINICAL DATA:  Left hip pain EXAM: DG HIP (WITH OR WITHOUT PELVIS) 2-3V LEFT COMPARISON:  None. FINDINGS: Two view radiograph of the left hip demonstrates an impacted subcapital left femoral neck fracture. The femoral head is still seated within the acetabulum. There is mild superimposed left hip degenerative arthritis. The osseous structures are diffusely osteopenic, however, the pelvis and sacrum appear intact. Limited evaluation of the right hip demonstrates mild-to-moderate degenerative arthritis. IMPRESSION: Impacted subcapital left femoral neck fracture. Electronically Signed   By: Fidela Salisbury MD   On: 08/05/2020 01:34    Positive ROS: All other systems have been reviewed and were otherwise negative with the exception of those mentioned in the HPI and as above.  Physical Exam: General: Alert, no acute distress Cardiovascular: No pedal edema Respiratory: No cyanosis, no use of accessory musculature GI: No organomegaly, abdomen is soft and  non-tender Skin: No lesions in the area of chief complaint Neurologic: Sensation intact distally Psychiatric: Patient is competent for consent with normal mood and affect Lymphatic: No axillary or cervical  lymphadenopathy  MUSCULOSKELETAL: Left hip: Patient skin is intact.  There is no significant shortening or external rotation.  Patient has palpable pedal pulses, intact sensation light touch and intact motor function distally.  Assessment: Impacted left femoral neck hip fracture  Plan: I have reviewed the x-rays and have recommended percutaneous fixation for the patient's impacted left femoral hip fracture.  Despite being nonambulatory fixation of the fracture would provide stability to allow for the hip to heal, provide pain relief and allow for nursing care without severe pain.  I reviewed the details of the operation as well as the postoperative course.  I discussed the risks and benefits of surgery. The risks include but are not limited to infection, bleeding, nerve or blood vessel injury, joint stiffness or loss of motion, persistent pain, weakness or instability, malunion, nonunion and hardware failure and the need for further surgery including conversion to hemiarthroplasty versus total hip arthroplasty. Medical risks include but are not limited to DVT and pulmonary embolism, myocardial infarction, stroke, pneumonia, respiratory failure and death. Patient understood these risks and wished to proceed.   Patient was having shortness of breath at the bedside in the preoperative area.  Anesthesiologist requested pulmonary consultation.  The patient is on Plavix at baseline.  Her last dose was yesterday.  Anesthesia has recommended waiting several days until a spinal can be placed given the patient's poor respiratory status.  Pulmonary consultation is pending.  We will hold on surgery until spinal fluid be given.   Thornton Park, MD    08/05/2020 11:39 AM

## 2020-08-05 NOTE — ED Triage Notes (Signed)
PT to ED via EMS from [facility]. PT stated she felt a "pop" yesterday during PT accompanied by pain in left hip. Facility did xray and pt has femoral head fracture. PT also has PNA, wears 3L [place] at baseline.

## 2020-08-05 NOTE — Consult Note (Signed)
Pulmonary Medicine          Date: 08/05/2020,   MRN# [id number] Sara Pierce [birth year]     AdmissionWeight: 108.9 kg                 CurrentWeight: 108.9 kg  Dr Justice Rocher    CHIEF COMPLAINT:   Acute on chronic hypoxemic respiratory failure   HISTORY OF PRESENT ILLNESS   This is a 79yo F from [facility] facility, she has hx of advanced COPD on chronic steroids and chronic hypoxemia with 3L/min [place] supplemental O2.  She has several more comorbid conditions as below with significant hx including CHF and pulmonary hypertension.  She was brought to hospital after sustaining left hip fracture. She is accompanied by husband at bedside during my evaluation.  Anesthesiologist called for consultation due to complicated respiratory status with acute on chronic respiratory failure with hypoxemia. Patient is scheduled to [facility] hip repair surgery, I have discussed her care plan with surgery Dr Mack Guise.  CXR was done showing bilateral interstitial inflitlrates with cephalization and kerley b lines suggestive of pulmonary edema of mild severity.  PAST MEDICAL HISTORY   Past Medical History:  Diagnosis Date  . Arthritis   . Cardiomyopathy ([place])   . Cataract   . Cerebral hemorrhage ([place])   . CHF (congestive heart failure) ([place])   . Chronic kidney disease   . Clotting disorder ([place])   . COPD (chronic obstructive pulmonary disease) ([place])   . Depression   . Diabetes mellitus without complication ([place])   . Hypertension   . Mixed incontinence   . Obesity   . Sleep apnea   . Thyroid disease   . Urinary frequency   . Vaginal atrophy   . Varicose veins      SURGICAL HISTORY   Past Surgical History:  Procedure Laterality Date  . APPENDECTOMY    . Cardiac Catherization    . COLONOSCOPY WITH PROPOFOL N/A 04/28/2016   Procedure: COLONOSCOPY WITH PROPOFOL;  Surgeon: Manya Silvas, MD;  Location: [facility] ENDOSCOPY;  Service: Endoscopy;  Laterality: N/A;  . COLONOSCOPY  WITH PROPOFOL N/A 04/29/2016   Procedure: COLONOSCOPY WITH PROPOFOL;  Surgeon: Manya Silvas, MD;  Location: [facility] ENDOSCOPY;  Service: Endoscopy;  Laterality: N/A;  . JOINT REPLACEMENT    . STRABISMUS SURGERY    . TONSILLECTOMY    . TUBAL LIGATION       FAMILY HISTORY   Family History  Problem Relation Age of Onset  . Heart attack Father   . Coronary artery disease Father   . Hypertension Father   . Hyperlipidemia Father   . Heart attack Mother   . Hypertension Mother   . Hyperlipidemia Mother   . Breast cancer Neg Hx      SOCIAL HISTORY   Social History   Tobacco Use  . Smoking status: Former Smoker    Packs/day: 0.50    Years: 44.00    Pack years: 22.00    Types: Cigarettes  . Smokeless tobacco: Never Used  Vaping Use  . Vaping Use: Never used  Substance Use Topics  . Alcohol use: No  . Drug use: No     MEDICATIONS    Home Medication:    Current Medication:  Current Facility-Administered Medications:  .  0.9 %  sodium chloride infusion, , Intravenous, Continuous, Athena Masse, MD, Last Rate: 75 mL/hr at 08/05/20 0700, New Bag at 08/05/20 0700 .  [MAR Hold] albuterol (VENTOLIN HFA)  108 (90 Base) MCG/ACT inhaler 2 puff, 2 puff, Inhalation, Q6H PRN, Danford, Suann Larry, MD .  Doug Sou Hold] budesonide (PULMICORT) 180 MCG/ACT inhaler 1 puff, 1 puff, Inhalation, BID, Danford, Suann Larry, MD .  Doug Sou Hold] buPROPion (WELLBUTRIN SR) 12 hr tablet 100 mg, 100 mg, Oral, Daily, Danford, Suann Larry, MD .  Doug Sou Hold] carvedilol (COREG) tablet 3.125 mg, 3.125 mg, Oral, QHS, Danford, Suann Larry, MD .  Doug Sou Hold] dextromethorphan-guaiFENesin (MUCINEX DM) 30-600 MG per 12 hr tablet 1 tablet, 1 tablet, Oral, BID, Danford, Suann Larry, MD .  Doug Sou Hold] FLUoxetine (PROZAC) capsule 60 mg, 60 mg, Oral, Daily, Danford, Suann Larry, MD .  Doug Sou Hold] gabapentin (NEURONTIN) capsule 200 mg, 200 mg, Oral, TID, Danford, Suann Larry, MD .  Doug Sou Hold]  HYDROcodone-acetaminophen (NORCO/VICODIN) 5-325 MG per tablet 1-2 tablet, 1-2 tablet, Oral, Q6H PRN, Athena Masse, MD .  Doug Sou Hold] insulin aspart (novoLOG) injection 0-20 Units, 0-20 Units, Subcutaneous, Q4H, Athena Masse, MD, 3 Units at 08/05/20 0418 .  [MAR Hold] Ipratropium-Albuterol (COMBIVENT) respimat 1 puff, 1 puff, Inhalation, TID, Danford, Suann Larry, MD .  Doug Sou Hold] ipratropium-albuterol (DUONEB) 0.5-2.5 (3) MG/3ML nebulizer solution 3 mL, 3 mL, Nebulization, Q4H PRN, Athena Masse, MD, 3 mL at 08/05/20 1202 .  [MAR Hold] melatonin tablet 3 mg, 3 mg, Oral, QHS, Danford, Christopher P, MD .  methylPREDNISolone sodium succinate (SOLU-MEDROL) 125 mg/2 mL injection 40 mg, 40 mg, Intravenous, Q24H, Lanney Gins, Karl Knarr, MD, 40 mg at 08/05/20 1202 .  [MAR Hold] mirtazapine (REMERON) tablet 7.5 mg, 7.5 mg, Oral, QHS, Danford, Suann Larry, MD .  Doug Sou Hold] montelukast (SINGULAIR) tablet 10 mg, 10 mg, Oral, QHS, Danford, Suann Larry, MD .  Doug Sou Hold] morphine 2 MG/ML injection 0.5 mg, 0.5 mg, Intravenous, Q2H PRN, Athena Masse, MD, 0.5 mg at 08/05/20 0941 .  [MAR Hold] potassium chloride SA (KLOR-CON) CR tablet 20 mEq, 20 mEq, Oral, Daily, Danford, Suann Larry, MD .  Doug Sou Hold] primidone (MYSOLINE) tablet 50 mg, 50 mg, Oral, QHS, Danford, Suann Larry, MD .  Doug Sou Hold] simvastatin (ZOCOR) tablet 20 mg, 20 mg, Oral, Daily, Danford, Suann Larry, MD .  Doug Sou Hold] spironolactone (ALDACTONE) tablet 25 mg, 25 mg, Oral, Daily, Danford, Suann Larry, MD .  Doug Sou Hold] torsemide (DEMADEX) tablet 40 mg, 40 mg, Oral, BID, Danford, Suann Larry, MD .  Doug Sou Hold] umeclidinium-vilanterol (ANORO ELLIPTA) 62.5-25 MCG/INH 1 puff, 1 puff, Inhalation, Daily, Danford, Suann Larry, MD    ALLERGIES   Aspirin, Codeine, Naprosyn [naproxen], Other, Tape, Valacyclovir, and Latex     REVIEW OF SYSTEMS    Review of Systems:  Gen:  Denies  fever, sweats, chills weigh loss  HEENT: Denies  blurred vision, double vision, ear pain, eye pain, hearing loss, nose bleeds, sore throat Cardiac:  No dizziness, chest pain or heaviness, chest tightness,edema Resp:   Denies cough or sputum porduction, shortness of breath,wheezing, hemoptysis,  Gi: Denies swallowing difficulty, stomach pain, nausea or vomiting, diarrhea, constipation, bowel incontinence Gu:  Denies bladder incontinence, burning urine Ext:   Denies Joint pain, stiffness or swelling Skin: Denies  skin rash, easy bruising or bleeding or hives Endoc:  Denies polyuria, polydipsia , polyphagia or weight change Psych:   Denies depression, insomnia or hallucinations   Other:  All other systems negative   VS: BP (!) 111/95   Pulse 71   Temp (!) 97.1 F (36.2 C) (Tympanic)   Resp 18   Ht 5\' 5"  (1.651 m)  Wt 108.9 kg   SpO2 98%   BMI 39.94 kg/m      PHYSICAL EXAM    GENERAL:NAD, no fevers, chills, no weakness no fatigue HEAD: Normocephalic, atraumatic.  EYES: Pupils equal, round, reactive to light. Extraocular muscles intact. No scleral icterus.  MOUTH: Moist mucosal membrane. Dentition intact. No abscess noted.  EAR, NOSE, THROAT: Clear without exudates. No external lesions.  NECK: Supple. No thyromegaly. No nodules. No JVD.  PULMONARY: Decreased breath sounds bilaterally with posterior faint crackles CARDIOVASCULAR: S1 and S2. Regular rate and rhythm. No murmurs, rubs, or gallops. No edema. Pedal pulses 2+ bilaterally.  GASTROINTESTINAL: Soft, nontender, nondistended. No masses. Positive bowel sounds. No hepatosplenomegaly.  MUSCULOSKELETAL: No swelling, clubbing, or edema. Range of motion full in all extremities.  NEUROLOGIC: Cranial nerves II through XII are intact. No gross focal neurological deficits. Sensation intact. Reflexes intact.  SKIN: No ulceration, lesions, rashes, or cyanosis. Skin warm and dry. Turgor intact.  PSYCHIATRIC: Mood, affect within normal limits. The patient is awake, alert and oriented x  3. Insight, judgment intact.       IMAGING    DG Chest 1 View  Result Date: 08/05/2020 CLINICAL DATA:  Cough EXAM: CHEST  1 VIEW COMPARISON:  05/20/2020 FINDINGS: Lung volumes are small, however, pulmonary insufflation remain stable since prior examination. Left basilar scarring is unchanged. No superimposed confluent pulmonary infiltrate. No pneumothorax or pleural effusion. Superior mediastinal widening is noted, likely accentuated by supine positioning and poor pulmonary insufflation. Mild cardiomegaly appears stable. Pulmonary vascularity is normal. No acute bone abnormality. 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 IMPRESSION: 1. No acute cardiopulmonary disease. 2. Superior mediastinal widening, likely accentuated by supine positioning and poor pulmonary insufflation. This may be better assessed with a standard two view chest radiograph or CT imaging. Electronically Signed   By: Fidela Salisbury MD   On: 08/05/2020 01:36   DG Hip Unilat W or Wo Pelvis 2-3 Views Left  Result Date: 08/05/2020 CLINICAL DATA:  Left hip pain EXAM: DG HIP (WITH OR WITHOUT PELVIS) 2-3V LEFT COMPARISON:  None. FINDINGS: Two view radiograph of the left hip demonstrates an impacted subcapital left femoral neck fracture. The femoral head is still seated within the acetabulum. There is mild superimposed left hip degenerative arthritis. The osseous structures are diffusely osteopenic, however, the pelvis and sacrum appear intact. Limited evaluation of the right hip demonstrates mild-to-moderate degenerative arthritis. IMPRESSION: Impacted subcapital left femoral neck fracture. 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, will order  solumedrol 40 daily once now   - DuoNEB per typical COPD carepath   - lasix 20x1 due to mild pulm edema in context of borderline hypotension   - consider zithromax 500 po daily x5   Chronic HFpEF   cardiology on case - appreciate input - continue with diuretic, coreg, hold plavix   Pulmonary Hypertension    - additional comorbidity complicating respiratory status   - likely due to CHF/COPD   -continue with supplemental O2, diuresis   - will consider digoxin or revatio on outpatient    OSA     - continue CPAP per home settings   Left Hip fracture   - discussed  care plan with Anesthesia and Orthopedics team  -patient with moderate risk for post operative respiratory failure- recommend extubation to CPAP/BIPAP and IS x10/h when back on nasal canula    Thank you for allowing me to participate in the care of this patient.   Patient/Family are satisfied with care plan and all questions have been answered.   This document was prepared using Dragon voice recognition software and may include unintentional dictation errors.     Ottie Glazier, M.D.  Division of [place]

## 2020-08-05 NOTE — H&P (Signed)
History and Physical    Sara Pierce XNT:[id number] DOB: August 12, 1941 DOA: 08/05/2020  PCP: Marygrace Drought, MD   Patient coming from: SNF  I have personally briefly reviewed patient's old medical records in [place]  Chief Complaint: Left hip fracture  HPI: Sara Pierce is a 79 y.o. female with medical history significant for diastolic heart failure, sleep apnea, DM, HTN, hypothyroidism, COPD with chronic respiratory failure on home O2 at 3 L, depression, osteoporosis, obesity and nonambulant at baseline who was sent from nursing home to the hospital for evaluation of left hip fracture.  Patient was transferring with PT when she felt a pop in her left hip and x-rays at the nursing home revealed a left hip fracture.  Patient was previously in her usual state of health though home reports that she was on antibiotics due to a concern for pneumonia based on a nonproductive cough she had been having.  Patient denies chest pain or shortness of breath or fever or chills ED Course: On arrival vitals were within normal limits.  Blood work for the most part unremarkable except for slightly elevated BNP of 143 and mild leukocytosis of 12,000.  Chest x-ray showed no acute disease.  Hip x-ray showed impacted subcapital left femoral neck fracture.  The emergency room provider spoke with orthopedist, Dr. Mack Guise who will try to take patient to the OR in the a.m.  Hospitalist consulted for admission.  Review of Systems: As per HPI otherwise all other systems on review of systems negative.    Past Medical History:  Diagnosis Date  . Arthritis   . Cardiomyopathy ([place])   . Cataract   . Cerebral hemorrhage ([place])   . CHF (congestive heart failure) ([place])   . Chronic kidney disease   . Clotting disorder ([place])   . COPD (chronic obstructive pulmonary disease) ([place])   . Depression   . Diabetes mellitus without complication ([place])   . Hypertension   . Mixed incontinence   . Obesity   . Sleep apnea    . Thyroid disease   . Urinary frequency   . Vaginal atrophy   . Varicose veins     Past Surgical History:  Procedure Laterality Date  . APPENDECTOMY    . Cardiac Catherization    . COLONOSCOPY WITH PROPOFOL N/A 04/28/2016   Procedure: COLONOSCOPY WITH PROPOFOL;  Surgeon: Manya Silvas, MD;  Location: [facility] ENDOSCOPY;  Service: Endoscopy;  Laterality: N/A;  . COLONOSCOPY WITH PROPOFOL N/A 04/29/2016   Procedure: COLONOSCOPY WITH PROPOFOL;  Surgeon: Manya Silvas, MD;  Location: [facility] ENDOSCOPY;  Service: Endoscopy;  Laterality: N/A;  . JOINT REPLACEMENT    . STRABISMUS SURGERY    . TONSILLECTOMY    . TUBAL LIGATION       reports that she has quit smoking. Her smoking use included cigarettes. She has a 22.00 pack-year smoking history. She has never used smokeless tobacco. She reports that she does not drink alcohol and does not use drugs.  Allergies  Allergen Reactions  . Aspirin Swelling  . Codeine Itching  . Naprosyn [Naproxen] Other (See Comments)    Per MAR pt allergic  . Other     Elastic Bandages/ Supports Per MAR  . Tape Hives  . Valacyclovir   . Latex Rash    Family History  Problem Relation Age of Onset  . Heart attack Father   . Coronary artery disease Father   . Hypertension Father   . Hyperlipidemia Father   .  Heart attack Mother   . Hypertension Mother   . Hyperlipidemia Mother   . Breast cancer Neg Hx       Prior to Admission medications   Medication Sig Start Date End Date Taking? Authorizing Provider  acetaminophen (TYLENOL) 500 MG tablet Take 1,000 mg by mouth every 6 (six) hours as needed for mild pain.    [provider]  albuterol (VENTOLIN HFA) 108 (90 Base) MCG/ACT inhaler Inhale 2 puffs into the lungs every 6 (six) hours as needed for wheezing or shortness of breath.    [provider]  ALPRAZolam Duanne Moron) 0.25 MG tablet Take 0.25 mg by mouth 2 (two) times daily.    [provider]  B Complex-C (B-COMPLEX WITH  VITAMIN C) tablet Take 1 tablet by mouth daily.    [provider]  budesonide (PULMICORT) 180 MCG/ACT inhaler Inhale 2 puffs into the lungs 2 (two) times daily.    [provider]  buPROPion (WELLBUTRIN SR) 100 MG 12 hr tablet Take 100 mg by mouth daily.    [provider]  carvedilol (COREG) 3.125 MG tablet Take 3.125 mg by mouth at bedtime.    [provider]  clopidogrel (PLAVIX) 75 MG tablet Take 75 mg by mouth daily.    [provider]  ferrous sulfate 325 (65 FE) MG EC tablet Take 325 mg by mouth every other day.    [provider]  FLUoxetine (PROZAC) 20 MG capsule Take 20 mg by mouth daily. Take with Fluoxetine 40 mg to equal 60 mg    [provider]  FLUoxetine (PROZAC) 40 MG capsule Take 40 mg by mouth daily. Take with Fluoxetine 20 mg to equal 60 mg    [provider]  gabapentin (NEURONTIN) 100 MG capsule Take 2 capsules (200 mg total) by mouth 3 (three) times daily. 04/22/18   Nicholes Mango, MD  guaifenesin (ROBITUSSIN) 100 MG/5ML syrup Take 200 mg by mouth 3 (three) times daily as needed for cough.    [provider]  hydrOXYzine (ATARAX/VISTARIL) 10 MG tablet Take 10 mg by mouth in the morning and at bedtime.    [provider]  Ipratropium-Albuterol (COMBIVENT RESPIMAT) 20-100 MCG/ACT AERS respimat Inhale 1 puff into the lungs 3 (three) times daily.    [provider]  mirtazapine (REMERON) 15 MG tablet Take 7.5 mg by mouth at bedtime.    [provider]  montelukast (SINGULAIR) 10 MG tablet Take 10 mg by mouth at bedtime.     [provider]  ondansetron (ZOFRAN) 4 MG tablet Take 1 tablet (4 mg total) by mouth every 6 (six) hours as needed for nausea. 01/22/20   Hoyt Koch, MD  potassium chloride SA (KLOR-CON) 20 MEQ tablet Take 20 mEq by mouth daily.    [provider]  predniSONE (DELTASONE) 10 MG tablet Take 10 mg by mouth every evening.     [provider]  primidone (MYSOLINE) 50 MG tablet Take 50 mg by mouth at bedtime.     [provider]  simvastatin (ZOCOR) 20 MG tablet Take 20 mg by mouth daily.    [provider]  spironolactone (ALDACTONE) 25 MG tablet Take 25 mg by mouth daily.    [provider]  torsemide (DEMADEX) 20 MG tablet Take 40 mg by mouth 2 (two) times daily.    [provider]  umeclidinium-vilanterol (ANORO ELLIPTA) 62.5-25 MCG/INH AEPB Inhale 1 puff into the lungs daily.    [provider]  Physical Exam: Vitals:   08/05/20 0052 08/05/20 0055 08/05/20 0130  BP:  123/73 110/81  Pulse:  63 66  Resp:  11 13  Temp:  98.1 F (36.7 C)   TempSrc:  Oral   SpO2:  99% 100%  Weight: 108.9 kg    Height: 5\' 5"  (1.651 m)       Vitals:   08/05/20 0052 08/05/20 0055 08/05/20 0130  BP:  123/73 110/81  Pulse:  63 66  Resp:  11 13  Temp:  98.1 F (36.7 C)   TempSrc:  Oral   SpO2:  99% 100%  Weight: 108.9 kg    Height: 5\' 5"  (1.651 m)        Constitutional: Alert and oriented x 3 . Not in any apparent distress HEENT:      Head: Normocephalic and atraumatic.         Eyes: PERLA, EOMI, Conjunctivae are normal. Sclera is non-icteric.       Mouth/Throat: Mucous membranes are moist.       Neck: Supple with no signs of meningismus. Cardiovascular: Regular rate and rhythm. No murmurs, gallops, or rubs. 2+ symmetrical distal pulses are present . No JVD. No LE edema Respiratory: Respiratory effort normal .Lungs sounds clear bilaterally. No wheezes, crackles, or rhonchi.  Gastrointestinal: Soft, non tender, and non distended with positive bowel sounds. No rebound or guarding. Genitourinary: No CVA tenderness. Musculoskeletal:  Shortened left lower extremity.  Noted atrophy of left forearm from old injury.  No cyanosis, or erythema of extremities. Neurologic: Normal speech and language. Face is symmetric. Moving all extremities. No gross focal neurologic  deficits . Skin: Skin is warm, dry.  No rash or ulcers Psychiatric: Mood and affect are normal Speech and behavior are normal   Labs on Admission: I have personally reviewed following labs and imaging studies  CBC: Recent Labs  Lab 08/05/20 0054  WBC 12.4*  NEUTROABS 10.5*  HGB 12.2  HCT 36.5  MCV 93.8  PLT 269   Basic Metabolic Panel: Recent Labs  Lab 08/05/20 0054  NA 135  K 4.1  CL 92*  CO2 32  GLUCOSE 137*  BUN 31*  CREATININE 0.83  CALCIUM 8.9   GFR: Estimated Creatinine Clearance: 69.7 mL/min (by C-G formula based on SCr of 0.83 mg/dL). Liver Function Tests: Recent Labs  Lab 08/05/20 0054  AST 17  ALT 19  ALKPHOS 87  BILITOT 1.1  PROT 7.0  ALBUMIN 4.0   No results for input(s): LIPASE, AMYLASE in the last 168 hours. No results for input(s): AMMONIA in the last 168 hours. Coagulation Profile: No results for input(s): INR, PROTIME in the last 168 hours. Cardiac Enzymes: No results for input(s): CKTOTAL, CKMB, CKMBINDEX, TROPONINI in the last 168 hours. BNP (last 3 results) No results for input(s): PROBNP in the last 8760 hours. HbA1C: No results for input(s): HGBA1C in the last 72 hours. CBG: No results for input(s): GLUCAP in the last 168 hours. Lipid Profile: No results for input(s): CHOL, HDL, LDLCALC, TRIG, CHOLHDL, LDLDIRECT in the last 72 hours. Thyroid Function Tests: No results for input(s): TSH, T4TOTAL, FREET4, T3FREE, THYROIDAB in the last 72 hours. Anemia Panel: No results for input(s): VITAMINB12, FOLATE, FERRITIN, TIBC, IRON, RETICCTPCT in the last 72 hours. Urine analysis:    Component Value Date/Time   COLORURINE AMBER (A) 01/16/2020 1723   APPEARANCEUR CLOUDY (A) 01/16/2020 1723   APPEARANCEUR Clear 05/19/2014 2245   LABSPEC 1.018 01/16/2020 1723   LABSPEC 1.008 05/19/2014 2245  PHURINE 5.0 01/16/2020 1723   GLUCOSEU NEGATIVE 01/16/2020 1723   GLUCOSEU Negative 05/19/2014 2245   HGBUR NEGATIVE 01/16/2020 1723    BILIRUBINUR SMALL (A) 01/16/2020 1723   BILIRUBINUR Negative 05/19/2014 2245   KETONESUR NEGATIVE 01/16/2020 1723   PROTEINUR 30 (A) 01/16/2020 1723   UROBILINOGEN 1.0 03/05/2011 1312   NITRITE NEGATIVE 01/16/2020 1723   LEUKOCYTESUR MODERATE (A) 01/16/2020 1723   LEUKOCYTESUR Negative 05/19/2014 2245    Radiological Exams on Admission: DG Chest 1 View  Result Date: 08/05/2020 CLINICAL DATA:  Cough EXAM: CHEST  1 VIEW COMPARISON:  05/20/2020 FINDINGS: Lung volumes are small, however, pulmonary insufflation remain stable since prior examination. Left basilar scarring is unchanged. No superimposed confluent pulmonary infiltrate. No pneumothorax or pleural effusion. Superior mediastinal widening is noted, likely accentuated by supine positioning and poor pulmonary insufflation. Mild cardiomegaly appears stable. Pulmonary vascularity is normal. No acute bone abnormality. 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 IMPRESSION: 1. No acute cardiopulmonary disease. 2. Superior mediastinal widening, likely accentuated by supine positioning and poor pulmonary insufflation. This may be better assessed with a standard two view chest radiograph or CT imaging. Electronically Signed   By: Fidela Salisbury MD   On: 08/05/2020 01:36   DG Hip Unilat W or Wo Pelvis 2-3 Views Left  Result Date: 08/05/2020 CLINICAL DATA:  Left hip pain EXAM: DG HIP (WITH OR WITHOUT PELVIS) 2-3V LEFT COMPARISON:  None. FINDINGS: Two view radiograph of the left hip demonstrates an impacted subcapital left femoral neck fracture. The femoral head is still seated within the acetabulum. There is mild superimposed left hip degenerative arthritis. The osseous structures are diffusely osteopenic, however, the pelvis and sacrum appear intact. Limited evaluation of the right hip demonstrates mild-to-moderate degenerative arthritis. IMPRESSION: Impacted subcapital left femoral neck fracture. Electronically Signed   By: Fidela Salisbury MD    On: 08/05/2020 01:34    EKG: Independently reviewed. Interpretation : Sinus with right bundle branch block  Assessment/Plan 79 year old female, nonambulant at baseline with history of diastolic heart failure, sleep apnea, DM, HTN, hypothyroidism, COPD with chronic respiratory failure on home O2 at 3 L, depression, osteoporosis, obesity and nonambulant at baseline admitted with left hip fracture sustained while transferring with PT at nursing home..    Closed left hip fracture, initial encounter [facility])   Osteoporotic/osteopenic fracture of left hip, initial encounter ([place])   Preoperative clearance -Patient had a nontraumatic, likely osteoporotic/osteopenic fracture of the left hip while transferring with PT -Pain control -Orthopedic consult for further orders -Patient has multiple chronic medical problems including cardiopulmonary issues which all appear to be stable but which nonetheless puts her at moderate risk for perioperative cardiopulmonary complications. -Last echocardiogram was in 2019 with EF 55 to 60% so will place order for echocardiogram and cardiology consult for preoperative cardiac clearance, though can proceed if spinal anesthesia is used    Clinical depression -Continue home meds    Diabetes ([place]) -Sliding scale insulin coverage    Apnea, sleep Obesity -CPAP nightly if desired by patient    Hypothyroidism -Continue home levothyroxine pending med rec    COPD (chronic obstructive pulmonary disease) ([place])   Chronic respiratory failure with hypoxia ([place]) -Oxygen needs stable -DuoNebs as needed and home inhalers pending med rec -Patient followed with pulmonologist, Dr. Raul Del.  Last seen 3 years prior    Chronic diastolic heart failure ([facility]) -Echocardiogram as part of preop clearance -Continue home meds when not n.p.o.   DVT prophylaxis: SCDs Code Status: full code  Family  Communication:  none  Disposition Plan: Back to previous home environment Consults  called: Cardiology, orthopedics Status:At the time of admission, it appears that the appropriate admission status for this patient is INPATIENT. This is judged to be reasonable and necessary in order to provide the required intensity of service to ensure the patient's safety given the presenting symptoms, physical exam findings, and initial radiographic and laboratory data in the context of their  Comorbid conditions.   Patient requires inpatient status due to high intensity of service, high risk for further deterioration and high frequency of surveillance required.   I certify that at the point of admission it is my clinical judgment that the patient will require inpatient hospital care spanning beyond [place] MD [facility]     08/05/2020, 3:18 AM

## 2020-08-05 NOTE — ED Provider Notes (Signed)
[facility] Emergency Department Provider Note   ____________________________________________   First MD Initiated Contact with Patient 08/05/20 [phone]     (approximate)  I have reviewed the triage vital signs and the nursing notes.   HISTORY  Chief Complaint Hip Pain    HPI Sara Pierce is a 79 y.o. female with past medical history of hypertension, diabetes, CHF, COPD on 3 L nasal cannula, and CKD who presents to the ED complaining of hip pain and cough.  Patient arrives from [facility], where she states she felt a pop while transferring with her physical therapist earlier today.  She has also been dealing with a productive cough for about the past month that she states has been getting worse.  She had x-rays of her chest as well as her left hip performed earlier today that were concerning for pneumonia as well as a femoral neck fracture.  Patient currently complains of pain at her left hip but denies other areas of pain.  She denies any fevers, shortness of breath above her baseline, chest pain, abdominal pain, numbness, or weakness.  Patient is nonambulatory at baseline.        Past Medical History:  Diagnosis Date  . Arthritis   . Cardiomyopathy ([place])   . Cataract   . Cerebral hemorrhage ([place])   . CHF (congestive heart failure) ([place])   . Chronic kidney disease   . Clotting disorder ([place])   . COPD (chronic obstructive pulmonary disease) ([place])   . Depression   . Diabetes mellitus without complication ([place])   . Hypertension   . Mixed incontinence   . Obesity   . Sleep apnea   . Thyroid disease   . Urinary frequency   . Vaginal atrophy   . Varicose veins     Patient Active Problem List   Diagnosis Date Noted  . Chronic respiratory failure with hypoxia ([place]) 08/05/2020  . Closed left hip fracture, initial encounter ([place]) 08/05/2020  . Preoperative clearance 08/05/2020  . Osteoporotic fracture of left hip, initial encounter ([place])  08/05/2020  . Shortness of breath   . Palliative care by specialist   . DNR (do not resuscitate)   . Acute on chronic renal insufficiency   . Adult failure to thrive   . C. difficile colitis 01/16/2020  . Perirectal abscess 06/02/2018  . Chronic diastolic heart failure ([place]) 05/12/2018  . NSTEMI (non-ST elevated myocardial infarction) ([place]) 04/15/2018  . COPD (chronic obstructive pulmonary disease) ([place]) 01/04/2018  . Pressure injury of skin 10/25/2017  . Absolute anemia 03/15/2015  . Anxiety 03/15/2015  . Arthritis 03/15/2015  . Cataract 03/15/2015  . Chronic constipation 03/15/2015  . Blood clotting disorder ([place]) 03/15/2015  . CAFL (chronic airflow limitation) ([place]) 03/15/2015  . Clinical depression 03/15/2015  . Diabetes ([place]) 03/15/2015  . H/O varicella 03/15/2015  . BP (high blood pressure) 03/15/2015  . Mixed incontinence 03/15/2015  . 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 Adiposity 03/15/2015  . Apnea, sleep 03/15/2015  . Hypothyroidism 03/15/2015  . FOM (frequency of micturition) 03/15/2015  . 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 Candida vaginitis 03/15/2015  . Abnormal ECG 03/15/2015    Past Surgical History:  Procedure Laterality Date  . APPENDECTOMY    . Cardiac Catherization    . COLONOSCOPY WITH PROPOFOL N/A 04/28/2016   Procedure: COLONOSCOPY WITH PROPOFOL;  Surgeon: Manya Silvas, MD;  Location: [facility] ENDOSCOPY;  Service: Endoscopy;  Laterality: N/A;  . COLONOSCOPY  WITH PROPOFOL N/A 04/29/2016   Procedure: COLONOSCOPY WITH PROPOFOL;  Surgeon: Manya Silvas, MD;  Location: [facility] ENDOSCOPY;  Service: Endoscopy;  Laterality: N/A;  . JOINT REPLACEMENT    . STRABISMUS SURGERY    . TONSILLECTOMY    . TUBAL LIGATION      Prior to Admission medications   Medication Sig Start Date End Date Taking? Authorizing Provider  acetaminophen (TYLENOL) 500 MG tablet Take 1,000 mg by mouth every 6 (six) hours as needed for mild  pain.    [provider]  albuterol (VENTOLIN HFA) 108 (90 Base) MCG/ACT inhaler Inhale 2 puffs into the lungs every 6 (six) hours as needed for wheezing or shortness of breath.    [provider]  ALPRAZolam Duanne Moron) 0.25 MG tablet Take 0.25 mg by mouth 2 (two) times daily.    [provider]  B Complex-C (B-COMPLEX WITH VITAMIN C) tablet Take 1 tablet by mouth daily.    [provider]  budesonide (PULMICORT) 180 MCG/ACT inhaler Inhale 2 puffs into the lungs 2 (two) times daily.    [provider]  buPROPion (WELLBUTRIN SR) 100 MG 12 hr tablet Take 100 mg by mouth daily.    [provider]  carvedilol (COREG) 3.125 MG tablet Take 3.125 mg by mouth at bedtime.    [provider]  clopidogrel (PLAVIX) 75 MG tablet Take 75 mg by mouth daily.    [provider]  ferrous sulfate 325 (65 FE) MG EC tablet Take 325 mg by mouth every other day.    [provider]  FLUoxetine (PROZAC) 20 MG capsule Take 20 mg by mouth daily. Take with Fluoxetine 40 mg to equal 60 mg    [provider]  FLUoxetine (PROZAC) 40 MG capsule Take 40 mg by mouth daily. Take with Fluoxetine 20 mg to equal 60 mg    [provider]  gabapentin (NEURONTIN) 100 MG capsule Take 2 capsules (200 mg total) by mouth 3 (three) times daily. 04/22/18   Nicholes Mango, MD  guaifenesin (ROBITUSSIN) 100 MG/5ML syrup Take 200 mg by mouth 3 (three) times daily as needed for cough.    [provider]  hydrOXYzine (ATARAX/VISTARIL) 10 MG tablet Take 10 mg by mouth in the morning and at bedtime.    [provider]  Ipratropium-Albuterol (COMBIVENT RESPIMAT) 20-100 MCG/ACT AERS respimat Inhale 1 puff into the lungs 3 (three) times daily.    [provider]  mirtazapine (REMERON) 15 MG tablet Take 7.5 mg by mouth at bedtime.    [provider]  montelukast (SINGULAIR) 10 MG tablet Take 10 mg by mouth at bedtime.     [provider]  ondansetron (ZOFRAN) 4 MG tablet Take 1 tablet (4 mg total) by mouth every 6 (six) hours as needed for nausea. 01/22/20   Hoyt Koch, MD  potassium chloride SA (KLOR-CON) 20 MEQ tablet Take 20 mEq by mouth daily.    [provider]  predniSONE (DELTASONE) 10 MG tablet Take 10 mg by mouth every evening.    [provider]  primidone (MYSOLINE) 50 MG tablet Take 50 mg by mouth at bedtime.     [provider]  simvastatin (ZOCOR) 20 MG tablet Take 20 mg by mouth daily.    [provider]  spironolactone (ALDACTONE) 25 MG tablet Take 25 mg by mouth daily.    [provider]  torsemide (DEMADEX) 20 MG tablet Take 40 mg by mouth 2 (two) times daily.  [provider]  umeclidinium-vilanterol (ANORO ELLIPTA) 62.5-25 MCG/INH AEPB Inhale 1 puff into the lungs daily.    [provider]    Allergies Aspirin, Codeine, Naprosyn [naproxen], Other, Tape, Valacyclovir, and Latex  Family History  Problem Relation Age of Onset  . Heart attack Father   . Coronary artery disease Father   . Hypertension Father   . Hyperlipidemia Father   . Heart attack Mother   . Hypertension Mother   . Hyperlipidemia Mother   . Breast cancer Neg Hx     Social History Social History   Tobacco Use  . Smoking status: Former Smoker    Packs/day: 0.50    Years: 44.00    Pack years: 22.00    Types: Cigarettes  . Smokeless tobacco: Never Used  Vaping Use  . Vaping Use: Never used  Substance Use Topics  . Alcohol use: No  . Drug use: No    Review of Systems  Constitutional: No fever/chills Eyes: No visual changes. ENT: No sore throat. Cardiovascular: Denies chest pain. Respiratory: Denies shortness of breath.  Positive for cough. Gastrointestinal: No abdominal pain.  No nausea, no vomiting.  No diarrhea.  No constipation. Genitourinary: Negative for dysuria. Musculoskeletal: Negative for back pain.  Positive for left hip  pain. Skin: Negative for rash. Neurological: Negative for headaches, focal weakness or numbness.  ____________________________________________   PHYSICAL EXAM:  VITAL SIGNS: ED Triage Vitals  Enc Vitals Group     BP      Pulse      Resp      Temp      Temp src      SpO2      Weight      Height      Head Circumference      [place] Flow      Pain Score      Pain Loc      Pain Edu?      Excl. in [place]?     Constitutional: Alert and oriented. Eyes: Conjunctivae are normal. Head: Atraumatic. Nose: No congestion/rhinnorhea. Mouth/Throat: Mucous membranes are moist. Neck: Normal ROM Cardiovascular: Normal rate, regular rhythm. Grossly normal heart sounds.  2+ DP pulses bilaterally. Respiratory: Normal respiratory effort.  No retractions.  Faint expiratory wheezing noted. Gastrointestinal: Soft and nontender. No distention. Genitourinary: deferred Musculoskeletal: Diffuse tenderness of left hip with range of motion limited secondary to pain.  No tenderness at right hip, bilateral knees, or bilateral ankles. Neurologic:  Normal speech and language. No gross focal neurologic deficits are appreciated. Skin:  Skin is warm, dry and intact. No rash noted. Psychiatric: Mood and affect are normal. Speech and behavior are normal.  ____________________________________________   LABS (all labs ordered are listed, but only abnormal results are displayed)  Labs Reviewed  CBC WITH DIFFERENTIAL/PLATELET - Abnormal; Notable for the following components:      Result Value   WBC 12.4 (*)    RDW 15.8 (*)    Neutro Abs 10.5 (*)    Abs Immature Granulocytes 0.18 (*)    All other components within normal limits  COMPREHENSIVE METABOLIC PANEL - Abnormal; Notable for the following components:   Chloride 92 (*)    Glucose, Bld 137 (*)    BUN 31 (*)    All other components within normal limits  BRAIN NATRIURETIC PEPTIDE - Abnormal; Notable for the following components:   B Natriuretic Peptide  143.8 (*)    All other components within normal limits  SARS CORONAVIRUS 2  BY RT PCR (HOSPITAL ORDER, [facility])  SAMPLE TO BLOOD BANK  TROPONIN I (HIGH SENSITIVITY)  TROPONIN I (HIGH SENSITIVITY)   ____________________________________________  EKG  ED ECG REPORT I, Blake Divine, the attending physician, personally viewed and interpreted this ECG.   Date: 08/05/2020  EKG Time: 0:52  Rate: 55  Rhythm: sinus bradycardia  Axis: LAD  Intervals:right bundle branch block  ST&T Change: None   PROCEDURES  Procedure(s) performed (including Critical Care):  Procedures   ____________________________________________   INITIAL IMPRESSION / ASSESSMENT AND PLAN / ED COURSE       79 year old female with past medical history of hypertension, diabetes, CHF, COPD on 3 L nasal cannula, and CKD presents to the ED following apparent left hip injury while transferring with PT earlier today.  She arrives with paperwork showing read of femoral neck fracture on the left and she has significant pain in her left hip on my assessment, but is neurovascularly intact to her left lower extremity.  She has no other apparent injuries from the event, but does report a productive cough for the past month with no associated breathing difficulties.  X-ray was also performed of her chest prior to arrival that was read as concerning for pneumonia.  She is not in any respiratory distress at this time and does not appear septic.  We will recheck x-rays of left hip and chest, labs are pending and we will treat pain with IV morphine.  X-ray of left hip confirms femoral neck fracture, patient reports pain improved following IV morphine.  Chest x-ray shows no convincing evidence of pneumonia and at this point patient's cough seems more likely related to COPD rather than a bacterial pneumonia.  We will treat with DuoNeb but hold off on antibiotics.  Labs otherwise unremarkable.  Case discussed  with Dr. Mack Guise of orthopedics, who request patient be kept n.p.o.  Case discussed with hospitalist for admission.      ____________________________________________   FINAL CLINICAL IMPRESSION(S) / ED DIAGNOSES  Final diagnoses:  Closed fracture of left hip, initial encounter ([place])  Cough     ED Discharge Orders    None       Note:  This document was prepared using Dragon voice recognition software and may include unintentional dictation errors.   Blake Divine, MD 08/05/20 [phone]

## 2020-08-05 NOTE — Consult Note (Addendum)
[facility]  CARDIOLOGY CONSULT NOTE  Patient ID: Sara Pierce MRN: [id number] DOB/AGE: 1941-05-23 79 y.o.  Admit date: 08/05/2020 Referring Physician [place] Primary Physician [place] Primary Cardiologist Nehemiah Massed Reason for Consultation preoperative cardiovascular evaluation  HPI: 79 year old female referred for preoperative cardiovascular evaluation.  The patient has a history of chronic diastolic congestive heart failure, sleep apnea, essential hypertension, and COPD.  The patient currently is a resident at [facility], and is nonambulatory.  Patient was being transferred by physical therapy, when she experienced fracture of her left hip.  Patient was evaluated by orthopedics, and is tentatively scheduled to undergo surgery later today.  The patient denies chest pain.  She experiences chronic exertional dyspnea with underlying history of COPD on home O2 at 3 L by nasal cannula.  Admission labs included slightly elevated BNP of 143.8, and normal high-sensitivity troponin of 13 and 12.  CT revealed sinus rhythm at 63 bpm with right bundle branch block.  Chest x-ray did not reveal any acute cardiopulmonary disease.  Prior 2D echocardiogram 04/16/2018 revealed LVEF 55 to 60%.  Blood pressure is 115/85 with a pulse of 80.  Review of systems complete and found to be negative unless listed above     Past Medical History:  Diagnosis Date  . Arthritis   . Cardiomyopathy ([place])   . Cataract   . Cerebral hemorrhage ([place])   . CHF (congestive heart failure) ([place])   . Chronic kidney disease   . Clotting disorder ([place])   . COPD (chronic obstructive pulmonary disease) ([place])   . Depression   . Diabetes mellitus without complication ([place])   . Hypertension   . Mixed incontinence   . Obesity   . Sleep apnea   . Thyroid disease   . Urinary frequency   . Vaginal atrophy   . Varicose veins     Past Surgical History:  Procedure Laterality Date  . APPENDECTOMY    . Cardiac Catherization     . COLONOSCOPY WITH PROPOFOL N/A 04/28/2016   Procedure: COLONOSCOPY WITH PROPOFOL;  Surgeon: Manya Silvas, MD;  Location: [facility] ENDOSCOPY;  Service: Endoscopy;  Laterality: N/A;  . COLONOSCOPY WITH PROPOFOL N/A 04/29/2016   Procedure: COLONOSCOPY WITH PROPOFOL;  Surgeon: Manya Silvas, MD;  Location: [facility] ENDOSCOPY;  Service: Endoscopy;  Laterality: N/A;  . JOINT REPLACEMENT    . STRABISMUS SURGERY    . TONSILLECTOMY    . TUBAL LIGATION      (Not in a hospital admission)  Social History   Socioeconomic History  . Marital status: Married    Spouse name: Not on file  . Number of children: Not on file  . Years of education: Not on file  . Highest education level: Not on file  Occupational History  . Occupation: retired  Tobacco Use  . Smoking status: Former Smoker    Packs/day: 0.50    Years: 44.00    Pack years: 22.00    Types: Cigarettes  . Smokeless tobacco: Never Used  Vaping Use  . Vaping Use: Never used  Substance and Sexual Activity  . Alcohol use: No  . Drug use: No  . Sexual activity: Never  Other Topics Concern  . Not on file  Social History Narrative  . Not on file   Social Determinants of Health   Financial Resource Strain:   . Difficulty of Paying Living Expenses: Not on file  Food Insecurity:   . Worried About Charity fundraiser in the Last Year: Not on  file  . [place] in the Last Year: Not on file  Transportation Needs:   . Lack of Transportation (Medical): Not on file  . Lack of Transportation (Non-Medical): Not on file  Physical Activity:   . Days of Exercise per Week: Not on file  . Minutes of Exercise per Session: Not on file  Stress:   . Feeling of Stress : Not on file  Social Connections:   . Frequency of Communication with Friends and Family: Not on file  . Frequency of Social Gatherings with Friends and Family: Not on file  . Attends Religious Services: Not on file  . Active Member of Clubs or Organizations: Not on file  .  Attends Archivist Meetings: Not on file  . Marital Status: Not on file  Intimate Partner Violence:   . Fear of Current or Ex-Partner: Not on file  . Emotionally Abused: Not on file  . Physically Abused: Not on file  . Sexually Abused: Not on file    Family History  Problem Relation Age of Onset  . Heart attack Father   . Coronary artery disease Father   . Hypertension Father   . Hyperlipidemia Father   . Heart attack Mother   . Hypertension Mother   . Hyperlipidemia Mother   . Breast cancer Neg Hx       Review of systems complete and found to be negative unless listed above      PHYSICAL EXAM  General: Well developed, well nourished, in no acute distress HEENT:  Normocephalic and atramatic Neck:  No JVD.  Lungs: Clear bilaterally to auscultation and percussion. Heart: HRRR . Normal S1 and S2 without gallops or murmurs.  Abdomen: Bowel sounds are positive, abdomen soft and non-tender  Msk:  Back normal, normal gait. Normal strength and tone for age. Extremities: No clubbing, cyanosis or edema.   Neuro: Alert and oriented X 3. Psych:  Good affect, responds appropriately  Labs:   Lab Results  Component Value Date   WBC 12.4 (H) 08/05/2020   HGB 12.2 08/05/2020   HCT 36.5 08/05/2020   MCV 93.8 08/05/2020   PLT 241 08/05/2020    Recent Labs  Lab 08/05/20 0054  NA 135  K 4.1  CL 92*  CO2 32  BUN 31*  CREATININE 0.83  CALCIUM 8.9  PROT 7.0  BILITOT 1.1  ALKPHOS 87  ALT 19  AST 17  GLUCOSE 137*   Lab Results  Component Value Date   CKTOTAL 51 08/19/2017   CKMB < 0.5 (L) 09/26/2014   TROPONINI <0.03 09/16/2018    Lab Results  Component Value Date   CHOL 181 04/16/2018   Lab Results  Component Value Date   HDL 56 04/16/2018   Lab Results  Component Value Date   LDLCALC 105 (H) 04/16/2018   Lab Results  Component Value Date   TRIG 98 04/16/2018   Lab Results  Component Value Date   CHOLHDL 3.2 04/16/2018   No results found  for: LDLDIRECT    Radiology: DG Chest 1 View  Result Date: 08/05/2020 CLINICAL DATA:  Cough EXAM: CHEST  1 VIEW COMPARISON:  05/20/2020 FINDINGS: Lung volumes are small, however, pulmonary insufflation remain stable since prior examination. Left basilar scarring is unchanged. No superimposed confluent pulmonary infiltrate. No pneumothorax or pleural effusion. Superior mediastinal widening is noted, likely accentuated by supine positioning and poor pulmonary insufflation. Mild cardiomegaly appears stable. Pulmonary vascularity is normal. No acute bone abnormality. Right total  shoulder arthroplasty has been performed. IMPRESSION: 1. No acute cardiopulmonary disease. 2. Superior mediastinal widening, likely accentuated by supine positioning and poor pulmonary insufflation. This may be better assessed with a standard two view chest radiograph or CT imaging. Electronically Signed   By: Fidela Salisbury MD   On: 08/05/2020 01:36   DG Hip Unilat W or Wo Pelvis 2-3 Views Left  Result Date: 08/05/2020 CLINICAL DATA:  Left hip pain EXAM: DG HIP (WITH OR WITHOUT PELVIS) 2-3V LEFT COMPARISON:  None. FINDINGS: Two view radiograph of the left hip demonstrates an impacted subcapital left femoral neck fracture. The femoral head is still seated within the acetabulum. There is mild superimposed left hip degenerative arthritis. The osseous structures are diffusely osteopenic, however, the pelvis and sacrum appear intact. Limited evaluation of the right hip demonstrates mild-to-moderate degenerative arthritis. IMPRESSION: Impacted subcapital left femoral neck fracture. Electronically Signed   By: Fidela Salisbury MD   On: 08/05/2020 01:34    EKG: Sinus rhythm with right bundle branch block at 63 bpm  ASSESSMENT AND PLAN:   1.  Preoperative cardiovascular evaluation.  The patient has no prior history of myocardial infarction, stroke or chronic kidney disease.  The patient currently denies chest pain.  ECG reveals sinus rhythm  with right bundle branch block.  BNP slightly elevated, high-sensitivity troponin normal.  The patient should be at acceptable cardiovascular risk for surgery for left hip fracture.  2.  Chronic diastolic congestive heart failure, patient appears euvolemic 3.  COPD, on 3 L O2 by nasal cannula, with recent history of productive cough, chest x-ray unremarkable, poses respiratory risk for general anesthesia 4.  Left hip fracture  Recommendations  1.  Agree with current therapy 2.  Continue carvedilol pre-, peri and postoperatively 3.  Defer functional study 4.  Continue torsemide and spironolactone 5.  Review 2D echocardiogram 6.  Hold clopidogrel  Signed: Isaias Cowman MD,PhD, [facility] 08/05/2020, 7:59 AM

## 2020-08-05 NOTE — Progress Notes (Signed)
PROGRESS NOTE    Sara Pierce  QPY:[id number] DOB: June 12, 1941 DOA: 08/05/2020 PCP: Marygrace Drought, MD      Brief Narrative:  Sara Pierce is a 79 y.o. F with obesity, COPD and chronic respiratory failure on 3 L O2 at home and on chornic prednisone, OSA not on CPAP, DM, HTN, depression, osteoporosis, obesity, and bedbound who presented with left hip fracture sustained while transferring with physical therapy at her nursing home.      Assessment & Plan:  Closed left hip fracture in non-ambulatory patient Orthopedics feel fixation would provide pain relief and stabilize the hip to facilitate nursing care.  -Consult orthopedics, appreciate recommendations -Schedule Tylenol -Judicious use of opiates   Chronic diastolic CHF -Lasix IV x1 -Resume torsemide and spironolactone tomorrow -Continue carvedilol perioperatively  Chronic hypoxic respiratory failure due to COPD Obesity hypoventilation syndrome The patient's pain control will be problematic due to her obstructive and restrictive lung disease, and poor respiratory reserve.  I had a frank discussion with her's husband about impossibility to strike a balance between pain relief and respiratory suppression.  I suspect her increased hypoxia this morning was in large part due to effects of morphine in setting of advanced COPD, obesity hypoventilation, and untreated sleep apnea.  Reasonable to give judicious lasix and try to optimize her obstruction/bronchospastic lung disease, appreciate Pulmonary involvement. -IV steroids for few days, then transition back to home prednisone 10 mg daily -Nebs ordered -Extubate to BiPAP after surgery -Continue Singulair -Hold Xanax   Sleep apnea Patient refuses CPAP.  Discussed with husband, he will try to persuade patient  Diabetes, type II, with polyneuropathy, non-insulin-dependent Glucose is well controlled, for now, monitor on steroids -Continue sliding scale corrections -Continue  simvastatin  Hypertension No prior history of vascular disease -Continue carvedilol perioperatively -Hold Plavix  Mood disorder -Continue bupropion, fluoxetine, mirtazapine  Obesity BMI 39 with diabetes and hypertension  Tremor -Continue primidone               Disposition: Status is: Inpatient  Remains inpatient appropriate because:has hip fracture requiring operative repair   Dispo: The patient is from: SNF              Anticipated d/c is to: SNF              Anticipated d/c date is: 3 days              Patient currently is not medically stable to d/c.              MDM: This is a no charge note.  For further details, please see H&P by my partner Dr. Dr. Damita Dunnings from earlier today.  The below labs and imaging reports were reviewed and summarized above.    DVT prophylaxis: SCDs Start: 08/05/20 0315  Code Status: FULL Family Communication: husband at the bedside    Consultants:   [facility]  Cardiology  Pulmonology  Procedures:     Antimicrobials:      Culture data:              Subjective: The patient still has pain in the left hip, severely.  No confusion, dyspnea, sputum, fever.        Objective: Vitals:   08/05/20 0930 08/05/20 1000 08/05/20 1029 08/05/20 1406  BP: (!) 142/83 (!) 134/56 (!) 111/95 (!) 139/110  Pulse: (!) 103 (!) 53 71 82  Resp: 11 13 18 18   Temp:   (!) 97.1 F (36.2 C) 98.6 F (37 C)  TempSrc:   Tympanic   SpO2: 100% 100% 98% 98%  Weight:      Height:       No intake or output data in the 24 hours ending 08/05/20 1616 Filed Weights   08/05/20 0052  Weight: 108.9 kg    Examination: The patient was seen and examined.      Data Reviewed: I have personally reviewed following labs and imaging studies:  CBC: Recent Labs  Lab 08/05/20 0054  WBC 12.4*  NEUTROABS 10.5*  HGB 12.2  HCT 36.5  MCV 93.8  PLT 536   Basic Metabolic Panel: Recent Labs  Lab 08/05/20 0054  NA 135   K 4.1  CL 92*  CO2 32  GLUCOSE 137*  BUN 31*  CREATININE 0.83  CALCIUM 8.9   GFR: Estimated Creatinine Clearance: 69.7 mL/min (by C-G formula based on SCr of 0.83 mg/dL). Liver Function Tests: Recent Labs  Lab 08/05/20 0054  AST 17  ALT 19  ALKPHOS 87  BILITOT 1.1  PROT 7.0  ALBUMIN 4.0   No results for input(s): LIPASE, AMYLASE in the last 168 hours. No results for input(s): AMMONIA in the last 168 hours. Coagulation Profile: No results for input(s): INR, PROTIME in the last 168 hours. Cardiac Enzymes: No results for input(s): CKTOTAL, CKMB, CKMBINDEX, TROPONINI in the last 168 hours. BNP (last 3 results) No results for input(s): PROBNP in the last 8760 hours. HbA1C: No results for input(s): HGBA1C in the last 72 hours. CBG: Recent Labs  Lab 08/05/20 0405 08/05/20 0710 08/05/20 1032  GLUCAP 123* 106* 93   Lipid Profile: No results for input(s): CHOL, HDL, LDLCALC, TRIG, CHOLHDL, LDLDIRECT in the last 72 hours. Thyroid Function Tests: No results for input(s): TSH, T4TOTAL, FREET4, T3FREE, THYROIDAB in the last 72 hours. Anemia Panel: No results for input(s): VITAMINB12, FOLATE, FERRITIN, TIBC, IRON, RETICCTPCT in the last 72 hours. Urine analysis:    Component Value Date/Time   COLORURINE AMBER (A) 01/16/2020 1723   APPEARANCEUR CLOUDY (A) 01/16/2020 1723   APPEARANCEUR Clear 05/19/2014 2245   LABSPEC 1.018 01/16/2020 1723   LABSPEC 1.008 05/19/2014 2245   PHURINE 5.0 01/16/2020 1723   GLUCOSEU NEGATIVE 01/16/2020 1723   GLUCOSEU Negative 05/19/2014 2245   HGBUR NEGATIVE 01/16/2020 1723   BILIRUBINUR SMALL (A) 01/16/2020 1723   BILIRUBINUR Negative 05/19/2014 2245   KETONESUR NEGATIVE 01/16/2020 1723   PROTEINUR 30 (A) 01/16/2020 1723   UROBILINOGEN 1.0 03/05/2011 1312   NITRITE NEGATIVE 01/16/2020 1723   LEUKOCYTESUR MODERATE (A) 01/16/2020 1723   LEUKOCYTESUR Negative 05/19/2014 2245   Sepsis  Labs: @LABRCNTIP (procalcitonin:4,lacticacidven:4)  ) Recent Results (from the past 240 hour(s))  SARS Coronavirus 2 by RT PCR (hospital order, performed in [place] hospital lab) Nasopharyngeal Nasopharyngeal Swab     Status: None   Collection Time: 08/05/20 12:54 AM   Specimen: Nasopharyngeal Swab  Result Value Ref Range Status   SARS Coronavirus 2 NEGATIVE NEGATIVE Final    Comment: (NOTE) SARS-CoV-2 target nucleic acids are NOT DETECTED.  The SARS-CoV-2 RNA is generally detectable in upper and lower respiratory specimens during the acute phase of infection. The lowest concentration of SARS-CoV-2 viral copies this assay can detect is 250 copies / mL. A negative result does not preclude SARS-CoV-2 infection and should not be used as the sole basis for treatment or other patient management decisions.  A negative result may occur with improper specimen collection / handling, submission of specimen other than nasopharyngeal swab, presence of viral mutation(s) within the  areas targeted by this assay, and inadequate number of viral copies (<250 copies / mL). A negative result must be combined with clinical observations, patient history, and epidemiological information.  Fact Sheet for Patients:   StrictlyIdeas.no  Fact Sheet for Healthcare Providers: BankingDealers.co.za  This test is not yet approved or  cleared by the [place] FDA and has been authorized for detection and/or diagnosis of SARS-CoV-2 by FDA under an Emergency Use Authorization (EUA).  This EUA will remain in effect (meaning this test can be used) for the duration of the COVID-19 declaration under Section 564(b)(1) of the Act, 21 U.S.C. section 360bbb-3(b)(1), unless the authorization is terminated or revoked sooner.  Performed at [facility], [address], [place], [place] [address]          Radiology Studies: DG Chest 1 View  Result  Date: 08/05/2020 CLINICAL DATA:  Cough EXAM: CHEST  1 VIEW COMPARISON:  05/20/2020 FINDINGS: Lung volumes are small, however, pulmonary insufflation remain stable since prior examination. Left basilar scarring is unchanged. No superimposed confluent pulmonary infiltrate. No pneumothorax or pleural effusion. Superior mediastinal widening is noted, likely accentuated by supine positioning and poor pulmonary insufflation. Mild cardiomegaly appears stable. Pulmonary vascularity is normal. No acute bone abnormality. 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 IMPRESSION: 1. No acute cardiopulmonary disease. 2. Superior mediastinal widening, likely accentuated by supine positioning and poor pulmonary insufflation. This may be better assessed with a standard two view chest radiograph or CT imaging. Electronically Signed   By: Fidela Salisbury MD   On: 08/05/2020 01:36   DG Hip Unilat W or Wo Pelvis 2-3 Views Left  Result Date: 08/05/2020 CLINICAL DATA:  Left hip pain EXAM: DG HIP (WITH OR WITHOUT PELVIS) 2-3V LEFT COMPARISON:  None. FINDINGS: Two view radiograph of the left hip demonstrates an impacted subcapital left femoral neck fracture. The femoral head is still seated within the acetabulum. There is mild superimposed left hip degenerative arthritis. The osseous structures are diffusely osteopenic, however, the pelvis and sacrum appear intact. Limited evaluation of the right hip demonstrates mild-to-moderate degenerative arthritis. IMPRESSION: Impacted subcapital left femoral neck fracture. Electronically Signed   By: Fidela Salisbury MD   On: 08/05/2020 01:34        Scheduled Meds: . acetaminophen  1,000 mg Oral TID  . budesonide  0.25 mg Inhalation BID  . buPROPion  100 mg Oral Daily  . carvedilol  3.125 mg Oral QHS  . dextromethorphan-guaiFENesin  1 tablet Oral BID  . FLUoxetine  60 mg Oral Daily  . gabapentin  200 mg Oral TID  . insulin aspart  0-20 Units Subcutaneous Q4H  .  ipratropium-albuterol  3 mL Inhalation TID  . melatonin  5 mg Oral QHS  . methylPREDNISolone (SOLU-MEDROL) injection  40 mg Intravenous Q24H  . mirtazapine  7.5 mg Oral QHS  . montelukast  10 mg Oral QHS  . potassium chloride SA  20 mEq Oral Daily  . primidone  50 mg Oral QHS  . simvastatin  20 mg Oral Daily  . spironolactone  25 mg Oral Daily  . torsemide  40 mg Oral BID   Continuous Infusions:    LOS: 0 days    Time spent: 20 minutes    Edwin Dada, MD [facility] 08/05/2020, 4:16 PM     Please page though [place] or Epic secure chat:  For password, contact charge nurse

## 2020-08-06 ENCOUNTER — Ambulatory Visit: Admission: RE | Admit: 2020-08-06 | Payer: Medicare Other | Source: Ambulatory Visit

## 2020-08-06 DIAGNOSIS — J9611 Chronic respiratory failure with hypoxia: Secondary | ICD-10-CM

## 2020-08-06 DIAGNOSIS — M80052A Age-related osteoporosis with current pathological fracture, left femur, initial encounter for fracture: Principal | ICD-10-CM

## 2020-08-06 DIAGNOSIS — Z01818 Encounter for other preprocedural examination: Secondary | ICD-10-CM

## 2020-08-06 DIAGNOSIS — F329 Major depressive disorder, single episode, unspecified: Secondary | ICD-10-CM

## 2020-08-06 LAB — CBC
HCT: 36.2 % (ref 36.0–46.0)
Hemoglobin: 12 g/dL (ref 12.0–15.0)
MCH: 31.9 pg (ref 26.0–34.0)
MCHC: 33.1 g/dL (ref 30.0–36.0)
MCV: 96.3 fL (ref 80.0–100.0)
Platelets: 224 10*3/uL (ref 150–400)
RBC: 3.76 MIL/uL — ABNORMAL LOW (ref 3.87–5.11)
RDW: 15.9 % — ABNORMAL HIGH (ref 11.5–15.5)
WBC: 11.3 10*3/uL — ABNORMAL HIGH (ref 4.0–10.5)
nRBC: 0.5 % — ABNORMAL HIGH (ref 0.0–0.2)

## 2020-08-06 LAB — BASIC METABOLIC PANEL
Anion gap: 9 (ref 5–15)
BUN: 29 mg/dL — ABNORMAL HIGH (ref 8–23)
CO2: 34 mmol/L — ABNORMAL HIGH (ref 22–32)
Calcium: 9.2 mg/dL (ref 8.9–10.3)
Chloride: 98 mmol/L (ref 98–111)
Creatinine, Ser: 0.78 mg/dL (ref 0.44–1.00)
GFR calc Af Amer: >60 mL/min (ref >60)
GFR calc non Af Amer: >60 mL/min (ref >60)
Glucose, Bld: 102 mg/dL — ABNORMAL HIGH (ref 70–99)
Potassium: 3.6 mmol/L (ref 3.5–5.1)
Sodium: 141 mmol/L (ref 135–145)

## 2020-08-06 LAB — GLUCOSE, CAPILLARY
Glucose-Capillary: 110 mg/dL — ABNORMAL HIGH (ref 70–99)
Glucose-Capillary: 110 mg/dL — ABNORMAL HIGH (ref 70–99)
Glucose-Capillary: 123 mg/dL — ABNORMAL HIGH (ref 70–99)
Glucose-Capillary: 179 mg/dL — ABNORMAL HIGH (ref 70–99)
Glucose-Capillary: 213 mg/dL — ABNORMAL HIGH (ref 70–99)

## 2020-08-06 MED ORDER — GUAIFENESIN-DM 100-10 MG/5ML PO SYRP
5.0000 mL | ORAL_SOLUTION | Freq: Four times a day (QID) | ORAL | Status: DC
  Administered 2020-08-06 – 2020-08-12 (×23): 5 mL via ORAL
  Filled 2020-08-06 (×23): qty 5

## 2020-08-06 MED ORDER — ENSURE ENLIVE PO LIQD
237.0000 mL | Freq: Two times a day (BID) | ORAL | Status: DC
  Administered 2020-08-06 – 2020-08-12 (×10): 237 mL via ORAL

## 2020-08-06 MED ORDER — OCUVITE-LUTEIN PO CAPS
1.0000 | ORAL_CAPSULE | Freq: Every day | ORAL | Status: DC
  Administered 2020-08-06 – 2020-08-12 (×6): 1 via ORAL
  Filled 2020-08-06 (×7): qty 1

## 2020-08-06 MED ORDER — OXYMETAZOLINE HCL 0.05 % NA SOLN
1.0000 | Freq: Two times a day (BID) | NASAL | Status: AC
  Administered 2020-08-06 – 2020-08-07 (×3): 1 via NASAL
  Filled 2020-08-06: qty 15

## 2020-08-06 MED ORDER — FLUTICASONE PROPIONATE 50 MCG/ACT NA SUSP
1.0000 | Freq: Two times a day (BID) | NASAL | Status: DC
  Administered 2020-08-06 – 2020-08-12 (×11): 1 via NASAL
  Filled 2020-08-06: qty 16

## 2020-08-06 MED ORDER — METHYLPREDNISOLONE SODIUM SUCC 125 MG IJ SOLR
40.0000 mg | INTRAMUSCULAR | Status: DC
  Administered 2020-08-06 – 2020-08-08 (×3): 40 mg via INTRAVENOUS
  Filled 2020-08-06 (×3): qty 2

## 2020-08-06 NOTE — Progress Notes (Signed)
PROGRESS NOTE    Sara Pierce  ZGY:[id number] DOB: [birth year] DOA: 08/05/2020 PCP: Marygrace Drought, MD    Brief Narrative:  Patient admitted to the hospital with the working diagnosis of acute left hip fracture.  79 year old female with past medical history for diastolic heart failure, sleep apnea, type 2 diabetes mellitus, hypertension, hypothyroidism, COPD, chronic hypoxic respiratory failure, depression, osteoporosis, obesity and nonambulatory state. Patient is a nursing home resident, while doing physical therapy she felt a pop in her left hip, further radiographic evaluation revealed left hip fracture. On her initial physical examination blood pressure 123/73, heart rate 63, respiratory rate 11, temperature 98.1, oxygen saturation 99%. Her lungs had decreased breath sounds bilaterally no wheezing, rales or rhonchi, heart S1-S2, present rhythmic, abdomen soft, no lower extremity edema. SARS COVID-19 negative. Left hip films with impacted subcapital left femoral neck fracture. Chest radiograph with left rotation, increased lung markings bilaterally, no infiltrates. EKG 55 bpm, normal axis, right bundle branch block, sinus with sinus arrhythmia, no ST segment or T wave changes, poor baseline    Assessment & Plan:   Principal Problem:   Closed left hip fracture, initial encounter ([place]) Active Problems:   Clinical depression   Diabetes (HCC)   Apnea, sleep   Hypothyroidism   COPD (chronic obstructive pulmonary disease) (HCC)   Chronic diastolic heart failure (HCC)   Chronic respiratory failure with hypoxia (HCC)   Preoperative clearance   Osteoporotic fracture of left hip, initial encounter (HCC)   1. Closed left hip fracture. Patient continue to have left hip pain, worse with movement.  Continue pain control with acetaminophen, gabapentin, oxycodone and morphine.   2. COPD exacerbation with acute on chronic hypoxic respiratory failure. Patient continue to have wheezing and  dyspnea.   On bronchodilator therapy with duoneb and albuterol. Continue antitussive agents and will add flonase and affrin for nasal congestion. Increase IV methylprednisolone to 40 mg IV q12. Add incentive spirometry and flutter valve.   3. Diastolic heart failure. No clinical signs of hypervolemia. Continue diuretic therapy with torsemide and spironolactone.  4. Uncontrolled T2DM. Dyslipidemia. Continue glucose cover and monitoring with insulin sliding scale.  Patient with poor oral intake.   5. Hypokalemia. Continue K correction with Kcl. Renal function with serum cr at 0,78.   6. HTN. Continue blood pressure control with carvedilol.    Patient continue to be at high risk for worsening COPD exacerbation   Status is: Inpatient  Remains inpatient appropriate because:IV treatments appropriate due to intensity of illness or inability to take PO   Dispo: The patient is from: SNF              Anticipated d/c is to: SNF              Anticipated d/c date is: 3 days              Patient currently is not medically stable to d/c.  DVT prophylaxis: Enoxaparin   Code Status:   full  Family Communication:  No family at the bedside      Nutrition Status: Nutrition Problem: Increased nutrient needs Etiology: post-op healing (planned percutaneous fixation of left femoral hip fracture) Signs/Symptoms: estimated needs Interventions: MVI, Ensure Enlive (each supplement provides 350kcal and 20 grams of protein)       Consultants:   Orthopedics  Cardiology   Pulmonary   Subjective: Patient with persistent dyspnea, cough and wheezing, no nausea or vomiting, positive nasal congestion.   Objective: Vitals:   08/05/20 2350  08/06/20 0415 08/06/20 0831 08/06/20 1128  BP: 109/82 (!) 148/88 123/86 112/77  Pulse: (!) 51 78 75 96  Resp: 16 16 15 17   Temp: 97.6 F (36.4 C) 97.7 F (36.5 C) 97.6 F (36.4 C) 98.3 F (36.8 C)  TempSrc: Oral Oral Oral Oral  SpO2: 100% 99% 98% 95%    Weight:      Height:        Intake/Output Summary (Last 24 hours) at 08/06/2020 1608 Last data filed at 08/06/2020 1450 Gross per 24 hour  Intake 240 ml  Output 300 ml  Net -60 ml   Filed Weights   08/05/20 0052  Weight: 108.9 kg    Examination:   General: Not in pain or dyspnea, deconditioned  Neurology: Awake and alert, non focal  E ENT: mild pallor, no icterus, oral mucosa moist Cardiovascular: No JVD. S1-S2 present, rhythmic, no gallops, rubs, or murmurs. No lower extremity edema. Pulmonary decreased breath sounds bilaterally, positive expiratory wheezing, with scattered rhonchi and rales. Gastrointestinal. Abdomen soft and non tender Skin. No rashes Musculoskeletal: no joint deformities     Data Reviewed: I have personally reviewed following labs and imaging studies  CBC: Recent Labs  Lab 08/05/20 0054 08/06/20 0549  WBC 12.4* 11.3*  NEUTROABS 10.5*  --   HGB 12.2 12.0  HCT 36.5 36.2  MCV 93.8 96.3  PLT 241 301   Basic Metabolic Panel: Recent Labs  Lab 08/05/20 0054 08/06/20 0549  NA 135 141  K 4.1 3.6  CL 92* 98  CO2 32 34*  GLUCOSE 137* 102*  BUN 31* 29*  CREATININE 0.83 0.78  CALCIUM 8.9 9.2   GFR: Estimated Creatinine Clearance: 72.3 mL/min (by C-G formula based on SCr of 0.78 mg/dL). Liver Function Tests: Recent Labs  Lab 08/05/20 0054  AST 17  ALT 19  ALKPHOS 87  BILITOT 1.1  PROT 7.0  ALBUMIN 4.0   No results for input(s): LIPASE, AMYLASE in the last 168 hours. No results for input(s): AMMONIA in the last 168 hours. Coagulation Profile: No results for input(s): INR, PROTIME in the last 168 hours. Cardiac Enzymes: No results for input(s): CKTOTAL, CKMB, CKMBINDEX, TROPONINI in the last 168 hours. BNP (last 3 results) No results for input(s): PROBNP in the last 8760 hours. HbA1C: Recent Labs    08/05/20 0413  HGBA1C 5.4   CBG: Recent Labs  Lab 08/05/20 1731 08/05/20 2018 08/06/20 0416 08/06/20 0751 08/06/20 1128   GLUCAP 165* 162* 110* 110* 123*   Lipid Profile: No results for input(s): CHOL, HDL, LDLCALC, TRIG, CHOLHDL, LDLDIRECT in the last 72 hours. Thyroid Function Tests: No results for input(s): TSH, T4TOTAL, FREET4, T3FREE, THYROIDAB in the last 72 hours. Anemia Panel: No results for input(s): VITAMINB12, FOLATE, FERRITIN, TIBC, IRON, RETICCTPCT in the last 72 hours.    Radiology Studies: I have reviewed all of the imaging during this hospital visit personally     Scheduled Meds: . acetaminophen  1,000 mg Oral TID  . budesonide  0.25 mg Inhalation BID  . buPROPion  100 mg Oral Daily  . carvedilol  3.125 mg Oral QHS  . dextromethorphan-guaiFENesin  1 tablet Oral BID  . feeding supplement (ENSURE ENLIVE)  237 mL Oral BID BM  . FLUoxetine  60 mg Oral Daily  . gabapentin  200 mg Oral TID  . insulin aspart  0-20 Units Subcutaneous Q4H  . ipratropium-albuterol  3 mL Inhalation TID  . melatonin  5 mg Oral QHS  . methylPREDNISolone (SOLU-MEDROL) injection  40 mg Intravenous Q24H  . mirtazapine  7.5 mg Oral QHS  . montelukast  10 mg Oral QHS  . multivitamin-lutein  1 capsule Oral Daily  . potassium chloride SA  20 mEq Oral Daily  . primidone  50 mg Oral QHS  . simvastatin  20 mg Oral Daily  . spironolactone  25 mg Oral Daily  . torsemide  40 mg Oral BID   Continuous Infusions:   LOS: 1 day        Sara Pierce Gerome Apley, MD

## 2020-08-06 NOTE — Progress Notes (Signed)
Patient removed IV access .  

## 2020-08-06 NOTE — Plan of Care (Signed)
  Problem: Education: Goal: Knowledge of General Education information will improve Description Including pain rating scale, medication(s)/side effects and non-pharmacologic comfort measures Outcome: Progressing   

## 2020-08-06 NOTE — Progress Notes (Signed)
Initial Nutrition Assessment  DOCUMENTATION CODES:   Obesity unspecified  INTERVENTION:  Ensure Enlive po BID, each supplement provides 350 kcal and 20 grams of protein (chocolate)  Ocuvite daily for wound healing (provides zinc, vitamin A, vitamin C, Vitamin E, copper, and selenium)  NUTRITION DIAGNOSIS:   Increased nutrient needs related to post-op healing (planned percutaneous fixation of left femoral hip fracture) as evidenced by estimated needs.  GOAL:   Patient will meet greater than or equal to 90% of their needs   MONITOR:   PO intake, Weight trends, Labs, I & O's, Supplement acceptance, Skin, Diet advancement  REASON FOR ASSESSMENT:   Consult Hip fracture protocol  ASSESSMENT:  RD working remotely.  79 year old female admitted for left hip fracture presented from SNF with reports of feeling a pop in left hip while transferring with PT. Past medical history significant for dCHF, OSA, DM2, HTN, hypothyroidism, COPD with chronic respiratory failure on 3L, depression, osteoporosis, obesity, and nonambulatory at baseline.  Patient is s/p preoperative cardiovascular evaluation this morning, awaiting surgery which is delayed while holding Plavix. Able to speak with pt via phone this morning, reports nurse recently gave her something for pain and feeling much better. She recalls eating most of her eggs and muffin for breakfast. Patient endorses good po intake at facility, eats 3 meals/day. RD discussed the importance of adequate nutrition to support post-op healing. Patient amenable to drinking Ensure supplement during admission to aid with meeting needs, reports she use to drink them and likes the chocolate flavor.   Current wt 239.58 lb Per chart she weighed 116.7 kg (256.74 lb) on 01/21/20, indicating a 17 lb (6.7%) wt loss in the last 6 months; insignificant. No documented weights in 2020 for review.   Medications reviewed and include: Gabapentin, SSI, Methylprednisolone,  Remeron, Klor-con, Primidone, Torsemide Labs: CBGs [phone] Lab Results  Component Value Date   HGBA1C 5.4 08/05/2020    NUTRITION - FOCUSED PHYSICAL EXAM: Unable to complete at this time, RD working remotely.  Diet Order:   Diet Order            Diet heart healthy/carb modified Room service appropriate? Yes; Fluid consistency: Thin  Diet effective now                 EDUCATION NEEDS:   Education needs have been addressed  Skin:  Skin Assessment: Reviewed RN Assessment  Last BM:  unknown  Height:   Ht Readings from Last 1 Encounters:  08/05/20 5\' 5"  (1.651 m)    Weight:   Wt Readings from Last 1 Encounters:  08/05/20 108.9 kg    Ideal Body Weight:  56.8 kg  BMI:  Body mass index is 39.94 kg/m.  Estimated Nutritional Needs:   Kcal:  1900-2100  Protein:  95-105  Fluid:  >/= 1.9 L   Lajuan Lines, RD, LDN Clinical Nutrition After Hours/Weekend Pager # in [place]

## 2020-08-06 NOTE — Progress Notes (Signed)
[facility] Cardiology  SUBJECTIVE: Patient laying in bed, denies chest pain   Vitals:   08/05/20 2028 08/05/20 2350 08/06/20 0415 08/06/20 0831  BP: (!) 136/59 109/82 (!) 148/88 123/86  Pulse: 61 (!) 51 78 75  Resp: 16 16 16 15   Temp: (!) 97.5 F (36.4 C) 97.6 F (36.4 C) 97.7 F (36.5 C) 97.6 F (36.4 C)  TempSrc: Oral Oral Oral Oral  SpO2:  100% 99% 98%  Weight:      Height:        No intake or output data in the 24 hours ending 08/06/20 0838    PHYSICAL EXAM  General: Well developed, well nourished, in no acute distress HEENT:  Normocephalic and atramatic Neck:  No JVD.  Lungs: Clear bilaterally to auscultation and percussion. Heart: HRRR . Normal S1 and S2 without gallops or murmurs.  Abdomen: Bowel sounds are positive, abdomen soft and non-tender  Msk:  Back normal, normal gait. Normal strength and tone for age. Extremities: No clubbing, cyanosis or edema.   Neuro: Alert and oriented X 3. Psych:  Good affect, responds appropriately   LABS: Basic Metabolic Panel: Recent Labs    08/05/20 0054 08/06/20 0549  NA 135 141  K 4.1 3.6  CL 92* 98  CO2 32 34*  GLUCOSE 137* 102*  BUN 31* 29*  CREATININE 0.83 0.78  CALCIUM 8.9 9.2   Liver Function Tests: Recent Labs    08/05/20 0054  AST 17  ALT 19  ALKPHOS 87  BILITOT 1.1  PROT 7.0  ALBUMIN 4.0   No results for input(s): LIPASE, AMYLASE in the last 72 hours. CBC: Recent Labs    08/05/20 0054 08/06/20 0549  WBC 12.4* 11.3*  NEUTROABS 10.5*  --   HGB 12.2 12.0  HCT 36.5 36.2  MCV 93.8 96.3  PLT 241 224   Cardiac Enzymes: No results for input(s): CKTOTAL, CKMB, CKMBINDEX, TROPONINI in the last 72 hours. BNP: Invalid input(s): POCBNP D-Dimer: No results for input(s): DDIMER in the last 72 hours. Hemoglobin A1C: Recent Labs    08/05/20 0413  HGBA1C 5.4   Fasting Lipid Panel: No results for input(s): CHOL, HDL, LDLCALC, TRIG, CHOLHDL, LDLDIRECT in the last 72 hours. Thyroid Function Tests: No  results for input(s): TSH, T4TOTAL, T3FREE, THYROIDAB in the last 72 hours.  Invalid input(s): FREET3 Anemia Panel: No results for input(s): VITAMINB12, FOLATE, FERRITIN, TIBC, IRON, RETICCTPCT in the last 72 hours.  DG Chest 1 View  Result Date: 08/05/2020 CLINICAL DATA:  Cough EXAM: CHEST  1 VIEW COMPARISON:  05/20/2020 FINDINGS: Lung volumes are small, however, pulmonary insufflation remain stable since prior examination. Left basilar scarring is unchanged. No superimposed confluent pulmonary infiltrate. No pneumothorax or pleural effusion. Superior mediastinal widening is noted, likely accentuated by supine positioning and poor pulmonary insufflation. Mild cardiomegaly appears stable. Pulmonary vascularity is normal. No acute bone abnormality. 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 IMPRESSION: 1. No acute cardiopulmonary disease. 2. Superior mediastinal widening, likely accentuated by supine positioning and poor pulmonary insufflation. This may be better assessed with a standard two view chest radiograph or CT imaging. Electronically Signed   By: Fidela Salisbury MD   On: 08/05/2020 01:36   DG Hip Unilat W or Wo Pelvis 2-3 Views Left  Result Date: 08/05/2020 CLINICAL DATA:  Left hip pain EXAM: DG HIP (WITH OR WITHOUT PELVIS) 2-3V LEFT COMPARISON:  None. FINDINGS: Two view radiograph of the left hip demonstrates an impacted subcapital left femoral neck fracture. The femoral head  is still seated within the acetabulum. There is mild superimposed left hip degenerative arthritis. The osseous structures are diffusely osteopenic, however, the pelvis and sacrum appear intact. Limited evaluation of the right hip demonstrates mild-to-moderate degenerative arthritis. IMPRESSION: Impacted subcapital left femoral neck fracture. Electronically Signed   By: Fidela Salisbury MD   On: 08/05/2020 01:34     Echo LVEF 55 to 65% by 2D echocardiogram 04/16/2018  TELEMETRY:  ASSESSMENT AND  PLAN:  Principal Problem:   Closed left hip fracture, initial encounter [facility]) Active Problems:   Clinical depression   Diabetes ([place])   Apnea, sleep   Hypothyroidism   COPD (chronic obstructive pulmonary disease) ([place])   Chronic diastolic heart failure (HCC)   Chronic respiratory failure with hypoxia (HCC)   Preoperative clearance   Osteoporotic fracture of left hip, initial encounter ([place])    1.  Preoperative cardiovascular evaluation.  The patient has no prior history of myocardial infarction, stroke or chronic kidney disease.  The patient currently denies chest pain.  ECG reveals sinus rhythm with right bundle branch block.  BNP slightly elevated, high-sensitivity troponin normal.  The patient should be at acceptable cardiovascular risk for surgery for left hip fracture.  2.  Chronic diastolic congestive heart failure, patient appears euvolemic 3.  COPD, on 3 L O2 by nasal cannula, with recent history of productive cough, chest x-ray unremarkable, moderate risk for respiratory failure following extubation, evaluated and followed by Dr. Lanney Gins who recommends extubation to CPAP/BiPAP 4.  Left hip fracture, awaiting surgery which is delayed while holding Plavix  Recommendations  1.  Agree with current therapy 2.  Continue carvedilol pre-, peri and postoperatively 3.  Defer functional study 4.  Continue torsemide and spironolactone 5.  Hold clopidogrel   Sara Cowman, MD, PhD, [facility] 08/06/2020 8:38 AM

## 2020-08-07 DIAGNOSIS — I5032 Chronic diastolic (congestive) heart failure: Secondary | ICD-10-CM

## 2020-08-07 DIAGNOSIS — E039 Hypothyroidism, unspecified: Secondary | ICD-10-CM

## 2020-08-07 LAB — GLUCOSE, CAPILLARY
Glucose-Capillary: 103 mg/dL — ABNORMAL HIGH (ref 70–99)
Glucose-Capillary: 138 mg/dL — ABNORMAL HIGH (ref 70–99)
Glucose-Capillary: 157 mg/dL — ABNORMAL HIGH (ref 70–99)
Glucose-Capillary: 165 mg/dL — ABNORMAL HIGH (ref 70–99)
Glucose-Capillary: 186 mg/dL — ABNORMAL HIGH (ref 70–99)
Glucose-Capillary: 188 mg/dL — ABNORMAL HIGH (ref 70–99)

## 2020-08-07 MED ORDER — CEFAZOLIN SODIUM-DEXTROSE 2-4 GM/100ML-% IV SOLN
2.0000 g | INTRAVENOUS | Status: DC
  Filled 2020-08-07: qty 100

## 2020-08-07 MED ORDER — POTASSIUM CHLORIDE CRYS ER 20 MEQ PO TBCR
40.0000 meq | EXTENDED_RELEASE_TABLET | Freq: Once | ORAL | Status: AC
  Administered 2020-08-07: 40 meq via ORAL
  Filled 2020-08-07: qty 2

## 2020-08-07 NOTE — Progress Notes (Signed)
  Subjective:  Saw the patient today in her hospital room.  Her husband is at the bedside.  Patient states that her right hip pain is currently controlled.  Patient arrived on Plavix and therefore fixation of her left femoral neck hip fracture has been delayed.  Objective:   VITALS:   Vitals:   08/06/20 1613 08/06/20 2111 08/07/20 0025 08/07/20 0756  BP: (!) 102/40  (!) 125/54 (!) 94/57  Pulse: 69  76 95  Resp: 17  16 17   Temp: 98.6 F (37 C)  98.2 F (36.8 C) 97.8 F (36.6 C)  TempSrc: Oral  Oral Oral  SpO2: 93% 96% 96% 92%  Weight:      Height:        PHYSICAL EXAM: Left lower extremity Neurovascular intact Sensation intact distally Intact pulses distally Dorsiflexion/Plantar flexion intact No cellulitis present Compartment soft  LABS  Results for orders placed or performed during the hospital encounter of 08/05/20 (from the past 24 hour(s))  Glucose, capillary     Status: Abnormal   Collection Time: 08/06/20  4:13 PM  Result Value Ref Range   Glucose-Capillary 179 (H) 70 - 99 mg/dL  Glucose, capillary     Status: Abnormal   Collection Time: 08/06/20  8:18 PM  Result Value Ref Range   Glucose-Capillary 213 (H) 70 - 99 mg/dL  Glucose, capillary     Status: Abnormal   Collection Time: 08/07/20 12:28 AM  Result Value Ref Range   Glucose-Capillary 157 (H) 70 - 99 mg/dL   Comment 1 Notify RN   Glucose, capillary     Status: Abnormal   Collection Time: 08/07/20  4:27 AM  Result Value Ref Range   Glucose-Capillary 138 (H) 70 - 99 mg/dL   Comment 1 Notify RN   Glucose, capillary     Status: Abnormal   Collection Time: 08/07/20  7:56 AM  Result Value Ref Range   Glucose-Capillary 188 (H) 70 - 99 mg/dL  Glucose, capillary     Status: Abnormal   Collection Time: 08/07/20 12:04 PM  Result Value Ref Range   Glucose-Capillary 103 (H) 70 - 99 mg/dL    No results found.  Assessment/Plan: 2 Days Post-Op   Principal Problem:   Closed left hip fracture, initial  encounter [facility]) Active Problems:   Clinical depression   Diabetes (HCC)   Apnea, sleep   Hypothyroidism   COPD (chronic obstructive pulmonary disease) (HCC)   Chronic diastolic heart failure (HCC)   Chronic respiratory failure with hypoxia (HCC)   Preoperative clearance   Osteoporotic fracture of left hip, initial encounter [facility])  Surgery is scheduled for Saturday morning after discussion with Dr. Bertell Maria from anesthesiology.  Starting the morning is when the patient is eligible to undergo placement of a spinal anesthetic.  The anesthesiology department feels that a spinal anesthetic is much safer for the patient given her extensive pulmonary history.  Continue nebulizer treatments as ordered until surgery.  In the meantime continue Lovenox.  Patient should be on bedrest and not weight-bear on the left lower extremity.  I have explained this surgical plan to the patient and her husband.   Thornton Park , MD 08/07/2020, 1:17 PM

## 2020-08-07 NOTE — Progress Notes (Signed)
PROGRESS NOTE    Sara Pierce  GNF:[id number] DOB: [birth year] DOA: 08/05/2020 PCP: Marygrace Drought, MD    Brief Narrative:  Patient admitted to the hospital with the working diagnosis of acute left hip fracture.  79 year old female with past medical history for diastolic heart failure, sleep apnea, type 2 diabetes mellitus, hypertension, hypothyroidism, COPD, chronic hypoxic respiratory failure, depression, osteoporosis, obesity and nonambulatory state. Patient is a nursing home resident, while doing physical therapy she felt a pop in her left hip, further radiographic evaluation revealed left hip fracture. On her initial physical examination blood pressure 123/73, heart rate 63, respiratory rate 11, temperature 98.1, oxygen saturation 99%. Her lungs had decreased breath sounds bilaterally no wheezing, rales or rhonchi, heart S1-S2, present rhythmic, abdomen soft, no lower extremity edema. SARS COVID-19 negative. Left hip films with impacted subcapital left femoral neck fracture. Chest radiograph with left rotation, increased lung markings bilaterally, no infiltrates. EKG 55 bpm, normal axis, right bundle branch block, sinus with sinus arrhythmia, no ST segment or T wave changes, poor baseline  Surgery has been delayed due to COPD exacerbation. Patient continue with significant wheezing and dyspnea. Not yet back to her baseline.   Assessment & Plan:   Principal Problem:   Closed left hip fracture, initial encounter [facility]) Active Problems:   Clinical depression   Diabetes (HCC)   Apnea, sleep   Hypothyroidism   COPD (chronic obstructive pulmonary disease) (HCC)   Chronic diastolic heart failure (HCC)   Chronic respiratory failure with hypoxia (HCC)   Preoperative clearance   Osteoporotic fracture of left hip, initial encounter ([place])   1. Closed left hip fracture. Pending surgical intervention, due to COPD exacerbation and increased work of breathing.  Pain control with acetaminophen,  gabapentin, oxycodone and morphine.   Patient not stable from pulmonary perspective for surgical procedure.   2. COPD exacerbation with acute on chronic hypoxic respiratory failure. Not yet back to baseline, continue with significant dyspnea at rest and persistent wheezing.  Continue with aggressive bronchodilator therapy (scheduled duoneb and PRN albuterol).  Antitussive agents and flonase / affrin for nasal congestion.  Systemic steroids with IV methylprednisolone to 40 mg IV q12. Continue with chest pt and airway clearing techniques with flutter valve and incentive spirometer.  3. Diastolic heart failure. Continue with diuretic therapy with torsemide and spironolactone. No clinical signs of acute exacerbation.   4. Uncontrolled T2DM. Dyslipidemia. Fasting glucose is 102 mg/dl, will continue glucose cover and monitoring with insulin sliding scale.   5. Hypokalemia. Renal function with serum cr at 0,78 with K down to 3,6. Will add 3 kcl today and will follow on renal function in am.  6. HTN. On carvedilol for blood pressure control.    Patient continue to be at high risk for worsening COPD exacerbation    Status is: Inpatient  Remains inpatient appropriate because:IV treatments appropriate due to intensity of illness or inability to take PO   Dispo: The patient is from: SNF              Anticipated d/c is to: SNF              Anticipated d/c date is: 3 days              Patient currently is not medically stable to d/c.    DVT prophylaxis: scd   Code Status:   full  Family Communication:  No family at the bedside      Nutrition Status: Nutrition Problem: Increased nutrient  needs Etiology: post-op healing (planned percutaneous fixation of left femoral hip fracture) Signs/Symptoms: estimated needs Interventions: MVI, Ensure Enlive (each supplement provides 350kcal and 20 grams of protein)     Skin Documentation:   Consultants:   Cardiology    Pulmonary  orthopedic    Subjective: Patient continue to have significant dyspnea, wheezing and cough, mild improvement of her symptoms but not back to baseline.   Objective: Vitals:   08/06/20 1613 08/06/20 2111 08/07/20 0025 08/07/20 0756  BP: (!) 102/40  (!) 125/54 (!) 94/57  Pulse: 69  76 95  Resp: 17  16 17   Temp: 98.6 F (37 C)  98.2 F (36.8 C) 97.8 F (36.6 C)  TempSrc: Oral  Oral Oral  SpO2: 93% 96% 96% 92%  Weight:      Height:        Intake/Output Summary (Last 24 hours) at 08/07/2020 1143 Last data filed at 08/07/2020 0950 Gross per 24 hour  Intake 720 ml  Output 1100 ml  Net -380 ml   Filed Weights   08/05/20 0052  Weight: 108.9 kg    Examination:   General: Not in pain or dyspnea, deconditioned  Neurology: Awake and alert, non focal  E ENT: no pallor, no icterus, oral mucosa moist Cardiovascular: No JVD. S1-S2 present, rhythmic, no gallops, rubs, or murmurs. No lower extremity edema. Pulmonary: decreased breath sounds bilaterally, positive expiratory wheezing, with bilateral rhonchi and rales. Gastrointestinal. Abdomen soft and non-tender Skin. No rashes Musculoskeletal: no joint deformities     Data Reviewed: I have personally reviewed following labs and imaging studies  CBC: Recent Labs  Lab 08/05/20 0054 08/06/20 0549  WBC 12.4* 11.3*  NEUTROABS 10.5*  --   HGB 12.2 12.0  HCT 36.5 36.2  MCV 93.8 96.3  PLT 241 470   Basic Metabolic Panel: Recent Labs  Lab 08/05/20 0054 08/06/20 0549  NA 135 141  K 4.1 3.6  CL 92* 98  CO2 32 34*  GLUCOSE 137* 102*  BUN 31* 29*  CREATININE 0.83 0.78  CALCIUM 8.9 9.2   GFR: Estimated Creatinine Clearance: 72.3 mL/min (by C-G formula based on SCr of 0.78 mg/dL). Liver Function Tests: Recent Labs  Lab 08/05/20 0054  AST 17  ALT 19  ALKPHOS 87  BILITOT 1.1  PROT 7.0  ALBUMIN 4.0   No results for input(s): LIPASE, AMYLASE in the last 168 hours. No results for input(s): AMMONIA in  the last 168 hours. Coagulation Profile: No results for input(s): INR, PROTIME in the last 168 hours. Cardiac Enzymes: No results for input(s): CKTOTAL, CKMB, CKMBINDEX, TROPONINI in the last 168 hours. BNP (last 3 results) No results for input(s): PROBNP in the last 8760 hours. HbA1C: Recent Labs    08/05/20 0413  HGBA1C 5.4   CBG: Recent Labs  Lab 08/06/20 1613 08/06/20 2018 08/07/20 0028 08/07/20 0427 08/07/20 0756  GLUCAP 179* 213* 157* 138* 188*   Lipid Profile: No results for input(s): CHOL, HDL, LDLCALC, TRIG, CHOLHDL, LDLDIRECT in the last 72 hours. Thyroid Function Tests: No results for input(s): TSH, T4TOTAL, FREET4, T3FREE, THYROIDAB in the last 72 hours. Anemia Panel: No results for input(s): VITAMINB12, FOLATE, FERRITIN, TIBC, IRON, RETICCTPCT in the last 72 hours.    Radiology Studies: I have reviewed all of the imaging during this hospital visit personally     Scheduled Meds: . acetaminophen  1,000 mg Oral TID  . budesonide  0.25 mg Inhalation BID  . buPROPion  100 mg Oral Daily  .  carvedilol  3.125 mg Oral QHS  . feeding supplement (ENSURE ENLIVE)  237 mL Oral BID BM  . FLUoxetine  60 mg Oral Daily  . fluticasone  1 spray Each Nare BID  . gabapentin  200 mg Oral TID  . guaiFENesin-dextromethorphan  5 mL Oral Q6H  . insulin aspart  0-20 Units Subcutaneous Q4H  . ipratropium-albuterol  3 mL Inhalation TID  . melatonin  5 mg Oral QHS  . methylPREDNISolone (SOLU-MEDROL) injection  40 mg Intravenous Q24H  . mirtazapine  7.5 mg Oral QHS  . montelukast  10 mg Oral QHS  . multivitamin-lutein  1 capsule Oral Daily  . oxymetazoline  1 spray Each Nare BID  . potassium chloride SA  20 mEq Oral Daily  . primidone  50 mg Oral QHS  . simvastatin  20 mg Oral Daily  . spironolactone  25 mg Oral Daily  . torsemide  40 mg Oral BID   Continuous Infusions: .  ceFAZolin (ANCEF) IV       LOS: 2 days        Genavie Boettger Gerome Apley, MD

## 2020-08-07 NOTE — Progress Notes (Signed)
[facility] Cardiology  SUBJECTIVE: Patient laying in bed, denies chest pain   Vitals:   08/06/20 1613 08/06/20 2111 08/07/20 0025 08/07/20 0756  BP: (!) 102/40  (!) 125/54 (!) 94/57  Pulse: 69  76 95  Resp: 17  16 17   Temp: 98.6 F (37 C)  98.2 F (36.8 C) 97.8 F (36.6 C)  TempSrc: Oral  Oral Oral  SpO2: 93% 96% 96% 92%  Weight:      Height:         Intake/Output Summary (Last 24 hours) at 08/07/2020 0801 Last data filed at 08/07/2020 0455 Gross per 24 hour  Intake 480 ml  Output 1100 ml  Net -620 ml      PHYSICAL EXAM  General: Well developed, well nourished, in no acute distress HEENT:  Normocephalic and atramatic Neck:  No JVD.  Lungs: Clear bilaterally to auscultation and percussion. Heart: HRRR . Normal S1 and S2 without gallops or murmurs.  Abdomen: Bowel sounds are positive, abdomen soft and non-tender  Msk:  Back normal, normal gait. Normal strength and tone for age. Extremities: No clubbing, cyanosis or edema.   Neuro: Alert and oriented X 3. Psych:  Good affect, responds appropriately   LABS: Basic Metabolic Panel: Recent Labs    08/05/20 0054 08/06/20 0549  NA 135 141  K 4.1 3.6  CL 92* 98  CO2 32 34*  GLUCOSE 137* 102*  BUN 31* 29*  CREATININE 0.83 0.78  CALCIUM 8.9 9.2   Liver Function Tests: Recent Labs    08/05/20 0054  AST 17  ALT 19  ALKPHOS 87  BILITOT 1.1  PROT 7.0  ALBUMIN 4.0   No results for input(s): LIPASE, AMYLASE in the last 72 hours. CBC: Recent Labs    08/05/20 0054 08/06/20 0549  WBC 12.4* 11.3*  NEUTROABS 10.5*  --   HGB 12.2 12.0  HCT 36.5 36.2  MCV 93.8 96.3  PLT 241 224   Cardiac Enzymes: No results for input(s): CKTOTAL, CKMB, CKMBINDEX, TROPONINI in the last 72 hours. BNP: Invalid input(s): POCBNP D-Dimer: No results for input(s): DDIMER in the last 72 hours. Hemoglobin A1C: Recent Labs    08/05/20 0413  HGBA1C 5.4   Fasting Lipid Panel: No results for input(s): CHOL, HDL, LDLCALC, TRIG,  CHOLHDL, LDLDIRECT in the last 72 hours. Thyroid Function Tests: No results for input(s): TSH, T4TOTAL, T3FREE, THYROIDAB in the last 72 hours.  Invalid input(s): FREET3 Anemia Panel: No results for input(s): VITAMINB12, FOLATE, FERRITIN, TIBC, IRON, RETICCTPCT in the last 72 hours.  No results found.   Echo LVEF 55 to 65% per 2D echocardiogram 04/16/2018  TELEMETRY:   ASSESSMENT AND PLAN:  Principal Problem:   Closed left hip fracture, initial encounter ([place]) Active Problems:   Clinical depression   Diabetes ([place])   Apnea, sleep   Hypothyroidism   COPD (chronic obstructive pulmonary disease) (HCC)   Chronic diastolic heart failure (HCC)   Chronic respiratory failure with hypoxia (HCC)   Preoperative clearance   Osteoporotic fracture of left hip, initial encounter ([place])    1. Preoperative cardiovascular evaluation.The patient has no prior history of myocardial infarction, stroke or chronic kidney disease. The patient currently denies chest pain. ECG reveals sinus rhythm with right bundle branch block. BNP slightly elevated, high-sensitivity troponin normal. The patient should be at acceptable cardiovascular risk for surgery for left hip fracture.  2.Chronic diastolic congestive heart failure, patient appears euvolemic 3.COPD, on 3 L O2 by nasal cannula,with recent history of productive cough, chest  x-ray unremarkable, moderate risk for respiratory failure following extubation, evaluated and followed by Dr. Lanney Gins who recommends extubation to CPAP/BiPAP 4.Left hip fracture, awaiting surgery which is delayed while holding Plavix  Recommendations  1.Agree with current therapy 2.Continue carvedilol pre-, peri and postoperatively 3.Defer functional study 4.Continue torsemide and spironolactone 5.Hold clopidogrel (may resume as outpatient per Dr. Nehemiah Massed ) 6.  Following discharge, follow-up with Dr. Nehemiah Massed in 1 week  Sign off for now, please call if  any questions   Isaias Cowman, MD, PhD, [facility] 08/07/2020 8:01 AM

## 2020-08-08 DIAGNOSIS — J449 Chronic obstructive pulmonary disease, unspecified: Secondary | ICD-10-CM

## 2020-08-08 DIAGNOSIS — E1165 Type 2 diabetes mellitus with hyperglycemia: Secondary | ICD-10-CM

## 2020-08-08 LAB — GLUCOSE, CAPILLARY
Glucose-Capillary: 107 mg/dL — ABNORMAL HIGH (ref 70–99)
Glucose-Capillary: 122 mg/dL — ABNORMAL HIGH (ref 70–99)
Glucose-Capillary: 149 mg/dL — ABNORMAL HIGH (ref 70–99)
Glucose-Capillary: 153 mg/dL — ABNORMAL HIGH (ref 70–99)
Glucose-Capillary: 200 mg/dL — ABNORMAL HIGH (ref 70–99)
Glucose-Capillary: 207 mg/dL — ABNORMAL HIGH (ref 70–99)

## 2020-08-08 LAB — BASIC METABOLIC PANEL
Anion gap: 10 (ref 5–15)
BUN: 32 mg/dL — ABNORMAL HIGH (ref 8–23)
CO2: 35 mmol/L — ABNORMAL HIGH (ref 22–32)
Calcium: 9.4 mg/dL (ref 8.9–10.3)
Chloride: 93 mmol/L — ABNORMAL LOW (ref 98–111)
Creatinine, Ser: 0.74 mg/dL (ref 0.44–1.00)
GFR calc Af Amer: >60 mL/min (ref >60)
GFR calc non Af Amer: >60 mL/min (ref >60)
Glucose, Bld: 127 mg/dL — ABNORMAL HIGH (ref 70–99)
Potassium: 4.5 mmol/L (ref 3.5–5.1)
Sodium: 138 mmol/L (ref 135–145)

## 2020-08-08 NOTE — Progress Notes (Signed)
Ice to top of left hip.

## 2020-08-08 NOTE — Progress Notes (Addendum)
PROGRESS NOTE    Sara Pierce  VFI:[id number] DOB: November 11, 1941 DOA: 08/05/2020 PCP: Marygrace Drought, MD    Brief Narrative:  Patient admitted to the hospitalwith theworking diagnosis of acute left hip fracture.  79 year old female with past medical history for diastolic heart failure, sleep apnea, type 2 diabetes mellitus, hypertension, hypothyroidism, COPD, chronic hypoxic respiratory failure, depression, osteoporosis, obesity and nonambulatory state. Patient is a nursing home resident, while doing physical therapy she felt a pop in her left hip, further radiographic evaluation revealed left hip fracture. On her initial physical examination blood pressure 123/73, heart rate 63, respiratory rate11, temperature 98.1, oxygen saturation 99%.Her lungs had decreased breath sounds bilaterally no wheezing, rales or rhonchi, heart S1-S2, present rhythmic, abdomen soft, no lower extremity edema. SARS COVID-19 negative. Left hip films with impacted subcapital left femoral neck fracture.Chest radiograph with left rotation, increased lung markings bilaterally, no infiltrates. EKG 55 bpm, normal axis, right bundle branch block, sinus with sinus arrhythmia, no ST segment or T wave changes, poor baseline  Surgery has been delayed due to COPD exacerbation. Patient continue with significant wheezing and dyspnea. Not yet back to her baseline.   Assessment & Plan:   Principal Problem:   Closed left hip fracture, initial encounter [facility]) Active Problems:   Clinical depression   Diabetes (HCC)   Apnea, sleep   Hypothyroidism   COPD (chronic obstructive pulmonary disease) (HCC)   Chronic diastolic heart failure (HCC)   Chronic respiratory failure with hypoxia (HCC)   Preoperative clearance   Osteoporotic fracture of left hip, initial encounter ([place])    1. Closed left hip fracture. Respiratory status has improved, suspect patient has chronic significant reduction in functional physical capacity.    Plan for surgical intervention in am, with spinal anesthesia.  Continue pain control with acetaminophen, gabapentin, oxycodone and morphine.   Today patient is more stable for orthopedic procedure plan for tomorrow.   2. COPD exacerbation with acute on chronic hypoxic respiratory failure. Her dyspnea and work of breathing are better today, not yet back to baseline, but suspect patient has significant decreased pulmonary function due to chronic COPD.  On aggressive bronchodilator therapy, inhaled corticosteroids, antitussive agents and nasal decongestants.  Systemic corticosteroids with methylprednisolone 40 mg IV q 12  Continue with current dose of systemic steroids with IV methylprednisolone to 40 mg IV q12, plus airway clearing techniques with flutter valve and incentive spirometer.  Continue close oxymetry monitoring and supplemental 02 per [place].   3. Diastolic heart failure. On torsemide and spironolactone, for diuresis, no clinical signs of acute decompensation  4. Uncontrolled T2DM. Dyslipidemia. Fasting glucose is 127 mg/dl, on insulin sliding scale for glucose cover and monitoring.   Patient with difficulty eating and poor oral intake. Will consult speech for further evaluation.   Continue with statin therapy.   5. Hypokalemia. K has been corrected to 4,5 this am, renal function continue to be stable with serum cr at 0,74.  6. HTN. Continue with carvedilol for blood pressure control.   7. Depression. Continue with bupropion, fluoxetine, mirtazapine and primidone.    8. CKD stage 2. Renal function stable, continue to avoid hypotension and nephrotoxic medications.   Patient continue to be at high risk forworsening COPD exacerbation    Status is: Inpatient  Remains inpatient appropriate because:IV treatments appropriate due to intensity of illness or inability to take PO   Dispo: The patient is from: Home              Anticipated d/c  is to: SNF               Anticipated d/c date is: 3 days              Patient currently is not medically stable to d/c.   DVT prophylaxis: Enoxaparin   Code Status:   full  Family Communication:  I spoke with patient's family at the bedside, we talked in detail about patient's condition, plan of care and prognosis and all questions were addressed.      Nutrition Status: Nutrition Problem: Increased nutrient needs Etiology: post-op healing (planned percutaneous fixation of left femoral hip fracture) Signs/Symptoms: estimated needs Interventions: MVI, Ensure Enlive (each supplement provides 350kcal and 20 grams of protein)     Consultants:   Orthopedics  Cardiology   Pulmonary       Subjective: Patient continue to have dyspnea but positive improvement, not yet back to baseline, no nausea or vomiting, poor oral intake and reported difficulty eating.   Objective: Vitals:   08/07/20 0756 08/07/20 1543 08/08/20 0020 08/08/20 0754  BP: (!) 94/57 130/60 (!) 116/58 111/76  Pulse: 95 75 (!) 50 (!) 48  Resp: 17 17 18 18   Temp: 97.8 F (36.6 C) 98.8 F (37.1 C) 98 F (36.7 C) (!) 96.9 F (36.1 C)  TempSrc: Oral Oral Oral Axillary  SpO2: 92% 92% 98% 95%  Weight:      Height:        Intake/Output Summary (Last 24 hours) at 08/08/2020 1156 Last data filed at 08/08/2020 0955 Gross per 24 hour  Intake 960 ml  Output 1275 ml  Net -315 ml   Filed Weights   08/05/20 0052  Weight: 108.9 kg    Examination:   General: Not in pain or dyspnea, deconditioned  Neurology: Awake and alert, non focal  E ENT: mild pallor, no icterus, oral mucosa moist Cardiovascular: No JVD. S1-S2 present, rhythmic, no gallops, rubs, or murmurs. No lower extremity edema. Pulmonary: decreased reath sounds bilaterally, decreased air movement, improved expiratory wheezing, rhonchi and rales. Gastrointestinal. Abdomen protuberant and non tender Skin. No rashes Musculoskeletal: no joint deformities     Data Reviewed: I  have personally reviewed following labs and imaging studies  CBC: Recent Labs  Lab 08/05/20 0054 08/06/20 0549  WBC 12.4* 11.3*  NEUTROABS 10.5*  --   HGB 12.2 12.0  HCT 36.5 36.2  MCV 93.8 96.3  PLT 241 034   Basic Metabolic Panel: Recent Labs  Lab 08/05/20 0054 08/06/20 0549 08/08/20 0643  NA 135 141 138  K 4.1 3.6 4.5  CL 92* 98 93*  CO2 32 34* 35*  GLUCOSE 137* 102* 127*  BUN 31* 29* 32*  CREATININE 0.83 0.78 0.74  CALCIUM 8.9 9.2 9.4   GFR: Estimated Creatinine Clearance: 72.3 mL/min (by C-G formula based on SCr of 0.74 mg/dL). Liver Function Tests: Recent Labs  Lab 08/05/20 0054  AST 17  ALT 19  ALKPHOS 87  BILITOT 1.1  PROT 7.0  ALBUMIN 4.0   No results for input(s): LIPASE, AMYLASE in the last 168 hours. No results for input(s): AMMONIA in the last 168 hours. Coagulation Profile: No results for input(s): INR, PROTIME in the last 168 hours. Cardiac Enzymes: No results for input(s): CKTOTAL, CKMB, CKMBINDEX, TROPONINI in the last 168 hours. BNP (last 3 results) No results for input(s): PROBNP in the last 8760 hours. HbA1C: No results for input(s): HGBA1C in the last 72 hours. CBG: Recent Labs  Lab 08/07/20 2005 08/08/20 7425  08/08/20 0351 08/08/20 0752 08/08/20 1148  GLUCAP 165* 200* 149* 122* 153*   Lipid Profile: No results for input(s): CHOL, HDL, LDLCALC, TRIG, CHOLHDL, LDLDIRECT in the last 72 hours. Thyroid Function Tests: No results for input(s): TSH, T4TOTAL, FREET4, T3FREE, THYROIDAB in the last 72 hours. Anemia Panel: No results for input(s): VITAMINB12, FOLATE, FERRITIN, TIBC, IRON, RETICCTPCT in the last 72 hours.    Radiology Studies: I have reviewed all of the imaging during this hospital visit personally     Scheduled Meds: . acetaminophen  1,000 mg Oral TID  . budesonide  0.25 mg Inhalation BID  . buPROPion  100 mg Oral Daily  . carvedilol  3.125 mg Oral QHS  . feeding supplement (ENSURE ENLIVE)  237 mL Oral BID  BM  . FLUoxetine  60 mg Oral Daily  . fluticasone  1 spray Each Nare BID  . gabapentin  200 mg Oral TID  . guaiFENesin-dextromethorphan  5 mL Oral Q6H  . insulin aspart  0-20 Units Subcutaneous Q4H  . ipratropium-albuterol  3 mL Inhalation TID  . melatonin  5 mg Oral QHS  . methylPREDNISolone (SOLU-MEDROL) injection  40 mg Intravenous Q24H  . mirtazapine  7.5 mg Oral QHS  . montelukast  10 mg Oral QHS  . multivitamin-lutein  1 capsule Oral Daily  . primidone  50 mg Oral QHS  . simvastatin  20 mg Oral Daily  . spironolactone  25 mg Oral Daily  . torsemide  40 mg Oral BID   Continuous Infusions: .  ceFAZolin (ANCEF) IV       LOS: 3 days        Virga Haltiwanger Gerome Apley, MD

## 2020-08-08 NOTE — Care Management Important Message (Signed)
Important Message  Patient Details  Name: Sara Pierce MRN: [id number] Date of Birth: [birth year]   Medicare Important Message Given:  Yes     Dannette Barbara 08/08/2020, 12:27 PM

## 2020-08-09 ENCOUNTER — Inpatient Hospital Stay: Payer: Medicare Other | Admitting: Anesthesiology

## 2020-08-09 ENCOUNTER — Inpatient Hospital Stay: Payer: Medicare Other

## 2020-08-09 ENCOUNTER — Encounter
Admission: EM | Disposition: A | Discharge status: Skilled Nursing Facility | Payer: Self-pay | Source: Skilled Nursing Facility | Attending: Internal Medicine

## 2020-08-09 DIAGNOSIS — G4730 Sleep apnea, unspecified: Secondary | ICD-10-CM

## 2020-08-09 HISTORY — PX: HIP PINNING,CANNULATED: SHX1758

## 2020-08-09 LAB — CBC WITH DIFFERENTIAL/PLATELET
Abs Immature Granulocytes: 0.17 10*3/uL — ABNORMAL HIGH (ref 0.00–0.07)
Basophils Absolute: 0 10*3/uL (ref 0.0–0.1)
Basophils Relative: 0 %
Eosinophils Absolute: 0 10*3/uL (ref 0.0–0.5)
Eosinophils Relative: 0 %
HCT: 35.7 % — ABNORMAL LOW (ref 36.0–46.0)
Hemoglobin: 11.3 g/dL — ABNORMAL LOW (ref 12.0–15.0)
Immature Granulocytes: 2 %
Lymphocytes Relative: 19 %
Lymphs Abs: 1.8 10*3/uL (ref 0.7–4.0)
MCH: 30.8 pg (ref 26.0–34.0)
MCHC: 31.7 g/dL (ref 30.0–36.0)
MCV: 97.3 fL (ref 80.0–100.0)
Monocytes Absolute: 0.5 10*3/uL (ref 0.1–1.0)
Monocytes Relative: 6 %
Neutro Abs: 6.7 10*3/uL (ref 1.7–7.7)
Neutrophils Relative %: 73 %
Platelets: 211 10*3/uL (ref 150–400)
RBC: 3.67 MIL/uL — ABNORMAL LOW (ref 3.87–5.11)
RDW: 15.8 % — ABNORMAL HIGH (ref 11.5–15.5)
WBC: 9.2 10*3/uL (ref 4.0–10.5)
nRBC: 0.5 % — ABNORMAL HIGH (ref 0.0–0.2)

## 2020-08-09 LAB — BASIC METABOLIC PANEL
Anion gap: 10 (ref 5–15)
BUN: 35 mg/dL — ABNORMAL HIGH (ref 8–23)
CO2: 38 mmol/L — ABNORMAL HIGH (ref 22–32)
Calcium: 9.3 mg/dL (ref 8.9–10.3)
Chloride: 90 mmol/L — ABNORMAL LOW (ref 98–111)
Creatinine, Ser: 0.79 mg/dL (ref 0.44–1.00)
GFR calc Af Amer: >60 mL/min (ref >60)
GFR calc non Af Amer: >60 mL/min (ref >60)
Glucose, Bld: 144 mg/dL — ABNORMAL HIGH (ref 70–99)
Potassium: 4.1 mmol/L (ref 3.5–5.1)
Sodium: 138 mmol/L (ref 135–145)

## 2020-08-09 LAB — GLUCOSE, CAPILLARY
Glucose-Capillary: 149 mg/dL — ABNORMAL HIGH (ref 70–99)
Glucose-Capillary: 106 mg/dL — ABNORMAL HIGH (ref 70–99)
Glucose-Capillary: 89 mg/dL (ref 70–99)
Glucose-Capillary: 147 mg/dL — ABNORMAL HIGH (ref 70–99)
Glucose-Capillary: 146 mg/dL — ABNORMAL HIGH (ref 70–99)

## 2020-08-09 SURGERY — FIXATION, FEMUR, NECK, PERCUTANEOUS, USING SCREW
Anesthesia: General | Site: Hip | Laterality: Left

## 2020-08-09 MED ORDER — PROPOFOL 10 MG/ML IV BOLUS
INTRAVENOUS | Status: DC | PRN
  Administered 2020-08-09: 20 mg via INTRAVENOUS
  Administered 2020-08-09: 120 mg via INTRAVENOUS

## 2020-08-09 MED ORDER — EPHEDRINE SULFATE 50 MG/ML IJ SOLN
INTRAMUSCULAR | Status: DC | PRN
  Administered 2020-08-09: 10 mg via INTRAVENOUS
  Administered 2020-08-09: 5 mg via INTRAVENOUS

## 2020-08-09 MED ORDER — SUCCINYLCHOLINE CHLORIDE 20 MG/ML IJ SOLN
INTRAMUSCULAR | Status: DC | PRN
  Administered 2020-08-09: 140 mg via INTRAVENOUS

## 2020-08-09 MED ORDER — ENOXAPARIN SODIUM 40 MG/0.4ML ~~LOC~~ SOLN
40.0000 mg | SUBCUTANEOUS | Status: DC
  Administered 2020-08-10 – 2020-08-11 (×2): 40 mg via SUBCUTANEOUS
  Filled 2020-08-09 (×2): qty 0.4

## 2020-08-09 MED ORDER — EPHEDRINE 5 MG/ML INJ
INTRAVENOUS | Status: AC
  Filled 2020-08-09: qty 10

## 2020-08-09 MED ORDER — ROCURONIUM BROMIDE 100 MG/10ML IV SOLN
INTRAVENOUS | Status: DC | PRN
  Administered 2020-08-09: 30 mg via INTRAVENOUS

## 2020-08-09 MED ORDER — METHOCARBAMOL 500 MG PO TABS
500.0000 mg | ORAL_TABLET | Freq: Four times a day (QID) | ORAL | Status: DC | PRN
  Administered 2020-08-11 – 2020-08-12 (×2): 500 mg via ORAL
  Filled 2020-08-09 (×2): qty 1

## 2020-08-09 MED ORDER — MIDAZOLAM HCL 5 MG/5ML IJ SOLN
INTRAMUSCULAR | Status: DC | PRN
  Administered 2020-08-09: 2 mg via INTRAVENOUS

## 2020-08-09 MED ORDER — FENTANYL CITRATE (PF) 100 MCG/2ML IJ SOLN
INTRAMUSCULAR | Status: DC | PRN
  Administered 2020-08-09 (×2): 50 ug via INTRAVENOUS

## 2020-08-09 MED ORDER — BISACODYL 10 MG RE SUPP: 10.0000 mg | Freq: Every day | RECTAL | Status: DC | PRN

## 2020-08-09 MED ORDER — HYDROCODONE-ACETAMINOPHEN 5-325 MG PO TABS
1.0000 | ORAL_TABLET | ORAL | Status: DC | PRN
  Administered 2020-08-09 (×2): 1 via ORAL
  Filled 2020-08-09 (×2): qty 1

## 2020-08-09 MED ORDER — CEFAZOLIN SODIUM-DEXTROSE 2-3 GM-%(50ML) IV SOLR
INTRAVENOUS | Status: DC | PRN
  Administered 2020-08-09: 2 g via INTRAVENOUS

## 2020-08-09 MED ORDER — PHENYLEPHRINE HCL (PRESSORS) 10 MG/ML IV SOLN
INTRAVENOUS | Status: DC | PRN
  Administered 2020-08-09: 100 ug via INTRAVENOUS

## 2020-08-09 MED ORDER — LIDOCAINE HCL (CARDIAC) PF 100 MG/5ML IV SOSY
PREFILLED_SYRINGE | INTRAVENOUS | Status: DC | PRN
  Administered 2020-08-09: 100 mg via INTRAVENOUS

## 2020-08-09 MED ORDER — ACETAMINOPHEN 500 MG PO TABS
500.0000 mg | ORAL_TABLET | Freq: Four times a day (QID) | ORAL | Status: AC
  Administered 2020-08-09 – 2020-08-10 (×4): 500 mg via ORAL
  Filled 2020-08-09 (×4): qty 1

## 2020-08-09 MED ORDER — CEFAZOLIN SODIUM-DEXTROSE 1-4 GM/50ML-% IV SOLN
1.0000 g | Freq: Four times a day (QID) | INTRAVENOUS | Status: AC
  Administered 2020-08-09: 1 g via INTRAVENOUS
  Filled 2020-08-09 (×3): qty 50

## 2020-08-09 MED ORDER — POLYETHYLENE GLYCOL 3350 17 G PO PACK
17.0000 g | PACK | Freq: Every day | ORAL | Status: DC | PRN
  Administered 2020-08-10 – 2020-08-12 (×2): 17 g via ORAL
  Filled 2020-08-09 (×2): qty 1

## 2020-08-09 MED ORDER — ONDANSETRON HCL 4 MG/2ML IJ SOLN: 4.0000 mg | Freq: Four times a day (QID) | INTRAMUSCULAR | Status: DC | PRN

## 2020-08-09 MED ORDER — MAGNESIUM CITRATE PO SOLN
1.0000 | Freq: Once | ORAL | Status: DC | PRN
  Filled 2020-08-09: qty 296

## 2020-08-09 MED ORDER — MIDAZOLAM HCL 2 MG/2ML IJ SOLN
INTRAMUSCULAR | Status: AC
  Filled 2020-08-09: qty 2

## 2020-08-09 MED ORDER — SENNA 8.6 MG PO TABS
1.0000 | ORAL_TABLET | Freq: Two times a day (BID) | ORAL | Status: DC
  Administered 2020-08-09 – 2020-08-12 (×6): 8.6 mg via ORAL
  Filled 2020-08-09 (×6): qty 1

## 2020-08-09 MED ORDER — ACETAMINOPHEN 325 MG PO TABS: 325.0000 mg | ORAL_TABLET | Freq: Four times a day (QID) | ORAL | Status: DC | PRN

## 2020-08-09 MED ORDER — METHYLPREDNISOLONE SODIUM SUCC 40 MG IJ SOLR
20.0000 mg | INTRAMUSCULAR | Status: DC
  Administered 2020-08-09: 20 mg via INTRAVENOUS
  Filled 2020-08-09: qty 1

## 2020-08-09 MED ORDER — ONDANSETRON HCL 4 MG/2ML IJ SOLN: 4.0000 mg | Freq: Once | INTRAMUSCULAR | Status: DC | PRN

## 2020-08-09 MED ORDER — MENTHOL 3 MG MT LOZG
1.0000 | LOZENGE | OROMUCOSAL | Status: DC | PRN
  Filled 2020-08-09: qty 9

## 2020-08-09 MED ORDER — LACTATED RINGERS IV SOLN: INTRAVENOUS | Status: DC | PRN

## 2020-08-09 MED ORDER — VASOPRESSIN 20 UNIT/ML IV SOLN
INTRAVENOUS | Status: DC | PRN
  Administered 2020-08-09: 2 [IU] via INTRAVENOUS
  Administered 2020-08-09: 1 [IU] via INTRAVENOUS

## 2020-08-09 MED ORDER — DOCUSATE SODIUM 100 MG PO CAPS
100.0000 mg | ORAL_CAPSULE | Freq: Two times a day (BID) | ORAL | Status: DC
  Administered 2020-08-09 – 2020-08-12 (×6): 100 mg via ORAL
  Filled 2020-08-09 (×6): qty 1

## 2020-08-09 MED ORDER — SUGAMMADEX SODIUM 200 MG/2ML IV SOLN
INTRAVENOUS | Status: DC | PRN
  Administered 2020-08-09: 200 mg via INTRAVENOUS

## 2020-08-09 MED ORDER — ACETAMINOPHEN 10 MG/ML IV SOLN: 1000.0000 mg | Freq: Once | INTRAVENOUS | Status: DC | PRN

## 2020-08-09 MED ORDER — HYDROCODONE-ACETAMINOPHEN 7.5-325 MG PO TABS
1.0000 | ORAL_TABLET | ORAL | Status: DC | PRN
  Administered 2020-08-10 (×2): 1 via ORAL
  Filled 2020-08-09 (×2): qty 1

## 2020-08-09 MED ORDER — MORPHINE SULFATE (PF) 2 MG/ML IV SOLN
0.5000 mg | INTRAVENOUS | Status: DC | PRN
  Administered 2020-08-09: 1 mg via INTRAVENOUS
  Filled 2020-08-09: qty 1

## 2020-08-09 MED ORDER — FENTANYL CITRATE (PF) 100 MCG/2ML IJ SOLN
INTRAMUSCULAR | Status: AC
  Filled 2020-08-09: qty 2

## 2020-08-09 MED ORDER — ACETAMINOPHEN 10 MG/ML IV SOLN
INTRAVENOUS | Status: AC
  Filled 2020-08-09: qty 100

## 2020-08-09 MED ORDER — ONDANSETRON HCL 4 MG/2ML IJ SOLN
INTRAMUSCULAR | Status: DC | PRN
  Administered 2020-08-09: 4 mg via INTRAVENOUS

## 2020-08-09 MED ORDER — FENTANYL CITRATE (PF) 100 MCG/2ML IJ SOLN: 25.0000 ug | INTRAMUSCULAR | Status: DC | PRN

## 2020-08-09 MED ORDER — METHOCARBAMOL 1000 MG/10ML IJ SOLN
500.0000 mg | Freq: Four times a day (QID) | INTRAVENOUS | Status: DC | PRN
  Filled 2020-08-09: qty 5

## 2020-08-09 MED ORDER — ALUM & MAG HYDROXIDE-SIMETH 200-200-20 MG/5ML PO SUSP
30.0000 mL | ORAL | Status: DC | PRN
  Administered 2020-08-09: 30 mL via ORAL
  Filled 2020-08-09: qty 30

## 2020-08-09 MED ORDER — GLYCOPYRROLATE 0.2 MG/ML IJ SOLN
INTRAMUSCULAR | Status: AC
  Filled 2020-08-09: qty 1

## 2020-08-09 MED ORDER — PHENOL 1.4 % MT LIQD
1.0000 | OROMUCOSAL | Status: DC | PRN
  Filled 2020-08-09: qty 177

## 2020-08-09 MED ORDER — CHLORHEXIDINE GLUCONATE CLOTH 2 % EX PADS
6.0000 | MEDICATED_PAD | Freq: Every day | CUTANEOUS | Status: DC
  Administered 2020-08-11 – 2020-08-12 (×2): 6 via TOPICAL

## 2020-08-09 MED ORDER — ONDANSETRON HCL 4 MG PO TABS: 4.0000 mg | ORAL_TABLET | Freq: Four times a day (QID) | ORAL | Status: DC | PRN

## 2020-08-09 MED ORDER — SODIUM CHLORIDE 0.9 % IR SOLN
Status: DC | PRN
  Administered 2020-08-09: 1000 mL

## 2020-08-09 SURGICAL SUPPLY — 37 items
BIT DRILL CANN LRG QC 5X300 (BIT) ×2 IMPLANT
BLADE SURG SZ10 CARB STEEL (BLADE) ×3 IMPLANT
BNDG COHESIVE 6X5 TAN STRL LF (GAUZE/BANDAGES/DRESSINGS) ×6 IMPLANT
CANISTER SUCT 1200ML W/VALVE (MISCELLANEOUS) ×3 IMPLANT
COVER WAND RF STERILE (DRAPES) ×3 IMPLANT
DRAPE SURG 17X11 SM STRL (DRAPES) ×6 IMPLANT
DRAPE U-SHAPE 47X51 STRL (DRAPES) ×3 IMPLANT
DRSG OPSITE POSTOP 3X4 (GAUZE/BANDAGES/DRESSINGS) IMPLANT
DRSG OPSITE POSTOP 4X6 (GAUZE/BANDAGES/DRESSINGS) IMPLANT
DURAPREP 26ML APPLICATOR (WOUND CARE) ×6 IMPLANT
ELECT REM PT RETURN 9FT ADLT (ELECTROSURGICAL) ×3
ELECTRODE REM PT RTRN 9FT ADLT (ELECTROSURGICAL) ×1 IMPLANT
GAUZE SPONGE 4X4 12PLY STRL (GAUZE/BANDAGES/DRESSINGS) ×3 IMPLANT
GLOVE BIOGEL PI IND STRL 9 (GLOVE) ×1 IMPLANT
GLOVE BIOGEL PI INDICATOR 9 (GLOVE) ×2
GLOVE SURG 9.0 ORTHO LTXF (GLOVE) ×6 IMPLANT
GOWN STRL REUS TWL 2XL XL LVL4 (GOWN DISPOSABLE) ×3 IMPLANT
GOWN STRL REUS W/ TWL LRG LVL3 (GOWN DISPOSABLE) ×1 IMPLANT
GOWN STRL REUS W/TWL LRG LVL3 (GOWN DISPOSABLE) ×3
GUIDEWIRE THREADED 2.8 (WIRE) ×8 IMPLANT
HOLDER FOLEY CATH W/STRAP (MISCELLANEOUS) IMPLANT
MAT ABSORB  FLUID 56X50 GRAY (MISCELLANEOUS) ×3
MAT ABSORB FLUID 56X50 GRAY (MISCELLANEOUS) ×1 IMPLANT
NS IRRIG 1000ML POUR BTL (IV SOLUTION) ×3 IMPLANT
PACK HIP COMPR (MISCELLANEOUS) ×3 IMPLANT
SCREW CANN 16 THRD/65 7.3 (Screw) ×2 IMPLANT
SCREW CANN 16 THRD/70 7.3 (Screw) ×2 IMPLANT
SCREW CANN 16 THRD/75 7.3 (Screw) ×2 IMPLANT
SCREW CANN 16 THRD/80 7.3 (Screw) ×2 IMPLANT
SCREW CANN 16 THRD/85 7.3 (Screw) ×2 IMPLANT
SCREW CANN 32 THRD/90 7.3 (Screw) ×2 IMPLANT
STAPLER SKIN PROX 35W (STAPLE) ×3 IMPLANT
STRAP SAFETY 5IN WIDE (MISCELLANEOUS) ×3 IMPLANT
SUT VIC AB 0 CT1 36 (SUTURE) ×3 IMPLANT
SUT VIC AB 2-0 CT2 27 (SUTURE) ×3 IMPLANT
SUT VICRYL 0 AB UR-6 (SUTURE) ×6 IMPLANT
TRAY FOLEY MTR SLVR 16FR STAT (SET/KITS/TRAYS/PACK) IMPLANT

## 2020-08-09 NOTE — Anesthesia Procedure Notes (Signed)
Procedure Name: Intubation Performed by: Lesle Reek, CRNA Pre-anesthesia Checklist: Patient identified, Emergency Drugs available, Suction available, Patient being monitored and Timeout performed Patient Re-evaluated:Patient Re-evaluated prior to induction Oxygen Delivery Method: Circle system utilized Preoxygenation: Pre-oxygenation with 100% oxygen Induction Type: IV induction Ventilation: Mask ventilation without difficulty Laryngoscope Size: Mac and 3 Grade View: Grade I Tube type: Oral Tube size: 7.0 mm Number of attempts: 1 Airway Equipment and Method: Stylet Placement Confirmation: ETT inserted through vocal cords under direct vision and CO2 detector Secured at: 21 cm Tube secured with: Tape

## 2020-08-09 NOTE — Progress Notes (Signed)
PROGRESS NOTE    Sara Pierce  HER:[id number] DOB: 08/27/1941 DOA: 08/05/2020 PCP: Marygrace Drought, MD    Brief Narrative:  Patient admitted to the hospitalwith theworking diagnosis of acute left hip fracture in the setting of COPD exacerbation.   79 year old female with past medical history for diastolic heart failure, sleep apnea, type 2 diabetes mellitus, hypertension, hypothyroidism, COPD, chronic hypoxic respiratory failure, depression, osteoporosis, obesity and nonambulatory state. Patient is a nursing home resident, while doing physical therapy she felt a pop in her left hip, further radiographic evaluation revealed left hip fracture. On her initial physical examination blood pressure 123/73, heart rate 63, respiratory rate11, temperature 98.1, oxygen saturation 99%.Her lungs had decreased breath sounds bilaterally no wheezing, rales or rhonchi, heart S1-S2, present rhythmic, abdomen soft, no lower extremity edema. SARS COVID-19 negative. Left hip films with impacted subcapital left femoral neck fracture.Chest radiograph with left rotation, increased lung markings bilaterally, no infiltrates. EKG 55 bpm, normal axis, right bundle branch block, sinus with sinus arrhythmia, no ST segment or T wave changes, poor baseline  Surgery has been delayed due to COPD exacerbation. Patient continue with significant wheezing and dyspnea. Not yet back to her baseline.  Suspect patient has significant decreased pulmonary function due to chronic COPD.  Patient underwent cannulated screw fixation for the left femoral neck to the left hip fracture with no major complications.    Assessment & Plan:   Principal Problem:   Closed left hip fracture, initial encounter [facility]) Active Problems:   Clinical depression   Diabetes (HCC)   Apnea, sleep   Hypothyroidism   COPD (chronic obstructive pulmonary disease) (HCC)   Chronic diastolic heart failure (HCC)   Chronic respiratory failure with hypoxia  (HCC)   Preoperative clearance   Osteoporotic fracture of left hip, initial encounter ([place])   1. Closed left hip fracture.sp left femoral neck screw fixation. Patient tolerated procedure well. Her oxygenation is 99 to 100 on 3 L/min per [place].   Pain control with acetaminophen, gabapentin, oxycodone and morphine.  Follow with PT/OT and orthopedics recommendations.   2. COPD exacerbation with acute on chronic hypoxic respiratory failure.Continue to improve dyspnea, her symptoms are close to baseline.   Continue with aggressivebronchodilator therapy, inhaled corticosteroids, antitussive agents and nasal decongestants.  Decrease methylprednisolone to 20 mg IV q 24 H.   Continue with airway clearing techniques with flutter valve and incentive spirometer.  Oxymetry monitoring and supplemental 02 per [place] to keep 02 saturation more than 88%. Pending swallow evaluation.   3. Diastolic heart failure.Continue with torsemide and spironolactone. No clinical signs of decompensation, stable renal function and electrolytes.   4. Uncontrolled T2DM. Dyslipidemia.Continue with insulin sliding scale for glucose cover and monitoring.    Fasting glucose this am is 144 mg/dl.   5. CKD stage 2 with Hypokalemia.resolved hypokalemia, serum K today is 4,1 with serum cr at 0,79 and bicarbonate at 38.   6. HTN.On carvedilol for blood pressure control with good toleration, keep MAP more than 65. She had episodic systolic blood pressure in the high 90's.   7. Depression. On bupropion, fluoxetine, mirtazapine and primidone.     Patient continue to be at high risk forworsening COPD exacerbation    Status is: Inpatient  Remains inpatient appropriate because:IV treatments appropriate due to intensity of illness or inability to take PO   Dispo: The patient is from: SNF              Anticipated d/c is to: SNF  Anticipated d/c date is: 2 days              Patient currently is  not medically stable to d/c.   DVT prophylaxis: Enoxaparin   Code Status:   full  Family Communication:  I spoke with patient's family at the bedside, we talked in detail about patient's condition, plan of care and prognosis and all questions were addressed.      Nutrition Status: Nutrition Problem: Increased nutrient needs Etiology: post-op healing (planned percutaneous fixation of left femoral hip fracture) Signs/Symptoms: estimated needs Interventions: MVI, Ensure Enlive (each supplement provides 350kcal and 20 grams of protein)      Consultants:   orthopedics   Procedures:  cannulated screw fixation for the left femoral neck to the left hip  Subjective: Patient continue to have dyspnea but improves, continue to have difficulty swallowing, no nausea or vomiting. Positive post operative pain.   Objective: Vitals:   08/09/20 1142 08/09/20 1149 08/09/20 1221 08/09/20 1257  BP: (!) 96/41 102/62 (!) 95/50 (!) 105/58  Pulse: (!) 46  95 82  Resp: 16 12 15 17   Temp:   (!) 97.5 F (36.4 C)   TempSrc:   Oral   SpO2: 96% 96% 100% 99%  Weight:      Height:        Intake/Output Summary (Last 24 hours) at 08/09/2020 1324 Last data filed at 08/09/2020 1053 Gross per 24 hour  Intake 1300 ml  Output 3200 ml  Net -1900 ml   Filed Weights   08/05/20 0052  Weight: 108.9 kg    Examination:   General: Not in pain or dyspnea, deconditioned  Neurology: Awake and alert, non focal  E ENT: mild pallor, no icterus, oral mucosa moist Cardiovascular: No JVD. S1-S2 present, rhythmic, no gallops, rubs, or murmurs. No lower extremity edema. Pulmonary: positive breath sounds bilaterally, decreased air movement, expiratory  wheezing, with scattered rhonchi and rales. Gastrointestinal. Abdomen soft and non tender Skin. No rashes Musculoskeletal: no joint deformities     Data Reviewed: I have personally reviewed following labs and imaging studies  CBC: Recent Labs  Lab  08/05/20 0054 08/06/20 0549 08/09/20 0504  WBC 12.4* 11.3* 9.2  NEUTROABS 10.5*  --  6.7  HGB 12.2 12.0 11.3*  HCT 36.5 36.2 35.7*  MCV 93.8 96.3 97.3  PLT 241 224 409   Basic Metabolic Panel: Recent Labs  Lab 08/05/20 0054 08/06/20 0549 08/08/20 0643 08/09/20 0504  NA 135 141 138 138  K 4.1 3.6 4.5 4.1  CL 92* 98 93* 90*  CO2 32 34* 35* 38*  GLUCOSE 137* 102* 127* 144*  BUN 31* 29* 32* 35*  CREATININE 0.83 0.78 0.74 0.79  CALCIUM 8.9 9.2 9.4 9.3   GFR: Estimated Creatinine Clearance: 72.3 mL/min (by C-G formula based on SCr of 0.79 mg/dL). Liver Function Tests: Recent Labs  Lab 08/05/20 0054  AST 17  ALT 19  ALKPHOS 87  BILITOT 1.1  PROT 7.0  ALBUMIN 4.0   No results for input(s): LIPASE, AMYLASE in the last 168 hours. No results for input(s): AMMONIA in the last 168 hours. Coagulation Profile: No results for input(s): INR, PROTIME in the last 168 hours. Cardiac Enzymes: No results for input(s): CKTOTAL, CKMB, CKMBINDEX, TROPONINI in the last 168 hours. BNP (last 3 results) No results for input(s): PROBNP in the last 8760 hours. HbA1C: No results for input(s): HGBA1C in the last 72 hours. CBG: Recent Labs  Lab 08/08/20 1606 08/08/20 2026 08/09/20 0417  08/09/20 1244 08/09/20 1304  GLUCAP 107* 207* 149* 106* 89   Lipid Profile: No results for input(s): CHOL, HDL, LDLCALC, TRIG, CHOLHDL, LDLDIRECT in the last 72 hours. Thyroid Function Tests: No results for input(s): TSH, T4TOTAL, FREET4, T3FREE, THYROIDAB in the last 72 hours. Anemia Panel: No results for input(s): VITAMINB12, FOLATE, FERRITIN, TIBC, IRON, RETICCTPCT in the last 72 hours.    Radiology Studies: I have reviewed all of the imaging during this hospital visit personally     Scheduled Meds: . acetaminophen  500 mg Oral Q6H  . budesonide  0.25 mg Inhalation BID  . buPROPion  100 mg Oral Daily  . carvedilol  3.125 mg Oral QHS  . Chlorhexidine Gluconate Cloth  6 each Topical Daily   . docusate sodium  100 mg Oral BID  . [START ON 08/10/2020] enoxaparin (LOVENOX) injection  40 mg Subcutaneous Q24H  . feeding supplement (ENSURE ENLIVE)  237 mL Oral BID BM  . FLUoxetine  60 mg Oral Daily  . fluticasone  1 spray Each Nare BID  . gabapentin  200 mg Oral TID  . guaiFENesin-dextromethorphan  5 mL Oral Q6H  . insulin aspart  0-20 Units Subcutaneous Q4H  . ipratropium-albuterol  3 mL Inhalation TID  . melatonin  5 mg Oral QHS  . methylPREDNISolone (SOLU-MEDROL) injection  40 mg Intravenous Q24H  . mirtazapine  7.5 mg Oral QHS  . montelukast  10 mg Oral QHS  . multivitamin-lutein  1 capsule Oral Daily  . primidone  50 mg Oral QHS  . senna  1 tablet Oral BID  . simvastatin  20 mg Oral Daily  . spironolactone  25 mg Oral Daily  . torsemide  40 mg Oral BID   Continuous Infusions: .  ceFAZolin (ANCEF) IV    .  ceFAZolin (ANCEF) IV    . methocarbamol (ROBAXIN) IV       LOS: 4 days        Lemont Sitzmann Gerome Apley, MD

## 2020-08-09 NOTE — Transfer of Care (Signed)
Immediate Anesthesia Transfer of Care Note  Patient: Sara Pierce  Procedure(s) Performed: CANNULATED HIP PINNING (Left Hip)  Patient Location: PACU  Anesthesia Type:General  Level of Consciousness: awake, alert  and oriented  Airway & Oxygen Therapy: Patient Spontanous Breathing and Patient connected to face mask oxygen  Post-op Assessment: Report given to RN  Post vital signs: Reviewed and stable  Last Vitals:  Vitals Value Taken Time  BP 121/44 08/09/20 1051  Temp 36.4 C 08/09/20 1051  Pulse 73 08/09/20 1051  Resp 14 08/09/20 1053  SpO2 91 % 08/09/20 1051  Vitals shown include unvalidated device data.  Last Pain:  Vitals:   08/09/20 0737  TempSrc:   PainSc: 9          Complications: No complications documented.

## 2020-08-09 NOTE — Progress Notes (Signed)
SLP Cancellation Note  Patient Details Name: Sara Pierce MRN: [id number] DOB: May 24, 1941   Cancelled treatment:       Reason Eval/Treat Not Completed: Patient at procedure or test/unavailable (chart reviewed; consulted NSG re: pt's status). Discussed pt's status w/ NSG. Pt has been tolerating Pills w/ water per report; po's at meals w/out report of swallowing problems. Per chart review, noted pt had a visit to the ED in 05/2020 for respiratory distress after eating a "cheeseburger" and felt it lodged in her throat; pt was discharged home post assessment/care.  Pt has Baseline chronic diastolic CHF and COPD, sedentary per report. Pt is followed by Palliative Care at [facility] for symptom management and GOC. Discussed w/ NSG the recommendation for a Mech Soft consistency diet (cut meats, moist foods) post hip surgery today w/ general aspiration precautions including sitting upright w/ all oral intake. ST services will f/u w/ toleration of diet. Pills in a Puree if deemed needed by NSG, pt.      Orinda Kenner, MS, CCC-SLP Gavrielle Streck 08/09/2020, 8:31 AM

## 2020-08-09 NOTE — Anesthesia Preprocedure Evaluation (Signed)
Anesthesia Evaluation  Patient identified by MRN, date of birth, ID band Patient awake  General Assessment Comment:Ill-appearing, on nasal cannula, occasional coughing  Reviewed: Allergy & Precautions, H&P , NPO status , Patient's Chart, lab work & pertinent test results, reviewed documented beta blocker date and time   History of Anesthesia Complications Negative for: history of anesthetic complications  Airway Mallampati: II  TM Distance: >3 FB Neck ROM: limited    Dental  (+) Chipped, Partial Lower, Partial Upper, Missing, Dental Advisory Given   Pulmonary shortness of breath and at rest, sleep apnea and Continuous Positive Airway Pressure Ventilation , pneumonia, resolved, COPD,  COPD inhaler and oxygen dependent, former smoker,  On 3L [place], O2sats 96% in preop Patient says she is almost back to respiratory baseline, much better than admission. Still has a cough she is too weak to cough up   + rhonchi  + decreased breath sounds      Cardiovascular Exercise Tolerance: Poor hypertension, Pt. on home beta blockers and Pt. on medications + Past MI and +CHF  (-) pacemaker Rhythm:Regular Rate:Normal  TTE 2019: - Procedure narrative: Transthoracic echocardiography. Image  quality was poor. The study was technically difficult, as a  result of poor acoustic windows and poor patient compliance.  - Left ventricle: Systolic function was normal. The estimated  ejection fraction was in the range of 55% to 65%.  - Aortic valve: There was trivial regurgitation.  - Mitral valve: There was mild regurgitation.  - Pericardium, extracardiac: A trivial pericardial effusion was  identified. Features were not consistent with tamponade  physiology.    Neuro/Psych PSYCHIATRIC DISORDERS Anxiety Depression  Neuromuscular disease    GI/Hepatic neg GERD  ,  Endo/Other  diabetesHypothyroidism   Renal/GU Renal InsufficiencyRenal disease      Musculoskeletal  (+) Arthritis ,   Abdominal   Peds  Hematology  (+) anemia ,   Anesthesia Other Findings Past Medical History:   COPD (chronic obstructive pulmonary disease) (*              Clotting disorder (HCC)                                      Chronic kidney disease                                       Depression                                                   Cardiomyopathy ([place])                                         Arthritis                                                    Cataract  Cerebral hemorrhage (HCC)                                    Diabetes mellitus without complication ([place])                 Hypertension                                                 Obesity                                                      Sleep apnea                                                  Urinary frequency                                            Vaginal atrophy                                              Varicose veins                                               Mixed incontinence                                           Thyroid disease                                             Past Surgical History:   APPENDECTOMY                                                  Cardiac Catherization                                         PACEMAKER INSERTION                                           JOINT REPLACEMENT  BILATERAL OOPHORECTOMY                                        SPINE SURGERY                                                 STRABISMUS SURGERY                                            TONSILLECTOMY                                                 TUBAL LIGATION                                               BMI    Body Mass Index   39.60 kg/m 2      Reproductive/Obstetrics                             Anesthesia Physical  Anesthesia  Plan  ASA: IV  Anesthesia Plan: Spinal   Post-op Pain Management:    Induction: Intravenous  PONV Risk Score and Plan: 2 and Ondansetron, Dexamethasone, Propofol infusion, TIVA and Treatment may vary due to age or medical condition  Airway Management Planned: Natural Airway and Oral ETT  Additional Equipment: None  Intra-op Plan:   Post-operative Plan: Possible Post-op intubation/ventilation  Informed Consent: I have reviewed the patients History and Physical, chart, labs and discussed the procedure including the risks, benefits and alternatives for the proposed anesthesia with the patient or authorized representative who has indicated his/her understanding and acceptance.     Dental advisory given  Plan Discussed with: CRNA  Anesthesia Plan Comments: (Discussed R/B/A of neuraxial anesthesia technique with patient: - rare risks of spinal/epidural hematoma, nerve damage, infection. Plavix has been off for 5 days, counseled patient and husband on ASRA guidelines of 5-7 days of plavix cessation prior to spinal. Discussed with Dr Mack Guise, risk of waiting 2 more days is likely greater (pulmonary complications) than the small risk of spinal hematoma. Patient understands. - Risk of PDPH - Risk of nausea and vomiting - Risk of conversion to general anesthesia and its associated risks, including sore throat, damage to lips/teeth/oropharynx, and rare risks such as cardiac and respiratory events. Informed about possible prolonged intubation and ventilation. Patient says she is OK with ventilation for short period post op if necessary, but would not want to be on a ventilator long term though .  Patient counseled on being higher risk for anesthesia due to comorbidities: chronic respiratory failure on home oxygen. Patient was told about increased risk of cardiac and respiratory events, including death. Patient understands.  )        Anesthesia Quick Evaluation

## 2020-08-09 NOTE — TOC Progression Note (Signed)
Transition of Care [facility]) - Progression Note    Patient Details  Name: Sara Pierce MRN: [id number] Date of Birth: [birth year]  Transition of Care [facility]) CM/SW Contact  Zigmund Daniel Dorian Pod, RN Phone Number: 08/09/2020, 1:52 PM  Clinical Narrative:     Pt remains in PACU post surgery, stable with vitals, pain controlled awake and alert. Once transitioned to the floor TOC awaiting PT evaluation for pt's disposition.  TOP team remains available and will assist accordingly.    Barriers to Discharge: Continued Medical Work up, Other (comment) (Surgery Pending)  Expected Discharge Plan and Services                                                 Social Determinants of Health (SDOH) Interventions    Readmission Risk Interventions Readmission Risk Prevention Plan 01/17/2020  Transportation Screening Complete  PCP or Specialist Appt within 3-5 Days Not Complete  Not Complete comments SNF resident  Social Work Consult for [place] Planning/Counseling Complete  Palliative Care Screening Complete  Medication Review (RN Care Manager) Referral to Pharmacy  Some recent data might be hidden

## 2020-08-09 NOTE — Progress Notes (Signed)
15 minute call to floor. 

## 2020-08-09 NOTE — Op Note (Signed)
08/09/2020  10:58 AM  PATIENT:  Sara Pierce    PRE-OPERATIVE DIAGNOSIS:  Left impacted femoral neck hip Fracture  POST-OPERATIVE DIAGNOSIS:  Same  PROCEDURE:  CANNULATED SCREW FIXATION FOR LEFT FEMORAL NECK HIP FRACTURE  SURGEON:  Thornton Park, MD  ANESTHESIA:   General  PREOPERATIVE INDICATIONS:  Sara Pierce is a  79 y.o. female who fell and was found to have a diagnosis of Left Hip Fracture .  Percutaneous fixation has been recommended for fixation of this non-displaced fracture.    Given the patient is on Plavix at baseline and has advanced COPD, the decision was made to wait until the patient was eligible for a spinal anesthetic to proceed with surgery.  Patient waited 5 days off of Plavix.  The risks benefits and alternatives were discussed with the patient preoperatively including but not limited to infection, bleeding, nerve or blood vessel injury, persistent hip pain,  malunion, nonunion, avascular necrosis, change in lower extremity rotation, leg length discrepancy, failure of the hardware and the need for revision surgery, including the potential for conversion to hemi or total hip arthroplasty. Medical risks include but are not limited to: DVT and pulmonary embolism, myocardial infarction, stroke, pneumonia, respiratory failure and death.  The patient understood and agreed with the plan for surgery.  Patient had the left hip marked with the word yes and my initials according the hospitals correct site of surgery protocol.  OPERATIVE IMPLANTS: 7.3 mm cannulated screws x 4  OPERATIVE FINDINGS: Impacted left femoral neck hip fracture.  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  OPERATIVE PROCEDURE: The patient was brought to the operating room.  The anesthesia service attempted a spinal anesthetic unsuccessfully.  Therefore general anesthesia was administered by the anesthesia service.  The patient was then placed  supine on the fracture table. IV Ancef 2 g was administered. The left lower extremity was positioned in a leg holder, without any significant reduction maneuver other than mild internal rotation.  The well leg was placed in hemi-lithotomy position. The hip was prepped and draped in usual sterile fashion.  A time out was performed to verify the patient's name, date of birth, medical record number, correct site of surgery and correct surger to be performed. The  timeout was also used to verify the patient had received antibiotics that all appropriate instruments, implants and radiographic studies were available in the room. Once all in attendance were in agreement case began..  Once the reduction was deemed near-anatomic, a small lateral incision was made in line with the femur, distal to the greater trochanter, and 4 threaded guidewires were introduced Into the lateral cortex of the femur, across the fracture site and into the femoral head in a diamond configuration.  Washers were used over the screws due to the patient's significant disuse osteopenia in the left hip.  The lengths of these guidepins were measured with a depth gauge. The lateral cortex was then opened with a cannulated drill, and then the cannulated screws were advanced into position and tightened by hand. Solid fixation was achieved.  The guide pins were then removed and final C-arm images were taken of the fracture fixation. The fracture was well reduced and the hardware in good position.  The wound was irrigated copiously, and the deep and subcutaneous tissues were repaired with 0 and 2-0 Vicryl suture respectively and the skin was approximated with staples.  I was scrubbed and present the entire case  and all sharp and instrument counts were correct at the conclusion of the case.  I spoke with the patient's husband in the patient's room to let him know the case was completed without complication and the patient was stable in the recovery  room.  Patient was extubated and stable in the recovery room despite her COPD.    [name], MD

## 2020-08-09 NOTE — Progress Notes (Signed)
Subjective:  Patient is in the preoperative area today.  Patient is now been off Plavix for 5 days is eligible for spinal anesthetic.  Given her pulmonary condition this was deemed the best for anesthesia for her.  Objective:   VITALS:   Vitals:   08/08/20 1559 08/08/20 2008 08/08/20 2346 08/09/20 0733  BP: (!) 111/42  121/62   Pulse: (!) 59  (!) 59   Resp: 20  18   Temp: 97.8 F (36.6 C)  98.2 F (36.8 C)   TempSrc: Oral  Oral   SpO2: 98% 98% 91% 98%  Weight:      Height:        PHYSICAL EXAM: Left lower extremity Neurovascular intact Sensation intact distally Intact pulses distally Dorsiflexion/Plantar flexion intact No cellulitis present Compartment soft  LABS  Results for orders placed or performed during the hospital encounter of 08/05/20 (from the past 24 hour(s))  Glucose, capillary     Status: Abnormal   Collection Time: 08/08/20 11:48 AM  Result Value Ref Range   Glucose-Capillary 153 (H) 70 - 99 mg/dL  Glucose, capillary     Status: Abnormal   Collection Time: 08/08/20  4:06 PM  Result Value Ref Range   Glucose-Capillary 107 (H) 70 - 99 mg/dL  Glucose, capillary     Status: Abnormal   Collection Time: 08/08/20  8:26 PM  Result Value Ref Range   Glucose-Capillary 207 (H) 70 - 99 mg/dL   Comment 1 Notify RN   Glucose, capillary     Status: Abnormal   Collection Time: 08/09/20  4:17 AM  Result Value Ref Range   Glucose-Capillary 149 (H) 70 - 99 mg/dL   Comment 1 Notify RN   Basic metabolic panel     Status: Abnormal   Collection Time: 08/09/20  5:04 AM  Result Value Ref Range   Sodium 138 135 - 145 mmol/L   Potassium 4.1 3.5 - 5.1 mmol/L   Chloride 90 (L) 98 - 111 mmol/L   CO2 38 (H) 22 - 32 mmol/L   Glucose, Bld 144 (H) 70 - 99 mg/dL   BUN 35 (H) 8 - 23 mg/dL   Creatinine, Ser 0.79 0.44 - 1.00 mg/dL   Calcium 9.3 8.9 - 10.3 mg/dL   GFR calc non Af Amer >60 >60 mL/min   GFR calc Af Amer >60 >60 mL/min   Anion gap 10 5 - 15  CBC with  Differential/Platelet     Status: Abnormal   Collection Time: 08/09/20  5:04 AM  Result Value Ref Range   WBC 9.2 4.0 - 10.5 K/uL   RBC 3.67 (L) 3.87 - 5.11 MIL/uL   Hemoglobin 11.3 (L) 12.0 - 15.0 g/dL   HCT 35.7 (L) 36 - 46 %   MCV 97.3 80.0 - 100.0 fL   MCH 30.8 26.0 - 34.0 pg   MCHC 31.7 30.0 - 36.0 g/dL   RDW 15.8 (H) 11.5 - 15.5 %   Platelets 211 150 - 400 K/uL   nRBC 0.5 (H) 0.0 - 0.2 %   Neutrophils Relative % 73 %   Neutro Abs 6.7 1.7 - 7.7 K/uL   Lymphocytes Relative 19 %   Lymphs Abs 1.8 0.7 - 4.0 K/uL   Monocytes Relative 6 %   Monocytes Absolute 0.5 0 - 1 K/uL   Eosinophils Relative 0 %   Eosinophils Absolute 0.0 0 - 0 K/uL   Basophils Relative 0 %   Basophils Absolute 0.0 0 - 0 K/uL  Immature Granulocytes 2 %   Abs Immature Granulocytes 0.17 (H) 0.00 - 0.07 K/uL    No results found.  Assessment/Plan: Day of Surgery   Principal Problem:   Closed left hip fracture, initial encounter [facility]) Active Problems:   Clinical depression   Diabetes (HCC)   Apnea, sleep   Hypothyroidism   COPD (chronic obstructive pulmonary disease) (HCC)   Chronic diastolic heart failure (HCC)   Chronic respiratory failure with hypoxia (HCC)   Preoperative clearance   Osteoporotic fracture of left hip, initial encounter [facility])  Patient had a left hip marked according the hospital's qu[place]et of surgery protocol.  Answered all questions regarding the patient surgery.  I reviewed in detail with the patient and her husband the details of the operation as well as the postoperative course and the risks and benefits of surgery.    Thornton Park , MD 08/09/2020, 8:21 AM

## 2020-08-09 NOTE — Progress Notes (Signed)
   08/09/20 1723  Assess: if the MEWS score is Yellow or Red  Were vital signs taken at a resting state? Yes  Focused Assessment No change from prior assessment  Early Detection of Sepsis Score *See Row Information* Medium  MEWS guidelines implemented *See Row Information* Yes  Treat  MEWS Interventions Administered prn meds/treatments  Pain Scale 0-10  Pain Score 10  Pain Type Surgical pain  Pain Location Leg  Pain Orientation Left  Pain Descriptors / Indicators Aching;Burning;Throbbing  Pain Frequency Constant  Pain Onset On-going  Patients Stated Pain Goal 0  Pain Intervention(s) Medication (See eMAR)  Take Vital Signs  Increase Vital Sign Frequency  Yellow: Q 2hr X 2 then Q 4hr X 2, if remains yellow, continue Q 4hrs  Escalate  MEWS: Escalate Yellow: discuss with charge nurse/RN and consider discussing with provider and RRT  Notify: Charge Nurse/RN  Name of Charge Nurse/RN Notified Helen Hashimoto RN  Date Charge Nurse/RN Notified 08/09/20  Time Charge Nurse/RN Notified 1725  Notify: Provider  Provider Name/Title Arrien  Date Provider Notified 08/09/20  Time Provider Notified 1754  Notification Type Page  Notification Reason Change in status  Response No new orders  Date of Provider Response 08/09/20  Time of Provider Response 1804  Document  Patient Outcome Other (Comment) (monitor response to pain medication)  Progress note created (see row info) Yes

## 2020-08-09 NOTE — Anesthesia Postprocedure Evaluation (Signed)
Anesthesia Post Note  Patient: Sara Pierce  Procedure(s) Performed: CANNULATED HIP PINNING (Left Hip)  Patient location during evaluation: PACU Anesthesia Type: General Level of consciousness: awake and alert Pain management: pain level controlled Vital Signs Assessment: post-procedure vital signs reviewed and stable Respiratory status: spontaneous breathing, nonlabored ventilation, respiratory function stable and patient connected to nasal cannula oxygen Cardiovascular status: blood pressure returned to baseline and stable Postop Assessment: no apparent nausea or vomiting Anesthetic complications: no   No complications documented.   Last Vitals:  Vitals:   08/09/20 1142 08/09/20 1149  BP: (!) 96/41 102/62  Pulse: (!) 46   Resp: 16 12  Temp:    SpO2: 96% 96%    Last Pain:  Vitals:   08/09/20 1149  TempSrc:   PainSc: 3                  Arita Miss

## 2020-08-10 LAB — BASIC METABOLIC PANEL
Anion gap: 13 (ref 5–15)
BUN: 26 mg/dL — ABNORMAL HIGH (ref 8–23)
CO2: 35 mmol/L — ABNORMAL HIGH (ref 22–32)
Calcium: 8.6 mg/dL — ABNORMAL LOW (ref 8.9–10.3)
Chloride: 88 mmol/L — ABNORMAL LOW (ref 98–111)
Creatinine, Ser: 0.77 mg/dL (ref 0.44–1.00)
GFR calc Af Amer: >60 mL/min (ref >60)
GFR calc non Af Amer: >60 mL/min (ref >60)
Glucose, Bld: 87 mg/dL (ref 70–99)
Potassium: 3.8 mmol/L (ref 3.5–5.1)
Sodium: 136 mmol/L (ref 135–145)

## 2020-08-10 LAB — GLUCOSE, CAPILLARY
Glucose-Capillary: 108 mg/dL — ABNORMAL HIGH (ref 70–99)
Glucose-Capillary: 129 mg/dL — ABNORMAL HIGH (ref 70–99)
Glucose-Capillary: 130 mg/dL — ABNORMAL HIGH (ref 70–99)
Glucose-Capillary: 131 mg/dL — ABNORMAL HIGH (ref 70–99)
Glucose-Capillary: 132 mg/dL — ABNORMAL HIGH (ref 70–99)
Glucose-Capillary: 83 mg/dL (ref 70–99)
Glucose-Capillary: 89 mg/dL (ref 70–99)

## 2020-08-10 LAB — CBC
HCT: 31.3 % — ABNORMAL LOW (ref 36.0–46.0)
Hemoglobin: 9.8 g/dL — ABNORMAL LOW (ref 12.0–15.0)
MCH: 30.9 pg (ref 26.0–34.0)
MCHC: 31.3 g/dL (ref 30.0–36.0)
MCV: 98.7 fL (ref 80.0–100.0)
Platelets: 184 10*3/uL (ref 150–400)
RBC: 3.17 MIL/uL — ABNORMAL LOW (ref 3.87–5.11)
RDW: 15.8 % — ABNORMAL HIGH (ref 11.5–15.5)
WBC: 10.3 10*3/uL (ref 4.0–10.5)
nRBC: 0.3 % — ABNORMAL HIGH (ref 0.0–0.2)

## 2020-08-10 MED ORDER — CYCLOBENZAPRINE HCL 10 MG PO TABS
5.0000 mg | ORAL_TABLET | Freq: Three times a day (TID) | ORAL | Status: DC
  Administered 2020-08-10 – 2020-08-12 (×6): 5 mg via ORAL
  Filled 2020-08-10 (×6): qty 1

## 2020-08-10 MED ORDER — HYDROCODONE-ACETAMINOPHEN 7.5-325 MG PO TABS
1.0000 | ORAL_TABLET | ORAL | Status: DC | PRN
  Administered 2020-08-10 – 2020-08-12 (×4): 1 via ORAL
  Filled 2020-08-10 (×5): qty 1

## 2020-08-10 NOTE — Progress Notes (Signed)
PROGRESS NOTE    Sara Pierce  STM:[id number] DOB: 1941-02-07 DOA: 08/05/2020 PCP: Marygrace Drought, MD    Brief Narrative:  Patient admitted to the hospitalwith theworking diagnosis of acute left hip fracture in the setting of COPD exacerbation.   79 year old female with past medical history for diastolic heart failure, sleep apnea, type 2 diabetes mellitus, hypertension, hypothyroidism, COPD, chronic hypoxic respiratory failure, depression, osteoporosis, obesity and nonambulatory state. Patient is a nursing home resident, while doing physical therapy she felt a pop in her left hip, further radiographic evaluation revealed left hip fracture. On her initial physical examination blood pressure 123/73, heart rate 63, respiratory rate11, temperature 98.1, oxygen saturation 99%.Her lungs had decreased breath sounds bilaterally no wheezing, rales or rhonchi, heart S1-S2, present rhythmic, abdomen soft, no lower extremity edema. SARS COVID-19 negative. Left hip films with impacted subcapital left femoral neck fracture.Chest radiograph with left rotation, increased lung markings bilaterally, no infiltrates. EKG 55 bpm, normal axis, right bundle branch block, sinus with sinus arrhythmia, no ST segment or T wave changes, poor baseline  Surgery has been delayed due to COPD exacerbation. Patient continue with significant wheezing and dyspnea. Not yet back to her baseline.  Suspect patient has significant decreased pulmonary function due to chronic COPD.  Patient underwent cannulated screw fixation for the left femoral neck to the left hip fracture with no major complications.    Assessment & Plan:   Principal Problem:   Closed left hip fracture, initial encounter [facility]) Active Problems:   Clinical depression   Diabetes (HCC)   Apnea, sleep   Hypothyroidism   COPD (chronic obstructive pulmonary disease) (HCC)   Chronic diastolic heart failure (HCC)   Chronic respiratory failure with  hypoxia (HCC)   Preoperative clearance   Osteoporotic fracture of left hip, initial encounter ([place])    1. Closed left hip fracture.sp left femoral neck screw fixation.  Patient has pain at the left thigh. She has been not ambulatory since 2018 after a fall with left elbow and spine injury.   Will continue pain control with scheduled po acetaminophen and gabapentin. Will add tid cyclobenzaprine for muscle relaxation and continue with as needed hydrocodone. Will discontinue IV Morphine for now.      Continue to follow with PT/OT and orthopedics recommendations. Plan to return to SNF in 24 to 48 H.  Continue bowel regimen.   2. COPD exacerbation with acute on chronic hypoxic respiratory failure.dyspnea is at baseline, but today with increased congestion and cough. Oxygenation is 94% on 3 L/min.   On aggressivebronchodilator therapy, inhaled corticosteroids, antitussive agentsand nasal decongestants. chest PT, and airway clearing techniques with flutter valve and incentive spirometer.   Will change methylprednisolone to oral prednisone with slow taper.   Diet with mechanical soft with aspiration precautions per speech therapy.   3. Diastolic heart failure.On torsemide and spironolactone. No acute clinical signs of decompensation.   4. Controlled T2DM Hgb A1c is 5,4. Dyslipidemia. Fasting glucose this am is 87 mg/dl. Continue insulin sliding scalae for glucose cover and monitoring.  Continue with statin therapy.   5. CKD stage 2 with Hypokalemia.Stable renal function and electrolytes with serum cr at 0.77, K at 3,8 and serum bicarbonate 35.   6. HTN.Continue with carvedilol for blood pressure control. Episodic hypertension triggered from pain.  7. Depression. Continue with bupropion, fluoxetine, mirtazapine and primidone.  8. 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  Calculated BMI is 39.9. Patient with apnea episode at nigh during sleep.  Will do a trial of Cpap at  night.   9. Chronic  anemia. Hgb and hct have been stable 11,3 to 9,8.   Status is: Inpatient  Remains inpatient appropriate because:Inpatient level of care appropriate due to severity of illness   Dispo: The patient is from: SNF              Anticipated d/c is to: SNF              Anticipated d/c date is: 2 days              Patient currently is not medically stable to d/c.   DVT prophylaxis: Enoxaparin   Code Status:   full  Family Communication:  I spoke with patient's family at the bedside, we talked in detail about patient's condition, plan of care and prognosis and all questions were addressed.      Nutrition Status: Nutrition Problem: Increased nutrient needs Etiology: post-op healing (planned percutaneous fixation of left femoral hip fracture) Signs/Symptoms: estimated needs Interventions: MVI, Ensure Enlive (each supplement provides 350kcal and 20 grams of protein)    Consultants:   Orthopedics  Cardiology   Pulmonary    Procedures:  cannulated screw fixation for the left femoral neck to the left hip   Subjective: Patient continue to have dyspnea, now close to her baseline, but continue to have congestion and cough. No nausea or vomiting/    Objective: Vitals:   08/09/20 2158 08/10/20 0011 08/10/20 0536 08/10/20 0839  BP: (!) 108/55 (!) 95/55 91/62 (!) 102/40  Pulse: 91 70 65 83  Resp: 19 20 20 19   Temp: 98.1 F (36.7 C) 97.9 F (36.6 C) 97.6 F (36.4 C) 97.8 F (36.6 C)  TempSrc: Oral Oral Oral Oral  SpO2: 97% 99% 100% 94%  Weight:      Height:        Intake/Output Summary (Last 24 hours) at 08/10/2020 0921 Last data filed at 08/10/2020 0600 Gross per 24 hour  Intake 1100 ml  Output 2100 ml  Net -1000 ml   Filed Weights   08/05/20 0052  Weight: 108.9 kg    Examination:   General: Not in pain. Positive dyspnea at rest,. Neurology: Awake and alert, non focal. Deconditioned  E ENT: mild pallor, no icterus, oral mucosa moist Cardiovascular: No JVD. S1-S2  present, rhythmic, no gallops, rubs, or murmurs. No lower extremity edema. Pulmonary: positive breath sounds bilaterally, decreased air movement, positive expiratory wheezing, with scattered bilateral rhonchi and rales. Gastrointestinal. Abdomen protuberant soft and non tender Skin. No rashes Musculoskeletal: no joint deformities     Data Reviewed: I have personally reviewed following labs and imaging studies  CBC: Recent Labs  Lab 08/05/20 0054 08/06/20 0549 08/09/20 0504 08/10/20 0721  WBC 12.4* 11.3* 9.2 10.3  NEUTROABS 10.5*  --  6.7  --   HGB 12.2 12.0 11.3* 9.8*  HCT 36.5 36.2 35.7* 31.3*  MCV 93.8 96.3 97.3 98.7  PLT 241 224 211 371   Basic Metabolic Panel: Recent Labs  Lab 08/05/20 0054 08/06/20 0549 08/08/20 0643 08/09/20 0504 08/10/20 0721  NA 135 141 138 138 136  K 4.1 3.6 4.5 4.1 3.8  CL 92* 98 93* 90* 88*  CO2 32 34* 35* 38* 35*  GLUCOSE 137* 102* 127* 144* 87  BUN 31* 29* 32* 35* 26*  CREATININE 0.83 0.78 0.74 0.79 0.77  CALCIUM 8.9 9.2 9.4 9.3 8.6*   GFR: Estimated Creatinine Clearance: 72.3 mL/min (by C-G formula based on SCr of 0.77 mg/dL). Liver Function Tests: Recent  Labs  Lab 08/05/20 0054  AST 17  ALT 19  ALKPHOS 87  BILITOT 1.1  PROT 7.0  ALBUMIN 4.0   No results for input(s): LIPASE, AMYLASE in the last 168 hours. No results for input(s): AMMONIA in the last 168 hours. Coagulation Profile: No results for input(s): INR, PROTIME in the last 168 hours. Cardiac Enzymes: No results for input(s): CKTOTAL, CKMB, CKMBINDEX, TROPONINI in the last 168 hours. BNP (last 3 results) No results for input(s): PROBNP in the last 8760 hours. HbA1C: No results for input(s): HGBA1C in the last 72 hours. CBG: Recent Labs  Lab 08/09/20 1640 08/09/20 2037 08/10/20 0014 08/10/20 0414 08/10/20 0838  GLUCAP 147* 146* 131* 89 83   Lipid Profile: No results for input(s): CHOL, HDL, LDLCALC, TRIG, CHOLHDL, LDLDIRECT in the last 72 hours. Thyroid  Function Tests: No results for input(s): TSH, T4TOTAL, FREET4, T3FREE, THYROIDAB in the last 72 hours. Anemia Panel: No results for input(s): VITAMINB12, FOLATE, FERRITIN, TIBC, IRON, RETICCTPCT in the last 72 hours.    Radiology Studies: I have reviewed all of the imaging during this hospital visit personally     Scheduled Meds: . budesonide  0.25 mg Inhalation BID  . buPROPion  100 mg Oral Daily  . carvedilol  3.125 mg Oral QHS  . Chlorhexidine Gluconate Cloth  6 each Topical Daily  . docusate sodium  100 mg Oral BID  . enoxaparin (LOVENOX) injection  40 mg Subcutaneous Q24H  . feeding supplement (ENSURE ENLIVE)  237 mL Oral BID BM  . FLUoxetine  60 mg Oral Daily  . fluticasone  1 spray Each Nare BID  . gabapentin  200 mg Oral TID  . guaiFENesin-dextromethorphan  5 mL Oral Q6H  . insulin aspart  0-20 Units Subcutaneous Q4H  . ipratropium-albuterol  3 mL Inhalation TID  . melatonin  5 mg Oral QHS  . methylPREDNISolone (SOLU-MEDROL) injection  20 mg Intravenous Q24H  . mirtazapine  7.5 mg Oral QHS  . montelukast  10 mg Oral QHS  . multivitamin-lutein  1 capsule Oral Daily  . primidone  50 mg Oral QHS  . senna  1 tablet Oral BID  . simvastatin  20 mg Oral Daily  . spironolactone  25 mg Oral Daily  . torsemide  40 mg Oral BID   Continuous Infusions: .  ceFAZolin (ANCEF) IV    . methocarbamol (ROBAXIN) IV       LOS: 5 days        Styles Fambro Gerome Apley, MD

## 2020-08-10 NOTE — Evaluation (Signed)
Physical Therapy Evaluation Patient Details Name: Sara Pierce MRN: [id number] DOB: Mar 22, 1941 Today's Date: 08/10/2020   History of Present Illness  Patient presents with closed left hip fx from nursing home where she was transferring (slideboard) with a PT and felt a pop in her Left hip. Patient is non ambulatory at baseline and utilizes a hoyer lift. PMH includes depression, diabetes, sleep apnea, hypothyroidism, COPD, CHF, 3L 02 at home, osteoporosis,obesity, cerebral hemorrhage, clotting disorder, incontinence.    Clinical Impression  Patient is a pleasant 79 year old female whose evaluation was limited due to patient's fear of mobility and refusal to attempt OOB interventions. 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 Patient has plantarflexion contractures of bilateral LEs and is currently NWB LLE at this time. Patient refused to attempt supine to sit EOB but was agreeable to supine strengthening of RLE, refusing to perform LLE at this time despite education.  Patient has one functional hand and reports that once she is transferred to a chair she is unable to move from there, would like to have a power chair that she could use to leave her room and move around within her room. Patient would benefit from skilled physical therapy while in the hospital to increase mobility, strength, and reduce risk for falls and pressure wounds. Upon discharge patient will benefit from return to SNF for safety and to address limited mobility.     Follow Up Recommendations SNF    Equipment Recommendations  Other (comment) Orthoptist)    Recommendations for Other Services       Precautions / Restrictions Precautions Precautions: Fall Precaution Comments: hoyer lift at baseline Restrictions Weight Bearing Restrictions: Yes LLE Weight Bearing: Non weight bearing      Mobility  Bed Mobility Overal bed  mobility: Needs Assistance             General bed mobility comments: Patient refused supine to sit transfer despite PT encouragement, declined rolling.  Transfers                 General transfer comment: deferred due to patient refusal  Ambulation/Gait             General Gait Details: non ambulatory at baseline  Stairs            Wheelchair Mobility    Modified Rankin (Stroke Patients Only)       Balance Overall balance assessment: Needs assistance;History of Falls     Sitting balance - Comments: unable to test due to patient refusal to sit EOB       Standing balance comment: unable to be tested due to patient refusal                             Pertinent Vitals/Pain Pain Assessment: 0-10 Pain Score: 10-Worst pain ever Pain Location: post surgical hip Pain Descriptors / Indicators: Aching Pain Intervention(s): Limited activity within patient's tolerance;Repositioned;Premedicated before session    Home Living Family/patient expects to be discharged to:: Skilled nursing facility                 Additional Comments: Patient is at [facility] and uses a hoyer lift at baseline.    Prior Function Level of Independence: Needs assistance   Gait / Transfers Assistance Needed: Patient is dependent upon hoyer lift for transfer to w/c at baseline.  Was attempting slide board transfer when fx hip. Unable to propel wheelchair due to one only one functional UE  ADL's / Homemaking Assistance Needed: requires assistance for all ADLs due to limited use of LUE  Comments: patient is very fearful of movement and falling.     Hand Dominance   Dominant Hand: Right    Extremity/Trunk Assessment   Upper Extremity Assessment Upper Extremity Assessment: Defer to OT evaluation    Lower Extremity Assessment Lower Extremity Assessment: RLE deficits/detail;LLE deficits/detail RLE Deficits / Details: unable to test EOB, has plantarflexion  contractions, able to lift and move within bed grossly 3/5 RLE Sensation: decreased light touch RLE Coordination: decreased gross motor LLE Deficits / Details: patient refused to move LLE LLE: Unable to fully assess due to pain       Communication   Communication: No difficulties  Cognition Arousal/Alertness: Awake/alert Behavior During Therapy: WFL for tasks assessed/performed;Anxious Overall Cognitive Status: Within Functional Limits for tasks assessed                                 General Comments: Patient fearful of movement of surgical limb, very hesitant and declines most movement.      General Comments General comments (skin integrity, edema, etc.): Patient has extreme L sided trunk flexion in bed requiring use of pillows and towels to obtain neutral position    Exercises General Exercises - Lower Extremity Ankle Circles/Pumps: Both;10 reps;Supine Quad Sets: Strengthening;Both;10 reps;Supine Gluteal Sets: Strengthening;10 reps;Both;Supine Heel Slides: Strengthening;Right;10 reps;Supine Hip ABduction/ADduction: Strengthening;Right;10 reps;Supine Straight Leg Raises: Strengthening;Right;5 reps;Supine Other Exercises Other Exercises: Patient educated on role of acute care PT, requires cueing for supine strengthening due to patient refusal to attempt EOB, bed mobility, or transfer.   Assessment/Plan    PT Assessment Patient needs continued PT services  PT Problem List Decreased strength;Decreased activity tolerance;Decreased balance;Decreased knowledge of use of DME;Decreased mobility;Decreased safety awareness;Decreased knowledge of precautions;Pain;Obesity       PT Treatment Interventions DME instruction;Functional mobility training;Neuromuscular re-education;Balance training;Therapeutic exercise;Therapeutic activities;Patient/family education;Wheelchair mobility training;Manual techniques    PT Goals (Current goals can be found in the Care Plan section)   Acute Rehab PT Goals Patient Stated Goal: to have less pain and get a powerchair/scooter PT Goal Formulation: With patient Time For Goal Achievement: 08/24/20 Potential to Achieve Goals: Fair    Frequency 7X/week   Barriers to discharge   will return to SNF    Co-evaluation               AM-PAC PT "6 Clicks" Mobility  Outcome Measure Help needed turning from your back to your side while in a flat bed without using bedrails?: A Lot Help needed moving from lying on your back to sitting on the side of a flat bed without using bedrails?: Total Help needed moving to and from a bed to a chair (including a wheelchair)?: Total Help needed standing up from a chair using your arms (e.g., wheelchair or bedside chair)?: Total Help needed to walk in hospital room?: Total Help needed climbing 3-5 steps with a railing? : Total 6 Click Score: 7    End of Session   Activity Tolerance: Other (comment) (patient fear of movement and refusal to attempt mobility) Patient left: in bed;with call bell/phone within reach;with bed alarm set Nurse Communication: Mobility status;Weight bearing status PT Visit Diagnosis: Unsteadiness on feet (R26.81);Muscle weakness (generalized) (M62.81);History of falling (Z91.81);Other abnormalities of gait and mobility (R26.89);Pain  Pain - Right/Left: Left Pain - part of body: Hip    Time: 6887-3730 PT Time Calculation (min) (ACUTE ONLY): 12 min   Charges:   PT Evaluation $PT Eval Low Complexity: 1 Low PT Treatments $Therapeutic Exercise: 8-22 mins      Janna Arch, PT, DPT    08/10/2020, 12:54 PM

## 2020-08-10 NOTE — Progress Notes (Signed)
Subjective:  POD #1 s/p percutaneous fixation for left impacted femoral neck hip fracture.   Patient reports left hip pain as moderate.  Patient denies any respiratory distress.  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 Neurovascular intact Sensation intact distally Intact pulses distally Dorsiflexion/Plantar flexion intact Incision: moderate serosanguineous drainage on the honeycomb dressing No cellulitis present Compartment soft  LABS  Results for orders placed or performed during the hospital encounter of 08/05/20 (from the past 24 hour(s))  Glucose, capillary     Status: Abnormal   Collection Time: 08/09/20 12:44 PM  Result Value Ref Range   Glucose-Capillary 106 (H) 70 - 99 mg/dL  Glucose, capillary     Status: None   Collection Time: 08/09/20  1:04 PM  Result Value Ref Range   Glucose-Capillary 89 70 - 99 mg/dL  Glucose, capillary     Status: Abnormal   Collection Time: 08/09/20  4:40 PM  Result Value Ref Range   Glucose-Capillary 147 (H) 70 - 99 mg/dL  Glucose, capillary     Status: Abnormal   Collection Time: 08/09/20  8:37 PM  Result Value Ref Range   Glucose-Capillary 146 (H) 70 - 99 mg/dL   Comment 1 Notify RN   Glucose, capillary     Status: Abnormal   Collection Time: 08/10/20 12:14 AM  Result Value Ref Range   Glucose-Capillary 131 (H) 70 - 99 mg/dL   Comment 1 Notify RN   Glucose, capillary     Status: None   Collection Time: 08/10/20  4:14 AM  Result Value Ref Range   Glucose-Capillary 89 70 - 99 mg/dL  CBC      Status: Abnormal   Collection Time: 08/10/20  7:21 AM  Result Value Ref Range   WBC 10.3 4.0 - 10.5 K/uL   RBC 3.17 (L) 3.87 - 5.11 MIL/uL   Hemoglobin 9.8 (L) 12.0 - 15.0 g/dL   HCT 31.3 (L) 36 - 46 %   MCV 98.7 80.0 - 100.0 fL   MCH 30.9 26.0 - 34.0 pg   MCHC 31.3 30.0 - 36.0 g/dL   RDW 15.8 (H) 11.5 - 15.5 %   Platelets 184 150 - 400 K/uL   nRBC 0.3 (H) 0.0 - 0.2 %  Basic metabolic panel     Status: Abnormal   Collection Time: 08/10/20  7:21 AM  Result Value Ref Range   Sodium 136 135 - 145 mmol/L   Potassium 3.8 3.5 - 5.1 mmol/L   Chloride 88 (L) 98 - 111 mmol/L   CO2 35 (H) 22 - 32 mmol/L   Glucose, Bld 87 70 - 99 mg/dL   BUN 26 (H) 8 - 23 mg/dL   Creatinine, Ser 0.77 0.44 - 1.00 mg/dL   Calcium 8.6 (L) 8.9 - 10.3 mg/dL   GFR calc non Af Amer >60 >60 mL/min   GFR calc Af Amer >60 >60 mL/min   Anion gap 13 5 -  15  Glucose, capillary     Status: None   Collection Time: 08/10/20  8:38 AM  Result Value Ref Range   Glucose-Capillary 83 70 - 99 mg/dL    DG Hip Port Unilat With Pelvis 1V Left  Result Date: 08/09/2020 CLINICAL DATA:  Post LEFT hip pinning EXAM: DG HIP (WITH OR WITHOUT PELVIS) 1V PORT LEFT COMPARISON:  Portable exam 1105 hours compared to earlier intraoperative images of 08/09/2020 FINDINGS: Four cannulated screws present at the proximal LEFT femur post pinning of a LEFT femoral neck fracture. No dislocation. Marked osseous demineralization. Advanced degenerative changes of the RIGHT hip joint. Atherosclerotic calcifications of the iliac arteries. IMPRESSION: Post pinning of LEFT femoral neck fracture. Electronically Signed   By: Lavonia Dana M.D.   On: 08/09/2020 11:54   DG HIP OPERATIVE UNILAT W OR W/O PELVIS LEFT  Result Date: 08/09/2020 CLINICAL DATA:  Left cannulated hip pinning. EXAM: OPERATIVE LEFT HIP (WITH PELVIS IF PERFORMED) TECHNIQUE: Fluoroscopic spot image(s) were submitted for interpretation post-operatively. COMPARISON:  Preoperative radiograph  08/05/2020 FINDINGS: Four fluoroscopic spot views obtained in the operating room during multi screw fixation of left proximal femur fracture. Total fluoroscopy time 1 minutes 18 seconds. IMPRESSION: Procedural fluoroscopy during pinning of left hip fracture. Electronically Signed   By: Keith Rake M.D.   On: 08/09/2020 15:50    Assessment/Plan: 1 Day Post-Op   Principal Problem:   Closed left hip fracture, initial encounter ([place]) Active Problems:   Clinical depression   Diabetes (HCC)   Apnea, sleep   Hypothyroidism   COPD (chronic obstructive pulmonary disease) (HCC)   Chronic diastolic heart failure (HCC)   Chronic respiratory failure with hypoxia (HCC)   Preoperative clearance   Osteoporotic fracture of left hip, initial encounter [facility])  Patient is stable postop.  Begin physical therapy.  Patient is nonambulatory at baseline and will be bed to chair transfer only.  Patient is nonweightbearing on the left hip.  Patient will restart her Plavix today.  Patient cannot take aspirin and therefore is on Lovenox for DVT prophylaxis.  Hemoglobin and hematocrit are stable.    Thornton Park , MD 08/10/2020, 11:23 AM

## 2020-08-10 NOTE — Progress Notes (Signed)
PT Cancellation Note  Patient Details Name: MAKHIA VOSLER MRN: [id number] DOB: 1941-02-09   Cancelled Treatment:    Reason Eval/Treat Not Completed: Patient declined, no reason specified;Other (comment) (Patient consult received and reviewed. Patient in bed eating breakfast with visitor present, requests to finish breakfast first. Will attempt again at later time/date as available.)  Janna Arch, PT, DPT   08/10/2020, 10:43 AM

## 2020-08-11 ENCOUNTER — Encounter: Payer: Self-pay | Admitting: Orthopedic Surgery

## 2020-08-11 LAB — BASIC METABOLIC PANEL
Anion gap: 10 (ref 5–15)
BUN: 28 mg/dL — ABNORMAL HIGH (ref 8–23)
CO2: 37 mmol/L — ABNORMAL HIGH (ref 22–32)
Calcium: 8.7 mg/dL — ABNORMAL LOW (ref 8.9–10.3)
Chloride: 90 mmol/L — ABNORMAL LOW (ref 98–111)
Creatinine, Ser: 0.81 mg/dL (ref 0.44–1.00)
GFR calc Af Amer: >60 mL/min (ref >60)
GFR calc non Af Amer: >60 mL/min (ref >60)
Glucose, Bld: 103 mg/dL — ABNORMAL HIGH (ref 70–99)
Potassium: 4 mmol/L (ref 3.5–5.1)
Sodium: 137 mmol/L (ref 135–145)

## 2020-08-11 LAB — GLUCOSE, CAPILLARY
Glucose-Capillary: 102 mg/dL — ABNORMAL HIGH (ref 70–99)
Glucose-Capillary: 107 mg/dL — ABNORMAL HIGH (ref 70–99)
Glucose-Capillary: 147 mg/dL — ABNORMAL HIGH (ref 70–99)
Glucose-Capillary: 97 mg/dL (ref 70–99)
Glucose-Capillary: 122 mg/dL — ABNORMAL HIGH (ref 70–99)

## 2020-08-11 LAB — CBC
HCT: 31.3 % — ABNORMAL LOW (ref 36.0–46.0)
Hemoglobin: 9.9 g/dL — ABNORMAL LOW (ref 12.0–15.0)
MCH: 31.4 pg (ref 26.0–34.0)
MCHC: 31.6 g/dL (ref 30.0–36.0)
MCV: 99.4 fL (ref 80.0–100.0)
Platelets: 178 10*3/uL (ref 150–400)
RBC: 3.15 MIL/uL — ABNORMAL LOW (ref 3.87–5.11)
RDW: 16 % — ABNORMAL HIGH (ref 11.5–15.5)
WBC: 11.9 10*3/uL — ABNORMAL HIGH (ref 4.0–10.5)
nRBC: 0.4 % — ABNORMAL HIGH (ref 0.0–0.2)

## 2020-08-11 MED ORDER — CLOPIDOGREL BISULFATE 75 MG PO TABS
75.0000 mg | ORAL_TABLET | Freq: Every day | ORAL | Status: DC
  Administered 2020-08-11 – 2020-08-12 (×2): 75 mg via ORAL
  Filled 2020-08-11 (×2): qty 1

## 2020-08-11 NOTE — Progress Notes (Addendum)
PROGRESS NOTE    Sara Pierce  JQB:[id number] DOB: May 01, 1941 DOA: 08/05/2020 PCP: Marygrace Drought, MD    Brief Narrative:  Patient admitted to the hospitalwith theworking diagnosis of acute left hip fracturein the setting of COPD exacerbation.  79 year old female with past medical history for diastolic heart failure, sleep apnea, type 2 diabetes mellitus, hypertension, hypothyroidism, COPD, chronic hypoxic respiratory failure, depression, osteoporosis, obesity and nonambulatory state. Patient is a nursing home resident, while doing physical therapy she felt a pop in her left hip, further radiographic evaluation revealed left hip fracture. On her initial physical examination blood pressure 123/73, heart rate 63, respiratory rate11, temperature 98.1, oxygen saturation 99%.Her lungs had decreased breath sounds bilaterally no wheezing, rales or rhonchi, heart S1-S2, present rhythmic, abdomen soft, no lower extremity edema. SARS COVID-19 negative. Left hip films with impacted subcapital left femoral neck fracture.Chest radiograph with left rotation, increased lung markings bilaterally, no infiltrates. EKG 55 bpm, normal axis, right bundle branch block, sinus with sinus arrhythmia, no ST segment or T wave changes, poor baseline  Surgery has been delayed due to COPD exacerbation. Patient continue with significant wheezing and dyspnea. Not yet back to her baseline.  Suspect patient has significant decreased pulmonary function due to chronic COPD.  Patient underwent cannulated screw fixation for the left femoral neck to the left hip fracture with no major complications.  Patient noted to have bleeding from surgical site this am. Her dyspnea continue to improved and now is back to baseline. Tolerated well Cpap at night.    Assessment & Plan:   Principal Problem:   Closed left hip fracture, initial encounter [facility]) Active Problems:   Clinical depression   Diabetes (HCC)   Apnea,  sleep   Hypothyroidism   COPD (chronic obstructive pulmonary disease) (HCC)   Chronic diastolic heart failure (HCC)   Chronic respiratory failure with hypoxia (HCC)   Preoperative clearance   Osteoporotic fracture of left hip, initial encounter ([place])   1. Closed left hip fracture.sp left femoral neck screw fixation. not ambulatory since 2018 after a fall with left elbow and spine injury. Post operative pain is controlled, today she was noted to have bleeding at surgical site.   Pain control with scheduled po acetaminophen, gabapentin, cyclobenzaprine and as needed hydrocodone..   Hold on enoxaparin for now.     If nor further bleeding from surgical site, will plan her to return to SNF in am.   2. COPD exacerbation with acute on chronic hypoxic respiratory failure.chronic dyspnea, symptoms seem to be at baseline.   Continue with bronchodilator therapy, inhaled corticosteroids, antitussive agentsand nasal decongestants.On chest PT, and airway clearing techniques with flutter valve and incentive spirometer.   Continue taper of prednisone and oxymetry monitoring.   Continue mechanical soft diet and aspiration precautions.   3. Diastolic heart failure.Continue diuretic therapy with torsemide and spironolactone. No exacerbation.  Continue on clopidogrel.   4. Controlled T2DM Hgb A1c is 5,4. Dyslipidemia. Fasting glucose this am is 103 mg/dl.Continue with insulin sliding scalae for glucose cover and monitoring.  Continue with statin therapy. She is tolerating po well.   5.CKD stage 2 withHypokalemia. K has been corrected to 4,0, renal function stable with serum cr at 0,81.   6. HTN.on carvedilol with good toleration.  7. Depression. on bupropion, fluoxetine, mirtazapine and primidone.  8. 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  Calculated BMI is 39.9. Patient tolerated well Cpap at night, will continue this qhs.   9. Chronic anemia. Hgb and hct have been stable 9,9  this am, will  check H&H in am. Will hold on enoxaparin due to surgical site bleeding.      Status is: Inpatient  Remains inpatient appropriate because:Inpatient level of care appropriate due to severity of illness   Dispo: The patient is from: SNF              Anticipated d/c is to: SNF              Anticipated d/c date is: 1 day              Patient currently is not medically stable to d/c.    DVT prophylaxis: scd   Code Status:   full  Family Communication:  No family at the bedside     Nutrition Status: Nutrition Problem: Increased nutrient needs Etiology: post-op healing (planned percutaneous fixation of left femoral hip fracture) Signs/Symptoms: estimated needs Interventions: MVI, Ensure Enlive (each supplement provides 350kcal and 20 grams of protein)     Skin Documentation: Pressure Injury 01/17/20 Thigh Posterior;Proximal;Right Stage 2 -  Partial thickness loss of dermis presenting as a shallow open injury with a red, pink wound bed without slough. stage 2 pressure injury (Active)  01/17/20 0145  Location: Thigh  Location Orientation: Posterior;Proximal;Right  Staging: Stage 2 -  Partial thickness loss of dermis presenting as a shallow open injury with a red, pink wound bed without slough.  Wound Description (Comments): stage 2 pressure injury  Present on Admission: Yes     Pressure Injury 01/17/20 Buttocks Left Stage 2 -  Partial thickness loss of dermis presenting as a shallow open injury with a red, pink wound bed without slough. stage 2 pressure ulcer (Active)  01/17/20 0146  Location: Buttocks  Location Orientation: Left  Staging: Stage 2 -  Partial thickness loss of dermis presenting as a shallow open injury with a red, pink wound bed without slough.  Wound Description (Comments): stage 2 pressure ulcer  Present on Admission: Yes     Consultants:   Orthopedics  Cardiology   Pulmonary   Procedures:  cannulated screw fixation for the left femoral neck to the  left hip     Subjective: Patient continue to be very weak and deconditioned, this am had bleeding from her surgical site, no nausea or vomiting, no chest pain. Her dyspnea seems to be back to baseline.   Objective: Vitals:   08/10/20 1748 08/10/20 2008 08/11/20 0014 08/11/20 0800  BP: 112/76  (!) 88/52 (!) 114/51  Pulse: 75  79 64  Resp: 19  20 18   Temp: 99.5 F (37.5 C)  98.8 F (37.1 C) 99 F (37.2 C)  TempSrc: Oral  Oral Oral  SpO2: 100% 95% 100% 98%  Weight:      Height:        Intake/Output Summary (Last 24 hours) at 08/11/2020 1239 Last data filed at 08/11/2020 0930 Gross per 24 hour  Intake 0 ml  Output 750 ml  Net -750 ml   Filed Weights   08/05/20 0052  Weight: 108.9 kg    Examination:   General: Not in pain, positive dyspnea at rest.  Neurology: Awake and alert, non focal  E ENT: mild pallor, no icterus, oral mucosa moist Cardiovascular: No JVD. S1-S2 present, rhythmic, no gallops, rubs, or murmurs. No lower extremity edema. Pulmonary: positive breath sounds bilaterally, decreased air movement, no distal bronchial wheezing, rhonchi or rales. Positive proximal rhonchi.  Gastrointestinal. Abdomen protuberant and non tender Skin. Left hip surgical dressing in place.  Musculoskeletal: no joint deformities     Data Reviewed: I have personally reviewed following labs and imaging studies  CBC: Recent Labs  Lab 08/05/20 0054 08/06/20 0549 08/09/20 0504 08/10/20 0721 08/11/20 0500  WBC 12.4* 11.3* 9.2 10.3 11.9*  NEUTROABS 10.5*  --  6.7  --   --   HGB 12.2 12.0 11.3* 9.8* 9.9*  HCT 36.5 36.2 35.7* 31.3* 31.3*  MCV 93.8 96.3 97.3 98.7 99.4  PLT 241 224 211 184 536   Basic Metabolic Panel: Recent Labs  Lab 08/06/20 0549 08/08/20 0643 08/09/20 0504 08/10/20 0721 08/11/20 0500  NA 141 138 138 136 137  K 3.6 4.5 4.1 3.8 4.0  CL 98 93* 90* 88* 90*  CO2 34* 35* 38* 35* 37*  GLUCOSE 102* 127* 144* 87 103*  BUN 29* 32* 35* 26* 28*  CREATININE  0.78 0.74 0.79 0.77 0.81  CALCIUM 9.2 9.4 9.3 8.6* 8.7*   GFR: Estimated Creatinine Clearance: 71.4 mL/min (by C-G formula based on SCr of 0.81 mg/dL). Liver Function Tests: Recent Labs  Lab 08/05/20 0054  AST 17  ALT 19  ALKPHOS 87  BILITOT 1.1  PROT 7.0  ALBUMIN 4.0   No results for input(s): LIPASE, AMYLASE in the last 168 hours. No results for input(s): AMMONIA in the last 168 hours. Coagulation Profile: No results for input(s): INR, PROTIME in the last 168 hours. Cardiac Enzymes: No results for input(s): CKTOTAL, CKMB, CKMBINDEX, TROPONINI in the last 168 hours. BNP (last 3 results) No results for input(s): PROBNP in the last 8760 hours. HbA1C: No results for input(s): HGBA1C in the last 72 hours. CBG: Recent Labs  Lab 08/10/20 2052 08/10/20 2331 08/11/20 0415 08/11/20 0756 08/11/20 1136  GLUCAP 129* 130* 102* 107* 147*   Lipid Profile: No results for input(s): CHOL, HDL, LDLCALC, TRIG, CHOLHDL, LDLDIRECT in the last 72 hours. Thyroid Function Tests: No results for input(s): TSH, T4TOTAL, FREET4, T3FREE, THYROIDAB in the last 72 hours. Anemia Panel: No results for input(s): VITAMINB12, FOLATE, FERRITIN, TIBC, IRON, RETICCTPCT in the last 72 hours.    Radiology Studies: I have reviewed all of the imaging during this hospital visit personally     Scheduled Meds: . budesonide  0.25 mg Inhalation BID  . buPROPion  100 mg Oral Daily  . carvedilol  3.125 mg Oral QHS  . Chlorhexidine Gluconate Cloth  6 each Topical Daily  . clopidogrel  75 mg Oral Daily  . cyclobenzaprine  5 mg Oral TID  . docusate sodium  100 mg Oral BID  . enoxaparin (LOVENOX) injection  40 mg Subcutaneous Q24H  . feeding supplement (ENSURE ENLIVE)  237 mL Oral BID BM  . FLUoxetine  60 mg Oral Daily  . fluticasone  1 spray Each Nare BID  . gabapentin  200 mg Oral TID  . guaiFENesin-dextromethorphan  5 mL Oral Q6H  . insulin aspart  0-20 Units Subcutaneous Q4H  . ipratropium-albuterol   3 mL Inhalation TID  . melatonin  5 mg Oral QHS  . mirtazapine  7.5 mg Oral QHS  . montelukast  10 mg Oral QHS  . multivitamin-lutein  1 capsule Oral Daily  . primidone  50 mg Oral QHS  . senna  1 tablet Oral BID  . simvastatin  20 mg Oral Daily  . spironolactone  25 mg Oral Daily  . torsemide  40 mg Oral BID   Continuous Infusions: .  ceFAZolin (ANCEF) IV    . methocarbamol (ROBAXIN) IV  LOS: 6 days        Sara Manner Gerome Apley, MD

## 2020-08-11 NOTE — Care Management Important Message (Signed)
Important Message  Patient Details  Name: Sara Pierce MRN: [id number] Date of Birth: [birth year]   Medicare Important Message Given:  Yes     Dannette Barbara 08/11/2020, 1:16 PM

## 2020-08-11 NOTE — Progress Notes (Signed)
Subjective:  POD #2 s/p percutaneous fixation for left femoral neck hip fracture.   Patient reports left hip pain as moderate.    Objective:   VITALS:   Vitals:   08/10/20 1748 08/10/20 2008 08/11/20 0014 08/11/20 0800  BP: 112/76  (!) 88/52 (!) 114/51  Pulse: 75  79 64  Resp: 19  20 18   Temp: 99.5 F (37.5 C)  98.8 F (37.1 C) 99 F (37.2 C)  TempSrc: Oral  Oral Oral  SpO2: 100% 95% 100% 98%  Weight:      Height:        PHYSICAL EXAM: Left lower extremity Neurovascular intact Sensation intact distally Intact pulses distally Dorsiflexion/Plantar flexion intact Incision: Dressing saturated No cellulitis present Compartment soft  LABS  Results for orders placed or performed during the hospital encounter of 08/05/20 (from the past 24 hour(s))  Glucose, capillary     Status: Abnormal   Collection Time: 08/10/20  5:25 PM  Result Value Ref Range   Glucose-Capillary 132 (H) 70 - 99 mg/dL  Glucose, capillary     Status: Abnormal   Collection Time: 08/10/20  8:52 PM  Result Value Ref Range   Glucose-Capillary 129 (H) 70 - 99 mg/dL   Comment 1 Notify RN    Comment 2 Document in Chart   Glucose, capillary     Status: Abnormal   Collection Time: 08/10/20 11:31 PM  Result Value Ref Range   Glucose-Capillary 130 (H) 70 - 99 mg/dL   Comment 1 Notify RN    Comment 2 Document in Chart   Glucose, capillary     Status: Abnormal   Collection Time: 08/11/20  4:15 AM  Result Value Ref Range   Glucose-Capillary 102 (H) 70 - 99 mg/dL   Comment 1 Notify RN   CBC     Status: Abnormal   Collection Time: 08/11/20  5:00 AM  Result Value Ref Range   WBC 11.9 (H) 4.0 - 10.5 K/uL   RBC 3.15 (L) 3.87 - 5.11 MIL/uL   Hemoglobin 9.9 (L) 12.0 - 15.0 g/dL   HCT 31.3 (L) 36 - 46 %   MCV 99.4 80.0 - 100.0 fL   MCH 31.4 26.0 - 34.0 pg   MCHC 31.6 30.0 - 36.0 g/dL   RDW 16.0 (H) 11.5 - 15.5 %   Platelets 178 150 - 400 K/uL   nRBC 0.4 (H) 0.0 - 0.2 %  Basic metabolic panel     Status:  Abnormal   Collection Time: 08/11/20  5:00 AM  Result Value Ref Range   Sodium 137 135 - 145 mmol/L   Potassium 4.0 3.5 - 5.1 mmol/L   Chloride 90 (L) 98 - 111 mmol/L   CO2 37 (H) 22 - 32 mmol/L   Glucose, Bld 103 (H) 70 - 99 mg/dL   BUN 28 (H) 8 - 23 mg/dL   Creatinine, Ser 0.81 0.44 - 1.00 mg/dL   Calcium 8.7 (L) 8.9 - 10.3 mg/dL   GFR calc non Af Amer >60 >60 mL/min   GFR calc Af Amer >60 >60 mL/min   Anion gap 10 5 - 15  Glucose, capillary     Status: Abnormal   Collection Time: 08/11/20  7:56 AM  Result Value Ref Range   Glucose-Capillary 107 (H) 70 - 99 mg/dL   Comment 1 Notify RN    Comment 2 Document in Chart   Glucose, capillary     Status: Abnormal   Collection Time: 08/11/20 11:36 AM  Result Value Ref Range   Glucose-Capillary 147 (H) 70 - 99 mg/dL   Comment 1 Notify RN    Comment 2 Document in Chart     No results found.  Assessment/Plan: 2 Days Post-Op   Principal Problem:   Closed left hip fracture, initial encounter ([place]) Active Problems:   Clinical depression   Diabetes (HCC)   Apnea, sleep   Hypothyroidism   COPD (chronic obstructive pulmonary disease) (HCC)   Chronic diastolic heart failure (HCC)   Chronic respiratory failure with hypoxia (HCC)   Preoperative clearance   Osteoporotic fracture of left hip, initial encounter ([place])  Patient's hemoglobin hematocrit remained stable despite persistent sanguinous drainage from her incision.  Patient will continue her Plavix but I am placing Lovenox on hold until bleeding from the incision slows.  Patient is nonweightbearing on the left lower extremity.  She may be transferred bed to chair.  Patient will need skilled nursing facility upon discharge.  Dressings will be changed or reinforced as needed.      Thornton Park , MD 08/11/2020, 1:08 PM

## 2020-08-11 NOTE — Progress Notes (Signed)
Physical Therapy Treatment Patient Details Name: Sara Pierce MRN: [id number] DOB: Jun 28, 1941 Today's Date: 08/11/2020    History of Present Illness Patient presents with closed left hip fx from nursing home where she was transferring (slideboard) with a PT and felt a pop in her Left hip. Patient is non ambulatory at baseline and utilizes a hoyer lift. PMH includes depression, diabetes, sleep apnea, hypothyroidism, COPD, CHF, 3L 02 at home, osteoporosis,obesity, cerebral hemorrhage, clotting disorder, incontinence.    PT Comments    Pt received in upright position in bed.  Pt agreeable to therapy, however requests for pericare prior to attempting therapy.  NT was able to assist with bed mobility for pericare.  Pt requires +2 maxA for physical assistance to obtain and mainting side-lying position.  Pt is experiencing significant mount of blood at the incision site and MD and surgeon were notified about site.  Pt was able to perform supine bed exercises 2without much difficulty, but continues to refuse to perform on the LLE.  Pt was left with nursing present in the room and all questions addressed.  Pt reported that she would do more tomorrow.  Pt will benefit from PT services in a SNF setting upon discharge to safely address deficits listed in patient problem list for decreased caregiver assistance and eventual return to PLOF.    Follow Up Recommendations  SNF     Equipment Recommendations  Other (comment) Orthoptist)    Recommendations for Other Services       Precautions / Restrictions Precautions Precautions: Fall Precaution Comments: hoyer lift at baseline Restrictions Weight Bearing Restrictions: Yes LLE Weight Bearing: Non weight bearing    Mobility  Bed Mobility Overal bed mobility: Needs Assistance Bed Mobility: Rolling Rolling: +2 for physical assistance         General bed mobility comments: Pt required +2 assistance for bed rolling to the both sides for pericare  and bedding change.  Transfers                 General transfer comment: deferred due to patient refusal  Ambulation/Gait             General Gait Details: non ambulatory at baseline   Stairs             Wheelchair Mobility    Modified Rankin (Stroke Patients Only)       Balance Overall balance assessment: Needs assistance;History of Falls     Sitting balance - Comments: unable to test due to patient refusal to sit EOB       Standing balance comment: unable to be tested due to patient refusal                            Cognition Arousal/Alertness: Awake/alert Behavior During Therapy: [facility] for tasks assessed/performed;Anxious Overall Cognitive Status: Within Functional Limits for tasks assessed                                 General Comments: Patient fearful of movement of surgical limb, very hesitant and declines most movement.      Exercises General Exercises - Lower Extremity Ankle Circles/Pumps: AROM;Strengthening;Both;10 reps;Supine Quad Sets: AROM;Strengthening;Both;10 reps;Supine Gluteal Sets: AROM;Strengthening;Both;10 reps;Supine Heel Slides: AROM;Strengthening;Right;10 reps;Supine Hip ABduction/ADduction: AROM;Strengthening;Right;10 reps;Supine Straight Leg Raises: AROM;Strengthening;Right;10 reps;Supine Other Exercises Other Exercises: Pt required +2 maxA for rolling in supine position necessary for pericare and bedding change. Other  Exercises: Pt educated on importance of performing supine exercises in order to transfer to seated EOB.    General Comments        Pertinent Vitals/Pain Pain Assessment: 0-10 Pain Score: 10-Worst pain ever Pain Location: post surgical hip Pain Descriptors / Indicators: Aching Pain Intervention(s): Limited activity within patient's tolerance;Monitored during session;Premedicated before session;Repositioned    Home Living                      Prior Function             PT Goals (current goals can now be found in the care plan section) Acute Rehab PT Goals Patient Stated Goal: to have less pain and get a powerchair/scooter PT Goal Formulation: With patient Time For Goal Achievement: 08/24/20 Potential to Achieve Goals: Fair Progress towards PT goals: Progressing toward goals    Frequency    7X/week      PT Plan Current plan remains appropriate    Co-evaluation              AM-PAC PT "6 Clicks" Mobility   Outcome Measure  Help needed turning from your back to your side while in a flat bed without using bedrails?: A Lot Help needed moving from lying on your back to sitting on the side of a flat bed without using bedrails?: Total Help needed moving to and from a bed to a chair (including a wheelchair)?: Total Help needed standing up from a chair using your arms (e.g., wheelchair or bedside chair)?: Total Help needed to walk in hospital room?: Total Help needed climbing 3-5 steps with a railing? : Total 6 Click Score: 7    End of Session   Activity Tolerance: Other (comment) (patient fear of movement and refusal to attempt mobility) Patient left: in bed;with call bell/phone within reach;with bed alarm set;with nursing/sitter in room Nurse Communication: Mobility status;Weight bearing status PT Visit Diagnosis: Unsteadiness on feet (R26.81);Muscle weakness (generalized) (M62.81);History of falling (Z91.81);Other abnormalities of gait and mobility (R26.89);Pain Pain - Right/Left: Left Pain - part of body: Hip     Time: 5809-9833 PT Time Calculation (min) (ACUTE ONLY): 26 min  Charges:  $Therapeutic Exercise: 23-37 mins                     Gwenlyn Saran, PT, DPT 08/11/20, 12:28 PM

## 2020-08-11 NOTE — Progress Notes (Signed)
Ch visited with Pt as part of routine rounding. Pt looked like she was struggling to breathe and had dropped some food on her clothes. Ch introduced self, and Pt said "could you say a prayer for me? I don't feel too good" Ch prayed with her. Pt was thankful. Pt expressed not having walked for three years, and how tough it has been. Ch comforted Pt. Morrison let Pt know about anytime availability of chaplains to use for support during hospital stay.

## 2020-08-11 NOTE — NC FL2 (Signed)
[place] LEVEL OF CARE SCREENING TOOL     IDENTIFICATION  Patient Name: LINLEY MOSKAL Birthdate: Oct 29, 1941 Sex: female Admission Date (Current Location): 08/05/2020  [place] and [place] Number:  Engineering geologist and Address:  [facility], [address] [place], [place] [address]      Provider Number: [id number]  Attending Physician Name and Address:  Tawni Millers,*  Relative Name and Phone Number:  Parminder spouse [phone]    Current Level of Care: Hospital Recommended Level of Care:   Prior Approval Number:    Date Approved/Denied:   PASRR Number: [id number]  Discharge Plan: SNF    Current Diagnoses: Patient Active Problem List   Diagnosis Date Noted  . Chronic respiratory failure with hypoxia ([place]) 08/05/2020  . Closed left hip fracture, initial encounter ([place]) 08/05/2020  . Preoperative clearance 08/05/2020  . Osteoporotic fracture of left hip, initial encounter ([place]) 08/05/2020  . Shortness of breath   . Palliative care by specialist   . DNR (do not resuscitate)   . Acute on chronic renal insufficiency   . Adult failure to thrive   . C. difficile colitis 01/16/2020  . Perirectal abscess 06/02/2018  . Chronic diastolic heart failure ([place]) 05/12/2018  . NSTEMI (non-ST elevated myocardial infarction) ([place]) 04/15/2018  . COPD (chronic obstructive pulmonary disease) ([place]) 01/04/2018  . Pressure injury of skin 10/25/2017  . Absolute anemia 03/15/2015  . Anxiety 03/15/2015  . Arthritis 03/15/2015  . Cataract 03/15/2015  . Chronic constipation 03/15/2015  . Blood clotting disorder ([place]) 03/15/2015  . CAFL (chronic airflow limitation) ([place]) 03/15/2015  . Clinical depression 03/15/2015  . Diabetes ([place]) 03/15/2015  . H/O varicella 03/15/2015  . BP (high blood pressure) 03/15/2015  . Mixed incontinence 03/15/2015  . 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 Adiposity 03/15/2015  . Apnea,  sleep 03/15/2015  . Hypothyroidism 03/15/2015  . FOM (frequency of micturition) 03/15/2015  . 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 Candida vaginitis 03/15/2015  . Abnormal ECG 03/15/2015    Orientation RESPIRATION BLADDER Height & Weight     Self, Time, Situation, Place  Normal, O2 (2 liters) External catheter Weight: 108.9 kg Height:  5\' 5"  (165.1 cm)  BEHAVIORAL SYMPTOMS/MOOD NEUROLOGICAL BOWEL NUTRITION STATUS      Continent Diet (regular)  AMBULATORY STATUS COMMUNICATION OF NEEDS Skin   Extensive Assist Verbally Surgical wounds                       Personal Care Assistance Level of Assistance  Bathing, Dressing Bathing Assistance: Limited assistance   Dressing Assistance: Limited assistance     Functional Limitations Info             SPECIAL CARE FACTORS FREQUENCY  PT (By licensed PT)     PT Frequency: 5 times per week              Contractures Contractures Info: Not present    Additional Factors Info  Code Status, Allergies Code Status Info: full code Allergies Info: Aspirin, Codeine, Naprosyn Naproxen, Other, Tape, Valacyclovir, Latex           Current Medications (08/11/2020):  This is the current hospital active medication list Current Facility-Administered Medications  Medication Dose Route Frequency Provider Last Rate Last Admin  . alum & mag hydroxide-simeth (MAALOX/MYLANTA) 200-200-20 MG/5ML suspension 30 mL  30 mL Oral Q4H PRN Thornton Park, MD   30 mL at 08/09/20 2130  .  bisacodyl (DULCOLAX) suppository 10 mg  10 mg Rectal Daily PRN Thornton Park, MD      . budesonide (PULMICORT) nebulizer solution 0.25 mg  0.25 mg Inhalation BID Thornton Park, MD   0.25 mg at 08/11/20 3086  . buPROPion (WELLBUTRIN SR) 12 hr tablet 100 mg  100 mg Oral Daily Thornton Park, MD   100 mg at 08/10/20 5784  . carvedilol (COREG) tablet 3.125 mg  3.125 mg Oral QHS Thornton Park, MD   3.125 mg at 08/10/20 2120  . ceFAZolin  (ANCEF) IVPB 2g/100 mL premix  2 g Intravenous 30 min Pre-Op Thornton Park, MD      . Chlorhexidine Gluconate Cloth 2 % PADS 6 each  6 each Topical Daily Thornton Park, MD      . cyclobenzaprine (FLEXERIL) tablet 5 mg  5 mg Oral TID Tawni Millers, MD   5 mg at 08/10/20 2120  . docusate sodium (COLACE) capsule 100 mg  100 mg Oral BID Thornton Park, MD   100 mg at 08/10/20 2122  . enoxaparin (LOVENOX) injection 40 mg  40 mg Subcutaneous Q24H Thornton Park, MD   40 mg at 08/10/20 0831  . feeding supplement (ENSURE ENLIVE) (ENSURE ENLIVE) liquid 237 mL  237 mL Oral BID BM Thornton Park, MD   237 mL at 08/10/20 1338  . FLUoxetine (PROZAC) capsule 60 mg  60 mg Oral Daily Thornton Park, MD   60 mg at 08/10/20 6962  . fluticasone (FLONASE) 50 MCG/ACT nasal spray 1 spray  1 spray Each Nare BID Thornton Park, MD   1 spray at 08/10/20 2058  . gabapentin (NEURONTIN) capsule 200 mg  200 mg Oral TID Thornton Park, MD   200 mg at 08/10/20 2122  . guaiFENesin-dextromethorphan (ROBITUSSIN DM) 100-10 MG/5ML syrup 5 mL  5 mL Oral Q6H Thornton Park, MD   5 mL at 08/11/20 [phone]  . HYDROcodone-acetaminophen (NORCO) 7.5-325 MG per tablet 1 tablet  1 tablet Oral Q4H PRN Arrien, Jimmy Picket, MD   1 tablet at 08/10/20 1650  . insulin aspart (novoLOG) injection 0-20 Units  0-20 Units Subcutaneous Q4H Thornton Park, MD   3 Units at 08/10/20 1739  . ipratropium-albuterol (DUONEB) 0.5-2.5 (3) MG/3ML nebulizer solution 3 mL  3 mL Nebulization Q4H PRN Thornton Park, MD   3 mL at 08/05/20 1202  . ipratropium-albuterol (DUONEB) 0.5-2.5 (3) MG/3ML nebulizer solution 3 mL  3 mL Inhalation TID Thornton Park, MD   3 mL at 08/11/20 0809  . magnesium citrate solution 1 Bottle  1 Bottle Oral Once PRN Thornton Park, MD      . melatonin tablet 5 mg  5 mg Oral QHS Thornton Park, MD   5 mg at 08/10/20 2122  . menthol-cetylpyridinium (CEPACOL) lozenge 3 mg  1 lozenge Oral PRN Thornton Park, MD       Or  . phenol (CHLORASEPTIC) mouth spray 1 spray  1 spray Mouth/Throat PRN Thornton Park, MD      . methocarbamol (ROBAXIN) tablet 500 mg  500 mg Oral Q6H PRN Thornton Park, MD       Or  . methocarbamol (ROBAXIN) 500 mg in dextrose 5 % 50 mL IVPB  500 mg Intravenous Q6H PRN Thornton Park, MD      . mirtazapine (REMERON) tablet 7.5 mg  7.5 mg Oral QHS Thornton Park, MD   7.5 mg at 08/10/20 2122  . montelukast (SINGULAIR) tablet 10 mg  10 mg Oral QHS Thornton Park, MD   10 mg  at 08/10/20 2120  . multivitamin-lutein (OCUVITE-LUTEIN) capsule 1 capsule  1 capsule Oral Daily Thornton Park, MD   1 capsule at 08/10/20 [phone]  . ondansetron (ZOFRAN) tablet 4 mg  4 mg Oral Q6H PRN Thornton Park, MD       Or  . ondansetron [facility]) injection 4 mg  4 mg Intravenous Q6H PRN Thornton Park, MD      . polyethylene glycol (MIRALAX / GLYCOLAX) packet 17 g  17 g Oral Daily PRN Thornton Park, MD   17 g at 08/10/20 4193  . primidone (MYSOLINE) tablet 50 mg  50 mg Oral QHS Thornton Park, MD   50 mg at 08/10/20 2122  . senna (SENOKOT) tablet 8.6 mg  1 tablet Oral BID Thornton Park, MD   8.6 mg at 08/10/20 2121  . simvastatin (ZOCOR) tablet 20 mg  20 mg Oral Daily Thornton Park, MD   20 mg at 08/10/20 7902  . spironolactone (ALDACTONE) tablet 25 mg  25 mg Oral Daily Thornton Park, MD   25 mg at 08/10/20 4097  . torsemide (DEMADEX) tablet 40 mg  40 mg Oral BID Thornton Park, MD   40 mg at 08/10/20 1651     Discharge Medications: Please see discharge summary for a list of discharge medications.  Relevant Imaging Results:  Relevant Lab Results:   Additional Information SS#: [id number]  Su Hilt, RN

## 2020-08-12 LAB — GLUCOSE, CAPILLARY
Glucose-Capillary: 117 mg/dL — ABNORMAL HIGH (ref 70–99)
Glucose-Capillary: 127 mg/dL — ABNORMAL HIGH (ref 70–99)
Glucose-Capillary: 155 mg/dL — ABNORMAL HIGH (ref 70–99)
Glucose-Capillary: 87 mg/dL (ref 70–99)

## 2020-08-12 LAB — CBC
HCT: 28.9 % — ABNORMAL LOW (ref 36.0–46.0)
Hemoglobin: 9.1 g/dL — ABNORMAL LOW (ref 12.0–15.0)
MCH: 31.1 pg (ref 26.0–34.0)
MCHC: 31.5 g/dL (ref 30.0–36.0)
MCV: 98.6 fL (ref 80.0–100.0)
Platelets: 163 10*3/uL (ref 150–400)
RBC: 2.93 MIL/uL — ABNORMAL LOW (ref 3.87–5.11)
RDW: 16.1 % — ABNORMAL HIGH (ref 11.5–15.5)
WBC: 12 10*3/uL — ABNORMAL HIGH (ref 4.0–10.5)
nRBC: 0.3 % — ABNORMAL HIGH (ref 0.0–0.2)

## 2020-08-12 LAB — SARS CORONAVIRUS 2 BY RT PCR (HOSPITAL ORDER, PERFORMED IN ~~LOC~~ HOSPITAL LAB): SARS Coronavirus 2: NEGATIVE

## 2020-08-12 MED ORDER — IPRATROPIUM-ALBUTEROL 0.5-2.5 (3) MG/3ML IN SOLN: 3.0000 mL | Freq: Three times a day (TID) | RESPIRATORY_TRACT | 0 refills | Status: DC

## 2020-08-12 MED ORDER — HYDROCODONE-ACETAMINOPHEN 7.5-325 MG PO TABS
1.0000 | ORAL_TABLET | Freq: Four times a day (QID) | ORAL | 0 refills | Status: AC | PRN
Start: 2020-08-12 — End: ?

## 2020-08-12 MED ORDER — ENSURE ENLIVE PO LIQD: 237.0000 mL | Freq: Two times a day (BID) | ORAL | 0 refills | Status: AC

## 2020-08-12 MED ORDER — ALPRAZOLAM 0.25 MG PO TABS: 0.2500 mg | ORAL_TABLET | Freq: Two times a day (BID) | ORAL | 0 refills | Status: AC

## 2020-08-12 NOTE — TOC Progression Note (Signed)
Transition of Care [facility]) - Progression Note    Patient Details  Name: Sara Pierce MRN: [id number] Date of Birth: 1941/10/23  Transition [facility]) CM/SW [place], RN Phone Number: 08/12/2020, 10:41 AM  Clinical Narrative:   DC packet placed on the chart for the patient to go to [facility] room 101, Nurse aware      Barriers to Discharge: Continued Medical Work up, Other (comment) (Surgery Pending)  Expected Discharge Plan and Services           Expected Discharge Date: 08/12/20                                     Social Determinants of Health (SDOH) Interventions    Readmission Risk Interventions Readmission Risk Prevention Plan 01/17/2020  Transportation Screening Complete  PCP or Specialist Appt within 3-5 Days Not Complete  Not Complete comments SNF resident  Social Work Consult for [place] Planning/Counseling Complete  Palliative Care Screening Complete  Medication Review (RN Care Manager) Referral to Pharmacy  Some recent data might be hidden

## 2020-08-12 NOTE — Discharge Summary (Signed)
Physician Discharge Summary  Sara Pierce JXB:[id number] DOB: 1941-01-09 DOA: 08/05/2020  PCP: Marygrace Drought, MD  Admit date: 08/05/2020 Discharge date: 08/12/2020  Admitted From: snf  Disposition:  snf   Recommendations for Outpatient Follow-up and new medication changes:  1. Follow up with Dr. Guadelupe Sabin in 7 days.  2. Please follow up on left hip surgical wound.  3. Continue on clopidogrel, holding on enoxaparin due to left hip surgical wound bleeding.  4. Resume Cpap at night.   Home Health: na  Equipment/Devices: na    Discharge Condition: stable  CODE STATUS: full  Diet recommendation: hearth healthy  Mech Soft consistency diet (cut meats, moist foods)/ aspiration precautions.   Brief/Interim Summary: Patient admitted to the hospitalwith theworking diagnosis of acute left hip fracturein the setting of COPD exacerbation.  79 year old female with past medical history for diastolic heart failure, sleep apnea, type 2 diabetes mellitus, hypertension, hypothyroidism, COPD, chronic hypoxic respiratory failure, depression, osteoporosis, obesity and nonambulatory state. Patient is a nursing home resident, while doing physical therapy she felt a pop in her left hip, further radiographic evaluation revealed left hip fracture. On her initial physical examination blood pressure 123/73, heart rate 63, respiratory rate11, temperature 98.1, oxygen saturation 99%.Her lungs had decreased breath sounds bilaterally no wheezing, rales or rhonchi, heart S1-S2, present rhythmic, abdomen soft, no lower extremity edema. SARS COVID-19 negative. Left hip films with impacted subcapital left femoral neck fracture.Chest radiograph with left rotation, increased lung markings bilaterally, no infiltrates. EKG 55 bpm, normal axis, right bundle branch block, sinus with sinus arrhythmia, no ST segment or T wave changes, poor baseline  Orthopedic surgery was delayed due to COPD exacerbation. Patient  with  significant wheezing and dyspnea. .  Suspect patient has significant decreased pulmonary function due to chronic COPD.  On 08/09/20 patient underwent cannulated screw fixation for the left femoral neck to the left hip fracture with no major complications.  Patient noted to have bleeding from surgical site, controlled with compression. Enoxaparin was discontinued. Her dyspnea continue to improved and now is back to baseline. Tolerated well Cpap at night.  1.  Close left hip fracture status post femoral neck screw fixation.  Patient was admitted to the medical floor, she is known to be nonambulatory since 2018 after a fall and left elbow/spine injury. Her pulmonary function was optimized before any surgical intervention.  On August 28 she underwent orthopedic procedure with no major complications. Her pain was controlled with acetaminophen, gabapentin, cyclobenzaprine and as needed hydrocodone. She was noted to have bleeding at the surgical site, enoxaparin was discontinued and bleeding was controlled with compression.  For now patient will continue clopidogrel.  2.  COPD exacerbation, acute on chronic hypoxic respiratory failure.  Patient received supplemental oxygen per nasal cannula, aggressive bronchodilator, inhaled and systemic corticosteroids.  Chest physical therapy, and airway clearing techniques with flutter valve sent spirometer. Her pulmonary function improved, she was able to undergo orthopedic procedure.  At discharge patient will resume taking prednisone 10 mg daily. Patient was evaluated by speech therapy, recommendations for soft diet with aspiration precautions.  3.  Chronic diastolic heart failure.  No signs of acute exacerbation, patient continue torsemide and spironolactone.  Follow-up kidney function as an outpatient.  4.  Controlled type 2 diabetes mellitus, hemoglobin A1c 5.4, dyslipidemia patient received insulin sliding scale for glucose coverage monitoring with good  toleration. Continue statin therapy.  5.  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  Kidney function remained stable, potassium was corrected.  Discharge potassium 4.0, serum creatinine 0.81.  6.  Hypertension.  Continue carvedilol.  7.  Depression.  Continue bupropion, fluoxetine, mirtazapine and primidone.  8.  Obesity class II-III.  Calculated BMI 39.9.  Patient tolerated well with CPAP at night.   9. Iron deficiency anemia. Continue with iron supplementation.     Discharge Diagnoses:  Principal Problem:   Closed left hip fracture, initial encounter [facility]) Active Problems:   Clinical depression   Diabetes (HCC)   Apnea, sleep   Hypothyroidism   COPD (chronic obstructive pulmonary disease) (HCC)   Chronic diastolic heart failure (HCC)   Chronic respiratory failure with hypoxia (HCC)   Preoperative clearance   Osteoporotic fracture of left hip, initial encounter [facility])    Discharge Instructions   Allergies as of 08/12/2020      Reactions   Aspirin Swelling   Codeine Itching   Naprosyn [naproxen] Other (See Comments)   Per MAR pt allergic   Other Other (See Comments)   Elastic Bandages/ Supports Per MAR   Tape Hives   Valacyclovir Other (See Comments)   Reaction: unknown   Latex Rash      Medication List    STOP taking these medications   Combivent Respimat 20-100 MCG/ACT Aers respimat Generic drug: Ipratropium-Albuterol Replaced by: ipratropium-albuterol 0.5-2.5 (3) MG/3ML Soln     TAKE these medications   acetaminophen 500 MG tablet Commonly known as: TYLENOL Take 500 mg by mouth every 6 (six) hours as needed for mild pain.   albuterol 108 (90 Base) MCG/ACT inhaler Commonly known as: VENTOLIN HFA Inhale 2 puffs into the lungs every 6 (six) hours as needed for wheezing or shortness of breath.   ALPRAZolam 0.25 MG tablet Commonly known as: XANAX Take 1 tablet (0.25 mg total) by mouth 2 (two) times daily.   Anoro Ellipta 62.5-25 MCG/INH  Aepb Generic drug: umeclidinium-vilanterol Inhale 1 puff into the lungs daily.   ascorbic acid 500 MG tablet Commonly known as: VITAMIN C Take 500 mg by mouth daily.   B-complex with vitamin C tablet Take 1 tablet by mouth daily.   budesonide 180 MCG/ACT inhaler Commonly known as: PULMICORT Inhale 1 puff into the lungs 2 (two) times daily.   buPROPion 100 MG 12 hr tablet Commonly known as: WELLBUTRIN SR Take 100 mg by mouth daily.   carvedilol 3.125 MG tablet Commonly known as: COREG Take 3.125 mg by mouth at bedtime.   clopidogrel 75 MG tablet Commonly known as: PLAVIX Take 75 mg by mouth daily.   dextromethorphan-guaiFENesin 30-600 MG 12hr tablet Commonly known as: MUCINEX DM Take 1 tablet by mouth 2 (two) times daily.   feeding supplement (ENSURE ENLIVE) Liqd Take 237 mLs by mouth 2 (two) times daily between meals.   feeding supplement (PRO-STAT SUGAR FREE 64) Liqd Take 30 mLs by mouth daily.   ferrous sulfate 325 (65 FE) MG EC tablet Take 325 mg by mouth every other day.   FLUoxetine 20 MG capsule Commonly known as: PROZAC Take 60 mg by mouth daily.   fluticasone 27.5 MCG/SPRAY nasal spray Commonly known as: VERAMYST Place 2 sprays into the nose daily.   gabapentin 100 MG capsule Commonly known as: NEURONTIN Take 2 capsules (200 mg total) by mouth 3 (three) times daily.   HYDROcodone-acetaminophen 7.5-325 MG tablet Commonly known as: NORCO Take 1 tablet by mouth every 6 (six) hours as needed for severe pain.   hydrOXYzine 25 MG tablet Commonly known as: ATARAX/VISTARIL Take 25 mg by mouth 2 (two) times  daily.   hydrOXYzine 25 MG tablet Commonly known as: ATARAX/VISTARIL Take 25 mg by mouth 4 (four) times daily as needed for anxiety.   ipratropium-albuterol 0.5-2.5 (3) MG/3ML Soln Commonly known as: DUONEB Inhale 3 mLs into the lungs 3 (three) times daily. Replaces: Combivent Respimat 20-100 MCG/ACT Aers respimat   magnesium oxide 400 MG  tablet Commonly known as: MAG-OX Take 400 mg by mouth daily.   melatonin 3 MG Tabs tablet Take 3 mg by mouth at bedtime.   mirtazapine 7.5 MG tablet Commonly known as: REMERON Take 7.5 mg by mouth at bedtime.   montelukast 10 MG tablet Commonly known as: SINGULAIR Take 10 mg by mouth at bedtime.   multivitamin with minerals Tabs tablet Take 1 tablet by mouth daily.   ondansetron 4 MG tablet Commonly known as: ZOFRAN Take 1 tablet (4 mg total) by mouth every 6 (six) hours as needed for nausea.   polyethylene glycol 17 g packet Commonly known as: MIRALAX / GLYCOLAX Take 17 g by mouth daily as needed for mild constipation.   potassium chloride SA 20 MEQ tablet Commonly known as: KLOR-CON Take 20 mEq by mouth daily.   predniSONE 10 MG tablet Commonly known as: DELTASONE Take 10 mg by mouth every evening.   primidone 50 MG tablet Commonly known as: MYSOLINE Take 50 mg by mouth at bedtime.   saccharomyces boulardii 250 MG capsule Commonly known as: FLORASTOR Take 250 mg by mouth daily.   simvastatin 20 MG tablet Commonly known as: ZOCOR Take 20 mg by mouth daily.   spironolactone 25 MG tablet Commonly known as: ALDACTONE Take 25 mg by mouth daily.   torsemide 20 MG tablet Commonly known as: DEMADEX Take 40 mg by mouth 2 (two) times daily.   vitamin B-12 1000 MCG tablet Commonly known as: CYANOCOBALAMIN Take 1,000 mcg by mouth daily.            Discharge Care Instructions  (From admission, onward)         Start     Ordered   08/12/20 0000  Discharge wound care:       Comments: Post op wound care.   08/12/20 1011          Contact information for after-discharge care    Destination    [facility] Preferred SNF .   Service: Skilled Nursing Contact information: [place] [phone]                 Allergies  Allergen Reactions  . Aspirin Swelling  . Codeine Itching  . Naprosyn  [Naproxen] Other (See Comments)    Per MAR pt allergic  . Other Other (See Comments)    Elastic Bandages/ Supports Per MAR  . Tape Hives  . Valacyclovir Other (See Comments)    Reaction: unknown  . Latex Rash    Consultations:  orthopedics   Procedures/Studies: DG Chest 1 View  Result Date: 08/05/2020 CLINICAL DATA:  Cough EXAM: CHEST  1 VIEW COMPARISON:  05/20/2020 FINDINGS: Lung volumes are small, however, pulmonary insufflation remain stable since prior examination. Left basilar scarring is unchanged. No superimposed confluent pulmonary infiltrate. No pneumothorax or pleural effusion. Superior mediastinal widening is noted, likely accentuated by supine positioning and poor pulmonary insufflation. Mild cardiomegaly appears stable. Pulmonary vascularity is normal. No acute bone abnormality. 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 IMPRESSION: 1. No acute cardiopulmonary disease. 2. Superior mediastinal widening, likely accentuated by supine positioning and poor pulmonary  insufflation. This may be better assessed with a standard two view chest radiograph or CT imaging. Electronically Signed   By: Fidela Salisbury MD   On: 08/05/2020 01:36   DG Hip Port Unilat With Pelvis 1V Left  Result Date: 08/09/2020 CLINICAL DATA:  Post LEFT hip pinning EXAM: DG HIP (WITH OR WITHOUT PELVIS) 1V PORT LEFT COMPARISON:  Portable exam 1105 hours compared to earlier intraoperative images of 08/09/2020 FINDINGS: Four cannulated screws present at the proximal LEFT femur post pinning of a LEFT femoral neck fracture. No dislocation. Marked osseous demineralization. Advanced degenerative changes of the RIGHT hip joint. Atherosclerotic calcifications of the iliac arteries. IMPRESSION: Post pinning of LEFT femoral neck fracture. Electronically Signed   By: Lavonia Dana M.D.   On: 08/09/2020 11:54   DG HIP OPERATIVE UNILAT W OR W/O PELVIS LEFT  Result Date: 08/09/2020 CLINICAL DATA:  Left cannulated hip  pinning. EXAM: OPERATIVE LEFT HIP (WITH PELVIS IF PERFORMED) TECHNIQUE: Fluoroscopic spot image(s) were submitted for interpretation post-operatively. COMPARISON:  Preoperative radiograph 08/05/2020 FINDINGS: Four fluoroscopic spot views obtained in the operating room during multi screw fixation of left proximal femur fracture. Total fluoroscopy time 1 minutes 18 seconds. IMPRESSION: Procedural fluoroscopy during pinning of left hip fracture. Electronically Signed   By: Keith Rake M.D.   On: 08/09/2020 15:50   DG Hip Unilat W or Wo Pelvis 2-3 Views Left  Result Date: 08/05/2020 CLINICAL DATA:  Left hip pain EXAM: DG HIP (WITH OR WITHOUT PELVIS) 2-3V LEFT COMPARISON:  None. FINDINGS: Two view radiograph of the left hip demonstrates an impacted subcapital left femoral neck fracture. The femoral head is still seated within the acetabulum. There is mild superimposed left hip degenerative arthritis. The osseous structures are diffusely osteopenic, however, the pelvis and sacrum appear intact. Limited evaluation of the right hip demonstrates mild-to-moderate degenerative arthritis. IMPRESSION: Impacted subcapital left femoral neck fracture. Electronically Signed   By: Fidela Salisbury MD   On: 08/05/2020 01:34      Procedures: On 08/09/20 patient underwent cannulated screw fixation for the left femoral neck to the left hip fracture.   Subjective: Patient with dyspnea back to baseline, her left hip pain is controlled, no nausea or vomiting, no chest pain.   Discharge Exam: Vitals:   08/12/20 0814 08/12/20 0834  BP:  116/85  Pulse: 65 95  Resp: 16 17  Temp:  98.4 F (36.9 C)  SpO2: 98% 98%   Vitals:   08/11/20 2105 08/11/20 2353 08/12/20 0814 08/12/20 0834  BP:  (!) 96/58  116/85  Pulse:  68 65 95  Resp:  18 16 17   Temp:  98 F (36.7 C)  98.4 F (36.9 C)  TempSrc:  Oral  Oral  SpO2: 100% 99% 98% 98%  Weight:      Height:        General: Not in pain or dyspnea. Deconditioned   Neurology: Awake and alert, non focal  E ENT: no pallor, no icterus, oral mucosa moist Cardiovascular: No JVD. S1-S2 present, rhythmic, no gallops, rubs, or murmurs. No lower extremity edema. Pulmonary: positive breath sounds bilaterally, decreased breath sounds, occasional wheezing, with scattered rhonchi and rales. Gastrointestinal. Abdomen protuberant, soft and non tender Skin. No rashes Musculoskeletal: no joint deformities   The results of significant diagnostics from this hospitalization (including imaging, microbiology, ancillary and laboratory) are listed below for reference.     Microbiology: Recent Results (from the past 240 hour(s))  SARS Coronavirus 2 by RT PCR (hospital order, performed  in [place] lab) Nasopharyngeal Nasopharyngeal Swab     Status: None   Collection Time: 08/05/20 12:54 AM   Specimen: Nasopharyngeal Swab  Result Value Ref Range Status   SARS Coronavirus 2 NEGATIVE NEGATIVE Final    Comment: (NOTE) SARS-CoV-2 target nucleic acids are NOT DETECTED.  The SARS-CoV-2 RNA is generally detectable in upper and lower respiratory specimens during the acute phase of infection. The lowest concentration of SARS-CoV-2 viral copies this assay can detect is 250 copies / mL. A negative result does not preclude SARS-CoV-2 infection and should not be used as the sole basis for treatment or other patient management decisions.  A negative result may occur with improper specimen collection / handling, submission of specimen other than nasopharyngeal swab, presence of viral mutation(s) within the areas targeted by this assay, and inadequate number of viral copies (<250 copies / mL). A negative result must be combined with clinical observations, patient history, and epidemiological information.  Fact Sheet for Patients:   StrictlyIdeas.no  Fact Sheet for Healthcare Providers: BankingDealers.co.za  This test is  not yet approved or  cleared by the [place] FDA and has been authorized for detection and/or diagnosis of SARS-CoV-2 by FDA under an Emergency Use Authorization (EUA).  This EUA will remain in effect (meaning this test can be used) for the duration of the COVID-19 declaration under Section 564(b)(1) of the Act, 21 U.S.C. section 360bbb-3(b)(1), unless the authorization is terminated or revoked sooner.  Performed at [facility], [address] [place], [place] [address]      Labs: BNP (last 3 results) Recent Labs    01/17/20 0144 08/05/20 0054  BNP 259.7* 938.1*   Basic Metabolic Panel: Recent Labs  Lab 08/06/20 0549 08/08/20 0643 08/09/20 0504 08/10/20 0721 08/11/20 0500  NA 141 138 138 136 137  K 3.6 4.5 4.1 3.8 4.0  CL 98 93* 90* 88* 90*  CO2 34* 35* 38* 35* 37*  GLUCOSE 102* 127* 144* 87 103*  BUN 29* 32* 35* 26* 28*  CREATININE 0.78 0.74 0.79 0.77 0.81  CALCIUM 9.2 9.4 9.3 8.6* 8.7*   Liver Function Tests: No results for input(s): AST, ALT, ALKPHOS, BILITOT, PROT, ALBUMIN in the last 168 hours. No results for input(s): LIPASE, AMYLASE in the last 168 hours. No results for input(s): AMMONIA in the last 168 hours. CBC: Recent Labs  Lab 08/06/20 0549 08/09/20 0504 08/10/20 0721 08/11/20 0500 08/12/20 0219  WBC 11.3* 9.2 10.3 11.9* 12.0*  NEUTROABS  --  6.7  --   --   --   HGB 12.0 11.3* 9.8* 9.9* 9.1*  HCT 36.2 35.7* 31.3* 31.3* 28.9*  MCV 96.3 97.3 98.7 99.4 98.6  PLT 224 211 184 178 163   Cardiac Enzymes: No results for input(s): CKTOTAL, CKMB, CKMBINDEX, TROPONINI in the last 168 hours. BNP: Invalid input(s): POCBNP CBG: Recent Labs  Lab 08/11/20 1620 08/11/20 2135 08/12/20 0021 08/12/20 0437 08/12/20 0836  GLUCAP 97 122* 117* 87 127*   D-Dimer No results for input(s): DDIMER in the last 72 hours. Hgb A1c No results for input(s): HGBA1C in the last 72 hours. Lipid Profile No results for input(s): CHOL, HDL, LDLCALC,  TRIG, CHOLHDL, LDLDIRECT in the last 72 hours. Thyroid function studies No results for input(s): TSH, T4TOTAL, T3FREE, THYROIDAB in the last 72 hours.  Invalid input(s): FREET3 Anemia work up No results for input(s): VITAMINB12, FOLATE, FERRITIN, TIBC, IRON, RETICCTPCT in the last 72 hours. Urinalysis    Component Value Date/Time  COLORURINE AMBER (A) 01/16/2020 1723   APPEARANCEUR CLOUDY (A) 01/16/2020 1723   APPEARANCEUR Clear 05/19/2014 2245   LABSPEC 1.018 01/16/2020 1723   LABSPEC 1.008 05/19/2014 2245   PHURINE 5.0 01/16/2020 1723   GLUCOSEU NEGATIVE 01/16/2020 1723   GLUCOSEU Negative 05/19/2014 2245   HGBUR NEGATIVE 01/16/2020 1723   BILIRUBINUR SMALL (A) 01/16/2020 1723   BILIRUBINUR Negative 05/19/2014 2245   KETONESUR NEGATIVE 01/16/2020 1723   PROTEINUR 30 (A) 01/16/2020 1723   UROBILINOGEN 1.0 03/05/2011 1312   NITRITE NEGATIVE 01/16/2020 1723   LEUKOCYTESUR MODERATE (A) 01/16/2020 1723   LEUKOCYTESUR Negative 05/19/2014 2245   Sepsis Labs Invalid input(s): PROCALCITONIN,  WBC,  LACTICIDVEN Microbiology Recent Results (from the past 240 hour(s))  SARS Coronavirus 2 by RT PCR (hospital order, performed in [place] hospital lab) Nasopharyngeal Nasopharyngeal Swab     Status: None   Collection Time: 08/05/20 12:54 AM   Specimen: Nasopharyngeal Swab  Result Value Ref Range Status   SARS Coronavirus 2 NEGATIVE NEGATIVE Final    Comment: (NOTE) SARS-CoV-2 target nucleic acids are NOT DETECTED.  The SARS-CoV-2 RNA is generally detectable in upper and lower respiratory specimens during the acute phase of infection. The lowest concentration of SARS-CoV-2 viral copies this assay can detect is 250 copies / mL. A negative result does not preclude SARS-CoV-2 infection and should not be used as the sole basis for treatment or other patient management decisions.  A negative result may occur with improper specimen collection / handling, submission of specimen  other than nasopharyngeal swab, presence of viral mutation(s) within the areas targeted by this assay, and inadequate number of viral copies (<250 copies / mL). A negative result must be combined with clinical observations, patient history, and epidemiological information.  Fact Sheet for Patients:   StrictlyIdeas.no  Fact Sheet for Healthcare Providers: BankingDealers.co.za  This test is not yet approved or  cleared by the [place] FDA and has been authorized for detection and/or diagnosis of SARS-CoV-2 by FDA under an Emergency Use Authorization (EUA).  This EUA will remain in effect (meaning this test can be used) for the duration of the COVID-19 declaration under Section 564(b)(1) of the Act, 21 U.S.C. section 360bbb-3(b)(1), unless the authorization is terminated or revoked sooner.  Performed at [facility], [address], [place], [place] [address]      Time coordinating discharge: 45 minutes  SIGNED:   Tawni Millers, MD  [facility] 08/12/2020, 9:27 AM

## 2020-08-12 NOTE — Progress Notes (Signed)
Called report to [place] at [facility]. Answered all questions. CM called EMS for transport.

## 2020-08-12 NOTE — Evaluation (Signed)
Clinical/Bedside Swallow Evaluation Patient Details  Name: Sara Pierce MRN: [id number] Date of Birth: 05-12-1941  Today's Date: 08/12/2020 Time: SLP Start Time (ACUTE ONLY): 7989 SLP Stop Time (ACUTE ONLY): 0935 SLP Time Calculation (min) (ACUTE ONLY): 60 min  Past Medical History:  Past Medical History:  Diagnosis Date  . Arthritis   . Cardiomyopathy ([place])   . Cataract   . Cerebral hemorrhage ([place])   . CHF (congestive heart failure) ([place])   . Chronic kidney disease   . Clotting disorder ([place])   . COPD (chronic obstructive pulmonary disease) ([place])   . Depression   . Diabetes mellitus without complication ([place])   . Hypertension   . Mixed incontinence   . Obesity   . Sleep apnea   . Thyroid disease   . Urinary frequency   . Vaginal atrophy   . Varicose veins    Past Surgical History:  Past Surgical History:  Procedure Laterality Date  . APPENDECTOMY    . Cardiac Catherization    . COLONOSCOPY WITH PROPOFOL N/A 04/28/2016   Procedure: COLONOSCOPY WITH PROPOFOL;  Surgeon: Manya Silvas, MD;  Location: [facility] ENDOSCOPY;  Service: Endoscopy;  Laterality: N/A;  . COLONOSCOPY WITH PROPOFOL N/A 04/29/2016   Procedure: COLONOSCOPY WITH PROPOFOL;  Surgeon: Manya Silvas, MD;  Location: [facility] ENDOSCOPY;  Service: Endoscopy;  Laterality: N/A;  . HIP PINNING,CANNULATED Left 08/09/2020   Procedure: CANNULATED HIP PINNING;  Surgeon: Thornton Park, MD;  Location: [facility] ORS;  Service: Orthopedics;  Laterality: Left;  . JOINT REPLACEMENT    . STRABISMUS SURGERY    . TONSILLECTOMY    . TUBAL LIGATION     HPI:  Pt is a 79 y.o. female with medical history significant for Obesity, diastolic heart failure, sleep apnea, DM, HTN, hypothyroidism, COPD with chronic respiratory failure on home O2 at 3 L, depression, osteoporosis, and nonambulant at baseline who was sent from nursing home to the hospital for evaluation of left hip fracture.  Patient was transferring with PT when she felt a pop in  her left hip and x-rays at the nursing home revealed a left hip fracture.  Pt is POD s/p percutaneous fixation for left femoral neck hip fracture.  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    Assessment / Plan / Recommendation Clinical Impression  Pt appears to present w/ grossly functional oropharyngeal phase swallow but is SIGNIFICANTLY impacted her declined Pulmonary status and challenges in upright positioning when in bed secondary to weakness and obesity. Pt has Baseline chronic diastolic CHF and COPD, sedentary per report. Pt is followed by Palliative Care at [facility] for symptom management and GOC. Any such Pulmonary decline and sedentary status' can increase risk for aspiration during oral intake. Pt's risk for aspiration appears reduced when following general aspiration precautions including full, upright positioning w/ head forward and when supported w/ tray support and support w/ self-feeding. During po trials, pt consumed all consistencies w/ no immediate, overt coughing or decline in vocal quality. Pt's respiratory presentation during/post trials appeared to remain adequately stable as long as pt followed instructions of rest breaks b/t trials and NO Talking during po intake/swallowing. Oral phase appeared grossly [facility] w/ timely bolus management, mastication, and control of bolus propulsion for A-P transfer for swallowing. Oral clearing achieved w/ all trial consistencies given time; pt also alternated w/ sips of coffee. OM Exam appeared [facility] w/ no unilateral weakness noted. Speech Clear. Of note, pt exhbited Belching during oral intake -- pt appears  to have a Baseline of Esophageal dysmotility/dysphagia per prior BSE in 01/2020 -- ANY Esophageal dysmotility and/or regurgitation can increase risk for aspiration of REFLUX/Backflow material thus impacting his Pulmonary status. Recommend a dysphagia level 3 w/ well-Cut meats, moistened foods; Thin liquidsvia Cup preferred to  reduce risk for aspiration when using a straw w/ baseline pulmonary deficits, positioning challenges when eating in bed. Recommend general aspiration precautions, REFLUX precautions, Pills WHOLE in Puree for safer, easier swallowing as NSG described coughing w/ swallowing pills w/ water. Education given on Pills in Puree; food consistencies and easy to eat options; general aspiration precautions. Recommend f/u w/ GI for formal assessment of Esophageal phase motility and/or any concerns of s/s of REFLUX. NSG to reconsult if any new needs arise. NSG agreed. SLP Visit Diagnosis: Dysphagia, oropharyngeal phase (R13.12)    Aspiration Risk  Mild aspiration risk;Risk for inadequate nutrition/hydration    Diet Recommendation  Dysphagia level 3 (mech soft diet w/ Meats well-cut, moistened foods); Thin liquids. General aspiration and Reflux precautions. Support at meals w/ Positioning Upright and self-feeding; REST BREAKS during meals to lessen WOB/SOB d/t exertion of tasks  Medication Administration: Whole meds with puree (for safer swallowing)    Other  Recommendations Recommended Consults: Consider GI evaluation;Consider esophageal assessment (Dietician f/u; Palliative Care following) Oral Care Recommendations: Oral care BID;Oral care before and after PO;Staff/trained caregiver to provide oral care Other Recommendations:  (n/a)   Follow up Recommendations Skilled Nursing facility      Frequency and Duration  TBD         Prognosis Prognosis for Safe Diet Advancement: Fair Barriers to Reach Goals: Time post onset;Severity of deficits (Pulmonary status)      Swallow Study   General Date of Onset: 08/05/20 HPI: Pt is a 79 y.o. female with medical history significant for Obesity, diastolic heart failure, sleep apnea, DM, HTN, hypothyroidism, COPD with chronic respiratory failure on home O2 at 3 L, depression, osteoporosis, and nonambulant at baseline who was sent from nursing home to the hospital  for evaluation of left hip fracture.  Patient was transferring with PT when she felt a pop in her left hip and x-rays at the nursing home revealed a left hip fracture.  Pt is POD s/p percutaneous fixation for left femoral neck hip fracture.  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  Type of Study: Bedside Swallow Evaluation Previous Swallow Assessment: 01/2020 Diet Prior to this Study: Dysphagia 3 (soft);Thin liquids Temperature Spikes Noted: No (wbc 12.1) Respiratory Status: Nasal cannula (3L) History of Recent Intubation: No Behavior/Cognition: Alert;Cooperative;Pleasant mood;Requires cueing;Distractible Oral Cavity Assessment: Within Functional Limits Oral Care Completed by SLP: Yes Oral Cavity - Dentition: Missing dentition Vision: Functional for self-feeding Self-Feeding Abilities: Able to feed self;Needs assist;Needs set up Patient Positioning: Upright in bed (needed complete positioning upright d/t weakness) Baseline Vocal Quality: Low vocal intensity (WOB/SOB at baseline) Volitional Cough: Strong;Congested Volitional Swallow: Able to elicit    Oral/Motor/Sensory Function Overall Oral Motor/Sensory Function: Within functional limits   Ice Chips Ice chips: Within functional limits Presentation: Spoon (fed; 2 trials)   Thin Liquid Thin Liquid: Within functional limits Presentation: Cup;Self Fed;Straw (~2-3 ozs via Cup; 4 trials via Straw) Other Comments: drank most of her coffee feeding self    Nectar Thick Nectar Thick Liquid: Not tested   Honey Thick Honey Thick Liquid: Not tested   Puree Puree: Within functional limits Presentation: Spoon (fed; 2 trials)   Solid     Solid: Impaired Presentation: Spoon;Self  Fed (assisted; 4-5 trials of omelette) Oral Phase Impairments: Impaired mastication (missing dentition) Oral Phase Functional Implications: Impaired mastication (missing dentition) Pharyngeal Phase Impairments:  (none noted)        Orinda Kenner, MS, CCC-SLP Berniece Abid 08/12/2020,12:18 PM

## 2020-08-12 NOTE — TOC Progression Note (Signed)
Transition of Care [facility]) - Progression Note    Patient Details  Name: Sara Pierce MRN: [id number] Date of Birth: [birth year]  Transition of Care [facility]) CM/SW Contact  Su Hilt, RN Phone Number: 08/12/2020, 12:04 PM  Clinical Narrative:   Called EMS for transport to [facility]      Barriers to Discharge: Continued Medical Work up, Other (comment) (Surgery Pending)  Expected Discharge Plan and Services           Expected Discharge Date: 08/12/20                                     Social Determinants of Health (SDOH) Interventions    Readmission Risk Interventions Readmission Risk Prevention Plan 01/17/2020  Transportation Screening Complete  PCP or Specialist Appt within 3-5 Days Not Complete  Not Complete comments SNF resident  Social Work Consult for [place] Planning/Counseling Complete  Palliative Care Screening Complete  Medication Review (RN Care Manager) Referral to Pharmacy  Some recent data might be hidden

## 2020-08-12 NOTE — Progress Notes (Signed)
Subjective:  POD #3 s/p status post percutaneous fixation for left femoral neck hip fracture.   Patient reports left hip pain as mild to moderate.  Patient states she is having spasms in the left hip.  Objective:   VITALS:   Vitals:   08/11/20 2105 08/11/20 2353 08/12/20 0814 08/12/20 0834  BP:  (!) 96/58  116/85  Pulse:  68 65 95  Resp:  18 16 17   Temp:  98 F (36.7 C)  98.4 F (36.9 C)  TempSrc:  Oral  Oral  SpO2: 100% 99% 98% 98%  Weight:      Height:        PHYSICAL EXAM: Left lower extremity: Dressing changed again today by patient's RN. Neurovascular intact Sensation intact distally Intact pulses distally Dorsiflexion/Plantar flexion intact Reinforced dressing: Scant drainage.   No cellulitis present Compartment soft  LABS  Results for orders placed or performed during the hospital encounter of 08/05/20 (from the past 24 hour(s))  Glucose, capillary     Status: None   Collection Time: 08/11/20  4:20 PM  Result Value Ref Range   Glucose-Capillary 97 70 - 99 mg/dL   Comment 1 Notify RN    Comment 2 Document in Chart   Glucose, capillary     Status: Abnormal   Collection Time: 08/11/20  9:35 PM  Result Value Ref Range   Glucose-Capillary 122 (H) 70 - 99 mg/dL   Comment 1 Notify RN   Glucose, capillary     Status: Abnormal   Collection Time: 08/12/20 12:21 AM  Result Value Ref Range   Glucose-Capillary 117 (H) 70 - 99 mg/dL   Comment 1 Notify RN   CBC     Status: Abnormal   Collection Time: 08/12/20  2:19 AM  Result Value Ref Range   WBC 12.0 (H) 4.0 - 10.5 K/uL   RBC 2.93 (L) 3.87 - 5.11 MIL/uL   Hemoglobin 9.1 (L) 12.0 - 15.0 g/dL   HCT 28.9 (L) 36 - 46 %   MCV 98.6 80.0 - 100.0 fL   MCH 31.1 26.0 - 34.0 pg   MCHC 31.5 30.0 - 36.0 g/dL   RDW 16.1 (H) 11.5 - 15.5 %   Platelets 163 150 - 400 K/uL   nRBC 0.3 (H) 0.0 - 0.2 %  Glucose, capillary     Status: None   Collection Time: 08/12/20  4:37 AM  Result Value Ref Range   Glucose-Capillary 87 70 -  99 mg/dL   Comment 1 Notify RN   Glucose, capillary     Status: Abnormal   Collection Time: 08/12/20  8:36 AM  Result Value Ref Range   Glucose-Capillary 127 (H) 70 - 99 mg/dL   Comment 1 Notify RN   SARS Coronavirus 2 by RT PCR (hospital order, performed in [facility] hospital lab) Nasopharyngeal Nasopharyngeal Swab     Status: None   Collection Time: 08/12/20  9:29 AM   Specimen: Nasopharyngeal Swab  Result Value Ref Range   SARS Coronavirus 2 NEGATIVE NEGATIVE  Glucose, capillary     Status: Abnormal   Collection Time: 08/12/20 11:56 AM  Result Value Ref Range   Glucose-Capillary 155 (H) 70 - 99 mg/dL   Comment 1 Notify RN     No results found.  Assessment/Plan: 3 Days Post-Op   Principal Problem:   Closed left hip fracture, initial encounter ([place]) Active Problems:   Clinical depression   Diabetes (HCC)   Apnea, sleep   Hypothyroidism   COPD (  chronic obstructive pulmonary disease) (HCC)   Chronic diastolic heart failure (HCC)   Chronic respiratory failure with hypoxia (HCC)   Preoperative clearance   Osteoporotic fracture of left hip, initial encounter [facility])  Patient progressing from an orthopedic standpoint.  She will continue on her Plavix.  Lovenox held due to postoperative incisional bleeding.  Patient is weightbearing on the left lower extremity and is bed to chair transfer.  Patient will follow-up in my office in approximate 10 to 14 days.  Continue Robaxin for muscle spasms.  Patient being discharged to [facility] today.    Thornton Park , MD 08/12/2020, 1:40 PM

## 2020-10-01 ENCOUNTER — Other Ambulatory Visit: Payer: Self-pay

## 2020-10-01 ENCOUNTER — Ambulatory Visit
Admission: RE | Admit: 2020-10-01 | Discharge: 2020-10-01 | Disposition: A | Discharge status: Home / Self Care | Payer: Medicare Other | Source: Ambulatory Visit | Attending: Family Medicine | Admitting: Family Medicine

## 2020-10-01 DIAGNOSIS — R918 Other nonspecific abnormal finding of lung field: Secondary | ICD-10-CM | POA: Insufficient documentation

## 2020-12-13 ENCOUNTER — Emergency Department
Admission: EM | Admit: 2020-12-13 | Discharge: 2020-12-14 | Disposition: A | Discharge status: Home / Self Care | Payer: Medicare Other | Attending: Emergency Medicine | Admitting: Emergency Medicine

## 2020-12-13 ENCOUNTER — Other Ambulatory Visit: Payer: Self-pay

## 2020-12-13 ENCOUNTER — Emergency Department: Payer: Medicare Other

## 2020-12-13 DIAGNOSIS — I509 Heart failure, unspecified: Secondary | ICD-10-CM

## 2020-12-13 DIAGNOSIS — I700 Atherosclerosis of aorta: Secondary | ICD-10-CM | POA: Insufficient documentation

## 2020-12-13 DIAGNOSIS — Z20822 Contact with and (suspected) exposure to covid-19: Secondary | ICD-10-CM | POA: Diagnosis not present

## 2020-12-13 DIAGNOSIS — N189 Chronic kidney disease, unspecified: Secondary | ICD-10-CM | POA: Insufficient documentation

## 2020-12-13 DIAGNOSIS — Z91040 Latex allergy status: Secondary | ICD-10-CM | POA: Diagnosis not present

## 2020-12-13 DIAGNOSIS — E860 Dehydration: Secondary | ICD-10-CM | POA: Diagnosis not present

## 2020-12-13 DIAGNOSIS — I289 Disease of pulmonary vessels, unspecified: Secondary | ICD-10-CM | POA: Diagnosis not present

## 2020-12-13 DIAGNOSIS — R52 Pain, unspecified: Secondary | ICD-10-CM

## 2020-12-13 DIAGNOSIS — Z79899 Other long term (current) drug therapy: Secondary | ICD-10-CM | POA: Insufficient documentation

## 2020-12-13 DIAGNOSIS — I5032 Chronic diastolic (congestive) heart failure: Secondary | ICD-10-CM | POA: Diagnosis not present

## 2020-12-13 DIAGNOSIS — I130 Hypertensive heart and chronic kidney disease with heart failure and stage 1 through stage 4 chronic kidney disease, or unspecified chronic kidney disease: Secondary | ICD-10-CM | POA: Diagnosis not present

## 2020-12-13 DIAGNOSIS — Z7951 Long term (current) use of inhaled steroids: Secondary | ICD-10-CM | POA: Diagnosis not present

## 2020-12-13 DIAGNOSIS — J449 Chronic obstructive pulmonary disease, unspecified: Secondary | ICD-10-CM | POA: Diagnosis not present

## 2020-12-13 DIAGNOSIS — I7789 Other specified disorders of arteries and arterioles: Secondary | ICD-10-CM | POA: Insufficient documentation

## 2020-12-13 DIAGNOSIS — E1122 Type 2 diabetes mellitus with diabetic chronic kidney disease: Secondary | ICD-10-CM | POA: Insufficient documentation

## 2020-12-13 DIAGNOSIS — R0602 Shortness of breath: Secondary | ICD-10-CM | POA: Diagnosis present

## 2020-12-13 DIAGNOSIS — Z87891 Personal history of nicotine dependence: Secondary | ICD-10-CM | POA: Insufficient documentation

## 2020-12-13 DIAGNOSIS — R140 Abdominal distension (gaseous): Secondary | ICD-10-CM | POA: Insufficient documentation

## 2020-12-13 DIAGNOSIS — E039 Hypothyroidism, unspecified: Secondary | ICD-10-CM | POA: Diagnosis not present

## 2020-12-13 LAB — URINALYSIS, COMPLETE (UACMP) WITH MICROSCOPIC
Bacteria, UA: NONE SEEN
Bilirubin Urine: NEGATIVE
Glucose, UA: NEGATIVE mg/dL
Hgb urine dipstick: NEGATIVE
Ketones, ur: NEGATIVE mg/dL
Leukocytes,Ua: NEGATIVE
Nitrite: NEGATIVE
Protein, ur: NEGATIVE mg/dL
Specific Gravity, Urine: 1.011 (ref 1.005–1.030)
pH: 5 (ref 5.0–8.0)

## 2020-12-13 LAB — PROTIME-INR
INR: 0.9 (ref 0.8–1.2)
Prothrombin Time: 12.1 seconds (ref 11.4–15.2)

## 2020-12-13 LAB — PROCALCITONIN: Procalcitonin: <0.1 ng/mL

## 2020-12-13 LAB — COMPREHENSIVE METABOLIC PANEL
ALT: 15 U/L (ref 0–44)
AST: 19 U/L (ref 15–41)
Albumin: 3.6 g/dL (ref 3.5–5.0)
Alkaline Phosphatase: 64 U/L (ref 38–126)
Anion gap: 11 (ref 5–15)
BUN: 24 mg/dL — ABNORMAL HIGH (ref 8–23)
CO2: 31 mmol/L (ref 22–32)
Calcium: 8.3 mg/dL — ABNORMAL LOW (ref 8.9–10.3)
Chloride: 96 mmol/L — ABNORMAL LOW (ref 98–111)
Creatinine, Ser: 0.74 mg/dL (ref 0.44–1.00)
GFR, Estimated: >60 mL/min (ref >60)
Glucose, Bld: 134 mg/dL — ABNORMAL HIGH (ref 70–99)
Potassium: 3.3 mmol/L — ABNORMAL LOW (ref 3.5–5.1)
Sodium: 138 mmol/L (ref 135–145)
Total Bilirubin: 0.7 mg/dL (ref 0.3–1.2)
Total Protein: 6.1 g/dL — ABNORMAL LOW (ref 6.5–8.1)

## 2020-12-13 LAB — CBC WITH DIFFERENTIAL/PLATELET
Abs Immature Granulocytes: 0.18 10*3/uL — ABNORMAL HIGH (ref 0.00–0.07)
Basophils Absolute: 0 10*3/uL (ref 0.0–0.1)
Basophils Relative: 0 %
Eosinophils Absolute: 0.1 10*3/uL (ref 0.0–0.5)
Eosinophils Relative: 1 %
HCT: 34.7 % — ABNORMAL LOW (ref 36.0–46.0)
Hemoglobin: 11.1 g/dL — ABNORMAL LOW (ref 12.0–15.0)
Immature Granulocytes: 2 %
Lymphocytes Relative: 29 %
Lymphs Abs: 3 10*3/uL (ref 0.7–4.0)
MCH: 31.7 pg (ref 26.0–34.0)
MCHC: 32 g/dL (ref 30.0–36.0)
MCV: 99.1 fL (ref 80.0–100.0)
Monocytes Absolute: 0.7 10*3/uL (ref 0.1–1.0)
Monocytes Relative: 7 %
Neutro Abs: 6.3 10*3/uL (ref 1.7–7.7)
Neutrophils Relative %: 61 %
Platelets: 224 10*3/uL (ref 150–400)
RBC: 3.5 MIL/uL — ABNORMAL LOW (ref 3.87–5.11)
RDW: 15.6 % — ABNORMAL HIGH (ref 11.5–15.5)
WBC: 10.2 10*3/uL (ref 4.0–10.5)
nRBC: 0.6 % — ABNORMAL HIGH (ref 0.0–0.2)

## 2020-12-13 LAB — MAGNESIUM: Magnesium: 2.1 mg/dL (ref 1.7–2.4)

## 2020-12-13 LAB — RESP PANEL BY RT-PCR (FLU A&B, COVID) ARPGX2
Influenza A by PCR: NEGATIVE
Influenza B by PCR: NEGATIVE
SARS Coronavirus 2 by RT PCR: NEGATIVE

## 2020-12-13 LAB — POC SARS CORONAVIRUS 2 AG -  ED: SARS Coronavirus 2 Ag: NEGATIVE

## 2020-12-13 LAB — APTT: aPTT: 27 seconds (ref 24–36)

## 2020-12-13 LAB — LACTIC ACID, PLASMA: Lactic Acid, Venous: 1.5 mmol/L (ref 0.5–1.9)

## 2020-12-13 MED ORDER — ALBUTEROL SULFATE (2.5 MG/3ML) 0.083% IN NEBU
5.0000 mg | INHALATION_SOLUTION | Freq: Once | RESPIRATORY_TRACT | Status: AC
  Administered 2020-12-13: 5 mg via RESPIRATORY_TRACT
  Filled 2020-12-13: qty 6

## 2020-12-13 MED ORDER — IOHEXOL 300 MG/ML  SOLN
100.0000 mL | Freq: Once | INTRAMUSCULAR | Status: AC | PRN
  Administered 2020-12-13: 100 mL via INTRAVENOUS

## 2020-12-13 MED ORDER — IPRATROPIUM-ALBUTEROL 0.5-2.5 (3) MG/3ML IN SOLN
3.0000 mL | Freq: Once | RESPIRATORY_TRACT | Status: AC
  Administered 2020-12-13: 3 mL via RESPIRATORY_TRACT
  Filled 2020-12-13: qty 3

## 2020-12-13 MED ORDER — SODIUM CHLORIDE 0.9 % IV BOLUS
500.0000 mL | Freq: Once | INTRAVENOUS | Status: AC
  Administered 2020-12-13: 500 mL via INTRAVENOUS

## 2020-12-13 MED ORDER — MAGNESIUM SULFATE 2 GM/50ML IV SOLN
2.0000 g | INTRAVENOUS | Status: AC
  Administered 2020-12-13: 2 g via INTRAVENOUS
  Filled 2020-12-13: qty 50

## 2020-12-13 MED ORDER — METHYLPREDNISOLONE SODIUM SUCC 125 MG IJ SOLR
125.0000 mg | Freq: Once | INTRAMUSCULAR | Status: AC
  Administered 2020-12-13: 125 mg via INTRAVENOUS
  Filled 2020-12-13: qty 2

## 2020-12-13 NOTE — ED Notes (Signed)
Pt with contractures noted to left hand, splint in place with padding to splint.

## 2020-12-13 NOTE — ED Notes (Signed)
Pt stated concerns about not wanting to return to facility. ED provider and charge nurse notified.

## 2020-12-13 NOTE — ED Notes (Signed)
Report to [employer] and [employer]

## 2020-12-13 NOTE — ED Notes (Signed)
Patient transported to CT 

## 2020-12-13 NOTE — ED Triage Notes (Addendum)
Pt to ED via [facility] from [facility] c/o SOB and recent abdominal swelling.  Has hx CHF and COPD.  Uses 3L oxygen at home, currently on 4L by EMS. EMS states that BP has been in 74/38 range with HR in 50s. Has received fluid so far, 500 mL hanging via 22g in R forearm. Sats 98% on 4L per EMS. Audible wheezing heard when pt breathes.

## 2020-12-13 NOTE — ED Notes (Signed)
Pt cleansed of medium amount of semiformed dark brown stool.

## 2020-12-13 NOTE — ED Notes (Signed)
Pt states she needs to urinate, pure wick placed.

## 2020-12-13 NOTE — ED Notes (Signed)
Pt returned from ct

## 2020-12-13 NOTE — ED Notes (Signed)
Dr. Scotty Court notified of blood pressure 94/40.

## 2020-12-14 DIAGNOSIS — J449 Chronic obstructive pulmonary disease, unspecified: Secondary | ICD-10-CM | POA: Diagnosis not present

## 2020-12-14 LAB — POC SARS CORONAVIRUS 2 AG -  ED: SARS Coronavirus 2 Ag: NEGATIVE

## 2020-12-14 MED ORDER — BUPROPION HCL ER (SR) 100 MG PO TB12
100.0000 mg | ORAL_TABLET | Freq: Every day | ORAL | Status: DC
  Administered 2020-12-14: 100 mg via ORAL
  Filled 2020-12-14: qty 1

## 2020-12-14 MED ORDER — MIRTAZAPINE 15 MG PO TABS
7.5000 mg | ORAL_TABLET | Freq: Every day | ORAL | Status: DC
Start: 2020-12-14 — End: 2020-12-14
  Administered 2020-12-14: 7.5 mg via ORAL
  Filled 2020-12-14: qty 1

## 2020-12-14 MED ORDER — CARVEDILOL 6.25 MG PO TABS: 3.1250 mg | ORAL_TABLET | Freq: Every day | ORAL | Status: DC

## 2020-12-14 MED ORDER — ACETAMINOPHEN 500 MG PO TABS
500.0000 mg | ORAL_TABLET | Freq: Four times a day (QID) | ORAL | Status: DC | PRN
  Administered 2020-12-14: 500 mg via ORAL
  Filled 2020-12-14: qty 1

## 2020-12-14 MED ORDER — MELATONIN 5 MG PO TABS
2.5000 mg | ORAL_TABLET | Freq: Every day | ORAL | Status: DC
  Administered 2020-12-14: 2.5 mg via ORAL
  Filled 2020-12-14: qty 1

## 2020-12-14 MED ORDER — PRIMIDONE 50 MG PO TABS
50.0000 mg | ORAL_TABLET | Freq: Every day | ORAL | Status: DC
  Filled 2020-12-14 (×2): qty 1

## 2020-12-14 MED ORDER — TORSEMIDE 20 MG PO TABS: 40.0000 mg | ORAL_TABLET | Freq: Every day | ORAL | Status: DC

## 2020-12-14 MED ORDER — DM-GUAIFENESIN ER 30-600 MG PO TB12
1.0000 | ORAL_TABLET | Freq: Two times a day (BID) | ORAL | Status: DC
  Administered 2020-12-14 (×2): 1 via ORAL
  Filled 2020-12-14 (×2): qty 1

## 2020-12-14 MED ORDER — SIMVASTATIN 10 MG PO TABS
20.0000 mg | ORAL_TABLET | Freq: Every day | ORAL | Status: DC
  Administered 2020-12-14: 20 mg via ORAL
  Filled 2020-12-14: qty 2

## 2020-12-14 MED ORDER — MELATONIN 3 MG PO TABS: 3.0000 mg | ORAL_TABLET | Freq: Every day | ORAL | Status: DC

## 2020-12-14 MED ORDER — CLOPIDOGREL BISULFATE 75 MG PO TABS
75.0000 mg | ORAL_TABLET | Freq: Every day | ORAL | Status: DC
  Administered 2020-12-14: 75 mg via ORAL
  Filled 2020-12-14: qty 1

## 2020-12-14 MED ORDER — BUDESONIDE 180 MCG/ACT IN AEPB
1.0000 | INHALATION_SPRAY | Freq: Two times a day (BID) | RESPIRATORY_TRACT | Status: DC
  Filled 2020-12-14: qty 1

## 2020-12-14 MED ORDER — FLUTICASONE FUROATE 27.5 MCG/SPRAY NA SUSP: 2.0000 | Freq: Every day | NASAL | Status: DC

## 2020-12-14 MED ORDER — GABAPENTIN 100 MG PO CAPS
200.0000 mg | ORAL_CAPSULE | Freq: Three times a day (TID) | ORAL | Status: DC
  Administered 2020-12-14 (×2): 200 mg via ORAL
  Filled 2020-12-14 (×2): qty 2

## 2020-12-14 MED ORDER — ALBUTEROL SULFATE HFA 108 (90 BASE) MCG/ACT IN AERS
2.0000 | INHALATION_SPRAY | Freq: Four times a day (QID) | RESPIRATORY_TRACT | Status: DC | PRN
  Filled 2020-12-14: qty 6.7

## 2020-12-14 MED ORDER — FLUOXETINE HCL 20 MG PO CAPS
60.0000 mg | ORAL_CAPSULE | Freq: Every day | ORAL | Status: DC
  Administered 2020-12-14: 60 mg via ORAL
  Filled 2020-12-14: qty 3

## 2020-12-14 MED ORDER — ALPRAZOLAM 0.5 MG PO TABS
0.2500 mg | ORAL_TABLET | Freq: Two times a day (BID) | ORAL | Status: DC
Start: 2020-12-14 — End: 2020-12-14
  Administered 2020-12-14 (×2): 0.25 mg via ORAL
  Filled 2020-12-14 (×2): qty 1

## 2020-12-14 MED ORDER — FLUTICASONE PROPIONATE 50 MCG/ACT NA SUSP
2.0000 | Freq: Every day | NASAL | Status: DC
  Administered 2020-12-14: 2 via NASAL
  Filled 2020-12-14: qty 16

## 2020-12-14 NOTE — Progress Notes (Signed)
PT Cancellation Note  Patient Details Name: Sara Pierce MRN: [id number] DOB: 03/04/1941   Cancelled Treatment:    Reason Eval/Treat Not Completed: PT screened, no needs identified, will sign off;Other (comment). PT spoke with pt; at baseline pt has been a hoyer lift for transfers for years; [facility] assists with ADLs, pt able to roll in bed. Per pt 4-5 months ago she was working with PT on slideboard transfers that resulted in a broken hip, and has not done any transfers since then. Pt reported she does not want PT when returning to rehab. The pt reported near return to baseline level of functioning, no further acute PT needs indicated. PT to sign off. Please reconsult PT if pt status changes or acute needs are identified.   Olga Coaster PT, DPT 9:23 AM,12/14/20

## 2020-12-14 NOTE — TOC Initial Note (Signed)
Transition of Care [facility]) - Initial/Assessment Note    Patient Details  Name: Sara Pierce MRN: [id number] Date of Birth: [birth year]  Transition of Care [facility]) CM/SW Contact:    Berenice Bouton, LCSW Phone Number: 12/14/2020, 11:33 AM  Clinical Narrative:  Patient from [facility]. Plan was to return to [facility] however the patient does not desire to return to [facility].  Patient stated that "I don't want to return to [facility] because I did not like the way treated me."  She noted that "they had bad transfer and left me sitting in feces for hours."   Patient is requesting to go home with [facility] and DME.    Action taken: CSW explained to the patient that her bed at [facility] is being held. CSW confirmed that [facility] will have her bed reserve for her should she wish to return to [facility]. Patient the stated that she would like to return to her home with her husband. CSW discussed with the patient that as [facility] member along with ED attending we would have to ensure safe discharge to her home.    Discussed needs:  Home oxygen  Home Health, aid, RN, social worker, and possibly PT/OT [facility] bed     Patient Goals and CMS Choice       Expected Discharge Plan and Services                                                Prior Living Arrangements/Services                       Activities of Daily Living      Permission Sought/Granted                  Emotional Assessment              Admission diagnosis:  breathing diff Patient Active Problem List   Diagnosis Date Noted  . Chronic respiratory failure with hypoxia ([place]) 08/05/2020  . Closed left hip fracture, initial encounter ([place]) 08/05/2020  . Preoperative clearance 08/05/2020  . Osteoporotic fracture of left hip, initial encounter ([place]) 08/05/2020  . Shortness of breath   . Palliative care by specialist   . DNR (do not resuscitate)   . Acute on chronic renal insufficiency   . Adult  failure to thrive   . C. difficile colitis 01/16/2020  . Perirectal abscess 06/02/2018  . Chronic diastolic heart failure ([place]) 05/12/2018  . NSTEMI (non-ST elevated myocardial infarction) ([place]) 04/15/2018  . COPD (chronic obstructive pulmonary disease) ([place]) 01/04/2018  . Pressure injury of skin 10/25/2017  . Absolute anemia 03/15/2015  . Anxiety 03/15/2015  . Arthritis 03/15/2015  . Cataract 03/15/2015  . Chronic constipation 03/15/2015  . Blood clotting disorder ([place]) 03/15/2015  . CAFL (chronic airflow limitation) ([place]) 03/15/2015  . Clinical depression 03/15/2015  . Diabetes ([place]) 03/15/2015  . H/O varicella 03/15/2015  . BP (high blood pressure) 03/15/2015  . Mixed incontinence 03/15/2015  . 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 Adiposity 03/15/2015  . Apnea, sleep 03/15/2015  . Hypothyroidism 03/15/2015  . FOM (frequency of micturition) 03/15/2015  . 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 Candida vaginitis 03/15/2015  . Abnormal ECG 03/15/2015   PCP:  Marygrace Drought, MD Pharmacy:  No Pharmacies Listed  Social Determinants of Health (SDOH) Interventions    Readmission Risk Interventions Readmission Risk Prevention Plan 01/17/2020  Transportation Screening Complete  PCP or Specialist Appt within 3-5 Days Not Complete  Not Complete comments SNF resident  Social Work Consult for [place] Care Planning/Counseling Complete  Palliative Care Screening Complete  Medication Review (RN Care Manager) Referral to Pharmacy  Some recent data might be hidden

## 2020-12-14 NOTE — TOC Initial Note (Signed)
Transition of Care [facility]) - Initial/Assessment Note    Patient Details  Name: Sara Pierce MRN: [id number] Date of Birth: 1941/09/18  Transition of Care [facility]) CM/SW Contact:    Berenice Bouton, LCSW Phone Number: 12/14/2020, 1:12 PM  Clinical Narrative:     CSW met with patient at bedside to discussed discharge plan. Social worker explained to the patient reason for the consult. CSW explain HIPPA.    Patient is a 80 year old female who presented to [facility] ED via [facility] from [facility] with shortness of breath and recent abdominal swelling. History of CHF and COPD.  Uses 3L oxygen at home, currently on 4L by EMS.  Patient reported that she does not want to return to [facility]. Patient stated that "I don't want to return to [facility] because I did not like the way treated me."  She noted that "they had bad transfer and left me sitting in feces for hours."   Patient is requesting to go home with [facility] and DME.    Action taken: CSW explained to the patient that [facility] bed at [facility] is being [facility]. CSW confirmed that [facility] will have her bed reserve for her should she wish to return to [facility]. Patient the stated that she would like to return to her home with her husband. CSW discussed with the patient that as [facility] member along with ED attending we would have to ensure safe discharge to her home.    Discussed needs:  [place], aid, RN, social worker, and possibly PT/OT Wheelchair Bedside commode  Hospital bed   [place] spoke to [place] from [place] for DME - CSW spoke to [place]        Plan as of 1300 12/14/2020 is for patient to discharge home with home health and DME pending PT evaluation.  This plan reviewed with attending provider at 1300 hours.         Expected Discharge Plan: [place] Services Barriers to Discharge: Other (comment) (TOC waiing for [facility] and DME agency confirmation)   Patient  Goals and CMS Choice Patient states their goals for this hospitalization and ongoing recovery are:: Patient is requeseting to return home with home health and DME CMS Medicare.gov Compare Post Acute Care list provided to:: Patient Choice offered to / list presented to : [facility]  Expected Discharge Plan and Services Expected Discharge Plan: [place] In-house Referral: Clinical Social Work   Post Acute Care Choice: [place] arrangements for the past 2 months: [place] ([facility])                 DME Arranged: 3-N-1,Oxygen,Wheelchair manual [facility] Bed) DME Agency: [employer] Date DME Agency Contacted: 12/14/20 Time DME Agency Contacted: [phone] Representative spoke with at [place]: Jamal Maes [phone] [facility] Arranged: PT,OT,RN,Nurse's [place] Work [place]: [place] ([place]) Date [place]: 12/14/20 Time [place]: 1308 Representative spoke with at [place]: Jamal Maes [phone]  Prior Living Arrangements/Services Living arrangements for the past 2 months: [place] ([facility]) Lives with:: Spouse Patient language and need for interpreter reviewed:: No Do you feel safe going back to the place where you live?: Yes        Care giver support system in place?: Yes (comment) (Patient lives with her husband)   Criminal Activity/Legal Involvement Pertinent to Current Situation/Hospitalization: No - Comment as needed  Activities of  Daily Living      Permission Sought/Granted Permission sought to share information with : Facility Acupuncturist granted to share information with : Yes, Verbal Permission Granted  Share Information with NAME: Marylene Buerger, [phone]  Permission granted to share info w AGENCY: DME and [facility] agency  Permission granted to share info w Relationship: Ferdinand Lango, [phone]      Emotional Assessment Appearance:: Appears older than stated age Attitude/Demeanor/Rapport: Self-Confident,Engaged,Gracious Affect (typically observed): Accepting,Adaptable Orientation: : Oriented to Self,Oriented to Place,Oriented to  Time,Oriented to Situation Alcohol / Substance Use: Not Applicable Psych Involvement: No (comment)  Admission diagnosis:  breathing diff Patient Active Problem List   Diagnosis Date Noted  . Chronic respiratory failure with hypoxia ([place]) 08/05/2020  . Closed left hip fracture, initial encounter ([place]) 08/05/2020  . Preoperative clearance 08/05/2020  . Osteoporotic fracture of left hip, initial encounter ([place]) 08/05/2020  . Shortness of breath   . Palliative care by specialist   . DNR (do not resuscitate)   . Acute on chronic renal insufficiency   . Adult failure to thrive   . C. difficile colitis 01/16/2020  . Perirectal abscess 06/02/2018  . Chronic diastolic heart failure ([place]) 05/12/2018  . NSTEMI (non-ST elevated myocardial infarction) ([place]) 04/15/2018  . COPD (chronic obstructive pulmonary disease) ([place]) 01/04/2018  . Pressure injury of skin 10/25/2017  . Absolute anemia 03/15/2015  . Anxiety 03/15/2015  . Arthritis 03/15/2015  . Cataract 03/15/2015  . Chronic constipation 03/15/2015  . Blood clotting disorder ([place]) 03/15/2015  . CAFL (chronic airflow limitation) ([place]) 03/15/2015  . Clinical depression 03/15/2015  . Diabetes ([place]) 03/15/2015  . H/O varicella 03/15/2015  . BP (high blood pressure) 03/15/2015  . Mixed incontinence 03/15/2015  . 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 Adiposity 03/15/2015  . Apnea, sleep 03/15/2015  . Hypothyroidism 03/15/2015  . FOM (frequency of micturition) 03/15/2015  . 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 Candida vaginitis 03/15/2015  . Abnormal ECG 03/15/2015   PCP:  Marygrace Drought, MD Pharmacy:  No Pharmacies Listed    Social Determinants of Health  (SDOH) Interventions    Readmission Risk Interventions Readmission Risk Prevention Plan 01/17/2020  Transportation Screening Complete  PCP or Specialist Appt within 3-5 Days Not Complete  Not Complete comments SNF resident  Social Work Consult for [place] Planning/Counseling Complete  Palliative Care Screening Complete  Medication Review (RN Care Manager) Referral to Pharmacy  Some recent data might be hidden

## 2020-12-14 NOTE — ED Notes (Signed)
Pt up for discharge. Pt is refusing to go back to [facility]. Pt states she feels safe, but at times feels neglected and has to take to many pills. ER charge RN and ED provider notified

## 2020-12-14 NOTE — Discharge Instructions (Addendum)
Please decrease torsemide from two times a day to once a day for the next three days to allow for better hydration.  Please follow up with your doctor this week, or see the heart failure clinic.  Your lab tests and CT scan of the chest abdomen pelvis were all reassuring today.  There is no sign of infection or other acute issues.

## 2020-12-14 NOTE — ED Provider Notes (Signed)
[facility] Emergency Department Provider Note  ____________________________________________  Time seen: Approximately 12:12 AM  I have reviewed the triage vital signs and the nursing notes.   HISTORY  Chief Complaint Shortness of Breath    Level 5 Caveat: Portions of the History and Physical including HPI and review of systems are unable to be completely obtained due to patient being a poor historian   HPI Sara Pierce is a 80 y.o. female with a history of CHF, CKD, COPD sent to the ED from [facility] due to  shortness of breath.  Patient uses 3 L nasal cannula oxygen at all times.  Patient denies any acute symptoms, denies chest pain or shortness of breath.  EMS note that abdomen seems more swollen.     Past Medical History:  Diagnosis Date  . Arthritis   . Cardiomyopathy ([place])   . Cataract   . Cerebral hemorrhage ([place])   . CHF (congestive heart failure) ([place])   . Chronic kidney disease   . Clotting disorder ([place])   . COPD (chronic obstructive pulmonary disease) ([place])   . Depression   . Diabetes mellitus without complication ([place])   . Hypertension   . Mixed incontinence   . Obesity   . Sleep apnea   . Thyroid disease   . Urinary frequency   . Vaginal atrophy   . Varicose veins      Patient Active Problem List   Diagnosis Date Noted  . Chronic respiratory failure with hypoxia ([place]) 08/05/2020  . Closed left hip fracture, initial encounter ([place]) 08/05/2020  . Preoperative clearance 08/05/2020  . Osteoporotic fracture of left hip, initial encounter ([place]) 08/05/2020  . Shortness of breath   . Palliative care by specialist   . DNR (do not resuscitate)   . Acute on chronic renal insufficiency   . Adult failure to thrive   . C. difficile colitis 01/16/2020  . Perirectal abscess 06/02/2018  . Chronic diastolic heart failure ([place]) 05/12/2018  . NSTEMI (non-ST elevated myocardial infarction) ([place]) 04/15/2018  . COPD (chronic  obstructive pulmonary disease) ([place]) 01/04/2018  . Pressure injury of skin 10/25/2017  . Absolute anemia 03/15/2015  . Anxiety 03/15/2015  . Arthritis 03/15/2015  . Cataract 03/15/2015  . Chronic constipation 03/15/2015  . Blood clotting disorder ([place]) 03/15/2015  . CAFL (chronic airflow limitation) ([place]) 03/15/2015  . Clinical depression 03/15/2015  . Diabetes ([place]) 03/15/2015  . H/O varicella 03/15/2015  . BP (high blood pressure) 03/15/2015  . Mixed incontinence 03/15/2015  . 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 Adiposity 03/15/2015  . Apnea, sleep 03/15/2015  . Hypothyroidism 03/15/2015  . FOM (frequency of micturition) 03/15/2015  . 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 Candida vaginitis 03/15/2015  . Abnormal ECG 03/15/2015     Past Surgical History:  Procedure Laterality Date  . APPENDECTOMY    . Cardiac Catherization    . COLONOSCOPY WITH PROPOFOL N/A 04/28/2016   Procedure: COLONOSCOPY WITH PROPOFOL;  Surgeon: Manya Silvas, MD;  Location: [facility] ENDOSCOPY;  Service: Endoscopy;  Laterality: N/A;  . COLONOSCOPY WITH PROPOFOL N/A 04/29/2016   Procedure: COLONOSCOPY WITH PROPOFOL;  Surgeon: Manya Silvas, MD;  Location: [facility] ENDOSCOPY;  Service: Endoscopy;  Laterality: N/A;  . HIP PINNING,CANNULATED Left 08/09/2020   Procedure: CANNULATED HIP PINNING;  Surgeon: Thornton Park, MD;  Location: [facility] ORS;  Service: Orthopedics;  Laterality: Left;  . JOINT REPLACEMENT    . STRABISMUS SURGERY    .  TONSILLECTOMY    . TUBAL LIGATION       Prior to Admission medications   Medication Sig Start Date End Date Taking? Authorizing Provider  acetaminophen (TYLENOL) 500 MG tablet Take 500 mg by mouth every 6 (six) hours as needed for mild pain.     [provider]  albuterol (VENTOLIN HFA) 108 (90 Base) MCG/ACT inhaler Inhale 2 puffs into the lungs every 6 (six) hours as needed for wheezing or shortness of breath.    [provider]  ALPRAZolam Duanne Moron) 0.25 MG tablet Take 1 tablet (0.25 mg total) by mouth 2 (two) times daily. 08/12/20   Arrien, Jimmy Picket, MD  Amino Acids-Protein Hydrolys (FEEDING SUPPLEMENT, PRO-STAT SUGAR FREE 64,) LIQD Take 30 mLs by mouth daily.    [provider]  ascorbic acid (VITAMIN C) 500 MG tablet Take 500 mg by mouth daily.    [provider]  B Complex-C (B-COMPLEX WITH VITAMIN C) tablet Take 1 tablet by mouth daily.    [provider]  budesonide (PULMICORT) 180 MCG/ACT inhaler Inhale 1 puff into the lungs 2 (two) times daily.     [provider]  buPROPion (WELLBUTRIN SR) 100 MG 12 hr tablet Take 100 mg by mouth daily.    [provider]  carvedilol (COREG) 3.125 MG tablet Take 3.125 mg by mouth at bedtime.    [provider]  clopidogrel (PLAVIX) 75 MG tablet Take 75 mg by mouth daily.    [provider]  dextromethorphan-guaiFENesin (MUCINEX DM) 30-600 MG 12hr tablet Take 1 tablet by mouth 2 (two) times daily.    [provider]  ferrous sulfate 325 (65 FE) MG EC tablet Take 325 mg by mouth every other day.    [provider]  FLUoxetine (PROZAC) 20 MG capsule Take 60 mg by mouth daily.     [provider]  fluticasone (VERAMYST) 27.5 MCG/SPRAY nasal spray Place 2 sprays into the nose daily.    [provider]  gabapentin (NEURONTIN) 100 MG capsule Take 2 capsules (200 mg total) by mouth 3 (three) times daily. 04/22/18   Nicholes Mango, MD  HYDROcodone-acetaminophen (NORCO) 7.5-325 MG tablet Take 1 tablet by mouth every 6 (six) hours as needed for severe pain. 08/12/20   Arrien, Jimmy Picket, MD  hydrOXYzine (ATARAX/VISTARIL) 25 MG tablet Take 25 mg by mouth 2 (two) times daily.     [provider]  hydrOXYzine (ATARAX/VISTARIL) 25 MG tablet Take 25 mg by mouth 4 (four) times daily as needed for anxiety.    [provider]  ipratropium-albuterol (DUONEB) 0.5-2.5  (3) MG/3ML SOLN Inhale 3 mLs into the lungs 3 (three) times daily. 08/12/20 09/11/20  Arrien, Jimmy Picket, MD  magnesium oxide (MAG-OX) 400 MG tablet Take 400 mg by mouth daily.    [provider]  melatonin 3 MG TABS tablet Take 3 mg by mouth at bedtime.    [provider]  mirtazapine (REMERON) 7.5 MG tablet Take 7.5 mg by mouth at bedtime.     [provider]  montelukast (SINGULAIR) 10 MG tablet Take 10 mg by mouth at bedtime.     [provider]  Multiple Vitamin (MULTIVITAMIN WITH MINERALS) TABS tablet Take 1 tablet by mouth daily.    [provider]  ondansetron (ZOFRAN) 4 MG tablet Take 1 tablet (4 mg total) by mouth every 6 (six) hours as needed for nausea. 01/22/20   Hoyt Koch, MD  polyethylene glycol (MIRALAX / Floria Raveling) 17  g packet Take 17 g by mouth daily as needed for mild constipation.    [provider]  potassium chloride SA (KLOR-CON) 20 MEQ tablet Take 20 mEq by mouth daily.    [provider]  predniSONE (DELTASONE) 10 MG tablet Take 10 mg by mouth every evening.    [provider]  primidone (MYSOLINE) 50 MG tablet Take 50 mg by mouth at bedtime.     [provider]  saccharomyces boulardii (FLORASTOR) 250 MG capsule Take 250 mg by mouth daily.    [provider]  simvastatin (ZOCOR) 20 MG tablet Take 20 mg by mouth daily.    [provider]  spironolactone (ALDACTONE) 25 MG tablet Take 25 mg by mouth daily.    [provider]  torsemide (DEMADEX) 20 MG tablet Take 40 mg by mouth 2 (two) times daily.    [provider]  umeclidinium-vilanterol (ANORO ELLIPTA) 62.5-25 MCG/INH AEPB Inhale 1 puff into the lungs daily.    [provider]  vitamin B-12 (CYANOCOBALAMIN) 1000 MCG tablet Take 1,000 mcg by mouth daily.    [provider]     Allergies Aspirin, Codeine, Naprosyn [naproxen], Other, Tape, Valacyclovir, and Latex   Family  History  Problem Relation Age of Onset  . Heart attack Father   . Coronary artery disease Father   . Hypertension Father   . Hyperlipidemia Father   . Heart attack Mother   . Hypertension Mother   . Hyperlipidemia Mother   . Breast cancer Neg Hx     Social History Social History   Tobacco Use  . Smoking status: Former Smoker    Packs/day: 0.50    Years: 44.00    Pack years: 22.00    Types: Cigarettes  . Smokeless tobacco: Never Used  Vaping Use  . Vaping Use: Never used  Substance Use Topics  . Alcohol use: No  . Drug use: No    Review of Systems Level 5 Caveat: Portions of the History and Physical including HPI and review of systems are unable to be completely obtained due to patient being a poor historian   Constitutional:   No known fever.  ENT:   No rhinorrhea. Cardiovascular:   No chest pain or syncope. Respiratory:   No dyspnea or cough. Gastrointestinal:   Negative for abdominal pain, vomiting and diarrhea.  Musculoskeletal:   Negative for focal pain or swelling ____________________________________________   PHYSICAL EXAM:  VITAL SIGNS: ED Triage Vitals  Enc Vitals Group     BP 12/13/20 1854 (!) 102/35     Pulse Rate 12/13/20 1854 (!) 57     Resp 12/13/20 1854 13     Temp 12/13/20 1854 98.7 F (37.1 C)     Temp Source 12/13/20 1854 Oral     SpO2 12/13/20 1854 98 %     Weight 12/13/20 1853 247 lb (112 kg)     Height 12/13/20 1853 5\' 5"  (1.651 m)     Head Circumference --      [facility] Flow --      Pain Score 12/13/20 1846 3     Pain Loc --      Pain Edu? --      Excl. in GC? --     Vital signs reviewed, nursing assessments reviewed.   Constitutional:   Alert and oriented to person and place. Non-toxic appearance. Eyes:   Conjunctivae are normal. EOMI. PERRL. ENT      Head:   Normocephalic and atraumatic.  Nose:   No congestion/rhinnorhea.       Mouth/Throat:   MMM, no pharyngeal erythema. No peritonsillar mass.       Neck:   No  meningismus. Full ROM. Hematological/Lymphatic/Immunilogical:   No cervical lymphadenopathy. Cardiovascular:   RRR. Symmetric bilateral radial and DP pulses.  No murmurs. Cap refill less than 2 seconds. Respiratory:   Normal respiratory effort without tachypnea/retractions.  Mild diffuse expiratory wheezing, no focal crackles.  Symmetric air movement. Gastrointestinal:   Soft and nontender.  Mildly distended. There is no CVA tenderness.  No rebound, rigidity, or guarding.  Musculoskeletal:   Normal range of motion in all extremities. No joint effusions.  No lower extremity tenderness.  No edema. Neurologic:   Normal speech and language.  Motor grossly intact. No acute focal neurologic deficits are appreciated.  Skin:    Skin is warm, dry and intact. No rash noted.  No petechiae, purpura, or bullae.  ____________________________________________    LABS (pertinent positives/negatives) (all labs ordered are listed, but only abnormal results are displayed) Labs Reviewed  COMPREHENSIVE METABOLIC PANEL - Abnormal; Notable for the following components:      Result Value   Potassium 3.3 (*)    Chloride 96 (*)    Glucose, Bld 134 (*)    BUN 24 (*)    Calcium 8.3 (*)    Total Protein 6.1 (*)    All other components within normal limits  CBC WITH DIFFERENTIAL/PLATELET - Abnormal; Notable for the following components:   RBC 3.50 (*)    Hemoglobin 11.1 (*)    HCT 34.7 (*)    RDW 15.6 (*)    nRBC 0.6 (*)    Abs Immature Granulocytes 0.18 (*)    All other components within normal limits  URINALYSIS, COMPLETE (UACMP) WITH MICROSCOPIC - Abnormal; Notable for the following components:   Color, Urine YELLOW (*)    APPearance CLEAR (*)    All other components within normal limits  RESP PANEL BY RT-PCR (FLU A&B, COVID) ARPGX2  CULTURE, BLOOD (SINGLE)  URINE CULTURE  LACTIC ACID, PLASMA  PROTIME-INR  APTT  MAGNESIUM  PROCALCITONIN  POC SARS CORONAVIRUS 2 AG -  ED    ____________________________________________   EKG  Interpreted by me Atrial fibrillation rate of 51, normal axis and intervals.  Right bundle branch block.  No acute ischemic changes.  ____________________________________________    RADIOLOGY  CT CHEST ABDOMEN PELVIS W CONTRAST  Result Date: 12/13/2020 CLINICAL DATA:  Shortness of breath and abdominal distension EXAM: CT CHEST, ABDOMEN, AND PELVIS WITH CONTRAST TECHNIQUE: Multidetector CT imaging of the chest, abdomen and pelvis was performed following the standard protocol during bolus administration of intravenous contrast. CONTRAST:  124mL OMNIPAQUE IOHEXOL 300 MG/ML  SOLN COMPARISON:  01/16/2020 FINDINGS: CT CHEST FINDINGS Cardiovascular: Calcific aortic atherosclerosis. Enlarged main pulmonary artery measuring 4.2 cm. Heart size is normal. Mediastinum/Nodes: No mediastinal or axillary lymphadenopathy. Normal thyroid. Lungs/Pleura: Bibasilar atelectasis. Musculoskeletal: Thoracic ankylosis. CT ABDOMEN PELVIS FINDINGS Hepatobiliary: No focal liver abnormality is seen. No gallstones, gallbladder wall thickening, or biliary dilatation. Pancreas: Unremarkable. No pancreatic ductal dilatation or surrounding inflammatory changes. Spleen: Normal in size without focal abnormality. Adrenals/Urinary Tract: Adrenal glands are unremarkable. Kidneys are normal, without renal calculi, focal lesion, or hydronephrosis. Bladder is unremarkable. Stomach/Bowel: Unchanged rectosigmoid straightening with mild wall thickening and dilated, gas-filled colon proximal to this area. The colon is otherwise normal. No small bowel obstruction or inflammation. Normal appendix. Vascular/Lymphatic: Aortic atherosclerosis. No enlarged abdominal or pelvic lymph nodes. Reproductive: Calcified  uterine fibroids. Other: No abdominal wall hernia or abnormality. No abdominopelvic ascites. Musculoskeletal: Multilevel chronic lumbar vertebral body height loss. IMPRESSION: 1. Unchanged  rectosigmoid straightening with mild wall thickening and dilated, gas-filled colon proximal to this area. This may indicate mild colitis, but the degree of wall thickening is less than on 01/16/20. 2. Enlarged main pulmonary artery, suggesting pulmonary arterial hypertension. Aortic Atherosclerosis (ICD10-I70.0). Electronically Signed   By: Ulyses Jarred M.D.   On: 12/13/2020 22:24   DG Chest Port 1 View  Result Date: 12/13/2020 CLINICAL DATA:  Shortness of breath EXAM: PORTABLE CHEST 1 VIEW COMPARISON:  08/05/2020, 01/17/2020 CT 10/01/2020 FINDINGS: Rotated patient. The right lung is grossly clear. Chronic scarring at the left lung base. Enlarged cardiomediastinal silhouette with aortic atherosclerosis. No pneumothorax. IMPRESSION: Cardiomegaly without overt failure. Probable chronic scarring left lung base Electronically Signed   By: Donavan Foil M.D.   On: 12/13/2020 19:27    ____________________________________________   PROCEDURES Procedures  ____________________________________________  DIFFERENTIAL DIAGNOSIS   Pneumonia, pulmonary edema, pleural effusion, COVID-19, influenza, dehydration, electrolyte abnormality  CLINICAL IMPRESSION / ASSESSMENT AND PLAN / ED COURSE  Medications ordered in the ED: Medications  methylPREDNISolone sodium succinate (SOLU-MEDROL) 125 mg/2 mL injection 125 mg (125 mg Intravenous Given 12/13/20 2052)  ipratropium-albuterol (DUONEB) 0.5-2.5 (3) MG/3ML nebulizer solution 3 mL (3 mLs Nebulization Given 12/13/20 2053)  albuterol (PROVENTIL) (2.5 MG/3ML) 0.083% nebulizer solution 5 mg (5 mg Nebulization Given 12/13/20 2053)  magnesium sulfate IVPB 2 g 50 mL (0 g Intravenous Stopped 12/13/20 2238)  sodium chloride 0.9 % bolus 500 mL (0 mLs Intravenous Stopped 12/13/20 2210)  iohexol (OMNIPAQUE) 300 MG/ML solution 100 mL (100 mLs Intravenous Contrast Given 12/13/20 2132)    Pertinent labs & imaging results that were available during my care of the patient were reviewed by  me and considered in my medical decision making (see chart for details).   Sara Pierce was evaluated in Emergency Department on 12/14/2020 for the symptoms described in the history of present illness. She was evaluated in the context of the global COVID-19 pandemic, which necessitated consideration that the patient might be at risk for infection with the SARS-CoV-2 virus that causes COVID-19. Institutional protocols and algorithms that pertain to the evaluation of patients at risk for COVID-19 are in a state of rapid change based on information released by regulatory bodies including the CDC and federal and state organizations. These policies and algorithms were followed during the patient's care in the ED.   Patient brought to the ED with worry about shortness of breath.  Oxygenation is essentially stable on her chronic 3 L nasal cannula.  Chest x-ray and initial lab panel unremarkable.  CT scan of the chest abdomen pelvis obtained which is also unremarkable without signs of effusion or edema.  White blood cell count is normal, procalcitonin is negative, urinalysis normal.  BUN slightly elevated suggestive of dehydration in the setting of twice daily torsemide use.  Patient was given some gentle IV fluids, clinically improved.  We will have her decrease torsemide for the next 3 days and follow-up with primary care or heart failure clinic.  At this point with reassuring work-up I think she is stable for discharge home [facility] and continued outpatient management, return precautions worsening symptoms      ____________________________________________   FINAL CLINICAL IMPRESSION(S) / ED DIAGNOSES    Final diagnoses:  Dehydration  Chronic obstructive pulmonary disease, unspecified COPD type (HCC)  Chronic congestive heart failure, unspecified heart failure type (  [facility])     ED Discharge Orders    None      Portions of this note were generated with dragon dictation software. Dictation  errors may occur despite best attempts at proofreading.   Carrie Mew, MD 12/14/20 [phone]

## 2020-12-14 NOTE — ED Notes (Signed)
Pt noted to urinate bed. Pt cleaned, linens changed, and purewick repositioned.

## 2020-12-14 NOTE — TOC Progression Note (Signed)
Transition of Care [facility]) - Progression Note    Patient Details  Name: Sara Pierce MRN: [id number] Date of Birth: 05/02/1941  Transition of Care [facility]) CM/SW Contact  Larwance Rote, LCSW Phone Number: 12/14/2020, 5:00 PM  Clinical Narrative:   CSW unable to reach pt's husband Mr. Kerington Hildebrant, [phone].  CSW unable to reach patient's son, Arabella Merles.   CSW discussed with patient that there is no current address in the system for patient to be discharged home. Patient does not know where he husband lives. She noted that her husband moved several times and she does not know where he lives. Patient agreed to return to [facility].   CSW call [facility] to confirm that patient has a room.   CSW notified nurse.   Expected Discharge Plan: Home w Home Health Services Barriers to Discharge: Other (comment) (TOC waiing for [facility] and DME agency confirmation)  Expected Discharge Plan and Services Expected Discharge Plan: Home w Home Health Services In-house Referral: Clinical Social Work   Post Acute Care Choice: Home Health Living arrangements for the past 2 months: Skilled Nursing Facility ([facility])                 DME Arranged: 3-N-1,Oxygen,Wheelchair manual [facility] Bed) DME Agency: [employer] Date DME Agency Contacted: 12/14/20 Time DME Agency Contacted: 1302 Representative spoke with at DME Agency: Delorise Jackson [phone] [facility] Arranged: PT,OT,RN,Nurse's Aide,Social Work [employer] Agency: [employer] ([facility]) Date HH Agency Contacted: 12/14/20 Time HH Agency Contacted: 1308 Representative spoke with at [facility] Agency: Delorise Jackson [phone]   Social Determinants of Health (SDOH) Interventions    Readmission Risk Interventions Readmission Risk Prevention Plan 01/17/2020  Transportation Screening Complete  PCP or Specialist Appt within 3-5 Days Not Complete  Not Complete comments SNF resident  Social Work Consult for Recovery Care  Planning/Counseling Complete  Palliative Care Screening Complete  Medication Review [facility]) Referral to Pharmacy  Some recent data might be hidden

## 2020-12-14 NOTE — ED Notes (Signed)
APS called back and RN gave report. APS states they will call back if they have any other questions.

## 2020-12-14 NOTE — ED Notes (Signed)
Pt provided with diet tray and set up at this time.

## 2020-12-14 NOTE — ED Notes (Addendum)
SW in to speak with pt and husband. Made aware of husbands request.

## 2020-12-14 NOTE — ED Provider Notes (Signed)
I assumed care of this patient approximately 1500.  In brief patient is waiting for social work evaluation as she does not wish to go back to her.  Nursing home.  However after discussion with social worker there are no other options at this time and explained this to the patient.  She is amenable to returning to counseling home.  Discharge in stable condition.    Gilles Chiquito, MD 12/14/20 [phone]

## 2020-12-14 NOTE — Progress Notes (Signed)
PT Cancellation Note  Patient Details Name: Sara Pierce MRN: [id number] DOB: 05/29/1941   Cancelled Treatment:    Reason Eval/Treat Not Completed: PT screened, no needs identified, will sign off;Other (comment). Chart reviewed due to placement of second imminent discharge PT orders. Per CSW note, pt planning on discharging home with husband. PT spoke with pt earlier today where pt [facility] and being at her baseline level of functioning. Pt reliant on hoyer lift and 24/7 assistance for all mobility needs and ADLs. Anticipate pt upon discharge home will need EMS transport, hoyer lift, hospital bed, wheelchair, and 24/7 supervision/assistance.   Olga Coaster PT, DPT 3:03 PM,12/14/20

## 2020-12-14 NOTE — ED Notes (Signed)
Called in room per husband. States that he wants to take pt home with him. Informed him that pt will need hoyer lift and other things to properly take care of her. Husband verbalizes understanding. Will inform social work when seen.

## 2020-12-14 NOTE — ED Notes (Signed)
RN called central dispatch to talk with on call adult protective services (APS). APS to call back for report.

## 2020-12-15 LAB — URINE CULTURE: Culture: NO GROWTH

## 2020-12-18 LAB — CULTURE, BLOOD (SINGLE)
Culture: NO GROWTH
Special Requests: ADEQUATE

## 2021-01-02 NOTE — Progress Notes (Signed)
Patient ID: Sara Pierce, female    DOB: September 04, 1941, 80 y.o.   MRN: [id number]  HPI  Ms Schiffman is a 80 y/o female with a history of clotting disorder, DM, HTN, CKD, thyroid disease, COPD, depression, obstructive sleep apnea, previous tobacco use and chronic heart failure.   Echo report from 04/16/18 reviewed and showed an EF of 55-65% along with trivial AR and mild MR.  Was in the ED 12/13/20 due to shortness of breath at [facility]. Abdomen appeared more distended. Chest x-ray and initial lab panel unremarkable.  CT scan of the chest abdomen pelvis obtained which is also unremarkable without signs of effusion or edema. BUN slightly elevated signifying possible dehydration. Gentle IV fluids given with short-term reduction in oral diuretic.   She presents today for a follow-up visit although hasn't been seen since 2019. She presents with a chief complaint of moderate shortness of breath upon little exertion. She describes this as chronic in nature having been present for several years although has worsened over the last few months. She has associated fatigue, productive cough, wheezing, light-headedness and anxiety along with this. She denies any difficulty sleeping, abdominal distention, palpitations, pedal edema or chest pain.   Currently living at [facility] and says that she doesn't get weighed daily. Says that her last CXR was ~ 2 weeks ago.   Past Medical History:  Diagnosis Date  . Arthritis   . Cardiomyopathy ([place])   . Cataract   . Cerebral hemorrhage ([place])   . CHF (congestive heart failure) ([place])   . Chronic kidney disease   . Clotting disorder ([place])   . COPD (chronic obstructive pulmonary disease) ([place])   . Depression   . Diabetes mellitus without complication ([place])   . Hypertension   . Mixed incontinence   . Obesity   . Sleep apnea   . Thyroid disease   . Urinary frequency   . Vaginal atrophy   . Varicose veins    Past Surgical History:  Procedure Laterality Date  .  APPENDECTOMY    . Cardiac Catherization    . COLONOSCOPY WITH PROPOFOL N/A 04/28/2016   Procedure: COLONOSCOPY WITH PROPOFOL;  Surgeon: Manya Silvas, MD;  Location: [facility] ENDOSCOPY;  Service: Endoscopy;  Laterality: N/A;  . COLONOSCOPY WITH PROPOFOL N/A 04/29/2016   Procedure: COLONOSCOPY WITH PROPOFOL;  Surgeon: Manya Silvas, MD;  Location[facility] ENDOSCOPY;  Service: Endoscopy;  Laterality: N/A;  . HIP PINNING,CANNULATED Left 08/09/2020   Procedure: CANNULATED HIP PINNING;  Surgeon: Thornton Park, MD;  Location: [facility] ORS;  Service: Orthopedics;  Laterality: Left;  . JOINT REPLACEMENT    . STRABISMUS SURGERY    . TONSILLECTOMY    . TUBAL LIGATION     Family History  Problem Relation Age of Onset  . Heart attack Father   . Coronary artery disease Father   . Hypertension Father   . Hyperlipidemia Father   . Heart attack Mother   . Hypertension Mother   . Hyperlipidemia Mother   . Breast cancer Neg Hx    Social History   Tobacco Use  . Smoking status: Former Smoker    Packs/day: 0.50    Years: 44.00    Pack years: 22.00    Types: Cigarettes  . Smokeless tobacco: Never Used  Substance Use Topics  . Alcohol use: No   Allergies  Allergen Reactions  . Aspirin Swelling  . Codeine Itching  . Naprosyn [Naproxen] Other (See Comments)    Per MAR pt  allergic  . Other Other (See Comments)    Elastic Bandages/ Supports Per MAR  . Tape Hives  . Valacyclovir Other (See Comments)    Reaction: unknown  . Latex Rash   Prior to Admission medications   Medication Sig Start Date End Date Taking? Authorizing Provider  acetaminophen (TYLENOL) 500 MG tablet Take 500 mg by mouth 3 (three) times daily.   Yes [provider]  albuterol (VENTOLIN HFA) 108 (90 Base) MCG/ACT inhaler Inhale 2 puffs into the lungs every 6 (six) hours as needed for wheezing or shortness of breath.   Yes [provider]  ALPRAZolam (XANAX) 0.25 MG tablet Take 1 tablet (0.25 mg total) by mouth  2 (two) times daily. 08/12/20  Yes Arrien, Jimmy Picket, MD  Amino Acids-Protein Hydrolys (FEEDING SUPPLEMENT, PRO-STAT SUGAR FREE 64,) LIQD Take 30 mLs by mouth daily.   Yes [provider]  B Complex-C (B-COMPLEX WITH VITAMIN C) tablet Take 1 tablet by mouth daily.   Yes [provider]  budesonide (PULMICORT) 180 MCG/ACT inhaler Inhale 1 puff into the lungs 2 (two) times daily.    Yes [provider]  buPROPion (WELLBUTRIN SR) 100 MG 12 hr tablet Take 100 mg by mouth daily.   Yes [provider]  carvedilol (COREG) 3.125 MG tablet Take 3.125 mg by mouth at bedtime.   Yes [provider]  clopidogrel (PLAVIX) 75 MG tablet Take 75 mg by mouth daily.   Yes [provider]  dextromethorphan-guaiFENesin (MUCINEX DM) 30-600 MG 12hr tablet Take 1 tablet by mouth 2 (two) times daily.   Yes [provider]  ferrous sulfate 325 (65 FE) MG EC tablet Take 325 mg by mouth every other day.   Yes [provider]  FLUoxetine (PROZAC) 20 MG capsule Take 60 mg by mouth daily.    Yes [provider]  fluticasone (FLONASE) 50 MCG/ACT nasal spray Place 2 sprays into both nostrils daily.   Yes [provider]  gabapentin (NEURONTIN) 100 MG capsule Take 2 capsules (200 mg total) by mouth 3 (three) times daily. Patient taking differently: Take 300 mg by mouth 3 (three) times daily. 04/22/18  Yes Gouru, Illene Silver, MD  hydrALAZINE (APRESOLINE) 25 MG tablet Take 25 mg by mouth 3 (three) times daily.   Yes [provider]  HYDROcodone-acetaminophen (NORCO) 7.5-325 MG tablet Take 1 tablet by mouth every 6 (six) hours as needed for severe pain. 08/12/20  Yes Arrien, Jimmy Picket, MD  hydrOXYzine (ATARAX/VISTARIL) 10 MG tablet Take 10 mg by mouth every 8 (eight) hours as needed for anxiety.   Yes [provider]  hydrOXYzine (ATARAX/VISTARIL) 25 MG tablet Take 25 mg by mouth in the morning and at bedtime.   Yes [provider]  Ipratropium-Albuterol (COMBIVENT RESPIMAT) 20-100 MCG/ACT AERS respimat Inhale 1 puff into the lungs 3 (three) times daily. Rinse mouth with water after use   Yes [provider]  magnesium oxide (MAG-OX) 400 MG tablet Take 400 mg by mouth daily.   Yes [provider]  melatonin 3 MG TABS tablet Take 3 mg by mouth at bedtime.   Yes [provider]  mirtazapine (REMERON) 7.5 MG tablet Take 7.5 mg by mouth at bedtime.    Yes [provider]  montelukast (SINGULAIR) 10 MG tablet Take 10 mg by mouth at bedtime.    Yes [provider]  ondansetron (ZOFRAN) 4 MG tablet Take 1 tablet (4 mg total) by mouth every 6 (six) hours as  needed for nausea. 01/22/20  Yes Hoyt Koch, MD  polycarbophil (FIBERCON) 625 MG tablet Take 625 mg by mouth daily.   Yes [provider]  polyethylene glycol (MIRALAX / GLYCOLAX) 17 g packet Take 17 g by mouth daily as needed for mild constipation.   Yes [provider]  potassium chloride SA (KLOR-CON) 20 MEQ tablet Take 20 mEq by mouth daily.   Yes [provider]  predniSONE (DELTASONE) 10 MG tablet Take 10 mg by mouth every evening.   Yes [provider]  primidone (MYSOLINE) 50 MG tablet Take 50 mg by mouth at bedtime.    Yes [provider]  simvastatin (ZOCOR) 20 MG tablet Take 20 mg by mouth daily.   Yes [provider]  spironolactone (ALDACTONE) 25 MG tablet Take 25 mg by mouth daily.   Yes [provider]  tiZANidine (ZANAFLEX) 4 MG tablet Take 4 mg by mouth every 6 (six) hours as needed for muscle spasms.   Yes [provider]  torsemide (DEMADEX) 20 MG tablet Take 40 mg by mouth 2 (two) times daily.   Yes [provider]  umeclidinium-vilanterol (ANORO ELLIPTA) 62.5-25 MCG/INH AEPB Inhale 1 puff into the lungs daily.   Yes [provider]  vitamin B-12 (CYANOCOBALAMIN) 1000 MCG tablet Take 1,000 mcg by mouth  daily.   Yes [provider]  fluticasone (VERAMYST) 27.5 MCG/SPRAY nasal spray Place 2 sprays into the nose daily. Patient not taking: Reported on 12/14/2020    [provider]  ipratropium-albuterol (DUONEB) 0.5-2.5 (3) MG/3ML SOLN Inhale 3 mLs into the lungs 3 (three) times daily. 08/12/20 09/11/20  Arrien, Jimmy Picket, MD  saccharomyces boulardii (FLORASTOR) 250 MG capsule Take 250 mg by mouth daily.    [provider]    Review of Systems  Constitutional: Positive for fatigue. Negative for appetite change.  HENT: Negative for congestion, postnasal drip and sore throat.   Eyes: Negative.   Respiratory: Positive for cough (loose cough), shortness of breath and wheezing (at times). Negative for chest tightness.   Cardiovascular: Negative for chest pain, palpitations and leg swelling.  Gastrointestinal: Negative for abdominal distention and abdominal pain.  Endocrine: Negative.   Genitourinary: Negative.   Musculoskeletal: Negative for back pain and neck pain.  Skin: Negative.   Allergic/Immunologic: Negative.   Neurological: Positive for light-headedness. Negative for dizziness.  Hematological: Negative for adenopathy. Bruises/bleeds easily.  Psychiatric/Behavioral: Negative for dysphoric mood and sleep disturbance (sleeping with oxygen on). The patient is nervous/anxious.    Vitals:   01/05/21 0924  BP: 126/89  Pulse: (!) 52  Resp: 16  SpO2: 96%  Weight: 247 lb (112 kg)  Height: 5\' 5"  (1.651 m)   Wt Readings from Last 3 Encounters:  01/05/21 247 lb (112 kg)  12/13/20 247 lb (112 kg)  08/05/20 240 lb (108.9 kg)   Lab Results  Component Value Date   CREATININE 0.74 12/13/2020   CREATININE 0.81 08/11/2020   CREATININE 0.77 08/10/2020    Physical Exam Vitals and nursing note reviewed.  Constitutional:      Appearance: She is well-developed.  HENT:     Head: Normocephalic and atraumatic.  Neck:     Vascular: No JVD.  Cardiovascular:      Rate and Rhythm: Regular rhythm. Bradycardia present.  Pulmonary:     Effort: Pulmonary effort is normal. No respiratory distress.     Breath sounds: Examination of the right-upper field reveals rhonchi. Examination of the left-upper field reveals rhonchi.  Examination of the right-lower field reveals rhonchi. Examination of the left-lower field reveals rhonchi. Rhonchi present. No wheezing or rales.  Abdominal:     General: There is no distension.     Palpations: Abdomen is soft.     Tenderness: There is abdominal tenderness.  Musculoskeletal:        General: No tenderness.     Cervical back: Neck supple.     Right lower leg: No tenderness. No edema.     Left lower leg: No tenderness. No edema.     Comments: Both feet are straightened at the ankles  Skin:    General: Skin is warm and dry.  Neurological:     Mental Status: She is alert and oriented to person, place, and time.  Psychiatric:        Mood and Affect: Mood normal.        Behavior: Behavior normal.        Thought Content: Thought content normal.    Assessment & Plan:  1: Chronic heart failure with preserved ejection fraction- - NYHA class III - euvolemic today - not being weighed daily so order placed to weigh daily and call for an overnight weight gain of > 2 pounds or a weekly weight gain of >5 pounds - not adding salt to her food.  - is not physically active as she's unable to stand - saw cardiology Etta Quill) 03/23/18; order written for facility to schedule f/u appointment w/ cardiology - currently wearing oxygen at 3L around the clock  - palliative care visit done 05/08/20 - BNP 08/05/20 was 143.8 - reports receiving 2 covid vaccines  2: HTN- - BP looks good today - currently seeing PCP at [facility] - BMP on 12/13/20 reviewed and showed sodium 138, potassium 3.3, creatinine 0.74 and GFR >60  3: COPD- - wearing oxygen at 3L - using nebulizer and inhalers - saw pulmonology Ashby Dawes) 08/30/18;  order written for facility to schedule pulmonology f/u ; may need repeat CXR   Facility medication list reviewed.   Due to patient's preference, will not schedule a follow-up appointment at this time. Facility can call if they need to schedule a follow-up appointment and patient was comfortable with this plan. She says that it's too difficult for her to get dressed and if she's going to see cardiology and pulmonology, she doesn't want to continue here.

## 2021-01-05 ENCOUNTER — Encounter: Payer: Self-pay | Admitting: Family

## 2021-01-05 ENCOUNTER — Ambulatory Visit: Payer: Medicare Other | Attending: Family | Admitting: Family

## 2021-01-05 ENCOUNTER — Other Ambulatory Visit: Payer: Self-pay

## 2021-01-05 VITALS — BP 126/89 | HR 52 | Resp 16 | Ht 65.0 in | Wt 247.0 lb

## 2021-01-05 DIAGNOSIS — Z886 Allergy status to analgesic agent status: Secondary | ICD-10-CM | POA: Insufficient documentation

## 2021-01-05 DIAGNOSIS — G4733 Obstructive sleep apnea (adult) (pediatric): Secondary | ICD-10-CM | POA: Insufficient documentation

## 2021-01-05 DIAGNOSIS — Z87891 Personal history of nicotine dependence: Secondary | ICD-10-CM | POA: Diagnosis not present

## 2021-01-05 DIAGNOSIS — Z7902 Long term (current) use of antithrombotics/antiplatelets: Secondary | ICD-10-CM | POA: Diagnosis not present

## 2021-01-05 DIAGNOSIS — N189 Chronic kidney disease, unspecified: Secondary | ICD-10-CM | POA: Diagnosis not present

## 2021-01-05 DIAGNOSIS — I130 Hypertensive heart and chronic kidney disease with heart failure and stage 1 through stage 4 chronic kidney disease, or unspecified chronic kidney disease: Secondary | ICD-10-CM | POA: Insufficient documentation

## 2021-01-05 DIAGNOSIS — Z7952 Long term (current) use of systemic steroids: Secondary | ICD-10-CM | POA: Diagnosis not present

## 2021-01-05 DIAGNOSIS — J449 Chronic obstructive pulmonary disease, unspecified: Secondary | ICD-10-CM | POA: Diagnosis not present

## 2021-01-05 DIAGNOSIS — Z8249 Family history of ischemic heart disease and other diseases of the circulatory system: Secondary | ICD-10-CM | POA: Insufficient documentation

## 2021-01-05 DIAGNOSIS — Z7951 Long term (current) use of inhaled steroids: Secondary | ICD-10-CM | POA: Insufficient documentation

## 2021-01-05 DIAGNOSIS — E1122 Type 2 diabetes mellitus with diabetic chronic kidney disease: Secondary | ICD-10-CM | POA: Insufficient documentation

## 2021-01-05 DIAGNOSIS — D689 Coagulation defect, unspecified: Secondary | ICD-10-CM | POA: Insufficient documentation

## 2021-01-05 DIAGNOSIS — I5032 Chronic diastolic (congestive) heart failure: Secondary | ICD-10-CM | POA: Insufficient documentation

## 2021-01-05 DIAGNOSIS — Z888 Allergy status to other drugs, medicaments and biological substances status: Secondary | ICD-10-CM | POA: Diagnosis not present

## 2021-01-05 DIAGNOSIS — Z885 Allergy status to narcotic agent status: Secondary | ICD-10-CM | POA: Insufficient documentation

## 2021-01-05 DIAGNOSIS — E079 Disorder of thyroid, unspecified: Secondary | ICD-10-CM | POA: Diagnosis not present

## 2021-01-05 DIAGNOSIS — Z9981 Dependence on supplemental oxygen: Secondary | ICD-10-CM | POA: Insufficient documentation

## 2021-01-05 DIAGNOSIS — Z79899 Other long term (current) drug therapy: Secondary | ICD-10-CM | POA: Diagnosis not present

## 2021-01-05 DIAGNOSIS — F32A Depression, unspecified: Secondary | ICD-10-CM | POA: Diagnosis not present

## 2021-01-05 DIAGNOSIS — I10 Essential (primary) hypertension: Secondary | ICD-10-CM

## 2021-01-05 NOTE — Patient Instructions (Addendum)
Begin weighing daily and call for an overnight weight gain of > 2 pounds or a weekly weight gain of >5 pounds.   Call us in the future if you'd like to schedule another appointment.  

## 2021-01-28 ENCOUNTER — Other Ambulatory Visit: Payer: Self-pay

## 2021-01-28 ENCOUNTER — Emergency Department: Payer: Medicare Other

## 2021-01-28 ENCOUNTER — Emergency Department
Admission: EM | Admit: 2021-01-28 | Discharge: 2021-01-28 | Disposition: A | Discharge status: Home / Self Care | Payer: Medicare Other | Attending: Emergency Medicine | Admitting: Emergency Medicine

## 2021-01-28 DIAGNOSIS — S3992XA Unspecified injury of lower back, initial encounter: Secondary | ICD-10-CM | POA: Diagnosis present

## 2021-01-28 DIAGNOSIS — M25552 Pain in left hip: Secondary | ICD-10-CM | POA: Insufficient documentation

## 2021-01-28 DIAGNOSIS — W050XXA Fall from non-moving wheelchair, initial encounter: Secondary | ICD-10-CM | POA: Insufficient documentation

## 2021-01-28 DIAGNOSIS — J449 Chronic obstructive pulmonary disease, unspecified: Secondary | ICD-10-CM | POA: Insufficient documentation

## 2021-01-28 DIAGNOSIS — Z91040 Latex allergy status: Secondary | ICD-10-CM | POA: Diagnosis not present

## 2021-01-28 DIAGNOSIS — S0990XA Unspecified injury of head, initial encounter: Secondary | ICD-10-CM | POA: Diagnosis not present

## 2021-01-28 DIAGNOSIS — S32592A Other specified fracture of left pubis, initial encounter for closed fracture: Secondary | ICD-10-CM

## 2021-01-28 DIAGNOSIS — S32511A Fracture of superior rim of right pubis, initial encounter for closed fracture: Secondary | ICD-10-CM | POA: Diagnosis not present

## 2021-01-28 DIAGNOSIS — I130 Hypertensive heart and chronic kidney disease with heart failure and stage 1 through stage 4 chronic kidney disease, or unspecified chronic kidney disease: Secondary | ICD-10-CM | POA: Insufficient documentation

## 2021-01-28 DIAGNOSIS — I5032 Chronic diastolic (congestive) heart failure: Secondary | ICD-10-CM | POA: Insufficient documentation

## 2021-01-28 DIAGNOSIS — E119 Type 2 diabetes mellitus without complications: Secondary | ICD-10-CM | POA: Diagnosis not present

## 2021-01-28 DIAGNOSIS — N189 Chronic kidney disease, unspecified: Secondary | ICD-10-CM | POA: Insufficient documentation

## 2021-01-28 DIAGNOSIS — E039 Hypothyroidism, unspecified: Secondary | ICD-10-CM | POA: Insufficient documentation

## 2021-01-28 DIAGNOSIS — Z87891 Personal history of nicotine dependence: Secondary | ICD-10-CM | POA: Insufficient documentation

## 2021-01-28 DIAGNOSIS — Z79899 Other long term (current) drug therapy: Secondary | ICD-10-CM | POA: Diagnosis not present

## 2021-01-28 DIAGNOSIS — Z7951 Long term (current) use of inhaled steroids: Secondary | ICD-10-CM | POA: Insufficient documentation

## 2021-01-28 MED ORDER — TRAMADOL HCL 50 MG PO TABS: 50.0000 mg | ORAL_TABLET | Freq: Four times a day (QID) | ORAL | 0 refills | Status: AC | PRN

## 2021-01-28 NOTE — ED Notes (Signed)
D/C vitals

## 2021-01-28 NOTE — ED Notes (Signed)
Pt given water and a warm blanket, pt denies any further needs at this time. Call bell in reach.

## 2021-01-28 NOTE — ED Notes (Signed)
Pt still waiting for EMS transport back to [facility], pt denies any needs at this time. Call bell in reach.

## 2021-01-28 NOTE — ED Notes (Signed)
Called [facility] for transport to [facility]

## 2021-01-28 NOTE — ED Notes (Signed)
Pt presents to ED with c/o of being transported via wheelchair van when EMS and pt states pt fell backwards while Lucianne Lei was moving. Pt states "pain everywhere". Pt states mostly back pain from c-spine to L-spine. Pt normally wears 3L/min at all times. Pt is A&Ox4. No deformity noted, Shortening to L hip, which per EMS is normal, CMS intact.
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
## 2021-01-28 NOTE — ED Notes (Signed)
Report to [place] at [employer] [facility]

## 2021-03-09 ENCOUNTER — Other Ambulatory Visit: Payer: Self-pay | Admitting: Internal Medicine

## 2021-03-09 NOTE — Progress Notes (Signed)
[place]  PROGRESS NOTE  PULMONARY SERVICE ROUNDS   Sara Pierce  DOB: April 12, 1941  Referring physician: Deanne Coffer, MD  HPI: Sara Pierce is a 80 y.o. female  being seen for Acute on Chronic Respiratory Failure.  Patient is on T-piece right now respiratory therapy still reporting copious amounts of secretions.  Review of Systems: Unremarkable other than noted in HPI  Allergies:  Reviewed on the [facility]  Medications: Reviewed  Vitals: Temperature is 98.2 pulse 7040 respiratory 24 blood pressure is 108/62 saturations 92%  Ventilator Settings: Off the ventilator on T-piece  Physical Exam: . General:  calm and comfortable NAD . Eyes: normal lids, irises & conjunctiva . ENT: grossly normal tongue not enlarged . Neck: no masses . Cardiovascular: S1 S2 Normal no rubs no gallop . Respiratory: No rhonchi very coarse breath sounds . Abdomen: soft non-distended . Skin: no rash seen on limited exam . Musculoskeletal:  no rigidity . Psychiatric: unable to assess . Neurologic: no involuntary movements          Lab Data and radiological Data:  Labs have been reviewed   Assessment/Plan  Patient Active Problem List   Diagnosis Date Noted  . Chronic respiratory failure with hypoxia ([place]) 08/05/2020  . Closed left hip fracture, initial encounter ([place]) 08/05/2020  . Preoperative clearance 08/05/2020  . Osteoporotic fracture of left hip, initial encounter ([place]) 08/05/2020  . Shortness of breath   . Palliative care by specialist   . DNR (do not resuscitate)   . Acute on chronic renal insufficiency   . Adult failure to thrive   . C. difficile colitis 01/16/2020  . Perirectal abscess 06/02/2018  . Chronic diastolic heart failure ([place]) 05/12/2018  . NSTEMI (non-ST elevated myocardial infarction) ([place]) 04/15/2018  . COPD (chronic obstructive pulmonary disease) ([place]) 01/04/2018  . Pressure injury of skin 10/25/2017  . Absolute anemia 03/15/2015  . Anxiety  03/15/2015  . Arthritis 03/15/2015  . Cataract 03/15/2015  . Chronic constipation 03/15/2015  . Blood clotting disorder ([place]) 03/15/2015  . CAFL (chronic airflow limitation) ([place]) 03/15/2015  . Clinical depression 03/15/2015  . Diabetes ([place]) 03/15/2015  . H/O varicella 03/15/2015  . BP (high blood pressure) 03/15/2015  . Mixed incontinence 03/15/2015  . 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 Adiposity 03/15/2015  . Apnea, sleep 03/15/2015  . Hypothyroidism 03/15/2015  . FOM (frequency of micturition) 03/15/2015  . 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 Candida vaginitis 03/15/2015  . Abnormal ECG 03/15/2015      1. Acute on chronic respiratory failure hypoxia at this time patient is going to continue with the T collar he has significant copious amounts of secretions noted.  We will continue to monitor closely. 2. Severe COPD we will continue with medical management. 3. Chronic diastolic heart failure appears to be compensated we will continue with monitoring fluid status diuretics as tolerated. 4. Acute delirium confusion patient is being managed by primary care team will continue to patient.   I have personally evaluated the patient, evaluated the laboratory and imaging results and formulated the assessment and plan and placed orders as needed. The Patient requires high complexity decision making with multiple system involvement. Rounds were done with the Respiratory Therapy Director and respiratory therapist involved in the care of the patient as well as nursing staff.   Allyne Gee, MD [facility] Pulmonary Critical Care Medicine   This note is for inpatient care

## 2021-03-10 ENCOUNTER — Other Ambulatory Visit: Payer: Self-pay | Admitting: Internal Medicine

## 2021-03-10 NOTE — Progress Notes (Signed)
[place]  PROGRESS NOTE  PULMONARY SERVICE ROUNDS   KATORIA YETMAN  DOB: 06-May-1941  Referring physician: Deanne Coffer, MD  HPI: Sara Pierce is a 80 y.o. female  being seen for Acute on Chronic Respiratory Failure.  Patient is on the T collar right now still has copious amounts of secretions requiring frequent suctioning.  Review of Systems: Unremarkable other than noted in HPI  Allergies:  Reviewed on the [facility]  Medications: Reviewed  Vitals: Temperature is 97.8 pulse 69 respiratory rate 14 blood pressure is 117/55 saturations 95%  Ventilator Settings: On T collar FiO2 of 35%  Physical Exam: . General:  calm and comfortable NAD . Eyes: normal lids, irises & conjunctiva . ENT: grossly normal tongue not enlarged . Neck: no masses . Cardiovascular: S1 S2 Normal no rubs no gallop . Respiratory: Scattered rhonchi expansion is equal at this time . Abdomen: soft non-distended . Skin: no rash seen on limited exam . Musculoskeletal:  no rigidity . Psychiatric: unable to assess . Neurologic: no involuntary movements          Lab Data and radiological Data:  Labs have been reviewed   Assessment/Plan  Patient Active Problem List   Diagnosis Date Noted  . Chronic respiratory failure with hypoxia ([place]) 08/05/2020  . Closed left hip fracture, initial encounter ([place]) 08/05/2020  . Preoperative clearance 08/05/2020  . Osteoporotic fracture of left hip, initial encounter ([place]) 08/05/2020  . Shortness of breath   . Palliative care by specialist   . DNR (do not resuscitate)   . Acute on chronic renal insufficiency   . Adult failure to thrive   . C. difficile colitis 01/16/2020  . Perirectal abscess 06/02/2018  . Chronic diastolic heart failure ([place]) 05/12/2018  . NSTEMI (non-ST elevated myocardial infarction) ([place]) 04/15/2018  . COPD (chronic obstructive pulmonary disease) ([place]) 01/04/2018  . Pressure injury of skin 10/25/2017  . Absolute anemia  03/15/2015  . Anxiety 03/15/2015  . Arthritis 03/15/2015  . Cataract 03/15/2015  . Chronic constipation 03/15/2015  . Blood clotting disorder ([place]) 03/15/2015  . CAFL (chronic airflow limitation) ([place]) 03/15/2015  . Clinical depression 03/15/2015  . Diabetes ([place]) 03/15/2015  . H/O varicella 03/15/2015  . BP (high blood pressure) 03/15/2015  . Mixed incontinence 03/15/2015  . 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 Adiposity 03/15/2015  . Apnea, sleep 03/15/2015  . Hypothyroidism 03/15/2015  . FOM (frequency of micturition) 03/15/2015  . 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 Candida vaginitis 03/15/2015  . Abnormal ECG 03/15/2015      1. Acute on chronic respiratory failure hypoxia at this time patient is on T collar copious secretions are noted and appears to be the main limiting factor as far as being able to wean. 2. Severe COPD we will continue with medical management. 3. Chronic diastolic heart failure no change continue supportive care 4. Acute delirium with confusion very slow to improve we will continue to follow along closely.   I have personally evaluated the patient, evaluated the laboratory and imaging results and formulated the assessment and plan and placed orders as needed. The Patient requires high complexity decision making with multiple system involvement. Rounds were done with the Respiratory Therapy Director and respiratory therapist involved in the care of the patient as well as nursing staff.   Allyne Gee, MD [facility] Pulmonary Critical Care Medicine   This note is for inpatient care

## 2021-03-11 ENCOUNTER — Other Ambulatory Visit: Payer: Self-pay | Admitting: Internal Medicine

## 2021-03-11 NOTE — Progress Notes (Signed)
Sara Pierce  PROGRESS NOTE  PULMONARY SERVICE ROUNDS   Sara Pierce  DOB: 08-24-1941  Referring physician: Deanne Coffer, MD  HPI: Sara Pierce is a 80 y.o. female  being seen for Acute on Chronic Respiratory Failure.  She is on T collar currently on 35% FiO2.  She remains basically nonverbal  Review of Systems: Unremarkable other than noted in HPI  Allergies:  Reviewed on the [facility]  Medications: Reviewed  Vitals: Temperature is 97.6 pulse 73 respiratory rate 17 blood pressure is 138/70 saturations 97%  Ventilator Settings: Off the ventilator on T collar FiO2 of 35%  Physical Exam: . General:  calm and comfortable NAD . Eyes: normal lids, irises & conjunctiva . ENT: grossly normal tongue not enlarged . Neck: no masses . Cardiovascular: S1 S2 Normal no rubs no gallop . Respiratory: Scattered rhonchi expansion is equal . Abdomen: soft non-distended . Skin: no rash seen on limited exam . Musculoskeletal:  no rigidity . Psychiatric: unable to assess . Neurologic: no involuntary movements          Lab Data and radiological Data:  Labs have been reviewed   Assessment/Plan  Patient Active Problem List   Diagnosis Date Noted  . Chronic respiratory failure with hypoxia ([place]) 08/05/2020  . Closed left hip fracture, initial encounter ([place]) 08/05/2020  . Preoperative clearance 08/05/2020  . Osteoporotic fracture of left hip, initial encounter ([place]) 08/05/2020  . Shortness of breath   . Palliative care by specialist   . DNR (do not resuscitate)   . Acute on chronic renal insufficiency   . Adult failure to thrive   . C. difficile colitis 01/16/2020  . Perirectal abscess 06/02/2018  . Chronic diastolic heart failure ([place]) 05/12/2018  . NSTEMI (non-ST elevated myocardial infarction) ([place]) 04/15/2018  . COPD (chronic obstructive pulmonary disease) ([place]) 01/04/2018  . Pressure injury of skin 10/25/2017  . Absolute anemia 03/15/2015  . Anxiety  03/15/2015  . Arthritis 03/15/2015  . Cataract 03/15/2015  . Chronic constipation 03/15/2015  . Blood clotting disorder ([place]) 03/15/2015  . CAFL (chronic airflow limitation) ([place]) 03/15/2015  . Clinical depression 03/15/2015  . Diabetes ([place]) 03/15/2015  . H/O varicella 03/15/2015  . BP (high blood pressure) 03/15/2015  . Mixed incontinence 03/15/2015  . 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 Adiposity 03/15/2015  . Apnea, sleep 03/15/2015  . Hypothyroidism 03/15/2015  . FOM (frequency of micturition) 03/15/2015  . 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 Candida vaginitis 03/15/2015  . Abnormal ECG 03/15/2015      1. Acute on chronic respiratory failure hypoxia patient is on T collar secretions are still very a factor as far as being able to eat. 2. Severe COPD medical management note nebulizers as deemed necessary 3. Chronic diastolic heart failure right now appears to be compensated 4. Acute delirium and confusion no change we will continue with supportive care   I have personally evaluated the patient, evaluated the laboratory and imaging results and formulated the assessment and plan and placed orders as needed. The Patient requires high complexity decision making with multiple system involvement. Rounds were done with the Respiratory Therapy Director and respiratory therapist involved in the care of the patient as well as nursing staff.   Allyne Gee, MD [facility] Pulmonary Critical Care Medicine   This note is for inpatient care

## 2021-03-12 ENCOUNTER — Other Ambulatory Visit (HOSPITAL_COMMUNITY): Payer: Medicare Other | Admitting: Internal Medicine

## 2021-03-12 DIAGNOSIS — R410 Disorientation, unspecified: Secondary | ICD-10-CM | POA: Diagnosis not present

## 2021-03-12 DIAGNOSIS — J449 Chronic obstructive pulmonary disease, unspecified: Secondary | ICD-10-CM

## 2021-03-12 DIAGNOSIS — J9621 Acute and chronic respiratory failure with hypoxia: Secondary | ICD-10-CM

## 2021-03-12 DIAGNOSIS — I5032 Chronic diastolic (congestive) heart failure: Secondary | ICD-10-CM

## 2021-03-12 NOTE — Progress Notes (Signed)
[place]  PROGRESS NOTE  PULMONARY SERVICE ROUNDS   JAZZELLE ZHANG  DOB: 07/23/1941  Referring physician: Deanne Coffer, MD  HPI: KYNNADI DICENSO is a 80 y.o. female  being seen for Acute on Chronic Respiratory Failure.  She is awake comfortable without distress has been on T collar oxygen decreased down to 35%  Review of Systems: Unremarkable other than noted in HPI  Allergies:  Reviewed on the [facility]  Medications: Reviewed  Vitals: Temperature is 98.0 pulse 78 respiratory rate 20 blood pressure is 139/62 saturations 93%  Ventilator Settings: On T collar with an FiO2 35  Physical Exam: . General:  calm and comfortable NAD . Eyes: normal lids, irises & conjunctiva . ENT: grossly normal tongue not enlarged . Neck: no masses . Cardiovascular: S1 S2 Normal no rubs no gallop . Respiratory: Scattered rhonchi noted bilaterally . Abdomen: soft non-distended . Skin: no rash seen on limited exam . Musculoskeletal:  no rigidity . Psychiatric: unable to assess . Neurologic: no involuntary movements          Lab Data and radiological Data:  Labs have been reviewed   Assessment/Plan  Patient Active Problem List   Diagnosis Date Noted  . Chronic respiratory failure with hypoxia ([place]) 08/05/2020  . Closed left hip fracture, initial encounter ([place]) 08/05/2020  . Preoperative clearance 08/05/2020  . Osteoporotic fracture of left hip, initial encounter ([place]) 08/05/2020  . Shortness of breath   . Palliative care by specialist   . DNR (do not resuscitate)   . Acute on chronic renal insufficiency   . Adult failure to thrive   . C. difficile colitis 01/16/2020  . Perirectal abscess 06/02/2018  . Chronic diastolic heart failure ([place]) 05/12/2018  . NSTEMI (non-ST elevated myocardial infarction) ([place]) 04/15/2018  . COPD (chronic obstructive pulmonary disease) ([place]) 01/04/2018  . Pressure injury of skin 10/25/2017  . Absolute anemia 03/15/2015  . Anxiety  03/15/2015  . Arthritis 03/15/2015  . Cataract 03/15/2015  . Chronic constipation 03/15/2015  . Blood clotting disorder ([place]) 03/15/2015  . CAFL (chronic airflow limitation) ([place]) 03/15/2015  . Clinical depression 03/15/2015  . Diabetes ([place]) 03/15/2015  . H/O varicella 03/15/2015  . BP (high blood pressure) 03/15/2015  . Mixed incontinence 03/15/2015  . 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 Adiposity 03/15/2015  . Apnea, sleep 03/15/2015  . Hypothyroidism 03/15/2015  . FOM (frequency of micturition) 03/15/2015  . 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 Candida vaginitis 03/15/2015  . Abnormal ECG 03/15/2015      1. Acute on chronic respiratory failure with hypoxia patient is on T collar doing well has been on 35% FiO2. 2. Severe COPD we will continue with medical management nebulizers as deemed necessary 3. Chronic diastolic heart failure monitor fluid status diuresis tolerated 4. Acute delirium she is doing a little bit better we will continue to monitor   I have personally evaluated the patient, evaluated the laboratory and imaging results and formulated the assessment and plan and placed orders as needed. The Patient requires high complexity decision making with multiple system involvement. Rounds were done with the Respiratory Therapy Director and respiratory therapist involved in the care of the patient as well as nursing staff.   Allyne Gee, MD [facility] Pulmonary Critical Care Medicine   This note is for inpatient care

## 2021-03-23 ENCOUNTER — Other Ambulatory Visit: Payer: Self-pay | Admitting: Internal Medicine

## 2021-03-23 NOTE — Progress Notes (Signed)
[place]  PROGRESS NOTE  PULMONARY SERVICE ROUNDS   DONYA HITCH  DOB: 1941/05/12  Referring physician: Deanne Coffer, MD  HPI: CASARA PERRIER is a 80 y.o. female  being seen for Acute on Chronic Respiratory Failure.  Patient is capping still has copious secretions noted.  Review of Systems: Unremarkable other than noted in HPI  Allergies:  Reviewed on the [facility]  Medications: Reviewed  Vitals: Temperature is 96.4 pulse 72 respiratory 22 blood pressure 139/75 saturation 95%  Ventilator Settings: Capping of the ventilator  Physical Exam: . General:  calm and comfortable NAD . Eyes: normal lids, irises & conjunctiva . ENT: grossly normal tongue not enlarged . Neck: no masses . Cardiovascular: S1 S2 Normal no rubs no gallop . Respiratory: Scattered rhonchi expansion is equal . Abdomen: soft non-distended . Skin: no rash seen on limited exam . Musculoskeletal:  no rigidity . Psychiatric: unable to assess . Neurologic: no involuntary movements          Lab Data and radiological Data:  Labs have been reviewed   Assessment/Plan  Patient Active Problem List   Diagnosis Date Noted  . Chronic respiratory failure with hypoxia ([place]) 08/05/2020  . Closed left hip fracture, initial encounter ([place]) 08/05/2020  . Preoperative clearance 08/05/2020  . Osteoporotic fracture of left hip, initial encounter ([place]) 08/05/2020  . Shortness of breath   . Palliative care by specialist   . DNR (do not resuscitate)   . Acute on chronic renal insufficiency   . Adult failure to thrive   . C. difficile colitis 01/16/2020  . Perirectal abscess 06/02/2018  . Chronic diastolic heart failure ([place]) 05/12/2018  . NSTEMI (non-ST elevated myocardial infarction) ([place]) 04/15/2018  . COPD (chronic obstructive pulmonary disease) ([place]) 01/04/2018  . Pressure injury of skin 10/25/2017  . Absolute anemia 03/15/2015  . Anxiety 03/15/2015  . Arthritis 03/15/2015  . Cataract  03/15/2015  . Chronic constipation 03/15/2015  . Blood clotting disorder ([place]) 03/15/2015  . CAFL (chronic airflow limitation) ([place]) 03/15/2015  . Clinical depression 03/15/2015  . Diabetes ([place]) 03/15/2015  . H/O varicella 03/15/2015  . BP (high blood pressure) 03/15/2015  . Mixed incontinence 03/15/2015  . 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 Adiposity 03/15/2015  . Apnea, sleep 03/15/2015  . Hypothyroidism 03/15/2015  . FOM (frequency of micturition) 03/15/2015  . 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 Candida vaginitis 03/15/2015  . Abnormal ECG 03/15/2015      1. Acute on chronic respiratory failure with hypoxia patient is doing well with capping plan is going to be to continue to advance on capping trials as ordered 2. Severe COPD continue with medical management nebulizers as necessary 3. Chronic diastolic heart failure follow fluid status 4. Acute delirium no change   I have personally evaluated the patient, evaluated the laboratory and imaging results and formulated the assessment and plan and placed orders as needed. The Patient requires high complexity decision making with multiple system involvement. Rounds were done with the Respiratory Therapy Director and respiratory therapist involved in the care of the patient as well as nursing staff.   Allyne Gee, MD [facility] Pulmonary Critical Care Medicine   This note is for inpatient care

## 2021-03-24 ENCOUNTER — Other Ambulatory Visit: Payer: Self-pay | Admitting: Internal Medicine

## 2021-03-24 ENCOUNTER — Encounter: Payer: Self-pay | Admitting: Internal Medicine

## 2021-03-24 DIAGNOSIS — I5032 Chronic diastolic (congestive) heart failure: Secondary | ICD-10-CM

## 2021-03-24 DIAGNOSIS — R410 Disorientation, unspecified: Secondary | ICD-10-CM

## 2021-03-24 DIAGNOSIS — J449 Chronic obstructive pulmonary disease, unspecified: Secondary | ICD-10-CM

## 2021-03-24 DIAGNOSIS — J9621 Acute and chronic respiratory failure with hypoxia: Secondary | ICD-10-CM | POA: Insufficient documentation

## 2021-03-24 NOTE — Progress Notes (Signed)
[place]  PROGRESS NOTE  PULMONARY SERVICE ROUNDS   Sara Pierce  DOB: 07-07-1941  Referring physician: Deanne Coffer, MD  HPI: Sara Pierce is a 80 y.o. female  being seen for Acute on Chronic Respiratory Failure.  She is awake and alert moderate amount of secretions are still being reportedly suction.  Her husband was present in the room and he was updated today  Review of Systems: Unremarkable other than noted in HPI  Allergies:  Reviewed on the [facility]  Medications: Reviewed  Vitals: Temperature is 97.6 pulse 78 respiratory rate 20 blood pressure is 146/71 saturations 95%  Ventilator Settings: Capping oxygen therapy  Physical Exam: . General:  calm and comfortable NAD . Eyes: normal lids, irises & conjunctiva . ENT: grossly normal tongue not enlarged . Neck: no masses . Cardiovascular: S1 S2 Normal no rubs no gallop . Respiratory: No rhonchi no rales are noted at this time . Abdomen: soft non-distended . Skin: no rash seen on limited exam . Musculoskeletal:  no rigidity . Psychiatric: unable to assess . Neurologic: no involuntary movements          Lab Data and radiological Data:  Labs have been reviewed   Assessment/Plan  Patient Active Problem List   Diagnosis Date Noted  . Acute on chronic respiratory failure with hypoxia ([place])   . COPD, severe ([place])   . Chronic diastolic heart failure ([place])   . Acute delirium       1. Acute on chronic respiratory failure with hypoxia she is off the ventilator capping doing fairly well.  Plan is going to be to continue to advance to capping trials. 2. Severe COPD we will continue with medical management. 3. Chronic diastolic heart failure peers to be better compensated 4. Acute delirium she is awake and alert at this time.   I have personally evaluated the patient, evaluated the laboratory and imaging results and formulated the assessment and plan and placed orders as needed. The Patient  requires high complexity decision making with multiple system involvement. Rounds were done with the Respiratory Therapy Director and respiratory therapist involved in the care of the patient as well as nursing staff.   Allyne Gee, MD [facility] Pulmonary Critical Care Medicine   This note is for inpatient care

## 2021-03-25 ENCOUNTER — Other Ambulatory Visit (HOSPITAL_COMMUNITY): Payer: Medicare Other | Admitting: Internal Medicine

## 2021-03-25 DIAGNOSIS — I5032 Chronic diastolic (congestive) heart failure: Secondary | ICD-10-CM | POA: Diagnosis not present

## 2021-03-25 DIAGNOSIS — R410 Disorientation, unspecified: Secondary | ICD-10-CM | POA: Diagnosis not present

## 2021-03-25 DIAGNOSIS — J9621 Acute and chronic respiratory failure with hypoxia: Secondary | ICD-10-CM | POA: Diagnosis not present

## 2021-03-25 DIAGNOSIS — J449 Chronic obstructive pulmonary disease, unspecified: Secondary | ICD-10-CM | POA: Diagnosis not present

## 2021-03-25 NOTE — Progress Notes (Signed)
[place]  PROGRESS NOTE  PULMONARY SERVICE ROUNDS   LAMYIAH CRAWSHAW  DOB: April 07, 1941  Referring physician: Deanne Coffer, MD  HPI: Sara Pierce is a 80 y.o. female  being seen for Acute on Chronic Respiratory Failure.  Patient is on T collar on 35% FiO2 with the PMV  Review of Systems: Unremarkable other than noted in HPI  Allergies:  Reviewed on the [facility]  Medications: Reviewed  Vitals: Temperature is 97.4 pulse 89 respiratory rate 20 blood pressure is 110/62 saturations 99%  Ventilator Settings: T collar FiO2 35% with PMV  Physical Exam: . General:  calm and comfortable NAD . Eyes: normal lids, irises & conjunctiva . ENT: grossly normal tongue not enlarged . Neck: no masses . Cardiovascular: S1 S2 Normal no rubs no gallop . Respiratory: Scattered rhonchi expansion is equal . Abdomen: soft non-distended . Skin: no rash seen on limited exam . Musculoskeletal:  no rigidity . Psychiatric: unable to assess . Neurologic: no involuntary movements          Lab Data and radiological Data:  Labs have been reviewed   Assessment/Plan  Patient Active Problem List   Diagnosis Date Noted  . Acute on chronic respiratory failure with hypoxia ([place])   . COPD, severe ([place])   . Chronic diastolic heart failure ([place])   . Acute delirium       1. Acute on chronic respiratory failure with hypoxia the patient right now is doing fairly well has been on T collar 35% with the PMV however has copious secretions noted.  Patient will be cultured. 2. Severe COPD medical management we will continue to follow along. 3. Chronic diastolic heart failure right now is at baseline 4. Acute delirium no change we will continue to follow along.   I have personally evaluated the patient, evaluated the laboratory and imaging results and formulated the assessment and plan and placed orders as needed. The Patient requires high complexity decision making with multiple system  involvement. Rounds were done with the Respiratory Therapy Director and respiratory therapist involved in the care of the patient as well as nursing staff.   Allyne Gee, MD [facility] Pulmonary Critical Care Medicine   This note is for inpatient care

## 2021-03-26 ENCOUNTER — Other Ambulatory Visit: Payer: Self-pay | Admitting: Internal Medicine

## 2021-03-26 NOTE — Progress Notes (Signed)
[place]  PROGRESS NOTE  PULMONARY SERVICE ROUNDS   Sara Pierce  DOB: [birth year]  Referring physician: Deanne Coffer, MD  HPI: Sara Pierce is a 80 y.o. female  being seen for Acute on Chronic Respiratory Failure.  Patient is comfortable right now without distress at this time has been afebrile still with copious secretions but continues to tolerate capping  Review of Systems: Unremarkable other than noted in HPI  Allergies:  Reviewed on the [facility]  Medications: Reviewed  Vitals: Temperature is 98.1 pulse 69 respiratory rate is 21 blood pressure 114/62 saturations 97%  Ventilator Settings: Capping off the ventilator  Physical Exam: . General:  calm and comfortable NAD . Eyes: normal lids, irises & conjunctiva . ENT: grossly normal tongue not enlarged . Neck: no masses . Cardiovascular: S1 S2 Normal no rubs no gallop . Respiratory: No rhonchi no rales are noted at this time. . Abdomen: soft non-distended . Skin: no rash seen on limited exam . Musculoskeletal:  no rigidity . Psychiatric: unable to assess . Neurologic: no involuntary movements          Lab Data and radiological Data:  Labs have been reviewed   Assessment/Plan  Patient Active Problem List   Diagnosis Date Noted  . Acute on chronic respiratory failure with hypoxia ([place])   . COPD, severe ([place])   . Chronic diastolic heart failure ([place])   . Acute delirium       1. Acute on chronic respiratory failure hypoxia we will continue with capping hold off on decannulation she is still requiring suctioning. 2. Severe COPD medical changes with medical therapy continue supportive care. 3. Chronic diastolic heart failure appears to be compensated 4. Acute delirium no change we will continue to monitor closely.   I have personally evaluated the patient, evaluated the laboratory and imaging results and formulated the assessment and plan and placed orders as needed. The Patient requires  high complexity decision making with multiple system involvement. Rounds were done with the Respiratory Therapy Director and respiratory therapist involved in the care of the patient as well as nursing staff.   Allyne Gee, MD [facility] Pulmonary Critical Care Medicine   This note is for inpatient care

## 2021-03-27 ENCOUNTER — Other Ambulatory Visit: Payer: Self-pay | Admitting: Internal Medicine

## 2021-03-27 NOTE — Progress Notes (Signed)
[place]  PROGRESS NOTE  PULMONARY SERVICE ROUNDS   Sara Pierce  DOB: 15-Sep-1941  Referring physician: Deanne Coffer, MD  HPI: Sara Pierce is a 80 y.o. female  being seen for Acute on Chronic Respiratory Failure.  Patient has been resting comfortably on T collar oxygen is at 35% FiO2 which has been decreased.  Review of Systems: Unremarkable other than noted in HPI  Allergies:  Reviewed on the [facility]  Medications: Reviewed  Vitals: Temperature is 97.7 pulse 71 respiratory 18 blood pressure is 112/80 saturations 96%  Ventilator Settings: Off the ventilator on T collar FiO2 35%  Physical Exam: . General:  calm and comfortable NAD . Eyes: normal lids, irises & conjunctiva . ENT: grossly normal tongue not enlarged . Neck: no masses . Cardiovascular: S1 S2 Normal no rubs no gallop . Respiratory: No rhonchi no rales are noted at this time. . Abdomen: soft non-distended . Skin: no rash seen on limited exam . Musculoskeletal:  no rigidity . Psychiatric: unable to assess . Neurologic: no involuntary movements          Lab Data and radiological Data:  Labs have been reviewed   Assessment/Plan  Patient Active Problem List   Diagnosis Date Noted  . Acute on chronic respiratory failure with hypoxia ([place])   . COPD, severe ([place])   . Chronic diastolic heart failure ([place])   . Acute delirium       1. Acute on chronic respiratory failure hypoxia plan is to continue with T collar patient is on 35% FiO2.  Has had copious amounts of secretions noted.  We will continue with secretion management supportive care 2. Severe COPD medical management 3. Chronic diastolic heart failure compensated at this time 4. Acute delirium no change   I have personally evaluated the patient, evaluated the laboratory and imaging results and formulated the assessment and plan and placed orders as needed. The Patient requires high complexity decision making with multiple  system involvement. Rounds were done with the Respiratory Therapy Director and respiratory therapist involved in the care of the patient as well as nursing staff.   Allyne Gee, MD [facility] Pulmonary Critical Care Medicine   This note is for inpatient care

## 2021-03-31 ENCOUNTER — Other Ambulatory Visit (HOSPITAL_COMMUNITY): Payer: Medicare Other | Admitting: Internal Medicine

## 2021-03-31 DIAGNOSIS — J9621 Acute and chronic respiratory failure with hypoxia: Secondary | ICD-10-CM

## 2021-03-31 DIAGNOSIS — I5032 Chronic diastolic (congestive) heart failure: Secondary | ICD-10-CM

## 2021-03-31 DIAGNOSIS — R410 Disorientation, unspecified: Secondary | ICD-10-CM

## 2021-03-31 DIAGNOSIS — J449 Chronic obstructive pulmonary disease, unspecified: Secondary | ICD-10-CM

## 2021-03-31 NOTE — Progress Notes (Signed)
[place]  PROGRESS NOTE  PULMONARY SERVICE ROUNDS   Sara Pierce  DOB: Apr 12, 1941  Referring physician: Deanne Coffer, MD  HPI: Sara Pierce is a 80 y.o. female  being seen for Acute on Chronic Respiratory Failure.  Capping right now.  She has been having some desaturations at nighttime while capping so suggested that we try CPAP at nighttime think she likely has obstructive sleep apnea  Review of Systems: Unremarkable other than noted in HPI  Allergies:  Reviewed on the [facility]  Medications: Reviewed  Vitals: Temperature is 97.6 pulse 120 respiratory 18 blood pressure is 141/75 saturations 92%  Ventilator Settings: Capping of the vent  Physical Exam: . General:  calm and comfortable NAD . Eyes: normal lids, irises & conjunctiva . ENT: grossly normal tongue not enlarged . Neck: no masses . Cardiovascular: S1 S2 Normal no rubs no gallop . Respiratory: No rhonchi no rales are noted. . Abdomen: soft non-distended . Skin: no rash seen on limited exam . Musculoskeletal:  no rigidity . Psychiatric: unable to assess . Neurologic: no involuntary movements          Lab Data and radiological Data:  Labs have been reviewed   Assessment/Plan  Patient Active Problem List   Diagnosis Date Noted  . Acute on chronic respiratory failure with hypoxia ([place])   . COPD, severe ([place])   . Chronic diastolic heart failure ([place])   . Acute delirium       1. Acute on chronic respiratory failure hypoxia plan is to continue with capping trials and start using the BiPAP at nighttime for possible obstructive sleep apnea 2. Severe COPD medical management continue to follow 3. Chronic diastolic heart failure remains compensated 4. Acute delirium she is at her baseline   I have personally evaluated the patient, evaluated the laboratory and imaging results and formulated the assessment and plan and placed orders as needed. The Patient requires high complexity decision  making with multiple system involvement. Rounds were done with the Respiratory Therapy Director and respiratory therapist involved in the care of the patient as well as nursing staff.   Allyne Gee, MD [facility] Pulmonary Critical Care Medicine   This note is for inpatient care

## 2021-04-01 ENCOUNTER — Other Ambulatory Visit (HOSPITAL_COMMUNITY): Payer: Medicare Other | Admitting: Internal Medicine

## 2021-04-01 DIAGNOSIS — J449 Chronic obstructive pulmonary disease, unspecified: Secondary | ICD-10-CM | POA: Diagnosis not present

## 2021-04-01 DIAGNOSIS — I5032 Chronic diastolic (congestive) heart failure: Secondary | ICD-10-CM | POA: Diagnosis not present

## 2021-04-01 DIAGNOSIS — J9621 Acute and chronic respiratory failure with hypoxia: Secondary | ICD-10-CM | POA: Diagnosis not present

## 2021-04-01 DIAGNOSIS — R410 Disorientation, unspecified: Secondary | ICD-10-CM | POA: Diagnosis not present

## 2021-04-01 NOTE — Progress Notes (Signed)
[place]  PROGRESS NOTE  PULMONARY SERVICE ROUNDS   Sara Pierce  DOB: 1941/02/26  Referring physician: Deanne Coffer, Sara Pierce  HPI: Sara Pierce is a 80 y.o. female  being seen for Acute on Chronic Respiratory Failure.  She did use the BiPAP as ordered yesterday and apparently was able to tolerate it quite well.  She also remained capping  Review of Systems: Unremarkable other than noted in HPI  Allergies:  Reviewed on the [facility]  Medications: Reviewed  Vitals: Temperature is 96.7 pulse 85 respiratory 22 blood pressure is 134/72 saturations 96%  Ventilator Settings: Capping right now off the ventilator.  Physical Exam: . General:  calm and comfortable NAD . Eyes: normal lids, irises & conjunctiva . ENT: grossly normal tongue not enlarged . Neck: no masses . Cardiovascular: S1 S2 Normal no rubs no gallop . Respiratory: No rhonchi no rales are noted at this time . Abdomen: soft non-distended . Skin: no rash seen on limited exam . Musculoskeletal:  no rigidity . Psychiatric: unable to assess . Neurologic: no involuntary movements          Lab Data and radiological Data:  Labs have been reviewed   Assessment/Plan  Patient Active Problem List   Diagnosis Date Noted  . Acute on chronic respiratory failure with hypoxia ([place])   . COPD, severe ([place])   . Chronic diastolic heart failure ([place])   . Acute delirium       1. Acute on chronic respiratory failure hypoxia plan is going to be to continue with capping and use the BiPAP at nighttime while patient is asleep for suspected sleep apnea. 2. Severe COPD we will continue with medical management 3. Chronic diastolic heart failure compensated at this time 4. Acute delirium no change we will continue to follow along.   I have personally evaluated the patient, evaluated the laboratory and imaging results and formulated the assessment and plan and placed orders as needed. The Patient requires high  complexity decision making with multiple system involvement. Rounds were done with the Respiratory Therapy Director and respiratory therapist involved in the care of the patient as well as nursing staff.   Sara Pierce, [facility] Pulmonary Critical Care Medicine   This note is for inpatient care

## 2021-04-02 ENCOUNTER — Other Ambulatory Visit (HOSPITAL_COMMUNITY): Payer: Medicare Other | Admitting: Internal Medicine

## 2021-04-02 DIAGNOSIS — I5032 Chronic diastolic (congestive) heart failure: Secondary | ICD-10-CM

## 2021-04-02 DIAGNOSIS — J449 Chronic obstructive pulmonary disease, unspecified: Secondary | ICD-10-CM | POA: Diagnosis not present

## 2021-04-02 DIAGNOSIS — R410 Disorientation, unspecified: Secondary | ICD-10-CM | POA: Diagnosis not present

## 2021-04-02 DIAGNOSIS — J9621 Acute and chronic respiratory failure with hypoxia: Secondary | ICD-10-CM

## 2021-04-02 NOTE — Progress Notes (Signed)
Sara Pierce  PROGRESS NOTE  PULMONARY SERVICE ROUNDS   Sara Pierce  DOB: 23-Jun-1941  Referring physician: Deanne Coffer, MD  HPI: Sara Pierce is a 80 y.o. female  being seen for Acute on Chronic Respiratory Failure.  Patient is capping right now we started on BiPAP seems to be tolerating it the plan is going to be to try to advance the BiPAP time as tolerated.  Secretions still remain an issue however  Review of Systems: Unremarkable other than noted in HPI  Allergies:  Reviewed on the [facility]  Medications: Reviewed  Vitals: Temperature is 97.1 pulse 83 respiratory rate is 20 blood pressure is 135/74 saturations 99%  Ventilator Settings: Capping right now off the vent  Physical Exam: . General:  calm and comfortable NAD . Eyes: normal lids, irises & conjunctiva . ENT: grossly normal tongue not enlarged . Neck: no masses . Cardiovascular: S1 S2 Normal no rubs no gallop . Respiratory: No rhonchi no rales noted at this time. . Abdomen: soft non-distended . Skin: no rash seen on limited exam . Musculoskeletal:  no rigidity . Psychiatric: unable to assess . Neurologic: no involuntary movements          Lab Data and radiological Data:  Labs have been reviewed   Assessment/Plan  Patient Active Problem List   Diagnosis Date Noted  . Acute on chronic respiratory failure with hypoxia ([place])   . COPD, severe ([place])   . Chronic diastolic heart failure ([place])   . Acute delirium       1. Acute on chronic respiratory failure with hypoxia we will continue with capping as ordered and use the BiPAP while asleep at nighttime. 2. Severe COPD medical management we will continue to follow along. 3. Chronic diastolic heart failure patient is compensated at this time. 4. Acute delirium improved significantly   I have personally evaluated the patient, evaluated the laboratory and imaging results and formulated the assessment and plan and placed orders as  needed. The Patient requires high complexity decision making with multiple system involvement. Rounds were done with the Respiratory Therapy Director and respiratory therapist involved in the care of the patient as well as nursing staff.   Allyne Gee, MD [facility] Pulmonary Critical Care Medicine   This note is for inpatient care

## 2021-04-03 ENCOUNTER — Other Ambulatory Visit (HOSPITAL_COMMUNITY): Payer: Medicare Other | Admitting: Internal Medicine

## 2021-04-03 DIAGNOSIS — I5032 Chronic diastolic (congestive) heart failure: Secondary | ICD-10-CM

## 2021-04-03 DIAGNOSIS — R410 Disorientation, unspecified: Secondary | ICD-10-CM | POA: Diagnosis not present

## 2021-04-03 DIAGNOSIS — J449 Chronic obstructive pulmonary disease, unspecified: Secondary | ICD-10-CM | POA: Diagnosis not present

## 2021-04-03 DIAGNOSIS — J9621 Acute and chronic respiratory failure with hypoxia: Secondary | ICD-10-CM | POA: Diagnosis not present

## 2021-04-03 NOTE — Progress Notes (Signed)
[place]  PROGRESS NOTE  PULMONARY SERVICE ROUNDS   Sara Pierce  DOB: [birth year]  Referring physician: Deanne Coffer, MD  HPI: Sara Pierce is a 80 y.o. female  being seen for Acute on Chronic Respiratory Failure.  Patient is capping has been on 4 L oxygen using the BiPAP at nighttime  Review of Systems: Unremarkable other than noted in HPI  Allergies:  Reviewed on the [facility]  Medications: Reviewed  Vitals: Temperature is 97.9 pulse 80 respiratory 20 blood pressure is 150/74 saturations 100%  Ventilator Settings: Capping off the ventilator  Physical Exam: . General:  calm and comfortable NAD . Eyes: normal lids, irises & conjunctiva . ENT: grossly normal tongue not enlarged . Neck: no masses . Cardiovascular: S1 S2 Normal no rubs no gallop . Respiratory: No rhonchi very coarse breath sounds . Abdomen: soft non-distended . Skin: no rash seen on limited exam . Musculoskeletal:  no rigidity . Psychiatric: unable to assess . Neurologic: no involuntary movements          Lab Data and radiological Data:  Labs have been reviewed   Assessment/Plan  Patient Active Problem List   Diagnosis Date Noted  . Acute on chronic respiratory failure with hypoxia ([place])   . COPD, severe ([place])   . Chronic diastolic heart failure ([place])   . Acute delirium       1. Acute on chronic respiratory failure hypoxia plan is to continue with capping trials titrate oxygen as tolerated continue pulmonary toilet. 2. Severe COPD medical management nebulizers as needed 3. Chronic diastolic heart failure compensated 4. Acute tolerating no change we will continue to monitor closely   I have personally evaluated the patient, evaluated the laboratory and imaging results and formulated the assessment and plan and placed orders as needed. The Patient requires high complexity decision making with multiple system involvement. Rounds were done with the Respiratory Therapy  Director and respiratory therapist involved in the care of the patient as well as nursing staff.   Allyne Gee, MD [facility] Pulmonary Critical Care Medicine   This note is for inpatient care

## 2021-04-06 ENCOUNTER — Other Ambulatory Visit (HOSPITAL_COMMUNITY): Payer: Medicare Other | Admitting: Internal Medicine

## 2021-04-06 DIAGNOSIS — J449 Chronic obstructive pulmonary disease, unspecified: Secondary | ICD-10-CM | POA: Diagnosis not present

## 2021-04-06 DIAGNOSIS — R410 Disorientation, unspecified: Secondary | ICD-10-CM

## 2021-04-06 DIAGNOSIS — J9621 Acute and chronic respiratory failure with hypoxia: Secondary | ICD-10-CM

## 2021-04-06 DIAGNOSIS — I5032 Chronic diastolic (congestive) heart failure: Secondary | ICD-10-CM | POA: Diagnosis not present

## 2021-04-06 NOTE — Progress Notes (Signed)
[place]  PROGRESS NOTE  PULMONARY SERVICE ROUNDS   Sara Pierce  DOB: 1941-03-10  Referring physician: Deanne Coffer, MD  HPI: Sara Pierce is a 80 y.o. female  being seen for Acute on Chronic Respiratory Failure.  Patient right now is on 4 L of oxygen and using the BiPAP at nighttime she still has issues with weak cough and secretions  Review of Systems: Unremarkable other than noted in HPI  Allergies:  Reviewed on the [facility]  Medications: Reviewed  Vitals: Temperature is 97.4 pulse 95 respiratory 18 blood pressure is 122/65 saturations 97%  Ventilator Settings: On 4 L capping  Physical Exam: . General:  calm and comfortable NAD . Eyes: normal lids, irises & conjunctiva . ENT: grossly normal tongue not enlarged . Neck: no masses . Cardiovascular: S1 S2 Normal no rubs no gallop . Respiratory: No rhonchi no rales are noted at this time . Abdomen: soft non-distended . Skin: no rash seen on limited exam . Musculoskeletal:  no rigidity . Psychiatric: unable to assess . Neurologic: no involuntary movements          Lab Data and radiological Data:  Labs have been reviewed   Assessment/Plan  Patient Active Problem List   Diagnosis Date Noted  . Acute on chronic respiratory failure with hypoxia ([place])   . COPD, severe ([place])   . Chronic diastolic heart failure ([place])   . Acute delirium       1. Acute on chronic respiratory failure with hypoxia plan is to continue with capping trials and use the BiPAP at nighttime. 2. Severe COPD medical management 3. Chronic diastolic heart failure no change we will continue to follow along 4. Acute delirium she remains waxing and waning status at nighttime she is slightly worse   I have personally evaluated the patient, evaluated the laboratory and imaging results and formulated the assessment and plan and placed orders as needed. The Patient requires high complexity decision making with multiple system  involvement. Rounds were done with the Respiratory Therapy Director and respiratory therapist involved in the care of the patient as well as nursing staff.   Allyne Gee, MD [facility] Pulmonary Critical Care Medicine   This note is for inpatient care

## 2021-04-07 ENCOUNTER — Other Ambulatory Visit (HOSPITAL_COMMUNITY): Payer: Medicare Other | Admitting: Internal Medicine

## 2021-04-07 DIAGNOSIS — R410 Disorientation, unspecified: Secondary | ICD-10-CM | POA: Diagnosis not present

## 2021-04-07 DIAGNOSIS — J9621 Acute and chronic respiratory failure with hypoxia: Secondary | ICD-10-CM

## 2021-04-07 DIAGNOSIS — J449 Chronic obstructive pulmonary disease, unspecified: Secondary | ICD-10-CM

## 2021-04-07 DIAGNOSIS — I5032 Chronic diastolic (congestive) heart failure: Secondary | ICD-10-CM

## 2021-04-07 NOTE — Progress Notes (Signed)
[place]  PROGRESS NOTE  PULMONARY SERVICE ROUNDS   Sara Pierce  DOB: [birth year]  Referring physician: Deanne Coffer, MD  HPI: Sara Pierce is a 80 y.o. female  being seen for Acute on Chronic Respiratory Failure.  Patient is capping right now has been comfortable without any distress at this time.  Review of Systems: Unremarkable other than noted in HPI  Allergies:  Reviewed on the [facility]  Medications: Reviewed  Vitals: Temperature is 96.9 pulse 69 respiratory 24 blood pressure is 139/70 saturations 100%  Ventilator Settings: Capping off the vent  Physical Exam: . General:  calm and comfortable NAD . Eyes: normal lids, irises & conjunctiva . ENT: grossly normal tongue not enlarged . Neck: no masses . Cardiovascular: S1 S2 Normal no rubs no gallop . Respiratory: Scattered rhonchi no rales . Abdomen: soft non-distended . Skin: no rash seen on limited exam . Musculoskeletal:  no rigidity . Psychiatric: unable to assess . Neurologic: no involuntary movements          Lab Data and radiological Data:  Labs have been reviewed   Assessment/Plan  Patient Active Problem List   Diagnosis Date Noted  . Acute on chronic respiratory failure with hypoxia ([place])   . COPD, severe ([place])   . Chronic diastolic heart failure ([place])   . Acute delirium       1. Acute on chronic respiratory failure with hypoxia for the most part she does fine with the capping at nighttime it appears that she is getting confused appears to be more like sundowning we will discuss with the primary team 2. Severe COPD medical management 3. Chronic diastolic heart failure compensated 4. Acute delirium no change we will continue to follow along   I have personally evaluated the patient, evaluated the laboratory and imaging results and formulated the assessment and plan and placed orders as needed. The Patient requires high complexity decision making with multiple system  involvement. Rounds were done with the Respiratory Therapy Director and respiratory therapist involved in the care of the patient as well as nursing staff.   Allyne Gee, MD [facility] Pulmonary Critical Care Medicine   This note is for inpatient care

## 2021-04-08 ENCOUNTER — Other Ambulatory Visit (HOSPITAL_COMMUNITY): Payer: Medicare Other | Admitting: Internal Medicine

## 2021-04-08 DIAGNOSIS — I5032 Chronic diastolic (congestive) heart failure: Secondary | ICD-10-CM

## 2021-04-08 DIAGNOSIS — R410 Disorientation, unspecified: Secondary | ICD-10-CM | POA: Diagnosis not present

## 2021-04-08 DIAGNOSIS — J9621 Acute and chronic respiratory failure with hypoxia: Secondary | ICD-10-CM

## 2021-04-08 DIAGNOSIS — J449 Chronic obstructive pulmonary disease, unspecified: Secondary | ICD-10-CM | POA: Diagnosis not present

## 2021-04-08 NOTE — Progress Notes (Signed)
[place]  PROGRESS NOTE  PULMONARY SERVICE ROUNDS   HARBOUR NORDMEYER  DOB: 19-Jun-1941  Referring physician: Deanne Coffer, MD  HPI: Sara Pierce is a 80 y.o. female  being seen for Acute on Chronic Respiratory Failure.  Patient had a ABG done which showed significant hypercapnia and she has going to need to use the BiPAP more regularly.  Apparently she has been refusing at nighttime based on the reports from respiratory therapy  Review of Systems: Unremarkable other than noted in HPI  Allergies:  Reviewed on the [facility]  Medications: Reviewed  Vitals: Temperature is 99.6 pulse 89 respiratory 20 blood pressure is 149/73 saturations 98%  Ventilator Settings: Currently on the BiPAP with supplemental oxygen  Physical Exam: . General:  calm and comfortable NAD . Eyes: normal lids, irises & conjunctiva . ENT: grossly normal tongue not enlarged . Neck: no masses . Cardiovascular: S1 S2 Normal no rubs no gallop . Respiratory: Scattered rhonchi are noted bilaterally . Abdomen: soft non-distended . Skin: no rash seen on limited exam . Musculoskeletal:  no rigidity . Psychiatric: unable to assess . Neurologic: no involuntary movements          Lab Data and radiological Data:  Labs have been reviewed   Assessment/Plan  Patient Active Problem List   Diagnosis Date Noted  . Acute on chronic respiratory failure with hypoxia ([place])   . COPD, severe ([place])   . Chronic diastolic heart failure ([place])   . Acute delirium       1. Acute on chronic respiratory failure with hypoxia she had significant increase in PCO2 so she will be continued on the BiPAP.  Try to encourage compliance with recommended therapy. 2. Severe COPD medical management 3. Chronic diastolic heart failure compensated 4. Acute delirium we will continue to monitor closely.   I have personally evaluated the patient, evaluated the laboratory and imaging results and formulated the assessment and  plan and placed orders as needed. The Patient requires high complexity decision making with multiple system involvement. Rounds were done with the Respiratory Therapy Director and respiratory therapist involved in the care of the patient as well as nursing staff.   Allyne Gee, MD [facility] Pulmonary Critical Care Medicine   This note is for inpatient care

## 2021-04-09 ENCOUNTER — Other Ambulatory Visit (HOSPITAL_COMMUNITY): Payer: Medicare Other | Admitting: Internal Medicine

## 2021-04-09 DIAGNOSIS — I5032 Chronic diastolic (congestive) heart failure: Secondary | ICD-10-CM

## 2021-04-09 DIAGNOSIS — J9621 Acute and chronic respiratory failure with hypoxia: Secondary | ICD-10-CM

## 2021-04-09 DIAGNOSIS — J449 Chronic obstructive pulmonary disease, unspecified: Secondary | ICD-10-CM

## 2021-04-09 DIAGNOSIS — R410 Disorientation, unspecified: Secondary | ICD-10-CM

## 2021-04-09 NOTE — Progress Notes (Signed)
[place]  PROGRESS NOTE  PULMONARY SERVICE ROUNDS   Sara Pierce  DOB: 03-20-1941  Referring physician: Deanne Coffer, MD  HPI: Sara Pierce is a 80 y.o. female  being seen for Acute on Chronic Respiratory Failure.  The patient is on T collar again today is on 40% FiO2 had a slight setback overnight appears  Review of Systems: Unremarkable other than noted in HPI  Allergies:  Reviewed on the [facility]  Medications: Reviewed  Vitals: Temperature is 98.3 pulse 90 respiratory rate 18 blood pressure is 127/70 saturations 94  Ventilator Settings: On T collar with an FiO2 of 40%  Physical Exam: . General:  calm and comfortable NAD . Eyes: normal lids, irises & conjunctiva . ENT: grossly normal tongue not enlarged . Neck: no masses . Cardiovascular: S1 S2 Normal no rubs no gallop . Respiratory: No rhonchi no rales are noted at this time . Abdomen: soft non-distended . Skin: no rash seen on limited exam . Musculoskeletal:  no rigidity . Psychiatric: unable to assess . Neurologic: no involuntary movements          Lab Data and radiological Data:  Labs have been reviewed   Assessment/Plan  Patient Active Problem List   Diagnosis Date Noted  . Acute on chronic respiratory failure with hypoxia ([place])   . COPD, severe ([place])   . Chronic diastolic heart failure ([place])   . Acute delirium       1. Acute on chronic respiratory failure with hypoxia patient is currently on T collar on 40% FiO2. 2. Severe COPD medical management will be continued. 3. Chronic diastolic heart failure appears to be compensated 4. Acute delirium no change still remains confused at night   I have personally evaluated the patient, evaluated the laboratory and imaging results and formulated the assessment and plan and placed orders as needed. The Patient requires high complexity decision making with multiple system involvement. Rounds were done with the Respiratory Therapy Director  and respiratory therapist involved in the care of the patient as well as nursing staff.   Allyne Gee, MD [facility] Pulmonary Critical Care Medicine   This note is for inpatient care

## 2021-04-15 ENCOUNTER — Other Ambulatory Visit (HOSPITAL_COMMUNITY): Payer: Medicare Other | Admitting: Internal Medicine

## 2021-04-15 DIAGNOSIS — J449 Chronic obstructive pulmonary disease, unspecified: Secondary | ICD-10-CM

## 2021-04-15 DIAGNOSIS — I5032 Chronic diastolic (congestive) heart failure: Secondary | ICD-10-CM | POA: Diagnosis not present

## 2021-04-15 DIAGNOSIS — R410 Disorientation, unspecified: Secondary | ICD-10-CM | POA: Diagnosis not present

## 2021-04-15 DIAGNOSIS — J9621 Acute and chronic respiratory failure with hypoxia: Secondary | ICD-10-CM

## 2021-04-15 NOTE — Progress Notes (Signed)
[place]  PROGRESS NOTE  PULMONARY SERVICE ROUNDS   Sara Pierce  DOB: [birth year]  Referring physician: Deanne Coffer, MD  HPI: Sara Pierce is a 80 y.o. female  being seen for Acute on Chronic Respiratory Failure.  Patient is on the ventilator was able to do 4 hours of T collar yesterday now is on pressure support  Review of Systems: Unremarkable other than noted in HPI  Allergies:  Reviewed on the [facility]  Medications: Reviewed  Vitals: Temperature is 97 pulse 69 respiratory rate is 29 blood pressure 140/70 saturations 98%  Ventilator Settings: Pressure support FiO2 40% pressure 12/5  Physical Exam: . General:  calm and comfortable NAD . Eyes: normal lids, irises & conjunctiva . ENT: grossly normal tongue not enlarged . Neck: no masses . Cardiovascular: S1 S2 Normal no rubs no gallop . Respiratory: No rhonchi very coarse breath sounds . Abdomen: soft non-distended . Skin: no rash seen on limited exam . Musculoskeletal:  no rigidity . Psychiatric: unable to assess . Neurologic: no involuntary movements          Lab Data and radiological Data:  Labs have been reviewed   Assessment/Plan  Patient Active Problem List   Diagnosis Date Noted  . Acute on chronic respiratory failure with hypoxia ([place])   . COPD, severe ([place])   . Chronic diastolic heart failure ([place])   . Acute delirium       1. Acute on chronic respiratory failure hypoxia we will continue to try to wean on T collar again.  Had a setback last week 2. Severe COPD medical management 3. Chronic diastolic heart failure we will continue to monitor closely. 4. Acute delirium no change   I have personally evaluated the patient, evaluated the laboratory and imaging results and formulated the assessment and plan and placed orders as needed. The Patient requires high complexity decision making with multiple system involvement. Rounds were done with the Respiratory Therapy Director and  respiratory therapist involved in the care of the patient as well as nursing staff.   Allyne Gee, MD [facility] Pulmonary Critical Care Medicine   This note is for inpatient care

## 2021-04-16 ENCOUNTER — Other Ambulatory Visit (HOSPITAL_COMMUNITY): Payer: Medicare Other | Admitting: Internal Medicine

## 2021-04-16 DIAGNOSIS — J449 Chronic obstructive pulmonary disease, unspecified: Secondary | ICD-10-CM

## 2021-04-16 DIAGNOSIS — J9621 Acute and chronic respiratory failure with hypoxia: Secondary | ICD-10-CM

## 2021-04-16 DIAGNOSIS — I5032 Chronic diastolic (congestive) heart failure: Secondary | ICD-10-CM | POA: Diagnosis not present

## 2021-04-16 DIAGNOSIS — R410 Disorientation, unspecified: Secondary | ICD-10-CM

## 2021-04-16 NOTE — Progress Notes (Signed)
[place]  PROGRESS NOTE  PULMONARY SERVICE ROUNDS   Sara Pierce  DOB: [birth year]  Referring physician: Deanne Coffer, MD  HPI: Sara Pierce is a 80 y.o. female  being seen for Acute on Chronic Respiratory Failure.  She is on T collar right now has been on 45% FiO2 using the ventilator at night  Review of Systems: Unremarkable other than noted in HPI  Allergies:  Reviewed on the [facility]  Medications: Reviewed  Vitals: Temperature is 96.9 pulse 65 respiratory rate is 22 blood pressure 142/76 saturations 99%  Ventilator Settings: On T collar with an FiO2 of 45%  Physical Exam: . General:  calm and comfortable NAD . Eyes: normal lids, irises & conjunctiva . ENT: grossly normal tongue not enlarged . Neck: no masses . Cardiovascular: S1 S2 Normal no rubs no gallop . Respiratory: No rhonchi no rales noted at this time . Abdomen: soft non-distended . Skin: no rash seen on limited exam . Musculoskeletal:  no rigidity . Psychiatric: unable to assess . Neurologic: no involuntary movements          Lab Data and radiological Data:  Labs have been reviewed   Assessment/Plan  Patient Active Problem List   Diagnosis Date Noted  . Acute on chronic respiratory failure with hypoxia ([place])   . COPD, severe ([place])   . Chronic diastolic heart failure ([place])   . Acute delirium       1. Acute on chronic respiratory failure with hypoxia we will continue with the T collar during the daytime rest on the ventilator at nighttime 2. Severe COPD we will continue with medical management 3. Chronic diastolic heart failure no change 4. Acute delirium patient is at baseline   I have personally evaluated the patient, evaluated the laboratory and imaging results and formulated the assessment and plan and placed orders as needed. The Patient requires high complexity decision making with multiple system involvement. Rounds were done with the Respiratory Therapy Director and  respiratory therapist involved in the care of the patient as well as nursing staff.   Allyne Gee, MD [facility] Pulmonary Critical Care Medicine   This note is for inpatient care

## 2021-04-17 ENCOUNTER — Other Ambulatory Visit (HOSPITAL_COMMUNITY): Payer: Medicare Other | Admitting: Internal Medicine

## 2021-04-17 DIAGNOSIS — R410 Disorientation, unspecified: Secondary | ICD-10-CM | POA: Diagnosis not present

## 2021-04-17 DIAGNOSIS — J9621 Acute and chronic respiratory failure with hypoxia: Secondary | ICD-10-CM | POA: Diagnosis not present

## 2021-04-17 DIAGNOSIS — I5032 Chronic diastolic (congestive) heart failure: Secondary | ICD-10-CM | POA: Diagnosis not present

## 2021-04-17 DIAGNOSIS — J449 Chronic obstructive pulmonary disease, unspecified: Secondary | ICD-10-CM | POA: Diagnosis not present

## 2021-04-18 NOTE — Progress Notes (Signed)
[place]  PROGRESS NOTE  PULMONARY SERVICE ROUNDS   Sara Pierce  DOB: 07-06-1941  Referring physician: Deanne Coffer, MD  HPI: Sara Pierce is a 80 y.o. female  being seen for Acute on Chronic Respiratory Failure.  Patient is on T collar using PMV was placed on the ventilator at nighttime  Review of Systems: Unremarkable other than noted in HPI  Allergies:  Reviewed on the [facility]  Medications: Reviewed  Vitals: Temperature is 95.4 pulse 62 respiratory rate is 19 blood pressure is 134/67 saturations 97%  Ventilator Settings: On T collar  Physical Exam: . General:  calm and comfortable NAD . Eyes: normal lids, irises & conjunctiva . ENT: grossly normal tongue not enlarged . Neck: no masses . Cardiovascular: S1 S2 Normal no rubs no gallop . Respiratory: No rhonchi no rales are noted at this time . Abdomen: soft non-distended . Skin: no rash seen on limited exam . Musculoskeletal:  no rigidity . Psychiatric: unable to assess . Neurologic: no involuntary movements          Lab Data and radiological Data:  Labs have been reviewed   Assessment/Plan  Patient Active Problem List   Diagnosis Date Noted  . Acute on chronic respiratory failure with hypoxia ([place])   . COPD, severe ([place])   . Chronic diastolic heart failure ([place])   . Acute delirium       1. Acute on chronic respiratory failure hypoxia plan is to continue with the T-piece titrate oxygen as tolerated continue pulmonary toilet 2. Severe COPD medical management 3. Chronic diastolic heart failure patient is at baseline 4. Acute delirium no change   I have personally evaluated the patient, evaluated the laboratory and imaging results and formulated the assessment and plan and placed orders as needed. The Patient requires high complexity decision making with multiple system involvement. Rounds were done with the Respiratory Therapy Director and respiratory therapist involved in the care  of the patient as well as nursing staff.   Allyne Gee, MD [facility] Pulmonary Critical Care Medicine   This note is for inpatient care

## 2021-04-20 ENCOUNTER — Other Ambulatory Visit (HOSPITAL_COMMUNITY): Payer: Medicare Other | Admitting: Internal Medicine

## 2021-04-20 DIAGNOSIS — J449 Chronic obstructive pulmonary disease, unspecified: Secondary | ICD-10-CM | POA: Diagnosis not present

## 2021-04-20 DIAGNOSIS — I5032 Chronic diastolic (congestive) heart failure: Secondary | ICD-10-CM

## 2021-04-20 DIAGNOSIS — J9621 Acute and chronic respiratory failure with hypoxia: Secondary | ICD-10-CM

## 2021-04-20 DIAGNOSIS — R410 Disorientation, unspecified: Secondary | ICD-10-CM | POA: Diagnosis not present

## 2021-04-21 ENCOUNTER — Other Ambulatory Visit (HOSPITAL_COMMUNITY): Payer: Medicare Other | Admitting: Internal Medicine

## 2021-04-21 DIAGNOSIS — R410 Disorientation, unspecified: Secondary | ICD-10-CM | POA: Diagnosis not present

## 2021-04-21 DIAGNOSIS — J449 Chronic obstructive pulmonary disease, unspecified: Secondary | ICD-10-CM | POA: Diagnosis not present

## 2021-04-21 DIAGNOSIS — I5032 Chronic diastolic (congestive) heart failure: Secondary | ICD-10-CM

## 2021-04-21 DIAGNOSIS — J9621 Acute and chronic respiratory failure with hypoxia: Secondary | ICD-10-CM

## 2021-04-21 NOTE — Progress Notes (Signed)
[place]  PROGRESS NOTE  PULMONARY SERVICE ROUNDS   Sara Pierce  DOB: [birth year]  Referring physician: Deanne Coffer, MD  HPI: Sara Pierce is a 80 y.o. female  being seen for Acute on Chronic Respiratory Failure.  Patient is back on the ventilator and full support should be trying to wean again on T-piece  Review of Systems: Unremarkable other than noted in HPI  Allergies:  Reviewed on the [facility]  Medications: Reviewed  Vitals: Temperature is 99.2 pulse 77 respiratory 25 blood pressure is 116/47 saturations 99%  Ventilator Settings: On pressure assist control FiO2 is 45% tidal volume is 385 PEEP 6 IP 22  Physical Exam: . General:  calm and comfortable NAD . Eyes: normal lids, irises & conjunctiva . ENT: grossly normal tongue not enlarged . Neck: no masses . Cardiovascular: S1 S2 Normal no rubs no gallop . Respiratory: No rhonchi very coarse breath sounds . Abdomen: soft non-distended . Skin: no rash seen on limited exam . Musculoskeletal:  no rigidity . Psychiatric: unable to assess . Neurologic: no involuntary movements          Lab Data and radiological Data:  Data has been reviewed   Assessment/Plan  Patient Active Problem List   Diagnosis Date Noted  . Acute on chronic respiratory failure with hypoxia ([place])   . COPD, severe ([place])   . Chronic diastolic heart failure ([place])   . Acute delirium       1. Acute on chronic respiratory failure hypoxia we will continue with pressure assist control mode today respiratory therapy will assess the mechanics and try to wean once again. 2. Severe COPD medical management 3. Chronic diastolic heart failure compensated 4. Acute delirium no change we will continue to monitor closely.   I have personally evaluated the patient, evaluated the laboratory and imaging results and formulated the assessment and plan and placed orders as needed. The Patient requires high complexity decision making with  multiple system involvement. Rounds were done with the Respiratory Therapy Director and respiratory therapist involved in the care of the patient as well as nursing staff.   Allyne Gee, MD [facility] Pulmonary Critical Care Medicine   This note is for inpatient care

## 2021-04-21 NOTE — Progress Notes (Signed)
[place]  PROGRESS NOTE  PULMONARY SERVICE ROUNDS   Sara Pierce  DOB: 1941/08/05  Referring physician: Deanne Coffer, MD  HPI: Sara Pierce is a 80 y.o. female  being seen for Acute on Chronic Respiratory Failure.  Patient at this time is comfortable without distress at this time has been back on the ventilator question whether the patient may have had a seizure over the weekend  Review of Systems: Unremarkable other than noted in HPI  Allergies:  Reviewed on the [facility]  Medications: Reviewed  Vitals: Temperature is 99.3 pulse 64 respiratory 25 blood pressure is 100/48 saturations 98%  Ventilator Settings: Back on the ventilator and full support  Physical Exam: . General:  calm and comfortable NAD . Eyes: normal lids, irises & conjunctiva . ENT: grossly normal tongue not enlarged . Neck: no masses . Cardiovascular: S1 S2 Normal no rubs no gallop . Respiratory: Scattered rhonchi expansion is equal . Abdomen: soft non-distended . Skin: no rash seen on limited exam . Musculoskeletal:  no rigidity . Psychiatric: unable to assess . Neurologic: no involuntary movements          Lab Data and radiological Data:  Labs have been reviewed   Assessment/Plan  Patient Active Problem List   Diagnosis Date Noted  . Acute on chronic respiratory failure with hypoxia ([place])   . COPD, severe ([place])   . Chronic diastolic heart failure ([place])   . Acute delirium       1. Acute on chronic respiratory failure with hypoxia patient is going to continue on the ventilator for today we will reassess again tomorrow. 2. 3 COPD medical management we will continue to follow along. 3. Chronic diastolic heart failure patient is at baseline 4. Acute delirium no change we will continue with supportive care   I have personally evaluated the patient, evaluated the laboratory and imaging results and formulated the assessment and plan and placed orders as needed. The Patient  requires high complexity decision making with multiple system involvement. Rounds were done with the Respiratory Therapy Director and respiratory therapist involved in the care of the patient as well as nursing staff.   Allyne Gee, MD [facility] Pulmonary Critical Care Medicine   This note is for inpatient care

## 2021-04-22 ENCOUNTER — Other Ambulatory Visit (HOSPITAL_COMMUNITY): Payer: Medicare Other | Admitting: Internal Medicine

## 2021-04-22 DIAGNOSIS — R410 Disorientation, unspecified: Secondary | ICD-10-CM

## 2021-04-22 DIAGNOSIS — I5032 Chronic diastolic (congestive) heart failure: Secondary | ICD-10-CM

## 2021-04-22 DIAGNOSIS — J449 Chronic obstructive pulmonary disease, unspecified: Secondary | ICD-10-CM | POA: Diagnosis not present

## 2021-04-22 DIAGNOSIS — J9621 Acute and chronic respiratory failure with hypoxia: Secondary | ICD-10-CM

## 2021-04-22 NOTE — Progress Notes (Signed)
[place]  PROGRESS NOTE  PULMONARY SERVICE ROUNDS   Sara Pierce  DOB: [birth year]  Referring physician: Deanne Coffer, MD  HPI: Sara Pierce is a 80 y.o. female  being seen for Acute on Chronic Respiratory Failure.  She has been requiring the ventilator at nighttime this morning had not yet been taken off of the vent on pressure control mode  Review of Systems: Unremarkable other than noted in HPI  Allergies:  Reviewed on the [facility]  Medications: Reviewed  Vitals: Temperature is 98.5 pulse 78 respiratory 24 blood pressure is 129/65 saturations 98%  Ventilator Settings: On pressure assist control mode on FiO2 45% IP 24 with a PEEP of 5  Physical Exam: . General:  calm and comfortable NAD . Eyes: normal lids, irises & conjunctiva . ENT: grossly normal tongue not enlarged . Neck: no masses . Cardiovascular: S1 S2 Normal no rubs no gallop . Respiratory: No rhonchi very coarse breath sounds . Abdomen: soft non-distended . Skin: no rash seen on limited exam . Musculoskeletal:  no rigidity . Psychiatric: unable to assess . Neurologic: no involuntary movements          Lab Data and radiological Data:  Labs have been reviewed   Assessment/Plan  Patient Active Problem List   Diagnosis Date Noted  . Acute on chronic respiratory failure with hypoxia ([place])   . COPD, severe ([place])   . Chronic diastolic heart failure ([place])   . Acute delirium       1. Acute on chronic respiratory failure with hypoxia plan is to continue with the ventilator at nighttime and try weaning off on T-bar during the daytime. 2. Severe COPD we will continue with medical management 3. Chronic diastolic heart failure remains compensated 4. Acute delirium patient is showing some improvement we will continue to monitor closely   I have personally evaluated the patient, evaluated the laboratory and imaging results and formulated the assessment and plan and placed orders as  needed. The Patient requires high complexity decision making with multiple system involvement. Rounds were done with the Respiratory Therapy Director and respiratory therapist involved in the care of the patient as well as nursing staff.   Allyne Gee, MD [facility] Pulmonary Critical Care Medicine   This note is for inpatient care

## 2021-04-23 ENCOUNTER — Other Ambulatory Visit (HOSPITAL_COMMUNITY): Payer: Medicare Other | Admitting: Internal Medicine

## 2021-04-23 DIAGNOSIS — I5032 Chronic diastolic (congestive) heart failure: Secondary | ICD-10-CM

## 2021-04-23 DIAGNOSIS — J9621 Acute and chronic respiratory failure with hypoxia: Secondary | ICD-10-CM

## 2021-04-23 DIAGNOSIS — J449 Chronic obstructive pulmonary disease, unspecified: Secondary | ICD-10-CM | POA: Diagnosis not present

## 2021-04-23 DIAGNOSIS — R410 Disorientation, unspecified: Secondary | ICD-10-CM | POA: Diagnosis not present

## 2021-04-23 NOTE — Progress Notes (Signed)
[place]  PROGRESS NOTE  PULMONARY SERVICE ROUNDS   Sara Pierce  DOB: 02/19/1941  Referring physician: Deanne Coffer, MD  HPI: Sara Pierce is a 80 y.o. female  being seen for Acute on Chronic Respiratory Failure.  She is awake and alert appears to be comfortable right now without distress  Review of Systems: Unremarkable other than noted in HPI  Allergies:  Reviewed on the [facility]  Medications: Reviewed  Vitals: Temperature is 99.3 pulse 75 respiratory 24 blood pressure is 113/63 saturations 99%  Ventilator Settings: On pressure assist control FiO2 is 45% IP 22 PEEP 6  Physical Exam: . General:  calm and comfortable NAD . Eyes: normal lids, irises & conjunctiva . ENT: grossly normal tongue not enlarged . Neck: no masses . Cardiovascular: S1 S2 Normal no rubs no gallop . Respiratory: No rhonchi very coarse breath sounds . Abdomen: soft non-distended . Skin: no rash seen on limited exam . Musculoskeletal:  no rigidity . Psychiatric: unable to assess . Neurologic: no involuntary movements          Lab Data and radiological Data:  Data reviewed   Assessment/Plan  Patient Active Problem List   Diagnosis Date Noted  . Acute on chronic respiratory failure with hypoxia ([place])   . COPD, severe ([place])   . Chronic diastolic heart failure ([place])   . Acute delirium       1. Acute on chronic respiratory failure with hypoxia we will continue with full support on pressure control mode titrate oxygen as tolerated and continue pulmonary toilet. 2. Severe COPD we will continue with medical management 3. Chronic diastolic heart failure at baseline 4. Acute delirium no change we will continue to follow along   I have personally evaluated the patient, evaluated the laboratory and imaging results and formulated the assessment and plan and placed orders as needed. The Patient requires high complexity decision making with multiple system involvement. Rounds  were done with the Respiratory Therapy Director and respiratory therapist involved in the care of the patient as well as nursing staff.   Allyne Gee, MD [facility] Pulmonary Critical Care Medicine   This note is for inpatient care

## 2021-04-24 ENCOUNTER — Other Ambulatory Visit (HOSPITAL_COMMUNITY): Payer: Medicare Other | Admitting: Internal Medicine

## 2021-04-24 DIAGNOSIS — J449 Chronic obstructive pulmonary disease, unspecified: Secondary | ICD-10-CM

## 2021-04-24 DIAGNOSIS — J9621 Acute and chronic respiratory failure with hypoxia: Secondary | ICD-10-CM

## 2021-04-24 DIAGNOSIS — R410 Disorientation, unspecified: Secondary | ICD-10-CM

## 2021-04-24 DIAGNOSIS — I5032 Chronic diastolic (congestive) heart failure: Secondary | ICD-10-CM | POA: Diagnosis not present

## 2021-04-24 NOTE — Progress Notes (Signed)
[name]  PROGRESS NOTE  PULMONARY SERVICE ROUNDS   Sara Pierce  DOB: [birth year]  Referring physician: Deanne Coffer, MD  HPI: Sara Pierce is a 80 y.o. female  being seen for Acute on Chronic Respiratory Failure.  Patient resting comfortably right now without distress at this time remains on T collar has been on 40% FiO2 good saturations are noted.  Review of Systems: Unremarkable other than noted in HPI  Allergies:  Reviewed on the [facility]  Medications: Reviewed  Vitals: Temperature is 97.0 pulse 68 respiratory 27 blood pressure is 117/63 saturations 98%  Ventilator Settings: Off the ventilator right now on T collar with an FiO2 of 40%  Physical Exam: . General:  calm and comfortable NAD . Eyes: normal lids, irises & conjunctiva . ENT: grossly normal tongue not enlarged . Neck: no masses . Cardiovascular: S1 S2 Normal no rubs no gallop . Respiratory: No rhonchi very coarse breath sound . Abdomen: soft non-distended . Skin: no rash seen on limited exam . Musculoskeletal:  no rigidity . Psychiatric: unable to assess . Neurologic: no involuntary movements          Lab Data and radiological Data:  Labs have been reviewed   Assessment/Plan  Patient Active Problem List   Diagnosis Date Noted  . Acute on chronic respiratory failure with hypoxia ([place])   . COPD, severe ([place])   . Chronic diastolic heart failure ([place])   . Acute delirium       1. Acute on chronic respiratory failure hypoxia patient continues on T collar trials with PMV at this time patient currently is on 40% FiO2. 2. Severe COPD medical management nebulizers as deemed necessary. 3. Chronic diastolic heart failure monitor the patient's fluid status appears to be compensated right now we will continue to follow along. 4. Acute delirium no change we will continue with supportive care.   I have personally evaluated the patient, evaluated the laboratory and imaging results and  formulated the assessment and plan and placed orders as needed. The Patient requires high complexity decision making with multiple system involvement. Rounds were done with the Respiratory Therapy Director and respiratory therapist involved in the care of the patient as well as nursing staff.   Allyne Gee, MD [facility] Pulmonary Critical Care Medicine   This note is for inpatient care

## 2021-04-27 ENCOUNTER — Other Ambulatory Visit (HOSPITAL_COMMUNITY): Payer: Medicare Other | Admitting: Internal Medicine

## 2021-04-27 DIAGNOSIS — I5032 Chronic diastolic (congestive) heart failure: Secondary | ICD-10-CM | POA: Diagnosis not present

## 2021-04-27 DIAGNOSIS — J449 Chronic obstructive pulmonary disease, unspecified: Secondary | ICD-10-CM

## 2021-04-27 DIAGNOSIS — J9621 Acute and chronic respiratory failure with hypoxia: Secondary | ICD-10-CM

## 2021-04-27 DIAGNOSIS — R410 Disorientation, unspecified: Secondary | ICD-10-CM

## 2021-04-27 NOTE — Progress Notes (Signed)
[place]  PROGRESS NOTE  PULMONARY SERVICE ROUNDS   Sara Pierce  DOB: 10/24/1941  Referring physician: Deanne Coffer, MD  HPI: Sara Pierce is a 80 y.o. female  being seen for Acute on Chronic Respiratory Failure.  On full support on the ventilator low-grade fever was noted.  She will be weaning again hopefully today  Review of Systems: Unremarkable other than noted in HPI  Allergies:  Reviewed on the [facility]  Medications: Reviewed  Vitals: Temperature is 99.0 pulse 71 respiratory rate is 23 blood pressure is 125/76 saturations are 98%  Ventilator Settings: On pressure assist control FiO2 is 35% tidal volume is 305 PEEP 5 IP 18  Physical Exam: . General:  calm and comfortable NAD . Eyes: normal lids, irises & conjunctiva . ENT: grossly normal tongue not enlarged . Neck: no masses . Cardiovascular: S1 S2 Normal no rubs no gallop . Respiratory: No rhonchi very coarse breath sounds . Abdomen: soft non-distended . Skin: no rash seen on limited exam . Musculoskeletal:  no rigidity . Psychiatric: unable to assess . Neurologic: no involuntary movements          Lab Data and radiological Data:  No labs to report   Assessment/Plan  Patient Active Problem List   Diagnosis Date Noted  . Acute on chronic respiratory failure with hypoxia ([place])   . COPD, severe ([place])   . Chronic diastolic heart failure ([place])   . Acute delirium       1. Acute on chronic respiratory failure with hypoxia on pressure assist control mode on 35% FiO2 plan is going to be to continue with the vent settings and try to wean and will titrate oxygen as tolerated. 2. Severe COPD medical management 3. Chronic diastolic heart failure no change we will continue to follow 4. Acute delirium patient is at baseline right now   I have personally evaluated the patient, evaluated the laboratory and imaging results and formulated the assessment and plan and placed orders as needed. The  Patient requires high complexity decision making with multiple system involvement. Rounds were done with the Respiratory Therapy Director and respiratory therapist involved in the care of the patient as well as nursing staff.   Allyne Gee, MD [facility] Pulmonary Critical Care Medicine   This note is for inpatient care

## 2021-04-28 ENCOUNTER — Other Ambulatory Visit (HOSPITAL_COMMUNITY): Payer: Medicare Other | Admitting: Internal Medicine

## 2021-04-28 DIAGNOSIS — J449 Chronic obstructive pulmonary disease, unspecified: Secondary | ICD-10-CM | POA: Diagnosis not present

## 2021-04-28 DIAGNOSIS — I5032 Chronic diastolic (congestive) heart failure: Secondary | ICD-10-CM

## 2021-04-28 DIAGNOSIS — J9621 Acute and chronic respiratory failure with hypoxia: Secondary | ICD-10-CM | POA: Diagnosis not present

## 2021-04-28 DIAGNOSIS — R410 Disorientation, unspecified: Secondary | ICD-10-CM

## 2021-04-28 NOTE — Progress Notes (Signed)
[name]  PROGRESS NOTE  PULMONARY SERVICE ROUNDS   ROONEY GLADWIN  DOB: July 29, 1941  Referring physician: Deanne Coffer, MD  HPI: Sara Pierce is a 80 y.o. female  being seen for Acute on Chronic Respiratory Failure.  Patient is resting comfortably right now without distress on full support and pressure control mode  Review of Systems: Unremarkable other than noted in HPI  Allergies:  Reviewed on the [facility]  Medications: Reviewed  Vitals: Temperature 97.9 pulse 56 respiratory is 18 blood pressure 117/67 saturations are 97%  Ventilator Settings: On pressure assist control FiO2 35% IP 20 PEEP 5  Physical Exam: . General:  calm and comfortable NAD . Eyes: normal lids, irises & conjunctiva . ENT: grossly normal tongue not enlarged . Neck: no masses . Cardiovascular: S1 S2 Normal no rubs no gallop . Respiratory: No rhonchi very coarse breath sounds . Abdomen: soft non-distended . Skin: no rash seen on limited exam . Musculoskeletal:  no rigidity . Psychiatric: unable to assess . Neurologic: no involuntary movements          Lab Data and radiological Data:  Labs have been reviewed   Assessment/Plan  Patient Active Problem List   Diagnosis Date Noted  . Acute on chronic respiratory failure with hypoxia ([place])   . COPD, severe ([place])   . Chronic diastolic heart failure ([place])   . Acute delirium       1. Acute on chronic respiratory failure with hypoxia patient is doing fine right now on the ventilator.  Planning on discharging this week we will monitor the patient's labs closely 2. Severe COPD medical management 3. Chronic diastolic heart failure patient is at baseline 4. Acute delirium no change   I have personally evaluated the patient, evaluated the laboratory and imaging results and formulated the assessment and plan and placed orders as needed. The Patient requires high complexity decision making with multiple system involvement. Rounds  were done with the Respiratory Therapy Director and respiratory therapist involved in the care of the patient as well as nursing staff.   Allyne Gee, MD [facility] Pulmonary Critical Care Medicine   This note is for inpatient care

## 2021-07-13 DEATH — deceased

## 2021-08-25 IMAGING — DX DG CHEST 2V
3 series · 3 of 3 positions shown · non-contrast
Comparison: Chest radiograph dated 09/16/2018 and 04/21/2018.

CLINICAL DATA: 78-year-old female with concern for aspiration.

EXAM:
CHEST - 2 VIEW

[chest lat (1 of 2)]
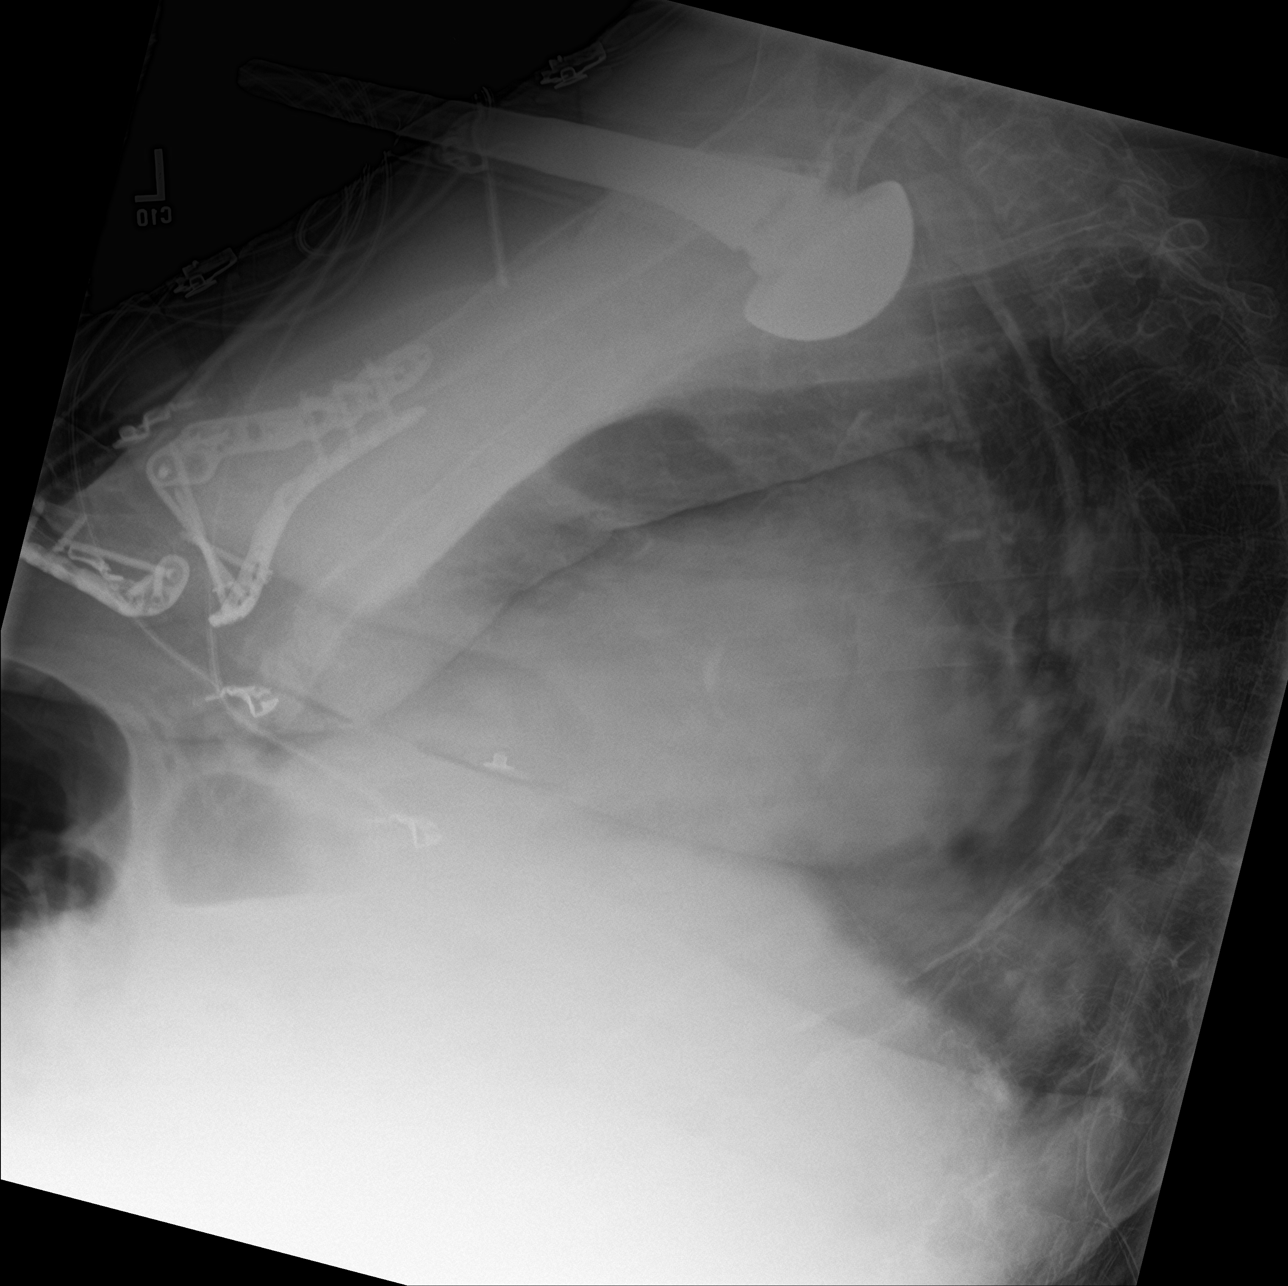

[chest ap]
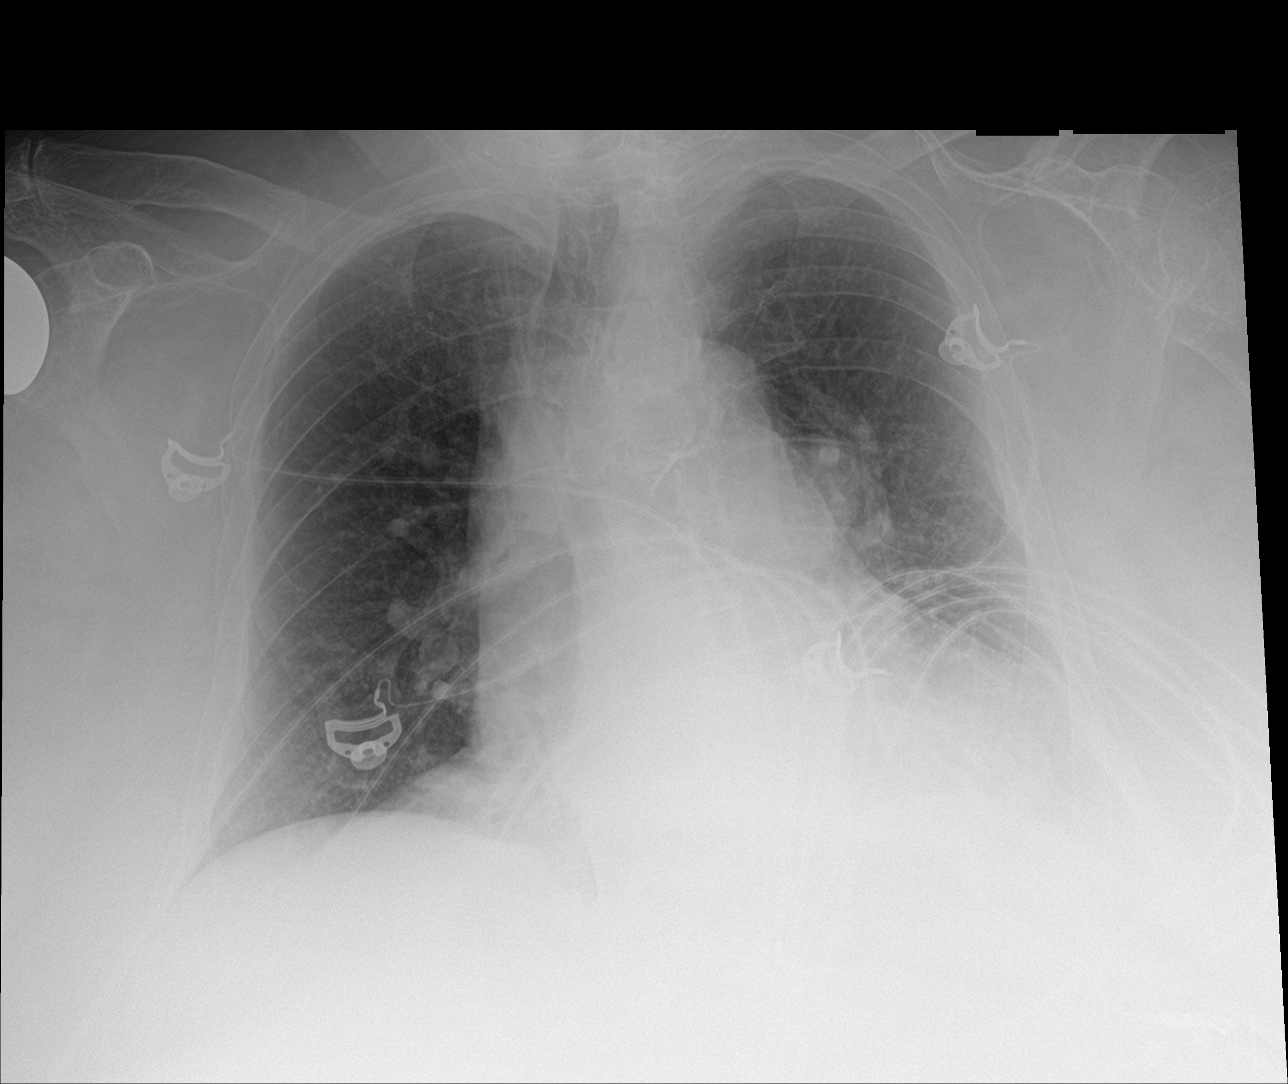

[chest lat (2 of 2)]
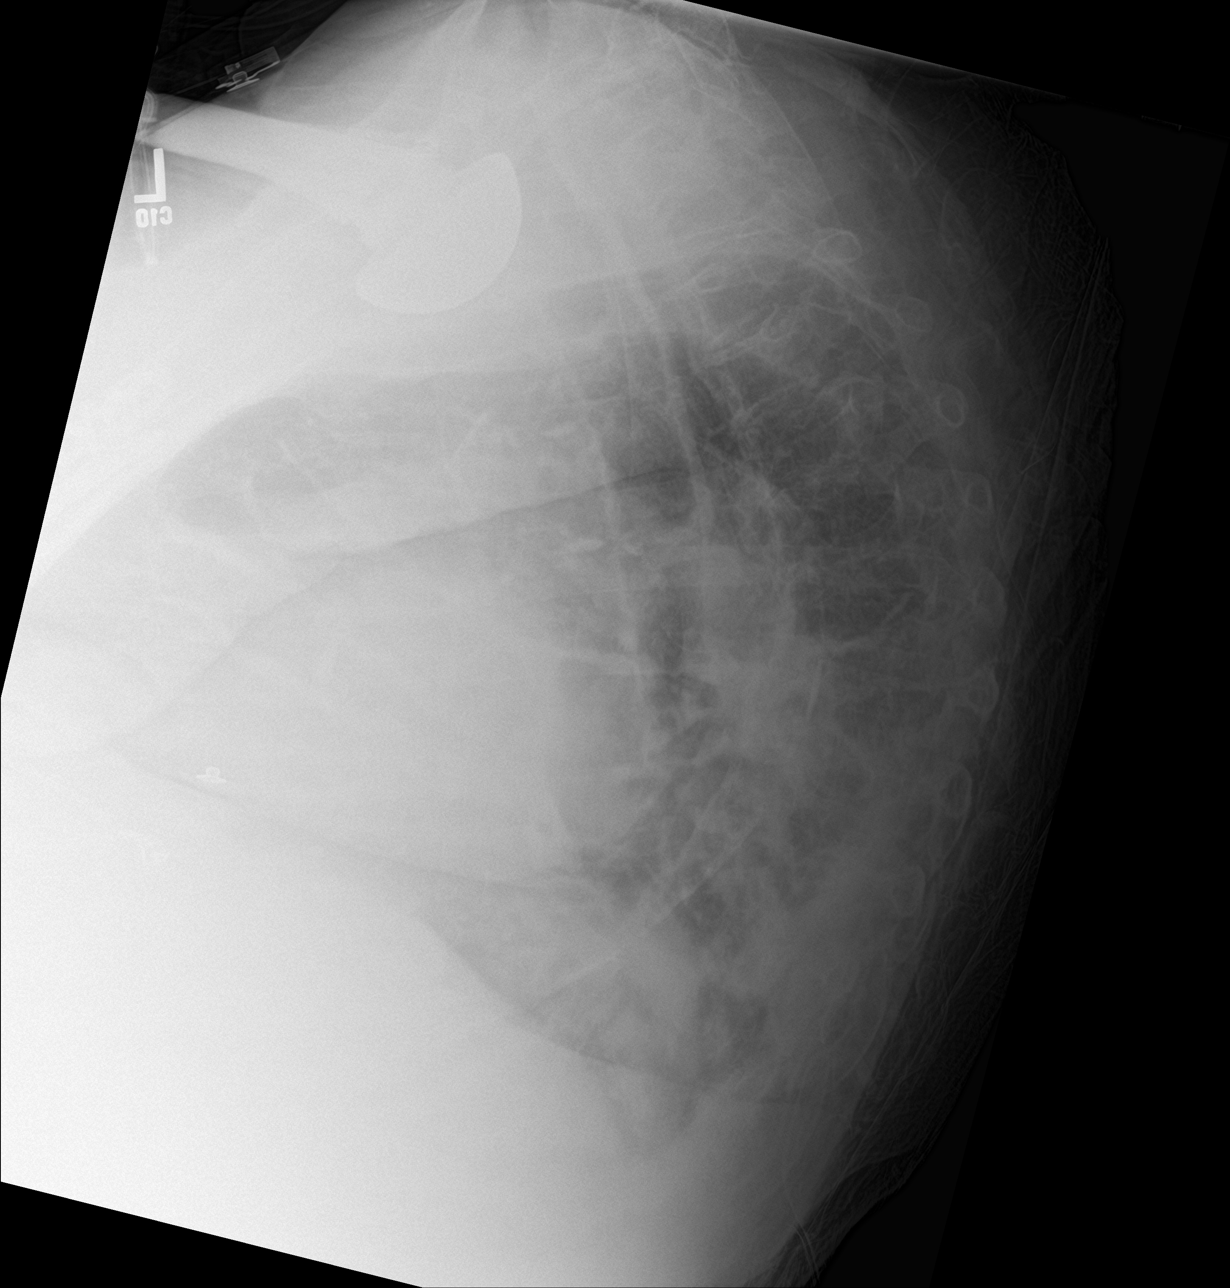

[3 of 3 positions shown; findings below may reference images not displayed]

FINDINGS: Stable cardiomegaly. There is prominence of the hilar vasculature,
likely representing a degree of pulmonary hypertension. Left lung
base density may represent atelectasis or infiltrate. Small layering
pleural effusion may be present on the lateral view. No
pneumothorax. Atherosclerotic calcification of the aorta. No acute
osseous pathology. Partially visualized right shoulder arthroplasty.
IMPRESSION: 1. Left lung base atelectasis or infiltrate. A small left pleural
effusion may be present.
2. Cardiomegaly with findings suggestive of pulmonary hypertension.

## 2021-08-25 IMAGING — CT CT ABD-PELV W/O CM
2 of 4 series · 17 of 46 positions shown, 19 images · non-contrast
Comparison: March 01, 2018

CLINICAL DATA: Nausea and vomiting.

EXAM:
CT ABDOMEN AND PELVIS WITHOUT CONTRAST
TECHNIQUE: Multidetector CT imaging of the abdomen and pelvis was performed
following the standard protocol without IV contrast.

[Series 3: ap without · axial · non-contrast · 0.98mm/px · z∈[-397,+33]mm · 14 of 96 slices shown, 16 images]
[im 5/96  soft-tissue]
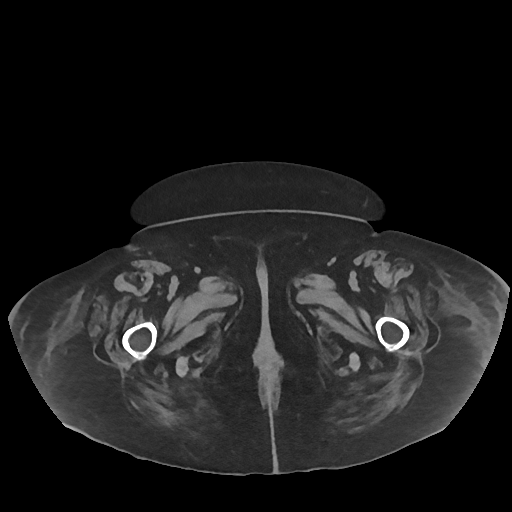
[im 5/96  bone]
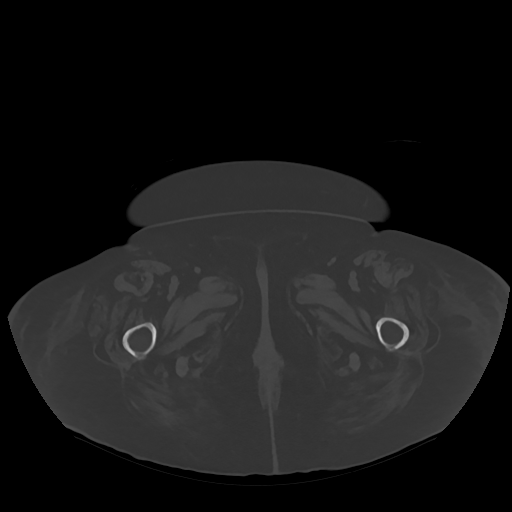
[im 15/96  soft-tissue]
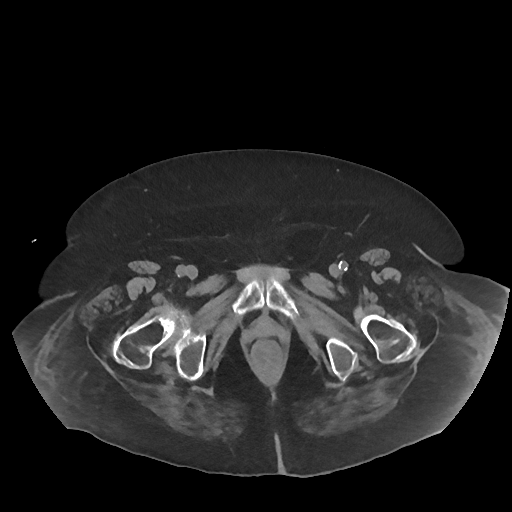
[im 20/96  soft-tissue]
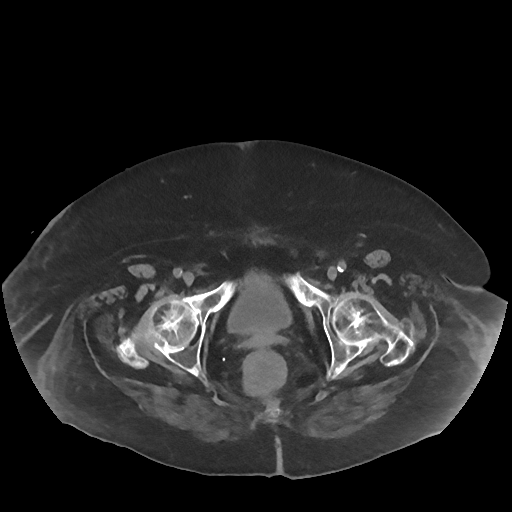
[im 24/96  soft-tissue]
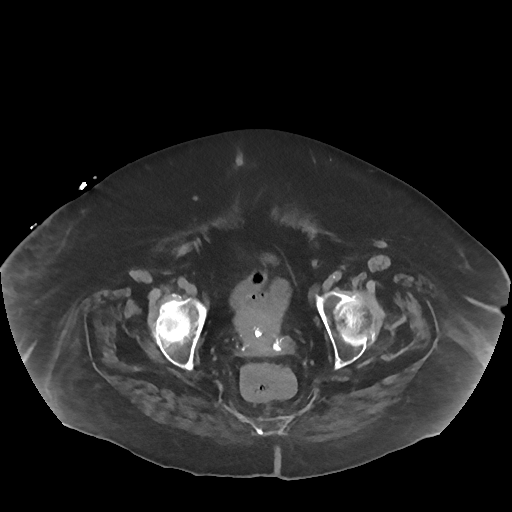
[im 34/96  soft-tissue]
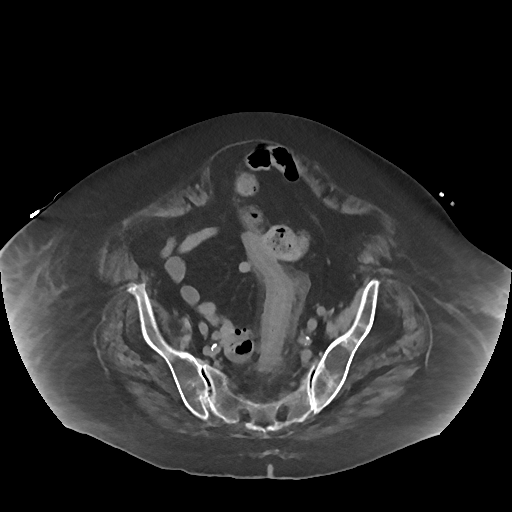
[im 39/96  soft-tissue]
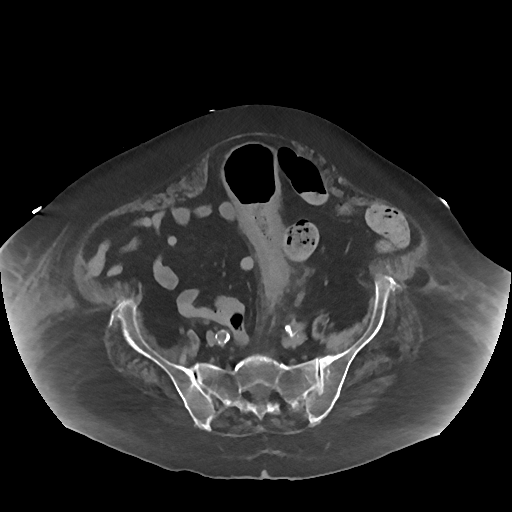
[im 43/96  soft-tissue]
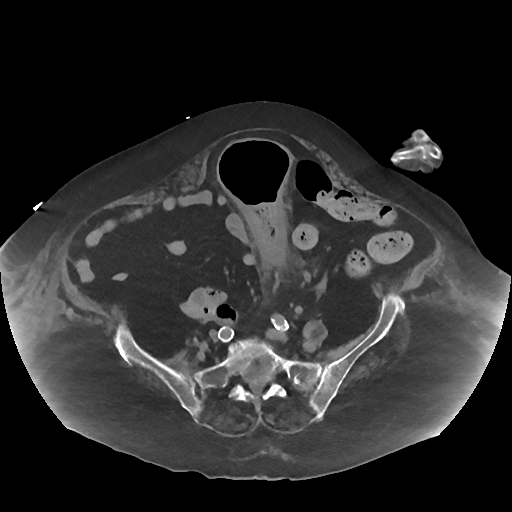
[im 53/96  soft-tissue]
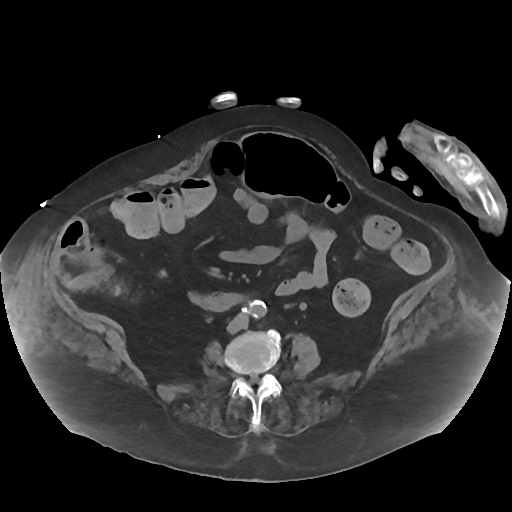
[im 58/96  soft-tissue]
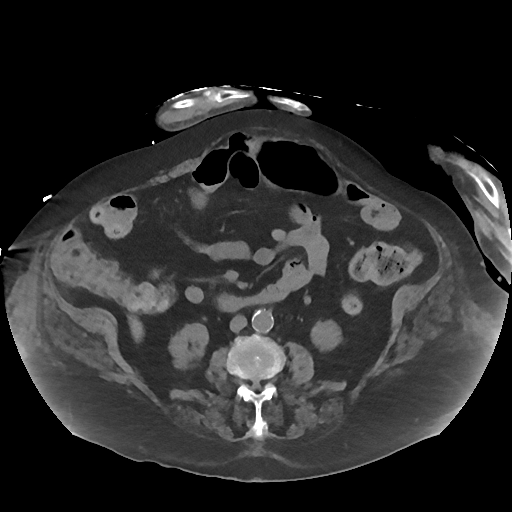
[im 58/96  bone]
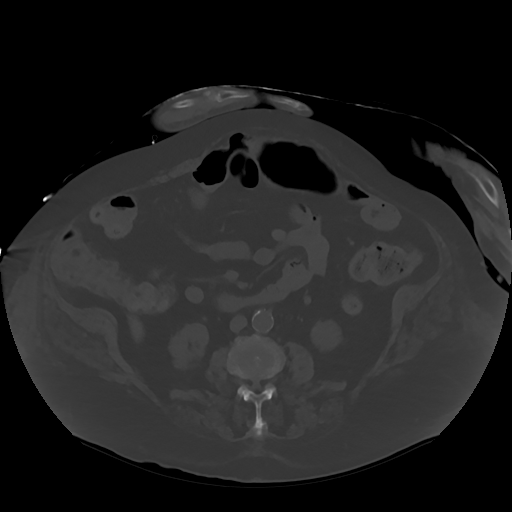
[im 62/96  soft-tissue]
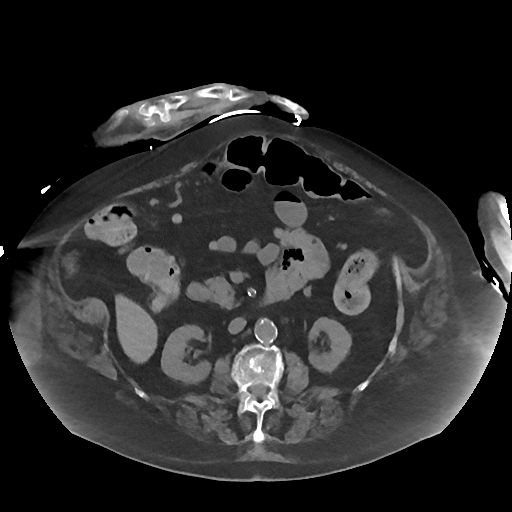
[im 72/96  soft-tissue]
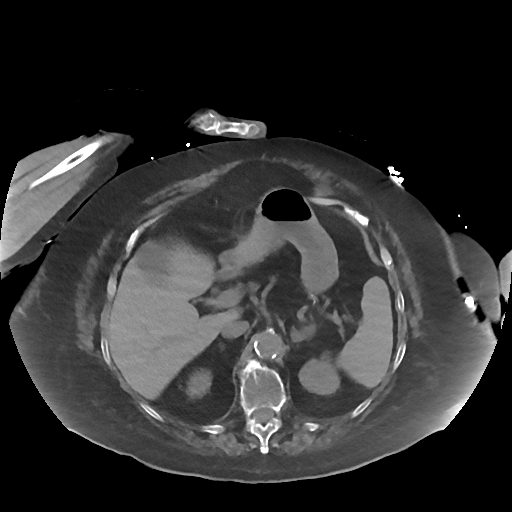
[im 77/96  soft-tissue]
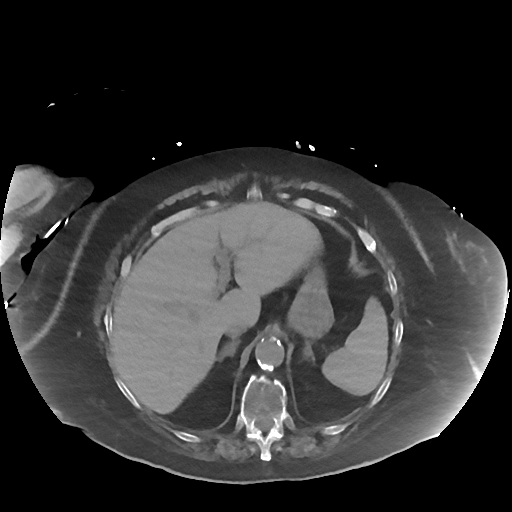
[im 81/96  soft-tissue]
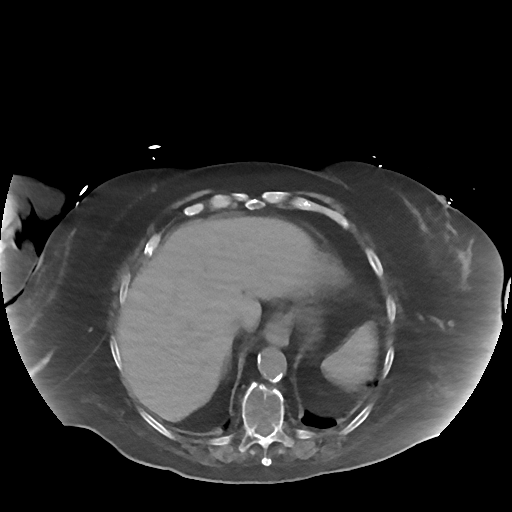
[im 91/96  soft-tissue]
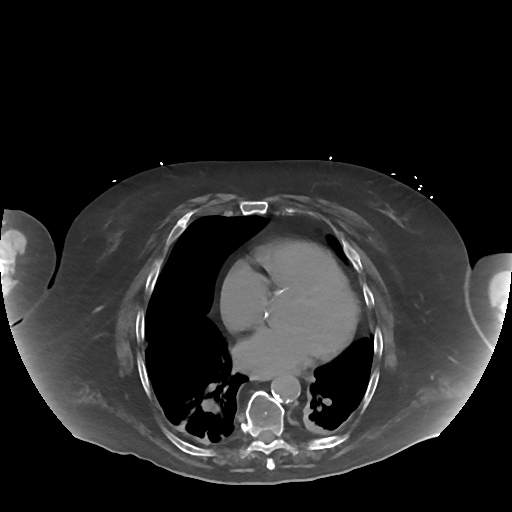

[Series 6: cor · coronal · 0.95mm/px · 3 of 135 slices shown]
[im 45/135  soft-tissue]
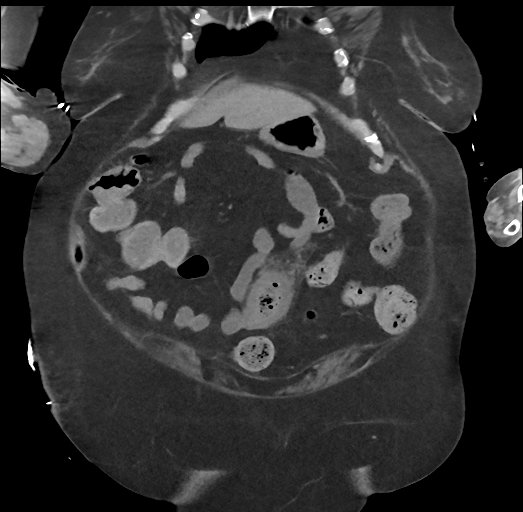
[im 60/135  soft-tissue]
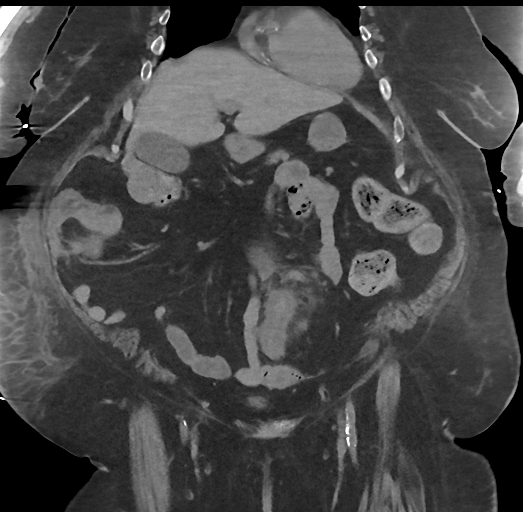
[im 75/135  soft-tissue]
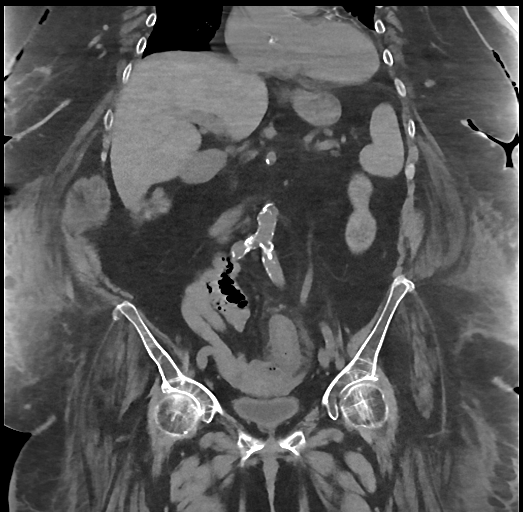

[17 of 46 positions shown; findings below may reference images not displayed]

FINDINGS: Lower chest: Moderate-severity atelectatic changes are seen within
the bilateral lung bases.

Hepatobiliary: No focal liver abnormality is seen. No gallstones,
gallbladder wall thickening, or biliary dilatation.

Pancreas: Unremarkable. No pancreatic ductal dilatation or
surrounding inflammatory changes.

Spleen: Normal in size without focal abnormality.

Adrenals/Urinary Tract: There is mild diffuse enlargement of the
right adrenal gland. 1.4 cm x 1.2 cm and 1.3 cm x 1.1 cm
low-attenuation left adrenal masses are noted. Kidneys are normal,
without renal calculi, focal lesion, or hydronephrosis. Bladder is
unremarkable.

Stomach/Bowel: There is a small hiatal hernia. The appendix is not
identified. No evidence of bowel dilatation. There is mild diffuse
thickening of the mid and distal sigmoid colon with a mild amount of
pericolonic inflammatory fat stranding.

Vascular/Lymphatic: Marked severity aortic atherosclerosis. No
enlarged abdominal or pelvic lymph nodes.

Reproductive: Ill-defined coarse appearing uterine calcifications
are seen.

Other: No abdominal wall hernia or abnormality. No abdominopelvic
ascites.

Musculoskeletal: Multilevel degenerative changes seen throughout the
lumbar spine.
IMPRESSION: 1. Mild to moderate severity colitis involving the mid and distal
sigmoid colon.
2. Small hiatal hernia.
3. Bilateral low-attenuation adrenal masses, likely representing
adrenal adenomas.
4. Marked severity aortic atherosclerosis.

Aortic Atherosclerosis ([id number].O).

## 2021-08-26 IMAGING — DX DG CHEST 1V PORT
1 series · 1 of 1 positions shown · non-contrast
Comparison: PA and lateral chest 01/16/2020. Single-view of the
chest 09/16/2018.

CLINICAL DATA: Cough in a patient with a history of COPD.

EXAM:
PORTABLE CHEST 1 VIEW

[chest ap]
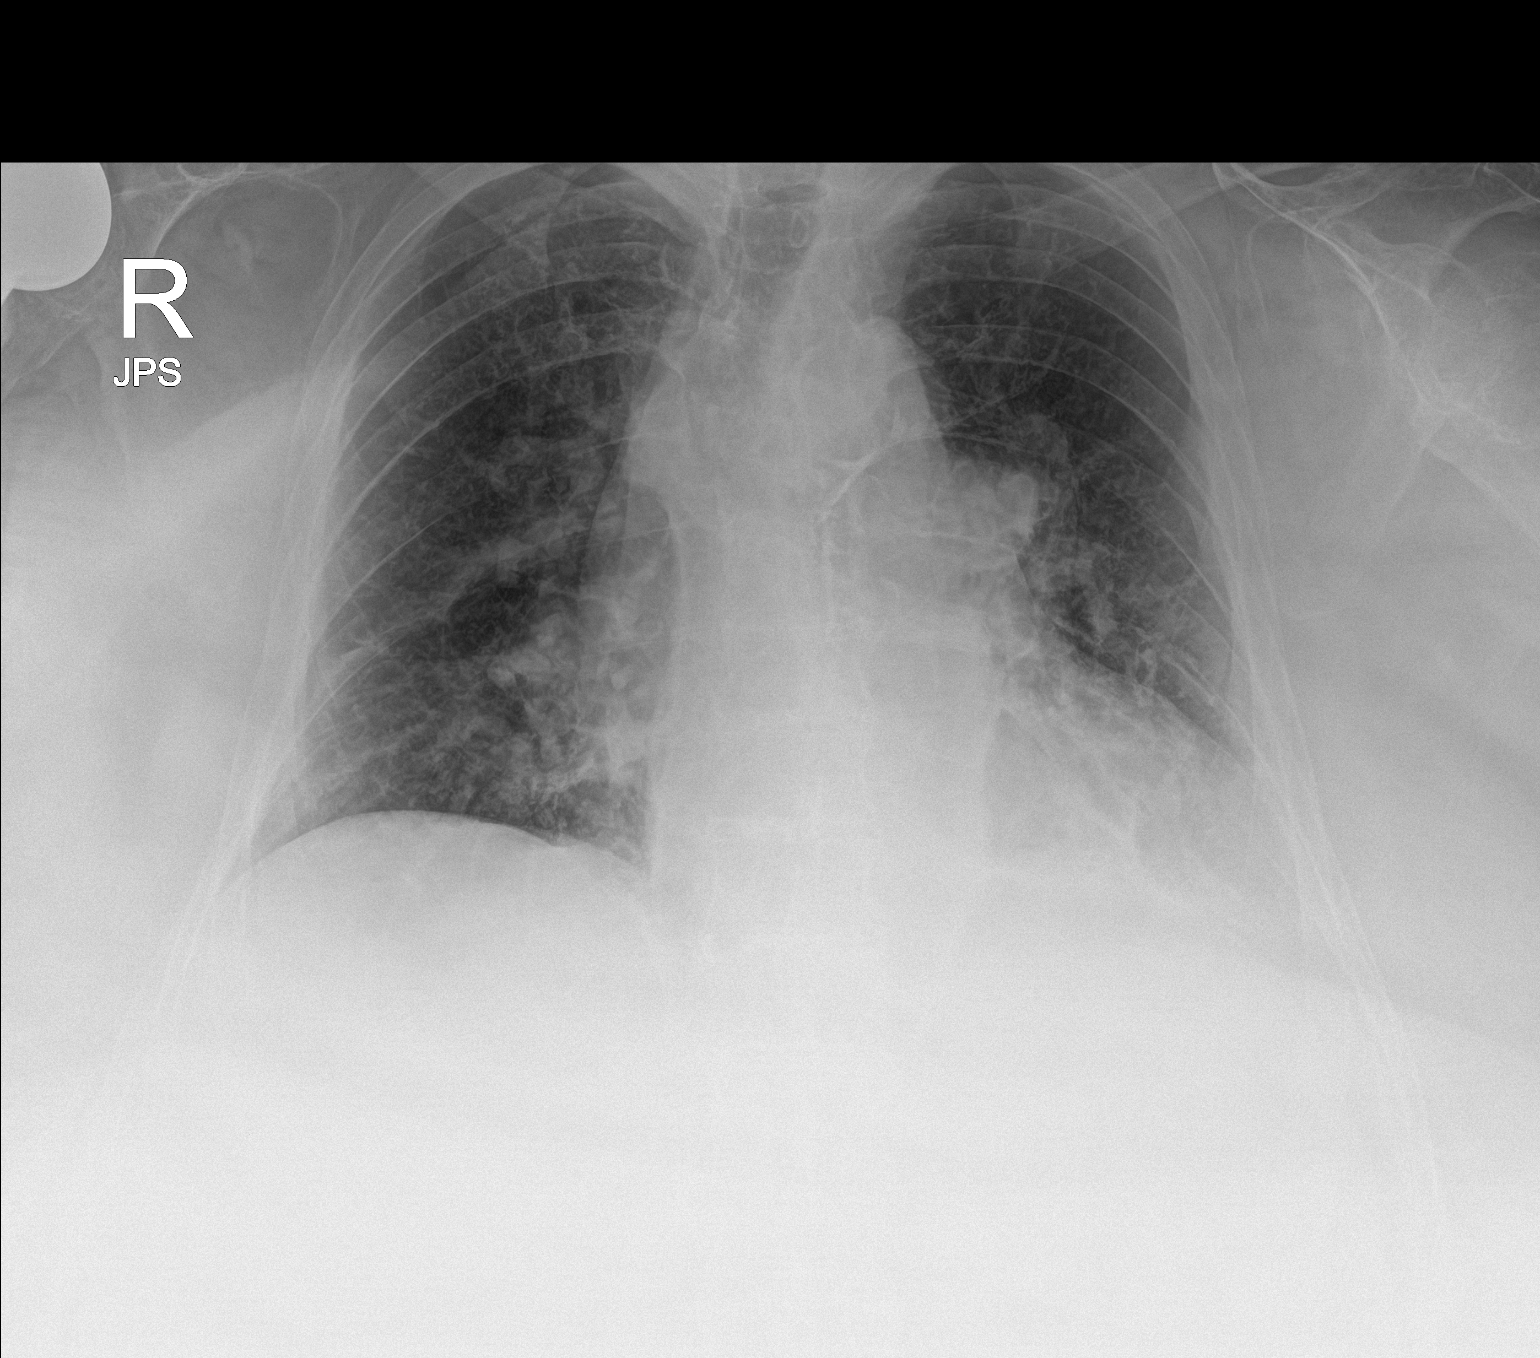

[1 of 1 positions shown; findings below may reference images not displayed]

FINDINGS: There is left basilar atelectasis. Right lung is clear. Heart size
is upper normal. Aortic atherosclerosis noted. No pneumothorax or
pleural fluid. No acute bony abnormality.
IMPRESSION: No acute disease.  Left basilar atelectasis noted.

Atherosclerosis.

## 2021-08-29 IMAGING — DX DG CHEST 1V PORT
1 series · 1 of 1 positions shown · non-contrast
Comparison: January 17, 2020

CLINICAL DATA: Wheezing

EXAM:
PORTABLE CHEST 1 VIEW

[chest ap]
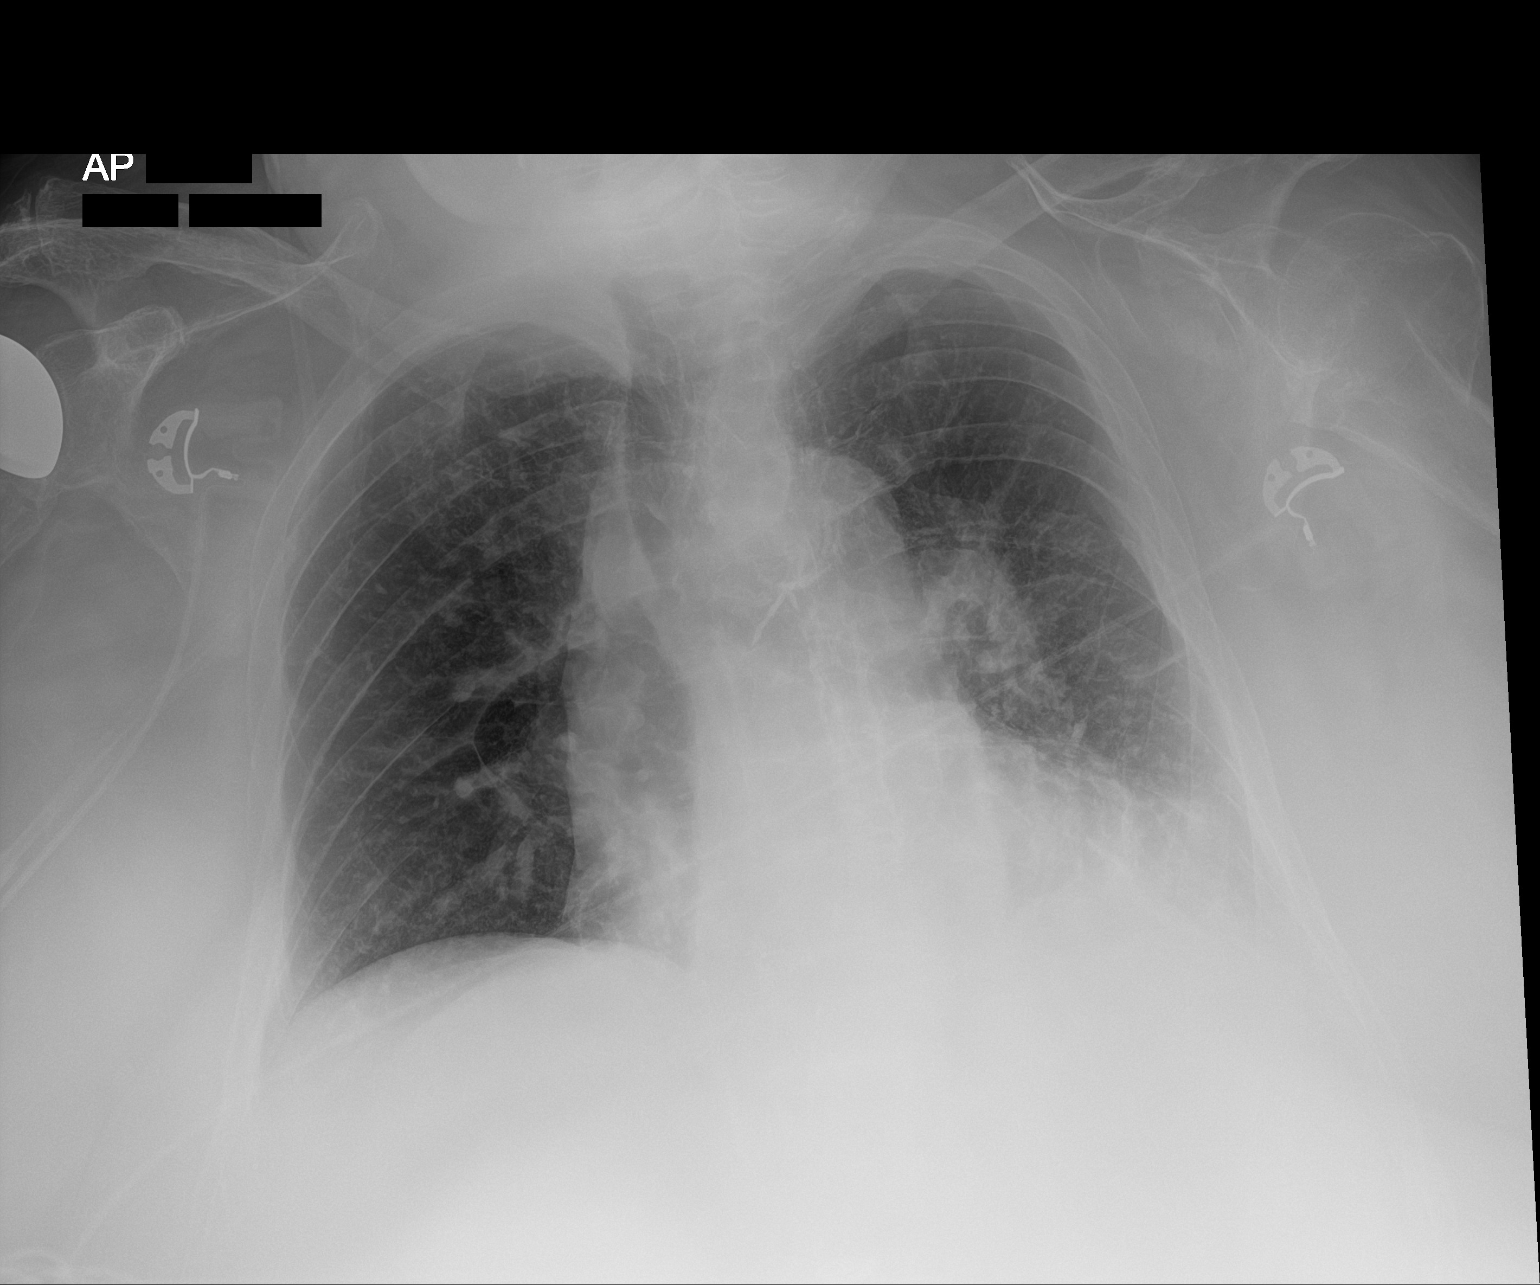

[1 of 1 positions shown; findings below may reference images not displayed]

FINDINGS: The heart size and mediastinal contours are unchanged with mild
cardiomegaly. Aortic knob calcifications. There is again noted left
basilar subsegmental atelectasis. Overall shallow degree of
aeration. The right lung remains clear. No acute osseous
abnormality.
IMPRESSION: No active disease.  Unchanged left basilar atelectasis.

## 2021-12-28 IMAGING — CT CT NECK W/ CM
4 series · 15 of 33 positions shown, 18 images · IV contrast (omnipaque)
Comparison: None.

CLINICAL DATA: Aspiration

EXAM:
CT NECK WITH CONTRAST
TECHNIQUE: Multidetector CT imaging of the neck was performed using the
standard protocol following the bolus administration of intravenous
contrast.
CONTRAST:  75mL OMNIPAQUE IOHEXOL 300 MG/ML  SOLN

[Series 2: axial neck · axial · 0.60mm/px · z∈[-404,-252]mm · 5 of 115 slices shown, 7 images]
[im 20/115  soft-tissue]
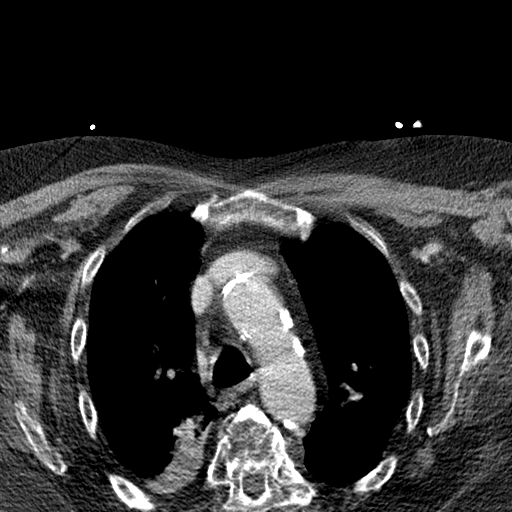
[im 20/115  bone]
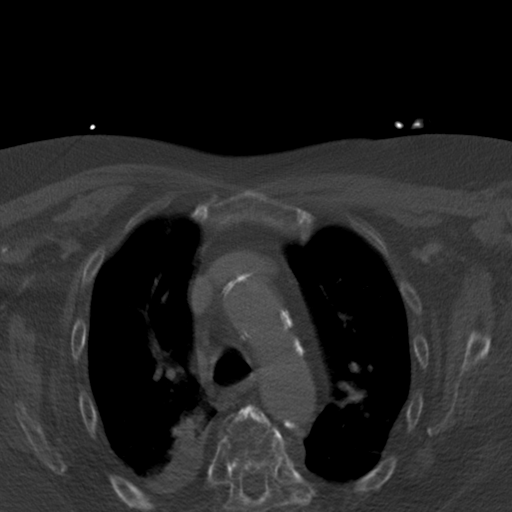
[im 39/115  bone]
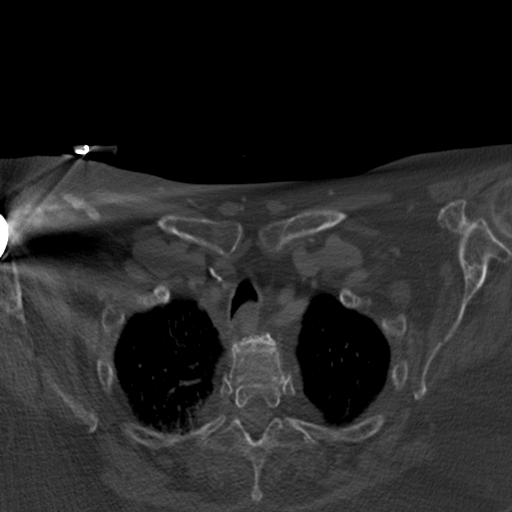
[im 58/115  bone]
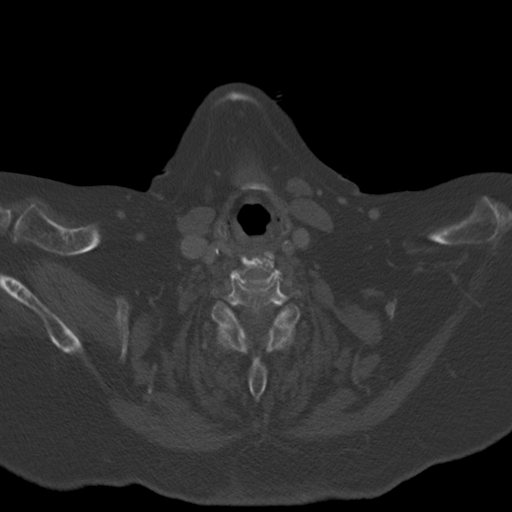
[im 77/115  bone]
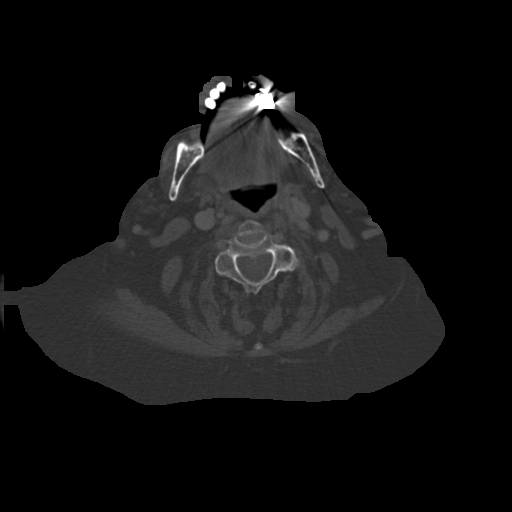
[im 96/115  soft-tissue]
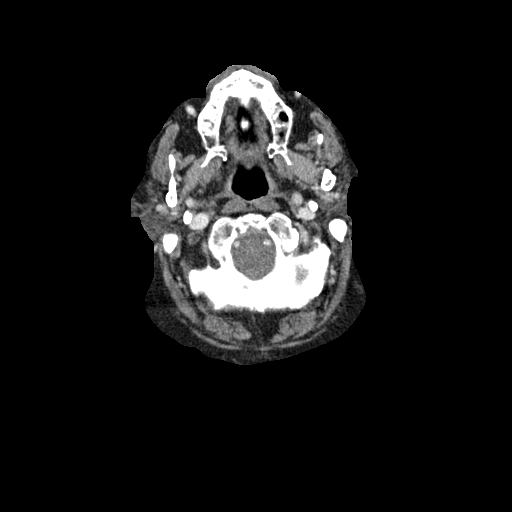
[im 96/115  bone]
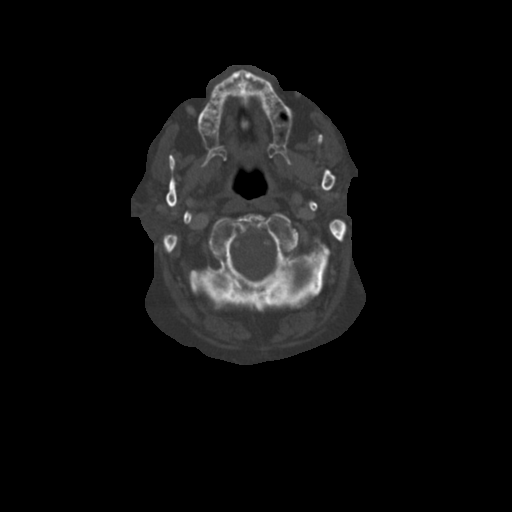

[Series 4: axial · axial · 0.51mm/px · z∈[-406,-368]mm · 2 of 114 slices shown]
[im 19/114  bone]
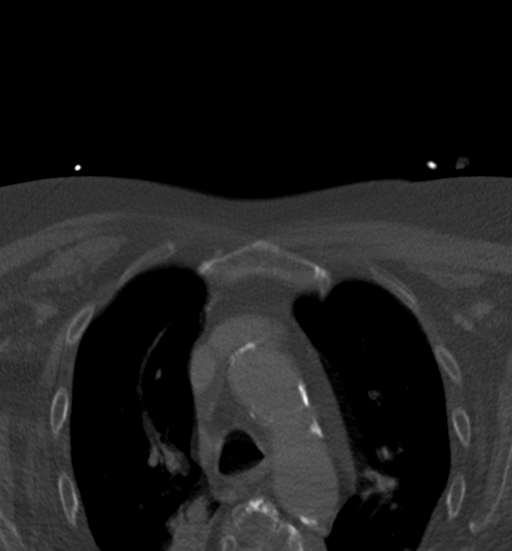
[im 38/114  bone]
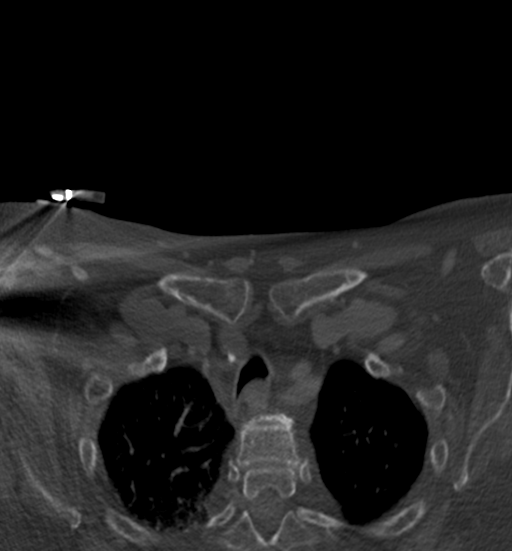

[Series 5: coronal · coronal · 0.48mm/px · 3 of 145 slices shown]
[im 29/145  bone]
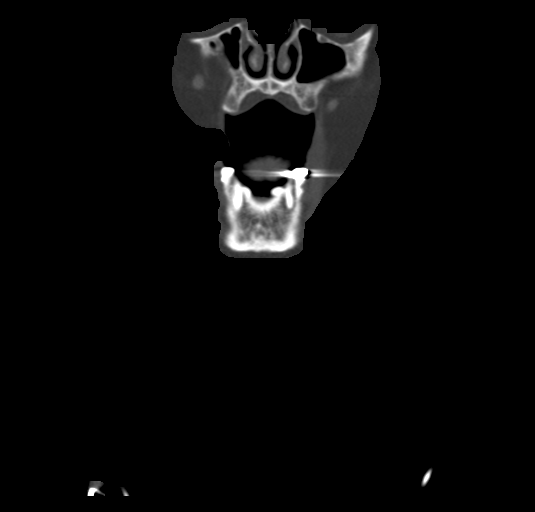
[im 58/145  bone]
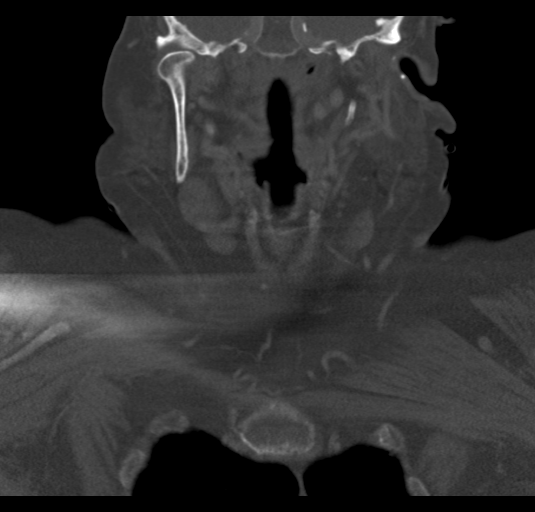
[im 87/145  bone]
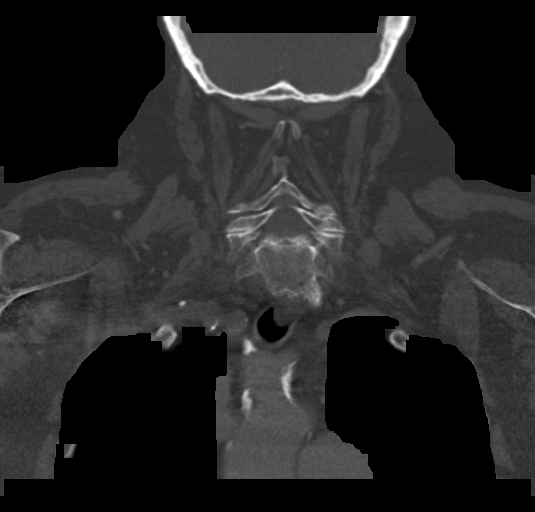

[Series 6: sagittal · sagittal · 0.48mm/px · 5 of 101 slices shown, 6 images]
[im 34/101  bone]
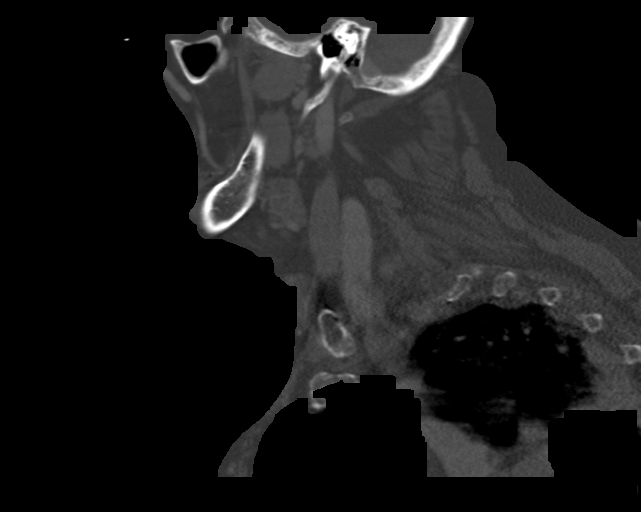
[im 42/101  bone]
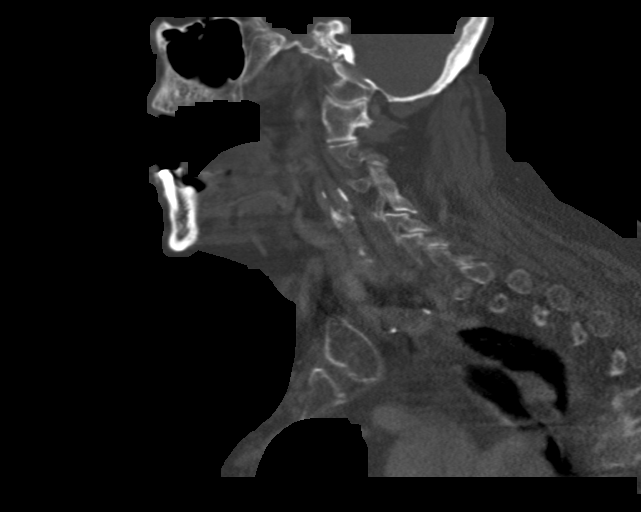
[im 51/101  soft-tissue]
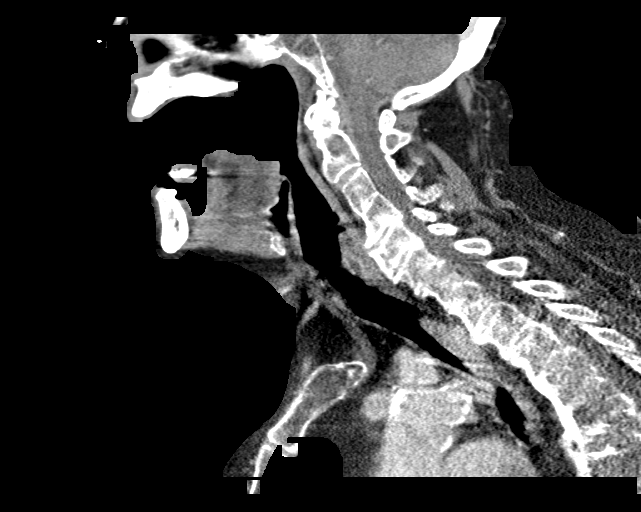
[im 51/101  bone]
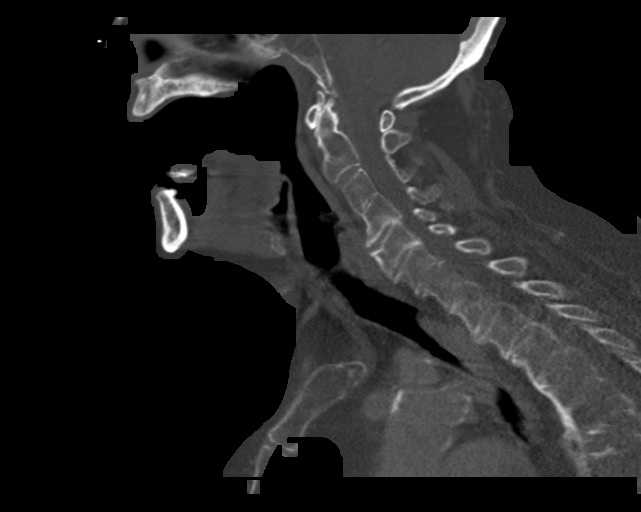
[im 59/101  bone]
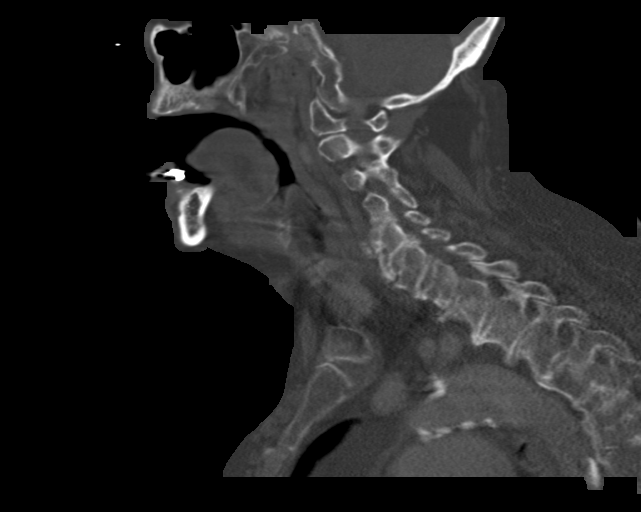
[im 67/101  bone]
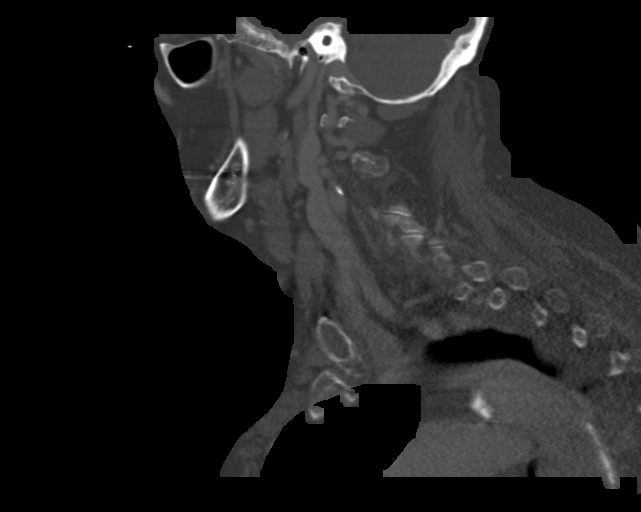

[15 of 33 positions shown; findings below may reference images not displayed]

FINDINGS: Pharynx and larynx: Normal. No mass or swelling.

Salivary glands: No inflammation, mass, or stone.

Thyroid: Normal.

Lymph nodes: None enlarged or abnormal density.

Vascular: Calcific aortic atherosclerosis.

Limited intracranial: Negative.

Visualized orbits: Negative.

Mastoids and visualized paranasal sinuses: Clear.

Skeleton: No acute or aggressive process.

Upper chest: Dependent right-sided atelectasis. The main pulmonary
artery is enlarged, measuring 3.8 cm in transverse dimension.

Other: None
IMPRESSION: 1. No aerodigestive tract foreign body.
2. Enlarged main pulmonary artery, consistent with pulmonary
hypertension.
3. Aortic Atherosclerosis ([id number].Y).

## 2021-12-28 IMAGING — CR DG CHEST 2V
2 series · 2 of 2 positions shown · non-contrast
Comparison: Chest radiograph 01/20/2020.  CT 04/17/2018

CLINICAL DATA: Choking episode.

EXAM:
CHEST - 2 VIEW

[w chest lat]
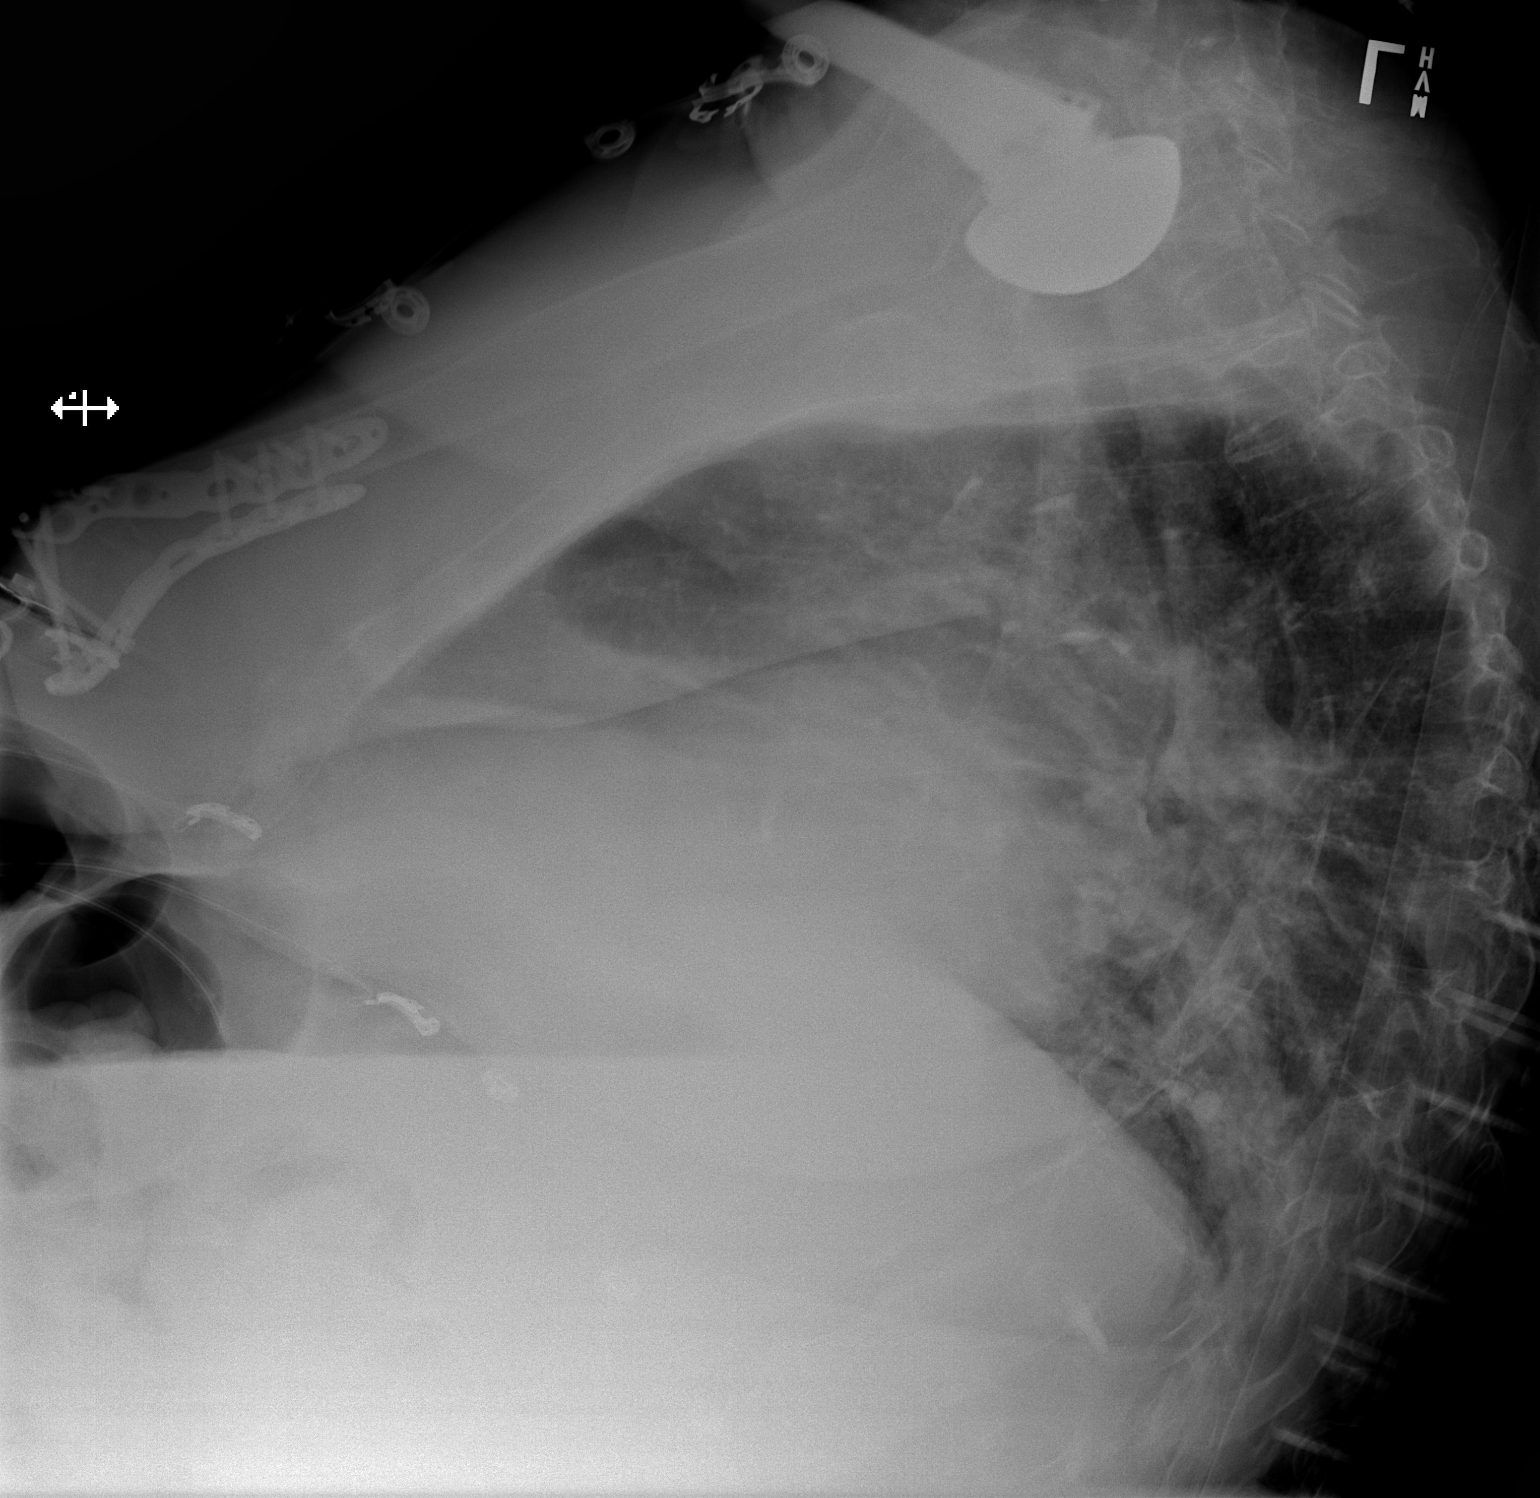

[x chest ap]
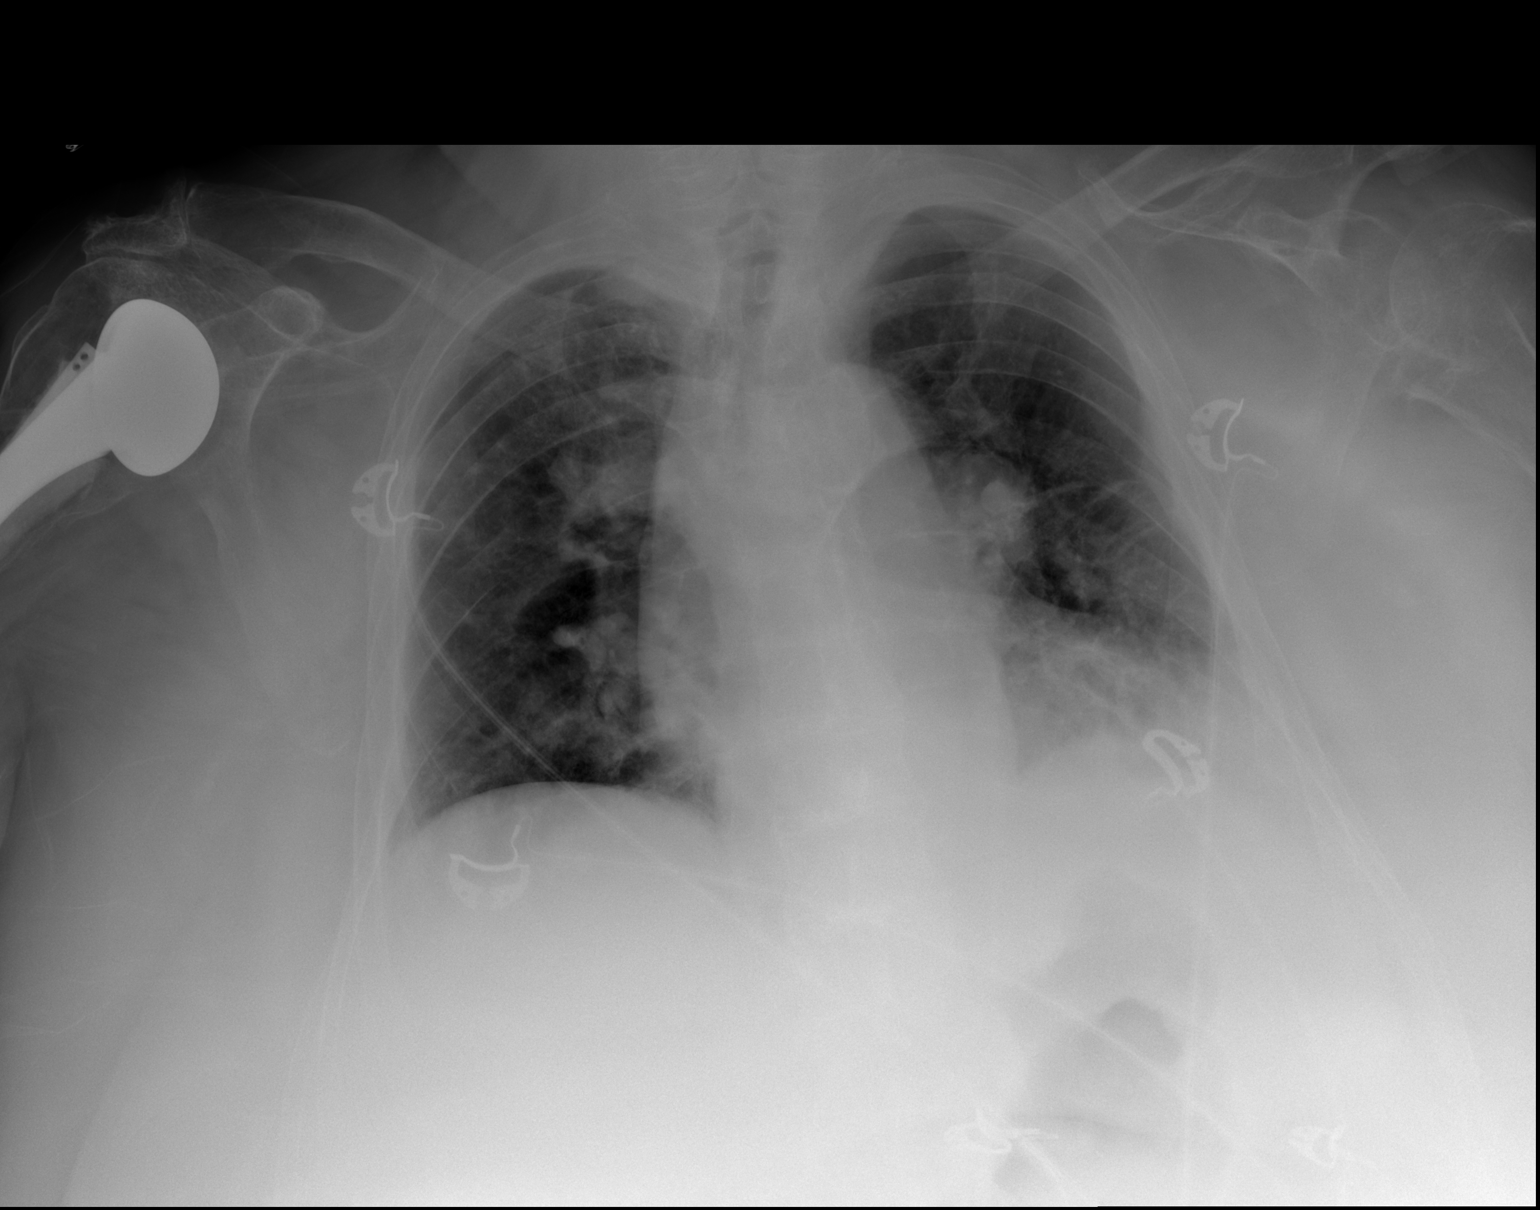

[2 of 2 positions shown; findings below may reference images not displayed]

FINDINGS: Low lung volumes. Unchanged heart size and mediastinal contours.
Small left pleural effusions suspected, may be chronic. Scattered
bilateral subsegmental atelectasis. No pneumothorax or evidence of
pneumomediastinum. Right proximal humeral arthroplasty. Chronic
deformity of the left proximal humerus. Bones are diffusely under
mineralized.
IMPRESSION: 1. Low lung volumes with scattered subsegmental atelectasis.
2. Small left pleural effusion suspected, may be chronic.

## 2022-03-15 IMAGING — CR DG CHEST 1V
1 series · 1 of 1 positions shown · non-contrast
Comparison: 05/20/2020

CLINICAL DATA: Cough

EXAM:
CHEST  1 VIEW

[dg chest 1 view]
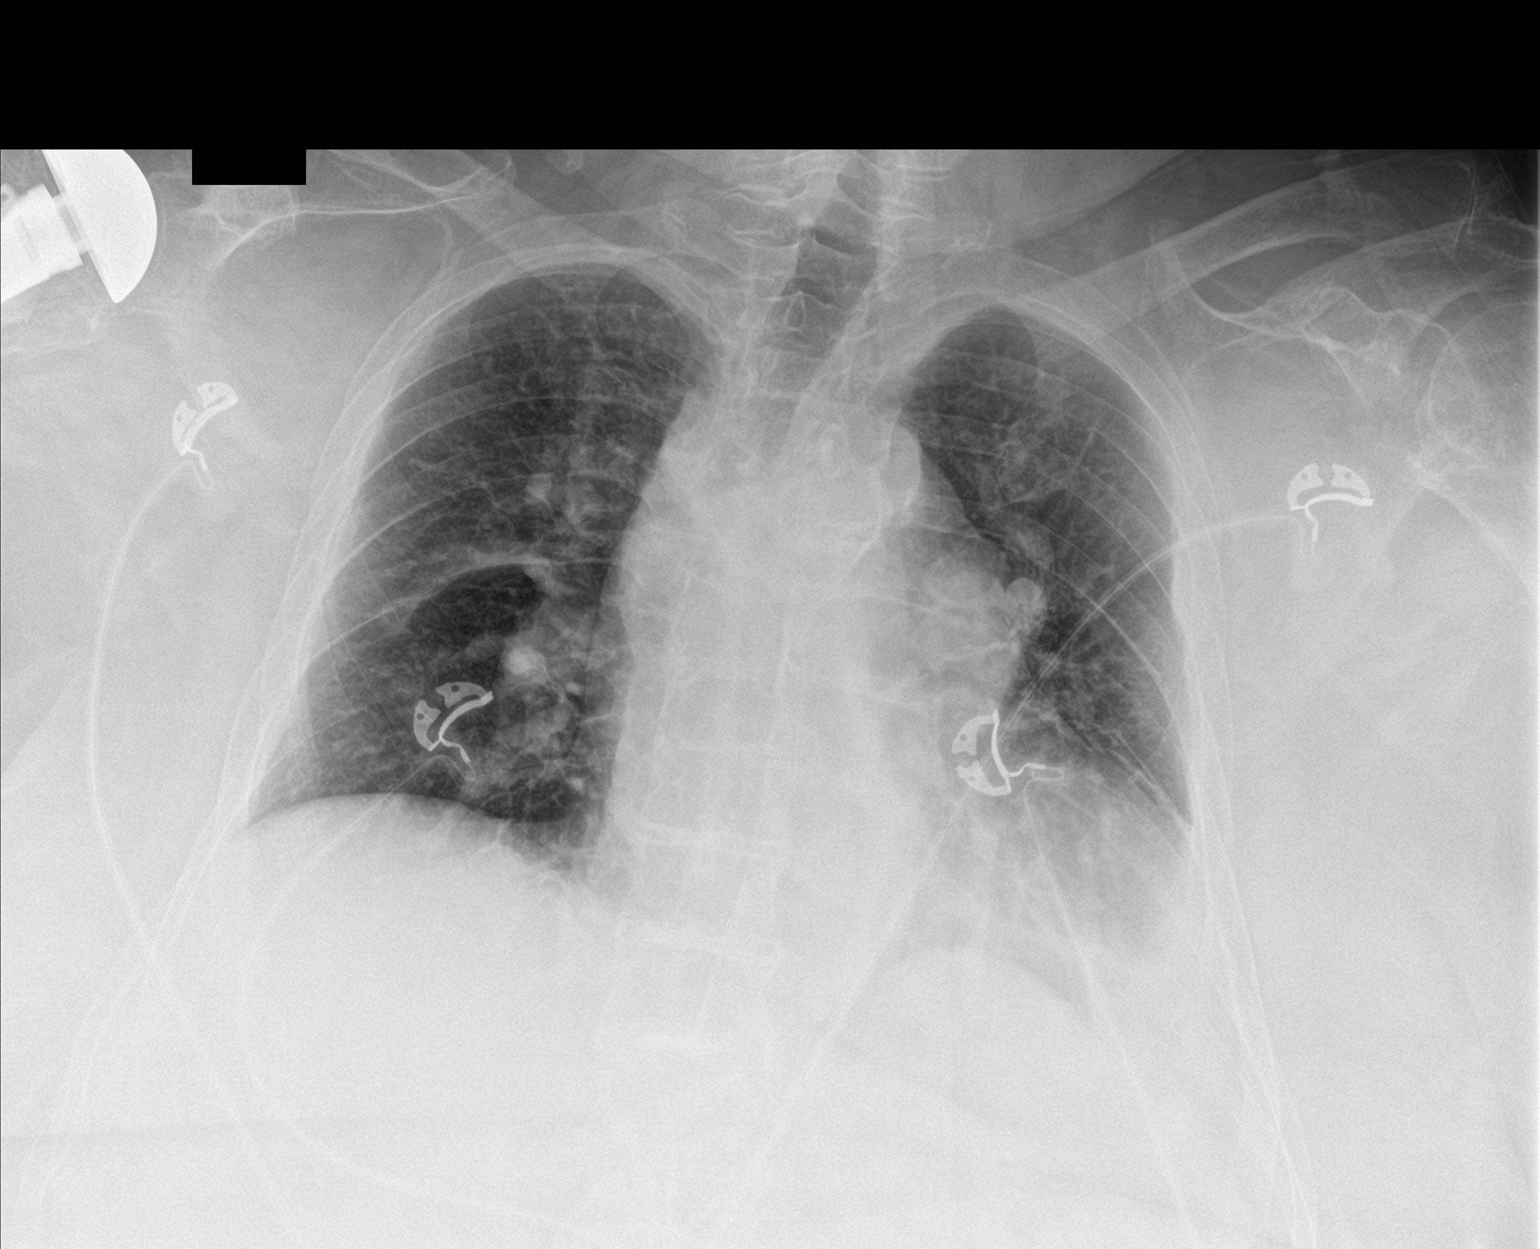

[1 of 1 positions shown; findings below may reference images not displayed]

FINDINGS: Lung volumes are small, however, pulmonary insufflation remain
stable since prior examination. Left basilar scarring is unchanged.
No superimposed confluent pulmonary infiltrate. No pneumothorax or
pleural effusion. Superior mediastinal widening is noted, likely
accentuated by supine positioning and poor pulmonary insufflation.
Mild cardiomegaly appears stable. Pulmonary vascularity is normal.
No acute bone abnormality. Right total shoulder arthroplasty has
been performed.
IMPRESSION: 1. No acute cardiopulmonary disease.
2. Superior mediastinal widening, likely accentuated by supine
positioning and poor pulmonary insufflation. This may be better
assessed with a standard two view chest radiograph or CT imaging.

## 2022-03-15 IMAGING — CR DG HIP (WITH OR WITHOUT PELVIS) 2-3V*L*
1 series · 4 of 4 positions shown · non-contrast
Comparison: None.

CLINICAL DATA: Left hip pain

EXAM:
DG HIP (WITH OR WITHOUT PELVIS) 2-3V LEFT

[Series 1: dg hip unilat w or w/o pelvis 2-3 views  · non-contrast · 0.14mm/px · 4 of 4 slices shown]
[im 1/4]
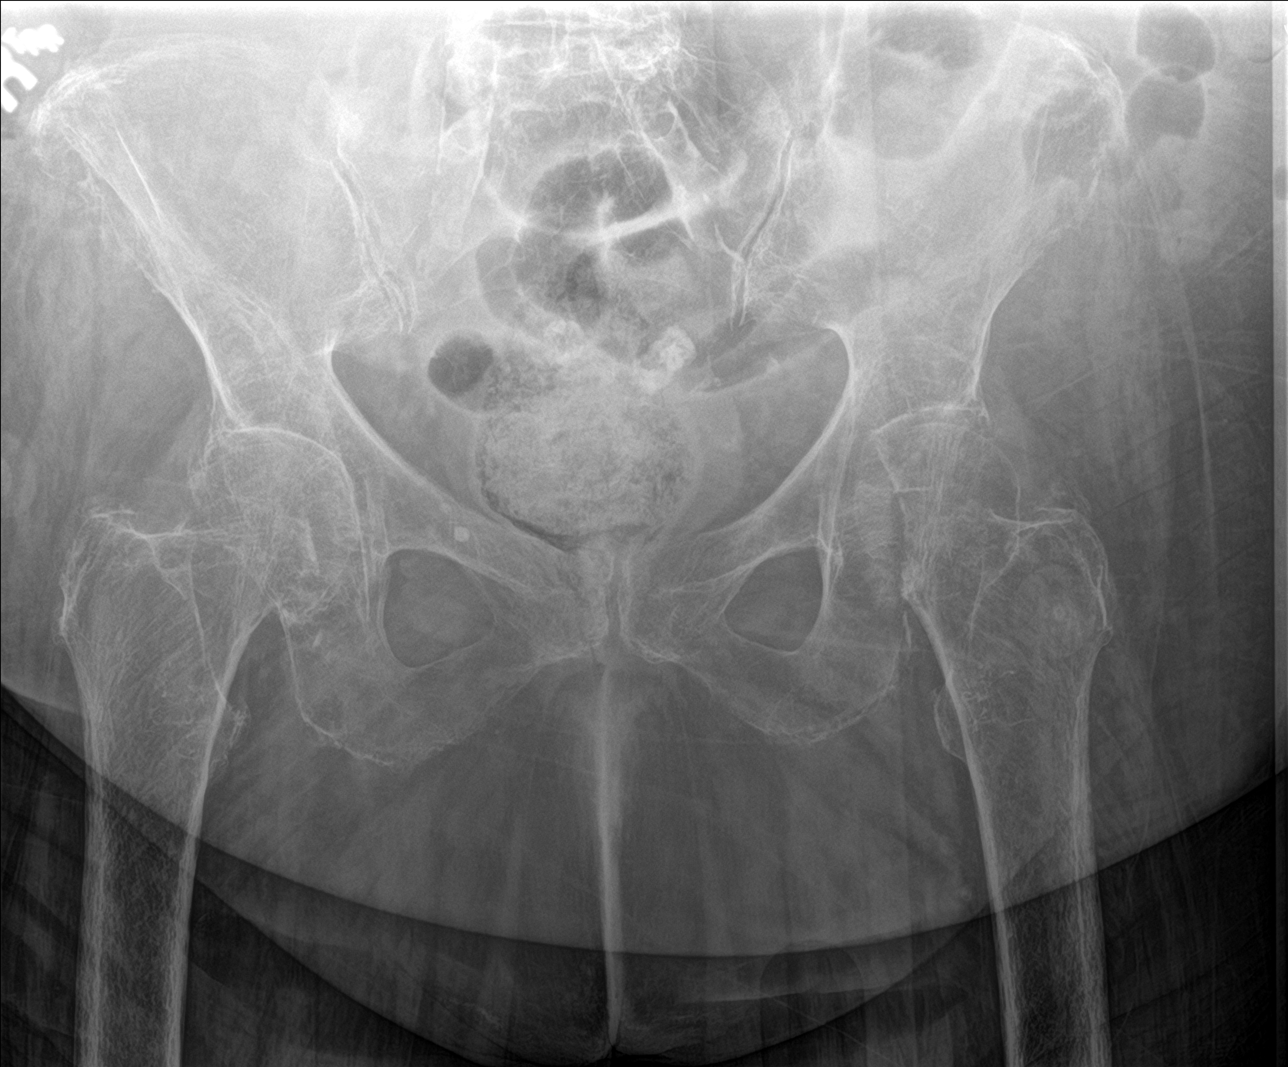
[im 2/4]
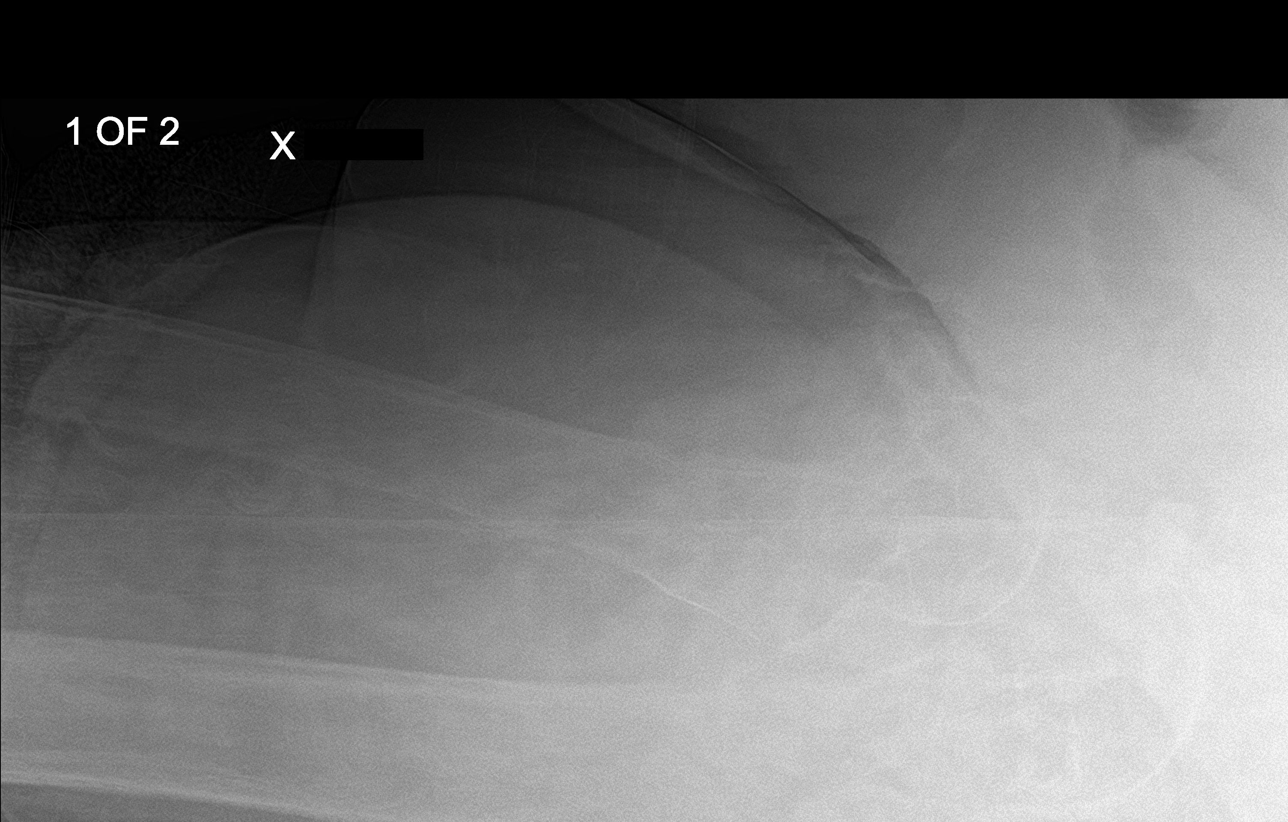
[im 3/4]
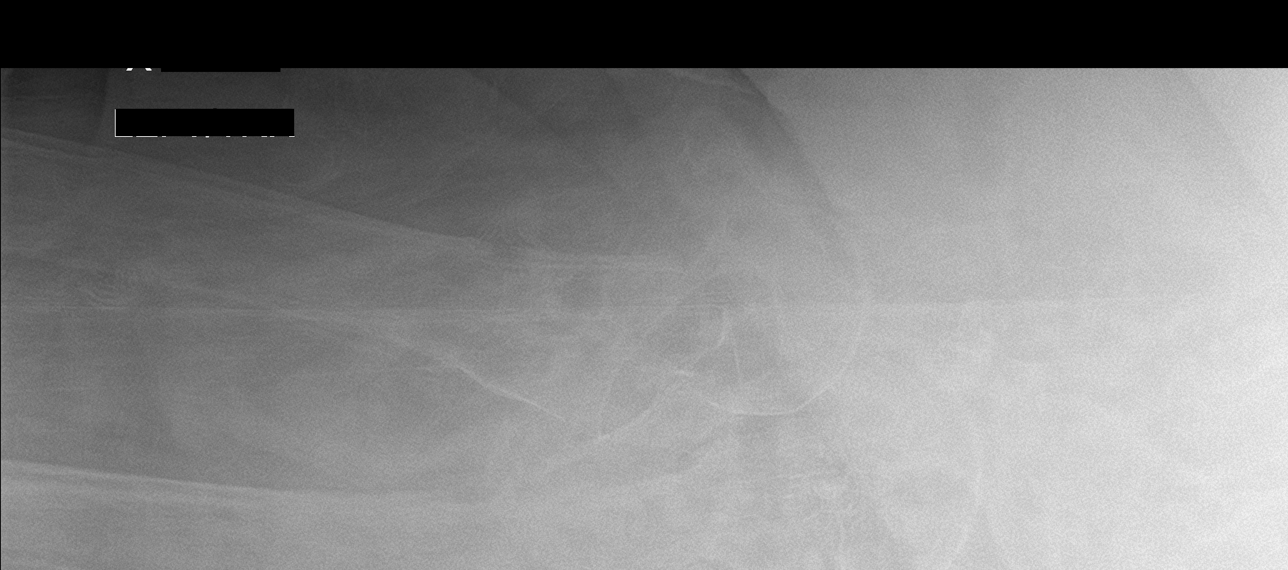
[im 4/4]
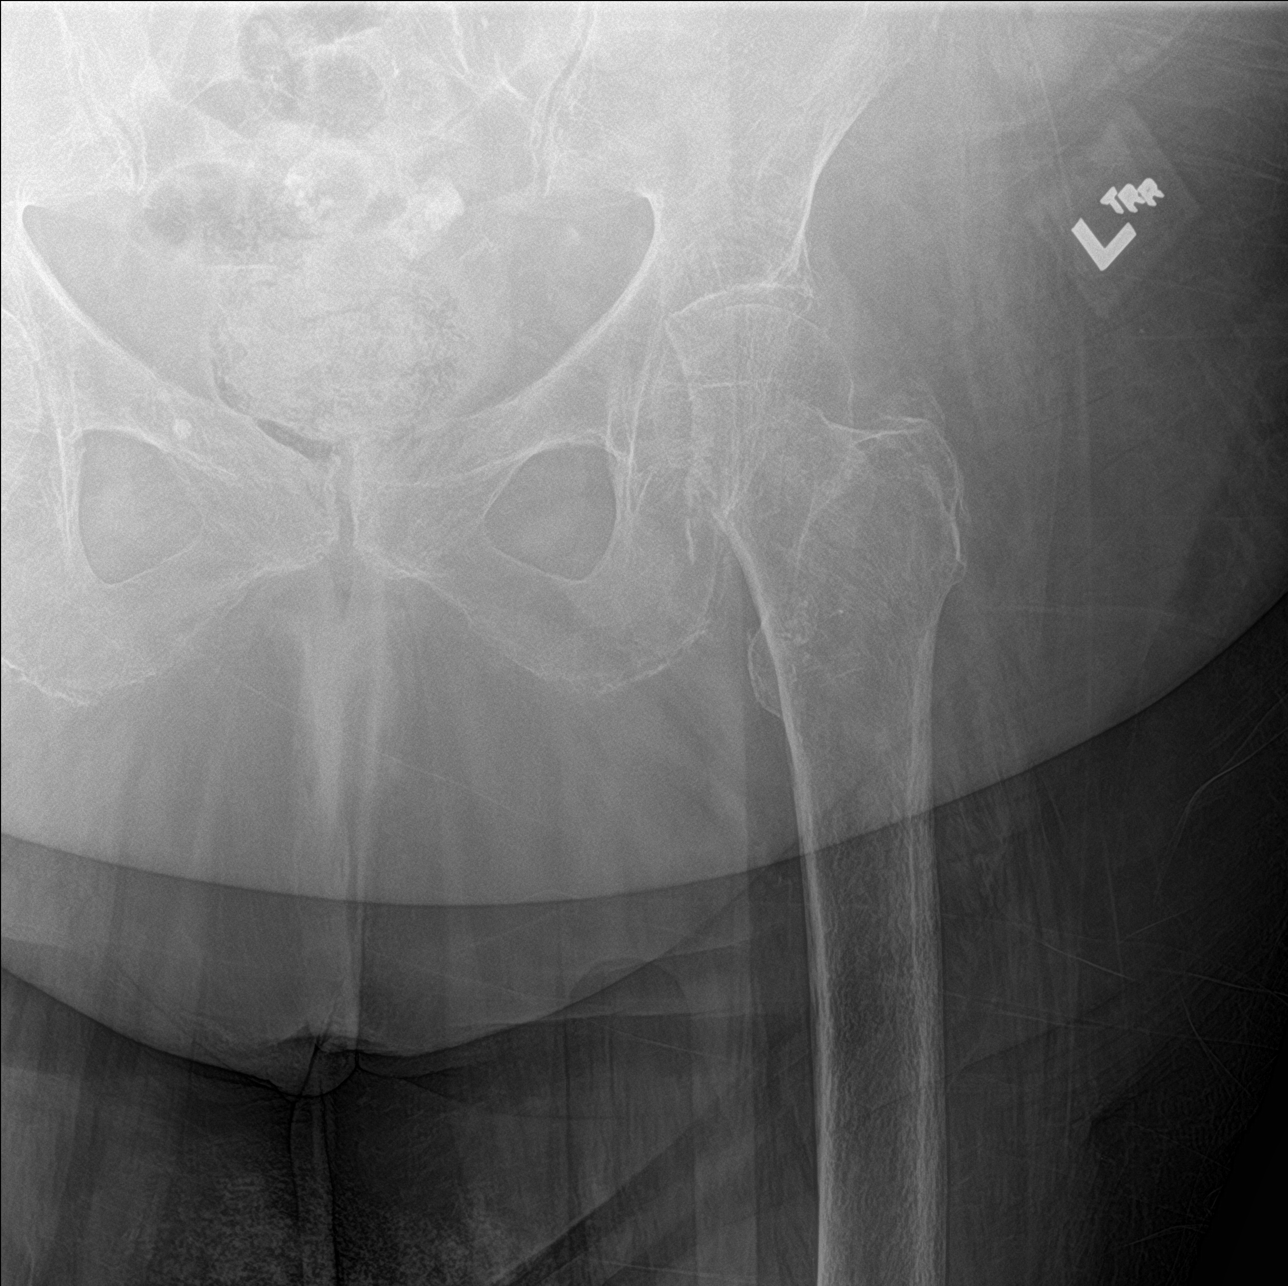

[4 of 4 positions shown; findings below may reference images not displayed]

FINDINGS: Two view radiograph of the left hip demonstrates an impacted
subcapital left femoral neck fracture. The femoral head is still
seated within the acetabulum. There is mild superimposed left hip
degenerative arthritis. The osseous structures are diffusely
osteopenic, however, the pelvis and sacrum appear intact. Limited
evaluation of the right hip demonstrates mild-to-moderate
degenerative arthritis.
IMPRESSION: Impacted subcapital left femoral neck fracture.

## 2022-03-19 IMAGING — RF DG HIP (WITH PELVIS) OPERATIVE*L*
1 series · 4 of 4 positions shown · non-contrast
Comparison: Preoperative radiograph 08/05/2020

CLINICAL DATA: Left cannulated hip pinning.

EXAM:
OPERATIVE LEFT HIP (WITH PELVIS IF PERFORMED)
TECHNIQUE: Fluoroscopic spot image(s) were submitted for interpretation
post-operatively.

[Series 1: dg x-ray · 0.20mm/px · 4 of 4 slices shown]
[im 1/4]
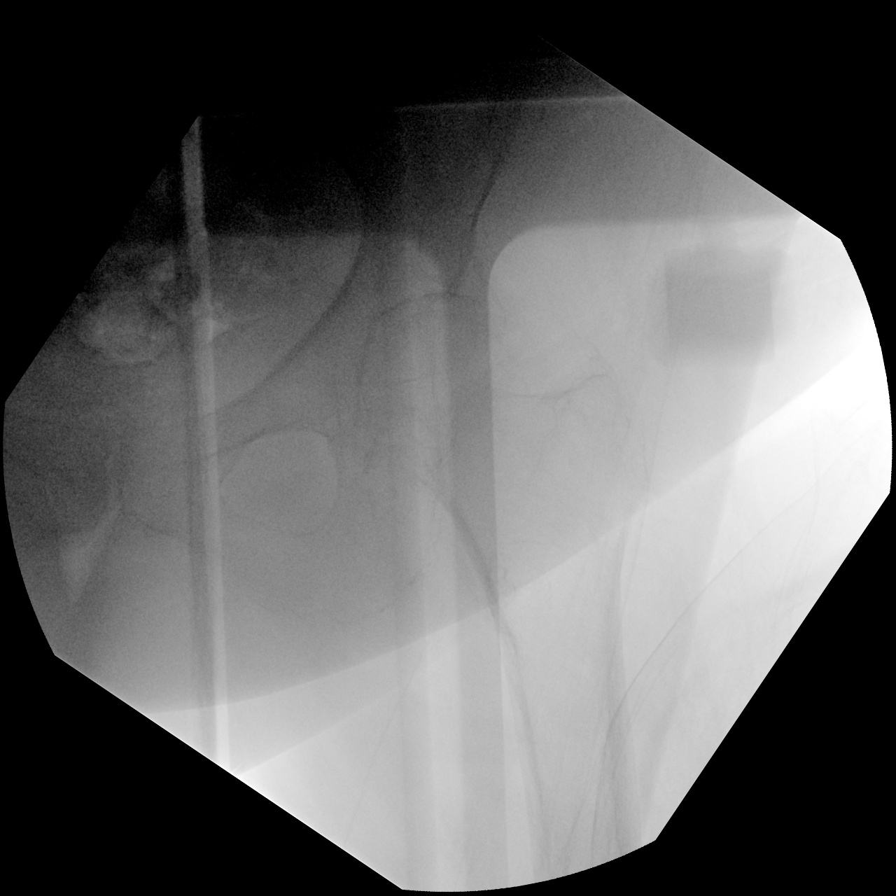
[im 2/4]
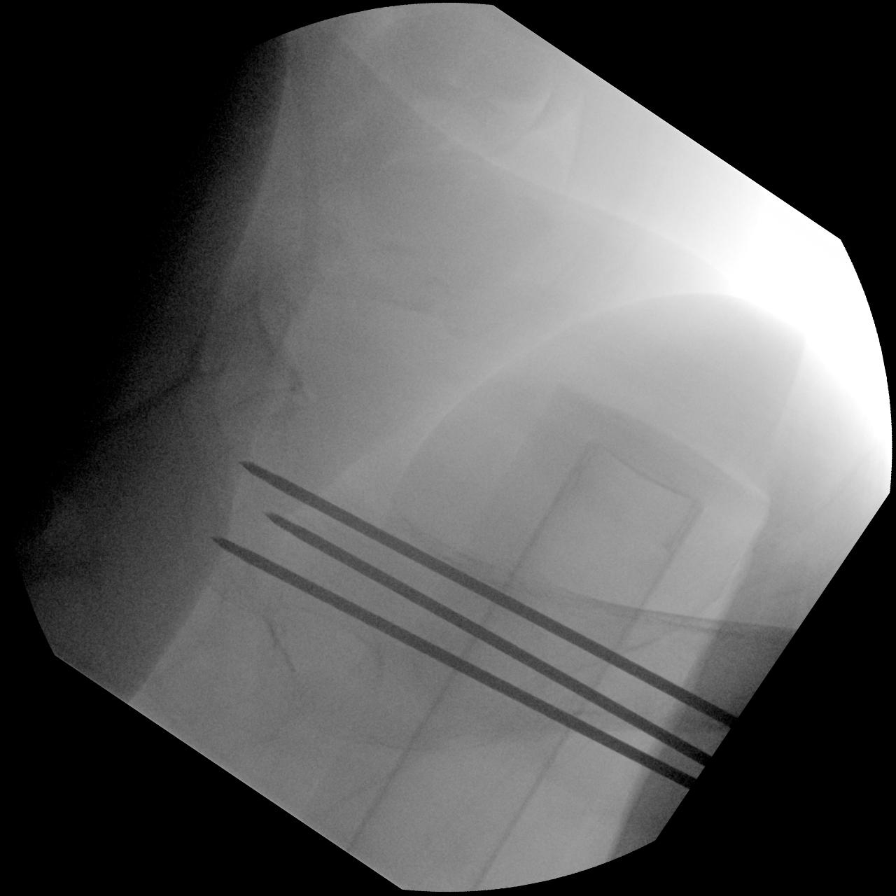
[im 3/4]
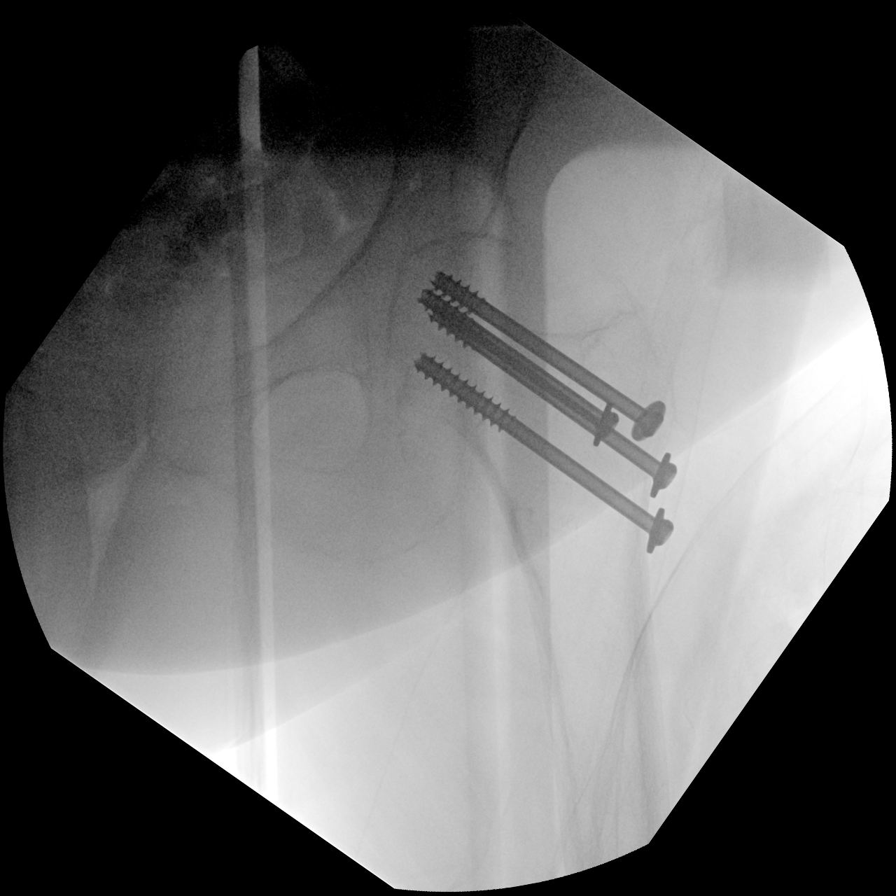
[im 4/4]
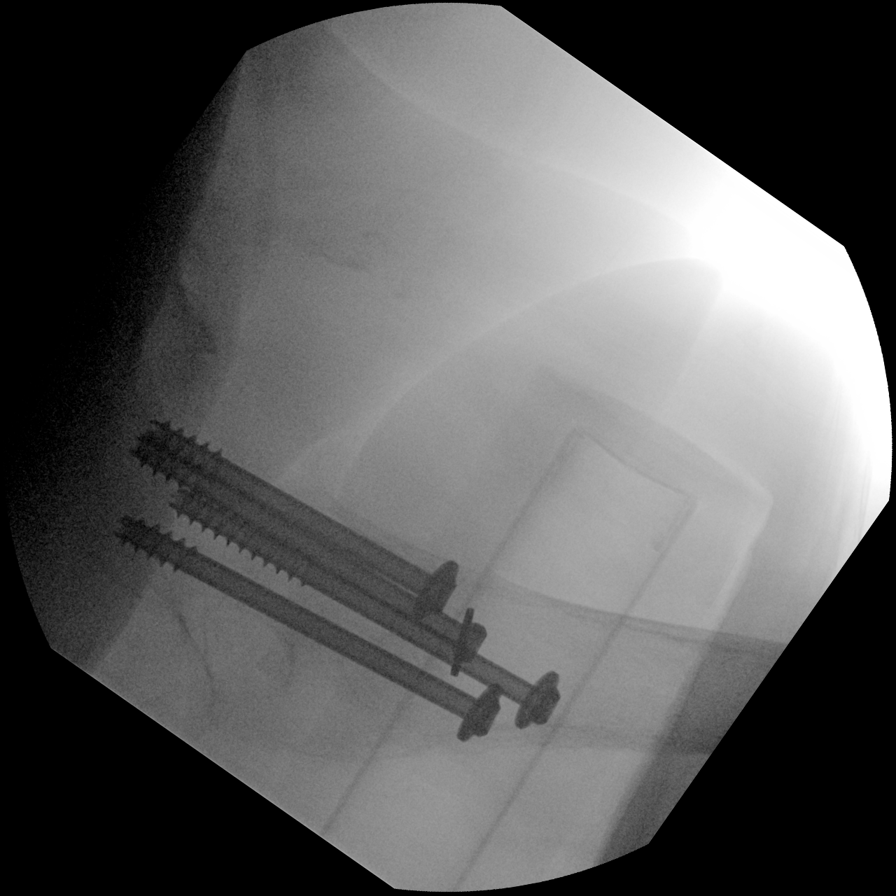

[4 of 4 positions shown; findings below may reference images not displayed]

FINDINGS: Four fluoroscopic spot views obtained in the operating room during
multi screw fixation of left proximal femur fracture. Total
fluoroscopy time 1 minutes 18 seconds.
IMPRESSION: Procedural fluoroscopy during pinning of left hip fracture.

## 2022-03-19 IMAGING — DX DG HIP (WITH OR WITHOUT PELVIS) 1V PORT*L*
2 series · 2 of 2 positions shown · non-contrast
Comparison: Portable exam 7745 hours compared to earlier
intraoperative images of 08/09/2020

CLINICAL DATA: Post LEFT hip pinning

EXAM:
DG HIP (WITH OR WITHOUT PELVIS) 1V PORT LEFT

[pelvis ap]
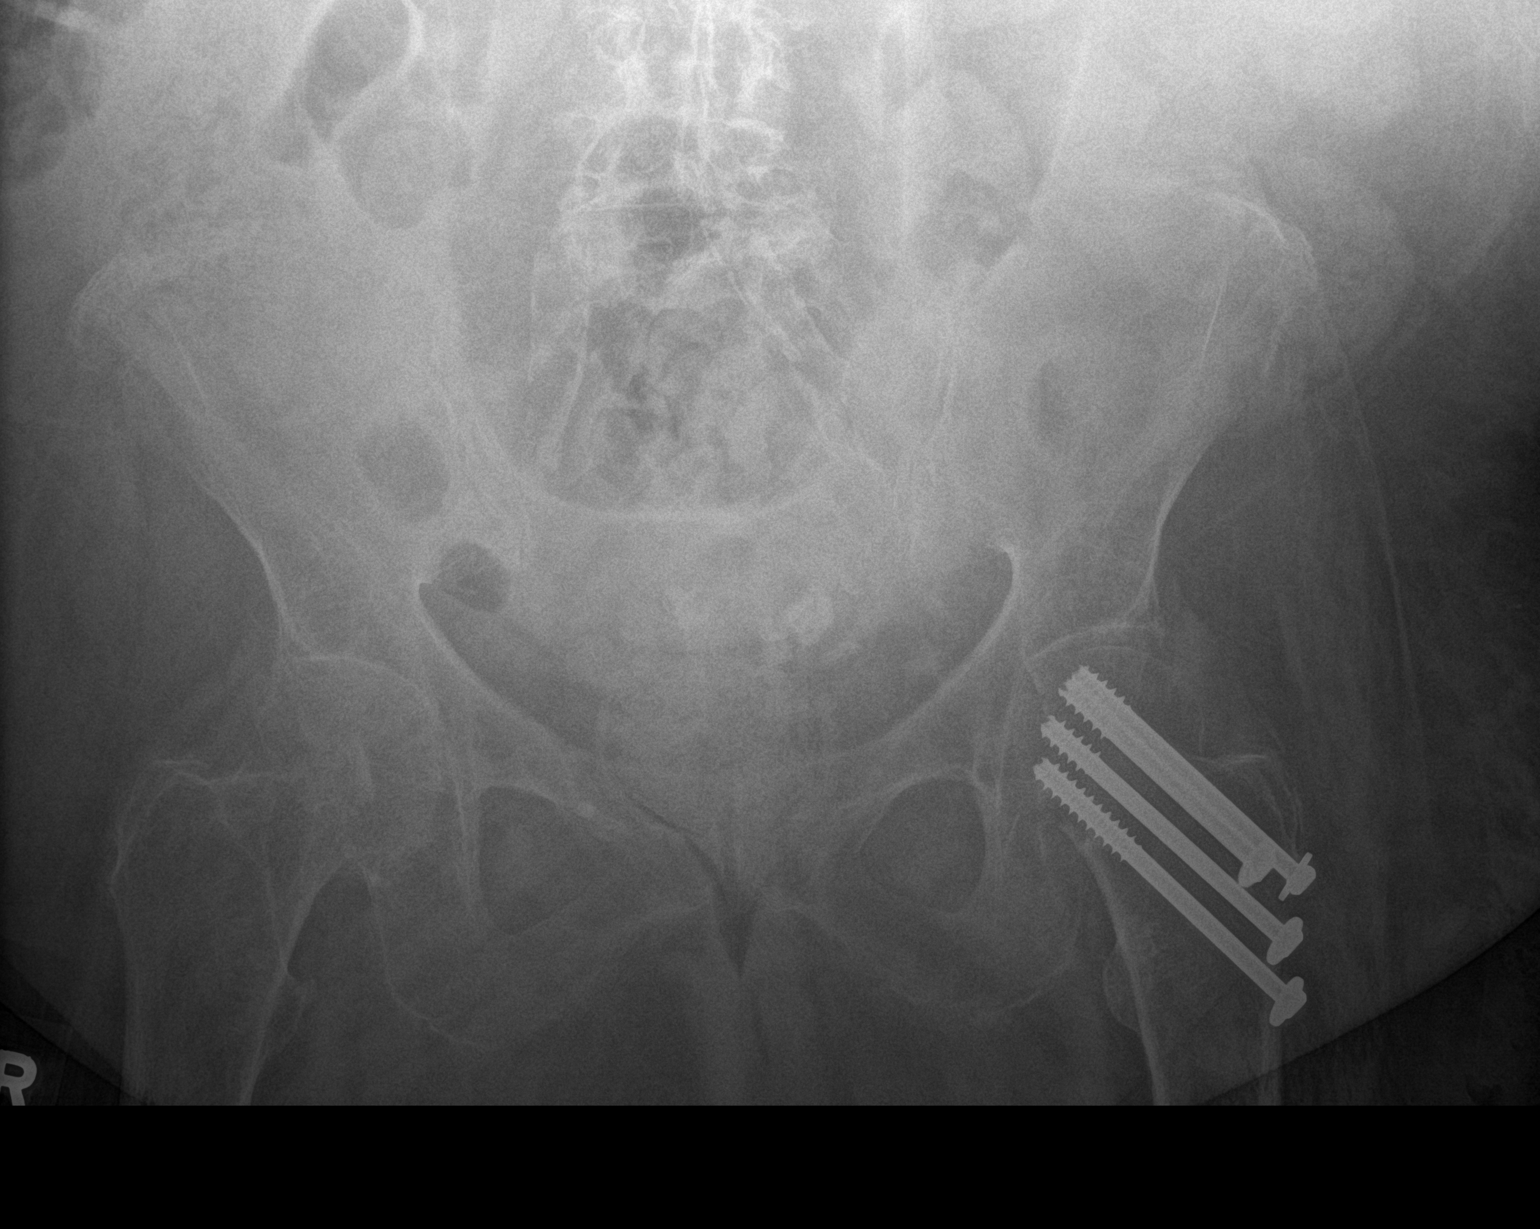

[hip lat]
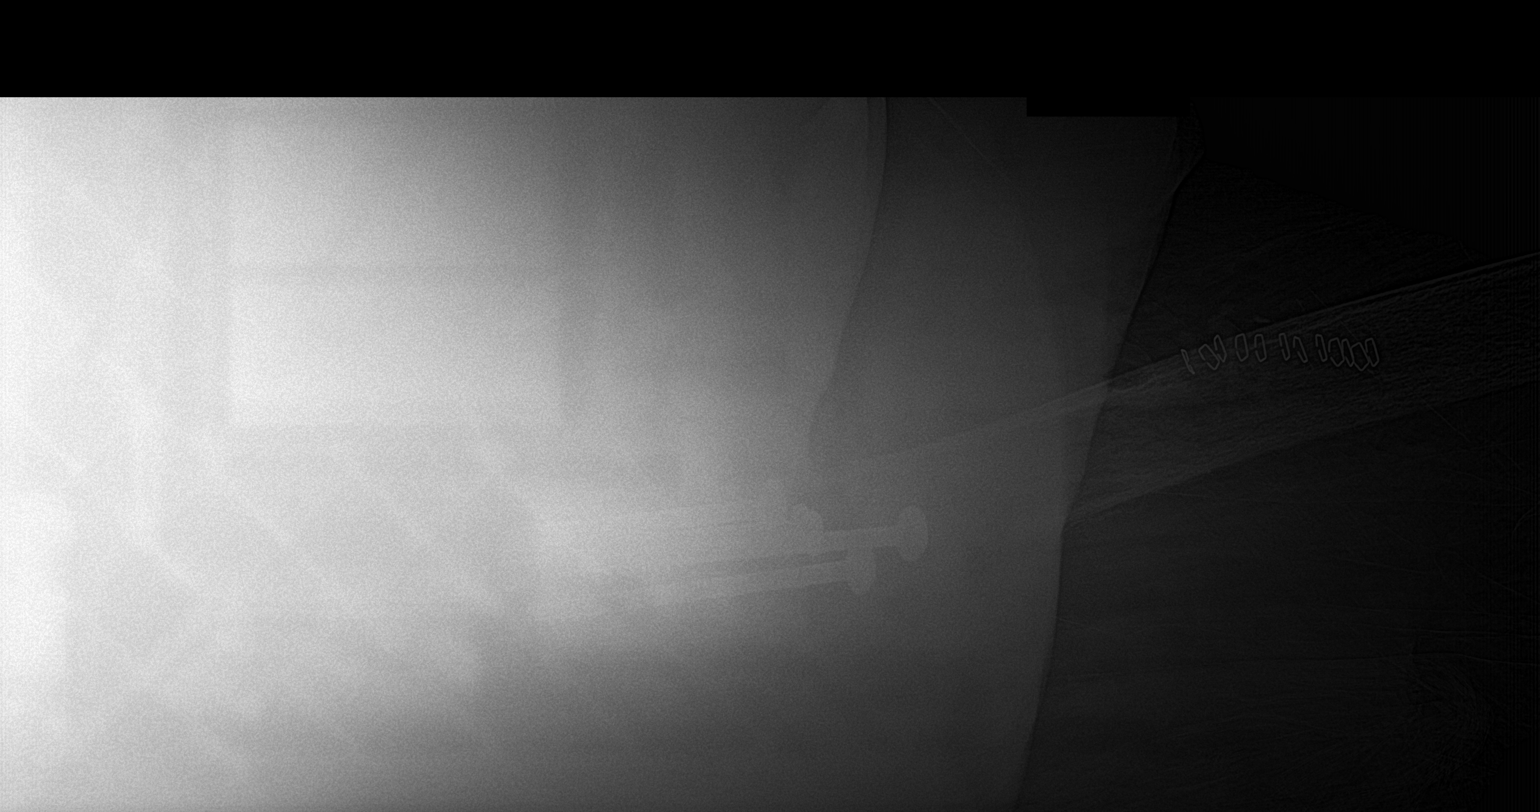

[2 of 2 positions shown; findings below may reference images not displayed]

FINDINGS: Four cannulated screws present at the proximal LEFT femur post
pinning of a LEFT femoral neck fracture.

No dislocation.

Marked osseous demineralization.

Advanced degenerative changes of the RIGHT hip joint.

Atherosclerotic calcifications of the iliac arteries.
IMPRESSION: Post pinning of LEFT femoral neck fracture.

## 2022-05-11 IMAGING — CT CT CHEST W/O CM
2 of 4 series · 15 of 36 positions shown, 18 images · non-contrast
Comparison: Chest radiograph, 08/05/2020, CT chest, 03/23/2018

CLINICAL DATA: Right upper lobe lung mass

EXAM:
CT CHEST WITHOUT CONTRAST
TECHNIQUE: Multidetector CT imaging of the chest was performed following the
standard protocol without IV contrast.

[Series 2: thorax · axial · 0.66mm/px · z∈[-154,+94]mm · 12 of 146 slices shown, 15 images]
[im 11/146  mediastinal]
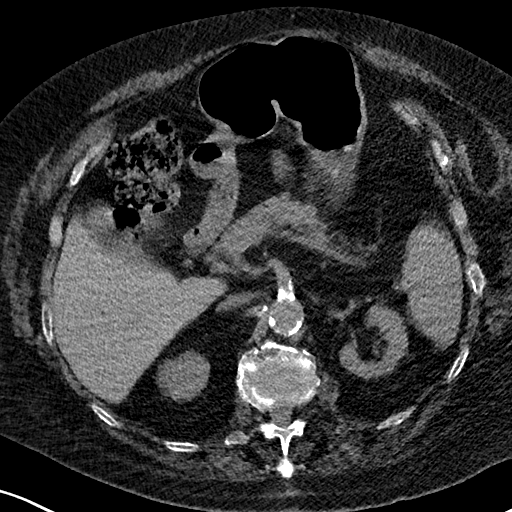
[im 11/146  lung]
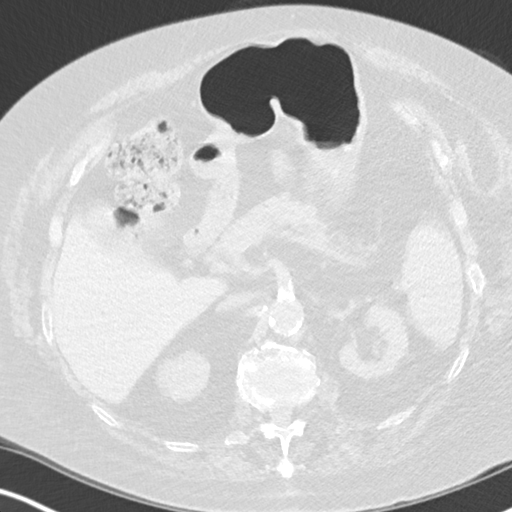
[im 21/146  lung]
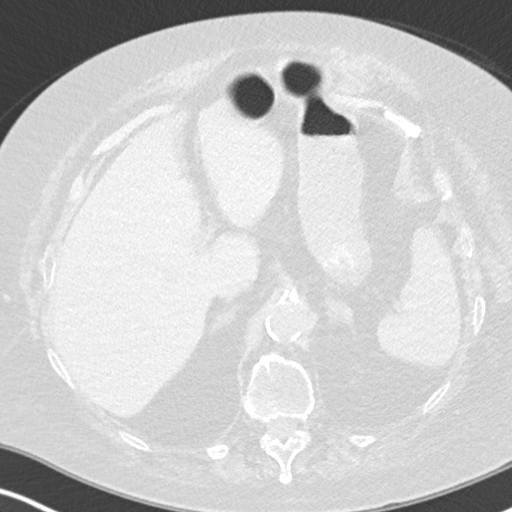
[im 32/146  lung]
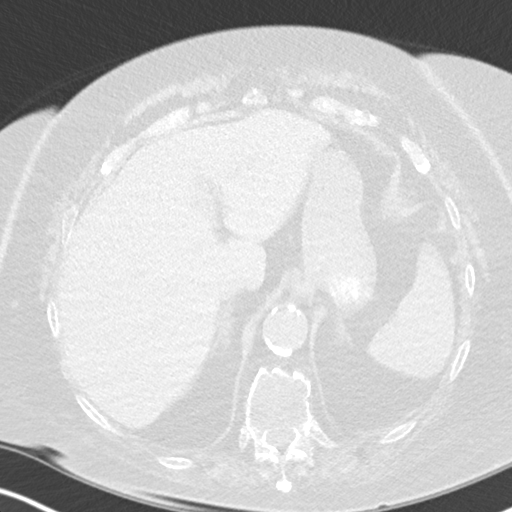
[im 42/146  lung]
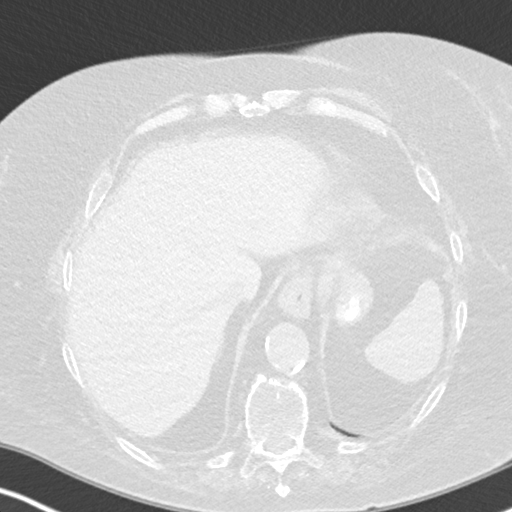
[im 52/146  mediastinal]
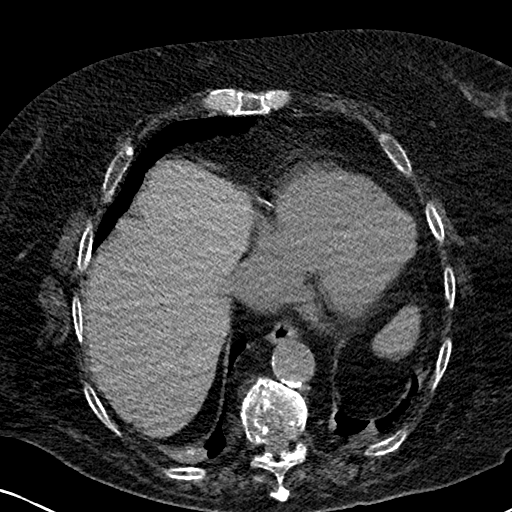
[im 52/146  lung]
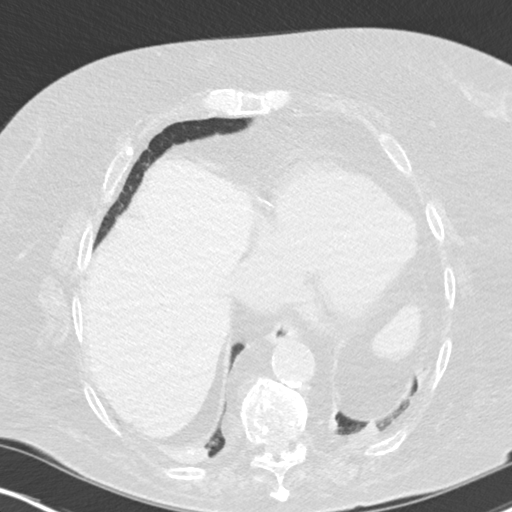
[im 63/146  lung]
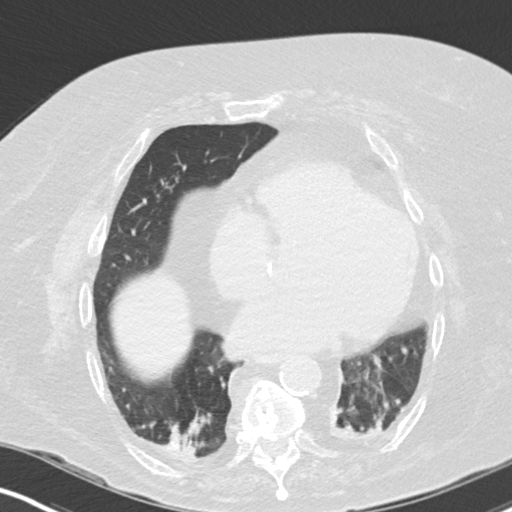
[im 83/146  lung]
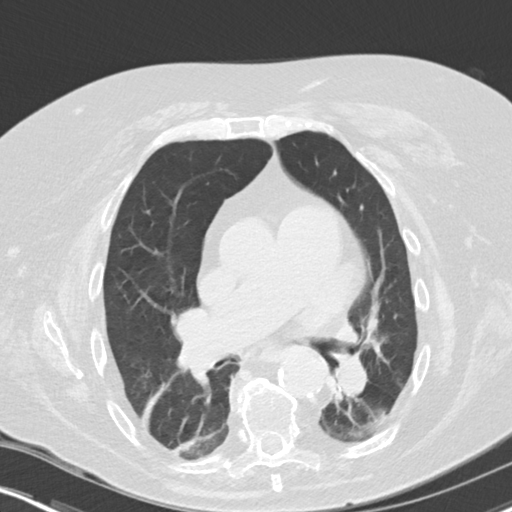
[im 94/146  lung]
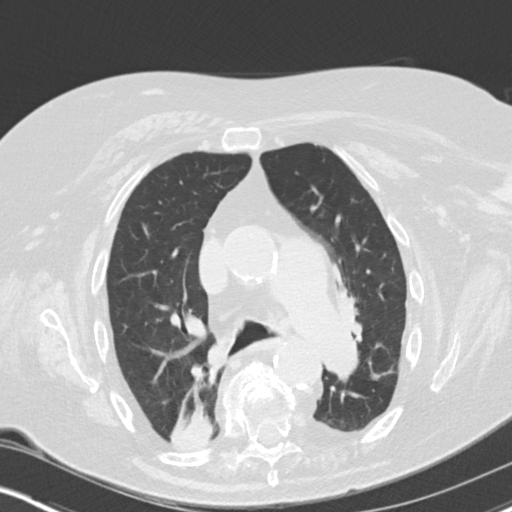
[im 104/146  mediastinal]
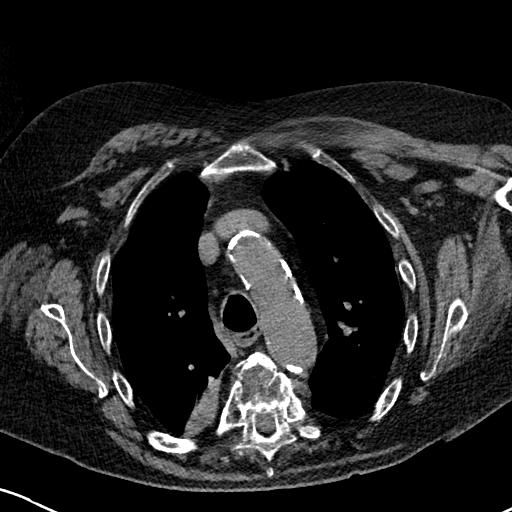
[im 104/146  lung]
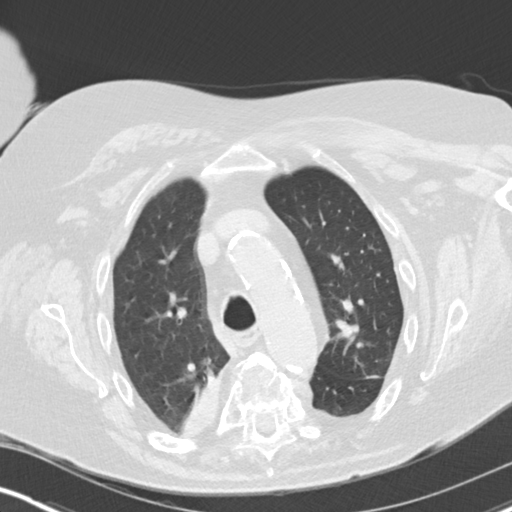
[im 114/146  lung]
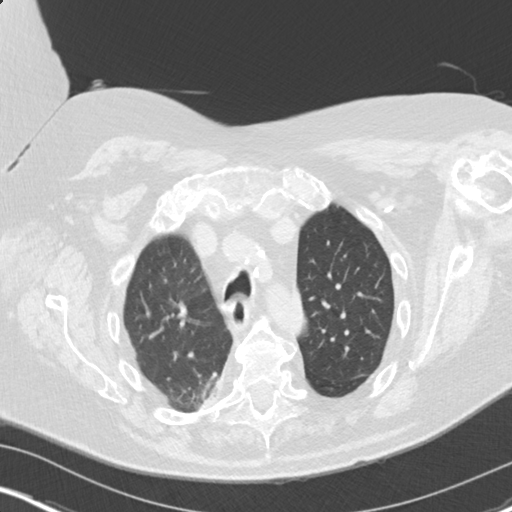
[im 125/146  lung]
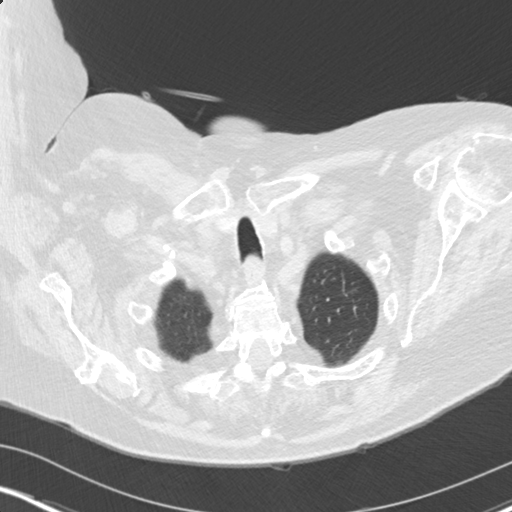
[im 135/146  lung]
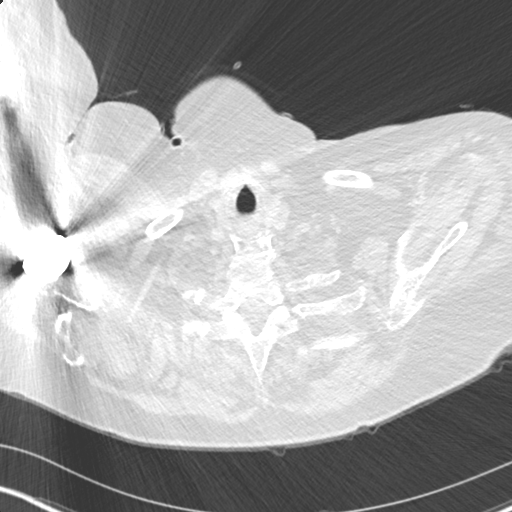

[Series 5: coronal · coronal · 0.61mm/px · 3 of 155 slices shown]
[im 31/155  lung]
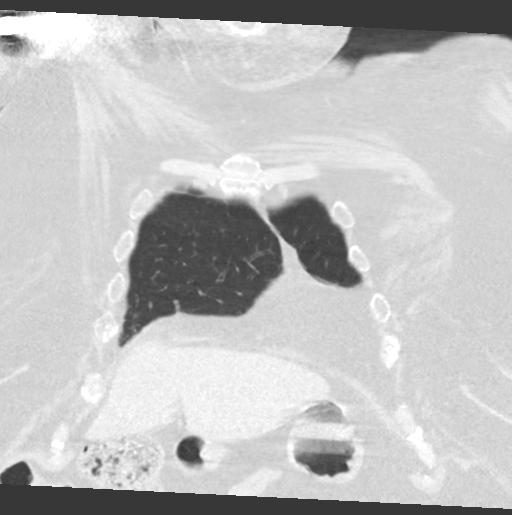
[im 62/155  lung]
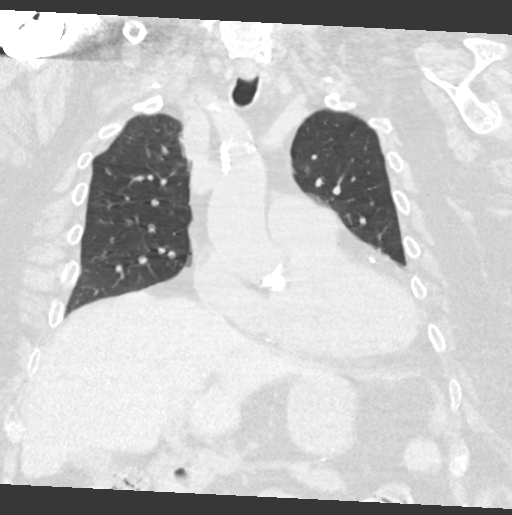
[im 93/155  lung]
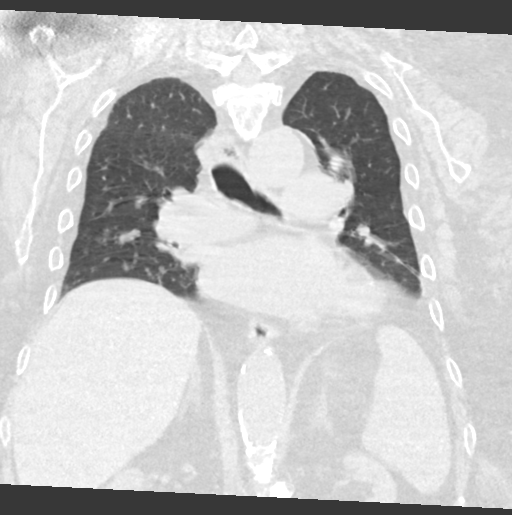

[15 of 36 positions shown; findings below may reference images not displayed]

FINDINGS: Cardiovascular: Aortic atherosclerosis. Cardiomegaly. Three-vessel
coronary artery calcifications. Enlargement of the main pulmonary
artery measuring up to 4.4 cm. No pericardial effusion.

Mediastinum/Nodes: No enlarged mediastinal, hilar, or axillary lymph
nodes. Thyroid gland, trachea, and esophagus demonstrate no
significant findings.

Lungs/Pleura: Bandlike bibasilar scarring and or atelectasis.
Dependent atelectasis or consolidation of the right upper lobe,
which is not significantly changed in appearance in comparison to
prior examination dated 03/23/2018. No pleural effusion or
pneumothorax.

Upper Abdomen: No acute abnormality.

Musculoskeletal: No chest wall mass or suspicious bone lesions
identified. Status post right shoulder arthroplasty. Ankylosis of
the thoracic spine.
IMPRESSION: 1. Dependent atelectasis or consolidation of the right upper lobe,
which is not significantly changed in appearance in comparison to
prior examination dated 03/23/2018. Findings are consistent with
bland sequelae of prior infection or aspiration.
2. Additional bandlike bibasilar scarring and or atelectasis of the
bilateral lung bases.
3. Cardiomegaly and coronary artery disease.
4. Enlargement of the main pulmonary artery, as can be seen in
pulmonary hypertension.
5. Aortic Atherosclerosis ([id number]).

## 2022-07-23 IMAGING — DX DG CHEST 1V PORT
1 series · 1 of 1 positions shown · non-contrast
Comparison: 08/05/2020, 01/17/2020 CT 10/01/2020

CLINICAL DATA: Shortness of breath

EXAM:
PORTABLE CHEST 1 VIEW

[chest ap]
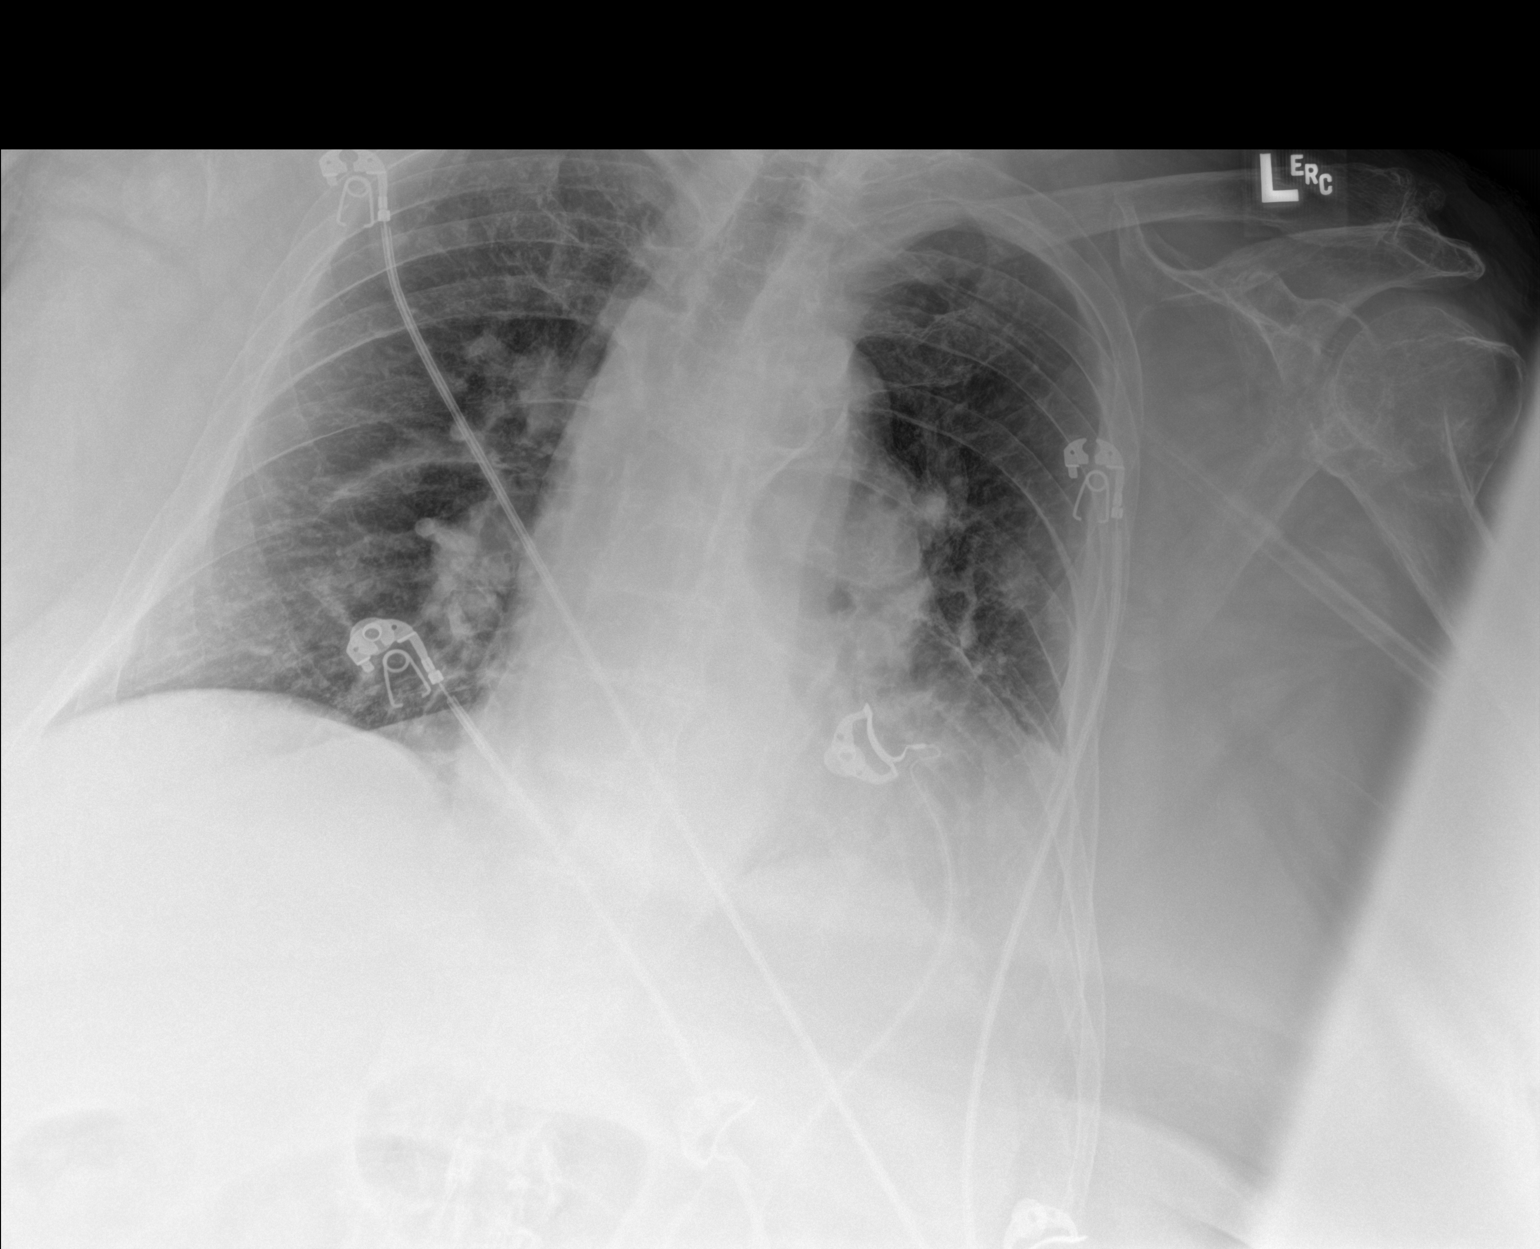

[1 of 1 positions shown; findings below may reference images not displayed]

FINDINGS: Rotated patient. The right lung is grossly clear. Chronic scarring
at the left lung base. Enlarged cardiomediastinal silhouette with
aortic atherosclerosis. No pneumothorax.
IMPRESSION: Cardiomegaly without overt failure. Probable chronic scarring left
lung base

## 2022-09-07 IMAGING — CT CT ABD-PELV W/O CM
2 of 4 series · 14 of 42 positions shown, 18 images · non-contrast
Comparison: 01/16/2020 CT abdomen/pelvis.

CLINICAL DATA: Fall backwards with back and left hip pain. Recent
left hip surgery.

EXAM:
CT ABDOMEN AND PELVIS WITHOUT CONTRAST
TECHNIQUE: Multidetector CT imaging of the abdomen and pelvis was performed
following the standard protocol without IV contrast.

[Series 3: routine abd/pel wo · axial · 0.89mm/px · z∈[-796,-366]mm · 11 of 98 slices shown, 15 images]
[im 8/98  soft-tissue]
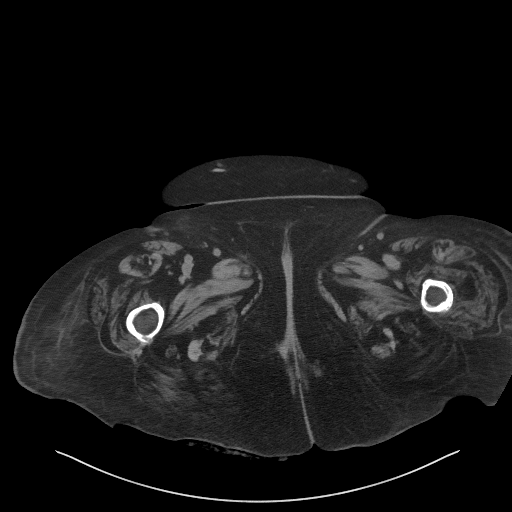
[im 8/98  bone]
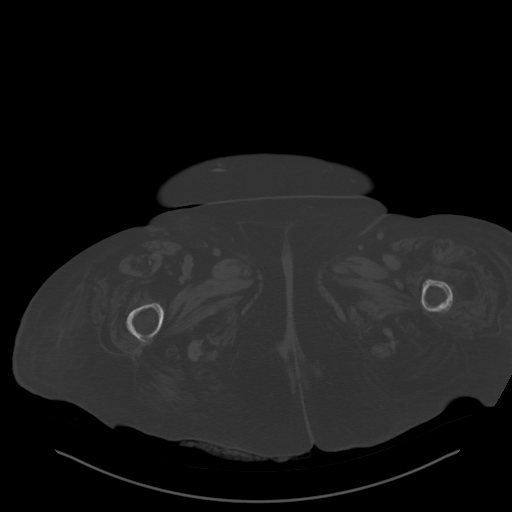
[im 20/98  soft-tissue]
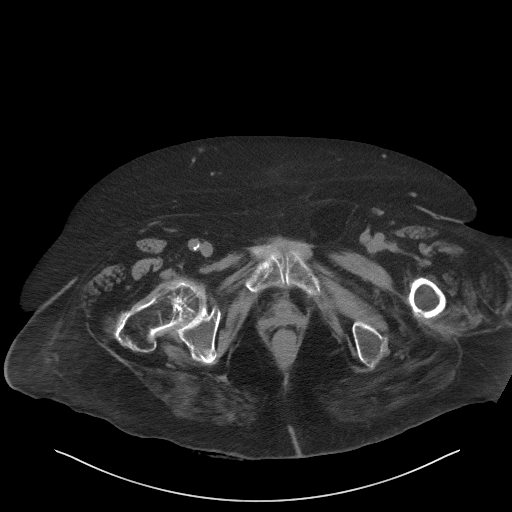
[im 28/98  soft-tissue]
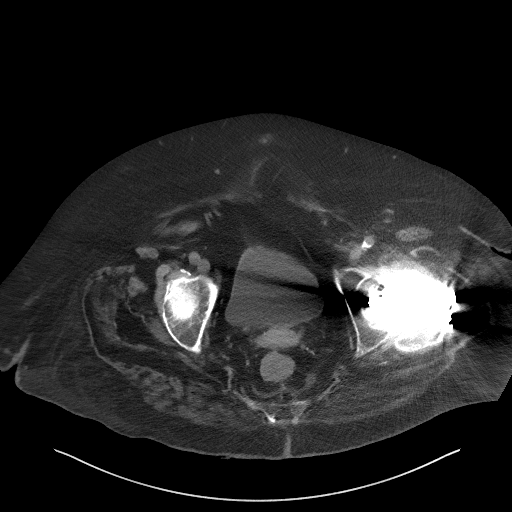
[im 39/98  soft-tissue]
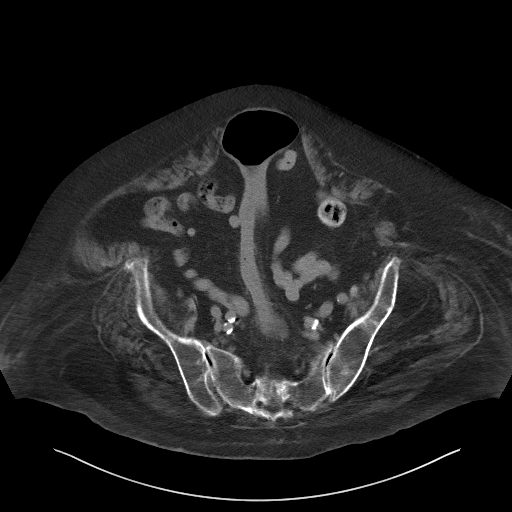
[im 51/98  soft-tissue]
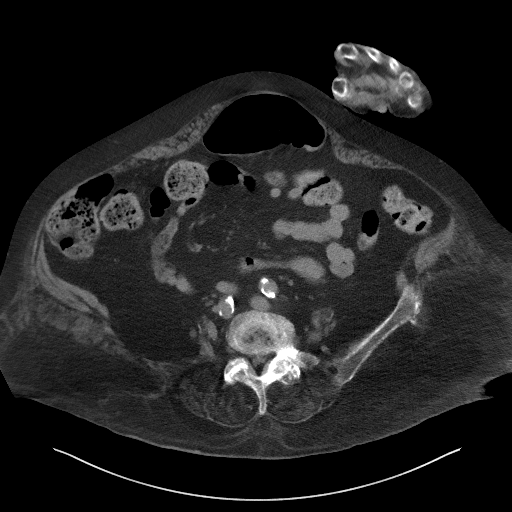
[im 59/98  soft-tissue]
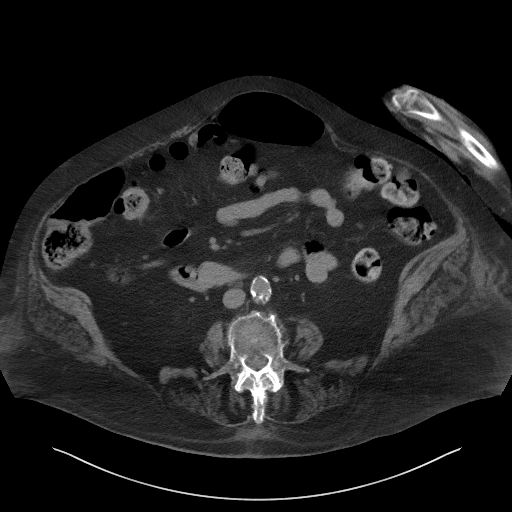
[im 70/98  soft-tissue]
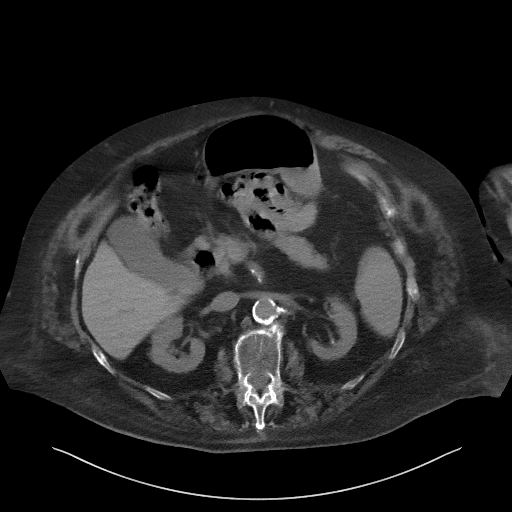
[im 82/98  soft-tissue]
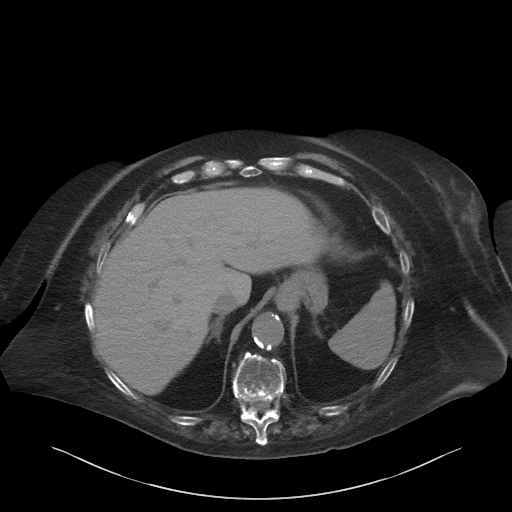
[im 82/98  lung]
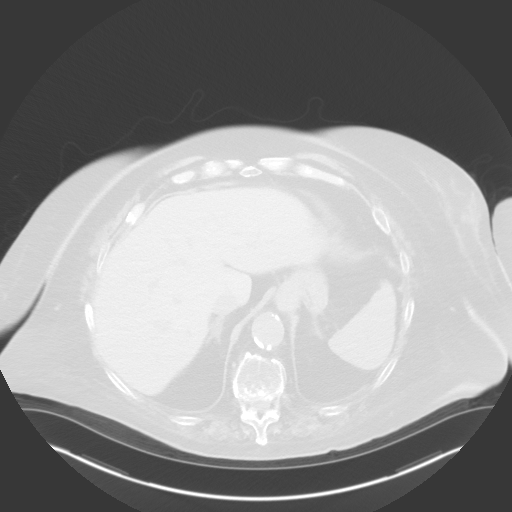
[im 86/98  lung]
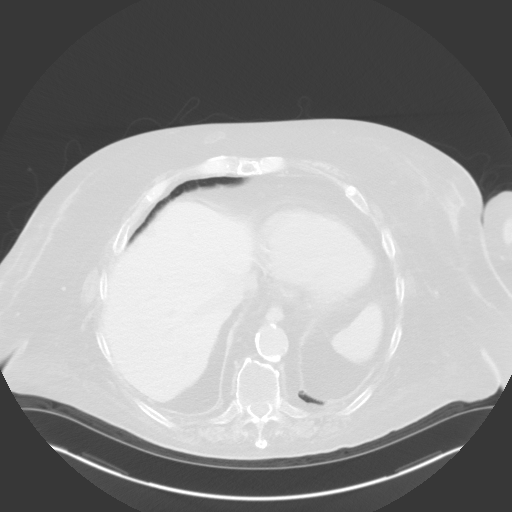
[im 90/98  soft-tissue]
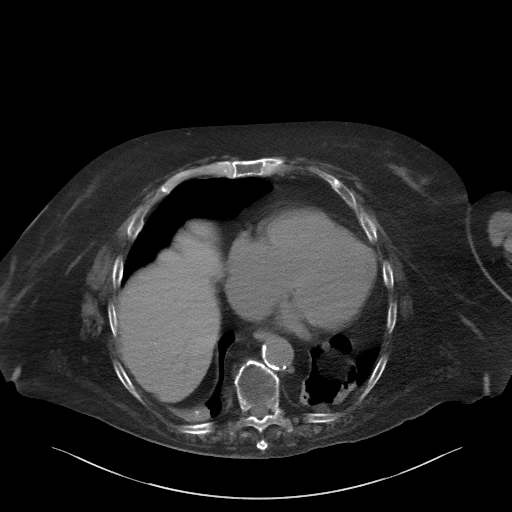
[im 90/98  lung]
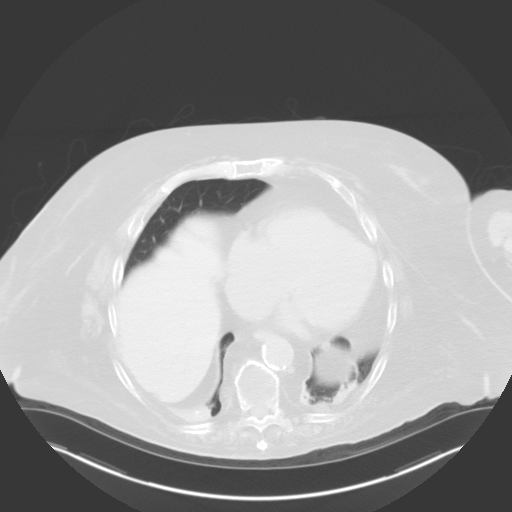
[im 90/98  bone]
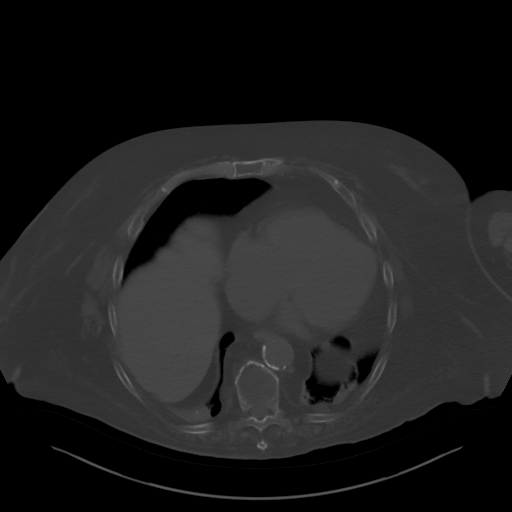
[im 94/98  lung]
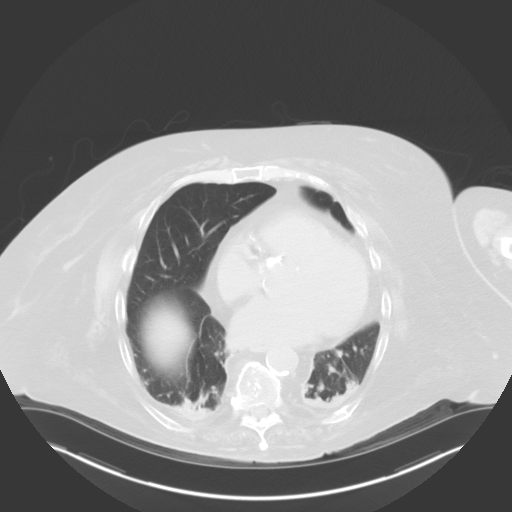

[Series 6: coronal st · coronal · 0.97mm/px · 3 of 117 slices shown]
[im 30/117  soft-tissue]
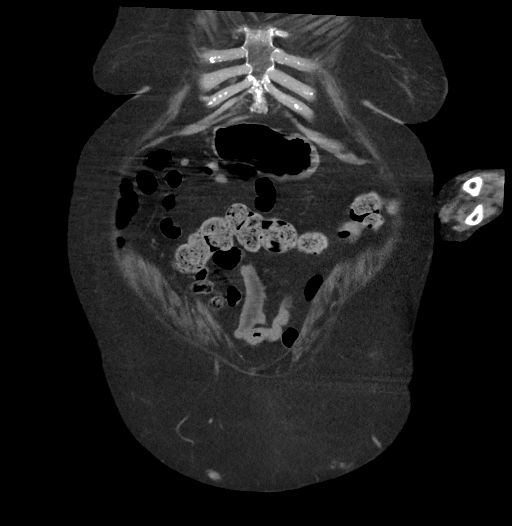
[im 59/117  soft-tissue]
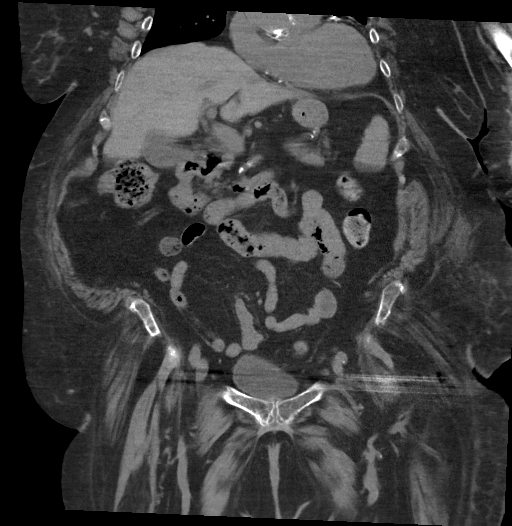
[im 88/117  soft-tissue]
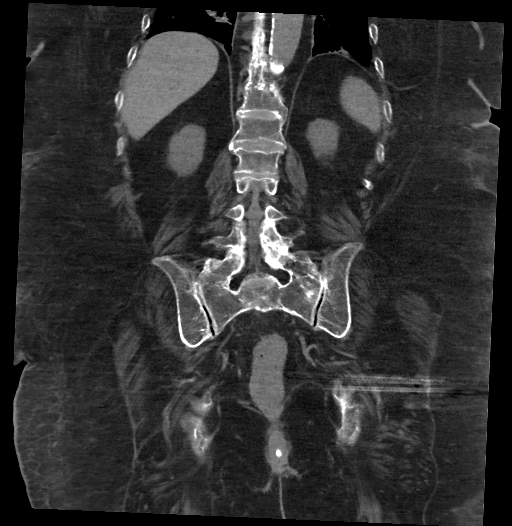

[14 of 42 positions shown; findings below may reference images not displayed]

FINDINGS: Lower chest: Mild-to-moderate hypoventilatory changes at the
dependent lung bases. Coronary atherosclerosis.

Hepatobiliary: Normal liver size. No liver mass. Normal gallbladder
with no radiopaque cholelithiasis. No biliary ductal dilatation.

Pancreas: Normal, with no mass or duct dilation.

Spleen: Normal size. No mass.

Adrenals/Urinary Tract: Stable adrenals without discrete adrenal
nodules. Punctate nonobstructing 1 mm interpolar left renal stone.
No hydronephrosis. No contour deforming renal masses. Normal
bladder.

Stomach/Bowel: Small hiatal hernia. Otherwise normal nondistended
stomach. Normal caliber small bowel with no small bowel wall
thickening. Appendectomy. Normal large bowel with no diverticulosis,
large bowel wall thickening or pericolonic fat stranding.

Vascular/Lymphatic: Atherosclerotic nonaneurysmal abdominal aorta.
No pathologically enlarged lymph nodes in the abdomen or pelvis.

Reproductive: Coarsely calcified small uterine fibroids are
unchanged. No adnexal masses.

Other: No pneumoperitoneum, ascites or focal fluid collection.

Musculoskeletal: No aggressive appearing focal osseous lesions.
Diffuse osteopenia. Nondisplaced superior and inferior right pubic
rami fractures extending into the right ischium (series 6/image 79),
appearing acute. Multiple pins transfixing comminuted impacted left
femoral neck fracture in near-anatomic alignment. Chronic mild L2,
moderate L3, severe L4 and moderate L5 vertebral compression
fractures, unchanged since 01/16/2020. Transitional S1 level. Marked
lumbar spondylosis.
IMPRESSION: 1. Nondisplaced acute superior and inferior right pubic rami
fractures extending into the right ischium.
2. Diffuse osteopenia. Chronic multilevel lumbar vertebral
compression fractures.
3. Near-anatomic alignment of comminuted impacted left femoral neck
fracture status post ORIF with intact appearing pins.
4. Punctate nonobstructing left nephrolithiasis.
5. Small hiatal hernia.
6. Aortic Atherosclerosis ([id number]).

## 2022-09-07 IMAGING — CT CT HEAD W/O CM
3 series · 16 of 47 positions shown, 19 images · non-contrast
Comparison: 03/01/2018

CLINICAL DATA: Fall

EXAM:
CT HEAD WITHOUT CONTRAST
TECHNIQUE: Contiguous axial images were obtained from the base of the skull
through the vertex without intravenous contrast.

[Series 3: head wo · axial · 0.42mm/px · z∈[-126,-1]mm · 10 of 31 slices shown, 13 images]
[im 3/31  brain]
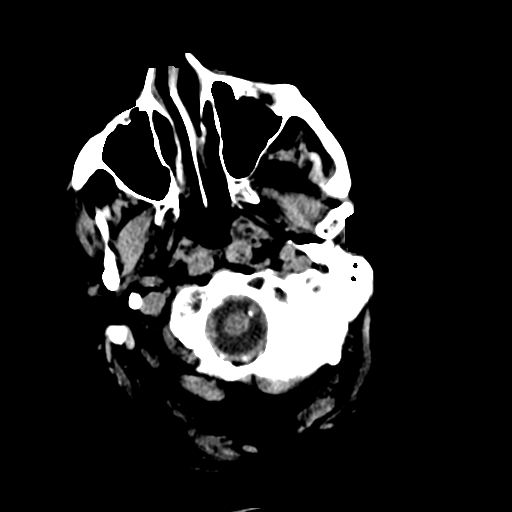
[im 3/31  bone]
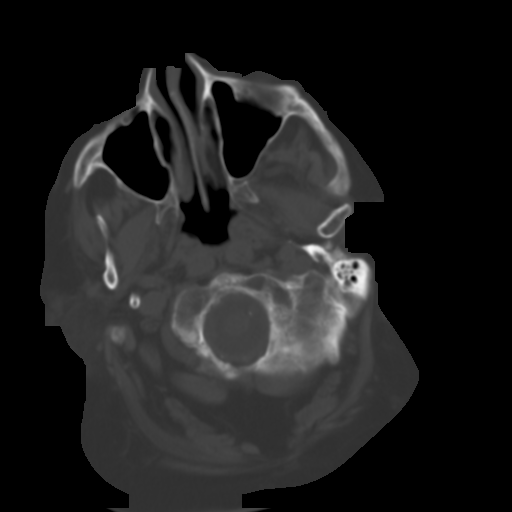
[im 6/31  brain]
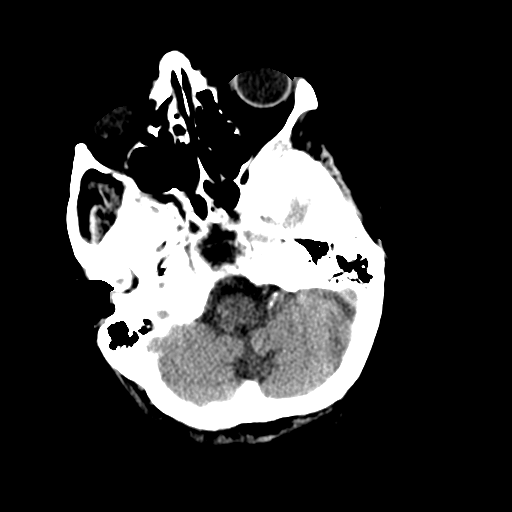
[im 9/31  brain]
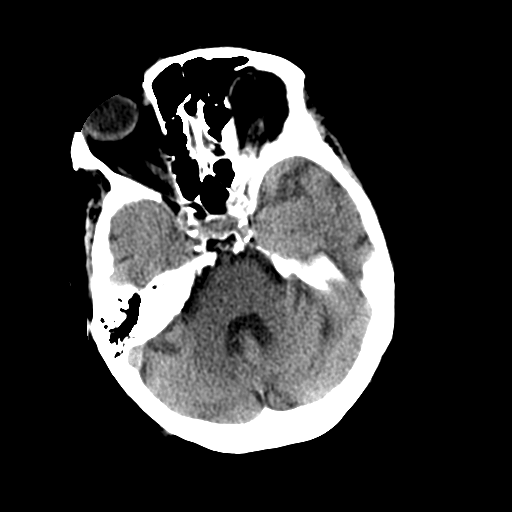
[im 11/31  brain]
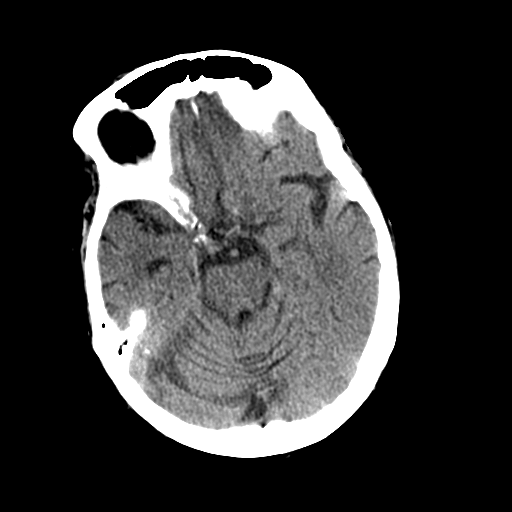
[im 14/31  brain]
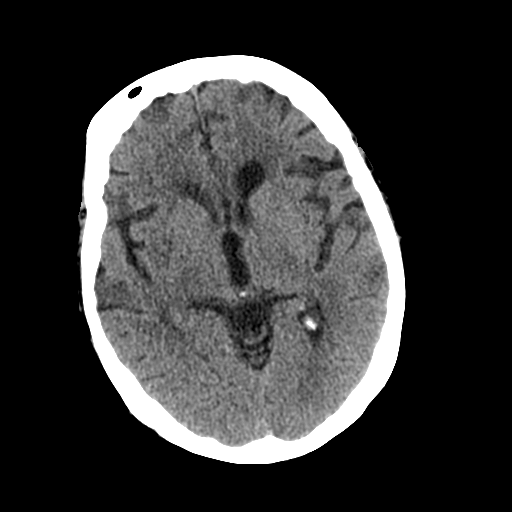
[im 14/31  bone]
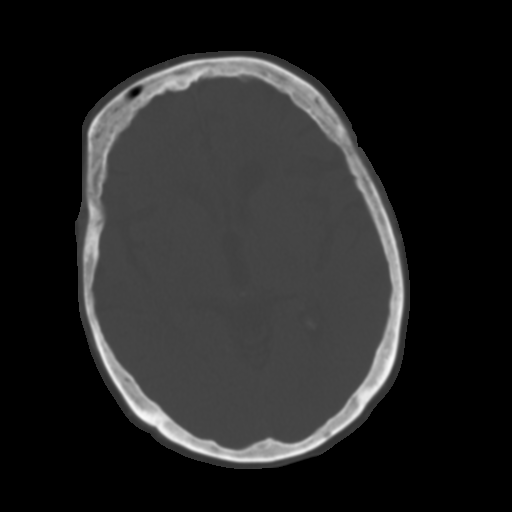
[im 17/31  brain]
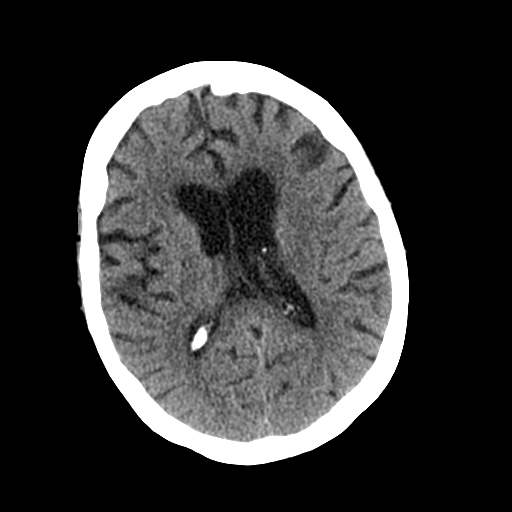
[im 20/31  brain]
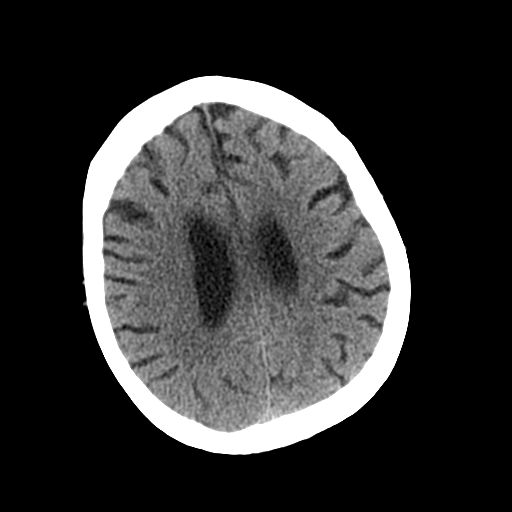
[im 23/31  brain]
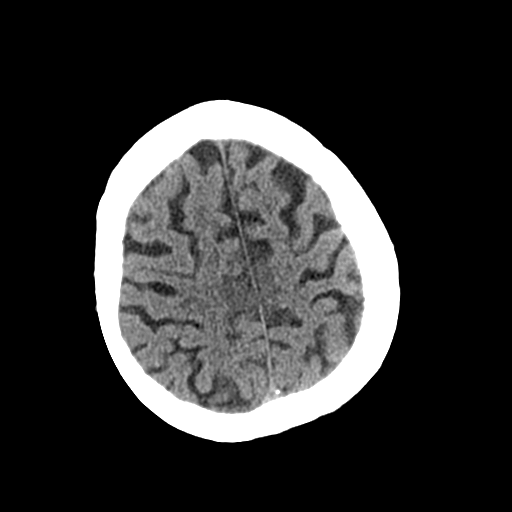
[im 25/31  brain]
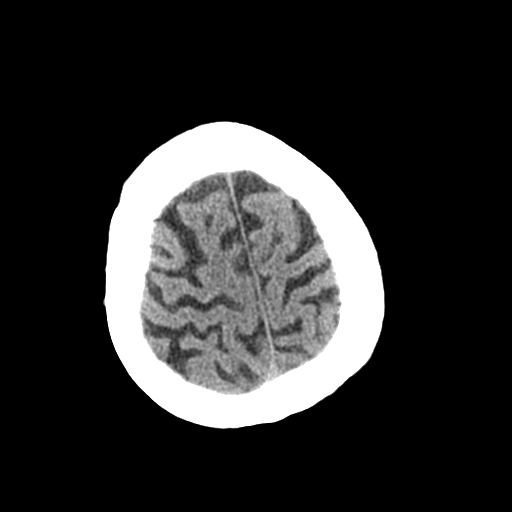
[im 25/31  bone]
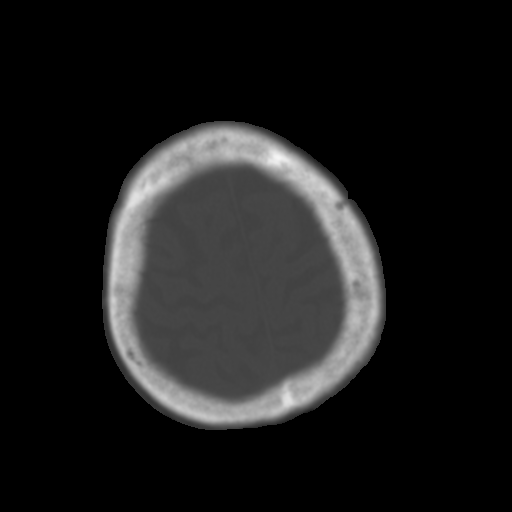
[im 28/31  brain]
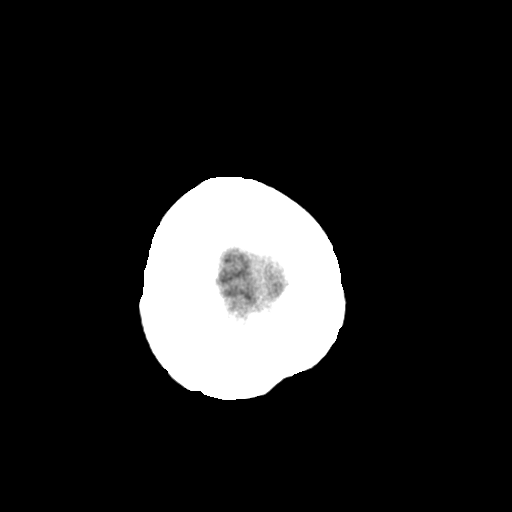

[Series 4: coronal soft tissue · coronal · 0.32mm/px · 3 of 65 slices shown]
[im 22/65  brain]
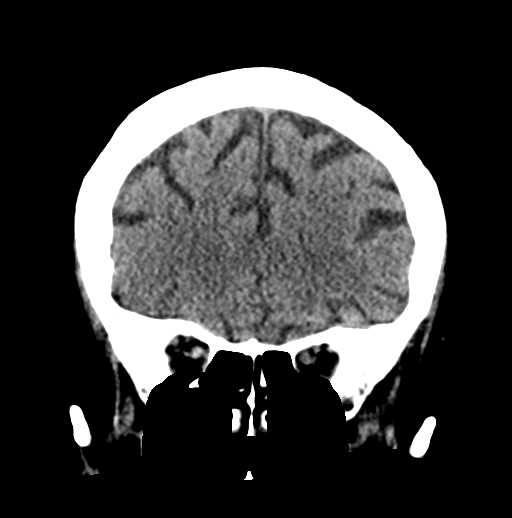
[im 29/65  brain]
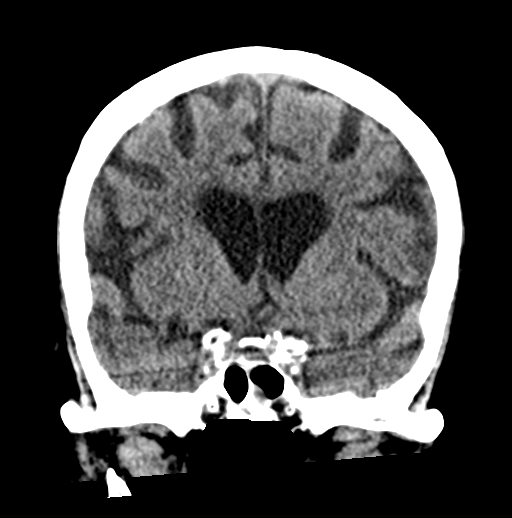
[im 36/65  brain]
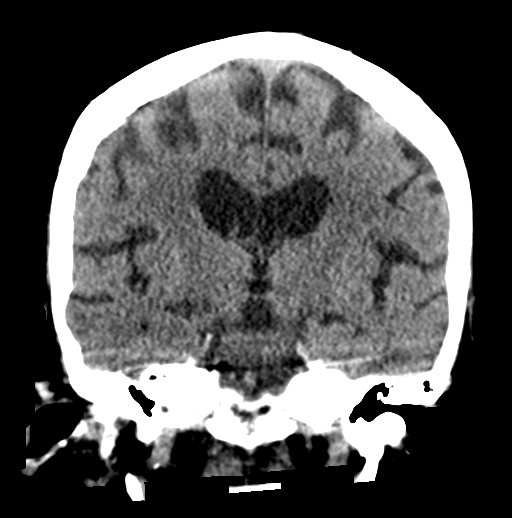

[Series 5: sagittal soft tissue · sagittal · 0.32mm/px · 3 of 52 slices shown]
[im 18/52  brain]
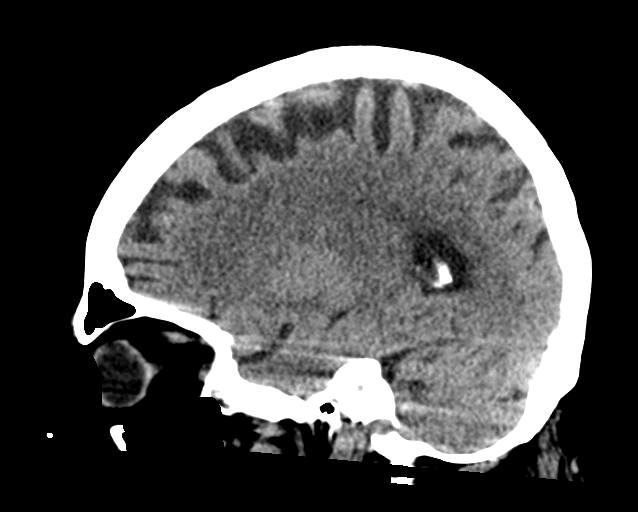
[im 26/52  brain]
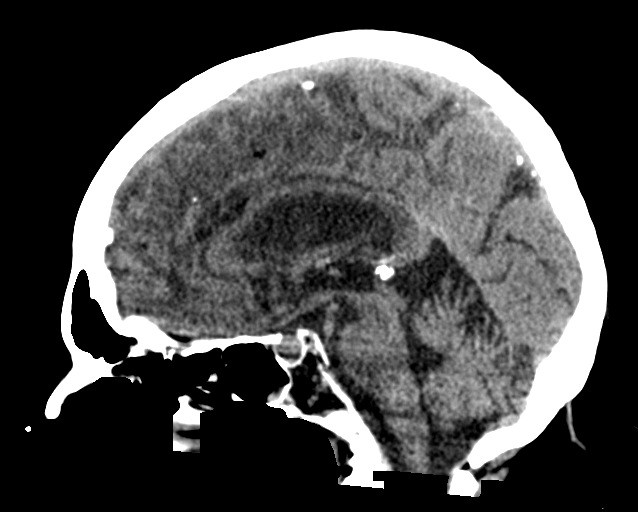
[im 35/52  brain]
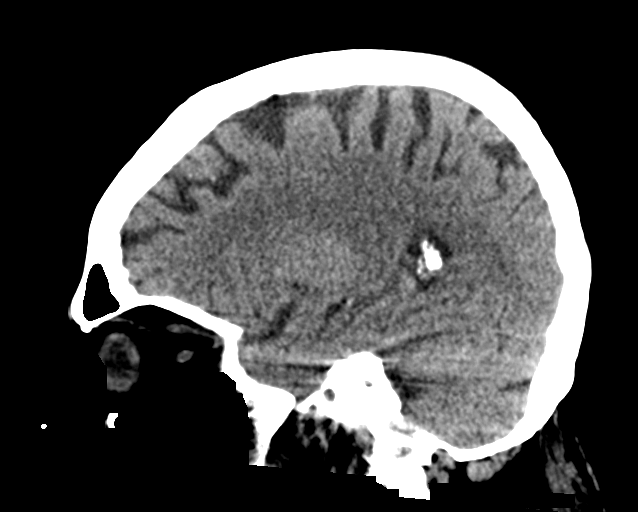

[16 of 47 positions shown; findings below may reference images not displayed]

FINDINGS: Brain: There is no acute intracranial hemorrhage, mass effect, or
edema. Gray-white differentiation is preserved. There is no
extra-axial fluid collection. Prominence of the ventricles and sulci
reflects mild generalized parenchymal volume loss. Patchy
hypoattenuation in the supratentorial white matter is nonspecific
but likely reflects stable mild chronic microvascular ischemic
changes.

Vascular: There is atherosclerotic calcification at the skull base.

Skull: Calvarium is unremarkable.

Sinuses/Orbits: Mild patchy paranasal sinus mucosal thickening. No
acute orbital abnormality.

Other: Mastoid air cells are clear.
IMPRESSION: No acute intracranial abnormality.
